# Patient Record
Sex: Female | Born: 1950
Health system: Southern US, Community
[De-identification: ages and names within clinical notes are randomized; demographics above are authoritative.]

## PROBLEM LIST (undated history)

## (undated) ENCOUNTER — Inpatient Hospital Stay: Admission: EM | Payer: Self-pay | Source: Home / Self Care

## (undated) DIAGNOSIS — E669 Obesity, unspecified: Secondary | ICD-10-CM

## (undated) DIAGNOSIS — E1140 Type 2 diabetes mellitus with diabetic neuropathy, unspecified: Secondary | ICD-10-CM

## (undated) DIAGNOSIS — F329 Major depressive disorder, single episode, unspecified: Secondary | ICD-10-CM

## (undated) DIAGNOSIS — E1142 Type 2 diabetes mellitus with diabetic polyneuropathy: Secondary | ICD-10-CM

## (undated) DIAGNOSIS — R188 Other ascites: Secondary | ICD-10-CM

## (undated) DIAGNOSIS — Z9289 Personal history of other medical treatment: Secondary | ICD-10-CM

## (undated) DIAGNOSIS — Z8719 Personal history of other diseases of the digestive system: Secondary | ICD-10-CM

## (undated) DIAGNOSIS — D649 Anemia, unspecified: Secondary | ICD-10-CM

## (undated) DIAGNOSIS — K219 Gastro-esophageal reflux disease without esophagitis: Secondary | ICD-10-CM

## (undated) DIAGNOSIS — N179 Acute kidney failure, unspecified: Secondary | ICD-10-CM

## (undated) DIAGNOSIS — E1165 Type 2 diabetes mellitus with hyperglycemia: Secondary | ICD-10-CM

## (undated) DIAGNOSIS — Z8669 Personal history of other diseases of the nervous system and sense organs: Secondary | ICD-10-CM

## (undated) DIAGNOSIS — I10 Essential (primary) hypertension: Secondary | ICD-10-CM

## (undated) DIAGNOSIS — R0600 Dyspnea, unspecified: Secondary | ICD-10-CM

## (undated) DIAGNOSIS — E785 Hyperlipidemia, unspecified: Secondary | ICD-10-CM

## (undated) DIAGNOSIS — G4700 Insomnia, unspecified: Secondary | ICD-10-CM

## (undated) HISTORY — DX: Major depressive disorder, single episode, unspecified: F32.9

## (undated) HISTORY — DX: Type 2 diabetes mellitus with hyperglycemia: E11.65

## (undated) HISTORY — PX: TUBAL LIGATION: SHX77

## (undated) HISTORY — DX: Obesity, unspecified: E66.9

## (undated) HISTORY — DX: Type 2 diabetes mellitus with diabetic neuropathy, unspecified: E11.40

## (undated) HISTORY — PX: DENTAL SURGERY: SHX609

## (undated) HISTORY — DX: Gastro-esophageal reflux disease without esophagitis: K21.9

## (undated) HISTORY — DX: Type 2 diabetes mellitus with diabetic polyneuropathy: E11.42

## (undated) HISTORY — DX: Essential (primary) hypertension: I10

## (undated) HISTORY — PX: OTHER SURGICAL HISTORY: SHX169

## (undated) HISTORY — DX: Hyperlipidemia, unspecified: E78.5

## (undated) HISTORY — DX: Insomnia, unspecified: G47.00

## (undated) HISTORY — DX: Anemia, unspecified: D64.9

---

## 1998-06-29 ENCOUNTER — Ambulatory Visit (HOSPITAL_COMMUNITY): Admission: RE | Admit: 1998-06-29 | Discharge: 1998-06-29 | Payer: Self-pay | Admitting: Gastroenterology

## 1998-08-20 ENCOUNTER — Emergency Department (HOSPITAL_COMMUNITY): Admission: EM | Admit: 1998-08-20 | Discharge: 1998-08-20 | Payer: Self-pay | Admitting: Emergency Medicine

## 2001-07-22 ENCOUNTER — Ambulatory Visit (HOSPITAL_COMMUNITY): Admission: RE | Admit: 2001-07-22 | Discharge: 2001-07-22 | Payer: Self-pay | Admitting: Family Medicine

## 2001-07-22 ENCOUNTER — Encounter: Payer: Self-pay | Admitting: Family Medicine

## 2002-10-12 ENCOUNTER — Other Ambulatory Visit: Admission: RE | Admit: 2002-10-12 | Discharge: 2002-10-12 | Payer: Self-pay | Admitting: Obstetrics and Gynecology

## 2004-06-06 ENCOUNTER — Encounter: Admission: RE | Admit: 2004-06-06 | Discharge: 2004-06-06 | Payer: Self-pay | Admitting: Internal Medicine

## 2004-06-28 HISTORY — PX: COLONOSCOPY W/ POLYPECTOMY: SHX1380

## 2004-10-05 ENCOUNTER — Ambulatory Visit: Payer: Self-pay | Admitting: Internal Medicine

## 2004-10-07 ENCOUNTER — Emergency Department (HOSPITAL_COMMUNITY): Admission: EM | Admit: 2004-10-07 | Discharge: 2004-10-07 | Payer: Self-pay | Admitting: Emergency Medicine

## 2004-11-21 ENCOUNTER — Ambulatory Visit: Payer: Self-pay | Admitting: Internal Medicine

## 2004-12-08 ENCOUNTER — Emergency Department (HOSPITAL_COMMUNITY): Admission: EM | Admit: 2004-12-08 | Discharge: 2004-12-08 | Payer: Self-pay | Admitting: *Deleted

## 2004-12-13 ENCOUNTER — Ambulatory Visit: Payer: Self-pay | Admitting: Internal Medicine

## 2004-12-19 ENCOUNTER — Ambulatory Visit: Payer: Self-pay | Admitting: Internal Medicine

## 2004-12-28 ENCOUNTER — Encounter: Admission: RE | Admit: 2004-12-28 | Discharge: 2004-12-28 | Payer: Self-pay | Admitting: Internal Medicine

## 2004-12-28 ENCOUNTER — Ambulatory Visit: Payer: Self-pay | Admitting: Internal Medicine

## 2005-02-07 ENCOUNTER — Ambulatory Visit: Payer: Self-pay | Admitting: Internal Medicine

## 2005-03-13 ENCOUNTER — Ambulatory Visit: Payer: Self-pay | Admitting: Family Medicine

## 2005-03-14 ENCOUNTER — Ambulatory Visit: Payer: Self-pay

## 2005-05-31 ENCOUNTER — Ambulatory Visit: Payer: Self-pay | Admitting: Internal Medicine

## 2005-08-30 ENCOUNTER — Ambulatory Visit: Payer: Self-pay | Admitting: Internal Medicine

## 2005-10-08 ENCOUNTER — Ambulatory Visit: Payer: Self-pay | Admitting: Internal Medicine

## 2005-10-15 ENCOUNTER — Ambulatory Visit: Payer: Self-pay | Admitting: Internal Medicine

## 2005-10-16 ENCOUNTER — Ambulatory Visit: Payer: Self-pay | Admitting: Internal Medicine

## 2005-10-30 ENCOUNTER — Other Ambulatory Visit: Admission: RE | Admit: 2005-10-30 | Discharge: 2005-10-30 | Payer: Self-pay | Admitting: Internal Medicine

## 2005-10-30 ENCOUNTER — Ambulatory Visit: Payer: Self-pay | Admitting: Internal Medicine

## 2005-10-30 ENCOUNTER — Encounter: Payer: Self-pay | Admitting: Internal Medicine

## 2006-01-21 ENCOUNTER — Ambulatory Visit: Payer: Self-pay | Admitting: Internal Medicine

## 2006-03-07 ENCOUNTER — Ambulatory Visit: Payer: Self-pay | Admitting: Internal Medicine

## 2006-04-10 ENCOUNTER — Ambulatory Visit: Payer: Self-pay | Admitting: Internal Medicine

## 2006-05-07 ENCOUNTER — Ambulatory Visit: Payer: Self-pay | Admitting: Internal Medicine

## 2006-06-18 ENCOUNTER — Ambulatory Visit: Payer: Self-pay | Admitting: Internal Medicine

## 2006-08-08 ENCOUNTER — Ambulatory Visit: Payer: Self-pay | Admitting: Internal Medicine

## 2006-09-08 ENCOUNTER — Encounter: Payer: Self-pay | Admitting: Internal Medicine

## 2006-09-09 ENCOUNTER — Ambulatory Visit: Payer: Self-pay | Admitting: Internal Medicine

## 2006-09-10 LAB — CONVERTED CEMR LAB
ALT: 20 units/L (ref 0–40)
AST: 23 units/L (ref 0–37)
BUN: 10 mg/dL (ref 6–23)
Basophils Absolute: 0.1 10*3/uL (ref 0.0–0.1)
Basophils Relative: 0.8 % (ref 0.0–1.0)
CO2: 32 meq/L (ref 19–32)
Calcium: 9.9 mg/dL (ref 8.4–10.5)
Chloride: 99 meq/L (ref 96–112)
Creatinine, Ser: 0.8 mg/dL (ref 0.4–1.2)
Creatinine,U: 182.2 mg/dL
Eosinophil percent: 1.8 % (ref 0.0–5.0)
GFR calc non Af Amer: 80 mL/min
Glomerular Filtration Rate, Af Am: 96 mL/min/{1.73_m2}
Glucose, Bld: 93 mg/dL (ref 70–99)
HCT: 33.3 % — ABNORMAL LOW (ref 36.0–46.0)
Hemoglobin: 10.8 g/dL — ABNORMAL LOW (ref 12.0–15.0)
Hgb A1c MFr Bld: 6.8 % — ABNORMAL HIGH (ref 4.6–6.0)
Lymphocytes Relative: 28.3 % (ref 12.0–46.0)
MCHC: 32.3 g/dL (ref 30.0–36.0)
MCV: 80.5 fL (ref 78.0–100.0)
Microalb Creat Ratio: 9.9 mg/g (ref 0.0–30.0)
Microalb, Ur: 1.8 mg/dL (ref 0.0–1.9)
Monocytes Absolute: 0.5 10*3/uL (ref 0.2–0.7)
Monocytes Relative: 4.5 % (ref 3.0–11.0)
Neutro Abs: 6.7 10*3/uL (ref 1.4–7.7)
Neutrophils Relative %: 64.6 % (ref 43.0–77.0)
Platelets: 415 10*3/uL — ABNORMAL HIGH (ref 150–400)
Potassium: 3.3 meq/L — ABNORMAL LOW (ref 3.5–5.1)
RBC: 4.14 M/uL (ref 3.87–5.11)
RDW: 14.8 % — ABNORMAL HIGH (ref 11.5–14.6)
Sodium: 139 meq/L (ref 135–145)
WBC: 10.5 10*3/uL (ref 4.5–10.5)

## 2007-06-01 ENCOUNTER — Telehealth: Payer: Self-pay | Admitting: Internal Medicine

## 2007-06-30 DIAGNOSIS — E1169 Type 2 diabetes mellitus with other specified complication: Secondary | ICD-10-CM | POA: Insufficient documentation

## 2007-06-30 DIAGNOSIS — F331 Major depressive disorder, recurrent, moderate: Secondary | ICD-10-CM

## 2007-06-30 DIAGNOSIS — I10 Essential (primary) hypertension: Secondary | ICD-10-CM

## 2007-06-30 DIAGNOSIS — E1165 Type 2 diabetes mellitus with hyperglycemia: Secondary | ICD-10-CM

## 2007-06-30 DIAGNOSIS — E1140 Type 2 diabetes mellitus with diabetic neuropathy, unspecified: Secondary | ICD-10-CM

## 2007-06-30 DIAGNOSIS — E785 Hyperlipidemia, unspecified: Secondary | ICD-10-CM

## 2007-06-30 DIAGNOSIS — K219 Gastro-esophageal reflux disease without esophagitis: Secondary | ICD-10-CM

## 2007-06-30 DIAGNOSIS — E1149 Type 2 diabetes mellitus with other diabetic neurological complication: Secondary | ICD-10-CM | POA: Insufficient documentation

## 2007-06-30 DIAGNOSIS — F325 Major depressive disorder, single episode, in full remission: Secondary | ICD-10-CM | POA: Insufficient documentation

## 2007-06-30 HISTORY — DX: Essential (primary) hypertension: I10

## 2007-06-30 HISTORY — DX: Type 2 diabetes mellitus with other diabetic neurological complication: E11.49

## 2007-06-30 HISTORY — DX: Major depressive disorder, recurrent, moderate: F33.1

## 2007-06-30 HISTORY — DX: Hyperlipidemia, unspecified: E78.5

## 2007-06-30 HISTORY — DX: Gastro-esophageal reflux disease without esophagitis: K21.9

## 2007-07-06 ENCOUNTER — Telehealth (INDEPENDENT_AMBULATORY_CARE_PROVIDER_SITE_OTHER): Payer: Self-pay | Admitting: *Deleted

## 2007-07-07 ENCOUNTER — Telehealth: Payer: Self-pay | Admitting: Internal Medicine

## 2007-07-08 ENCOUNTER — Ambulatory Visit: Payer: Self-pay | Admitting: Internal Medicine

## 2007-09-02 ENCOUNTER — Telehealth: Payer: Self-pay | Admitting: Internal Medicine

## 2007-10-12 ENCOUNTER — Telehealth: Payer: Self-pay | Admitting: Internal Medicine

## 2007-10-15 ENCOUNTER — Ambulatory Visit: Payer: Self-pay | Admitting: Internal Medicine

## 2007-12-02 ENCOUNTER — Telehealth: Payer: Self-pay | Admitting: Internal Medicine

## 2007-12-03 ENCOUNTER — Telehealth: Payer: Self-pay | Admitting: Internal Medicine

## 2007-12-04 ENCOUNTER — Ambulatory Visit: Payer: Self-pay | Admitting: Internal Medicine

## 2007-12-04 DIAGNOSIS — F411 Generalized anxiety disorder: Secondary | ICD-10-CM | POA: Insufficient documentation

## 2007-12-04 LAB — CONVERTED CEMR LAB
Glucose, Bld: 168 mg/dL
Hemoglobin: 12 g/dL

## 2007-12-17 ENCOUNTER — Telehealth: Payer: Self-pay | Admitting: Internal Medicine

## 2007-12-18 ENCOUNTER — Emergency Department (HOSPITAL_COMMUNITY): Admission: EM | Admit: 2007-12-18 | Discharge: 2007-12-18 | Payer: Self-pay | Admitting: Family Medicine

## 2008-02-17 ENCOUNTER — Telehealth: Payer: Self-pay | Admitting: Internal Medicine

## 2008-02-23 ENCOUNTER — Telehealth: Payer: Self-pay | Admitting: Internal Medicine

## 2008-04-27 ENCOUNTER — Telehealth: Payer: Self-pay | Admitting: Internal Medicine

## 2008-05-26 ENCOUNTER — Telehealth: Payer: Self-pay | Admitting: Internal Medicine

## 2008-05-30 ENCOUNTER — Telehealth: Payer: Self-pay | Admitting: Internal Medicine

## 2008-06-22 ENCOUNTER — Telehealth: Payer: Self-pay | Admitting: *Deleted

## 2008-06-28 ENCOUNTER — Ambulatory Visit: Payer: Self-pay | Admitting: Internal Medicine

## 2008-07-15 ENCOUNTER — Telehealth (INDEPENDENT_AMBULATORY_CARE_PROVIDER_SITE_OTHER): Payer: Self-pay | Admitting: *Deleted

## 2008-08-01 ENCOUNTER — Telehealth: Payer: Self-pay | Admitting: Internal Medicine

## 2008-08-08 ENCOUNTER — Ambulatory Visit: Payer: Self-pay | Admitting: Internal Medicine

## 2008-08-08 ENCOUNTER — Encounter (INDEPENDENT_AMBULATORY_CARE_PROVIDER_SITE_OTHER): Payer: Self-pay | Admitting: *Deleted

## 2008-08-12 ENCOUNTER — Telehealth (INDEPENDENT_AMBULATORY_CARE_PROVIDER_SITE_OTHER): Payer: Self-pay | Admitting: *Deleted

## 2008-09-15 ENCOUNTER — Telehealth: Payer: Self-pay | Admitting: *Deleted

## 2008-10-26 ENCOUNTER — Telehealth: Payer: Self-pay | Admitting: Internal Medicine

## 2008-11-18 ENCOUNTER — Encounter (INDEPENDENT_AMBULATORY_CARE_PROVIDER_SITE_OTHER): Payer: Self-pay | Admitting: Internal Medicine

## 2008-11-18 ENCOUNTER — Ambulatory Visit: Payer: Self-pay | Admitting: Internal Medicine

## 2008-11-18 LAB — CONVERTED CEMR LAB
ALT: 20 units/L (ref 0–35)
AST: 19 units/L (ref 0–37)
Albumin: 4.1 g/dL (ref 3.5–5.2)
Alkaline Phosphatase: 131 units/L — ABNORMAL HIGH (ref 39–117)
BUN: 16 mg/dL (ref 6–23)
Bilirubin Urine: NEGATIVE
Blood Glucose, Fingerstick: 136
CO2: 27 meq/L (ref 19–32)
Calcium: 9.6 mg/dL (ref 8.4–10.5)
Chlamydia, DNA Probe: NEGATIVE
Chloride: 102 meq/L (ref 96–112)
Cholesterol: 176 mg/dL (ref 0–200)
Creatinine, Ser: 0.87 mg/dL (ref 0.40–1.20)
Creatinine, Urine: 209.2 mg/dL
GC Probe Amp, Genital: NEGATIVE
Glucose, Bld: 108 mg/dL — ABNORMAL HIGH (ref 70–99)
HDL: 63 mg/dL (ref >39)
Hemoglobin, Urine: NEGATIVE
Hgb A1c MFr Bld: 7.1 %
Ketones, ur: NEGATIVE mg/dL
LDL Cholesterol: 91 mg/dL (ref 0–99)
Leukocytes, UA: NEGATIVE
Microalb Creat Ratio: 13 mg/g (ref 0.0–30.0)
Microalb, Ur: 2.73 mg/dL — ABNORMAL HIGH (ref 0.00–1.89)
Nitrite: NEGATIVE
Potassium: 4.2 meq/L (ref 3.5–5.3)
Protein, ur: NEGATIVE mg/dL
RBC / HPF: NONE SEEN (ref <3)
Sodium: 144 meq/L (ref 135–145)
Specific Gravity, Urine: 1.03 (ref 1.005–1.03)
Total Bilirubin: 0.3 mg/dL (ref 0.3–1.2)
Total CHOL/HDL Ratio: 2.8
Total Protein: 7.4 g/dL (ref 6.0–8.3)
Triglycerides: 111 mg/dL (ref <150)
Urine Glucose: NEGATIVE mg/dL
Urobilinogen, UA: 0.2 (ref 0.0–1.0)
VLDL: 22 mg/dL (ref 0–40)
WBC, UA: NONE SEEN cells/hpf (ref <3)
pH: 5.5 (ref 5.0–8.0)

## 2008-11-21 LAB — CONVERTED CEMR LAB
Candida species: POSITIVE — AB
Gardnerella vaginalis: NEGATIVE
Trichomonal Vaginitis: NEGATIVE

## 2008-12-14 ENCOUNTER — Ambulatory Visit: Payer: Self-pay | Admitting: Internal Medicine

## 2008-12-14 LAB — CONVERTED CEMR LAB

## 2008-12-16 ENCOUNTER — Ambulatory Visit (HOSPITAL_COMMUNITY): Admission: RE | Admit: 2008-12-16 | Discharge: 2008-12-16 | Payer: Self-pay | Admitting: Internal Medicine

## 2008-12-16 LAB — HM MAMMOGRAPHY: HM Mammogram: NEGATIVE

## 2009-01-13 ENCOUNTER — Telehealth: Payer: Self-pay | Admitting: Internal Medicine

## 2009-01-13 ENCOUNTER — Ambulatory Visit: Payer: Self-pay | Admitting: Internal Medicine

## 2009-02-16 ENCOUNTER — Telehealth (INDEPENDENT_AMBULATORY_CARE_PROVIDER_SITE_OTHER): Payer: Self-pay | Admitting: *Deleted

## 2009-05-03 ENCOUNTER — Telehealth: Payer: Self-pay | Admitting: *Deleted

## 2009-05-05 ENCOUNTER — Telehealth: Payer: Self-pay | Admitting: *Deleted

## 2009-06-01 ENCOUNTER — Telehealth: Payer: Self-pay | Admitting: *Deleted

## 2009-06-16 ENCOUNTER — Ambulatory Visit: Payer: Self-pay | Admitting: Internal Medicine

## 2009-06-16 LAB — CONVERTED CEMR LAB
BUN: 19 mg/dL (ref 6–23)
Blood Glucose, Fingerstick: 140
CO2: 27 meq/L (ref 19–32)
Calcium: 9.3 mg/dL (ref 8.4–10.5)
Chloride: 98 meq/L (ref 96–112)
Cholesterol: 207 mg/dL — ABNORMAL HIGH (ref 0–200)
Creatinine, Ser: 0.75 mg/dL (ref 0.40–1.20)
Glucose, Bld: 105 mg/dL — ABNORMAL HIGH (ref 70–99)
HDL: 60 mg/dL (ref >39)
Hgb A1c MFr Bld: 6.5 %
LDL Cholesterol: 124 mg/dL — ABNORMAL HIGH (ref 0–99)
Potassium: 4 meq/L (ref 3.5–5.3)
Sodium: 139 meq/L (ref 135–145)
Total CHOL/HDL Ratio: 3.5
Triglycerides: 113 mg/dL (ref <150)
VLDL: 23 mg/dL (ref 0–40)

## 2009-06-19 ENCOUNTER — Telehealth (INDEPENDENT_AMBULATORY_CARE_PROVIDER_SITE_OTHER): Payer: Self-pay | Admitting: Internal Medicine

## 2009-08-08 ENCOUNTER — Telehealth: Payer: Self-pay | Admitting: *Deleted

## 2009-09-05 ENCOUNTER — Telehealth: Payer: Self-pay | Admitting: *Deleted

## 2009-09-05 ENCOUNTER — Telehealth (INDEPENDENT_AMBULATORY_CARE_PROVIDER_SITE_OTHER): Payer: Self-pay | Admitting: *Deleted

## 2009-09-11 ENCOUNTER — Telehealth: Payer: Self-pay | Admitting: *Deleted

## 2009-10-17 ENCOUNTER — Ambulatory Visit: Payer: Self-pay | Admitting: Internal Medicine

## 2009-11-03 ENCOUNTER — Encounter (INDEPENDENT_AMBULATORY_CARE_PROVIDER_SITE_OTHER): Payer: Self-pay | Admitting: Internal Medicine

## 2009-11-22 ENCOUNTER — Encounter (INDEPENDENT_AMBULATORY_CARE_PROVIDER_SITE_OTHER): Payer: Self-pay | Admitting: Internal Medicine

## 2009-11-22 LAB — HM DIABETES EYE EXAM

## 2009-12-22 ENCOUNTER — Telehealth (INDEPENDENT_AMBULATORY_CARE_PROVIDER_SITE_OTHER): Payer: Self-pay | Admitting: Internal Medicine

## 2009-12-28 ENCOUNTER — Ambulatory Visit: Payer: Self-pay | Admitting: Internal Medicine

## 2009-12-28 DIAGNOSIS — E1142 Type 2 diabetes mellitus with diabetic polyneuropathy: Secondary | ICD-10-CM

## 2009-12-28 HISTORY — DX: Type 2 diabetes mellitus with diabetic polyneuropathy: E11.42

## 2009-12-28 LAB — CONVERTED CEMR LAB
Blood Glucose, Fingerstick: 86
Hgb A1c MFr Bld: 6.7 %
Pap Smear: NEGATIVE

## 2009-12-28 LAB — HM PAP SMEAR: HM Pap smear: NEGATIVE

## 2010-01-09 LAB — CONVERTED CEMR LAB
ALT: 23 units/L (ref 0–35)
AST: 22 units/L (ref 0–37)
Albumin: 4 g/dL (ref 3.5–5.2)
Alkaline Phosphatase: 147 units/L — ABNORMAL HIGH (ref 39–117)
BUN: 19 mg/dL (ref 6–23)
CO2: 29 meq/L (ref 19–32)
Calcium: 9.6 mg/dL (ref 8.4–10.5)
Chloride: 100 meq/L (ref 96–112)
Cholesterol: 163 mg/dL (ref 0–200)
Creatinine, Ser: 0.68 mg/dL (ref 0.40–1.20)
Glucose, Bld: 79 mg/dL (ref 70–99)
HDL: 53 mg/dL (ref >39)
LDL Cholesterol: 86 mg/dL (ref 0–99)
Potassium: 4.4 meq/L (ref 3.5–5.3)
Sodium: 140 meq/L (ref 135–145)
TSH: 3.253 microintl units/mL (ref 0.350–4.5)
Total Bilirubin: 0.3 mg/dL (ref 0.3–1.2)
Total CHOL/HDL Ratio: 3.1
Total Protein: 6.9 g/dL (ref 6.0–8.3)
Triglycerides: 121 mg/dL (ref <150)
VLDL: 24 mg/dL (ref 0–40)
Vitamin B-12: 518 pg/mL (ref 211–911)

## 2010-01-10 ENCOUNTER — Telehealth (INDEPENDENT_AMBULATORY_CARE_PROVIDER_SITE_OTHER): Payer: Self-pay | Admitting: Internal Medicine

## 2010-01-11 ENCOUNTER — Ambulatory Visit: Payer: Self-pay | Admitting: Internal Medicine

## 2010-01-11 ENCOUNTER — Encounter (INDEPENDENT_AMBULATORY_CARE_PROVIDER_SITE_OTHER): Payer: Self-pay | Admitting: Internal Medicine

## 2010-01-12 LAB — CONVERTED CEMR LAB: GGT: 22 units/L (ref 7–51)

## 2010-02-21 ENCOUNTER — Telehealth (INDEPENDENT_AMBULATORY_CARE_PROVIDER_SITE_OTHER): Payer: Self-pay | Admitting: Internal Medicine

## 2010-02-22 ENCOUNTER — Encounter (INDEPENDENT_AMBULATORY_CARE_PROVIDER_SITE_OTHER): Payer: Self-pay | Admitting: Internal Medicine

## 2010-02-22 ENCOUNTER — Ambulatory Visit: Payer: Self-pay | Admitting: Internal Medicine

## 2010-02-22 LAB — CONVERTED CEMR LAB
BUN: 24 mg/dL — ABNORMAL HIGH (ref 6–23)
Blood Glucose, Fingerstick: 111
CO2: 27 meq/L (ref 19–32)
Calcium: 9.7 mg/dL (ref 8.4–10.5)
Chloride: 100 meq/L (ref 96–112)
Creatinine, Ser: 0.86 mg/dL (ref 0.40–1.20)
Glucose, Bld: 90 mg/dL (ref 70–99)
Potassium: 3.5 meq/L (ref 3.5–5.3)
Sodium: 138 meq/L (ref 135–145)

## 2010-02-26 ENCOUNTER — Telehealth: Payer: Self-pay | Admitting: *Deleted

## 2010-03-21 ENCOUNTER — Telehealth: Payer: Self-pay | Admitting: *Deleted

## 2010-03-29 ENCOUNTER — Encounter: Payer: Self-pay | Admitting: Internal Medicine

## 2010-03-29 ENCOUNTER — Ambulatory Visit: Payer: Self-pay | Admitting: Internal Medicine

## 2010-03-29 LAB — CONVERTED CEMR LAB
Blood Glucose, Fingerstick: 114
Hgb A1c MFr Bld: 6.3 %

## 2010-04-02 LAB — CONVERTED CEMR LAB
BUN: 19 mg/dL (ref 6–23)
CO2: 34 meq/L — ABNORMAL HIGH (ref 19–32)
Calcium, Total (PTH): 9.7 mg/dL (ref 8.4–10.5)
Calcium: 9.8 mg/dL (ref 8.4–10.5)
Chloride: 99 meq/L (ref 96–112)
Creatinine, Ser: 0.63 mg/dL (ref 0.40–1.20)
Glucose, Bld: 97 mg/dL (ref 70–99)
PTH: 90 pg/mL — ABNORMAL HIGH (ref 14.0–72.0)
Potassium: 3.7 meq/L (ref 3.5–5.3)
Sodium: 138 meq/L (ref 135–145)

## 2010-09-14 ENCOUNTER — Telehealth: Payer: Self-pay | Admitting: Internal Medicine

## 2010-11-09 ENCOUNTER — Ambulatory Visit: Admission: RE | Admit: 2010-11-09 | Discharge: 2010-11-09 | Payer: Self-pay | Source: Home / Self Care

## 2010-11-09 LAB — CONVERTED CEMR LAB
Blood Glucose, Fingerstick: 120
Hgb A1c MFr Bld: 6.5 %

## 2010-11-09 LAB — HM DIABETES FOOT EXAM

## 2010-11-12 LAB — GLUCOSE, CAPILLARY: Glucose-Capillary: 120 mg/dL — ABNORMAL HIGH (ref 70–99)

## 2010-11-17 ENCOUNTER — Encounter: Payer: Self-pay | Admitting: Internal Medicine

## 2010-11-18 ENCOUNTER — Encounter: Payer: Self-pay | Admitting: Internal Medicine

## 2010-11-25 LAB — CONVERTED CEMR LAB
Calcium, Total (PTH): 9.5 mg/dL (ref 8.4–10.5)
PTH: 139.9 pg/mL — ABNORMAL HIGH (ref 14.0–72.0)
Phosphorus: 3.1 mg/dL (ref 2.3–4.6)
Vit D, 25-Hydroxy: 35 ng/mL (ref 30–89)

## 2010-11-29 NOTE — Consult Note (Signed)
Summary: [facility] 2010: Diabetic Eye Exam  [facility] [employer] 2010: Diabetic Eye Exam   Imported By: Florinda Marker 01/17/2010 16:51:50  _____________________________________________________________________  External Attachment:    Type:   Image     Comment:   External Document  Appended Document: [facility] [employer] 2010: Diabetic Eye Exam    Clinical Lists Changes  Observations: Added new observation of [id number]: 11/2010 (01/18/2010 8:15) Added new observation of DIAB EYE EX: No diabetic retinopathy.    (11/22/2009 8:15)       Diabetic Eye Exam  Procedure date:  11/22/2009  Findings:      No diabetic retinopathy.     Procedures Next Due Date:    Diabetic Eye Exam: 11/2010   Diabetic Eye Exam  Procedure date:  11/22/2009  Findings:      No diabetic retinopathy.     Procedures Next Due Date:    Diabetic Eye Exam: 11/2010

## 2010-11-29 NOTE — Progress Notes (Signed)
Summary: diarrhea/ hla  Phone Note Call from Patient   Summary of Call: pt calls c/o diarrhea, tiredness since 4/24, is taking gatorade and gingerale, has no appetite. used immodium, slightly better. nothing seems to aggravate it. desires appt, given for 4/28 Initial call taken by: Marin Roberts RN,  February 21, 2010 1:38 PM  Follow-up for Phone Call        thank you. Follow-up by: Joaquin Courts  MD,  February 21, 2010 4:47 PM

## 2010-11-29 NOTE — Progress Notes (Signed)
Summary: phone/gg  Phone Note Call from Patient   Summary of Call: Pt called and stated today she  took metformin 1000 mg at 0800 and again 1100. CBG at  0700 was 145 and rechecked at 1000 was 120.  SHe has had lunch but feels nauseated. Pt # [id number]  ex 327  ( cell [phone]) Initial call taken by: Merrie Roof RN,  January 10, 2010 3:53 PM  Follow-up for Phone Call        Tell her not to take her evening dose and to check her sugars if she feels like she is having a low.  Thank you. Follow-up by: Joaquin Courts  MD,  January 10, 2010 1:49 PM  Additional Follow-up for Phone Call Additional follow up Details #1::        Pt informed and voices understanding Additional Follow-up by: Merrie Roof RN,  January 10, 2010 3:54 PM

## 2010-11-29 NOTE — Progress Notes (Signed)
Summary: Refill/gh  Phone Note Refill Request Message from:  Patient on September 14, 2010 4:56 PM  Refills Requested: Medication #1:  PRAVACHOL 40 MG TABS Take 1 tablet by mouth once a day for cholesterol Last visit and labs were 03/29/2010.   Method Requested: Electronic Initial call taken by: Angelina Ok RN,  September 14, 2010 4:56 PM  Follow-up for Phone Call        Last seen 03/29/10 and requested 3 month F/U. No appt given. I sent a flag to Ms Lissa Hoard to schedule an appt.  Follow-up by: Blanch Media MD,  September 14, 2010 5:29 PM    Prescriptions: PRAVACHOL 40 MG TABS (PRAVASTATIN SODIUM) Take 1 tablet by mouth once a day for cholesterol  #30 Each x 2   Entered and Authorized by:   Blanch Media MD   Signed by:   Blanch Media MD on 09/14/2010   Method used:   Electronically to        [facility]  [phone]* (retail)       [address]       [place], [place]  [address]       Ph: [phone] or [phone]       Fax: [phone]   RxID:   [id number]

## 2010-11-29 NOTE — Assessment & Plan Note (Signed)
Summary: EST-CK/FU/MEDS/CFB   Vital Signs:  Patient profile:   60 year old female Height:      64.25 inches (163.19 cm) Weight:      222.9 pounds (101.32 kg) BMI:     38.10 Temp:     98.2 degrees F oral Pulse rate:   78 / minute BP sitting:   181 / 84  (right arm) Cuff size:   large  Vitals Entered By: Chinita Pester RN (November 09, 2010 11:58 AM)  Nutrition Counseling: Patient's BMI is greater than 25 and therefore counseled on weight management options. CC: Check-up; med refills. Is Patient Diabetic? Yes Did you bring your meter with you today? No Pain Assessment Patient in pain? no      Nutritional Status BMI of > 30 = obese CBG Result 120  Have you ever been in a relationship where you felt threatened, hurt or afraid?No   Does patient need assistance? Functional Status Self care Ambulation Normal   Diabetic Foot Exam Last Podiatry Exam Date: 11/09/2010  Foot Inspection Is there a history of a foot ulcer?              No Is there a foot ulcer now?              No Is there swelling or an abnormal foot shape?          No Are the toenails long?                No Are the toenails thick?                No Are the toenails ingrown?              No Is there heavy callous build-up?              No Is there pain in the calf muscle (Intermittent claudication) when walking?    NoIs there a claw toe deformity?              No Is there elevated skin temperature?            No Is there limited ankle dorsiflexion?            No Is there foot or ankle muscle weakness?            No  Diabetic Foot Care Education Patient educated on appropriate care of diabetic feet.  Pulse Check          Right Foot          Left Foot Posterior Tibial:        normal            normal Dorsalis Pedis:        normal            normal  High Risk Feet? No Set Next Diabetic Foot Exam here: 11/11/2011   10-g (5.07) Semmes-Weinstein Monofilament Test           Right Foot          Left  Foot Visual Inspection               Test Control      normal         normal Site 1         normal         normal Site 2         normal  normal Site 3         normal         normal Site 4         normal         normal Site 5         normal         normal Site 6         normal         normal Site 7         normal         normal Site 8         normal         normal Site 9         normal         normal Site 10         normal         normal  Impression      normal         normal  Legend:  Site 1 = Plantar aspect of first toe (center of pad) Site 2 = Plantar aspect of third toe (center of pad) Site 3 = Plantar aspect of fifth toe (center of pad) Site 4 = Plantar aspect of first metatarsal head Site 5 = Plantar aspect of third metatarsal head Site 6 = Plantar aspect of fifth metatarsal head Site 7 = Plantar aspect of medial midfoot Site 8 = Plantar aspect of lateral midfoot Site 9 = Plantar aspect of heel Site 10 = dorsal aspect of foot between the base of the first and second toes   Result is Abnormal if patient was unable to perceive the monofilament at site indicated.    Primary Care Provider:  Joaquin Courts  MD  CC:  Check-up; med refills..  History of Present Illness: Follow up appointment. Rf on her meds.  Depression History:      The patient denies a depressed mood most of the day and a diminished interest in her usual daily activities.        Comments:  "But I think it has started back;I've been crying more lately.".   Preventive Screening-Counseling & Management  Alcohol-Tobacco     Alcohol drinks/day: 0     Smoking Status: quit  Caffeine-Diet-Exercise     Does Patient Exercise: no  Current Problems (verified): 1)  Special Screening For Malignant Neoplasms Colon  (ICD-V76.51) 2)  Overweight  (ICD-278.02) 3)  ? of Primary Hyperparathyroidism  (ICD-252.01) 4)  Alkaline Phosphatase, Elevated  (ICD-790.5) 5)  Routine Gynecological Examination   (ICD-V72.31) 6)  Peripheral Neuropathy  (ICD-356.9) 7)  Preventive Health Care  (ICD-V70.0) 8)  Anxiety State, Unspecified  (ICD-300.00) 9)  Hypertension  (ICD-401.9) 10)  Hyperlipidemia  (ICD-272.4) 11)  Gerd  (ICD-530.81) 12)  Diabetes Mellitus, Type II  (ICD-250.00) 13)  Depression  (ICD-311)  Current Medications (verified): 1)  Aurora Lancet Super Thin 30g  Misc (Lancets) .... Use 1 Needle As Directed Once A Day 2)  Losartan Potassium 25 Mg Tabs (Losartan Potassium) .... Take 1 Tablet By Mouth Once A Day 3)  Glucophage 1000 Mg Tabs (Metformin Hcl) .... Take 1 Tablet By Mouth Two Times A Day 4)  Pravachol 40 Mg Tabs (Pravastatin Sodium) .... Take 1 Tablet By Mouth Once A Day For Cholesterol 5)  Anacin 81 Mg Tbec (Aspirin) 6)  Citalopram Hydrobromide 20 Mg Tabs (Citalopram Hydrobromide) .... Take 1 Tablet By Mouth Once A Day. 7)  Furosemide  20 Mg Tabs (Furosemide) .... Take 1 Tablet By Mouth Once A Day  Allergies (verified): 1)  ! Codeine Sulfate (Codeine Sulfate) 2)  ! Penicillin 3)  ! Lisinopril  Past History:  Past medical, surgical, family and social histories (including risk factors) reviewed, and no changes noted (except as noted below).  Past Medical History: Reviewed history from 06/28/2008 and no changes required. Depression  Diabetes mellitus, type II GERD Hyperlipidemia Hypertension    Past Surgical History: Reviewed history from 11/18/2008 and no changes required. Colon polypectomy 9/05 BTL  Family History: Reviewed history from 10/15/2007 and no changes required. dementia in mom  Social History: Reviewed history from 11/18/2008 and no changes required. She is still caretaking her elderly mother who has dementia and works part time at [employer].  Currently no health insurance. 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  Head:  normocephalic and atraumatic.   Ears:  pierced.  Nose:   no external deformity.   Mouth:  pharynx pink and moist, no erythema, no exudates, and no lesions.   Neck:  no LAD, no JVD.  Lungs:  normal respiratory effort, no intercostal retractions, no accessory muscle use.  CTAB Heart:  normal rate, regular rhythm, SEM I/VI at sternal border. Abdomen:  obese, soft, NT, and ND.  Msk:  No deformity or scoliosis noted of thoracic or lumbar spine.   Pulses:  R and L dorsalis pedis and posterior tibial pulses are full and equal bilaterally Extremities:  no peripheral edema. Neurologic:  No cranial nerve deficits noted. Station and gait are normal. Plantar reflexes are down-going bilaterally. DTRs are symmetrical throughout. Sensory, motor and coordinative functions appear intact.  Diabetes Management Exam:    Foot Exam (with socks and/or shoes not present):       Sensory-Monofilament:          Left foot: normal          Right foot: normal   Impression & Recommendations:  Problem # 1:  DIABETES MELLITUS, TYPE II (ICD-250.00) Assessment Unchanged Excellent cotnroll. will decrease Metform from 100 mg by mouth two times a day down to 500 mg Po two times a day; referred to Ms.  Victory Dakin for diet managment. Her updated medication list for this problem includes:    Losartan Potassium 25 Mg Tabs (Losartan potassium) .[name]... Take 1 tablet by mouth once a day    Glucophage 1000 Mg Tabs (Metformin hcl) .[name]... Take half of one tablet by mouth two times a day    Anacin 81 Mg Tbec (Aspirin)  Orders: T- Capillary Blood Glucose (16109) T-Hgb A1C (in-house) (60454UJ) DME Referral (DME) Ophthalmology Referral (Ophthalmology)  Labs Reviewed: Creat: 0.63 (03/29/2010)     Last Eye Exam: No diabetic retinopathy.    (11/22/2009) Reviewed HgBA1c results: 6.5 (11/09/2010)  6.3 (03/29/2010)  Her updated medication list for this problem includes:    Losartan Potassium 25 Mg Tabs (Losartan potassium) .[name]... Take 1 tablet by mouth once a day    Glucophage 1000 Mg Tabs  (Metformin hcl) .[name]... Take 1 tablet by mouth two times a day    Anacin 81 Mg Tbec (Aspirin)  Problem # 2:  HYPERTENSION (ICD-401.9) Assessment: Deteriorated  patient did not take her meds this am. HTN, etiology, treatment options, risks discussed with the patient. Strongly advised to adhere with a Tx regimen. will recheck BP in 1 week or sooner. Her updated medication list for this problem  includes:    Losartan Potassium 25 Mg Tabs (Losartan potassium) .[name]... Take 1 tablet by mouth once a day    Furosemide 20 Mg Tabs (Furosemide) .[name]... Take 1 tablet by mouth once a day  Orders: Ophthalmology Referral (Ophthalmology)  BP today: 181/84 Prior BP: 120/73 (03/29/2010)  Labs Reviewed: K+: 3.7 (03/29/2010) Creat: : 0.63 (03/29/2010)   Chol: 163 (12/28/2009)   HDL: 53 (12/28/2009)   LDL: 86 (12/28/2009)   TG: 121 (12/28/2009)  Problem # 3:  DEPRESSION (ICD-311) Assessment: Unchanged Denies SI/HI or mania. Tx options reviewed with the patient. Her updated medication list for this problem includes:    Citalopram Hydrobromide 20 Mg Tabs (Citalopram hydrobromide) .[name]... Take 1 tablet by mouth once a day.  Discussed treatment options, including trial of antidpressant medication. Will refer to behavioral health. Follow-up call in in 24-48 hours and recheck in 2 weeks, sooner as needed. Patient agrees to call if any worsening of symptoms or thoughts of doing harm arise. Verified that the patient has no suicidal ideation at this time.   Complete Medication List: 1)  Aurora Lancet Super Thin 30g Misc (Lancets) .... Use 1 needle as directed once a day 2)  Losartan Potassium 25 Mg Tabs (Losartan potassium) .... Take 1 tablet by mouth once a day 3)  Glucophage 1000 Mg Tabs (Metformin hcl) .... Take half of one tablet by mouth two times a day 4)  Pravachol 40 Mg Tabs (Pravastatin sodium) .... Take 1 tablet by mouth once a day for cholesterol 5)  Anacin 81 Mg Tbec (Aspirin) 6)  Citalopram Hydrobromide 20  Mg Tabs (Citalopram hydrobromide) .... Take 1 tablet by mouth once a day. 7)  Furosemide 20 Mg Tabs (Furosemide) .... Take 1 tablet by mouth once a day  Other Orders: T-Hemoccult Cards-Multiple (82270) Mammogram (Screening) (Mammo)  Patient Instructions: 1)  Please, take ALL your medications as prescribed. 2)  Please, call with any questions. 3)  Please, return to clinic in 1 week for a blood pressure recheck. 4)  If you start feeling dizzy, develop speech or visual deficits or weakness, call 911 or go to ED immediately. 5)  Pelase, make an appointment with Ms. Victory Dakin for a nutrition class. Prescriptions: GLUCOPHAGE 1000 MG TABS (METFORMIN HCL) Take half of one tablet by mouth two times a day  #30 x 11   Entered and Authorized by:   Deatra Robinson MD   Signed by:   Deatra Robinson MD on 11/12/2010   Method used:   Historical   RxID:   [id number] LOSARTAN POTASSIUM 25 MG TABS (LOSARTAN POTASSIUM) Take 1 tablet by mouth once a day  #30 x 6   Entered and Authorized by:   Deatra Robinson MD   Signed by:   Deatra Robinson MD on 11/09/2010   Method used:   Electronically to        [facility]  [phone]* (retail)       [address]       [place], [place]  [address]       Ph: [phone] or [phone]       Fax: [phone]   RxID:   [id number] CITALOPRAM HYDROBROMIDE 20 MG TABS (CITALOPRAM HYDROBROMIDE) Take 1 tablet by mouth once a day.  #30 x 6   Entered and Authorized by:   Deatra Robinson MD   Signed by:   Deatra Robinson MD on 11/09/2010   Method used:   Electronically to  [facility]  [address]  [phone]* (retail)       [address]       [place], [place]  [address]       Ph: [phone] or [phone]       Fax: [phone]   RxID:   [id number] FUROSEMIDE 20 MG TABS (FUROSEMIDE) Take 1 tablet by mouth once a day  #30 Each x 6   Entered and Authorized by:   Deatra Robinson MD   Signed by:    Deatra Robinson MD on 11/09/2010   Method used:   Electronically to        [facility]  [phone]* (retail)       [address]       [place], [place]  [address]       Ph: [phone] or [phone]       Fax: [phone]   RxID:   [id number]    Orders Added: 1)  T- Capillary Blood Glucose [82948] 2)  T-Hgb A1C (in-house) [83036QW] 3)  Est. Patient Level III [27035] 4)  DME Referral [DME] 5)  Ophthalmology Referral [Ophthalmology] 6)  T-Hemoccult Cards-Multiple [82270] 7)  Mammogram (Screening) [Mammo]    Prevention & Chronic Care Immunizations   Influenza vaccine: Fluvax Non-MCR  (10/17/2009)   Influenza vaccine due: 06/28/2012    Tetanus booster: Not documented   Td booster deferral: Deferred  (03/29/2010)   Tetanus booster due: 11/09/2020    Pneumococcal vaccine: Not documented   Pneumococcal vaccine due: 06/05/2018  Colorectal Screening   Hemoccult: Not documented   Hemoccult action/deferral: Refused  (11/09/2010)    Colonoscopy: Not documented   Colonoscopy action/deferral: GI referral  (11/09/2010)  Other Screening   Pap smear: NEGATIVE FOR INTRAEPITHELIAL LESIONS OR MALIGNANCY.  (12/28/2009)   Pap smear action/deferral: Ordered  (12/28/2009)   Pap smear due: 11/09/2012    Mammogram: ASSESSMENT: Negative - BI-RADS 1^MS DIGITAL SCREENING  (12/16/2008)   Mammogram action/deferral: Ordered  (03/29/2010)   Mammogram due: 12/16/2010  Reports requested:   Last mammogram report requested.  Smoking status: quit  (11/09/2010)  Diabetes Mellitus   HgbA1C: 6.5  (11/09/2010)   Hemoglobin A1C due: 11/10/2011    Eye exam: No diabetic retinopathy.     (11/22/2009)   Diabetic eye exam action/deferral: Ophthalmology referral  (11/09/2010)   Eye exam due: 11/2010    Foot exam: yes  (11/09/2010)   Foot exam action/deferral: Do today   High risk foot: No  (11/09/2010)   Foot care education: Done  (11/09/2010)   Foot exam  due: 11/11/2011    Urine microalbumin/creatinine ratio: 13.0  (11/18/2008)   Urine microalbumin action/deferral: Not indicated    Diabetes flowsheet reviewed?: Yes   Progress toward A1C goal: Unchanged    Stage of readiness to change (diabetes management): Maintenance  Lipids   Total Cholesterol: 163  (12/28/2009)   Lipid panel action/deferral: Lipid Panel ordered   LDL: 86  (12/28/2009)   LDL Direct: Not documented   HDL: 53  (12/28/2009)   Triglycerides: 121  (12/28/2009)   Lipid panel due: 12/29/2010    SGOT (AST): 22  (12/28/2009)   SGPT (ALT): 23  (12/28/2009)   Alkaline phosphatase: 147  (12/28/2009)   Total bilirubin: 0.3  (12/28/2009)   Liver panel due: 12/29/2010    Lipid flowsheet reviewed?: Yes   Progress toward LDL goal: Unchanged    Stage of readiness to change (lipid management):  Maintenance  Hypertension   Last Blood Pressure: 181 / 84  (11/09/2010)   Serum creatinine: 0.63  (03/29/2010)   BMP action: Ordered   Serum potassium 3.7  (03/29/2010)   Basic metabolic panel due: 03/30/2011    Hypertension flowsheet reviewed?: Yes   Progress toward BP goal: At goal    Stage of readiness to change (hypertension management): Maintenance  Self-Management Support :   Personal Goals (by the next clinic visit) :     Personal A1C goal: 7  (02/22/2010)     Personal blood pressure goal: 130/80  (02/22/2010)     Personal LDL goal: 100  (02/22/2010)    Diabetes self-management support: Written self-care plan, Education handout, Pre-printed educational material, Resources for patients handout  (03/29/2010)   Last diabetes self-management training by diabetes educator: 12/14/2008   Last medical nutrition therapy: 12/14/2008    Hypertension self-management support: Written self-care plan, Education handout, Pre-printed educational material, Resources for patients handout  (03/29/2010)    Lipid self-management support: Written self-care plan, Education handout,  Resources for patients handout  (03/29/2010)    Nursing Instructions: Give Flu vaccine today Give tetanus booster today Give Pneumovax today GI referral for screening colonoscopy (see order) Request report of last mammogram Refer for screening diabetic eye exam (see order) Diabetic foot exam today   Process Orders Check Orders Results:     [facility]: Order checked:      -- T-Hemoccult Cards-Multiple --  [NO CODE FOUND]  Tests Sent for requisitioning (November 12, 2010 8:02 PM):     11/09/2010: [facility] -- T-Hemoccult Cards-Multiple [82270] (signed)     Laboratory Results   Blood Tests   Date/Time Received: November 09, 2010 12:15 PM Date/Time Reported: Alric Quan  November 09, 2010 12:15 PM   HGBA1C: 6.5%   (Normal Range: Non-Diabetic - 3-6%   Control Diabetic - 6-8%) CBG Random:: 120mg /dL

## 2010-11-29 NOTE — Assessment & Plan Note (Signed)
Summary: okay to book/cfb   Vital Signs:  Patient profile:   60 year old female Height:      64.25 inches (163.19 cm) Weight:      207.04 pounds (94.11 kg) Temp:     97.6 degrees F (36.44 degrees C) oral Pulse rate:   83 / minute BP sitting:   120 / 73  (left arm)  Vitals Entered By: Angelina Ok RN (March 29, 2010 2:42 PM) Is Patient Diabetic? Yes Did you bring your meter with you today? No CBG Result 114  Have you ever been in a relationship where you felt threatened, hurt or afraid?No   Does patient need assistance? Functional Status Self care Ambulation Normal Comments checkup today. Needs refills   Primary Care Provider:  Joaquin Courts  MD   History of Present Illness: Pt is a 60 yo female w/ past med hx below here for routine f/u.  She notes going on a diet recently and has been focusing on raw foods.  She has lost about 10 lbs, is feeling more energetic and regular.  Her blood sugars have been doing well.  She has no complaints.  She is running out of benicar samples and would like it switched to generic medication.   Depression History:      The patient denies a depressed mood most of the day and a diminished interest in her usual daily activities.        Comments:  On medications for.    Diabetic Foot Exam Foot Inspection Is there a history of a foot ulcer?              No Is there a foot ulcer now?              No Can the patient see the bottom of their feet?          Yes Are the shoes appropriate in style and fit?          Yes Is there swelling or an abnormal foot shape?          No Are the toenails long?                No Are the toenails thick?                No Are the toenails ingrown?              No Is there heavy callous build-up?              No Is there pain in the calf muscle (Intermittent claudication) when walking?    NoIs there a claw toe deformity?              No Is there elevated skin temperature?            No Is there limited ankle  dorsiflexion?            No Is there foot or ankle muscle weakness?            No  Diabetic Foot Care Education Patient educated on appropriate care of diabetic feet.   High Risk Feet? No  Preventive Screening-Counseling & Management  Alcohol-Tobacco     Alcohol drinks/day: 0     Smoking Status: quit  Current Medications (verified): 1)  Aurora Lancet Super Thin 30g  Misc (Lancets) .... Use 1 Needle As Directed Once A Day 2)  Benicar 20 Mg Tabs (Olmesartan Medoxomil) .... Take 1 Tablet  By Mouth Once A Day 3)  Glucophage 1000 Mg Tabs (Metformin Hcl) .... Take 1 Tablet By Mouth Two Times A Day 4)  Pravachol 40 Mg Tabs (Pravastatin Sodium) .... Take 1 Tablet By Mouth Once A Day For Cholesterol 5)  Anacin 81 Mg Tbec (Aspirin) 6)  Citalopram Hydrobromide 20 Mg Tabs (Citalopram Hydrobromide) .... Take 1 Tablet By Mouth Once A Day. 7)  Furosemide 20 Mg Tabs (Furosemide) .... Take 1 Tablet By Mouth Once A Day  Allergies (verified): 1)  ! Codeine Sulfate (Codeine Sulfate) 2)  ! Penicillin 3)  ! Lisinopril  Past History:  Past Medical History: Last updated: 06/28/2008 Depression  Diabetes mellitus, type II GERD Hyperlipidemia Hypertension    Past Surgical History: Last updated: 11/18/2008 Colon polypectomy 9/05 BTL  Social History: Last updated: 11/18/2008 She is still caretaking her elderly mother who has dementia and works part time at [employer].  Currently no health insurance. 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 She is still caretaking her elderly mother who has dementia and works part time at [employer].  Currently no health insurance. 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  Physical Exam  General:  alert, oriented, well groomed, no distress.  Eyes:  anicteric.  Ears:  pierced.  Neck:  no LAD, no JVD.  Lungs:  normal respiratory effort, no  intercostal retractions, no accessory muscle use.  CTAB Heart:  normal rate, regular rhythm, SEM I/VI at sternal border. Abdomen:  obese, soft, NT, and ND.  Extremities:  no peripheral edema. Neurologic:  gait normal.  Psych:  mood euthymic.    Impression & Recommendations:  Problem # 1:  DIABETES MELLITUS, TYPE II (ICD-250.00) Doing well.  Cont ARB, statin, foot exam today.  Her updated medication list for this problem includes:    Losartan Potassium 25 Mg Tabs (Losartan potassium) .[name]... Take 1 tablet by mouth once a day    Glucophage 1000 Mg Tabs (Metformin hcl) .[name]... Take 1 tablet by mouth two times a day    Anacin 81 Mg Tbec (Aspirin)  Orders: T- Capillary Blood Glucose (09811) T-Hgb A1C (in-house) ([id number])  Labs Reviewed: Creat: 0.86 (02/22/2010)     Last Eye Exam: No diabetic retinopathy.    (11/22/2009) Reviewed HgBA1c results: 6.3 (03/29/2010)  6.7 (12/28/2009)  Problem # 2:  ? of PRIMARY HYPERPARATHYROIDISM (ICD-252.01) F/u repeat studies since off hctz.  Orders: T-Basic Metabolic Panel [phone]) T- * Misc. Laboratory test [phone])  Problem # 3:  HYPERTENSION (ICD-401.9) BP at goal.  She had benicar samples but these are running out and she would like it switched to generic.  Will try losartan.  She has had cough w/ ACE I.  Her updated medication list for this problem includes:    Losartan Potassium 25 Mg Tabs (Losartan potassium) .[name]... Take 1 tablet by mouth once a day    Furosemide 20 Mg Tabs (Furosemide) .[name]... Take 1 tablet by mouth once a day  Problem # 4:  HYPERLIPIDEMIA (ICD-272.4) Last lipids at goal.  Her updated medication list for this problem includes:    Pravachol 40 Mg Tabs (Pravastatin sodium) .[name]... Take 1 tablet by mouth once a day for cholesterol  Labs Reviewed: SGOT: 22 (12/28/2009)   SGPT: 23 (12/28/2009)   HDL:53 (12/28/2009), 60 (06/16/2009)  LDL:86 (12/28/2009), 124 (29/52/8413)  Chol:163 (12/28/2009), 207 (06/16/2009)  Trig:121  (12/28/2009), 113 (06/16/2009)  Problem # 5:  OVERWEIGHT (ICD-278.02) Doing well with diet.  Offered praise.  Complete Medication List: 1)  Aurora Lancet Super Thin 30g Misc (Lancets) .... Use 1 needle as directed once a day 2)  Losartan Potassium 25 Mg Tabs (Losartan potassium) .... Take 1 tablet by mouth once a day 3)  Glucophage 1000 Mg Tabs (Metformin hcl) .... Take 1 tablet by mouth two times a day 4)  Pravachol 40 Mg Tabs (Pravastatin sodium) .... Take 1 tablet by mouth once a day for cholesterol 5)  Anacin 81 Mg Tbec (Aspirin) 6)  Citalopram Hydrobromide 20 Mg Tabs (Citalopram hydrobromide) .... Take 1 tablet by mouth once a day. 7)  Furosemide 20 Mg Tabs (Furosemide) .... Take 1 tablet by mouth once a day  Other Orders: Gastroenterology Referral (GI) Mammogram (Screening) (Mammo) T-Hemoccult Card-Multiple (take home) ([address])  Patient Instructions: 1)  Please make a followup appointment in 3 months for a checkup. 2)  Call sooner if you need anything.  3)  You will be called with any abnormal labwork.  Please make sure your phone number is correct at the front desk. Prescriptions: LOSARTAN POTASSIUM 25 MG TABS (LOSARTAN POTASSIUM) Take 1 tablet by mouth once a day  #30 x 3   Entered and Authorized by:   Joaquin Courts  MD   Signed by:   Joaquin Courts  MD on 03/29/2010   Method used:   Electronically to        [facility]  [phone]* (retail)       [address]       [place], [place]  [address]       Ph: [phone] or [phone]       Fax: [phone]   RxID:   [id number]    Vital Signs:  Patient profile:   60 year old female Height:      64.25 inches (163.19 cm) Weight:      207.04 pounds (94.11 kg) Temp:     97.6 degrees F (36.44 degrees C) oral Pulse rate:   83 / minute BP sitting:   120 / 73  (left arm)  Vitals Entered By: Angelina Ok RN (March 29, 2010 2:42 PM)   Prevention & Chronic Care Immunizations    Influenza vaccine: Fluvax Non-MCR  (10/17/2009)   Influenza vaccine due: 06/28/2009    Tetanus booster: Not documented   Td booster deferral: Deferred  (03/29/2010)    Pneumococcal vaccine: Not documented  Colorectal Screening   Hemoccult: Not documented   Hemoccult action/deferral: Ordered  (03/29/2010)    Colonoscopy: Not documented   Colonoscopy action/deferral: GI referral  (03/29/2010)  Other Screening   Pap smear: NEGATIVE FOR INTRAEPITHELIAL LESIONS OR MALIGNANCY.  (12/28/2009)   Pap smear action/deferral: Ordered  (12/28/2009)    Mammogram: ASSESSMENT: Negative - BI-RADS 1^MS DIGITAL SCREENING  (12/16/2008)   Mammogram action/deferral: Ordered  (03/29/2010)   Smoking status: quit  (03/29/2010)  Diabetes Mellitus   HgbA1C: 6.3  (03/29/2010)    Eye exam: No diabetic retinopathy.     (11/22/2009)   Eye exam due: 11/2010    Foot exam: yes  (11/18/2008)   Foot exam action/deferral: Do today   High risk foot: No  (03/29/2010)   Foot care education: Done  (03/29/2010)    Urine microalbumin/creatinine ratio: 13.0  (11/18/2008)   Urine microalbumin action/deferral: Not indicated    Diabetes flowsheet reviewed?: Yes   Progress  toward A1C goal: Unchanged  Lipids   Total Cholesterol: 163  (12/28/2009)   Lipid panel action/deferral: Lipid Panel ordered   LDL: 86  (12/28/2009)   LDL Direct: Not documented   HDL: 53  (12/28/2009)   Triglycerides: 121  (12/28/2009)    SGOT (AST): 22  (12/28/2009)   SGPT (ALT): 23  (12/28/2009)   Alkaline phosphatase: 147  (12/28/2009)   Total bilirubin: 0.3  (12/28/2009)    Lipid flowsheet reviewed?: Yes   Progress toward LDL goal: At goal  Hypertension   Last Blood Pressure: 120 / 73  (03/29/2010)   Serum creatinine: 0.86  (02/22/2010)   BMP action: Ordered   Serum potassium 3.5  (02/22/2010)    Hypertension flowsheet reviewed?: Yes   Progress toward BP goal: At goal  Self-Management Support :   Personal Goals (by the  next clinic visit) :     Personal A1C goal: 7  (02/22/2010)     Personal blood pressure goal: 130/80  (02/22/2010)     Personal LDL goal: 100  (02/22/2010)    Patient will work on the following items until the next clinic visit to reach self-care goals:     Medications and monitoring: take my medicines every day, check my blood sugar, check my blood pressure, bring all of my medications to every visit, weigh myself weekly, examine my feet every day  (03/29/2010)     Eating: drink diet soda or water instead of juice or soda, eat more vegetables, use fresh or frozen vegetables, eat foods that are low in salt, eat baked foods instead of fried foods, eat fruit for snacks and desserts, limit or avoid alcohol  (03/29/2010)     Activity: take a 30 minute walk every day, take the stairs instead of the elevator, park at the far end of the parking lot  (03/29/2010)    Diabetes self-management support: Written self-care plan, Education handout, Pre-printed educational material, Resources for patients handout  (03/29/2010)   Diabetes care plan printed   Diabetes education handout printed   Last diabetes self-management training by diabetes educator: 12/14/2008   Last medical nutrition therapy: 12/14/2008    Hypertension self-management support: Written self-care plan, Education handout, Pre-printed educational material, Resources for patients handout  (03/29/2010)   Hypertension self-care plan printed.   Hypertension education handout printed    Lipid self-management support: Written self-care plan, Education handout, Resources for patients handout  (03/29/2010)   Lipid self-care plan printed.   Lipid education handout printed      Resource handout printed.   Nursing Instructions: Schedule screening mammogram (see order) GI referral for screening colonoscopy (see order) Diabetic foot exam today Provide Hemoccult cards with instructions (see order)   Laboratory Results   Blood Tests     Date/Time Received: March 29, 2010 3:03 PM Date/Time Reported: Burke Keels  March 29, 2010 3:03 PM   HGBA1C: 6.3%   (Normal Range: Non-Diabetic - 3-6%   Control Diabetic - 6-8%) CBG Random:: 114mg /dL      Process Orders Check Orders Results:     [facility]: ABN not required for this insurance Tests Sent for requisitioning (March 29, 2010 7:42 PM):     03/29/2010: [facility] -- T-Basic Metabolic Panel [phone] (signed)     03/29/2010: [facility] -- T- * Misc. Laboratory test [id number] (signed)

## 2010-11-29 NOTE — Letter (Signed)
Summary: Referral : Appt. Notice  Referral : Appt. Notice   Imported By: Florinda Marker 11/06/2009 14:56:07  _____________________________________________________________________  External Attachment:    Type:   Image     Comment:   External Document

## 2010-11-29 NOTE — Progress Notes (Signed)
Summary: refill/ hla  Phone Note Refill Request Message from:  Patient on Mar 21, 2010 5:28 PM  Refills Requested: Medication #1:  HCTZ 12.5mg  1 tablet daily Initial call taken by: Marin Roberts RN,  Mar 21, 2010 5:28 PM  Follow-up for Phone Call        I will call her in lasix instead b/c it doesn't have the affect of potentially increasing serum calcium.  Let her know she needs to come in for a BMET in 1-2 weeks.  Follow-up by: Joaquin Courts  MD,  Mar 22, 2010 8:07 AM  Additional Follow-up for Phone Call Additional follow up Details #1::        see prior note Additional Follow-up by: Marin Roberts RN,  Mar 22, 2010 3:15 PM    New/Updated Medications: FUROSEMIDE 20 MG TABS (FUROSEMIDE) Take 1 tablet by mouth once a day Prescriptions: FUROSEMIDE 20 MG TABS (FUROSEMIDE) Take 1 tablet by mouth once a day  #30 x 0   Entered and Authorized by:   Joaquin Courts  MD   Signed by:   Joaquin Courts  MD on 03/22/2010   Method used:   Electronically to        [facility]  [phone]* (retail)       [address]       [place], [place]  [address]       Ph: [phone] or [phone]       Fax: [phone]   RxID:   [id number]

## 2010-11-29 NOTE — Miscellaneous (Signed)
Summary: Flu Vaccine/[facility]  Flu Vaccine/[facility]   Imported By: Maryln Gottron 02/12/2010 15:52:47  _____________________________________________________________________  External Attachment:    Type:   Image     Comment:   External Document

## 2010-11-29 NOTE — Progress Notes (Signed)
Summary: refill, BP, swelling/ hla  Phone Note Call from Patient   Summary of Call: pt calls and says she is having swelling of hands and feet, h/a and BP 150/88, states you took her off hctz but feels she should start back due to these problems, would like you to send script to her pharm. Initial call taken by: Marin Roberts RN,  Mar 21, 2010 5:26 PM  Follow-up for Phone Call        I will call her in lasix instead b/c it doesn't have the affect of potentially increasing serum calcium.  Let her know she needs to come in for a BMET in 1-2 weeks.  Follow-up by: Joaquin Courts  MD,  Mar 22, 2010 8:07 AM  Additional Follow-up for Phone Call Additional follow up Details #1::        spoke w/ pt scheduled lab appt for wed 6/1, instructed pt on lasix and encouraged her to keep appts. pt verb understanding Additional Follow-up by: Marin Roberts RN,  Mar 22, 2010 3:15 PM

## 2010-11-29 NOTE — Progress Notes (Signed)
Summary: phone/gg  Phone Note Call from Patient   Summary of Call: Pt called for lab results  I talked with Dr Andrey Campanile and she will call pt with results   pt informed Initial call taken by: Merrie Roof RN,  Feb 26, 2010 5:03 PM

## 2010-11-29 NOTE — Progress Notes (Signed)
Summary: Refill/gh  Phone Note Refill Request Message from:  Patient on September 14, 2010 4:53 PM  Refills Requested: Medication #1:  GLUCOPHAGE 1000 MG TABS Take 1 tablet by mouth two times a day  Medication #2:  FUROSEMIDE 20 MG TABS Take 1 tablet by mouth once a day.  Medication #3:  LOSARTAN POTASSIUM 25 MG TABS Take 1 tablet by mouth once a day  Medication #4:  CITALOPRAM HYDROBROMIDE 20 MG TABS Take 1 tablet by mouth once a day. Last visit and labs were 03/29/2010.  Pt is leaving town and waiting at [facility]   Method Requested: Electronic Initial call taken by: Angelina Ok RN,  September 14, 2010 4:53 PM  Follow-up for Phone Call        1 refill only until app't Follow-up by: Ulyess Mort MD,  September 14, 2010 5:28 PM    Prescriptions: FUROSEMIDE 20 MG TABS (FUROSEMIDE) Take 1 tablet by mouth once a day  #30 Each x 0   Entered and Authorized by:   Ulyess Mort MD   Signed by:   Ulyess Mort MD on 09/14/2010   Method used:   Electronically to        [facility]  [phone]* (retail)       [address]       [place], [place]  [address]       Ph: [phone] or [phone]       Fax: [phone]   RxID:   [id number] CITALOPRAM HYDROBROMIDE 20 MG TABS (CITALOPRAM HYDROBROMIDE) Take 1 tablet by mouth once a day.  #30 x 0   Entered and Authorized by:   Ulyess Mort MD   Signed by:   Ulyess Mort MD on 09/14/2010   Method used:   Electronically to        [facility]  [phone]* (retail)       [address]       [place], [place]  [address]       Ph: [phone] or [phone]       Fax: [phone]   RxID:   [id number] POTASSIUM 25 MG TABS (LOSARTAN POTASSIUM) Take 1 tablet by mouth once a day  #30 x 0   Entered and Authorized by:   Ulyess Mort MD   Signed by:   Ulyess Mort MD on 09/14/2010   Method used:   Electronically to        [facility]  [phone]*  (retail)       [address]       [place], [place]  [address]       Ph: [phone] or [phone]       Fax: [phone]   RxID:   [id number] GLUCOPHAGE 1000 MG TABS (METFORMIN HCL) Take 1 tablet by mouth two times a day  #60 Each x 0   Entered and Authorized by:   Ulyess Mort MD   Signed by:   Ulyess Mort MD on 09/14/2010   Method used:   Electronically to        [facility]  [phone]* (retail)       [address]       [place], [place]  [address]       Ph: [phone] or [phone]  Fax: [phone]   RxID:   [id number]

## 2010-11-29 NOTE — Assessment & Plan Note (Signed)
Summary: check up [Sherri Gill]   Vital Signs:  Patient profile:   60 year old female Height:      64.25 inches (163.19 cm) Weight:      218.8 pounds (99.45 kg) BMI:     37.40 Temp:     97.1 degrees F (36.17 degrees C) oral Pulse rate:   79 / minute BP sitting:   124 / 70  (right arm)  Vitals Entered By: Stanton Kidney Ditzler RN (December 28, 2009 11:31 AM) Is Patient Diabetic? Yes Did you bring your meter with you today? No Pain Assessment Patient in pain? no      Nutritional Status BMI of > 30 = obese Nutritional Status Detail appetite good CBG Result 86  Have you ever been in a relationship where you felt threatened, hurt or afraid?denies   Does patient need assistance? Functional Status Self care Ambulation Normal Comments Ck-up and diarrhea x 2 weeks.   Primary Care Provider:  Joaquin Courts  MD   History of Present Illness: Pt is a pleasant 60 yo female w/ past med hx below here for routine f/u.  She notes having a stomach virus last week.  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  She is doing well and has no complaints except for occasional diarrhea in the mornings after taking metformin. She would like to get her pap smear done today.  She has occaisonally tingling pains in her hands but this occurs very rarely.  Occasional has sympotms in her feet.   Depression History:      The patient denies a depressed mood most of the day and a diminished interest in her usual daily activities.         Preventive Screening-Counseling & Management  Alcohol-Tobacco     Alcohol drinks/day: 0     Smoking Status: quit  Caffeine-Diet-Exercise     Does Patient Exercise: no  Current Medications (verified): 1)  Aurora Lancet Super Thin 30g  Misc (Lancets) .... Use 1 Needle As Directed Once A Day 2)  Benicar 20 Mg Tabs (Olmesartan Medoxomil) .... Take 1 Tablet By Mouth Once A Day 3)  Glucophage 1000 Mg Tabs (Metformin Hcl) .... Take 1 Tablet By  Mouth Two Times A Day 4)  Pravachol 40 Mg Tabs (Pravastatin Sodium) .... Take 1 Tablet By Mouth Once A Day For Cholesterol 5)  Nexium 40 Mg Cpdr (Esomeprazole Magnesium) .[name].. 1 By Mouth Once Daily 6)  Anacin 81 Mg Tbec (Aspirin) 7)  Citalopram Hydrobromide 20 Mg Tabs (Citalopram Hydrobromide) .... Take 1 Tablet By Mouth Once A Day. 8)  Hydrochlorothiazide 12.5 Mg Caps (Hydrochlorothiazide) .... Take 1 Tablet By Mouth Once A Day  Allergies: 1)  ! Codeine Sulfate (Codeine Sulfate) 2)  ! Penicillin  Past History:  Past Medical History: Last updated: 06/28/2008 Depression  Diabetes mellitus, type II GERD Hyperlipidemia Hypertension    Past Surgical History: Last updated: 11/18/2008 Colon polypectomy 9/05 BTL  Social History: Last updated: 11/18/2008 She is still caretaking her elderly mother who has dementia and works part time at [employer].  Currently no health insurance. 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 She is still caretaking her elderly mother who has dementia and works part time at [employer].  Currently no health insurance. 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  Physical Exam  General:  alert, appropriate dress, and normal appearance.   Eyes:  anicteric Mouth:  MMM, good dentition Neck:  no LAD Breasts:  No mass, nodules, thickening, nipple discharge or skin changes noted.  Minimal tenderness over pectoral muscles noted bilaterally Lungs:  Normal respiratory effort, chest expands symmetrically. Lungs are clear to auscultation, no crackles or wheezes. Heart:  Normal rate and regular rhythm. S1 and S2 normal without gallop, murmur, click, rub or other extra sounds. Abdomen:  +BS's, soft, NT and ND.  Rectal:  small external hemorrhoid noted Genitalia:  Normal introitus for age, no external lesions, no vaginal discharge, mucosa pink and moist, no  vaginal or cervical lesions, no vaginal atrophy, no friaility or hemorrhage, normal uterus size and position, no adnexal masses or tenderness Extremities:  no edema Neurologic:  sensation intact in her hands, Phalen/Tinnel neg, strength in hands/wrists intact. Cervical Nodes:  No lymphadenopathy noted Axillary Nodes:  No palpable lymphadenopathy Psych:  mood euthymic.   Impression & Recommendations:  Problem # 1:  HYPERTENSION (ICD-401.9) BP good.  Lytes/cr today.  Her updated medication list for this problem includes:    Benicar 20 Mg Tabs (Olmesartan medoxomil) .[name]... Take 1 tablet by mouth once a day    Hydrochlorothiazide 12.5 Mg Caps (Hydrochlorothiazide) .[name]... Take 1 tablet by mouth once a day  BP today: 124/70 Prior BP: 111/65 (06/16/2009)  Labs Reviewed: K+: 4.0 (06/16/2009) Creat: : 0.75 (06/16/2009)   Chol: 207 (06/16/2009)   HDL: 60 (06/16/2009)   LDL: 124 (06/16/2009)   TG: 113 (06/16/2009)  Problem # 2:  HYPERLIPIDEMIA (ICD-272.4) Lipids/LFT's today.  Goal LDL < 70.  Her updated medication list for this problem includes:    Pravachol 40 Mg Tabs (Pravastatin sodium) .[name]... Take 1 tablet by mouth once a day for cholesterol  Orders: T-Lipid Profile ([id number]) T-Comprehensive Metabolic Panel ([phone])  Labs Reviewed: SGOT: 19 (11/18/2008)   SGPT: 20 (11/18/2008)   HDL:60 (06/16/2009), 63 (11/18/2008)  LDL:124 (06/16/2009), 91 (59/56/3875)  Chol:207 (06/16/2009), 176 (11/18/2008)  Trig:113 (06/16/2009), 111 (11/18/2008)  Problem # 3:  DIABETES MELLITUS, TYPE II (ICD-250.00) A1c at goal, cont current meds.  Cont ARB, lipids today w/ goal LDL < 70.  Has had eye exam done.   Her updated medication list for this problem includes:    Benicar 20 Mg Tabs (Olmesartan medoxomil) .[name]... Take 1 tablet by mouth once a day    Glucophage 1000 Mg Tabs (Metformin hcl) .[name]... Take 1 tablet by mouth two times a day    Anacin 81 Mg Tbec (Aspirin)  Orders: T- Capillary Blood  Glucose ([id number]) T-Hgb A1C (in-house) ([id number])  Labs Reviewed: Creat: 0.75 (06/16/2009)    Reviewed HgBA1c results: 6.7 (12/28/2009)  6.5 (06/16/2009)  Problem # 4:  ROUTINE GYNECOLOGICAL EXAMINATION (ICD-V72.31) PE looks nl.  F/u pap smear.  No need for STD screening as she had it done last time it was nl and she is not sexually active.  Problem # 5:  PERIPHERAL NEUROPATHY (ICD-356.9) Reports occasional tingling in hands.  May have early DR.  Will check below studies to look for other causes.  Also may have early carpel tunnel but Tinel/Phalen's test neg.  It is not bothering her enough to warrant meds, but I have asked her to return if it gets worse.  Orders: T-Vitamin B12 ([id number]) T-TSH [phone])  Problem # 6:  DEPRESSION (ICD-311) Will refill celexa.  Doing well, esp now that hospice is involved helping her take care of  her mother.  Her updated medication list for this problem includes:    Citalopram Hydrobromide 20 Mg Tabs (Citalopram hydrobromide) .[name]... Take 1 tablet by mouth once a day.  Complete Medication List: 1)  Aurora Lancet Super Thin 30g Misc (Lancets) .... Use 1 needle as directed once a day 2)  Benicar 20 Mg Tabs (Olmesartan medoxomil) .... Take 1 tablet by mouth once a day 3)  Glucophage 1000 Mg Tabs (Metformin hcl) .... Take 1 tablet by mouth two times a day 4)  Pravachol 40 Mg Tabs (Pravastatin sodium) .... Take 1 tablet by mouth once a day for cholesterol 5)  Nexium 40 Mg Cpdr (Esomeprazole magnesium) .[name].. 1 by mouth once daily 6)  Anacin 81 Mg Tbec (Aspirin) 7)  Citalopram Hydrobromide 20 Mg Tabs (Citalopram hydrobromide) .... Take 1 tablet by mouth once a day. 8)  Hydrochlorothiazide 12.5 Mg Caps (Hydrochlorothiazide) .... Take 1 tablet by mouth once a day  Other Orders: Mammogram (Screening) (Mammo) T-PAP [facility]) [phone]) Gastroenterology Referral (GI)  Patient Instructions: 1)  Please make a followup appointment in 3 months for a  checkup. 2)  Call sooner if you need anything. 3)  You will be called with any abnormal labwork.  Please make sure your phone number is correct at the front desk. 4)  Please get your mammogram done and go see the gastroenterologist to get your colonoscopy done. Prescriptions: HYDROCHLOROTHIAZIDE 12.5 MG CAPS (HYDROCHLOROTHIAZIDE) Take 1 tablet by mouth once a day  #30 x 11   Entered and Authorized by:   Joaquin Courts  MD   Signed by:   Joaquin Courts  MD on 12/28/2009   Method used:   Electronically to        [facility]  [phone]* (retail)       [address]       [place], [place]  [address]       Ph: [phone] or [phone]       Fax: [phone]   RxID:   [id number] CITALOPRAM HYDROBROMIDE 20 MG TABS (CITALOPRAM HYDROBROMIDE) Take 1 tablet by mouth once a day.  #30 x 11   Entered and Authorized by:   Joaquin Courts  MD   Signed by:   Joaquin Courts  MD on 12/28/2009   Method used:   Electronically to        [facility]  [phone]* (retail)       [address]       [place], [place]  [address]       Ph: [phone] or [phone]       Fax: [phone]   RxID:   [id number]  Process Orders Check Orders Results:     [facility]: ABN not required for this insurance Tests Sent for requisitioning (December 29, 2009 8:32 AM):     12/28/2009: [facility] -- T-Lipid Profile [phone] (signed)     12/28/2009: [facility] -- T-Comprehensive Metabolic Panel [[id number]] (signed)     12/28/2009: [facility] -- T-Vitamin B12 [phone] (signed)     12/28/2009: [facility] -- T-TSH [phone] (signed)    Prevention & Chronic Care Immunizations   Influenza vaccine: Fluvax Non-MCR  (10/17/2009)   Influenza vaccine due: 06/28/2009    Tetanus booster: Not documented    Pneumococcal vaccine: Not  documented  Colorectal Screening   Hemoccult: Not documented    Colonoscopy:  Not documented   Colonoscopy action/deferral: GI referral  (12/28/2009)  Other Screening   Pap smear: NEGATIVE FOR INTRAEPITHELIAL LESIONS OR MALIGNANCY.  (11/18/2008)   Pap smear action/deferral: Ordered  (12/28/2009)    Mammogram: ASSESSMENT: Negative - BI-RADS 1^MS DIGITAL SCREENING  (12/16/2008)   Mammogram action/deferral: Ordered  (12/28/2009)   Smoking status: quit  (12/28/2009)  Diabetes Mellitus   HgbA1C: 6.7  (12/28/2009)    Eye exam: Not documented    Foot exam: yes  (11/18/2008)   High risk foot: Not documented   Foot care education: Not documented    Urine microalbumin/creatinine ratio: 13.0  (11/18/2008)   Urine microalbumin action/deferral: Not indicated    Diabetes flowsheet reviewed?: Yes   Progress toward A1C goal: At goal  Lipids   Total Cholesterol: 207  (06/16/2009)   Lipid panel action/deferral: Lipid Panel ordered   LDL: 124  (06/16/2009)   LDL Direct: Not documented   HDL: 60  (06/16/2009)   Triglycerides: 113  (06/16/2009)    SGOT (AST): 19  (11/18/2008)   SGPT (ALT): 20  (11/18/2008) CMP ordered    Alkaline phosphatase: 131  (11/18/2008)   Total bilirubin: 0.3  (11/18/2008)    Lipid flowsheet reviewed?: Yes   Progress toward LDL goal: Unchanged  Hypertension   Last Blood Pressure: 124 / 70  (12/28/2009)   Serum creatinine: 0.75  (06/16/2009)   BMP action: Ordered   Serum potassium 4.0  (06/16/2009) CMP ordered     Hypertension flowsheet reviewed?: Yes   Progress toward BP goal: At goal  Self-Management Support :    Patient will work on the following items until the next clinic visit to reach self-care goals:     Medications and monitoring: take my medicines every day, check my blood sugar, bring all of my medications to every visit, examine my feet every day  (12/28/2009)     Eating: drink diet soda or water instead of juice or soda, eat more  vegetables, use fresh or frozen vegetables, eat foods that are low in salt, eat fruit for snacks and desserts, limit or avoid alcohol  (12/28/2009)     Activity: take a 30 minute walk every day, park at the far end of the parking lot  (12/28/2009)    Diabetes self-management support: Education handout, Written self-care plan, Resources for patients handout  (12/28/2009)   Diabetes care plan printed   Diabetes education handout printed   Last diabetes self-management training by diabetes educator: 12/14/2008   Last medical nutrition therapy: 12/14/2008    Hypertension self-management support: Written self-care plan, Education handout, Resources for patients handout  (12/28/2009)   Hypertension self-care plan printed.   Hypertension education handout printed    Lipid self-management support: Written self-care plan, Education handout, Resources for patients handout  (12/28/2009)   Lipid self-care plan printed.   Lipid education handout printed      Resource handout printed.   Nursing Instructions: Schedule screening mammogram (see order) Pap smear today GI referral for screening colonoscopy (see order)   Laboratory Results   Blood Tests   Date/Time Received: December 28, 2009 11:54 AM Date/Time Reported: Alric Quan  December 28, 2009 11:54 AM   HGBA1C: 6.7%   (Normal Range: Non-Diabetic - 3-6%   Control Diabetic - 6-8%) CBG Random:: 86mg /dL

## 2010-11-29 NOTE — Assessment & Plan Note (Signed)
Summary: diarrhea, tired since 4/24/pcp-wilson/hla   Vital Signs:  Patient profile:   60 year old female Height:      64.25 inches (163.19 cm) Weight:      206.6 pounds (93.91 kg) BMI:     35.31 Temp:     98.1 degrees F (36.72 degrees C) oral Pulse rate:   82 / minute BP sitting:   120 / 69  (left arm)  Vitals Entered By: Stanton Kidney Ditzler RN (February 22, 2010 1:58 PM) Is Patient Diabetic? Yes Did you bring your meter with you today? No Pain Assessment Patient in pain? no      Nutritional Status BMI of > 30 = obese Nutritional Status Detail appetite down CBG Result 111  Have you ever been in a relationship where you felt threatened, hurt or afraid?denies   Does patient need assistance? Functional Status Self care Ambulation Normal Comments On 02/17/10 left side of face felt funny. Since then no energy, vomiting and diarrhea. Low back hurts with coughing.   Primary Care Provider:  Joaquin Courts  MD   History of Present Illness: Pt is a pleasant 60 yo female w/ past med hx outlined in EMR present for acute visit of diarrhea.  She reports exposure to a young boy with vomiting and diarrhea approx 1 week prior to her appt.  On Sunday, she developed profuse non-bloody, non-bilious emesis followed by considerable amounts of watery diarrhea, approx 10 episdodes/day. The vomiting has resolved but the diarrhea has continued.  She is having less diarrhea now, approx 4-5 episodes/day.   She denies abdominal pain and inability to tolerate food/fluid, but does admit to occasional nasuea and decreased appetite.  She admits to fever/chills at home on Sunday but states this has resolved.  She denies any travel or exposure to new foods, undercooked and unpasteurized foods.  She is feeling better but wanted to see a doctor 2/2 concern that diarrhea has continued for 5 days.  No recent abx use.  She is also very concerned about her elevated alk phos.  She returned for her GGT test, but has not come in  the additional lab work ordered by Dr. Andrey Campanile; she would like to have these labs drawn today.  She reports her BP has been good at home and she is taking her medicine regularly.  She reports a prior discussion with Dr. Andrey Campanile about changing her to a less expensive medicine.  She cannot recall the name or type of the medication.  She cannot take ACE inhibitors 2/2 chronic cough.  She has no other concerns/complaints today.       Depression History:      The patient denies a depressed mood most of the day and a diminished interest in her usual daily activities.         Preventive Screening-Counseling & Management  Alcohol-Tobacco     Alcohol drinks/day: 0     Smoking Status: quit  Caffeine-Diet-Exercise     Does Patient Exercise: no  Current Problems (verified): 1)  Diarrhea  (ICD-787.91) 2)  Alkaline Phosphatase, Elevated  (ICD-790.5) 3)  Routine Gynecological Examination  (ICD-V72.31) 4)  Peripheral Neuropathy  (ICD-356.9) 5)  Breast Pain  (ICD-611.71) 6)  Preventive Health Care  (ICD-V70.0) 7)  Dysuria  (ICD-788.1) 8)  Anxiety State, Unspecified  (ICD-300.00) 9)  Hypertension  (ICD-401.9) 10)  Hyperlipidemia  (ICD-272.4) 11)  Gerd  (ICD-530.81) 12)  Diabetes Mellitus, Type II  (ICD-250.00) 13)  Depression  (ICD-311)  Current Medications (verified): 1)  Aurora  Lancet Super Thin 30g  Misc (Lancets) .... Use 1 Needle As Directed Once A Day 2)  Benicar 20 Mg Tabs (Olmesartan Medoxomil) .... Take 1 Tablet By Mouth Once A Day 3)  Glucophage 1000 Mg Tabs (Metformin Hcl) .... Take 1 Tablet By Mouth Two Times A Day 4)  Pravachol 40 Mg Tabs (Pravastatin Sodium) .... Take 1 Tablet By Mouth Once A Day For Cholesterol 5)  Nexium 40 Mg Cpdr (Esomeprazole Magnesium) .[name].. 1 By Mouth Once Daily 6)  Anacin 81 Mg Tbec (Aspirin) 7)  Citalopram Hydrobromide 20 Mg Tabs (Citalopram Hydrobromide) .... Take 1 Tablet By Mouth Once A Day. 8)  Hydrochlorothiazide 12.5 Mg Caps  (Hydrochlorothiazide) .... Take 1 Tablet By Mouth Once A Day  Allergies: 1)  ! Codeine Sulfate (Codeine Sulfate) 2)  ! Penicillin  Past History:  Past medical, surgical, family and social histories (including risk factors) reviewed for relevance to current acute and chronic problems.  Past Medical History: Reviewed history from 06/28/2008 and no changes required. Depression  Diabetes mellitus, type II GERD Hyperlipidemia Hypertension    Past Surgical History: Reviewed history from 11/18/2008 and no changes required. Colon polypectomy 9/05 BTL  Family History: Reviewed history from 10/15/2007 and no changes required. dementia in mom  Social History: Reviewed history from 11/18/2008 and no changes required. She is still caretaking her elderly mother who has dementia and works part time at [employer].  Currently no health insurance. 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  Physical Exam  General:  alert, appropriate dress, and normal appearance.   Head:  normocephalic and atraumatic.   Eyes:  vision grossly intact.  Sclerae anicteric.  No conjunctival pallor or injection. Mouth:  pharynx pink and moist, no erythema, no exudates, and no lesions.   Neck:  supple and no masses.  no LAD. Lungs:  normal respiratory effort, no intercostal retractions, no accessory muscle use.  CTAB Heart:  normal rate, regular rhythm, no murmur, no gallop, and no rub.   Abdomen:  soft, non-tender, no distention, no masses, no guarding, no rigidity, no rebound tenderness, no hepatomegaly, and no splenomegaly.  Muphy's negative.  Bowel sounds mildly hyperactive. Extremities:  No edema Neurologic:  alert & oriented X3.  No focal deficit. Skin:  turgor normal, color normal, and no rashes.   Psych:  Oriented X3, memory intact for recent and remote, normally interactive, and good eye contact.      Impression & Recommendations:  Problem # 1:  DIARRHEA (ICD-787.91) Discussed symptom control and diet. Pt is instructed to dilute gatorade/gingerale with water to prevent worsening of her symptoms with osmotic diarrhea.  She is advised to call the clinic if her symptoms worsen or she develops signs of dehydration.   Will check a BMET today to evalutate her electrolyte status.  Orders: T-Basic Metabolic Panel [phone])  Problem # 2:  ALKALINE PHOSPHATASE, ELEVATED (ICD-790.5) Pt is very anxious about her elevated alk phos.  Reassured her that an elevated alk phos does not mean she has fulminant liver failure and that there are many possibly etiologies including GI and bone.  Informed her that while we do not know why her level is elevated at this moment, further lab testing will help [place] refine the dx.  Will send her to the lab to have labs drawn ordered by Dr. Andrey Campanile at pts last [facility] visit.  Please refer to Dr.  Wilson's previous note for additional relevant information.  Problem # 3:  HYPERTENSION (ICD-401.9) Her BP is stable on Benicar and HCTZ.  Will continue with this regimen for now.    Her updated medication list for this problem includes:    Benicar 20 Mg Tabs (Olmesartan medoxomil) .[name]... Take 1 tablet by mouth once a day    Hydrochlorothiazide 12.5 Mg Caps (Hydrochlorothiazide) .[name]... Take 1 tablet by mouth once a day  Complete Medication List: 1)  Aurora Lancet Super Thin 30g Misc (Lancets) .... Use 1 needle as directed once a day 2)  Benicar 20 Mg Tabs (Olmesartan medoxomil) .... Take 1 tablet by mouth once a day 3)  Glucophage 1000 Mg Tabs (Metformin hcl) .... Take 1 tablet by mouth two times a day 4)  Pravachol 40 Mg Tabs (Pravastatin sodium) .... Take 1 tablet by mouth once a day for cholesterol 5)  Nexium 40 Mg Cpdr (Esomeprazole magnesium) .[name].. 1 by mouth once daily 6)  Anacin 81 Mg Tbec (Aspirin) 7)  Citalopram Hydrobromide 20 Mg Tabs (Citalopram hydrobromide) .... Take  1 tablet by mouth once a day. 8)  Hydrochlorothiazide 12.5 Mg Caps (Hydrochlorothiazide) .... Take 1 tablet by mouth once a day  Other Orders: Capillary Blood Glucose/CBG (16109)  Patient Instructions: 1)  Please schedule a follow-up appointment in 2 months or sooner if needed. 2)  We will call you with the results of your lab work. 3)  Take your nexium every day. 4)  It is ok to take Immodium as directed to help with your diarrhea. 5)  Drink plenty of fluids and take solids as you feel better. If you are unable to keep anything down and/or you show signs of dehydration(dry/cracked lips, lack of tears, not urinating, very sleepy), call our office. 6)  Dilute gatrorade and gingerale with water: try 2/3 water to 1/3 gatorade.  Both of those drinks have lots of sugar that can make your diarrhea worse. Process Orders Check Orders Results:     [facility]: ABN not required for this insurance Tests Sent for requisitioning (Feb 25, 2010 1:52 PM):     02/22/2010: [facility] -- T-Basic Metabolic Panel [phone] (signed)    Prevention & Chronic Care Immunizations   Influenza vaccine: Fluvax Non-MCR  (10/17/2009)   Influenza vaccine due: 06/28/2009    Tetanus booster: Not documented    Pneumococcal vaccine: Not documented  Colorectal Screening   Hemoccult: Not documented    Colonoscopy: Not documented   Colonoscopy action/deferral: GI referral  (12/28/2009)  Other Screening   Pap smear: NEGATIVE FOR INTRAEPITHELIAL LESIONS OR MALIGNANCY.  (12/28/2009)   Pap smear action/deferral: Ordered  (12/28/2009)    Mammogram: ASSESSMENT: Negative - BI-RADS 1^MS DIGITAL SCREENING  (12/16/2008)   Mammogram action/deferral: Ordered  (12/28/2009)   Smoking status: quit  (02/22/2010)  Diabetes Mellitus   HgbA1C: 6.7  (12/28/2009)    Eye exam: No diabetic retinopathy.     (11/22/2009)   Eye exam due: 11/2010    Foot exam: yes  (11/18/2008)   High risk  foot: Not documented   Foot care education: Not documented    Urine microalbumin/creatinine ratio: 13.0  (11/18/2008)   Urine microalbumin action/deferral: Not indicated  Lipids   Total Cholesterol: 163  (12/28/2009)   Lipid panel action/deferral: Lipid Panel ordered   LDL: 86  (12/28/2009)   LDL Direct: Not documented   HDL: 53  (12/28/2009)   Triglycerides: 121  (12/28/2009)    SGOT (AST): 22  (12/28/2009)  SGPT (ALT): 23  (12/28/2009)   Alkaline phosphatase: 147  (12/28/2009)   Total bilirubin: 0.3  (12/28/2009)  Hypertension   Last Blood Pressure: 120 / 69  (02/22/2010)   Serum creatinine: 0.68  (12/28/2009)   BMP action: Ordered   Serum potassium 4.4  (12/28/2009)  Self-Management Support :   Personal Goals (by the next clinic visit) :     Personal A1C goal: 7  (02/22/2010)     Personal blood pressure goal: 130/80  (02/22/2010)     Personal LDL goal: 100  (02/22/2010)    Patient will work on the following items until the next clinic visit to reach self-care goals:     Medications and monitoring: take my medicines every day, check my blood sugar, check my blood pressure, bring all of my medications to every visit  (02/22/2010)     Eating: drink diet soda or water instead of juice or soda, eat more vegetables, use fresh or frozen vegetables, eat foods that are low in salt, eat baked foods instead of fried foods, eat fruit for snacks and desserts, limit or avoid alcohol  (02/22/2010)     Activity: take a 30 minute walk every day, take the stairs instead of the elevator, park at the far end of the parking lot  (02/22/2010)    Diabetes self-management support: Written self-care plan, Education handout, Resources for patients handout  (02/22/2010)   Diabetes care plan printed   Diabetes education handout printed   Last diabetes self-management training by diabetes educator: 12/14/2008   Last medical nutrition therapy: 12/14/2008    Hypertension self-management support:  Written self-care plan, Education handout, Resources for patients handout  (02/22/2010)   Hypertension self-care plan printed.   Hypertension education handout printed    Lipid self-management support: Written self-care plan, Education handout, Resources for patients handout  (02/22/2010)   Lipid self-care plan printed.   Lipid education handout printed      Resource handout printed.

## 2010-11-29 NOTE — Progress Notes (Signed)
Summary: refill/gg  Phone Note Refill Request  on December 22, 2009 4:25 PM  Refills Requested: Medication #1:  PRAVACHOL 40 MG TABS Take 1 tablet by mouth once a day for cholesterol   Last Refilled: 11/03/2009 last labs 06/16/09   Method Requested: Electronic Initial call taken by: Merrie Roof RN,  December 22, 2009 4:25 PM  Follow-up for Phone Call        Please let her know she needs an appt for labs.  She has not had LFT's done in one year here.  I will refill x 1 month but she needs to come in for an appt before it is refilled again.  Thanks. Follow-up by: Joaquin Courts  MD,  December 24, 2009 12:36 PM    Prescriptions: PRAVACHOL 40 MG TABS (PRAVASTATIN SODIUM) Take 1 tablet by mouth once a day for cholesterol  #30 x 0   Entered and Authorized by:   Joaquin Courts  MD   Signed by:   Joaquin Courts  MD on 12/24/2009   Method used:   Electronically to        [facility]  [phone]* (retail)       [address]       [place], [place]  [address]       Ph: [phone] or [phone]       Fax: [phone]   RxID:   [id number]

## 2010-12-04 ENCOUNTER — Encounter: Payer: Self-pay | Admitting: *Deleted

## 2010-12-04 NOTE — Progress Notes (Signed)
Addended by: Dorie Rank on: 12/04/2010 09:25 AM   Modules accepted: Orders

## 2010-12-05 ENCOUNTER — Ambulatory Visit: Payer: Self-pay | Admitting: Dietician

## 2011-01-20 LAB — GLUCOSE, CAPILLARY: Glucose-Capillary: 86 mg/dL (ref 70–99)

## 2011-02-11 LAB — GLUCOSE, CAPILLARY: Glucose-Capillary: 136 mg/dL — ABNORMAL HIGH (ref 70–99)

## 2011-02-15 ENCOUNTER — Telehealth: Payer: Self-pay | Admitting: *Deleted

## 2011-02-21 NOTE — Telephone Encounter (Signed)
Opened in error

## 2011-03-03 ENCOUNTER — Other Ambulatory Visit: Payer: Self-pay | Admitting: Internal Medicine

## 2011-03-12 NOTE — Assessment & Plan Note (Signed)
[facility]                                 ON-CALL NOTE   NAME:[name], Sherri Gill                       MRN:          [id number]  DATE:10/10/2007                            DOB:          11/14/1950    TELEPHONE MESSAGES   From Saturday, December 13th.   First message is Sherri Gill, [phone], a patient of Dr. Fabian Sharp,  date of birth 29-Sep-1951.   The patient called in because she has a bad cold.  Utilized over the  counter medication, Urgent Care or office if needed for other issues.     Jeffrey A. Tawanna Cooler, MD  Electronically Signed    JAT/MedQ  DD: 10/10/2007  DT: 10/11/2007  Job #: [id number]

## 2011-03-12 NOTE — Assessment & Plan Note (Signed)
[facility]                                 ON-CALL NOTE   NAME:[name], Sherri Gill                       MRN:          [id number]  DATE:10/10/2007                            DOB:          Jul 11, 1951    PRIMARY CARE PHYSICIAN:  Dr. Fabian Sharp.   The patient called x3. She says that she thinks she has the flu. Every  time I call back, there is an answering machine on. I left her a message  to see Dr. Fabian Sharp next week for evaluation or to come to the urgent care  over the weekend if she feels that she cannot wait.     Jeffrey A. Tawanna Cooler, MD  Electronically Signed    JAT/MedQ  DD: 10/10/2007  DT: 10/11/2007  Job #: [id number]

## 2011-03-12 NOTE — Assessment & Plan Note (Signed)
[facility]                                 ON-CALL NOTE   NAME:[name], [name]                       MRN:          [id number]  DATE:10/10/2007                            DOB:          12-07-1950    The patient has called 3 times. She is calling again and I call and all  I get is an answering machine. I advised the answering machine that if  she feels that she can see Dr. Fabian Sharp on Monday to do that, and if not,  go to the local urgent care.     Jeffrey A. Tawanna Cooler, MD  Electronically Signed    JAT/MedQ  DD: 10/11/2007  DT: 10/12/2007  Job #: [id number]

## 2011-03-15 NOTE — Assessment & Plan Note (Signed)
[facility]                                   ON-CALL NOTE   NAME:Sherri Gill, Sherri Gill                       MRN:          [id number]  DATE:08/16/2006                            DOB:          07-21-1951    PRIMARY CARE PHYSICIAN:  Neta Mends. Panosh, MD.   PHONE NUMBER:  [phone].   SUBJECTIVE:  The patient has tooth swelling and pain. The patient would like  pain medication called in. She has an appointment for a dentist on August 18, 2006.   ASSESSMENT/PLAN:  Patient asked to come to Saturday clinic. The patient is  unable to do so and she was told that we do not call in pain medication over  the phone. She was instructed to then either go to the hospital or Urgent  Care as other options if unable to come to the Saturday clinic.       Kerby Nora, MD      AB/MedQ  DD:  08/17/2006  DT:  08/18/2006  Job #:  [id number]   cc:   Neta Mends. Fabian Sharp, MD

## 2011-03-15 NOTE — Assessment & Plan Note (Signed)
[facility]                                 ON-CALL NOTE   NAME:[name], [name]                       MRN:          [id number]  DATE:01/10/2007                            DOB:          1951-05-13    PRIMARY CARE PHYSICIAN:  Neta Mends. Panosh, M.D.   Ms. Haye calls in today with sore throat, fever, and headache.   PLAN:  Given the possibility of Strep pharyngitis, she was advised to be  seen at [facility].  The patient agreed.     Leanne Chang, M.D.  Electronically Signed    LA/MedQ  DD: 01/10/2007  DT: 01/11/2007  Job #: [id number]

## 2011-04-02 ENCOUNTER — Other Ambulatory Visit (HOSPITAL_COMMUNITY): Payer: Self-pay | Admitting: Internal Medicine

## 2011-04-02 DIAGNOSIS — Z1231 Encounter for screening mammogram for malignant neoplasm of breast: Secondary | ICD-10-CM

## 2011-04-09 ENCOUNTER — Encounter: Payer: Self-pay | Admitting: Internal Medicine

## 2011-04-11 ENCOUNTER — Other Ambulatory Visit: Payer: Self-pay | Admitting: Internal Medicine

## 2011-04-11 ENCOUNTER — Ambulatory Visit (HOSPITAL_COMMUNITY)
Admission: RE | Admit: 2011-04-11 | Discharge: 2011-04-11 | Disposition: A | Discharge status: Home / Self Care | Payer: Self-pay | Source: Ambulatory Visit | Attending: Internal Medicine | Admitting: Internal Medicine

## 2011-04-11 ENCOUNTER — Other Ambulatory Visit (HOSPITAL_COMMUNITY): Payer: Self-pay | Admitting: Internal Medicine

## 2011-04-11 DIAGNOSIS — Z1231 Encounter for screening mammogram for malignant neoplasm of breast: Secondary | ICD-10-CM

## 2011-04-25 ENCOUNTER — Telehealth: Payer: Self-pay | Admitting: *Deleted

## 2011-04-25 NOTE — Telephone Encounter (Signed)
Pt called to report elevation in her BP.  She has the hospice nurse check her BP every week when she is in the home. They have been running 190/84, 188/88 She has an appointment on Monday with Dr Dierdre Searles. Hx of hypertension and pt on cozaar and lasix.  Can this wait until Monday? Please advise

## 2011-04-29 ENCOUNTER — Encounter: Payer: Self-pay | Admitting: Internal Medicine

## 2011-04-29 NOTE — Progress Notes (Unsigned)
  Subjective:    Patient ID: Sherri Gill, female    DOB: 03-01-1951, 60 y.o.   MRN: [id number]  HPI    Review of Systems     Objective:   Physical Exam        Assessment & Plan:

## 2011-04-29 NOTE — Telephone Encounter (Signed)
I am not very familiar with Sherri Gill but I believe there is not an urgent need for her to be seen as long as she is not having any neurological deficit.  THanks!

## 2011-05-08 ENCOUNTER — Ambulatory Visit: Payer: Self-pay | Admitting: Internal Medicine

## 2011-06-12 ENCOUNTER — Ambulatory Visit (INDEPENDENT_AMBULATORY_CARE_PROVIDER_SITE_OTHER): Payer: Self-pay | Admitting: Internal Medicine

## 2011-06-12 ENCOUNTER — Encounter: Payer: Self-pay | Admitting: Internal Medicine

## 2011-06-12 DIAGNOSIS — E785 Hyperlipidemia, unspecified: Secondary | ICD-10-CM

## 2011-06-12 DIAGNOSIS — E119 Type 2 diabetes mellitus without complications: Secondary | ICD-10-CM

## 2011-06-12 DIAGNOSIS — I10 Essential (primary) hypertension: Secondary | ICD-10-CM

## 2011-06-12 NOTE — Progress Notes (Signed)
  Subjective:    Patient ID: [name], female    DOB: October 29, 1950, 60 y.o.   MRN: [id number]  HPI  Patient is 60 year old female with past medical history outlined below who presented to clinic for regular followup on her blood pressure, diabetes, cholesterol. She reports compliance with current medication but mentions running out of her blood pressure medications. She reports compliance with recommended diet and exercise. She denies recent sicknesses or hospitalizations, as is the chest and shortness of breath, no abdominal urinary concerns, no weakness or dizziness, no headaches or visual changes.   Review of Systems Constitutional: Denies fever, chills, diaphoresis, appetite change and fatigue.  HEENT: Denies photophobia, eye pain, redness, hearing loss, ear pain, congestion, sore throat, rhinorrhea, sneezing, mouth sores, trouble swallowing, neck pain, neck stiffness and tinnitus.   Respiratory: Denies SOB, DOE, cough, chest tightness,  and wheezing.   Cardiovascular: Denies chest pain, palpitations and leg swelling.  Gastrointestinal: Denies nausea, vomiting, abdominal pain, diarrhea, constipation, blood in stool and abdominal distention.  Genitourinary: Denies dysuria, urgency, frequency, hematuria, flank pain and difficulty urinating.  Musculoskeletal: Denies myalgias, back pain, joint swelling, arthralgias and gait problem.  Skin: Denies pallor, rash and wound.  Neurological: Denies dizziness, seizures, syncope, weakness, light-headedness, numbness and headaches.  Hematological: Denies adenopathy. Easy bruising, personal or family bleeding history  Psychiatric/Behavioral: Denies suicidal ideation, mood changes, confusion, nervousness, sleep disturbance and agitation      Objective:   Physical Exam Constitutional: Vital signs reviewed.  Patient is a well-developed and well-nourished  in no acute distress and cooperative with exam. Alert and oriented x3.  Head: Normocephalic and  atraumatic Ear: TM normal bilaterally Mouth: no erythema or exudates, MMM Eyes: PERRL, EOMI, conjunctivae normal, No scleral icterus.  Neck: Supple, Trachea midline normal ROM, No JVD, mass, thyromegaly, or carotid bruit present.  Cardiovascular: RRR, S1 normal, S2 normal, no MRG, pulses symmetric and intact bilaterally Pulmonary/Chest: CTAB, no wheezes, rales, or rhonchi Abdominal: Soft. Non-tender, non-distended, bowel sounds are normal, no masses, organomegaly, or guarding present.  GU: no CVA tenderness Musculoskeletal: No joint deformities, erythema, or stiffness, ROM full and no nontender Hematology: no cervical, inginal, or axillary adenopathy.  Neurological: A&O x3, Strenght is normal and symmetric bilaterally, cranial nerve II-XII are grossly intact, no focal motor deficit, sensory intact to light touch bilaterally.  Skin: Warm, dry and intact. No rash, cyanosis, or clubbing.  Psychiatric: Normal mood and affect. speech and behavior is normal. Judgment and thought content normal. Cognition and memory are normal.          Assessment & Plan:

## 2011-06-12 NOTE — Assessment & Plan Note (Signed)
Well controlled on current medication regimen. We have discussed the importance of exercise and diet and pt verbalized understanding. I have examined her feet and the results of exam are within normal limits.

## 2011-06-12 NOTE — Assessment & Plan Note (Signed)
Will check FLP today and will readjust the regimen as indicated.

## 2011-06-12 NOTE — Assessment & Plan Note (Signed)
Well controlled on current medication regimen. Will continue the same. Will check electrolyte panel today.

## 2011-06-13 LAB — COMPREHENSIVE METABOLIC PANEL
BUN: 13 mg/dL (ref 6–23)
CO2: 28 mEq/L (ref 19–32)
Creat: 0.7 mg/dL (ref 0.50–1.10)
Glucose, Bld: 105 mg/dL — ABNORMAL HIGH (ref 70–99)
Sodium: 139 mEq/L (ref 135–145)
Total Bilirubin: 0.3 mg/dL (ref 0.3–1.2)
Total Protein: 7.2 g/dL (ref 6.0–8.3)

## 2011-06-13 LAB — LIPID PANEL
Cholesterol: 178 mg/dL (ref 0–200)
Triglycerides: 150 mg/dL — ABNORMAL HIGH (ref <150)
VLDL: 30 mg/dL (ref 0–40)

## 2011-06-26 ENCOUNTER — Other Ambulatory Visit: Payer: Self-pay | Admitting: Internal Medicine

## 2011-06-26 DIAGNOSIS — I10 Essential (primary) hypertension: Secondary | ICD-10-CM

## 2011-06-26 DIAGNOSIS — E785 Hyperlipidemia, unspecified: Secondary | ICD-10-CM

## 2011-06-26 MED ORDER — FUROSEMIDE 20 MG PO TABS: 20.0000 mg | ORAL_TABLET | Freq: Every day | ORAL | Status: DC

## 2011-07-03 ENCOUNTER — Other Ambulatory Visit: Payer: Self-pay | Admitting: Internal Medicine

## 2011-07-12 ENCOUNTER — Telehealth: Payer: Self-pay | Admitting: *Deleted

## 2011-07-12 NOTE — Telephone Encounter (Signed)
I have never seen this patient in clinic and definitely have never called her pharmacy regarding any prescription.  Per her last OV with Dr. Aldine Contes, metformin had not been discontinued.

## 2011-07-12 NOTE — Telephone Encounter (Signed)
Pt called stating she went to pharmacy to pick up metformin and they told her therapy was discontinued.  Pt was not aware of this and concerned.  She is out of meds.  I called [facility] and told them metformin was approved on 9/5 by Dr Arvilla Market. Pharmacist states on 9/6 Dr Arvilla Market stopped metformin.  This is not noted in chart.  Please advise. Pt # [phone]   I called in 1 month refill so pt will have over the weekend per Dr Aundria Rud.

## 2011-07-15 NOTE — Telephone Encounter (Signed)
Pt informed to continue metformin

## 2011-07-22 LAB — POCT URINALYSIS DIP (DEVICE)
Bilirubin Urine: NEGATIVE
Nitrite: NEGATIVE
Operator id: 200941
pH: 6.5

## 2011-09-05 ENCOUNTER — Other Ambulatory Visit: Payer: Self-pay | Admitting: Internal Medicine

## 2011-09-05 DIAGNOSIS — I10 Essential (primary) hypertension: Secondary | ICD-10-CM

## 2011-09-06 MED ORDER — CITALOPRAM HYDROBROMIDE 20 MG PO TABS: 20.0000 mg | ORAL_TABLET | Freq: Every day | ORAL | Status: DC

## 2011-09-17 ENCOUNTER — Telehealth: Payer: Self-pay | Admitting: *Deleted

## 2011-09-17 NOTE — Telephone Encounter (Signed)
Please advise pt her symptoms are likely from a viral infection, will get better with time, and that antibiotics don't work to treat viral infection.  Advise her to take otc tylenol as needed for relief of headache and fever and to drink plenty of water.  She may use OTC Mucinex and/or robitussin cough syrup for relief of cough/congestion.  If her symptoms do not improved after 7-10 days, she needs to be seen at the office for further evaluation.   Thank you!

## 2011-09-17 NOTE — Telephone Encounter (Signed)
Returned pt's call. States she feels like she has the flu - having chills, headaches, coughing up thick mucous,soreness over rib cage area; no fever.  Requesting something to be call in to the pharmacy - [facility] [address]. 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 Please advise  Thanks

## 2011-09-17 NOTE — Telephone Encounter (Signed)
Pt was called and instructed per Dr. Arvilla Market' orders; she agreed and will call for appt if symptoms do not improve.

## 2012-01-13 ENCOUNTER — Encounter: Payer: Self-pay | Admitting: Internal Medicine

## 2012-03-05 ENCOUNTER — Telehealth: Payer: Self-pay | Admitting: Dietician

## 2012-03-05 NOTE — Telephone Encounter (Signed)
Patient sent a letter asking her to give [place] a call to be seen by her pcp

## 2012-03-09 ENCOUNTER — Other Ambulatory Visit: Payer: Self-pay | Admitting: *Deleted

## 2012-03-10 MED ORDER — CITALOPRAM HYDROBROMIDE 20 MG PO TABS: 20.0000 mg | ORAL_TABLET | Freq: Every day | ORAL | Status: DC

## 2012-03-16 ENCOUNTER — Other Ambulatory Visit: Payer: Self-pay | Admitting: Internal Medicine

## 2012-03-19 ENCOUNTER — Other Ambulatory Visit: Payer: Self-pay | Admitting: *Deleted

## 2012-03-19 MED ORDER — FUROSEMIDE 20 MG PO TABS: 20.0000 mg | ORAL_TABLET | Freq: Every day | ORAL | Status: DC

## 2012-03-19 NOTE — Telephone Encounter (Signed)
Refill denied.  Patient needs to be seen before refills will be provided; she has not been seen since 05/2011.  Please call her to schedule an appt for routine visit.  Thank you!

## 2012-03-19 NOTE — Telephone Encounter (Signed)
Pt is out of town.  She will make appointment on her return in June.  Please refill for 1 month.

## 2012-03-19 NOTE — Telephone Encounter (Signed)
Rx called in to pharmacy. 

## 2012-03-19 NOTE — Telephone Encounter (Signed)
Refill for 1 month submitted to pts [facility].  Please inform her rx submitted.  Thank you!

## 2012-03-19 NOTE — Telephone Encounter (Signed)
Pharmacy aware med is denied per Dr Arvilla Market and front pool aware pt needs appt.

## 2012-03-24 ENCOUNTER — Encounter: Payer: Self-pay | Admitting: Internal Medicine

## 2012-04-13 ENCOUNTER — Other Ambulatory Visit: Payer: Self-pay | Admitting: *Deleted

## 2012-04-13 DIAGNOSIS — I10 Essential (primary) hypertension: Secondary | ICD-10-CM

## 2012-04-13 MED ORDER — LOSARTAN POTASSIUM 25 MG PO TABS: 25.0000 mg | ORAL_TABLET | Freq: Every day | ORAL | Status: DC

## 2012-04-13 MED ORDER — CITALOPRAM HYDROBROMIDE 20 MG PO TABS: 20.0000 mg | ORAL_TABLET | Freq: Every day | ORAL | Status: DC

## 2012-04-13 MED ORDER — METFORMIN HCL 1000 MG PO TABS: 500.0000 mg | ORAL_TABLET | Freq: Two times a day (BID) | ORAL | Status: DC

## 2012-04-13 MED ORDER — FUROSEMIDE 20 MG PO TABS: 20.0000 mg | ORAL_TABLET | Freq: Every day | ORAL | Status: DC

## 2012-04-13 NOTE — Telephone Encounter (Signed)
Pt made appt 04/23/12 with Dr Eben Burow.

## 2012-04-13 NOTE — Telephone Encounter (Signed)
Reviewed chart. Last appt 05/2011. A1C great. BP had been great but increased as weight increased. CMP and lipids OK 05/2011. Prior refills indicated pt needed appt before more refills given. Since < 1 yr and creatinine and A1C great, will refill 2 months so that pt has time to make and keep appt.

## 2012-04-23 ENCOUNTER — Ambulatory Visit: Payer: Self-pay | Admitting: Internal Medicine

## 2012-06-11 ENCOUNTER — Ambulatory Visit (INDEPENDENT_AMBULATORY_CARE_PROVIDER_SITE_OTHER): Payer: Self-pay | Admitting: Internal Medicine

## 2012-06-11 ENCOUNTER — Encounter: Payer: Self-pay | Admitting: Internal Medicine

## 2012-06-11 VITALS — BP 156/77 | HR 80 | Temp 97.3°F | Ht 63.0 in | Wt 221.1 lb

## 2012-06-11 DIAGNOSIS — E785 Hyperlipidemia, unspecified: Secondary | ICD-10-CM

## 2012-06-11 DIAGNOSIS — E669 Obesity, unspecified: Secondary | ICD-10-CM

## 2012-06-11 DIAGNOSIS — G4700 Insomnia, unspecified: Secondary | ICD-10-CM

## 2012-06-11 DIAGNOSIS — E119 Type 2 diabetes mellitus without complications: Secondary | ICD-10-CM

## 2012-06-11 DIAGNOSIS — Z79899 Other long term (current) drug therapy: Secondary | ICD-10-CM

## 2012-06-11 DIAGNOSIS — E66812 Obesity, class 2: Secondary | ICD-10-CM

## 2012-06-11 DIAGNOSIS — H44009 Unspecified purulent endophthalmitis, unspecified eye: Secondary | ICD-10-CM

## 2012-06-11 DIAGNOSIS — I10 Essential (primary) hypertension: Secondary | ICD-10-CM

## 2012-06-11 HISTORY — DX: Obesity, unspecified: E66.9

## 2012-06-11 HISTORY — DX: Obesity, class 2: E66.812

## 2012-06-11 HISTORY — DX: Insomnia, unspecified: G47.00

## 2012-06-11 LAB — LIPID PANEL
Cholesterol: 191 mg/dL (ref 0–200)
LDL Cholesterol: 114 mg/dL — ABNORMAL HIGH (ref 0–99)
Triglycerides: 100 mg/dL (ref <150)

## 2012-06-11 LAB — CBC
MCHC: 33.2 g/dL (ref 30.0–36.0)
Platelets: 373 10*3/uL (ref 150–400)
RDW: 15.6 % — ABNORMAL HIGH (ref 11.5–15.5)

## 2012-06-11 MED ORDER — OFLOXACIN 0.3 % OP SOLN: OPHTHALMIC | Status: DC

## 2012-06-11 NOTE — Assessment & Plan Note (Signed)
Well controlled DM type II with Metformin. A1C 7.2  -continue current therapy - follow up in 3 months - referral to ophthalmology  - foot exam done

## 2012-06-11 NOTE — Patient Instructions (Addendum)
- will follow up in 3 months - weight loss and diabetes.  Calorie Counting Diet A calorie counting diet requires you to eat the number of calories that are right for you in a day. Calories are the measurement of how much energy you get from the food you eat. Eating the right amount of calories is important for staying at a healthy weight. If you eat too many calories, your body will store them as fat and you may gain weight. If you eat too few calories, you may lose weight. Counting the number of calories you eat during a day will help you know if you are eating the right amount. A Registered Dietitian can determine how many calories you need in a day. The amount of calories needed varies from person to person. If your goal is to lose weight, you will need to eat fewer calories. Losing weight can benefit you if you are overweight or have health problems such as heart disease, high blood pressure, or diabetes. If your goal is to gain weight, you will need to eat more calories. Gaining weight may be necessary if you have a certain health problem that causes your body to need more energy. TIPS Whether you are increasing or decreasing the number of calories you eat during a day, it may be hard to get used to changes in what you eat and drink. The following are tips to help you keep track of the number of calories you eat.  Measure foods at home with measuring cups. This helps you know the amount of food and number of calories you are eating.   Restaurants often serve food in amounts that are larger than 1 serving. While eating out, estimate how many servings of a food you are given. For example, a serving of cooked rice is  cup or about the size of half of a fist. Knowing serving sizes will help you be aware of how much food you are eating at restaurants.   Ask for smaller portion sizes or child-size portions at restaurants.   Plan to eat half of a meal at a restaurant. Take the rest home or share the  other half with a friend.   Read the Nutrition Facts panel on food labels for calorie content and serving size. You can find out how many servings are in a package, the size of a serving, and the number of calories each serving has.   For example, a package might contain 3 cookies. The Nutrition Facts panel on that package says that 1 serving is 1 cookie. Below that, it will say there are 3 servings in the container. The calories section of the Nutrition Facts label says there are 90 calories. This means there are 90 calories in 1 cookie (1 serving). If you eat 1 cookie you have eaten 90 calories. If you eat all 3 cookies, you have eaten 270 calories (3 servings x 90 calories = 270 calories).  The list below tells you how big or small some common portion sizes are.  1 oz.........4 stacked dice.   3 oz........Marland KitchenDeck of cards.   1 tsp.......Marland KitchenTip of little finger.   1 tbs......Marland KitchenMarland KitchenThumb.   2 tbs.......Marland KitchenGolf ball.    cup......Marland KitchenHalf of a fist.   1 cup.......Marland KitchenA fist.  KEEP A FOOD LOG Write down every food item you eat, the amount you eat, and the number of calories in each food you eat during the day. At the end of the day, you can add up the  total number of calories you have eaten. It may help to keep a list like the one below. Find out the calorie information by reading the Nutrition Facts panel on food labels. Breakfast  Bran cereal (1 cup, 110 calories).   Fat-free milk ( cup, 45 calories).  Snack  Apple (1 medium, 80 calories).  Lunch  Spinach (1 cup, 20 calories).   Tomato ( medium, 20 calories).   Chicken breast strips (3 oz, 165 calories).   Shredded cheddar cheese ( cup, 110 calories).   Light [place] dressing (2 tbs, 60 calories).   Whole-wheat bread (1 slice, 80 calories).   Tub margarine (1 tsp, 35 calories).   Vegetable soup (1 cup, 160 calories).  Dinner  Pork chop (3 oz, 190 calories).   Brown rice (1 cup, 215 calories).   Steamed broccoli ( cup, 20  calories).   Strawberries (1  cup, 65 calories).   Whipped cream (1 tbs, 50 calories).  Daily Calorie Total: 1425 Document Released: 10/14/2005 Document Revised: 10/03/2011 Document Reviewed: 04/10/2007 [facility] Patient Information 2012 [place], [place].  Exercise to Lose Weight Exercise and a healthy diet may help you lose weight. Your doctor may suggest specific exercises. EXERCISE IDEAS AND TIPS  Choose low-cost things you enjoy doing, such as walking, bicycling, or exercising to workout videos.   Take stairs instead of the elevator.   Walk during your lunch break.   Park your car further away from work or school.   Go to a gym or an exercise class.   Start with 5 to 10 minutes of exercise each day. Build up to 30 minutes of exercise 4 to 6 days a week.   Wear shoes with good support and comfortable clothes.   Stretch before and after working out.   Work out until you breathe harder and your heart beats faster.   Drink extra water when you exercise.   Do not do so much that you hurt yourself, feel dizzy, or get very short of breath.  Exercises that burn about 150 calories:  Running 1  miles in 15 minutes.   Playing volleyball for 45 to 60 minutes.   Washing and waxing a car for 45 to 60 minutes.   Playing touch football for 45 minutes.   Walking 1  miles in 35 minutes.   Pushing a stroller 1  miles in 30 minutes.   Playing basketball for 30 minutes.   Raking leaves for 30 minutes.   Bicycling 5 miles in 30 minutes.   Walking 2 miles in 30 minutes.   Dancing for 30 minutes.   Shoveling snow for 15 minutes.   Swimming laps for 20 minutes.   Walking up stairs for 15 minutes.   Bicycling 4 miles in 15 minutes.   Gardening for 30 to 45 minutes.   Jumping rope for 15 minutes.   Washing windows or floors for 45 to 60 minutes.  Document Released: 11/16/2010 Document Revised: 10/03/2011 Document Reviewed: 11/16/2010 [facility] Patient Information  2012 [place], [place].

## 2012-06-11 NOTE — Assessment & Plan Note (Signed)
Right eye infection.  Patient is allergic to PCN  -Ofloxacin eye drops

## 2012-06-11 NOTE — Progress Notes (Signed)
Subjective:   Patient ID: Sherri Gill female   DOB: May 12, 1951 61 y.o.   MRN: [id number]  HPI: [name] is a 61 y.o. woman with PMH pf HTN, HLD, DM type II, and depression who presents to the clinic for multiple issues. She has not had any office follow up for one year. She presents to the clinic with multiple questions.   1. Right eye infection Patient states that she noticed right redness, yellowsish discharge especially in the morning for 5 years. Denies eye pain or headache. Denies visual changes or double vision. She states that she has had right eye infection in the past which was treated with ABX eye drops. She is here for evaluation.   2. HTN, patient is noted to have hig blood pressure at 156/77 today. She states that she did not take her Lasix "because I do not want to go to bathroom when I am out." she does not check her BP at home. She is compliant with her medical treatment otherwise.   3. DM, compliant with her DM treatment. A1C is 7.2 today. Will send referral for ophthalmology However, patient insisted for me to give her a weight loss pill for weight loss to control her DM. She heard from a relative about such treatment but does not remember the medication name. She would like for me to prescribe this medication for her. She also insisted on discussing with me about possible diet pills for her and so on. I instructed her to call back to tell the nurse about the medication name, and suggest that she make another office follow up with the provider for detailed discussion.    4. Obesity, BMI 39  Patient is interested in weight loss. She would like to prescribe her diet pill that helps her DM control and weight loss. I discussed with her about diet and exercise plan.  5. Insomnia She would like to discuss with me about the usage of Melatonin. She reports using Melatonin but unable to fall into sleep. She does not know the dosage of Melatonin.  She is instructed to  call back about the dosage.  Patient continues to try to discuss other lifetime concerns and it seems that she tries to discuss everything with me in ONE office visit. It seems that finance is a concern for her.      Past Medical History  Diagnosis Date  . Depression   . Diabetes mellitus   . GERD (gastroesophageal reflux disease)   . Hyperlipidemia   . Hypertension    Current Outpatient Prescriptions  Medication Sig Dispense Refill  . aspirin (ANACIN) 81 MG EC tablet Take 81 mg by mouth daily.        Jerrell Belfast Lancet Super Thin 30G MISC Use as directed to check blood sugar once a day       . citalopram (CELEXA) 20 MG tablet Take 1 tablet (20 mg total) by mouth daily.  30 tablet  1  . furosemide (LASIX) 20 MG tablet Take 1 tablet (20 mg total) by mouth daily.  30 tablet  1  . losartan (COZAAR) 25 MG tablet Take 1 tablet (25 mg total) by mouth daily.  30 tablet  1  . metFORMIN (GLUCOPHAGE) 1000 MG tablet Take 1,000 mg by mouth 2 (two) times daily with a meal.      . pravastatin (PRAVACHOL) 40 MG tablet Take 1 tablet (40 mg total) by mouth daily.  30 tablet  11  . DISCONTD: metFORMIN (  GLUCOPHAGE) 1000 MG tablet Take 0.5 tablets (500 mg total) by mouth 2 (two) times daily with a meal.  60 tablet  1   Family History  Problem Relation Age of Onset  . Dementia Mother    History   Social History  . Marital Status: Single    Spouse Name: N/A    Number of Children: N/A  . Years of Education: N/A   Social History Main Topics  . Smoking status: Never Smoker   . Smokeless tobacco: None  . Alcohol Use: No  . Drug Use: None  . Sexually Active: None   Other Topics Concern  . 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  Currently no health insurance.   Financial assistance application completed. Patient does not qualify for assistance - over income.Per Rudell Cobb 03/29/2010   Review of Systems: Review  of Systems:  Constitutional:  Denies fever, chills, diaphoresis, appetite change and fatigue.   HEENT:  Denies congestion, sore throat, rhinorrhea, sneezing, mouth sores, trouble swallowing, neck pain , right eye redness noted  Respiratory:  Denies SOB, DOE, cough, and wheezing.   Cardiovascular:  Denies palpitations and leg swelling.   Gastrointestinal:  Denies nausea, vomiting, abdominal pain, diarrhea, constipation, blood in stool and abdominal distention.   Genitourinary:  Denies dysuria, urgency, frequency, hematuria, flank pain and difficulty urinating.   Musculoskeletal:  Denies myalgias, back pain, joint swelling, arthralgias and gait problem.   Skin:  Denies pallor, rash and wound.   Neurological:  Denies dizziness, seizures, syncope, weakness, light-headedness, numbness and headaches.    .   Objective:  Physical Exam: Filed Vitals:   06/11/12 1522  BP: 156/77  Pulse: 80  Temp: 97.3 F (36.3 C)  TempSrc: Oral  Height: 5\' 3"  (1.6 m)  Weight: 221 lb 1.6 oz (100.29 kg)   General: Obesity. alert, well-developed, and cooperative to examination.  Head: normocephalic and atraumatic.  Eyes: vision grossly intact, pupils equal, pupils round, pupils reactive to light, no injection and anicteric.  Mouth: pharynx pink and moist, no erythema, and no exudates.  Neck: supple, full ROM, no thyromegaly, no JVD, and no carotid bruits.  Lungs: normal respiratory effort, no accessory muscle use, normal breath sounds, no crackles, and no wheezes. Heart: normal rate, regular rhythm, no murmur, no gallop, and no rub.  Abdomen: soft, non-tender, normal bowel sounds, no distention, no guarding, no rebound tenderness, no hepatomegaly, and no splenomegaly.  Msk: no joint swelling, no joint warmth, and no redness over joints.  Pulses: 2+ DP/PT pulses bilaterally Extremities: No cyanosis, clubbing. B/L Trace edema Neurologic: alert & oriented X3, cranial nerves II-XII intact, strength normal in all  extremities, sensation intact to light touch, and gait normal.  Skin: turgor normal and no rashes.  Psych: Oriented X3, memory intact for recent and remote, normally interactive, good eye contact, not anxious appearing, and not depressed appearing.   Assessment & Plan:

## 2012-06-11 NOTE — Assessment & Plan Note (Addendum)
High BP at 156/77 today, but she did not take her Lasix  - instruct her to take all her meds before office visits.  - monitor BP daily at home - CMP today

## 2012-06-11 NOTE — Assessment & Plan Note (Signed)
Agree with Melatonin. Patient is instructed to call back to the clinic to report the dosage to the nurse.

## 2012-06-11 NOTE — Assessment & Plan Note (Signed)
BMI 39  - weight loss discussed. Will give her materials for diet and weight control.

## 2012-06-12 LAB — COMPLETE METABOLIC PANEL WITH GFR
Albumin: 4.1 g/dL (ref 3.5–5.2)
CO2: 31 mEq/L (ref 19–32)
GFR, Est African American: >89 mL/min
GFR, Est Non African American: >89 mL/min
Glucose, Bld: 81 mg/dL (ref 70–99)
Potassium: 4.6 mEq/L (ref 3.5–5.3)
Sodium: 138 mEq/L (ref 135–145)
Total Protein: 7.1 g/dL (ref 6.0–8.3)

## 2012-06-17 MED ORDER — PRAVASTATIN SODIUM 40 MG PO TABS: 40.0000 mg | ORAL_TABLET | Freq: Every day | ORAL | Status: DC

## 2012-06-17 MED ORDER — METFORMIN HCL 1000 MG PO TABS: 1000.0000 mg | ORAL_TABLET | Freq: Two times a day (BID) | ORAL | Status: DC

## 2012-06-17 NOTE — Addendum Note (Signed)
Addended byDierdre Searles, Evey Mcmahan on: 06/17/2012 05:03 PM   Modules accepted: Orders

## 2012-07-03 ENCOUNTER — Telehealth: Payer: Self-pay | Admitting: *Deleted

## 2012-07-03 NOTE — Telephone Encounter (Signed)
CALLED AND LEFT VOICE MESSAGE FOR PATIENT TO CALL/ PATIENT NEEDS TO SEE [facility]/ UNABLE TO MAKE EYE APPT. Theodis Kinsel NT [phone] 4:46PM

## 2012-07-07 ENCOUNTER — Encounter: Payer: Self-pay | Admitting: *Deleted

## 2012-07-21 ENCOUNTER — Other Ambulatory Visit: Payer: Self-pay | Admitting: Internal Medicine

## 2012-07-21 MED ORDER — CITALOPRAM HYDROBROMIDE 20 MG PO TABS: 20.0000 mg | ORAL_TABLET | Freq: Every day | ORAL | Status: DC

## 2012-08-06 NOTE — Addendum Note (Signed)
Addended by: Neomia Dear on: 08/06/2012 06:43 PM   Modules accepted: Orders

## 2012-08-18 ENCOUNTER — Ambulatory Visit (INDEPENDENT_AMBULATORY_CARE_PROVIDER_SITE_OTHER): Payer: Self-pay | Admitting: Internal Medicine

## 2012-08-18 ENCOUNTER — Ambulatory Visit (HOSPITAL_COMMUNITY)
Admission: RE | Admit: 2012-08-18 | Discharge: 2012-08-18 | Disposition: A | Discharge status: Home / Self Care | Payer: Self-pay | Source: Ambulatory Visit | Attending: Internal Medicine | Admitting: Internal Medicine

## 2012-08-18 ENCOUNTER — Encounter: Payer: Self-pay | Admitting: Internal Medicine

## 2012-08-18 VITALS — BP 171/87 | HR 98 | Temp 98.5°F | Ht 63.0 in | Wt 223.1 lb

## 2012-08-18 DIAGNOSIS — R059 Cough, unspecified: Secondary | ICD-10-CM | POA: Insufficient documentation

## 2012-08-18 DIAGNOSIS — E119 Type 2 diabetes mellitus without complications: Secondary | ICD-10-CM

## 2012-08-18 DIAGNOSIS — J189 Pneumonia, unspecified organism: Secondary | ICD-10-CM | POA: Insufficient documentation

## 2012-08-18 DIAGNOSIS — R05 Cough: Secondary | ICD-10-CM

## 2012-08-18 DIAGNOSIS — R509 Fever, unspecified: Secondary | ICD-10-CM | POA: Insufficient documentation

## 2012-08-18 MED ORDER — DOXYCYCLINE HYCLATE 100 MG PO TABS: 100.0000 mg | ORAL_TABLET | Freq: Two times a day (BID) | ORAL | Status: AC

## 2012-08-18 MED ORDER — BENZONATATE 100 MG PO CAPS: 100.0000 mg | ORAL_CAPSULE | Freq: Four times a day (QID) | ORAL | Status: DC | PRN

## 2012-08-18 NOTE — Progress Notes (Signed)
  Subjective:    Patient ID: Sherri Gill, female    DOB: 14-Aug-1951, 61 y.o.   MRN: [id number]  HPI Presents for acute visit with c/o cough associated with intermittent fever. Hx significant for diabetes, hypertension and GERD. Has been taking Tylenol and Mucinex and doing nasal irrigation. Also reports that tongue is coated with a yellowish film in the morning. No h/o COPD, asthma, no cigarette smoking presently but had socially smoked many years ago. Reports that there have been sick contacts at her work with people coughing.  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  Has not received the flu shot.   Review of Systems  Constitutional: Positive for fever and fatigue.  HENT: Positive for congestion, rhinorrhea and sinus pressure. Negative for sneezing and neck stiffness.   Respiratory: Positive for cough and wheezing. Negative for shortness of breath.   Cardiovascular: Negative for chest pain and palpitations.  Gastrointestinal: Positive for nausea. Negative for vomiting, abdominal pain, diarrhea, constipation and blood in stool.       Nausea after coughing spell       Objective:   Physical Exam  Constitutional: She is oriented to person, place, and time. She appears well-developed and well-nourished. No distress.  HENT:  Head: Normocephalic and atraumatic.  Eyes: Conjunctivae normal and EOM are normal. Pupils are equal, round, and reactive to light.  Cardiovascular: Normal rate, regular rhythm and normal heart sounds.   Pulmonary/Chest: Effort normal. She has wheezes.       Crackles in bilateral lower lobes, scattered rhonchi  Abdominal: Soft. Bowel sounds are normal.  Neurological: She is alert and oriented to person, place, and time.  Skin: Skin is warm and dry.  Psychiatric: She has a normal mood and affect.          Assessment & Plan:  1. 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

## 2012-08-18 NOTE — Assessment & Plan Note (Addendum)
Associated with fever, chills and body aches x 1 week.  Sputum yellow and nasal secretions with some blood tinge.  Has been taking Mucinex, Tylenol and using nasal irrigation without relief. -CXR pending

## 2012-08-18 NOTE — Patient Instructions (Signed)
We have prescribed doxycycline antibiotic for your respiratory infection. If the chest XRay shows pneumonia, we will contact you.  Otherwise this is likely acute bronchitis. If your symptoms do not get better in a couple of days, call the clinic to be re-evaluated. Follow-up with your PCP as scheduled.

## 2012-08-28 ENCOUNTER — Ambulatory Visit (INDEPENDENT_AMBULATORY_CARE_PROVIDER_SITE_OTHER): Payer: Self-pay | Admitting: Internal Medicine

## 2012-08-28 ENCOUNTER — Encounter: Payer: Self-pay | Admitting: Internal Medicine

## 2012-08-28 VITALS — BP 142/77 | HR 84 | Temp 98.0°F | Resp 20 | Ht 63.0 in | Wt 224.4 lb

## 2012-08-28 DIAGNOSIS — J189 Pneumonia, unspecified organism: Secondary | ICD-10-CM

## 2012-08-28 DIAGNOSIS — Z23 Encounter for immunization: Secondary | ICD-10-CM

## 2012-08-28 DIAGNOSIS — D649 Anemia, unspecified: Secondary | ICD-10-CM

## 2012-08-28 LAB — GLUCOSE, CAPILLARY: Glucose-Capillary: 138 mg/dL — ABNORMAL HIGH (ref 70–99)

## 2012-08-28 MED ORDER — DEXTROMETHORPHAN-GUAIFENESIN 10-100 MG/5ML PO SYRP: 5.0000 mL | ORAL_SOLUTION | Freq: Two times a day (BID) | ORAL | Status: DC

## 2012-08-28 NOTE — Patient Instructions (Signed)
Please come back to the clinic to see Dr Earlene Plater for evaluation of your low blood level  Please continue taking your Robitussin as needed. Please take you temp at home when your fill hot flushes. Bring the records to clinic on your next visit You have been given a flu shot and a pneumonia vaccine today.

## 2012-08-29 ENCOUNTER — Encounter: Payer: Self-pay | Admitting: Internal Medicine

## 2012-08-29 DIAGNOSIS — D509 Iron deficiency anemia, unspecified: Secondary | ICD-10-CM | POA: Insufficient documentation

## 2012-08-29 DIAGNOSIS — D649 Anemia, unspecified: Secondary | ICD-10-CM

## 2012-08-29 HISTORY — DX: Anemia, unspecified: D64.9

## 2012-08-29 NOTE — Progress Notes (Signed)
Patient ID: Sherri Gill, female   DOB: 16-Sep-1951, 61 y.o.   MRN: [id number]  Subjective:   Patient ID: Sherri Gill female   DOB: June 12, 1951 61 y.o.   MRN: [id number]  HPI: [name] is a 61 y.o. with past medical history significant for depression, type 2 diabetes,GERD, hyperlipidemia, and, hypertension, presents for followup appointment for community-acquired pneumonia  She was evaluated for cough last week and diagnosed with community-acquired pneumonia with right middle lobar infiltrates on CXR. She was started on a course of doxycycline 100 mg bid for one week, which she has completed. She reports that symptoms have improved; she does not have fever or chills, but continues to have persistent cough which improves with Robutusin. However, she reports that she experienced 'hot flushes' 2 days ago but she did not take her temperature. She also reports feeling fatigued, especially after these recurrent coughs. She is still producing some phlegm, but it is now changed from being thick to light and yellow. She denies chest pain. Her appetite has improved compared to last week.   Past Medical History  Diagnosis Date  . Depression   . Diabetes mellitus   . GERD (gastroesophageal reflux disease)   . Hyperlipidemia   . Hypertension    Current Outpatient Prescriptions  Medication Sig Dispense Refill  . aspirin (ANACIN) 81 MG EC tablet Take 81 mg by mouth daily.        Jerrell Belfast Lancet Super Thin 30G MISC Use as directed to check blood sugar once a day       . citalopram (CELEXA) 20 MG tablet Take 1 tablet (20 mg total) by mouth daily.  90 tablet  3  . doxycycline (VIBRA-TABS) 100 MG tablet Take 1 tablet (100 mg total) by mouth 2 (two) times daily.  14 tablet  0  . furosemide (LASIX) 20 MG tablet Take 1 tablet (20 mg total) by mouth daily.  30 tablet  1  . losartan (COZAAR) 25 MG tablet TAKE ONE TABLET BY MOUTH EVERY DAY  90 tablet  3  . metFORMIN (GLUCOPHAGE) 1000 MG tablet Take 1  tablet (1,000 mg total) by mouth 2 (two) times daily with a meal.  30 tablet  11  . pravastatin (PRAVACHOL) 40 MG tablet Take 1 tablet (40 mg total) by mouth daily.  30 tablet  11  . benzonatate (TESSALON PERLES) 100 MG capsule Take 1 capsule (100 mg total) by mouth every 6 (six) hours as needed for cough.  30 capsule  1  . Dextromethorphan-Guaifenesin (ROBITUSSIN DM) 10-100 MG/5ML liquid Take 5 mLs by mouth every 12 (twelve) hours.  240 mL  0  . losartan (COZAAR) 25 MG tablet Take 1 tablet (25 mg total) by mouth daily.  30 tablet  1  . ofloxacin (OCUFLOX) 0.3 % ophthalmic solution 1-2 gtt every 2-4 hours x 2 days and then 1-2 gtts QID x 5 days.  5 mL  0   Family History  Problem Relation Age of Onset  . Dementia Mother    History   Social History  . Marital Status: Single    Spouse Name: N/A    Number of Children: N/A  . Years of Education: N/A   Social History Main Topics  . Smoking status: Never Smoker   . Smokeless tobacco: None  . Alcohol Use: No  . Drug Use: None  . Sexually Active: None   Other Topics Concern  . None   Social History Narrative   She is  still care taking her elderly mother who has dementia and works part time at [employer].  Currently no health insurance.   Financial assistance application completed. Patient does not qualify for assistance - over income.Per Rudell Cobb 03/29/2010   Review of Systems: Review of Systems  Constitutional: Negative for weight loss and diaphoresis.  HENT: Negative.   Eyes: Negative.   Cardiovascular: Negative for chest pain, palpitations, orthopnea, claudication, leg swelling and PND.  Gastrointestinal: Negative.   Genitourinary: Negative.   Musculoskeletal: Negative.   Skin: Negative.   Neurological: Negative.   Endo/Heme/Allergies: Negative.   Psychiatric/Behavioral: Negative.    Objective:  Physical Exam: Filed Vitals:   08/28/12 0944  BP: 142/77  Pulse: 84  Temp: 98 F (36.7 C)  TempSrc: Oral  Resp: 20  Height:  5\' 3"  (1.6 m)  Weight: 224 lb 6.4 oz (101.787 kg)  SpO2: 96%  Physical Exam  Nursing note and vitals reviewed. Constitutional: She is oriented to person, place, and time. She appears well-developed and well-nourished. No distress.  HENT:  Head: Normocephalic and atraumatic.  Right Ear: External ear normal.  Left Ear: External ear normal.  Nose: Nose normal.  Mouth/Throat: Oropharynx is clear and moist. No oropharyngeal exudate.  Eyes: Pupils are equal, round, and reactive to light. Right eye exhibits no discharge. Left eye exhibits no discharge.  Cardiovascular: Normal rate, regular rhythm and normal heart sounds.   Pulmonary/Chest: Effort normal and breath sounds normal. No respiratory distress. She has no wheezes. She has no rales. She exhibits no tenderness.  Abdominal: Soft. Bowel sounds are normal.  Musculoskeletal: She exhibits no edema and no tenderness.  Neurological: She is alert and oriented to person, place, and time.  Skin: Skin is warm and dry. She is not diaphoretic.  Psychiatric: She has a normal mood and affect. Her behavior is normal. Judgment and thought content normal.   Assessment & Plan:  The assessment and plan has been discussed with Dr. Rogelia Boga, as detailed under each problem.  In brief, the she has improved in terms of community acquired pneumonia, and after completing a course of 7 days treatment with doxycycline. There is no need to extend her antibiotic therapy. She will followup with her PCP in 2 weeks for undiagnosed anemia.[name]

## 2012-08-29 NOTE — Assessment & Plan Note (Signed)
Review of her CBC reveals a low hemoglobin of 10.9. The patient denies any history of melena, however, she has not had a colonoscopy performed in the recent past. She describes her and fatigue, which has been exacerbated by cough. Given her current illness with community-acquired pneumonia, she will need for have an iron panel performed after the resolution of her acute illness since this can interfere with the results. Plan -Followup with PCP after 2 weeks. -Consider referral for colonoscopy at that time.

## 2012-08-29 NOTE — Assessment & Plan Note (Signed)
Noted chest x-ray findings revealing a right middle lobar infiltrates. She has improved since completing her doxycycline of 7 days. However, she still complains of cough is the main complaint. She reports some improvement with cough suppressants. She requests for prescription of more of the Robitussin, which she has been using with success. Vital signs in the clinic in stable without a high temperature and with a normal pulse rate and oxygen saturation of 96% on room air. The patient does not appear to be in respiratory distress. Chest exam is unremarkable. The weakness that she is describing could be having some contribution from chronic anemia, with hemoglobin of 10.9. This chronic anemia, has never been evaluated.  Plan. -Robitussin prescribed. -The patient has been advised to take her body temperature when she experiences hot flashes. - will need to have an iron panel and and screening colonoscopy. The iron studies should be timed after the resolution of her current acute illness.

## 2012-09-03 NOTE — Progress Notes (Signed)
I saw, examined, and discussed the patient with Dr K and agree with the note contained here.  

## 2012-09-09 ENCOUNTER — Telehealth: Payer: Self-pay | Admitting: *Deleted

## 2012-09-09 NOTE — Telephone Encounter (Signed)
Pt called - only working 1 1/2 days. Unable to get [employer] - needs help. Caring for mother at home.  Suggest to call Lynnae January and talk next appt with doctor about freq appt. Stanton Kidney Mustafa Potts RN 09/09/12/4:30PM

## 2012-09-14 ENCOUNTER — Other Ambulatory Visit: Payer: Self-pay | Admitting: *Deleted

## 2012-09-14 MED ORDER — FUROSEMIDE 20 MG PO TABS: 20.0000 mg | ORAL_TABLET | Freq: Every day | ORAL | Status: DC

## 2012-11-09 ENCOUNTER — Other Ambulatory Visit: Payer: Self-pay | Admitting: Internal Medicine

## 2012-11-20 ENCOUNTER — Other Ambulatory Visit: Payer: Self-pay | Admitting: Internal Medicine

## 2013-01-05 ENCOUNTER — Encounter: Payer: Self-pay | Admitting: Internal Medicine

## 2013-01-08 ENCOUNTER — Encounter: Payer: Self-pay | Admitting: Internal Medicine

## 2013-01-17 ENCOUNTER — Encounter (HOSPITAL_COMMUNITY): Payer: Self-pay | Admitting: Emergency Medicine

## 2013-01-17 ENCOUNTER — Emergency Department (INDEPENDENT_AMBULATORY_CARE_PROVIDER_SITE_OTHER)
Admission: EM | Admit: 2013-01-17 | Discharge: 2013-01-17 | Disposition: A | Discharge status: Home / Self Care | Payer: Self-pay | Source: Home / Self Care

## 2013-01-17 DIAGNOSIS — I10 Essential (primary) hypertension: Secondary | ICD-10-CM

## 2013-01-17 LAB — POCT I-STAT, CHEM 8
BUN: 13 mg/dL (ref 6–23)
Calcium, Ion: 1.18 mmol/L (ref 1.12–1.23)
Glucose, Bld: 92 mg/dL (ref 70–99)
TCO2: 33 mmol/L (ref 0–100)

## 2013-01-17 MED ORDER — LOSARTAN POTASSIUM 50 MG PO TABS: 50.0000 mg | ORAL_TABLET | Freq: Every day | ORAL | Status: DC

## 2013-01-17 NOTE — ED Notes (Signed)
Pt is c/o increase in BP yesterday with a slight headache and today.  Pt has a hx of hypertension. Pt states that she has taken BP meds today.  Denies any other symptoms. Last follow up visit with primary was in November.

## 2013-01-17 NOTE — ED Provider Notes (Signed)
Sherri Gill is a 62 y.o. female who presents to Urgent Care today for elevated blood pressure. Patient has a past medical history significant for diabetes and hypertension.  She's currently taking losartan 25 mg and Lasix 20mg  daily. She is in her normal state of health however at work had a blood pressure screening which was found to be elevated as high as 200/80. She is followed up at a pharmacy where her blood pressure was 170s over 70s. She notes a slight headache yesterday but none today. She feels well without chest pain palpitations dizziness blurry vision nausea vomiting or diarrhea. She is taking her medications regularly. She does not have a followup appointment with her primary care provider yet.    PMH reviewed. Hypertension and diabetes History  Substance Use Topics  . Smoking status: Never Smoker   . Smokeless tobacco: Not on file  . Alcohol Use: No   ROS as above Medications reviewed. No current facility-administered medications for this encounter.   Current Outpatient Prescriptions  Medication Sig Dispense Refill  . aspirin (ANACIN) 81 MG EC tablet Take 81 mg by mouth daily.        . furosemide (LASIX) 20 MG tablet TAKE ONE TABLET BY MOUTH EVERY DAY  30 tablet  0  . losartan (COZAAR) 25 MG tablet Take 1 tablet (25 mg total) by mouth daily.  30 tablet  1  . metFORMIN (GLUCOPHAGE) 1000 MG tablet Take 1 tablet (1,000 mg total) by mouth 2 (two) times daily with a meal.  30 tablet  11  . pravastatin (PRAVACHOL) 40 MG tablet Take 1 tablet (40 mg total) by mouth daily.  30 tablet  11  . [employer] Lancet Super Thin 30G MISC Use as directed to check blood sugar once a day       . benzonatate (TESSALON PERLES) 100 MG capsule Take 1 capsule (100 mg total) by mouth every 6 (six) hours as needed for cough.  30 capsule  1  . citalopram (CELEXA) 20 MG tablet Take 1 tablet (20 mg total) by mouth daily.  90 tablet  3  . Dextromethorphan-Guaifenesin (ROBITUSSIN DM) 10-100 MG/5ML liquid Take 5  mLs by mouth every 12 (twelve) hours.  240 mL  0  . losartan (COZAAR) 25 MG tablet TAKE ONE TABLET BY MOUTH EVERY DAY  90 tablet  3  . ofloxacin (OCUFLOX) 0.3 % ophthalmic solution 1-2 gtt every 2-4 hours x 2 days and then 1-2 gtts QID x 5 days.  5 mL  0    Exam:  BP 170/74  Pulse 77  Temp(Src) 98.5 F (36.9 C) (Oral)  SpO2 95% Gen: Well NAD HEENT: EOMI,  MMM Lungs: CTABL Nl WOB Heart: RRR no MRG Abd: NABS, NT, ND Exts: Non edematous BL  LE, warm and well perfused.  Neuro: Alert and oriented balance and coordination are intact  Results for orders placed during the hospital encounter of 01/17/13 (from the past 24 hour(s))  POCT I-STAT, CHEM 8     Status: None   Collection Time    01/17/13  1:26 PM      Result Value Range   Sodium 138  135 - 145 mEq/L   Potassium 3.8  3.5 - 5.1 mEq/L   Chloride 98  96 - 112 mEq/L   BUN 13  6 - 23 mg/dL   Creatinine, Ser 4.09  0.50 - 1.10 mg/dL   Glucose, Bld 92  70 - 99 mg/dL   Calcium, Ion 8.11  9.14 -  1.23 mmol/L   TCO2 33  0 - 100 mmol/L   Hemoglobin 12.6  12.0 - 15.0 g/dL   HCT 16.1  09.6 - 04.5 %   No results found.  Assessment and Plan: 62 y.o. female with elevated blood pressure. Asymptomatic. Patient is in minimal ARB. Plan to increase losartan to 50 mg and followup with primary care provider in a few weeks.  Discussed warning signs or symptoms. Please see discharge instructions. Patient expresses understanding.      Rodolph Bong, MD 01/17/13 1355

## 2013-01-19 NOTE — ED Provider Notes (Signed)
Medical screening examination/treatment/procedure(s) were performed by resident physician or non-physician practitioner and as supervising physician I was immediately available for consultation/collaboration.   Barkley Bruns MD.   Linna Hoff, MD 01/19/13 1026

## 2013-01-21 ENCOUNTER — Other Ambulatory Visit: Payer: Self-pay | Admitting: Internal Medicine

## 2013-01-22 NOTE — Telephone Encounter (Signed)
Indication for furosemide is unclear from recent progress notes and review of the problem list.  She is scheduled to follow-up with Dr. Earlene Plater in 3 days.  I will defer refilling this medication without an obvious indication until she is assessed by Dr. Earlene Plater.  If the furosemide is indicated it can be refilled at that time.

## 2013-01-25 ENCOUNTER — Encounter: Payer: Self-pay | Admitting: Internal Medicine

## 2013-01-25 ENCOUNTER — Ambulatory Visit (INDEPENDENT_AMBULATORY_CARE_PROVIDER_SITE_OTHER): Payer: Self-pay | Admitting: Internal Medicine

## 2013-01-25 VITALS — BP 150/83 | HR 77 | Temp 98.0°F | Ht 63.0 in | Wt 222.5 lb

## 2013-01-25 DIAGNOSIS — E1165 Type 2 diabetes mellitus with hyperglycemia: Secondary | ICD-10-CM

## 2013-01-25 DIAGNOSIS — I10 Essential (primary) hypertension: Secondary | ICD-10-CM

## 2013-01-25 DIAGNOSIS — E1149 Type 2 diabetes mellitus with other diabetic neurological complication: Secondary | ICD-10-CM

## 2013-01-25 DIAGNOSIS — E1142 Type 2 diabetes mellitus with diabetic polyneuropathy: Secondary | ICD-10-CM

## 2013-01-25 DIAGNOSIS — E785 Hyperlipidemia, unspecified: Secondary | ICD-10-CM

## 2013-01-25 LAB — POCT GLYCOSYLATED HEMOGLOBIN (HGB A1C): Hemoglobin A1C: 7.2

## 2013-01-25 MED ORDER — FUROSEMIDE 20 MG PO TABS: 20.0000 mg | ORAL_TABLET | Freq: Every day | ORAL | Status: DC

## 2013-01-25 MED ORDER — LOSARTAN POTASSIUM 100 MG PO TABS: 100.0000 mg | ORAL_TABLET | Freq: Every day | ORAL | Status: DC

## 2013-01-25 NOTE — Assessment & Plan Note (Signed)
BP Readings from Last 3 Encounters:  01/25/13 150/83  01/17/13 170/74  08/28/12 142/77    Lab Results  Component Value Date   NA 138 01/17/2013   K 3.8 01/17/2013   CREATININE 0.80 01/17/2013    Assessment:  Blood pressure control: moderately elevated  Progress toward BP goal:  improved   Plan:  Medications: Continue furosemide 20 mg daily, increase losartan to 100 mg daily  Educational resources provided: brochure;handout  Other plans: Patient is very enthusiastic about exercise and diet and has started walking 45 minutes a day. Our next step pharmacotherapy wise would be to switch furosemide to hydrochlorothiazide or add a beta blocker.

## 2013-01-25 NOTE — Progress Notes (Signed)
  Subjective:    Patient ID: Sherri Gill, female    DOB: 11-14-1950, 62 y.o.   MRN: [id number]  HPI: 62 year old woman with obesity, hypertension, and type 2 diabetes here for followup.  Hypertension A couple of weeks ago she checked her blood pressure at home using a friend's home blood pressure cuff and it was elevated at greater than 200/100. She went to the urgent care 1 week ago regarding this high blood pressure. They increased her losartan from 25-50 mg daily and recommended she see me. She has been having off and on headaches for the past few weeks and has had blurred vision for at least 3 years but no acute changes in vision. No tinnitus, no chest pain, no dyspnea, and no dizziness. Diabetes Patient does not check blood sugars at home. Patient had a hemoglobin A1c drawn at a work site health fair and it was found to be 8.2 several weeks ago. No polyuria or polydipsia. Patient does have blurred vision.    Review of Systems  Constitutional: Negative for fatigue.  HENT: Negative for hearing loss, ear pain and tinnitus.   Eyes: Positive for visual disturbance.  Respiratory: Negative for shortness of breath.   Cardiovascular: Negative for chest pain.  Endocrine: Negative for polydipsia and polyuria.  Neurological: Negative for dizziness, weakness and numbness.    Current Medications: 1. Daily aspirin 81 mg 2. Citalopram 20 mg daily 3. Furosemide 20 mg daily 4. Losartan 50 mg daily 5. Metformin 1000 mg twice a day 6. Pravastatin 40 mg daily      Objective:   Physical Exam:  GENERAL: obese; no acute distress HEAD: atraumatic, normocephalic EYES: pupils equal, round and reactive; sclera anicteric; normal conjunctiva EARS: external ears normal, canals ceruminous, TMs not visualized NOSE: normal nasal mucosa, no erythema or drainage MOUTH/THROAT: oropharynx clear, moist mucous membranes, pink gingiva, normal dentition NECK: supple, thyroid normal in size and without palpable  nodules LYMPH: no cervical or supraclavicular lymphadenopathy LUNGS: clear to auscultation bilaterally, normal work of breathing HEART: normal rate and regular rhythm; normal S1 and S2 without S3 or S4; no murmurs, rubs, or clicks PULSES: radial 2+ and symmetric ABDOMEN: soft, non-tender, normal bowel sounds CRANIAL NERVES: pupils reactive to light bilaterally; extra occular muscles are intact; smile is symmetric; uvula is midline and palate elevates symmetrically; tongue protrudes midline SKIN: warm, dry, intact, normal turgor, no rashes EXTREMITIES: no peripheral edema, clubbing, or cyanosis   Filed Vitals:   01/25/13 0906  BP: 150/83  Pulse: 77  Temp: 98 F (36.7 C)    BP Readings from Last 3 Encounters:  01/25/13 150/83  01/17/13 170/74  08/28/12 142/77    Recent Labs  01/25/13 0938  GLUCAP 122*     Lab Results  Component Value Date   HGBA1C 7.2 01/25/2013   HGBA1C 7.2 06/11/2012   HGBA1C 6.5 06/12/2011   Lab Results  Component Value Date   MICROALBUR 2.73* 11/18/2008   LDLCALC 114* 06/11/2012   CREATININE 0.80 01/17/2013     BMET Component Value Date/Time   NA 138 01/17/2013 1326   K 3.8 01/17/2013 1326   CL 98 01/17/2013 1326   CO2 31 06/11/2012 1608   GLUCOSE 92 01/17/2013 1326   BUN 13 01/17/2013 1326   CREATININE 0.80 01/17/2013 1326   CALCIUM 9.7 06/11/2012 1608          Assessment & Plan:

## 2013-01-25 NOTE — Assessment & Plan Note (Signed)
Lab Results  Component Value Date   HGBA1C 7.2 01/25/2013   HGBA1C 7.2 06/11/2012   HGBA1C 6.5 06/12/2011     Assessment:  Diabetes control: fair control  Progress toward A1C goal:  unchanged   Plan:  Medications:  continue current medications  Instruction/counseling given: reminded to bring medications to each visit, discussed the need for weight loss and discussed diet  Educational resources provided: brochure  Self management tools provided: copy of home glucose meter download;home glucose logbook  Other plans: Because the patient has begun to institute exercise and dietary changes to her lifestyle, I don't think we need to intervene with medicines this visit. We will recheck a hemoglobin A1c in 3 months and add second oral agent if we're not below 7 at that point.

## 2013-01-25 NOTE — Assessment & Plan Note (Signed)
Patient's 10 year CVD risk is greater than 7.5%. Her LDL is above the goal of < 100. She is currently on moderate intensity statin therapy with pravastatin 40 mg daily. I will not make any changes to this therapy at this visit. I think she would benefit from a higher dose of pravastatin or switch to atorvastatin, but I'm concerned about the cost of these changes. She will schedule a meeting with Rudell Cobb. Once we have a better sense of her resources, we will consider either increasing pravastatin or switching to atorvastatin.

## 2013-01-25 NOTE — Patient Instructions (Addendum)
General Instructions:  Increase LOSARTAN to 100 mg every day.  Take two (2) 50 mg tablets a day.  When you run out, fill prescription for 100 mg tablets and take (1) a day.    Treatment Goals:  Goals (1 Years of Data) as of 01/25/13         As of Today 01/17/13 01/17/13 08/28/12 08/18/12     Blood Pressure    . Blood Pressure < 130/80  150/83 170/74 160/100 142/77 171/87     Result Component    . HEMOGLOBIN A1C < 7.0  7.2        . LDL CALC < 100            Progress Toward Treatment Goals:  Treatment Goal 01/25/2013  Hemoglobin A1C unchanged  Blood pressure improved    Self Care Goals & Plans:  Self Care Goal 01/25/2013  Manage my medications take my medicines as prescribed; bring my medications to every visit  Eat healthy foods drink diet soda or water instead of juice or soda; eat more vegetables; eat foods that are low in salt; eat baked foods instead of fried foods  Be physically active take a walk every day       Care Management & Community Referrals:  Referral 01/25/2013  Referrals made for care management support none needed

## 2013-01-25 NOTE — Addendum Note (Signed)
Addended by: Gwynn Burly B on: 01/25/2013 12:23 PM   Modules accepted: Orders

## 2013-01-25 NOTE — Addendum Note (Signed)
Addended by: Gwynn Burly B on: 01/25/2013 12:13 PM   Modules accepted: Orders, Medications

## 2013-01-27 ENCOUNTER — Telehealth: Payer: Self-pay | Admitting: *Deleted

## 2013-01-27 NOTE — Telephone Encounter (Signed)
Pt states she continues to have h/a's, bp ranging from 174/90 to 180/90, has been taking new dosage for 2 days, have made f/u appt 5/23 and offered an appt this week in mid conversation her cell ph went dead, have called back and left vmail

## 2013-02-01 NOTE — Telephone Encounter (Signed)
Pt called back today and is concerned about her elevated BP.  Last seen in clinic on 3/31 and meds changed. She reports BP as 174/83 and wants to know if she needs to increase the meds as talked about at last visit or just wait until next scheduled visit on 5/23. Please call the patient at [phone].  If she does not answer you may leave a message, per pt.

## 2013-02-02 ENCOUNTER — Telehealth: Payer: Self-pay | Admitting: Internal Medicine

## 2013-02-02 DIAGNOSIS — I10 Essential (primary) hypertension: Secondary | ICD-10-CM

## 2013-02-05 MED ORDER — CHLORTHALIDONE 25 MG PO TABS: 12.5000 mg | ORAL_TABLET | Freq: Every day | ORAL | Status: DC

## 2013-02-05 NOTE — Telephone Encounter (Signed)
Spoke with pt re bp meds.  Told her to stop Lasix and start chlorthalidone 12.5mg  daily.  Continue losartan.  Asked pt to come for lab work in 2 weeks.

## 2013-02-13 ENCOUNTER — Other Ambulatory Visit: Payer: Self-pay | Admitting: Internal Medicine

## 2013-02-16 ENCOUNTER — Ambulatory Visit (INDEPENDENT_AMBULATORY_CARE_PROVIDER_SITE_OTHER): Payer: Self-pay | Admitting: Internal Medicine

## 2013-02-16 ENCOUNTER — Encounter: Payer: Self-pay | Admitting: *Deleted

## 2013-02-16 ENCOUNTER — Encounter: Payer: Self-pay | Admitting: Internal Medicine

## 2013-02-16 VITALS — BP 130/70 | HR 89 | Temp 99.8°F | Ht 64.0 in | Wt 219.0 lb

## 2013-02-16 DIAGNOSIS — J189 Pneumonia, unspecified organism: Secondary | ICD-10-CM

## 2013-02-16 DIAGNOSIS — E1142 Type 2 diabetes mellitus with diabetic polyneuropathy: Secondary | ICD-10-CM

## 2013-02-16 DIAGNOSIS — I10 Essential (primary) hypertension: Secondary | ICD-10-CM

## 2013-02-16 DIAGNOSIS — E1165 Type 2 diabetes mellitus with hyperglycemia: Secondary | ICD-10-CM

## 2013-02-16 DIAGNOSIS — E1149 Type 2 diabetes mellitus with other diabetic neurological complication: Secondary | ICD-10-CM

## 2013-02-16 LAB — BASIC METABOLIC PANEL
BUN: 13 mg/dL (ref 6–23)
CO2: 32 mEq/L (ref 19–32)
Glucose, Bld: 108 mg/dL — ABNORMAL HIGH (ref 70–99)
Potassium: 3.9 mEq/L (ref 3.5–5.3)

## 2013-02-16 MED ORDER — DOXYCYCLINE HYCLATE 100 MG PO TABS: 100.0000 mg | ORAL_TABLET | Freq: Two times a day (BID) | ORAL | Status: DC

## 2013-02-16 NOTE — Patient Instructions (Addendum)
General Instructions:  Please follow-up at the clinic in 3 weeks, at which time we will reevaluate your breathing, cough - OR, please follow-up in the clinic sooner if needed.  There have been changes in your medications:  START doxycycline for your pneumonia - take it for 7 days. Take with food if needed.    If you have been started on new medication(s), and you develop symptoms concerning for allergic reaction, including, but not limited to, throat closing, tongue swelling, rash, please stop the medication immediately and call the clinic at [phone], and go to the ER.  If you are diabetic, please bring your meter to your next visit.  If symptoms worsen, or new symptoms arise, please call the clinic or go to the ER.  PLEASE BRING ALL OF YOUR MEDICATIONS  IN A BAG TO YOUR NEXT APPOINTMENT   Treatment Goals:  Goals (1 Years of Data) as of 02/16/13         As of Today 01/25/13 01/17/13 01/17/13 08/28/12     Blood Pressure    . Blood Pressure < 130/80  130/70 150/83 170/74 160/100 142/77     Result Component    . HEMOGLOBIN A1C < 7.0   7.2       . LDL CALC < 100            Progress Toward Treatment Goals:  Treatment Goal 01/25/2013  Hemoglobin A1C unchanged  Blood pressure improved    Self Care Goals & Plans:  Self Care Goal 02/16/2013  Manage my medications take my medicines as prescribed; bring my medications to every visit; refill my medications on time  Monitor my health keep track of my blood glucose; bring my glucose meter and log to each visit  Eat healthy foods drink diet soda or water instead of juice or soda; eat more vegetables; eat foods that are low in salt; eat baked foods instead of fried foods  Be physically active find an activity I enjoy       Care Management & Community Referrals:  Referral 01/25/2013  Referrals made for care management support none needed      Pneumonia, Adult Pneumonia is an infection of the lungs.  CAUSES Pneumonia may be  caused by bacteria or a virus. Usually, these infections are caused by breathing infectious particles into the lungs (respiratory tract). SYMPTOMS   Cough.  Fever.  Chest pain.  Increased rate of breathing.  Wheezing.  Mucus production. DIAGNOSIS  If you have the common symptoms of pneumonia, your caregiver will typically confirm the diagnosis with a chest X-ray. The X-ray will show an abnormality in the lung (pulmonary infiltrate) if you have pneumonia. Other tests of your blood, urine, or sputum may be done to find the specific cause of your pneumonia. Your caregiver may also do tests (blood gases or pulse oximetry) to see how well your lungs are working. TREATMENT  Some forms of pneumonia may be spread to other people when you cough or sneeze. You may be asked to wear a mask before and during your exam. Pneumonia that is caused by bacteria is treated with antibiotic medicine. Pneumonia that is caused by the influenza virus may be treated with an antiviral medicine. Most other viral infections must run their course. These infections will not respond to antibiotics.  PREVENTION A pneumococcal shot (vaccine) is available to prevent a common bacterial cause of pneumonia. This is usually suggested for:  People over 110 years old.  Patients on chemotherapy.  People  with chronic lung problems, such as bronchitis or emphysema.  People with immune system problems. If you are over 65 or have a high risk condition, you may receive the pneumococcal vaccine if you have not received it before. In some countries, a routine influenza vaccine is also recommended. This vaccine can help prevent some cases of pneumonia.You may be offered the influenza vaccine as part of your care. If you smoke, it is time to quit. You may receive instructions on how to stop smoking. Your caregiver can provide medicines and counseling to help you quit. HOME CARE INSTRUCTIONS   Cough suppressants may be used if you are  losing too much rest. However, coughing protects you by clearing your lungs. You should avoid using cough suppressants if you can.  Your caregiver may have prescribed medicine if he or she thinks your pneumonia is caused by a bacteria or influenza. Finish your medicine even if you start to feel better.  Your caregiver may also prescribe an expectorant. This loosens the mucus to be coughed up.  Only take over-the-counter or prescription medicines for pain, discomfort, or fever as directed by your caregiver.  Do not smoke. Smoking is a common cause of bronchitis and can contribute to pneumonia. If you are a smoker and continue to smoke, your cough may last several weeks after your pneumonia has cleared.  A cold steam vaporizer or humidifier in your room or home may help loosen mucus.  Coughing is often worse at night. Sleeping in a semi-upright position in a recliner or using a couple pillows under your head will help with this.  Get rest as you feel it is needed. Your body will usually let you know when you need to rest. SEEK IMMEDIATE MEDICAL CARE IF:   Your illness becomes worse. This is especially true if you are elderly or weakened from any other disease.  You cannot control your cough with suppressants and are losing sleep.  You begin coughing up blood.  You develop pain which is getting worse or is uncontrolled with medicines.  You have a fever.  Any of the symptoms which initially brought you in for treatment are getting worse rather than better.  You develop shortness of breath or chest pain. MAKE SURE YOU:   Understand these instructions.  Will watch your condition.  Will get help right away if you are not doing well or get worse. Document Released: 10/14/2005 Document Revised: 01/06/2012 Document Reviewed: 01/03/2011 [facility] Patient Information 2013 [place], [place].

## 2013-02-16 NOTE — Assessment & Plan Note (Addendum)
Assessment: Symptoms and physical exam are most consistent with CAP. She is oxygenating well and vital signs are stable, therefore, I do not think she needs to be admitted today. However, she would benefit from antibiotic therapy. She does not have insurance at this point and no orange card coverage. The patient was informed that per guidelines, best treatment would be azithromycin - however, out of pocket cost of ~$48. Alternatively, doxycycline is $38. She was recommended Azithromycin, however, refused and was agreeable to doxycycline. She also was informed I would recommend albuterol inhaler for her wheezing, but it again would be out of pocket cost. She refused.   Plan:      Doxycycline 100mg  BID x 7 days. The patient was informed of the red flag symptoms that should prompt her immediate return for reevaluation - such as, but not limited to the following: worsening cough, high fevers, generalized malaise, worsening difficulty breathing or wheezing. She verbally expresses understanding of the information provided.    ADDENDUM TO PLAN: 02/17/2013, 2:35 PM  Assessment: Patient called and informed she is still having cough, feeling poorly and is pain with excessive coughing. She has allergy to codeine and has no insurance, therefore, very much limiting our options for therapy. She was advised to use tylenol, can use 650mg  dosage at a time, not to exceed 4g/day. As well, she wants to try mucinex for cough/ congestion.   Plan:  OTC Mucinex and Tylenol for cough/ pain/ fever.

## 2013-02-16 NOTE — Progress Notes (Signed)
   Patient: Sherri Gill   MRN: [id number]  DOB: October 25, 1951  PCP: Lollie Sails, MD   Subjective:    CC: Headache, Fever and Chills   HPI: Sherri Gill is a 62 y.o. female with a PMHx as outlined below, who presented to clinic today for the following:  1) Cough/ fevers - the patient describes a 3 day hx of progressively worsening nasal congestion, sore throat, swollen glands, cough described as hoarse and productive of yellow sputum, chills, subjective fevers (without checking her temp at home), myalgias, and hoarseness of throat. Additionally, mild wheezing overnight and decreased appetite. Pain under her ribs from coughing. She denies a history of nausea, vomiting, diarrhea, ear pain or discharge, eye pain or discharge, and denies a history of asthma.  Patient denies smoke cigarettes. No known sick contacts. Did get her flu shot this year. Taking Robitussin without relief.     Review of Systems: Per HPI.   Current Outpatient Medications: Medication Sig  . aspirin (ANACIN) 81 MG EC tablet Take 81 mg by mouth daily.    Jerrell Belfast Lancet Super Thin 30G MISC Use as directed to check blood sugar once a day   . chlorthalidone (HYGROTON) 25 MG tablet Take 0.5 tablets (12.5 mg total) by mouth daily.  . citalopram (CELEXA) 20 MG tablet Take 1 tablet (20 mg total) by mouth daily.  [name] losartan (COZAAR) 100 MG tablet Take 1 tablet (100 mg total) by mouth daily.  . metFORMIN (GLUCOPHAGE) 1000 MG tablet Take 1 tablet (1,000 mg total) by mouth 2 (two) times daily with a meal.  . pravastatin (PRAVACHOL) 40 MG tablet Take 1 tablet (40 mg total) by mouth daily.    Allergies  Allergen Reactions  . Codeine Sulfate     REACTION: unspecified  . Lisinopril     REACTION: Cough  . Penicillins     Past Medical History  Diagnosis Date  . Depression   . Chronic anemia 08/29/2012    Need colonoscopy or report. Need iron panel.   . Dyslipidemia 06/30/2007  . Essential hypertension, benign  06/30/2007  . Type 2 diabetes mellitus, uncontrolled, with neuropathy 06/30/2007  . GERD 06/30/2007  . Insomnia 06/11/2012  . Peripheral neuropathy 12/28/2009  . Obesity, BMI 35-40 06/11/2012    Objective:    Physical Exam: Filed Vitals:   02/16/13 1059  BP: 130/70  Pulse: 89  Temp: 99.8 F (37.7 C)     General: Vital signs reviewed and noted. Well-developed, well-nourished, in no acute distress; alert, appropriate and cooperative throughout examination.  Head: Normocephalic, atraumatic.  Eyes: conjunctivae/corneas clear. PERRL, EOM's intact.   Ears: TM nonerythematous, not bulging, good light reflex bilaterally.  Nose: Mucous membranes moist, inflamed with light yellow rhinorrhea, erythematous.  Throat: Oropharynx mildly erythematous with tonsillar enlargement and without exudate or tonsillar erythema.    Neck: No deformities, masses, or tenderness noted.  Lungs:  Normal respiratory effort. Basilar crackles, worst of left that were not improved after cough. Occasional end inspiratory and end expiratory wheezing.  Heart: RRR. S1 and S2 normal without gallop, rubs. (+) murmur.  Abdomen:  BS normoactive. Soft, Nondistended, non-tender.  No masses or organomegaly.  Extremities: No pretibial edema.    Assessment/ Plan:   The patient's case and plan of care was discussed with attending physician, Dr. Lars Mage.

## 2013-02-17 MED ORDER — GUAIFENESIN ER 600 MG PO TB12: 1200.0000 mg | ORAL_TABLET | Freq: Two times a day (BID) | ORAL | Status: DC

## 2013-02-17 NOTE — Addendum Note (Signed)
Addended by: Priscella Mann on: 02/17/2013 02:37 PM   Modules accepted: Orders

## 2013-02-19 ENCOUNTER — Other Ambulatory Visit: Payer: Self-pay

## 2013-02-19 ENCOUNTER — Encounter: Payer: Self-pay | Admitting: Internal Medicine

## 2013-02-19 NOTE — Progress Notes (Signed)
Case discussed with Dr. Kalia-Reynolds at the time of the visit, immediately after the resident saw the patient.  I reviewed the resident's history and exam and pertinent patient test results.  I agree with the assessment, diagnosis and plan of care documented in the resident's note.   

## 2013-02-25 ENCOUNTER — Encounter: Payer: Self-pay | Admitting: Internal Medicine

## 2013-02-25 ENCOUNTER — Ambulatory Visit (INDEPENDENT_AMBULATORY_CARE_PROVIDER_SITE_OTHER): Payer: Self-pay | Admitting: Internal Medicine

## 2013-02-25 VITALS — BP 135/69 | HR 77 | Temp 98.3°F | Ht 63.5 in | Wt 216.6 lb

## 2013-02-25 DIAGNOSIS — J189 Pneumonia, unspecified organism: Secondary | ICD-10-CM

## 2013-02-25 LAB — GLUCOSE, CAPILLARY: Glucose-Capillary: 66 mg/dL — ABNORMAL LOW (ref 70–99)

## 2013-02-25 MED ORDER — HYDROCODONE-HOMATROPINE 5-1.5 MG/5ML PO SYRP: 5.0000 mL | ORAL_SOLUTION | Freq: Four times a day (QID) | ORAL | Status: DC | PRN

## 2013-02-25 NOTE — Assessment & Plan Note (Addendum)
Prolonged mild community acquired pneumonia. Appropriately treated with doxycycline a seven-day course during last clinic visit. Patient completed the course. She does have residual symptoms which are getting better. She does not want to have a chest x-ray evaluation which is not mandated at this point in time anyways.  Advised her to continue symptomatic management. Gave her prescription for Hycodan syrup- which are with 31 dollars at [facility]. If she was to take it she will otherwise she'll continue Mucinex. If symptoms get worse or does not get better she'll call for early appointment or go to the ED.   - this is her second episode of pneumonia in past 6 months which is somewhat concerning although does not fit typical post obstructive pneumonia picture it up. She smoked a few cigarettes when she was in 93s but does not have extensive history of smoking. Also does not have asthma or COPD.  She does have risk factors for cardiac disease and if heart symptoms continue she might need further workup for pneumonia and cardiac disease.

## 2013-02-25 NOTE — Patient Instructions (Signed)
Please continue taking cough suppressant medication for symptom relief. Take Hycodan syrup as needed or Mucinex twice daily.  You do not need any more antibiotics at this point in time. If your symptoms did not get better or starts getting worse, please call us or go to the ED. You might need to get a chest x-ray and further workup at that point.

## 2013-02-25 NOTE — Progress Notes (Signed)
Case discussed with Dr. Patel soon after the resident saw the patient. We reviewed the resident's history and exam and pertinent patient test results. I agree with the assessment, diagnosis, and plan of care documented in the resident's note. 

## 2013-02-25 NOTE — Progress Notes (Signed)
  Subjective:    Patient ID: Sherri Gill, female    DOB: [birth year], 62 y.o.   MRN: [id number]  HPIpatient is a pleasant 62 year woman with type 2 diabetes,peripheral neuropathy, hypertension, obesity who was seen in our clinic on 02/16/2013 for community acquired pneumonia, for followup visit.  She reports minimal improvement in symptoms but not back to baseline. She was treated with doxycycline 100 mg twice daily for total 7 days which she completed. She reports improvement in cough, but still has thick yellow sputum production. She denies any wheezing. She does have shortness of breath with coughing but not with ambulation or at rest. She has subjective fever- last episode on Monday evening. No more feverish spells last 2 days. She still has weakness and body which is although better than before.  She does report having nausea- although she was started on doxycycline before she started having nausea.  She denies any chest pain, headache, palpitations, abdominal pain.   Review of Systems    as per history of present illness. Objective:   Physical Exam  General: NAD HEENT: PERRL, EOMI, no scleral icterus Cardiac: RRR, no rubs, murmurs or gallops Pulm: mild inspiratory wheezes bilaterally. No crackles. Abd: soft, nontender, nondistended, BS present Ext: warm and well perfused, no pedal edema Neuro: alert and oriented X3, cranial nerves II-XII grossly intact  up      Assessment & Plan:

## 2013-03-19 ENCOUNTER — Encounter: Payer: Self-pay | Admitting: Internal Medicine

## 2013-03-25 ENCOUNTER — Other Ambulatory Visit: Payer: Self-pay | Admitting: Internal Medicine

## 2013-05-06 ENCOUNTER — Other Ambulatory Visit: Payer: Self-pay

## 2013-08-16 ENCOUNTER — Other Ambulatory Visit: Payer: Self-pay | Admitting: Internal Medicine

## 2013-08-16 DIAGNOSIS — E785 Hyperlipidemia, unspecified: Secondary | ICD-10-CM

## 2013-08-16 DIAGNOSIS — F329 Major depressive disorder, single episode, unspecified: Secondary | ICD-10-CM

## 2013-08-19 MED ORDER — CITALOPRAM HYDROBROMIDE 20 MG PO TABS: 20.0000 mg | ORAL_TABLET | Freq: Every day | ORAL | Status: DC

## 2013-10-12 ENCOUNTER — Encounter: Payer: Self-pay | Admitting: Internal Medicine

## 2013-10-12 ENCOUNTER — Ambulatory Visit (INDEPENDENT_AMBULATORY_CARE_PROVIDER_SITE_OTHER): Payer: Self-pay | Admitting: Internal Medicine

## 2013-10-12 VITALS — BP 143/74 | HR 79 | Temp 97.6°F | Ht 65.0 in | Wt 215.6 lb

## 2013-10-12 DIAGNOSIS — E119 Type 2 diabetes mellitus without complications: Secondary | ICD-10-CM

## 2013-10-12 DIAGNOSIS — Z23 Encounter for immunization: Secondary | ICD-10-CM

## 2013-10-12 DIAGNOSIS — IMO0002 Reserved for concepts with insufficient information to code with codable children: Secondary | ICD-10-CM

## 2013-10-12 DIAGNOSIS — Z0000 Encounter for general adult medical examination without abnormal findings: Secondary | ICD-10-CM

## 2013-10-12 DIAGNOSIS — E66812 Obesity, class 2: Secondary | ICD-10-CM

## 2013-10-12 DIAGNOSIS — E1140 Type 2 diabetes mellitus with diabetic neuropathy, unspecified: Secondary | ICD-10-CM

## 2013-10-12 DIAGNOSIS — E1142 Type 2 diabetes mellitus with diabetic polyneuropathy: Secondary | ICD-10-CM

## 2013-10-12 DIAGNOSIS — K219 Gastro-esophageal reflux disease without esophagitis: Secondary | ICD-10-CM

## 2013-10-12 DIAGNOSIS — E669 Obesity, unspecified: Secondary | ICD-10-CM

## 2013-10-12 DIAGNOSIS — G4700 Insomnia, unspecified: Secondary | ICD-10-CM

## 2013-10-12 DIAGNOSIS — I10 Essential (primary) hypertension: Secondary | ICD-10-CM

## 2013-10-12 DIAGNOSIS — E1149 Type 2 diabetes mellitus with other diabetic neurological complication: Secondary | ICD-10-CM

## 2013-10-12 MED ORDER — METFORMIN HCL 500 MG PO TABS: 500.0000 mg | ORAL_TABLET | Freq: Two times a day (BID) | ORAL | Status: DC

## 2013-10-12 MED ORDER — RANITIDINE HCL 75 MG PO TABS: 75.0000 mg | ORAL_TABLET | Freq: Two times a day (BID) | ORAL | Status: DC

## 2013-10-12 NOTE — Progress Notes (Signed)
   Subjective:    Patient ID: [name], female    DOB: August 28, 1951, 62 y.o.   MRN: [id number]  HPI Comments: Ms. Fomby is a 62 year old woman with a PMH of DM type 2, HTN, GERD and insomnia presenting for routine exam and 4 day history of abdominal pain that has resolved today.  The pain came on gradually 3 nights ago.  It was located in the epigastric area and RUQ and occurred intermittently.  Associated symptoms include belching and a sour taste in her mouth.  She denies CP/SOB, fatigue, decreased po, N/V, heartburn, diarrhea or constipation.  She has been able to go about her usual routine.  She took a teaspoon of apple cider vinegar with warm water yesterday and feels this relieved the pain.     Review of Systems  Constitutional: Negative for fever, chills, appetite change and fatigue.  Respiratory: Negative for shortness of breath.   Cardiovascular: Negative for chest pain.  Gastrointestinal: Negative for nausea, vomiting, diarrhea and constipation.  Genitourinary: Negative for dysuria.  Neurological: Negative for weakness.       Objective:   Physical Exam  Vitals reviewed. Constitutional: She is oriented to person, place, and time. She appears well-developed. No distress.  HENT:  Mouth/Throat: Oropharynx is clear and moist. No oropharyngeal exudate.  Eyes: EOM are normal. Pupils are equal, round, and reactive to light. Right eye exhibits no discharge. Left eye exhibits no discharge. No scleral icterus.  Neck: Normal range of motion. Neck supple.  Cardiovascular: Normal rate, regular rhythm and normal heart sounds.   Pulmonary/Chest: Effort normal and breath sounds normal. No respiratory distress. She has no wheezes. She has no rales.  Abdominal: Soft. Bowel sounds are normal. She exhibits no distension. There is no tenderness. There is no rebound and no guarding.  Musculoskeletal: Normal range of motion. She exhibits no edema and no tenderness.  Lymphadenopathy:    She has  no cervical adenopathy.  Neurological: She is alert and oriented to person, place, and time. No cranial nerve deficit. She exhibits normal muscle tone.  Skin: Skin is warm. She is not diaphoretic.  Psychiatric: She has a normal mood and affect. Her behavior is normal.          Assessment & Plan:  Please see problem based assessment and plan.

## 2013-10-12 NOTE — Assessment & Plan Note (Signed)
Lab Results  Component Value Date   HGBA1C 6.9 10/12/2013   HGBA1C 7.2 01/25/2013   HGBA1C 7.2 06/11/2012     Assessment: Diabetes control: good control (HgbA1C at goal) Progress toward A1C goal:  at goal Comments: non-fasting CBG is 75 in office.  She may not need as much metformin as she has been eating better and has lost ~ 10 pounds in the past year.  Plan: Medications:  Will decrease metformin from 1000mg  BID to 500mg  BID and reassess in four weeks Home glucose monitoring: Frequency: once a day Timing: before breakfast Instruction/counseling given: discussed foot care; patient declines foot exam today

## 2013-10-12 NOTE — Assessment & Plan Note (Addendum)
Assessment:  Patient with epigastric discomfort, belching and sour taste in mouth.  She says she has never been prescribed an H2RA or PPI.  No concern for cardiac etiology given gradual onset of epigastric pain 3 days ago that has resolved.  No CP/SOB, fatigue, N/V.    Plan:   1) Ranitidine 75mg  BID - it is on the [facility] $4 list, unlike the PPIs  2) Re-evaluate in 4 weeks and consider increased dose or switch to PPI if symptoms persist

## 2013-10-12 NOTE — Assessment & Plan Note (Addendum)
Patient is overdue for many health maintenance exams and is uninsured.  She agrees to return in 1 month for PAP smear.  Will refer for mammography today as she may qualify for scholarship.  Will address other screening needs at next visit.

## 2013-10-12 NOTE — Patient Instructions (Addendum)
1. Please return to clinic in 1 month for PAP smear and follow-up.   2. Please take all medications as prescribed.    3. If you have worsening of your symptoms or new symptoms arise, please call the clinic ([phone]), or go to the ER immediately if symptoms are severe.   Osteoarthritis Osteoarthritis is the most common form of arthritis. It is redness, soreness, and swelling (inflammation) affecting the cartilage. Cartilage acts as a cushion, covering the ends of bones where they meet to form a joint. CAUSES  Over time, the cartilage begins to wear away. This causes bone to rub on bone. This produces pain and stiffness in the affected joints. Factors that contribute to this problem are:  Excessive body weight.  Age.  Overuse of joints. SYMPTOMS   People with osteoarthritis usually experience joint pain, swelling, or stiffness.  Over time, the joint may lose its normal shape.  Small deposits of bone (osteophytes) may grow on the edges of the joint.  Bits of bone or cartilage can break off and float inside the joint space. This may cause more pain and damage.  Osteoarthritis can lead to depression, anxiety, feelings of helplessness, and limitations on daily activities. The most commonly affected joints are in the:  Ends of the fingers.  Thumbs.  Neck.  Lower back.  Knees.  Hips. DIAGNOSIS  Diagnosis is mostly based on your symptoms and exam. Tests may be helpful, including:  X-rays of the affected joint.  A computerized magnetic scan (MRI).  Blood tests to rule out other types of arthritis.  Joint fluid tests. This involves using a needle to draw fluid from the joint and examining the fluid under a microscope. TREATMENT  Goals of treatment are to control pain, improve joint function, maintain a normal body weight, and maintain a healthy lifestyle. Treatment approaches may include:  A prescribed exercise program with rest and joint relief.  Weight control with  nutritional education.  Pain relief techniques such as:  Properly applied heat and cold.  Electric pulses delivered to nerve endings under the skin (transcutaneous electrical nerve stimulation, TENS).  Massage.  Certain supplements. Ask your caregiver before using any supplements, especially in combination with prescribed drugs.  Medicines to control pain, such as:  Acetaminophen.  Nonsteroidal anti-inflammatory drugs (NSAIDs), such as naproxen.  Narcotic or central-acting agents, such as tramadol. This drug carries a risk of addiction and is generally prescribed for short-term use.  Corticosteroids. These can be given orally or as injection. This is a short-term treatment, not recommended for routine use.  Surgery to reposition the bones and relieve pain (osteotomy) or to remove loose pieces of bone and cartilage. Joint replacement may be needed in advanced states of osteoarthritis. HOME CARE INSTRUCTIONS  Your caregiver can recommend specific types of exercise. These may include:  Strengthening exercises. These are done to strengthen the muscles that support joints affected by arthritis. They can be performed with weights or with exercise bands to add resistance.  Aerobic activities. These are exercises, such as brisk walking or low-impact aerobics, that get your heart pumping. They can help keep your lungs and circulatory system in shape.  Range-of-motion activities. These keep your joints limber.  Balance and agility exercises. These help you maintain daily living skills. Learning about your condition and being actively involved in your care will help improve the course of your osteoarthritis. SEEK MEDICAL CARE IF:   You feel hot or your skin turns red.  You develop a rash in addition  to your joint pain.  You have an oral temperature above 102 F (38.9 C). FOR MORE INFORMATION  National Institute of Arthritis and Musculoskeletal and Skin Diseases:  www.niams.http://www.myers.net/ [employer] on Aging: https://walker.com/ American College of Rheumatology: www.rheumatology.org Document Released: 10/14/2005 Document Revised: 01/06/2012 Document Reviewed: 01/25/2010 [facility] Patient Information 2014 [place], [place]. Hip Exercises RANGE OF MOTION (ROM) AND STRETCHING EXERCISES  These exercises may help you when beginning to rehabilitate your injury. Doing them too aggressively can worsen your condition. Complete them slowly and gently. Your symptoms may resolve with or without further involvement from your physician, physical therapist or athletic trainer. While completing these exercises, remember:   Restoring tissue flexibility helps normal motion to return to the joints. This allows healthier, less painful movement and activity.  An effective stretch should be held for at least 30 seconds.  A stretch should never be painful. You should only feel a gentle lengthening or release in the stretched tissue. If these stretches worsen your symptoms even when done gently, consult your physician, physical therapist or athletic trainer. STRETCH Hamstrings, Supine   Lie on your back. Loop a belt or towel over the ball of your right / left foot.  Straighten your right / left knee and slowly pull on the belt to raise your leg. Do not allow the right / left knee to bend. Keep your opposite leg flat on the floor.  Raise the leg until you feel a gentle stretch behind your right / left knee or thigh. Hold this position for __________ seconds. Repeat __________ times. Complete this stretch __________ times per day.  STRETCH - Hip Rotators   Lie on your back on a firm surface. Grasp your right / left knee with your right / left hand and your ankle with your opposite hand.  Keeping your hips and shoulders firmly planted, gently pull your right / left knee and rotate your lower leg toward your opposite shoulder until you feel a stretch in your buttocks.  Hold this  stretch for __________ seconds. Repeat this stretch __________ times. Complete this stretch __________ times per day. STRETCH - Hamstrings/Adductors, V-Sit   Sit on the floor with your legs extended in a large "V," keeping your knees straight.  With your head and chest upright, bend at your waist reaching for your right foot to stretch your left adductors.  You should feel a stretch in your left inner thigh. Hold for __________ seconds.  Return to the upright position to relax your leg muscles.  Continuing to keep your chest upright, bend straight forward at your waist to stretch your hamstrings.  You should feel a stretch behind both of your thighs and/or knees. Hold for __________ seconds.  Return to the upright position to relax your leg muscles.  Repeat steps 2 through 4 for opposite leg. Repeat __________ times. Complete this exercise __________ times per day.  STRETCHING - Hip Flexors, Lunge  Half kneel with your right / left knee on the floor and your opposite knee bent and directly over your ankle.  Keep good posture with your head over your shoulders. Tighten your buttocks to point your tailbone downward; this will prevent your back from arching too much.  You should feel a gentle stretch in the front of your thigh and/or hip. If you do not feel any resistance, slightly slide your opposite foot forward and then slowly lunge forward so your knee once again lines up over your ankle. Be sure your tailbone remains pointed downward.  Hold  this stretch for __________ seconds. Repeat __________ times. Complete this stretch __________ times per day. STRENGTHENING EXERCISES These exercises may help you when beginning to rehabilitate your injury. They may resolve your symptoms with or without further involvement from your physician, physical therapist or athletic trainer. While completing these exercises, remember:   Muscles can gain both the endurance and the strength needed for  everyday activities through controlled exercises.  Complete these exercises as instructed by your physician, physical therapist or athletic trainer. Progress the resistance and repetitions only as guided.  You may experience muscle soreness or fatigue, but the pain or discomfort you are trying to eliminate should never worsen during these exercises. If this pain does worsen, stop and make certain you are following the directions exactly. If the pain is still present after adjustments, discontinue the exercise until you can discuss the trouble with your clinician. STRENGTH - Hip Extensors, Bridge   Lie on your back on a firm surface. Bend your knees and place your feet flat on the floor.  Tighten your buttocks muscles and lift your bottom off the floor until your trunk is level with your thighs. You should feel the muscles in your buttocks and back of your thighs working. If you do not feel these muscles, slide your feet 1-2 inches further away from your buttocks.  Hold this position for __________ seconds.  Slowly lower your hips to the starting position and allow your buttock muscles relax completely before beginning the next repetition.  If this exercise is too easy, you may cross your arms over your chest. Repeat __________ times. Complete this exercise __________ times per day.  STRENGTH - Hip Abductors, Straight Leg Raises  Be aware of your form throughout the entire exercise so that you exercise the correct muscles. Sloppy form means that you are not strengthening the correct muscles.  Lie on your side so that your head, shoulders, knee and hip line up. You may bend your lower knee to help maintain your balance. Your right / left leg should be on top.  Roll your hips slightly forward, so that your hips are stacked directly over each other and your right / left knee is facing forward.  Lift your top leg up 4-6 inches, leading with your heel. Be sure that your foot does not drift forward  or that your knee does not roll toward the ceiling.  Hold this position for __________ seconds. You should feel the muscles in your outer hip lifting (you may not notice this until your leg begins to tire).  Slowly lower your leg to the starting position. Allow the muscles to fully relax before beginning the next repetition. Repeat __________ times. Complete this exercise __________ times per day.  STRENGTH - Hip Adductors, Straight Leg Raises   Lie on your side so that your head, shoulders, knee and hip line up. You may place your upper foot in front to help maintain your balance. Your right / left leg should be on the bottom.  Roll your hips slightly forward, so that your hips are stacked directly over each other and your right / left knee is facing forward.  Tense the muscles in your inner thigh and lift your bottom leg 4-6 inches. Hold this position for __________ seconds.  Slowly lower your leg to the starting position. Allow the muscles to fully relax before beginning the next repetition. Repeat __________ times. Complete this exercise __________ times per day.  STRENGTH - Quadriceps, Straight Leg Raises  Quality counts!  Watch for signs that the quadriceps muscle is working to insure you are strengthening the correct muscles and not "cheating" by substituting with healthier muscles.  Lay on your back with your right / left leg extended and your opposite knee bent.  Tense the muscles in the front of your right / left thigh. You should see either your knee cap slide up or increased dimpling just above the knee. Your thigh may even quiver.  Tighten these muscles even more and raise your leg 4 to 6 inches off the floor. Hold for right / left seconds.  Keeping these muscles tense, lower your leg.  Relax the muscles slowly and completely in between each repetition. Repeat __________ times. Complete this exercise __________ times per day.  STRENGTH - Hip Abductors, Standing  Tie one end  of a rubber exercise band/tubing to a secure surface (table, pole) and tie a loop at the other end.  Place the loop around your right / left ankle. Keeping your ankle with the band directly opposite of the secured end, step away until there is tension in the tube/band.  Hold onto a chair as needed for balance.  Keeping your back upright, your shoulders over your hips, and your toes pointing forward, lift your right / left leg out to your side. Be sure to lift your leg with your hip muscles. Do not "throw" your leg or tip your body to lift your leg.  Slowly and with control, return to the starting position. Repeat exercise __________ times. Complete this exercise __________ times per day.  STRENGTH  Quadriceps, Squats  Stand in a door frame so that your feet and knees are in line with the frame.  Use your hands for balance, not support, on the frame.  Slowly lower your weight, bending at the hips and knees. Keep your lower legs upright so that they are parallel with the door frame. Squat only within the range that does not increase your knee pain. Never let your hips drop below your knees.  Slowly return upright, pushing with your legs, not pulling with your hands. Document Released: 11/01/2005 Document Revised: 01/06/2012 Document Reviewed: 01/26/2009 [facility] Patient Information 2014 [place], [place].

## 2013-10-13 NOTE — Assessment & Plan Note (Addendum)
Patient has implemented lifestyle changes and is down ~ 10 pounds in the past year.  She plans to continue to lose additional weight using healthy diet and exercise.  Her BMI is 36.

## 2013-10-13 NOTE — Assessment & Plan Note (Signed)
BP Readings from Last 3 Encounters:  10/12/13 143/74  02/25/13 135/69  02/16/13 130/70    Lab Results  Component Value Date   NA 136 02/16/2013   K 3.9 02/16/2013   CREATININE 0.75 02/16/2013    Assessment: Blood pressure control: controlled Progress toward BP goal:  at goal  Plan: Medications:  Continue losartan 100mg  daily and chlorthalidone 12.5mg  daily

## 2013-10-13 NOTE — Assessment & Plan Note (Addendum)
She stopped taking melatonin because she felt it was not working.  She has taken Ambien in the past but stopped due to fears about ADRs.  I discussed the importance of establishing a sleep routine.  She agrees to work on this.   Will plan to re-evaluate in 4 weeks and can consider addition of medication if no success with improved sleep hygiene.

## 2013-10-14 NOTE — Progress Notes (Signed)
I saw and evaluated the patient.  I personally confirmed the key portions of the history and exam documented by Dr. Wilson and I reviewed pertinent patient test results.  The assessment, diagnosis, and plan were formulated together and I agree with the documentation in the resident's note. 

## 2013-10-28 DIAGNOSIS — E230 Hypopituitarism: Secondary | ICD-10-CM

## 2013-10-28 DIAGNOSIS — B003 Herpesviral meningitis: Secondary | ICD-10-CM

## 2013-10-28 HISTORY — DX: Hypopituitarism: E23.0

## 2013-10-28 HISTORY — DX: Herpesviral meningitis: B00.3

## 2013-12-07 ENCOUNTER — Ambulatory Visit: Payer: Self-pay

## 2013-12-09 ENCOUNTER — Ambulatory Visit: Payer: Self-pay

## 2013-12-18 ENCOUNTER — Other Ambulatory Visit: Payer: Self-pay | Admitting: Internal Medicine

## 2013-12-31 ENCOUNTER — Telehealth: Payer: Self-pay | Admitting: Dietician

## 2013-12-31 MED ORDER — METFORMIN HCL 1000 MG PO TABS: ORAL_TABLET | ORAL | Status: DC

## 2013-12-31 NOTE — Telephone Encounter (Signed)
Patient called upset about recent medicine decrease- she wants to increase her metformin back to 1000 mg with her doctor's permission  Because she feel bad and her blood sugars are much higher despite no change in her diet.  Today her fasting blood sugar was  196. She feels sleepy, trying to not eat bread, low sugar oatmeal, does not drink soda, mostly water and coffee once a day and still has no energy Other fasting blood sugar since medicine change/decrease: 191 @ 402 Pm, 186 @ 122 PM,  221@ 611 PM, 227 @ 7 PM, 193,227 ,188, 226, 207,  317 @ 211 am.   Her blood sugar used to be 96, 100 when on 1000 mg metformin twice a day  Patient will be waiting for your input/approval.

## 2013-12-31 NOTE — Telephone Encounter (Signed)
I will change back to 1000mg  bid.  Her weight fluctuates and metformin does not cause hypoglycemia anyway.

## 2014-01-25 ENCOUNTER — Encounter (HOSPITAL_COMMUNITY): Payer: Self-pay | Admitting: Emergency Medicine

## 2014-01-25 ENCOUNTER — Emergency Department (HOSPITAL_COMMUNITY)
Admission: EM | Admit: 2014-01-25 | Discharge: 2014-01-25 | Disposition: A | Discharge status: Home / Self Care | Payer: Self-pay | Attending: Emergency Medicine | Admitting: Emergency Medicine

## 2014-01-25 ENCOUNTER — Emergency Department (HOSPITAL_COMMUNITY): Payer: Self-pay

## 2014-01-25 DIAGNOSIS — Z7982 Long term (current) use of aspirin: Secondary | ICD-10-CM | POA: Insufficient documentation

## 2014-01-25 DIAGNOSIS — K219 Gastro-esophageal reflux disease without esophagitis: Secondary | ICD-10-CM | POA: Insufficient documentation

## 2014-01-25 DIAGNOSIS — Y9301 Activity, walking, marching and hiking: Secondary | ICD-10-CM | POA: Insufficient documentation

## 2014-01-25 DIAGNOSIS — Z87891 Personal history of nicotine dependence: Secondary | ICD-10-CM | POA: Insufficient documentation

## 2014-01-25 DIAGNOSIS — E669 Obesity, unspecified: Secondary | ICD-10-CM | POA: Insufficient documentation

## 2014-01-25 DIAGNOSIS — Y9289 Other specified places as the place of occurrence of the external cause: Secondary | ICD-10-CM | POA: Insufficient documentation

## 2014-01-25 DIAGNOSIS — S79919A Unspecified injury of unspecified hip, initial encounter: Secondary | ICD-10-CM | POA: Insufficient documentation

## 2014-01-25 DIAGNOSIS — F329 Major depressive disorder, single episode, unspecified: Secondary | ICD-10-CM | POA: Insufficient documentation

## 2014-01-25 DIAGNOSIS — Z79899 Other long term (current) drug therapy: Secondary | ICD-10-CM | POA: Insufficient documentation

## 2014-01-25 DIAGNOSIS — E1142 Type 2 diabetes mellitus with diabetic polyneuropathy: Secondary | ICD-10-CM | POA: Insufficient documentation

## 2014-01-25 DIAGNOSIS — G4700 Insomnia, unspecified: Secondary | ICD-10-CM | POA: Insufficient documentation

## 2014-01-25 DIAGNOSIS — Z880 Allergy status to penicillin: Secondary | ICD-10-CM | POA: Insufficient documentation

## 2014-01-25 DIAGNOSIS — I10 Essential (primary) hypertension: Secondary | ICD-10-CM | POA: Insufficient documentation

## 2014-01-25 DIAGNOSIS — E1149 Type 2 diabetes mellitus with other diabetic neurological complication: Secondary | ICD-10-CM | POA: Insufficient documentation

## 2014-01-25 DIAGNOSIS — Z862 Personal history of diseases of the blood and blood-forming organs and certain disorders involving the immune mechanism: Secondary | ICD-10-CM | POA: Insufficient documentation

## 2014-01-25 DIAGNOSIS — S79929A Unspecified injury of unspecified thigh, initial encounter: Principal | ICD-10-CM

## 2014-01-25 DIAGNOSIS — E785 Hyperlipidemia, unspecified: Secondary | ICD-10-CM | POA: Insufficient documentation

## 2014-01-25 DIAGNOSIS — F3289 Other specified depressive episodes: Secondary | ICD-10-CM | POA: Insufficient documentation

## 2014-01-25 LAB — URINALYSIS, ROUTINE W REFLEX MICROSCOPIC
BILIRUBIN URINE: NEGATIVE
Glucose, UA: NEGATIVE mg/dL
Hgb urine dipstick: NEGATIVE
KETONES UR: NEGATIVE mg/dL
Leukocytes, UA: NEGATIVE
NITRITE: NEGATIVE
Protein, ur: NEGATIVE mg/dL
Specific Gravity, Urine: 1.021 (ref 1.005–1.030)
UROBILINOGEN UA: 1 mg/dL (ref 0.0–1.0)
pH: 7.5 (ref 5.0–8.0)

## 2014-01-25 MED ORDER — ACETAMINOPHEN 500 MG PO TABS
1000.0000 mg | ORAL_TABLET | Freq: Once | ORAL | Status: AC
  Administered 2014-01-25: 1000 mg via ORAL
  Filled 2014-01-25: qty 2

## 2014-01-25 MED ORDER — ACETAMINOPHEN 500 MG PO TABS
500.0000 mg | ORAL_TABLET | Freq: Once | ORAL | Status: DC
  Filled 2014-01-25: qty 1
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
## 2014-01-25 NOTE — Discharge Instructions (Signed)
Take tylenol as needed for pain.  Apply heating pad or ice pack.  Avoid activities that aggravate pain.   Follow up your doctor if your pain has not started to improve by the end of the week.  Return to the ER if your flank pain worsens, you develop blood in urine, you have weakness or numbness in your right leg.

## 2014-01-27 NOTE — ED Provider Notes (Signed)
Medical screening examination/treatment/procedure(s) were performed by non-physician practitioner and as supervising physician I was immediately available for consultation/collaboration.   EKG Interpretation None       Richarda Blade, MD 01/27/14 [phone]

## 2014-02-02 ENCOUNTER — Ambulatory Visit: Payer: Self-pay | Admitting: Internal Medicine

## 2014-02-02 ENCOUNTER — Encounter: Payer: Self-pay | Admitting: Internal Medicine

## 2014-02-02 ENCOUNTER — Encounter: Payer: Self-pay | Admitting: Dietician

## 2014-02-02 ENCOUNTER — Ambulatory Visit (INDEPENDENT_AMBULATORY_CARE_PROVIDER_SITE_OTHER): Payer: Self-pay | Admitting: Internal Medicine

## 2014-02-02 VITALS — BP 142/70 | HR 84 | Temp 97.8°F | Ht 64.0 in

## 2014-02-02 DIAGNOSIS — E1142 Type 2 diabetes mellitus with diabetic polyneuropathy: Secondary | ICD-10-CM

## 2014-02-02 DIAGNOSIS — S79919A Unspecified injury of unspecified hip, initial encounter: Secondary | ICD-10-CM

## 2014-02-02 DIAGNOSIS — S79929A Unspecified injury of unspecified thigh, initial encounter: Secondary | ICD-10-CM

## 2014-02-02 DIAGNOSIS — E1165 Type 2 diabetes mellitus with hyperglycemia: Principal | ICD-10-CM

## 2014-02-02 DIAGNOSIS — E1140 Type 2 diabetes mellitus with diabetic neuropathy, unspecified: Secondary | ICD-10-CM

## 2014-02-02 DIAGNOSIS — IMO0002 Reserved for concepts with insufficient information to code with codable children: Secondary | ICD-10-CM

## 2014-02-02 DIAGNOSIS — E1149 Type 2 diabetes mellitus with other diabetic neurological complication: Secondary | ICD-10-CM

## 2014-02-02 DIAGNOSIS — T1490XA Injury, unspecified, initial encounter: Secondary | ICD-10-CM

## 2014-02-02 LAB — HM DIABETES EYE EXAM

## 2014-02-02 LAB — GLUCOSE, CAPILLARY: Glucose-Capillary: 166 mg/dL — ABNORMAL HIGH (ref 70–99)

## 2014-02-02 LAB — POCT GLYCOSYLATED HEMOGLOBIN (HGB A1C): Hemoglobin A1C: 7.8

## 2014-02-02 MED ORDER — ACETAMINOPHEN 500 MG PO TABS: 1000.0000 mg | ORAL_TABLET | Freq: Three times a day (TID) | ORAL | Status: AC | PRN

## 2014-02-02 MED ORDER — IBUPROFEN 800 MG PO TABS: 800.0000 mg | ORAL_TABLET | Freq: Three times a day (TID) | ORAL | Status: DC | PRN

## 2014-02-02 MED ORDER — CAMPHOR-MENTHOL-METHYL SAL 4-10-30 % EX CREA: TOPICAL_CREAM | CUTANEOUS | Status: DC
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
## 2014-02-02 NOTE — Progress Notes (Signed)
Patient ID: Sherri Gill, female   DOB: 26-Mar-1951, 64 y.o.   MRN: [id number] Images of retina taken and transmitted today. Patient informed that if results indicate need for referral to eye doctor, she will be notified when report is  received by our office.

## 2014-02-02 NOTE — Assessment & Plan Note (Signed)
A1C is 7.8 today. Patient states that she was taking Metformin 500mg  BID before and she has been taking 1000mg  BID since the middle of last month. She also states she is not watching her diet.   Plans: Continue Metformin 1000 mg PO BID for now. Recommended to watch her diet and calorie intake. Recheck her A1C in 3 months time. If the A1C doesn't improve or stays the same, will consider adding Sulfonylureas to the regimen. Patient is aware of this plan.

## 2014-02-02 NOTE — Patient Instructions (Addendum)
Take Tylenol and Ibuprofen as instructed. Use Bengay Cream as advised topically twice a day. If your symptoms worsen or do not improve in 2 weeks, please give [place] a call. Take Ibuprofen with meals to reduce stomach upset.
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
## 2014-02-04 ENCOUNTER — Telehealth: Payer: Self-pay | Admitting: *Deleted

## 2014-02-04 NOTE — Telephone Encounter (Signed)
Patient call transferred from triage nurse: patient concerned about higher than usual blood sugars despite resuming the higher does of metformin. She has had weight loss and  Is trying to eat healthy and limit portions. She also complains of fingers tingling. Her fasting blood sugar has been around 200, this am it was 197. Her blood sugar after having eggs and steak and coffee this ma a a small orange this after noon was 221. Discussed options: medication vs trying trial of strict low carb diet and exercise. Counseled about 30 minutes/day 5 times a week as exercise goal and what foods to limit and what foods should not increase her blood sugar. She agrees to try this for a week and call us back next Friday to update [place]. Maile dher a education sheet on what can i eat- that contains information about what food do and do not contain carbs.
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
## 2014-02-05 NOTE — Progress Notes (Signed)
I saw and evaluated the patient.  I personally confirmed the key portions of Dr. Daneil Dolin history and exam and reviewed pertinent patient test results.  The assessment, diagnosis, and plan were formulated together and I agree with the documentation in the resident's note.  A letter was provided to her for her work.

## 2014-02-08 ENCOUNTER — Ambulatory Visit: Payer: Self-pay | Admitting: Internal Medicine

## 2014-02-09 ENCOUNTER — Encounter: Payer: Self-pay | Admitting: Internal Medicine

## 2014-02-09 ENCOUNTER — Ambulatory Visit (INDEPENDENT_AMBULATORY_CARE_PROVIDER_SITE_OTHER): Payer: Self-pay | Admitting: Internal Medicine

## 2014-02-09 DIAGNOSIS — G5600 Carpal tunnel syndrome, unspecified upper limb: Secondary | ICD-10-CM

## 2014-02-09 DIAGNOSIS — E119 Type 2 diabetes mellitus without complications: Secondary | ICD-10-CM

## 2014-02-09 DIAGNOSIS — G589 Mononeuropathy, unspecified: Secondary | ICD-10-CM

## 2014-02-09 DIAGNOSIS — T1490XA Injury, unspecified, initial encounter: Secondary | ICD-10-CM

## 2014-02-09 DIAGNOSIS — G629 Polyneuropathy, unspecified: Secondary | ICD-10-CM

## 2014-02-09 DIAGNOSIS — I10 Essential (primary) hypertension: Secondary | ICD-10-CM

## 2014-02-09 LAB — GLUCOSE, CAPILLARY: Glucose-Capillary: 80 mg/dL (ref 70–99)

## 2014-02-09 MED ORDER — GABAPENTIN (ONCE-DAILY) 300 MG PO TABS: 300.0000 mg | ORAL_TABLET | Freq: Three times a day (TID) | ORAL | Status: DC

## 2014-02-09 NOTE — Patient Instructions (Signed)
-  You have carpal tunnel syndrome in your left wrist. Start using the wrist splint for your left wrist.  -Call the Sports Medicine Office to make an appointment.  -Start taking gabapentin 300mg  three times per day for neuropathy pain.  -Follow up with [place] as needed for your pain. Follow up in 3 months for diabetes and blood pressure care.   Please bring your medicines with you each time you come.   Medicines may be  Eye drops  Herbal   Vitamins  Pills  Seeing these help [place] take care of you.   Carpal Tunnel Syndrome The carpal tunnel is an area under the skin of the palm of your hand. Nerves, blood vessels, and strong tissues (tendons) pass through the tunnel. The tunnel can become puffy (swollen). If this happens, a nerve can be pinched in the wrist. This causes carpal tunnel syndrome.  HOME CARE  Take all medicine as told by your doctor.  If you were given a splint, wear it as told. Wear it at night or at times when your doctor told you to.  Rest your wrist from the activity that causes your pain.  Put ice on your wrist after long periods of wrist activity.  Put ice in a plastic bag.  Place a towel between your skin and the bag.  Leave the ice on for 15-20 minutes, 03-04 times a day.  Keep all doctor visits as told. GET HELP RIGHT AWAY IF:  You have new problems you cannot explain.  Your problems get worse and medicine does not help. MAKE SURE YOU:   Understand these instructions.  Will watch your condition.  Will get help right away if you are not doing well or get worse. Document Released: 10/03/2011 Document Revised: 01/06/2012 Document Reviewed: 10/03/2011 [facility] Patient Information 2014 [place], [place].

## 2014-02-11 DIAGNOSIS — G5600 Carpal tunnel syndrome, unspecified upper limb: Secondary | ICD-10-CM | POA: Insufficient documentation

## 2014-02-11 HISTORY — DX: Carpal tunnel syndrome, unspecified upper limb: G56.00
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
## 2014-02-11 NOTE — Progress Notes (Signed)
I have reviewed active problems, physical findings, and medications with resident physician Dr. Adele Barthel and I concur with management plan. Murriel Hopper, MD, [place]  Hematology-Oncology/Internal Medicine

## 2014-02-11 NOTE — Assessment & Plan Note (Signed)
She has carpal tunnel syndrome of the left wrist with positive Tinel's test.  Rx left wrist splint, counseled pt to wear it at night and at work

## 2014-02-11 NOTE — Assessment & Plan Note (Signed)
BP Readings from Last 3 Encounters:  02/09/14 145/62  02/02/14 142/70  01/25/14 147/66    Lab Results  Component Value Date   NA 136 02/16/2013   K 3.9 02/16/2013   CREATININE 0.75 02/16/2013    Assessment: Blood pressure control:  At goal Progress toward BP goal:   Controlled Comments: She is on chlorthalidone 12.5 mg daily and losartan 100mg  daily. Her BP is well controlled despite Advil use.   Plan: Medications:  continue current medications Educational resources provided: brochure Self management tools provided:   Other plans: Pt to follow up in 3 months for BP recheck

## 2014-02-11 NOTE — Assessment & Plan Note (Signed)
She has had intermittent "pins and needles" sensation of her bilateral feet for months to yr now. This is likely neuropathy 2/2 to her diabetes.  Rx gabapentin 300mg  TID.

## 2014-02-21 ENCOUNTER — Other Ambulatory Visit: Payer: Self-pay | Admitting: Internal Medicine

## 2014-02-21 ENCOUNTER — Other Ambulatory Visit: Payer: Self-pay | Admitting: *Deleted

## 2014-02-21 DIAGNOSIS — I10 Essential (primary) hypertension: Secondary | ICD-10-CM

## 2014-02-21 DIAGNOSIS — G629 Polyneuropathy, unspecified: Secondary | ICD-10-CM

## 2014-02-21 MED ORDER — GABAPENTIN 300 MG PO CAPS: 300.0000 mg | ORAL_CAPSULE | Freq: Three times a day (TID) | ORAL | Status: DC

## 2014-02-22 MED ORDER — LOSARTAN POTASSIUM 100 MG PO TABS: 100.0000 mg | ORAL_TABLET | Freq: Every day | ORAL | Status: DC

## 2014-03-14 ENCOUNTER — Telehealth: Payer: Self-pay | Admitting: *Deleted

## 2014-03-14 NOTE — Telephone Encounter (Signed)
Pt called stating she is having a reaction to Neurontin - med is not helping her, her heart  is fluttering and she is agitated. She would like to try something else.  Last seen by Dr Hayes Ludwig when med ordered.

## 2014-03-14 NOTE — Telephone Encounter (Signed)
Please have patient come for full evaluation of her symptoms.    Thanks,     SK

## 2014-03-15 NOTE — Telephone Encounter (Signed)
Pt needs appt per dr Hayes Ludwig

## 2014-03-16 NOTE — Telephone Encounter (Signed)
I attempted to call the patient today and yesterday with no success.  Left messages both times asking her to give me a call back to schedule an appt.  I will try to call her again tomorrow.

## 2014-03-17 ENCOUNTER — Other Ambulatory Visit: Payer: Self-pay | Admitting: Internal Medicine

## 2014-03-17 NOTE — Telephone Encounter (Signed)
Another attempted was made today to contact this patient for an appt with no success.  A 3rd detailed message was left asking her to call back for an appt per the request of Dr. Hayes Ludwig.

## 2014-04-22 ENCOUNTER — Other Ambulatory Visit: Payer: Self-pay | Admitting: Internal Medicine

## 2014-04-26 ENCOUNTER — Other Ambulatory Visit: Payer: Self-pay | Admitting: *Deleted

## 2014-04-27 MED ORDER — CITALOPRAM HYDROBROMIDE 20 MG PO TABS: 20.0000 mg | ORAL_TABLET | Freq: Every day | ORAL | Status: DC

## 2014-04-28 ENCOUNTER — Other Ambulatory Visit: Payer: Self-pay | Admitting: *Deleted

## 2014-04-28 MED ORDER — CHLORTHALIDONE 25 MG PO TABS: 12.5000 mg | ORAL_TABLET | Freq: Every day | ORAL | Status: DC

## 2014-06-01 ENCOUNTER — Other Ambulatory Visit: Payer: Self-pay | Admitting: *Deleted

## 2014-06-01 MED ORDER — METFORMIN HCL 1000 MG PO TABS: ORAL_TABLET | ORAL | Status: DC

## 2014-06-24 ENCOUNTER — Encounter (HOSPITAL_COMMUNITY): Payer: Self-pay | Admitting: Emergency Medicine

## 2014-06-24 ENCOUNTER — Inpatient Hospital Stay (HOSPITAL_COMMUNITY)
Admission: EM | Admit: 2014-06-24 | Discharge: 2014-06-30 | DRG: 075 | Disposition: A | Discharge status: Home / Self Care | Payer: Self-pay | Attending: Internal Medicine | Admitting: Internal Medicine

## 2014-06-24 ENCOUNTER — Emergency Department (HOSPITAL_COMMUNITY): Payer: Self-pay

## 2014-06-24 DIAGNOSIS — E1140 Type 2 diabetes mellitus with diabetic neuropathy, unspecified: Secondary | ICD-10-CM

## 2014-06-24 DIAGNOSIS — B003 Herpesviral meningitis: Principal | ICD-10-CM | POA: Diagnosis present

## 2014-06-24 DIAGNOSIS — Z87891 Personal history of nicotine dependence: Secondary | ICD-10-CM

## 2014-06-24 DIAGNOSIS — E1149 Type 2 diabetes mellitus with other diabetic neurological complication: Secondary | ICD-10-CM | POA: Diagnosis present

## 2014-06-24 DIAGNOSIS — I10 Essential (primary) hypertension: Secondary | ICD-10-CM | POA: Diagnosis present

## 2014-06-24 DIAGNOSIS — F3289 Other specified depressive episodes: Secondary | ICD-10-CM | POA: Diagnosis present

## 2014-06-24 DIAGNOSIS — R519 Headache, unspecified: Secondary | ICD-10-CM

## 2014-06-24 DIAGNOSIS — Z880 Allergy status to penicillin: Secondary | ICD-10-CM

## 2014-06-24 DIAGNOSIS — F329 Major depressive disorder, single episode, unspecified: Secondary | ICD-10-CM | POA: Diagnosis present

## 2014-06-24 DIAGNOSIS — Z6841 Body Mass Index (BMI) 40.0 and over, adult: Secondary | ICD-10-CM

## 2014-06-24 DIAGNOSIS — Z882 Allergy status to sulfonamides status: Secondary | ICD-10-CM

## 2014-06-24 DIAGNOSIS — Z885 Allergy status to narcotic agent status: Secondary | ICD-10-CM

## 2014-06-24 DIAGNOSIS — Z888 Allergy status to other drugs, medicaments and biological substances status: Secondary | ICD-10-CM

## 2014-06-24 DIAGNOSIS — E66811 Obesity, class 1: Secondary | ICD-10-CM | POA: Diagnosis present

## 2014-06-24 DIAGNOSIS — E785 Hyperlipidemia, unspecified: Secondary | ICD-10-CM | POA: Diagnosis present

## 2014-06-24 DIAGNOSIS — R509 Fever, unspecified: Secondary | ICD-10-CM

## 2014-06-24 DIAGNOSIS — R51 Headache: Secondary | ICD-10-CM

## 2014-06-24 DIAGNOSIS — E1142 Type 2 diabetes mellitus with diabetic polyneuropathy: Secondary | ICD-10-CM | POA: Diagnosis present

## 2014-06-24 DIAGNOSIS — Z7982 Long term (current) use of aspirin: Secondary | ICD-10-CM

## 2014-06-24 DIAGNOSIS — D649 Anemia, unspecified: Secondary | ICD-10-CM | POA: Diagnosis present

## 2014-06-24 DIAGNOSIS — E669 Obesity, unspecified: Secondary | ICD-10-CM | POA: Diagnosis present

## 2014-06-24 DIAGNOSIS — IMO0002 Reserved for concepts with insufficient information to code with codable children: Secondary | ICD-10-CM | POA: Diagnosis present

## 2014-06-24 DIAGNOSIS — D72829 Elevated white blood cell count, unspecified: Secondary | ICD-10-CM

## 2014-06-24 DIAGNOSIS — K219 Gastro-esophageal reflux disease without esophagitis: Secondary | ICD-10-CM | POA: Diagnosis present

## 2014-06-24 DIAGNOSIS — D509 Iron deficiency anemia, unspecified: Secondary | ICD-10-CM | POA: Diagnosis present

## 2014-06-24 DIAGNOSIS — E876 Hypokalemia: Secondary | ICD-10-CM | POA: Diagnosis not present

## 2014-06-24 DIAGNOSIS — A879 Viral meningitis, unspecified: Secondary | ICD-10-CM

## 2014-06-24 DIAGNOSIS — F0390 Unspecified dementia without behavioral disturbance: Secondary | ICD-10-CM | POA: Diagnosis present

## 2014-06-24 DIAGNOSIS — E1165 Type 2 diabetes mellitus with hyperglycemia: Secondary | ICD-10-CM

## 2014-06-24 LAB — CBC WITH DIFFERENTIAL/PLATELET
Basophils Absolute: 0 10*3/uL (ref 0.0–0.1)
Basophils Relative: 0 % (ref 0–1)
EOS ABS: 0.2 10*3/uL (ref 0.0–0.7)
Eosinophils Relative: 1 % (ref 0–5)
HCT: 34.7 % — ABNORMAL LOW (ref 36.0–46.0)
Hemoglobin: 11.3 g/dL — ABNORMAL LOW (ref 12.0–15.0)
Lymphocytes Relative: 17 % (ref 12–46)
Lymphs Abs: 2 10*3/uL (ref 0.7–4.0)
MCH: 25.2 pg — AB (ref 26.0–34.0)
MCHC: 32.6 g/dL (ref 30.0–36.0)
MCV: 77.5 fL — ABNORMAL LOW (ref 78.0–100.0)
Monocytes Absolute: 0.7 10*3/uL (ref 0.1–1.0)
Monocytes Relative: 6 % (ref 3–12)
Neutro Abs: 8.5 10*3/uL — ABNORMAL HIGH (ref 1.7–7.7)
Neutrophils Relative %: 76 % (ref 43–77)
PLATELETS: 367 10*3/uL (ref 150–400)
RBC: 4.48 MIL/uL (ref 3.87–5.11)
RDW: 15.2 % (ref 11.5–15.5)
WBC: 11.3 10*3/uL — ABNORMAL HIGH (ref 4.0–10.5)

## 2014-06-24 LAB — BASIC METABOLIC PANEL
Anion gap: 13 (ref 5–15)
BUN: 12 mg/dL (ref 6–23)
CALCIUM: 9.7 mg/dL (ref 8.4–10.5)
CO2: 30 mEq/L (ref 19–32)
Chloride: 94 mEq/L — ABNORMAL LOW (ref 96–112)
Creatinine, Ser: 0.71 mg/dL (ref 0.50–1.10)
GFR calc Af Amer: >90 mL/min (ref >90)
Glucose, Bld: 174 mg/dL — ABNORMAL HIGH (ref 70–99)
Potassium: 3.6 mEq/L — ABNORMAL LOW (ref 3.7–5.3)
SODIUM: 137 meq/L (ref 137–147)

## 2014-06-24 LAB — URINALYSIS, ROUTINE W REFLEX MICROSCOPIC
BILIRUBIN URINE: NEGATIVE
GLUCOSE, UA: NEGATIVE mg/dL
Hgb urine dipstick: NEGATIVE
Ketones, ur: NEGATIVE mg/dL
Leukocytes, UA: NEGATIVE
Nitrite: NEGATIVE
PH: 6.5 (ref 5.0–8.0)
Protein, ur: 30 mg/dL — AB
Specific Gravity, Urine: 1.019 (ref 1.005–1.030)
Urobilinogen, UA: 1 mg/dL (ref 0.0–1.0)

## 2014-06-24 LAB — URINE MICROSCOPIC-ADD ON

## 2014-06-24 LAB — I-STAT CG4 LACTIC ACID, ED: Lactic Acid, Venous: 2.3 mmol/L — ABNORMAL HIGH (ref 0.5–2.2)

## 2014-06-24 LAB — I-STAT TROPONIN, ED: Troponin i, poc: 0 ng/mL (ref 0.00–0.08)

## 2014-06-24 MED ORDER — PROMETHAZINE HCL 25 MG/ML IJ SOLN
12.5000 mg | Freq: Once | INTRAMUSCULAR | Status: AC
  Administered 2014-06-24: 12.5 mg via INTRAVENOUS
  Filled 2014-06-24: qty 1

## 2014-06-24 MED ORDER — SODIUM CHLORIDE 0.9 % IV BOLUS (SEPSIS)
500.0000 mL | Freq: Once | INTRAVENOUS | Status: AC
  Administered 2014-06-24: 500 mL via INTRAVENOUS

## 2014-06-24 MED ORDER — OXYCODONE-ACETAMINOPHEN 5-325 MG PO TABS
1.0000 | ORAL_TABLET | Freq: Once | ORAL | Status: AC
  Administered 2014-06-24: 1 via ORAL
  Filled 2014-06-24: qty 1

## 2014-06-24 MED ORDER — ONDANSETRON 4 MG PO TBDP
8.0000 mg | ORAL_TABLET | Freq: Once | ORAL | Status: AC
  Administered 2014-06-24: 8 mg via ORAL
  Filled 2014-06-24: qty 2

## 2014-06-24 MED ORDER — LIDOCAINE HCL (PF) 1 % IJ SOLN
INTRAMUSCULAR | Status: AC
  Filled 2014-06-24: qty 5

## 2014-06-24 NOTE — ED Provider Notes (Signed)
CSN: [id number]     Arrival date & time 06/24/14  1649 History   First MD Initiated Contact with Patient 06/24/14 2125     Chief Complaint  Patient presents with  . Headache     (Consider location/radiation/quality/duration/timing/severity/associated sxs/prior Treatment) HPI Comments: Sherri Gill is a 63 y.o. female with a PMHx of depression, anemia, HTN, DM2 with neuropathy, GERD, and obesity and remote subjective hx of meningitis (not listed in medical diagnoses), who presents to the ED today with complaints of sudden onset HA at 9pm last night, 10/10, frontal, constant, worsening, radiating to her neck, unrelieved with tylenol rest and ice, and worse with movement/activity and bright light. Associated symptoms include dizziness, spots in her vision, neck pain and stiffness, nausea and vomiting x2 episodes, and subjective fever/chills without known Tmax. Pt states this feels similar to when she had meningitis, but cannot recall when that was. Denies URI symptoms, congestion, CP, SOB, cough, abd pain, diarrhea, constipation, melena, hematochezia, back pain, flank pain, urinary symptoms, recent travel or insect bites, blurred vision, ear or eye pain or discharge, focal weakness, confusion, paresthesias, syncope, myalgias, arthralgias, seizures, or rashes. No known sick contacts, states she felt like this with her prior meningitis but denies recent contact with known meningitis pt.   Patient is a 63 y.o. female presenting with headaches. The history is provided by the patient. No language interpreter was used.  Headache Pain location:  Frontal Quality: "splitting" Radiates to:  L neck and R neck Severity currently:  10/10 Severity at highest:  10/10 Onset quality:  Sudden Duration:  24 hours (last night at 9pm) Timing:  Constant Progression:  Worsening Chronicity:  New Similar to prior headaches: yes (similar to HA she experienced with prior meningitis)   Context: activity and bright  light   Relieved by:  Nothing Worsened by:  Activity, neck movement and light Ineffective treatments:  Acetaminophen, cold packs and resting in a darkened room Associated symptoms: dizziness, fever (subjective, did not take temp at home), nausea, neck pain, neck stiffness, photophobia, visual change ("spots" in vision) and vomiting   Associated symptoms: no abdominal pain, no back pain, no blurred vision, no congestion, no cough, no diarrhea, no drainage, no ear pain, no pain, no facial pain, no focal weakness, no hearing loss, no loss of balance, no myalgias, no near-syncope, no numbness, no paresthesias, no seizures, no sinus pressure, no sore throat, no tingling, no URI and no weakness     Past Medical History  Diagnosis Date  . Depression   . Chronic anemia 08/29/2012    Need colonoscopy or report. Need iron panel.   . Dyslipidemia 06/30/2007  . Essential hypertension, benign 06/30/2007  . Type 2 diabetes mellitus, uncontrolled, with neuropathy 06/30/2007  . GERD 06/30/2007  . Insomnia 06/11/2012  . Peripheral neuropathy 12/28/2009  . Obesity, BMI 35-40 06/11/2012   Past Surgical History  Procedure Laterality Date  . Tubal ligation    . Colonoscopy w/ polypectomy  9/05   Family History  Problem Relation Age of Onset  . Dementia Mother    History  Substance Use Topics  . Smoking status: Former Smoker    Quit date: 10/28/1978  . Smokeless tobacco: Not on file  . Alcohol Use: No   OB History   Grav Para Term Preterm Abortions TAB SAB Ect Mult Living                 Review of Systems  Constitutional: Positive for fever (subjective, did not  take temp at home) and chills.  HENT: Negative for congestion, ear discharge, ear pain, hearing loss, postnasal drip, sinus pressure and sore throat.   Eyes: Positive for photophobia and visual disturbance ("spots" in her vision). Negative for blurred vision and pain.  Respiratory: Negative for cough, chest tightness and shortness of breath.    Cardiovascular: Negative for chest pain and near-syncope.  Gastrointestinal: Positive for nausea and vomiting. Negative for abdominal pain, diarrhea, constipation and blood in stool.  Genitourinary: Negative for dysuria and hematuria.  Musculoskeletal: Positive for neck pain and neck stiffness. Negative for arthralgias, back pain, joint swelling and myalgias.  Skin: Negative for rash.  Neurological: Positive for dizziness and headaches. Negative for tremors, focal weakness, seizures, syncope, facial asymmetry, weakness, light-headedness, numbness, paresthesias and loss of balance.  Psychiatric/Behavioral: Negative for confusion.  10 Systems reviewed and are negative for acute change except as noted in the HPI.     Allergies  Codeine sulfate; Penicillins; Percocet; and Lisinopril  Home Medications   Prior to Admission medications   Medication Sig Start Date End Date Taking? Authorizing Provider  acetaminophen (TYLENOL) 500 MG tablet Take 2 tablets (1,000 mg total) by mouth every 8 (eight) hours as needed. 02/02/14 02/02/15 Yes Vijaya Mercer Pod, MD  aspirin (ANACIN) 81 MG EC tablet Take 81 mg by mouth daily.     Yes Historical Provider, MD  Biotin 5000 MCG CAPS Take 10,000 mcg by mouth every morning.   Yes Historical Provider, MD  Chlorphen-Phenyleph-ASA (ALKA-SELTZER PLUS COLD) 2-7.8-325 MG TBEF Take 1 tablet by mouth daily as needed (for cold).   Yes Historical Provider, MD  chlorthalidone (HYGROTON) 25 MG tablet Take 0.5 tablets (12.5 mg total) by mouth daily. 04/28/14  Yes Alex Ronnie Derby, DO  citalopram (CELEXA) 20 MG tablet Take 1 tablet (20 mg total) by mouth daily.   Yes Francesca Oman, DO  ibuprofen (ADVIL,MOTRIN) 800 MG tablet Take 1 tablet (800 mg total) by mouth every 8 (eight) hours as needed. 02/02/14  Yes Vijaya Mercer Pod, MD  losartan (COZAAR) 100 MG tablet Take 1 tablet (100 mg total) by mouth daily.   Yes Francesca Oman, DO  metFORMIN (GLUCOPHAGE) 1000 MG tablet Take  1,000 mg by mouth 2 (two) times daily with a meal.   Yes Historical Provider, MD  pravastatin (PRAVACHOL) 40 MG tablet Take 1 tablet (40 mg total) by mouth daily. 08/16/13  Yes Karren Cobble, MD  psyllium (METAMUCIL) 58.6 % packet Take 1 packet by mouth daily as needed (for fiber supplement).   Yes Historical Provider, MD   BP 153/64  Pulse 90  Temp(Src) 100.3 F (37.9 C) (Rectal)  Resp 20  Ht 5\' 3"  (1.6 m)  Wt 210 lb (95.255 kg)  BMI 37.21 kg/m2  SpO2 93% Physical Exam  Nursing note and vitals reviewed. Constitutional: She is oriented to person, place, and time. She appears well-developed and well-nourished. She appears ill. She appears distressed (in pain).  Rectal temp 100.5F, ill appearing, slightly distressed due to pain, alert and cooperative  HENT:  Head: Normocephalic and atraumatic.  Nose: Nose normal.  Mouth/Throat: Oropharynx is clear and moist and mucous membranes are normal.  Eyes: Conjunctivae and EOM are normal. Pupils are equal, round, and reactive to light. Right eye exhibits no discharge. Left eye exhibits no discharge.  PERRL, EOMI, conjunctiva noninjected, visual fields intact  Neck: Muscular tenderness present. No spinous process tenderness present. Rigidity present. Decreased range of motion present. Brudzinski's sign and Kernig's sign noted.  Nuchal rigidity with +brudzinski and +kernig, no spinous process TTP but +paracervical muscle TTP, decreased ROM secondary to rigidity and pain  Cardiovascular: Normal rate, regular rhythm, normal heart sounds and intact distal pulses.   No murmur heard. HR ranging from 90-100s, reg rhythm, nl s1/s2, no m/r/g, distal pulses intact  Pulmonary/Chest: Effort normal and breath sounds normal. No respiratory distress. She has no decreased breath sounds. She has no wheezes. She has no rhonchi. She has no rales.  Abdominal: Soft. Normal appearance and bowel sounds are normal. She exhibits no distension. There is no tenderness.  There is no rigidity, no rebound and no guarding.  Musculoskeletal:       Cervical back: She exhibits decreased range of motion and tenderness.  Moving all extremities equally but making small movements and appears to avoid movements of neck. C-spine exam as above. Strength 5/5 in all extremities, sensation grossly intact in all extremities to light and sharp touch  Neurological: She is alert and oriented to person, place, and time. She has normal strength. No cranial nerve deficit or sensory deficit. Coordination normal. GCS eye subscore is 4. GCS verbal subscore is 5. GCS motor subscore is 6.  GCS 15, A&Ox4, CN2-12 grossly intact, strength 5/5 in all extremities, sensation grossly intact in all extremities, finger-to-nose coordination within normal limits, unable to assess DTR's due to reluctance of pt to sit up, unable to assess gait due to pain  Skin: Skin is warm, dry and intact. No petechiae, no purpura and no rash noted.  No petechiae or purpura  Psychiatric: She has a normal mood and affect. Her behavior is normal. Cognition and memory are normal.    ED Course  Procedures (including critical care time) Labs Review Labs Reviewed  CBC WITH DIFFERENTIAL - Abnormal; Notable for the following:    WBC 11.3 (*)    Hemoglobin 11.3 (*)    HCT 34.7 (*)    MCV 77.5 (*)    MCH 25.2 (*)    Neutro Abs 8.5 (*)    All other components within normal limits  BASIC METABOLIC PANEL - Abnormal; Notable for the following:    Potassium 3.6 (*)    Chloride 94 (*)    Glucose, Bld 174 (*)    All other components within normal limits  URINALYSIS, ROUTINE W REFLEX MICROSCOPIC - Abnormal; Notable for the following:    APPearance CLOUDY (*)    Protein, ur 30 (*)    All other components within normal limits  URINE MICROSCOPIC-ADD ON - Abnormal; Notable for the following:    Squamous Epithelial / LPF FEW (*)    All other components within normal limits  I-STAT CG4 LACTIC ACID, ED - Abnormal; Notable for  the following:    Lactic Acid, Venous 2.30 (*)    All other components within normal limits  CULTURE, BLOOD (ROUTINE X 2)  CULTURE, BLOOD (ROUTINE X 2)  URINE CULTURE  CSF CULTURE  GRAM STAIN  CSF CELL COUNT WITH DIFFERENTIAL  CSF CELL COUNT WITH DIFFERENTIAL  GLUCOSE, CSF  PROTEIN, CSF  HERPES SIMPLEX VIRUS(HSV) DNA BY PCR  HIV ANTIBODY (ROUTINE TESTING)  I-STAT TROPOININ, ED    Imaging Review Ct Head Wo Contrast  06/24/2014   CLINICAL DATA:  Headache dizziness and neck pain with increasing frequency.  EXAM: CT HEAD WITHOUT CONTRAST  TECHNIQUE: Contiguous axial images were obtained from the base of the skull through the vertex without intravenous contrast.  COMPARISON:  None.  FINDINGS: No acute cortical infarct, hemorrhage,  or mass lesion is present. The ventricles are of normal size. No significant extra-axial fluid collection is evident. The paranasal sinuses and mastoid air cells are clear. The osseous skull is intact.  IMPRESSION: Negative CT of the head.   Electronically Signed   By: Lawrence Santiago M.D.   On: 06/24/2014 23:08     EKG Interpretation None      MDM   Final diagnoses:  None    63y/o female with HA concerning for meningitis or increased intracranial pressure. HTN noted, will obtain labs to r/o end organ damage. Will obtain head CT and plan for LP. Will give phenergan now, given that zofran caused nausea. Percocet given with some relief. Will hold off on more pain meds now in order to be able to continue assessing changing in neuro status, will start 590ml bolus now. CBC obtained in triage showing mildly high WBC at 11.3, and baseline chronic anemia. BMP showing mildly low K at 3.6, Cl 94, glucose at baseline 174, no anion gap. Will reassess shortly.  12:24 AM Lactic acid 2.3, trop neg, EKG relatively unremarkable, no STEMI. U/A showing no signs of infection, sent for culture given possible fever. CT head neg. Dr. Joseph Berkshire performed LP, please see his  dictation for documentation of this procedure. I observed during this procedure, opening pressure high, several attempts needed due to body habitus. Blood cultures pending. Will consult for admission to internal medicine for admission to f/up with LP results, high suspicion for meningitis. Will give morphine now, no change in neuro status throughout ED stay.  1:00 AM Dr. Algis Liming returning consult page. Will start IV abx now, service will see for admission. Please see her dictation for further documentation of care and results.    Patty Sermons [place], [place] 06/25/14 [phone]

## 2014-06-24 NOTE — ED Notes (Signed)
Patient reports she had recent visitor for meningitis. Added droplet precautions.

## 2014-06-24 NOTE — ED Notes (Signed)
Patient still off the unit.

## 2014-06-24 NOTE — ED Notes (Signed)
Phlebotomy came to draw blood cultures, but patient is off the unit for testing.

## 2014-06-24 NOTE — ED Notes (Signed)
Mercades, PA-C, at the bedside.

## 2014-06-24 NOTE — ED Notes (Signed)
EKG given to Mercades.

## 2014-06-24 NOTE — ED Notes (Signed)
Pt. Reports headache, dizziness and neck pain. States pain was intermittent at first but is now coming and going. Reports recently been around someone who was sick with similar symptoms.

## 2014-06-25 ENCOUNTER — Inpatient Hospital Stay (HOSPITAL_COMMUNITY): Payer: Self-pay

## 2014-06-25 ENCOUNTER — Encounter (HOSPITAL_COMMUNITY): Payer: Self-pay | Admitting: *Deleted

## 2014-06-25 DIAGNOSIS — F3289 Other specified depressive episodes: Secondary | ICD-10-CM

## 2014-06-25 DIAGNOSIS — R079 Chest pain, unspecified: Secondary | ICD-10-CM

## 2014-06-25 DIAGNOSIS — I10 Essential (primary) hypertension: Secondary | ICD-10-CM

## 2014-06-25 DIAGNOSIS — E1142 Type 2 diabetes mellitus with diabetic polyneuropathy: Secondary | ICD-10-CM

## 2014-06-25 DIAGNOSIS — E1149 Type 2 diabetes mellitus with other diabetic neurological complication: Secondary | ICD-10-CM

## 2014-06-25 DIAGNOSIS — F329 Major depressive disorder, single episode, unspecified: Secondary | ICD-10-CM

## 2014-06-25 DIAGNOSIS — R51 Headache: Secondary | ICD-10-CM

## 2014-06-25 LAB — CSF CELL COUNT WITH DIFFERENTIAL
EOS CSF: 0 % (ref 0–1)
Eosinophils, CSF: 0 % (ref 0–1)
Lymphs, CSF: 98 % — ABNORMAL HIGH (ref 40–80)
Lymphs, CSF: 99 % — ABNORMAL HIGH (ref 40–80)
Monocyte-Macrophage-Spinal Fluid: 1 % — ABNORMAL LOW (ref 15–45)
Monocyte-Macrophage-Spinal Fluid: 1 % — ABNORMAL LOW (ref 15–45)
RBC Count, CSF: 1020 /mm3 — ABNORMAL HIGH
RBC Count, CSF: 1440 /mm3 — ABNORMAL HIGH
Segmented Neutrophils-CSF: 0 % (ref 0–6)
Segmented Neutrophils-CSF: 1 % (ref 0–6)
Tube #: 1
Tube #: 4
WBC CSF: 620 /mm3 — AB (ref 0–5)
WBC, CSF: 560 /mm3 (ref 0–5)

## 2014-06-25 LAB — GLUCOSE, CAPILLARY
Glucose-Capillary: 186 mg/dL — ABNORMAL HIGH (ref 70–99)
Glucose-Capillary: 242 mg/dL — ABNORMAL HIGH (ref 70–99)
Glucose-Capillary: 289 mg/dL — ABNORMAL HIGH (ref 70–99)
Glucose-Capillary: 185 mg/dL — ABNORMAL HIGH (ref 70–99)

## 2014-06-25 LAB — COMPREHENSIVE METABOLIC PANEL WITH GFR
ALT: 20 U/L (ref 0–35)
AST: 16 U/L (ref 0–37)
Albumin: 3.7 g/dL (ref 3.5–5.2)
Alkaline Phosphatase: 103 U/L (ref 39–117)
Anion gap: 14 (ref 5–15)
BUN: 12 mg/dL (ref 6–23)
CO2: 28 meq/L (ref 19–32)
Calcium: 8.9 mg/dL (ref 8.4–10.5)
Chloride: 94 meq/L — ABNORMAL LOW (ref 96–112)
Creatinine, Ser: 0.64 mg/dL (ref 0.50–1.10)
GFR calc Af Amer: >90 mL/min (ref >90)
GFR calc non Af Amer: >90 mL/min (ref >90)
Glucose, Bld: 235 mg/dL — ABNORMAL HIGH (ref 70–99)
Potassium: 3.4 meq/L — ABNORMAL LOW (ref 3.7–5.3)
Sodium: 136 meq/L — ABNORMAL LOW (ref 137–147)
Total Bilirubin: 0.3 mg/dL (ref 0.3–1.2)
Total Protein: 7.3 g/dL (ref 6.0–8.3)

## 2014-06-25 LAB — GLUCOSE, CSF: Glucose, CSF: 90 mg/dL — ABNORMAL HIGH (ref 43–76)

## 2014-06-25 LAB — MRSA PCR SCREENING: MRSA by PCR: NEGATIVE

## 2014-06-25 LAB — CBC
HEMATOCRIT: 32 % — AB (ref 36.0–46.0)
Hemoglobin: 10.3 g/dL — ABNORMAL LOW (ref 12.0–15.0)
MCH: 24.9 pg — ABNORMAL LOW (ref 26.0–34.0)
MCHC: 32.2 g/dL (ref 30.0–36.0)
MCV: 77.5 fL — ABNORMAL LOW (ref 78.0–100.0)
Platelets: 348 10*3/uL (ref 150–400)
RBC: 4.13 MIL/uL (ref 3.87–5.11)
RDW: 15.2 % (ref 11.5–15.5)
WBC: 12.1 10*3/uL — AB (ref 4.0–10.5)

## 2014-06-25 LAB — GRAM STAIN

## 2014-06-25 LAB — HEMOGLOBIN A1C
Hgb A1c MFr Bld: 7.6 % — ABNORMAL HIGH (ref <5.7)
Mean Plasma Glucose: 171 mg/dL — ABNORMAL HIGH (ref <117)

## 2014-06-25 LAB — TSH: TSH: 0.85 u[IU]/mL (ref 0.350–4.500)

## 2014-06-25 LAB — PROTEIN, CSF: TOTAL PROTEIN, CSF: 105 mg/dL — AB (ref 15–45)

## 2014-06-25 LAB — HIV ANTIBODY (ROUTINE TESTING W REFLEX): HIV 1&2 Ab, 4th Generation: NONREACTIVE

## 2014-06-25 MED ORDER — LIDOCAINE HCL (PF) 1 % IJ SOLN
5.0000 mL | Freq: Once | INTRAMUSCULAR | Status: AC
  Administered 2014-06-24: 5 mL via INTRADERMAL
  Filled 2014-06-25: qty 5

## 2014-06-25 MED ORDER — SODIUM CHLORIDE 0.9 % IV SOLN
INTRAVENOUS | Status: DC
  Administered 2014-06-25: 04:00:00 via INTRAVENOUS
  Administered 2014-06-27: 75 mL/h via INTRAVENOUS

## 2014-06-25 MED ORDER — DEXTROSE 5 % IV SOLN
2.0000 g | Freq: Two times a day (BID) | INTRAVENOUS | Status: DC
  Administered 2014-06-25 (×2): 2 g via INTRAVENOUS
  Filled 2014-06-25 (×3): qty 2

## 2014-06-25 MED ORDER — SODIUM CHLORIDE 0.9 % IJ SOLN
3.0000 mL | Freq: Two times a day (BID) | INTRAMUSCULAR | Status: DC
  Administered 2014-06-25 – 2014-06-26 (×3): 3 mL via INTRAVENOUS

## 2014-06-25 MED ORDER — INSULIN ASPART 100 UNIT/ML ~~LOC~~ SOLN
0.0000 [IU] | Freq: Three times a day (TID) | SUBCUTANEOUS | Status: DC
  Administered 2014-06-25 (×2): 8 [IU] via SUBCUTANEOUS
  Administered 2014-06-25: 5 [IU] via SUBCUTANEOUS
  Administered 2014-06-26: 3 [IU] via SUBCUTANEOUS
  Administered 2014-06-26: 2 [IU] via SUBCUTANEOUS
  Administered 2014-06-27 (×3): 3 [IU] via SUBCUTANEOUS
  Administered 2014-06-28: 2 [IU] via SUBCUTANEOUS
  Administered 2014-06-28: 5 [IU] via SUBCUTANEOUS
  Administered 2014-06-28: 2 [IU] via SUBCUTANEOUS
  Administered 2014-06-29: 3 [IU] via SUBCUTANEOUS
  Administered 2014-06-29 (×2): 2 [IU] via SUBCUTANEOUS
  Administered 2014-06-30: 3 [IU] via SUBCUTANEOUS

## 2014-06-25 MED ORDER — SODIUM CHLORIDE 0.9 % IV SOLN
2.0000 g | Freq: Four times a day (QID) | INTRAVENOUS | Status: DC
  Administered 2014-06-25 (×2): 2 g via INTRAVENOUS
  Filled 2014-06-25 (×5): qty 2000

## 2014-06-25 MED ORDER — ACETAMINOPHEN 325 MG PO TABS
650.0000 mg | ORAL_TABLET | Freq: Four times a day (QID) | ORAL | Status: DC | PRN
  Administered 2014-06-25 – 2014-06-27 (×6): 650 mg via ORAL
  Filled 2014-06-25 (×7): qty 2

## 2014-06-25 MED ORDER — MORPHINE SULFATE 4 MG/ML IJ SOLN
4.0000 mg | Freq: Once | INTRAMUSCULAR | Status: AC
  Administered 2014-06-25: 4 mg via INTRAVENOUS
  Filled 2014-06-25: qty 1

## 2014-06-25 MED ORDER — ONDANSETRON HCL 4 MG PO TABS
4.0000 mg | ORAL_TABLET | Freq: Four times a day (QID) | ORAL | Status: DC | PRN
  Filled 2014-06-25: qty 1

## 2014-06-25 MED ORDER — LOSARTAN POTASSIUM 50 MG PO TABS
100.0000 mg | ORAL_TABLET | Freq: Every day | ORAL | Status: DC
  Administered 2014-06-25: 100 mg via ORAL
  Filled 2014-06-25: qty 2

## 2014-06-25 MED ORDER — MORPHINE SULFATE 2 MG/ML IJ SOLN
2.0000 mg | INTRAMUSCULAR | Status: DC | PRN
  Administered 2014-06-26 – 2014-06-28 (×4): 2 mg via INTRAVENOUS
  Filled 2014-06-25 (×4): qty 1

## 2014-06-25 MED ORDER — CITALOPRAM HYDROBROMIDE 20 MG PO TABS
20.0000 mg | ORAL_TABLET | Freq: Every day | ORAL | Status: DC
  Administered 2014-06-25 – 2014-06-30 (×6): 20 mg via ORAL
  Filled 2014-06-25 (×6): qty 1

## 2014-06-25 MED ORDER — HEPARIN SODIUM (PORCINE) 5000 UNIT/ML IJ SOLN
5000.0000 [IU] | Freq: Three times a day (TID) | INTRAMUSCULAR | Status: DC
  Administered 2014-06-25: 5000 [IU] via SUBCUTANEOUS
  Filled 2014-06-25 (×5): qty 1

## 2014-06-25 MED ORDER — ONDANSETRON HCL 4 MG/2ML IJ SOLN: 4.0000 mg | Freq: Four times a day (QID) | INTRAMUSCULAR | Status: DC | PRN

## 2014-06-25 MED ORDER — CHLORTHALIDONE 25 MG PO TABS
12.5000 mg | ORAL_TABLET | Freq: Every day | ORAL | Status: DC
  Administered 2014-06-25 – 2014-06-28 (×4): 12.5 mg via ORAL
  Filled 2014-06-25 (×4): qty 0.5

## 2014-06-25 MED ORDER — VANCOMYCIN HCL 10 G IV SOLR
2000.0000 mg | Freq: Once | INTRAVENOUS | Status: AC
  Administered 2014-06-25: 2000 mg via INTRAVENOUS
  Filled 2014-06-25: qty 2000

## 2014-06-25 MED ORDER — ONDANSETRON HCL 4 MG/2ML IJ SOLN
4.0000 mg | Freq: Once | INTRAMUSCULAR | Status: AC
  Administered 2014-06-25: 4 mg via INTRAVENOUS
  Filled 2014-06-25: qty 2

## 2014-06-25 MED ORDER — TRAMADOL HCL 50 MG PO TABS
50.0000 mg | ORAL_TABLET | Freq: Four times a day (QID) | ORAL | Status: DC | PRN
Start: 2014-06-25 — End: 2014-06-30
  Administered 2014-06-25 – 2014-06-29 (×10): 50 mg via ORAL
  Filled 2014-06-25 (×10): qty 1

## 2014-06-25 MED ORDER — DEXAMETHASONE SODIUM PHOSPHATE 10 MG/ML IJ SOLN
10.0000 mg | Freq: Once | INTRAMUSCULAR | Status: AC
  Administered 2014-06-25: 10 mg via INTRAVENOUS
  Filled 2014-06-25: qty 1

## 2014-06-25 MED ORDER — DEXTROSE 5 % IV SOLN
600.0000 mg | Freq: Three times a day (TID) | INTRAVENOUS | Status: DC
  Administered 2014-06-25 – 2014-06-27 (×6): 600 mg via INTRAVENOUS
  Filled 2014-06-25 (×9): qty 12

## 2014-06-25 MED ORDER — VANCOMYCIN HCL IN DEXTROSE 1-5 GM/200ML-% IV SOLN
1000.0000 mg | Freq: Three times a day (TID) | INTRAVENOUS | Status: DC
  Filled 2014-06-25 (×2): qty 200

## 2014-06-25 NOTE — ED Notes (Signed)
Called main lab and spoke to Mickel Baas to report patient's new lab order for HIV antibody and HSV.  HSV will be added on to CSF fluid samples.

## 2014-06-25 NOTE — ED Notes (Signed)
Called lab to inform that lumbar puncture is complete, and blood cultures are still ordered.  She acknolwedges. Explained to patient to lay flat for 1 hour after lumbar puncture.

## 2014-06-25 NOTE — Progress Notes (Addendum)
MD called back to acknowledge need of IV and is looking to consult Critical Care for Central Line placement.  Will continue to monitor.     Follow up-unable to do CTA with central line, notified MD.  No new orders at this time.

## 2014-06-25 NOTE — Progress Notes (Addendum)
ANTIBIOTIC CONSULT NOTE - INITIAL  Pharmacy Consult for vancomycin Indication: r/o meningitis  Allergies  Allergen Reactions  . Codeine Sulfate Nausea And Vomiting and Other (See Comments)    Dizziness  . Penicillins Nausea And Vomiting  . Percocet [Oxycodone-Acetaminophen] Nausea And Vomiting  . Lisinopril Cough    Patient Measurements: Height: 5\' 3"  (160 cm) Weight: 226 lb 3.1 oz (102.6 kg) IBW/kg (Calculated) : 52.4  Vital Signs: Temp: 99 F (37.2 C) (08/29 0254) Temp src: Oral (08/29 0254) BP: 147/60 mmHg (08/29 0320) Pulse Rate: 82 (08/29 0320) Intake/Output from previous day: 08/28 0701 - 08/29 0700 In: 550 [I.V.:550] Out: 200 [Urine:200] Intake/Output from this shift: Total I/O In: 550 [I.V.:550] Out: 200 [Urine:200]  Labs:  Recent Labs  06/24/14 1711  WBC 11.3*  HGB 11.3*  PLT 367  CREATININE 0.71   Estimated Creatinine Clearance: 84.5 ml/min (by C-G formula based on Cr of 0.71).   Microbiology: Recent Results (from the past 720 hour(s))  GRAM STAIN     Status: None   Collection Time    06/25/14 12:32 AM      Result Value Ref Range Status   Specimen Description CSF   Final   Special Requests NONE   Final   Gram Stain     Final   Value: [id number]     NO ORGANISMS SEEN     WBC PRESENT, PREDOMINANTLY MONONUCLEAR     Results Called to: Henrine Screws [id number]   Report Status 06/25/2014 FINAL   Final    Medical History: Past Medical History  Diagnosis Date  . Depression   . Chronic anemia 08/29/2012    Need colonoscopy or report. Need iron panel.   . Dyslipidemia 06/30/2007  . Essential hypertension, benign 06/30/2007  . Type 2 diabetes mellitus, uncontrolled, with neuropathy 06/30/2007  . GERD 06/30/2007  . Insomnia 06/11/2012  . Peripheral neuropathy 12/28/2009  . Obesity, BMI 35-40 06/11/2012    Medications:  Prescriptions prior to admission  Medication Sig Dispense Refill  . acetaminophen (TYLENOL) 500 MG tablet Take 2 tablets (1,000 mg  total) by mouth every 8 (eight) hours as needed.  60 tablet  0  . aspirin (ANACIN) 81 MG EC tablet Take 81 mg by mouth daily.        . Biotin 5000 MCG CAPS Take 10,000 mcg by mouth every morning.      . Chlorphen-Phenyleph-ASA (ALKA-SELTZER PLUS COLD) 2-7.8-325 MG TBEF Take 1 tablet by mouth daily as needed (for cold).      . chlorthalidone (HYGROTON) 25 MG tablet Take 0.5 tablets (12.5 mg total) by mouth daily.  30 tablet  1  . citalopram (CELEXA) 20 MG tablet Take 1 tablet (20 mg total) by mouth daily.  90 tablet  1  . ibuprofen (ADVIL,MOTRIN) 800 MG tablet Take 1 tablet (800 mg total) by mouth every 8 (eight) hours as needed.  30 tablet  0  . losartan (COZAAR) 100 MG tablet Take 1 tablet (100 mg total) by mouth daily.  30 tablet  5  . metFORMIN (GLUCOPHAGE) 1000 MG tablet Take 1,000 mg by mouth 2 (two) times daily with a meal.      . pravastatin (PRAVACHOL) 40 MG tablet Take 1 tablet (40 mg total) by mouth daily.  90 tablet  3  . psyllium (METAMUCIL) 58.6 % packet Take 1 packet by mouth daily as needed (for fiber supplement).       Scheduled:  . ampicillin (OMNIPEN) IV  2 g Intravenous  4 times per day  . cefTRIAXone (ROCEPHIN)  IV  2 g Intravenous Q12H  . chlorthalidone  12.5 mg Oral Daily  . citalopram  20 mg Oral Daily  . heparin  5,000 Units Subcutaneous 3 times per day  . insulin aspart  0-15 Units Subcutaneous TID WC  . lidocaine (PF)      . losartan  100 mg Oral Daily  . sodium chloride  3 mL Intravenous Q12H  . vancomycin  2,000 mg Intravenous Once  . vancomycin  1,000 mg Intravenous Q8H   Infusions:  . sodium chloride      Assessment: 63yo female c/o HA, dizziness, and neck pain, reports being in close proximity to person w/ similar sx, LP abnormal, high suspicion for meningitis, to begin IV ABX.  Goal of Therapy:  Vancomycin trough level 15-20 mcg/ml  Plan:  Rec'd vanc 2g IV in ED; will continue to vancomycin 1g IV Q8H and monitor CBC, Cx, levels prn.  Noted that pt  has PCN intolerance (N/V), pt agreeable to standard of care w/ ampicillin and Rocephin w/ Zofran.  Wynona Neat, PharmD, BCPS  06/25/2014,3:32 AM    ADDENDUM: To add antiviral coverage for meningitis.  Will start acyclovir 600mg  IV Q8H and monitor CBC and Cx.    VB 06/25/2014 7:19 AM

## 2014-06-25 NOTE — ED Notes (Signed)
Assisted with Lumbar puncture from 2335 to 0020 with Dr. Betsey Holiday.

## 2014-06-25 NOTE — ED Notes (Signed)
WBC's predominantly mono per lab.

## 2014-06-25 NOTE — ED Notes (Signed)
Dr. Sudie Grumbling returned phone call, patient has allergy to penicillins, and is ordered ampicillin. She acknowledges and orders 2mg  of zofran IV for nausea as the side effect.  Also reported WBC of 620 in CSF.  Patient is currently receiving rocephin, and will still receive vancomycin and ampicillin per discussion with Dr. Sudie Grumbling.

## 2014-06-25 NOTE — Progress Notes (Signed)
Subjective: Her headache has improved since last night but she still has bilateral leg weakness, neck stiffness, dizziness,  and photophobia. She is tolerating a diet well with no N/V. She states that her nausea and vomiting started after she took Percocet.   Objective: Vital signs in last 24 hours: Filed Vitals:   06/25/14 0320 06/25/14 0327 06/25/14 0635 06/25/14 0724  BP: 147/60  154/67 142/57  Pulse: 82  85   Temp:   99.1 F (37.3 C) 99.4 F (37.4 C)  TempSrc:   Oral Oral  Resp: 18  23   Height:  5\' 3"  (1.6 m)    Weight:  226 lb 3.1 oz (102.6 kg)    SpO2: 99%  98%    Weight change:   Intake/Output Summary (Last 24 hours) at 06/25/14 1044 Last data filed at 06/25/14 0600  Gross per 24 hour  Intake    695 ml  Output    200 ml  Net    495 ml   Vitals reviewed. General: resting in bed, in NAD HEENT: PERRL, EOMI, no scleral icterus Cardiac: RRR, no rubs, murmurs or gallops Pulm: clear to auscultation bilaterally, no wheezes, rales, or rhonchi Abd: soft, nontender, nondistended, BS present Ext: warm and well perfused, no pedal edema, no rashes noted Neuro: alert and oriented X3, strength 5/5 in bilateral LE and UE, sensation to light touch equal in bilateral upper and lower extremities. Kernigs and   Lab Results: Basic Metabolic Panel:  Recent Labs Lab 06/24/14 1711 06/25/14 0550  NA 137 136*  K 3.6* 3.4*  CL 94* 94*  CO2 30 28  GLUCOSE 174* 235*  BUN 12 12  CREATININE 0.71 0.64  CALCIUM 9.7 8.9   Liver Function Tests:  Recent Labs Lab 06/25/14 0550  AST 16  ALT 20  ALKPHOS 103  BILITOT 0.3  PROT 7.3  ALBUMIN 3.7   CBC:  Recent Labs Lab 06/24/14 1711 06/25/14 0550  WBC 11.3* 12.1*  NEUTROABS 8.5*  --   HGB 11.3* 10.3*  HCT 34.7* 32.0*  MCV 77.5* 77.5*  PLT 367 348   CBG:  Recent Labs Lab 06/25/14 0319 06/25/14 0722  GLUCAP 186* 242*   Thyroid Function Tests:  Recent Labs Lab 06/25/14 0550  TSH 0.850   Urinalysis:  Recent  Labs Lab 06/24/14 2220  COLORURINE YELLOW  LABSPEC 1.019  PHURINE 6.5  GLUCOSEU NEGATIVE  HGBUR NEGATIVE  BILIRUBINUR NEGATIVE  KETONESUR NEGATIVE  PROTEINUR 30*  UROBILINOGEN 1.0  NITRITE NEGATIVE  LEUKOCYTESUR NEGATIVE    Micro Results: Recent Results (from the past 240 hour(s))  GRAM STAIN     Status: None   Collection Time    06/25/14 12:32 AM      Result Value Ref Range Status   Specimen Description CSF   Final   Special Requests NONE   Final   Gram Stain     Final   Value: [id number]     NO ORGANISMS SEEN     WBC PRESENT, PREDOMINANTLY MONONUCLEAR     Results Called to: Henrine Screws [id number] [place]   Report Status 06/25/2014 FINAL   Final  MRSA PCR SCREENING     Status: None   Collection Time    06/25/14  3:27 AM      Result Value Ref Range Status   MRSA by PCR NEGATIVE  NEGATIVE Final   Comment:            The GeneXpert MRSA Assay (FDA  approved for NASAL specimens     only), is one component of a     comprehensive MRSA colonization     surveillance program. It is not     intended to diagnose MRSA     infection nor to guide or     monitor treatment for     MRSA infections.   Studies/Results: Ct Head Wo Contrast  06/24/2014   CLINICAL DATA:  Headache dizziness and neck pain with increasing frequency.  EXAM: CT HEAD WITHOUT CONTRAST  TECHNIQUE: Contiguous axial images were obtained from the base of the skull through the vertex without intravenous contrast.  COMPARISON:  None.  FINDINGS: No acute cortical infarct, hemorrhage, or mass lesion is present. The ventricles are of normal size. No significant extra-axial fluid collection is evident. The paranasal sinuses and mastoid air cells are clear. The osseous skull is intact.  IMPRESSION: Negative CT of the head.   Electronically Signed   By: Lawrence Santiago M.D.   On: 06/24/2014 23:08   Portable Chest 1 View  06/25/2014   CLINICAL DATA:  Meningitis  EXAM: PORTABLE CHEST - 1 VIEW  COMPARISON:  08/18/2012   FINDINGS: Cardiac shadow is prominent but accentuated by the portable technique. The overall inspiratory effort is poor with vascular crowding. A mild degree of vascular congestion is noted. No focal infiltrate is seen. No bony abnormality is noted.  IMPRESSION: Poor inspiratory effort.  Mild vascular congestion is noted.   Electronically Signed   By: Inez Catalina M.D.   On: 06/25/2014 07:20   Medications: I have reviewed the patient's current medications. Scheduled Meds: . acyclovir  600 mg Intravenous 3 times per day  . chlorthalidone  12.5 mg Oral Daily  . citalopram  20 mg Oral Daily  . heparin  5,000 Units Subcutaneous 3 times per day  . insulin aspart  0-15 Units Subcutaneous TID WC  . lidocaine (PF)      . losartan  100 mg Oral Daily  . sodium chloride  3 mL Intravenous Q12H   Continuous Infusions: . sodium chloride 75 mL/hr at 06/25/14 0700   PRN Meds:.acetaminophen, morphine injection, ondansetron (ZOFRAN) IV, ondansetron, traMADol Assessment/Plan: 63 yo woman with PMH of bacterial meningitis almost 20 yrs ago that was successfully treated with PO Abx, presenting with neck stiffness, severe headache with sudden onset 2 days prior to presentation  Sudden onset headache: She presented with neck stiffness, severe headache with sudden onset 2 days prior to presentation, photophobia and bilateral LE weakness. 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 She had LP in ED with CSF showing 1020 RBC, 420 WBC, Protein of 105, glucose 90, predominant lymphocytes, and no organisms, concerning for viral meningitis. SAH is also in the differential though her CT head is negative for acute bleed. Her headache has improved slightly with Tylenol but she has persistent photophobia and neck stiffness.  -Discontinued IV vanc, ampicillin, and ceftriaxone which she received in the ED for possible bacterial meningitis -Start  acyclovir IV for possible viral meningitis -f/u CSF HSV PCR, culture -Ordered CT angio for further eval of SAH and possible brain aneurysm -Frequent Neuro checks -Continue seizure precautions -Will formally consult Neurology pending results of CTA -Avoid anticoagulation for VTE prophylaxis -Avoid Percocet -Tramadol 50mg  q8 PRN for pain -Zofran PRN for nausea  HTN: Well controlled.  -continue home chlorthalidone 12.5mg  daily  -Hold losartan 100mg  daily in setting of IV  contrast for CTA angio per above  DM2: with peripheral neuropathy: last hgb A1c 7.8 02/02/2014  -SSI, Accuchecks Q4hr  -hold home metformin 1000mg  BID   Depression  -continue citalopram 20mg  daily   FEN  -Carb mod diet -NS@75ml /hr   DVT ppx: SCDs  Dispo: Disposition is deferred at this time, awaiting improvement of current medical problems.  Anticipated discharge in approximately 1-2 day(s).   The patient does have a current PCP Francesca Oman, DO) and does need an [facility] hospital follow-up appointment after discharge.  The patient does not have transportation limitations that hinder transportation to clinic appointments.  .Services Needed at time of discharge: Y = Yes, Blank = No PT:   OT:   RN:   Equipment:   Other:     LOS: 1 day   Blain Pais, MD 06/25/2014, 10:44 AM

## 2014-06-25 NOTE — H&P (Signed)
  Date: 06/25/2014  Patient name: Sherri Gill  Medical record number: [id number]  Date of birth: [birth year]   I have seen and evaluated Sherri Gill and discussed their care with the Residency Team. Sherri Gill was in her usual state of health until sudden onset of a severe HA assoc with photophobia, N/V, phonophobia, dizziness, and neck stiffness. She had a sick contact - her mother's hospice RN bc ill at their home. She had (by pt's report) bacterial meningitis many yrs ago, tx with PO ABX after an LP in the ED. She states this feels the same.   She had an LP in the ED this admission with 1440 RBC and xanthochromia in the first tube and 1020 and clear in tube 4. She had 420 WBC, pred lymphocytes. Protein 105 and glucose 90. Gram stain negative and HIV negative. Peripheral WBC elevated to 12.1.   On exam, she is stable in NAD. There is no skin rash. EOMI. PERRL. HRRR. LCTAB.   Assessment and Plan: I have seen and evaluated the patient as outlined above. I agree with the formulated Assessment and Plan as detailed in the residents' admission note, with the following changes:   1. Sudden onset HA - the diff now include aseptic meningitis vs SAH. ABX have been stopped and acyclovir has been started. Dr Hayes Ludwig is reaching out to neuro to get their recs since the CT was negative but CSF had xanthochromia and such a high RBC count. Neuro rec CTA to assess vasculature and prior bleeding. Will stop heparin but cont empiric acylovir since HSV meningitis is not yet R/O.   Bartholomew Crews, MD 8/29/20152:46 PM

## 2014-06-25 NOTE — Progress Notes (Signed)
Pt pulled out IV, currently still unable to get IV access after multiple attempts from RN and IV team. IV acyclovir and and CT angio are unable to be completed. Notified MD. Awaiting a response. Will continue to monitor.

## 2014-06-25 NOTE — ED Notes (Signed)
Blood cultures drawn by crystal, phlebotomy

## 2014-06-25 NOTE — H&P (Signed)
Date: 06/25/2014               Patient Name:  Sherri Gill MRN: [id number]  DOB: [birth year] Age / Sex: 63 y.o., female   PCP: Francesca Oman, DO         Medical Service: Internal Medicine Teaching Service         Attending Physician: Dr. Bartholomew Crews, MD    First Contact: Dr. Genene Churn Pager: [phone]  Second Contact: Dr. Hayes Ludwig Pager: [phone]       After Hours (After 5p/  First Contact Pager: [phone]  weekends / holidays): Second Contact Pager: [phone]   Chief Complaint: headache  History of Present Illness: Sherri Gill is a 63 year old woman with history of depression, chronic anemia, HTN, DM2 with peripheral neuropathy presenting with headache. The headache is 10/10 in severity, frontal, constant and worsening. The onset was sudden last night. It radiates to her neck. She tried tylenol, ibuprofen and alka seltzer plus with minimal relief. It is exacerbated by movement, bright light and sound. She reports subjective fevers, chills, diaphoresis, dizzines, neck pain, neck stiffness, nausea, nonbloody/nonbilious vomiting x2. She had a similar past episode that she attributed to meningitis. Denied seizures in the past. Lives at home with her mother who is a hospice patient with dementia and her son. The nurse aide that came to visit her mother was sick. No recent travel or work outdoors/insect bites. Reports a little chest pain and back pain. Denies cough, SOB, diarrhea, dysuria, hematuria, paresthesias, numbness, LOC, edema, or rash.  Meds: Current Facility-Administered Medications  Medication Dose Route Frequency Provider Last Rate Last Dose  . ampicillin (OMNIPEN) 2 g in sodium chloride 0.9 % 50 mL IVPB  2 g Intravenous 4 times per day Clinton Gallant, MD      . cefTRIAXone (ROCEPHIN) 2 g in dextrose 5 % 50 mL IVPB  2 g Intravenous Q12H Orpah Greek, MD      . dexamethasone (DECADRON) injection 10 mg  10 mg Intravenous Once Orpah Greek, MD      . lidocaine (PF)  (XYLOCAINE) 1 % injection 5 mL  5 mL Intradermal Once Mercedes Strupp Camprubi-Soms, PA-C      . lidocaine (PF) (XYLOCAINE) 1 % injection           . morphine 4 MG/ML injection 4 mg  4 mg Intravenous Once [employer], PA-C      . vancomycin (VANCOCIN) 2,000 mg in sodium chloride 0.9 % 500 mL IVPB  2,000 mg Intravenous Once Rogue Bussing, [facility]       Current Outpatient Prescriptions  Medication Sig Dispense Refill  . acetaminophen (TYLENOL) 500 MG tablet Take 2 tablets (1,000 mg total) by mouth every 8 (eight) hours as needed.  60 tablet  0  . aspirin (ANACIN) 81 MG EC tablet Take 81 mg by mouth daily.        . Biotin 5000 MCG CAPS Take 10,000 mcg by mouth every morning.      . Chlorphen-Phenyleph-ASA (ALKA-SELTZER PLUS COLD) 2-7.8-325 MG TBEF Take 1 tablet by mouth daily as needed (for cold).      . chlorthalidone (HYGROTON) 25 MG tablet Take 0.5 tablets (12.5 mg total) by mouth daily.  30 tablet  1  . citalopram (CELEXA) 20 MG tablet Take 1 tablet (20 mg total) by mouth daily.  90 tablet  1  . ibuprofen (ADVIL,MOTRIN) 800 MG tablet Take 1 tablet (800 mg total) by mouth every 8 (eight)  hours as needed.  30 tablet  0  . losartan (COZAAR) 100 MG tablet Take 1 tablet (100 mg total) by mouth daily.  30 tablet  5  . metFORMIN (GLUCOPHAGE) 1000 MG tablet Take 1,000 mg by mouth 2 (two) times daily with a meal.      . pravastatin (PRAVACHOL) 40 MG tablet Take 1 tablet (40 mg total) by mouth daily.  90 tablet  3  . psyllium (METAMUCIL) 58.6 % packet Take 1 packet by mouth daily as needed (for fiber supplement).        Allergies: Allergies as of 06/24/2014 - Review Complete 06/24/2014  Allergen Reaction Noted  . Codeine sulfate Nausea And Vomiting and Other (See Comments)   . Penicillins Nausea And Vomiting 07/08/2007  . Percocet [oxycodone-acetaminophen] Nausea And Vomiting 06/24/2014  . Lisinopril Cough 03/29/2010   Past Medical History  Diagnosis Date  . Depression   .  Chronic anemia 08/29/2012    Need colonoscopy or report. Need iron panel.   . Dyslipidemia 06/30/2007  . Essential hypertension, benign 06/30/2007  . Type 2 diabetes mellitus, uncontrolled, with neuropathy 06/30/2007  . GERD 06/30/2007  . Insomnia 06/11/2012  . Peripheral neuropathy 12/28/2009  . Obesity, BMI 35-40 06/11/2012   Past Surgical History  Procedure Laterality Date  . Tubal ligation    . Colonoscopy w/ polypectomy  9/05   Family History  Problem Relation Age of Onset  . Dementia Mother    History   Social History  . Marital Status: Single    Spouse Name: N/A    Number of Children: N/A  . Years of Education: 12   Occupational History  . SALES    Social History Main Topics  . Smoking status: Former Smoker    Quit date: 10/28/1978  . Smokeless tobacco: Not on file  . Alcohol Use: No  . Drug Use: No  . Sexual Activity: Yes    Birth Control/ Protection: Condom   Other Topics Concern  . Not on file   Social History Narrative   She is still care taking her elderly mother who has dementia and works part time at [employer].  Currently no health insurance.            Financial assistance application completed. Patient does not qualify for assistance - over income.   Per Bonna Gains 03/29/2010    Review of Systems: Constitutional: +subjective fevers/chills Eyes: +spots in vision, +photophobia, no blurred vision Ears, nose, mouth, throat, and face: no cough Respiratory: no shortness of breath Cardiovascular: +mild chest pain Gastrointestinal: +nausea/vomiting, no abdominal pain, no constipation, no diarrhea Genitourinary: no dysuria, no hematuria Integument: no rash Hematologic/lymphatic: no bleeding/bruising, no edema Musculoskeletal: no arthralgias, no myalgias Neurological: no paresthesias, no weakness   Physical Exam: Blood pressure 140/69, pulse 89, temperature 100.3 F (37.9 C), temperature source Rectal, resp. rate 28, height 5\' 3"  (1.6 m), weight 210 lb (95.255  kg), SpO2 92.00%. General Apperance: NAD Head: Normocephalic, atraumatic Eyes: PERRL, EOMI, anicteric sclera Nose: Nares normal, septum midline, mucosa normal Throat: Lips, mucosa and tongue normal  Neck: Trachea midline. TTP but no bony tenderness. Active flexion of neck limited by pain and stiffness. Back: No tenderness or bony abnormality  Lungs: Clear to auscultation bilaterally. No wheezes, rhonchi or rales. Breathing comfortably Chest Wall: Nontender, no deformity Heart: Regular rate and rhythm, no murmur/rub/gallop Abdomen: Soft, nontender, nondistended, no rebound/guarding Extremities: Normal, atraumatic, warm and well perfused, no edema Pulses: 2+ throughout Skin: No rashes or lesions Neurologic: Alert  and oriented x 3. CNII-XII intact. Normal strength and sensation   Lab results: Basic Metabolic Panel:  Recent Labs  06/24/14 1711  NA 137  K 3.6*  CL 94*  CO2 30  GLUCOSE 174*  BUN 12  CREATININE 0.71  CALCIUM 9.7   CBC:  Recent Labs  06/24/14 1711  WBC 11.3*  NEUTROABS 8.5*  HGB 11.3*  HCT 34.7*  MCV 77.5*  PLT 367  Relative neutrophils 76 Absolute neutrophils 8.5 No bands  Urinalysis:  Recent Labs  06/24/14 2220  COLORURINE YELLOW  LABSPEC 1.019  PHURINE 6.5  GLUCOSEU NEGATIVE  HGBUR NEGATIVE  BILIRUBINUR NEGATIVE  KETONESUR NEGATIVE  PROTEINUR 30*  UROBILINOGEN 1.0  NITRITE NEGATIVE  LEUKOCYTESUR NEGATIVE   Misc. Labs: Lactic acid 2.3 Troponin POC 0.00 Glucose, CSF 90 Total protein, CSF 105 Preliminary WBC, CSF 560  Imaging results:  Ct Head Wo Contrast  06/24/2014   CLINICAL DATA:  Headache dizziness and neck pain with increasing frequency.  EXAM: CT HEAD WITHOUT CONTRAST  TECHNIQUE: Contiguous axial images were obtained from the base of the skull through the vertex without intravenous contrast.  COMPARISON:  None.  FINDINGS: No acute cortical infarct, hemorrhage, or mass lesion is present. The ventricles are of normal size. No  significant extra-axial fluid collection is evident. The paranasal sinuses and mastoid air cells are clear. The osseous skull is intact.  IMPRESSION: Negative CT of the head.   Electronically Signed   By: Lawrence Santiago M.D.   On: 06/24/2014 23:08    Other results: EKG: Sinus rhythm, prolonged PR (225ms), otherwise unchanged from previous EKG  Assessment & Plan by Problem: Principal Problem:   Meningitis Active Problems:   Type 2 diabetes mellitus, uncontrolled, with neuropathy   Peripheral neuropathy   Obesity, BMI 35-40   Chronic anemia  Headache: Differential includes meningitis, other intracranial infections, subarachnoid hemorrhage, acute angle-closure glaucoma, and hypertensive emergencies, intracranial mass. Negative CT of the head make subarachnoid hemorrhage, intracranial mass, intracranial abscess less likely. Pt denied decreased vision and she has no conjunctival redness making acute angle closure glaucoma less likely. Normotensive making hypertensive emergency less likely. She meets SIRS criteria with tachypnea (RR 20) and tachycardia (HR 93). LP was performed by ED. Opening pressure was noted to be slightly elevated at 25. She was given 10mg  dexamethasone IV, 2g ceftriaxone, 2g vancomycin, 2g ampicillin. -Continue empiric antibiotics: 2g ceftriaxone BID, 2g vancomycin daily, 2g ampicillin QID -follow up blood cultures -follow up HIV antibody -follow up CSF culture, gram stain, cell count w/ diff, HSV -admit to step down unit with seizure precautions and neuro checks  Mild chest pain: Initial troponin negative and EKG without acute ischemic changes. -CXR -Repeat EKG in AM -Hgb A1c, TSH  HTN -continue chlorthalidone 12.5mg  daily -continue losartan 100mg  daily  DM2 with peripheral neuropathy: last hgb A1c 7.8 02/02/2014 -SSI, Accuchecks Q4hr -hold home metformin 1000mg  BID  Depression -continue citalopram 20mg  daily  FEN -NPO -NS@75ml /hr  DVT ppx: subq hep 5000 u  TID  Dispo: Disposition is deferred at this time, awaiting improvement of current medical problems. Anticipated discharge in approximately 1 day(s).   The patient does have a current PCP Francesca Oman, DO) and does need an [facility] hospital follow-up appointment after discharge.  The patient does not know have transportation limitations that hinder transportation to clinic appointments.  Signed: Jacques Earthly, MD 06/25/2014, 12:57 AM

## 2014-06-25 NOTE — ED Provider Notes (Signed)
Patient presented to the ER with headache, neck pain, neck stiffness, fever. Patient reports a previous history of meningitis with similar symptoms.  Face to face Exam: HEENT - PERRLA Lungs - CTAB Heart - RRR, no M/R/G Abd - S/NT/ND Neuro - alert, oriented x3 Musculoskeletal - diffuse stiffness and tenderness of the neck without overt Brudzinski or Kernig sign  Plan: She will fever, headache, neck pain, neck stiffness and a previous history of meningitis with similar symptoms. Lumbar puncture, empiric antibiotic coverage awaiting culture.   PROCEDURE: Lumbar Puncture The patient was placed in the left lateral decubitus with help from the nursing staff. The area was cleansed and draped in usual sterile fashion. Anesthesia was achieved with 1% lidocaine. A 22-gauge 3.5-inch spinal needle was placed in the L4-L5 interspace. Multiple attempts were necessary due to the patient's body habitus. This was a difficult procedure. Ultimately, CSF was obtained but it was a bloody tap. Opening pressure was slightly elevated at 25. Four tubes were filled with 4 mL of CSF. These were sent for the usual tests, including 1 tube to be held for further analysis if needed. The patient had no immediate complications and tolerated the procedure well.    Orpah Greek, MD 06/25/14 0100

## 2014-06-26 ENCOUNTER — Inpatient Hospital Stay (HOSPITAL_COMMUNITY): Payer: Self-pay

## 2014-06-26 LAB — GLUCOSE, CAPILLARY
GLUCOSE-CAPILLARY: 135 mg/dL — AB (ref 70–99)
Glucose-Capillary: 177 mg/dL — ABNORMAL HIGH (ref 70–99)
Glucose-Capillary: 130 mg/dL — ABNORMAL HIGH (ref 70–99)
Glucose-Capillary: 156 mg/dL — ABNORMAL HIGH (ref 70–99)

## 2014-06-26 LAB — URINE CULTURE: SPECIAL REQUESTS: NORMAL

## 2014-06-26 LAB — HERPES SIMPLEX VIRUS(HSV) DNA BY PCR
HSV 1 DNA: NOT DETECTED
HSV 2 DNA: DETECTED

## 2014-06-26 MED ORDER — ONDANSETRON HCL 4 MG/2ML IJ SOLN
4.0000 mg | Freq: Once | INTRAMUSCULAR | Status: AC
  Administered 2014-06-26: 4 mg via INTRAVENOUS
  Filled 2014-06-26: qty 2

## 2014-06-26 MED ORDER — DIPHENHYDRAMINE HCL 25 MG PO CAPS
25.0000 mg | ORAL_CAPSULE | Freq: Once | ORAL | Status: AC
  Administered 2014-06-26: 25 mg via ORAL
  Filled 2014-06-26: qty 1

## 2014-06-26 MED ORDER — IOHEXOL 350 MG/ML SOLN
50.0000 mL | Freq: Once | INTRAVENOUS | Status: AC | PRN
  Administered 2014-06-26: 50 mL via INTRAVENOUS

## 2014-06-26 MED ORDER — SODIUM CHLORIDE 0.9 % IJ SOLN: 10.0000 mL | INTRAMUSCULAR | Status: DC | PRN

## 2014-06-26 MED ORDER — PROCHLORPERAZINE EDISYLATE 5 MG/ML IJ SOLN
5.0000 mg | Freq: Once | INTRAMUSCULAR | Status: AC
  Administered 2014-06-26: 5 mg via INTRAVENOUS
  Filled 2014-06-26: qty 1

## 2014-06-26 MED ORDER — SODIUM CHLORIDE 0.9 % IJ SOLN
10.0000 mL | Freq: Two times a day (BID) | INTRAMUSCULAR | Status: DC
  Administered 2014-06-26 – 2014-06-30 (×5): 10 mL
  Filled 2014-06-26: qty 10

## 2014-06-26 NOTE — ED Provider Notes (Signed)
Medical screening examination/treatment/procedure(s) were conducted as a shared visit with non-physician practitioner(s) and myself.  I personally evaluated the patient during the encounter.  Please see separate associated note for evaluation and plan.    EKG Interpretation   Date/Time:  Friday June 24 2014 22:10:57 EDT Ventricular Rate:  90 PR Interval:  266 QRS Duration: 89 QT Interval:  365 QTC Calculation: 447 R Axis:   75 Text Interpretation:  Sinus rhythm Prolonged PR interval RAE, consider  biatrial enlargement LVH with secondary repolarization abnormality ED  PHYSICIAN INTERPRETATION AVAILABLE IN [place] Confirmed by TEST,  Record ([id number]) on 06/26/2014 8:45:18 AM       Orpah Greek, MD 06/26/14 1530

## 2014-06-26 NOTE — Progress Notes (Addendum)
Subjective:  Remains afebrile overnight. Having lot of frontal headache. No n/v/diarrhea.   Objective: Vital signs in last 24 hours: Filed Vitals:   06/25/14 1619 06/25/14 2030 06/26/14 0026 06/26/14 0523  BP: 124/44 133/51 130/54 141/63  Pulse:  71 73 65  Temp: 99.4 F (37.4 C) 97.3 F (36.3 C) 98.6 F (37 C) 98.2 F (36.8 C)  TempSrc: Oral Oral Oral Oral  Resp: 25 22 24 19   Height:      Weight:      SpO2: 100% 100% 100% 100%   Weight change:   Intake/Output Summary (Last 24 hours) at 06/26/14 0804 Last data filed at 06/26/14 0600  Gross per 24 hour  Intake   2032 ml  Output   1400 ml  Net    632 ml   Vitals reviewed. General: resting in bed, in NAD HEENT: PERRL, EOMI, no scleral icterus Cardiac: RRR, no rubs, murmurs or gallops Pulm: clear to auscultation bilaterally, no wheezes, rales, or rhonchi Abd: soft, nontender, nondistended, BS present Ext: warm and well perfused, no pedal edema, no rashes noted Neuro: alert and oriented X3, strength 5/5 in bilateral LE and UE, sensation to light touch equal in bilateral upper and lower extremities.   Lab Results: Basic Metabolic Panel:  Recent Labs Lab 06/24/14 1711 06/25/14 0550  NA 137 136*  K 3.6* 3.4*  CL 94* 94*  CO2 30 28  GLUCOSE 174* 235*  BUN 12 12  CREATININE 0.71 0.64  CALCIUM 9.7 8.9   Liver Function Tests:  Recent Labs Lab 06/25/14 0550  AST 16  ALT 20  ALKPHOS 103  BILITOT 0.3  PROT 7.3  ALBUMIN 3.7   CBC:  Recent Labs Lab 06/24/14 1711 06/25/14 0550  WBC 11.3* 12.1*  NEUTROABS 8.5*  --   HGB 11.3* 10.3*  HCT 34.7* 32.0*  MCV 77.5* 77.5*  PLT 367 348   CBG:  Recent Labs Lab 06/25/14 0319 06/25/14 0722 06/25/14 1114 06/25/14 2132  GLUCAP 186* 242* 289* 185*   Thyroid Function Tests:  Recent Labs Lab 06/25/14 0550  TSH 0.850   Urinalysis:  Recent Labs Lab 06/24/14 2220  COLORURINE YELLOW  LABSPEC 1.019  PHURINE 6.5  GLUCOSEU NEGATIVE  HGBUR NEGATIVE    BILIRUBINUR NEGATIVE  KETONESUR NEGATIVE  PROTEINUR 30*  UROBILINOGEN 1.0  NITRITE NEGATIVE  LEUKOCYTESUR NEGATIVE    Micro Results: Recent Results (from the past 240 hour(s))  URINE CULTURE     Status: None   Collection Time    06/24/14 10:20 PM      Result Value Ref Range Status   Specimen Description URINE, CLEAN CATCH   Final   Special Requests Normal   Final   Culture  Setup Time     Final   Value: 06/25/2014 04:44     Performed at [place]     Final   Value: 40,000 COLONIES/ML     Performed at [facility]   Culture     Final   Value: Multiple bacterial morphotypes present, none predominant. Suggest appropriate recollection if clinically indicated.     Performed at [facility]   Report Status 06/26/2014 FINAL   Final  CSF CULTURE     Status: None   Collection Time    06/25/14 12:32 AM      Result Value Ref Range Status   Specimen Description CSF   Final   Special Requests TUBE 2 0.5CC   Final  Gram Stain     Final   Value: CYTOSPIN WBC PRESENT, PREDOMINANTLY MONONUCLEAR     NO ORGANISMS SEEN     Gram Stain Report Called to,Read Back By and Verified With: Gram Stain Report Called to,Read Back By and Verified With: M WHITE RN [id number] [facility]     Performed at [facility]   Culture PENDING   Incomplete   Report Status PENDING   Incomplete  GRAM STAIN     Status: None   Collection Time    06/25/14 12:32 AM      Result Value Ref Range Status   Specimen Description CSF   Final   Special Requests NONE   Final   Gram Stain     Final   Value: CYTOSPUN     NO ORGANISMS SEEN     WBC PRESENT, PREDOMINANTLY MONONUCLEAR     Results Called to: Henrine Screws [id number] [place]   Report Status 06/25/2014 FINAL   Final  MRSA PCR SCREENING     Status: None   Collection Time    06/25/14  3:27 AM      Result Value Ref Range Status   MRSA by PCR NEGATIVE  NEGATIVE Final   Comment:            The GeneXpert MRSA Assay  (FDA     approved for NASAL specimens     only), is one component of a     comprehensive MRSA colonization     surveillance program. It is not     intended to diagnose MRSA     infection nor to guide or     monitor treatment for     MRSA infections.   Studies/Results: Ct Head Wo Contrast  06/24/2014   CLINICAL DATA:  Headache dizziness and neck pain with increasing frequency.  EXAM: CT HEAD WITHOUT CONTRAST  TECHNIQUE: Contiguous axial images were obtained from the base of the skull through the vertex without intravenous contrast.  COMPARISON:  None.  FINDINGS: No acute cortical infarct, hemorrhage, or mass lesion is present. The ventricles are of normal size. No significant extra-axial fluid collection is evident. The paranasal sinuses and mastoid air cells are clear. The osseous skull is intact.  IMPRESSION: Negative CT of the head.   Electronically Signed   By: Lawrence Santiago M.D.   On: 06/24/2014 23:08   Portable Chest 1 View  06/25/2014   CLINICAL DATA:  Meningitis  EXAM: PORTABLE CHEST - 1 VIEW  COMPARISON:  08/18/2012  FINDINGS: Cardiac shadow is prominent but accentuated by the portable technique. The overall inspiratory effort is poor with vascular crowding. A mild degree of vascular congestion is noted. No focal infiltrate is seen. No bony abnormality is noted.  IMPRESSION: Poor inspiratory effort.  Mild vascular congestion is noted.   Electronically Signed   By: Inez Catalina M.D.   On: 06/25/2014 07:20   Medications: I have reviewed the patient's current medications. Scheduled Meds: . acyclovir  600 mg Intravenous 3 times per day  . chlorthalidone  12.5 mg Oral Daily  . citalopram  20 mg Oral Daily  . insulin aspart  0-15 Units Subcutaneous TID WC  . sodium chloride  3 mL Intravenous Q12H   Continuous Infusions: . sodium chloride 75 mL/hr at 06/25/14 2030   PRN Meds:.acetaminophen, morphine injection, ondansetron (ZOFRAN) IV, ondansetron, traMADol Assessment/Plan: 63 yo woman  with PMH of bacterial meningitis almost 20 yrs ago that was successfully treated with PO Abx, presenting  with neck stiffness, severe headache with sudden onset 2 days prior to presentation  Sudden onset headache: She presented with neck stiffness, severe headache with sudden onset 2 days prior to presentation, photophobia and bilateral LE weakness. 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 She had LP in ED with CSF showing 1020 RBC, 420 WBC, Protein of 105, glucose 90, predominant lymphocytes, and no organisms, concerning for viral meningitis. SAH is also in the differential though her CT head is negative for acute bleed. Her headache has improved slightly with Tylenol but she has persistent photophobia and neck stiffness.  -Discontinued IV vanc, ampicillin, and ceftriaxone which she received in the ED for possible bacterial meningitis -continuing acyclovir IV for possible viral meningitis as it's not ruled out yet -f/u CSF HSV PCR, culture -Ordered CT angio for further eval of SAH and possible brain aneurysm. Was not done because of IV line issues, now has PIV but needs power PICC for CTA brain. -Frequent Neuro checks -Continue seizure precautions -Will formally consult Neurology pending results of CTA -Avoid anticoagulation for VTE prophylaxis -Avoid Percocet as she gets nauseas. Trying 1x zofran+compazine headache cocktail -Tramadol 50mg  q8 PRN for pain -Zofran PRN for nausea  HTN: Well controlled.  -continue home chlorthalidone 12.5mg  daily  -Hold losartan 100mg  daily in setting of IV contrast for CTA angio per above  DM2: with peripheral neuropathy: last hgb A1c 7.8 02/02/2014  -SSI, Accuchecks Q4hr  -hold home metformin 1000mg  BID   Depression  -continue citalopram 20mg  daily   FEN  -Carb mod diet -NS@75ml /hr   DVT ppx: SCDs  Dispo: Disposition is deferred at this time, awaiting improvement of  current medical problems.  Anticipated discharge in approximately 1-2 day(s).   The patient does have a current PCP Francesca Oman, DO) and does need an [facility] hospital follow-up appointment after discharge.  The patient does not have transportation limitations that hinder transportation to clinic appointments.  .Services Needed at time of discharge: Y = Yes, Blank = No PT:   OT:   RN:   Equipment:   Other:     LOS: 2 days   Dellia Nims, MD 06/26/2014, 8:04 AM

## 2014-06-26 NOTE — Progress Notes (Addendum)
06-26-14   1130  IV Team Note;   Pt has order to place a picc line for CT scan; she does have peripheral access in her left hand;  Explained risks/benefits of picc placement; pt declined to sign consent until further discussion with family;  RN aware;  Raynelle Fanning RN  IV Team

## 2014-06-26 NOTE — Progress Notes (Signed)
Peripherally Inserted Central Catheter/Midline Placement  The IV Nurse has discussed with the patient and/or persons authorized to consent for the patient, the purpose of this procedure and the potential benefits and risks involved with this procedure.  The benefits include less needle sticks, lab draws from the catheter and patient may be discharged home with the catheter.  Risks include, but not limited to, infection, bleeding, blood clot (thrombus formation), and puncture of an artery; nerve damage and irregular heat beat.  Alternatives to this procedure were also discussed.  PICC/Midline Placement Documentation  PICC / Midline Single Lumen 06/26/14 PICC Right Cephalic 38 cm 2 cm (Active)  Indication for Insertion or Continuance of Line Poor Vasculature-patient has had multiple peripheral attempts or PIVs lasting less than 24 hours 06/26/2014  5:37 PM  Exposed Catheter (cm) 2 cm 06/26/2014  5:37 PM  Site Assessment Clean;Dry;Intact 06/26/2014  5:37 PM  Line Status Flushed;Saline locked;Blood return noted 06/26/2014  5:37 PM  Dressing Type Transparent 06/26/2014  5:37 PM  Dressing Status Clean;Dry;Intact;Antimicrobial disc in place 06/26/2014  5:37 PM  Line Care Connections checked and tightened 06/26/2014  5:37 PM  Line Adjustment (NICU/IV Team Only) No 06/26/2014  5:37 PM  Dressing Intervention New dressing 06/26/2014  5:37 PM  Dressing Change Due 07/03/14 06/26/2014  5:37 PM       Rolena Infante 06/26/2014, 5:38 PM

## 2014-06-26 NOTE — Progress Notes (Signed)
ANTIBIOTIC CONSULT NOTE - F/u  Pharmacy Consult for acyclovir Indication: r/o viral meningitis  Allergies  Allergen Reactions  . Codeine Sulfate Nausea And Vomiting and Other (See Comments)    Dizziness  . Penicillins Nausea And Vomiting  . Percocet [Oxycodone-Acetaminophen] Nausea And Vomiting  . Lisinopril Cough    Patient Measurements: Height: 5\' 3"  (160 cm) Weight: 226 lb 3.1 oz (102.6 kg) IBW/kg (Calculated) : 52.4  Vital Signs: Temp: 98.7 F (37.1 C) (08/30 0700) Temp src: Oral (08/30 0700) BP: 141/63 mmHg (08/30 0523) Pulse Rate: 65 (08/30 0523) Intake/Output from previous day: 08/29 0701 - 08/30 0700 In: 2032 [P.O.:120; I.V.:1800; IV Piggyback:112] Out: 1400 [Urine:1400]  Labs:  Recent Labs  06/24/14 1711 06/25/14 0550  WBC 11.3* 12.1*  HGB 11.3* 10.3*  PLT 367 348  CREATININE 0.71 0.64   Estimated Creatinine Clearance: 84.5 ml/min (by C-G formula based on Cr of 0.64). No results found for this basename: VANCOTROUGH, Corlis Leak, VANCORANDOM, GENTTROUGH, GENTPEAK, GENTRANDOM, TOBRATROUGH, TOBRAPEAK, TOBRARND, AMIKACINPEAK, AMIKACINTROU, AMIKACIN,  in the last 72 hours   Microbiology: Recent Results (from the past 720 hour(s))  URINE CULTURE     Status: None   Collection Time    06/24/14 10:20 PM      Result Value Ref Range Status   Specimen Description URINE, CLEAN CATCH   Final   Special Requests Normal   Final   Culture  Setup Time     Final   Value: 06/25/2014 04:44     Performed at [place]     Final   Value: 40,000 COLONIES/ML     Performed at [facility]   Culture     Final   Value: Multiple bacterial morphotypes present, none predominant. Suggest appropriate recollection if clinically indicated.     Performed at [facility]   Report Status 06/26/2014 FINAL   Final  CSF CULTURE     Status: None   Collection Time    06/25/14 12:32 AM      Result Value Ref Range Status   Specimen Description CSF    Final   Special Requests TUBE 2 0.5CC   Final   Gram Stain     Final   Value: CYTOSPIN WBC PRESENT, PREDOMINANTLY MONONUCLEAR     NO ORGANISMS SEEN     Gram Stain Report Called to,Read Back By and Verified With: Gram Stain Report Called to,Read Back By and Verified With: M WHITE RN [id number] [facility]     Performed at [place]   Culture PENDING   Incomplete   Report Status PENDING   Incomplete  GRAM STAIN     Status: None   Collection Time    06/25/14 12:32 AM      Result Value Ref Range Status   Specimen Description CSF   Final   Special Requests NONE   Final   Gram Stain     Final   Value: CYTOSPUN     NO ORGANISMS SEEN     WBC PRESENT, PREDOMINANTLY MONONUCLEAR     Results Called to: Henrine Screws [id number] [place]   Report Status 06/25/2014 FINAL   Final  MRSA PCR SCREENING     Status: None   Collection Time    06/25/14  3:27 AM      Result Value Ref Range Status   MRSA by PCR NEGATIVE  NEGATIVE Final   Comment:  The GeneXpert MRSA Assay (FDA     approved for NASAL specimens     only), is one component of a     comprehensive MRSA colonization     surveillance program. It is not     intended to diagnose MRSA     infection nor to guide or     monitor treatment for     MRSA infections.   Assessment: 66 yof continuing on acyclovir for r/o HSV meningitis. CSF revealed xanthochromia, high RBC count. All other antibiotics stopped. HA/neck stiffness improved. Afeb, wbc slightly elevated 12.1. SCr wnl, UOP ok. Patient on NaCl IVF@75ml /hour.  Cx neg to date.  8/29 vanc x1 in ED 8/29 ampicillin>>8/29 8/29 rocephin>>8/29 8/29 acyclovir>>  8/28 UC>>ng 8/29 CSF>>G stain-no orgs seen 8/29 BCx2>>  Goal of Therapy:  Eradication of infection  Plan:  -Acyclovir 600mg  IV q8h -F/u c/sx, clinical progress, renal funct, abx dot  Elicia Lamp, PharmD Clinical Pharmacist - Resident Pager [phone] 06/26/2014 9:47 AM

## 2014-06-27 ENCOUNTER — Inpatient Hospital Stay (HOSPITAL_COMMUNITY): Payer: Self-pay

## 2014-06-27 LAB — BASIC METABOLIC PANEL
ANION GAP: 9 (ref 5–15)
ANION GAP: 11 (ref 5–15)
BUN: 10 mg/dL (ref 6–23)
BUN: 9 mg/dL (ref 6–23)
CO2: 35 meq/L — AB (ref 19–32)
CO2: 35 meq/L — AB (ref 19–32)
Calcium: 8.6 mg/dL (ref 8.4–10.5)
Calcium: 8.8 mg/dL (ref 8.4–10.5)
Chloride: 88 mEq/L — ABNORMAL LOW (ref 96–112)
Chloride: 86 mEq/L — ABNORMAL LOW (ref 96–112)
Creatinine, Ser: 0.64 mg/dL (ref 0.50–1.10)
Creatinine, Ser: 0.61 mg/dL (ref 0.50–1.10)
GFR calc Af Amer: >90 mL/min (ref >90)
GFR calc non Af Amer: >90 mL/min (ref >90)
Glucose, Bld: 149 mg/dL — ABNORMAL HIGH (ref 70–99)
Glucose, Bld: 141 mg/dL — ABNORMAL HIGH (ref 70–99)
POTASSIUM: 3.1 meq/L — AB (ref 3.7–5.3)
Potassium: 2.9 mEq/L — CL (ref 3.7–5.3)
SODIUM: 132 meq/L — AB (ref 137–147)
SODIUM: 132 meq/L — AB (ref 137–147)

## 2014-06-27 LAB — URINALYSIS, ROUTINE W REFLEX MICROSCOPIC
Bilirubin Urine: NEGATIVE
GLUCOSE, UA: NEGATIVE mg/dL
Hgb urine dipstick: NEGATIVE
Ketones, ur: NEGATIVE mg/dL
LEUKOCYTES UA: NEGATIVE
Nitrite: NEGATIVE
PH: 6 (ref 5.0–8.0)
Protein, ur: NEGATIVE mg/dL
Specific Gravity, Urine: 1.017 (ref 1.005–1.030)
Urobilinogen, UA: 0.2 mg/dL (ref 0.0–1.0)

## 2014-06-27 LAB — CBC WITH DIFFERENTIAL/PLATELET
BASOS ABS: 0 10*3/uL (ref 0.0–0.1)
Basophils Relative: 0 % (ref 0–1)
EOS PCT: 1 % (ref 0–5)
Eosinophils Absolute: 0.1 10*3/uL (ref 0.0–0.7)
HEMATOCRIT: 31 % — AB (ref 36.0–46.0)
Hemoglobin: 10 g/dL — ABNORMAL LOW (ref 12.0–15.0)
LYMPHS PCT: 14 % (ref 12–46)
Lymphs Abs: 1.9 10*3/uL (ref 0.7–4.0)
MCH: 25 pg — ABNORMAL LOW (ref 26.0–34.0)
MCHC: 32.3 g/dL (ref 30.0–36.0)
MCV: 77.5 fL — ABNORMAL LOW (ref 78.0–100.0)
Monocytes Absolute: 1.3 10*3/uL — ABNORMAL HIGH (ref 0.1–1.0)
Monocytes Relative: 10 % (ref 3–12)
NEUTROS ABS: 10.2 10*3/uL — AB (ref 1.7–7.7)
Neutrophils Relative %: 75 % (ref 43–77)
PLATELETS: 329 10*3/uL (ref 150–400)
RBC: 4 MIL/uL (ref 3.87–5.11)
RDW: 15.3 % (ref 11.5–15.5)
WBC: 13.6 10*3/uL — AB (ref 4.0–10.5)

## 2014-06-27 LAB — GLUCOSE, CAPILLARY
GLUCOSE-CAPILLARY: 174 mg/dL — AB (ref 70–99)
GLUCOSE-CAPILLARY: 171 mg/dL — AB (ref 70–99)
GLUCOSE-CAPILLARY: 163 mg/dL — AB (ref 70–99)
Glucose-Capillary: 169 mg/dL — ABNORMAL HIGH (ref 70–99)

## 2014-06-27 LAB — PATHOLOGIST SMEAR REVIEW

## 2014-06-27 MED ORDER — POTASSIUM CHLORIDE CRYS ER 20 MEQ PO TBCR
40.0000 meq | EXTENDED_RELEASE_TABLET | Freq: Once | ORAL | Status: AC
  Administered 2014-06-27: 40 meq via ORAL
  Filled 2014-06-27: qty 2

## 2014-06-27 MED ORDER — DEXTROSE 5 % IV SOLN
600.0000 mg | Freq: Three times a day (TID) | INTRAVENOUS | Status: DC
  Administered 2014-06-27 – 2014-06-30 (×9): 600 mg via INTRAVENOUS
  Filled 2014-06-27 (×10): qty 12

## 2014-06-27 NOTE — Progress Notes (Addendum)
Subjective:  Low grade fever overnight, tmax 100.6.  Having lot of frontal headache and light sensitivity. No n/v/diarrhea.   Objective: Vital signs in last 24 hours: Filed Vitals:   06/27/14 0515 06/27/14 0836 06/27/14 0845 06/27/14 1150  BP: 153/60 152/61    Pulse: 88     Temp: 100.1 F (37.8 C)  100.6 F (38.1 C) 101.6 F (38.7 C)  TempSrc: Oral   Oral  Resp: 28 29    Height:      Weight:      SpO2: 100%      Weight change:   Intake/Output Summary (Last 24 hours) at 06/27/14 1208 Last data filed at 06/27/14 0865  Gross per 24 hour  Intake   1695 ml  Output   1801 ml  Net   -106 ml   Vitals reviewed. General: resting in bed, distressed from pain. HEENT: PERRL, EOMI, no scleral icterus Cardiac: RRR, no rubs, murmurs or gallops Pulm: clear to auscultation bilaterally, no wheezes, rales, or rhonchi Abd: soft, nontender, nondistended, BS present Ext: warm and well perfused, no pedal edema, no rashes noted Neuro: alert and oriented X3, strength 5/5 in bilateral LE and UE, sensation to light touch equal in bilateral upper and lower extremities.   Lab Results: Basic Metabolic Panel:  Recent Labs Lab 06/24/14 1711 06/25/14 0550  NA 137 136*  K 3.6* 3.4*  CL 94* 94*  CO2 30 28  GLUCOSE 174* 235*  BUN 12 12  CREATININE 0.71 0.64  CALCIUM 9.7 8.9   Liver Function Tests:  Recent Labs Lab 06/25/14 0550  AST 16  ALT 20  ALKPHOS 103  BILITOT 0.3  PROT 7.3  ALBUMIN 3.7   CBC:  Recent Labs Lab 06/24/14 1711 06/25/14 0550  WBC 11.3* 12.1*  NEUTROABS 8.5*  --   HGB 11.3* 10.3*  HCT 34.7* 32.0*  MCV 77.5* 77.5*  PLT 367 348   CBG:  Recent Labs Lab 06/26/14 0826 06/26/14 1057 06/26/14 1630 06/26/14 2141 06/27/14 0830 06/27/14 1155  GLUCAP 135* 177* 130* 156* 174* 171*   Thyroid Function Tests:  Recent Labs Lab 06/25/14 0550  TSH 0.850   Urinalysis:  Recent Labs Lab 06/24/14 2220  COLORURINE YELLOW  LABSPEC 1.019  PHURINE 6.5    GLUCOSEU NEGATIVE  HGBUR NEGATIVE  BILIRUBINUR NEGATIVE  KETONESUR NEGATIVE  PROTEINUR 30*  UROBILINOGEN 1.0  NITRITE NEGATIVE  LEUKOCYTESUR NEGATIVE    Micro Results: Recent Results (from the past 240 hour(s))  URINE CULTURE     Status: None   Collection Time    06/24/14 10:20 PM      Result Value Ref Range Status   Specimen Description URINE, CLEAN CATCH   Final   Special Requests Normal   Final   Culture  Setup Time     Final   Value: 06/25/2014 04:44     Performed at [place]     Final   Value: 40,000 COLONIES/ML     Performed at [facility]   Culture     Final   Value: Multiple bacterial morphotypes present, none predominant. Suggest appropriate recollection if clinically indicated.     Performed at [facility]   Report Status 06/26/2014 FINAL   Final  CSF CULTURE     Status: None   Collection Time    06/25/14 12:32 AM      Result Value Ref Range Status   Specimen Description CSF   Final  Special Requests TUBE 2 0.5CC   Final   Gram Stain     Final   Value: CYTOSPIN WBC PRESENT, PREDOMINANTLY MONONUCLEAR     NO ORGANISMS SEEN     Gram Stain Report Called to,Read Back By and Verified With: Gram Stain Report Called to,Read Back By and Verified With: M WHITE RN [id number] [facility]     Performed at [facility]   Culture     Final   Value: NO GROWTH 2 DAYS     Performed at [facility]   Report Status PENDING   Incomplete  GRAM STAIN     Status: None   Collection Time    06/25/14 12:32 AM      Result Value Ref Range Status   Specimen Description CSF   Final   Special Requests NONE   Final   Gram Stain     Final   Value: CYTOSPUN     NO ORGANISMS SEEN     WBC PRESENT, PREDOMINANTLY MONONUCLEAR     Results Called to: Henrine Screws [id number] [place]   Report Status 06/25/2014 FINAL   Final  CULTURE, BLOOD (ROUTINE X 2)     Status: None   Collection Time    06/25/14  1:27 AM      Result Value Ref  Range Status   Specimen Description BLOOD RIGHT HAND   Final   Special Requests BOTTLES DRAWN AEROBIC AND ANAEROBIC 10CC EACH   Final   Culture  Setup Time     Final   Value: 06/25/2014 14:02     Performed at [facility]   Culture     Final   Value:        BLOOD CULTURE RECEIVED NO GROWTH TO DATE CULTURE WILL BE HELD FOR 5 DAYS BEFORE ISSUING A FINAL NEGATIVE REPORT     Performed at [facility]   Report Status PENDING   Incomplete  CULTURE, BLOOD (ROUTINE X 2)     Status: None   Collection Time    06/25/14  1:38 AM      Result Value Ref Range Status   Specimen Description BLOOD LEFT HAND   Final   Special Requests BOTTLES DRAWN AEROBIC AND ANAEROBIC 10CC EACH   Final   Culture  Setup Time     Final   Value: 06/25/2014 14:02     Performed at [facility]   Culture     Final   Value:        BLOOD CULTURE RECEIVED NO GROWTH TO DATE CULTURE WILL BE HELD FOR 5 DAYS BEFORE ISSUING A FINAL NEGATIVE REPORT     Performed at [facility]   Report Status PENDING   Incomplete  MRSA PCR SCREENING     Status: None   Collection Time    06/25/14  3:27 AM      Result Value Ref Range Status   MRSA by PCR NEGATIVE  NEGATIVE Final   Comment:            The GeneXpert MRSA Assay (FDA     approved for NASAL specimens     only), is one component of a     comprehensive MRSA colonization     surveillance program. It is not     intended to diagnose MRSA     infection nor to guide or     monitor treatment for     MRSA infections.   Studies/Results: Ct Angio  Head W/cm &/or Wo Cm  06/26/2014   CLINICAL DATA:  Sudden onset of severe headache associated with photophobia, nausea and vomiting, and neck stiffness. Lumbar puncture showed elevated red cells and white cells. Remote history of possible meningitis several years ago. History of diabetes. Morbid obesity.  EXAM: CT ANGIOGRAPHY HEAD  TECHNIQUE: Multidetector CT imaging of the head was performed using the standard  protocol during bolus administration of intravenous contrast. Multiplanar CT image reconstructions and MIPs were obtained to evaluate the vascular anatomy.  CONTRAST:  55mL OMNIPAQUE IOHEXOL 350 MG/ML SOLN  COMPARISON:  CT head 06/24/2014 was negative.  FINDINGS: Noncontrast examination was repeated, and continues to show no evidence for subarachnoid blood. No parenchymal hemorrhage, or temporal lobe edema to suggest developing HSV encephalitis. Ventricles remain unchanged in size and normal. No sinus or mastoid fluid to suggest an extracranial source of infection.  The internal carotid arteries are widely patent. Basilar artery is widely patent with vertebrals both contributing to its formation. There is no proximal intracranial stenosis or vascular occlusion. No significant irregularity to suggest diabetic atheromatous change.  Intracranial branch points were carefully examined, and no intracranial aneurysm is seen. Major dural venous sinuses appear patent.  Post infusion imaging demonstrates no abnormal enhancement of the brain or visible meninges.  The sella turcica is abnormal. There is expansion of the normal bony confines, with thinning of the dorsum sellae, and erosion of the floor, greater on the RIGHT. The sella is filled with increased amounts of cerebrospinal fluid, and the pituitary gland is flattened. On coronal images 75 and 76 series 6010, and sagittal image 75 of series 6011, there is apparent dehiscence of the bone separating the pituitary gland from the sphenoid sinus. While there is no air-fluid level in the sphenoid sinus, and no history of rhinorrhea, the findings are consistent with empty sella, which has rarely been associated with episodes of spontaneous meningitis.  Review of the MIP images confirms the above findings.  IMPRESSION: Unremarkable CT of the brain without and with contrast. No evidence for subarachnoid or parenchymal hemorrhage. No evidence for developing encephalitis.  Empty  sella with expansion as described above. Apparent dehiscence of the right-sided sellar floor could allow communication of the intracranial compartment with the sphenoid sinus.  CT angiography demonstrates no intracranial aneurysm or significant vascular irregularity.   Electronically Signed   By: Rolla Flatten M.D.   On: 06/26/2014 20:53   Medications: I have reviewed the patient's current medications. Scheduled Meds: . acyclovir  600 mg Intravenous 3 times per day  . chlorthalidone  12.5 mg Oral Daily  . citalopram  20 mg Oral Daily  . insulin aspart  0-15 Units Subcutaneous TID WC  . sodium chloride  10-40 mL Intracatheter Q12H  . sodium chloride  3 mL Intravenous Q12H   Continuous Infusions: . sodium chloride 75 mL/hr (06/27/14 0852)   PRN Meds:.acetaminophen, morphine injection, ondansetron (ZOFRAN) IV, ondansetron, sodium chloride, traMADol Assessment/Plan: 63 yo woman with PMH of bacterial meningitis almost 20 yrs ago that was successfully treated with PO Abx, presenting with neck stiffness, severe headache with sudden onset 2 days prior to presentation  Sudden onset headache: presented with neck stiffness, severe headache, photophobia and bilateral LE weakness. Had LP in ED with CSF showing 1020 RBC, 420 WBC, Protein of 105, glucose 90, predominant lymphocytes, and no organisms, concerning for viral meningitis. SAH is also in the differential though her CT head is negative for acute bleed. CT angio was also done which shows  no bleed. LP finding is likely from traumatic tap.  Discontinued IV vanc, ampicillin, and ceftriaxone because ruled out bacterial cause.  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  - consulted ID, acyclovir IV for HSV-2 meningitis until headache improves, then switch to PO valcyclovir for total of 14 days. -Frequent Neuro checks -Continue seizure precautions -Avoid anticoagulation for VTE prophylaxis -Tramadol 50mg  q8 PRN for pain, also tylenol PRN headache. - has morphine  2mg  IV PRN but that doesn't seem to help headache. She can use for back pain from LP. -Zofran PRN for nausea - spiked a fever 101.6 this afternoon. Repeated BCX, UCX, CXR.   HTN: Well controlled.  -continue home chlorthalidone 12.5mg  daily  -Hold losartan 100mg  daily in setting of IV contras from CTA 8/30  DM2: with peripheral neuropathy: last hgb A1c 7.8 02/02/2014  -SSI, Accuchecks Q4hr  -hold home metformin 1000mg  BID   Depression  -continue citalopram 20mg  daily   FEN  -Carb mod diet -NS@75ml /hr   DVT ppx: SCDs  Dispo: Disposition is deferred at this time, awaiting improvement of current medical problems.  Anticipated discharge in approximately 1-2 day(s).   The patient does have a current PCP Francesca Oman, DO) and does need an [facility] hospital follow-up appointment after discharge.  The patient does not have transportation limitations that hinder transportation to clinic appointments.  .Services Needed at time of discharge: Y = Yes, Blank = No PT:   OT:   RN:   Equipment:   Other:     LOS: 3 days   Dellia Nims, MD 06/27/2014, 12:08 PM

## 2014-06-27 NOTE — Progress Notes (Signed)
Utilization review completed.  

## 2014-06-27 NOTE — Progress Notes (Addendum)
101.6 temperature, orally, MD made aware. No orders given, will continual monitoring.

## 2014-06-27 NOTE — Progress Notes (Signed)
CRITICAL VALUE ALERT  Critical value received: potassium 2.9  Date of notification:  06/27/2014  Time of notification:  8592  Critical value read back:Yes.    Nurse who received alert: Whitman Hero RN  MD notified (1st page):  DR. Genene Churn  Time of first page:  1349  MD notified (2nd page):  Time of second page:  Responding MD:  DR.AHMED  Time MD responded:  1356

## 2014-06-28 DIAGNOSIS — E876 Hypokalemia: Secondary | ICD-10-CM

## 2014-06-28 LAB — GLUCOSE, CAPILLARY
GLUCOSE-CAPILLARY: 148 mg/dL — AB (ref 70–99)
Glucose-Capillary: 235 mg/dL — ABNORMAL HIGH (ref 70–99)
Glucose-Capillary: 141 mg/dL — ABNORMAL HIGH (ref 70–99)
Glucose-Capillary: 193 mg/dL — ABNORMAL HIGH (ref 70–99)

## 2014-06-28 LAB — BASIC METABOLIC PANEL
Anion gap: 12 (ref 5–15)
Anion gap: 8 (ref 5–15)
BUN: 8 mg/dL (ref 6–23)
BUN: 9 mg/dL (ref 6–23)
CHLORIDE: 85 meq/L — AB (ref 96–112)
CO2: 36 meq/L — AB (ref 19–32)
CO2: 39 meq/L — AB (ref 19–32)
CREATININE: 0.57 mg/dL (ref 0.50–1.10)
CREATININE: 0.59 mg/dL (ref 0.50–1.10)
Calcium: 9 mg/dL (ref 8.4–10.5)
Calcium: 9.3 mg/dL (ref 8.4–10.5)
Chloride: 86 mEq/L — ABNORMAL LOW (ref 96–112)
GFR calc Af Amer: >90 mL/min (ref >90)
GFR calc non Af Amer: >90 mL/min (ref >90)
GFR calc non Af Amer: >90 mL/min (ref >90)
Glucose, Bld: 184 mg/dL — ABNORMAL HIGH (ref 70–99)
Glucose, Bld: 134 mg/dL — ABNORMAL HIGH (ref 70–99)
Potassium: 3.1 mEq/L — ABNORMAL LOW (ref 3.7–5.3)
Potassium: 3.1 mEq/L — ABNORMAL LOW (ref 3.7–5.3)
Sodium: 134 mEq/L — ABNORMAL LOW (ref 137–147)
Sodium: 132 mEq/L — ABNORMAL LOW (ref 137–147)

## 2014-06-28 LAB — CBC
HCT: 31 % — ABNORMAL LOW (ref 36.0–46.0)
HCT: 31.9 % — ABNORMAL LOW (ref 36.0–46.0)
HEMATOCRIT: 31.1 % — AB (ref 36.0–46.0)
HEMOGLOBIN: 10.5 g/dL — AB (ref 12.0–15.0)
Hemoglobin: 10.1 g/dL — ABNORMAL LOW (ref 12.0–15.0)
Hemoglobin: 10.1 g/dL — ABNORMAL LOW (ref 12.0–15.0)
MCH: 25.3 pg — ABNORMAL LOW (ref 26.0–34.0)
MCH: 25.6 pg — ABNORMAL LOW (ref 26.0–34.0)
MCH: 26.1 pg (ref 26.0–34.0)
MCHC: 32.6 g/dL (ref 30.0–36.0)
MCHC: 32.5 g/dL (ref 30.0–36.0)
MCHC: 32.9 g/dL (ref 30.0–36.0)
MCV: 77.7 fL — AB (ref 78.0–100.0)
MCV: 78.9 fL (ref 78.0–100.0)
MCV: 79.2 fL (ref 78.0–100.0)
PLATELETS: 346 10*3/uL (ref 150–400)
PLATELETS: 322 10*3/uL (ref 150–400)
Platelets: 304 10*3/uL (ref 150–400)
RBC: 3.99 MIL/uL (ref 3.87–5.11)
RBC: 3.94 MIL/uL (ref 3.87–5.11)
RBC: 4.03 MIL/uL (ref 3.87–5.11)
RDW: 15.4 % (ref 11.5–15.5)
RDW: 15.2 % (ref 11.5–15.5)
RDW: 15.2 % (ref 11.5–15.5)
WBC: 15.9 10*3/uL — ABNORMAL HIGH (ref 4.0–10.5)
WBC: 15.2 10*3/uL — ABNORMAL HIGH (ref 4.0–10.5)
WBC: 16.7 10*3/uL — ABNORMAL HIGH (ref 4.0–10.5)

## 2014-06-28 LAB — CSF CULTURE: Culture: NO GROWTH

## 2014-06-28 LAB — URINE CULTURE
Colony Count: NO GROWTH
Culture: NO GROWTH
Special Requests: NORMAL

## 2014-06-28 MED ORDER — LOSARTAN POTASSIUM 50 MG PO TABS
100.0000 mg | ORAL_TABLET | Freq: Every day | ORAL | Status: DC
  Administered 2014-06-29 – 2014-06-30 (×2): 100 mg via ORAL
  Filled 2014-06-28 (×2): qty 2

## 2014-06-28 MED ORDER — DOCUSATE SODIUM 100 MG PO CAPS
100.0000 mg | ORAL_CAPSULE | Freq: Every day | ORAL | Status: DC
  Administered 2014-06-28 – 2014-06-30 (×3): 100 mg via ORAL
  Filled 2014-06-28 (×3): qty 1

## 2014-06-28 MED ORDER — IBUPROFEN 400 MG PO TABS
400.0000 mg | ORAL_TABLET | Freq: Four times a day (QID) | ORAL | Status: DC | PRN
  Administered 2014-06-28 – 2014-06-30 (×4): 400 mg via ORAL
  Filled 2014-06-28 (×5): qty 1

## 2014-06-28 MED ORDER — POTASSIUM CHLORIDE CRYS ER 20 MEQ PO TBCR
40.0000 meq | EXTENDED_RELEASE_TABLET | Freq: Once | ORAL | Status: AC
  Administered 2014-06-28: 40 meq via ORAL
  Filled 2014-06-28: qty 2

## 2014-06-28 MED ORDER — POTASSIUM CHLORIDE CRYS ER 20 MEQ PO TBCR
40.0000 meq | EXTENDED_RELEASE_TABLET | Freq: Two times a day (BID) | ORAL | Status: AC
  Administered 2014-06-28 – 2014-06-29 (×2): 40 meq via ORAL
  Filled 2014-06-28: qty 2

## 2014-06-28 NOTE — Progress Notes (Signed)
Subjective:  Sherri Gill has an 8/10 frontal headache, down from >10/10 yesterday. She also reports photosensitivity and chills this morning. She has been able to get up and walk to the bathroom and feels like her lower extremity weakness is subsiding. She has had no nausea, vomiting or diarrhea.  Events: fever to 101.6 yesterday afternoon; currently 98.6. Potassium to 2.9 yesterday, given kdur, repeated at 3.1.  Objective: Vital signs in last 24 hours: Filed Vitals:   06/27/14 1958 06/27/14 2310 06/28/14 0350 06/28/14 0700  BP: 125/47 146/59 153/66   Pulse: 84 80 91   Temp: 99.5 F (37.5 C) 99.6 F (37.6 C) 99.6 F (37.6 C) 98.6 F (37 C)  TempSrc: Oral Oral Oral Oral  Resp: 30 29 27    Height:      Weight:      SpO2: 98% 96% 96%    Weight change:   Intake/Output Summary (Last 24 hours) at 06/28/14 0805 Last data filed at 06/28/14 0400  Gross per 24 hour  Intake    120 ml  Output    850 ml  Net   -730 ml   Vitals reviewed. General: resting in bed, distressed from pain. HEENT: PERRL, EOMI, no scleral icterus, slight left ptosis, but patient able to open both eyes fully on command Cardiac: RRR, no rubs, murmurs or gallops Pulm: clear to auscultation bilaterally, no wheezes, rales, or rhonchi Abd: soft, nontender, nondistended, BS present Ext: warm and well perfused, no pedal edema, no rashes noted Neuro: alert and oriented X3, strength 5/5 in bilateral LE and UE, sensation to light touch equal in bilateral upper and lower extremities.   Lab Results: Basic Metabolic Panel:  Recent Labs Lab 06/27/14 1250 06/27/14 1850  NA 132* 132*  K 2.9* 3.1*  CL 88* 86*  CO2 35* 35*  GLUCOSE 149* 141*  BUN 10 9  CREATININE 0.64 0.61  CALCIUM 8.6 8.8   Liver Function Tests:  Recent Labs Lab 06/25/14 0550  AST 16  ALT 20  ALKPHOS 103  BILITOT 0.3  PROT 7.3  ALBUMIN 3.7   CBC:  Recent Labs Lab 06/24/14 1711  06/27/14 1250 06/28/14 0446  WBC 11.3*  < > 13.6*  15.9*  NEUTROABS 8.5*  --  10.2*  --   HGB 11.3*  < > 10.0* 10.1*  HCT 34.7*  < > 31.0* 31.0*  MCV 77.5*  < > 77.5* 77.7*  PLT 367  < > 329 346  < > = values in this interval not displayed. CBG:  Recent Labs Lab 06/26/14 1630 06/26/14 2141 06/27/14 0830 06/27/14 1155 06/27/14 1600 06/27/14 2145  GLUCAP 130* 156* 174* 171* 163* 169*   Thyroid Function Tests:  Recent Labs Lab 06/25/14 0550  TSH 0.850   Urinalysis:  Recent Labs Lab 06/24/14 2220 06/27/14 1523  COLORURINE YELLOW YELLOW  LABSPEC 1.019 1.017  PHURINE 6.5 6.0  GLUCOSEU NEGATIVE NEGATIVE  HGBUR NEGATIVE NEGATIVE  BILIRUBINUR NEGATIVE NEGATIVE  KETONESUR NEGATIVE NEGATIVE  PROTEINUR 30* NEGATIVE  UROBILINOGEN 1.0 0.2  NITRITE NEGATIVE NEGATIVE  LEUKOCYTESUR NEGATIVE NEGATIVE    Micro Results: Recent Results (from the past 240 hour(s))  URINE CULTURE     Status: None   Collection Time    06/24/14 10:20 PM      Result Value Ref Range Status   Specimen Description URINE, CLEAN CATCH   Final   Special Requests Normal   Final   Culture  Setup Time     Final   Value: 06/25/2014  04:44     Performed at [place]     Final   Value: 40,000 COLONIES/ML     Performed at [facility]   Culture     Final   Value: Multiple bacterial morphotypes present, none predominant. Suggest appropriate recollection if clinically indicated.     Performed at [facility]   Report Status 06/26/2014 FINAL   Final  CSF CULTURE     Status: None   Collection Time    06/25/14 12:32 AM      Result Value Ref Range Status   Specimen Description CSF   Final   Special Requests TUBE 2 0.5CC   Final   Gram Stain     Final   Value: CYTOSPIN WBC PRESENT, PREDOMINANTLY MONONUCLEAR     NO ORGANISMS SEEN     Gram Stain Report Called to,Read Back By and Verified With: Gram Stain Report Called to,Read Back By and Verified With: M WHITE RN [id number] [facility]     Performed at [facility]   Culture     Final   Value: NO GROWTH 2 DAYS     Performed at [facility]   Report Status PENDING   Incomplete  GRAM STAIN     Status: None   Collection Time    06/25/14 12:32 AM      Result Value Ref Range Status   Specimen Description CSF   Final   Special Requests NONE   Final   Gram Stain     Final   Value: CYTOSPUN     NO ORGANISMS SEEN     WBC PRESENT, PREDOMINANTLY MONONUCLEAR     Results Called to: Henrine Screws [id number] [place]   Report Status 06/25/2014 FINAL   Final  CULTURE, BLOOD (ROUTINE X 2)     Status: None   Collection Time    06/25/14  1:27 AM      Result Value Ref Range Status   Specimen Description BLOOD RIGHT HAND   Final   Special Requests BOTTLES DRAWN AEROBIC AND ANAEROBIC 10CC EACH   Final   Culture  Setup Time     Final   Value: 06/25/2014 14:02     Performed at [facility]   Culture     Final   Value:        BLOOD CULTURE RECEIVED NO GROWTH TO DATE CULTURE WILL BE HELD FOR 5 DAYS BEFORE ISSUING A FINAL NEGATIVE REPORT     Performed at [facility]   Report Status PENDING   Incomplete  CULTURE, BLOOD (ROUTINE X 2)     Status: None   Collection Time    06/25/14  1:38 AM      Result Value Ref Range Status   Specimen Description BLOOD LEFT HAND   Final   Special Requests BOTTLES DRAWN AEROBIC AND ANAEROBIC 10CC EACH   Final   Culture  Setup Time     Final   Value: 06/25/2014 14:02     Performed at [facility]   Culture     Final   Value:        BLOOD CULTURE RECEIVED NO GROWTH TO DATE CULTURE WILL BE HELD FOR 5 DAYS BEFORE ISSUING A FINAL NEGATIVE REPORT     Performed at [facility]   Report Status PENDING   Incomplete  MRSA PCR SCREENING     Status: None   Collection Time  06/25/14  3:27 AM      Result Value Ref Range Status   MRSA by PCR NEGATIVE  NEGATIVE Final   Comment:            The GeneXpert MRSA Assay (FDA     approved for NASAL specimens     only), is one component of a      comprehensive MRSA colonization     surveillance program. It is not     intended to diagnose MRSA     infection nor to guide or     monitor treatment for     MRSA infections.   Studies/Results: Ct Angio Head W/cm &/or Wo Cm  06/26/2014   CLINICAL DATA:  Sudden onset of severe headache associated with photophobia, nausea and vomiting, and neck stiffness. Lumbar puncture showed elevated red cells and white cells. Remote history of possible meningitis several years ago. History of diabetes. Morbid obesity.  EXAM: CT ANGIOGRAPHY HEAD  TECHNIQUE: Multidetector CT imaging of the head was performed using the standard protocol during bolus administration of intravenous contrast. Multiplanar CT image reconstructions and MIPs were obtained to evaluate the vascular anatomy.  CONTRAST:  55mL OMNIPAQUE IOHEXOL 350 MG/ML SOLN  COMPARISON:  CT head 06/24/2014 was negative.  FINDINGS: Noncontrast examination was repeated, and continues to show no evidence for subarachnoid blood. No parenchymal hemorrhage, or temporal lobe edema to suggest developing HSV encephalitis. Ventricles remain unchanged in size and normal. No sinus or mastoid fluid to suggest an extracranial source of infection.  The internal carotid arteries are widely patent. Basilar artery is widely patent with vertebrals both contributing to its formation. There is no proximal intracranial stenosis or vascular occlusion. No significant irregularity to suggest diabetic atheromatous change.  Intracranial branch points were carefully examined, and no intracranial aneurysm is seen. Major dural venous sinuses appear patent.  Post infusion imaging demonstrates no abnormal enhancement of the brain or visible meninges.  The sella turcica is abnormal. There is expansion of the normal bony confines, with thinning of the dorsum sellae, and erosion of the floor, greater on the RIGHT. The sella is filled with increased amounts of cerebrospinal fluid, and the pituitary gland  is flattened. On coronal images 75 and 76 series 6010, and sagittal image 75 of series 6011, there is apparent dehiscence of the bone separating the pituitary gland from the sphenoid sinus. While there is no air-fluid level in the sphenoid sinus, and no history of rhinorrhea, the findings are consistent with empty sella, which has rarely been associated with episodes of spontaneous meningitis.  Review of the MIP images confirms the above findings.  IMPRESSION: Unremarkable CT of the brain without and with contrast. No evidence for subarachnoid or parenchymal hemorrhage. No evidence for developing encephalitis.  Empty sella with expansion as described above. Apparent dehiscence of the right-sided sellar floor could allow communication of the intracranial compartment with the sphenoid sinus.  CT angiography demonstrates no intracranial aneurysm or significant vascular irregularity.   Electronically Signed   By: Rolla Flatten M.D.   On: 06/26/2014 20:53   Dg Chest Port 1 View  06/27/2014   CLINICAL DATA:  Fever.  EXAM: PORTABLE CHEST - 1 VIEW  COMPARISON:  06/25/2014  FINDINGS: Right-sided PICC line tip: SVC. Mildly enlarged cardiopericardial silhouette. Bilateral indistinct vessels and interstitial accentuation. Low lung volumes. Thoracic spondylosis.  IMPRESSION: 1. Cardiomegaly with suspected mild interstitial edema. 2. Low lung volumes are present, causing crowding of the pulmonary vasculature. 3. Thoracic spondylosis.   Electronically  Signed   By: Sherryl Barters M.D.   On: 06/27/2014 13:39   Medications: I have reviewed the patient's current medications. Scheduled Meds: . acyclovir  600 mg Intravenous 3 times per day  . chlorthalidone  12.5 mg Oral Daily  . citalopram  20 mg Oral Daily  . insulin aspart  0-15 Units Subcutaneous TID WC  . sodium chloride  10-40 mL Intracatheter Q12H   Continuous Infusions:   PRN Meds:.morphine injection, ondansetron (ZOFRAN) IV, ondansetron, sodium chloride,  traMADol Assessment/Plan: 63 yo woman with PMH of HTN, DMII, depression and bacterial meningitis almost 20 yrs ago that was successfully treated with PO antibiotics, who presented with sudden onset neck stiffness and severe headache of 2 days' duration.  Sudden onset headache: initial neck stiffness, severe headache, photophobia and bilateral LE weakness. Had LP in ED with CSF showing 1020 RBC, 420 WBC, Protein of 105, glucose 90, predominant lymphocytes, and no organisms, concerning for viral meningitis. SAH was also in the differential though her CT head was negative for acute bleed. CT angio was also done which showed no bleed. LP finding is likely from traumatic tap.  Discontinued IV vanc, ampicillin, and ceftriaxone because ruled out bacterial cause. 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  - WBC trend: 15.2<--15.9<--13.6<--12.1<--11.3 - Consulted ID, acyclovir IV for HSV-2 meningitis until headache improves; will then switch to PO valcyclovir for total of 14 days - Frequent Neuro checks - Continue seizure precautions - Avoid anticoagulation for VTE prophylaxis - Tramadol 50mg  q8 PRN for pain, also tylenol PRN headache. - Has been using morphine 2mg  IV PRN but that doesn't seem to help headache . She can use for back pain from LP. - Zofran PRN for nausea - Follow up repeat BCX, UCX from yesterday during febrile episode; chest xray showed no signs of infection  Hypokalmeia: 3.1<--2.9<--3.4<--3.6 - given potassium chloride yesterday, recheck trend in am  HTN: Well controlled.  -continue home chlorthalidone 12.5mg  daily  -Hold losartan 100mg  daily in setting of IV contrast from CTA 8/30  DM2: with peripheral neuropathy: last hgb A1c 7.8 02/02/2014  -SSI, Accuchecks Q4hr  -hold home metformin 1000mg  BID   Depression  -continue citalopram 20mg  daily   FEN  -Carb mod diet -NS@75ml /hr   DVT ppx: SCDs  Dispo: Disposition is deferred at this time, awaiting improvement of current medical problems.   Anticipated discharge in approximately 1-2 day(s).   The patient does have a current PCP Francesca Oman, DO) and does need an [facility] hospital follow-up appointment after discharge.  The patient does not have transportation limitations that hinder transportation to clinic appointments.  .Services Needed at time of discharge: Y = Yes, Blank = No PT:   OT:   RN:   Equipment:   Other:     LOS: 4 days   Drucilla Schmidt, MD 06/28/2014, 8:05 AM

## 2014-06-28 NOTE — Progress Notes (Signed)
ANTIBIOTIC CONSULT NOTE - F/u  Pharmacy Consult for acyclovir Indication: r/o viral meningitis  Allergies  Allergen Reactions  . Codeine Sulfate Nausea And Vomiting and Other (See Comments)    Dizziness  . Penicillins Nausea And Vomiting  . Percocet [Oxycodone-Acetaminophen] Nausea And Vomiting  . Lisinopril Cough    Patient Measurements: Height: 5\' 3"  (160 cm) Weight: 226 lb 3.1 oz (102.6 kg) IBW/kg (Calculated) : 52.4  Vital Signs: Temp: 98.5 F (36.9 C) (09/01 1100) Temp src: Oral (09/01 1100) BP: 131/51 mmHg (09/01 0700) Pulse Rate: 74 (09/01 0700) Intake/Output from previous day: 08/31 0701 - 09/01 0700 In: 360 [P.O.:360] Out: 850 [Urine:850]  Labs:  Recent Labs  06/27/14 1250 06/27/14 1850 06/28/14 0446 06/28/14 0736  WBC 13.6*  --  15.9* 15.2*  HGB 10.0*  --  10.1* 10.1*  PLT 329  --  346 304  CREATININE 0.64 0.61  --  0.57   Estimated Creatinine Clearance: 84.5 ml/min (by C-G formula based on Cr of 0.57). No results found for this basename: VANCOTROUGH, Corlis Leak, VANCORANDOM, GENTTROUGH, GENTPEAK, GENTRANDOM, TOBRATROUGH, TOBRAPEAK, TOBRARND, AMIKACINPEAK, AMIKACINTROU, AMIKACIN,  in the last 72 hours   Microbiology: Recent Results (from the past 720 hour(s))  URINE CULTURE     Status: None   Collection Time    06/24/14 10:20 PM      Result Value Ref Range Status   Specimen Description URINE, CLEAN CATCH   Final   Special Requests Normal   Final   Culture  Setup Time     Final   Value: 06/25/2014 04:44     Performed at [place]     Final   Value: 40,000 COLONIES/ML     Performed at [facility]   Culture     Final   Value: Multiple bacterial morphotypes present, none predominant. Suggest appropriate recollection if clinically indicated.     Performed at [facility]   Report Status 06/26/2014 FINAL   Final  CSF CULTURE     Status: None   Collection Time    06/25/14 12:32 AM      Result Value Ref  Range Status   Specimen Description CSF   Final   Special Requests TUBE 2 0.5CC   Final   Gram Stain     Final   Value: CYTOSPIN WBC PRESENT, PREDOMINANTLY MONONUCLEAR     NO ORGANISMS SEEN     Gram Stain Report Called to,Read Back By and Verified With: Gram Stain Report Called to,Read Back By and Verified With: M WHITE RN [id number] [facility]     Performed at [facility]   Culture     Final   Value: NO GROWTH 3 DAYS     Performed at [facility]   Report Status 06/28/2014 FINAL   Final  GRAM STAIN     Status: None   Collection Time    06/25/14 12:32 AM      Result Value Ref Range Status   Specimen Description CSF   Final   Special Requests NONE   Final   Gram Stain     Final   Value: CYTOSPUN     NO ORGANISMS SEEN     WBC PRESENT, PREDOMINANTLY MONONUCLEAR     Results Called to: Henrine Screws [id number] [place]   Report Status 06/25/2014 FINAL   Final  CULTURE, BLOOD (ROUTINE X 2)     Status: None   Collection Time  06/25/14  1:27 AM      Result Value Ref Range Status   Specimen Description BLOOD RIGHT HAND   Final   Special Requests BOTTLES DRAWN AEROBIC AND ANAEROBIC 10CC EACH   Final   Culture  Setup Time     Final   Value: 06/25/2014 14:02     Performed at [facility]   Culture     Final   Value:        BLOOD CULTURE RECEIVED NO GROWTH TO DATE CULTURE WILL BE HELD FOR 5 DAYS BEFORE ISSUING A FINAL NEGATIVE REPORT     Performed at [facility]   Report Status PENDING   Incomplete  CULTURE, BLOOD (ROUTINE X 2)     Status: None   Collection Time    06/25/14  1:38 AM      Result Value Ref Range Status   Specimen Description BLOOD LEFT HAND   Final   Special Requests BOTTLES DRAWN AEROBIC AND ANAEROBIC 10CC EACH   Final   Culture  Setup Time     Final   Value: 06/25/2014 14:02     Performed at [facility]   Culture     Final   Value:        BLOOD CULTURE RECEIVED NO GROWTH TO DATE CULTURE WILL BE HELD FOR 5 DAYS BEFORE ISSUING A  FINAL NEGATIVE REPORT     Performed at [facility]   Report Status PENDING   Incomplete  MRSA PCR SCREENING     Status: None   Collection Time    06/25/14  3:27 AM      Result Value Ref Range Status   MRSA by PCR NEGATIVE  NEGATIVE Final   Comment:            The GeneXpert MRSA Assay (FDA     approved for NASAL specimens     only), is one component of a     comprehensive MRSA colonization     surveillance program. It is not     intended to diagnose MRSA     infection nor to guide or     monitor treatment for     MRSA infections.   Assessment: 61 yof continuing on acyclovir for r/o HSV meningitis. CSF revealed xanthochromia, high RBC count. All other antibiotics stopped.   Patient noted to still be having frontal headache (8/10) slightly improved today. Continues to report sensitivity and chills this morning. No n/v/d. Fever of 101.6 overnight. Plan to continue IV until headaches resolve then change to po per ID recommendations.  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

## 2014-06-28 NOTE — Progress Notes (Signed)
Pt seen and examined with Dr. Sherrine Maples and Dr. Eulas Post. 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 Will continue to monitor off tylenol - c/w acyclovir for now. Will transition to PO valacyclovir once pt improved - Pt with worsening WBC. Will follow up blood cx and urine cx drawn on 8/31 - Pt with hypokalemia. C/w PO supplementation for now. Likely secondary to chlorthalidone and poor dietary intake. Will monitor. May need to d/c chlorthalidone

## 2014-06-29 DIAGNOSIS — B003 Herpesviral meningitis: Principal | ICD-10-CM

## 2014-06-29 DIAGNOSIS — E119 Type 2 diabetes mellitus without complications: Secondary | ICD-10-CM

## 2014-06-29 LAB — CBC
HCT: 32 % — ABNORMAL LOW (ref 36.0–46.0)
HEMOGLOBIN: 10.1 g/dL — AB (ref 12.0–15.0)
MCH: 25.4 pg — AB (ref 26.0–34.0)
MCHC: 31.6 g/dL (ref 30.0–36.0)
MCV: 80.4 fL (ref 78.0–100.0)
Platelets: 315 10*3/uL (ref 150–400)
RBC: 3.98 MIL/uL (ref 3.87–5.11)
RDW: 15.3 % (ref 11.5–15.5)
WBC: 14.2 10*3/uL — ABNORMAL HIGH (ref 4.0–10.5)

## 2014-06-29 LAB — BASIC METABOLIC PANEL
Anion gap: 9 (ref 5–15)
BUN: 10 mg/dL (ref 6–23)
CHLORIDE: 90 meq/L — AB (ref 96–112)
CO2: 39 mEq/L — ABNORMAL HIGH (ref 19–32)
CREATININE: 0.55 mg/dL (ref 0.50–1.10)
Calcium: 9.4 mg/dL (ref 8.4–10.5)
GFR calc Af Amer: >90 mL/min (ref >90)
GFR calc non Af Amer: >90 mL/min (ref >90)
Glucose, Bld: 153 mg/dL — ABNORMAL HIGH (ref 70–99)
Potassium: 3.6 mEq/L — ABNORMAL LOW (ref 3.7–5.3)
Sodium: 138 mEq/L (ref 137–147)

## 2014-06-29 LAB — GLUCOSE, CAPILLARY
GLUCOSE-CAPILLARY: 144 mg/dL — AB (ref 70–99)
GLUCOSE-CAPILLARY: 148 mg/dL — AB (ref 70–99)
Glucose-Capillary: 158 mg/dL — ABNORMAL HIGH (ref 70–99)
Glucose-Capillary: 146 mg/dL — ABNORMAL HIGH (ref 70–99)
Glucose-Capillary: 128 mg/dL — ABNORMAL HIGH (ref 70–99)

## 2014-06-29 MED ORDER — DIPHENHYDRAMINE HCL 25 MG PO CAPS
25.0000 mg | ORAL_CAPSULE | Freq: Once | ORAL | Status: AC
  Administered 2014-06-29: 25 mg via ORAL
  Filled 2014-06-29: qty 1

## 2014-06-29 MED ORDER — POTASSIUM CHLORIDE CRYS ER 20 MEQ PO TBCR
40.0000 meq | EXTENDED_RELEASE_TABLET | Freq: Two times a day (BID) | ORAL | Status: AC
  Administered 2014-06-29: 40 meq via ORAL
  Filled 2014-06-29: qty 2

## 2014-06-29 NOTE — Progress Notes (Signed)
Subjective:  Sherri Gill does not currently have a headache, but feels as though it might come on soon. She denies vomiting, nausea, lower extremity weakness. She is very talkative about her mother, who lives with her, and has advanced dementia. Patient thinks she may have had some confusion last night on the morphine (thought her brother was still in the room after he left).  Events: kdur given overnight when K came back at 3.1. This morning, K = 3.6  Objective: Vital signs in last 24 hours: Filed Vitals:   06/28/14 2300 06/29/14 0400 06/29/14 0805 06/29/14 0819  BP: 127/58 163/65 145/56   Pulse: 64 68 75   Temp: 98.2 F (36.8 C) 98.2 F (36.8 C)  97.9 F (36.6 C)  TempSrc: Oral Oral  Oral  Resp: 22  20   Height:      Weight:      SpO2:  96%     Weight change:   Intake/Output Summary (Last 24 hours) at 06/29/14 1103 Last data filed at 06/29/14 0600  Gross per 24 hour  Intake    804 ml  Output   1600 ml  Net   -796 ml   Vitals reviewed. General: resting in bed with lights out, comfortable appearing HEENT: PERRL, EOMI, no scleral icterus, patient squints left eye when she speaks at times, but there is no ptosis when she is relaxed or asked to open her eyes Cardiac: RRR, no rubs, murmurs or gallops Pulm: clear to auscultation bilaterally, no wheezes, rales, or rhonchi Abd: soft, nontender, nondistended, BS present Ext: warm and well perfused, no pedal edema, no rashes noted Neuro: A&Ox3, strength 5/5 in bilateral LE and UE, CN II-XII intact, sensation to light touch equal in bilateral upper and lower extremities, FNF coordination intact, normal gait  Lab Results: Basic Metabolic Panel:  Recent Labs Lab 06/28/14 1649 06/29/14 0400  NA 132* 138  K 3.1* 3.6*  CL 85* 90*  CO2 39* 39*  GLUCOSE 134* 153*  BUN 9 10  CREATININE 0.59 0.55  CALCIUM 9.3 9.4   Liver Function Tests:  Recent Labs Lab 06/25/14 0550  AST 16  ALT 20  ALKPHOS 103  BILITOT 0.3  PROT 7.3   ALBUMIN 3.7   CBC:  Recent Labs Lab 06/24/14 1711  06/27/14 1250  06/28/14 1500 06/29/14 0400  WBC 11.3*  < > 13.6*  < > 16.7* 14.2*  NEUTROABS 8.5*  --  10.2*  --   --   --   HGB 11.3*  < > 10.0*  < > 10.5* 10.1*  HCT 34.7*  < > 31.0*  < > 31.9* 32.0*  MCV 77.5*  < > 77.5*  < > 79.2 80.4  PLT 367  < > 329  < > 322 315  < > = values in this interval not displayed. CBG:  Recent Labs Lab 06/27/14 2145 06/28/14 0740 06/28/14 1212 06/28/14 1611 06/28/14 2134 06/29/14 0802  GLUCAP 169* 235* 148* 141* 193* 158*   Thyroid Function Tests:  Recent Labs Lab 06/25/14 0550  TSH 0.850   Urinalysis:  Recent Labs Lab 06/24/14 2220 06/27/14 1523  COLORURINE YELLOW YELLOW  LABSPEC 1.019 1.017  PHURINE 6.5 6.0  GLUCOSEU NEGATIVE NEGATIVE  HGBUR NEGATIVE NEGATIVE  BILIRUBINUR NEGATIVE NEGATIVE  KETONESUR NEGATIVE NEGATIVE  PROTEINUR 30* NEGATIVE  UROBILINOGEN 1.0 0.2  NITRITE NEGATIVE NEGATIVE  LEUKOCYTESUR NEGATIVE NEGATIVE    Micro Results: Recent Results (from the past 240 hour(s))  URINE CULTURE  Status: None   Collection Time    06/24/14 10:20 PM      Result Value Ref Range Status   Specimen Description URINE, CLEAN CATCH   Final   Special Requests Normal   Final   Culture  Setup Time     Final   Value: 06/25/2014 04:44     Performed at [place]     Final   Value: 40,000 COLONIES/ML     Performed at [facility]   Culture     Final   Value: Multiple bacterial morphotypes present, none predominant. Suggest appropriate recollection if clinically indicated.     Performed at [facility]   Report Status 06/26/2014 FINAL   Final  CSF CULTURE     Status: None   Collection Time    06/25/14 12:32 AM      Result Value Ref Range Status   Specimen Description CSF   Final   Special Requests TUBE 2 0.5CC   Final   Gram Stain     Final   Value: CYTOSPIN WBC PRESENT, PREDOMINANTLY MONONUCLEAR     NO ORGANISMS SEEN      Gram Stain Report Called to,Read Back By and Verified With: Gram Stain Report Called to,Read Back By and Verified With: M WHITE RN [id number] [facility]     Performed at [facility]   Culture     Final   Value: NO GROWTH 3 DAYS     Performed at [facility]   Report Status 06/28/2014 FINAL   Final  GRAM STAIN     Status: None   Collection Time    06/25/14 12:32 AM      Result Value Ref Range Status   Specimen Description CSF   Final   Special Requests NONE   Final   Gram Stain     Final   Value: CYTOSPUN     NO ORGANISMS SEEN     WBC PRESENT, PREDOMINANTLY MONONUCLEAR     Results Called to: Henrine Screws [id number] [place]   Report Status 06/25/2014 FINAL   Final  CULTURE, BLOOD (ROUTINE X 2)     Status: None   Collection Time    06/25/14  1:27 AM      Result Value Ref Range Status   Specimen Description BLOOD RIGHT HAND   Final   Special Requests BOTTLES DRAWN AEROBIC AND ANAEROBIC 10CC EACH   Final   Culture  Setup Time     Final   Value: 06/25/2014 14:02     Performed at [facility]   Culture     Final   Value:        BLOOD CULTURE RECEIVED NO GROWTH TO DATE CULTURE WILL BE HELD FOR 5 DAYS BEFORE ISSUING A FINAL NEGATIVE REPORT     Performed at [facility]   Report Status PENDING   Incomplete  CULTURE, BLOOD (ROUTINE X 2)     Status: None   Collection Time    06/25/14  1:38 AM      Result Value Ref Range Status   Specimen Description BLOOD LEFT HAND   Final   Special Requests BOTTLES DRAWN AEROBIC AND ANAEROBIC 10CC EACH   Final   Culture  Setup Time     Final   Value: 06/25/2014 14:02     Performed at [facility]   Culture     Final   Value:  BLOOD CULTURE RECEIVED NO GROWTH TO DATE CULTURE WILL BE HELD FOR 5 DAYS BEFORE ISSUING A FINAL NEGATIVE REPORT     Performed at [facility]   Report Status PENDING   Incomplete  MRSA PCR SCREENING     Status: None   Collection Time    06/25/14  3:27 AM      Result Value  Ref Range Status   MRSA by PCR NEGATIVE  NEGATIVE Final   Comment:            The GeneXpert MRSA Assay (FDA     approved for NASAL specimens     only), is one component of a     comprehensive MRSA colonization     surveillance program. It is not     intended to diagnose MRSA     infection nor to guide or     monitor treatment for     MRSA infections.  URINE CULTURE     Status: None   Collection Time    06/27/14  3:23 PM      Result Value Ref Range Status   Specimen Description URINE, RANDOM   Final   Special Requests Normal   Final   Culture  Setup Time     Final   Value: 06/27/2014 19:08     Performed at [place]     Final   Value: NO GROWTH     Performed at [facility]   Culture     Final   Value: NO GROWTH     Performed at [facility]   Report Status 06/28/2014 FINAL   Final  CULTURE, BLOOD (ROUTINE X 2)     Status: None   Collection Time    06/27/14  6:50 PM      Result Value Ref Range Status   Specimen Description BLOOD LEFT ARM   Final   Special Requests BOTTLES DRAWN AEROBIC AND ANAEROBIC 10CC   Final   Culture  Setup Time     Final   Value: 06/28/2014 00:25     Performed at [facility]   Culture     Final   Value:        BLOOD CULTURE RECEIVED NO GROWTH TO DATE CULTURE WILL BE HELD FOR 5 DAYS BEFORE ISSUING A FINAL NEGATIVE REPORT     Performed at [facility]   Report Status PENDING   Incomplete  CULTURE, BLOOD (ROUTINE X 2)     Status: None   Collection Time    06/27/14  7:00 PM      Result Value Ref Range Status   Specimen Description BLOOD LEFT HAND   Final   Special Requests BOTTLES DRAWN AEROBIC AND ANAEROBIC 10CC   Final   Culture  Setup Time     Final   Value: 06/28/2014 00:25     Performed at [facility]   Culture     Final   Value:        BLOOD CULTURE RECEIVED NO GROWTH TO DATE CULTURE WILL BE HELD FOR 5 DAYS BEFORE ISSUING A FINAL NEGATIVE REPORT     Performed at [facility]   Report Status PENDING   Incomplete   Studies/Results: Dg Chest Port 1 View  06/27/2014   CLINICAL DATA:  Fever.  EXAM: PORTABLE CHEST - 1 VIEW  COMPARISON:  06/25/2014  FINDINGS: Right-sided PICC line tip: SVC. Mildly enlarged cardiopericardial silhouette. Bilateral indistinct  vessels and interstitial accentuation. Low lung volumes. Thoracic spondylosis.  IMPRESSION: 1. Cardiomegaly with suspected mild interstitial edema. 2. Low lung volumes are present, causing crowding of the pulmonary vasculature. 3. Thoracic spondylosis.   Electronically Signed   By: Sherryl Barters M.D.   On: 06/27/2014 13:39   Medications: I have reviewed the patient's current medications. Scheduled Meds: . acyclovir  600 mg Intravenous 3 times per day  . citalopram  20 mg Oral Daily  . docusate sodium  100 mg Oral Daily  . insulin aspart  0-15 Units Subcutaneous TID WC  . losartan  100 mg Oral Daily  . potassium chloride  40 mEq Oral BID  . sodium chloride  10-40 mL Intracatheter Q12H   Continuous Infusions:   PRN Meds:.ibuprofen, morphine injection, ondansetron (ZOFRAN) IV, ondansetron, sodium chloride, traMADol Assessment/Plan: 63 yo woman with PMH of HTN, DMII, depression and bacterial meningitis almost 20 yrs ago that was successfully treated with PO antibiotics, who presented with sudden onset neck stiffness and severe headache of 2 days' duration.  HSV Meningitis: initial neck stiffness, severe headache, photophobia and bilateral LE weakness. Had LP in ED with CSF showing 1020 RBC, 420 WBC, Protein of 105, glucose 90, predominant lymphocytes, and no organisms, concerning for viral meningitis. SAH was also in the differential though her CT head and CT angio were negative for acute bleed. LP finding is likely from traumatic tap. 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  - WBC trending down: 14.2<--16.7<--15.2<--15.9<--13.6<--12.1<--11.3 - Consulted ID, acyclovir IV for HSV-2 meningitis until headache improves;  will then switch to PO valcyclovir for total of 14 days - Frequent Neuro checks - Continue seizure precautions - Avoid anticoagulation for VTE prophylaxis - Tramadol 50mg  q8 PRN for pain, also ibuprofen PRN; PRN morphine d/c today - Zofran PRN for nausea - BCX, UCX during febrile episode on 8/31 have NGTD; chest xray showed no signs of infection  Hypokalmeia: 3.6<--3.1<--2.9<--3.4<--3.6 - getting kdur 40mg  BID, given an extra dose overnight  HTN: Well controlled.  - given hypokalemia, was taken off home chlorthalidone 12.5mg  daily  - was restarted on losartan 100mg  daily (which had been d/c in setting of IV contrast from CTA 8/30)  DM2: with peripheral neuropathy: last hgb A1c 7.8 02/02/2014  - SSI, Accuchecks Q4hr  - hold home metformin 1000mg  BID   Depression  - continue citalopram 20mg  daily   FEN  - Carb mod diet - NS@75ml /hr   DVT ppx: SCDs  Dispo: Disposition is deferred at this time, awaiting improvement of current medical problems.  Anticipated discharge in approximately 1 day(s).   The patient does have a current PCP Francesca Oman, DO) and does need an [facility] hospital follow-up appointment after discharge.  The patient does not have transportation limitations that hinder transportation to clinic appointments.  .Services Needed at time of discharge: Y = Yes, Blank = No PT:   OT:   RN:   Equipment:   Other:     LOS: 5 days   Drucilla Schmidt, MD 06/29/2014, 11:03 AM

## 2014-06-29 NOTE — Progress Notes (Signed)
Pt seen and examined with Dr. Eulas Post. Please refer to resident note for details  Pt states headache has improved today- 5/10. No fevers. Photophobia resolved  Exam:  Neuro- no focal deficits, Power 5/5 b/l UE, LE  Cardio- RRR, normal heart sounds  Lungs- CTA b/l  Abd- soft, non tender, non distended, BS +  Ext- no pedal edema   Assessment and Plan:  Please refer to resident note for detailed plan   - Pt with HSV meningitis  - No fevers today. Will continue to monitor off tylenol  - c/w acyclovir for now. Will transition to PO valacyclovir to complete 14 day course on d/c - WBC improving. Blood cx, urine cx negative till date  - Pt with hypokalemia. Resolving now off chlorthalidone. Losartan resumed for HTN. May need to add norvasc for BP control if elevated

## 2014-06-29 NOTE — Discharge Summary (Signed)
Name: Sherri Gill MRN: [id number] DOB: 31-Jan-1951 63 y.o. PCP: Francesca Oman, DO  Date of Admission: 06/24/2014  9:04 PM Date of Discharge: 06/29/2014 Attending Physician: Bartholomew Crews, MD  Discharge Diagnosis: Principal Problem:   Meningitis Active Problems:   Type 2 diabetes mellitus, uncontrolled, with neuropathy   Peripheral neuropathy   Obesity, BMI 35-40   Chronic anemia  Discharge Medications:   Medication List         acetaminophen 500 MG tablet  Commonly known as:  TYLENOL  Take 2 tablets (1,000 mg total) by mouth every 8 (eight) hours as needed.     ALKA-SELTZER PLUS COLD 2-7.8-325 MG Tbef  Generic drug:  Chlorphen-Phenyleph-ASA  Take 1 tablet by mouth daily as needed (for cold).     ANACIN 81 MG EC tablet  Generic drug:  aspirin  Take 81 mg by mouth daily.     Biotin 5000 MCG Caps  Take 10,000 mcg by mouth every morning.     chlorthalidone 25 MG tablet  Commonly known as:  HYGROTON  Take 0.5 tablets (12.5 mg total) by mouth daily.     citalopram 20 MG tablet  Commonly known as:  CELEXA  Take 1 tablet (20 mg total) by mouth daily.     ibuprofen 800 MG tablet  Commonly known as:  ADVIL,MOTRIN  Take 1 tablet (800 mg total) by mouth every 8 (eight) hours as needed.     losartan 100 MG tablet  Commonly known as:  COZAAR  Take 1 tablet (100 mg total) by mouth daily.     metFORMIN 1000 MG tablet  Commonly known as:  GLUCOPHAGE  Take 1,000 mg by mouth 2 (two) times daily with a meal.     pravastatin 40 MG tablet  Commonly known as:  PRAVACHOL  Take 1 tablet (40 mg total) by mouth daily.     psyllium 58.6 % packet  Commonly known as:  METAMUCIL  Take 1 packet by mouth daily as needed (for fiber supplement).     traMADol 50 MG tablet  Commonly known as:  ULTRAM  Take 1 tablet (50 mg total) by mouth every 6 (six) hours as needed for moderate pain.     valACYclovir 1000 MG tablet  Commonly known as:  VALTREX  Take 1 tablet (1,000 mg  total) by mouth 3 (three) times daily.        Disposition and follow-up:   Sherri Gill was discharged from [facility] in Stable condition.  At the hospital follow up visit please address:  1.  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 She also was found to be hypokalemic (likely due to her home chlorthalidone or her poor dietary intake prior to admission). We stopped her chlorthalidone (so she is only taking losartan)... please reassess her BP at her appointment.  Patient was also complaining of musculoskeletal left-sided neck pain on day of discharge; please check to see that it has resolved.  2.  Labs / imaging needed at time of follow-up: BMET for potassium level  3.  Pending labs/ test needing follow-up: none  Follow-up Appointments: Duwaine Maxin, MD on September 26th at 2:15 PM at [address], [place], [place] [address] [phone]  Discharge Instructions: Stop taking your chlorthalidone (one of your blood pressure medications). Your primary care doctor will reassess your blood pressure at your next appointment.  Take your valacyclovir (Valtrex) three times a day for the next 10 days.  If your headache  gets suddenly worse, if light starts bothering you a lot, or if you start getting fevers, please come back to the hospital.  Viral Meningitis  Meningitis is an inflammation of the fluid and membranes surrounding the spinal cord and brain. Viral meningitis is caused by a viral infection. It is important to know whether a virus or bacterium is causing your meningitis. Viral meningitis is generally less severe than bacterial meningitis. Aseptic meningitis is any meningitis not caused by a bacterial infection, including viral meningitis. Meningitis can also be caused by fungi, parasites, certain medicines, cancer, and autoimmune disorders.  Uncomplicated meningitis usually resolves on its own in 7 to 10 days. However, there can be complicated cases that last  much longer. People with lowered immune systems are at greater risk for poor outcomes from meningitis. It is important to get treatment as soon as possible to minimize the impact of a meningitis infection. Long-term complications could include seizures, hearing loss, weakness, paralysis, blindness, or cognitive impairment.  CAUSES  Most cases of meningitis are due to viruses. Viruses that cause meningitis include enteroviruses (such as polio and coxsackie viruses), herpes simplex virus, varicella zoster virus, mumps, and HIV.  SYMPTOMS  Symptoms can develop over many hours. They may even take a few days to develop. Common symptoms of meningitis in people over the age of 2 years include:  High fever.  Headache.  Stiff neck.  Irritability.  Nausea and vomiting.  Fatigue.  Discomfort from exposure to light.  Discomfort from exposure to loud noise.  Trouble walking.  Altered mental status.  Seizures. DIAGNOSIS  Early diagnosis and treatment are very important. If you have symptoms, you should see your caregiver right away. Your evaluation may include lab tests of your blood and spinal fluid. A spinal fluid sample is taken through a procedure called a lumbar puncture or spinal tap. During the procedure, a needle is inserted into an area in the lower back. You may also have a CT scan of your brain as part of your evaluation. If there is suspicion of an infection or inflammation of the brain (encephalitis), an MRI scan may be done.  TREATMENT  Antibiotics are not effective against a virus. Antiviral medicines may be used depending on the cause of your meningitis.  Treatment for viral meningitis focuses on reducing your symptoms. This may include rest, hydration, and medicines to reduce fever and pain.  Steroids may also be used if there is significant swelling of the brain. PREVENTION  Vaccines can help prevent meningitis caused by polio, measles, and mumps.  Strict hand washing is effective in  controlling the spread of many causes of meningitis.  Using barrier protection during sexual intercourse can prevent the spread of meningitis caused by viruses such as the herpes virus or HIV.  Protection from mosquitoes, especially for the very young and very old, can prevent some causes of meningitis. HOME CARE INSTRUCTIONS  Rest.  Drink enough water and fluids to keep your urine clear or pale yellow.  Take all medicine as prescribed.  Practice good hygiene to prevent others from getting sick.  Follow up with your caregiver as directed. SEEK IMMEDIATE MEDICAL CARE IF:  You develop dizziness.  You have a very fast heartbeat.  You have difficulty breathing.  You develop confusion.  You have seizures.  You have unstoppable nausea and vomiting.  You develop any worsening symptoms. MAKE SURE YOU:  Understand these instructions.  Will watch your condition.  Will get help right away if you are  not doing well or get worse. Document Released: 10/04/2002 Document Revised: 01/06/2012 Document Reviewed: 01/29/2011  [facility] Patient Information 2015 [place], [place]. This information is not intended to replace advice given to you by your health care provider. Make sure you discuss any questions you have with your health care provider.   Consultations:  none  Procedures Performed:  Ct Angio Head W/cm &/or Wo Cm: 06/26/2014  COMPARISON:  CT head 06/24/2014 was negative.  FINDINGS: Noncontrast examination was repeated, and continues to show no evidence for subarachnoid blood. No parenchymal hemorrhage, or temporal lobe edema to suggest developing HSV encephalitis. Ventricles remain unchanged in size and normal. No sinus or mastoid fluid to suggest an extracranial source of infection.  The internal carotid arteries are widely patent. Basilar artery is widely patent with vertebrals both contributing to its formation. There is no proximal intracranial stenosis or vascular occlusion. No significant  irregularity to suggest diabetic atheromatous change.  Intracranial branch points were carefully examined, and no intracranial aneurysm is seen. Major dural venous sinuses appear patent.  Post infusion imaging demonstrates no abnormal enhancement of the brain or visible meninges.  The sella turcica is abnormal. There is expansion of the normal bony confines, with thinning of the dorsum sellae, and erosion of the floor, greater on the RIGHT. The sella is filled with increased amounts of cerebrospinal fluid, and the pituitary gland is flattened. On coronal images 75 and 76 series 6010, and sagittal image 75 of series 6011, there is apparent dehiscence of the bone separating the pituitary gland from the sphenoid sinus. While there is no air-fluid level in the sphenoid sinus, and no history of rhinorrhea, the findings are consistent with empty sella, which has rarely been associated with episodes of spontaneous meningitis.  Review of the MIP images confirms the above findings.  IMPRESSION: Unremarkable CT of the brain without and with contrast. No evidence for subarachnoid or parenchymal hemorrhage. No evidence for developing encephalitis.  Empty sella with expansion as described above. Apparent dehiscence of the right-sided sellar floor could allow communication of the intracranial compartment with the sphenoid sinus.  CT angiography demonstrates no intracranial aneurysm or significant vascular irregularity.    Ct Head Wo Contrast: 06/24/2014    FINDINGS: No acute cortical infarct, hemorrhage, or mass lesion is present. The ventricles are of normal size. No significant extra-axial fluid collection is evident. The paranasal sinuses and mastoid air cells are clear. The osseous skull is intact.  IMPRESSION: Negative CT of the head.   Dg Chest Port 1 View (taken during a febrile episode): 06/27/2014   COMPARISON:  06/25/2014  FINDINGS: Right-sided PICC line tip: SVC. Mildly enlarged cardiopericardial silhouette.  Bilateral indistinct vessels and interstitial accentuation. Low lung volumes. Thoracic spondylosis.  IMPRESSION: 1. Cardiomegaly with suspected mild interstitial edema. 2. Low lung volumes are present, causing crowding of the pulmonary vasculature. 3. Thoracic spondylosis.     Portable Chest 1 View 06/25/2014   COMPARISON:  08/18/2012  FINDINGS: Cardiac shadow is prominent but accentuated by the portable technique. The overall inspiratory effort is poor with vascular crowding. A mild degree of vascular congestion is noted. No focal infiltrate is seen. No bony abnormality is noted.  IMPRESSION: Poor inspiratory effort.  Mild vascular congestion is noted.     Admission HPI: Sherri Gill is a 63 year old woman with history of depression, chronic anemia, HTN, DM2 with peripheral neuropathy presenting with headache. The headache is 10/10 in severity, frontal, constant and worsening. The onset was sudden last night. It radiates to  her neck. She tried tylenol, ibuprofen and alka seltzer plus with minimal relief. It is exacerbated by movement, bright light and sound. She reports subjective fevers, chills, diaphoresis, dizzines, neck pain, neck stiffness, nausea, nonbloody/nonbilious vomiting x2. She had a similar past episode that she attributed to meningitis. Denied seizures in the past. Lives at home with her mother who is a hospice patient with dementia and her son. The nurse aide that came to visit her mother was sick. No recent travel or work outdoors/insect bites. Reports a little chest pain and back pain. Denies cough, SOB, diarrhea, dysuria, hematuria, paresthesias, numbness, LOC, edema, or rash.  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 She was treated for the meningitis with 3 days of IV acyclovir until her headache resolved; this is to be followed by a 10 day course of PO acyclovir. She was also treated for hypokalemia that was noted on admission and resolved during her stay.  HSV Meningitis: LP in ED with CSF showed 1020 RBC, 420 WBC, Protein of 105, glucose 90, predominant lymphocytes, and no organisms, concerning for viral meningitis. SAH was also in the differential though her CT head and CT angio were negative for acute bleed. LP finding was likely from a traumatic tap. 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 Her WBC count trended down: 14.2<--16.7<--15.2<--15.9<--13.6<--12.1<--11.3   Hypokalemia: The patient's hypokalemia was treated with 40mg  BID Kdur and was thought to be cause by her home chlorthalidone or her poor PO intake prior to admission. Her home chlorthalidone was stopped (and the patient was given only her home losartan) partway through admission. K trend: 3.6<--3.1<--2.9<--3.4<--3.6   HTN: Well controlled (SBP in the 130-140) first on home chlorthalidone (in the setting of IV contrast from CTA 8/30), then home losartan (in the setting of hypokalmia).    Discharge Vitals:   BP 145/56  Pulse 75  Temp(Src) 98.7 F (37.1 C) (Oral)  Resp 20  Ht 5\' 3"  (1.6 m)  Wt 102.6 kg (226 lb 3.1 oz)  BMI 40.08 kg/m2  SpO2 96%   Signed: Drucilla Schmidt, MD 06/29/2014, 2:38 PM    Services Ordered on Discharge: none Equipment Ordered on Discharge: none

## 2014-06-30 DIAGNOSIS — E1149 Type 2 diabetes mellitus with other diabetic neurological complication: Secondary | ICD-10-CM

## 2014-06-30 DIAGNOSIS — A879 Viral meningitis, unspecified: Secondary | ICD-10-CM

## 2014-06-30 DIAGNOSIS — I10 Essential (primary) hypertension: Secondary | ICD-10-CM

## 2014-06-30 DIAGNOSIS — D649 Anemia, unspecified: Secondary | ICD-10-CM

## 2014-06-30 LAB — GLUCOSE, CAPILLARY
GLUCOSE-CAPILLARY: 155 mg/dL — AB (ref 70–99)
GLUCOSE-CAPILLARY: 118 mg/dL — AB (ref 70–99)

## 2014-06-30 LAB — BASIC METABOLIC PANEL
Anion gap: 13 (ref 5–15)
BUN: 11 mg/dL (ref 6–23)
CO2: 34 meq/L — AB (ref 19–32)
CREATININE: 0.59 mg/dL (ref 0.50–1.10)
Calcium: 9.6 mg/dL (ref 8.4–10.5)
Chloride: 91 mEq/L — ABNORMAL LOW (ref 96–112)
GFR calc non Af Amer: >90 mL/min (ref >90)
GLUCOSE: 140 mg/dL — AB (ref 70–99)
Potassium: 3.6 mEq/L — ABNORMAL LOW (ref 3.7–5.3)
Sodium: 138 mEq/L (ref 137–147)

## 2014-06-30 MED ORDER — ACYCLOVIR 200 MG PO CAPS
400.0000 mg | ORAL_CAPSULE | Freq: Three times a day (TID) | ORAL | Status: DC
  Administered 2014-06-30: 400 mg via ORAL
  Filled 2014-06-30 (×3): qty 2

## 2014-06-30 MED ORDER — VALACYCLOVIR HCL 1 G PO TABS: 1000.0000 mg | ORAL_TABLET | Freq: Three times a day (TID) | ORAL | Status: AC

## 2014-06-30 MED ORDER — TRAMADOL HCL 50 MG PO TABS: 50.0000 mg | ORAL_TABLET | Freq: Four times a day (QID) | ORAL | Status: DC | PRN

## 2014-06-30 MED ORDER — ACYCLOVIR 200 MG PO CAPS: 400.0000 mg | ORAL_CAPSULE | Freq: Three times a day (TID) | ORAL | Status: DC

## 2014-06-30 NOTE — Progress Notes (Signed)
Subjective: Ms. Even feels well, with no headache or photophobia this morning. She is complaining of new neck pain, worst when she moves her head to look left or tilts her head to the left. She is nervous about leaving the hospital.  Of note, the patient is afebrile this morning; Tmax is 99.0, at 8:00PM last night.  Objective: Vital signs in last 24 hours: Filed Vitals:   06/29/14 2000 06/29/14 2315 06/30/14 0000 06/30/14 0330  BP: 117/42 133/57  132/63  Pulse:  76 89 86  Temp: 99 F (37.2 C) 98.5 F (36.9 C)  98.8 F (37.1 C)  TempSrc: Oral Oral  Oral  Resp:  24 34 27  Height:      Weight:      SpO2:  99% 100% 92%   Weight change:   Intake/Output Summary (Last 24 hours) at 06/30/14 0724 Last data filed at 06/30/14 0603  Gross per 24 hour  Intake   3224 ml  Output   1200 ml  Net   2024 ml   Vitals reviewed.  General: resting in bed with lights out, comfortable appearing HEENT: PERRL, EOMI, no scleral icterus, no tenderness to palpation in neck, no pain on movement of chin to chest or the reverse; some pain on head tilt L>R and some pain on pivot of head L>R. All of the pain is concentrated just left of the cervical spine in one spot that the patient can point to. Cardiac: RRR, no rubs, murmurs or gallops Pulm: clear to auscultation bilaterally, no wheezes, rales, or rhonchi Abd: soft, nontender, nondistended, BS present Ext: warm and well perfused, no lower extremity edema, no rashes noted Neuro: A&Ox3, strength 5/5 in bilateral LE and UE, CN II-XII intact, sensation to light touch equal in bilateral upper and lower extremities, FNF coordination intact, normal gait  Lab Results: Basic Metabolic Panel:  Recent Labs Lab 06/29/14 0400 06/30/14 0600  NA 138 138  K 3.6* 3.6*  CL 90* 91*  CO2 39* 34*  GLUCOSE 153* 140*  BUN 10 11  CREATININE 0.55 0.59  CALCIUM 9.4 9.6   Liver Function Tests:  Recent Labs Lab 06/25/14 0550  AST 16  ALT 20  ALKPHOS 103    BILITOT 0.3  PROT 7.3  ALBUMIN 3.7   CBC:  Recent Labs Lab 06/24/14 1711  06/27/14 1250  06/28/14 1500 06/29/14 0400  WBC 11.3*  < > 13.6*  < > 16.7* 14.2*  NEUTROABS 8.5*  --  10.2*  --   --   --   HGB 11.3*  < > 10.0*  < > 10.5* 10.1*  HCT 34.7*  < > 31.0*  < > 31.9* 32.0*  MCV 77.5*  < > 77.5*  < > 79.2 80.4  PLT 367  < > 329  < > 322 315  < > = values in this interval not displayed. CBG:  Recent Labs Lab 06/28/14 2134 06/29/14 0802 06/29/14 1209 06/29/14 1521 06/29/14 1706 06/29/14 2148  GLUCAP 193* 158* 144* 146* 128* 148*   Thyroid Function Tests:  Recent Labs Lab 06/25/14 0550  TSH 0.850   Urinalysis:  Recent Labs Lab 06/24/14 2220 06/27/14 1523  COLORURINE YELLOW YELLOW  LABSPEC 1.019 1.017  PHURINE 6.5 6.0  GLUCOSEU NEGATIVE NEGATIVE  HGBUR NEGATIVE NEGATIVE  BILIRUBINUR NEGATIVE NEGATIVE  KETONESUR NEGATIVE NEGATIVE  PROTEINUR 30* NEGATIVE  UROBILINOGEN 1.0 0.2  NITRITE NEGATIVE NEGATIVE  LEUKOCYTESUR NEGATIVE NEGATIVE    Micro Results: Urine culture: no predominant bacterial morphotypes CSF  culture: No organisms seen, cytospin WBC present, predominantly mononuclear Blood culture: NGTD  Medications: I have reviewed the patient's current medications. Scheduled Meds: . acyclovir  600 mg Intravenous 3 times per day  . citalopram  20 mg Oral Daily  . docusate sodium  100 mg Oral Daily  . insulin aspart  0-15 Units Subcutaneous TID WC  . losartan  100 mg Oral Daily  . 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  HSV Meningitis: LP in ED with  CSF showed 1020 RBC, 420 WBC, Protein of 105, glucose 90, predominant lymphocytes, and no organisms, concerning for viral meningitis. SAH was also in the differential though her CT head and CT angio were negative for acute bleed. LP finding is likely from traumatic tap. 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  - WBC trending down: 14.2<--16.7<--15.2<--15.9<--13.6<--12.1<--11.3 - As per ID consultation, acyclovir IV was given for HSV-2 meningitis until today, when patient's headache resolved; switching today to PO for total of 14 days of treatment (will need 10 days as outpatient) - Frequent Neuro checks and continue seizure precautions - Avoid anticoagulation - Tramadol 50mg  q8 PRN for pain, also ibuprofen PRN; morphine was d/c yesterday - Zofran PRN for nausea  Hypokalmeia: 3.6<--3.1<--2.9<--3.4<--3.6 - getting kdur 40mg  BID  HTN: Well controlled.  - given hypokalemia, was taken off home chlorthalidone 12.5mg  daily  - was restarted on losartan 100mg  daily (which had been d/c in setting of IV contrast from CTA 8/30)  DM2: with peripheral neuropathy: last hgb A1c 7.8 02/02/2014  - SSI, Accuchecks Q4hr  - hold home metformin 1000mg  BID   Depression  - continue citalopram 20mg  daily   FEN  - Carb mod diet - NS@75ml /hr   DVT ppx: SCDs  Dispo: Disposition is deferred at this time, awaiting improvement of current medical problems.  Anticipated discharge in approximately 0 day(s).   The patient does have a current PCP Francesca Oman, DO) and does need an [facility] hospital follow-up appointment after discharge.  The patient does not have transportation limitations that hinder transportation to clinic appointments.  .Services Needed at time of discharge: Y = Yes, Blank = No PT:   OT:   RN:   Equipment:   Other:     LOS: 6 days   Drucilla Schmidt, MD 06/30/2014, 7:24 AM

## 2014-06-30 NOTE — Discharge Summary (Signed)
INTERNAL MEDICINE ATTENDING DISCHARGE COSIGN   I evaluated the patient on the day of discharge and discussed the discharge plan with my resident team. I agree with the discharge documentation and disposition.   Marwah Disbro 06/30/2014, 1:25 PM

## 2014-06-30 NOTE — Discharge Instructions (Signed)
Stop taking your chlorthalidone (one of your blood pressure medications). Your primary care doctor will reassess your blood pressure at your next appointment.  Take your valacyclovir (Valtrex) three times a day for the next 10 days.  If your headache gets suddenly worse, if light starts bothering you a lot, or if you start getting fevers, please come back to the hospital.  Viral Meningitis Meningitis is an inflammation of the fluid and membranes surrounding the spinal cord and brain. Viral meningitis is caused by a viral infection. It is important to know whether a virus or bacterium is causing your meningitis. Viral meningitis is generally less severe than bacterial meningitis. Aseptic meningitis is any meningitis not caused by a bacterial infection, including viral meningitis. Meningitis can also be caused by fungi, parasites, certain medicines, cancer, and autoimmune disorders. Uncomplicated meningitis usually resolves on its own in 7 to 10 days.However, there can be complicated cases that last much longer. People with lowered immune systems are at greater risk for poor outcomes from meningitis.It is important to get treatment as soon as possible to minimize the impact of a meningitis infection. Long-term complications could include seizures, hearing loss, weakness, paralysis, blindness, or cognitive impairment. CAUSES Most cases of meningitis are due to viruses. Viruses that cause meningitis include enteroviruses (such as polio and coxsackie viruses), herpes simplex virus, varicella zoster virus, mumps, and HIV. SYMPTOMS  Symptoms can develop over many hours. They may even take a few days to develop. Common symptoms of meningitis in people over the age of 2 years include:  High fever.  Headache.  Stiff neck.  Irritability.  Nausea and vomiting.  Fatigue.  Discomfort from exposure to light.  Discomfort from exposure to loud noise.  Trouble walking.  Altered mental  status.  Seizures. DIAGNOSIS  Early diagnosis and treatment are very important. If you have symptoms, you should see your caregiver right away. Your evaluation may include lab tests of your blood and spinal fluid. A spinal fluid sample is taken through a procedure called a lumbar puncture or spinal tap. During the procedure, a needle is inserted into an area in the lower back. You may also have a CT scan of your brain as part of your evaluation.If there is suspicion of an infection or inflammation of the brain (encephalitis), an MRI scan may be done. TREATMENT   Antibiotics are not effective against a virus. Antiviral medicines may be used depending on the cause of your meningitis.  Treatment for viral meningitis focuses on reducing your symptoms. This may include rest, hydration, and medicines to reduce fever and pain.  Steroids may also be used if there is significant swelling of the brain. PREVENTION  Vaccines can help prevent meningitis caused by polio, measles, and mumps.  Strict hand washing is effective in controlling the spread of many causes of meningitis.  Using barrier protection during sexual intercourse can prevent the spread of meningitis caused by viruses such as the herpes virus or HIV.  Protection from mosquitoes, especially for the very young and very old, can prevent some causes of meningitis. HOME CARE INSTRUCTIONS  Rest.  Drink enough water and fluids to keep your urine clear or pale yellow.  Take all medicine as prescribed.  Practice good hygiene to prevent others from getting sick.  Follow up with your caregiver as directed. SEEK IMMEDIATE MEDICAL CARE IF:  You develop dizziness.  You have a very fast heartbeat.  You have difficulty breathing.  You develop confusion.  You have seizures.  You  have unstoppable nausea and vomiting.  You develop any worsening symptoms. MAKE SURE YOU:  Understand these instructions.  Will watch your  condition.  Will get help right away if you are not doing well or get worse. Document Released: 10/04/2002 Document Revised: 01/06/2012 Document Reviewed: 01/29/2011 [facility] Patient Information 2015 [place], [place]. This information is not intended to replace advice given to you by your health care provider. Make sure you discuss any questions you have with your health care provider.

## 2014-06-30 NOTE — Progress Notes (Signed)
Utilization review completed.  

## 2014-06-30 NOTE — Progress Notes (Signed)
Pt seen and examined with Dr. Sherrine Maples and Dr. Eulas Post. Please refer to resident note for details   Pt states headache has resolved today. No fevers. Pt complains of mild neck pain - left sided and occurs on moving her neck in any direction but worse towards the left   Exam:  Neuro- no focal deficits, able to touch chin to chest with minimal pain Cardio- RRR, normal heart sounds  Lungs- CTA b/l  Abd- soft, non tender, non distended, BS +  Ext- no pedal edema  Gen- AAO*3, NAD  Assessment and Plan:  Please refer to resident note for detailed plan   - Pt with HSV meningitis  - No fevers today. Pt improved - d/c acyclovir. Start PO valacyclovir to complete 14 day course - Blood cx, urine cx negative till date  - Pt stable for d/c home today  - Pt with hypokalemia. Resolving now off chlorthalidone. Would check BMP on outpatient follow up - c/w losartan for BP control. May need to add norvasc if BP elevated as outpatient but currently BP at goal  - Case d/w patient in detail. She is in agreement with plan

## 2014-07-01 LAB — CULTURE, BLOOD (ROUTINE X 2)
Culture: NO GROWTH
Culture: NO GROWTH

## 2014-07-04 LAB — CULTURE, BLOOD (ROUTINE X 2)
CULTURE: NO GROWTH
CULTURE: NO GROWTH

## 2014-07-06 ENCOUNTER — Other Ambulatory Visit: Payer: Self-pay | Admitting: *Deleted

## 2014-07-06 ENCOUNTER — Telehealth: Payer: Self-pay | Admitting: *Deleted

## 2014-07-06 NOTE — Telephone Encounter (Signed)
Can you increase # from 10 to 90 or 120?

## 2014-07-06 NOTE — Telephone Encounter (Signed)
Call to patient to ask about her pain -location and level.  Unable to reach patient per phone and unable to leave message as message line is full.  Sander Nephew, RN 07/06/2014 1:43 PM.

## 2014-07-07 NOTE — Telephone Encounter (Signed)
I HAVE ATTEMPTED TO CALL PT THIS MORNING, WAS UNABLE to leave message but i did leave a callback #

## 2014-07-08 ENCOUNTER — Other Ambulatory Visit: Payer: Self-pay | Admitting: *Deleted

## 2014-07-08 MED ORDER — TRAMADOL HCL 50 MG PO TABS: 50.0000 mg | ORAL_TABLET | Freq: Three times a day (TID) | ORAL | Status: DC | PRN

## 2014-07-08 NOTE — Telephone Encounter (Signed)
Pt # [phone] Pt is having back pain in the area's where she had several injections.

## 2014-07-08 NOTE — Telephone Encounter (Signed)
I will supply her with a small amount of Tramadol to last until her appointment with me on 09/16.

## 2014-07-08 NOTE — Telephone Encounter (Signed)
Rx called in 

## 2014-07-13 ENCOUNTER — Ambulatory Visit (INDEPENDENT_AMBULATORY_CARE_PROVIDER_SITE_OTHER): Payer: Self-pay | Admitting: Internal Medicine

## 2014-07-13 ENCOUNTER — Encounter: Payer: Self-pay | Admitting: Internal Medicine

## 2014-07-13 VITALS — BP 156/66 | HR 72 | Temp 97.9°F | Ht 63.0 in | Wt 216.7 lb

## 2014-07-13 DIAGNOSIS — E119 Type 2 diabetes mellitus without complications: Secondary | ICD-10-CM

## 2014-07-13 DIAGNOSIS — I10 Essential (primary) hypertension: Secondary | ICD-10-CM

## 2014-07-13 DIAGNOSIS — A879 Viral meningitis, unspecified: Secondary | ICD-10-CM

## 2014-07-13 LAB — BASIC METABOLIC PANEL WITH GFR
BUN: 14 mg/dL (ref 6–23)
CALCIUM: 9.5 mg/dL (ref 8.4–10.5)
CO2: 23 mEq/L (ref 19–32)
CREATININE: 0.64 mg/dL (ref 0.50–1.10)
Chloride: 100 mEq/L (ref 96–112)
Glucose, Bld: 76 mg/dL (ref 70–99)
Potassium: 3.9 mEq/L (ref 3.5–5.3)
Sodium: 138 mEq/L (ref 135–145)

## 2014-07-13 LAB — GLUCOSE, CAPILLARY: GLUCOSE-CAPILLARY: 86 mg/dL (ref 70–99)

## 2014-07-13 NOTE — Patient Instructions (Signed)
1. Please return to see me in 1 month for follow-up.   2. Please take all medications as prescribed.    3. If you have worsening of your symptoms or new symptoms arise, please call the clinic ([phone]), or go to the ER immediately if symptoms are severe.  Viral Meningitis Meningitis is an inflammation of the fluid and membranes surrounding the spinal cord and brain. Viral meningitis is caused by a viral infection. It is important to know whether a virus or bacterium is causing your meningitis. Viral meningitis is generally less severe than bacterial meningitis. Aseptic meningitis is any meningitis not caused by a bacterial infection, including viral meningitis. Meningitis can also be caused by fungi, parasites, certain medicines, cancer, and autoimmune disorders. Uncomplicated meningitis usually resolves on its own in 7 to 10 days.However, there can be complicated cases that last much longer. People with lowered immune systems are at greater risk for poor outcomes from meningitis.It is important to get treatment as soon as possible to minimize the impact of a meningitis infection. Long-term complications could include seizures, hearing loss, weakness, paralysis, blindness, or cognitive impairment. CAUSES Most cases of meningitis are due to viruses. Viruses that cause meningitis include enteroviruses (such as polio and coxsackie viruses), herpes simplex virus, varicella zoster virus, mumps, and HIV. SYMPTOMS  Symptoms can develop over many hours. They may even take a few days to develop. Common symptoms of meningitis in people over the age of 2 years include:  High fever.  Headache.  Stiff neck.  Irritability.  Nausea and vomiting.  Fatigue.  Discomfort from exposure to light.  Discomfort from exposure to loud noise.  Trouble walking.  Altered mental status.  Seizures. DIAGNOSIS  Early diagnosis and treatment are very important. If you have symptoms, you should see your  caregiver right away. Your evaluation may include lab tests of your blood and spinal fluid. A spinal fluid sample is taken through a procedure called a lumbar puncture or spinal tap. During the procedure, a needle is inserted into an area in the lower back. You may also have a CT scan of your brain as part of your evaluation.If there is suspicion of an infection or inflammation of the brain (encephalitis), an MRI scan may be done. TREATMENT   Antibiotics are not effective against a virus. Antiviral medicines may be used depending on the cause of your meningitis.  Treatment for viral meningitis focuses on reducing your symptoms. This may include rest, hydration, and medicines to reduce fever and pain.  Steroids may also be used if there is significant swelling of the brain. PREVENTION  Vaccines can help prevent meningitis caused by polio, measles, and mumps.  Strict hand washing is effective in controlling the spread of many causes of meningitis.  Using barrier protection during sexual intercourse can prevent the spread of meningitis caused by viruses such as the herpes virus or HIV.  Protection from mosquitoes, especially for the very young and very old, can prevent some causes of meningitis. HOME CARE INSTRUCTIONS  Rest.  Drink enough water and fluids to keep your urine clear or pale yellow.  Take all medicine as prescribed.  Practice good hygiene to prevent others from getting sick.  Follow up with your caregiver as directed. SEEK IMMEDIATE MEDICAL CARE IF:  You develop dizziness.  You have a very fast heartbeat.  You have difficulty breathing.  You develop confusion.  You have seizures.  You have unstoppable nausea and vomiting.  You develop any worsening symptoms. MAKE SURE  YOU:  Understand these instructions.  Will watch your condition.  Will get help right away if you are not doing well or get worse. Document Released: 10/04/2002 Document Revised: 01/06/2012  Document Reviewed: 01/29/2011 [facility] Patient Information 2015 [place], [place]. This information is not intended to replace advice given to you by your health care provider. Make sure you discuss any questions you have with your health care provider.

## 2014-07-13 NOTE — Progress Notes (Signed)
   Subjective:    Patient ID: Sherri Gill, female    DOB: 1951-02-05, 63 y.o.   MRN: [id number]  HPI Comments: [name] is a 63 year old woman with a PMH of DM type 2 (Hgb A1c 7.02 Jun 2014), HTN, GERD and insomnia presenting for hospital follow-up after admission for viral meningitis.  She feels much better than during admission.  Her headache and blurry vision are improving, she is not sensitive to light.  She is able to drive without problem.  She is taking acyclovir (cheaper than Valtrex) and has two pills left.       Review of Systems  Constitutional: Positive for fever and fatigue. Negative for appetite change.  Eyes: Negative for photophobia and visual disturbance.  Respiratory: Negative for shortness of breath.   Cardiovascular: Negative for chest pain.  Neurological: Positive for headaches.       Objective:   Physical Exam  Vitals reviewed. Constitutional: She is oriented to person, place, and time. She appears well-developed. No distress.  HENT:  Head: Normocephalic and atraumatic.  Mouth/Throat: Oropharynx is clear and moist. No oropharyngeal exudate.  Eyes: EOM are normal. Pupils are equal, round, and reactive to light.  Neck: Normal range of motion. Neck supple.  Cardiovascular: Normal rate and regular rhythm.  Exam reveals no gallop and no friction rub.   No murmur heard. Pulmonary/Chest: Effort normal and breath sounds normal. No respiratory distress. She has no wheezes. She has no rales.  Abdominal: Soft. Bowel sounds are normal. She exhibits no distension. There is no tenderness.  Musculoskeletal: Normal range of motion. She exhibits no edema and no tenderness.  Neurological: She is alert and oriented to person, place, and time. No cranial nerve deficit.  Skin: Skin is warm. She is not diaphoretic.  Psychiatric: She has a normal mood and affect. Her behavior is normal.          Assessment & Plan:  Please see problem based assessment and plan.

## 2014-07-14 LAB — LIPID PANEL
Cholesterol: 194 mg/dL (ref 0–200)
HDL: 61 mg/dL (ref >39)
LDL Cholesterol: 105 mg/dL — ABNORMAL HIGH (ref 0–99)
Total CHOL/HDL Ratio: 3.2 Ratio
Triglycerides: 138 mg/dL (ref <150)
VLDL: 28 mg/dL (ref 0–40)

## 2014-07-14 MED ORDER — CHLORTHALIDONE 25 MG PO TABS: 12.5000 mg | ORAL_TABLET | Freq: Every day | ORAL | Status: DC

## 2014-07-14 NOTE — Assessment & Plan Note (Signed)
BP Readings from Last 3 Encounters:  07/13/14 156/66  06/30/14 131/64  02/09/14 145/62    Lab Results  Component Value Date   NA 138 07/13/2014   K 3.9 07/13/2014   CREATININE 0.64 07/13/2014    Assessment: Blood pressure control:  slightly elevated Progress toward BP goal:   not at goal Comments: Chlorthalidone was discontinued during admission due to hypokalemia.  She has continued losartan.  Plan: Medications:  continue current medications:  Losartan 100mg  daily.  RESUME chlorthalidone 12.5mg  daily. Educational resources provided: brochure Other plans: BMP this visit with K of 3.9.  Will resume low dose chlorthalidone as BP slightly increased and acute illness resolving.  Will have her return for BMP/BP check in 2 weeks.

## 2014-07-14 NOTE — Progress Notes (Signed)
INTERNAL MEDICINE TEACHING ATTENDING ADDENDUM - Katalyn Matin, MD: I reviewed and discussed at the time of visit with the resident Dr. Wilson, the patient's medical history, physical examination, diagnosis and results of pertinent tests and treatment and I agree with the patient's care as documented.  

## 2014-07-14 NOTE — Assessment & Plan Note (Signed)
She feels better, symptoms are improving, she is afebrile on exam and able to move her neck.  No photophobia.  She agrees to complete course of anti-virals (two pills remaining).  She was advised to call clinic or seek emergency help if original symptoms return.

## 2014-07-15 ENCOUNTER — Telehealth: Payer: Self-pay | Admitting: *Deleted

## 2014-07-15 NOTE — Telephone Encounter (Signed)
Attempts to call patient x 2.  Unable to leave message on home number.   Pt will need to resume Chlorthalidone 12.5 mg tablets once daily as before hospitalization.  Sander Nephew, RN 07/15/2014 4:55 PM.

## 2014-07-18 ENCOUNTER — Telehealth: Payer: Self-pay | Admitting: *Deleted

## 2014-07-18 NOTE — Telephone Encounter (Signed)
Pt called still not feeling well - problems with neck pain and SOB with activity. Appt 07/19/14 3:15PM Dr Ronnald Ramp. Hilda Blades Angelice Piech RN 07/18/14 4:30PM

## 2014-07-19 ENCOUNTER — Ambulatory Visit (INDEPENDENT_AMBULATORY_CARE_PROVIDER_SITE_OTHER): Payer: Self-pay | Admitting: Internal Medicine

## 2014-07-19 ENCOUNTER — Encounter: Payer: Self-pay | Admitting: Internal Medicine

## 2014-07-19 VITALS — BP 159/63 | HR 79 | Temp 97.8°F | Ht 63.0 in

## 2014-07-19 DIAGNOSIS — A879 Viral meningitis, unspecified: Secondary | ICD-10-CM

## 2014-07-19 DIAGNOSIS — I10 Essential (primary) hypertension: Secondary | ICD-10-CM

## 2014-07-19 NOTE — Patient Instructions (Signed)
1. Please schedule a follow up appointment for 2 weeks.  2. Please take all medications as prescribed. Start taking Chlorthalidone 12.5 DAILY.   3. If you have worsening of your symptoms or new symptoms arise, please call the clinic ([phone]), or go to the ER immediately if symptoms are severe.  You have done a great job in taking all your medications. I appreciate it very much. Please continue doing that.

## 2014-07-19 NOTE — Progress Notes (Signed)
Subjective:   Patient ID: Sherri Gill female   DOB: [birth year] 63 y.o.   MRN: [id number]  HPI: Sherri Gill is a 63 y.o. female w/ PMHx of HTN, HLD, DM type II, GERD, and depression, presents to the clinic today w/ complaints of ongoing neck pain and malaise. Patient was recently seen in the clinic by Dr. Redmond Pulling on 07/14/14 for hospital follow up. She had been discharged from [facility] on 06/29/14 after a viral meningitis. She had previously completed a course of Acyclovir. Over the past week or so she has had a persistent mild, dull headache, mild neck stiffness, and malaise. She claims it has not gotten worse since her last clinic visit, she simply says it has not improved like she was expecting. She denies any recent fever, chills, nausea, vomiting, change in vision, confusion, or mental disturbances. She says she has been very worried about illness since her recent hospitalization and stated that having a viral meningitis was very scary for her. She claims she has been somewhat tired w/ normal activity and claims that things that were normal for her before her hospitalizations are slightly more difficult and she is tired more recently. No further complaints.   Past Medical History  Diagnosis Date  . Depression   . Chronic anemia 08/29/2012    Need colonoscopy or report. Need iron panel.   . Dyslipidemia 06/30/2007  . Essential hypertension, benign 06/30/2007  . Type 2 diabetes mellitus, uncontrolled, with neuropathy 06/30/2007  . GERD 06/30/2007  . Insomnia 06/11/2012  . Peripheral neuropathy 12/28/2009  . Obesity, BMI 35-40 06/11/2012   Current Outpatient Prescriptions  Medication Sig Dispense Refill  . acetaminophen (TYLENOL) 500 MG tablet Take 2 tablets (1,000 mg total) by mouth every 8 (eight) hours as needed.  60 tablet  0  . aspirin (ANACIN) 81 MG EC tablet Take 81 mg by mouth daily.        . Biotin 5000 MCG CAPS Take 10,000 mcg by mouth every morning.      . Chlorphen-Phenyleph-ASA  (ALKA-SELTZER PLUS COLD) 2-7.8-325 MG TBEF Take 1 tablet by mouth daily as needed (for cold).      . chlorthalidone (HYGROTON) 25 MG tablet Take 0.5 tablets (12.5 mg total) by mouth daily.  30 tablet  3  . citalopram (CELEXA) 20 MG tablet Take 1 tablet (20 mg total) by mouth daily.  90 tablet  1  . ibuprofen (ADVIL,MOTRIN) 800 MG tablet Take 1 tablet (800 mg total) by mouth every 8 (eight) hours as needed.  30 tablet  0  . losartan (COZAAR) 100 MG tablet Take 1 tablet (100 mg total) by mouth daily.  30 tablet  5  . metFORMIN (GLUCOPHAGE) 1000 MG tablet Take 1,000 mg by mouth 2 (two) times daily with a meal.      . pravastatin (PRAVACHOL) 40 MG tablet Take 1 tablet (40 mg total) by mouth daily.  90 tablet  3  . psyllium (METAMUCIL) 58.6 % packet Take 1 packet by mouth daily as needed (for fiber supplement).      . traMADol (ULTRAM) 50 MG tablet Take 1 tablet (50 mg total) by mouth every 8 (eight) hours as needed for moderate pain.  20 tablet  0   No current facility-administered medications for this visit.    Review of Systems: General: Positive for fatigue. Denies fever, chills, diaphoresis, appetite change.  Respiratory: Denies SOB, DOE, cough, and wheezing.   Cardiovascular: Denies chest pain and palpitations.  Gastrointestinal: Denies  nausea, vomiting, abdominal pain, and diarrhea.  Genitourinary: Denies dysuria, increased frequency, and flank pain. Endocrine: Denies hot or cold intolerance, polyuria, and polydipsia. Musculoskeletal: Positive for mild lumbar back pain and mild neck pain. Denies myalgias, joint swelling, arthralgias and gait problem.  Skin: Denies pallor, rash and wounds.  Neurological: Positive for mild dull headache. Denies dizziness, seizures, syncope, weakness, lightheadedness, numbness.  Psychiatric/Behavioral: Denies mood changes, and sleep disturbances.  Objective:   Physical Exam: Filed Vitals:   07/19/14 1539  BP: 159/63  Pulse: 79  Temp: 97.8 F (36.6 C)    TempSrc: Oral  Height: 5\' 3"  (1.6 m)  SpO2: 99%   Physical Exam: General: Obese AA female, alert, cooperative, NAD. Not ill appearing.  HEENT: PERRL, EOMI. Moist mucus membranes Neck: Full range of motion without pain, supple, no lymphadenopathy or carotid bruits. Negative Kernig's/Brudzinsky's sign. Chin to chest w/out discomfort.  Lungs: Clear to ascultation bilaterally, normal work of respiration, no wheezes, rales, rhonchi Heart: RRR, no murmurs, gallops, or rubs Abdomen: Soft, non-tender, non-distended, BS + Extremities: No cyanosis, clubbing, or edema. Neurologic: Alert & oriented X3, cranial nerves II-XII intact, strength grossly intact, sensation intact to light touch  Assessment & Plan:   Please see problem based assessment and plan.

## 2014-07-20 ENCOUNTER — Encounter: Payer: Self-pay | Admitting: Internal Medicine

## 2014-07-20 NOTE — Progress Notes (Signed)
INTERNAL MEDICINE TEACHING ATTENDING ADDENDUM - Tuyet Bader, MD: I reviewed and discussed at the time of visit with the resident Dr. Jones, the patient's medical history, physical examination, diagnosis and results of pertinent tests and treatment and I agree with the patient's care as documented.  

## 2014-07-20 NOTE — Assessment & Plan Note (Signed)
Patient w/ recent hospitalization for suspected HSV meningitis, treated w/ full course of Acyclovir. Still w/ mild headache, fatigue, and neck stiffness. No significant findings on exam, do not suspect she has a recurrent infection or continued/worsened meningeal inflammation. Discussed w/ her at length as she seemed to be very scared by the fact that she had a viral meningitis. Most of her symptoms seem to be 2/2 a resolving process and have only improved since her recent discharge (06/29/14). She does not describe a fever, chills, or nausea. Patient is able to touch chin to chest, and has negative Kernig's and Brudzinsky's sign. Patient also w/ elevated BP on exam, has not been taking Chlorthalidone as instructed at previous visit. Suspect HA may be also related to elevated BP.  -Told patient to return to ED or call clinic if symptoms worsen or she has a fever at home.

## 2014-07-20 NOTE — Assessment & Plan Note (Signed)
BP Readings from Last 3 Encounters:  07/19/14 159/63  07/13/14 156/66  06/30/14 131/64    Lab Results  Component Value Date   NA 138 07/13/2014   K 3.9 07/13/2014   CREATININE 0.64 07/13/2014    Assessment: Patient currently on Losartan 100 mg po qd. BMP was repeated at previous visit and K found to be 3.9. Patient was instructed to restart Chlorthalidone 12.5 mg qd for better BP control, however, she has not restarted this medication, explaining mildly elevated BP. May also be associated w/ mild dull headache the patient is describing.   Plan: Medications:  continue current medications; restart Chlorthalidone 12.5 mg qd.  Educational resources provided: brochure Self management tools provided:   Other plans: Recheck BMP at next visit. If hypokalemic, d/c Chlorthalidone, start HCTZ instead.

## 2014-07-26 ENCOUNTER — Encounter: Payer: Self-pay | Admitting: *Deleted

## 2014-07-28 ENCOUNTER — Encounter: Payer: Self-pay | Admitting: *Deleted

## 2014-07-28 ENCOUNTER — Encounter: Payer: Self-pay | Admitting: Internal Medicine

## 2014-08-03 ENCOUNTER — Encounter: Payer: Self-pay | Admitting: Internal Medicine

## 2014-08-03 ENCOUNTER — Ambulatory Visit (INDEPENDENT_AMBULATORY_CARE_PROVIDER_SITE_OTHER): Payer: Self-pay | Admitting: Internal Medicine

## 2014-08-03 VITALS — BP 137/50 | HR 89 | Temp 98.0°F | Ht 63.0 in

## 2014-08-03 DIAGNOSIS — E785 Hyperlipidemia, unspecified: Secondary | ICD-10-CM

## 2014-08-03 DIAGNOSIS — Z1239 Encounter for other screening for malignant neoplasm of breast: Secondary | ICD-10-CM

## 2014-08-03 DIAGNOSIS — I10 Essential (primary) hypertension: Secondary | ICD-10-CM

## 2014-08-03 DIAGNOSIS — E1165 Type 2 diabetes mellitus with hyperglycemia: Secondary | ICD-10-CM

## 2014-08-03 DIAGNOSIS — A879 Viral meningitis, unspecified: Secondary | ICD-10-CM

## 2014-08-03 DIAGNOSIS — E118 Type 2 diabetes mellitus with unspecified complications: Secondary | ICD-10-CM

## 2014-08-03 DIAGNOSIS — M545 Low back pain, unspecified: Secondary | ICD-10-CM

## 2014-08-03 DIAGNOSIS — E1140 Type 2 diabetes mellitus with diabetic neuropathy, unspecified: Secondary | ICD-10-CM

## 2014-08-03 DIAGNOSIS — Z0000 Encounter for general adult medical examination without abnormal findings: Secondary | ICD-10-CM

## 2014-08-03 LAB — GLUCOSE, CAPILLARY: Glucose-Capillary: 137 mg/dL — ABNORMAL HIGH (ref 70–99)

## 2014-08-03 NOTE — Progress Notes (Signed)
   Subjective:    Patient ID: Sherri Gill, female    DOB: Sep 06, 1951, 63 y.o.   MRN: [id number]  HPI Comments: Sherri Gill is a 63 year old woman with a PMH of DM type 2 (Hgb A1c 7.02 Jun 2014), HTN, GERD and insomnia here for follow-up.  Please see problem based assessment and plan for update of chronic medical conditions.   She was recently treated for viral meningitis and reports low back pain with standing at site of lumbar puncture last month.  She has no difficulty with walking, no saddle anesthesia or incontinence.  It is not worsening but she does not feel it is improving.  Headache and neck pain symptoms are improving, now 6/10.  No visual changes or fevers.  She is requesting I fill out paperwork so she can return to work with limited hours (she says she can't stand for 8 hour shift).  She would like to start with four hours.       Review of Systems  Constitutional: Negative for fever, appetite change and unexpected weight change.  Eyes: Negative for visual disturbance.  Respiratory: Negative for shortness of breath.   Cardiovascular: Negative for chest pain.  Gastrointestinal: Negative for nausea, vomiting, abdominal pain, diarrhea and constipation.  Genitourinary: Negative for dysuria.  Musculoskeletal: Positive for back pain. Negative for gait problem.  Neurological: Positive for headaches. Negative for weakness and light-headedness.       Objective:   Physical Exam  Vitals reviewed. Constitutional: She is oriented to person, place, and time. She appears well-developed. No distress.  HENT:  Head: Normocephalic and atraumatic.  Mouth/Throat: Oropharynx is clear and moist. No oropharyngeal exudate.  Eyes: EOM are normal. Pupils are equal, round, and reactive to light.  There is no photophobia.  Neck: Normal range of motion. Neck supple.  She is able to touch chin to chest.   Cardiovascular: Normal rate, regular rhythm and normal heart sounds.  Exam reveals no gallop and  no friction rub.   No murmur heard. Pulmonary/Chest: Breath sounds normal. No respiratory distress. She has no wheezes. She has no rales.  Abdominal: Soft. Bowel sounds are normal. She exhibits no distension. There is no tenderness. There is no rebound.  Musculoskeletal: Normal range of motion. She exhibits no edema and no tenderness.  Minimal TTP medially near L3-L4.  There is no overlying warmth, erythema or swelling.  She is not limited in ROM.  Lower extremity muscle strength is normal.  Negative straight leg raise.   Neurological: She is alert and oriented to person, place, and time. No cranial nerve deficit. Coordination normal.  Strength and sensation are equal and intact B/L upper and lower extremities.  Brudzinski and Kernig negative.  Skin: Skin is warm. She is not diaphoretic.  Psychiatric: She has a normal mood and affect. Her behavior is normal.          Assessment & Plan:  Please see problem based assessment and plan.

## 2014-08-04 DIAGNOSIS — M545 Low back pain, unspecified: Secondary | ICD-10-CM | POA: Insufficient documentation

## 2014-08-04 LAB — MICROALBUMIN / CREATININE URINE RATIO
CREATININE, URINE: 99.9 mg/dL
MICROALB UR: 1.4 mg/dL (ref <2.0)
Microalb Creat Ratio: 14 mg/g (ref 0.0–30.0)

## 2014-08-04 MED ORDER — ATORVASTATIN CALCIUM 80 MG PO TABS: 80.0000 mg | ORAL_TABLET | Freq: Every day | ORAL | Status: DC

## 2014-08-04 NOTE — Assessment & Plan Note (Addendum)
10-year ASCVD risk based upon recent lipid panel is 18%.  Will stop pravastatin and start atorvastatin 80mg .

## 2014-08-04 NOTE — Assessment & Plan Note (Signed)
Lab Results  Component Value Date   HGBA1C 7.6* 06/25/2014   HGBA1C 7.8 02/02/2014   HGBA1C 6.9 10/12/2013     Assessment: Diabetes control:  uncontrolled Progress toward A1C goal:   improving Comments: She will be due for check of A1c at the end of November.    Plan: Medications:  continue current medications:  Metformin 1000mg  BID.  Can consider additional oral medication if she is still not at goal. Other plans: Check A1c in 7-8 weeks.

## 2014-08-04 NOTE — Patient Instructions (Signed)
1. You can try NSAIDs (take as directed and with food) or Tylenol as need for pain.  Also try heat.  Please return to clinic in 1 month for follow-up of your back pain.    2. Please take all medications as prescribed.    3. If you have worsening of your symptoms or new symptoms arise, please call the clinic ([phone]), or go to the ER immediately if symptoms are severe.

## 2014-08-04 NOTE — Assessment & Plan Note (Signed)
The patient was referred for mammography at [facility] (she can utilize scholarship).  She is uninsured and is planning to apply for [employer].  Once she obtains [employer] can refer her to GI for colonoscopy.  She will need to return to clinic for PAP.

## 2014-08-04 NOTE — Assessment & Plan Note (Addendum)
She was discharged 1 month ago after admission for viral meningitis but continues to experience occasional headache and low back pain.  Her headaches are less intense and occuring less frequently.  She denies fevers/chills, change in vision or weakness to suggest subsequent infection.  She feels her low back pain has been present only since admission and is located at the site of LP.  There are no neurologic deficits on exam, she has no meningeal signs, she is afebrile and wants to return to work so she seems to be improving.  She was advised to call clinic or seek emergency help if she had re-emergence of original symptoms.

## 2014-08-04 NOTE — Assessment & Plan Note (Signed)
BP Readings from Last 3 Encounters:  08/03/14 137/50  07/19/14 159/63  07/13/14 156/66    Lab Results  Component Value Date   NA 138 07/13/2014   K 3.9 07/13/2014   CREATININE 0.64 07/13/2014    Assessment: Blood pressure control:  well controlled Progress toward BP goal:    at goal   Plan: Medications:  continue current medications:  Losartan 100mg  daily and chlorthalidone 12.5mg  daily Educational resources provided: brochure

## 2014-08-04 NOTE — Assessment & Plan Note (Signed)
She reports low back pain at site of lumbar puncture that has been present since admission 1 month ago.  She has no radicular pain, motor or sensory deficits or bowel/bladder incontinence making rare LP complication like spinal hematoma unlikely.  This may be due to local irritation of soft tissue that is persisting for extended time.  The pain responds to medications and rest.  It is not waking her from sleep.   - since it has only been a few weeks I will advise activity as tolerated (including returning to work with four hour duty restriction for the next 3 weeks), prn NSAID or Tylenol for pain, heat to the affected area - she was advised to seek emergency help if she develops numbness, weakness, loss of bowel/bladder function or sever headache pain - she agrees to the plan and will return in 4 weeks for follow-up; at that time, if symptoms persists I will consider imaging

## 2014-08-08 NOTE — Progress Notes (Signed)
Case discussed with Dr. Wilson at the time of the visit.  We reviewed the resident's history and exam and pertinent patient test results.  I agree with the assessment, diagnosis and plan of care documented in the resident's note. 

## 2014-09-16 ENCOUNTER — Other Ambulatory Visit: Payer: Self-pay | Admitting: Internal Medicine

## 2014-09-20 ENCOUNTER — Other Ambulatory Visit: Payer: Self-pay | Admitting: *Deleted

## 2014-09-21 MED ORDER — METFORMIN HCL 1000 MG PO TABS: 1000.0000 mg | ORAL_TABLET | Freq: Two times a day (BID) | ORAL | Status: DC

## 2015-01-05 ENCOUNTER — Encounter: Payer: Self-pay | Admitting: *Deleted

## 2015-01-16 ENCOUNTER — Other Ambulatory Visit: Payer: Self-pay | Admitting: Internal Medicine

## 2015-01-16 DIAGNOSIS — I10 Essential (primary) hypertension: Secondary | ICD-10-CM

## 2015-02-13 ENCOUNTER — Other Ambulatory Visit: Payer: Self-pay | Admitting: Internal Medicine

## 2015-03-16 ENCOUNTER — Other Ambulatory Visit: Payer: Self-pay | Admitting: Internal Medicine

## 2015-04-26 ENCOUNTER — Encounter: Payer: Self-pay | Admitting: Internal Medicine

## 2015-04-26 ENCOUNTER — Ambulatory Visit (INDEPENDENT_AMBULATORY_CARE_PROVIDER_SITE_OTHER): Payer: Self-pay | Admitting: Internal Medicine

## 2015-04-26 VITALS — BP 131/58 | HR 86 | Temp 97.9°F | Ht 63.0 in | Wt 215.3 lb

## 2015-04-26 DIAGNOSIS — Z1239 Encounter for other screening for malignant neoplasm of breast: Secondary | ICD-10-CM

## 2015-04-26 DIAGNOSIS — Z79899 Other long term (current) drug therapy: Secondary | ICD-10-CM

## 2015-04-26 DIAGNOSIS — I10 Essential (primary) hypertension: Secondary | ICD-10-CM

## 2015-04-26 DIAGNOSIS — IMO0002 Reserved for concepts with insufficient information to code with codable children: Secondary | ICD-10-CM

## 2015-04-26 DIAGNOSIS — E1165 Type 2 diabetes mellitus with hyperglycemia: Secondary | ICD-10-CM

## 2015-04-26 DIAGNOSIS — E1140 Type 2 diabetes mellitus with diabetic neuropathy, unspecified: Secondary | ICD-10-CM

## 2015-04-26 DIAGNOSIS — Z0000 Encounter for general adult medical examination without abnormal findings: Secondary | ICD-10-CM

## 2015-04-26 LAB — GLUCOSE, CAPILLARY: Glucose-Capillary: 160 mg/dL — ABNORMAL HIGH (ref 65–99)

## 2015-04-26 LAB — POCT GLYCOSYLATED HEMOGLOBIN (HGB A1C): Hemoglobin A1C: 7.4

## 2015-04-26 MED ORDER — CITALOPRAM HYDROBROMIDE 20 MG PO TABS: 20.0000 mg | ORAL_TABLET | Freq: Every day | ORAL | Status: DC

## 2015-04-26 MED ORDER — CHLORTHALIDONE 25 MG PO TABS: 12.5000 mg | ORAL_TABLET | Freq: Every day | ORAL | Status: DC

## 2015-04-26 MED ORDER — METFORMIN HCL 1000 MG PO TABS: 1000.0000 mg | ORAL_TABLET | Freq: Two times a day (BID) | ORAL | Status: DC

## 2015-04-26 NOTE — Patient Instructions (Signed)
1.  Please return to clinic next month for PAP smear.  We will call you to arrange mammogram.     2. Please take all medications as prescribed.    3. If you have worsening of your symptoms or new symptoms arise, please call the clinic ([phone]), or go to the ER immediately if symptoms are severe.

## 2015-04-26 NOTE — Assessment & Plan Note (Signed)
Lab Results  Component Value Date   HGBA1C 7.4 04/26/2015   HGBA1C 7.6* 06/25/2014   HGBA1C 7.8 02/02/2014     Assessment: Diabetes control:  improving Progress toward A1C goal:   improving Comments: Reports compliance with metformin.  Did not bring meter but report AM fasting values mostly in the 100s.  No hypolgycemia.    Plan: Medications:  continue current medications:  Metformin 1000mg  BID Home glucose monitoring:  daily Frequency:   Timing:   Instruction/counseling given: reminded to bring blood glucose meter & log to each visit Educational resources provided: brochure Self management tools provided: copy of home glucose meter download Other plans: Advised adding light exercise such as walking.

## 2015-04-26 NOTE — Assessment & Plan Note (Signed)
mammo - refer to [facility] - return to clinic next month Colonoscopy - plans to try for [facility]

## 2015-04-26 NOTE — Assessment & Plan Note (Signed)
BP Readings from Last 3 Encounters:  04/26/15 131/58  08/03/14 137/50  07/19/14 159/63    Lab Results  Component Value Date   NA 138 07/13/2014   K 3.9 07/13/2014   CREATININE 0.64 07/13/2014    Assessment: Blood pressure control:  well controlled Progress toward BP goal:   at goal Comments: Reports compliance with medications.   Plan: Medications:  continue current medications:  Chlorthalidone 12.5mg  daily, losartan 100mg  daily Educational resources provided: brochure Other plans: Advised adding light exercise such as walking.  BMP next visit.

## 2015-04-26 NOTE — Progress Notes (Signed)
Subjective:    Patient ID: Sherri Gill, female    DOB: 1951-05-14, 64 y.o.   MRN: [id number]  HPI Comments: [name] is a 64 year old woman with PMH as below here for follow-up and med refill.  Please see problem based charting for assessment and plan.     Past Medical History  Diagnosis Date  . Depression   . Chronic anemia 08/29/2012    Need colonoscopy or report. Need iron panel.   . Dyslipidemia 06/30/2007  . Essential hypertension, benign 06/30/2007  . Type 2 diabetes mellitus, uncontrolled, with neuropathy 06/30/2007  . GERD 06/30/2007  . Insomnia 06/11/2012  . Peripheral neuropathy 12/28/2009  . Obesity, BMI 35-40 06/11/2012   Current Outpatient Prescriptions on File Prior to Visit  Medication Sig Dispense Refill  . aspirin (ANACIN) 81 MG EC tablet Take 81 mg by mouth daily.      [name] atorvastatin (LIPITOR) 80 MG tablet Take 1 tablet (80 mg total) by mouth daily. 30 tablet 1  . Biotin 5000 MCG CAPS Take 10,000 mcg by mouth every morning.    . Chlorphen-Phenyleph-ASA (ALKA-SELTZER PLUS COLD) 2-7.8-325 MG TBEF Take 1 tablet by mouth daily as needed (for cold).    . chlorthalidone (HYGROTON) 25 MG tablet Take 0.5 tablets (12.5 mg total) by mouth daily. 30 tablet 3  . citalopram (CELEXA) 20 MG tablet TAKE ONE TABLET BY MOUTH ONCE DAILY 90 tablet 0  . ibuprofen (ADVIL,MOTRIN) 800 MG tablet Take 1 tablet (800 mg total) by mouth every 8 (eight) hours as needed. 30 tablet 0  . losartan (COZAAR) 100 MG tablet Take 1 tablet (100 mg total) by mouth daily. 30 tablet 11  . metFORMIN (GLUCOPHAGE) 1000 MG tablet TAKE ONE TABLET BY MOUTH TWICE DAILY WITH A MEAL 60 tablet 3  . pravastatin (PRAVACHOL) 40 MG tablet TAKE ONE TABLET BY MOUTH ONCE DAILY 90 tablet 3  . psyllium (METAMUCIL) 58.6 % packet Take 1 packet by mouth daily as needed (for fiber supplement).     No current facility-administered medications on file prior to visit.    Review of Systems  Constitutional: Negative for fever, chills,  appetite change and unexpected weight change.  Respiratory: Negative for shortness of breath.   Cardiovascular: Negative for chest pain, palpitations and leg swelling.  Gastrointestinal: Negative for nausea, vomiting, abdominal pain, diarrhea, constipation and blood in stool.  Genitourinary: Negative for dysuria and hematuria.       Filed Vitals:   04/26/15 1608 04/26/15 1700  BP: 147/64 131/58  Pulse: 76 86  Temp: 97.9 F (36.6 C)   TempSrc: Oral   Height: 5\' 3"  (1.6 m)   Weight: 215 lb 4.8 oz (97.659 kg)   SpO2: 98%      Objective:   Physical Exam  Constitutional: She is oriented to person, place, and time. She appears well-developed. No distress.  HENT:  Head: Normocephalic and atraumatic.  Mouth/Throat: Oropharynx is clear and moist. No oropharyngeal exudate.  Eyes: EOM are normal. Pupils are equal, round, and reactive to light.  Neck: Neck supple.  Cardiovascular: Normal rate and regular rhythm.  Exam reveals no gallop and no friction rub.   No murmur heard. Pulmonary/Chest: Effort normal. No respiratory distress. She has no wheezes. She has no rales.  Abdominal: Soft. Bowel sounds are normal. She exhibits no distension. There is no tenderness. There is no rebound.  Musculoskeletal: Normal range of motion. She exhibits no edema or tenderness.  Neurological: She is alert and oriented to  person, place, and time. No cranial nerve deficit.  Skin: Skin is warm. She is not diaphoretic.  Psychiatric: She has a normal mood and affect. Her behavior is normal. Judgment and thought content normal.  Vitals reviewed.         Assessment & Plan:  Please see problem based charting for assessment and plan.

## 2015-04-28 NOTE — Progress Notes (Signed)
INTERNAL MEDICINE TEACHING ATTENDING ADDENDUM - Vali Capano, MD: I reviewed and discussed at the time of visit with the resident Dr. Wilson, the patient's medical history, physical examination, diagnosis and results of pertinent tests and treatment and I agree with the patient's care as documented.  

## 2015-09-19 ENCOUNTER — Encounter: Payer: Self-pay | Admitting: Student

## 2015-10-08 IMAGING — CT CT HEAD W/O CM
1 series · 16 of 30 positions shown, 20 images · non-contrast
Comparison: None.

CLINICAL DATA: Headache dizziness and neck pain with increasing
frequency.

EXAM:
CT HEAD WITHOUT CONTRAST
TECHNIQUE: Contiguous axial images were obtained from the base of the skull
through the vertex without intravenous contrast.

[Series 2: head 5.0 h30s · axial · 0.46mm/px · z∈[+1092,+1237]mm · 16 of 33 slices shown, 20 images]
[im 2/33  brain]
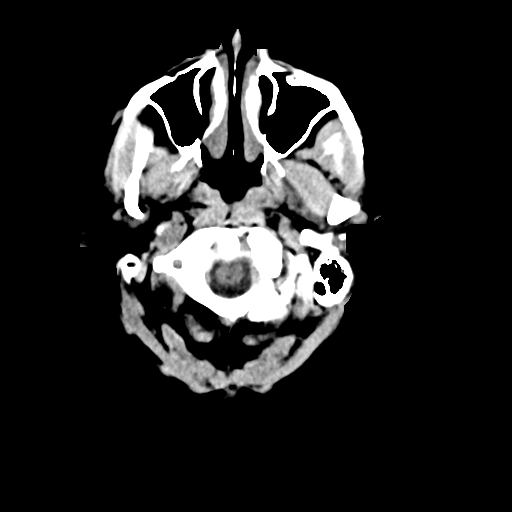
[im 2/33  bone]
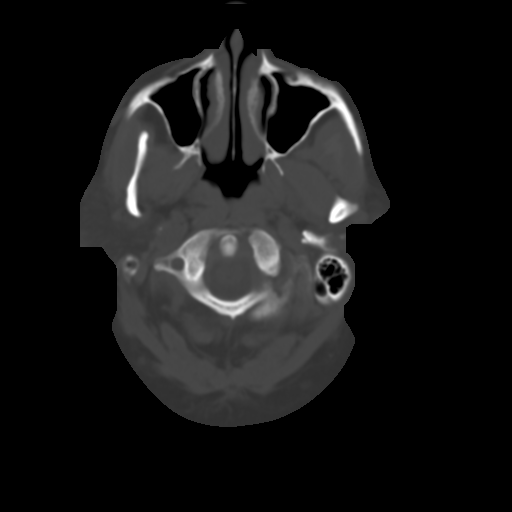
[im 4/33  brain]
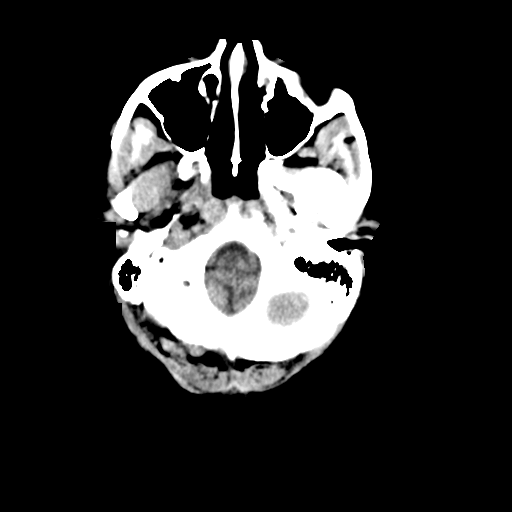
[im 6/33  brain]
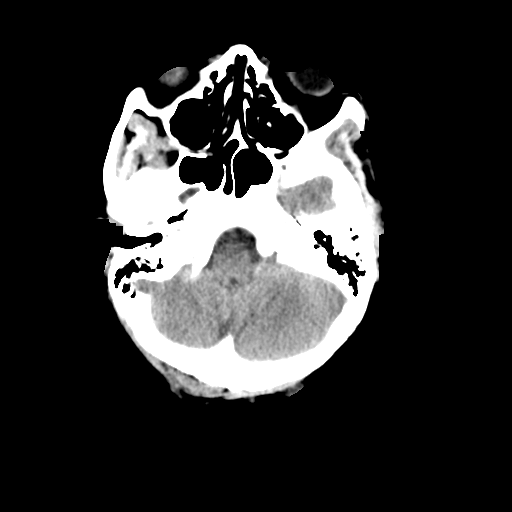
[im 8/33  brain]
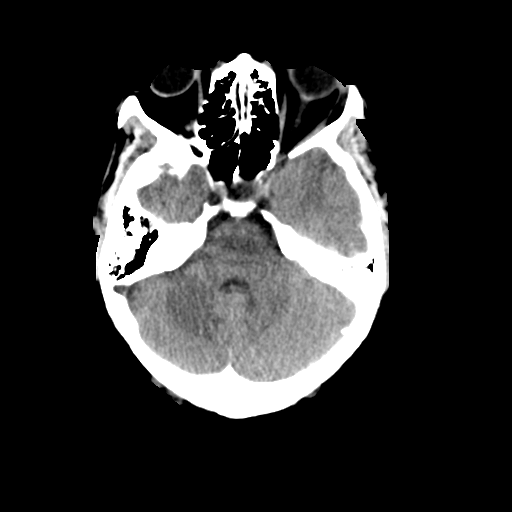
[im 9/33  brain]
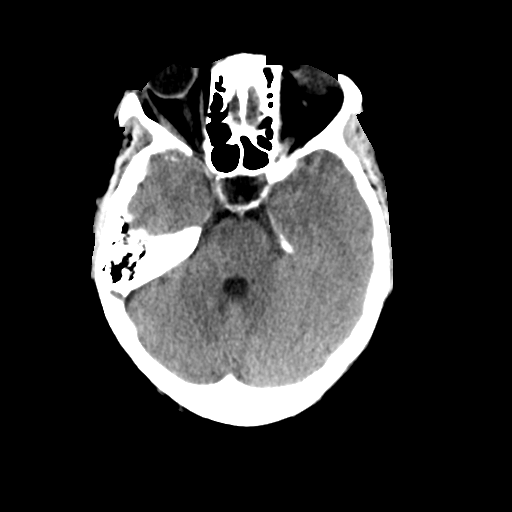
[im 9/33  bone]
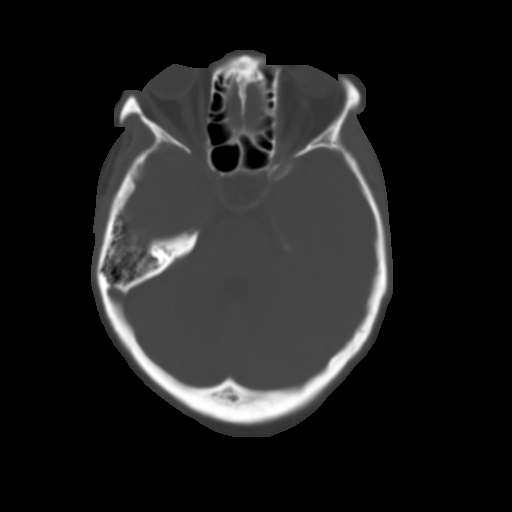
[im 12/33  brain]
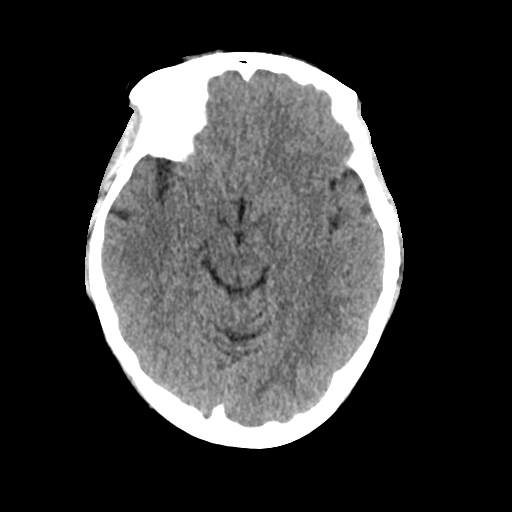
[im 14/33  brain]
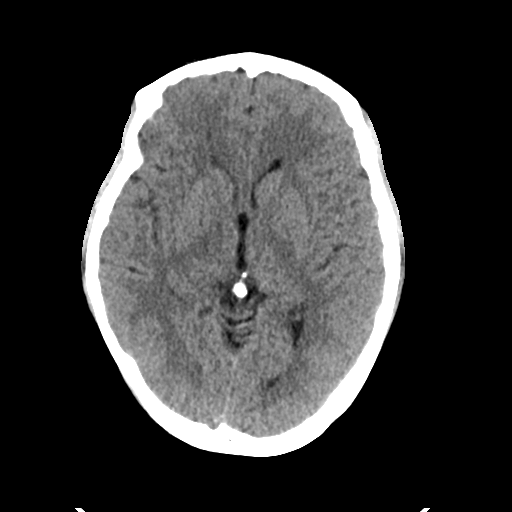
[im 16/33  brain]
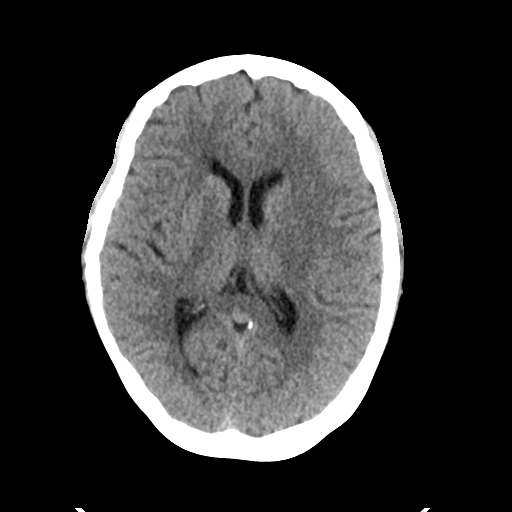
[im 17/33  brain]
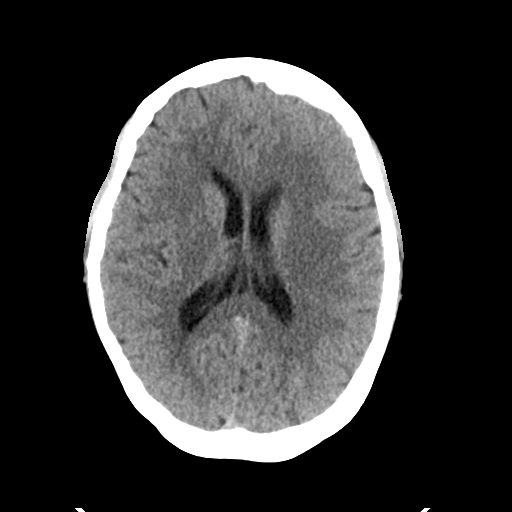
[im 17/33  bone]
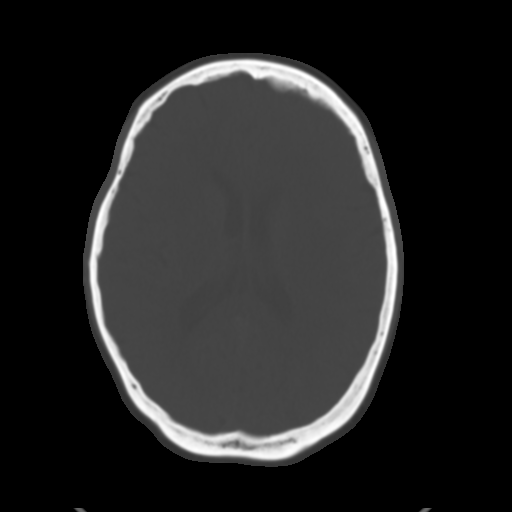
[im 19/33  brain]
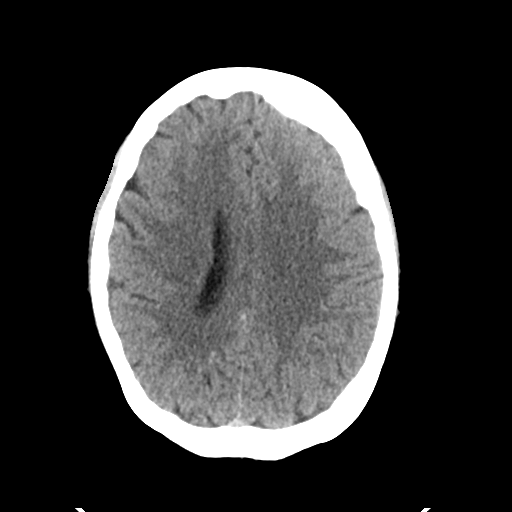
[im 21/33  brain]
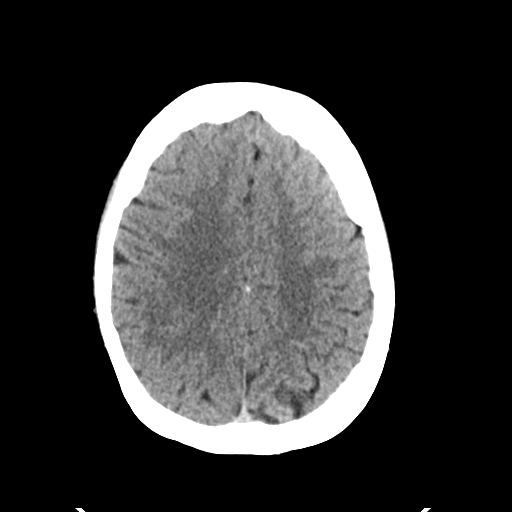
[im 24/33  brain]
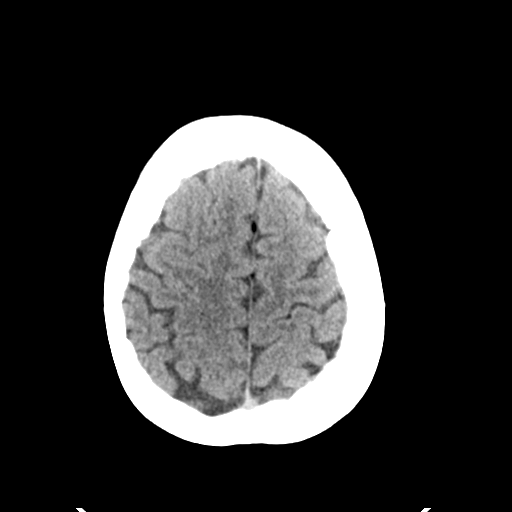
[im 25/33  brain]
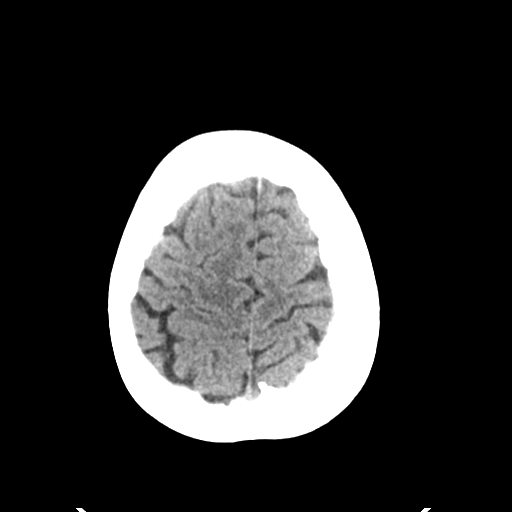
[im 25/33  bone]
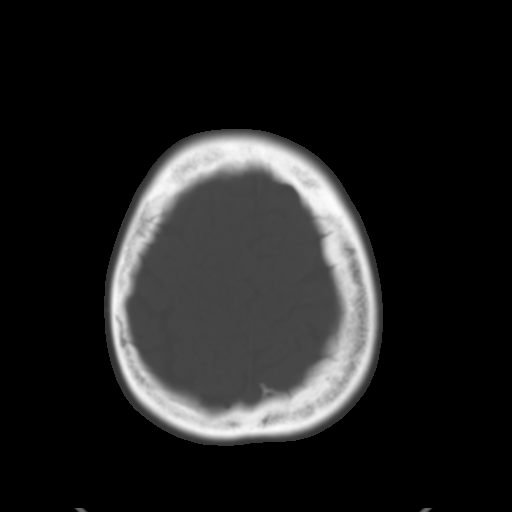
[im 27/33  brain]
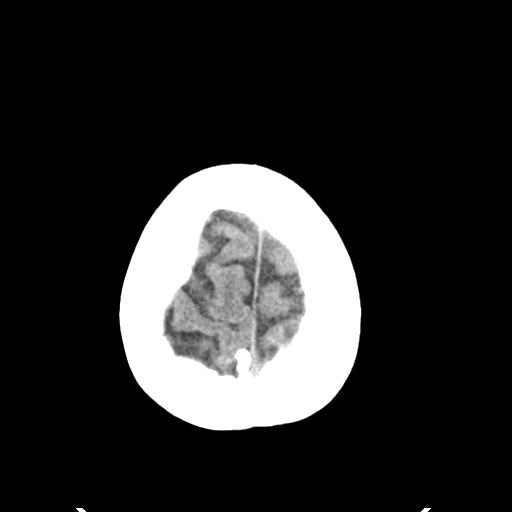
[im 29/33  brain]
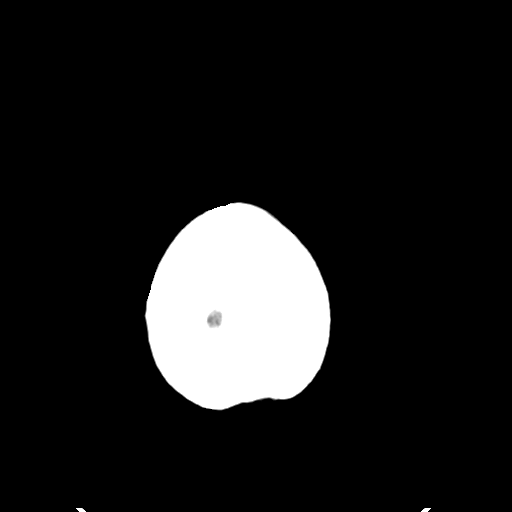
[im 31/33  brain]
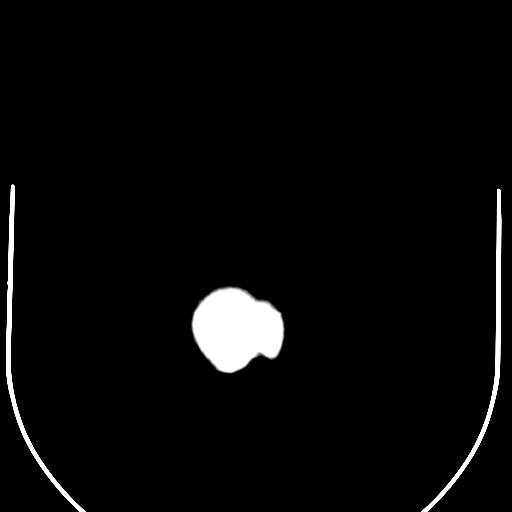

[16 of 30 positions shown; findings below may reference images not displayed]

FINDINGS: No acute cortical infarct, hemorrhage, or mass lesion is present.
The ventricles are of normal size. No significant extra-axial fluid
collection is evident. The paranasal sinuses and mastoid air cells
are clear. The osseous skull is intact.
IMPRESSION: Negative CT of the head.

## 2015-10-19 ENCOUNTER — Telehealth: Payer: Self-pay | Admitting: *Deleted

## 2015-10-19 NOTE — Telephone Encounter (Signed)
[facility]  needs a refill on gabapentin 300mg  #90 - last filled 02/23/14. Hilda Blades Liliana Dang RN 10/19/15 9:45AM

## 2015-10-19 NOTE — Telephone Encounter (Signed)
Pt aware of Dr Redmond Pulling message and has an appt sch 11/15/15 with Dr Redmond Pulling.

## 2015-10-19 NOTE — Telephone Encounter (Signed)
N/A at home phone number and recording states mailbox has not been set up.

## 2015-10-19 NOTE — Telephone Encounter (Signed)
This med was prescribed once by another provider in April 2015 but no recent fills.  I have not seen Sherri Gill in 6 months.  Please ask her to schedule follow-up with me and we can see if this med is appropriate at that time.

## 2015-10-19 NOTE — Telephone Encounter (Signed)
N/A on home/mobile number and mailbox is not set up.

## 2015-10-30 ENCOUNTER — Other Ambulatory Visit: Payer: Self-pay | Admitting: Internal Medicine

## 2015-11-01 ENCOUNTER — Other Ambulatory Visit: Payer: Self-pay | Admitting: *Deleted

## 2015-11-03 ENCOUNTER — Other Ambulatory Visit: Payer: Self-pay | Admitting: *Deleted

## 2015-11-03 MED ORDER — PRAVASTATIN SODIUM 40 MG PO TABS: 40.0000 mg | ORAL_TABLET | Freq: Every day | ORAL | Status: DC

## 2015-11-03 NOTE — Telephone Encounter (Signed)
Have called twice, vmail states that mailbox is not set up to call again, will call 1 more time

## 2015-11-03 NOTE — Telephone Encounter (Signed)
Talked with pt and will bring meds at appt time - had to resch due to work.

## 2015-11-15 ENCOUNTER — Encounter: Payer: Self-pay | Admitting: Internal Medicine

## 2015-12-13 ENCOUNTER — Other Ambulatory Visit: Payer: Self-pay | Admitting: Internal Medicine

## 2016-01-23 ENCOUNTER — Encounter: Payer: Self-pay | Admitting: Internal Medicine

## 2016-02-15 ENCOUNTER — Other Ambulatory Visit: Payer: Self-pay | Admitting: Internal Medicine

## 2016-02-19 ENCOUNTER — Telehealth: Payer: Self-pay

## 2016-02-19 ENCOUNTER — Other Ambulatory Visit: Payer: Self-pay

## 2016-02-19 NOTE — Telephone Encounter (Signed)
Please call pt back.

## 2016-02-19 NOTE — Telephone Encounter (Signed)
Pt needing citalopram and wanting to check status on losartan.  Advised losartan sent to pharmacy today- she will wait to hear from me on the citalopram.

## 2016-02-20 ENCOUNTER — Other Ambulatory Visit: Payer: Self-pay | Admitting: Internal Medicine

## 2016-02-20 MED ORDER — CITALOPRAM HYDROBROMIDE 20 MG PO TABS: 20.0000 mg | ORAL_TABLET | Freq: Every day | ORAL | Status: DC

## 2016-02-20 NOTE — Telephone Encounter (Signed)
Celexa filled for 1 month.  Patient needs to keep May appt with Dr. Shawnie Pons. Thank you.

## 2016-02-20 NOTE — Telephone Encounter (Signed)
Revision- patient not available, no VM

## 2016-02-20 NOTE — Telephone Encounter (Signed)
Pt aware.

## 2016-02-20 NOTE — Telephone Encounter (Signed)
Pt called and stated pharm does not have script for celexa, called pharm in [place], the med is ready for pick up, they will call her and let her know to pick it up

## 2016-03-13 ENCOUNTER — Telehealth: Payer: Self-pay | Admitting: Internal Medicine

## 2016-03-13 NOTE — Telephone Encounter (Signed)
APT. REMINDER CALL, ;MTCB

## 2016-03-14 ENCOUNTER — Other Ambulatory Visit: Payer: Self-pay | Admitting: Internal Medicine

## 2016-03-14 ENCOUNTER — Ambulatory Visit (INDEPENDENT_AMBULATORY_CARE_PROVIDER_SITE_OTHER): Payer: Self-pay | Admitting: Internal Medicine

## 2016-03-14 ENCOUNTER — Encounter: Payer: Self-pay | Admitting: Internal Medicine

## 2016-03-14 VITALS — BP 138/53 | HR 80 | Temp 98.5°F | Ht 63.0 in | Wt 214.9 lb

## 2016-03-14 DIAGNOSIS — E1165 Type 2 diabetes mellitus with hyperglycemia: Principal | ICD-10-CM

## 2016-03-14 DIAGNOSIS — R748 Abnormal levels of other serum enzymes: Secondary | ICD-10-CM

## 2016-03-14 DIAGNOSIS — Z7984 Long term (current) use of oral hypoglycemic drugs: Secondary | ICD-10-CM

## 2016-03-14 DIAGNOSIS — IMO0002 Reserved for concepts with insufficient information to code with codable children: Secondary | ICD-10-CM

## 2016-03-14 DIAGNOSIS — F329 Major depressive disorder, single episode, unspecified: Secondary | ICD-10-CM

## 2016-03-14 DIAGNOSIS — F32A Depression, unspecified: Secondary | ICD-10-CM

## 2016-03-14 DIAGNOSIS — I10 Essential (primary) hypertension: Secondary | ICD-10-CM

## 2016-03-14 DIAGNOSIS — D649 Anemia, unspecified: Secondary | ICD-10-CM

## 2016-03-14 DIAGNOSIS — E785 Hyperlipidemia, unspecified: Secondary | ICD-10-CM

## 2016-03-14 DIAGNOSIS — E1140 Type 2 diabetes mellitus with diabetic neuropathy, unspecified: Secondary | ICD-10-CM

## 2016-03-14 DIAGNOSIS — E784 Other hyperlipidemia: Secondary | ICD-10-CM

## 2016-03-14 DIAGNOSIS — F4321 Adjustment disorder with depressed mood: Secondary | ICD-10-CM

## 2016-03-14 DIAGNOSIS — Z79899 Other long term (current) drug therapy: Secondary | ICD-10-CM

## 2016-03-14 DIAGNOSIS — E1169 Type 2 diabetes mellitus with other specified complication: Secondary | ICD-10-CM

## 2016-03-14 LAB — POCT GLYCOSYLATED HEMOGLOBIN (HGB A1C): Hemoglobin A1C: 7.7

## 2016-03-14 LAB — GLUCOSE, CAPILLARY: Glucose-Capillary: 177 mg/dL — ABNORMAL HIGH (ref 65–99)

## 2016-03-14 MED ORDER — LOSARTAN POTASSIUM 100 MG PO TABS: 100.0000 mg | ORAL_TABLET | Freq: Every day | ORAL | Status: DC

## 2016-03-14 MED ORDER — CITALOPRAM HYDROBROMIDE 40 MG PO TABS: 40.0000 mg | ORAL_TABLET | Freq: Every day | ORAL | Status: DC

## 2016-03-14 MED ORDER — ATORVASTATIN CALCIUM 40 MG PO TABS: 40.0000 mg | ORAL_TABLET | Freq: Every day | ORAL | Status: DC

## 2016-03-14 MED ORDER — DIPHENHYDRAMINE HCL 25 MG PO TABS: 25.0000 mg | ORAL_TABLET | Freq: Every evening | ORAL | Status: DC | PRN

## 2016-03-14 MED ORDER — METFORMIN HCL 1000 MG PO TABS: 1000.0000 mg | ORAL_TABLET | Freq: Two times a day (BID) | ORAL | Status: DC

## 2016-03-14 MED ORDER — CHLORTHALIDONE 25 MG PO TABS: 12.5000 mg | ORAL_TABLET | Freq: Every day | ORAL | Status: DC

## 2016-03-14 NOTE — Progress Notes (Signed)
   Subjective:    Patient ID: Sherri Gill, female    DOB: Apr 03, 1951, 65 y.o.   MRN: [id number]  HPI Sherri Gill is a 65 year old female with a hypertension, non-insulin-dependent type 2 diabetes with fair control who presents today for medication refill. Please see assessment & plan for status of chronic medical problems.    Review of Systems  Constitutional: Positive for appetite change and fatigue. Negative for unexpected weight change.  Psychiatric/Behavioral: Positive for sleep disturbance and decreased concentration. Negative for suicidal ideas, hallucinations and self-injury. The patient is nervous/anxious and is hyperactive.        Objective:   Physical Exam  Constitutional: She is oriented to person, place, and time. She appears well-developed and well-nourished. No distress.  HENT:  Head: Normocephalic and atraumatic.  Eyes: Conjunctivae are normal. No scleral icterus.  Neck: No tracheal deviation present.  Cardiovascular: Normal rate and regular rhythm.  Exam reveals no gallop and no friction rub.   No murmur heard. Pulmonary/Chest: Effort normal and breath sounds normal. No stridor. No respiratory distress. She has no wheezes. She has no rales.  Neurological: She is alert and oriented to person, place, and time.  Skin: Skin is warm and dry. She is not diaphoretic.  Psychiatric:  Sad affect as evident by tearfulness          Assessment & Plan:

## 2016-03-14 NOTE — Assessment & Plan Note (Signed)
Overview Her blood pressure today is 138/53, stable from last visit. She acknowledges adherence to losartan 100 mg daily along with chlorthalidone 12.5 mg daily. She does not report any symptoms of hypertensive urgency, like headache, blurry vision, chest pain, difficulty breathing.  Assessment Blood pressure at goal less than 140/90 given comorbid diabetes  Plan Refill chlorthalidone 12.5 mg daily and losartan 100 mg daily

## 2016-03-14 NOTE — Progress Notes (Signed)
Medicine attending: Medical history, presenting problems, physical findings, and medications, reviewed with resident physician Dr Rushil Patel on the day of the patient visit and I concur with his evaluation and management plan. 
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
## 2016-03-14 NOTE — Patient Instructions (Signed)
For your mood, please take citalopram (Celexa) 40mg . Take 2 tablets of your old prescription before switching to 1 tablet of the new prescription.  For your sleep, try Benadryl 25mg  daily.  I have refilled your other medications. Please see Dr. Redmond Pulling in 2 weeks.

## 2016-03-14 NOTE — Addendum Note (Signed)
Addended by: Truddie Crumble on: 03/14/2016 10:57 AM   Modules accepted: Orders

## 2016-03-14 NOTE — Assessment & Plan Note (Addendum)
Overview Since February, she acknowledges she has not been on her routine level physical activity due to her mood symptoms. She is also been out of work and is to resume employment next week. She is asking for refills of her medications and has been out of pravastatin 40 mg for some time now.  Assessment Non-insulin-dependent type 2 diabetes on metformin monotherapy with fair glycemic control. A1c 7.4, not at goal of less than 7% given her age and comorbidities.  Plan -Recheck A1c today -Refill metformin 1000 mg twice daily -Check CMET and lipid panel before prescribing atorvastatin 40 mg daily for moderate intensity dosing  ADDENDUM 03/14/2016  2:03 PM:  A1c 7.7, mildly increased from 7.4 in June 2016.

## 2016-03-15 LAB — CBC
HEMATOCRIT: 33.1 % — AB (ref 34.0–46.6)
Hemoglobin: 10.7 g/dL — ABNORMAL LOW (ref 11.1–15.9)
MCH: 25.2 pg — ABNORMAL LOW (ref 26.6–33.0)
MCHC: 32.3 g/dL (ref 31.5–35.7)
MCV: 78 fL — AB (ref 79–97)
Platelets: 379 10*3/uL (ref 150–379)
RBC: 4.24 x10E6/uL (ref 3.77–5.28)
RDW: 16.3 % — ABNORMAL HIGH (ref 12.3–15.4)
WBC: 9.5 10*3/uL (ref 3.4–10.8)

## 2016-03-15 LAB — CMP14 + ANION GAP
ALBUMIN: 4.2 g/dL (ref 3.6–4.8)
ALT: 19 IU/L (ref 0–32)
AST: 19 IU/L (ref 0–40)
Albumin/Globulin Ratio: 1.4 (ref 1.2–2.2)
Alkaline Phosphatase: 163 IU/L — ABNORMAL HIGH (ref 39–117)
Anion Gap: 22 mmol/L — ABNORMAL HIGH (ref 10.0–18.0)
BILIRUBIN TOTAL: 0.2 mg/dL (ref 0.0–1.2)
BUN / CREAT RATIO: 15 (ref 12–28)
BUN: 12 mg/dL (ref 8–27)
CHLORIDE: 95 mmol/L — AB (ref 96–106)
CO2: 23 mmol/L (ref 18–29)
Calcium: 9.3 mg/dL (ref 8.7–10.3)
Creatinine, Ser: 0.78 mg/dL (ref 0.57–1.00)
GFR calc non Af Amer: 82 mL/min/{1.73_m2} (ref >59)
GFR, EST AFRICAN AMERICAN: 94 mL/min/{1.73_m2} (ref >59)
Globulin, Total: 3 g/dL (ref 1.5–4.5)
Glucose: 143 mg/dL — ABNORMAL HIGH (ref 65–99)
Potassium: 3.5 mmol/L (ref 3.5–5.2)
Sodium: 140 mmol/L (ref 134–144)
TOTAL PROTEIN: 7.2 g/dL (ref 6.0–8.5)

## 2016-03-15 LAB — LIPID PANEL
Chol/HDL Ratio: 3.5 ratio units (ref 0.0–4.4)
Cholesterol, Total: 212 mg/dL — ABNORMAL HIGH (ref 100–199)
HDL: 61 mg/dL (ref >39)
LDL CALC: 125 mg/dL — AB (ref 0–99)
Triglycerides: 128 mg/dL (ref 0–149)
VLDL Cholesterol Cal: 26 mg/dL (ref 5–40)

## 2016-03-17 DIAGNOSIS — R748 Abnormal levels of other serum enzymes: Secondary | ICD-10-CM | POA: Insufficient documentation

## 2016-03-17 NOTE — Assessment & Plan Note (Signed)
ADDENDUM 03/17/2016  12:28 AM:  CBC notable for microcytic anemia with hemoglobin 10.7, MCV 78. No iron studies on file though should be checked at follow-up.

## 2016-03-17 NOTE — Assessment & Plan Note (Signed)
ADDENDUM 03/17/2016  12:27 AM:  CMET notable for elevated alkaline phosphatase 163 without other LFT abnormalities. Alkaline phosphatase has been elevated since 2010.

## 2016-03-17 NOTE — Assessment & Plan Note (Signed)
ADDENDUM 03/17/2016  12:29 AM:  Per 2013 AHA/ACC ASCVD risk calculator, 10-year risk is 21 % and 3.7 % with optimal risk factors. High intensity statin therapy is thus indicated and should be revisited at follow-up.

## 2016-03-17 NOTE — Addendum Note (Signed)
Addended by: Charlott Rakes V on: 03/17/2016 12:30 AM   Modules accepted: SmartSet

## 2016-04-04 ENCOUNTER — Ambulatory Visit: Payer: Self-pay | Admitting: Internal Medicine

## 2016-04-10 ENCOUNTER — Encounter: Payer: Self-pay | Admitting: *Deleted

## 2016-04-16 ENCOUNTER — Other Ambulatory Visit: Payer: Self-pay | Admitting: Internal Medicine

## 2016-04-17 NOTE — Telephone Encounter (Signed)
I have refilled for 30-day supply. Please schedule patient for follow-up appointment; if I'm not available for an appointment within the next 30 days, then schedule with another provider. thanks!

## 2016-05-23 ENCOUNTER — Other Ambulatory Visit: Payer: Self-pay | Admitting: Internal Medicine

## 2016-05-23 NOTE — Telephone Encounter (Signed)
I will prescribe a 30-day refill. Please call the patient and explain to them it's important I see them as a part of providing their care.

## 2016-07-10 ENCOUNTER — Other Ambulatory Visit: Payer: Self-pay | Admitting: Internal Medicine

## 2016-07-10 DIAGNOSIS — F329 Major depressive disorder, single episode, unspecified: Secondary | ICD-10-CM

## 2016-07-17 ENCOUNTER — Telehealth: Payer: Self-pay | Admitting: Internal Medicine

## 2016-07-17 NOTE — Telephone Encounter (Signed)
Agree with appt. If worsens prior to appt, will need to go toED

## 2016-07-17 NOTE — Telephone Encounter (Signed)
Return pt's call - states severe abd pain started around 2 AM this morning with low back pain, chills which started today, frequent loose stools but not diarrhea. Denies vomiting,h/a's, stiff neck. Appt given for tomorrow @ 1445 PM ; inform if symptoms/condition worsen to go to [facility] or ED. Pt voiced understanding.

## 2016-07-17 NOTE — Telephone Encounter (Signed)
Wants to talk to nurse °

## 2016-07-18 ENCOUNTER — Ambulatory Visit (INDEPENDENT_AMBULATORY_CARE_PROVIDER_SITE_OTHER): Payer: BLUE CROSS/BLUE SHIELD

## 2016-07-18 ENCOUNTER — Ambulatory Visit (HOSPITAL_COMMUNITY)
Admission: EM | Admit: 2016-07-18 | Discharge: 2016-07-18 | Disposition: A | Discharge status: Home / Self Care | Payer: BLUE CROSS/BLUE SHIELD | Attending: Internal Medicine | Admitting: Internal Medicine

## 2016-07-18 ENCOUNTER — Ambulatory Visit: Payer: Self-pay

## 2016-07-18 ENCOUNTER — Encounter (HOSPITAL_COMMUNITY): Payer: Self-pay | Admitting: Family Medicine

## 2016-07-18 DIAGNOSIS — B349 Viral infection, unspecified: Secondary | ICD-10-CM

## 2016-07-18 DIAGNOSIS — R1013 Epigastric pain: Secondary | ICD-10-CM

## 2016-07-18 LAB — POCT URINALYSIS DIP (DEVICE)
Bilirubin Urine: NEGATIVE
GLUCOSE, UA: NEGATIVE mg/dL
Hgb urine dipstick: NEGATIVE
KETONES UR: NEGATIVE mg/dL
LEUKOCYTES UA: NEGATIVE
Nitrite: NEGATIVE
Protein, ur: NEGATIVE mg/dL
SPECIFIC GRAVITY, URINE: 1.015 (ref 1.005–1.030)
Urobilinogen, UA: 1 mg/dL (ref 0.0–1.0)
pH: 7.5 (ref 5.0–8.0)

## 2016-07-18 LAB — GLUCOSE, CAPILLARY: Glucose-Capillary: 80 mg/dL (ref 65–99)

## 2016-07-18 MED ORDER — OMEPRAZOLE 20 MG PO CPDR: 20.0000 mg | DELAYED_RELEASE_CAPSULE | Freq: Every day | ORAL | 0 refills | Status: DC

## 2016-07-18 NOTE — ED Provider Notes (Signed)
CSN: [id number]     Arrival date & time 07/18/16  1624 History   None    Chief Complaint  Patient presents with  . Abdominal Pain  . Back Pain   (Consider location/radiation/quality/duration/timing/severity/associated sxs/prior Treatment) HPI Patient is a 65 year old female who states that approximately 2 days ago she had the onset of abdominal epigastric pain. She states she felt like she needed to vomit but was unable to do so she left work 30 minutes earlier to go home because she was not feeling well. She states that the following day she has similar symptoms. Was unable to see her primary care provider and is here for evaluation. She states that she has had no diarrhea or vomiting just a bit of nausea. She has had no urinary symptoms. But she does have some back pain by her report. She also states that she has had a low-grade temperature at home Past Medical History:  Diagnosis Date  . Chronic anemia 08/29/2012   Need colonoscopy or report. Need iron panel.   . Depression   . Dyslipidemia 06/30/2007  . Essential hypertension, benign 06/30/2007  . GERD 06/30/2007  . Insomnia 06/11/2012  . Obesity, BMI 35-40 06/11/2012  . Peripheral neuropathy 12/28/2009  . Type 2 diabetes mellitus, uncontrolled, with neuropathy ([place]) 06/30/2007   Past Surgical History:  Procedure Laterality Date  . COLONOSCOPY W/ POLYPECTOMY  9/05  . TUBAL LIGATION     Family History  Problem Relation Age of Onset  . Dementia Mother    Social History  Substance Use Topics  . Smoking status: Former Smoker    Quit date: 10/28/1978  . Smokeless tobacco: Never Used  . Alcohol use No   OB History    No data available     Review of Systems  Denies: HEADACHE, NAUSEA,  CHEST PAIN, CONGESTION, DYSURIA, SHORTNESS OF BREATH     Allergies  Codeine sulfate; Penicillins; Percocet [oxycodone-acetaminophen]; and Lisinopril  Home Medications   Prior to Admission medications   Medication Sig Start Date End Date Taking?  Authorizing Provider  aspirin (ANACIN) 81 MG EC tablet Take 81 mg by mouth daily.      Historical Provider, MD  atorvastatin (LIPITOR) 40 MG tablet Take 1 tablet (40 mg total) by mouth daily. 03/14/16   [name], MD  chlorthalidone (HYGROTON) 25 MG tablet Take 0.5 tablets (12.5 mg total) by mouth daily. 03/14/16   [name], MD  citalopram (CELEXA) 40 MG tablet Take 1 tablet (40 mg total) by mouth daily. 07/11/16   Oval Linsey, MD  diphenhydrAMINE (BENADRYL) 25 MG tablet Take 1 tablet (25 mg total) by mouth at bedtime as needed for sleep. 03/14/16   [name], MD  losartan (COZAAR) 100 MG tablet Take 1 tablet (100 mg total) by mouth daily. 03/14/16   [name], MD  metFORMIN (GLUCOPHAGE) 1000 MG tablet Take 1 tablet (1,000 mg total) by mouth 2 (two) times daily with a meal. 03/14/16   [name], MD   Meds Ordered and Administered this Visit  Medications - No data to display  BP 147/70 (BP Location: Left Arm)   Pulse 76   Temp 98.4 F (36.9 C) (Oral)   Resp 16   SpO2 96%  No data found.   Physical Exam NURSES NOTES AND VITAL SIGNS REVIEWED. CONSTITUTIONAL: Well developed, well nourished, no acute distress HEENT: normocephalic, atraumatic EYES: Conjunctiva normal NECK:normal ROM, supple, no adenopathy PULMONARY:No respiratory distress, normal effort ABDOMINAL: Soft, ND, NT  BS+, No CVAT MUSCULOSKELETAL: Normal ROM of all extremities,  SKIN: warm and dry without rash PSYCHIATRIC: Mood and affect, behavior are normal  Urgent Care Course   Clinical Course    Procedures (including critical care time)  Labs Review Labs Reviewed  GLUCOSE, CAPILLARY  POCT URINALYSIS DIP (DEVICE)    Imaging Review Dg Abd 1 View  Result Date: 07/18/2016 CLINICAL DATA:  Stomach pain.  Fever and fatigue. EXAM: ABDOMEN - 1 VIEW COMPARISON:  None. FINDINGS: The bowel gas pattern is normal. No radio-opaque calculi or other significant radiographic abnormality are seen. Bilateral  tubal ligation clips are seen. IMPRESSION: No radiographic evidence of obstruction. Electronically Signed   By: Ulyses Jarred M.D.   On: 07/18/2016 18:12    Discussed with patient prior to discharge Visual Acuity Review  Right Eye Distance:   Left Eye Distance:   Bilateral Distance:    Right Eye Near:   Left Eye Near:    Bilateral Near:        Prescription for omeprazole as provided to the patient.  MDM   1. Epigastric pain   2. Nonspecific syndrome suggestive of viral illness     Patient is reassured that there are no issues that require transfer to higher level of care at this time or additional tests. Patient is advised to continue home symptomatic treatment. Patient is advised that if there are new or worsening symptoms to attend the emergency department, contact primary care provider, or return to UC. Instructions of care provided discharged home in stable condition.    THIS NOTE WAS GENERATED USING A VOICE RECOGNITION SOFTWARE PROGRAM. ALL REASONABLE EFFORTS  WERE MADE TO PROOFREAD THIS DOCUMENT FOR ACCURACY.  I have verbally reviewed the discharge instructions with the patient. A printed AVS was given to the patient.  All questions were answered prior to discharge.    Please note patient return to urgent care after being discharged stated that she was uncomfortable with the diagnosis. Patient is referred to the emergency department for further evaluation and treatment.    Konrad Felix, PA 07/18/16 1900

## 2016-07-18 NOTE — ED Triage Notes (Signed)
Pt here for abd pain, lower back pain, body aches and diarrhea x 2 days.

## 2016-07-19 ENCOUNTER — Encounter: Payer: Self-pay | Admitting: Internal Medicine

## 2016-07-19 ENCOUNTER — Ambulatory Visit: Payer: BLUE CROSS/BLUE SHIELD

## 2016-09-23 ENCOUNTER — Telehealth: Payer: Self-pay | Admitting: Internal Medicine

## 2016-09-23 NOTE — Telephone Encounter (Signed)
I have not seen this patient since May after which we increased her antidepressant dose. Can we check on her to see how she is doing? If she is not tolerating this dose, she needs to be seen in clinic.  Thank you.

## 2016-09-23 NOTE — Telephone Encounter (Signed)
She will be in wed acc at 1315

## 2016-09-24 ENCOUNTER — Telehealth: Payer: Self-pay | Admitting: Internal Medicine

## 2016-09-24 NOTE — Telephone Encounter (Signed)
APT. REMINDER CALL, NO ANSWER, NO VOICEMAIL °

## 2016-09-25 ENCOUNTER — Ambulatory Visit (HOSPITAL_COMMUNITY)
Admission: RE | Admit: 2016-09-25 | Discharge: 2016-09-25 | Disposition: A | Discharge status: Home / Self Care | Payer: BLUE CROSS/BLUE SHIELD | Source: Ambulatory Visit | Attending: Internal Medicine | Admitting: Internal Medicine

## 2016-09-25 ENCOUNTER — Ambulatory Visit (INDEPENDENT_AMBULATORY_CARE_PROVIDER_SITE_OTHER): Payer: BLUE CROSS/BLUE SHIELD | Admitting: Internal Medicine

## 2016-09-25 VITALS — BP 142/62 | HR 80 | Temp 98.1°F | Ht 63.0 in | Wt 213.1 lb

## 2016-09-25 DIAGNOSIS — Z79899 Other long term (current) drug therapy: Secondary | ICD-10-CM

## 2016-09-25 DIAGNOSIS — D509 Iron deficiency anemia, unspecified: Secondary | ICD-10-CM | POA: Diagnosis not present

## 2016-09-25 DIAGNOSIS — I440 Atrioventricular block, first degree: Secondary | ICD-10-CM | POA: Diagnosis not present

## 2016-09-25 DIAGNOSIS — R9431 Abnormal electrocardiogram [ECG] [EKG]: Secondary | ICD-10-CM | POA: Diagnosis not present

## 2016-09-25 DIAGNOSIS — Z87891 Personal history of nicotine dependence: Secondary | ICD-10-CM

## 2016-09-25 DIAGNOSIS — Z7984 Long term (current) use of oral hypoglycemic drugs: Secondary | ICD-10-CM

## 2016-09-25 DIAGNOSIS — F329 Major depressive disorder, single episode, unspecified: Secondary | ICD-10-CM | POA: Insufficient documentation

## 2016-09-25 DIAGNOSIS — E1140 Type 2 diabetes mellitus with diabetic neuropathy, unspecified: Secondary | ICD-10-CM

## 2016-09-25 DIAGNOSIS — E1169 Type 2 diabetes mellitus with other specified complication: Secondary | ICD-10-CM

## 2016-09-25 DIAGNOSIS — E785 Hyperlipidemia, unspecified: Principal | ICD-10-CM

## 2016-09-25 DIAGNOSIS — Z23 Encounter for immunization: Secondary | ICD-10-CM

## 2016-09-25 DIAGNOSIS — I10 Essential (primary) hypertension: Secondary | ICD-10-CM

## 2016-09-25 DIAGNOSIS — E1165 Type 2 diabetes mellitus with hyperglycemia: Secondary | ICD-10-CM

## 2016-09-25 DIAGNOSIS — R748 Abnormal levels of other serum enzymes: Secondary | ICD-10-CM | POA: Diagnosis not present

## 2016-09-25 DIAGNOSIS — IMO0002 Reserved for concepts with insufficient information to code with codable children: Secondary | ICD-10-CM

## 2016-09-25 LAB — POCT GLYCOSYLATED HEMOGLOBIN (HGB A1C): HEMOGLOBIN A1C: 7.5

## 2016-09-25 LAB — GLUCOSE, CAPILLARY: GLUCOSE-CAPILLARY: 103 mg/dL — AB (ref 65–99)

## 2016-09-25 MED ORDER — LOSARTAN POTASSIUM-HCTZ 100-25 MG PO TABS: 1.0000 | ORAL_TABLET | Freq: Every day | ORAL | 1 refills | Status: DC

## 2016-09-25 MED ORDER — ATORVASTATIN CALCIUM 40 MG PO TABS: 40.0000 mg | ORAL_TABLET | Freq: Every day | ORAL | 3 refills | Status: DC

## 2016-09-25 MED ORDER — METFORMIN HCL 1000 MG PO TABS: 1000.0000 mg | ORAL_TABLET | Freq: Two times a day (BID) | ORAL | 1 refills | Status: DC

## 2016-09-25 MED ORDER — TRAZODONE HCL 50 MG PO TABS: 50.0000 mg | ORAL_TABLET | Freq: Three times a day (TID) | ORAL | 0 refills | Status: DC

## 2016-09-25 MED ORDER — ASPIRIN 81 MG PO TBEC: 81.0000 mg | DELAYED_RELEASE_TABLET | Freq: Every day | ORAL | 3 refills | Status: DC

## 2016-09-25 NOTE — Patient Instructions (Addendum)
For the Celexa, take 1/2 tablet x 7 days.  Then, start trazodone 1 tablet every 8 hours.   Do NOT mix Celexa and trazodone.  Let's see each other back in 3 months.

## 2016-09-25 NOTE — Progress Notes (Signed)
   CC: feeling tired  HPI:  [name] is a 65 y.o. female who presents today for feeling tired. Please see assessment & plan for status of chronic medical problems.   Past Medical History:  Diagnosis Date  . Chronic anemia 08/29/2012   Need colonoscopy or report. Need iron panel.   . Depression   . Dyslipidemia 06/30/2007  . Essential hypertension, benign 06/30/2007  . GERD 06/30/2007  . Insomnia 06/11/2012  . Obesity, BMI 35-40 06/11/2012  . Peripheral neuropathy 12/28/2009  . Type 2 diabetes mellitus, uncontrolled, with neuropathy ([place]) 06/30/2007    Review of Systems:  Please see each problem below for a pertinent review of systems.   Physical Exam:  Vitals:   09/25/16 1544  BP: (!) 142/62  Pulse: 80  Temp: 98.1 F (36.7 C)  TempSrc: Oral  SpO2: 100%  Weight: 213 lb 1.6 oz (96.7 kg)  Height: 5\' 3"  (1.6 m)   Physical Exam  Constitutional: She is oriented to person, place, and time. No distress.  HENT:  Head: Normocephalic and atraumatic.  Eyes: Conjunctivae are normal. No scleral icterus.  Cardiovascular: Normal rate and regular rhythm.   Pulmonary/Chest: Effort normal. No respiratory distress.  Neurological: She is alert and oriented to person, place, and time.  Skin: Skin is warm and dry. She is not diaphoretic.     Assessment & Plan:   See Encounters Tab for problem based charting.  Patient discussed with Dr. Dareen Piano

## 2016-09-26 LAB — CBC
HEMATOCRIT: 31.6 % — AB (ref 34.0–46.6)
Hemoglobin: 10 g/dL — ABNORMAL LOW (ref 11.1–15.9)
MCH: 24.3 pg — AB (ref 26.6–33.0)
MCHC: 31.6 g/dL (ref 31.5–35.7)
MCV: 77 fL — AB (ref 79–97)
Platelets: 430 10*3/uL — ABNORMAL HIGH (ref 150–379)
RBC: 4.11 x10E6/uL (ref 3.77–5.28)
RDW: 15.5 % — AB (ref 12.3–15.4)
WBC: 10.5 10*3/uL (ref 3.4–10.8)

## 2016-09-26 LAB — FERRITIN: Ferritin: 39 ng/mL (ref 15–150)

## 2016-09-26 LAB — GAMMA GT: GGT: 24 IU/L (ref 0–60)

## 2016-09-26 MED ORDER — FERROUS SULFATE 325 (65 FE) MG PO TABS: 325.0000 mg | ORAL_TABLET | Freq: Every day | ORAL | 3 refills | Status: DC

## 2016-09-26 NOTE — Assessment & Plan Note (Signed)
Assessment As noted back in May, she had a mild but persistent elevation in her alkaline phosphatase. She is agreeable to checking GGT.  Plan -Check GGT to confirm hepatobiliary source  ADDENDUM 09/26/2016  10:05 AM:  GGT normal, so we will check PTH, Vitamin D as the next step in workup of bone disorders.   Interestingly enough, she had elevated PTH back in 2011. Her most recent CMET in May was without hypercalcemia when corrected for albumin.

## 2016-09-26 NOTE — Assessment & Plan Note (Addendum)
Assessment Her blood pressure is mildly elevated at 142/62. She is taking losartan 100 mg and chlorthalidone 12.5 mg daily. She is agreeable to simplifying her regimen today.  Plan -Prescribed losartan/HCTZ 100/25 mg daily with plan to recheck BP at f/u

## 2016-09-26 NOTE — Assessment & Plan Note (Addendum)
Assessment She has been tolerating metformin 1000 mg twice daily. She is due for A1c recheck today.   Plan -Recheck A1c -Administer flu shot today  ADDENDUM 09/26/2016  10:04 AM:  A1c 7.5, slightly improved from 7.7 back in May. She is agreeable to meeting with Ms. Butch Penny in the interval before her next visit with me

## 2016-09-26 NOTE — Assessment & Plan Note (Signed)
Assessment PHQ-9 is 14 today. She has been taking citalopram 40mg  daily and feels it has improved her tearfulness and depressed mood. She is having difficulty sleeping and has been following the recommendations of her hospice grief counselor for better sleep hygeine, like getting out of her bed and reading for 30 minutes. She denies any suicidal ideation.   Overall, she looks improved as compared to six month ago immediately following her mother's death. QTc prolongation and insomnia are side effects of citalopram, and we will check an EKG before deciding to change therapy.  Plan -Check EKG  ADDENDUM 09/26/2016  9:56 AM:  EKG with QTc 452, just short of the 460 cutoff for females. We will taper her citalopram 20mg  off for the next 7 days with plan to start trazodone 50mg  every 8 hours beginning at bedtime. She was advised to not take these medications together and to follow-up if she feels any worse in her mood or side effects. We will schedule follow-up in 1 month.

## 2016-09-26 NOTE — Assessment & Plan Note (Addendum)
Assessment Since her last office visit, she has been feeling more tired. She attributes some of this to her depression though I think she is doing better from a mood symptoms standpoint aside of the insomnia.    Of interest, she has an unexplained chronic microcytic anemia which I noted back in May. She is post-menopausal and has not noticed any changes in appetite or bowel movements. She has never had a colonoscopy though thinks she has some relatives with sickle cell trait.   Plan -Check CBC and ferritin today and recommended we follow-up with GI for colonoscopy if she is iron deficient  ADDENDUM 09/26/2016  10:03 AM:  CBC with microcytic anemia: Hb 10, MCV 77. I think this is consistent with iron deficiency anemia with a ferritin 39 and elevated platelets 430. We will refer her to GI for colonoscopy and start iron tablets. We will also discontinue aspirin temporarily as she does not have a prior history of cardiac disease.   ADDENDUM 09/27/2016  3:05 PM:  I added on a retic count. This is not an adequate response to her anemia. I will add on peripheral smear at f/u.

## 2016-09-27 ENCOUNTER — Telehealth: Payer: Self-pay | Admitting: Internal Medicine

## 2016-09-27 DIAGNOSIS — D509 Iron deficiency anemia, unspecified: Secondary | ICD-10-CM

## 2016-09-27 LAB — RETICULOCYTES: RETIC CT PCT: 1.6 % (ref 0.6–2.6)

## 2016-09-27 LAB — SPECIMEN STATUS REPORT

## 2016-09-27 NOTE — Telephone Encounter (Signed)
I called and reviewed the recent results of her lab work. I stressed that she needs to follow-up with GI once the appointment has been made to rule out any source of bleeding from her colon.  She is to follow-up in one month to reassess diabetes and depression. I also told her she will need to have lab work done which I have pended for her other lab abnormalities.  She thanked me for my time. She was very concerned about these abnormalities, and I reassured her that we are still within the window of opportunity but to not let these things fall through the cracks.

## 2016-09-29 NOTE — Progress Notes (Signed)
Internal Medicine Clinic Attending  Case discussed with Dr. Patel,Rushil at the time of the visit.  We reviewed the resident's history and exam and pertinent patient test results.  I agree with the assessment, diagnosis, and plan of care documented in the resident's note.  

## 2016-11-07 ENCOUNTER — Ambulatory Visit (INDEPENDENT_AMBULATORY_CARE_PROVIDER_SITE_OTHER): Payer: BLUE CROSS/BLUE SHIELD | Admitting: Internal Medicine

## 2016-11-07 ENCOUNTER — Ambulatory Visit: Payer: BLUE CROSS/BLUE SHIELD

## 2016-11-07 ENCOUNTER — Ambulatory Visit: Payer: BLUE CROSS/BLUE SHIELD | Admitting: Dietician

## 2016-11-07 VITALS — BP 135/50 | HR 82 | Temp 98.0°F

## 2016-11-07 DIAGNOSIS — R748 Abnormal levels of other serum enzymes: Secondary | ICD-10-CM

## 2016-11-07 DIAGNOSIS — F329 Major depressive disorder, single episode, unspecified: Secondary | ICD-10-CM

## 2016-11-07 DIAGNOSIS — D509 Iron deficiency anemia, unspecified: Secondary | ICD-10-CM | POA: Diagnosis not present

## 2016-11-07 DIAGNOSIS — J309 Allergic rhinitis, unspecified: Secondary | ICD-10-CM

## 2016-11-07 DIAGNOSIS — G4700 Insomnia, unspecified: Secondary | ICD-10-CM

## 2016-11-07 DIAGNOSIS — I10 Essential (primary) hypertension: Secondary | ICD-10-CM

## 2016-11-07 DIAGNOSIS — Z87891 Personal history of nicotine dependence: Secondary | ICD-10-CM

## 2016-11-07 DIAGNOSIS — Z79899 Other long term (current) drug therapy: Secondary | ICD-10-CM

## 2016-11-07 MED ORDER — TRAZODONE HCL ER 150 MG PO TB24: 150.0000 mg | ORAL_TABLET | Freq: Every day | ORAL | 2 refills | Status: DC

## 2016-11-07 MED ORDER — FLUTICASONE PROPIONATE 50 MCG/ACT NA SUSP: 2.0000 | Freq: Every day | NASAL | Status: DC

## 2016-11-07 NOTE — Patient Instructions (Signed)
Sherri Gill,  I am checking your blood counts today as well as your vitamin D level. I will call you in the next couple of days with the results.  I have change the trazodone prescription the long acting formulation. Please start taking that at night.  I would like to see you back in in March for follow up.

## 2016-11-07 NOTE — Progress Notes (Signed)
   CC: BP recheck  HPI:  Ms.Rolanda D Testerman is a 66 y.o. female with a past medical history listed below here today for follow up of her BP, anemia and depression.  HTN - BP mildly elevated at last visit. Her medication regimen was consolidated from losartan 100 mg and chlorthalidone 12.5 mg to losartan/HCTZ 100/25 mg daily. BP improved today at 135/50.  Anemia - CBC at 11/29 visit shwoed microcytic anemia with a low normal ferritin of 39. She was having complaints of fatigue and noted to have nail changes at that time as well. She was started on iron supplements. She reports feeling much better today. Has energy and has improved her depression as well. No clear source of her anemia at this time. She was referred to GI for colonoscopy (overdue for one) to investigate further. Reports the letter went to her neighbor and she just brought it to her.  Depression - She was transitioned off Celexa on to Trazodone at last visit. She reports it makes her very somnolent and has only been taking 50 mg qhs due to this. It has cured her insomnia. Reports depression symptoms are much improved today and is related to her energy level from the anemia. PHQ9 today is 3.   Elevated Alk Phos - Mildly elevated alk phos. Checking vitamin D today.   Past Medical History:  Diagnosis Date  . Chronic anemia 08/29/2012   Need colonoscopy or report. Need iron panel.   . Depression   . Dyslipidemia 06/30/2007  . Essential hypertension, benign 06/30/2007  . GERD 06/30/2007  . Insomnia 06/11/2012  . Obesity, BMI 35-40 06/11/2012  . Peripheral neuropathy 12/28/2009  . Type 2 diabetes mellitus, uncontrolled, with neuropathy ([place]) 06/30/2007    Review of Systems:   Negative except as noted in HPI  Physical Exam:  Vitals:   11/07/16 1544  BP: (!) 135/50  Pulse: 82  Temp: 98 F (36.7 C)  TempSrc: Oral  SpO2: 99%   Constitutional: She is oriented to person, place, and time. No distress.  HENT:  Head: Normocephalic and  atraumatic.  Eyes: Conjunctivae are normal. No scleral icterus.  Cardiovascular: Normal rate and regular rhythm.   Pulmonary/Chest: Effort normal. No respiratory distress.  Neurological: She is alert and oriented to person, place, and time.  Skin: Skin is warm and dry. She is not diaphoretic.    Assessment & Plan:   See Encounters Tab for problem based charting.  Patient discussed with Dr. Dareen Piano

## 2016-11-08 DIAGNOSIS — J309 Allergic rhinitis, unspecified: Secondary | ICD-10-CM | POA: Insufficient documentation

## 2016-11-08 LAB — CBC
HEMATOCRIT: 32 % — AB (ref 34.0–46.6)
Hemoglobin: 10.3 g/dL — ABNORMAL LOW (ref 11.1–15.9)
MCH: 25.3 pg — AB (ref 26.6–33.0)
MCHC: 32.2 g/dL (ref 31.5–35.7)
MCV: 79 fL (ref 79–97)
Platelets: 397 10*3/uL — ABNORMAL HIGH (ref 150–379)
RBC: 4.07 x10E6/uL (ref 3.77–5.28)
RDW: 15.2 % (ref 12.3–15.4)
WBC: 9.3 10*3/uL (ref 3.4–10.8)

## 2016-11-08 LAB — VITAMIN D 25 HYDROXY (VIT D DEFICIENCY, FRACTURES): Vit D, 25-Hydroxy: 25.1 ng/mL — ABNORMAL LOW (ref 30.0–100.0)

## 2016-11-08 MED ORDER — CHOLECALCIFEROL 25 MCG (1000 UT) PO CAPS: 1000.0000 [IU] | ORAL_CAPSULE | Freq: Every day | ORAL | 1 refills | Status: DC

## 2016-11-08 NOTE — Progress Notes (Signed)
Internal Medicine Clinic Attending  Case discussed with Dr. Boswell at the time of the visit.  We reviewed the resident's history and exam and pertinent patient test results.  I agree with the assessment, diagnosis, and plan of care documented in the resident's note.  

## 2016-11-08 NOTE — Assessment & Plan Note (Signed)
Mildly elevated alk phos. Checking vitamin D today.   Plan Mildly decrease Vit D level at 25 Will start Vit D 1000 units daily  Recheck in 6-8 weeks

## 2016-11-08 NOTE — Assessment & Plan Note (Signed)
BP Readings from Last 3 Encounters:  11/07/16 (!) 135/50  09/25/16 (!) 142/62  07/18/16 147/70    Lab Results  Component Value Date   NA 140 03/14/2016   K 3.5 03/14/2016   CREATININE 0.78 03/14/2016    Assessment: BP mildly elevated at last visit. Her medication regimen was consolidated from losartan 100 mg and chlorthalidone 12.5 mg to losartan/HCTZ 100/25 mg daily. BP improved today at 135/50.   Plan: Continue current medications

## 2016-11-08 NOTE — Assessment & Plan Note (Signed)
She was transitioned off Celexa on to Trazodone at last visit. She reports it makes her very somnolent and has only been taking 50 mg qhs due to this. It has cured her insomnia. Reports depression symptoms are much improved today and is related to her energy level from the anemia. PHQ9 today is 3.   Plan Change to trazodone ER 150 mg qhs formulation

## 2016-11-08 NOTE — Assessment & Plan Note (Signed)
Complaints of post nasal drip today. Symptoms for the past 2 weeks.   Plan Will try trial of flonase

## 2016-11-08 NOTE — Assessment & Plan Note (Signed)
CBC at 11/29 visit shwoed microcytic anemia with a low normal ferritin of 39. She was having complaints of fatigue and noted to have nail changes at that time as well. She was started on iron supplements. She reports feeling much better today. Has energy and has improved her depression as well. No clear source of her anemia at this time. She was referred to GI for colonoscopy (overdue for one) to investigate further. Reports the letter went to her neighbor and she just brought it to her.  Plan Checking CBC today. Unchanged from prior.  Peripheral smear pending Follow up with GI

## 2016-11-15 ENCOUNTER — Telehealth: Payer: Self-pay | Admitting: *Deleted

## 2016-11-15 MED ORDER — FLUTICASONE PROPIONATE 50 MCG/ACT NA SUSP: 2.0000 | Freq: Every day | NASAL | 2 refills | Status: DC

## 2016-11-15 NOTE — Telephone Encounter (Signed)
Pt states she never got the flonase could you please send to pharm, thanks!

## 2016-11-15 NOTE — Addendum Note (Signed)
Addended by:  Lions on: 11/15/2016 04:48 PM   Modules accepted: Orders

## 2016-11-18 ENCOUNTER — Ambulatory Visit (INDEPENDENT_AMBULATORY_CARE_PROVIDER_SITE_OTHER): Payer: BLUE CROSS/BLUE SHIELD | Admitting: Internal Medicine

## 2016-11-18 ENCOUNTER — Encounter: Payer: Self-pay | Admitting: Internal Medicine

## 2016-11-18 DIAGNOSIS — J069 Acute upper respiratory infection, unspecified: Secondary | ICD-10-CM

## 2016-11-18 DIAGNOSIS — Z820 Family history of epilepsy and other diseases of the nervous system: Secondary | ICD-10-CM

## 2016-11-18 DIAGNOSIS — Z87891 Personal history of nicotine dependence: Secondary | ICD-10-CM

## 2016-11-18 LAB — PATHOLOGIST SMEAR REVIEW
BASOS ABS: 0 10*3/uL (ref 0.0–0.2)
Basos: 0 %
EOS (ABSOLUTE): 0.3 10*3/uL (ref 0.0–0.4)
EOS: 3 %
HEMATOCRIT: 32.1 % — AB (ref 34.0–46.6)
HEMOGLOBIN: 10.4 g/dL — AB (ref 11.1–15.9)
IMMATURE GRANULOCYTES: 0 %
Immature Grans (Abs): 0 10*3/uL (ref 0.0–0.1)
LYMPHS ABS: 2.6 10*3/uL (ref 0.7–3.1)
LYMPHS: 28 %
MCH: 25.5 pg — AB (ref 26.6–33.0)
MCHC: 32.4 g/dL (ref 31.5–35.7)
MCV: 79 fL (ref 79–97)
MONOS ABS: 0.8 10*3/uL (ref 0.1–0.9)
Monocytes: 9 %
NEUTROS PCT: 60 %
Neutrophils Absolute: 5.5 10*3/uL (ref 1.4–7.0)
PLATELETS: 393 10*3/uL — AB (ref 150–379)
Path Rev WBC: NORMAL
RBC: 4.08 x10E6/uL (ref 3.77–5.28)
RDW: 15.4 % (ref 12.3–15.4)
WBC: 9.2 10*3/uL (ref 3.4–10.8)

## 2016-11-18 NOTE — Patient Instructions (Signed)
You have a viral infection of your sinuses and upper respiratory tract. Unfortunately, we do not have any good antibiotics to treat this infection. However, it should resolve on its own in 10-14 days.  In the meantime, there are several good remedies that will treat your symptoms and hopefully help to make you more comfortable. Please look for one of the over-the-counter medications below that will help with your congestion, cough, and post-nasal drip. Look for active ingredients: guaifenesin and dextromethorphan. Avoid: phenylephrine and pseudoephedrine. Additionally, it will be very important to use nasal irrigation and nasal steroid spray to clear you sinus congestion. I have provided a recipe and instructions below.     BUFFERED ISOTONIC SALINE NASAL IRRIGATION  The Benefits:  1. When you irrigate, the isotonic saline (salt water) acts as a solvent and washes the mucus crusts and other debris from your nose.  2. This decongests and improves the airflow into your nose. The sinus passages begin to open.  3. Studies have also shown that a salt water and an alkaline (baking soda) irrigation solution improves nasal membrane cell function (mucociliary flow of mucus debris).  The Recipe:  1. Choose a 1-quart glass jar that is thoroughly cleansed.  2. Fill with sterile or distilled water, or you can boil water from the tap.  3. Add 1 to 2 heaping teaspoons of "pickling/canning/sea" salt (NOT table salt as it contains a large number of additives). This salt is available at the grocery store in the food canning section.  4. Add 1 teaspoon of [employer] Baking Soda (pure bicarbonate).  5. Mix ingredients together and store at room temperature. Discard after one week. If you find this solution too strong, you may decrease the amount of salt added to 1 to 1  teaspoons. With children it is often best to start with a milder solution and advance slowly. Irrigate with 240 ml (8 oz) twice  daily.  The Instructions:  You should plan to irrigate your nose with buffered isotonic saline 2 times per day. Many people prefer to warm the solution slightly in the microwave - but be sure that the solution is NOT HOT. Stand over the sink (some do this in the shower) and squirt the solution into each side of your nose, keeping your mouth open. This allows you to spit the saltwater out of your mouth. It will not harm you if you swallow a little.  If you have been told to use a nasal steroid such as Flonase, Nasonex, or Nasacort, you should always use isortonic saline solution first, then use your nasal steroid product. The nasal steroid is much more effective when sprayed onto clean nasal membranes and the steroid medicine will reach deeper into the nose.  Most people experience a little burning sensation the first few times they use a isotonic saline solution, but this usually goes away within a few days.

## 2016-11-18 NOTE — Progress Notes (Signed)
CC: URI HPI: [name] is a 66 y.o. female with a h/o of HTN, anemia, t2DM, HLD who presents URI sx.  Please see Problem-based charting for HPI and the status of patient's chronic medical conditions.  Past Medical History:  Diagnosis Date  . Chronic anemia 08/29/2012   Need colonoscopy or report. Need iron panel.   . Depression   . Dyslipidemia 06/30/2007  . Essential hypertension, benign 06/30/2007  . GERD 06/30/2007  . Insomnia 06/11/2012  . Obesity, BMI 35-40 06/11/2012  . Peripheral neuropathy 12/28/2009  . Type 2 diabetes mellitus, uncontrolled, with neuropathy ([place]) 06/30/2007   Social History  Substance Use Topics  . Smoking status: Former Smoker    Quit date: 10/28/1978  . Smokeless tobacco: Never Used  . Alcohol use No   Family History  Problem Relation Age of Onset  . Dementia Mother    Review of Systems: ROS in HPI. Otherwise: Review of Systems  Constitutional: Negative for chills, fever and weight loss.  HENT: Positive for congestion, sinus pain and sore throat. Negative for ear discharge, ear pain and nosebleeds.   Respiratory: Positive for cough and sputum production. Negative for shortness of breath, wheezing and stridor.   Cardiovascular: Negative for chest pain and leg swelling.  Gastrointestinal: Positive for diarrhea and nausea. Negative for abdominal pain, blood in stool, constipation and vomiting.  Genitourinary: Negative for dysuria, frequency and urgency.  Neurological: Negative for dizziness and headaches.   Physical Exam: Vitals:   11/18/16 1552  BP: 99/64  Pulse: 97  Temp: 98.2 F (36.8 C)  TempSrc: Oral  SpO2: 96%  Height: 5\' 3"  (1.6 m)   Physical Exam  Constitutional: She appears well-developed. She is cooperative. No distress.  HENT:  Right Ear: Hearing, tympanic membrane, external ear and ear canal normal.  Left Ear: Hearing, tympanic membrane, external ear and ear canal normal.  Nose: Mucosal edema and rhinorrhea present. Right  sinus exhibits maxillary sinus tenderness. Right sinus exhibits no frontal sinus tenderness. Left sinus exhibits maxillary sinus tenderness. Left sinus exhibits no frontal sinus tenderness.  Mouth/Throat: Mucous membranes are not dry. Posterior oropharyngeal erythema present. No oropharyngeal exudate or posterior oropharyngeal edema.  Neck: Normal range of motion. Neck supple.  Cardiovascular: Normal rate, regular rhythm, normal heart sounds and normal pulses.  Exam reveals no gallop.   No murmur heard. Pulmonary/Chest: Effort normal and breath sounds normal. No respiratory distress. She has no wheezes. She has no rhonchi. She has no rales. Breasts are symmetrical.  Abdominal: Soft. Bowel sounds are normal. There is no tenderness.  Musculoskeletal: She exhibits no edema.  Lymphadenopathy:    She has no cervical adenopathy.    Assessment & Plan:  See encounters tab for problem based medical decision making. Patient discussed with Dr. Dareen Piano  Acute URI She presents with acute onset of sinus congestion, pain, sore throat, cough, diarrhea, nausea. The symptoms began 6 days ago. She reports that diarrhea has resolved since Saturday, she also notes that she has had myalgias and headache which have gotten slightly better over the weekend. She denies any significant shortness of breath or chest pain. She also reports some subjective fevers and chills.  Patient had no abnormalities on ear exam, she had mild tenderness to palpation over maxillary sinuses, she had mild erythema in the oropharynx. Her blood pressure was slightly low at 99/64 today. This is in the setting of several days of diarrhea, poor by mouth intake, and her concurrent diuretic with losartan-hydrochlorothiazide.  It  seems most likely the patient had flu virus or some other respiratory viral syndrome. She does report taking a flu shot this year. However she is outside the window for Tamiflu.  Plan: Provide symptomatically  management. Recommend intranasal steroids, nasal saline irrigation, guaifenesin, dextromethorphan. Also recommend holding blood pressure medication until the patient by mouth intake has returned to normal. Counseled about signs and symptoms of dehydration and recommended that she present to clinic again if she should experience any dizziness, lightheadedness or worsening of her cold and flu symptoms.   Signed: Holley Raring, MD 11/18/2016, 4:45 PM  Pager: [phone]

## 2016-11-18 NOTE — Assessment & Plan Note (Signed)
She presents with acute onset of sinus congestion, pain, sore throat, cough, diarrhea, nausea. The symptoms began 6 days ago. She reports that diarrhea has resolved since Saturday, she also notes that she has had myalgias and headache which have gotten slightly better over the weekend. She denies any significant shortness of breath or chest pain. She also reports some subjective fevers and chills.  Patient had no abnormalities on ear exam, she had mild tenderness to palpation over maxillary sinuses, she had mild erythema in the oropharynx. Her blood pressure was slightly low at 99/64 today. This is in the setting of several days of diarrhea, poor by mouth intake, and her concurrent diuretic with losartan-hydrochlorothiazide.  It seems most likely the patient had flu virus or some other respiratory viral syndrome. She does report taking a flu shot this year. However she is outside the window for Tamiflu.  Plan: Provide symptomatically management. Recommend intranasal steroids, nasal saline irrigation, guaifenesin, dextromethorphan. Also recommend holding blood pressure medication until the patient by mouth intake has returned to normal. Counseled about signs and symptoms of dehydration and recommended that she present to clinic again if she should experience any dizziness, lightheadedness or worsening of her cold and flu symptoms.

## 2016-11-21 NOTE — Progress Notes (Signed)
Internal Medicine Clinic Attending  Case discussed with Dr. Strelow at the time of the visit.  We reviewed the resident's history and exam and pertinent patient test results.  I agree with the assessment, diagnosis, and plan of care documented in the resident's note.  

## 2016-11-25 ENCOUNTER — Telehealth: Payer: Self-pay | Admitting: *Deleted

## 2016-11-25 NOTE — Telephone Encounter (Signed)
Prior Authorization request for Sherri Gill was submitted to CoverMyMeds on 11/21/2016.  Approved 11-21-2016 thru 10/27/2038.  Sander Nephew, RN 11/25/2016 10:52 AM.

## 2016-12-09 ENCOUNTER — Telehealth: Payer: Self-pay | Admitting: *Deleted

## 2016-12-09 NOTE — Telephone Encounter (Signed)
Patient called crying saying " I'm feeling so bad, I feel like I have nothing to live for". I asked what would make her feel feel better right now. She said " I think if I can get somebody to take me to my mother's grave to visit I will feel better."  Patient had lost her mom & sister & recently was sick with the flu. She is worried about going back to work tomorrow. She is in [place] right now & wants to drive back here in [place] but stated she doesn't feel safe driving.  Advised to stay where she's at @ this time, call work & when she feels better tomorrow & feel able to drive then she can call & come in to be seen. She stated she was on Citalopram but d/cd by MD due to heart condition. She has two children & has friends that she can talk to. After our conversation patient had stopped crying & appreciative. She agreed with plan.

## 2016-12-10 NOTE — Telephone Encounter (Signed)
Excellent. I think that is completely reasonable.

## 2016-12-10 NOTE — Telephone Encounter (Signed)
Patient called saying she's feeling better than yesterday. She would like to come in to see a doctor. Appt w/ acc 2/14.

## 2016-12-10 NOTE — Addendum Note (Signed)
Addended by: Hulan Fray on: 12/10/2016 07:48 PM   Modules accepted: Orders

## 2016-12-11 ENCOUNTER — Other Ambulatory Visit: Payer: Self-pay | Admitting: Internal Medicine

## 2016-12-11 ENCOUNTER — Ambulatory Visit (INDEPENDENT_AMBULATORY_CARE_PROVIDER_SITE_OTHER): Payer: BLUE CROSS/BLUE SHIELD | Admitting: Internal Medicine

## 2016-12-11 ENCOUNTER — Encounter (INDEPENDENT_AMBULATORY_CARE_PROVIDER_SITE_OTHER): Payer: Self-pay

## 2016-12-11 ENCOUNTER — Encounter: Payer: Self-pay | Admitting: Internal Medicine

## 2016-12-11 VITALS — BP 149/64 | HR 87 | Temp 97.9°F | Ht 63.0 in

## 2016-12-11 DIAGNOSIS — F332 Major depressive disorder, recurrent severe without psychotic features: Secondary | ICD-10-CM | POA: Diagnosis not present

## 2016-12-11 DIAGNOSIS — IMO0002 Reserved for concepts with insufficient information to code with codable children: Secondary | ICD-10-CM

## 2016-12-11 DIAGNOSIS — E1165 Type 2 diabetes mellitus with hyperglycemia: Principal | ICD-10-CM

## 2016-12-11 DIAGNOSIS — E1140 Type 2 diabetes mellitus with diabetic neuropathy, unspecified: Secondary | ICD-10-CM

## 2016-12-11 MED ORDER — VENLAFAXINE HCL ER 37.5 MG PO CP24: 37.5000 mg | ORAL_CAPSULE | Freq: Every day | ORAL | 2 refills | Status: DC

## 2016-12-11 MED ORDER — TRAZODONE HCL 100 MG PO TABS: 100.0000 mg | ORAL_TABLET | Freq: Every day | ORAL | 2 refills | Status: DC

## 2016-12-11 NOTE — Progress Notes (Signed)
   CC: follow-up of depression  HPI:  Sherri Gill is a 66 y.o. F who presents to the acute care clinic with complaints of depression. She has been on Celexa for many years (chart shows 2012-2017) however was transitioned off Celexa 08/2016 due to concerns for prolonged QT (452) and was started on Trazodone 50 mg QHS at that time. She presents today for follow up. The patient reports her depression has worsened since her last visit. She is having a very hard time dealing with the death of her mother 1 year ago and also the death of her sister recently. She reports feeling alone and without any support. She reports its nearly impossible to get out of bed and she isn't taking care of herself or her house like she used to. She is concerned that her depression will never get better. Denies any thoughts or plans of wanting to hurt herself, however would not mind if she were to pass away. She denies any thoughts of wanting to harm others.   Past Medical History:  Diagnosis Date  . Chronic anemia 08/29/2012   Need colonoscopy or report. Need iron panel.   . Depression   . Dyslipidemia 06/30/2007  . Essential hypertension, benign 06/30/2007  . GERD 06/30/2007  . Insomnia 06/11/2012  . Obesity, BMI 35-40 06/11/2012  . Peripheral neuropathy 12/28/2009  . Type 2 diabetes mellitus, uncontrolled, with neuropathy ([place]) 06/30/2007    Review of Systems:  Review of Systems  Constitutional: Positive for malaise/fatigue. Negative for chills, fever and weight loss.  Eyes: Positive for pain. Negative for blurred vision and double vision.  Respiratory: Negative for cough and shortness of breath.   Cardiovascular: Positive for chest pain (tightness, during anxiety attacks) and palpitations. Negative for leg swelling.  Gastrointestinal: Negative for abdominal pain, nausea and vomiting.  Neurological: Negative for dizziness, sensory change, focal weakness and headaches.  Psychiatric/Behavioral: Positive for depression.  Negative for hallucinations, substance abuse and suicidal ideas. The patient is nervous/anxious and has insomnia (improved).    Physical Exam: Physical Exam  Constitutional: She is oriented to person, place, and time. She appears well-developed and well-nourished.  Tearful and crying throughout first half of exam. Improved with conversation  Cardiovascular: Normal rate, regular rhythm and normal heart sounds.   Pulmonary/Chest: Effort normal and breath sounds normal. No respiratory distress. She has no wheezes.  Abdominal: Soft. Bowel sounds are normal. She exhibits no distension. There is no tenderness.  Neurological: She is alert and oriented to person, place, and time.  Skin: Skin is warm and dry. She is not diaphoretic.  Psychiatric: She is slowed and withdrawn. She exhibits a depressed mood. She expresses no suicidal plans and no homicidal plans.  Mood improved significantly with breif talk therapy   Vitals:   12/11/16 1410  BP: (!) 149/64  Pulse: 87  Temp: 97.9 F (36.6 C)  TempSrc: Oral  SpO2: 100%  Height: 5\' 3"  (1.6 m)    Assessment & Plan:   See Encounters Tab for problem based charting.  Patient discussed with Dr. Daryll Drown

## 2016-12-11 NOTE — Patient Instructions (Addendum)
It was an absolute pleasure meeting you today! Thank you for coming to your follow-up appointment in the clinic.   1. Today we talked about depression. We recently changed your antidepressant to a medication that helps with insomnia as well. I would like to add another antidepressant to your regimen. I am starting you on Effexor which does not affect your heart like the last antidepressant did. Please take one of these a day. It might take several weeks to notice significant improvement.  2. Today we also talked about speaking with a  Counselor. I am referring you to [facility] as they have a combined program with psychiatry, psychology and group therapy. They will call you with an appointment.  3. If your symptoms worsen or if you develop thoughts of wanting to hurt yourself or others, call 911 immediately.

## 2016-12-12 ENCOUNTER — Encounter: Payer: Self-pay | Admitting: Internal Medicine

## 2016-12-12 NOTE — Assessment & Plan Note (Addendum)
Patient here with significant grief today and depressive symptoms. Have worsened since last visit. PHQ9 score today 21, was 3 at last visit per chart review. Patient denies any thoughts of wanting to harm herself or others however would not mind if she passed away.  She was recently discontinued from her long-term antidepressant (Celexa) due to concerns of prolonged QT. Unfortunately this was in the setting of 2 major traumatic events for her. She was started on Trazodone at that time. I suspect there is some element of her being without her SSRI at play here too. Will need to start patient on another agent. Reports good results with Effexor 75 mg many years ago.  -Start Effexor 37.5 mg daily. Consider increasing at follow-up visit if any improvement is noticed -Increased Trazodone to 100mg  daily at bedtime. Reports this medication has helped her insomnia significantly -Have referred patient to behavioral health. She would benefit greatly with continued talk therapy with a Medical illustrator. Her mood improved significantly with basic talk therapy today. In addition, believe she would benefit greatly from group therapy a well.  -Strict return precautions.

## 2016-12-12 NOTE — Telephone Encounter (Signed)
Attempted to call patient regarding psych referral no answer no voicemail set up will call back

## 2016-12-13 ENCOUNTER — Telehealth: Payer: Self-pay

## 2016-12-14 ENCOUNTER — Other Ambulatory Visit: Payer: Self-pay | Admitting: Internal Medicine

## 2016-12-14 DIAGNOSIS — I10 Essential (primary) hypertension: Secondary | ICD-10-CM

## 2016-12-16 NOTE — Telephone Encounter (Signed)
Attempted to contact regarding psych referral no answer will call back

## 2016-12-16 NOTE — Telephone Encounter (Signed)
As this patient was seen by me and deemed necessary for their treatment, I will refill this prescription for losartan/HCTZ.  

## 2016-12-17 ENCOUNTER — Telehealth: Payer: Self-pay

## 2016-12-17 NOTE — Telephone Encounter (Signed)
Call to patient regarding psych referral patient does not want referral sent to behavioral health patient  States she is going to try to find insurance card and will call me back with information to see where she can go with her insurance.

## 2016-12-19 ENCOUNTER — Telehealth: Payer: Self-pay

## 2016-12-19 DIAGNOSIS — IMO0002 Reserved for concepts with insufficient information to code with codable children: Secondary | ICD-10-CM

## 2016-12-19 DIAGNOSIS — E1165 Type 2 diabetes mellitus with hyperglycemia: Principal | ICD-10-CM

## 2016-12-19 DIAGNOSIS — E1140 Type 2 diabetes mellitus with diabetic neuropathy, unspecified: Secondary | ICD-10-CM

## 2016-12-19 NOTE — Telephone Encounter (Signed)
Patient reports that she is fine with [facility] referral she thought I was talking about Deshea Sessions will send to [facility]. Patient reports she needs a new meter because hers keep saying error and she is out of test strips.

## 2016-12-20 MED ORDER — ONETOUCH DELICA LANCETS 33G MISC: 0 refills | Status: DC

## 2016-12-20 MED ORDER — ONETOUCH ULTRA MINI W/DEVICE KIT: PACK | 0 refills | Status: DC

## 2016-12-20 MED ORDER — GLUCOSE BLOOD VI STRP: ORAL_STRIP | 1 refills | Status: DC

## 2016-12-20 NOTE — Telephone Encounter (Signed)
I have sent over testing supplies which should be covered by her insurance plan. She should see Ms. Butch Penny to review testing technique. I had referred her back in November and will refer her again.

## 2016-12-26 NOTE — Progress Notes (Signed)
Internal Medicine Clinic Attending  Case discussed with Dr. Molt soon after the resident saw the patient.  We reviewed the resident's history and exam and pertinent patient test results.  I agree with the assessment, diagnosis, and plan of care documented in the resident's note.  

## 2016-12-31 ENCOUNTER — Encounter: Payer: Self-pay | Admitting: *Deleted

## 2017-01-02 ENCOUNTER — Encounter: Payer: Self-pay | Admitting: Internal Medicine

## 2017-01-02 ENCOUNTER — Ambulatory Visit (INDEPENDENT_AMBULATORY_CARE_PROVIDER_SITE_OTHER): Payer: BLUE CROSS/BLUE SHIELD | Admitting: Internal Medicine

## 2017-01-02 VITALS — BP 151/63 | HR 77 | Temp 97.4°F | Ht 63.5 in | Wt 216.3 lb

## 2017-01-02 DIAGNOSIS — M545 Low back pain, unspecified: Secondary | ICD-10-CM

## 2017-01-02 LAB — POCT URINALYSIS DIPSTICK
Blood, UA: NEGATIVE
Glucose, UA: NEGATIVE
LEUKOCYTES UA: NEGATIVE
Nitrite, UA: NEGATIVE
Protein, UA: 100
Urobilinogen, UA: 1
pH, UA: 5.5

## 2017-01-02 NOTE — Progress Notes (Signed)
   CC: back pain  HPI:  [name] is a 66 y.o. with PMh as listed below is here for lower back pain and also some changes in the urine looking bubbly. Back pain started 2 weeks ago, dull aching pain, 5/10 at worst, has not tried anything for it. No change in the pain with urination or any other activity. No radiation of the pain. No dysuria, vaginal irritation or pain, no hematuria, is able to empty bladder properly. No back injuries recently but did have back pain from lifting her mother in the past who had dementia. No numbness, tingling, weakness.  Past Medical History:  Diagnosis Date  . Chronic anemia 08/29/2012   Need colonoscopy or report. Need iron panel.   . Depression   . Dyslipidemia 06/30/2007  . Essential hypertension, benign 06/30/2007  . GERD 06/30/2007  . Insomnia 06/11/2012  . Obesity, BMI 35-40 06/11/2012  . Peripheral neuropathy 12/28/2009  . Type 2 diabetes mellitus, uncontrolled, with neuropathy ([place]) 06/30/2007     Review of Systems:   Review of Systems  Constitutional: Negative for chills and fever.  Cardiovascular: Negative for chest pain and palpitations.  Gastrointestinal: Negative for constipation, diarrhea, heartburn, nausea and vomiting.  Genitourinary: Negative for dysuria, flank pain, frequency and hematuria.  Musculoskeletal: Positive for back pain.     Physical Exam:  Vitals:   01/02/17 1557 01/02/17 1614  BP: (!) 174/73 (!) 151/63  Pulse: 82 77  Temp: 97.4 F (36.3 C)   TempSrc: Oral   SpO2: 98%   Weight: 216 lb 4.8 oz (98.1 kg)   Height: 5' 3.5" (1.613 m)    Physical Exam  Constitutional: She is oriented to person, place, and time. She appears well-developed and well-nourished. No distress.  HENT:  Head: Normocephalic and atraumatic.  Eyes: Conjunctivae are normal.  Cardiovascular: Normal rate and regular rhythm.  Exam reveals no friction rub.   No murmur heard. Respiratory: Effort normal and breath sounds normal. No respiratory  distress. She has no wheezes.  GI: Soft. Bowel sounds are normal. She exhibits no distension. There is no tenderness.  Musculoskeletal: Normal range of motion. She exhibits no edema or deformity.  Has pain over her lower back muscles. No CVA tenderness. Good ROM of hip and knee joints. Good strength and sensation.  Neurological: She is alert and oriented to person, place, and time.  Skin: She is not diaphoretic.     Assessment & Plan:   See Encounters Tab for problem based charting.  Patient discussed with Dr. Eppie Gibson

## 2017-01-02 NOTE — Assessment & Plan Note (Signed)
Lower back pain b/l is likely from back muscle spasms. No signs of UTI on urine dipstick. Other ddx include kidney stone (has hx of kidney stone remotely 1x but less likely without any radiation of the pain or any blood in the urine). Patient is very anxious about what could be the cause so we spent lot of time re-assuring her.  - will do tylenol and heating pad for her back pain for now. If does not respond, may consider doing back imaging or kidney u/s.

## 2017-01-02 NOTE — Progress Notes (Signed)
Case discussed with Dr. Ahmed at the time of the visit.  We reviewed the resident's history and exam and pertinent patient test results.  I agree with the assessment, diagnosis and plan of care documented in the resident's note. 

## 2017-01-02 NOTE — Patient Instructions (Signed)
Your back pain is likely from back spams.  Take tylenol 500mg  every 4 hours as needed. Use heating pad on your back.  Do the exercises.  If you are not better we will need to do further testing. Let us know if you are not getting better in next 1-2 weeks.

## 2017-01-03 ENCOUNTER — Telehealth: Payer: Self-pay

## 2017-01-03 NOTE — Telephone Encounter (Signed)
I tried calling her directly to discuss but was unable to reach her. Allergy season is starting to blossom, so I recommend she pick up an antihistamine. Should she call back. I'd recommend either Allegra, Clairitin, or Zyrtec. I am happy to call in whichever she prefers. She should also continue using her nasal steroid spray [Flonase or Nasonex].

## 2017-01-03 NOTE — Telephone Encounter (Signed)
Pt needs to speak with a nurse about sore throat. Please call pt back.

## 2017-01-03 NOTE — Telephone Encounter (Signed)
Returned call to patient regarding sore throat she feels drainage running down back of her throat, throat is sore and red, she has hacking cough, and mucus comes up when she gargles states she is afebrile, no body aches. Requesting MD to call her in something

## 2017-01-06 NOTE — Telephone Encounter (Signed)
Tried to call, no vmail set up

## 2017-01-08 NOTE — Telephone Encounter (Signed)
Call to patient no voicemail set up

## 2017-01-15 NOTE — Telephone Encounter (Signed)
I tried to reach her again without success. Should she call back with similar symptoms, feel free to prescribe per the instructions specified earlier in this phone encounter.

## 2017-02-17 NOTE — Addendum Note (Signed)
Addended by: Hulan Fray on: 02/17/2017 07:38 PM   Modules accepted: Orders

## 2017-03-05 NOTE — Addendum Note (Signed)
Addended by: Hulan Fray on: 03/05/2017 06:47 PM   Modules accepted: Orders

## 2017-03-13 ENCOUNTER — Other Ambulatory Visit: Payer: Self-pay | Admitting: Internal Medicine

## 2017-03-13 DIAGNOSIS — D509 Iron deficiency anemia, unspecified: Secondary | ICD-10-CM

## 2017-03-14 ENCOUNTER — Telehealth: Payer: Self-pay | Admitting: Internal Medicine

## 2017-03-14 NOTE — Telephone Encounter (Signed)
Given this patient's severity of depression, I will refill for 30-day supply of medication though efforts from our office to contact her for follow-up have not been successful. I will route a note to the pharmacy to request she contact us to schedule follow-up to reassess her mood disorder.

## 2017-03-14 NOTE — Telephone Encounter (Signed)
As this patient was seen by me and deemed necessary for their treatment, I will refill this prescription for iron. She needs to follow-up for further refills.

## 2017-03-14 NOTE — Telephone Encounter (Signed)
Thank you for reaching out to her on my behalf. She does not have a voicemail set up which complicates our ability to reach her. I also routed a note to her pharmacy for refills I approved today.

## 2017-03-14 NOTE — Telephone Encounter (Signed)
Attempted to contact patient for an appointment.  No answer and voicemail is not set up.  Appt made for 03/25/2017 at 3:45 pm in [facility] and appt card has been put in mail.

## 2017-03-19 ENCOUNTER — Telehealth: Payer: Self-pay

## 2017-03-25 ENCOUNTER — Ambulatory Visit: Payer: BLUE CROSS/BLUE SHIELD

## 2017-03-31 ENCOUNTER — Encounter: Payer: Self-pay | Admitting: *Deleted

## 2017-04-03 ENCOUNTER — Telehealth: Payer: Self-pay | Admitting: Dietician

## 2017-04-03 NOTE — Telephone Encounter (Signed)
rtc to pt, no answer, no vmail 

## 2017-04-03 NOTE — Telephone Encounter (Signed)
Patient called and left voicemail saying that her blood sugar is 172 and it is scaring her. Unable to reach her by phone- voicemail box not set up. Will ask nurse to follow up tomorrow

## 2017-04-17 ENCOUNTER — Encounter: Payer: BLUE CROSS/BLUE SHIELD | Admitting: Internal Medicine

## 2017-04-17 ENCOUNTER — Encounter: Payer: Self-pay | Admitting: Internal Medicine

## 2017-04-24 ENCOUNTER — Other Ambulatory Visit: Payer: Self-pay | Admitting: Internal Medicine

## 2017-05-26 ENCOUNTER — Other Ambulatory Visit: Payer: Self-pay | Admitting: Internal Medicine

## 2017-05-26 NOTE — Telephone Encounter (Signed)
Dr. Maricela Bo will not have any openings until October.  Referring back to sender to see if patient needs to be seen sooner in [facility].

## 2017-05-27 NOTE — Telephone Encounter (Signed)
October will be fine.  Pt can f/u in [facility] sooner if needed.Regenia Skeeter, Darlene Cassady7/31/201810:01 AM

## 2017-06-25 ENCOUNTER — Other Ambulatory Visit: Payer: Self-pay | Admitting: *Deleted

## 2017-06-25 DIAGNOSIS — D509 Iron deficiency anemia, unspecified: Secondary | ICD-10-CM

## 2017-06-25 MED ORDER — IRON 325 (65 FE) MG PO TABS: 1.0000 | ORAL_TABLET | Freq: Every day | ORAL | 0 refills | Status: DC

## 2017-06-25 NOTE — Telephone Encounter (Signed)
Last DM check was Nov. Has [facility] PCP appt. Will refill

## 2017-07-16 ENCOUNTER — Other Ambulatory Visit: Payer: Self-pay | Admitting: *Deleted

## 2017-07-16 ENCOUNTER — Other Ambulatory Visit: Payer: Self-pay

## 2017-07-16 DIAGNOSIS — IMO0002 Reserved for concepts with insufficient information to code with codable children: Secondary | ICD-10-CM

## 2017-07-16 DIAGNOSIS — E1165 Type 2 diabetes mellitus with hyperglycemia: Principal | ICD-10-CM

## 2017-07-16 DIAGNOSIS — E1140 Type 2 diabetes mellitus with diabetic neuropathy, unspecified: Secondary | ICD-10-CM

## 2017-07-16 MED ORDER — METFORMIN HCL 1000 MG PO TABS: 1000.0000 mg | ORAL_TABLET | Freq: Two times a day (BID) | ORAL | 1 refills | Status: DC

## 2017-07-16 NOTE — Telephone Encounter (Signed)
metFORMIN (GLUCOPHAGE) 1000 MG tablet, refill request @ [facility] on [address]. Pt states she is out for a week now, the pharmacy is still waiting for the office to reply back.

## 2017-07-17 NOTE — Telephone Encounter (Signed)
Dr. Maricela Bo not in clinic on 10/19. Patient needs appt in [facility] for Oct when Dr. Maricela Bo will be in [facility]. Thanks!

## 2017-07-18 NOTE — Telephone Encounter (Signed)
This has been done.

## 2017-08-07 ENCOUNTER — Ambulatory Visit (INDEPENDENT_AMBULATORY_CARE_PROVIDER_SITE_OTHER): Payer: BLUE CROSS/BLUE SHIELD | Admitting: Internal Medicine

## 2017-08-07 ENCOUNTER — Ambulatory Visit (INDEPENDENT_AMBULATORY_CARE_PROVIDER_SITE_OTHER): Payer: BLUE CROSS/BLUE SHIELD | Admitting: Dietician

## 2017-08-07 VITALS — BP 140/70 | HR 84 | Temp 97.8°F | Ht 63.5 in | Wt 209.2 lb

## 2017-08-07 DIAGNOSIS — I10 Essential (primary) hypertension: Secondary | ICD-10-CM

## 2017-08-07 DIAGNOSIS — Z713 Dietary counseling and surveillance: Secondary | ICD-10-CM | POA: Diagnosis not present

## 2017-08-07 DIAGNOSIS — E1165 Type 2 diabetes mellitus with hyperglycemia: Secondary | ICD-10-CM | POA: Diagnosis not present

## 2017-08-07 DIAGNOSIS — E119 Type 2 diabetes mellitus without complications: Secondary | ICD-10-CM

## 2017-08-07 DIAGNOSIS — E669 Obesity, unspecified: Secondary | ICD-10-CM

## 2017-08-07 DIAGNOSIS — E785 Hyperlipidemia, unspecified: Secondary | ICD-10-CM

## 2017-08-07 DIAGNOSIS — Z6833 Body mass index (BMI) 33.0-33.9, adult: Secondary | ICD-10-CM

## 2017-08-07 DIAGNOSIS — Z23 Encounter for immunization: Secondary | ICD-10-CM | POA: Diagnosis not present

## 2017-08-07 DIAGNOSIS — Z7984 Long term (current) use of oral hypoglycemic drugs: Secondary | ICD-10-CM

## 2017-08-07 DIAGNOSIS — E1169 Type 2 diabetes mellitus with other specified complication: Secondary | ICD-10-CM | POA: Diagnosis not present

## 2017-08-07 DIAGNOSIS — E7849 Other hyperlipidemia: Secondary | ICD-10-CM | POA: Diagnosis not present

## 2017-08-07 DIAGNOSIS — E1140 Type 2 diabetes mellitus with diabetic neuropathy, unspecified: Secondary | ICD-10-CM | POA: Diagnosis not present

## 2017-08-07 DIAGNOSIS — IMO0002 Reserved for concepts with insufficient information to code with codable children: Secondary | ICD-10-CM

## 2017-08-07 LAB — GLUCOSE, CAPILLARY: Glucose-Capillary: 250 mg/dL — ABNORMAL HIGH (ref 65–99)

## 2017-08-07 LAB — POCT GLYCOSYLATED HEMOGLOBIN (HGB A1C): Hemoglobin A1C: 8.8

## 2017-08-07 MED ORDER — SITAGLIPTIN PHOS-METFORMIN HCL 50-1000 MG PO TABS: 1.0000 | ORAL_TABLET | Freq: Two times a day (BID) | ORAL | 0 refills | Status: DC

## 2017-08-07 MED ORDER — AMLODIPINE BESYLATE 5 MG PO TABS: 5.0000 mg | ORAL_TABLET | Freq: Every day | ORAL | 1 refills | Status: DC

## 2017-08-07 NOTE — Assessment & Plan Note (Addendum)
The patient is currently being prescribed metformin 1000mg  bid. She states that she is compliant with the medication. For the past two weeks the patient reports that she gets queasy in her stomach when she takes metformin without anything to eat. She also has nausea and vomiting. The patient's random blood glucose measurements have ranged 70-250. The patient notes one hypoglycemia like episode last week. She reports that she felt lightheaded and her face felt cold, but felt better after having something to eat.   The patient's HbA1C during this visit was 8.8  and her random glucose was 250. As the patient's blood glucose is not controlled started the patient on Janumet so that she can continue metformin and have the beneficial effects of weight loss and does not have to use an injectable.   -Janumet 50-1000mg  bid  -Educated regarding diet and exercise -Follow up in 1 month

## 2017-08-07 NOTE — Patient Instructions (Signed)
It was a pleasure to meet you today Sherri Gill. During your visit we made the following changes.   -Please stop using metformin -Please start taking Janumet (it is a combination of metformin and januvia) twice a day -Please start taking amlodipine 5mg  daily IN ADDITION TO your other blood pressure medications -Please maintain a healthy diet and exercise at least 30 min daily -Goal weight reduction by next visit=2-3lbs -Please follow up in 1 month   I look forward to continue working with you on your health.   Best,  Lars Mage, MD Internal Medicine PGY1

## 2017-08-07 NOTE — Assessment & Plan Note (Signed)
The patient's last lipid panel was done May 2017 during which time the total cholesterol=212 and ldl=125. The patient is currently taking atorvastatin 40mg  qd.   -Lipid panel ordered

## 2017-08-07 NOTE — Progress Notes (Deleted)
2 weeks of gi intolerance in am after taking metformin  Diarrhea occasionally At home blood glucose has been 70-200.   a1c is 8.8 Repeat bp is 140/70  Cold in face, nausea, disoriented one time last week  pdenies plyurea, polydipsea,    Refill of atorvastatin, metformin, hyzaar, effexor, trazodone she has only been takint 50mg  due to diff waking up

## 2017-08-07 NOTE — Progress Notes (Signed)
  Medical Nutrition Therapy:  Appt start time: 1100 end time:  1200. Visit # 1  Assessment:  Primary concerns today: glycemic control Ms. Whetsel is her with her friend to discuss meal planning to help her with her weight and blood sugars. She tries to eat healthy and realizes she can do better. Her friend is very supportive. Both of them ask very appropriate questions and are interested in label reading  Preferred Learning Style: No preference indicated  Learning Readiness: Ready and some Change in progress  ANTHROPOMETRICS: weight-209#,  BMI-33- obese class 1 WEIGHT HISTORY:need to assess at future visit SLEEP:need to assess at next visit MEDICATIONS: [place] being started, takes metformin  BLOOD SUGAR:A1C 8.8% increased from DIETARY INTAKE: Usual eating pattern includes 2-3 meals and 0-2 snacks per day.Everyday foods include coffee, raison bran, fairlife milk, vegetables  She denies Food Intolerances: Nausea, Vomiting, Diarrhea, Constipation or hair loss.  Dining Out (times/week):need to assess at future visit   24-hr recall:  B ( AM): oatmeal, hard boiled egg, coffee with sweet creamer L ( PM): peanut butter crackers, soda D ( PM): spaghetti,  Beverages: coffee with sweet creamer, regular soda  Usual physical activity: need to assess at future visit   Progress Towards Goal(s):  In progress.   Nutritional Diagnosis:  NI-5.8.2 Excessive carbohydrate intake As related to added sugars in food choices.  As evidenced by her food recall and higher A1C.    Intervention:  Nutrition education about diabetes meal planning, label reading. Coordination of care: none at this time  Teaching Method Utilized: Visual,Auditory,Hands on Handouts given during visit include: meal planning book, label  Barriers to learning/adherence to lifestyle change: none noted today Demonstrated degree of understanding via:  Teach Back   Monitoring/Evaluation:  Dietary intake, exercise, meter, and body  weight in 6 week(s)  Elic Vencill, Butch Penny, RD 08/07/2017 3:27 PM. .

## 2017-08-07 NOTE — Assessment & Plan Note (Addendum)
The patient's blood pressure during this visit was 143/62 Repeat manual reading was 140/70. The patient is currently being prescribed losartan-hydrochlorothiazine 100-25mg  qd. She states that she is compliant with the medication. She reports that her home systolic blood pressure readings have ranged between 150-170.   -Added Amlodipine 5mg  qd  -Continue Hyzaar 100-25mg  qd  -Educated about the importance of diet and exercise -Follow up in one month

## 2017-08-07 NOTE — Progress Notes (Signed)
   CC: GI discomfort  HPI:  [name] is a 66 y.o. with pmh of type 2 diabetes mellitus, essential hypertension, depression, iron deficiency anemia and obesity who presents for GI discomfort. Please see assessment and plan for additional details.   Past Medical History:  Diagnosis Date  . Chronic anemia 08/29/2012   Need colonoscopy or report. Need iron panel.   . Depression   . Dyslipidemia 06/30/2007  . Essential hypertension, benign 06/30/2007  . GERD 06/30/2007  . Insomnia 06/11/2012  . Obesity, BMI 35-40 06/11/2012  . Peripheral neuropathy 12/28/2009  . Type 2 diabetes mellitus, uncontrolled, with neuropathy ([place]) 06/30/2007   Review of Systems:   ROS Per HPI Denies polyurea, polydipsea Diarrhea occasionally  Physical Exam:  Vitals:   08/07/17 0925  BP: (!) 143/62  Pulse: 84  Temp: 97.8 F (36.6 C)  TempSrc: Oral  SpO2: 98%  Weight: 209 lb 3.2 oz (94.9 kg)  Height: 5' 3.5" (1.613 m)   Physical Exam  Constitutional: She appears well-developed. No distress.  HENT:  Head: Normocephalic and atraumatic.  Cardiovascular: Normal rate, regular rhythm and normal heart sounds.   Pulmonary/Chest: Effort normal and breath sounds normal. No respiratory distress. She has no wheezes.  Abdominal: Soft. Bowel sounds are normal. She exhibits no distension. There is no tenderness.  Lymphadenopathy:    She has no cervical adenopathy.  Skin: No rash noted. No erythema.  Psychiatric: She has a normal mood and affect. Her behavior is normal. Judgment and thought content normal.    Assessment & Plan:   See Encounters Tab for problem based charting.  Patient seen with Dr. Dareen Piano

## 2017-08-08 LAB — LIPID PANEL
CHOLESTEROL TOTAL: 140 mg/dL (ref 100–199)
Chol/HDL Ratio: 2.3 ratio (ref 0.0–4.4)
HDL: 62 mg/dL (ref >39)
LDL Calculated: 42 mg/dL (ref 0–99)
Triglycerides: 179 mg/dL — ABNORMAL HIGH (ref 0–149)
VLDL Cholesterol Cal: 36 mg/dL (ref 5–40)

## 2017-08-08 NOTE — Progress Notes (Signed)
Internal Medicine Clinic Attending  I saw and evaluated the patient.  I personally confirmed the key portions of the history and exam documented by Dr. [name] and I reviewed pertinent patient test results.  The assessment, diagnosis, and plan were formulated together and I agree with the documentation in the resident's note. 

## 2017-08-14 ENCOUNTER — Ambulatory Visit: Payer: BLUE CROSS/BLUE SHIELD

## 2017-08-15 ENCOUNTER — Encounter: Payer: BLUE CROSS/BLUE SHIELD | Admitting: Internal Medicine

## 2017-08-28 ENCOUNTER — Encounter: Payer: Self-pay | Admitting: Internal Medicine

## 2017-08-28 ENCOUNTER — Ambulatory Visit (INDEPENDENT_AMBULATORY_CARE_PROVIDER_SITE_OTHER): Payer: BLUE CROSS/BLUE SHIELD | Admitting: Internal Medicine

## 2017-08-28 DIAGNOSIS — D509 Iron deficiency anemia, unspecified: Secondary | ICD-10-CM

## 2017-08-28 DIAGNOSIS — B349 Viral infection, unspecified: Secondary | ICD-10-CM

## 2017-08-28 DIAGNOSIS — R51 Headache: Secondary | ICD-10-CM

## 2017-08-28 DIAGNOSIS — E1140 Type 2 diabetes mellitus with diabetic neuropathy, unspecified: Secondary | ICD-10-CM

## 2017-08-28 DIAGNOSIS — E1165 Type 2 diabetes mellitus with hyperglycemia: Secondary | ICD-10-CM

## 2017-08-28 DIAGNOSIS — Z8661 Personal history of infections of the central nervous system: Secondary | ICD-10-CM

## 2017-08-28 DIAGNOSIS — IMO0002 Reserved for concepts with insufficient information to code with codable children: Secondary | ICD-10-CM

## 2017-08-28 LAB — INFLUENZA PANEL BY PCR (TYPE A & B)
Influenza A By PCR: NEGATIVE
Influenza B By PCR: NEGATIVE

## 2017-08-28 MED ORDER — ATORVASTATIN CALCIUM 40 MG PO TABS: 40.0000 mg | ORAL_TABLET | Freq: Every day | ORAL | 3 refills | Status: DC

## 2017-08-28 MED ORDER — IRON 325 (65 FE) MG PO TABS: 1.0000 | ORAL_TABLET | Freq: Every day | ORAL | 0 refills | Status: DC

## 2017-08-28 NOTE — Assessment & Plan Note (Signed)
Assessment: Patient presents with a 3-day history concerning for flulike symptoms and general viral illness.  Please see HPI  Plan: Flu swab appropriated for influenza AMB test sent. Recommend symptomatic treatment to the patient patient -Maintain fluid intake urine remains clear to lightly yellow -Minimize utilization of Gatorade and Pedialyte given the high glucose content no history of diabetes -Check blood glucose levels at least 2 times daily preferentially as previously recommended given her recent illness -If symptoms worsen please return to clinic in our ED -Flu test was performed today we will notify you of the results

## 2017-08-28 NOTE — Patient Instructions (Signed)
Please continue to monitor your symptoms. If your headache becomes unbearable, please return to the clinic, call the number on call or visit the ED.   Thank you for your visit to the [facility].  Please call the clinic tomorrow afternoon if you have not heard back concerning your influenza testing results.   Follow these instructions at home:  Use a cool mist humidifier to add humidity to the air in your home. This can make breathing easier.  Rest as needed.  Drink enough fluid (minimize Gatorade use given the high level of sugar) to keep your urine clear or pale yellow.  Cover your mouth and nose when you cough or sneeze.  Wash your hands with soap and water often, especially after you cough or sneeze. If soap and water are not available, use hand sanitizer.  Stay home from work or school as told by your health care provider. Unless you are visiting your health care provider, try to avoid leaving home until your fever has been gone for 24 hours without the use of medicine.  Keep all follow-up visits as told by your health care provider. This is important.   You may alternate tylenol with ibuprofen every 4 hours as discussed.   Please monitor your blood glucose levels at least twice daily while you are ill. Contact a health care provider if:  You develop new symptoms.  You have: ? Worsening Chest pain. ? Diarrhea that does not resolve. ? A fever of greater than 101.  Your cough gets worse.  You produce more mucus.  You feel nauseous or your vomiting does not resolve. Get help right away if:  You develop shortness of breath or difficulty breathing.  Your skin or nails turn a bluish color.  You develop a worsening headache or sudden pain in your face  You cannot stop vomiting.

## 2017-08-28 NOTE — Progress Notes (Signed)
   CC: Overall feeling unwell  HPI:  [name] is a 66 y.o. female who presents with a three day history of diarrhea with headaches, nausea, malaise, chills, cough, subjective fever, neck stiffness, yellowish sputum production and pleuritic chest pain for 36 hours. Symptoms are minimally relieved with tylenol temporarily. Patient states that this presentation is similar to her previous episode of meningitis with the last event in 2015 with the following exceptions: Neck pain much less severe, and her current headache is not worsened by bright lights, sound movement or driving.Sherri Gill nose bleeds at random without trauma for many years.  Patient denies hematuria, dysuria, hemoptysis, bloody stool, visual changes, sweating, or abdominal pain.   Past Medical History:  Diagnosis Date  . Chronic anemia 08/29/2012   Need colonoscopy or report. Need iron panel.   . Depression   . Dyslipidemia 06/30/2007  . Essential hypertension, benign 06/30/2007  . GERD 06/30/2007  . Insomnia 06/11/2012  . Obesity, BMI 35-40 06/11/2012  . Peripheral neuropathy 12/28/2009  . Type 2 diabetes mellitus, uncontrolled, with neuropathy ([place]) 06/30/2007   Review of Systems:  ROS negative except as per HPI.  Physical Exam:  Vitals:   08/28/17 1547  BP: (!) 141/55  Pulse: 94  Temp: 99.1 F (37.3 C)  TempSrc: Oral  SpO2: 96%  Weight: 203 lb (92.1 kg)   Physical Exam  Constitutional: She is oriented to person, place, and time. She appears well-developed and well-nourished. No distress.  Cardiovascular: Normal rate and regular rhythm.   No murmur heard. Pulmonary/Chest: Effort normal and breath sounds normal. No respiratory distress.  Abdominal: Soft. Bowel sounds are normal. She exhibits no distension. There is no tenderness.  Musculoskeletal: She exhibits no edema or tenderness.  Neurological: She is alert and oriented to person, place, and time.  Skin: Skin is warm. Capillary refill takes less than 2  seconds.  Psychiatric: She has a normal mood and affect.  Vitals reviewed.   Assessment & Plan:   See Encounters Tab for problem based charting.  Patient seen with Dr. Evette Doffing

## 2017-08-28 NOTE — Assessment & Plan Note (Addendum)
Assessment: Patient no recent labs however given her history of iron deficiency anemia recommend that she continue taking her ferrous tablet.  Plan: Refill scription for ferrous sulfate tablets

## 2017-08-29 NOTE — Progress Notes (Signed)
Internal Medicine Clinic Attending  I saw and evaluated the patient.  I personally confirmed the key portions of the history and exam documented by [name] and I reviewed pertinent patient test results.  The assessment, diagnosis, and plan were formulated together and I agree with the documentation in the resident's note.  Clinical course and exam most consistent with viral URI. I called her this morning to report negative flu swab. She is feeling about the same as yesterday. Low suspicion for recurrent HSV meningitis because no photophobia, no meningismus, and she reports this feels different. I advised continuing supportive measures, if no better by Monday come back for re-evaluation. I also advised coming to ED if symptoms worsen.

## 2017-09-01 ENCOUNTER — Ambulatory Visit (INDEPENDENT_AMBULATORY_CARE_PROVIDER_SITE_OTHER): Payer: BLUE CROSS/BLUE SHIELD | Admitting: Internal Medicine

## 2017-09-01 ENCOUNTER — Telehealth: Payer: Self-pay

## 2017-09-01 VITALS — BP 149/67 | HR 98 | Temp 97.7°F | Ht 63.5 in | Wt 206.3 lb

## 2017-09-01 DIAGNOSIS — B349 Viral infection, unspecified: Secondary | ICD-10-CM

## 2017-09-01 MED ORDER — BENZONATATE 100 MG PO CAPS: 100.0000 mg | ORAL_CAPSULE | Freq: Three times a day (TID) | ORAL | 0 refills | Status: DC | PRN

## 2017-09-01 MED ORDER — ALBUTEROL SULFATE HFA 108 (90 BASE) MCG/ACT IN AERS: 2.0000 | INHALATION_SPRAY | Freq: Four times a day (QID) | RESPIRATORY_TRACT | 0 refills | Status: DC | PRN

## 2017-09-01 NOTE — Progress Notes (Signed)
CC: Continued systemic viral illness symptoms  HPI:  [name] is a 66 y.o. female with PMHx detailed below presenting with ongoing productive coughing, wheezing, body aches, and fatigue since last Thursday.  See problem based assessment and plan below for additional details.  Systemic viral illness HPI: She is now about 7 days into symptoms of flu-like illness with cough, wheezing, nightly chills, and diffuse body aches. Symptoms are partially improved compared to last Thursday. She has noticed increased yellow colored sputum production with her cough. The coughing is severe enough that it causes chest pain. She has wheezes intermittently but does not feel short of breath with activity or coughing. She has been able to drink lots of fluids without nausea or vomiting, but feels mildly constipated now. A: Viral illness with systemic symptoms. She is still in an expected duration of symptoms as severe viral illness would commonly persist anywhere 7-14 days before resolution. Her increased productive cough with associated wheezing may be the onset of an acute bronchitis. This does not wound like a focal consolidation and she is not high risk for complication with no underlying lung disease. She states she used an inhaler as needed in the past but a long time ago. She is afebrile any likely not very contagious already. P: Continue supportive treatment with tylenol, NSAIDs, hydration Prescribed tessalon TID PRN (History of codeine intolerance) Prescribed albuterol q6hr PRN for wheezing with exertion or coughing Recommended she call back to clinic or present if new work note is needed when feeling able to return    Past Medical History:  Diagnosis Date  . Chronic anemia 08/29/2012   Need colonoscopy or report. Need iron panel.   . Depression   . Dyslipidemia 06/30/2007  . Essential hypertension, benign 06/30/2007  . GERD 06/30/2007  . Insomnia 06/11/2012  . Obesity, BMI 35-40 06/11/2012    . Peripheral neuropathy 12/28/2009  . Type 2 diabetes mellitus, uncontrolled, with neuropathy ([place]) 06/30/2007    Review of Systems: Review of Systems  Constitutional: Positive for chills, fever and malaise/fatigue. Negative for diaphoresis.  HENT: Positive for congestion and sore throat. Negative for hearing loss, sinus pain and tinnitus.   Eyes: Negative for blurred vision.  Respiratory: Positive for cough, sputum production and wheezing. Negative for shortness of breath.   Cardiovascular: Positive for chest pain. Negative for leg swelling.  Gastrointestinal: Positive for constipation. Negative for abdominal pain and diarrhea.  Genitourinary: Negative for dysuria.  Musculoskeletal: Positive for joint pain and myalgias. Negative for falls.  Skin: Negative for rash.  Neurological: Positive for dizziness and headaches. Negative for sensory change and focal weakness.  Endo/Heme/Allergies: Negative for environmental allergies.     Physical Exam: Vitals:   09/01/17 1458  BP: (!) 149/67  Pulse: 98  Temp: 97.7 F (36.5 C)  TempSrc: Oral  SpO2: 98%  Weight: 206 lb 4.8 oz (93.6 kg)  Height: 5' 3.5" (1.613 m)   GENERAL- alert, co-operative, uncomfortable but in no acute distress HEENT- No tenderness to palpation over sinuses, no cervical lymphadenopathy, mild posterior oropharyngeal erythema, no conjunctival injection, normal neck ROM with muscle tenderness to palpation CARDIAC- RRR, no murmurs, rubs or gallops. RESP- Diffuse expiratory wheezing with fair air movement, bibasilar inspiratory crackles, no accessory muscle recruitment ABDOMEN- Soft, nontender, no guarding or rebound EXTREMITIES- Symmetric, no pedal edema, no joint effusion. SKIN- Warm, dry, No rash or lesion. PSYCH- Normal mood and affect, appropriate thought content and speech.   Assessment & Plan:   See encounters tab  for problem based medical decision making.  Patient discussed with Dr. Daryll Drown

## 2017-09-01 NOTE — Patient Instructions (Signed)
It was a pleasure to see you today Sherri Gill.  I think your symptoms are an acute bronchitis due to a bad viral infection. This virus is similar to influenza so fatigue, chills, body aches, and cough may continue for several more days before improving well. I sent a prescription for another cough medicine you can take up to 3 times daily. You may also get a short acting inhaler if you find that you are wheezing more or getting short of breath when coughing or during activity. I expect your symptoms will start to improve noticeably by mid week.  You would be safe to return to work whenever you have no fever, but can use your discretion regarding physical readiness.

## 2017-09-01 NOTE — Telephone Encounter (Signed)
ERROR

## 2017-09-02 NOTE — Assessment & Plan Note (Addendum)
HPI: She is now about 7 days into symptoms of flu-like illness with cough, wheezing, nightly chills, and diffuse body aches. Symptoms are partially improved compared to last Thursday. She has noticed increased yellow colored sputum production with her cough. The coughing is severe enough that it causes chest pain. She has wheezes intermittently but does not feel short of breath with activity or coughing. She has been able to drink lots of fluids without nausea or vomiting, but feels mildly constipated now. A: Viral illness with systemic symptoms. She is still in an expected duration of symptoms as severe viral illness would commonly persist anywhere 7-14 days before resolution. Her increased productive cough with associated wheezing may be the onset of an acute bronchitis. This does not wound like a focal consolidation and she is not high risk for complication with no underlying lung disease. She states she used an inhaler as needed in the past but a long time ago. She is afebrile any likely not very contagious already. P: Continue supportive treatment with tylenol, NSAIDs, hydration Prescribed tessalon TID PRN (History of codeine intolerance) Prescribed albuterol q6hr PRN for wheezing with exertion or coughing Recommended she call back to clinic or present if new work note is needed when feeling able to return

## 2017-09-03 NOTE — Progress Notes (Signed)
Internal Medicine Clinic Attending  Case discussed with Dr. Rice at the time of the visit.  We reviewed the resident's history and exam and pertinent patient test results.  I agree with the assessment, diagnosis, and plan of care documented in the resident's note.  

## 2017-09-08 ENCOUNTER — Other Ambulatory Visit: Payer: Self-pay

## 2017-09-08 ENCOUNTER — Ambulatory Visit (INDEPENDENT_AMBULATORY_CARE_PROVIDER_SITE_OTHER): Payer: BLUE CROSS/BLUE SHIELD | Admitting: Internal Medicine

## 2017-09-08 ENCOUNTER — Encounter: Payer: Self-pay | Admitting: Internal Medicine

## 2017-09-08 DIAGNOSIS — B349 Viral infection, unspecified: Secondary | ICD-10-CM

## 2017-09-08 NOTE — Progress Notes (Signed)
   CC: Cough, Nose bleed  HPI:  [name] is a 66 y.o. female with a past medical history listed below here today with continued viral URI symptoms and new nose bleed.  She has been seen on 11/1 and 11/5 with similar complaints. Reports that she continues to have cough with yellow sputum production, wheezing, and chills. Reports her symptoms are slowly improving but still does not feel well. She reports taking Robitussin, Tylenol, and Ibuprofen prn with some improvement in symptoms. Denies any fevers, shortness of breath, chest pain, sore throat, ear pain or loss of hearing, itching eyes, nausea or vomiting. Does note rhinorrhea but denies any sinus congestion or pressure. She is concerned today because she developed a nose bleed yesterday that recurred twice. She denies using any nasal sprays or picking her nose. She appears to have a significant amount of anxiety over her current illness. Recounted a story of a co-worker who died last year after having similar symptoms though she is unaware of what the co-worker had at that time.   Past Medical History:  Diagnosis Date  . Chronic anemia 08/29/2012   Need colonoscopy or report. Need iron panel.   . Depression   . Dyslipidemia 06/30/2007  . Essential hypertension, benign 06/30/2007  . GERD 06/30/2007  . Insomnia 06/11/2012  . Obesity, BMI 35-40 06/11/2012  . Peripheral neuropathy 12/28/2009  . Type 2 diabetes mellitus, uncontrolled, with neuropathy ([place]) 06/30/2007   Review of Systems:   Negative except as noted in HPI  Physical Exam:  Vitals:   09/08/17 1612  BP: (!) 144/68  Pulse: 80  Temp: (!) 97.5 F (36.4 C)  TempSrc: Oral  SpO2: 95%  Height: 5' 3.5" (1.613 m)   Physical Exam  Constitutional: She is well-developed, well-nourished, and in no distress. No distress.  HENT:  Head: Normocephalic and atraumatic.  Right Ear: External ear normal.  Left Ear: External ear normal.  Nose: Nose normal.  Mouth/Throat: Oropharynx is  clear and moist. No oropharyngeal exudate.  Eyes: Conjunctivae and EOM are normal. Pupils are equal, round, and reactive to light. Right eye exhibits no discharge. Left eye exhibits no discharge. No scleral icterus.  Neck: Normal range of motion.  Cardiovascular: Normal rate, regular rhythm and normal heart sounds.  Pulmonary/Chest: Effort normal and breath sounds normal. No stridor. No respiratory distress. She has no rales.  Upper airway expiratory wheezing, lung fields clear without crackles or rales with good air movement. No accessory muscle usage.   Abdominal: Soft. Bowel sounds are normal.  Lymphadenopathy:    She has no cervical adenopathy.  Skin: Skin is warm and dry. No rash noted.     Assessment & Plan:   See Encounters Tab for problem based charting.  Patient discussed with Dr. Angelia Mould

## 2017-09-08 NOTE — Patient Instructions (Signed)
Sherri Gill,  I am sorry you are still feeling poorly. I agree with the previous doctors that this is from a bad virus that is causing bronchitis. It can take several weeks for your symptoms to completely resolve. I would like for you to fill the prescription for albuterol. This can help with the coughing, wheezing and help break up the mucus you are having so you can get it out. Continue the Robitussin, Tylenol, Ibuprofen as needed as well. The nose bleed is from drying out the mucus lining in your nose and is nothing to be concerned about.  If you start having fevers, shortness of breath, chest pain please call us and let us know.

## 2017-09-08 NOTE — Assessment & Plan Note (Signed)
Will continue symptomatic treatment outlined in Dr. Marveen Reeks note from 11/5. She has not tried the albuterol inhaler and recommended she filled this Rx. No evidence of PNA on exam today.

## 2017-09-09 NOTE — Progress Notes (Signed)
Internal Medicine Clinic Attending  Case discussed with Dr. Boswell at the time of the visit.  We reviewed the resident's history and exam and pertinent patient test results.  I agree with the assessment, diagnosis, and plan of care documented in the resident's note.  

## 2017-09-12 ENCOUNTER — Encounter: Payer: Self-pay | Admitting: Internal Medicine

## 2017-09-15 ENCOUNTER — Ambulatory Visit (HOSPITAL_COMMUNITY)
Admission: RE | Admit: 2017-09-15 | Discharge: 2017-09-15 | Disposition: A | Discharge status: Home / Self Care | Payer: BLUE CROSS/BLUE SHIELD | Source: Ambulatory Visit | Attending: Internal Medicine | Admitting: Internal Medicine

## 2017-09-15 ENCOUNTER — Ambulatory Visit (INDEPENDENT_AMBULATORY_CARE_PROVIDER_SITE_OTHER): Payer: BLUE CROSS/BLUE SHIELD | Admitting: Internal Medicine

## 2017-09-15 ENCOUNTER — Encounter: Payer: Self-pay | Admitting: Internal Medicine

## 2017-09-15 ENCOUNTER — Other Ambulatory Visit: Payer: Self-pay

## 2017-09-15 VITALS — BP 154/64 | HR 98 | Temp 97.8°F | Ht 63.0 in

## 2017-09-15 DIAGNOSIS — R918 Other nonspecific abnormal finding of lung field: Secondary | ICD-10-CM | POA: Diagnosis not present

## 2017-09-15 DIAGNOSIS — I10 Essential (primary) hypertension: Secondary | ICD-10-CM

## 2017-09-15 DIAGNOSIS — J209 Acute bronchitis, unspecified: Secondary | ICD-10-CM

## 2017-09-15 DIAGNOSIS — E785 Hyperlipidemia, unspecified: Secondary | ICD-10-CM

## 2017-09-15 MED ORDER — DOXYCYCLINE HYCLATE 100 MG PO CAPS: 100.0000 mg | ORAL_CAPSULE | Freq: Two times a day (BID) | ORAL | 0 refills | Status: DC

## 2017-09-15 NOTE — Progress Notes (Signed)
   CC: productive cough and feeling unwell  HPI:  Sherri Gill is a 66 y.o. female who presents today for evaluation of her productive cough. She initially presented multiple times to the clinic for mild cough which initially improved but has recently worsened. Yellow, purulent sputum is being produced. Denied fever, chills, night sweats, weight loss, muscle aches, diarrhea, constipation, chest pain, urinary frequency, dysuria, visual changes, or headache. Attested to productive cough and rhinorrhea.  Patient requested refill for Losartan. She was advised that she should yet have a refill remaining but to call back if this is not so.  Past Medical History:  Diagnosis Date  . Chronic anemia 08/29/2012   Need colonoscopy or report. Need iron panel.   . Depression   . Dyslipidemia 06/30/2007  . Essential hypertension, benign 06/30/2007  . GERD 06/30/2007  . Insomnia 06/11/2012  . Obesity, BMI 35-40 06/11/2012  . Peripheral neuropathy 12/28/2009  . Type 2 diabetes mellitus, uncontrolled, with neuropathy ([place]) 06/30/2007   Review of Systems:  ROS negative except as per HPI.  Physical Exam:  Vitals:   09/15/17 1014  BP: (!) 154/64  Pulse: 98  Temp: 97.8 F (36.6 C)  TempSrc: Oral  SpO2: 100%  Height: 5\' 3"  (1.6 m)   Physical Exam  Constitutional: She appears well-nourished. No distress.  Cardiovascular: Normal rate and regular rhythm.  No murmur heard. Pulmonary/Chest: Effort normal. No stridor. No respiratory distress. She has rhonchi in the right upper field, the right lower field, the left upper field and the left lower field.  Abdominal: Soft. Bowel sounds are normal. She exhibits no distension.  Musculoskeletal: She exhibits no edema or tenderness.    Assessment & Plan:   See Encounters Tab for problem based charting.  Patient seen with Dr. Eppie Gibson

## 2017-09-15 NOTE — Patient Instructions (Signed)
We have ordered chest xray's to better determine the location of the inflammation. After completion of the imaging and review we will notify you of the antibiotic that is recommended at that time.  You should continue all of your previous treatment methods that were explained during the three previous visits this month. Continue to stay hydrated, and to use your robitussin as needed.  If at any time you worsen please contact our clinic or visit the ED.   Thank you for your visit to the [facility].

## 2017-09-15 NOTE — Assessment & Plan Note (Addendum)
Assessment: See HPI for details. Rhonchi appreciated on exam.  Plan: Chest x-ray ordered, Antibiotics pending results of xray, will go with doxy vs fluoro.  Xray failed to demonstrate acute intrapulmonary disease. Placed order for doxy 100mg  BID for five days. Patient was notified as well via phone.

## 2017-09-15 NOTE — Progress Notes (Signed)
I saw and evaluated the patient. I personally confirmed the key portions of Dr. Vicente Serene history and exam and reviewed pertinent patient test results. The assessment, diagnosis, and plan were formulated together and I agree with the documentation in the resident's note.

## 2017-09-26 ENCOUNTER — Ambulatory Visit (INDEPENDENT_AMBULATORY_CARE_PROVIDER_SITE_OTHER): Payer: BLUE CROSS/BLUE SHIELD | Admitting: Internal Medicine

## 2017-09-26 ENCOUNTER — Other Ambulatory Visit: Payer: Self-pay

## 2017-09-26 ENCOUNTER — Encounter: Payer: Self-pay | Admitting: Internal Medicine

## 2017-09-26 VITALS — BP 130/70 | HR 94 | Temp 97.9°F | Ht 63.0 in | Wt 201.4 lb

## 2017-09-26 DIAGNOSIS — D509 Iron deficiency anemia, unspecified: Secondary | ICD-10-CM

## 2017-09-26 DIAGNOSIS — F329 Major depressive disorder, single episode, unspecified: Secondary | ICD-10-CM | POA: Diagnosis not present

## 2017-09-26 DIAGNOSIS — Z87891 Personal history of nicotine dependence: Secondary | ICD-10-CM

## 2017-09-26 DIAGNOSIS — Z79899 Other long term (current) drug therapy: Secondary | ICD-10-CM

## 2017-09-26 DIAGNOSIS — I10 Essential (primary) hypertension: Secondary | ICD-10-CM

## 2017-09-26 DIAGNOSIS — R040 Epistaxis: Secondary | ICD-10-CM

## 2017-09-26 DIAGNOSIS — R140 Abdominal distension (gaseous): Secondary | ICD-10-CM

## 2017-09-26 DIAGNOSIS — J209 Acute bronchitis, unspecified: Secondary | ICD-10-CM | POA: Diagnosis not present

## 2017-09-26 DIAGNOSIS — Z0000 Encounter for general adult medical examination without abnormal findings: Secondary | ICD-10-CM

## 2017-09-26 DIAGNOSIS — E1165 Type 2 diabetes mellitus with hyperglycemia: Secondary | ICD-10-CM

## 2017-09-26 DIAGNOSIS — K219 Gastro-esophageal reflux disease without esophagitis: Secondary | ICD-10-CM | POA: Diagnosis not present

## 2017-09-26 DIAGNOSIS — E1140 Type 2 diabetes mellitus with diabetic neuropathy, unspecified: Secondary | ICD-10-CM | POA: Diagnosis not present

## 2017-09-26 DIAGNOSIS — Z7984 Long term (current) use of oral hypoglycemic drugs: Secondary | ICD-10-CM | POA: Diagnosis not present

## 2017-09-26 DIAGNOSIS — IMO0002 Reserved for concepts with insufficient information to code with codable children: Secondary | ICD-10-CM

## 2017-09-26 MED ORDER — HYDROCODONE-HOMATROPINE 5-1.5 MG/5ML PO SYRP: 5.0000 mL | ORAL_SOLUTION | Freq: Four times a day (QID) | ORAL | 0 refills | Status: DC | PRN

## 2017-09-26 MED ORDER — FLUTICASONE PROPIONATE 50 MCG/ACT NA SUSP: 2.0000 | Freq: Every day | NASAL | 2 refills | Status: DC

## 2017-09-26 NOTE — Patient Instructions (Addendum)
It was a pleasure to see you today Ms. Rakestraw. Please make the following changes:  -Please continue using albuterol inhaler for your shortness of breath -please start taking flonase -please take hycodan syrup for your cough  If you have any questions or concerns, please call our clinic at [phone] between 9am-5pm and after hours call [phone] and ask for the internal medicine resident on call. If you feel you are having a medical emergency please call 911.   Thank you, we look forward to help you remain healthy!  Lars Mage, MD Internal Medicine PGY1   Acute Bronchitis, Adult Acute bronchitis is when air tubes (bronchi) in the lungs suddenly get swollen. The condition can make it hard to breathe. It can also cause these symptoms:  A cough.  Coughing up clear, yellow, or green mucus.  Wheezing.  Chest congestion.  Shortness of breath.  A fever.  Body aches.  Chills.  A sore throat.  Follow these instructions at home: Medicines  Take over-the-counter and prescription medicines only as told by your doctor.  If you were prescribed an antibiotic medicine, take it as told by your doctor. Do not stop taking the antibiotic even if you start to feel better. General instructions  Rest.  Drink enough fluids to keep your pee (urine) clear or pale yellow.  Avoid smoking and secondhand smoke. If you smoke and you need help quitting, ask your doctor. Quitting will help your lungs heal faster.  Use an inhaler, cool mist vaporizer, or humidifier as told by your doctor.  Keep all follow-up visits as told by your doctor. This is important. How is this prevented? To lower your risk of getting this condition again:  Wash your hands often with soap and water. If you cannot use soap and water, use hand sanitizer.  Avoid contact with people who have cold symptoms.  Try not to touch your hands to your mouth, nose, or eyes.  Make sure to get the flu shot every  year.  Contact a doctor if:  Your symptoms do not get better in 2 weeks. Get help right away if:  You cough up blood.  You have chest pain.  You have very bad shortness of breath.  You become dehydrated.  You faint (pass out) or keep feeling like you are going to pass out.  You keep throwing up (vomiting).  You have a very bad headache.  Your fever or chills gets worse. This information is not intended to replace advice given to you by your health care provider. Make sure you discuss any questions you have with your health care provider. Document Released: 04/01/2008 Document Revised: 05/22/2016 Document Reviewed: 04/03/2016 Elsevier Interactive Patient Education  2017 [employer].

## 2017-09-26 NOTE — Progress Notes (Signed)
   CC: Acute bronchitis follow up   HPI:  Sherri Gill is a 66 y.o. with pmh of depression, gerd, diabetes mellitus type 2, and iron deficiency anemia who presents for follow up of her acute bronchitis.    Past Medical History:  Diagnosis Date  . Chronic anemia 08/29/2012   Need colonoscopy or report. Need iron panel.   . Depression   . Dyslipidemia 06/30/2007  . Essential hypertension, benign 06/30/2007  . GERD 06/30/2007  . Insomnia 06/11/2012  . Obesity, BMI 35-40 06/11/2012  . Peripheral neuropathy 12/28/2009  . Type 2 diabetes mellitus, uncontrolled, with neuropathy ([place]) 06/30/2007   Review of Systems:  Per hpi  Physical Exam:  Vitals:   09/26/17 1417  BP: (!) 147/67  Pulse: 94  Temp: 97.9 F (36.6 C)  TempSrc: Oral  SpO2: 100%  Weight: 201 lb 6.4 oz (91.4 kg)  Height: 5\' 3"  (1.6 m)   Physical Exam  Constitutional: She appears well-developed and well-nourished. No distress.  HENT:  Head: Normocephalic and atraumatic.  Mouth/Throat: Oropharynx is clear and moist. No oropharyngeal exudate.  Eyes: Conjunctivae are normal.  Cardiovascular: Normal rate and normal heart sounds.  Respiratory: Effort normal. No respiratory distress. She has wheezes (throughout lung fields).  GI: Soft. Bowel sounds are normal. She exhibits distension. There is no tenderness.  Musculoskeletal: Normal range of motion.  Neurological: She is alert.  Skin: She is not diaphoretic.  Psychiatric: She has a normal mood and affect. Her behavior is normal. Judgment and thought content normal.    Assessment & Plan:   See Encounters Tab for problem based charting.  Patient seen with Dr. Daryll Drown

## 2017-09-27 NOTE — Assessment & Plan Note (Signed)
The patient's hga1c was checked on 08/07/17 during which time   -Continue janumet 50-1000mg  bid -Check a1c in 2 months

## 2017-09-27 NOTE — Assessment & Plan Note (Signed)
The patient presented to the clinic with a 1 month history of nausea, sweating, chills, fever, wheezing, and productive cough with yellow sputum production. The patient has taken robitussin, tylenol, and ibuprofen which have not alleviated the pain. She states that her albuterol inhaler and tessalon pearls have helped alleviate her cough and bronchospasms. She denies any sneezing, sore throat, ear pain, or itching eyes. She denied any sob or chest pain. The patient has had 3 prior visits with similar symptoms on 11/1,11/5, 11/12, and 11/19. She had a chest x-ray done on 09/15/17 which did not show any consolidation indicative of pneumonia, but there was some mild interstitial prominence which suggested low grade pulmonary edema/acute bronchitis/pneumonitis. The patient was given doxycycline 100mg  bid for 5 days which she states provided some relief.   -continue albuterol inhaler -received a breathing treatment in clinic today 09/26/17 -prescribed hycodan syrup for the cough and instructed the patient to take it at nighttime. I asked the patient about her codeine allergy and she mentioned that it was several years ago and she wanted to try the syrup.

## 2017-09-27 NOTE — Assessment & Plan Note (Addendum)
The patient's blood pressure during this visit was 147/67 and subsequently 130/70. The patient's blood pressure is well controlled on current antihypertensive regimen.   -continue amlodipine 5mg  qd, losartan-hctz 100-25mg  qd

## 2017-09-27 NOTE — Assessment & Plan Note (Signed)
The patient states that she has been having some nasal bleeding for the past few months. The patient denied any nasal picking. I explained to the patient that the nasal bleeding maybe due to the air being dry, but she stated that she does not feel that is true. She mentioned that flonase has helped in the past.   -Prescribed flonase -Will monitor if the patient's symptoms improve. If not will consider workup for bleeding disorders and also consider ENT consult to look for nasal polyps.

## 2017-09-28 NOTE — Progress Notes (Signed)
Internal Medicine Clinic Attending  I saw and evaluated the patient.  I personally confirmed the key portions of the history and exam documented by Dr. [name] and I reviewed pertinent patient test results.  The assessment, diagnosis, and plan were formulated together and I agree with the documentation in the resident's note. 

## 2017-10-01 NOTE — Progress Notes (Signed)
Thank you for letting me know Dr. Dareen Piano. I will follow up about this.  Sherri Gill

## 2017-10-10 ENCOUNTER — Encounter: Payer: Self-pay | Admitting: Gastroenterology

## 2017-10-30 NOTE — Progress Notes (Deleted)
   CC: ***  HPI:  [name] is a 67 y.o.   Essential Hypertension  The patient's blood pressure during this visit was ***. The patient is currently being prescribed amlodipine 5mg  and hyzaar 100-25   Diabetes  The patient's last HgA1c=8.8 in Octoberr 2018.  The patient is currently taking janumet 50-1000mg  daily    Elevated ALP  Vitamin D level  The patient has been on 1000u vitamin d daily    Health Maintenance -Tetanus shot  -Colonoscopy  Past Medical History:  Diagnosis Date  . Chronic anemia 08/29/2012   Need colonoscopy or report. Need iron panel.   . Depression   . Dyslipidemia 06/30/2007  . Essential hypertension, benign 06/30/2007  . GERD 06/30/2007  . Insomnia 06/11/2012  . Obesity, BMI 35-40 06/11/2012  . Peripheral neuropathy 12/28/2009  . Type 2 diabetes mellitus, uncontrolled, with neuropathy ([place]) 06/30/2007   Review of Systems:  ***  Physical Exam:  There were no vitals filed for this visit. ***  Assessment & Plan:   See Encounters Tab for problem based charting.  Patient {GC/GE:[id number]::"discussed with","seen with"} Dr. {NAMES:[id number]::"Butcher","Granfortuna","E. Hoffman","Klima","Mullen","Narendra","Raines","Vincent"}

## 2017-10-31 ENCOUNTER — Encounter: Payer: BLUE CROSS/BLUE SHIELD | Admitting: Internal Medicine

## 2017-11-07 ENCOUNTER — Other Ambulatory Visit: Payer: Self-pay | Admitting: *Deleted

## 2017-11-07 DIAGNOSIS — I10 Essential (primary) hypertension: Secondary | ICD-10-CM

## 2017-11-07 MED ORDER — AMLODIPINE BESYLATE 5 MG PO TABS: 5.0000 mg | ORAL_TABLET | Freq: Every day | ORAL | 1 refills | Status: DC

## 2017-11-12 ENCOUNTER — Telehealth: Payer: Self-pay | Admitting: *Deleted

## 2017-11-12 NOTE — Telephone Encounter (Signed)
While prepping chart seen that the patient had a GI referral 09-26-2016 for IDA. Patient is for direct colonoscopy? Does she need office visit or okay for direct colon? Last colon was 2005. Should dx code be screening or IDA? Thank you,Marland Reine pv

## 2017-11-13 NOTE — Telephone Encounter (Signed)
That referral was canceled. Direct colon fine.

## 2017-11-13 NOTE — Telephone Encounter (Signed)
Per Dr. Loletha Carrow ok for a direct screening colonoscopy.    Riki Sheer, LPN  ( [facility] )

## 2017-12-04 ENCOUNTER — Encounter: Payer: BLUE CROSS/BLUE SHIELD | Admitting: Gastroenterology

## 2018-01-21 ENCOUNTER — Encounter: Payer: Self-pay | Admitting: Internal Medicine

## 2018-01-21 ENCOUNTER — Other Ambulatory Visit: Payer: Self-pay | Admitting: Internal Medicine

## 2018-01-21 DIAGNOSIS — E1165 Type 2 diabetes mellitus with hyperglycemia: Principal | ICD-10-CM

## 2018-01-21 DIAGNOSIS — E1140 Type 2 diabetes mellitus with diabetic neuropathy, unspecified: Secondary | ICD-10-CM

## 2018-01-21 DIAGNOSIS — IMO0002 Reserved for concepts with insufficient information to code with codable children: Secondary | ICD-10-CM

## 2018-01-21 NOTE — Progress Notes (Signed)
Lets go ahead and schedule a follow appointment for Sherri Gill in acc this week. She is supposed to be on janumet and I am not sure why there is a refill request for metformin. Thank you!

## 2018-01-21 NOTE — Telephone Encounter (Signed)
Tried to call pt to check on meds being taken, no answer, vmail has not been setup

## 2018-01-21 NOTE — Telephone Encounter (Signed)
Would you still like for me to refill the medication till the next appoint ment, or should I wait for her to be seen?   Sherri Gill

## 2018-01-22 ENCOUNTER — Encounter: Payer: Self-pay | Admitting: Internal Medicine

## 2018-01-22 NOTE — Progress Notes (Signed)
Attempted to call pt, no answer, no vmail 

## 2018-01-22 NOTE — Progress Notes (Signed)
In regards to: Gaylyn Rong, RN at 01/21/2018 5:06 PM   Status: Signed    Pt returned call, she seems confused about what she is taking and what she should be taking. She states she cannot come this week to [facility], appt made for 4/4 at [place], RN at 01/21/2018 5:06 PM   Status: Signed    Attempted to call pt, no answer, no vmail    Lars Mage, MD at 01/21/2018 5:06 PM   Status: Signed    Lets go ahead and schedule a follow appointment for Ms. Ohlin in acc this week. She is supposed to be on janumet and I am not sure why there is a refill request for metformin. Thank you!     Reply: Per my last encounter with patient in November 2018, I have documented that she should be taking janumet 50-1000mg  bid. I received a refill for just metformin and therefore wanted to confirm why that is the case. Please schedule patient for follow up in [facility] as she is due to be re-evaluated regarding her diabetes.

## 2018-01-22 NOTE — Progress Notes (Signed)
Pt returned call, she seems confused about what she is taking and what she should be taking. She states she cannot come this week to [facility], appt made for 4/4 at [place]

## 2018-01-23 ENCOUNTER — Other Ambulatory Visit: Payer: Self-pay

## 2018-01-23 ENCOUNTER — Telehealth: Payer: Self-pay | Admitting: *Deleted

## 2018-01-23 ENCOUNTER — Ambulatory Visit (INDEPENDENT_AMBULATORY_CARE_PROVIDER_SITE_OTHER): Payer: BLUE CROSS/BLUE SHIELD | Admitting: Internal Medicine

## 2018-01-23 DIAGNOSIS — J029 Acute pharyngitis, unspecified: Secondary | ICD-10-CM

## 2018-01-23 DIAGNOSIS — R0982 Postnasal drip: Secondary | ICD-10-CM

## 2018-01-23 DIAGNOSIS — Z79899 Other long term (current) drug therapy: Secondary | ICD-10-CM | POA: Diagnosis not present

## 2018-01-23 DIAGNOSIS — I10 Essential (primary) hypertension: Secondary | ICD-10-CM

## 2018-01-23 DIAGNOSIS — R51 Headache: Secondary | ICD-10-CM | POA: Diagnosis not present

## 2018-01-23 DIAGNOSIS — R0981 Nasal congestion: Secondary | ICD-10-CM

## 2018-01-23 DIAGNOSIS — J069 Acute upper respiratory infection, unspecified: Secondary | ICD-10-CM | POA: Insufficient documentation

## 2018-01-23 MED ORDER — AMLODIPINE BESYLATE 5 MG PO TABS: 5.0000 mg | ORAL_TABLET | Freq: Every day | ORAL | 1 refills | Status: DC

## 2018-01-23 MED ORDER — LOSARTAN POTASSIUM-HCTZ 100-25 MG PO TABS: 1.0000 | ORAL_TABLET | Freq: Every day | ORAL | 1 refills | Status: DC

## 2018-01-23 NOTE — Progress Notes (Signed)
Internal Medicine Clinic Attending  Case discussed with Dr. Wallace at the time of the visit.  We reviewed the resident's history and exam and pertinent patient test results.  I agree with the assessment, diagnosis, and plan of care documented in the resident's note.  

## 2018-01-23 NOTE — Assessment & Plan Note (Signed)
BP: (!) 144/64    BP today is 144/64.  She is on Losartan-HCTZ 100-25mg  daily and amlodipine 5mg  daily.  She said she was waiting on a refill request to be filled by her PCP but wanted me to do so today so she did not run out of her medications.  Plan: - Refill provided for medications as listed above - Follow up with PCP.

## 2018-01-23 NOTE — Progress Notes (Signed)
   CC: congested and sore throat  HPI:  Sherri Gill is a 67 y.o. woman with a past medical history listed below here today for sore throat and congestion.  For details of today's visit and the status of her chronic medical issues please refer to the assessment and plan.   Past Medical History:  Diagnosis Date  . Chronic anemia 08/29/2012   Need colonoscopy or report. Need iron panel.   . Depression   . Dyslipidemia 06/30/2007  . Essential hypertension, benign 06/30/2007  . GERD 06/30/2007  . Insomnia 06/11/2012  . Obesity, BMI 35-40 06/11/2012  . Peripheral neuropathy 12/28/2009  . Type 2 diabetes mellitus, uncontrolled, with neuropathy ([place]) 06/30/2007   Review of Systems:  Please see pertinent ROS reviewed in HPI and problem based charting.   Physical Exam:  Vitals:   01/23/18 1526  BP: (!) 144/64  Pulse: 83  Temp: 98.3 F (36.8 C)  TempSrc: Oral  SpO2: 97%  Height: 5\' 3"  (1.6 m)   Physical Exam  Constitutional: She is oriented to person, place, and time and well-developed, well-nourished, and in no distress.  HENT:  Head: Normocephalic and atraumatic.  Mouth/Throat: No oropharyngeal exudate.  Mild erythema of throat  Cardiovascular: Normal rate and regular rhythm.  Pulmonary/Chest: Effort normal and breath sounds normal. She has no wheezes. She has no rales.  Neurological: She is alert and oriented to person, place, and time.  Skin: Skin is warm and dry.  Psychiatric: Mood and affect normal.     Assessment & Plan:   See Encounters Tab for problem based charting.  Patient discussed with Dr. Lynnae January.  Viral URI Patient presents with symptoms consistent with viral URI vs allergies today.  She has been feeling unwell since Tuesday when she was exposed to a friend with similar symptoms.  She has been feeling congested, having post nasal drip, scratchy throat, and headache.  She has not had myalgias, nausea, vomiting, or diarrhea.  Her energy level has been low. She  has been using Flonase and Claritin for symptoms as well as Alka Seltzer Plus.  She asked for an antibiotic today for her symptoms.    Plan: - Advised supportive care for her viral illness or allergy symptoms - COntinue flonase and claritin - Tylenol and Ibuprofen as needed - Frequent saline nasal irrigation - Benadryl as needed for congestion if symptoms persist - Work note given for today.  Suspect she should be well enough over the weekend to return to work  - Advised antibiotics were not indicated for her symptoms at present - FOllow up with PCP  Essential hypertension BP: (!) 144/64    BP today is 144/64.  She is on Losartan-HCTZ 100-25mg  daily and amlodipine 5mg  daily.  She said she was waiting on a refill request to be filled by her PCP but wanted me to do so today so she did not run out of her medications.  Plan: - Refill provided for medications as listed above - Follow up with PCP.

## 2018-01-23 NOTE — Assessment & Plan Note (Signed)
Patient presents with symptoms consistent with viral URI vs allergies today.  She has been feeling unwell since Tuesday when she was exposed to a friend with similar symptoms.  She has been feeling congested, having post nasal drip, scratchy throat, and headache.  She has not had myalgias, nausea, vomiting, or diarrhea.  Her energy level has been low. She has been using Flonase and Claritin for symptoms as well as Alka Seltzer Plus.  She asked for an antibiotic today for her symptoms.    Plan: - Advised supportive care for her viral illness or allergy symptoms - COntinue flonase and claritin - Tylenol and Ibuprofen as needed - Frequent saline nasal irrigation - Benadryl as needed for congestion if symptoms persist - Work note given for today.  Suspect she should be well enough over the weekend to return to work  - Advised antibiotics were not indicated for her symptoms at present - FOllow up with PCP

## 2018-01-23 NOTE — Patient Instructions (Addendum)
FOLLOW-UP INSTRUCTIONS When: with Dr Maricela Bo when you are able to see her next For: Blood pressure and DM What to bring: your medications  You have a cold right now that should improve.  I'd like for you to do the following over the weekend to treat your symptoms: 1. Continue using Flonase and Claritin 2. Use saline nasal irrigation as much as you want to help your symptoms 3.  Take tylenol and ibuprofen as needed for pain 4. You can use benadryl as needed to help with sleep and congestion.

## 2018-01-23 NOTE — Telephone Encounter (Signed)
Pt came to triage after appt, c/o many things, stating dr Juleen China is racist and dr Juleen China is racist. Stating doctors are rich and dont care about poor people especially poor black people. Triage let her vent and then ask what she wanted to happen today. She c/o blood pressure med not being at pharm, [facility] called [facility] and on speaker ph- her ph- they stated she can pick it up now. Then she started about metformin and janumet, dicussed reason for 4/4 visit. She is calmed and reassured  And finally states she is going to start janumet tomorrow and stop metformin. She states she will call back mon and decide if she will come in 4/4 or wait til June to see dr Maricela Bo.

## 2018-01-24 NOTE — Telephone Encounter (Signed)
I am sorry to hear about [name]' experience. Please let me know if there is anything else I can do to help. Thanks!  Tonee Silverstein

## 2018-01-29 ENCOUNTER — Ambulatory Visit: Payer: BLUE CROSS/BLUE SHIELD

## 2018-01-29 ENCOUNTER — Encounter: Payer: Self-pay | Admitting: Internal Medicine

## 2018-03-06 NOTE — Telephone Encounter (Signed)
closed

## 2018-03-12 ENCOUNTER — Other Ambulatory Visit: Payer: Self-pay | Admitting: Internal Medicine

## 2018-03-12 DIAGNOSIS — E1165 Type 2 diabetes mellitus with hyperglycemia: Principal | ICD-10-CM

## 2018-03-12 DIAGNOSIS — E1140 Type 2 diabetes mellitus with diabetic neuropathy, unspecified: Secondary | ICD-10-CM

## 2018-03-12 DIAGNOSIS — IMO0002 Reserved for concepts with insufficient information to code with codable children: Secondary | ICD-10-CM

## 2018-03-16 ENCOUNTER — Other Ambulatory Visit: Payer: Self-pay

## 2018-03-16 ENCOUNTER — Ambulatory Visit (INDEPENDENT_AMBULATORY_CARE_PROVIDER_SITE_OTHER): Payer: BLUE CROSS/BLUE SHIELD | Admitting: Internal Medicine

## 2018-03-16 ENCOUNTER — Encounter: Payer: Self-pay | Admitting: Internal Medicine

## 2018-03-16 VITALS — BP 143/58 | HR 77 | Temp 98.1°F | Ht 63.0 in | Wt 206.5 lb

## 2018-03-16 DIAGNOSIS — IMO0002 Reserved for concepts with insufficient information to code with codable children: Secondary | ICD-10-CM

## 2018-03-16 DIAGNOSIS — I10 Essential (primary) hypertension: Secondary | ICD-10-CM

## 2018-03-16 DIAGNOSIS — Z87891 Personal history of nicotine dependence: Secondary | ICD-10-CM | POA: Diagnosis not present

## 2018-03-16 DIAGNOSIS — Z7984 Long term (current) use of oral hypoglycemic drugs: Secondary | ICD-10-CM

## 2018-03-16 DIAGNOSIS — D508 Other iron deficiency anemias: Secondary | ICD-10-CM | POA: Diagnosis not present

## 2018-03-16 DIAGNOSIS — E1140 Type 2 diabetes mellitus with diabetic neuropathy, unspecified: Secondary | ICD-10-CM

## 2018-03-16 DIAGNOSIS — Z86010 Personal history of colonic polyps: Secondary | ICD-10-CM | POA: Diagnosis not present

## 2018-03-16 DIAGNOSIS — Z79899 Other long term (current) drug therapy: Secondary | ICD-10-CM

## 2018-03-16 DIAGNOSIS — E1165 Type 2 diabetes mellitus with hyperglycemia: Secondary | ICD-10-CM

## 2018-03-16 DIAGNOSIS — D509 Iron deficiency anemia, unspecified: Secondary | ICD-10-CM

## 2018-03-16 DIAGNOSIS — R252 Cramp and spasm: Secondary | ICD-10-CM

## 2018-03-16 LAB — POCT GLYCOSYLATED HEMOGLOBIN (HGB A1C): Hemoglobin A1C: 7.6

## 2018-03-16 LAB — GLUCOSE, CAPILLARY: Glucose-Capillary: 145 mg/dL — ABNORMAL HIGH (ref 65–99)

## 2018-03-16 MED ORDER — AMLODIPINE BESYLATE 5 MG PO TABS: 5.0000 mg | ORAL_TABLET | Freq: Every day | ORAL | 1 refills | Status: DC

## 2018-03-16 NOTE — Assessment & Plan Note (Addendum)
Pt not taking iron supplemenation. Uncertain from chart review as to cause had a single benign polyp removed last colonoscopy in 1994.  Reports no known source of bleeding. She states she thinks she was told to stop then backpedaled and said she was told to take supplements 3x per week.  Not currently symptomatic other than some occasional hand cramping she is worried about low potassium.    -Will check iron panel and CBC/bmp today -referral placed for colonoscopy.   -will likely have pt resume iron

## 2018-03-16 NOTE — Patient Instructions (Signed)
Ms. Italiano, we will check your labs today as discussed.  Please continue your medications as prescribed.  We've referred you for a colonoscopy.  We will call you with the results of your iron and potassium levels.  You will likely need to resume iron supplementation.  No changes to your bp meds or diabetes meds today we will follow up at your next visit.

## 2018-03-16 NOTE — Progress Notes (Signed)
CC: T2DM follow up  HPI:  [name] is a 67 y.o. female with PMH below, she is here to follow up on T2DM.    Please see A&P for status of the patient's chronic medical conditions  Past Medical History:  Diagnosis Date  . Chronic anemia 08/29/2012   Need colonoscopy or report. Need iron panel.   . Depression   . Dyslipidemia 06/30/2007  . Essential hypertension, benign 06/30/2007  . GERD 06/30/2007  . Insomnia 06/11/2012  . Obesity, BMI 35-40 06/11/2012  . Peripheral neuropathy 12/28/2009  . Type 2 diabetes mellitus, uncontrolled, with neuropathy ([place]) 06/30/2007   Review of Systems:   ROS: Pulmonary: pt denies increased work of breathing, shortness of breath,  Cardiac: pt denies palpitations, chest pain,  Abdominal: pt denies abdominal pain, nausea, vomiting, or diarrhea Physical Exam:  Vitals:   03/16/18 0928  BP: (!) 143/58  Pulse: 77  Temp: 98.1 F (36.7 C)  TempSrc: Oral  SpO2: 97%  Weight: 206 lb 8 oz (93.7 kg)  Height: 5\' 3"  (1.6 m)   Physical Exam  Constitutional: No distress.  Eyes: Right eye exhibits no discharge. Left eye exhibits no discharge. No scleral icterus.  Cardiovascular: Normal rate, regular rhythm and normal heart sounds. Exam reveals no gallop and no friction rub.  No murmur heard. Pulmonary/Chest: Effort normal and breath sounds normal. No respiratory distress. She has no wheezes. She has no rales. She exhibits no tenderness.  Abdominal: Soft. Bowel sounds are normal. She exhibits no distension and no mass. There is no tenderness. There is no rebound and no guarding.  Neurological: She is alert.  Skin: She is not diaphoretic.    Social History   Socioeconomic History  . Marital status: Single    Spouse name: Not on file  . Number of children: Not on file  . Years of education: 101  . Highest education level: Not on file  Occupational History  . Occupation: Scientist, clinical (histocompatibility and immunogenetics): [employer]  . Financial resource strain: Not on  file  . Food insecurity:    Worry: Not on file    Inability: Not on file  . Transportation needs:    Medical: Not on file    Non-medical: Not on file  Tobacco Use  . Smoking status: Former Smoker    Last attempt to quit: 10/28/1978    Years since quitting: 39.4  . Smokeless tobacco: Never Used  Substance and Sexual Activity  . Alcohol use: No    Alcohol/week: 0.0 oz  . Drug use: No  . Sexual activity: Yes    Birth control/protection: Condom  Lifestyle  . Physical activity:    Days per week: Not on file    Minutes per session: Not on file  . Stress: Not on file  Relationships  . Social connections:    Talks on phone: Not on file    Gets together: Not on file    Attends religious service: Not on file    Active member of club or organization: Not on file    Attends meetings of clubs or organizations: Not on file    Relationship status: Not on file  . Intimate partner violence:    Fear of current or ex partner: Not on file    Emotionally abused: Not on file    Physically abused: Not on file    Forced sexual activity: Not on file  Other Topics Concern  . Not on file  Social History  Narrative   She is still care taking her elderly mother who has dementia and works part time at [employer].  Currently no health insurance.            Financial assistance application completed. Patient does not qualify for assistance - over income.   Per Bonna Gains 03/29/2010    Family History  Problem Relation Age of Onset  . Dementia Mother     Assessment & Plan:   See Encounters Tab for problem based charting.  Patient discussed with Dr. Dareen Piano

## 2018-03-16 NOTE — Assessment & Plan Note (Signed)
Lab Results  Component Value Date   HGBA1C 7.6 03/16/2018    A1C improved on Janumet.  Pt making dietary changes and losing weight.  Pt did not bring a log of her sugars today but will bring at next visit.  If continuing to improve may not require further meds if not can consider invokana 100mg  daily.

## 2018-03-16 NOTE — Assessment & Plan Note (Signed)
BP Readings from Last 3 Encounters:  03/16/18 (!) 143/58  01/23/18 (!) 144/64  09/26/17 130/70   BP close to goal.  Pt losing weight and making dietary changes.  Will reevaluate at follow up visit.  At that time if still elevated can increase amlodipine.

## 2018-03-17 LAB — BMP8+ANION GAP
Anion Gap: 17 mmol/L (ref 10.0–18.0)
BUN / CREAT RATIO: 15 (ref 12–28)
BUN: 13 mg/dL (ref 8–27)
CO2: 27 mmol/L (ref 20–29)
CREATININE: 0.87 mg/dL (ref 0.57–1.00)
Calcium: 9.8 mg/dL (ref 8.7–10.3)
Chloride: 93 mmol/L — ABNORMAL LOW (ref 96–106)
GFR, EST AFRICAN AMERICAN: 81 mL/min/{1.73_m2} (ref >59)
GFR, EST NON AFRICAN AMERICAN: 71 mL/min/{1.73_m2} (ref >59)
Glucose: 143 mg/dL — ABNORMAL HIGH (ref 65–99)
POTASSIUM: 4.2 mmol/L (ref 3.5–5.2)
SODIUM: 137 mmol/L (ref 134–144)

## 2018-03-17 LAB — CBC
HEMATOCRIT: 32.3 % — AB (ref 34.0–46.6)
Hemoglobin: 10.4 g/dL — ABNORMAL LOW (ref 11.1–15.9)
MCH: 25.1 pg — ABNORMAL LOW (ref 26.6–33.0)
MCHC: 32.2 g/dL (ref 31.5–35.7)
MCV: 78 fL — AB (ref 79–97)
PLATELETS: 404 10*3/uL (ref 150–450)
RBC: 4.14 x10E6/uL (ref 3.77–5.28)
RDW: 15.8 % — AB (ref 12.3–15.4)
WBC: 9.4 10*3/uL (ref 3.4–10.8)

## 2018-03-17 LAB — IRON AND TIBC
Iron Saturation: 9 % — CL (ref 15–55)
Iron: 34 ug/dL (ref 27–139)
Total Iron Binding Capacity: 382 ug/dL (ref 250–450)
UIBC: 348 ug/dL (ref 118–369)

## 2018-03-17 LAB — FERRITIN: FERRITIN: 43 ng/mL (ref 15–150)

## 2018-03-17 NOTE — Progress Notes (Signed)
Internal Medicine Clinic Attending  Case discussed with Dr. Winfrey  at the time of the visit.  We reviewed the resident's history and exam and pertinent patient test results.  I agree with the assessment, diagnosis, and plan of care documented in the resident's note.  

## 2018-03-25 ENCOUNTER — Encounter: Payer: Self-pay | Admitting: *Deleted

## 2018-03-26 ENCOUNTER — Telehealth: Payer: Self-pay | Admitting: Internal Medicine

## 2018-03-26 DIAGNOSIS — D508 Other iron deficiency anemias: Secondary | ICD-10-CM

## 2018-03-26 MED ORDER — FERROUS GLUCONATE 324 (38 FE) MG PO TABS: 324.0000 mg | ORAL_TABLET | Freq: Every day | ORAL | 0 refills | Status: DC

## 2018-03-26 NOTE — Addendum Note (Signed)
Addended by: Hulan Fray on: 03/26/2018 06:25 PM   Modules accepted: Orders

## 2018-03-26 NOTE — Telephone Encounter (Signed)
spoke with patient about iron deficiency, sending prescription for iron to pharmacy.  Discussed pts normal renal function and potassium. Told pt to make a follow up appointment in about 2 months for a recheck.  Pt has not been contacted about colonoscopy, will place referral again.

## 2018-04-23 NOTE — Addendum Note (Signed)
Addended by: Hulan Fray on: 04/23/2018 03:20 PM   Modules accepted: Orders

## 2018-06-11 ENCOUNTER — Telehealth: Payer: Self-pay | Admitting: Internal Medicine

## 2018-06-11 NOTE — Telephone Encounter (Signed)
Pt can not afford $284.92for her losartan-hydrochlorothiazide (HYZAAR) 100-25 MG tablet medication and is not on the $4.00 list.  Patient is asking for a different RX. Pt is also needing her venlafaxine XR (EFFEXOR-XR) 37.5 MG 24 hr capsule refilled as well   Pt is using the [facility] in [place] .  Patient is there because her brother has Cancer and she is taking care of him for a couple of months.

## 2018-06-16 NOTE — Telephone Encounter (Signed)
venlafaxine XR (EFFEXOR-XR) 37.5 MG 24 hr capsule  losartan-hydrochlorothiazide (HYZAAR) 100-25 MG tablet, refill request. Requesting to speak with a nurse. Please call back.

## 2018-06-17 ENCOUNTER — Other Ambulatory Visit: Payer: Self-pay | Admitting: Internal Medicine

## 2018-06-17 MED ORDER — LOSARTAN POTASSIUM-HCTZ 50-12.5 MG PO TABS: 2.0000 | ORAL_TABLET | Freq: Every day | ORAL | 0 refills | Status: DC

## 2018-06-17 MED ORDER — VENLAFAXINE HCL ER 37.5 MG PO CP24: 37.5000 mg | ORAL_CAPSULE | Freq: Every day | ORAL | 3 refills | Status: DC

## 2018-06-17 NOTE — Telephone Encounter (Signed)
I have refilled the patient's prescription and sent it to [facility] in [place] [facility] [place].  Please tell her to pick up the prescriptions and let me know if they are still not affordable thank you.

## 2018-06-18 ENCOUNTER — Other Ambulatory Visit: Payer: Self-pay | Admitting: Internal Medicine

## 2018-06-18 DIAGNOSIS — E1165 Type 2 diabetes mellitus with hyperglycemia: Principal | ICD-10-CM

## 2018-06-18 DIAGNOSIS — E1140 Type 2 diabetes mellitus with diabetic neuropathy, unspecified: Secondary | ICD-10-CM

## 2018-06-18 DIAGNOSIS — IMO0002 Reserved for concepts with insufficient information to code with codable children: Secondary | ICD-10-CM

## 2018-06-18 MED ORDER — HYDROCHLOROTHIAZIDE 25 MG PO TABS: 25.0000 mg | ORAL_TABLET | Freq: Every day | ORAL | 1 refills | Status: DC

## 2018-06-18 MED ORDER — SITAGLIPTIN PHOS-METFORMIN HCL 50-1000 MG PO TABS: 1.0000 | ORAL_TABLET | Freq: Two times a day (BID) | ORAL | 1 refills | Status: DC

## 2018-06-18 MED ORDER — LOSARTAN POTASSIUM 100 MG PO TABS: 100.0000 mg | ORAL_TABLET | Freq: Every day | ORAL | 1 refills | Status: DC

## 2018-06-18 MED ORDER — VENLAFAXINE HCL ER 37.5 MG PO CP24: 37.5000 mg | ORAL_CAPSULE | Freq: Every day | ORAL | 1 refills | Status: DC

## 2018-06-18 NOTE — Telephone Encounter (Signed)
I have already sent in the losartan and hctz scripts separately. Thanks!

## 2018-06-18 NOTE — Telephone Encounter (Signed)
Need refills on venlafaxine XR (EFFEXOR-XR) 37.5 MG 24 hr capsule losartan-hydrochlorothiazide (HYZAAR) 50-12.5 MG tablet sitaGLIPtin-metformin (JANUMET) 50-1000 MG tablet(Expired) @ [facility] [address]  Pt is out of town taking care of brother, pt didn't grab all her medicine. Pt said she have been calling everyday for 4 days no one has called her back. Pt contact#  [phone]

## 2018-06-18 NOTE — Telephone Encounter (Signed)
Dr Maricela Bo she does not want combo pill Losartan-HCTZ; I do no see separate medications on med list.

## 2018-06-18 NOTE — Telephone Encounter (Signed)
Pt states she's in [place] taking care of her brother who has cancer and she grabbed the wrong  Medication bag. She needs rxs for Effexor, Janumet , Losartan 100 mg and HCTZ sent to [facility]. She had called the pharmacy who told her to have her doctor divide losartan/hctz into 2 separate meds which each one will be on the $4 list otherwise it will cost $140 which she does not have. thanks

## 2018-06-18 NOTE — Telephone Encounter (Signed)
The combination losartan-hydrochlorothiazide pill that I had prescribed to [facility] is actually on the $4 list.  However, due to the patient's preference I had split the pill and resent it now.  Thank you!

## 2018-06-18 NOTE — Addendum Note (Signed)
Addended by: Lars Mage on: 06/18/2018 05:10 PM   Modules accepted: Orders

## 2018-06-19 ENCOUNTER — Telehealth: Payer: Self-pay | Admitting: *Deleted

## 2018-06-19 ENCOUNTER — Other Ambulatory Visit: Payer: Self-pay | Admitting: Internal Medicine

## 2018-06-19 MED ORDER — METFORMIN HCL 1000 MG PO TABS: 1000.0000 mg | ORAL_TABLET | Freq: Two times a day (BID) | ORAL | 11 refills | Status: DC

## 2018-06-19 MED ORDER — METFORMIN HCL 1000 MG PO TABS: 1000.0000 mg | ORAL_TABLET | Freq: Two times a day (BID) | ORAL | 0 refills | Status: DC

## 2018-06-19 NOTE — Progress Notes (Signed)
Spoke to patient and she said that she will need a one-week supply of affordable diabetes medications until she reaches [place].  Therefore prescribed her metformin 1000 mg twice daily until she gets back to [place] on September 2.  Lars Mage, MD Internal Medicine PGY2 [phone] 06/19/2018, 11:50 AM

## 2018-06-19 NOTE — Telephone Encounter (Signed)
Dr Maricela Bo stated she had called the Sherri Gill.

## 2018-06-19 NOTE — Telephone Encounter (Signed)
Call from pt stating she cannot afford Janumet w/o insurance; cost >$400 ;even for 10 tabs will cost >$100.00. She wants to be switch back to Metformin until she returns to [facility] on Sept 2. Please send rx to [facility]. Thanks

## 2018-06-19 NOTE — Addendum Note (Signed)
Addended by: Lars Mage on: 06/19/2018 11:46 AM   Modules accepted: Orders

## 2018-11-19 NOTE — Progress Notes (Signed)
   CC: Diabetes Follow up  HPI:  Sherri Gill is a 68 y.o. with essential htn, diabetes mellitus 2, depression with history of difficulty affording medication who presents for diabetes follow up. Please see problem based charting for evaluation, assessment, and plan.  Past Medical History:  Diagnosis Date  . Chronic anemia 08/29/2012   Need colonoscopy or report. Need iron panel.   . Depression   . Dyslipidemia 06/30/2007  . Essential hypertension, benign 06/30/2007  . GERD 06/30/2007  . Insomnia 06/11/2012  . Obesity, BMI 35-40 06/11/2012  . Peripheral neuropathy 12/28/2009  . Type 2 diabetes mellitus, uncontrolled, with neuropathy ([place]) 06/30/2007   Review of Systems:    Review of Systems  Constitutional: Positive for malaise/fatigue. Negative for chills.  Cardiovascular: Negative for chest pain.  Gastrointestinal: Negative for nausea and vomiting.  Neurological: Negative for dizziness.   Physical Exam:  Vitals:   11/20/18 1544  BP: 134/63  Pulse: 90  Temp: 98.3 F (36.8 C)  TempSrc: Oral  SpO2: 98%  Weight: 202 lb 9.6 oz (91.9 kg)  Height: 5\' 3"  (1.6 m)   Physical Exam  Constitutional: Appears well-developed and well-nourished. No distress.  HENT:  Head: Normocephalic and atraumatic.  Eyes: Conjunctivae are injected Ear: bilateral ears with cerumen impaction, no tenderness to palpation at the tragus or posterior ear. Cardiovascular: Normal rate, regular rhythm and normal heart sounds.  Respiratory: Effort normal and breath sounds normal. No respiratory distress. No wheezes.  GI: Soft. Bowel sounds are normal. No distension. There is no tenderness.  Musculoskeletal: No edema.  Neurological: Is alert.  Skin: Not diaphoretic. No erythema.  Psychiatric: Depressed mood and affect. Behavior is normal. Judgment and thought content normal.    Assessment & Plan:   See Encounters Tab for problem based charting.  Patient discussed with Dr. Evette Doffing

## 2018-11-20 ENCOUNTER — Ambulatory Visit (INDEPENDENT_AMBULATORY_CARE_PROVIDER_SITE_OTHER): Payer: Self-pay | Admitting: Internal Medicine

## 2018-11-20 ENCOUNTER — Encounter: Payer: Self-pay | Admitting: Internal Medicine

## 2018-11-20 ENCOUNTER — Other Ambulatory Visit: Payer: Self-pay

## 2018-11-20 VITALS — BP 134/63 | HR 90 | Temp 98.3°F | Ht 63.0 in | Wt 202.6 lb

## 2018-11-20 DIAGNOSIS — E1165 Type 2 diabetes mellitus with hyperglycemia: Secondary | ICD-10-CM

## 2018-11-20 DIAGNOSIS — E118 Type 2 diabetes mellitus with unspecified complications: Secondary | ICD-10-CM

## 2018-11-20 DIAGNOSIS — Z0000 Encounter for general adult medical examination without abnormal findings: Secondary | ICD-10-CM

## 2018-11-20 DIAGNOSIS — IMO0002 Reserved for concepts with insufficient information to code with codable children: Secondary | ICD-10-CM

## 2018-11-20 DIAGNOSIS — Z79899 Other long term (current) drug therapy: Secondary | ICD-10-CM

## 2018-11-20 DIAGNOSIS — F339 Major depressive disorder, recurrent, unspecified: Secondary | ICD-10-CM

## 2018-11-20 DIAGNOSIS — E1140 Type 2 diabetes mellitus with diabetic neuropathy, unspecified: Secondary | ICD-10-CM

## 2018-11-20 DIAGNOSIS — D509 Iron deficiency anemia, unspecified: Secondary | ICD-10-CM

## 2018-11-20 DIAGNOSIS — H6123 Impacted cerumen, bilateral: Secondary | ICD-10-CM

## 2018-11-20 DIAGNOSIS — F332 Major depressive disorder, recurrent severe without psychotic features: Secondary | ICD-10-CM

## 2018-11-20 DIAGNOSIS — H9202 Otalgia, left ear: Secondary | ICD-10-CM | POA: Insufficient documentation

## 2018-11-20 DIAGNOSIS — I10 Essential (primary) hypertension: Secondary | ICD-10-CM

## 2018-11-20 DIAGNOSIS — Z7984 Long term (current) use of oral hypoglycemic drugs: Secondary | ICD-10-CM

## 2018-11-20 LAB — GLUCOSE, CAPILLARY: GLUCOSE-CAPILLARY: 180 mg/dL — AB (ref 70–99)

## 2018-11-20 LAB — POCT GLYCOSYLATED HEMOGLOBIN (HGB A1C): HEMOGLOBIN A1C: 10.6 % — AB (ref 4.0–5.6)

## 2018-11-20 MED ORDER — GLIPIZIDE ER 10 MG PO TB24: 10.0000 mg | ORAL_TABLET | Freq: Every day | ORAL | 1 refills | Status: DC

## 2018-11-20 MED ORDER — METFORMIN HCL ER (MOD) 1000 MG PO TB24: 1000.0000 mg | ORAL_TABLET | Freq: Two times a day (BID) | ORAL | 1 refills | Status: DC

## 2018-11-20 NOTE — Assessment & Plan Note (Signed)
The patient's last a1c=7.6 on May 2019. The patient is currently taking sitagliptin-metformin 50-1000mg . She has lost 5lbs over the past few months. The patient reports  travelling out of town and forgetting to take her medications a lot over the past few months. The patient called me a few months ago when she was out of town stating that she forgot her janumet at home and she could not afford to buy another supply of janumet while she was out of town. Thus, I prescribed the patient metformin 1000mg  bid to use temporarily.    Assessment and plan  The patient's diabetes is not controlled. Her a1c during this visit is 10.6. He increase in her blood glucose levels are likely due to her inconsistent use of antihyperglycemic agents. I recommeded that the patient start back a new regimen of metformin 1000mg  bid and glipizide 10mg  qd.

## 2018-11-20 NOTE — Assessment & Plan Note (Signed)
The patient's blood pressure during this visit was 134/63. The patient is currently taking amlodipine 5mg  qd, hctz 25mg  qd, losartan 100mg  mg qd. His last blood pressure visits are   BP Readings from Last 3 Encounters:  11/20/18 134/63  03/16/18 (!) 143/58  01/23/18 (!) 144/64  Assessment and Plan Patient's blood pressure is well controlled she should continue current regimen.

## 2018-11-20 NOTE — Patient Instructions (Signed)
It was a pleasure to see you today Ms. Marquis. Please make the following changes:  -Please stop taking janumet and start taking glipizide 10mg  daily and metformin 1000mg  twice daily  -I have made you a referral to a behavioral health specialist who will help with your mood and sleep -Follow up in 3 months  If you have any questions or concerns, please call our clinic at [phone] between 9am-5pm and after hours call [phone] and ask for the internal medicine resident on call. If you feel you are having a medical emergency please call 911.   Thank you, we look forward to help you remain healthy!  Lars Mage, MD Internal Medicine PGY2

## 2018-11-20 NOTE — Assessment & Plan Note (Signed)
The patient states that she has been having depressive symptoms, decreased appetite, lack of sleep, and been very tearful lately. Her phq9 is 9. She has bene using venlafaxine 37.5mg  qd.   Assessment and plan  Recommended the patient continue venlafaxine and also referred the patient to behavioral health services for counseling.

## 2018-11-20 NOTE — Assessment & Plan Note (Signed)
The patient states that she has been having left sided ear pain over the past week. She states that one of her friends has been sick with similar complaints over the past week. She does not have any hearing loss or discharge coming out of the ear.   Assessment and plan  The patient does not have any tenderness to palpation of external ear or posterior ear. No lymphadenopathy noted. Patient had significant cerumen impaction that could not be evacuated with manipulation. Therefore, recommended the patient use debrox drops over the weekend and return to clinic 1/27 for her left ear to be examined.

## 2018-11-23 NOTE — Progress Notes (Signed)
Internal Medicine Clinic Attending  Case discussed with Dr. [name] at the time of the visit.  We reviewed the resident's history and exam and pertinent patient test results.  I agree with the assessment, diagnosis, and plan of care documented in the resident's note. 

## 2018-11-25 ENCOUNTER — Other Ambulatory Visit: Payer: Self-pay | Admitting: *Deleted

## 2018-11-25 DIAGNOSIS — E1140 Type 2 diabetes mellitus with diabetic neuropathy, unspecified: Secondary | ICD-10-CM

## 2018-11-25 DIAGNOSIS — IMO0002 Reserved for concepts with insufficient information to code with codable children: Secondary | ICD-10-CM

## 2018-11-25 DIAGNOSIS — E1165 Type 2 diabetes mellitus with hyperglycemia: Principal | ICD-10-CM

## 2018-11-25 MED ORDER — METFORMIN HCL 1000 MG PO TABS: 1000.0000 mg | ORAL_TABLET | Freq: Two times a day (BID) | ORAL | 3 refills | Status: DC

## 2018-11-25 NOTE — Telephone Encounter (Signed)
Received fax from pt's pharmacy-metformin (glumetza)1000 mg (MOD) 24hr tab prescription will cost $8000.  Please change to to 500 mg 24hr tabs if appropriate.Despina Hidden Cassady1/29/20204:33 PM

## 2018-12-04 ENCOUNTER — Other Ambulatory Visit: Payer: Self-pay | Admitting: Internal Medicine

## 2018-12-04 DIAGNOSIS — E1140 Type 2 diabetes mellitus with diabetic neuropathy, unspecified: Secondary | ICD-10-CM

## 2018-12-04 DIAGNOSIS — E1165 Type 2 diabetes mellitus with hyperglycemia: Principal | ICD-10-CM

## 2018-12-04 DIAGNOSIS — IMO0002 Reserved for concepts with insufficient information to code with codable children: Secondary | ICD-10-CM

## 2019-01-09 ENCOUNTER — Other Ambulatory Visit: Payer: Self-pay | Admitting: Internal Medicine

## 2019-01-12 ENCOUNTER — Other Ambulatory Visit: Payer: Self-pay | Admitting: Internal Medicine

## 2019-01-12 NOTE — Telephone Encounter (Signed)
hydrochlorothiazide (HYDRODIURIL) 25 MG tablet, REFILL REQUEST @  [place], [place] [address] [phone] (Phone) [phone] (Fax)

## 2019-01-12 NOTE — Telephone Encounter (Signed)
Rx approved by pcp on 01/12/19 but failed to transmit -rx phoned into pharmacy .Despina Hidden Cassady3/17/20203:55 PM

## 2019-01-27 ENCOUNTER — Other Ambulatory Visit: Payer: Self-pay | Admitting: Internal Medicine

## 2019-01-27 DIAGNOSIS — D508 Other iron deficiency anemias: Secondary | ICD-10-CM

## 2019-03-08 ENCOUNTER — Telehealth: Payer: Self-pay | Admitting: Internal Medicine

## 2019-03-08 NOTE — Telephone Encounter (Signed)
Patient is having abdominal pain and diarrhea x1 wk.  Please call patient back wanting advise on what medicines she can take.

## 2019-03-08 NOTE — Telephone Encounter (Signed)
Please advise 

## 2019-03-09 NOTE — Telephone Encounter (Signed)
Please schedule for acc appointment

## 2019-03-09 NOTE — Telephone Encounter (Signed)
Called pt - no answer; vm has not been set up, unable to leave a message.

## 2019-03-10 ENCOUNTER — Other Ambulatory Visit: Payer: Self-pay | Admitting: Internal Medicine

## 2019-03-10 NOTE — Telephone Encounter (Signed)
Please schedule a future apppt

## 2019-03-16 NOTE — Telephone Encounter (Signed)
Called, vmail not available

## 2019-03-23 ENCOUNTER — Encounter: Payer: Self-pay | Admitting: *Deleted

## 2019-03-25 ENCOUNTER — Telehealth: Payer: Self-pay | Admitting: Dietician

## 2019-03-25 NOTE — Telephone Encounter (Signed)
Thank you :)

## 2019-03-25 NOTE — Telephone Encounter (Signed)
Called Sherri Gill to see if she wanted to have retina images on the same day as her upcoming appointment withy Dr. Maricela Bo.   She also reported that when glipizide was added to her diabetes regemin, she got diarrhea a lot (every hour for a day or two) and had a decreased appetite. Her stools are still loose but better and she does not feel she needs to be seen sooner than 04/13/19. She forgot to take glipizide yesterday and when she checked at 4 Pm, her blood sugar was 396, called pharmacy- they told her she could take her glipizide. (she'd eaten  corn x2, ~ noon). We discussed possible meal options to help with control of blood sugars and that a decreased appetite may also help.   She agreed to an appointment for retinal images on 04/13/19. Debera Lat, RD 03/25/2019 1:54 PM.

## 2019-04-01 ENCOUNTER — Other Ambulatory Visit: Payer: Self-pay | Admitting: Internal Medicine

## 2019-04-01 DIAGNOSIS — I10 Essential (primary) hypertension: Secondary | ICD-10-CM

## 2019-04-12 ENCOUNTER — Telehealth: Payer: Self-pay | Admitting: Dietician

## 2019-04-12 NOTE — Telephone Encounter (Signed)
Called patient to reschedule her retinal exam tomorrow. She agreed to reschedule when she comes in. She is excited about her diet changes that she has made and how they have lowered her blood sugars. She wants to walk and is still in the planning stages of making that happen.

## 2019-04-12 NOTE — Telephone Encounter (Signed)
Thank you so much. I appreciate it!

## 2019-04-13 ENCOUNTER — Other Ambulatory Visit: Payer: Self-pay

## 2019-04-13 ENCOUNTER — Ambulatory Visit: Payer: Self-pay | Admitting: Dietician

## 2019-04-13 ENCOUNTER — Encounter: Payer: Self-pay | Admitting: Internal Medicine

## 2019-04-13 ENCOUNTER — Ambulatory Visit: Payer: Self-pay | Admitting: Internal Medicine

## 2019-04-13 VITALS — BP 152/70 | HR 80 | Temp 97.7°F | Ht 63.0 in | Wt 207.3 lb

## 2019-04-13 DIAGNOSIS — Z79899 Other long term (current) drug therapy: Secondary | ICD-10-CM

## 2019-04-13 DIAGNOSIS — E559 Vitamin D deficiency, unspecified: Secondary | ICD-10-CM

## 2019-04-13 DIAGNOSIS — G4700 Insomnia, unspecified: Secondary | ICD-10-CM

## 2019-04-13 DIAGNOSIS — Z7984 Long term (current) use of oral hypoglycemic drugs: Secondary | ICD-10-CM

## 2019-04-13 DIAGNOSIS — F331 Major depressive disorder, recurrent, moderate: Secondary | ICD-10-CM

## 2019-04-13 DIAGNOSIS — I10 Essential (primary) hypertension: Secondary | ICD-10-CM

## 2019-04-13 DIAGNOSIS — R748 Abnormal levels of other serum enzymes: Secondary | ICD-10-CM

## 2019-04-13 DIAGNOSIS — D508 Other iron deficiency anemias: Secondary | ICD-10-CM

## 2019-04-13 DIAGNOSIS — E1140 Type 2 diabetes mellitus with diabetic neuropathy, unspecified: Secondary | ICD-10-CM

## 2019-04-13 DIAGNOSIS — IMO0002 Reserved for concepts with insufficient information to code with codable children: Secondary | ICD-10-CM

## 2019-04-13 DIAGNOSIS — D509 Iron deficiency anemia, unspecified: Secondary | ICD-10-CM

## 2019-04-13 DIAGNOSIS — E1165 Type 2 diabetes mellitus with hyperglycemia: Secondary | ICD-10-CM

## 2019-04-13 LAB — POCT GLYCOSYLATED HEMOGLOBIN (HGB A1C): Hemoglobin A1C: 11 % — AB (ref 4.0–5.6)

## 2019-04-13 LAB — GLUCOSE, CAPILLARY: Glucose-Capillary: 141 mg/dL — ABNORMAL HIGH (ref 70–99)

## 2019-04-13 MED ORDER — JANUMET XR 50-1000 MG PO TB24: 50.0000 mg | ORAL_TABLET | Freq: Two times a day (BID) | ORAL | 0 refills | Status: DC

## 2019-04-13 MED ORDER — FERROUS GLUCONATE 324 (38 FE) MG PO TABS: 324.0000 mg | ORAL_TABLET | Freq: Every day | ORAL | 1 refills | Status: DC

## 2019-04-13 MED FILL — JANUMET XR 50-1,000 MG TAB: 50-1000 | 30 days supply | Qty: 60 | Fill #0

## 2019-04-13 NOTE — Assessment & Plan Note (Signed)
The patient mentioned that she continues to not been able to sleep well throughout the night even though she has gone to sleep at the correct time. She has thoughts that she will die and no one will know about it and that someone was going to attack her. The patient stated that she stopped venlafaxine on her own after running out. She states that since stopping venlafaxine she has been sleeping 6-7 hrs.   Assessment and plan  Venlafaxine causes insomnia in 18% of individuals and therefore the patient should continue to remain off of it.   -stop venlafaxine  -Referral to Dessie Coma, behavioral health

## 2019-04-13 NOTE — Assessment & Plan Note (Addendum)
Patient stopped venlafaxine 37.5mg  when she ran out. She stated that she feels more energized after stopping. PHQ9 of 3.  Assessment and plan  Will monitor patient's symptoms and told her to follow with Miquel Dunn for behavioral therapy.   -Referral to [facility]

## 2019-04-13 NOTE — Progress Notes (Signed)
   CC: Hypertension follow up   HPI:  Sherri Gill is a 68 y.o. with essential htn, dm2, iron deficiency anemia who presented for htn follow up. Please see problem based charting for evaluation, assessment, and plan.  Past Medical History:  Diagnosis Date  . Chronic anemia 08/29/2012   Need colonoscopy or report. Need iron panel.   . Depression   . Dyslipidemia 06/30/2007  . Essential hypertension, benign 06/30/2007  . GERD 06/30/2007  . Insomnia 06/11/2012  . Obesity, BMI 35-40 06/11/2012  . Peripheral neuropathy 12/28/2009  . Type 2 diabetes mellitus, uncontrolled, with neuropathy ([place]) 06/30/2007  . Viral URI 01/23/2018   Review of Systems:    Review of Systems  Constitutional: Negative for chills and fever.  Respiratory: Negative for cough and shortness of breath.   Cardiovascular: Negative for chest pain.  Gastrointestinal: Negative for abdominal pain, nausea and vomiting.  Neurological: Negative for dizziness.  Psychiatric/Behavioral: Negative for depression. The patient is not nervous/anxious.    Physical Exam:  Vitals:   04/13/19 1434  BP: (!) 152/70  Pulse: 80  Temp: 97.7 F (36.5 C)  TempSrc: Oral  SpO2: 99%  Weight: 207 lb 4.8 oz (94 kg)  Height: 5\' 3"  (1.6 m)   Physical Exam  Constitutional: Appears well-developed and well-nourished. No distress.  HENT:  Head: Normocephalic and atraumatic.  Eyes: Conjunctivae are normal.  Cardiovascular: Normal rate, regular rhythm and normal heart sounds.  Respiratory: Effort normal and breath sounds normal. No respiratory distress. No wheezes.  GI: Soft. Bowel sounds are normal. No distension. There is no tenderness.  Musculoskeletal: No edema.  Neurological: Is alert.  Skin: Not diaphoretic. No erythema.  Psychiatric: Normal mood and affect. Behavior is normal. Judgment and thought content normal.   Assessment & Plan:   See Encounters Tab for problem based charting.  Patient discussed with Dr. Gilles Chiquito

## 2019-04-13 NOTE — Assessment & Plan Note (Signed)
CMP to recheck alp elevation of 163 in May 2017

## 2019-04-13 NOTE — Assessment & Plan Note (Signed)
The patient's blood pressure during this visit was 152/70. The patient is currently taking amlodipine 5 mg daily, hydrochlorothiazide 25 mg daily, losartan 100 mg daily,. His last blood pressure visits are   BP Readings from Last 3 Encounters:  04/13/19 (!) 152/70  11/20/18 134/63  03/16/18 (!) 143/58   The patient states that she occasionally has dizziness, but denies chest pain, sob.  Assessment and Plan Patient has been actively working on lifestyle modifications (diet and exercising) which should help decrease her blood pressure. Her prior blood pressure readings while on these medications have been at goal. Will continue on current therapy with addition of lifestyle modifications.

## 2019-04-13 NOTE — Assessment & Plan Note (Signed)
Patient's last vit d was 25 in 2018. She was told to take vit d 1000u daily, but she has not.   Assessment and plan   -I reminded the patient to take vitamin 1000u daily -will recheck vitamin d after she has used her supplementation for few months

## 2019-04-13 NOTE — Assessment & Plan Note (Signed)
Ferritin 43, iron 34, tibc 382, iron saturation 9 per labs May 2019. Patient mentioned that she stopped taking the iron 6 months ago and restarted it 2 weeks ago.  Assessment and plan  Patient is to continue ferrous gluconate 324mg . She stated that she   -recheck iron at next visit

## 2019-04-13 NOTE — Assessment & Plan Note (Signed)
The patient's last a1c= 10.6 on January 2020 and today her a1c is 11.0. The patient's home  blood glucose measurements over the past month have ranged 130s-390s. The patient is currently taking metformin 1000mg  bid, glipizide 10mg  qd. She mentioned that she has been more adherent with the medication for the past month.     Patient has gained 5lbs in the past 5 months. She states that over the past 1 month she has been cooking with more vegetables and fruits.   The patient was helping her brother due to his health problems and had forgotten to take her medication with her. For that reason she was started on glipizide as it was more affordable instead of Janumet.  Assessment and plan  The patient's a1c was previously controlled with Janumet which I have restarted for the patient and [facility] for [facility].

## 2019-04-13 NOTE — Patient Instructions (Addendum)
It was a pleasure to see you today Ms. [place]. Please make the following changes:  -stop taking metformin 1000mg  twice daily  -stop taking glipizide  -start taking sitagliptan-metformin 50-1000mg  twice daily  -continue taking vit d 1000u daily  -follow up in 1 month -continue to exercise and eat healthy  -you have been referred to behavioral health to help with your mood   If you have any questions or concerns, please call our clinic at [phone] between 9am-5pm and after hours call [phone] and ask for the internal medicine resident on call. If you feel you are having a medical emergency please call 911.   Thank you, we look forward to help you remain healthy!  Lars Mage, MD Internal Medicine PGY2

## 2019-04-14 ENCOUNTER — Encounter: Payer: Self-pay | Admitting: Licensed Clinical Social Worker

## 2019-04-14 ENCOUNTER — Telehealth: Payer: Self-pay | Admitting: Licensed Clinical Social Worker

## 2019-04-14 LAB — CMP14 + ANION GAP
ALT: 17 IU/L (ref 0–32)
AST: 15 IU/L (ref 0–40)
Albumin/Globulin Ratio: 1.6 (ref 1.2–2.2)
Albumin: 4.2 g/dL (ref 3.8–4.8)
Alkaline Phosphatase: 172 IU/L — ABNORMAL HIGH (ref 39–117)
Anion Gap: 18 mmol/L (ref 10.0–18.0)
BUN/Creatinine Ratio: 20 (ref 12–28)
BUN: 19 mg/dL (ref 8–27)
Bilirubin Total: 0.3 mg/dL (ref 0.0–1.2)
CO2: 26 mmol/L (ref 20–29)
Calcium: 10.2 mg/dL (ref 8.7–10.3)
Chloride: 95 mmol/L — ABNORMAL LOW (ref 96–106)
Creatinine, Ser: 0.96 mg/dL (ref 0.57–1.00)
GFR calc Af Amer: 72 mL/min/{1.73_m2} (ref >59)
GFR calc non Af Amer: 62 mL/min/{1.73_m2} (ref >59)
Globulin, Total: 2.6 g/dL (ref 1.5–4.5)
Glucose: 131 mg/dL — ABNORMAL HIGH (ref 65–99)
Potassium: 4.2 mmol/L (ref 3.5–5.2)
Sodium: 139 mmol/L (ref 134–144)
Total Protein: 6.8 g/dL (ref 6.0–8.5)

## 2019-04-14 NOTE — Telephone Encounter (Signed)
Patient was contacted due to a referral we received from her PCP for services. Patient did not answer, and no voicemail was available. Patient will be contacted again tomorrow, and a letter will be mailed.

## 2019-04-15 NOTE — Progress Notes (Signed)
Internal Medicine Clinic Attending  Case discussed with Dr. [name] at the time of the visit.  We reviewed the resident's history and exam and pertinent patient test results.  I agree with the assessment, diagnosis, and plan of care documented in the resident's note. 

## 2019-04-20 ENCOUNTER — Telehealth: Payer: Self-pay | Admitting: Licensed Clinical Social Worker

## 2019-04-20 NOTE — Telephone Encounter (Signed)
Patient was contacted due to a referral. Patient will be added to my schedule on 6/30 @ 1:30 for an in person visit.

## 2019-04-22 ENCOUNTER — Telehealth: Payer: Self-pay | Admitting: Licensed Clinical Social Worker

## 2019-04-22 ENCOUNTER — Other Ambulatory Visit: Payer: Self-pay | Admitting: Internal Medicine

## 2019-04-22 DIAGNOSIS — E1140 Type 2 diabetes mellitus with diabetic neuropathy, unspecified: Secondary | ICD-10-CM

## 2019-04-22 DIAGNOSIS — IMO0002 Reserved for concepts with insufficient information to code with codable children: Secondary | ICD-10-CM

## 2019-04-22 MED ORDER — JANUMET XR 50-1000 MG PO TB24: 50.0000 mg | ORAL_TABLET | Freq: Two times a day (BID) | ORAL | 0 refills | Status: DC

## 2019-04-22 MED ORDER — ATORVASTATIN CALCIUM 40 MG PO TABS: 40.0000 mg | ORAL_TABLET | Freq: Every day | ORAL | 0 refills | Status: DC

## 2019-04-22 MED FILL — JANUMET XR 50-1,000 MG TAB: 50-1000 | 30 days supply | Qty: 60 | Fill #0

## 2019-04-22 MED FILL — ATORVASTATIN 40 MG TABLET: 40 | 30 days supply | Qty: 30 | Fill #0

## 2019-04-22 NOTE — Telephone Encounter (Signed)
Patient was contacted to move her appointment to 2:00 on 6/30 due to a meeting conflict. Patient did not answer, and has no voicemail set up.

## 2019-04-22 NOTE — Telephone Encounter (Signed)
Refill Request- Pt leaving out of town for a month   atorvastatin (LIPITOR) 40 MG tablet  hydrochlorothiazide (HYDRODIURIL) 25 MG tablet  losartan (COZAAR) 100 MG tablet  amLODipine (NORVASC) 5 MG tablet   Pt asking for another prescription for   SitaGLIPtin-MetFORMIN HCl (JANUMET XR) 50-1000 MG TB24   Pt requesting to use the [facility]

## 2019-04-23 ENCOUNTER — Encounter: Payer: Self-pay | Admitting: Internal Medicine

## 2019-05-11 NOTE — Addendum Note (Signed)
Addended by: Hulan Fray on: 05/11/2019 07:09 PM   Modules accepted: Orders

## 2019-06-02 ENCOUNTER — Telehealth: Payer: Self-pay | Admitting: Licensed Clinical Social Worker

## 2019-06-02 ENCOUNTER — Ambulatory Visit: Payer: Self-pay | Admitting: Licensed Clinical Social Worker

## 2019-06-02 NOTE — Telephone Encounter (Signed)
Patient was contacted for her scheduled appointment. Patient did not answer, and there was no voicemail available.

## 2019-06-25 MED FILL — JANUMET XR 50-1,000 MG TAB: 50-1000 | 30 days supply | Qty: 60 | Fill #1

## 2019-06-25 MED FILL — ATORVASTATIN 40 MG TABLET: 40 | 30 days supply | Qty: 30 | Fill #1

## 2019-06-28 NOTE — Progress Notes (Signed)
   CC: Depression  HPI:  Sherri Gill is a 68 y.o. with essential htn, dm2, ida, mdd who presented for chronic disease management. Please see problem based charting for evaluation, assessment, and plan.  Past Medical History:  Diagnosis Date  . Chronic anemia 08/29/2012   Need colonoscopy or report. Need iron panel.   . Depression   . Dyslipidemia 06/30/2007  . Essential hypertension, benign 06/30/2007  . GERD 06/30/2007  . Insomnia 06/11/2012  . Obesity, BMI 35-40 06/11/2012  . Peripheral neuropathy 12/28/2009  . Type 2 diabetes mellitus, uncontrolled, with neuropathy ([place]) 06/30/2007  . Viral URI 01/23/2018   Review of Systems:    Review of Systems  Constitutional: Negative for chills and fever.  Cardiovascular: Negative for chest pain.  Gastrointestinal: Negative for nausea and vomiting.  Neurological: Negative for dizziness and headaches.  Psychiatric/Behavioral: Positive for depression. Negative for suicidal ideas. The patient has insomnia.    Physical Exam:  Vitals:   06/29/19 1049  BP: 139/64  Pulse: 98  Temp: 98.3 F (36.8 C)  TempSrc: Oral  SpO2: 100%  Weight: 202 lb 8 oz (91.9 kg)  Height: 5\' 3"  (1.6 m)   Physical Exam  Constitutional: Appears well-developed and well-nourished. No distress.  HENT:  Head: Normocephalic and atraumatic.  Eyes: Conjunctivae are normal.  Cardiovascular: Normal rate, regular rhythm and normal heart sounds.  Respiratory: Effort normal and breath sounds normal. No respiratory distress. No wheezes.  GI: Soft. Bowel sounds are normal. No distension. There is no tenderness.  Musculoskeletal: No edema.  Neurological: Is alert.  Skin: Not diaphoretic. No erythema.  Psychiatric: Patient tearful and anxious . Behavior is normal. Judgment and thought content normal.   Assessment & Plan:   See Encounters Tab for problem based charting.  Patient discussed with Dr. Angelia Mould

## 2019-06-29 ENCOUNTER — Ambulatory Visit (INDEPENDENT_AMBULATORY_CARE_PROVIDER_SITE_OTHER): Payer: Medicare Other | Admitting: Internal Medicine

## 2019-06-29 ENCOUNTER — Other Ambulatory Visit: Payer: Self-pay

## 2019-06-29 VITALS — BP 139/64 | HR 98 | Temp 98.3°F | Ht 63.0 in | Wt 202.5 lb

## 2019-06-29 DIAGNOSIS — E1140 Type 2 diabetes mellitus with diabetic neuropathy, unspecified: Secondary | ICD-10-CM | POA: Diagnosis not present

## 2019-06-29 DIAGNOSIS — R748 Abnormal levels of other serum enzymes: Secondary | ICD-10-CM | POA: Diagnosis not present

## 2019-06-29 DIAGNOSIS — D509 Iron deficiency anemia, unspecified: Secondary | ICD-10-CM | POA: Diagnosis not present

## 2019-06-29 DIAGNOSIS — IMO0002 Reserved for concepts with insufficient information to code with codable children: Secondary | ICD-10-CM

## 2019-06-29 DIAGNOSIS — E559 Vitamin D deficiency, unspecified: Secondary | ICD-10-CM

## 2019-06-29 DIAGNOSIS — E1165 Type 2 diabetes mellitus with hyperglycemia: Secondary | ICD-10-CM

## 2019-06-29 DIAGNOSIS — F331 Major depressive disorder, recurrent, moderate: Secondary | ICD-10-CM

## 2019-06-29 DIAGNOSIS — I10 Essential (primary) hypertension: Secondary | ICD-10-CM | POA: Diagnosis not present

## 2019-06-29 LAB — POCT GLYCOSYLATED HEMOGLOBIN (HGB A1C): Hemoglobin A1C: 9 % — AB (ref 4.0–5.6)

## 2019-06-29 LAB — GLUCOSE, CAPILLARY: Glucose-Capillary: 175 mg/dL — ABNORMAL HIGH (ref 70–99)

## 2019-06-29 MED ORDER — METFORMIN HCL 1000 MG PO TABS: 1000.0000 mg | ORAL_TABLET | Freq: Two times a day (BID) | ORAL | 1 refills | Status: DC

## 2019-06-29 MED ORDER — HYDROCHLOROTHIAZIDE 25 MG PO TABS: ORAL_TABLET | ORAL | 1 refills | Status: DC

## 2019-06-29 MED ORDER — BUPROPION HCL ER (SR) 100 MG PO TB12: 100.0000 mg | ORAL_TABLET | Freq: Two times a day (BID) | ORAL | 1 refills | Status: DC

## 2019-06-29 MED ORDER — LIRAGLUTIDE 18 MG/3ML ~~LOC~~ SOPN: PEN_INJECTOR | SUBCUTANEOUS | 1 refills | Status: DC

## 2019-06-29 MED ORDER — ATORVASTATIN CALCIUM 40 MG PO TABS: 40.0000 mg | ORAL_TABLET | Freq: Every day | ORAL | 2 refills | Status: DC

## 2019-06-29 MED ORDER — AMLODIPINE BESYLATE 5 MG PO TABS: 5.0000 mg | ORAL_TABLET | Freq: Every day | ORAL | 1 refills | Status: DC

## 2019-06-29 NOTE — Assessment & Plan Note (Signed)
The patient's blood pressure during this visit was 139/64. The patient is currently taking hctz 25mg  qd, losartan 100mg  qd, and amlodipine 5mg  qd. His last blood pressure visits are   BP Readings from Last 3 Encounters:  06/29/19 139/64  04/13/19 (!) 152/70  11/20/18 134/63   Assessment and Plan Patient's blood pressure is well controlled. Will continue current regimen.

## 2019-06-29 NOTE — Assessment & Plan Note (Signed)
The patient's last a1c=11 in June 2020 and during this visit a1c was 9.0. The patient's home blood glucose measurements over the past month have ranged 150s-300s. The patient does not note episodes of hypoglycemia.   The patient is currently taking sitagliptin-metformin 50-1000 mg bid. The patient is compliant with medication.    Patient has lost 5lbs in past 3 months. Patient has stopped drinking sodas.   Assessment and plan Although patient's blood glucose measurements has improved, they are still not at goal.   -Stop janumet 50-1000mg  bid -start metformin 1000mg  bid along with liraglutide -start liraglutide 0.6mg  daily and titrate up to 1.8mg  with a 0.6mg  increase weekly -follow up in 4 weeks

## 2019-06-29 NOTE — Assessment & Plan Note (Signed)
The patient has been adherent to ferrous gluconate 324mg  qd. Last iron studies were from May 2019: Iron 34, iron sat 9, tibc 382, ferritin 43.  -ordered colonoscopy to determine if she is bleeding from gi tract

## 2019-06-29 NOTE — Patient Instructions (Addendum)
It was a pleasure to see you today Sherri Gill. Please make the following changes:  -Please stop janumet  -Start metformin 1000mg  twice daily along with liraglutide (instructions are provided on the bottle: week 1 0.6mg  daily, week 2 1.2mg  dialy, week 3 onward 1.8mg  daily) -please start bupropion 100mg  twice daily  -I have checked vitamin d level today, will call you with results -please follow with ashton hicks weekly -please get colonoscopy done -please follow up in 4 weeks  If you have any questions or concerns, please call our clinic at [phone] between 9am-5pm and after hours call [phone] and ask for the internal medicine resident on call. If you feel you are having a medical emergency please call 911.   Thank you, we look forward to help you remain healthy!  Lars Mage, MD Internal Medicine PGY3  Liraglutide injection What is this medicine? LIRAGLUTIDE (LIR a GLOO tide) is used to improve blood sugar control in adults with type 2 diabetes. This medicine may be used with other diabetes medicines. This drug may also reduce the risk of heart attack or stroke if you have type 2 diabetes and risk factors for heart disease. This medicine may be used for other purposes; ask your health care provider or pharmacist if you have questions. COMMON BRAND NAME(S): Victoza What should I tell my health care provider before I take this medicine? They need to know if you have any of these conditions:  endocrine tumors (MEN 2) or if someone in your family had these tumors  gallbladder disease  high cholesterol  history of alcohol abuse problem  history of pancreatitis  kidney disease or if you are on dialysis  liver disease  previous swelling of the tongue, face, or lips with difficulty breathing, difficulty swallowing, hoarseness, or tightening of the throat  stomach problems  thyroid cancer or if someone in your family had thyroid cancer  an unusual or allergic reaction  to liraglutide, other medicines, foods, dyes, or preservatives  pregnant or trying to get pregnant  breast-feeding How should I use this medicine? This medicine is for injection under the skin of your upper leg, stomach area, or upper arm. You will be taught how to prepare and give this medicine. Use exactly as directed. Take your medicine at regular intervals. Do not take it more often than directed. It is important that you put your used needles and syringes in a special sharps container. Do not put them in a trash can. If you do not have a sharps container, call your pharmacist or healthcare provider to get one. A special MedGuide will be given to you by the pharmacist with each prescription and refill. Be sure to read this information carefully each time. Talk to your pediatrician regarding the use of this medicine in children. While this drug may be prescribed for children as young as 44 years of age, precautions do apply. Overdosage: If you think you have taken too much of this medicine contact a poison control center or emergency room at once. NOTE: This medicine is only for you. Do not share this medicine with others. What if I miss a dose? If you miss a dose, take it as soon as you can. If it is almost time for your next dose, take only that dose. Do not take double or extra doses. What may interact with this medicine?  other medicines for diabetes Many medications may cause changes in blood sugar, these include:  alcohol containing beverages  antiviral medicines for  HIV or AIDS  aspirin and aspirin-like drugs  certain medicines for blood pressure, heart disease, irregular heart beat  chromium  diuretics  female hormones, such as estrogens or progestins, birth control pills  fenofibrate  gemfibrozil  isoniazid  lanreotide  female hormones or anabolic steroids  MAOIs like Carbex, Eldepryl, Marplan, Nardil, and Parnate  medicines for weight loss  medicines for  allergies, asthma, cold, or cough  medicines for depression, anxiety, or psychotic disturbances  niacin  nicotine  NSAIDs, medicines for pain and inflammation, like ibuprofen or naproxen  octreotide  pasireotide  pentamidine  phenytoin  probenecid  quinolone antibiotics such as ciprofloxacin, levofloxacin, ofloxacin  some herbal dietary supplements  steroid medicines such as prednisone or cortisone  sulfamethoxazole; trimethoprim  thyroid hormones Some medications can hide the warning symptoms of low blood sugar (hypoglycemia). You may need to monitor your blood sugar more closely if you are taking one of these medications. These include:  beta-blockers, often used for high blood pressure or heart problems (examples include atenolol, metoprolol, propranolol)  clonidine  guanethidine  reserpine This list may not describe all possible interactions. Give your health care provider a list of all the medicines, herbs, non-prescription drugs, or dietary supplements you use. Also tell them if you smoke, drink alcohol, or use illegal drugs. Some items may interact with your medicine. What should I watch for while using this medicine? Visit your doctor or health care professional for regular checks on your progress. Drink plenty of fluids while taking this medicine. Check with your doctor or health care professional if you get an attack of severe diarrhea, nausea, and vomiting. The loss of too much body fluid can make it dangerous for you to take this medicine. A test called the HbA1C (A1C) will be monitored. This is a simple blood test. It measures your blood sugar control over the last 2 to 3 months. You will receive this test every 3 to 6 months. Learn how to check your blood sugar. Learn the symptoms of low and high blood sugar and how to manage them. Always carry a quick-source of sugar with you in case you have symptoms of low blood sugar. Examples include hard sugar candy or  glucose tablets. Make sure others know that you can choke if you eat or drink when you develop serious symptoms of low blood sugar, such as seizures or unconsciousness. They must get medical help at once. Tell your doctor or health care professional if you have high blood sugar. You might need to change the dose of your medicine. If you are sick or exercising more than usual, you might need to change the dose of your medicine. Do not skip meals. Ask your doctor or health care professional if you should avoid alcohol. Many nonprescription cough and cold products contain sugar or alcohol. These can affect blood sugar. Pens should never be shared. Even if the needle is changed, sharing may result in passing of viruses like hepatitis or HIV. Wear a medical ID bracelet or chain, and carry a card that describes your disease and details of your medicine and dosage times. What side effects may I notice from receiving this medicine? Side effects that you should report to your doctor or health care professional as soon as possible:  allergic reactions like skin rash, itching or hives, swelling of the face, lips, or tongue  breathing problems  diarrhea that continues or is severe  lump or swelling on the neck  severe nausea  signs and  symptoms of infection like fever or chills; cough; sore throat; pain or trouble passing urine  signs and symptoms of low blood sugar such as feeling anxious, confusion, dizziness, increased hunger, unusually weak or tired, sweating, shakiness, cold, irritable, headache, blurred vision, fast heartbeat, loss of consciousness  signs and symptoms of kidney injury like trouble passing urine or change in the amount of urine  trouble swallowing  unusual stomach upset or pain  vomiting Side effects that usually do not require medical attention (report to your doctor or health care professional if they continue or are bothersome):  constipation  decreased  appetite  diarrhea  fatigue  headache  nausea  pain, redness, or irritation at site where injected  stomach upset  stuffy or runny nose This list may not describe all possible side effects. Call your doctor for medical advice about side effects. You may report side effects to FDA at 1-800-FDA-1088. Where should I keep my medicine? Keep out of the reach of children. Store unopened pen in a refrigerator between 2 and 8 degrees C (36 and 46 degrees F). Do not freeze or use if the medicine has been frozen. Protect from light and excessive heat. After you first use the pen, it can be stored at room temperature between 15 and 30 degrees C (59 and 86 degrees F) or in a refrigerator. Throw away your used pen after 30 days or after the expiration date, whichever comes first. Do not store your pen with the needle attached. If the needle is left on, medicine may leak from the pen. NOTE: This sheet is a summary. It may not cover all possible information. If you have questions about this medicine, talk to your doctor, pharmacist, or health care provider.  2020 Elsevier/Gold Standard (2018-04-15 15:46:47)

## 2019-06-29 NOTE — Assessment & Plan Note (Signed)
The patient's phq9 score today is 9. Patient was previously taking venlafaxine 37.5mg  qd, but stopped it due to not being able to fall asleep. She states that she has been having interrupted sleep, some difficulty concentrating, and was tearful during exam. She is upset that she has not been able to visit people due to covid pandemic. Denies suicidal or homicidal ideation. Patient stated that there is stigma in the community about depression and that she feels that she does not get support.   Assessment and plan  Patient with uncontrolled depression.  -start bupropion 100mg  qd as it does not have a/e of weight gain  -continue working with Dessie Coma (behavioral health counselor). Sent message to [facility] requesting for her to speak with patient weekly. -follow up in 4 weeks

## 2019-06-30 ENCOUNTER — Telehealth: Payer: Self-pay | Admitting: Licensed Clinical Social Worker

## 2019-06-30 LAB — VITAMIN D 25 HYDROXY (VIT D DEFICIENCY, FRACTURES): Vit D, 25-Hydroxy: 27.3 ng/mL — ABNORMAL LOW (ref 30.0–100.0)

## 2019-06-30 NOTE — Telephone Encounter (Signed)
Patient was contacted to offer an appointment. Patient reported she wanted to call our office back to schedule.

## 2019-07-01 NOTE — Progress Notes (Signed)
Internal Medicine Clinic Attending  Case discussed with Dr. [name] at the time of the visit.  We reviewed the resident's history and exam and pertinent patient test results.  I agree with the assessment, diagnosis, and plan of care documented in the resident's note. 

## 2019-07-02 ENCOUNTER — Other Ambulatory Visit: Payer: Self-pay | Admitting: Internal Medicine

## 2019-07-02 DIAGNOSIS — I10 Essential (primary) hypertension: Secondary | ICD-10-CM

## 2019-07-02 DIAGNOSIS — F331 Major depressive disorder, recurrent, moderate: Secondary | ICD-10-CM

## 2019-07-02 DIAGNOSIS — IMO0002 Reserved for concepts with insufficient information to code with codable children: Secondary | ICD-10-CM

## 2019-07-02 DIAGNOSIS — E1140 Type 2 diabetes mellitus with diabetic neuropathy, unspecified: Secondary | ICD-10-CM

## 2019-07-02 MED ORDER — AMLODIPINE BESYLATE 5 MG PO TABS: 5.0000 mg | ORAL_TABLET | Freq: Every day | ORAL | 1 refills | Status: DC

## 2019-07-02 MED ORDER — METFORMIN HCL 1000 MG PO TABS: 1000.0000 mg | ORAL_TABLET | Freq: Two times a day (BID) | ORAL | 1 refills | Status: DC

## 2019-07-02 MED ORDER — HYDROCHLOROTHIAZIDE 25 MG PO TABS: ORAL_TABLET | ORAL | 1 refills | Status: DC

## 2019-07-02 MED ORDER — LIRAGLUTIDE 18 MG/3ML ~~LOC~~ SOPN: PEN_INJECTOR | SUBCUTANEOUS | 1 refills | Status: DC

## 2019-07-02 MED ORDER — BUPROPION HCL ER (SR) 100 MG PO TB12: 100.0000 mg | ORAL_TABLET | Freq: Two times a day (BID) | ORAL | 1 refills | Status: DC

## 2019-07-02 MED FILL — HYDROCHLOROTHIAZIDE 25 MG T: 25 | 30 days supply | Qty: 30 | Fill #0

## 2019-07-02 MED FILL — metFORMIN HCL 1000 MG TABS: 1000 | 30 days supply | Qty: 60 | Fill #0

## 2019-07-02 MED FILL — VICTOZA 2-PAK 18 MG/3 ML PE: 18 | 30 days supply | Qty: 6 | Fill #0

## 2019-07-02 MED FILL — buPROPion HCL ER (SR) 100 M: 100 | 30 days supply | Qty: 60 | Fill #0

## 2019-07-02 MED FILL — AMLODIPINE BESYLATE 5 MG TA: 5 | 30 days supply | Qty: 30 | Fill #0

## 2019-07-02 NOTE — Telephone Encounter (Signed)
Called pt  - stated medications are too expensive. Please re-send rxs with "IM Program" on them. Thanks

## 2019-07-02 NOTE — Telephone Encounter (Signed)
Patient would like a call back.  Needing IM Program attached to her prescriptions sent to [place].  Without that notation medication is too expensive.

## 2019-07-06 ENCOUNTER — Telehealth: Payer: Self-pay | Admitting: Dietician

## 2019-07-06 NOTE — Telephone Encounter (Signed)
Walking now 30-40 minutes a day and trying to make, better food choices with goal of having lower a1C on her next doctor visit. She reports her Blood sugars are 164 mg/dl upon waking, mostly less than  140 since started walking Request information on low to lower blood sugar, if she needs to eat a snack at bedtime and ideas for a healthy breakfast. information mailed per her request.

## 2019-07-14 ENCOUNTER — Telehealth: Payer: Self-pay | Admitting: *Deleted

## 2019-07-14 NOTE — Telephone Encounter (Signed)
Attempted to relay info below to patient. No answer and no VM set up. Hubbard Hartshorn, RN, BSN

## 2019-07-14 NOTE — Telephone Encounter (Signed)
Thanks Lauren!

## 2019-07-14 NOTE — Telephone Encounter (Signed)
Notes recorded by Lars Mage, MD on 06/30/2019 at 9:22 AM EDT  Patient's vitamin D level continues to be low at 27. Recommended the patient take 1000 to 2000 IUs daily. Attempted to call patient could not reach, will try again.

## 2019-07-22 ENCOUNTER — Encounter: Payer: Self-pay | Admitting: Internal Medicine

## 2019-07-22 MED FILL — AMLODIPINE BESYLATE 5 MG TA: 5 | 30 days supply | Qty: 30 | Fill #0

## 2019-07-22 MED FILL — JANUMET XR 50-1,000 MG TAB: 50-1000 | 30 days supply | Qty: 60 | Fill #2

## 2019-07-22 MED FILL — HYDROCHLOROTHIAZIDE 25 MG T: 25 | 30 days supply | Qty: 30 | Fill #0

## 2019-07-23 MED FILL — ATORVASTATIN 40 MG TABLET: 40 | 30 days supply | Qty: 30 | Fill #0

## 2019-08-03 ENCOUNTER — Encounter: Payer: Medicare Other | Admitting: Internal Medicine

## 2019-08-18 ENCOUNTER — Telehealth: Payer: Self-pay | Admitting: Internal Medicine

## 2019-08-18 NOTE — Telephone Encounter (Signed)
Pt requesting a call back about her Diabetes and her A1c. Offered patient to speak with Christene Slates.  Patient declined and wanted to speak with a nurse instead.  Pt is sch with her PCP Dr. Maricela Bo on 09/28/2019.

## 2019-08-19 NOTE — Telephone Encounter (Signed)
Called pt - no answer; and vm has not been set up,unable to leave a message. 

## 2019-08-24 MED FILL — buPROPion HCL ER (SR) 100 M: 100 | 30 days supply | Qty: 60 | Fill #0

## 2019-08-31 ENCOUNTER — Other Ambulatory Visit: Payer: Self-pay

## 2019-08-31 ENCOUNTER — Telehealth: Payer: Self-pay | Admitting: Licensed Clinical Social Worker

## 2019-08-31 ENCOUNTER — Ambulatory Visit (INDEPENDENT_AMBULATORY_CARE_PROVIDER_SITE_OTHER): Payer: Medicare Other | Admitting: Dietician

## 2019-08-31 DIAGNOSIS — Z23 Encounter for immunization: Secondary | ICD-10-CM

## 2019-08-31 DIAGNOSIS — Z713 Dietary counseling and surveillance: Secondary | ICD-10-CM | POA: Diagnosis not present

## 2019-08-31 DIAGNOSIS — E1140 Type 2 diabetes mellitus with diabetic neuropathy, unspecified: Secondary | ICD-10-CM | POA: Diagnosis not present

## 2019-08-31 DIAGNOSIS — IMO0002 Reserved for concepts with insufficient information to code with codable children: Secondary | ICD-10-CM

## 2019-08-31 DIAGNOSIS — Z6836 Body mass index (BMI) 36.0-36.9, adult: Secondary | ICD-10-CM

## 2019-08-31 DIAGNOSIS — I10 Essential (primary) hypertension: Secondary | ICD-10-CM

## 2019-08-31 LAB — GLUCOSE, CAPILLARY: Glucose-Capillary: 124 mg/dL — ABNORMAL HIGH (ref 70–99)

## 2019-08-31 MED FILL — VICTOZA 18 MG/3 ML INJECT P: 18 | 30 days supply | Qty: 9 | Fill #0

## 2019-08-31 NOTE — Patient Instructions (Addendum)
For Victoza- wash hands Gather pen, alcohol pad and pen needle Wipe pen top with alcohol Wipe cirle on abdomen Put pen needle on pen Take both caps off pen needle Prime the pen Turn to 0.6 then inject each day for 7 days THEN increase to 1.2 for 7 days if no nausea, if nausea call us Then increase to 1.8 each day after if no nausea, if nausea- call us  Side effect are weight loss and a little nausea that IF it happen usually goes away within a week. Carry tums or saltines to take if nausea occurs  Butch Penny [phone]

## 2019-08-31 NOTE — Telephone Encounter (Signed)
Requesting to speak with a nurse about meds. Please call pt back.  

## 2019-08-31 NOTE — Telephone Encounter (Signed)
Patient was called back due to requesting a phone call. Patient will be added to my schedule for 11/4 @ 12:00.

## 2019-08-31 NOTE — Telephone Encounter (Signed)
Made appt w/ donnap. For diab ed, she has questions about diab, exercise and medications. appt 11/3 pm Also she would like an appt with ashtonh.

## 2019-08-31 NOTE — Progress Notes (Signed)
  Medical Nutrition Therapy:  Appt start time: [id number] end time:  1615. Total time: 60 Visit # 1  08/31/2019 Sherri Gill [id number]  Assessment:  Primary concerns today: glucose control, weight loss and medications. Sherri Gill has not started the Victoza yet. She was educated today about how it works and it's side effects. She was also educated on how to give herself an injection and how to use the pen. She verbalized motivation to start the victoza soon when she left today. Dietary and physical activity changes reinforced.   Preferred Learning Style: No preference indicated  Learning Readiness: Change in progress  ANTHROPOMETRICS:Estimated body mass index is 36.03 kg/m as calculated from the following:   Height as of 06/29/19: 5\' 3"  (1.6 m).   Weight as of this encounter: 203 lb 6.4 oz (92.3 kg).  WEIGHT HISTORY:  Wt Readings from Last 5 Encounters:  08/31/19 203 lb 6.4 oz (92.3 kg)  06/29/19 202 lb 8 oz (91.9 kg)  04/13/19 207 lb 4.8 oz (94 kg)  11/20/18 202 lb 9.6 oz (91.9 kg)  03/16/18 206 lb 8 oz (93.7 kg)   SLEEP:need to assess at future visit  MEDICATIONS: see above.  BLOOD SUGAR:124 after eating a grilled cheese sandwich a few hours ago DIETARY INTAKE: Eats a half a plate of vegetable when she eats meals. Not done in detail today per patient preference.  Beverages: water, coffee  Usual physical activity: walks 6 days a week for up to 5500 steps per day this past saturday  Progress Towards Goal(s):  In progress.   Nutritional Diagnosis:  NB-1.1 Food and nutrition-related knowledge deficit As related to lack of educationa bout how victoza works and can help with her desired weight loss.  As evidenced by  her reports and questions.    Intervention:  Nutrition educational about victoza, self injection, Continuous glucose monitoring Action goal: pick up and start victoza as appropriate  Outcome goal: lower blood sugars and weight Coordination of care: Called pharmacy to  assure patient that the Victoza will cost her 4$. Requested refills for patient and prescription for pen needles for her to use with victoza  Teaching Method Utilized: Visual, Auditory,Hands on Handouts given during visit include: After visit summary Barriers to learning/adherence to lifestyle change: lack of support/educatio and and training Demonstrated degree of understanding via:  Teach Back   Monitoring/Evaluation:  Dietary intake, exercise, meter, and body weight prn  Sherri Gill, RD 08/31/2019 5:11 PM. .

## 2019-08-31 NOTE — Telephone Encounter (Signed)
Sherri Gill also requests her blood pressure prescriptions with IM program in the notes to pharmacy and pen needles as she may stay out of town through thanksgiving.

## 2019-08-31 NOTE — Telephone Encounter (Signed)
Patient requests 1 refill on janument as she is traveling this week for possibly a few weeks and does not want to start victoza until she returns. Blood sugar 124 this afternoon on current regimen with walking 6 days a week.

## 2019-09-01 ENCOUNTER — Telehealth: Payer: Self-pay | Admitting: *Deleted

## 2019-09-01 ENCOUNTER — Ambulatory Visit (INDEPENDENT_AMBULATORY_CARE_PROVIDER_SITE_OTHER): Payer: Medicare Other | Admitting: Licensed Clinical Social Worker

## 2019-09-01 ENCOUNTER — Encounter: Payer: Self-pay | Admitting: Licensed Clinical Social Worker

## 2019-09-01 DIAGNOSIS — F331 Major depressive disorder, recurrent, moderate: Secondary | ICD-10-CM

## 2019-09-01 MED ORDER — LOSARTAN POTASSIUM 100 MG PO TABS: 100.0000 mg | ORAL_TABLET | Freq: Every day | ORAL | 1 refills | Status: DC

## 2019-09-01 MED ORDER — AMLODIPINE BESYLATE 5 MG PO TABS: 5.0000 mg | ORAL_TABLET | Freq: Every day | ORAL | 1 refills | Status: DC

## 2019-09-01 MED ORDER — INSULIN PEN NEEDLE 32G X 4 MM MISC: 1.0000 | Freq: Every day | 3 refills | Status: DC

## 2019-09-01 MED ORDER — HYDROCHLOROTHIAZIDE 25 MG PO TABS: ORAL_TABLET | ORAL | 1 refills | Status: DC

## 2019-09-01 MED FILL — LOSARTAN POTASSIUM 100 MG T: 100 | 30 days supply | Qty: 30 | Fill #0

## 2019-09-01 MED FILL — AMLODIPINE BESYLATE 5 MG TA: 5 | 30 days supply | Qty: 30 | Fill #0

## 2019-09-01 MED FILL — HYDROCHLOROTHIAZIDE 25 MG T: 25 | 30 days supply | Qty: 30 | Fill #0

## 2019-09-01 MED FILL — UNIFINE PENTIPS 32GX5/32: 32G X 4 MM | 30 days supply | Qty: 100 | Fill #0

## 2019-09-01 NOTE — Telephone Encounter (Signed)
filled

## 2019-09-01 NOTE — Telephone Encounter (Signed)
Sherri Gill wanted the prescriptions to go to [facility] because she cannot afford them at [facility]. Will ask triage nurse to call then in.

## 2019-09-01 NOTE — Telephone Encounter (Signed)
[facility] losartan, amlodipine, hctz and pen needles, cancelled at [facility]

## 2019-09-01 NOTE — BH Specialist Note (Signed)
Integrated Behavioral Health Visit via Telemedicine (Telephone)  09/01/2019 KASHLYN LANGILL [id number]   Session Start time: 12:00  Session End time: 12:30 Total time: 30 minutes  Referring Provider: Dr. Peri Jefferson Type of Visit: Telephonic Patient location: Home [facility] Provider location: Office All persons participating in visit: Patient and [facility]  Confirmed patient's address: Yes  Confirmed patient's phone number: Yes  Any changes to demographics: No   Discussed confidentiality: Yes    The following statements were read to the patient and/or legal guardian that are established with the [facility] Provider.  "The purpose of this phone visit is to provide behavioral health care while limiting exposure to the coronavirus (COVID19).  There is a possibility of technology failure and discussed alternative modes of communication if that failure occurs."  "By engaging in this telephone visit, you consent to the provision of healthcare.  Additionally, you authorize for your insurance to be billed for the services provided during this telephone visit."   Patient and/or legal guardian consented to telephone visit: Yes   PRESENTING CONCERNS: Patient and/or family reports the following symptoms/concerns: sadness, grief, loneliness Duration of problem: increased since March; Severity of problem: moderate  STRENGTHS (Protective Factors/Coping Skills): Recently started walking  GOALS ADDRESSED: Patient will: 1.  Reduce symptoms of: anxiety, depression and stress  2.  Increase knowledge and/or ability of: coping skills, healthy habits and stress reduction  3.  Demonstrate ability to: Increase healthy adjustment to current life circumstances, Increase adequate support systems for patient/family, Increase motivation to adhere to plan of care and Begin healthy grieving over loss  INTERVENTIONS: Interventions utilized:  Behavioral Activation, Brief CBT, Supportive Counseling and Medication  Monitoring Standardized Assessments completed: assessed for SI, HI, and self-harm.  ASSESSMENT: Patient currently experiencing depression and loneliness. Patient reported that she has crying spells, moments of confusion, fears of failing in life, and is sad. Patient used to take Effexor, but stopped taking this medication in May due to thinking it was causing insomnia.  Patient recently started Wellbutrin on 10/27, and is reporting no changes yet at this time. Patient was informed it could take a few weeks to see benefits. Patient lost her job in March, and has not worked since.  Patient reported that she started walking in September as a healthy way to deal with her sadness. Patient is contemplating getting a part time job just to fill her loneliness.   Patient would like to move her mother's home in [place] due to not having to pay rent. Patient thinks this would help her financial situation. Patient has a frequent negative thought that she will fail at life, and could benefit from learning how to challenge this thought.   Patient reported that she would never take her own life.   Patient may benefit from counseling.  PLAN: 1. Follow up with behavioral health clinician on : two weeks.   Dessie Coma, [facility], [place]

## 2019-09-09 ENCOUNTER — Ambulatory Visit: Payer: Medicare Other

## 2019-09-10 MED FILL — ATORVASTATIN 40 MG TABLET: 40 | 30 days supply | Qty: 30 | Fill #1

## 2019-09-13 ENCOUNTER — Telehealth: Payer: Self-pay

## 2019-09-13 NOTE — Telephone Encounter (Signed)
Returned call to patient. States today is the first time she has taken victoza as she wanted to take all the remaining Janumet she had first. She wanted to know if she should continue metformin. Instructed per OV on 06/29/2019 patient was to start on Victoza and continue metformin. Reviewed weekly dose increase of Victoza till she gets to 1.8 mg daily. She is aware she can keep current pen at room temp and to protect from heat and light. All other pens should be stored in refrigerator. She knows to take Victoza at same time each day with or without food. Hubbard Hartshorn, BSN, RN-BC

## 2019-09-13 NOTE — Telephone Encounter (Signed)
Requesting to speak with a nurse about meds. Please call pt back.  

## 2019-09-14 NOTE — Telephone Encounter (Signed)
Spoke with patient on 09/13/2019 for different concern but relayed this info to her. She stated that PCP had given her a Rx for Vit D and she has been taking it daily. Hubbard Hartshorn, BSN, RN-BC

## 2019-09-14 NOTE — Telephone Encounter (Signed)
Thank you :)

## 2019-09-15 ENCOUNTER — Other Ambulatory Visit: Payer: Self-pay | Admitting: Internal Medicine

## 2019-09-15 ENCOUNTER — Telehealth: Payer: Self-pay

## 2019-09-15 DIAGNOSIS — IMO0002 Reserved for concepts with insufficient information to code with codable children: Secondary | ICD-10-CM

## 2019-09-15 DIAGNOSIS — E1140 Type 2 diabetes mellitus with diabetic neuropathy, unspecified: Secondary | ICD-10-CM

## 2019-09-15 NOTE — Telephone Encounter (Signed)
Requesting to speak with a nurse about   liraglutide (VICTOZA) 18 MG/3ML SOPN   Please call pt back.

## 2019-09-15 NOTE — Telephone Encounter (Signed)
Sounds good, thank you 

## 2019-09-15 NOTE — Telephone Encounter (Signed)
Asked to call  Ms drilling about her concerns about Victoza. She states that she started victoza and it gave her indigestion, no appetite.at first said she wants to go back n her other medicine. We talked about how it scared her to suddenly have a change in appetite and fear of too much weight loss. I encouraged her to eat small frequent healthy meals and snacks.  At the end of the call. she agreed to stay on the [place] until she sees Dr. Maricela Bo in December. In the mean time, she intends to come in to be weighed for assurance that she is okay and not losing too much weight.

## 2019-09-16 ENCOUNTER — Telehealth: Payer: Self-pay | Admitting: Licensed Clinical Social Worker

## 2019-09-16 ENCOUNTER — Ambulatory Visit: Payer: Medicare Other | Admitting: Licensed Clinical Social Worker

## 2019-09-16 NOTE — Telephone Encounter (Signed)
Pt calling back as she has missed your call.  Please call patient back for her VIA Phone Appt today.

## 2019-09-16 NOTE — Telephone Encounter (Signed)
Called patient twice for her scheduled appointment. Vm was not set up, and the patient did not answer.

## 2019-09-21 NOTE — Telephone Encounter (Signed)
Patient called back, no answer, and no vm set up.

## 2019-09-28 ENCOUNTER — Other Ambulatory Visit: Payer: Self-pay

## 2019-09-28 ENCOUNTER — Encounter: Payer: Self-pay | Admitting: Internal Medicine

## 2019-09-28 ENCOUNTER — Ambulatory Visit (INDEPENDENT_AMBULATORY_CARE_PROVIDER_SITE_OTHER): Payer: Medicare Other | Admitting: Internal Medicine

## 2019-09-28 VITALS — BP 143/75 | HR 92 | Temp 98.1°F | Ht 63.0 in | Wt 195.2 lb

## 2019-09-28 DIAGNOSIS — F331 Major depressive disorder, recurrent, moderate: Secondary | ICD-10-CM

## 2019-09-28 DIAGNOSIS — R748 Abnormal levels of other serum enzymes: Secondary | ICD-10-CM

## 2019-09-28 DIAGNOSIS — E1140 Type 2 diabetes mellitus with diabetic neuropathy, unspecified: Secondary | ICD-10-CM | POA: Diagnosis not present

## 2019-09-28 DIAGNOSIS — E1165 Type 2 diabetes mellitus with hyperglycemia: Secondary | ICD-10-CM

## 2019-09-28 DIAGNOSIS — IMO0002 Reserved for concepts with insufficient information to code with codable children: Secondary | ICD-10-CM

## 2019-09-28 DIAGNOSIS — I10 Essential (primary) hypertension: Secondary | ICD-10-CM | POA: Diagnosis not present

## 2019-09-28 DIAGNOSIS — D509 Iron deficiency anemia, unspecified: Secondary | ICD-10-CM | POA: Diagnosis not present

## 2019-09-28 LAB — POCT GLYCOSYLATED HEMOGLOBIN (HGB A1C): Hemoglobin A1C: 7.4 % — AB (ref 4.0–5.6)

## 2019-09-28 LAB — GLUCOSE, CAPILLARY: Glucose-Capillary: 115 mg/dL — ABNORMAL HIGH (ref 70–99)

## 2019-09-28 MED ORDER — SITAGLIPTIN PHOS-METFORMIN HCL 50-1000 MG PO TABS: 1.0000 | ORAL_TABLET | Freq: Two times a day (BID) | ORAL | 0 refills | Status: DC

## 2019-09-28 MED ORDER — BUPROPION HCL ER (XL) 150 MG PO TB24: 150.0000 mg | ORAL_TABLET | Freq: Every day | ORAL | 0 refills | Status: DC

## 2019-09-28 MED ORDER — LIRAGLUTIDE 18 MG/3ML ~~LOC~~ SOPN: 1.2000 mg | PEN_INJECTOR | Freq: Every day | SUBCUTANEOUS | 1 refills | Status: DC

## 2019-09-28 MED FILL — JANUMET 50-1,000 MG TABLET: 50-1000 | 30 days supply | Qty: 60 | Fill #0

## 2019-09-28 MED FILL — buPROPion HCL ER (XL) 150 M: 150 | 30 days supply | Qty: 30 | Fill #0

## 2019-09-28 NOTE — Assessment & Plan Note (Signed)
Patient's last iron studies were done 03/16/18 which showed iron 34, iron saturation 9%, tibc 382.   The patient is currently being prescribed ferrous gluconate 324mg  qd. Patient needs a colonoscopy to evaluate reason for iron deficiency.   -Referral to GI has already been placed  -Repeat iron studies at folllow up visit in 1 month per patient request of not being stuck at this visit

## 2019-09-28 NOTE — Assessment & Plan Note (Signed)
Patient states that she has continued to have episodes of tearfulness and feeling down. However, she states that she feels better since prior visit.   -Switched bupropion from twice daily to extended release 150mg  qd  -continue integrated behavioral health visits

## 2019-09-28 NOTE — Progress Notes (Signed)
Internal Medicine Clinic Attending  Case discussed with Dr. [name] at the time of the visit.  We reviewed the resident's history and exam and pertinent patient test results.  I agree with the assessment, diagnosis, and plan of care documented in the resident's note. 

## 2019-09-28 NOTE — Assessment & Plan Note (Addendum)
The patient's last a1c=9.0 on 06/29/19. The patient's home blood glucose measurements over the past month have ranged 90-. The patient does/does not note episodes of hypoglycemia.   The patient is currently taking metformin 1000mg  bid, liraglutide 1.8mg  qd . The patient is compliant with medication.    Patient's has lost 11lbs in the past 6 months. She has been walking at least 1 mile (30-81min) at least 5-6 days a week. She has also has eliminate sodas from her diet.   Assessment and plan  Patient's a1c at this visit decreased down to 7.4 from 9. I congratulated patient on this reduction and her efforts to bring it down.   Will add on januvia to further decrease blood glucose levels.   -started janumet 50-1000mg  qd  -continue liraglutide 1.2mg  qd  -continue exercise and lifestyle modifications  -follow up in 1 month

## 2019-09-28 NOTE — Progress Notes (Signed)
   CC: Diabetes mellitus follow up  HPI:  [name] is a 68 y.o. female with hypertension, dm2, iron deficiency anemia who presents for diabetes follow up. Please see problem based charting for evaluation, assessment, and plan.   Past Medical History:  Diagnosis Date  . Chronic anemia 08/29/2012   Need colonoscopy or report. Need iron panel.   . Depression   . Dyslipidemia 06/30/2007  . Essential hypertension, benign 06/30/2007  . GERD 06/30/2007  . Insomnia 06/11/2012  . Obesity, BMI 35-40 06/11/2012  . Peripheral neuropathy 12/28/2009  . Type 2 diabetes mellitus, uncontrolled, with neuropathy ([place]) 06/30/2007  . Viral URI 01/23/2018   Review of Systems:    Review of Systems  Constitutional: Positive for weight loss. Negative for chills, fever and malaise/fatigue.  Respiratory: Negative for cough.   Gastrointestinal: Negative for abdominal pain, diarrhea, nausea and vomiting.  Neurological: Negative for dizziness and headaches.  Psychiatric/Behavioral: Positive for depression.   Physical Exam:  Vitals:   09/28/19 1110  Weight: 195 lb 3.2 oz (88.5 kg)  Height: 5\' 3"  (1.6 m)   Physical Exam  Constitutional: Appears well-developed and well-nourished. No distress.  HENT:  Head: Normocephalic and atraumatic.  Eyes: Conjunctivae are normal.  Cardiovascular: Normal rate, regular rhythm and normal heart sounds.  Respiratory: Effort normal and breath sounds normal. No respiratory distress. No wheezes.  GI: Soft. Bowel sounds are normal. No distension. There is no tenderness.  Musculoskeletal: No edema.  Neurological: Is alert.  Skin: Not diaphoretic. No erythema.  Diabetic foot exam: Patient's foot is calloused on exam, no superficial ulcerations. DP pulse 2+, vibratory sensation intact. Psychiatric: Normal mood and affect. Behavior is normal. Judgment and thought content normal.   Assessment & Plan:   See Encounters Tab for problem based charting.  Patient discussed with  Dr. Philipp Ovens

## 2019-09-28 NOTE — Patient Instructions (Addendum)
It was a pleasure to see you today Ms. Touchton. Please make the following changes:  CONGRATULATIONS ON YOUR WEIGHT LOSS. I AM SO GLAD YOU ARE EXERCISING AND STOPPED DRINKING SODAS! KEEP IT UP!  For your diabetes  -stop taking metformin  -start taking janumet 50-1000mg  twice daily   For your hypertension  Continue your current medications    Get colonoscopy done   For your other problems please make sure you take all the medications as listed on this form  If you have any questions or concerns, please call our clinic at [phone] between 9am-5pm and after hours call [phone] and ask for the internal medicine resident on call. If you feel you are having a medical emergency please call 911.   Thank you, we look forward to help you remain healthy!  Lars Mage, MD Internal Medicine PGY3

## 2019-09-28 NOTE — Assessment & Plan Note (Signed)
The patient's blood pressure during this visit was 146/68 and repeat 143/75. The patient is currently taking hctz 25mg  qd, amlodipine 5mg  qd, and losartan 100mg  qd. Her last blood pressure visits are   BP Readings from Last 3 Encounters:  09/28/19 (!) 143/75  06/29/19 139/64  04/13/19 (!) 152/70   Assessment and Plan Continue current therapy.

## 2019-09-28 NOTE — Assessment & Plan Note (Signed)
Patient had an elevated alp level per cmp in 2017. GGT was normal at that time. She has a low vitamin d level of 27 in September 2020.   -continue vitamin d 1000u daily  -Will repeat cmp at next visit

## 2019-09-30 ENCOUNTER — Telehealth: Payer: Self-pay | Admitting: Dietician

## 2019-09-30 ENCOUNTER — Ambulatory Visit: Payer: Medicare Other

## 2019-09-30 NOTE — Telephone Encounter (Signed)
Called to discuss her A1C results and diabetes support. Is taking both Victoza and janumet

## 2019-10-05 ENCOUNTER — Telehealth: Payer: Self-pay | Admitting: Licensed Clinical Social Worker

## 2019-10-05 NOTE — Telephone Encounter (Signed)
Patient was called to try to have her appointment rescheduled. Patient agreed, and will be added to my schedule.

## 2019-10-06 ENCOUNTER — Other Ambulatory Visit: Payer: Self-pay

## 2019-10-06 ENCOUNTER — Encounter: Payer: Self-pay | Admitting: Licensed Clinical Social Worker

## 2019-10-06 ENCOUNTER — Ambulatory Visit (INDEPENDENT_AMBULATORY_CARE_PROVIDER_SITE_OTHER): Payer: Medicare Other | Admitting: Licensed Clinical Social Worker

## 2019-10-06 DIAGNOSIS — F331 Major depressive disorder, recurrent, moderate: Secondary | ICD-10-CM

## 2019-10-06 NOTE — BH Specialist Note (Signed)
Integrated Behavioral Health Visit via Telemedicine (Telephone)  10/06/2019 ELOAH COWDREY [id number]   Session Start time: 12:05  Session End time: 12:30 Total time: 25 minutes  Referring Provider: Dr. Maricela Bo Type of Visit: Telephonic Patient location: Home [facility] Provider location: Office All persons participating in visit: [facility] and patient  Confirmed patient's address: Yes  Confirmed patient's phone number: Yes  Any changes to demographics: No   Discussed confidentiality: Yes    The following statements were read to the patient and/or legal guardian that are established with the [facility] Provider.  "The purpose of this phone visit is to provide behavioral health care while limiting exposure to the coronavirus (COVID19).  There is a possibility of technology failure and discussed alternative modes of communication if that failure occurs."  "By engaging in this telephone visit, you consent to the provision of healthcare.  Additionally, you authorize for your insurance to be billed for the services provided during this telephone visit."   Patient and/or legal guardian consented to telephone visit: Yes   PRESENTING CONCERNS: Patient and/or family reports the following symptoms/concerns: issues with sleep, depression, grief, and loneliness.  Duration of problem: increased since March; Severity of problem: moderate  STRENGTHS (Protective Factors/Coping Skills): Walking, reading, and listening to music.   GOALS ADDRESSED: Patient will: 1.  Reduce symptoms of: depression, insomnia and grief.  2.  Increase knowledge and/or ability of: coping skills, healthy habits and stress reduction  3.  Demonstrate ability to: Increase healthy adjustment to current life circumstances, Increase adequate support systems for patient/family and Begin healthy grieving over loss  INTERVENTIONS: Interventions utilized:  Brief CBT, Supportive Counseling, Medication Monitoring and Sleep  Hygiene Standardized Assessments completed: assessed for SI, HI, and self-harm.  ASSESSMENT: Patient currently experiencing moderate levels of depression, but is reporting a significant reduction in the severity of her depression. Patient processed issues with sleep and racing heart. Patient recently started Wellbutrin on October 27th. Patient believes the Wellbutrin is disrupting her sleep pattern. Patient has started taking Benadryl to assist with falling and staying asleep. Besides issues with sleep, the patient feels this medication has been helpful in reducing her depression symptoms. Patient identified reading, listening to music, walking, and implementing healthy coping skills as a way to cope with her sadness. Patient is making improvements with her ability to challenge her negative thought patterns.   Patient may benefit from counseling.  PLAN: 1. Follow up with behavioral health clinician on : three weeks. Dessie Coma, [place], [facility]

## 2019-10-07 MED FILL — ATORVASTATIN 40 MG TABLET: 40 | 30 days supply | Qty: 30 | Fill #2

## 2019-10-07 MED FILL — HYDROCHLOROTHIAZIDE 25 MG T: 25 | 30 days supply | Qty: 30 | Fill #1

## 2019-10-07 MED FILL — LOSARTAN POTASSIUM 100 MG T: 100 | 30 days supply | Qty: 30 | Fill #1

## 2019-10-07 MED FILL — AMLODIPINE BESYLATE 5 MG TA: 5 | 30 days supply | Qty: 30 | Fill #1

## 2019-10-08 MED FILL — VICTOZA 18 MG/3 ML INJECT P: 18 | 30 days supply | Qty: 9 | Fill #1

## 2019-10-27 ENCOUNTER — Encounter: Payer: Self-pay | Admitting: Licensed Clinical Social Worker

## 2019-10-27 ENCOUNTER — Other Ambulatory Visit: Payer: Self-pay

## 2019-10-27 ENCOUNTER — Ambulatory Visit (INDEPENDENT_AMBULATORY_CARE_PROVIDER_SITE_OTHER): Payer: Medicare Other | Admitting: Licensed Clinical Social Worker

## 2019-10-27 DIAGNOSIS — F331 Major depressive disorder, recurrent, moderate: Secondary | ICD-10-CM

## 2019-10-27 NOTE — BH Specialist Note (Signed)
Integrated Behavioral Health Visit via Telemedicine (Telephone)  10/27/2019 ADELIZ ABERG [id number]   Session Start time: 12:00  Session End time: 12:30 Total time: 30 minutes  Referring Provider: Dr.[name] Type of Visit: Telephonic Patient location: Home [facility] Provider location: Office All persons participating in visit: Patient and [facility]  Confirmed patient's address: No  Confirmed patient's phone number: Yes  Any changes to demographics: No   Discussed confidentiality: Yes    The following statements were read to the patient and/or legal guardian that are established with the [facility] Provider.  "The purpose of this phone visit is to provide behavioral health care while limiting exposure to the coronavirus (COVID19).  There is a possibility of technology failure and discussed alternative modes of communication if that failure occurs."  "By engaging in this telephone visit, you consent to the provision of healthcare.  Additionally, you authorize for your insurance to be billed for the services provided during this telephone visit."   Patient and/or legal guardian consented to telephone visit: Yes   PRESENTING CONCERNS: Patient and/or family reports the following symptoms/concerns: issues with sleep, depression, grief, and loneliness.  Duration of problem: since March; Severity of problem: mild  STRENGTHS (Protective Factors/Coping Skills): Walking, reading, and listening to music.   GOALS ADDRESSED: Patient will: 1.  Reduce symptoms of: depression and recent grief.  2.  Increase knowledge and/or ability of: coping skills and healthy habits  3.  Demonstrate ability to: Increase healthy adjustment to current life circumstances, Increase adequate support systems for patient/family and Begin healthy grieving over loss  INTERVENTIONS: Interventions utilized:  Motivational Interviewing, Mindfulness or Relaxation Training, Brief CBT and Supportive  Counseling Standardized Assessments completed: assessed for SI, HI, and self-harm.  ASSESSMENT: Patient currently experiencing mild levels of depression. Patient reported improvements with her mood. Patient identified exercise, medication, and therapy as beneficial. Improvements with challenging negative thoughts that lead to sadness. Patient has recently lost multiple close friends/family members. Patient is grieving.   Patient may benefit from counseling.  PLAN: 1. Follow up with behavioral health clinician on : two weeks.  Dessie Coma, [facility], [place]

## 2019-11-10 ENCOUNTER — Telehealth: Payer: Self-pay | Admitting: Licensed Clinical Social Worker

## 2019-11-10 ENCOUNTER — Ambulatory Visit: Payer: Medicare Other | Admitting: Licensed Clinical Social Worker

## 2019-11-10 NOTE — Telephone Encounter (Signed)
Patient was called three times for her scheduled appointment. Patient did not answer any of the calls. Patient has no vm to leave a message about missed appointment.

## 2019-11-12 ENCOUNTER — Other Ambulatory Visit: Payer: Self-pay | Admitting: Internal Medicine

## 2019-11-12 DIAGNOSIS — F331 Major depressive disorder, recurrent, moderate: Secondary | ICD-10-CM

## 2019-11-12 MED FILL — JANUMET 50-1,000 MG TABLET: 50-1000 | 15 days supply | Qty: 30 | Fill #1

## 2019-11-12 MED FILL — HYDROCHLOROTHIAZIDE 25 MG T: 25 | 30 days supply | Qty: 30 | Fill #2

## 2019-11-12 MED FILL — LOSARTAN POTASSIUM 100 MG T: 100 | 30 days supply | Qty: 30 | Fill #2

## 2019-11-12 MED FILL — AMLODIPINE BESYLATE 5 MG TA: 5 | 30 days supply | Qty: 30 | Fill #2

## 2019-11-12 MED FILL — ATORVASTATIN 40 MG TABLET: 40 | 30 days supply | Qty: 30 | Fill #3

## 2019-11-15 ENCOUNTER — Other Ambulatory Visit: Payer: Self-pay | Admitting: Internal Medicine

## 2019-11-15 DIAGNOSIS — IMO0002 Reserved for concepts with insufficient information to code with codable children: Secondary | ICD-10-CM

## 2019-11-15 DIAGNOSIS — E1140 Type 2 diabetes mellitus with diabetic neuropathy, unspecified: Secondary | ICD-10-CM

## 2019-11-15 MED FILL — buPROPion HCL ER (XL) 150 M: 150 | 30 days supply | Qty: 30 | Fill #0

## 2019-11-18 ENCOUNTER — Telehealth: Payer: Self-pay | Admitting: Dietician

## 2019-11-18 NOTE — Telephone Encounter (Signed)
Tried calling this patient. Their voicemail box is not set up. I was unable to leave a message  

## 2019-11-19 ENCOUNTER — Other Ambulatory Visit: Payer: Self-pay | Admitting: Internal Medicine

## 2019-11-19 DIAGNOSIS — E1140 Type 2 diabetes mellitus with diabetic neuropathy, unspecified: Secondary | ICD-10-CM

## 2019-11-19 DIAGNOSIS — IMO0002 Reserved for concepts with insufficient information to code with codable children: Secondary | ICD-10-CM

## 2019-11-19 MED FILL — UNIFINE PENTIPS 32GX5/32: 32G X 4 MM | 30 days supply | Qty: 100 | Fill #1

## 2019-11-22 NOTE — Telephone Encounter (Signed)
Shared with Ms Osby the local covid-19 vaccination schedule/plan.

## 2019-11-27 NOTE — Progress Notes (Deleted)
   CC: ***  HPI:  [name] is a 69 y.o. female with essential htn, dm2, mdd, ida who presented for diabetes mellitus. Please see problem based charting for evaluation, assessment, and plan.  dm2 The patient's last a1c=*** on ***. The patient's home  blood glucose measurements over the past month have ranged ***. The patient does/does not note episodes of hypoglycemia.   The patient is currently taking janumet 50-1000mg  bid and victoza 1.8mg  qd. The patient is compliant with medication.    Patient's weight changes ***  htn  The patient's blood pressure during this visit was ***. The patient is currently taking hctz 25mg  qd, amlodipine 5mg  qd, losartan 100mg  qd. His/her *** last blood pressure visits are  BP Readings from Last 3 Encounters:  09/28/19 (!) 143/75  06/29/19 139/64  04/13/19 (!) 152/70    The patient does/does not *** report palpitations, dizziness, chest pain, sob ***.  Assessment and Plan ***   Past Medical History:  Diagnosis Date  . Chronic anemia 08/29/2012   Need colonoscopy or report. Need iron panel.   . Depression   . Dyslipidemia 06/30/2007  . Essential hypertension, benign 06/30/2007  . GERD 06/30/2007  . Insomnia 06/11/2012  . Obesity, BMI 35-40 06/11/2012  . Peripheral neuropathy 12/28/2009  . Type 2 diabetes mellitus, uncontrolled, with neuropathy ([place]) 06/30/2007  . Viral URI 01/23/2018   Review of Systems:  ***  Physical Exam:  There were no vitals filed for this visit. ***  Assessment & Plan:   See Encounters Tab for problem based charting.  Patient {GC/GE:[id number]::"discussed with","seen with"} Dr. {NAMES:[id number]::"Butcher","Guilloud","Hoffman","Mullen","Narendra","Raines","Vincent"}

## 2019-12-02 ENCOUNTER — Encounter: Payer: Medicare Other | Admitting: Internal Medicine

## 2019-12-13 ENCOUNTER — Telehealth: Payer: Self-pay | Admitting: Dietician

## 2019-12-13 NOTE — Telephone Encounter (Signed)
Sherri Gill called for information on how to get the coronavirus vaccine. Information provided over the phone and mailed to her.

## 2020-01-08 ENCOUNTER — Ambulatory Visit: Payer: Medicare HMO | Attending: Internal Medicine

## 2020-01-08 DIAGNOSIS — Z23 Encounter for immunization: Secondary | ICD-10-CM

## 2020-01-08 NOTE — Progress Notes (Signed)
   Covid-19 Vaccination Clinic  Name:  Sherri Gill    MRN: [id number] DOB: [birth year]  01/08/2020  [name] was observed post Covid-19 immunization for 15 minutes without incident. She was provided with Vaccine Information Sheet and instruction to access the V-Safe system.   [name] was instructed to call 911 with any severe reactions post vaccine: [name] Difficulty breathing  . Swelling of face and throat  . A fast heartbeat  . A bad rash all over body  . Dizziness and weakness   Immunizations Administered    Name Date Dose VIS Date Route   Pfizer COVID-19 Vaccine 01/08/2020  9:53 AM 0.3 mL 10/08/2019 Intramuscular   Manufacturer: [place]   Lot: [id number]   [place]: [id number]

## 2020-02-02 ENCOUNTER — Ambulatory Visit: Payer: Medicare HMO | Attending: Internal Medicine

## 2020-02-02 DIAGNOSIS — Z23 Encounter for immunization: Secondary | ICD-10-CM

## 2020-02-02 NOTE — Progress Notes (Signed)
   Covid-19 Vaccination Clinic  Name:  EXODUS MCCREA    MRN: [id number] DOB: 12-13-1950  02/02/2020  Ms. Prichett was observed post Covid-19 immunization for 15 minutes without incident. She was provided with Vaccine Information Sheet and instruction to access the V-Safe system.   Ms. Hawkin was instructed to call 911 with any severe reactions post vaccine: [name] Difficulty breathing  . Swelling of face and throat  . A fast heartbeat  . A bad rash all over body  . Dizziness and weakness   Immunizations Administered    Name Date Dose VIS Date Route   Pfizer COVID-19 Vaccine 02/02/2020 10:39 AM 0.3 mL 10/08/2019 Intramuscular   Manufacturer: [employer]   Lot: [id number]   [place]: [id number]

## 2020-02-09 ENCOUNTER — Ambulatory Visit: Payer: Medicare HMO

## 2020-02-14 ENCOUNTER — Telehealth: Payer: Self-pay

## 2020-02-14 NOTE — Telephone Encounter (Signed)
Received TC from patient, she states she is currently fasting for Ramadan and wants advice on when to take her DM medications.  She took her BS at 3:45 today which was 174 She usually takes her Janumet at 9:00am and again around 8 pm. She wakes up to eat at 4:00 am, will fast from sun-up to sundown, and eats again around 8 pm. Wants to know if she should take the Janumet earlier.  She stopped taking the Victoza a week ago (states she was on 1.2 mg).  She was afraid she would "bottom out" during the day since she was fasting and exercising.  Will forward to PCP to advise. SChaplin, RN,BSN

## 2020-02-17 NOTE — Telephone Encounter (Signed)
Please schedule an acc visit so patient's diabetes regimen can be adjusted based on her diet. Please make sure she brings glucometer to visit.

## 2020-02-18 ENCOUNTER — Telehealth: Payer: Self-pay | Admitting: Internal Medicine

## 2020-02-18 NOTE — Telephone Encounter (Signed)
Pt requesting a call back from the nurse.  °

## 2020-02-18 NOTE — Telephone Encounter (Signed)
Butch Penny, could you please call pt today ASAP she is fasting now for ramadan and needs some advice, she does not want to come in for an appt and we have no appts until mon 4/26  Ph [phone]  Have made pt appt w/ dr Maricela Bo 6/1 at 1515 per HME

## 2020-02-18 NOTE — Telephone Encounter (Signed)
Tried calling this patient. Their voicemail box is not set up. I was unable to leave a message.   Patient answered on second call. She explained she is  fasting sun up to sun down. CBG was 170 last week because she stopped her medicine for the fast. She restarted it , taking Janumet in 8 PM evening and  5:15 morning when she can eat. blood a sugar was 123 today yesterday it was 119 today. I commended her on her diabetes self management she did not think she needed to b seen.

## 2020-02-18 NOTE — Telephone Encounter (Signed)
Patient calling about diabetes meds while fasting for Ramadan. Diabetic Educator is calling patientL. Yuma Gill, BSN, RN-BC

## 2020-03-07 ENCOUNTER — Other Ambulatory Visit: Payer: Self-pay | Admitting: Internal Medicine

## 2020-03-07 DIAGNOSIS — E1140 Type 2 diabetes mellitus with diabetic neuropathy, unspecified: Secondary | ICD-10-CM

## 2020-03-07 DIAGNOSIS — IMO0002 Reserved for concepts with insufficient information to code with codable children: Secondary | ICD-10-CM

## 2020-03-08 ENCOUNTER — Telehealth: Payer: Self-pay | Admitting: Internal Medicine

## 2020-03-08 NOTE — Telephone Encounter (Signed)
Pls contact pt [phone]

## 2020-03-28 ENCOUNTER — Other Ambulatory Visit: Payer: Self-pay

## 2020-03-28 ENCOUNTER — Ambulatory Visit (INDEPENDENT_AMBULATORY_CARE_PROVIDER_SITE_OTHER): Payer: Medicare HMO | Admitting: Internal Medicine

## 2020-03-28 ENCOUNTER — Encounter: Payer: Self-pay | Admitting: Internal Medicine

## 2020-03-28 VITALS — BP 139/62 | HR 86 | Temp 98.3°F | Ht 63.0 in | Wt 201.5 lb

## 2020-03-28 DIAGNOSIS — E1140 Type 2 diabetes mellitus with diabetic neuropathy, unspecified: Secondary | ICD-10-CM | POA: Diagnosis not present

## 2020-03-28 DIAGNOSIS — E1165 Type 2 diabetes mellitus with hyperglycemia: Secondary | ICD-10-CM | POA: Diagnosis not present

## 2020-03-28 DIAGNOSIS — IMO0002 Reserved for concepts with insufficient information to code with codable children: Secondary | ICD-10-CM

## 2020-03-28 DIAGNOSIS — F329 Major depressive disorder, single episode, unspecified: Secondary | ICD-10-CM | POA: Diagnosis not present

## 2020-03-28 DIAGNOSIS — Z7984 Long term (current) use of oral hypoglycemic drugs: Secondary | ICD-10-CM

## 2020-03-28 DIAGNOSIS — R69 Illness, unspecified: Secondary | ICD-10-CM | POA: Diagnosis not present

## 2020-03-28 DIAGNOSIS — F419 Anxiety disorder, unspecified: Secondary | ICD-10-CM | POA: Diagnosis not present

## 2020-03-28 DIAGNOSIS — F411 Generalized anxiety disorder: Secondary | ICD-10-CM

## 2020-03-28 DIAGNOSIS — F331 Major depressive disorder, recurrent, moderate: Secondary | ICD-10-CM

## 2020-03-28 DIAGNOSIS — I10 Essential (primary) hypertension: Secondary | ICD-10-CM | POA: Diagnosis not present

## 2020-03-28 LAB — POCT GLYCOSYLATED HEMOGLOBIN (HGB A1C): Hemoglobin A1C: 7.1 % — AB (ref 4.0–5.6)

## 2020-03-28 MED ORDER — METFORMIN HCL 1000 MG PO TABS: 1000.0000 mg | ORAL_TABLET | Freq: Two times a day (BID) | ORAL | 0 refills | Status: DC

## 2020-03-28 MED ORDER — AMLODIPINE-ATORVASTATIN 5-40 MG PO TABS: 1.0000 | ORAL_TABLET | Freq: Every day | ORAL | 11 refills | Status: DC

## 2020-03-28 MED ORDER — BUPROPION HCL ER (XL) 300 MG PO TB24: 300.0000 mg | ORAL_TABLET | Freq: Every day | ORAL | 0 refills | Status: DC

## 2020-03-28 NOTE — Patient Instructions (Addendum)
It was a pleasure to see you today Ms. Lebleu. Please make the following changes:   Please increase bupropion from 150mg  to 300mg  daily  Continue to taking all of your medication  You have been referred to chronic care management and integrated behavioral health  Please stop taking amlodipine, atorvastatin individually and start taking combination caduet daily Please stop janumet and take metformin 1000mg  twice daily  If you have any questions or concerns, please call our clinic at [phone] between 9am-5pm and after hours call [phone] and ask for the internal medicine resident on call. If you feel you are having a medical emergency please call 911.   Thank you, we look forward to help you remain healthy!  Lars Mage, MD Internal Medicine PGY3   To schedule an appointment for a COVID vaccine or be added to the vaccine wait list: Go to [email or web address]   OR Go to [email or web address]                  OR Call [phone]                                     OR Call [phone] and select Option 2  [facility] ([place]) [address] [place], [place] [address] Hours: Mon,Thu 8-5, Sat 8-12 Type: [place] (12+)  [place]  ([place]) [place] [employer] [place],  [id number] Hours: Thu: 1-5 Type: Pfizer (12+)

## 2020-03-28 NOTE — Progress Notes (Signed)
   CC: Hypertension and diabetes follow up   HPI:  [name] is a 69 y.o. essential htn, diabetes mellitus type 2, iron deficiency who presents for diabetes follow up. Please see problem based charting for evaluation, assessment, and plan.   Past Medical History:  Diagnosis Date  . Chronic anemia 08/29/2012   Need colonoscopy or report. Need iron panel.   . Depression   . Dyslipidemia 06/30/2007  . Essential hypertension, benign 06/30/2007  . GERD 06/30/2007  . Insomnia 06/11/2012  . Obesity, BMI 35-40 06/11/2012  . Peripheral neuropathy 12/28/2009  . Type 2 diabetes mellitus, uncontrolled, with neuropathy ([place]) 06/30/2007  . Viral URI 01/23/2018   Review of Systems:    Denies dyspnea, chest pain, fever, chills  Physical Exam:  Vitals:   03/28/20 1528  BP: 139/62  Pulse: 86  Temp: 98.3 F (36.8 C)  TempSrc: Oral  SpO2: 100%  Weight: 201 lb 8 oz (91.4 kg)  Height: 5\' 3"  (1.6 m)   Physical Exam  Constitutional: She appears well-developed and well-nourished. No distress.  Cardiovascular: Normal rate, regular rhythm and normal heart sounds.  Respiratory: Effort normal and breath sounds normal. No respiratory distress. She has no wheezes.  GI: Soft. Bowel sounds are normal. She exhibits no distension. There is no abdominal tenderness.  Neurological: She is alert.  Skin: She is not diaphoretic.  Psychiatric: Her speech is normal and behavior is normal. Thought content normal. Her mood appears anxious. She exhibits a depressed mood.  Tearful on exam     Assessment & Plan:   See Encounters Tab for problem based charting.  Patient discussed with Dr. Heber [place]

## 2020-03-29 DIAGNOSIS — F419 Anxiety disorder, unspecified: Secondary | ICD-10-CM | POA: Insufficient documentation

## 2020-03-29 LAB — GLUCOSE, CAPILLARY: Glucose-Capillary: 125 mg/dL — ABNORMAL HIGH (ref 70–99)

## 2020-03-29 NOTE — Assessment & Plan Note (Deleted)
The patient's blood pressure during this visit is 139/62. She is currently taking losartan 100mg  qd, hctz 25mg  qd, amlodipine 5mg  qd.   The patient gained 6lbs since previous visit 6 months ago.   Assessment and plan  Blood pressure is relatively well controlled. Should improve with current medication regimen along with lifestyle modifications.

## 2020-03-29 NOTE — Assessment & Plan Note (Signed)
°  Patient states that she has been feeling very depressed. She stats that she has been trying to find a job, but was scared to go out to public places due to the covid pandemic.  The patient states that she feels isolated, has been "worrying about things before they happen, worried about people breaking in". The patient stated that her symptoms worsened after her mother died and she separated from her husband.   She denies SI or HI and states she "wants to live like anybody". However, the symptoms have been interfering with her ability to gain employment.   Assessment and plan  The patient has severe anxiety and depressive symptoms. She is currently on bupropion 150mg  qd ER which she has tolerated well and states that it has helped her sleep.. Although adding an ssri would be ideal, she is worried about the side effect of weight gain. Will therefore increase dose of bupropion to 300mg  qd and will also refer to chronic care management for assistance with employment and integrated behavioral health.

## 2020-03-29 NOTE — Assessment & Plan Note (Signed)
The patient's a1c during this visit is 7.1. The patient is currently taking janumet 50-1000mg  bid and victoza 1.8mg  qd.   Assessment and plan  Will discontinue januvia and continue metformin and victoza. Patient is to continue walking daily. If her blood glucose increases upon this change, then she should be started on an sglt2 inhibitor.

## 2020-03-29 NOTE — Assessment & Plan Note (Addendum)
The patient's blood pressure during this visit is 139/62. She is currently taking losartan 100mg  qd, hctz 25mg  qd, amlodipine 5mg  qd.   The patient gained 6lbs since previous visit 6 months ago.   Assessment and plan  Blood pressure is relatively well controlled. Should improve with current medication regimen along with lifestyle modifications. Combined amlodipine with atorvastatin to decrease pill burden.

## 2020-03-30 ENCOUNTER — Telehealth: Payer: Self-pay | Admitting: Internal Medicine

## 2020-03-30 DIAGNOSIS — F331 Major depressive disorder, recurrent, moderate: Secondary | ICD-10-CM

## 2020-03-30 NOTE — Telephone Encounter (Signed)
Sherri Gill, patient states that she saw Dr. Maricela Bo on 03/28/20 and her b/p med was combined for "convenience sake".  Pt states the new combo b/p pill will cost over $500.  She is requesting the combo b/p medication, amlodipin and atorvastatin, be called in again as separate RX's as she cannot afford $500 for a RX.  Also states her Bupropion was increased from 150 XR to 300mg  and this too will cost "several hundred dollars".  She is requesting to go back down to the 150mg  XR dose.  She states she has started walking in the morning and this has helped.  She also has an appt with A.Hicks, [facility] on 04/05/20.  Will forward request to Dr. Maricela Bo. Thank you, SChaplin, RN,BSN

## 2020-03-30 NOTE — Telephone Encounter (Signed)
$  100+ price of caduet. Can you change to 2 meds or another cheaper combo?

## 2020-03-30 NOTE — Telephone Encounter (Signed)
Pr requesting a phone call back in reference to  Her BP medication.  Pt unable to afford the Combination BP medication as it is too much.   amLODipine-atorvastatin (CADUET) 5-40 MG tablet   [facility] [place], [place] - [id number]

## 2020-03-30 NOTE — Telephone Encounter (Signed)
She can go back to taking the medications separately. If the bupropion at an increased dose is more pricey, ccm can assist with affording the medication. She should definitely follow with A Hicks. I will see her in 2 weeks too. Thank you!

## 2020-03-31 ENCOUNTER — Ambulatory Visit: Payer: Medicare HMO

## 2020-03-31 ENCOUNTER — Ambulatory Visit: Payer: Medicare HMO | Admitting: *Deleted

## 2020-03-31 DIAGNOSIS — IMO0002 Reserved for concepts with insufficient information to code with codable children: Secondary | ICD-10-CM

## 2020-03-31 DIAGNOSIS — E1169 Type 2 diabetes mellitus with other specified complication: Secondary | ICD-10-CM

## 2020-03-31 DIAGNOSIS — I10 Essential (primary) hypertension: Secondary | ICD-10-CM

## 2020-03-31 DIAGNOSIS — E1140 Type 2 diabetes mellitus with diabetic neuropathy, unspecified: Secondary | ICD-10-CM

## 2020-03-31 NOTE — Telephone Encounter (Signed)
Pt aware and has verified that taking the amlodipine and atorvastatin seperately is preferred.  She stated that the Wellbutrin/generic is too expensive--may be able to get it at [facility] for $13 with [facility], will try that.  May have to speak with MD about therapy change due to cost.  I am unaware of any assistance available at this time for Wellbutrin.

## 2020-03-31 NOTE — Chronic Care Management (AMB) (Signed)
Chronic Care Management   Initial Visit Note  03/31/2020 Name: Sherri Gill MRN: [id number] DOB: [birth year]  Referred by: Sherri Mage, MD Reason for referral : Chronic Care Management (DM, HLD, HTN)   Sherri Gill is a 69 y.o. year old female who is a primary care patient of Sherri Gill, [name], MD. The CCM team was consulted for assistance with chronic disease management and care coordination needs related to HTN, HLD, DMII, Anxiety and Depression  Review of patient status, including review of consultants reports, relevant laboratory and other test results, and collaboration with appropriate care team members and the patient's provider was performed as part of comprehensive patient evaluation and provision of chronic care management services.    SDOH (Social Determinants of Health) assessments performed: No See Care Plan activities for detailed interventions related to SDOH     Medications: Outpatient Encounter Medications as of 03/31/2020  Medication Sig  . albuterol (PROVENTIL HFA;VENTOLIN HFA) 108 (90 Base) MCG/ACT inhaler Inhale 2 puffs every 6 (six) hours as needed into the lungs for wheezing or shortness of breath.  [name] amLODipine-atorvastatin (CADUET) 5-40 MG tablet Take 1 tablet by mouth daily.  . Blood Glucose Monitoring Suppl (ONE TOUCH ULTRA MINI) w/Device KIT Please use as directed.  [name] buPROPion (WELLBUTRIN XL) 300 MG 24 hr tablet Take 1 tablet (300 mg total) by mouth daily.  . Cholecalciferol 1000 units capsule Take 1 capsule (1,000 Units total) by mouth daily.  . ferrous gluconate (FERGON) 324 MG tablet Take 1 tablet (324 mg total) by mouth daily.  . fluticasone (FLONASE) 50 MCG/ACT nasal spray Place 2 sprays into both nostrils daily.  [name] glucose blood (ONE TOUCH ULTRA TEST) test strip Use as instructed  . hydrochlorothiazide (HYDRODIURIL) 25 MG tablet TAKE 1 TABLET DAILY WITH LOSARTAN  . Insulin Pen Needle 32G X 4 MM MISC 1 each by Does not apply route daily.  [name] losartan  (COZAAR) 100 MG tablet Take 1 tablet (100 mg total) by mouth daily.  . metFORMIN (GLUCOPHAGE) 1000 MG tablet Take 1 tablet (1,000 mg total) by mouth 2 (two) times daily with a meal.  . ONETOUCH DELICA LANCETS 49I MISC Please use as directed.  [name] VICTOZA 18 MG/3ML SOPN PLEASE START TAKING VICTOZA 0.6MG DAILY WEEK 1, THEN TAKE 1.2MG DAILY WEEK 2, AND THEN TAKE 1.8MG DAILY THEREAFTER   No facility-administered encounter medications on file as of 03/31/2020.     Objective:  Lab Results  Component Value Date   HGBA1C 7.1 (A) 03/28/2020   HGBA1C 7.4 (A) 09/28/2019   HGBA1C 9.0 (A) 06/29/2019   Lab Results  Component Value Date   MICROALBUR 1.4 08/03/2014   LDLCALC 42 08/07/2017   CREATININE 0.96 04/13/2019  needs yearly urine for protein Lab Results  Component Value Date   CHOL 140 08/07/2017   HDL 62 08/07/2017   LDLCALC 42 08/07/2017   TRIG 179 (H) 08/07/2017   CHOLHDL 2.3 08/07/2017  needs yearly lipid panel- on statin therapy  Wt Readings from Last 3 Encounters:  03/28/20 201 lb 8 oz (91.4 kg)  09/28/19 195 lb 3.2 oz (88.5 kg)  08/31/19 203 lb 6.4 oz (92.3 kg)    Goals Addressed            This Visit's Progress     Patient Stated   . "I told the social worker I cannot afford my medications because I don't have a job." (pt-stated)       CARE PLAN ENTRY (see longitudinal plan of care  for additional care plan information)   Current Barriers:  . Chronic Disease Management support, education, and care coordination needs related to HTN, HLD, DMII, Anxiety, and Depression  Case Manager Clinical Goal(s):  [name] Over the next 30 days, patient will work with BSW to address needs related to Financial constraints related to no income because without a job . Medication procurement in patient with HTN, HLD, DMII, Anxiety, and Depression  Interventions:  . Collaborated with BSW to initiate plan of care to address needs related to Financial constraints related to no income because  without a job . Medication procurement in patient with HTN, HLD, DMII, Anxiety, and Depression  Patient Self Care Activities:  . Patient verbalizes understanding of plan to work with chronic care management team and pharmacy technician to address barriers in managing chronic health issues . Self administers medications as prescribed . Attends all scheduled provider appointments . Calls pharmacy for medication refills . Performs ADL's independently . Performs IADL's independently . Calls provider office for new concerns or questions  Initial goal documentation         [name] was given information about Chronic Care Management services today including:  1. CCM service includes personalized support from designated clinical staff supervised by her physician, including individualized plan of care and coordination with other care providers 2. 24/7 contact phone numbers for assistance for urgent and routine care needs. 3. Service will only be billed when office clinical staff spend 20 minutes or more in a month to coordinate care. 4. Only one practitioner may furnish and bill the service in a calendar month. 5. The patient may stop CCM services at any time (effective at the end of the month) by phone call to the office staff. 6. The patient will be responsible for cost sharing (co-pay) of up to 20% of the service fee (after annual deductible is met).  Patient agreed to services and verbal consent obtained.   Plan:   The care management team will reach out to the patient again over the next 30 days.   Kelli Churn RN, CCM, [facility] RN Care Manager [phone]

## 2020-03-31 NOTE — Patient Instructions (Addendum)
Visit Information  Goals Addressed            This Visit's Progress   . "I cannot afford my blood pressure medication" (pt-stated)       Current Barriers:  [name] Knowledge Barriers related to resources and support available to address needs related to Medication procurement  Case Manager Clinical Goal(s):  [name] Over the next 30 days, patient will work with BSW to address needs related to Medication procurement . Over the next 30 days, BSW will collaborate with RN Care Manager to address care management and care coordination needs  Interventions:  . Patient interviewed and appropriate assessments performed . Referral submitted to Pharmacy Technician, Cheryle Horsfall, for medication assistance.  Nash Dimmer with RN Care Manager and patient to establish an individualized plan of care   Patient Self Care Activities:  . Self administers medications as prescribed . Calls provider office for new concerns or questions  Initial goal documentation     . "I need to find employment" (pt-stated)       Current Barriers:  [name] Knowledge Barriers related to resources available to assist with seeking employment.  Patient reports that she worked at [employer] for years but was laid off due to [place] 19 pandemic.  She has been on unemployment since being laid off.  Patient has been trying to find employment but keeps "getting the run around."  Patient states that she has difficulty using computers which makes it harder to find employment/submit application.  Patient did say that she utilizes computers at [employer] and intends to go there today to submit applications.    Case Manager Clinical Goal(s):  [name] Over the next 90 days, patient will work with BSW to address needs related to finding employment.   . Over the next 90 days, BSW will collaborate with RN Care Manager to address care management and care coordination needs  Interventions:  . Patient interviewed and appropriate assessments performed . Educated  patient about indeed.[employer] and provided instruction on how to search jobs and complete applications. . Submitted referral to care guide for community resources that may be available for assisting with employment as well as computer courses.  . Provided patient with contact information for [place] [employer].  [phone] . Collaborated with RN Care Manager and patient to establish an individualized plan of care   Patient Self Care Activities:  . Self administers medications as prescribed . Attends all scheduled provider appointments . Calls pharmacy for medication refills . Calls provider office for new concerns or questions  Initial goal documentation        Patient verbalizes understanding of instructions provided today.   The care management team will reach out to the patient again over the next 14 days.      Ronn Melena, [place] Coordination Social Worker [place] [phone]

## 2020-03-31 NOTE — Chronic Care Management (AMB) (Signed)
Chronic Care Management    Clinical Social Work General Note  03/31/2020 Name: Sherri Gill MRN: [id number] DOB: [birth year]  Sherri Gill is a 69 y.o. year old female who is a primary care patient of [name], [name], MD. The CCM was consulted to assist the patient with medication assistance and seeking employment.   Sherri Gill was given information about Chronic Care Management services today including:  1. CCM service includes personalized support from designated clinical staff supervised by her physician, including individualized plan of care and coordination with other care providers 2. 24/7 contact phone numbers for assistance for urgent and routine care needs. 3. Service will only be billed when office clinical staff spend 20 minutes or more in a month to coordinate care. 4. Only one practitioner may furnish and bill the service in a calendar month. 5. The patient may stop CCM services at any time (effective at the end of the month) by phone call to the office staff. 6. The patient will be responsible for cost sharing (co-pay) of up to 20% of the service fee (after annual deductible is met).  Patient agreed to services and verbal consent obtained.   Review of patient status, including review of consultants reports, relevant laboratory and other test results, and collaboration with appropriate care team members and the patient's provider was performed as part of comprehensive patient evaluation and provision of chronic care management services.    SDOH (Social Determinants of Health) assessments and interventions performed:  Yes    Outpatient Encounter Medications as of 03/31/2020  Medication Sig  . albuterol (PROVENTIL HFA;VENTOLIN HFA) 108 (90 Base) MCG/ACT inhaler Inhale 2 puffs every 6 (six) hours as needed into the lungs for wheezing or shortness of breath.  [name] amLODipine-atorvastatin (CADUET) 5-40 MG tablet Take 1 tablet by mouth daily.  . Blood Glucose Monitoring Suppl (ONE  TOUCH ULTRA MINI) w/Device KIT Please use as directed.  [name] buPROPion (WELLBUTRIN XL) 300 MG 24 hr tablet Take 1 tablet (300 mg total) by mouth daily.  . Cholecalciferol 1000 units capsule Take 1 capsule (1,000 Units total) by mouth daily.  . ferrous gluconate (FERGON) 324 MG tablet Take 1 tablet (324 mg total) by mouth daily.  . fluticasone (FLONASE) 50 MCG/ACT nasal spray Place 2 sprays into both nostrils daily.  [name] glucose blood (ONE TOUCH ULTRA TEST) test strip Use as instructed  . hydrochlorothiazide (HYDRODIURIL) 25 MG tablet TAKE 1 TABLET DAILY WITH LOSARTAN  . Insulin Pen Needle 32G X 4 MM MISC 1 each by Does not apply route daily.  [name] losartan (COZAAR) 100 MG tablet Take 1 tablet (100 mg total) by mouth daily.  . metFORMIN (GLUCOPHAGE) 1000 MG tablet Take 1 tablet (1,000 mg total) by mouth 2 (two) times daily with a meal.  . ONETOUCH DELICA LANCETS 10O MISC Please use as directed.  [name] VICTOZA 18 MG/3ML SOPN PLEASE START TAKING VICTOZA 0.6MG DAILY WEEK 1, THEN TAKE 1.2MG DAILY WEEK 2, AND THEN TAKE 1.8MG DAILY THEREAFTER   No facility-administered encounter medications on file as of 03/31/2020.    Goals Addressed            This Visit's Progress   . "I cannot afford my blood pressure medication" (pt-stated)       Current Barriers:  [name] Knowledge Barriers related to resources and support available to address needs related to Medication procurement  Case Manager Clinical Goal(s):  [name] Over the next 30 days, patient will work with BSW to address needs related  to Medication procurement . Over the next 30 days, BSW will collaborate with RN Care Manager to address care management and care coordination needs  Interventions:  . Patient interviewed and appropriate assessments performed . Referral submitted to Pharmacy Technician, Cheryle Horsfall, for medication assistance.  Nash Dimmer with RN Care Manager and patient to establish an individualized plan of care   Patient Self Care Activities:    . Self administers medications as prescribed . Calls provider office for new concerns or questions  Initial goal documentation     . "I need to find employment" (pt-stated)       Current Barriers:  [name] Knowledge Barriers related to resources available to assist with seeking employment.  Patient reports that she worked at [employer] for years but was laid off due to [place] 19 pandemic.  She has been on unemployment since being laid off.  Patient has been trying to find employment but keeps "getting the run around."  Patient states that she has difficulty using computers which makes it harder to find employment/submit application.  Patient did say that she utilizes computers at [employer] and intends to go there today to submit applications.    Case Manager Clinical Goal(s):  [name] Over the next 90 days, patient will work with BSW to address needs related to finding employment.   . Over the next 90 days, BSW will collaborate with RN Care Manager to address care management and care coordination needs  Interventions:  . Patient interviewed and appropriate assessments performed . Educated patient about indeed[employer] and provided instruction on how to search jobs and complete applications. . Submitted referral to care guide for community resources that may be available for assisting with employment as well as computer courses.  . Provided patient with contact information for [place] [employer].  [phone] . Collaborated with RN Care Manager and patient to establish an individualized plan of care   Patient Self Care Activities:  . Self administers medications as prescribed . Attends all scheduled provider appointments . Calls pharmacy for medication refills . Calls provider office for new concerns or questions  Initial goal documentation         Follow Up Plan: SW will follow up with patient by phone over the next two weeks          Cheetara Hoge [place],  [place] [phone]

## 2020-03-31 NOTE — Addendum Note (Signed)
Addended by: Higinio Roger on: 03/31/2020 09:17 AM   Modules accepted: Orders

## 2020-03-31 NOTE — Telephone Encounter (Signed)
Thank you Dr. Maricela Bo.  I have placed a referral for you to Janet/CCM to assist with medication assistance.  Please send RX's in for the 2 separate meds, amlodipine and atorvastatin, and update the med list. Thanks, SChaplin, RN,BSN

## 2020-03-31 NOTE — Patient Instructions (Signed)
Visit Information  Goals Addressed            This Visit's Progress     Patient Stated   . "I told the social worker I cannot afford my medications because I don't have a job." (pt-stated)       CARE PLAN ENTRY (see longitudinal plan of care for additional care plan information)   Current Barriers:  . Chronic Disease Management support, education, and care coordination needs related to HTN, HLD, DMII, Anxiety, and Depression  Case Manager Clinical Goal(s):  [name] Over the next 30 days, patient will work with BSW to address needs related to Financial constraints related to no income because without a job . Medication procurement in patient with HTN, HLD, DMII, Anxiety, and Depression  Interventions:  . Collaborated with BSW to initiate plan of care to address needs related to Financial constraints related to no income because without a job . Medication procurement in patient with HTN, HLD, DMII, Anxiety, and Depression  Patient Self Care Activities:  . Patient verbalizes understanding of plan to work with chronic care management team and pharmacy technician to address barriers in managing chronic health issues . Self administers medications as prescribed . Attends all scheduled provider appointments . Calls pharmacy for medication refills . Performs ADL's independently . Performs IADL's independently . Calls provider office for new concerns or questions  Initial goal documentation        Ms. Scheerer was given information about Chronic Care Management services today including:  1. CCM service includes personalized support from designated clinical staff supervised by her physician, including individualized plan of care and coordination with other care providers 2. 24/7 contact phone numbers for assistance for urgent and routine care needs. 3. Service will only be billed when office clinical staff spend 20 minutes or more in a month to coordinate care. 4. Only one practitioner may  furnish and bill the service in a calendar month. 5. The patient may stop CCM services at any time (effective at the end of the month) by phone call to the office staff. 6. The patient will be responsible for cost sharing (co-pay) of up to 20% of the service fee (after annual deductible is met).  Patient agreed to services and verbal consent obtained.   The patient verbalized understanding of instructions provided today and declined a print copy of patient instruction materials.   The care management team will reach out to the patient again over the next 30 days.   Kelli Churn RN, CCM, [facility] RN Care Manager [phone]

## 2020-04-03 ENCOUNTER — Ambulatory Visit: Payer: Self-pay

## 2020-04-03 ENCOUNTER — Telehealth: Payer: Medicare HMO

## 2020-04-03 DIAGNOSIS — I10 Essential (primary) hypertension: Secondary | ICD-10-CM

## 2020-04-03 DIAGNOSIS — IMO0002 Reserved for concepts with insufficient information to code with codable children: Secondary | ICD-10-CM

## 2020-04-03 DIAGNOSIS — E1140 Type 2 diabetes mellitus with diabetic neuropathy, unspecified: Secondary | ICD-10-CM

## 2020-04-03 NOTE — Patient Instructions (Signed)
Visit Information  Goals Addressed            This Visit's Progress   . "I need to find employment" (pt-stated)       Current Barriers:  [name] Knowledge Barriers related to resources available to assist with seeking employment.  Patient reports that she worked at [employer] for years but was laid off due to [place] 19 pandemic.  She has been on unemployment since being laid off.  Patient has been trying to find employment but keeps "getting the run around."  Patient states that she has difficulty using computers which makes it harder to find employment/submit application.  Patient did say that she utilizes computers at [employer] and intends to go there today to submit applications.    Case Manager Clinical Goal(s):  [name] Over the next 90 days, patient will work with BSW to address needs related to finding employment.   . Over the next 90 days, BSW will collaborate with RN Care Manager to address care management and care coordination needs  Interventions:  . Received voicemail message from patient asking for return call . Provided patient with contact information for [place] [employer] as she misplaced it after last call.  [phone]  . Inquired if patient attempted to utilize [employer].  Patient has utilized this site and stated that she feels comfortable with how to use it.  . Provided patient with contact information for [employer] and encouraged her to call about possible assistance with seeking eimployment  Patient Self Care Activities:  . Self administers medications as prescribed . Attends all scheduled provider appointments . Calls pharmacy for medication refills . Calls provider office for new concerns or questions  Please see past updates related to this goal by clicking on the "Past Updates" button in the selected goal         Patient verbalizes understanding of instructions provided today.   The patient has been provided with  contact information for the care management team and has been advised to call with any health related questions or concerns.     Ronn Melena, [place] Coordination Social Worker [place] [phone]

## 2020-04-03 NOTE — Chronic Care Management (AMB) (Signed)
  Care Management   Follow Up Note   04/03/2020 Name: Sherri Gill MRN: [id number] DOB: [birth year]  Referred by: Lars Mage, MD Reason for referral : Care Coordination (Finding employment)   Sherri Gill is a 69 y.o. year old female who is a primary care patient of [name], [name], MD. The care management team was consulted for assistance with care management and care coordination needs.    Review of patient status, including review of consultants reports, relevant laboratory and other test results, and collaboration with appropriate care team members and the patient's provider was performed as part of comprehensive patient evaluation and provision of chronic care management services.    SDOH (Social Determinants of Health) assessments performed: No See Care Plan activities for detailed interventions related to [facility])     Advanced Directives: See Care Plan and Vynca application for related entries.   Goals Addressed            This Visit's Progress   . "I need to find employment" (pt-stated)       Current Barriers:  [name] Knowledge Barriers related to resources available to assist with seeking employment.  Patient reports that she worked at [employer] for years but was laid off due to [place] 19 pandemic.  She has been on unemployment since being laid off.  Patient has been trying to find employment but keeps "getting the run around."  Patient states that she has difficulty using computers which makes it harder to find employment/submit application.  Patient did say that she utilizes computers at [employer] and intends to go there today to submit applications.    Case Manager Clinical Goal(s):  [name] Over the next 90 days, patient will work with BSW to address needs related to finding employment.   . Over the next 90 days, BSW will collaborate with RN Care Manager to address care management and care coordination needs  Interventions:  . Received voicemail message from patient asking for  return call . Provided patient with contact information for [place] [employer] as she misplaced it after last call.  [phone]  . Inquired if patient attempted to utilize [employer].  Patient has utilized this site and stated that she feels comfortable with how to use it.  . Provided patient with contact information for [employer] and encouraged her to call about possible assistance with seeking eimployment  Patient Self Care Activities:  . Self administers medications as prescribed . Attends all scheduled provider appointments . Calls pharmacy for medication refills . Calls provider office for new concerns or questions  Please see past updates related to this goal by clicking on the "Past Updates" button in the selected goal          The patient has been provided with contact information for the care management team and has been advised to call with any health related questions or concerns.   Ronn Melena, [place] Coordination Social Worker [place] [phone]

## 2020-04-03 NOTE — Progress Notes (Signed)
Internal Medicine Clinic Attending  Case discussed with Dr. [name] at the time of the visit.  We reviewed the resident's history and exam and pertinent patient test results.  I agree with the assessment, diagnosis, and plan of care documented in the resident's note. 

## 2020-04-04 ENCOUNTER — Telehealth: Payer: Medicare HMO

## 2020-04-05 ENCOUNTER — Encounter: Payer: Self-pay | Admitting: Licensed Clinical Social Worker

## 2020-04-05 ENCOUNTER — Ambulatory Visit: Payer: Medicare HMO | Admitting: Licensed Clinical Social Worker

## 2020-04-05 ENCOUNTER — Ambulatory Visit (INDEPENDENT_AMBULATORY_CARE_PROVIDER_SITE_OTHER): Payer: Medicare HMO | Admitting: Licensed Clinical Social Worker

## 2020-04-05 ENCOUNTER — Other Ambulatory Visit: Payer: Self-pay

## 2020-04-05 DIAGNOSIS — F411 Generalized anxiety disorder: Secondary | ICD-10-CM

## 2020-04-05 DIAGNOSIS — F331 Major depressive disorder, recurrent, moderate: Secondary | ICD-10-CM

## 2020-04-05 NOTE — BH Specialist Note (Signed)
Integrated Behavioral Health Visit via Telemedicine (Telephone)  04/05/2020 Sherri Gill [id number]   Session Start time: 9:35  Session End time: 10:00 Total time: 25 minutes  Referring Provider: Dr. Maricela Bo Type of Visit: Telephonic Patient location: [facility] Provider location: Office All persons participating in visit: Patient and [facility]  Confirmed patient's address: Yes  Confirmed patient's phone number: Yes  Any changes to demographics: No   Discussed confidentiality: Yes    The following statements were read to the patient and/or legal guardian that are established with the [facility] Provider.  "The purpose of this phone visit is to provide behavioral health care while limiting exposure to the coronavirus (COVID19).  There is a possibility of technology failure and discussed alternative modes of communication if that failure occurs."  "By engaging in this telephone visit, you consent to the provision of healthcare.  Additionally, you authorize for your insurance to be billed for the services provided during this telephone visit."   Patient and/or legal guardian consented to telephone visit: Yes   PRESENTING CONCERNS: Patient and/or family reports the following symptoms/concerns: depression, anxiety, grief, and loneliness. Duration of problem: over one year, recurrent; Severity of problem: moderate  STRENGTHS (Protective Factors/Coping Skills): Exercising, self-care.  GOALS ADDRESSED: Patient will: 1.  Reduce symptoms of: anxiety, depression and stress  2.  Increase knowledge and/or ability of: coping skills, healthy habits and stress reduction  3.  Demonstrate ability to: Increase healthy adjustment to current life circumstances, Increase adequate support systems for patient/family and Begin healthy grieving over loss  INTERVENTIONS: Interventions utilized:  Mindfulness or Relaxation Training, Brief CBT and Supportive Counseling Standardized Assessments  completed: assessed for SI, HI, and self-harm.  ASSESSMENT: Patient currently experiencing moderate levels of depression and anxiety. Patient reported that she has started walking more, and she has noticed this is helping her with her sleep. Patient is exercising more, and feels this is helpful. Patient reported she has a decreased appetite. Patient is trying to gain a part time job due to identifying she needs interaction with others. Patient is hopeful that by gaining employment, her loneliness with decrease.   Patient reported negative thoughts, worrying that negative things will happen, and tearfulness.   Patient may benefit from counseling and continued self-care.  PLAN: 1. Follow up with behavioral health clinician on : two weeks.  Dessie Coma, [facility], [place]

## 2020-04-06 ENCOUNTER — Ambulatory Visit: Payer: Self-pay

## 2020-04-06 DIAGNOSIS — E1169 Type 2 diabetes mellitus with other specified complication: Secondary | ICD-10-CM

## 2020-04-06 DIAGNOSIS — IMO0002 Reserved for concepts with insufficient information to code with codable children: Secondary | ICD-10-CM

## 2020-04-06 DIAGNOSIS — E1140 Type 2 diabetes mellitus with diabetic neuropathy, unspecified: Secondary | ICD-10-CM

## 2020-04-06 DIAGNOSIS — I10 Essential (primary) hypertension: Secondary | ICD-10-CM

## 2020-04-06 NOTE — Patient Instructions (Signed)
Visit Information  Goals Addressed              This Visit's Progress   .  "I need to find employment" (pt-stated)        Current Barriers:  [name] Knowledge Barriers related to resources available to assist with seeking employment.  Patient reports that she worked at [employer] for years but was laid off due to [place] 19 pandemic.  She has been on unemployment since being laid off.  Patient has been trying to find employment but keeps "getting the run around."  Patient states that she has difficulty using computers which makes it harder to find employment/submit application.  Patient did say that she utilizes computers at [employer] and intends to go there today to submit applications.    Case Manager Clinical Goal(s):  [name] Over the next 90 days, patient will work with BSW to address needs related to finding employment.   . Over the next 90 days, BSW will collaborate with RN Care Manager to address care management and care coordination needs  Interventions:  . Received call from patient stating that she was contacted by supervisor at [employer] regarding job opportunity.  Patient states that employer needs copy of her social security card and birth certificate.  Patient states that she is not able to locate either documents. . Informed patient that replacement Social Security card will have to be requested through [employer] . Informed patient that replacement copy of birth certificate has to be requested through [place] in county of birth . Encouraged patient to contact HR Department of previous employer, [employer] to determine if they still have these documents on file.    Patient Self Care Activities:  . Self administers medications as prescribed . Attends all scheduled provider appointments . Calls pharmacy for medication refills . Calls provider office for new concerns or questions  Please see past updates related to this goal by clicking on the "Past Updates"  button in the selected goal         Patient verbalizes understanding of instructions provided today.   The patient has been provided with contact information for the care management team and has been advised to call with any health related questions or concerns.    Ronn Melena, [place] Coordination Social Worker [place] [phone]

## 2020-04-06 NOTE — Chronic Care Management (AMB) (Signed)
  Care Management   Follow Up Note   04/06/2020 Name: Sherri Gill MRN: [id number] DOB: 08/01/1951  Referred by: Lars Mage, MD Reason for referral : Care Coordination (Seeking employment)   Sherri Gill is a 69 y.o. year old female who is a primary care patient of [name], [name], MD. The care management team was consulted for assistance with care management and care coordination needs.    Review of patient status, including review of consultants reports, relevant laboratory and other test results, and collaboration with appropriate care team members and the patient's provider was performed as part of comprehensive patient evaluation and provision of chronic care management services.    SDOH (Social Determinants of Health) assessments performed: No See Care Plan activities for detailed interventions related to [facility])     Advanced Directives: See Care Plan and Vynca application for related entries.   Goals Addressed              This Visit's Progress   .  "I need to find employment" (pt-stated)        Current Barriers:  [name] Knowledge Barriers related to resources available to assist with seeking employment.  Patient reports that she worked at [employer] for years but was laid off due to [place] 19 pandemic.  She has been on unemployment since being laid off.  Patient has been trying to find employment but keeps "getting the run around."  Patient states that she has difficulty using computers which makes it harder to find employment/submit application.  Patient did say that she utilizes computers at [employer] and intends to go there today to submit applications.    Case Manager Clinical Goal(s):  [name] Over the next 90 days, patient will work with BSW to address needs related to finding employment.   . Over the next 90 days, BSW will collaborate with RN Care Manager to address care management and care coordination needs  Interventions:  . Received call from patient stating that she  was contacted by supervisor at [employer] regarding job opportunity.  Patient states that employer needs copy of her social security card and birth certificate.  Patient states that she is not able to locate either documents. . Informed patient that replacement Social Security card will have to be requested through [employer] . Informed patient that replacement copy of birth certificate has to be requested through [place] in county of birth . Encouraged patient to contact HR Department of previous employer, [employer] to determine if they still have these documents on file.    Patient Self Care Activities:  . Self administers medications as prescribed . Attends all scheduled provider appointments . Calls pharmacy for medication refills . Calls provider office for new concerns or questions  Please see past updates related to this goal by clicking on the "Past Updates" button in the selected goal          The patient has been provided with contact information for the care management team and has been advised to call with any health related questions or concerns.      Ronn Melena, [place] Coordination Social Worker [place] [phone]

## 2020-04-07 NOTE — Progress Notes (Signed)
Internal Medicine Clinic Resident  I have personally reviewed this encounter including the documentation in this note and/or discussed this patient with the care management provider. I will address any urgent items identified by the care management provider and will communicate my actions to the patient's PCP. I have reviewed the patient's CCM visit with my supervising attending, Dr Hoffman.  Srikar Chiang M Kimberlee Shoun, MD 04/07/2020    

## 2020-04-10 ENCOUNTER — Telehealth: Payer: Medicare HMO

## 2020-04-11 NOTE — Progress Notes (Signed)
Internal Medicine Clinic Resident  I have personally reviewed this encounter including the documentation in this note and/or discussed this patient with the care management provider. I will address any urgent items identified by the care management provider and will communicate my actions to the patient's PCP. I have reviewed the patient's CCM visit with my supervising attending, Dr Heber LaSalle.  Earlene Plater, MD 04/11/2020

## 2020-04-13 ENCOUNTER — Ambulatory Visit: Payer: Self-pay

## 2020-04-13 ENCOUNTER — Encounter: Payer: Medicare HMO | Admitting: Internal Medicine

## 2020-04-13 ENCOUNTER — Telehealth: Payer: Self-pay | Admitting: *Deleted

## 2020-04-13 ENCOUNTER — Telehealth: Payer: Medicare HMO

## 2020-04-13 NOTE — Progress Notes (Deleted)
   CC: ***  HPI:  [name] is a 69 y.o. with essential htn, diabetes mellitus type 2, mdd, iron deficiency anemia who presents for follow up. Please see problem based charting for evaluation, assessment, and plan.  The patient's blood pressure during this visit was ***. At last visit the pateint's amlodipine was combined with atorvastatin to decrease pill burden, but it was too expensive for her. She is currently taking amlodipine 10mg  qd, losartan 100mg  qd . His/her *** last blood pressure visits are  BP Readings from Last 3 Encounters:  03/28/20 139/62  09/28/19 (!) 143/75  06/29/19 139/64    The patient does/does not *** report palpitations, dizziness, chest pain, sob ***.  Assessment and Plan ***  The paitent is currently taking metformin 1000mg  bid, victoza 1.8mg  qd,   Patient with elevated alp thought to be secondary to vitamin d deficiency. She has been taking 1000u daily.   Assessment and plan    Patient is currently taking bupropion 150mg  qd er which was increased to 300mg  qd. She could not afford the pill at an increased dose. Will tell her to take two pills daily. She is to follow up with integrated behavioral health.   Chronic care management helping with securing employment.  Past Medical History:  Diagnosis Date  . Chronic anemia 08/29/2012   Need colonoscopy or report. Need iron panel.   . Depression   . Dyslipidemia 06/30/2007  . Essential hypertension, benign 06/30/2007  . GERD 06/30/2007  . Insomnia 06/11/2012  . Obesity, BMI 35-40 06/11/2012  . Peripheral neuropathy 12/28/2009  . Type 2 diabetes mellitus, uncontrolled, with neuropathy ([place]) 06/30/2007  . Viral URI 01/23/2018   Review of Systems:  ***  Physical Exam:  There were no vitals filed for this visit. ***  Assessment & Plan:   See Encounters Tab for problem based charting.  Patient {GC/GE:3044014::"discussed with","seen with"} Dr.  {NAMES:3044014::"Butcher","Guilloud","Hoffman","Mullen","Narendra","Raines","Vincent"}

## 2020-04-13 NOTE — Telephone Encounter (Signed)
Pt did not come to her appt this morning. Called pt- no answer and vm has not been set up yet, unable to leave a message.

## 2020-04-13 NOTE — Chronic Care Management (AMB) (Signed)
°  Chronic Care Management   Outreach Note  04/13/2020 Name: Sherri Gill MRN: [id number] DOB: 07-Jul-1951  Referred by: Lars Mage, MD Reason for referral : Care Coordination (Medication assistance, Seeking employment)   Patient did not show for scheduled clinic appointment today.  Attempted to contact via phone for follow up but no answer.  Could not leave message due to mailbox being full.    Follow Up Plan: The care management team will reach out to the patient again over the next 10 days.      Ronn Melena, [place] Coordination Social Worker [place] [phone]

## 2020-04-14 ENCOUNTER — Telehealth: Payer: Medicare HMO

## 2020-04-14 NOTE — Progress Notes (Signed)
Internal Medicine Clinic Attending  CCM services provided by the care management provider and their documentation were discussed with Dr. Krienke. We reviewed the pertinent findings, urgent action items addressed by the resident and non-urgent items to be addressed by the PCP.  I agree with the assessment, diagnosis, and plan of care documented in the CCM and resident's note.  Laquitha Heslin C Astrid Vides, DO 04/14/2020  

## 2020-04-14 NOTE — Progress Notes (Signed)
Internal Medicine Clinic Attending  CCM services provided by the care management provider and their documentation were discussed with Dr. Sheppard Coil. We reviewed the pertinent findings, urgent action items addressed by the resident and non-urgent items to be addressed by the PCP.  I agree with the assessment, diagnosis, and plan of care documented in the CCM and resident's note.  Lucious Groves, DO 04/14/2020

## 2020-04-17 ENCOUNTER — Encounter: Payer: Self-pay | Admitting: *Deleted

## 2020-04-19 ENCOUNTER — Ambulatory Visit: Payer: Medicare HMO | Admitting: *Deleted

## 2020-04-19 ENCOUNTER — Telehealth: Payer: Self-pay | Admitting: Internal Medicine

## 2020-04-19 ENCOUNTER — Ambulatory Visit (INDEPENDENT_AMBULATORY_CARE_PROVIDER_SITE_OTHER): Payer: Medicare HMO | Admitting: Licensed Clinical Social Worker

## 2020-04-19 ENCOUNTER — Other Ambulatory Visit: Payer: Self-pay

## 2020-04-19 DIAGNOSIS — I10 Essential (primary) hypertension: Secondary | ICD-10-CM

## 2020-04-19 DIAGNOSIS — F411 Generalized anxiety disorder: Secondary | ICD-10-CM

## 2020-04-19 DIAGNOSIS — E785 Hyperlipidemia, unspecified: Secondary | ICD-10-CM

## 2020-04-19 DIAGNOSIS — E1169 Type 2 diabetes mellitus with other specified complication: Secondary | ICD-10-CM

## 2020-04-19 DIAGNOSIS — IMO0002 Reserved for concepts with insufficient information to code with codable children: Secondary | ICD-10-CM

## 2020-04-19 NOTE — Telephone Encounter (Signed)
Called pt to sch 3 week f/u appt per Dr. Maricela Bo.  The pt is unable to sch an appt at this time due to starting a new job.  Per the pt she will call back an sch an appt.

## 2020-04-19 NOTE — Chronic Care Management (AMB) (Signed)
Chronic Care Management   Initial Visit Note  04/19/2020 Name: Sherri Gill MRN: [id number] DOB: [birth year]  Referred by: [name], MD Reason for referral : Chronic Care Management (DM, HLD, HTN, )   Sherri Gill is a 69 y.o. year old female who is a primary care patient of [name], MD. The CCM team was consulted for assistance with chronic disease management and care coordination needs related to HTN, HLD and DMII  Review of patient status, including review of consultants reports, relevant laboratory and other test results, and collaboration with appropriate care team members and the patient's provider was performed as part of comprehensive patient evaluation and provision of chronic care management services.    SDOH (Social Determinants of Health) assessments performed: No See Care Plan activities for detailed interventions related to SDOH     Medications: Outpatient Encounter Medications as of 04/19/2020  Medication Sig   albuterol (PROVENTIL HFA;VENTOLIN HFA) 108 (90 Base) MCG/ACT inhaler Inhale 2 puffs every 6 (six) hours as needed into the lungs for wheezing or shortness of breath.   amLODipine (NORVASC) 10 MG tablet Take by mouth.   atorvastatin (LIPITOR) 20 MG tablet Take by mouth.   Blood Glucose Monitoring Suppl (ONE TOUCH ULTRA MINI) w/Device KIT Please use as directed.   buPROPion (WELLBUTRIN XL) 300 MG 24 hr tablet Take 1 tablet (300 mg total) by mouth daily.   Cholecalciferol 1000 units capsule Take 1 capsule (1,000 Units total) by mouth daily.   ferrous gluconate (FERGON) 324 MG tablet Take 1 tablet (324 mg total) by mouth daily.   ferrous sulfate 325 (65 FE) MG tablet Take by mouth.   fluticasone (FLONASE) 50 MCG/ACT nasal spray Place 2 sprays into both nostrils daily.   glucose blood (ONE TOUCH ULTRA TEST) test strip Use as instructed   glyBURIDE (DIABETA) 5 MG tablet Take by mouth.   hydrochlorothiazide (HYDRODIURIL) 25 MG  tablet TAKE 1 TABLET DAILY WITH LOSARTAN   hydrochlorothiazide (HYDRODIURIL) 25 MG tablet Take by mouth.   Insulin Pen Needle 32G X 4 MM MISC 1 each by Does not apply route daily.   losartan (COZAAR) 100 MG tablet Take 1 tablet (100 mg total) by mouth daily.   metFORMIN (GLUCOPHAGE) 1000 MG tablet Take 1 tablet (1,000 mg total) by mouth 2 (two) times daily with a meal.   ONETOUCH DELICA LANCETS 13K MISC Please use as directed.   VICTOZA 18 MG/3ML SOPN PLEASE START TAKING VICTOZA 0.6MG DAILY WEEK 1, THEN TAKE 1.2MG DAILY WEEK 2, AND THEN TAKE 1.8MG DAILY THEREAFTER   No facility-administered encounter medications on file as of 04/19/2020.     Objective:  Lab Results  Component Value Date   CHOL 140 08/07/2017   HDL 62 08/07/2017   LDLCALC 42 08/07/2017   TRIG 179 (H) 08/07/2017   CHOLHDL 2.3 08/07/2017   Lab Results  Component Value Date   MICROALBUR 1.4 08/03/2014   MICROALBUR 2.73 (H) 11/18/2008   Please update annual lipid panel and urine for protein  Goals Addressed              This Visit's Progress     Patient Stated     " I would like to get my A1C under 7.0%" (pt-stated)        CARE PLAN ENTRY (see longitudinal plan of care for additional care plan information)  Objective:  Lab Results  Component Value Date   HGBA1C 7.1 (A) 03/28/2020    Lab Results  Component Value Date   CREATININE 0.96 04/13/2019   CREATININE 0.87 03/16/2018   CREATININE 0.78 03/14/2016     No results found for: EGFR  Current Barriers:   Knowledge Deficits related to medications used for management of diabetes- patient states she feels confident with meal planning and exercise and reports good medication taking behavior; says her dose of Victoza remains at 1.2 mg because she is concerned that  she takes so many medications for her diabetes, she does report hat the Victoza helps to curb her appetite  Case Manager Clinical Goal(s):  Over the next 30-60 days, patient will  demonstrate improved adherence to prescribed treatment plan for diabetes self care/management as evidenced by:   daily monitoring and recording of CBG   adherence to ADA/ carb modified diet  exercise at least 5 days/week  adherence to prescribed medication regimen  Discuss increasing dose of Victoza and stopping Glyburide with her provider at the next clinic appointment  Interventions:   Provided education to patient about basic DM disease process  Reviewed medications with patient and discussed importance of medication adherence- discussed cardiovascular benefits of Victoza and usual titration schedule  Discussed plans with patient for ongoing care management follow up and provided patient with direct contact information for care management team  Encouraged patient to schedule provider appointment when she knows her work schedule  Patient Self Care Activities:   UNABLE to independently get Hgb A1C under 7.0%  Initial goal documentation       COMPLETED: "I told the social worker I cannot afford my medications because I don't have a job." (pt-stated)        CARE PLAN ENTRY (see longitudinal plan of care for additional care plan information)   Current Barriers:   Chronic Disease Management support, education, and care coordination needs related to HTN, HLD, DMII, Anxiety, and Depression- patient states she has secured a part time job with [employer]  Case Manager Clinical Goal(s):   Over the next 30 days, patient will work with BSW to address needs related to Financial constraints related to no income because without a job  Medication procurement in patient with HTN, HLD, DMII, Anxiety, and Depression  Interventions:   Collaborated with CCM BSW to advise her patient has secured job  Patient Self Care Activities:   Patient verbalizes understanding of plan to work with chronic care management team and pharmacy technician to address barriers in managing chronic health  issues  Self administers medications as prescribed  Attends all scheduled provider appointments  Calls pharmacy for medication refills  Performs ADL's independently  Performs IADL's independently  Calls provider office for new concerns or questions  Please see past updates related to this goal by clicking on the "Past Updates" button in the selected goal          Ms. Kingsberry was given information about Chronic Care Management services today including:  1. CCM service includes personalized support from designated clinical staff supervised by her physician, including individualized plan of care and coordination with other care providers 2. 24/7 contact phone numbers for assistance for urgent and routine care needs. 3. Service will only be billed when office clinical staff spend 20 minutes or more in a month to coordinate care. 4. Only one practitioner may furnish and bill the service in a calendar month. 5. The patient may stop CCM services at any time (effective at the end of the month) by phone call to the office staff. 6. The patient will be responsible  for cost sharing (co-pay) of up to 20% of the service fee (after annual deductible is met).  Patient agreed to services and verbal consent obtained.   Plan:   The care management team will reach out to the patient again over the next 30-60 days.   Kelli Churn RN, CCM, [facility] RN Care Manager [phone]

## 2020-04-19 NOTE — BH Specialist Note (Signed)
Integrated Behavioral Health Visit via Telemedicine (Telephone)  04/19/2020 KISTA ROBB [id number]   Session Start time: 12:50  Session End time: 1:13 Total time: 23 minutes  Referring Provider: Dr. Maricela Bo Type of Visit: Telephonic Patient location: [facility] [place] Provider location: Office All persons participating in visit: [facility] and patient.  Confirmed patient's address: Yes  Confirmed patient's phone number: Yes  Any changes to demographics: No   Discussed confidentiality: Yes    The following statements were read to the patient and/or legal guardian that are established with the [facility] Provider.  "The purpose of this phone visit is to provide behavioral health care while limiting exposure to the coronavirus (COVID19).  There is a possibility of technology failure and discussed alternative modes of communication if that failure occurs."  "By engaging in this telephone visit, you consent to the provision of healthcare.  Additionally, you authorize for your insurance to be billed for the services provided during this telephone visit."   Patient and/or legal guardian consented to telephone visit: Yes   PRESENTING CONCERNS: Patient and/or family reports the following symptoms/concerns:  issues with sleep, depression, grief, and loneliness.  Duration of problem: over one year, recurrent; Severity of problem: mild  GOALS ADDRESSED: Patient will: 1.  Reduce symptoms of: anxiety, depression and stress  2.  Increase knowledge and/or ability of: coping skills, healthy habits and stress reduction  3.  Demonstrate ability to: Increase healthy adjustment to current life circumstances, Increase adequate support systems for patient/family and Begin healthy grieving over loss  INTERVENTIONS: Interventions utilized:  Motivational Interviewing, Mindfulness or Psychologist, educational, Brief CBT and Supportive Counseling Standardized Assessments completed: Not  Needed  ASSESSMENT: Patient currently experiencing mild levels of depression and anxiety. Patient reported having a difficult time remembering daily tasks sometimes (could be due to anxiety). Patient identified that her mind travels to the worst case scenario.  Patient is continuing to walk, and she is finding this helpful. Patient is about to start a part-time job, and she is hopeful will get her out of the home.  Patient may benefit from counseling.  PLAN: 1. Follow up with behavioral health clinician on : two weeks.   Dessie Coma, [facility], [place]

## 2020-04-19 NOTE — Patient Instructions (Signed)
Visit Information It was nice speaking with you today. Please let me know your preference for follow up calls.  Goals Addressed              This Visit's Progress     Patient Stated   .  " I would like to get my A1C under 7.0%" (pt-stated)        CARE PLAN ENTRY (see longitudinal plan of care for additional care plan information)  Objective:  Lab Results  Component Value Date   HGBA1C 7.1 (A) 03/28/2020 .   Lab Results  Component Value Date   CREATININE 0.96 04/13/2019   CREATININE 0.87 03/16/2018   CREATININE 0.78 03/14/2016 .   [name] No results found for: EGFR  Current Barriers:  [name] Knowledge Deficits related to medications used for management of diabetes- patient states she feels confident with meal planning and exercise and reports good medication taking behavior; says her dose of Victoza remains at 1.2 mg because she is concerned that  she takes so many medications for her diabetes, she does report hat the Victoza helps to curb her appetite  Case Manager Clinical Goal(s):  Over the next 30-60 days, patient will demonstrate improved adherence to prescribed treatment plan for diabetes self care/management as evidenced by:  . daily monitoring and recording of CBG  . adherence to ADA/ carb modified diet . exercise at least 5 days/week . adherence to prescribed medication regimen . Discuss increasing dose of Victoza and stopping Glyburide with her provider at the next clinic appointment  Interventions:  . Provided education to patient about basic DM disease process . Reviewed medications with patient and discussed importance of medication adherence- discussed cardiovascular benefits of Victoza and usual titration schedule . Discussed plans with patient for ongoing care management follow up and provided patient with direct contact information for care management team . Encouraged patient to schedule provider appointment when she knows her work schedule  Patient Self Care  Activities:  . UNABLE to independently get Hgb A1C under 7.0%  Initial goal documentation     .  COMPLETED: "I told the social worker I cannot afford my medications because I don't have a job." (pt-stated)        CARE PLAN ENTRY (see longitudinal plan of care for additional care plan information)   Current Barriers:  . Chronic Disease Management support, education, and care coordination needs related to HTN, HLD, DMII, Anxiety, and Depression- patient states she has secured a part time job with [employer]  Case Manager Clinical Goal(s):  [name] Over the next 30 days, patient will work with BSW to address needs related to Financial constraints related to no income because without a job . Medication procurement in patient with HTN, HLD, DMII, Anxiety, and Depression  Interventions:  . Collaborated with CCM BSW to advise her patient has secured job  Patient Self Care Activities:  . Patient verbalizes understanding of plan to work with chronic care management team and pharmacy technician to address barriers in managing chronic health issues . Self administers medications as prescribed . Attends all scheduled provider appointments . Calls pharmacy for medication refills . Performs ADL's independently . Performs IADL's independently . Calls provider office for new concerns or questions  Please see past updates related to this goal by clicking on the "Past Updates" button in the selected goal         The patient verbalized understanding of instructions provided today and declined a print copy of patient instruction materials.  The care management team will reach out to the patient again over the next 30-60 days.   Kelli Churn RN, CCM, [facility] RN Care Manager [phone]

## 2020-04-21 ENCOUNTER — Ambulatory Visit: Payer: Medicare HMO

## 2020-04-21 DIAGNOSIS — I10 Essential (primary) hypertension: Secondary | ICD-10-CM

## 2020-04-21 DIAGNOSIS — E1169 Type 2 diabetes mellitus with other specified complication: Secondary | ICD-10-CM

## 2020-04-21 DIAGNOSIS — IMO0002 Reserved for concepts with insufficient information to code with codable children: Secondary | ICD-10-CM

## 2020-04-21 DIAGNOSIS — E1140 Type 2 diabetes mellitus with diabetic neuropathy, unspecified: Secondary | ICD-10-CM

## 2020-04-21 NOTE — Patient Instructions (Signed)
Visit Information  Goals Addressed              This Visit's Progress   .  "I need to find employment" (pt-stated)        Current Barriers:  [name] Knowledge Barriers related to resources available to assist with seeking employment.  Patient reports that she worked at [employer] for years but was laid off due to [place] 19 pandemic.  She has been on unemployment since being laid off.  Patient has been trying to find employment but keeps "getting the run around."  Patient states that she has difficulty using computers which makes it harder to find employment/submit application.  Patient did say that she utilizes computers at [employer] and intends to go there today to submit applications.   Update 04/21/20:  Patient has obtained part time employment at [employer] Goal(s):  [name] Over the next 90 days, patient will work with BSW to address needs related to finding employment.   . Over the next 90 days, BSW will collaborate with RN Care Manager to address care management and care coordination needs  Interventions:   . Researched Work Opportunity Tax Credit while on call with patient as she stated she is being asked to complete documentation for this by her employer . Informed patient that, as best I can tell, completion of this document is not required for employment . Recommended several times during call that patient contact Human Resources Department of new employer to inquire about several questions that she has.  Informed patient that I am not equipped to answer these questions that are specific to her employment with [employer]   Patient Self Care Activities:  . Self administers medications as prescribed . Attends all scheduled provider appointments . Calls pharmacy for medication refills . Calls provider office for new concerns or questions  Please see past updates related to this goal by clicking on the "Past Updates" button in the selected goal         Patient  verbalizes understanding of instructions provided today.   The patient has been provided with contact information for the care management team and has been advised to call with any health related questions or concerns.      Ronn Melena, [place] Coordination Social Worker [place] [phone]

## 2020-04-21 NOTE — Progress Notes (Signed)
Internal Medicine Clinic Resident  I have personally reviewed this encounter including the documentation in this note and/or discussed this patient with the care management provider. I will address any urgent items identified by the care management provider and will communicate my actions to the patient's PCP. I have reviewed the patient's CCM visit with my supervising attending, Dr Raines.  Aquil Duhe, MD 04/21/2020    

## 2020-04-21 NOTE — Progress Notes (Signed)
Internal Medicine Clinic Attending  CCM services provided by the care management provider and their documentation were reviewed with Dr. Basaraba.  We reviewed the pertinent findings, urgent action items addressed by the resident and non-urgent items to be addressed by the PCP.  I agree with the assessment, diagnosis, and plan of care documented in the CCM and resident's note.  Arpi Diebold N Tiajuana Leppanen, MD 04/21/2020  

## 2020-04-21 NOTE — Chronic Care Management (AMB) (Signed)
  Care Management   Follow Up Note   04/21/2020 Name: Sherri Gill MRN: [id number] DOB: [birth year]  Referred by: [name], MD Reason for referral : Care Coordination (Employment, Medicaion Assistance)   Sherri Gill is a 69 y.o. year old female who is a primary care patient of [name], MD. The care management team was consulted for assistance with care management and care coordination needs.    Review of patient status, including review of consultants reports, relevant laboratory and other test results, and collaboration with appropriate care team members and the patient's provider was performed as part of comprehensive patient evaluation and provision of chronic care management services.    SDOH (Social Determinants of Health) assessments performed: No See Care Plan activities for detailed interventions related to [facility])     Advanced Directives: See Care Plan and Vyn[employer] application for related entries.   Goals Addressed              This Visit's Progress   .  "I need to find employment" (pt-stated)        Current Barriers:  [name] Knowledge Barriers related to resources available to assist with seeking employment.  Patient reports that she worked at [employer] for years but was laid off due to [place] 19 pandemic.  She has been on unemployment since being laid off.  Patient has been trying to find employment but keeps "getting the run around."  Patient states that she has difficulty using computers which makes it harder to find employment/submit application.  Patient did say that she utilizes computers at [employer] and intends to go there today to submit applications.   Update 04/21/20:  Patient has obtained part time employment at [employer] [place] Goal(s):  [name] Over the next 90 days, patient will work with BSW to address needs related to finding employment.   . Over the next 90 days, BSW will collaborate with RN Care Manager to address care  management and care coordination needs  Interventions:   . Researched Work Opportunity Tax Credit while on call with patient as she stated she is being asked to complete documentation for this by her employer . Informed patient that, as best I can tell, completion of this document is not required for employment . Recommended several times during call that patient contact Human Resources Department of new employer to inquire about several questions that she has.  Informed patient that I am not equipped to answer these questions that are specific to her employment with [employer]   Patient Self Care Activities:  . Self administers medications as prescribed . Attends all scheduled provider appointments . Calls pharmacy for medication refills . Calls provider office for new concerns or questions  Please see past updates related to this goal by clicking on the "Past Updates" button in the selected goal          The patient has been provided with contact information for the care management team and has been advised to call with any health related questions or concerns.    Ronn Melena, [place] Coordination Social Worker [place] [phone]

## 2020-04-24 ENCOUNTER — Telehealth: Payer: Self-pay | Admitting: Internal Medicine

## 2020-04-24 NOTE — Telephone Encounter (Signed)
° °  [facility] 04/24/2020  1st Attempt    Name: Sherri Gill    MRN: [id number]    DOB: 07-Aug-1951    AGE: 69 y.o.    GENDER: female    PCP [name], MD.   Called pt regarding Community Resource Referral for employment assistance. Could not leave voicemail for patient because voicemail box has not been set up. Within the [place] area patient may be able to find employment resources at [employer]. [employer] offers programs and job training to help individuals find employment. [place] phone number [phone]. Will try to contact patient again in a few days.    [place], Care Management Phone: [phone] Email: [email or web address]

## 2020-04-26 NOTE — Telephone Encounter (Signed)
   [facility] 04/26/2020 2nd Attempt   Name: Sherri Gill   MRN: [id number]   DOB: [birth year]   AGE: 69 y.o.   GENDER: female   PCP [name], MD.   Called pt regarding Community Resource Referral for employment assistance. Patient does not have voicemail set up, could not leave message. Will try to contact patient again in a few days.    [place], Care Management Phone: [phone] Email: [email or web address]

## 2020-04-28 NOTE — Telephone Encounter (Signed)
   [facility] 04/28/2020   Name: Sherri Gill   MRN: [id number]   DOB: 09/10/1951   AGE: 69 y.o.   GENDER: female   PCP [name], MD.   Called pt regarding Community Resource Referral for employment assistance. Patient stated that she was at an appointment and that she will give Care Guide a call back.    [place], Care Management Phone: [phone] Email: [email or web address]

## 2020-05-03 NOTE — Telephone Encounter (Signed)
   [facility] 05/03/2020 4th Attempt  Name: Sherri Gill   MRN: [id number]   DOB: [birth year]   AGE: 69 y.o.   GENDER: female   PCP [name], MD.   Fourth attempt to contact patient regarding resources for employment assistance. Patient did not answer. Could not leave voicemail box due to not being set up. [employer] would be a Microbiologist for the patient if she is still in need of assistance. Referral will be closed for unable to contact. Patient will need to contact office if she needs further assistance.   Closing referral pending any other needs of patient.     [place], Care Management Phone: [phone] Email: [email or web address]

## 2020-05-05 ENCOUNTER — Telehealth: Payer: Self-pay | Admitting: *Deleted

## 2020-05-05 ENCOUNTER — Telehealth: Payer: Medicare HMO

## 2020-05-05 ENCOUNTER — Encounter: Payer: Medicare HMO | Admitting: Internal Medicine

## 2020-05-05 ENCOUNTER — Ambulatory Visit (INDEPENDENT_AMBULATORY_CARE_PROVIDER_SITE_OTHER): Payer: Medicare HMO | Admitting: Internal Medicine

## 2020-05-05 ENCOUNTER — Encounter: Payer: Self-pay | Admitting: Internal Medicine

## 2020-05-05 VITALS — BP 146/65 | HR 82 | Temp 98.1°F | Ht 63.0 in

## 2020-05-05 DIAGNOSIS — R69 Illness, unspecified: Secondary | ICD-10-CM | POA: Diagnosis not present

## 2020-05-05 DIAGNOSIS — F418 Other specified anxiety disorders: Secondary | ICD-10-CM

## 2020-05-05 DIAGNOSIS — F331 Major depressive disorder, recurrent, moderate: Secondary | ICD-10-CM | POA: Diagnosis not present

## 2020-05-05 DIAGNOSIS — E1140 Type 2 diabetes mellitus with diabetic neuropathy, unspecified: Secondary | ICD-10-CM

## 2020-05-05 DIAGNOSIS — E1165 Type 2 diabetes mellitus with hyperglycemia: Secondary | ICD-10-CM | POA: Diagnosis not present

## 2020-05-05 DIAGNOSIS — IMO0002 Reserved for concepts with insufficient information to code with codable children: Secondary | ICD-10-CM

## 2020-05-05 MED ORDER — VENLAFAXINE HCL ER 37.5 MG PO CP24: 37.5000 mg | ORAL_CAPSULE | Freq: Every day | ORAL | 3 refills | Status: DC

## 2020-05-05 MED ORDER — BUPROPION HCL ER (XL) 150 MG PO TB24: 150.0000 mg | ORAL_TABLET | Freq: Every day | ORAL | 0 refills | Status: DC

## 2020-05-05 NOTE — Progress Notes (Signed)
CC: 'Feeling overwhelmed'  HPI: [name] is a 70 y.o. with PMH listed below presenting with complaint of 'feeling overwhelmed. Please see problem based assessment and plan for further details.  Past Medical History:  Diagnosis Date  . Chronic anemia 08/29/2012   Need colonoscopy or report. Need iron panel.   . Depression   . Dyslipidemia 06/30/2007  . Essential hypertension, benign 06/30/2007  . GERD 06/30/2007  . Insomnia 06/11/2012  . Obesity, BMI 35-40 06/11/2012  . Peripheral neuropathy 12/28/2009  . Type 2 diabetes mellitus, uncontrolled, with neuropathy ([place]) 06/30/2007  . Viral URI 01/23/2018   Review of Systems: Review of Systems  Constitutional: Negative for chills, fever and malaise/fatigue.  Eyes: Negative for blurred vision.  Respiratory: Negative for shortness of breath.   Cardiovascular: Positive for palpitations. Negative for chest pain and leg swelling.  Gastrointestinal: Negative for constipation, diarrhea, nausea and vomiting.  Neurological: Negative for dizziness, tingling and headaches.  Psychiatric/Behavioral: Positive for depression. Negative for hallucinations, memory loss, substance abuse and suicidal ideas. The patient is nervous/anxious and has insomnia.   All other systems reviewed and are negative.    Physical Exam: Vitals:   05/05/20 1536  BP: (!) 146/65  Pulse: 82  Temp: 98.1 F (36.7 C)  TempSrc: Oral  SpO2: 99%  Height: 5\' 3"  (1.6 m)   Physical Exam Constitutional:      General: She is in acute distress (tearful, agitated).     Appearance: She is obese.  HENT:     Head: Normocephalic and atraumatic.     Mouth/Throat:     Mouth: Mucous membranes are moist.     Pharynx: Oropharynx is clear.  Eyes:     Conjunctiva/sclera: Conjunctivae normal.  Cardiovascular:     Rate and Rhythm: Normal rate and regular rhythm.     Pulses: Normal pulses.     Heart sounds: No murmur heard.   Pulmonary:     Effort: Pulmonary effort is normal.      Breath sounds: Normal breath sounds. No wheezing or rales.  Abdominal:     General: Abdomen is flat. Bowel sounds are normal. There is no distension.     Palpations: Abdomen is soft.     Tenderness: There is no abdominal tenderness.  Musculoskeletal:        General: No swelling. Normal range of motion.  Skin:    General: Skin is warm.  Neurological:     General: No focal deficit present.     Mental Status: She is alert and oriented to person, place, and time.  Psychiatric:        Behavior: Behavior normal.        Thought Content: Thought content normal.     Assessment & Plan:   Type 2 diabetes mellitus, uncontrolled, with neuropathy (HCC) Lab Results  Component Value Date   HGBA1C 7.1 (A) 03/28/2020   A/P Mentions prior hx of uncontrolled diabetes and fear of worsening diabetic control with initiation of venlafaxine. Reassured that as long as she takes her metformin and victoza as prescribed and endorse good adherence to low carb diet, she will maintain her glycemic control.  - C/w metformin, victoza - F/u with PCP  Major depressive disorder, recurrent episode, moderate with anxious distress [facility]) Sherri Gill is a 69 yo F w/ PMH of IDA, T2DM, HTN, GERD and Major Depressive Disorder presenting to [facility] with complaint of feeling hopeless and overwhelmed. She was noted to be tearful. She mentions that she had been living on  disability until recently and started a new part-time job to improve her financial situation. She mentions showing up to work when she was not on schedule and feeling embarrassed and frustrated and feeling like a failure. She also mentions that she is currently in the process of trying to get her son to move in with her and out of his current group home as she feels his schizophrenia meds are being mismanaged and she would feel more comfortable with him at home. She states that all these social stressors are making her feel overwhelmed, anxious, and hopeless. She also  mentions experiencing palpitations in her chest with all these emotional distress. She mentions taking her bupropion at 150mg  dose rather than the 300mg  dose that was prescribed at her last visit with Dr.[name] but feeling as if she has not seen any benefit in her mood. She denies any chest pain, suicidal/homicidal ideations or visual/auditory/tactile hallucinations.  A/P Presenting with anxiety and distress due to recurrent episode of MDD from social stressors. Chart review shows prior PHQ-9 score of 3 while on venlafaxine ER 37.5mg  daily. On this visit, noted to have PHQ-9 of 9 and GAD-7 of 14. When asked why venlafaxine was discontinued, she states she stopped taking it because she felt better. Discussed importance of medication adherence to prevent recurrent episode of MDD. Sherri Gill expressed understanding and is agreeable to restarting venlafaxine  - Start venlafaxine ER 37.5mg  daily - C/w bupropion 150mg  daily - F/u with integrated behavioral health team    Patient discussed with [place], [facility] Pager: [phone]

## 2020-05-05 NOTE — Telephone Encounter (Signed)
Agree with plan for her to be assessed in clinic this afternoon.  THank you for the update.

## 2020-05-05 NOTE — Assessment & Plan Note (Addendum)
Sherri Gill is a 70 yo F w/ PMH of IDA, T2DM, HTN, GERD and Major Depressive Disorder presenting to [facility] with complaint of feeling hopeless and overwhelmed. She was noted to be tearful. She mentions that she had been living on disability until recently and started a new part-time job to improve her financial situation. She mentions showing up to work when she was not on schedule and feeling embarrassed and frustrated and feeling like a failure. She also mentions that she is currently in the process of trying to get her son to move in with her and out of his current group home as she feels his schizophrenia meds are being mismanaged and she would feel more comfortable with him at home. She states that all these social stressors are making her feel overwhelmed, anxious, and hopeless. She also mentions experiencing palpitations in her chest with all these emotional distress. She mentions taking her bupropion at 150mg  dose rather than the 300mg  dose that was prescribed at her last visit with Dr.[name] but feeling as if she has not seen any benefit in her mood. She denies any chest pain, suicidal/homicidal ideations or visual/auditory/tactile hallucinations.  A/P Presenting with anxiety and distress due to recurrent episode of MDD from social stressors. Chart review shows prior PHQ-9 score of 3 while on venlafaxine ER 37.5mg  daily. On this visit, noted to have PHQ-9 of 9 and GAD-7 of 14. When asked why venlafaxine was discontinued, she states she stopped taking it because she felt better. Discussed importance of medication adherence to prevent recurrent episode of MDD. Sherri Gill expressed understanding and is agreeable to restarting venlafaxine  - Start venlafaxine ER 37.5mg  daily - C/w bupropion 150mg  daily - F/u with integrated behavioral health team

## 2020-05-05 NOTE — Assessment & Plan Note (Signed)
Lab Results  Component Value Date   HGBA1C 7.1 (A) 03/28/2020   A/P Mentions prior hx of uncontrolled diabetes and fear of worsening diabetic control with initiation of venlafaxine. Reassured that as long as she takes her metformin and victoza as prescribed and endorse good adherence to low carb diet, she will maintain her glycemic control.  - C/w metformin, victoza - F/u with PCP

## 2020-05-05 NOTE — Telephone Encounter (Signed)
Pt calls and states she has been trying to reach [facility] since after 1300 and couldn't get through, she states this many times during the conversation. Triage nurse apologizes several times for her wait and ask her concerns today, she states she is not doing well on wellbutrin and she is very anxious and worried. She is offered an appt this pm at 1515 and is not sure she will get here because she is shopping with a friend. She was ask if she has thoughts of self harm or harming others and denies both. The appt is still scheduled and she just now calls back to say she will be here. She was cautioned if she cannot make the appt she may go to [facility] all weekend for emergency mental health assist. Sending to yellow team, dr [name], attending

## 2020-05-05 NOTE — Patient Instructions (Addendum)
Thank you for allowing [place] to provide your care today. Today we discussed your anxiety    I have ordered no labs for you. I will call if any are abnormal.    Today we made the following changes to your medications.    Please start venlafaxine XR 37.5mg  daily  Please follow-up in 6 weeks.  Should you have any questions or concerns please call the internal medicine clinic at [phone].     Generalized Anxiety Disorder, Adult Generalized anxiety disorder (GAD) is a mental health disorder. People with this condition constantly worry about everyday events. Unlike normal anxiety, worry related to GAD is not triggered by a specific event. These worries also do not fade or get better with time. GAD interferes with life functions, including relationships, work, and school. GAD can vary from mild to severe. People with severe GAD can have intense waves of anxiety with physical symptoms (panic attacks). What are the causes? The exact cause of GAD is not known. What increases the risk? This condition is more likely to develop in:  Women.  People who have a family history of anxiety disorders.  People who are very shy.  People who experience very stressful life events, such as the death of a loved one.  People who have a very stressful family environment. What are the signs or symptoms? People with GAD often worry excessively about many things in their lives, such as their health and family. They may also be overly concerned about:  Doing well at work.  Being on time.  Natural disasters.  Friendships. Physical symptoms of GAD include:  Fatigue.  Muscle tension or having muscle twitches.  Trembling or feeling shaky.  Being easily startled.  Feeling like your heart is pounding or racing.  Feeling out of breath or like you cannot take a deep breath.  Having trouble falling asleep or staying asleep.  Sweating.  Nausea, diarrhea, or irritable bowel syndrome  (IBS).  Headaches.  Trouble concentrating or remembering facts.  Restlessness.  Irritability. How is this diagnosed? Your health care provider can diagnose GAD based on your symptoms and medical history. You will also have a physical exam. The health care provider will ask specific questions about your symptoms, including how severe they are, when they started, and if they come and go. Your health care provider may ask you about your use of alcohol or drugs, including prescription medicines. Your health care provider may refer you to a mental health specialist for further evaluation. Your health care provider will do a thorough examination and may perform additional tests to rule out other possible causes of your symptoms. To be diagnosed with GAD, a person must have anxiety that:  Is out of his or her control.  Affects several different aspects of his or her life, such as work and relationships.  Causes distress that makes him or her unable to take part in normal activities.  Includes at least three physical symptoms of GAD, such as restlessness, fatigue, trouble concentrating, irritability, muscle tension, or sleep problems. Before your health care provider can confirm a diagnosis of GAD, these symptoms must be present more days than they are not, and they must last for six months or longer. How is this treated? The following therapies are usually used to treat GAD:  Medicine. Antidepressant medicine is usually prescribed for long-term daily control. Antianxiety medicines may be added in severe cases, especially when panic attacks occur.  Talk therapy (psychotherapy). Certain types of talk therapy can be  helpful in treating GAD by providing support, education, and guidance. Options include: ? Cognitive behavioral therapy (CBT). People learn coping skills and techniques to ease their anxiety. They learn to identify unrealistic or negative thoughts and behaviors and to replace them with  positive ones. ? Acceptance and commitment therapy (ACT). This treatment teaches people how to be mindful as a way to cope with unwanted thoughts and feelings. ? Biofeedback. This process trains you to manage your body's response (physiological response) through breathing techniques and relaxation methods. You will work with a therapist while machines are used to monitor your physical symptoms.  Stress management techniques. These include yoga, meditation, and exercise. A mental health specialist can help determine which treatment is best for you. Some people see improvement with one type of therapy. However, other people require a combination of therapies. Follow these instructions at home:  Take over-the-counter and prescription medicines only as told by your health care provider.  Try to maintain a normal routine.  Try to anticipate stressful situations and allow extra time to manage them.  Practice any stress management or self-calming techniques as taught by your health care provider.  Do not punish yourself for setbacks or for not making progress.  Try to recognize your accomplishments, even if they are small.  Keep all follow-up visits as told by your health care provider. This is important. Contact a health care provider if:  Your symptoms do not get better.  Your symptoms get worse.  You have signs of depression, such as: ? A persistently sad, cranky, or irritable mood. ? Loss of enjoyment in activities that used to bring you joy. ? Change in weight or eating. ? Changes in sleeping habits. ? Avoiding friends or family members. ? Loss of energy for normal tasks. ? Feelings of guilt or worthlessness. Get help right away if:  You have serious thoughts about hurting yourself or others. If you ever feel like you may hurt yourself or others, or have thoughts about taking your own life, get help right away. You can go to your nearest emergency department or call:  Your local  emergency services (911 in the [place]  A suicide crisis helpline, such as the [facility] at (937)289-3258. This is open 24 hours a day. Summary  Generalized anxiety disorder (GAD) is a mental health disorder that involves worry that is not triggered by a specific event.  People with GAD often worry excessively about many things in their lives, such as their health and family.  GAD may cause physical symptoms such as restlessness, trouble concentrating, sleep problems, frequent sweating, nausea, diarrhea, headaches, and trembling or muscle twitching.  A mental health specialist can help determine which treatment is best for you. Some people see improvement with one type of therapy. However, other people require a combination of therapies. This information is not intended to replace advice given to you by your health care provider. Make sure you discuss any questions you have with your health care provider. Document Revised: 09/26/2017 Document Reviewed: 09/03/2016 Elsevier Patient Education  2020 [employer].

## 2020-05-06 ENCOUNTER — Other Ambulatory Visit: Payer: Self-pay | Admitting: Internal Medicine

## 2020-05-06 DIAGNOSIS — IMO0002 Reserved for concepts with insufficient information to code with codable children: Secondary | ICD-10-CM

## 2020-05-06 DIAGNOSIS — I10 Essential (primary) hypertension: Secondary | ICD-10-CM

## 2020-05-08 ENCOUNTER — Telehealth: Payer: Self-pay | Admitting: Internal Medicine

## 2020-05-08 NOTE — Progress Notes (Signed)
Internal Medicine Clinic Attending  Case discussed with Dr. Truman Hayward  At the time of the visit.  We reviewed the resident's history and exam and pertinent patient test results.  I agree with the assessment, diagnosis, and plan of care documented in the resident's note.  I look forward to meeting her next week in my continuity clinic.

## 2020-05-08 NOTE — Telephone Encounter (Signed)
[name], can these wait until I see her THursday?

## 2020-05-08 NOTE — Telephone Encounter (Signed)
Sherri Gill, may we hold off until our visit in clinic this week?

## 2020-05-08 NOTE — Chronic Care Management (AMB) (Signed)
  Care Management   Note  05/08/2020 Name: Sherri Gill MRN: [id number] DOB: 06-Apr-1951  Sherri Gill is a 69 y.o. year old female who is a primary care patient of [name], MD and is actively engaged with the care management team. I reached out to [name] by phone today to assist with re-scheduling a follow up visit with the BSW  Follow up plan: Unsuccessful telephone outreach attempt made.  The care management team will reach out to the patient again over the next 7 days.  If patient returns call to provider office, please advise to call [facility]  at [place], [place], [place], [place] [address] Direct Dial: [phone] [email or web address]: [email or web address]

## 2020-05-09 ENCOUNTER — Ambulatory Visit: Payer: Medicare HMO | Admitting: Licensed Clinical Social Worker

## 2020-05-09 ENCOUNTER — Telehealth: Payer: Self-pay | Admitting: Licensed Clinical Social Worker

## 2020-05-09 NOTE — Chronic Care Management (AMB) (Signed)
  Care Management   Note  05/09/2020 Name: Sherri Gill MRN: [id number] DOB: 03/12/1951  Sherri Gill is a 69 y.o. year old female who is a primary care patient of [name], MD and is actively engaged with the care management team. I reached out to [name] by phone today to assist with re-scheduling a follow up visit with the BSW  Follow up plan: Unsuccessful telephone outreach attempt made. A HIPPA compliant phone message was left for the patient providing contact information and requesting a return call.  The care management team will reach out to the patient again over the next 2 days.  If patient returns call to provider office, please advise to call [facility]  at [place], [place], [place], [place] [address] Direct Dial: [phone] [email or web address] Website: [email or web address]

## 2020-05-09 NOTE — Telephone Encounter (Signed)
Patient was called for her scheduled visit. Patient reported she was at work, and she could not complete the appointment. Patient asked to be r/s to a Thursday. I told the patient the days of the week that I am currently in the office. The patient was also informed that if our schedules do not aline, I could always refer her to another provider.

## 2020-05-10 ENCOUNTER — Encounter: Payer: Self-pay | Admitting: Internal Medicine

## 2020-05-11 ENCOUNTER — Encounter: Payer: Self-pay | Admitting: Internal Medicine

## 2020-05-11 ENCOUNTER — Ambulatory Visit: Payer: Medicare HMO | Admitting: Internal Medicine

## 2020-05-11 VITALS — BP 129/63 | HR 73 | Temp 98.3°F | Ht 63.0 in | Wt 196.2 lb

## 2020-05-11 DIAGNOSIS — Z1231 Encounter for screening mammogram for malignant neoplasm of breast: Secondary | ICD-10-CM

## 2020-05-11 DIAGNOSIS — E1169 Type 2 diabetes mellitus with other specified complication: Secondary | ICD-10-CM

## 2020-05-11 DIAGNOSIS — F331 Major depressive disorder, recurrent, moderate: Secondary | ICD-10-CM

## 2020-05-11 DIAGNOSIS — E1149 Type 2 diabetes mellitus with other diabetic neurological complication: Secondary | ICD-10-CM

## 2020-05-12 ENCOUNTER — Other Ambulatory Visit: Payer: Self-pay | Admitting: Internal Medicine

## 2020-05-12 ENCOUNTER — Telehealth: Payer: Medicare HMO

## 2020-05-13 ENCOUNTER — Encounter: Payer: Self-pay | Admitting: Internal Medicine

## 2020-05-13 NOTE — Assessment & Plan Note (Signed)
Has been 3 years since last lipid labs, will schedule for 07/17/2020.

## 2020-05-13 NOTE — Progress Notes (Signed)
Established Patient Office Visit  Subjective:  Patient ID: Sherri Gill, female    DOB: [birth year]  Age: 69 y.o. MRN: [id number]  CC:  Chief Complaint  Patient presents with  . Follow-up    HPI Sherri Gill presents for f/u of her recent severe anxiety and depressed mood for which she was seen as an acute visit on 05/02/20 by Dr. Truman Hayward, at which time she was agreeable to restarting her Effexor (previously stopped months ago due to concern that it may have been contributing to severe insomnia).  She has remained on the bupropion, which was added during the stress of the pandemic.  She states that today she is doing much better, though desires help with persistent symptoms.  Notably, she has been trying to walk for exercise and has lost a few pounds, about which she is feeling very positive.     Past Medical History:  Diagnosis Date  . Chronic anemia 08/29/2012   Need colonoscopy or report. Need iron panel.   . Depression   . Dyslipidemia 06/30/2007  . Essential hypertension, benign 06/30/2007  . GERD 06/30/2007  . Hx of Herpes simplex meningitis 2015   Also noted to have primary empty sella on imaging at this admission  . Insomnia 06/11/2012  . Obesity, BMI 35-40 06/11/2012  . Peripheral neuropathy 2/2 T2DM 12/28/2009  . Primary empty sella syndrome ([place]) 2015   Noted on imaging during hospitalization for herpes meningitis; no pituitary mass, no hormone w/u at that time, hormonally asymptomatic  . Type 2 diabetes mellitus with neurological complications ([place]) 33/35/4562    Past Surgical History:  Procedure Laterality Date  . COLONOSCOPY W/ POLYPECTOMY  9/05  . TUBAL LIGATION      Family History  Problem Relation Age of Onset  . Dementia Mother     Social History   Socioeconomic History  . Marital status: Single    Spouse name: Not on file  . Number of children: Not on file  . Years of education: 37  . Highest education level: Not on file  Occupational History  .  Occupation: Scientist, clinical (histocompatibility and immunogenetics): [employer]  Tobacco Use  . Smoking status: Former Smoker    Quit date: 10/28/1978    Years since quitting: 41.5  . Smokeless tobacco: Never Used  Substance and Sexual Activity  . Alcohol use: No    Alcohol/week: 0.0 standard drinks  . Drug use: No  . Sexual activity: Yes    Birth control/protection: Condom  Other Topics Concern  . Not on file  Social History Narrative   Lost employment at [employer] due to pandemic; received government pandemic disability check, has recently begun a new parttime job.  Her schizophrenic son has recently moved back in with her so that she can supervise his medications (formerly living in a group home).               Social Determinants of Health   Financial Resource Strain: Medium Risk  . Difficulty of Paying Living Expenses: Somewhat hard  Food Insecurity: No Food Insecurity  . Worried About Charity fundraiser in the Last Year: Never true  . Ran Out of Food in the Last Year: Never true  Transportation Needs: No Transportation Needs  . Lack of Transportation (Medical): No  . Lack of Transportation (Non-Medical): No  Physical Activity:   . Days of Exercise per Week:   . Minutes of Exercise per Session:   Stress:   . Feeling of  Stress : significant                                      Outpatient Medications Prior to Visit  Medication Sig Dispense Refill  . albuterol (PROVENTIL HFA;VENTOLIN HFA) 108 (90 Base) MCG/ACT inhaler Inhale 2 puffs every 6 (six) hours as needed into the lungs for wheezing or shortness of breath. 1 Inhaler 0  . amLODipine (NORVASC) 10 MG tablet Take by mouth.    [name] atorvastatin (LIPITOR) 20 MG tablet Take by mouth.    . Blood Glucose Monitoring Suppl (ONE TOUCH ULTRA MINI) w/Device KIT Please use as directed. 1 each 0  . buPROPion (WELLBUTRIN XL) 150 MG 24 hr tablet Take 1 tablet (150 mg total) by mouth daily. 90 tablet 0  . Cholecalciferol 1000 units capsule Take 1 capsule (1,000 Units  total) by mouth daily. 90 capsule 1  . ferrous gluconate (FERGON) 324 MG tablet Take 1 tablet (324 mg total) by mouth daily. 90 tablet 1  . ferrous sulfate 325 (65 FE) MG tablet Take by mouth.    . fluticasone (FLONASE) 50 MCG/ACT nasal spray Place 2 sprays into both nostrils daily. 1 g 2  . glucose blood (ONE TOUCH ULTRA TEST) test strip Use as instructed 100 each 1  . glyBURIDE (DIABETA) 5 MG tablet Take by mouth.    . hydrochlorothiazide (HYDRODIURIL) 25 MG tablet TAKE 1 TABLET DAILY WITH LOSARTAN 90 tablet 1  . hydrochlorothiazide (HYDRODIURIL) 25 MG tablet Take by mouth.    . Insulin Pen Needle 32G X 4 MM MISC 1 each by Does not apply route daily. 100 each 3  . metFORMIN (GLUCOPHAGE) 1000 MG tablet Take 1 tablet (1,000 mg total) by mouth 2 (two) times daily with a meal. 90 tablet 0  . ONETOUCH DELICA LANCETS 65K MISC Please use as directed. 100 each 0  . venlafaxine XR (EFFEXOR-XR) 37.5 MG 24 hr capsule Take 1 capsule (37.5 mg total) by mouth daily with breakfast. 30 capsule 3  . VICTOZA 18 MG/3ML SOPN PLEASE START TAKING VICTOZA 0.6MG DAILY WEEK 1, THEN TAKE 1.2MG DAILY WEEK 2, AND THEN TAKE 1.8MG DAILY THEREAFTER 9 mL 1   No facility-administered medications prior to visit.    Allergies  Allergen Reactions  . Codeine Sulfate Nausea And Vomiting and Other (See Comments)    Dizziness  . Penicillins Nausea And Vomiting  . Percocet [Oxycodone-Acetaminophen] Nausea And Vomiting  . Codeine Diarrhea  . Lisinopril Cough    ROS Review of Systems  Eyes: Positive for visual disturbance.  Respiratory: Positive for chest tightness and shortness of breath.   Psychiatric/Behavioral: Positive for agitation, dysphoric mood and sleep disturbance. Negative for behavioral problems, confusion, hallucinations and suicidal ideas. The patient is nervous/anxious. The patient is not hyperactive.    Note that the chest tightness and shortness of breath above only occur during episodes of anxiety and  panic, not during physical exertion.   Objective:    Physical Exam  BP 129/63 (BP Location: Right Arm, Patient Position: Sitting, Cuff Size: Small)   Pulse 73   Temp 98.3 F (36.8 C) (Oral)   Ht _0  (1.6 m)   Wt 196 lb 3.2 oz (89 kg)   SpO2 100%   BMI 34.76 kg/m  Wt Readings from Last 3 Encounters:  05/11/20 196 lb 3.2 oz (89 kg)  03/28/20 201 lb 8 oz (91.4 kg)  09/28/19 195  lb 3.2 oz (88.5 kg)   Ms. Chokshi was nicely dressed and groomed today, calm and pleasant in interaction, focusing easily, describing her symptoms in a logical coherent fashion, and exhibiting no anxiety or dysphoria .  She frequently motioned to her sternal area when describing her episodes of intense anxiety, indicating the area where she often feels discomfort.  Additional physical exam will be conducted at next visit, as today was predominantly a follow-up visit of mood.  Health Maintenance Due  Topic Date Due  . Hepatitis C Screening  Never done (planned for 06/2020)  . TETANUS/TDAP  Never done  . COLONOSCOPY  Never done  . MAMMOGRAM  04/10/2013 (order placed today)  . OPHTHALMOLOGY EXAM  02/03/2015 (will order)  . DEXA SCAN  Never done  . PNA vac Low Risk Adult (1 of 2 - PCV13) 06/05/2018  . FOOT EXAM  04/12/2020 plan for 07/17/2020)    There are no preventive care reminders to display for this patient.   Lab Results  Component Value Date   WBC 9.4 03/16/2018   HGB 10.4 (L) 03/16/2018   HCT 32.3 (L) 03/16/2018   MCV 78 (L) 03/16/2018   PLT 404 03/16/2018   Lab Results  Component Value Date   NA 139 04/13/2019   K 4.2 04/13/2019   CO2 26 04/13/2019   GLUCOSE 131 (H) 04/13/2019   BUN 19 04/13/2019   CREATININE 0.96 04/13/2019   BILITOT 0.3 04/13/2019   ALKPHOS 172 (H) 04/13/2019   AST 15 04/13/2019   ALT 17 04/13/2019   PROT 6.8 04/13/2019   ALBUMIN 4.2 04/13/2019   CALCIUM 10.2 04/13/2019   ANIONGAP 13 06/30/2014   Lab Results  Component Value Date   CHOL 140 08/07/2017   Lab  Results  Component Value Date   HDL 62 08/07/2017   Lab Results  Component Value Date   LDLCALC 42 08/07/2017   Lab Results  Component Value Date   TRIG 179 (H) 08/07/2017   Lab Results  Component Value Date   CHOLHDL 2.3 08/07/2017   Lab Results  Component Value Date   HGBA1C 7.1 (A) 03/28/2020      Assessment & Plan:    See problem based documentation.  At next visit, we will review chronic disease management.  Type 2 diabetes mellitus is currently under good control,  as is her blood pressure.  She is due for a number of labs as well as some health maintenance interventions, and today mammogram order will be placed as well as referral to eye specialist.  Labs to be done 06/2020 include A1c, CMP, vit D, TSH, Hep C screening, lipids.      Problem List Items Addressed This Visit    None    Visit Diagnoses    Breast cancer screening by mammogram    -  Primary   Relevant Orders   MM DIGITAL SCREENING BILATERAL      No orders of the defined types were placed in this encounter.   Follow-up: No follow-ups on file.    [name], MD

## 2020-05-13 NOTE — Assessment & Plan Note (Signed)
Sherri Gill today has a stable mood and is grateful to have been able to meet with Dr. Truman Hayward last week and again today to describe her stressful life situation.  She has been negatively impacted by the social isolation and lost her job due to the pandemic, followed recently by bringing her schizophrenic adult son home to live with her again.  She has recently begun a part-time job, and feels significant pressure to successfully manage.  She indeed has been experiencing panic attacks.  Current therapy is appropriate, though she realizes that an increased dose in Effexor may be necessary to control her symptoms (though it may also exacerbate insomnia, at which time we could change to a different medication such as citalopram.).

## 2020-05-13 NOTE — Assessment & Plan Note (Signed)
Under excellent control as of 03/2020, due for A1c 06/2020. She mentions that prior authorization for Victoza may not have been completed yet, and requests follow-up.

## 2020-05-15 NOTE — Chronic Care Management (AMB) (Signed)
  Care Management   Note  05/15/2020 Name: Sherri Gill MRN: [id number] DOB: 1951/01/15  Sherri Gill is a 69 y.o. year old female who is a primary care patient of [name], MD and is actively engaged with the care management team. I reached out to [name] by phone today to assist with re-scheduling a follow up visit with the BSW  Follow up plan: Telephone appointment with care management team member scheduled for:05/19/2020  Noreene Larsson, [place], [place], [place] [address] Direct Dial: [phone] [email or web address]

## 2020-05-18 ENCOUNTER — Ambulatory Visit: Payer: Self-pay

## 2020-05-18 NOTE — Chronic Care Management (AMB) (Signed)
Encounter opened in error

## 2020-05-19 ENCOUNTER — Telehealth: Payer: Self-pay | Admitting: *Deleted

## 2020-05-19 ENCOUNTER — Telehealth: Payer: Medicare HMO

## 2020-05-19 ENCOUNTER — Ambulatory Visit: Payer: Medicare HMO | Admitting: *Deleted

## 2020-05-19 DIAGNOSIS — E1149 Type 2 diabetes mellitus with other diabetic neurological complication: Secondary | ICD-10-CM

## 2020-05-19 DIAGNOSIS — F331 Major depressive disorder, recurrent, moderate: Secondary | ICD-10-CM

## 2020-05-19 DIAGNOSIS — E1169 Type 2 diabetes mellitus with other specified complication: Secondary | ICD-10-CM

## 2020-05-19 NOTE — Telephone Encounter (Signed)
Patient has been unable to receive her Victoza as it requires a PA. Will forward to PA staff. Hubbard Hartshorn, BSN, RN-BC

## 2020-05-19 NOTE — Patient Instructions (Signed)
Visit Information I have notified Dr [name] of your request for refills on your VICTOZA and Metformin. Goals Addressed              This Visit's Progress     Patient Stated   .  " I would like to get my A1C under 7.0%" (pt-stated)        CARE PLAN ENTRY (see longitudinal plan of care for additional care plan information)  Objective:  Lab Results  Component Value Date   HGBA1C 7.1 (A) 03/28/2020   HGBA1C 7.4 (A) 09/28/2019   HGBA1C 9.0 (A) 06/29/2019   Lab Results  Component Value Date   MICROALBUR 1.4 08/03/2014   LDLCALC 42 08/07/2017   CREATININE 0.96 04/13/2019     Current Barriers:  [name] Knowledge Deficits related to medications used for management of diabetes- patient called this CCM RN stating she is almost out of her diabetes medications and that she saw a clinic provider on 05/11/20 , the Glyburide was stopped  and she was told her medications would be refilled, she also says she would like help with the Victoza copay if possible  Case Manager Clinical Goal(s):  Over the next 30-60 days, patient will demonstrate improved adherence to prescribed treatment plan for diabetes self care/management as evidenced by:  . daily monitoring and recording of CBG  . adherence to ADA/ carb modified diet . exercise at least 5 days/week . adherence to prescribed medication regimen  Interventions:  . Paged Dr [name] and told her patient needs refills on Metformin and Victoza . Collaborated with pharmacy tech Harolyn Rutherford requesting patient assistance with Victoza . Discussed plans with patient for ongoing care management follow up and provided patient with direct contact information for care management team . Encouraged patient to schedule provider appointment when she knows her work schedule  Patient Self Care Activities:  . UNABLE to independently get Hgb A1C under 7.0%  Please see past updates related to this goal by clicking on the "Past Updates" button in the selected goal          The patient verbalized understanding of instructions provided today and declined a print copy of patient instruction materials.   The care management team will reach out to the patient again over the next 30-60 days.   Kelli Churn RN, CCM, [facility] RN Care Manager [phone]

## 2020-05-19 NOTE — Telephone Encounter (Signed)
The patient tells me she will run out of Victoza over the weekend. How quickly does it take to get a PA? Marcie Bal

## 2020-05-19 NOTE — Telephone Encounter (Signed)
Called Sherri Gill who replied that she didn't need prior auth for the Victoza, but rather for the metformin.  I called pharmacy again - they don't need prior auth for metformin, but needed a new prescription which was given by phone.  I called Sherri Gill to let her know that both the metformin and Victoza were ready to pick up at the pharmacy.

## 2020-05-19 NOTE — Telephone Encounter (Signed)
I called Sherri Gill where it was originally prescribed.  The prescription had been transferred to [facility] on [address], which I called.      Sherri Gill is able to pick up the prescription refill anytime which had already been authorized. I will call and tell her now.

## 2020-05-19 NOTE — Chronic Care Management (AMB) (Signed)
Chronic Care Management   Follow Up Note   05/19/2020 Name: Sherri Gill MRN: [id number] DOB: 06-07-1951  Referred by: [name], MD Reason for referral : Chronic Care Management (DM, HTN. HLD)   Sherri Gill is a 69 y.o. year old female who is a primary care patient of [name], MD. The CCM team was consulted for assistance with chronic disease management and care coordination needs.    Review of patient status, including review of consultants reports, relevant laboratory and other test results, and collaboration with appropriate care team members and the patient's provider was performed as part of comprehensive patient evaluation and provision of chronic care management services.    SDOH (Social Determinants of Health) assessments performed: No See Care Plan activities for detailed interventions related to [facility])     Outpatient Encounter Medications as of 05/19/2020  Medication Sig Note  . albuterol (PROVENTIL HFA;VENTOLIN HFA) 108 (90 Base) MCG/ACT inhaler Inhale 2 puffs every 6 (six) hours as needed into the lungs for wheezing or shortness of breath.   [name] amLODipine (NORVASC) 10 MG tablet Take by mouth.   [name] atorvastatin (LIPITOR) 20 MG tablet Take by mouth.   . Blood Glucose Monitoring Suppl (ONE TOUCH ULTRA MINI) w/Device KIT Please use as directed.   [name] buPROPion (WELLBUTRIN XL) 150 MG 24 hr tablet Take 1 tablet (150 mg total) by mouth daily.   . Cholecalciferol 1000 units capsule Take 1 capsule (1,000 Units total) by mouth daily.   . ferrous gluconate (FERGON) 324 MG tablet Take 1 tablet (324 mg total) by mouth daily.   . ferrous sulfate 325 (65 FE) MG tablet Take by mouth.   . fluticasone (FLONASE) 50 MCG/ACT nasal spray Place 2 sprays into both nostrils daily.   [name] glucose blood (ONE TOUCH ULTRA TEST) test strip Use as instructed   . glyBURIDE (DIABETA) 5 MG tablet Take by mouth. (Patient not taking: Reported on 05/19/2020) 05/19/2020: Patient states she  is no longer taking Glyburide  . hydrochlorothiazide (HYDRODIURIL) 25 MG tablet TAKE 1 TABLET DAILY WITH LOSARTAN   . hydrochlorothiazide (HYDRODIURIL) 25 MG tablet Take by mouth.   . Insulin Pen Needle 32G X 4 MM MISC 1 each by Does not apply route daily.   [name] losartan (COZAAR) 100 MG tablet Take 1 tablet by mouth once daily   . metFORMIN (GLUCOPHAGE) 1000 MG tablet Take 1 tablet (1,000 mg total) by mouth 2 (two) times daily with a meal.   . ONETOUCH DELICA LANCETS 97X MISC Please use as directed.   . venlafaxine XR (EFFEXOR-XR) 37.5 MG 24 hr capsule Take 1 capsule (37.5 mg total) by mouth daily with breakfast.   . VICTOZA 18 MG/3ML SOPN PLEASE START TAKING VICTOZA 0.6MG DAILY WEEK 1, THEN TAKE 1.2MG DAILY WEEK 2, AND THEN TAKE 1.8MG DAILY THEREAFTER    No facility-administered encounter medications on file as of 05/19/2020.     Objective:  Wt Readings from Last 3 Encounters:  05/11/20 196 lb 3.2 oz (89 kg)  03/28/20 201 lb 8 oz (91.4 kg)  09/28/19 195 lb 3.2 oz (88.5 kg)   BP Readings from Last 3 Encounters:  05/11/20 129/63  05/05/20 (!) 146/65  03/28/20 139/62   Lab Results  Component Value Date   CHOL 140 08/07/2017   HDL 62 08/07/2017   LDLCALC 42 08/07/2017   TRIG 179 (H) 08/07/2017   CHOLHDL 2.3 08/07/2017   Goals Addressed  This Visit's Progress     Patient Stated   .  " I would like to get my A1C under 7.0%" (pt-stated)        CARE PLAN ENTRY (see longitudinal plan of care for additional care plan information)  Objective:  Lab Results  Component Value Date   HGBA1C 7.1 (A) 03/28/2020   HGBA1C 7.4 (A) 09/28/2019   HGBA1C 9.0 (A) 06/29/2019   Lab Results  Component Value Date   MICROALBUR 1.4 08/03/2014   LDLCALC 42 08/07/2017   CREATININE 0.96 04/13/2019     Current Barriers:  [name] Knowledge Deficits related to medications used for management of diabetes- patient called this CCM RN stating she is almost out of her diabetes medications and  that she saw a clinic provider on 05/11/20 , the Glyburide was stopped  and she was told her medications would be refilled, she also says she would like help with the Victoza copay if possible  Case Manager Clinical Goal(s):  Over the next 30-60 days, patient will demonstrate improved adherence to prescribed treatment plan for diabetes self care/management as evidenced by:  . daily monitoring and recording of CBG  . adherence to ADA/ carb modified diet . exercise at least 5 days/week . adherence to prescribed medication regimen  Interventions:  . Paged Dr [name] and told her patient needs refills on Metformin and Victoza, she called patient and resolved issue . Collaborated with pharmacy tech Harolyn Rutherford requesting patient assistance with Victoza . Discussed plans with patient for ongoing care management follow up and provided patient with direct contact information for care management team . Encouraged patient to schedule provider appointment when she knows her work schedule  Patient Self Care Activities:  . UNABLE to independently get Hgb A1C under 7.0%  Please see past updates related to this goal by clicking on the "Past Updates" button in the selected goal          Plan:   The care management team will reach out to the patient again over the next 30-60 days.    Kelli Churn RN, CCM, [facility] RN Care Manager [phone]

## 2020-05-21 NOTE — Progress Notes (Signed)
Internal Medicine Clinic Resident  I have personally reviewed this encounter including the documentation in this note and/or discussed this patient with the care management provider. I will address any urgent items identified by the care management provider and will communicate my actions to the patient's PCP. I have reviewed the patient's CCM visit with my supervising attending, Dr Narendra.  Huntley Knoop D Tameron Lama, DO 05/21/2020    

## 2020-05-22 ENCOUNTER — Ambulatory Visit: Payer: Medicare HMO | Admitting: *Deleted

## 2020-05-22 ENCOUNTER — Telehealth: Payer: Self-pay | Admitting: *Deleted

## 2020-05-22 DIAGNOSIS — E1169 Type 2 diabetes mellitus with other specified complication: Secondary | ICD-10-CM

## 2020-05-22 DIAGNOSIS — E1149 Type 2 diabetes mellitus with other diabetic neurological complication: Secondary | ICD-10-CM

## 2020-05-22 DIAGNOSIS — F331 Major depressive disorder, recurrent, moderate: Secondary | ICD-10-CM

## 2020-05-22 DIAGNOSIS — I10 Essential (primary) hypertension: Secondary | ICD-10-CM

## 2020-05-22 NOTE — Chronic Care Management (AMB) (Signed)
Chronic Care Management   Follow Up Note   05/22/2020 Name: Sherri Gill MRN: [id number] DOB: 06/23/1951  Referred by: [name], MD Reason for referral : Chronic Care Management (DM,HLD,HTN)   Sherri Gill is a 69 y.o. year old female who is a primary care patient of [name], MD. The CCM team was consulted for assistance with chronic disease management and care coordination needs.    Review of patient status, including review of consultants reports, relevant laboratory and other test results, and collaboration with appropriate care team members and the patient's provider was performed as part of comprehensive patient evaluation and provision of chronic care management services.    SDOH (Social Determinants of Health) assessments performed: Yes See Care Plan activities for detailed interventions related to [facility])     Outpatient Encounter Medications as of 05/22/2020  Medication Sig Note  . albuterol (PROVENTIL HFA;VENTOLIN HFA) 108 (90 Base) MCG/ACT inhaler Inhale 2 puffs every 6 (six) hours as needed into the lungs for wheezing or shortness of breath.   [name] amLODipine (NORVASC) 10 MG tablet Take by mouth.   [name] atorvastatin (LIPITOR) 20 MG tablet Take by mouth.   . Blood Glucose Monitoring Suppl (ONE TOUCH ULTRA MINI) w/Device KIT Please use as directed.   [name] buPROPion (WELLBUTRIN XL) 150 MG 24 hr tablet Take 1 tablet (150 mg total) by mouth daily.   . Cholecalciferol 1000 units capsule Take 1 capsule (1,000 Units total) by mouth daily.   . ferrous gluconate (FERGON) 324 MG tablet Take 1 tablet (324 mg total) by mouth daily.   . ferrous sulfate 325 (65 FE) MG tablet Take by mouth.   . fluticasone (FLONASE) 50 MCG/ACT nasal spray Place 2 sprays into both nostrils daily.   [name] glucose blood (ONE TOUCH ULTRA TEST) test strip Use as instructed   . glyBURIDE (DIABETA) 5 MG tablet Take by mouth. (Patient not taking: Reported on 05/19/2020) 05/19/2020: Patient states she  is no longer taking Glyburide  . hydrochlorothiazide (HYDRODIURIL) 25 MG tablet TAKE 1 TABLET DAILY WITH LOSARTAN   . hydrochlorothiazide (HYDRODIURIL) 25 MG tablet Take by mouth.   . Insulin Pen Needle 32G X 4 MM MISC 1 each by Does not apply route daily.   [name] losartan (COZAAR) 100 MG tablet Take 1 tablet by mouth once daily   . metFORMIN (GLUCOPHAGE) 1000 MG tablet Take 1 tablet (1,000 mg total) by mouth 2 (two) times daily with a meal.   . ONETOUCH DELICA LANCETS 96P MISC Please use as directed.   . venlafaxine XR (EFFEXOR-XR) 37.5 MG 24 hr capsule Take 1 capsule (37.5 mg total) by mouth daily with breakfast.   . VICTOZA 18 MG/3ML SOPN PLEASE START TAKING VICTOZA 0.6MG DAILY WEEK 1, THEN TAKE 1.2MG DAILY WEEK 2, AND THEN TAKE 1.8MG DAILY THEREAFTER    No facility-administered encounter medications on file as of 05/22/2020.     Objective:  Wt Readings from Last 3 Encounters:  05/11/20 196 lb 3.2 oz (89 kg)  03/28/20 201 lb 8 oz (91.4 kg)  09/28/19 195 lb 3.2 oz (88.5 kg)   BP Readings from Last 3 Encounters:  05/11/20 129/63  05/05/20 (!) 146/65  03/28/20 139/62    Goals Addressed              This Visit's Progress     Patient Stated   .  " I would like to get my A1C under 7.0%" (pt-stated)  CARE PLAN ENTRY (see longitudinal plan of care for additional care plan information)  Objective:  Lab Results  Component Value Date   HGBA1C 7.1 (A) 03/28/2020   HGBA1C 7.4 (A) 09/28/2019   HGBA1C 9.0 (A) 06/29/2019   Lab Results  Component Value Date   MICROALBUR 1.4 08/03/2014   LDLCALC 42 08/07/2017   CREATININE 0.96 04/13/2019       Current Barriers:  [name] Knowledge Deficits related to medications used for management of diabetes- call transferred from the clinic to this CCM RN,  patient states she is in the donut hole and cannot afford her Victoza and is asking for financial assistance   Case Manager Clinical Goal(s):  Over the next 30-60 days, patient will  demonstrate improved adherence to prescribed treatment plan for diabetes self care/management as evidenced by:  . daily monitoring and recording of CBG  . adherence to ADA/ carb modified diet . exercise at least 5 days/week . adherence to prescribed medication regimen  Interventions:  . [employer] patient assistance form and will leave form at clinic front  desk for patient to pick up on 7/29 . Collaborated with pharmacy technician Harolyn Rutherford requesting patient assistance with Victoza . Discussed plans with patient for ongoing care management follow up and provided patient with direct contact information for care management team   Patient Self Care Activities:  . UNABLE to independently get Hgb A1C under 7.0%  Please see past updates related to this goal by clicking on the "Past Updates" button in the selected goal          Plan:   The care management team will reach out to the patient again over the next 30-60 days.    Kelli Churn RN, CCM, [facility] RN Care Manager [phone]

## 2020-05-22 NOTE — Patient Instructions (Signed)
Visit Information I will see if our pharmacy technician can help you with the Victoza copay. Goals Addressed              This Visit's Progress     Patient Stated   .  " I would like to get my A1C under 7.0%" (pt-stated)        CARE PLAN ENTRY (see longitudinal plan of care for additional care plan information)  Objective:  Lab Results  Component Value Date   HGBA1C 7.1 (A) 03/28/2020   HGBA1C 7.4 (A) 09/28/2019   HGBA1C 9.0 (A) 06/29/2019   Lab Results  Component Value Date   MICROALBUR 1.4 08/03/2014   LDLCALC 42 08/07/2017   CREATININE 0.96 04/13/2019       Current Barriers:  [name] Knowledge Deficits related to medications used for management of diabetes- call transferred from the clinic to this CCM RN,  patient states she is in the donut hole and cannot afford her Victoza and is asking for financial assistance   Case Manager Clinical Goal(s):  Over the next 30-60 days, patient will demonstrate improved adherence to prescribed treatment plan for diabetes self care/management as evidenced by:  . daily monitoring and recording of CBG  . adherence to ADA/ carb modified diet . exercise at least 5 days/week . adherence to prescribed medication regimen  Interventions:  . [employer] patient assistance form and will leave form at clinic front  desk for patient to pick up on 7/29 . Collaborated with pharmacy tech Harolyn Rutherford requesting patient assistance with Victoza . Discussed plans with patient for ongoing care management follow up and provided patient with direct contact information for care management team   Patient Self Care Activities:  . UNABLE to independently get Hgb A1C under 7.0%  Please see past updates related to this goal by clicking on the "Past Updates" button in the selected goal         The patient verbalized understanding of instructions provided today and declined a print copy of patient instruction materials.   The care management team  will reach out to the patient again over the next 30-60 days.   Kelli Churn RN, CCM, [facility] RN Care Manager [phone]

## 2020-05-22 NOTE — Telephone Encounter (Signed)
pt has called about application that she states she returned to a doctor, kelly do you know anything about this, spoke to [facility] and they have assumed that [facility] has not responded well to pt's needs, please call me in am at [phone]

## 2020-05-22 NOTE — Progress Notes (Signed)
Internal Medicine Clinic Attending  CCM services provided by the care management provider and their documentation were discussed with Dr. Bloomfield. We reviewed the pertinent findings, urgent action items addressed by the resident and non-urgent items to be addressed by the PCP.  I agree with the assessment, diagnosis, and plan of care documented in the CCM and resident's note.  Terrence Pizana, MD 05/22/2020  

## 2020-05-23 ENCOUNTER — Ambulatory Visit: Payer: Medicare HMO | Admitting: *Deleted

## 2020-05-23 ENCOUNTER — Other Ambulatory Visit: Payer: Self-pay | Admitting: *Deleted

## 2020-05-23 DIAGNOSIS — E1169 Type 2 diabetes mellitus with other specified complication: Secondary | ICD-10-CM

## 2020-05-23 DIAGNOSIS — I10 Essential (primary) hypertension: Secondary | ICD-10-CM

## 2020-05-23 DIAGNOSIS — E1149 Type 2 diabetes mellitus with other diabetic neurological complication: Secondary | ICD-10-CM

## 2020-05-23 DIAGNOSIS — F331 Major depressive disorder, recurrent, moderate: Secondary | ICD-10-CM

## 2020-05-23 DIAGNOSIS — IMO0002 Reserved for concepts with insufficient information to code with codable children: Secondary | ICD-10-CM

## 2020-05-23 MED ORDER — METFORMIN HCL 1000 MG PO TABS: 1000.0000 mg | ORAL_TABLET | Freq: Two times a day (BID) | ORAL | 0 refills | Status: DC

## 2020-05-23 NOTE — Telephone Encounter (Signed)
Spoke w/ kelly this am, we will work together to resolve this issue. Will look for application that has been completed

## 2020-05-23 NOTE — Progress Notes (Signed)
Internal Medicine Clinic Resident ° °I have personally reviewed this encounter including the documentation in this note and/or discussed this patient with the care management provider. I will address any urgent items identified by the care management provider and will communicate my actions to the patient's PCP. I have reviewed the patient's CCM visit with my supervising attending, Dr Hoffman. ° °Veldon Wager, MD  °[facility] PGY-2 °05/23/2020 ° ° ° °

## 2020-05-23 NOTE — Progress Notes (Signed)
Internal Medicine Clinic Resident ° °I have personally reviewed this encounter including the documentation in this note and/or discussed this patient with the care management provider. I will address any urgent items identified by the care management provider and will communicate my actions to the patient's PCP. I have reviewed the patient's CCM visit with my supervising attending, Dr Hoffman. ° °Sherri Bellotti, MD  °[facility] PGY-2 °05/23/2020 ° ° ° °

## 2020-05-23 NOTE — Telephone Encounter (Signed)
I spoke to patient and mailed an application to her on 25/95/6387.  I do not have a record of receiving a completed application from patient.  Sherri Gill advised patient to come to office on Thursday while I am there to help her complete the application.  At this time, I do not have a signed application from patient.

## 2020-05-23 NOTE — Chronic Care Management (AMB) (Signed)
Chronic Care Management   Follow Up Note   05/23/2020 Name: Sherri Gill MRN: [id number] DOB: 09-Nov-1950  Referred by: [name], MD Reason for referral : Chronic Care Management (DM, HTN, HLD)   Sherri Gill is a 69 y.o. year old female who is a primary care patient of [name], MD. The CCM team was consulted for assistance with chronic disease management and care coordination needs.    Review of patient status, including review of consultants reports, relevant laboratory and other test results, and collaboration with appropriate care team members and the patient's provider was performed as part of comprehensive patient evaluation and provision of chronic care management services.    SDOH (Social Determinants of Health) assessments performed: No See Care Plan activities for detailed interventions related to [facility])     Outpatient Encounter Medications as of 05/23/2020  Medication Sig Note  . albuterol (PROVENTIL HFA;VENTOLIN HFA) 108 (90 Base) MCG/ACT inhaler Inhale 2 puffs every 6 (six) hours as needed into the lungs for wheezing or shortness of breath.   [name] amLODipine (NORVASC) 10 MG tablet Take by mouth.   [name] atorvastatin (LIPITOR) 20 MG tablet Take by mouth.   . Blood Glucose Monitoring Suppl (ONE TOUCH ULTRA MINI) w/Device KIT Please use as directed.   [name] buPROPion (WELLBUTRIN XL) 150 MG 24 hr tablet Take 1 tablet (150 mg total) by mouth daily.   . Cholecalciferol 1000 units capsule Take 1 capsule (1,000 Units total) by mouth daily.   . ferrous gluconate (FERGON) 324 MG tablet Take 1 tablet (324 mg total) by mouth daily.   . ferrous sulfate 325 (65 FE) MG tablet Take by mouth.   . fluticasone (FLONASE) 50 MCG/ACT nasal spray Place 2 sprays into both nostrils daily.   [name] glucose blood (ONE TOUCH ULTRA TEST) test strip Use as instructed   . glyBURIDE (DIABETA) 5 MG tablet Take by mouth. (Patient not taking: Reported on 05/19/2020) 05/19/2020: Patient states she  is no longer taking Glyburide  . hydrochlorothiazide (HYDRODIURIL) 25 MG tablet TAKE 1 TABLET DAILY WITH LOSARTAN   . hydrochlorothiazide (HYDRODIURIL) 25 MG tablet Take by mouth.   . Insulin Pen Needle 32G X 4 MM MISC 1 each by Does not apply route daily.   [name] losartan (COZAAR) 100 MG tablet Take 1 tablet by mouth once daily   . metFORMIN (GLUCOPHAGE) 1000 MG tablet Take 1 tablet (1,000 mg total) by mouth 2 (two) times daily with a meal.   . ONETOUCH DELICA LANCETS 93J MISC Please use as directed.   . venlafaxine XR (EFFEXOR-XR) 37.5 MG 24 hr capsule Take 1 capsule (37.5 mg total) by mouth daily with breakfast.   . VICTOZA 18 MG/3ML SOPN PLEASE START TAKING VICTOZA 0.6MG DAILY WEEK 1, THEN TAKE 1.2MG DAILY WEEK 2, AND THEN TAKE 1.8MG DAILY THEREAFTER    No facility-administered encounter medications on file as of 05/23/2020.     Objective:  Wt Readings from Last 3 Encounters:  05/11/20 196 lb 3.2 oz (89 kg)  03/28/20 201 lb 8 oz (91.4 kg)  09/28/19 195 lb 3.2 oz (88.5 kg)   BP Readings from Last 3 Encounters:  05/11/20 129/63  05/05/20 (!) 146/65  03/28/20 139/62   Lab Results  Component Value Date   CHOL 140 08/07/2017   HDL 62 08/07/2017   LDLCALC 42 08/07/2017   TRIG 179 (H) 08/07/2017   CHOLHDL 2.3 08/07/2017  please consider updating  lipid profile  Goals Addressed  This Visit's Progress     Patient Stated   .  " I would like to get my A1C under 7.0%" (pt-stated)        CARE PLAN ENTRY (see longitudinal plan of care for additional care plan information)  Objective:  Lab Results  Component Value Date   HGBA1C 7.1 (A) 03/28/2020   HGBA1C 7.4 (A) 09/28/2019   HGBA1C 9.0 (A) 06/29/2019   Lab Results  Component Value Date   MICROALBUR 1.4 08/03/2014   LDLCALC 42 08/07/2017   CREATININE 0.96 04/13/2019       Current Barriers:  [name] Knowledge Deficits related to medications used for management of diabetes- patient states she can meet with [place] technician on 05/25/20 at the clinic to complete paperwork for Victoza patient assistance program   Case Manager Clinical Goal(s):  Over the next 30-60 days, patient will demonstrate improved adherence to prescribed treatment plan for diabetes self care/management as evidenced by:  . daily monitoring and recording of CBG  . adherence to ADA/ carb modified diet . exercise at least 5 days/week . adherence to prescribed medication regimen  Interventions:  . Will place [employer] patient assistance form on [employer] at the clinic . Messaged  pharmacy tech Harolyn Rutherford that patient will be at the clinic on 05/25/20 at 10:30 am to meet with her to complete Victoza patient assistance program paperwork  . Discussed plans with patient for ongoing care management follow up and provided patient with direct contact information for care management team   Patient Self Care Activities:  . UNABLE to independently get Hgb A1C under 7.0%  Please see past updates related to this goal by clicking on the "Past Updates" button in the selected goal          Plan:   The care management team will reach out to the patient again over the next 30-60 days.    Kelli Churn RN, CCM, [facility] RN Care Manager [phone]

## 2020-05-25 ENCOUNTER — Telehealth: Payer: Self-pay

## 2020-05-25 NOTE — Telephone Encounter (Signed)
°  Chronic Care Management   Outreach Note  05/25/2020 Name: Sherri Gill MRN: [id number] DOB: September 15, 1951  Referred by: [name], MD Reason for referral : No chief complaint on file.   An unsuccessful telephone outreach was attempted today. The patient was referred to the case management team for assistance with care management and care coordination. Unable to leave message as voicemail box is not set up yet.   Follow Up Plan: Will attempt to reach patient again within the next two weeks.     Ronn Melena, [place] Coordination Social Worker [place] [phone]

## 2020-05-26 ENCOUNTER — Telehealth: Payer: Medicare HMO

## 2020-05-26 NOTE — Progress Notes (Signed)
Internal Medicine Clinic Attending  CCM services provided by the care management provider and their documentation were discussed with Dr. Aslam. We reviewed the pertinent findings, urgent action items addressed by the resident and non-urgent items to be addressed by the PCP.  I agree with the assessment, diagnosis, and plan of care documented in the CCM and resident's note.  Tess Potts C Menaal Russum, DO 05/26/2020  

## 2020-05-26 NOTE — Progress Notes (Signed)
Internal Medicine Clinic Attending  CCM services provided by the care management provider and their documentation were discussed with Dr. Aslam. We reviewed the pertinent findings, urgent action items addressed by the resident and non-urgent items to be addressed by the PCP.  I agree with the assessment, diagnosis, and plan of care documented in the CCM and resident's note.  Marleny Faller C Victora Irby, DO 05/26/2020  

## 2020-05-30 ENCOUNTER — Ambulatory Visit: Payer: Medicare HMO | Admitting: *Deleted

## 2020-05-30 DIAGNOSIS — E1169 Type 2 diabetes mellitus with other specified complication: Secondary | ICD-10-CM

## 2020-05-30 DIAGNOSIS — F331 Major depressive disorder, recurrent, moderate: Secondary | ICD-10-CM

## 2020-05-30 DIAGNOSIS — I10 Essential (primary) hypertension: Secondary | ICD-10-CM

## 2020-05-30 DIAGNOSIS — E785 Hyperlipidemia, unspecified: Secondary | ICD-10-CM

## 2020-05-30 DIAGNOSIS — E1149 Type 2 diabetes mellitus with other diabetic neurological complication: Secondary | ICD-10-CM

## 2020-05-30 NOTE — Patient Instructions (Signed)
Visit Information I will call you on Thursday 06/01/20 at 3:30 per your request.  Goals Addressed              This Visit's Progress     Patient Stated     " I would like to get my A1C under 7.0%" (pt-stated)        CARE PLAN ENTRY (see longitudinal plan of care for additional care plan information)  Objective:  Lab Results  Component Value Date   HGBA1C 7.1 (A) 03/28/2020   HGBA1C 7.4 (A) 09/28/2019   HGBA1C 9.0 (A) 06/29/2019   Lab Results  Component Value Date   MICROALBUR 1.4 08/03/2014   LDLCALC 42 08/07/2017   CREATININE 0.96 04/13/2019       Current Barriers:   Knowledge Deficits related to medications used for management of diabetes- spoke with patient, she was very complimentary of Cheryle Horsfall, the pharmacy technician that help her complete Victoza Patient Assistance Program paperwork on 7/29 at the clinic. She is requesting assistance with finding a counselor as her schedule  does not correlate with [facility] clinic counselor now that she is working part time at [employer]. She is also requesting  assistance with securing a primary care provider for her adult son.  Case Manager Clinical Goal(s):  Over the next 30-60 days, patient will demonstrate improved adherence to prescribed treatment plan for diabetes self care/management as evidenced by:   daily monitoring and recording of CBG   adherence to ADA/ carb modified diet  exercise at least 5 days/week  adherence to prescribed medication regimen  Interventions:   Discussed plans with patient for ongoing care management follow up and provided patient with direct contact information for care management team  Re-educated patient to role of CCM RN  At patient's request, scheduled follow up telephone appointment for 06/01/20 at 3:30 pm.   Patient Self Care Activities:   UNABLE to independently get Hgb A1C under 7.0%  Please see past updates related to this goal by clicking on the "Past Updates" button  in the selected goal         The patient verbalized understanding of instructions provided today and declined a print copy of patient instruction materials.   The care management team will reach out to the patient again over the next 7 days.   Kelli Churn RN, CCM, [facility] RN Care Manager [phone]

## 2020-05-30 NOTE — Progress Notes (Signed)
Internal Medicine Clinic Resident  I have personally reviewed this encounter including the documentation in this note and/or discussed this patient with the care management provider. Ordered lipid panel for next clinic visit. I will address any urgent items identified by the care management provider and will communicate my actions to the patient's PCP. I have reviewed the patient's CCM visit with my supervising attending, Dr Daryll Drown.  Virl Axe, MD 05/30/2020

## 2020-05-30 NOTE — Chronic Care Management (AMB) (Signed)
Chronic Care Management   Follow Up Note   05/30/2020 Name: Sherri Gill MRN: [id number] DOB: 1951/05/19  Referred by: [name], MD Reason for referral : Chronic Care Management (DM, HTN, HLD, MDD)   Sherri Gill is a 69 y.o. year old female who is a primary care patient of [name], MD. The CCM team was consulted for assistance with chronic disease management and care coordination needs.    Review of patient status, including review of consultants reports, relevant laboratory and other test results, and collaboration with appropriate care team members and the patient's provider was performed as part of comprehensive patient evaluation and provision of chronic care management services.    SDOH (Social Determinants of Health) assessments performed: No See Care Plan activities for detailed interventions related to [facility])     Outpatient Encounter Medications as of 05/30/2020  Medication Sig Note  . albuterol (PROVENTIL HFA;VENTOLIN HFA) 108 (90 Base) MCG/ACT inhaler Inhale 2 puffs every 6 (six) hours as needed into the lungs for wheezing or shortness of breath.   [name] amLODipine (NORVASC) 10 MG tablet Take by mouth.   [name] atorvastatin (LIPITOR) 20 MG tablet Take by mouth.   . Blood Glucose Monitoring Suppl (ONE TOUCH ULTRA MINI) w/Device KIT Please use as directed.   [name] buPROPion (WELLBUTRIN XL) 150 MG 24 hr tablet Take 1 tablet (150 mg total) by mouth daily.   . Cholecalciferol 1000 units capsule Take 1 capsule (1,000 Units total) by mouth daily.   . ferrous gluconate (FERGON) 324 MG tablet Take 1 tablet (324 mg total) by mouth daily.   . ferrous sulfate 325 (65 FE) MG tablet Take by mouth.   . fluticasone (FLONASE) 50 MCG/ACT nasal spray Place 2 sprays into both nostrils daily.   [name] glucose blood (ONE TOUCH ULTRA TEST) test strip Use as instructed   . glyBURIDE (DIABETA) 5 MG tablet Take by mouth. (Patient not taking: Reported on 05/19/2020) 05/19/2020: Patient states  she is no longer taking Glyburide  . hydrochlorothiazide (HYDRODIURIL) 25 MG tablet TAKE 1 TABLET DAILY WITH LOSARTAN   . hydrochlorothiazide (HYDRODIURIL) 25 MG tablet Take by mouth.   . Insulin Pen Needle 32G X 4 MM MISC 1 each by Does not apply route daily.   [name] losartan (COZAAR) 100 MG tablet Take 1 tablet by mouth once daily   . metFORMIN (GLUCOPHAGE) 1000 MG tablet Take 1 tablet (1,000 mg total) by mouth 2 (two) times daily with a meal.   . ONETOUCH DELICA LANCETS 45O MISC Please use as directed.   . venlafaxine XR (EFFEXOR-XR) 37.5 MG 24 hr capsule Take 1 capsule (37.5 mg total) by mouth daily with breakfast.   . VICTOZA 18 MG/3ML SOPN PLEASE START TAKING VICTOZA 0.6MG DAILY WEEK 1, THEN TAKE 1.2MG DAILY WEEK 2, AND THEN TAKE 1.8MG DAILY THEREAFTER    No facility-administered encounter medications on file as of 05/30/2020.     Objective:  Lab Results  Component Value Date   CHOL 140 08/07/2017   HDL 62 08/07/2017   LDLCALC 42 08/07/2017   TRIG 179 (H) 08/07/2017   CHOLHDL 2.3 08/07/2017  consider updating lipid panel-  patient with DM and on statin therapy  Wt Readings from Last 3 Encounters:  05/11/20 196 lb 3.2 oz (89 kg)  03/28/20 201 lb 8 oz (91.4 kg)  09/28/19 195 lb 3.2 oz (88.5 kg)   BP Readings from Last 3 Encounters:  05/11/20 129/63  05/05/20 (!) 146/65  03/28/20 139/62  Goals Addressed              This Visit's Progress     Patient Stated   .  " I would like to get my A1C under 7.0%" (pt-stated)        CARE PLAN ENTRY (see longitudinal plan of care for additional care plan information)  Objective:  Lab Results  Component Value Date   HGBA1C 7.1 (A) 03/28/2020   HGBA1C 7.4 (A) 09/28/2019   HGBA1C 9.0 (A) 06/29/2019   Lab Results  Component Value Date   MICROALBUR 1.4 08/03/2014   LDLCALC 42 08/07/2017   CREATININE 0.96 04/13/2019       Current Barriers:  [name] Knowledge Deficits related to medications used for management of diabetes- spoke  with patient, she was very complimentary of Cheryle Horsfall, the pharmacy technician that help her complete [employer] Patient Assistance Program paperwork on 7/29 at the clinic. She is requesting assistance with finding a counselor as her schedule  does not correlate with [facility] counselor now that she is working part time at [employer]. She is also requesting  assistance with securing a primary care provider for her adult son.  Case Manager Clinical Goal(s):  Over the next 30-60 days, patient will demonstrate improved adherence to prescribed treatment plan for diabetes self care/management as evidenced by:  . daily monitoring and recording of CBG  . adherence to ADA/ carb modified diet . exercise at least 5 days/week . adherence to prescribed medication regimen  Interventions:  . Discussed plans with patient for ongoing care management follow up and provided patient with direct contact information for care management team . Re-educated patient to role of CCM RN . At patient's request, scheduled follow up telephone appointment for 06/01/20 at 3:30 pm.   Patient Self Care Activities:  . UNABLE to independently get Hgb A1C under 7.0%  Please see past updates related to this goal by clicking on the "Past Updates" button in the selected goal          Plan:   The care management team will reach out to the patient again over the next 7 days.    Kelli Churn RN, CCM, [facility] RN Care Manager [phone]

## 2020-05-30 NOTE — Addendum Note (Signed)
Addended by: Virl Axe on: 05/30/2020 05:29 PM   Modules accepted: Orders

## 2020-06-01 ENCOUNTER — Telehealth: Payer: Self-pay | Admitting: *Deleted

## 2020-06-01 ENCOUNTER — Telehealth: Payer: Self-pay

## 2020-06-01 ENCOUNTER — Telehealth: Payer: Medicare HMO

## 2020-06-01 NOTE — Telephone Encounter (Signed)
[place] medication assistance application for [employer] today to company.

## 2020-06-01 NOTE — Telephone Encounter (Signed)
°  Chronic Care Management   Outreach Note  06/01/2020 Name: Sherri Gill MRN: [id number] DOB: 1951/02/21  Referred by: [name], MD Reason for referral : Chronic Care Management (DM, HLD, HTN)   An unsuccessful telephone outreach was attempted today. Attempted to reach patient at 4:00 pm for 3:30 pm scheuled call per patient's request. Unable to leave a message as recording states voice mailbox has not been set up yet. The patient was referred to the case management team for assistance with care management and care coordination.   Follow Up Plan: The care management team will reach out to the patient again over the next 7-10 days.   Kelli Churn RN, CCM, [facility] RN Care Manager [phone]

## 2020-06-07 NOTE — Progress Notes (Signed)
Internal Medicine Clinic Attending  CCM services provided by the care management provider and their documentation were discussed with Dr. Allyson Sabal. We reviewed the pertinent findings, urgent action items addressed by the resident and non-urgent items to be addressed by the PCP.  I agree with the assessment, diagnosis, and plan of care documented in the CCM and resident's note.  Gilles Chiquito, MD 06/07/2020

## 2020-06-08 ENCOUNTER — Telehealth: Payer: Medicare HMO

## 2020-06-08 ENCOUNTER — Telehealth: Payer: Self-pay

## 2020-06-08 NOTE — Telephone Encounter (Signed)
°  Chronic Care Management   Outreach Note  06/08/2020 Name: Sherri Gill MRN: [id number] DOB: 1951/10/16  Referred by: [name], MD Reason for referral : No chief complaint on file.   A second unsuccessful telephone outreach was attempted today. The patient was referred to the case management team for assistance with care management and care coordination. Unable to leave message due to mailbox being full  Follow Up Plan: The care management team will reach out to the patient again over the next 7 days.      Ronn Melena, [place] Coordination Social Worker [place] [phone]

## 2020-06-12 ENCOUNTER — Ambulatory Visit: Payer: Medicare HMO | Admitting: *Deleted

## 2020-06-12 DIAGNOSIS — I10 Essential (primary) hypertension: Secondary | ICD-10-CM

## 2020-06-12 DIAGNOSIS — F331 Major depressive disorder, recurrent, moderate: Secondary | ICD-10-CM

## 2020-06-12 DIAGNOSIS — E1149 Type 2 diabetes mellitus with other diabetic neurological complication: Secondary | ICD-10-CM

## 2020-06-12 DIAGNOSIS — E1169 Type 2 diabetes mellitus with other specified complication: Secondary | ICD-10-CM

## 2020-06-12 DIAGNOSIS — E785 Hyperlipidemia, unspecified: Secondary | ICD-10-CM

## 2020-06-12 NOTE — Progress Notes (Signed)
Internal Medicine Clinic Resident  I have personally reviewed this encounter including the documentation in this note and/or discussed this patient with the care management provider. I will address any urgent items identified by the care management provider and will communicate my actions to the patient's PCP. I have reviewed the patient's CCM visit with my supervising attending, Dr Philipp Ovens.  Andrew Au, MD 06/12/2020

## 2020-06-12 NOTE — Chronic Care Management (AMB) (Signed)
Chronic Care Management   Follow Up Note   06/12/2020 Name: Sherri Gill MRN: [id number] DOB: [birth year]  Referred by: [name], MD Reason for referral : Chronic Care Management (NIDDM, HTN, HLD, depreesion/anxiety)   Sherri Gill is a 69 y.o. year old female who is a primary care patient of [name], MD. The CCM team was consulted for assistance with chronic disease management and care coordination needs.    Review of patient status, including review of consultants reports, relevant laboratory and other test results, and collaboration with appropriate care team members and the patient's provider was performed as part of comprehensive patient evaluation and provision of chronic care management services.    SDOH (Social Determinants of Health) assessments performed: No See Care Plan activities for detailed interventions related to [facility])     Outpatient Encounter Medications as of 06/12/2020  Medication Sig Note  . albuterol (PROVENTIL HFA;VENTOLIN HFA) 108 (90 Base) MCG/ACT inhaler Inhale 2 puffs every 6 (six) hours as needed into the lungs for wheezing or shortness of breath.   [name] amLODipine (NORVASC) 10 MG tablet Take by mouth.   [name] atorvastatin (LIPITOR) 20 MG tablet Take by mouth.   . Blood Glucose Monitoring Suppl (ONE TOUCH ULTRA MINI) w/Device KIT Please use as directed.   [name] buPROPion (WELLBUTRIN XL) 150 MG 24 hr tablet Take 1 tablet (150 mg total) by mouth daily.   . Cholecalciferol 1000 units capsule Take 1 capsule (1,000 Units total) by mouth daily.   . ferrous gluconate (FERGON) 324 MG tablet Take 1 tablet (324 mg total) by mouth daily.   . ferrous sulfate 325 (65 FE) MG tablet Take by mouth.   . fluticasone (FLONASE) 50 MCG/ACT nasal spray Place 2 sprays into both nostrils daily.   [name] glucose blood (ONE TOUCH ULTRA TEST) test strip Use as instructed   . glyBURIDE (DIABETA) 5 MG tablet Take by mouth. (Patient not taking: Reported on 05/19/2020)  05/19/2020: Patient states she is no longer taking Glyburide  . hydrochlorothiazide (HYDRODIURIL) 25 MG tablet TAKE 1 TABLET DAILY WITH LOSARTAN   . hydrochlorothiazide (HYDRODIURIL) 25 MG tablet Take by mouth.   . Insulin Pen Needle 32G X 4 MM MISC 1 each by Does not apply route daily.   [name] losartan (COZAAR) 100 MG tablet Take 1 tablet by mouth once daily   . metFORMIN (GLUCOPHAGE) 1000 MG tablet Take 1 tablet (1,000 mg total) by mouth 2 (two) times daily with a meal.   . ONETOUCH DELICA LANCETS 10G MISC Please use as directed.   . venlafaxine XR (EFFEXOR-XR) 37.5 MG 24 hr capsule Take 1 capsule (37.5 mg total) by mouth daily with breakfast.   . VICTOZA 18 MG/3ML SOPN PLEASE START TAKING VICTOZA 0.6MG DAILY WEEK 1, THEN TAKE 1.2MG DAILY WEEK 2, AND THEN TAKE 1.8MG DAILY THEREAFTER    No facility-administered encounter medications on file as of 06/12/2020.     Objective:  Wt Readings from Last 3 Encounters:  05/11/20 196 lb 3.2 oz (89 kg)  03/28/20 201 lb 8 oz (91.4 kg)  09/28/19 195 lb 3.2 oz (88.5 kg)   BP Readings from Last 3 Encounters:  05/11/20 129/63  05/05/20 (!) 146/65  03/28/20 139/62    Goals Addressed              This Visit's Progress     Patient Stated   .  " I would like to get my A1C under 7.0%" (pt-stated)  CARE PLAN ENTRY (see longitudinal plan of care for additional care plan information)  Objective:  Lab Results  Component Value Date   HGBA1C 7.1 (A) 03/28/2020   HGBA1C 7.4 (A) 09/28/2019   HGBA1C 9.0 (A) 06/29/2019   Lab Results  Component Value Date   MICROALBUR 1.4 08/03/2014   LDLCALC 42 08/07/2017   CREATININE 0.96 04/13/2019       Current Barriers:  [name] Knowledge Deficits related to medications used for management of diabetes- returned call to patient, she apologized for not keeping the scheduled 06/01/20 telephone appointment with this CCM RN  says she had to work, she is asking if Cheryle Horsfall, the pharmacy technician has any follow up  information regarding her application to the [place] Patient Assistance Program and she is also requesting  assistance with securing a primary care provider for her adult son.  Case Manager Clinical Goal(s):  Over the next 30-60 days, patient will demonstrate improved adherence to prescribed treatment plan for diabetes self care/management as evidenced by:  . daily monitoring and recording of CBG  . adherence to ADA/ carb modified diet . exercise at least 5 days/week . adherence to prescribed medication regimen  Interventions:  . Discussed plans with patient for ongoing care management follow up and provided patient with direct contact information for care management team . Collaborated with Yvonna Alanis, director of Internal Medicine Clinic, then advised patient that clinic is accepting new patients and her son should call the clinic at [phone] to schedule an initial appointment . Advised patient this CCM RN will contact Cheryle Horsfall, pharmacy technician, and ask her to follow up with patient about the [employer] Patient Assistance Program    Patient Self Care Activities:  . UNABLE to independently get Hgb A1C under 7.0%  Please see past updates related to this goal by clicking on the "Past Updates" button in the selected goal          Plan:   The care management team will reach out to the patient again over the next 30-60 days.    Kelli Churn RN, CCM, [facility] RN Care Manager [phone]

## 2020-06-12 NOTE — Patient Instructions (Signed)
Visit Information It was nice speaking with you today.  Goals Addressed              This Visit's Progress     Patient Stated   .  " I would like to get my A1C under 7.0%" (pt-stated)        CARE PLAN ENTRY (see longitudinal plan of care for additional care plan information)  Objective:  Lab Results  Component Value Date   HGBA1C 7.1 (A) 03/28/2020   HGBA1C 7.4 (A) 09/28/2019   HGBA1C 9.0 (A) 06/29/2019   Lab Results  Component Value Date   MICROALBUR 1.4 08/03/2014   LDLCALC 42 08/07/2017   CREATININE 0.96 04/13/2019       Current Barriers:  [name] Knowledge Deficits related to medications used for management of diabetes- returned call to patient, she apologized for not keeping the scheduled 06/01/20 telephone appointment with this CCM RN  says she had to work, she is asking if Cheryle Horsfall, the pharmacy technician has any follow up information regarding her application to the [place] Patient Assistance Program and she is also requesting  assistance with securing a primary care provider for her adult son.  Case Manager Clinical Goal(s):  Over the next 30-60 days, patient will demonstrate improved adherence to prescribed treatment plan for diabetes self care/management as evidenced by:  . daily monitoring and recording of CBG  . adherence to ADA/ carb modified diet . exercise at least 5 days/week . adherence to prescribed medication regimen  Interventions:  . Discussed plans with patient for ongoing care management follow up and provided patient with direct contact information for care management team . Collaborated with Yvonna Alanis, director of Internal Medicine Clinic, then advised patient that clinic is accepting new patients and her son should call the clinic at [phone] to schedule an initial appointment . Advised patient this CCM RN will contact Cheryle Horsfall, pharmacy technician, and ask her to follow up with patient about the [employer] Patient Assistance Program     Patient Self Care Activities:  . UNABLE to independently get Hgb A1C under 7.0%  Please see past updates related to this goal by clicking on the "Past Updates" button in the selected goal         The patient verbalized understanding of instructions provided today and declined a print copy of patient instruction materials.   The care management team will reach out to the patient again over the next 30-60 days.   Kelli Churn RN, CCM, [facility] RN Care Manager [phone]

## 2020-06-13 ENCOUNTER — Telehealth: Payer: Medicare HMO

## 2020-06-13 NOTE — Progress Notes (Signed)
Internal Medicine Clinic Attending  CCM services provided by the care management provider and their documentation were discussed with Dr. Bridgett Larsson. We reviewed the pertinent findings, urgent action items addressed by the resident and non-urgent items to be addressed by the PCP.  I agree with the assessment, diagnosis, and plan of care documented in the CCM and resident's note.  Velna Ochs, MD 06/13/2020

## 2020-06-14 ENCOUNTER — Telehealth: Payer: Self-pay

## 2020-06-14 NOTE — Telephone Encounter (Signed)
Spoke to Sherri Gill via phone, pt is aware of application status.  Per [employer], they needed the pt. Portion refaxed to them with the insurance plan name written in-which I did today.  Still waiting on approval/status and meds.  [employer] rep stated today that everything submitted was acceptable and that what I refaxed today was all they needed to get pt approved for patient assistance.

## 2020-06-14 NOTE — Telephone Encounter (Signed)
-----   Message from Barrington Ellison, RN sent at 06/13/2020  9:52 AM EDT ----- Madaline Brilliant thanks Claiborne Billings. Marcie Bal ----- Message ----- From: Rickey Barbara, CPhT Sent: 06/13/2020   9:39 AM EDT To: Barrington Ellison, RN  When I get a moment I can call [employer], I haven't heard anything yet-but I just got back yesterday.  It has been a week since it was submitted, so hopefully they have had time to process the application. ----- Message ----- From: Barrington Ellison, RN Sent: 06/12/2020   5:22 PM EDT To: Rickey Barbara, CPhT  Reggie Pile, Ms Yera is asking if you have heard back from the [employer] PAP. Please call her with an update. She works until 3:30 pm on M,T,W so she requests you call her after 3:30 on those days. Thanks, Marcie Bal

## 2020-06-15 ENCOUNTER — Telehealth: Payer: Self-pay

## 2020-06-15 ENCOUNTER — Ambulatory Visit: Payer: Medicare HMO

## 2020-06-15 DIAGNOSIS — E1169 Type 2 diabetes mellitus with other specified complication: Secondary | ICD-10-CM

## 2020-06-15 DIAGNOSIS — E1149 Type 2 diabetes mellitus with other diabetic neurological complication: Secondary | ICD-10-CM

## 2020-06-15 DIAGNOSIS — I10 Essential (primary) hypertension: Secondary | ICD-10-CM

## 2020-06-15 NOTE — Chronic Care Management (AMB) (Signed)
Care Management   Follow Up Note   06/15/2020 Name: Sherri Gill MRN: [id number] DOB: 1951-05-28  Referred by: [name], MD Reason for referral : Care Coordination (Care Coordination)   Sherri Gill is a 69 y.o. year old female who is a primary care patient of [name], MD. The care management team was consulted for assistance with care management and care coordination needs.    Review of patient status, including review of consultants reports, relevant laboratory and other test results, and collaboration with appropriate care team members and the patient's provider was performed as part of comprehensive patient evaluation and provision of chronic care management services.    SDOH (Social Determinants of Health) assessments performed: No See Care Plan activities for detailed interventions related to [facility])     Advanced Directives: See Care Plan and [employer] application for related entries.   Goals Addressed              This Visit's Progress   .  " I would like to get my A1C under 7.0%" (pt-stated)        CARE PLAN ENTRY (see longitudinal plan of care for additional care plan information)  Objective:  Lab Results  Component Value Date   HGBA1C 7.1 (A) 03/28/2020   HGBA1C 7.4 (A) 09/28/2019   HGBA1C 9.0 (A) 06/29/2019   Lab Results  Component Value Date   MICROALBUR 1.4 08/03/2014   LDLCALC 42 08/07/2017   CREATININE 0.96 04/13/2019       Current Barriers:  [name] Knowledge Deficits related to medications used for management of diabetes- returned call to patient, she apologized for not keeping the scheduled 06/01/20 telephone appointment with this CCM RN  says she had to work, she is asking if Cheryle Horsfall, the pharmacy technician has any follow up information regarding her application to the [place] Patient Assistance Program and she is also requesting  assistance with securing a primary care provider for her adult son.  Case Manager Clinical  Goal(s):  Over the next 30-60 days, patient will demonstrate improved adherence to prescribed treatment plan for diabetes self care/management as evidenced by:  . daily monitoring and recording of CBG  . adherence to ADA/ carb modified diet . exercise at least 5 days/week . adherence to prescribed medication regimen  Interventions:  . Discussed plans with patient for ongoing care management follow up and provided patient with direct contact information for care management team . Collaborated with Yvonna Alanis, director of Internal Medicine Clinic, then advised patient that clinic is accepting new patients and her son should call the clinic at [phone] to schedule an initial appointment . Advised patient this CCM RN will contact Cheryle Horsfall, pharmacy technician, and ask her to follow up with patient about the [employer] Patient Assistance Program    Patient Self Care Activities:  . UNABLE to independently get Hgb A1C under 7.0%  Please see past updates related to this goal by clicking on the "Past Updates" button in the selected goal      .  COMPLETED: "I need to find employment" (pt-stated)        Current Barriers:  [name] Knowledge Barriers related to resources available to assist with seeking employment.  Patient reports that she worked at [employer] for years but was laid off due to [place] 19 pandemic.  She has been on unemployment since being laid off.  Patient has been trying to find employment but keeps "getting the run around."  Patient states that  she has difficulty using computers which makes it harder to find employment/submit application.  Patient did say that she utilizes computers at [employer] and intends to go there today to submit applications.   Update 04/21/20:  Patient has obtained part time employment at [employer] [place] Goal(s):  [name] Over the next 90 days, patient will work with BSW to address needs related to finding employment.   . Over the next 90 days, BSW  will collaborate with RN Care Manager to address care management and care coordination needs  Interventions:   . Closing goal of care plan as patient has secured employment  Patient Self Care Activities:  . Self administers medications as prescribed . Attends all scheduled provider appointments . Calls pharmacy for medication refills . Calls provider office for new concerns or questions  Please see past updates related to this goal by clicking on the "Past Updates" button in the selected goal          Patient's primary concern today was getting her son scheduled with a new Primary Care Provider at [facility].  Son scheduled for 06/22/20 @ 1:15PM. No SW follow being scheduled at this time but can assist patient in the future if needed.        Ronn Melena, [place] Coordination Social Worker [place] [phone]

## 2020-06-15 NOTE — Telephone Encounter (Signed)
  Chronic Care Management   Outreach Note  06/15/2020 Name: Sherri Gill MRN: [id number] DOB: 1950-12-15  Referred by: [name], MD Reason for referral : Care Coordination  Successful outreach to patient this morning, however, she was at an appointment with her son. Stated that she would call back this afternoon. Will attempt to reach patient again next week if no return call.      Ronn Melena, [place] Coordination Social Worker [place] [phone]

## 2020-06-15 NOTE — Patient Instructions (Signed)
Visit Information  Goals Addressed              This Visit's Progress   .  " I would like to get my A1C under 7.0%" (pt-stated)        CARE PLAN ENTRY (see longitudinal plan of care for additional care plan information)  Objective:  Lab Results  Component Value Date   HGBA1C 7.1 (A) 03/28/2020   HGBA1C 7.4 (A) 09/28/2019   HGBA1C 9.0 (A) 06/29/2019   Lab Results  Component Value Date   MICROALBUR 1.4 08/03/2014   LDLCALC 42 08/07/2017   CREATININE 0.96 04/13/2019       Current Barriers:  [name] Knowledge Deficits related to medications used for management of diabetes- returned call to patient, she apologized for not keeping the scheduled 06/01/20 telephone appointment with this CCM RN  says she had to work, she is asking if Cheryle Horsfall, the pharmacy technician has any follow up information regarding her application to the [place] Patient Assistance Program and she is also requesting  assistance with securing a primary care provider for her adult son.  Case Manager Clinical Goal(s):  Over the next 30-60 days, patient will demonstrate improved adherence to prescribed treatment plan for diabetes self care/management as evidenced by:  . daily monitoring and recording of CBG  . adherence to ADA/ carb modified diet . exercise at least 5 days/week . adherence to prescribed medication regimen  Interventions:  . Discussed plans with patient for ongoing care management follow up and provided patient with direct contact information for care management team . Collaborated with Yvonna Alanis, director of Internal Medicine Clinic, then advised patient that clinic is accepting new patients and her son should call the clinic at [phone] to schedule an initial appointment . Advised patient this CCM RN will contact Cheryle Horsfall, pharmacy technician, and ask her to follow up with patient about the Victoza Patient Assistance Program    Patient Self Care Activities:  . UNABLE to independently  get Hgb A1C under 7.0%  Please see past updates related to this goal by clicking on the "Past Updates" button in the selected goal      .  COMPLETED: "I need to find employment" (pt-stated)        Current Barriers:  [name] Knowledge Barriers related to resources available to assist with seeking employment.  Patient reports that she worked at [employer] for years but was laid off due to [place] 19 pandemic.  She has been on unemployment since being laid off.  Patient has been trying to find employment but keeps "getting the run around."  Patient states that she has difficulty using computers which makes it harder to find employment/submit application.  Patient did say that she utilizes computers at [employer] and intends to go there today to submit applications.   Update 04/21/20:  Patient has obtained part time employment at [employer] Goal(s):  [name] Over the next 90 days, patient will work with BSW to address needs related to finding employment.   . Over the next 90 days, BSW will collaborate with RN Care Manager to address care management and care coordination needs  Interventions:   . Closing goal of care plan as patient has secured employment  Patient Self Care Activities:  . Self administers medications as prescribed . Attends all scheduled provider appointments . Calls pharmacy for medication refills . Calls provider office for new concerns or questions  Please see past updates related to this  goal by clicking on the "Past Updates" button in the selected goal         Patient verbalizes understanding of instructions provided today.   The patient has been provided with contact information for the care management team and has been advised to call with any health/community resource related questions or concerns.    Ronn Melena, [facility] Coordination Social Worker [place] [phone]

## 2020-06-15 NOTE — Progress Notes (Signed)
Internal Medicine Clinic Resident  I have personally reviewed this encounter including the documentation in this note and/or discussed this patient with the care management provider. I will address any urgent items identified by the care management provider and will communicate my actions to the patient's PCP. I have reviewed the patient's CCM visit with my supervising attending, Dr Guilloud.  Emon Lance Y Merritt Mccravy, MD 06/15/2020    

## 2020-06-16 NOTE — Progress Notes (Signed)
Internal Medicine Clinic Attending  CCM services provided by the care management provider and their documentation were discussed with Dr. Chen. We reviewed the pertinent findings, urgent action items addressed by the resident and non-urgent items to be addressed by the PCP.  I agree with the assessment, diagnosis, and plan of care documented in the CCM and resident's note.  Katira Dumais, MD 06/16/2020 

## 2020-06-23 ENCOUNTER — Telehealth: Payer: Self-pay

## 2020-06-23 NOTE — Telephone Encounter (Signed)
SPOKE TO PT VIA PHONE.  [facility] PATIENT ASSISTANCE FOR [place] 10/27/20.  MEDS WILL BE DELIVERED TO [facility] IN 10-14 BUSINESS DAYS(APPROXIMATELY 07/13/20)

## 2020-07-04 ENCOUNTER — Other Ambulatory Visit: Payer: Self-pay | Admitting: *Deleted

## 2020-07-04 DIAGNOSIS — I10 Essential (primary) hypertension: Secondary | ICD-10-CM

## 2020-07-05 MED ORDER — HYDROCHLOROTHIAZIDE 25 MG PO TABS: ORAL_TABLET | ORAL | 1 refills | Status: DC

## 2020-07-06 ENCOUNTER — Telehealth: Payer: Self-pay

## 2020-07-06 NOTE — Telephone Encounter (Signed)
Called [facility] to check on shipping status of Victoza PAP today-per [facility] rep, fill was processed on 06/26/20 and shipment takes 10-14 business days.  Should receive meds by 09/17 or 09/20

## 2020-07-12 ENCOUNTER — Ambulatory Visit: Payer: Medicare HMO | Admitting: *Deleted

## 2020-07-12 DIAGNOSIS — E785 Hyperlipidemia, unspecified: Secondary | ICD-10-CM

## 2020-07-12 DIAGNOSIS — F331 Major depressive disorder, recurrent, moderate: Secondary | ICD-10-CM

## 2020-07-12 DIAGNOSIS — E1169 Type 2 diabetes mellitus with other specified complication: Secondary | ICD-10-CM

## 2020-07-12 DIAGNOSIS — I10 Essential (primary) hypertension: Secondary | ICD-10-CM

## 2020-07-12 DIAGNOSIS — E1149 Type 2 diabetes mellitus with other diabetic neurological complication: Secondary | ICD-10-CM

## 2020-07-12 NOTE — Chronic Care Management (AMB) (Signed)
Chronic Care Management   Follow Up Note   07/12/2020 Name: Sherri Gill MRN: [id number] DOB: 1950-12-03  Referred by: [name], MD Reason for referral : Chronic Care Management (NIDDM, HLD, HTN, MDD)   Sherri Gill is a 69 y.o. year old female who is a primary care patient of [name], MD. The CCM team was consulted for assistance with chronic disease management and care coordination needs.    Review of patient status, including review of consultants reports, relevant laboratory and other test results, and collaboration with appropriate care team members and the patient's provider was performed as part of comprehensive patient evaluation and provision of chronic care management services.    SDOH (Social Determinants of Health) assessments performed: No See Care Plan activities for detailed interventions related to [facility])     Outpatient Encounter Medications as of 07/12/2020  Medication Sig Note  . albuterol (PROVENTIL HFA;VENTOLIN HFA) 108 (90 Base) MCG/ACT inhaler Inhale 2 puffs every 6 (six) hours as needed into the lungs for wheezing or shortness of breath.   [name] amLODipine (NORVASC) 10 MG tablet Take by mouth.   [name] atorvastatin (LIPITOR) 20 MG tablet Take by mouth.   . Blood Glucose Monitoring Suppl (ONE TOUCH ULTRA MINI) w/Device KIT Please use as directed.   [name] buPROPion (WELLBUTRIN XL) 150 MG 24 hr tablet Take 1 tablet (150 mg total) by mouth daily.   . Cholecalciferol 1000 units capsule Take 1 capsule (1,000 Units total) by mouth daily.   . ferrous gluconate (FERGON) 324 MG tablet Take 1 tablet (324 mg total) by mouth daily.   . ferrous sulfate 325 (65 FE) MG tablet Take by mouth.   . fluticasone (FLONASE) 50 MCG/ACT nasal spray Place 2 sprays into both nostrils daily.   [name] glucose blood (ONE TOUCH ULTRA TEST) test strip Use as instructed   . glyBURIDE (DIABETA) 5 MG tablet Take by mouth. (Patient not taking: Reported on 05/19/2020) 05/19/2020: Patient  states she is no longer taking Glyburide  . hydrochlorothiazide (HYDRODIURIL) 25 MG tablet TAKE 1 TABLET DAILY WITH LOSARTAN   . Insulin Pen Needle 32G X 4 MM MISC 1 each by Does not apply route daily.   [name] losartan (COZAAR) 100 MG tablet Take 1 tablet by mouth once daily   . metFORMIN (GLUCOPHAGE) 1000 MG tablet Take 1 tablet (1,000 mg total) by mouth 2 (two) times daily with a meal.   . ONETOUCH DELICA LANCETS 32R MISC Please use as directed.   . venlafaxine XR (EFFEXOR-XR) 37.5 MG 24 hr capsule Take 1 capsule (37.5 mg total) by mouth daily with breakfast.   . VICTOZA 18 MG/3ML SOPN PLEASE START TAKING VICTOZA 0.6MG DAILY WEEK 1, THEN TAKE 1.2MG DAILY WEEK 2, AND THEN TAKE 1.8MG DAILY THEREAFTER    No facility-administered encounter medications on file as of 07/12/2020.     Objective:  Lab Results  Component Value Date   HGBA1C 7.1 (A) 03/28/2020   HGBA1C 7.4 (A) 09/28/2019   HGBA1C 9.0 (A) 06/29/2019   Lab Results  Component Value Date   MICROALBUR 1.4 08/03/2014   LDLCALC 42 08/07/2017   CREATININE 0.96 04/13/2019   Wt Readings from Last 3 Encounters:  05/11/20 196 lb 3.2 oz (89 kg)  03/28/20 201 lb 8 oz (91.4 kg)  09/28/19 195 lb 3.2 oz (88.5 kg)   BP Readings from Last 3 Encounters:  05/11/20 129/63  05/05/20 (!) 146/65  03/28/20 139/62    Goals Addressed  This Visit's Progress     Patient Stated   .  " I would like to get my A1C under 7.0%" (pt-stated)        CARE PLAN ENTRY (see longitudinal plan of care for additional care plan information)  Objective:  Lab Results  Component Value Date   HGBA1C 7.1 (A) 03/28/2020   HGBA1C 7.4 (A) 09/28/2019   HGBA1C 9.0 (A) 06/29/2019   Lab Results  Component Value Date   MICROALBUR 1.4 08/03/2014   LDLCALC 42 08/07/2017   CREATININE 0.96 04/13/2019       Current Barriers:  [name] Knowledge Deficits related to medications used for management of diabetes- patient left voice message for this CCM RN on  07/11/20 at 5:25 pm requesting return call from the pharmacy technician helping her with Victoza, she requests a return call after 2:30 pm today or anytime Thursday 9/16   Case Manager Clinical Goal(s):  Over the next 30-60 days, patient will demonstrate improved adherence to prescribed treatment plan for diabetes self care/management as evidenced by:  . daily monitoring and recording of CBG  . adherence to ADA/ carb modified diet . exercise at least 5 days/week . adherence to prescribed medication regimen  Interventions:  . Discussed plans with patient for ongoing care management follow up and provided patient with direct contact information for care management team . Messaged Cheryle Horsfall, pharmacy technician, and ask her to follow up with patient about the Victoza Patient Assistance Program after 2:30 pm today or anytime tomorrow   Patient Self Care Activities:  . UNABLE to independently get Hgb A1C under 7.0%  Please see past updates related to this goal by clicking on the "Past Updates" button in the selected goal          Plan:   The care management team will reach out to the patient again over the next 30-60 days.    Kelli Churn RN, CCM, [facility] RN Care Manager [phone]

## 2020-07-12 NOTE — Progress Notes (Signed)
Internal Medicine Clinic Resident  I have personally reviewed this encounter including the documentation in this note and/or discussed this patient with the care management provider. I will address any urgent items identified by the care management provider and will communicate my actions to the patient's PCP. I have reviewed the patient's CCM visit with my supervising attending, Dr Rebeca Alert.  Foy Guadalajara, MD 07/12/2020

## 2020-07-12 NOTE — Progress Notes (Signed)
Internal Medicine Clinic Attending  CCM services provided by the care management provider and their documentation were reviewed with Dr. Wynetta Emery.  We reviewed the pertinent findings, urgent action items addressed by the resident and non-urgent items to be addressed by the PCP.  I agree with the assessment, diagnosis, and plan of care documented in the CCM and resident's note.  Oda Kilts, MD 07/12/2020

## 2020-07-13 ENCOUNTER — Telehealth: Payer: Self-pay | Admitting: *Deleted

## 2020-07-13 ENCOUNTER — Telehealth: Payer: Medicare HMO

## 2020-07-13 NOTE — Telephone Encounter (Addendum)
  Chronic Care Management   Outreach Note  07/13/2020 Name: Sherri Gill MRN: [id number] DOB: September 30, 1951  Referred by: [name], MD Reason for referral : Chronic Care Management (NIDDM, HLD, HTN, MDD)   An unsuccessful telephone outreach was attempted today at this specific day and time in response to request left on this CCM RN's voice mail on 9/15. The patient was referred to the case management team for assistance with care management and care coordination. Unable to leave voice message as recording states voice mailbox has not been set up yet.  Follow Up Plan:  The care management team will reach out to the patient again over the next 7-10 days.   Kelli Churn RN, CCM, [facility] RN Care Manager [phone]

## 2020-07-14 ENCOUNTER — Telehealth: Payer: Self-pay | Admitting: *Deleted

## 2020-07-14 ENCOUNTER — Telehealth: Payer: Medicare HMO

## 2020-07-14 NOTE — Telephone Encounter (Signed)
  Chronic Care Management   Outreach Note  07/14/2020 Name: Sherri Gill MRN: [id number] DOB: 1951-07-17  Referred by: [name], MD Reason for referral : Chronic Care Management (NIDDM, HLD, HTN, Major Depressive Disorder)   A second unsuccessful telephone outreach was attempted today in attempt to return call to patient per her voice mail request of 07/12/20. The patient was referred to the case management team for assistance with care management and care coordination. Unable to leave message as recording states voice mailbox has not been set up yet.   Follow Up Plan: The care management team will reach out to the patient again over the next 7-10 days.   Kelli Churn RN, CCM, [facility] RN Care Manager [phone]

## 2020-07-17 ENCOUNTER — Ambulatory Visit: Payer: Medicare HMO | Admitting: *Deleted

## 2020-07-17 DIAGNOSIS — I10 Essential (primary) hypertension: Secondary | ICD-10-CM

## 2020-07-17 DIAGNOSIS — E1169 Type 2 diabetes mellitus with other specified complication: Secondary | ICD-10-CM

## 2020-07-17 DIAGNOSIS — F331 Major depressive disorder, recurrent, moderate: Secondary | ICD-10-CM

## 2020-07-17 DIAGNOSIS — E1149 Type 2 diabetes mellitus with other diabetic neurological complication: Secondary | ICD-10-CM

## 2020-07-17 NOTE — Chronic Care Management (AMB) (Signed)
Chronic Care Management   Follow Up Note   07/17/2020 Name: HONESTIE KULIK MRN: [id number] DOB: 1951/08/20  Referred by: [name], MD Reason for referral : Chronic Care Management (NIDDM, HLD, HTN, Major Depressive Disorder)   JOURNIEE FELDKAMP is a 69 y.o. year old female who is a primary care patient of [name], MD. The CCM team was consulted for assistance with chronic disease management and care coordination needs.    Review of patient status, including review of consultants reports, relevant laboratory and other test results, and collaboration with appropriate care team members and the patient's provider was performed as part of comprehensive patient evaluation and provision of chronic care management services.    SDOH (Social Determinants of Health) assessments performed: No See Care Plan activities for detailed interventions related to [facility])     Outpatient Encounter Medications as of 07/17/2020  Medication Sig Note  . albuterol (PROVENTIL HFA;VENTOLIN HFA) 108 (90 Base) MCG/ACT inhaler Inhale 2 puffs every 6 (six) hours as needed into the lungs for wheezing or shortness of breath.   [name] amLODipine (NORVASC) 10 MG tablet Take by mouth.   [name] atorvastatin (LIPITOR) 20 MG tablet Take by mouth.   . Blood Glucose Monitoring Suppl (ONE TOUCH ULTRA MINI) w/Device KIT Please use as directed.   [name] buPROPion (WELLBUTRIN XL) 150 MG 24 hr tablet Take 1 tablet (150 mg total) by mouth daily.   . Cholecalciferol 1000 units capsule Take 1 capsule (1,000 Units total) by mouth daily.   . ferrous gluconate (FERGON) 324 MG tablet Take 1 tablet (324 mg total) by mouth daily.   . ferrous sulfate 325 (65 FE) MG tablet Take by mouth.   . fluticasone (FLONASE) 50 MCG/ACT nasal spray Place 2 sprays into both nostrils daily.   [name] glucose blood (ONE TOUCH ULTRA TEST) test strip Use as instructed   . glyBURIDE (DIABETA) 5 MG tablet Take by mouth. (Patient not taking: Reported on 05/19/2020)  05/19/2020: Patient states she is no longer taking Glyburide  . hydrochlorothiazide (HYDRODIURIL) 25 MG tablet TAKE 1 TABLET DAILY WITH LOSARTAN   . Insulin Pen Needle 32G X 4 MM MISC 1 each by Does not apply route daily.   [name] losartan (COZAAR) 100 MG tablet Take 1 tablet by mouth once daily   . metFORMIN (GLUCOPHAGE) 1000 MG tablet Take 1 tablet (1,000 mg total) by mouth 2 (two) times daily with a meal.   . ONETOUCH DELICA LANCETS 62M MISC Please use as directed.   . venlafaxine XR (EFFEXOR-XR) 37.5 MG 24 hr capsule Take 1 capsule (37.5 mg total) by mouth daily with breakfast.   . VICTOZA 18 MG/3ML SOPN PLEASE START TAKING VICTOZA 0.6MG DAILY WEEK 1, THEN TAKE 1.2MG DAILY WEEK 2, AND THEN TAKE 1.8MG DAILY THEREAFTER    No facility-administered encounter medications on file as of 07/17/2020.     Objective:  Wt Readings from Last 3 Encounters:  05/11/20 196 lb 3.2 oz (89 kg)  03/28/20 201 lb 8 oz (91.4 kg)  09/28/19 195 lb 3.2 oz (88.5 kg)    Goals Addressed              This Visit's Progress     Patient Stated   .  " I would like to get my A1C under 7.0%" (pt-stated)        CARE PLAN ENTRY (see longitudinal plan of care for additional care plan information)  Objective:  Lab Results  Component Value Date  HGBA1C 7.1 (A) 03/28/2020   HGBA1C 7.4 (A) 09/28/2019   HGBA1C 9.0 (A) 06/29/2019   Lab Results  Component Value Date   MICROALBUR 1.4 08/03/2014   LDLCALC 42 08/07/2017   CREATININE 0.96 04/13/2019       Current Barriers:  [name] Knowledge Deficits related to medications used for management of diabetes- returned call to patient, she says her depression is much better and she is very happy to hear she was approved for assistance with Victoza, says she is still trying to arrange follow up medical care for her adult son with schizophrenia  Case Manager Clinical Goal(s):  Over the next 30-60 days, patient will demonstrate improved adherence to prescribed treatment plan for  diabetes self care/management as evidenced by:  . daily monitoring and recording of CBG  . adherence to ADA/ carb modified diet . exercise at least 5 days/week . adherence to prescribed medication regimen  Interventions:  . Discussed plans with patient for ongoing care management follow up and provided patient with direct contact information for care management team . Messaged Cheryle Horsfall, pharmacy technician, and ask her to follow up with patient about the Victoza Patient Assistance Program  . Received return response from Cheryle Horsfall, pharmacy technician, that patient will pick up Victoza at the clinic on 07/19/20; Claiborne Billings states she also provided patient with the phone number for the [facility] and Wellness clinic so she can arrange for her son to seen there   Patient Self Care Activities:  . UNABLE to independently get Hgb A1C under 7.0%  Please see past updates related to this goal by clicking on the "Past Updates" button in the selected goal      .  COMPLETED: "I cannot afford my blood pressure medication" (pt-stated)        Current Barriers:  [name] Knowledge Barriers related to resources and support available to address needs related to Medication procurement- patient states she is not struggling with affording her blood pressure medicine now that she is working part time at [employer]):  [name] Over the next 30 days, patient will work with BSW to address needs related to Medication procurement . Over the next 30 days, BSW will collaborate with RN Care Manager to address care management and care coordination needs  Interventions:  . Patient interviewed and appropriate assessments performed . Referral submitted to Pharmacy Technician, Cheryle Horsfall, for medication assistance.  Nash Dimmer with RN Care Manager and patient to establish an individualized plan of care   Patient Self Care Activities:  . Self administers medications as prescribed . Calls  provider office for new concerns or questions  Please see past updates related to this goal by clicking on the "Past Updates" button in the selected goal          Plan:   The care management team will reach out to the patient again over the next 30-60 days.    Kelli Churn RN, CCM, [facility] RN Care Manager [phone]

## 2020-07-17 NOTE — Patient Instructions (Signed)
Visit Information It was nice speaking with you today. Goals Addressed              This Visit's Progress     Patient Stated   .  " I would like to get my A1C under 7.0%" (pt-stated)        CARE PLAN ENTRY (see longitudinal plan of care for additional care plan information)  Objective:  Lab Results  Component Value Date   HGBA1C 7.1 (A) 03/28/2020   HGBA1C 7.4 (A) 09/28/2019   HGBA1C 9.0 (A) 06/29/2019   Lab Results  Component Value Date   MICROALBUR 1.4 08/03/2014   LDLCALC 42 08/07/2017   CREATININE 0.96 04/13/2019       Current Barriers:  [name] Knowledge Deficits related to medications used for management of diabetes- returned call to patient, she says her depression is much better and she is very happy to hear she was approved for assistance with Victoza, says she is still trying to arrange follow up medical care for her adult son with schizophrenia  Case Manager Clinical Goal(s):  Over the next 30-60 days, patient will demonstrate improved adherence to prescribed treatment plan for diabetes self care/management as evidenced by:  . daily monitoring and recording of CBG  . adherence to ADA/ carb modified diet . exercise at least 5 days/week . adherence to prescribed medication regimen  Interventions:  . Discussed plans with patient for ongoing care management follow up and provided patient with direct contact information for care management team . Messaged Cheryle Horsfall, pharmacy technician, and ask her to follow up with patient about the Victoza Patient Assistance Program  . Received return response from Cheryle Horsfall, pharmacy technician, that patient will pick up Victoza at the clinic on 07/19/20; Claiborne Billings states she also provided patient with the phone number for the [facility] and Wellness clinic so she can arrange for her son to seen there   Patient Self Care Activities:  . UNABLE to independently get Hgb A1C under 7.0%  Please see past updates related to this  goal by clicking on the "Past Updates" button in the selected goal      .  COMPLETED: "I cannot afford my blood pressure medication" (pt-stated)        Current Barriers:  [name] Knowledge Barriers related to resources and support available to address needs related to Medication procurement- patient states she is not struggling with affording her blood pressure medicine now that she is working part time at [employer]):  [name] Over the next 30 days, patient will work with BSW to address needs related to Medication procurement . Over the next 30 days, BSW will collaborate with RN Care Manager to address care management and care coordination needs  Interventions:  . Patient interviewed and appropriate assessments performed . Referral submitted to Pharmacy Technician, Cheryle Horsfall, for medication assistance.  Nash Dimmer with RN Care Manager and patient to establish an individualized plan of care   Patient Self Care Activities:  . Self administers medications as prescribed . Calls provider office for new concerns or questions  Please see past updates related to this goal by clicking on the "Past Updates" button in the selected goal         The patient verbalized understanding of instructions provided today and declined a print copy of patient instruction materials.   The care management team will reach out to the patient again over the next 30-60 days.   Kelli Churn RN,  CCM, [facility] RN Care Manager [phone]

## 2020-07-19 ENCOUNTER — Telehealth: Payer: Medicare HMO

## 2020-07-20 ENCOUNTER — Ambulatory Visit: Payer: Medicare HMO | Admitting: *Deleted

## 2020-07-20 DIAGNOSIS — E1149 Type 2 diabetes mellitus with other diabetic neurological complication: Secondary | ICD-10-CM

## 2020-07-20 DIAGNOSIS — E1169 Type 2 diabetes mellitus with other specified complication: Secondary | ICD-10-CM

## 2020-07-20 DIAGNOSIS — F331 Major depressive disorder, recurrent, moderate: Secondary | ICD-10-CM

## 2020-07-20 DIAGNOSIS — E785 Hyperlipidemia, unspecified: Secondary | ICD-10-CM

## 2020-07-20 DIAGNOSIS — I10 Essential (primary) hypertension: Secondary | ICD-10-CM

## 2020-07-20 NOTE — Chronic Care Management (AMB) (Addendum)
Chronic Care Management   Follow Up Note   07/20/2020 Name: Sherri Gill MRN: [id number] DOB: [birth year]  Referred by: [name], MD Reason for referral : Chronic Care Management (NIDDM, HLD, HTN, Major Depressive Disorder)   Sherri Gill is a 69 y.o. year old female who is a primary care patient of [name], MD. The CCM team was consulted for assistance with chronic disease management and care coordination needs.    Review of patient status, including review of consultants reports, relevant laboratory and other test results, and collaboration with appropriate care team members and the patient's provider was performed as part of comprehensive patient evaluation and provision of chronic care management services.    SDOH (Social Determinants of Health) assessments performed: No See Care Plan activities for detailed interventions related to [facility])     Outpatient Encounter Medications as of 07/20/2020  Medication Sig Note  . atorvastatin (LIPITOR) 20 MG tablet Take by mouth. 07/20/2020: Patient is requesting refill  . albuterol (PROVENTIL HFA;VENTOLIN HFA) 108 (90 Base) MCG/ACT inhaler Inhale 2 puffs every 6 (six) hours as needed into the lungs for wheezing or shortness of breath.   [name] amLODipine (NORVASC) 10 MG tablet Take by mouth.   . Blood Glucose Monitoring Suppl (ONE TOUCH ULTRA MINI) w/Device KIT Please use as directed.   [name] buPROPion (WELLBUTRIN XL) 150 MG 24 hr tablet Take 1 tablet (150 mg total) by mouth daily.   . Cholecalciferol 1000 units capsule Take 1 capsule (1,000 Units total) by mouth daily.   . ferrous gluconate (FERGON) 324 MG tablet Take 1 tablet (324 mg total) by mouth daily.   . ferrous sulfate 325 (65 FE) MG tablet Take by mouth.   . fluticasone (FLONASE) 50 MCG/ACT nasal spray Place 2 sprays into both nostrils daily.   [name] glucose blood (ONE TOUCH ULTRA TEST) test strip Use as instructed   . glyBURIDE (DIABETA) 5 MG tablet Take by mouth.  (Patient not taking: Reported on 05/19/2020) 05/19/2020: Patient states she is no longer taking Glyburide  . hydrochlorothiazide (HYDRODIURIL) 25 MG tablet TAKE 1 TABLET DAILY WITH LOSARTAN   . Insulin Pen Needle 32G X 4 MM MISC 1 each by Does not apply route daily.   [name] losartan (COZAAR) 100 MG tablet Take 1 tablet by mouth once daily   . metFORMIN (GLUCOPHAGE) 1000 MG tablet Take 1 tablet (1,000 mg total) by mouth 2 (two) times daily with a meal.   . ONETOUCH DELICA LANCETS 34V MISC Please use as directed.   . venlafaxine XR (EFFEXOR-XR) 37.5 MG 24 hr capsule Take 1 capsule (37.5 mg total) by mouth daily with breakfast.   . VICTOZA 18 MG/3ML SOPN PLEASE START TAKING VICTOZA 0.6MG DAILY WEEK 1, THEN TAKE 1.2MG DAILY WEEK 2, AND THEN TAKE 1.8MG DAILY THEREAFTER    No facility-administered encounter medications on file as of 07/20/2020.     Objective:  Wt Readings from Last 3 Encounters:  05/11/20 196 lb 3.2 oz (89 kg)  03/28/20 201 lb 8 oz (91.4 kg)  09/28/19 195 lb 3.2 oz (88.5 kg)   BP Readings from Last 3 Encounters:  05/11/20 129/63  05/05/20 (!) 146/65  03/28/20 139/62    Goals Addressed              This Visit's Progress     Patient Stated   .  " I would like to get my A1C under 7.0%" (pt-stated)        CARE PLAN  ENTRY (see longitudinal plan of care for additional care plan information)  Objective:  Lab Results  Component Value Date   HGBA1C 7.1 (A) 03/28/2020   HGBA1C 7.4 (A) 09/28/2019   HGBA1C 9.0 (A) 06/29/2019   Lab Results  Component Value Date   MICROALBUR 1.4 08/03/2014   LDLCALC 42 08/07/2017   CREATININE 0.96 04/13/2019       Current Barriers:  [name] Knowledge Deficits related to medications used for management of diabetes- returned call to patient, states she will stop by clinic today to pick up Victoza  . Case Manager Clinical Goal(s):  Over the next 30-60 days, patient will demonstrate improved adherence to prescribed treatment plan for diabetes self  care/management as evidenced by:  . daily monitoring and recording of CBG  . adherence to ADA/ carb modified diet . exercise at least 5 days/week . adherence to prescribed medication regimen  Interventions:  . Discussed plans with patient for ongoing care management follow up and provided patient with direct contact information for care management team . [facility] triage nurse Higinio Roger and clinic pharmacy technician Cheryle Horsfall that patient will pick up Victoza at the clinic today   Patient Self Care Activities:  . UNABLE to independently get Hgb A1C under 7.0%  Please see past updates related to this goal by clicking on the "Past Updates" button in the selected goal        Other   .  LDL CALC < 100        Lab Results  Component Value Date   CHOL 140 08/07/2017   HDL 62 08/07/2017   LDLCALC 42 08/07/2017   TRIG 179 (H) 08/07/2017   CHOLHDL 2.3 08/07/2017   Patient requesting refill on Atorvastatin        Plan:   The care management team will reach out to the patient again over the next 30-60 days.    Kelli Churn RN, CCM, [facility] RN Care Manager [phone]

## 2020-07-21 ENCOUNTER — Other Ambulatory Visit: Payer: Self-pay

## 2020-07-22 MED ORDER — ATORVASTATIN CALCIUM 20 MG PO TABS: 20.0000 mg | ORAL_TABLET | Freq: Every day | ORAL | 2 refills | Status: DC

## 2020-07-24 ENCOUNTER — Other Ambulatory Visit: Payer: Self-pay | Admitting: Internal Medicine

## 2020-07-24 NOTE — Progress Notes (Signed)
Reviewed CM note.  I will discontinue the glipizide as she is no longer taking it.  Refill requests noted, will complete.

## 2020-08-17 ENCOUNTER — Telehealth: Payer: Self-pay | Admitting: *Deleted

## 2020-08-17 ENCOUNTER — Telehealth: Payer: Medicare HMO

## 2020-08-17 NOTE — Telephone Encounter (Addendum)
  Chronic Care Management   Outreach Note  08/17/2020 Name: Sherri Gill MRN: [id number] DOB: May 03, 1951  Referred by: [name], MD Reason for referral : Chronic Care Management (NIDDM, HLD, HTN, Major Depressive Disorder)   An unsuccessful telephone outreach was attempted today. The patient was referred to the case management team for assistance with care management and care coordination.   Follow Up Plan: Unable to leave phone message as recording states "voice mail box has not been set up yet."  The care management team will reach out to the patient again over the next 7-10 days.   Kelli Churn RN, CCM, [facility] RN Care Manager [phone]

## 2020-08-24 ENCOUNTER — Telehealth: Payer: Self-pay | Admitting: Student

## 2020-08-24 ENCOUNTER — Ambulatory Visit: Payer: Medicare HMO | Admitting: *Deleted

## 2020-08-24 ENCOUNTER — Other Ambulatory Visit: Payer: Self-pay | Admitting: Student

## 2020-08-24 DIAGNOSIS — E1169 Type 2 diabetes mellitus with other specified complication: Secondary | ICD-10-CM

## 2020-08-24 DIAGNOSIS — E1149 Type 2 diabetes mellitus with other diabetic neurological complication: Secondary | ICD-10-CM

## 2020-08-24 DIAGNOSIS — I10 Essential (primary) hypertension: Secondary | ICD-10-CM

## 2020-08-24 DIAGNOSIS — F331 Major depressive disorder, recurrent, moderate: Secondary | ICD-10-CM

## 2020-08-24 MED ORDER — AMLODIPINE BESYLATE 10 MG PO TABS: 10.0000 mg | ORAL_TABLET | Freq: Every day | ORAL | 2 refills | Status: DC

## 2020-08-24 NOTE — Chronic Care Management (AMB) (Signed)
Chronic Care Management   Follow Up Note   08/24/2020 Name: Sherri Gill MRN: [id number] DOB: Jul 29, 1951  Referred by: [name], MD Reason for referral : Chronic Care Management (NIDDM, HLD, HTN, Major Depressive Disorder)   Sherri Gill is a 69 y.o. year old female who is a primary care patient of [name], MD. The CCM team was consulted for assistance with chronic disease management and care coordination needs.    Review of patient status, including review of consultants reports, relevant laboratory and other test results, and collaboration with appropriate care team members and the patient's provider was performed as part of comprehensive patient evaluation and provision of chronic care management services.    SDOH (Social Determinants of Health) assessments performed: No See Care Plan activities for detailed interventions related to [facility])    Outpatient Encounter Medications as of 08/24/2020  Medication Sig Note  . amLODipine (NORVASC) 10 MG tablet Take by mouth. 08/24/2020: Needs refill called to [facility] in [place], [place]  . albuterol (PROVENTIL HFA;VENTOLIN HFA) 108 (90 Base) MCG/ACT inhaler Inhale 2 puffs every 6 (six) hours as needed into the lungs for wheezing or shortness of breath.   [name] atorvastatin (LIPITOR) 20 MG tablet Take 1 tablet (20 mg total) by mouth daily.   . Blood Glucose Monitoring Suppl (ONE TOUCH ULTRA MINI) w/Device KIT Please use as directed.   [name] buPROPion (WELLBUTRIN XL) 150 MG 24 hr tablet Take 1 tablet (150 mg total) by mouth daily.   . Cholecalciferol 1000 units capsule Take 1 capsule (1,000 Units total) by mouth daily.   . ferrous gluconate (FERGON) 324 MG tablet Take 1 tablet (324 mg total) by mouth daily.   . ferrous sulfate 325 (65 FE) MG tablet Take by mouth.   . fluticasone (FLONASE) 50 MCG/ACT nasal spray Place 2 sprays into both nostrils daily.   [name] glucose blood (ONE TOUCH ULTRA TEST) test strip Use as instructed   .  hydrochlorothiazide (HYDRODIURIL) 25 MG tablet TAKE 1 TABLET DAILY WITH LOSARTAN   . Insulin Pen Needle 32G X 4 MM MISC 1 each by Does not apply route daily.   [name] losartan (COZAAR) 100 MG tablet Take 1 tablet by mouth once daily   . metFORMIN (GLUCOPHAGE) 1000 MG tablet Take 1 tablet (1,000 mg total) by mouth 2 (two) times daily with a meal.   . ONETOUCH DELICA LANCETS 40J MISC Please use as directed.   . venlafaxine XR (EFFEXOR-XR) 37.5 MG 24 hr capsule Take 1 capsule (37.5 mg total) by mouth daily with breakfast.   . VICTOZA 18 MG/3ML SOPN PLEASE START TAKING VICTOZA 0.6MG DAILY WEEK 1, THEN TAKE 1.2MG DAILY WEEK 2, AND THEN TAKE 1.8MG DAILY THEREAFTER    No facility-administered encounter medications on file as of 08/24/2020.    Objective:  BP Readings from Last 3 Encounters:  05/11/20 129/63  05/05/20 (!) 146/65  03/28/20 139/62   Wt Readings from Last 3 Encounters:  05/11/20 196 lb 3.2 oz (89 kg)  03/28/20 201 lb 8 oz (91.4 kg)  09/28/19 195 lb 3.2 oz (88.5 kg)    Goals Addressed              This Visit's Progress     Patient Stated   .  " I've been in [place] [employer] for 3 weeks taking  care of my 15 year old brother as he recovers from Covid and I don't know when I will be back home. Can you ask my doctor  to call a refill of my amlodipine to the [facility] in [place], [place]?" (pt-stated)        [place] (see longitudinal plan of care for additional care plan information)  Current Barriers:  . Difficulty obtaining medications while patient is out of town caring for her brother  Nurse Case Manager Clinical Goal(s):  [name] Over the next 1-2 days, patient will verbalize understanding of plan for CCM RN to contact clinic provider regarding need for amlodipine refill  to out of town pharmacy.  Interventions:  . Inter-disciplinary care team collaboration (see longitudinal plan of care) . Collaborated with clinic provider regarding amlodipine refills to [facility] in [place],  [place]  Patient Self Care Activities:  . Patient verbalizes understanding of plan for CCM RN to assist with amlodipine refill while she is out of town caring for her brother.  . Unable to independently secure medication refill while out of town.  Initial goal documentation         Plan:   The care management team will reach out to the patient again over the next 30-60 days.   Kelli Churn RN, CCM, [facility] RN Care Manager [phone]

## 2020-08-24 NOTE — Patient Instructions (Signed)
Visit Information It was nice speaking with you today. I will ask your doctor to call in a refill for you amlodipine[ine to the [facility] in [place], [place].  Goals Addressed              This Visit's Progress     Patient Stated   .  " I've been in [place] for 3 weeks taking  care of my 67 year old brother as he recovers from Covid and I don't know when I will be back home. Can you ask my doctor to call a refill of my amlodipine to the [facility] in [place], [place]?" (pt-stated)        [place] (see longitudinal plan of care for additional care plan information)  Current Barriers:  . Difficulty obtaining medications while patient is out of town caring for her brother  Nurse Case Manager Clinical Goal(s):  [name] Over the next 1-2 days, patient will verbalize understanding of plan for CCM RN to contact clinic provider regarding need for amlodipine refill  to out of town pharmacy.  Interventions:  . Inter-disciplinary care team collaboration (see longitudinal plan of care) . Collaborated with clinic provider regarding amlodipine refills to [facility] in [place], [place]  Patient Self Care Activities:  . Patient verbalizes understanding of plan for CCM RN to assist with amlodipine refill while she is out of town caring for her brother.  . Unable to independently secure medication refill while out of town.  Initial goal documentation        The patient verbalized understanding of instructions provided today and declined a print copy of patient instruction materials.   The care management team will reach out to the patient again over the next 30-60 days.   Kelli Churn RN, CCM, [facility] RN Care Manager [phone]

## 2020-08-24 NOTE — Progress Notes (Signed)
Attending attestation: Encounter has been reviewed and approved. Dorian Pod, MD

## 2020-08-24 NOTE — Progress Notes (Signed)
Internal Medicine Clinic Resident  I have personally reviewed this encounter including the documentation in this note and/or discussed this patient with the care management provider. I will address any urgent items identified by the care management provider and will communicate my actions to the patient's PCP. I have reviewed the patient's CCM visit with my supervising attending, Dr. [name].  Jeralyn Bennett, MD 08/24/2020 [phone]

## 2020-08-24 NOTE — Telephone Encounter (Signed)
Spoke with patient and she was able to pick up her amlodipine 10mg  prescription from her [facility].   Jeralyn Bennett, PGY1 Internal mediine [phone]

## 2020-08-24 NOTE — Telephone Encounter (Signed)
Opened in error

## 2020-08-25 ENCOUNTER — Ambulatory Visit: Payer: Medicare HMO | Admitting: *Deleted

## 2020-08-25 DIAGNOSIS — E1169 Type 2 diabetes mellitus with other specified complication: Secondary | ICD-10-CM

## 2020-08-25 DIAGNOSIS — E1149 Type 2 diabetes mellitus with other diabetic neurological complication: Secondary | ICD-10-CM

## 2020-08-25 DIAGNOSIS — I10 Essential (primary) hypertension: Secondary | ICD-10-CM

## 2020-08-25 NOTE — Progress Notes (Signed)
Internal Medicine Clinic Resident  I have personally reviewed this encounter including the documentation in this note and/or discussed this patient with the care management provider. I will address any urgent items identified by the care management provider and will communicate my actions to the patient's PCP. I have reviewed the patient's CCM visit with my supervising attending, Dr [name].  Mosetta Anis, MD 08/25/2020

## 2020-08-25 NOTE — Chronic Care Management (AMB) (Signed)
Chronic Care Management   Follow Up Note   08/25/2020 Name: Sherri Gill MRN: [id number] DOB: 1951-06-03  Referred by: Sherri Pou, MD Reason for referral : Chronic Care Management ( NIDDM, HLD, HTN, Major Depressive Disorder)   Sherri Gill is a 69 y.o. year old female who is a primary care patient of Sherri Pou, MD. The CCM team was consulted for assistance with chronic disease management and care coordination needs.    Review of patient status, including review of consultants reports, relevant laboratory and other test results, and collaboration with appropriate care team members and the patient's provider was performed as part of comprehensive patient evaluation and provision of chronic care management services.    SDOH (Social Determinants of Health) assessments performed: No See Care Plan activities for detailed interventions related to [facility])     Outpatient Encounter Medications as of 08/25/2020  Medication Sig  . albuterol (PROVENTIL HFA;VENTOLIN HFA) 108 (90 Base) MCG/ACT inhaler Inhale 2 puffs every 6 (six) hours as needed into the lungs for wheezing or shortness of breath.  [name] amLODipine (NORVASC) 10 MG tablet Take 1 tablet (10 mg total) by mouth daily.  [name] atorvastatin (LIPITOR) 20 MG tablet Take 1 tablet (20 mg total) by mouth daily.  . Blood Glucose Monitoring Suppl (ONE TOUCH ULTRA MINI) w/Device KIT Please use as directed.  [name] buPROPion (WELLBUTRIN XL) 150 MG 24 hr tablet Take 1 tablet (150 mg total) by mouth daily.  . Cholecalciferol 1000 units capsule Take 1 capsule (1,000 Units total) by mouth daily.  . ferrous gluconate (FERGON) 324 MG tablet Take 1 tablet (324 mg total) by mouth daily.  . ferrous sulfate 325 (65 FE) MG tablet Take by mouth.  . fluticasone (FLONASE) 50 MCG/ACT nasal spray Place 2 sprays into both nostrils daily.  [name] glucose blood (ONE TOUCH ULTRA TEST) test strip Use as instructed  . hydrochlorothiazide (HYDRODIURIL) 25 MG  tablet TAKE 1 TABLET DAILY WITH LOSARTAN  . Insulin Pen Needle 32G X 4 MM MISC 1 each by Does not apply route daily.  [name] losartan (COZAAR) 100 MG tablet Take 1 tablet by mouth once daily  . metFORMIN (GLUCOPHAGE) 1000 MG tablet Take 1 tablet (1,000 mg total) by mouth 2 (two) times daily with a meal.  . ONETOUCH DELICA LANCETS 15V MISC Please use as directed.  . venlafaxine XR (EFFEXOR-XR) 37.5 MG 24 hr capsule Take 1 capsule (37.5 mg total) by mouth daily with breakfast.  . VICTOZA 18 MG/3ML SOPN PLEASE START TAKING VICTOZA 0.6MG DAILY WEEK 1, THEN TAKE 1.2MG DAILY WEEK 2, AND THEN TAKE 1.8MG DAILY THEREAFTER   No facility-administered encounter medications on file as of 08/25/2020.     Objective:  BP Readings from Last 3 Encounters:  05/11/20 129/63  05/05/20 (!) 146/65  03/28/20 139/62   Wt Readings from Last 3 Encounters:  05/11/20 196 lb 3.2 oz (89 kg)  03/28/20 201 lb 8 oz (91.4 kg)  09/28/19 195 lb 3.2 oz (88.5 kg)   Lab Results  Component Value Date   HGBA1C 7.1 (A) 03/28/2020   HGBA1C 7.4 (A) 09/28/2019   HGBA1C 9.0 (A) 06/29/2019   Lab Results  Component Value Date   MICROALBUR 1.4 08/03/2014   LDLCALC 42 08/07/2017   CREATININE 0.96 04/13/2019   Lab Results  Component Value Date   CHOL 140 08/07/2017   HDL 62 08/07/2017   LDLCALC 42 08/07/2017   TRIG 179 (H) 08/07/2017   CHOLHDL 2.3 08/07/2017  Please  consider updating lipid profile, patient on statin therapy and has DM.   Goals Addressed              This Visit's Progress     Patient Stated   .  " I've been in [place] for 3 weeks taking  care of my 68 year old brother as he recovers from Covid and I don't know when I will be back home. Can you ask my doctor to call a refill of my amlodipine to the [facility] in [place], [place]?" (pt-stated)        [place] (see longitudinal plan of care for additional care plan information)  Current Barriers:  . Difficulty obtaining medications while patient is out  of town caring for her brother- patient says she never took  10 mg of amlodipine even thought the Rx was changed from 5 mg to 10 mg on 03/31/20 . Patient says she was never told of the dosage change by Dr Maricela Bo. She says she didn't need a refill after the original 5 mg Rx was filled in mid May 2021 because she had amlodipine left over from her previous Rx and her self monitored blood pressures have been meeting target on the 5 mg. She says he does not want to take the 10 mg without directions to do so from her primary care provider, Dr Sherri Gill. She says she is cutting the 10 mg amlodipine pills in 1/2 but they are not scored and "They crumble and I lose most of the pill. It's a mess."  Nurse Case Manager Clinical Goal(s):  [name] Over the next 1-2 days, patient will verbalize understanding of plan for CCM RN to contact clinic provider regarding need for amlodipine refill  to out of town pharmacy.  Interventions:  . Inter-disciplinary care team collaboration (see longitudinal plan of care) . 10/28 Collaborated with clinic provider regarding amlodipine refills to [facility] in [place], [place] . 10/28 Messaged Dr Sherri Gill , patient's primary care provider, about 10 mg Amlodipine refill when she has been taking 5 mg  . 10/29 Advised patient that Dr Sherri Gill was not in clinic today, 10/29 , so no response to message sent to her yesterday  . Informed patient this CCM RN will contact patient once response received from Dr. Jimmye Gill  Patient Self Care Activities:  . Patient verbalizes understanding of plan for CCM RN to assist with amlodipine refill while she is out of town caring for her brother.  . Unable to independently secure medication refill while out of town.  Please see past updates related to this goal by clicking on the "Past Updates" button in the selected goal          Plan:   The care management team will reach out to the patient again over the next 7 days.    Kelli Churn RN, CCM,  [facility] RN Care Manager [phone]

## 2020-08-28 NOTE — Progress Notes (Signed)
Sherri Gill, I apologize that I am just now seeing your message.  Given her concern and without any objective readings, it would be safest for her to cut the 10 mg tabs in half and just take 5 mg daily until we can recheck her.   Thank you

## 2020-08-29 ENCOUNTER — Telehealth: Payer: Self-pay | Admitting: *Deleted

## 2020-08-29 NOTE — Telephone Encounter (Signed)
  Chronic Care Management   Outreach Note  08/29/2020 Name: Sherri Gill MRN: [id number] DOB: 1951/02/19  Referred by: [name], MD Reason for referral : Chronic Care Management (NIDDM, HLD, HTN, Major Depressive Disorder)   An unsuccessful telephone outreach was attempted today. The purpose for the call was to advise patient that Dr [name] agrees that patient should continue taking 5 mg of amlodipine and continue to monitor her blood pressure to ensure she is meeting treatment targets. Unable to leave message as recording on patient's contact number states voice mail box has not been set up.  The patient was referred to the case management team for assistance with care management and care coordination.   Follow Up Plan: The care management team will reach out to the patient again over the next 7-10 days.   Kelli Churn RN, CCM, [facility] RN Care Manager [phone]

## 2020-08-31 ENCOUNTER — Telehealth: Payer: Medicare HMO

## 2020-08-31 ENCOUNTER — Telehealth: Payer: Self-pay | Admitting: *Deleted

## 2020-08-31 NOTE — Telephone Encounter (Signed)
°  Chronic Care Management   Outreach Note  08/31/2020 Name: Sherri Gill MRN: [id number] DOB: 03/08/1951  Referred by: [name], MD Reason for referral : Chronic Care Management (NIDDM, HLD, HTN, Major Depressive Disorder)   A second unsuccessful telephone outreach was attempted today in order to advise patient that Dr [name] agrees patient should stay on 5 mg of amlodipine and continue to self monitor her blood pressure and record readings. Unable to leave message as recording states her voice mail box has not been set up  The patient was referred to the case management team for assistance with care management and care coordination.   Follow Up Plan: The care management team will reach out to the patient again over the next 7-14 days.   Kelli Churn RN, CCM, [facility] RN Care Manager [phone]

## 2020-09-01 ENCOUNTER — Telehealth: Payer: Medicare HMO

## 2020-09-14 ENCOUNTER — Telehealth: Payer: Medicare HMO

## 2020-09-28 ENCOUNTER — Telehealth: Payer: Self-pay | Admitting: *Deleted

## 2020-09-28 ENCOUNTER — Telehealth: Payer: Medicare HMO

## 2020-09-28 NOTE — Telephone Encounter (Signed)
°  Chronic Care Management   Outreach Note  09/28/2020 Name: Sherri Gill MRN: [id number] DOB: 05-30-1951  Referred by: [name], MD Reason for referral : Chronic Care Management (NIDDM, HLD, HTN, Major Depressive Disorder)   Third unsuccessful telephone outreach was attempted today. Unable to leave message as recording states her voice mail box has not been set up. The patient was referred to the case management team for assistance with care management and care coordination.   Follow Up Plan: The care management team will reach out to the patient again over the next 7-14 days.   Kelli Churn RN, CCM, [facility] RN Care Manager [phone]

## 2020-10-12 ENCOUNTER — Ambulatory Visit: Payer: Medicare HMO | Admitting: *Deleted

## 2020-10-12 DIAGNOSIS — E1169 Type 2 diabetes mellitus with other specified complication: Secondary | ICD-10-CM

## 2020-10-12 DIAGNOSIS — E1149 Type 2 diabetes mellitus with other diabetic neurological complication: Secondary | ICD-10-CM

## 2020-10-12 DIAGNOSIS — F331 Major depressive disorder, recurrent, moderate: Secondary | ICD-10-CM

## 2020-10-12 DIAGNOSIS — I10 Essential (primary) hypertension: Secondary | ICD-10-CM

## 2020-10-12 DIAGNOSIS — E785 Hyperlipidemia, unspecified: Secondary | ICD-10-CM

## 2020-10-12 NOTE — Patient Instructions (Signed)
Visit Information It was nice speaking with you today. Goals Addressed              This Visit's Progress     Patient Stated   .  " I would like to get my A1C under 7.0%" (pt-stated)        CARE PLAN ENTRY (see longitudinal plan of care for additional care plan information)  Objective:  Lab Results  Component Value Date   HGBA1C 7.1 (A) 03/28/2020   HGBA1C 7.4 (A) 09/28/2019   HGBA1C 9.0 (A) 06/29/2019   Lab Results  Component Value Date   MICROALBUR 1.4 08/03/2014   LDLCALC 42 08/07/2017   CREATININE 0.96 04/13/2019       Current Barriers:  [name] Knowledge Deficits related to medications used for management of diabetes- successful outreach to patient, she is now back home after spending more than a month near [place] for her brother who died in a nursing facility on 09/27/20, she says other than taking her medications she was not able to focus on her health when she was caring for her brother  . Case Manager Clinical Goal(s):  Over the next 30-60 days, patient will demonstrate improved adherence to prescribed treatment plan for diabetes self care/management as evidenced by:  . daily monitoring and recording of CBG  . adherence to ADA/ carb modified diet . exercise at least 5 days/week . adherence to prescribed medication regimen  Interventions:  . Discussed plans with patient for ongoing care management follow up and provided patient with direct contact information for care management team . Assessed patient's self management skills in regards to HTN, NIDDM, HLD, and depression . Evaluation of current treatment plan related to HTN, NIDDM, HLD, and depression and patient's adherence to plan as established by provider. . Reviewed medications with patient and assessed medication taking behavior;  if indicated, discussed importance of medication adherence  . Provided opportunity for patient to share her experience of caring for her brother until his death and emotional  support provided  . Encouraged patient to focus on the blessings in her life and to do something that brings her joy during the upcoming holiday season . Advised her the clinic now has a behavioral health counselor should she wish to resume counseling at the clinic.  [name] Reminded her of  clinic appointment for 11/09/20 and suggested she ask Dr [name] for referral to behavioral health counselor     Patient Self Care Activities:  . UNABLE to independently get Hgb A1C under 7.0%  Please see past updates related to this goal by clicking on the "Past Updates" button in the selected goal      .  COMPLETED: " I've been in [place] for 3 weeks taking  care of my 32 year old brother as he recovers from Covid and I don't know when I will be back home. Can you ask my doctor to call a refill of my amlodipine to the [facility] in [place], [place]?" (pt-stated)        [place] (see longitudinal plan of care for additional care plan information)  Current Barriers:  . Difficulty obtaining medications while patient is out of town caring for her brother- successful outreach to patient via phone to complete follow up assessment , she states she is taking 10 mg of amlodipine because when she tried to split the pill it disintegrated. She says she has not been self monitoring her blood pressures.   Nurse Case Manager Clinical Goal(s):  .  Over the next 1-2 days, patient will verbalize understanding of plan for CCM RN to contact clinic provider regarding need for amlodipine refill  to out of town pharmacy.  Interventions:  . Inter-disciplinary care team collaboration (see longitudinal plan of care) . 10/28 Collaborated with clinic provider regarding amlodipine refills to [facility] in [place], [place] . 10/28 Messaged Dr [name] , patient's primary care provider, about 10 mg Amlodipine refill when she has been taking 5 mg  . 10/29 Advised patient that Dr. [name] was not in clinic today, 10/29 , so no response to  message sent to her yesterday  . Informed patient this CCM RN will contact patient once response received from Dr. [name] . 11/2, 11/4, 11/18- Attempted to reach patient via phone to continue to take 1/2 of 10 mg Amlodipine per Dr. [name]' directions . 12/16-  Advised patient to check her blood pressure at least daily and record and bring reading and or her BP monitor to her clinic appointment on 11/09/20  Patient Self Care Activities:  . Patient verbalizes understanding of plan for CCM RN to assist with amlodipine refill while she is out of town caring for her brother.  . Unable to independently secure medication refill while out of town.  Please see past updates related to this goal by clicking on the "Past Updates" button in the selected goal        Other   .  Blood Pressure < 140/90        BP Readings from Last 3 Encounters:  05/11/20 129/63  05/05/20 (!) 146/65  03/28/20 139/62       .  HEMOGLOBIN A1C < 7        Lab Results  Component Value Date   HGBA1C 7.1 (A) 03/28/2020   HGBA1C 7.4 (A) 09/28/2019   HGBA1C 9.0 (A) 06/29/2019   Lab Results  Component Value Date   MICROALBUR 1.4 08/03/2014   LDLCALC 42 08/07/2017   CREATININE 0.96 04/13/2019       .  LDL CALC < 100        Lab Results  Component Value Date   CHOL 140 08/07/2017   HDL 62 08/07/2017   LDLCALC 42 08/07/2017   TRIG 179 (H) 08/07/2017   CHOLHDL 2.3 08/07/2017          The patient verbalized understanding of instructions, educational materials, and care plan provided today and declined offer to receive copy of patient instructions, educational materials, and care plan.   The care management team will reach out to the patient again over the next 30-60 days.   Kelli Churn RN, CCM, [facility] RN Care Manager [phone]

## 2020-10-12 NOTE — Chronic Care Management (AMB) (Signed)
Chronic Care Management   Follow Up Note   10/12/2020 Name: Sherri Gill MRN: [id number] DOB: [birth year]  Referred by: [name], MD Reason for referral : Chronic Care Management (NIDDM, HLD, HTN, Major Depressive Disorder)   Sherri Gill is a 69 y.o. year old female who is a primary care patient of [name], MD. The CCM team was consulted for assistance with chronic disease management and care coordination needs.    Review of patient status, including review of consultants reports, relevant laboratory and other test results, and collaboration with appropriate care team members and the patient's provider was performed as part of comprehensive patient evaluation and provision of chronic care management services.    SDOH (Social Determinants of Health) assessments performed: No See Care Plan activities for detailed interventions related to [name])     Outpatient Encounter Medications as of 10/12/2020  Medication Sig Note  . albuterol (PROVENTIL HFA;VENTOLIN HFA) 108 (90 Base) MCG/ACT inhaler Inhale 2 puffs every 6 (six) hours as needed into the lungs for wheezing or shortness of breath.   [name] amLODipine (NORVASC) 10 MG tablet Take 1 tablet (10 mg total) by mouth daily. 08/31/2020: Patient is taking 5 mg of amlodipine as she never took 10 mg but the most recent Rx was called in for 10 mg so she is cutting the pills in 1/2  . atorvastatin (LIPITOR) 20 MG tablet Take 1 tablet (20 mg total) by mouth daily.   . Blood Glucose Monitoring Suppl (ONE TOUCH ULTRA MINI) w/Device KIT Please use as directed.   [name] buPROPion (WELLBUTRIN XL) 150 MG 24 hr tablet Take 1 tablet (150 mg total) by mouth daily.   . Cholecalciferol 1000 units capsule Take 1 capsule (1,000 Units total) by mouth daily.   . ferrous gluconate (FERGON) 324 MG tablet Take 1 tablet (324 mg total) by mouth daily.   . ferrous sulfate 325 (65 FE) MG tablet Take by mouth.   . fluticasone (FLONASE) 50 MCG/ACT nasal  spray Place 2 sprays into both nostrils daily.   [name] glucose blood (ONE TOUCH ULTRA TEST) test strip Use as instructed   . hydrochlorothiazide (HYDRODIURIL) 25 MG tablet TAKE 1 TABLET DAILY WITH LOSARTAN   . Insulin Pen Needle 32G X 4 MM MISC 1 each by Does not apply route daily.   [name] losartan (COZAAR) 100 MG tablet Take 1 tablet by mouth once daily   . metFORMIN (GLUCOPHAGE) 1000 MG tablet Take 1 tablet (1,000 mg total) by mouth 2 (two) times daily with a meal.   . ONETOUCH DELICA LANCETS 96E MISC Please use as directed.   . venlafaxine XR (EFFEXOR-XR) 37.5 MG 24 hr capsule Take 1 capsule (37.5 mg total) by mouth daily with breakfast.   . VICTOZA 18 MG/3ML SOPN PLEASE START TAKING VICTOZA 0.6MG DAILY WEEK 1, THEN TAKE 1.2MG DAILY WEEK 2, AND THEN TAKE 1.8MG DAILY THEREAFTER    No facility-administered encounter medications on file as of 10/12/2020.     Objective:  Wt Readings from Last 3 Encounters:  05/11/20 196 lb 3.2 oz (89 kg)  03/28/20 201 lb 8 oz (91.4 kg)  09/28/19 195 lb 3.2 oz (88.5 kg)    Goals Addressed              This Visit's Progress     Patient Stated   .  " I would like to get my A1C under 7.0%" (pt-stated)        CARE PLAN ENTRY (see longitudinal  plan of care for additional care plan information)  Objective:  Lab Results  Component Value Date   HGBA1C 7.1 (A) 03/28/2020   HGBA1C 7.4 (A) 09/28/2019   HGBA1C 9.0 (A) 06/29/2019   Lab Results  Component Value Date   MICROALBUR 1.4 08/03/2014   LDLCALC 42 08/07/2017   CREATININE 0.96 04/13/2019       Current Barriers:  [name] Knowledge Deficits related to medications used for management of diabetes- successful outreach to patient, she is now back home after spending more than a month near [place] for her brother who died in a nursing facility on 28-Sep-2020, she says other than taking her medications she was not able to focus on her health when she was caring for her brother  . Case Manager Clinical  Goal(s):  Over the next 30-60 days, patient will demonstrate improved adherence to prescribed treatment plan for diabetes self care/management as evidenced by:  . daily monitoring and recording of CBG  . adherence to ADA/ carb modified diet . exercise at least 5 days/week . adherence to prescribed medication regimen  Interventions:  . Assessed patient's self management skills in regards to HTN, NIDDM, HLD, and depression . Evaluation of current treatment plan related to HTN, NIDDM, HLD, and depression and patient's adherence to plan as established by provider. . Reviewed medications with patient and assessed medication taking behavior;  if indicated, discussed importance of medication adherence  . Provided opportunity for patient to share her experience of caring for her brother until his death and emotional support provided . Encouraged patient to focus on the blessings in her life and to do something that brings her joy during the upcoming holiday season . Advised her the clinic now has a behavioral health counselor should she wish to resume counseling at the clinic.  [name] Reminded her of  clinic appointment for 11/09/20 and suggested she ask Dr [name] for referral to behavioral health counselor  . Discussed plans with patient for ongoing care management follow up and provided patient with direct contact information for care management team    Patient Self Care Activities:  . UNABLE to independently get Hgb A1C under 7.0%  Please see past updates related to this goal by clicking on the "Past Updates" button in the selected goal      .  COMPLETED: " I've been in [place] for 3 weeks taking  care of my 27 year old brother as he recovers from Covid and I don't know when I will be back home. Can you ask my doctor to call a refill of my amlodipine to the [facility] in [place], [place]?" (pt-stated)        [place] (see longitudinal plan of care for additional care plan information)  Current  Barriers:  . Difficulty obtaining medications while patient is out of town caring for her brother- successful outreach to patient via phone to complete follow up assessment , she states she is taking 10 mg of amlodipine because when she tried to split the pill it disintegrated. She says she has not been self monitoring her blood pressures.   Nurse Case Manager Clinical Goal(s):  [name] Over the next 1-2 days, patient will verbalize understanding of plan for CCM RN to contact clinic provider regarding need for amlodipine refill  to out of town pharmacy.  Interventions:  . Inter-disciplinary care team collaboration (see longitudinal plan of care) . 10/28 Collaborated with clinic provider regarding amlodipine refills to [facility] in [place], [place] . 10/28  Messaged Dr [name] , patient's primary care provider, about 10 mg Amlodipine refill when she has been taking 5 mg  . 10/29 Advised patient that Dr. [name] was not in clinic today, 10/29 , so no response to message sent to her yesterday  . Informed patient this CCM RN will contact patient once response received from Dr. [name] . 11/2, 11/4, 11/18- Attempted to reach patient via phone to continue to take 1/2 of 10 mg Amlodipine per Dr. [name]' directions . 12/16-  Advised patient to check her blood pressure at least daily and record and bring reading and or her BP monitor to her clinic appointment on 11/09/20  Patient Self Care Activities:  . Patient verbalizes understanding of plan for CCM RN to assist with amlodipine refill while she is out of town caring for her brother.  . Unable to independently secure medication refill while out of town.  Please see past updates related to this goal by clicking on the "Past Updates" button in the selected goal        Other   .  Blood Pressure < 140/90        BP Readings from Last 3 Encounters:  05/11/20 129/63  05/05/20 (!) 146/65  03/28/20 139/62       .  HEMOGLOBIN A1C < 7        Lab Results   Component Value Date   HGBA1C 7.1 (A) 03/28/2020   HGBA1C 7.4 (A) 09/28/2019   HGBA1C 9.0 (A) 06/29/2019   Lab Results  Component Value Date   MICROALBUR 1.4 08/03/2014   LDLCALC 42 08/07/2017   CREATININE 0.96 04/13/2019       .  LDL CALC < 100        Lab Results  Component Value Date   CHOL 140 08/07/2017   HDL 62 08/07/2017   LDLCALC 42 08/07/2017   TRIG 179 (H) 08/07/2017   CHOLHDL 2.3 08/07/2017      Please consider updating lipid profile.    Plan:   The care management team will reach out to the patient again over the next 30-60 days.    Kelli Churn RN, CCM, [facility] RN Care Manager [phone]

## 2020-10-13 NOTE — Progress Notes (Signed)
Internal Medicine Clinic Resident  I have personally reviewed this encounter including the documentation in this note and/or discussed this patient with the care management provider. I will address any urgent items identified by the care management provider and will communicate my actions to the patient's PCP. I have reviewed the patient's CCM visit with my supervising attending, Dr Raines.  Kecia Swoboda, MD 10/13/2020   

## 2020-10-16 NOTE — Progress Notes (Signed)
Internal Medicine Clinic Attending  CCM services provided by the care management provider and their documentation were reviewed with Dr. Shon Baton.  We reviewed the pertinent findings, urgent action items addressed by the resident and non-urgent items to be addressed by the PCP.  I agree with the assessment, diagnosis, and plan of care documented in the CCM and resident's note.  Oda Kilts, MD 10/16/2020

## 2020-10-17 ENCOUNTER — Other Ambulatory Visit (HOSPITAL_COMMUNITY)
Admission: RE | Admit: 2020-10-17 | Discharge: 2020-10-17 | Disposition: A | Discharge status: Home / Self Care | Payer: Medicare HMO | Source: Ambulatory Visit | Attending: Internal Medicine | Admitting: Internal Medicine

## 2020-10-17 ENCOUNTER — Encounter: Payer: Self-pay | Admitting: Internal Medicine

## 2020-10-17 ENCOUNTER — Ambulatory Visit (INDEPENDENT_AMBULATORY_CARE_PROVIDER_SITE_OTHER): Payer: Medicare HMO | Admitting: Internal Medicine

## 2020-10-17 VITALS — BP 170/62 | HR 88 | Temp 98.6°F | Ht 63.0 in | Wt 203.0 lb

## 2020-10-17 DIAGNOSIS — R3915 Urgency of urination: Secondary | ICD-10-CM | POA: Diagnosis not present

## 2020-10-17 DIAGNOSIS — R69 Illness, unspecified: Secondary | ICD-10-CM | POA: Diagnosis not present

## 2020-10-17 DIAGNOSIS — N3941 Urge incontinence: Secondary | ICD-10-CM | POA: Diagnosis not present

## 2020-10-17 DIAGNOSIS — Z113 Encounter for screening for infections with a predominantly sexual mode of transmission: Secondary | ICD-10-CM | POA: Insufficient documentation

## 2020-10-17 LAB — POCT URINALYSIS DIPSTICK
Bilirubin, UA: NEGATIVE
Blood, UA: NEGATIVE
Glucose, UA: NEGATIVE
Ketones, UA: NEGATIVE
Leukocytes, UA: NEGATIVE
Nitrite, UA: NEGATIVE
Protein, UA: NEGATIVE
Spec Grav, UA: >=1.03 — AB (ref 1.010–1.025)
Urobilinogen, UA: 0.2 E.U./dL
pH, UA: 5.5 (ref 5.0–8.0)

## 2020-10-17 NOTE — Assessment & Plan Note (Addendum)
Urinary urgency: Sherri Gill states that for the past week she has been experiencing increased urinary urgency and frequency.  She states that sometime last week she had chills and on checking of her temperature she was not febrile.  She denies dysuria or vaginal discharge.  She states that she thought she had a urinary tract infection as she reports that in 2019 she was diagnosed with a UTI and a yeast infection which improved with Diflucan.  On pelvic exam, patient does not have any evidence of vaginal discharge or cervical motion tenderness.  Dipstick urine test was unremarkable.  Assessment and plan: Urinary urgency without any clear etiology.  We will follow up on urinalysis and cervical ancillary test for vaginal candidiasis, trichomonas and bacterial vaginosis.  ADDENDUM: Urinalysis unremarkable, Gardnerella, Candida, trichomonas all unremarkable.  It appears that in 2015, she had CT angiography of the head which showed an empty sella.  TSH performed in 2015 was unremarkable, BMP has not shown any evidence of electrolyte abnormalities with her sodium or potassium.  Hypopituitarism very less likely with her complaint of chills.  If symptoms persist, can consider obtaining levels including FSH, LH, ACTH, TSH.

## 2020-10-17 NOTE — Progress Notes (Signed)
   CC: Urinary urgency  HPI:  [name] is a 69 y.o. with medical history listed below presenting with complaints of urinary urgency.  Please see problem based charting for further details.  Past Medical History:  Diagnosis Date  . Chronic anemia 08/29/2012   Need colonoscopy or report. Need iron panel.   . Dyslipidemia 06/30/2007  . Essential hypertension, benign 06/30/2007  . GERD 06/30/2007  . Hx of Herpes simplex meningitis 2015   Also noted to have primary empty sella on imaging at this admission  . Insomnia 06/11/2012  . Major depressive disorder, recurrent episode, moderate with anxious distress ([place]) 06/30/2007  . Obesity, BMI 35-40 06/11/2012  . Peripheral neuropathy 2/2 T2DM 12/28/2009  . Primary empty sella syndrome ([place]) 2015   Noted on imaging during hospitalization for herpes meningitis; no pituitary mass, no hormone w/u at that time, hormonally asymptomatic  . Type 2 diabetes mellitus with neurological complications ([place]) 48/54/6270   Review of Systems:  As per HPI  Physical Exam:  Vitals:   10/17/20 1520  BP: (!) 170/62  Pulse: 88  Temp: 98.6 F (37 C)  TempSrc: Oral  SpO2: 100%  Weight: 203 lb (92.1 kg)  Height: 5\' 3"  (1.6 m)   Physical Exam Vitals and nursing note reviewed. Exam conducted with a chaperone present.  Cardiovascular:     Rate and Rhythm: Normal rate.     Heart sounds: Normal heart sounds.  Pulmonary:     Breath sounds: Normal breath sounds.  Genitourinary:    General: Normal vulva.     Pubic Area: No rash or pubic lice.      Vagina: Normal.     Cervix: No cervical motion tenderness, discharge, friability, lesion, erythema, cervical bleeding or eversion.  Musculoskeletal:     Cervical back: Normal range of motion.     Assessment & Plan:   See Encounters Tab for problem based charting.  Patient discussed with Dr. Dareen Piano

## 2020-10-17 NOTE — Patient Instructions (Signed)
Ms. Urton,  Thanks for seeing me today.  I will call you with the results of the vaginal test and the urine test.  Take care! Dr. Eileen Stanford  Please call the internal medicine center clinic if you have any questions or concerns, we may be able to help and keep you from a long and expensive emergency room wait. Our clinic and after hours phone number is [phone], the best time to call is Monday through Friday 9 am to 4 pm but there is always someone available 24/7 if you have an emergency. If you need medication refills please notify your pharmacy one week in advance and they will send [place] a request.   If you have not gotten the COVID vaccine, I recommend doing so:  You may get it at your local [facility] or [facility] OR To schedule an appointment for a COVID vaccine or be added to the vaccine wait list: Go to [email or web address]   OR Go to [email or web address]                  OR Call [phone]                                     OR Call [phone] and select Option 2

## 2020-10-18 LAB — URINALYSIS, ROUTINE W REFLEX MICROSCOPIC
Bilirubin, UA: NEGATIVE
Glucose, UA: NEGATIVE
Ketones, UA: NEGATIVE
Leukocytes,UA: NEGATIVE
Nitrite, UA: NEGATIVE
Protein,UA: NEGATIVE
RBC, UA: NEGATIVE
Specific Gravity, UA: 1.02 (ref 1.005–1.030)
Urobilinogen, Ur: 0.2 mg/dL (ref 0.2–1.0)
pH, UA: 6 (ref 5.0–7.5)

## 2020-10-18 LAB — CERVICOVAGINAL ANCILLARY ONLY
Bacterial Vaginitis (gardnerella): NEGATIVE
Candida Glabrata: NEGATIVE
Candida Vaginitis: NEGATIVE
Comment: NEGATIVE
Comment: NEGATIVE
Comment: NEGATIVE
Comment: NEGATIVE
Trichomonas: NEGATIVE

## 2020-10-25 NOTE — Progress Notes (Signed)
Internal Medicine Clinic Attending  Case discussed with Dr. Agyei  At the time of the visit.  We reviewed the resident's history and exam and pertinent patient test results.  I agree with the assessment, diagnosis, and plan of care documented in the resident's note.  

## 2020-11-08 NOTE — Progress Notes (Signed)
Established Patient Office Visit  Subjective:  Patient ID: Sherri Gill, female    DOB: 07-03-1951  Age: 70 y.o. MRN: [id number]  CC:  Chief Complaint  Patient presents with  . Diabetes  . Arm Pain    HPI Sherri Gill presents for routine f/u of chronic conditions, and to update lab monitoring and health maintenance measures. Type 2 diabetes mellitus and blood pressure had been under good control at last visit.  She is due for a number of labs as well as some health maintenance interventions, and today mammogram order will be placed as well as referral to eye specialist. Labs to be completed include A1c, CMP, vit D, TSH, Hep C screening, lipids.  Needs Covid booster. Needs flu shot.     Since our last visit, she has been dealing with stress and grief having been our of town to care for her ill brother, who ultimately passed away due to complications from Covid.  Her son with mental illness continues to live with her, and this has actually been a positive development for each of them - his condition is better managed, and she no longer has the worry associated with trying to be his advocate from a distance.  She feels emotionally better than she did last summer, and is working at a part-time retail position. She self-discontinued Effexor, though needs RF for bupropion. Today she indicates that she doesn't feel a need for referral for counseling.    Her symptom of concern is of development over the past few months of foot cramps (tightening of muscles plantar surface with toe curling), painful and in her mind associated with stress, though she has not been drinking much fluid either.  SHe is able to stand and walk to immediately arrest the cramps. No toe numbness or pain.  SHe also reports L arm tingling and numbness, "like neuropathy", which is typically noticed at night and never during the day.     She  sleeps with neck laterally flexed "on a pillow mess".   She has been seen in the  clinic by my colleagues in clinic on 10/17/20 for dysuria and concern about UTI; UA and wet prep were unremarkable.  Symptoms resolved.  CCM encounters have noted discrepancy in Sherri Gill's amlodipine dose - while the dose was increased from 5 to 10 mg last summer, Sherri Gill  wasn't aware of this change.  She has been taking the 10 mg more  recently and we will check pressures today.    Patient Active Problem List   Diagnosis Date Noted  . Radicular pain in left arm 11/10/2020  . Elevated alkaline phosphatase level 03/17/2016  . Carpal tunnel syndrome 02/11/2014  . Iron deficiency anemia 08/29/2012  . Obesity, BMI 35-40 06/11/2012  . Dyslipidemia associated with type 2 diabetes mellitus (Sherri Gill) 06/30/2007  . Major depressive disorder with single episode, in remission (Sherri Gill) 06/30/2007  . Essential hypertension 06/30/2007  . GERD 06/30/2007  . Type 2 diabetes mellitus with neurological complications (Sherri Gill) 23/53/6144   Current Outpatient Medications on File Prior to Visit  Medication Sig Dispense Refill  . albuterol (PROVENTIL HFA;VENTOLIN HFA) 108 (90 Base) MCG/ACT inhaler Inhale 2 puffs every 6 (six) hours as needed into the lungs for wheezing or shortness of breath. 1 Inhaler 0  . amLODipine (NORVASC) 10 MG tablet Take 1 tablet (10 mg total) by mouth daily. 90 tablet 2  . atorvastatin (LIPITOR) 20 MG tablet Take 1 tablet (20 mg total) by mouth daily.  90 tablet 2  . Blood Glucose Monitoring Suppl (ONE TOUCH ULTRA MINI) w/Device KIT Please use as directed. 1 each 0  . Cholecalciferol 1000 units capsule Take 1 capsule (1,000 Units total) by mouth daily. 90 capsule 1  . ferrous gluconate (FERGON) 324 MG tablet Take 1 tablet (324 mg total) by mouth daily. 90 tablet 1  . ferrous sulfate 325 (65 FE) MG tablet Take by mouth.    . fluticasone (FLONASE) 50 MCG/ACT nasal spray Place 2 sprays into both nostrils daily. 1 g 2  . glucose blood (ONE TOUCH ULTRA TEST) test strip Use as instructed 100 each  1  . hydrochlorothiazide (HYDRODIURIL) 25 MG tablet TAKE 1 TABLET DAILY WITH LOSARTAN 90 tablet 1  . Insulin Pen Needle 32G X 4 MM MISC 1 each by Does not apply route daily. 100 each 3  . losartan (COZAAR) 100 MG tablet Take 1 tablet by mouth once daily 90 tablet 0  . metFORMIN (GLUCOPHAGE) 1000 MG tablet Take 1 tablet (1,000 mg total) by mouth 2 (two) times daily with a meal. 180 tablet 0  . ONETOUCH DELICA LANCETS 15X MISC Please use as directed. 100 each 0  . VICTOZA 18 MG/50ML SOPN PLEASE START TAKING VICTOZA 0.6MG DAILY WEEK 1, THEN TAKE 1.2MG DAILY WEEK 2, AND THEN TAKE 1.8MG DAILY THEREAFTER 9 mL 1   No current facility-administered medications on file prior to visit.   Family and Social History reviewed; made changes to update record.  Objective  144/68, BMI 35.43 Positive affect, interactive and engaging, mood stable, language normal and goal directed.  Targeted physical exam radial pulses 2+ bilateral, fingers warm, hands elevated overhead remain warm and pink with no elicitation of dysesthesia of the left upper extremity.  Spurling's test negative.  Neck range of motion normal but with some mild stiffness in left and right lateral flexion.  No lower extremity edema.  Feet in excellent repair, nails trimmed, DPs 2+.  Assessment and Plan: See problem based documentation.  F/u:  52M for DM, HTN recheck, as well as with iron studies and lipid panel which were omitted in error today.  Hopefully by then she will have undergone a diabetic eye exam and completed her mammogram.  She also reports that she will be seeking a COVID booster.

## 2020-11-09 ENCOUNTER — Encounter: Payer: Self-pay | Admitting: Internal Medicine

## 2020-11-09 ENCOUNTER — Ambulatory Visit (INDEPENDENT_AMBULATORY_CARE_PROVIDER_SITE_OTHER): Payer: Medicare HMO | Admitting: Internal Medicine

## 2020-11-09 VITALS — BP 144/68 | HR 83 | Temp 98.2°F | Ht 63.0 in | Wt 200.0 lb

## 2020-11-09 DIAGNOSIS — E669 Obesity, unspecified: Secondary | ICD-10-CM

## 2020-11-09 DIAGNOSIS — F325 Major depressive disorder, single episode, in full remission: Secondary | ICD-10-CM

## 2020-11-09 DIAGNOSIS — R69 Illness, unspecified: Secondary | ICD-10-CM | POA: Diagnosis not present

## 2020-11-09 DIAGNOSIS — E785 Hyperlipidemia, unspecified: Secondary | ICD-10-CM

## 2020-11-09 DIAGNOSIS — E1169 Type 2 diabetes mellitus with other specified complication: Secondary | ICD-10-CM

## 2020-11-09 DIAGNOSIS — D509 Iron deficiency anemia, unspecified: Secondary | ICD-10-CM

## 2020-11-09 DIAGNOSIS — Z23 Encounter for immunization: Secondary | ICD-10-CM

## 2020-11-09 DIAGNOSIS — E66812 Obesity, class 2: Secondary | ICD-10-CM

## 2020-11-09 DIAGNOSIS — R748 Abnormal levels of other serum enzymes: Secondary | ICD-10-CM | POA: Diagnosis not present

## 2020-11-09 DIAGNOSIS — I10 Essential (primary) hypertension: Secondary | ICD-10-CM | POA: Diagnosis not present

## 2020-11-09 DIAGNOSIS — M792 Neuralgia and neuritis, unspecified: Secondary | ICD-10-CM | POA: Diagnosis not present

## 2020-11-09 DIAGNOSIS — Z139 Encounter for screening, unspecified: Secondary | ICD-10-CM

## 2020-11-09 DIAGNOSIS — Z860100 Personal history of colon polyps, unspecified: Secondary | ICD-10-CM

## 2020-11-09 DIAGNOSIS — F331 Major depressive disorder, recurrent, moderate: Secondary | ICD-10-CM

## 2020-11-09 DIAGNOSIS — E1149 Type 2 diabetes mellitus with other diabetic neurological complication: Secondary | ICD-10-CM | POA: Diagnosis not present

## 2020-11-09 DIAGNOSIS — Z0000 Encounter for general adult medical examination without abnormal findings: Secondary | ICD-10-CM

## 2020-11-09 LAB — POCT GLYCOSYLATED HEMOGLOBIN (HGB A1C): Hemoglobin A1C: 8 % — AB (ref 4.0–5.6)

## 2020-11-09 LAB — GLUCOSE, CAPILLARY: Glucose-Capillary: 92 mg/dL (ref 70–99)

## 2020-11-09 MED ORDER — BUPROPION HCL ER (XL) 150 MG PO TB24: 150.0000 mg | ORAL_TABLET | Freq: Every day | ORAL | 0 refills | Status: DC

## 2020-11-09 NOTE — Patient Instructions (Signed)
Ms. Bal,  It was wonderful to see you today!  We discussed the symptoms you are experiencing in your left arm at night, and how this is likely due to a "kink" in your neck due to excessive elevation from your pillows.  You are going to work on this, and will also consider a hug pillow when you are lying on your side, in order to support your shoulders.  Your A1c is 8 today.  This is likely to improve over the next 3 months as long as you are able to take your medications as prescribed and watch her diet.  You were due for a number of blood test today.  I will contact you with the results.  We will also be setting up referrals for diabetic eye exam, and for colon cancer screening with a colonoscopy.  I hope to see you again in 3 months!  Please get your COVID booster in the meantime.  Take care and stay well,  Dr. [name]

## 2020-11-10 ENCOUNTER — Encounter: Payer: Self-pay | Admitting: Internal Medicine

## 2020-11-10 ENCOUNTER — Telehealth: Payer: Medicare HMO

## 2020-11-10 DIAGNOSIS — M792 Neuralgia and neuritis, unspecified: Secondary | ICD-10-CM | POA: Insufficient documentation

## 2020-11-10 DIAGNOSIS — Z0000 Encounter for general adult medical examination without abnormal findings: Secondary | ICD-10-CM | POA: Insufficient documentation

## 2020-11-10 DIAGNOSIS — Z860100 Personal history of colon polyps, unspecified: Secondary | ICD-10-CM | POA: Insufficient documentation

## 2020-11-10 LAB — CMP14 + ANION GAP
ALT: 12 IU/L (ref 0–32)
AST: 16 IU/L (ref 0–40)
Albumin/Globulin Ratio: 1.4 (ref 1.2–2.2)
Albumin: 4.4 g/dL (ref 3.8–4.8)
Alkaline Phosphatase: 171 IU/L — ABNORMAL HIGH (ref 44–121)
Anion Gap: 16 mmol/L (ref 10.0–18.0)
BUN/Creatinine Ratio: 18 (ref 12–28)
BUN: 15 mg/dL (ref 8–27)
Bilirubin Total: <0.2 mg/dL (ref 0.0–1.2)
CO2: 26 mmol/L (ref 20–29)
Calcium: 10.3 mg/dL (ref 8.7–10.3)
Chloride: 99 mmol/L (ref 96–106)
Creatinine, Ser: 0.82 mg/dL (ref 0.57–1.00)
GFR calc Af Amer: 86 mL/min/{1.73_m2} (ref >59)
GFR calc non Af Amer: 74 mL/min/{1.73_m2} (ref >59)
Globulin, Total: 3.2 g/dL (ref 1.5–4.5)
Glucose: 92 mg/dL (ref 65–99)
Potassium: 4.3 mmol/L (ref 3.5–5.2)
Sodium: 141 mmol/L (ref 134–144)
Total Protein: 7.6 g/dL (ref 6.0–8.5)

## 2020-11-10 LAB — TSH: TSH: 5.9 u[IU]/mL — ABNORMAL HIGH (ref 0.450–4.500)

## 2020-11-10 LAB — HEPATITIS C ANTIBODY: Hep C Virus Ab: <0.1 s/co ratio (ref 0.0–0.9)

## 2020-11-10 LAB — VITAMIN D 25 HYDROXY (VIT D DEFICIENCY, FRACTURES): Vit D, 25-Hydroxy: 30.6 ng/mL (ref 30.0–100.0)

## 2020-11-10 NOTE — Assessment & Plan Note (Signed)
Weight remains stable and an area of concern.  Over the past 6 months she has been dealing with severe depression and anxiety which are under much better control at this point, and she is recommitting to self-care including medicine and diet adherence.

## 2020-11-10 NOTE — Assessment & Plan Note (Addendum)
Considerable improvement compared to last summer, at which time she was experiencing intense anxiety bordering on panic associated with depressed mood, largely situational due to some social changes.  She feels she is in complete remission and sometime ago discontinued Effexor.  She continues on bupropion.[name]  PHQ-9 was not performed today.  Appetite and sleep are normal, no suicidal ideation, social contacts are intact, she feels purpose and direction in her life.

## 2020-11-10 NOTE — Assessment & Plan Note (Signed)
Intended to check CBC and iron studies today, omitted in error.  She continues to take OTC iron supplement.  Referral placed for previously referred colonoscopy, not completed due to pandemic.

## 2020-11-10 NOTE — Assessment & Plan Note (Signed)
On recheck today this remains elevated at around 170.  In setting of morbid obesity and diabetes, this most likely reflects metabolic associated fatty liver disease.  On liraglutide which may help with some weight loss, the diet and exercise will need to be a focus.  I will have a future conversation with her about this.

## 2020-11-10 NOTE — Assessment & Plan Note (Signed)
A1c 8 today, she acknowledges difficulty with diet and self-care over the past few months and has recommitted to medicine adherence and dietary discretion.  Recheck 3 months.

## 2020-11-10 NOTE — Assessment & Plan Note (Signed)
Was my intention to order lipid panel today, but was admitted in error.  I will update this at her 96-month follow-up.  She continues on statin therapy.

## 2020-11-10 NOTE — Assessment & Plan Note (Signed)
SBP uncontrolled today and at last visit in December 2021.  She endorses adherence.  Amlodipine 10, losartan 100, HCTZ 25 mg- I suspect poor control (previously at goal) as a result of a difficult last few months which is also affected her diabetes.  No changes until follow-up at 3 months.  Next option would be change from HCTZ to chlorthalidone for higher potency.

## 2020-11-14 ENCOUNTER — Ambulatory Visit: Payer: Medicare HMO | Admitting: *Deleted

## 2020-11-14 DIAGNOSIS — I10 Essential (primary) hypertension: Secondary | ICD-10-CM

## 2020-11-14 DIAGNOSIS — E1149 Type 2 diabetes mellitus with other diabetic neurological complication: Secondary | ICD-10-CM

## 2020-11-14 DIAGNOSIS — F331 Major depressive disorder, recurrent, moderate: Secondary | ICD-10-CM

## 2020-11-14 NOTE — Chronic Care Management (AMB) (Signed)
   11/14/2020  Sherri Gill [birth year] [id number]  Received voice mail from patient stating she thinks she is having adverse reaction to the flu shot that she received in the clinic on 11/09/20. She says she is having chills and stuffiness; did say her symptoms were better today. She is also inquiring about the results of the blood work and is requesting a call.  Plan:  Will message clinic's Red Team and request they call patient.    Kelli Churn RN, CCM, [facility] RN Care Manager [phone]

## 2020-11-16 ENCOUNTER — Ambulatory Visit: Payer: Medicare HMO | Admitting: *Deleted

## 2020-11-16 DIAGNOSIS — F331 Major depressive disorder, recurrent, moderate: Secondary | ICD-10-CM

## 2020-11-16 DIAGNOSIS — I10 Essential (primary) hypertension: Secondary | ICD-10-CM

## 2020-11-16 DIAGNOSIS — E1149 Type 2 diabetes mellitus with other diabetic neurological complication: Secondary | ICD-10-CM

## 2020-11-16 NOTE — Chronic Care Management (AMB) (Signed)
Chronic Care Management   CCM RN Visit Note  11/16/2020 Name: Sherri Gill MRN: [id number] DOB: 1951-01-23  Subjective: Sherri Gill is a 70 y.o. year old female who is a primary care patient of [name], MD. The care management team was consulted for assistance with disease management and care coordination needs.    Engaged with patient by telephone for follow up visit in response to provider referral for case management and/or care coordination services.   Consent to Services:  The patient was given information about Chronic Care Management services, agreed to services, and gave verbal consent prior to initiation of services.  Please see initial visit note for detailed documentation.   Patient agreed to services and verbal consent obtained.   Assessment: Review of patient past medical history, allergies, medications, health status, including review of consultants reports, laboratory and other test data, was performed as part of comprehensive evaluation and provision of chronic care management services.   SDOH (Social Determinants of Health) assessments and interventions performed:    CCM Care Plan  Allergies  Allergen Reactions  . Codeine Sulfate Nausea And Vomiting and Other (See Comments)    Dizziness  . Penicillins Nausea And Vomiting  . Percocet [Oxycodone-Acetaminophen] Nausea And Vomiting  . Codeine Diarrhea  . Lisinopril Cough    Outpatient Encounter Medications as of 11/16/2020  Medication Sig Note  . albuterol (PROVENTIL HFA;VENTOLIN HFA) 108 (90 Base) MCG/ACT inhaler Inhale 2 puffs every 6 (six) hours as needed into the lungs for wheezing or shortness of breath.   [name] amLODipine (NORVASC) 10 MG tablet Take 1 tablet (10 mg total) by mouth daily. 08/31/2020: Patient is taking 5 mg of amlodipine as she never took 10 mg but the most recent Rx was called in for 10 mg so she is cutting the pills in 1/2  . atorvastatin (LIPITOR) 20 MG tablet Take 1 tablet (20  mg total) by mouth daily.   . Blood Glucose Monitoring Suppl (ONE TOUCH ULTRA MINI) w/Device KIT Please use as directed.   [name] buPROPion (WELLBUTRIN XL) 150 MG 24 hr tablet Take 1 tablet (150 mg total) by mouth daily.   . Cholecalciferol 1000 units capsule Take 1 capsule (1,000 Units total) by mouth daily.   . ferrous gluconate (FERGON) 324 MG tablet Take 1 tablet (324 mg total) by mouth daily.   . ferrous sulfate 325 (65 FE) MG tablet Take by mouth.   . fluticasone (FLONASE) 50 MCG/ACT nasal spray Place 2 sprays into both nostrils daily.   [name] glucose blood (ONE TOUCH ULTRA TEST) test strip Use as instructed   . hydrochlorothiazide (HYDRODIURIL) 25 MG tablet TAKE 1 TABLET DAILY WITH LOSARTAN   . Insulin Pen Needle 32G X 4 MM MISC 1 each by Does not apply route daily.   [name] losartan (COZAAR) 100 MG tablet Take 1 tablet by mouth once daily   . metFORMIN (GLUCOPHAGE) 1000 MG tablet Take 1 tablet (1,000 mg total) by mouth 2 (two) times daily with a meal.   . ONETOUCH DELICA LANCETS 77L MISC Please use as directed.   [name] VICTOZA 18 MG/3ML SOPN PLEASE START TAKING VICTOZA 0.6MG DAILY WEEK 1, THEN TAKE 1.2MG DAILY WEEK 2, AND THEN TAKE 1.8MG DAILY THEREAFTER    No facility-administered encounter medications on file as of 11/16/2020.    Patient Active Problem List   Diagnosis Date Noted  . Radicular pain in left arm 11/10/2020  . Routine health maintenance 11/10/2020  . Personal history of colonic  polyps 11/10/2020  . Elevated alkaline phosphatase level 03/17/2016  . Carpal tunnel syndrome 02/11/2014  . Iron deficiency anemia 08/29/2012  . Obesity, BMI 35-40 06/11/2012  . Dyslipidemia associated with type 2 diabetes mellitus ([place]) 06/30/2007  . Major depressive disorder with single episode, in remission ([place]) 06/30/2007  . Essential hypertension 06/30/2007  . GERD 06/30/2007  . Type 2 diabetes mellitus with neurological complications ([place]) 50/27/7412    Conditions to be addressed/monitored: HTN,  GERD, NIDDM, HLD. MDD, Obesity  Patient Care Plan: CCM RN- Diabetes Type 2 (Adult), HTN, Depression/Anxiety    Problem Identified: Glycemic Management (Diabetes, Type 2), HTN, Depression/Anxiety   Priority: High    Goal: Glycemic, HTN, and anxiety/depression Management Optimized   Start Date: 04/19/2020  This Visit's Progress: On track  Priority: High  Note:   CARE PLAN ENTRY (see longitudinal plan of care for additional care plan information)  Objective:    Lab Results  Component Value Date   HGBA1C 8.0 (A) 11/09/2020   HGBA1C 7.1 (A) 03/28/2020   HGBA1C 7.4 (A) 09/28/2019   Lab Results  Component Value Date   MICROALBUR 1.4 08/03/2014   LDLCALC 42 08/07/2017   CREATININE 0.82 11/09/2020       Current Barriers:  [name] Knowledge Deficits related to self management of chronic disease states- successful outreach to patient, she says her stress level is very high because of her son's mental health issues and that because he lives with her, she says she may have to place him back in a group home because of the negative effects his issues are having on her health including  worsening glycemic and BP control, she is also asking for her lab results of 11/09/20  . Case Manager Clinical Goal(s):  Over the next 30-60 days, patient will demonstrate improved adherence to prescribed treatment plan for diabetes self care/management as evidenced by:  . daily monitoring and recording of CBG  . adherence to ADA/ carb modified diet . exercise at least 5 days/week . adherence to prescribed medication regimen  Interventions:  . Discussed plans with patient for ongoing care management follow up and provided patient with direct contact information for care management team . Assessed patient's self management skills in regards to HTN, NIDDM, HLD, and anxiety/depression . Evaluation of current treatment plan related to HTN, NIDDM, HLD, and depression/anxiety and patient's adherence to plan as  established by provider. . Reviewed medications with patient and assessed medication taking behavior;  if indicated, discussed importance of medication adherence  . Provided opportunity for patient to share the stress she is feeling related to her son's mental health issues . Reviewed results of labs drawn on 11/09/20 and sent a second request that patient wishes to discuss lab results with Dr [name], especially the TSH as she does not wish to start thyroid medicine unless absolutely necessary . Suggested patient seek counseling from the clinic's behavioral health counselor . With patient's permission, will ask for referral to behavioral health counselor  Patient Self Care Activities:  . UNABLE to independently get Hgb A1C under 7.0% . Unable to independently secure appointment with clinic behavioral health counselor  . - keep all provider appointments  . call to cancel if needed . - keep a calendar with appointment dates . - check blood sugar at prescribed times . - check blood sugar if I feel it is too high or too low . - take the blood sugar meter to all doctor visits . - check blood pressure weekly . -  check out counseling . - keep 90 percent of counseling appointments . - schedule counseling appointment      Plan:The care management team will reach out to the patient again over the next 30-60 days.   Kelli Churn RN, CCM, [facility] RN Care Manager [phone]

## 2020-11-16 NOTE — Patient Instructions (Signed)
Visit Information  It was nice speaking with you today.  Patient Care Plan: CCM RN- Diabetes Type 2 (Adult), HTN, Depression/Anxiety    Problem Identified: Glycemic Management (Diabetes, Type 2), HTN, Depression/Anxiety   Priority: High    Goal: Glycemic, HTN, and anxiety/depression Management Optimized   Start Date: 04/19/2020  This Visit's Progress: On track  Priority: High  Note:   CARE PLAN ENTRY (see longitudinal plan of care for additional care plan information)  Objective:    Lab Results  Component Value Date   HGBA1C 8.0 (A) 11/09/2020   HGBA1C 7.1 (A) 03/28/2020   HGBA1C 7.4 (A) 09/28/2019   Lab Results  Component Value Date   MICROALBUR 1.4 08/03/2014   LDLCALC 42 08/07/2017   CREATININE 0.82 11/09/2020       Current Barriers:  [name] Knowledge Deficits related to self management of chronic disease states- successful outreach to patient, she says her stress level is very high because of her son's mental health issues and that because he lives with her, she says she may have to place him back in a group home because of the negative effects his issues are having on her health including  worsening glycemic and BP control, she is also asking for her lab results of 11/09/20  . Case Manager Clinical Goal(s):  Over the next 30-60 days, patient will demonstrate improved adherence to prescribed treatment plan for diabetes self care/management as evidenced by:  . daily monitoring and recording of CBG  . adherence to ADA/ carb modified diet . exercise at least 5 days/week . adherence to prescribed medication regimen  Interventions:  . Discussed plans with patient for ongoing care management follow up and provided patient with direct contact information for care management team . Assessed patient's self management skills in regards to HTN, NIDDM, HLD, and anxiety/depression . Evaluation of current treatment plan related to HTN, NIDDM, HLD, and depression/anxiety and patient's  adherence to plan as established by provider. . Reviewed medications with patient and assessed medication taking behavior;  if indicated, discussed importance of medication adherence  . Provided opportunity for patient to share the stress she is feeling related to her son's mental health issues . Reviewed results of labs drawn on 11/09/20 and sent a second request that patient wishes to discuss lab results with Dr [name], especially the TSH as she does not wish to start thyroid medication unless absolutely necessary . Suggested patient seek counseling from the clinic's behavioral health counselor . With patient's permission, will ask for referral to behavioral health counselor  Patient Self Care Activities:  . UNABLE to independently get Hgb A1C under 7.0% . Unable to independently secure appointment with clinic behavioral health counselor  . - keep all provider appointments  . call to cancel if needed . - keep a calendar with appointment dates . - check blood sugar at prescribed times . - check blood sugar if I feel it is too high or too low . - take the blood sugar meter to all doctor visits . - check blood pressure weekly . - check out counseling . - keep 90 percent of counseling appointments . - schedule counseling appointment      The patient verbalized understanding of instructions, educational materials, and care plan provided today and declined offer to receive copy of patient instructions, educational materials, and care plan.   Kelli Churn RN, CCM, [facility] RN Care Manager [phone]

## 2020-11-17 ENCOUNTER — Telehealth: Payer: Medicare HMO

## 2020-11-17 NOTE — Addendum Note (Signed)
Addended by: Asencion Noble on: 11/17/2020 11:26 AM   Modules accepted: Orders

## 2020-11-17 NOTE — Progress Notes (Signed)
Internal Medicine Clinic Resident  I have personally reviewed this encounter including the documentation in this note and/or discussed this patient with the care management provider. I will address any urgent items identified by the care management provider and will communicate my actions to the patient's PCP. Patient has history of depression and requesting referral to counseling. Will place referral to integrative behavioral health. I have reviewed the patient's CCM visit with my supervising attending, Dr Dareen Piano.  Sherri Noble, MD 11/17/2020

## 2020-11-21 NOTE — Progress Notes (Signed)
Internal Medicine Clinic Attending  CCM services provided by the care management provider and their documentation were discussed with Dr. Krienke. We reviewed the pertinent findings, urgent action items addressed by the resident and non-urgent items to be addressed by the PCP.  I agree with the assessment, diagnosis, and plan of care documented in the CCM and resident's note.  Danikah Budzik, MD 11/21/2020  

## 2020-11-22 ENCOUNTER — Encounter: Payer: Self-pay | Admitting: Internal Medicine

## 2020-11-22 ENCOUNTER — Telehealth: Payer: Self-pay

## 2020-11-22 NOTE — Telephone Encounter (Signed)
Received TC from patient who is upset that she had labwork done on 11/09/20 and hasn't heard back from MD.  She states she did speak to "a nurse" and was told her thyroid lab was high.  She states she likes Dr. [name] a lot, but wants to know why she hasn't called back. RN apologized that no one called her with lab results and perhaps this was an oversight, patient still upset and started crying. RN informed patient that Dr. [name] is scheduled to be attending this afternoon and RN will forward message to her and ask she call her. SChaplin, RN,BSN

## 2020-11-23 ENCOUNTER — Telehealth: Payer: Self-pay | Admitting: Internal Medicine

## 2020-11-23 ENCOUNTER — Other Ambulatory Visit: Payer: Self-pay | Admitting: Internal Medicine

## 2020-11-23 NOTE — Telephone Encounter (Signed)
Returned call to Sherri Gill who had called to inquire about her lab results, and was particularly concerned about mildly elevated TSH.  I reassured her that this mild elevation did not require treatment, and that we would continue to periodically monitor.  She appreciated the update.

## 2020-11-23 NOTE — Telephone Encounter (Signed)
Called and discussed results and had a good conversation.

## 2020-11-24 NOTE — Addendum Note (Signed)
Addended by: Dorian Pod A on: 11/24/2020 10:43 AM   Modules accepted: Orders

## 2020-11-24 NOTE — Progress Notes (Signed)
Phone call placed to Ms. Hilmer to discuss her lab results, reassurance given.  Order will be placed for counseling.

## 2020-11-27 ENCOUNTER — Other Ambulatory Visit: Payer: Self-pay

## 2020-11-27 ENCOUNTER — Other Ambulatory Visit: Payer: Self-pay | Admitting: Internal Medicine

## 2020-11-27 DIAGNOSIS — E1140 Type 2 diabetes mellitus with diabetic neuropathy, unspecified: Secondary | ICD-10-CM

## 2020-11-27 DIAGNOSIS — F331 Major depressive disorder, recurrent, moderate: Secondary | ICD-10-CM

## 2020-11-27 DIAGNOSIS — I10 Essential (primary) hypertension: Secondary | ICD-10-CM

## 2020-11-27 DIAGNOSIS — IMO0002 Reserved for concepts with insufficient information to code with codable children: Secondary | ICD-10-CM

## 2020-11-27 MED ORDER — LOSARTAN POTASSIUM 100 MG PO TABS
100.0000 mg | ORAL_TABLET | Freq: Every day | ORAL | 3 refills | Status: DC
Start: 2020-11-27 — End: 2021-05-31

## 2020-11-27 MED ORDER — BUPROPION HCL ER (XL) 150 MG PO TB24: 150.0000 mg | ORAL_TABLET | Freq: Every day | ORAL | 3 refills | Status: DC

## 2020-11-27 MED ORDER — METFORMIN HCL 1000 MG PO TABS: 1000.0000 mg | ORAL_TABLET | Freq: Two times a day (BID) | ORAL | 3 refills | Status: DC

## 2020-11-27 MED ORDER — HYDROCHLOROTHIAZIDE 25 MG PO TABS: ORAL_TABLET | ORAL | 3 refills | Status: DC

## 2020-11-27 NOTE — Telephone Encounter (Signed)
Pls need a refill on losartan (COZAAR) 100 MG tablet   [place], [place] [address]  Pls stated she here on this month, all medicine was suppose to be refill, pt needs blood pressure

## 2020-11-28 ENCOUNTER — Encounter: Payer: Self-pay | Admitting: *Deleted

## 2020-12-01 NOTE — Addendum Note (Signed)
Addended by: Dorian Pod A on: 12/01/2020 09:33 AM   Modules accepted: Orders

## 2020-12-04 ENCOUNTER — Encounter: Payer: Self-pay | Admitting: Dietician

## 2020-12-04 ENCOUNTER — Telehealth: Payer: Self-pay | Admitting: Dietician

## 2020-12-04 NOTE — Telephone Encounter (Signed)
Sherri Gill has not heard from Dr. Zenia Resides office about her referral for her annual diabetes eye exam. She asked for help in scheduling the appointment.appointment scheduled for Thursday, 01/04/21 at 3 pm. Sherri Gill was notified of the appointment day and time.

## 2020-12-05 ENCOUNTER — Other Ambulatory Visit: Payer: Self-pay

## 2020-12-05 ENCOUNTER — Ambulatory Visit: Payer: Medicare HMO | Admitting: Behavioral Health

## 2020-12-05 DIAGNOSIS — F4323 Adjustment disorder with mixed anxiety and depressed mood: Secondary | ICD-10-CM

## 2020-12-05 NOTE — BH Specialist Note (Signed)
Integrated Behavioral Health via Telemedicine Visit  12/05/2020 Sherri Gill [id number]  Number of [place] visits: 1/6 Session Start time: 11:00am  Session End time: 11:50am Total time: 50   Referring Provider: Dr. Dorian Pod, MD Patient/Family location: Pt at home in private (off work today); normally after 3:30pm is better [facility] Provider location: [facility] Office All persons participating in visit: Pt & Clinician Types of Service: Individual psychotherapy  I connected with Sherri Gill and/or Sherri Gill self by Telephone  (Video is Caregility application) and verified that I am speaking with the correct person using two identifiers.Discussed confidentiality: Yes   I discussed the limitations of telemedicine and the availability of in person appointments.  Discussed there is a possibility of technology failure and discussed alternative modes of communication if that failure occurs.  I discussed that engaging in this telemedicine visit, they consent to the provision of behavioral healthcare and the services will be billed under their insurance.  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 Pt needing support. Duration of problem: Since July 2021; Severity of problem: moderate  Patient and/or Family's Strengths/Protective Factors: Social connections, Social and Emotional competence, Concrete supports in place (healthy food, safe environments, etc.), Sense of purpose and a new job to assist Pt in caring for self.  Goals Addressed: Patient will: 1.  Reduce symptoms of: anxiety, depression and stress  2.  Increase knowledge and/or ability  of: self-management skills, stress reduction and self-care practices that inc tolerance for frustration, addt'l psychoedu re: Schizophrenia across the lifespan of indiv & family devlpmnt  3.  Demonstrate ability to: Increase healthy adjustment to current life circumstances, Increase adequate support systems for patient/family and Begin healthy grieving over loss  Progress towards Goals: Estb'd today; Pt will consult w/Clinician weekly to reduce anxiety & inc coping  Interventions: Interventions utilized:  Intake/Assessment process Standardized Assessments completed: Not Needed  Patient and/or Family Response: Pt very receptive to call from Clinician this day. She also admitted needing a consult for her mental health needs last July 2021. Pt enthusiastic about need for weekly calls .  Assessment: Patient currently experiencing high levels of stress, although more managable now than last Summer. Pt recently moved her Son out of his [place] due to ineffective & irresponsible practices of care that were not helping him to thrive any longer. Pt naive to the adjustment process of helping a grown adult child w/a spmi in her home FT. Pt describes the common/typical description of this adjustment in the home. She is frustrated, often impatient, & does not have a full understanding to the impact of this Dx on her Son & the family at large.  Discussed resources for Pt to access & plan of care to temporarily meet weekly for Pt adjustment process.   Patient may benefit from several resources; nami.org for online Family Support Grp assistance. She can access this from her iphone. Also, calm.com-Pt has used this in the past & can now access again. Pt has close friend who is good support-cont to reach out.   Sent LOS to Pt & included list of Tips: 1-Give your Son & yourself grace & forgiveness. 2-Find the humor in the smallest things & laugh! 3-Remember, we  cannot solve every problem. 4-Find strength in  sharing your exp w/other Parents. 5-Try to improve your coping skills daily. 6-Remember, mental health issues are no one's fault & can be traumatic. 7-Take care of yourself first; your Son will benefit. 8-Use 'Review of the Day' at bedtime to identify the successes that have happened. 9-Praise your Son often! Try not to be punitive as he may not fully understand this.  Cont'd psychoedu about Schizophrenia Cont to utilized [employer] for Son to have adequate socialization w/others  Plan: 1. Follow up with behavioral health clinician on : 12/14/20 @ 3:00pm 2. Behavioral recommendations: Keep a Journal of your thoughts & feelings as you are able & we can discuss in session. Take things one step & one day at a time. Smaller chunks are less overwhelming. 3. Referral(s): nami.org Family Support Grps online, calm.com  I discussed the assessment and treatment plan with the patient and/or parent/guardian. They were provided an opportunity to ask questions and all were answered. They agreed with the plan and demonstrated an understanding of the instructions.   They were advised to call back or seek an in-person evaluation if the symptoms worsen or if the condition fails to improve as anticipated.  [name], LMFT

## 2020-12-14 ENCOUNTER — Ambulatory Visit: Payer: Medicare HMO | Admitting: Behavioral Health

## 2020-12-14 DIAGNOSIS — F4323 Adjustment disorder with mixed anxiety and depressed mood: Secondary | ICD-10-CM

## 2020-12-14 NOTE — BH Specialist Note (Signed)
Integrated Behavioral Health via Telemedicine Visit  12/14/2020 Sherri Gill [id number]  Number of [place] visits: 2/6 Session Start time: 3:00pm  Session End time: 3:30pm Total time: 30  Referring Provider: Dr. Dorian Pod, MD Patient/Family location: Pt at home in private [facility] Provider location: [facility] All persons participating in visit: Pt & Clinician Types of Service: Individual psychotherapy  I connected with Sherri Gill and/or Sherri Gill self by Telephone  (Video is Caregility application) and verified that I am speaking with the correct person using two identifiers.Discussed confidentiality: Yes   I discussed the limitations of telemedicine and the availability of in person appointments.  Discussed there is a possibility of technology failure and discussed alternative modes of communication if that failure occurs.  I discussed that engaging in this telemedicine visit, they consent to the provision of behavioral healthcare and the services will be billed under their insurance.  Patient and/or legal guardian expressed understanding and consented to Telemedicine visit: Yes   Presenting Concerns: Patient and/or family reports the following symptoms/concerns: elevated anxiety & home stressors Duration of problem: years; Severity of problem: mild  Patient and/or Family's Strengths/Protective Factors: Social connections, Social and Emotional competence and Concrete supports in place (healthy food, safe environments, etc.)  Goals Addressed: Patient will: 1.  Reduce symptoms of: anxiety, depression and stress  2.  Increase knowledge and/or ability of: coping skills and stress reduction  3.  Demonstrate ability to: Increase healthy adjustment to current life circumstances  Progress towards Goals: Ongoing  Interventions: Interventions utilized:  Solution-Focused Strategies and Supportive Counseling Standardized Assessments completed:  Not Needed  Patient and/or Family Response: Pt receptive to call from Clinician today  Assessment: Patient currently experiencing reduced sense of stress. Pt has found some keys to having a better day, reducing her stressors by taking small steps to create a positive exp, esp'ly for work.  Patient may benefit from cont'd supportive cslg. Addt'l coping skills & tools for relaxation.  Plan: 1. Follow up with behavioral health clinician on : 3 wks on telehealth for one hour 2. Behavioral recommendations: Cont the use of suggestions that are helping 3. Referral(s): [place] (In Clinic)  I discussed the assessment and treatment plan with the patient and/or parent/guardian. They were provided an opportunity to ask questions and all were answered. They agreed with the plan and demonstrated an understanding of the instructions.   They were advised to call back or seek an in-person evaluation if the symptoms worsen or if the condition fails to improve as anticipated.  Sherri Hutching, LMFT

## 2020-12-18 ENCOUNTER — Telehealth: Payer: Medicare HMO

## 2020-12-19 ENCOUNTER — Telehealth: Payer: Medicare HMO

## 2021-01-01 ENCOUNTER — Telehealth: Payer: Self-pay | Admitting: *Deleted

## 2021-01-01 ENCOUNTER — Telehealth: Payer: Medicare HMO

## 2021-01-01 NOTE — Telephone Encounter (Signed)
  Chronic Care Management   Outreach Note  01/01/2021 Name: Sherri Gill MRN: [id number] DOB: 02-26-1951  Referred by: [name], MD Reason for referral : Chronic Care Management (HTN, GERD, NIDDM, HLD. MDD, Obesity)   An unsuccessful telephone outreach was attempted today. Unable to leave message as recording states voice mailbox has not been set up. The patient was referred to the case management team for assistance with care management and care coordination.   Follow Up Plan: The care management team will reach out to the patient again over the next 7-14 days.   Kelli Churn RN, CCM, [facility] RN Care Manager [phone]

## 2021-01-03 ENCOUNTER — Other Ambulatory Visit: Payer: Self-pay

## 2021-01-03 ENCOUNTER — Ambulatory Visit: Payer: Medicare HMO | Admitting: Behavioral Health

## 2021-01-03 DIAGNOSIS — F4323 Adjustment disorder with mixed anxiety and depressed mood: Secondary | ICD-10-CM

## 2021-01-03 NOTE — BH Specialist Note (Signed)
Integrated Behavioral Health via Telemedicine Visit  01/03/2021 Sherri Gill [id number]  Number of [place] visits: 3/6 Session Start time: 2:20pm  Session End time: 2:45pm Total time: 30  Referring Provider: Dr. Dorian Pod, MD Patient/Family location: Pt is headed toward car from work [facility] Provider location: [facility] Office All persons participating in visit: Pt & Clinician Types of Service: Individual psychotherapy  I connected with Sherri Gill and/or Sherri Gill self via  Telephone or Video Enabled Telemedicine Application  (Video is Caregility application) and verified that I am speaking with the correct person using two identifiers. Discussed confidentiality: Yes   I discussed the limitations of telemedicine and the availability of in person appointments.  Discussed there is a possibility of technology failure and discussed alternative modes of communication if that failure occurs.  I discussed that engaging in this telemedicine visit, they consent to the provision of behavioral healthcare and the services will be billed under their insurance.  Patient and/or legal guardian expressed understanding and consented to Telemedicine visit: Yes   Presenting Concerns: Patient and/or family reports the following symptoms/concerns: elevated anxiety due to Son's health status changes & mental health Dx Duration of problem: wks; Severity of problem: moderate  Patient and/or Family's Strengths/Protective Factors: Social connections, Concrete supports in place (healthy food, safe environments, etc.) and Sense of purpose  Goals Addressed: Patient will: 1.  Reduce symptoms of: anxiety and stress  2.  Increase knowledge and/or ability of: coping skills and stress reduction  3.  Demonstrate ability to: Increase healthy adjustment to current life circumstances  Progress towards Goals: Ongoing  Interventions: Interventions utilized:  Solution-Focused  Strategies and Supportive Counseling Standardized Assessments completed: Not Needed  Patient and/or Family Response: Pt receptive to call today & wants future sessions  Assessment: Patient currently experiencing heightened anxiety & frustration due to Son's beh choices.   Patient may benefit from Cont'd support for Caregiving role.  Plan: 1. Follow up with behavioral health clinician on : 3 wks out for 30 min check-in on telehealth 2. Behavioral recommendations: Cont to monitor stress levels & keep perspective on Son's care 3. Referral(s): [place] (In Clinic)  I discussed the assessment and treatment plan with the patient and/or parent/guardian. They were provided an opportunity to ask questions and all were answered. They agreed with the plan and demonstrated an understanding of the instructions.   They were advised to call back or seek an in-person evaluation if the symptoms worsen or if the condition fails to improve as anticipated.  [name], LMFT

## 2021-01-04 DIAGNOSIS — H40023 Open angle with borderline findings, high risk, bilateral: Secondary | ICD-10-CM | POA: Diagnosis not present

## 2021-01-04 DIAGNOSIS — H40033 Anatomical narrow angle, bilateral: Secondary | ICD-10-CM | POA: Diagnosis not present

## 2021-01-04 DIAGNOSIS — E119 Type 2 diabetes mellitus without complications: Secondary | ICD-10-CM | POA: Diagnosis not present

## 2021-01-04 DIAGNOSIS — H25813 Combined forms of age-related cataract, bilateral: Secondary | ICD-10-CM | POA: Diagnosis not present

## 2021-01-04 DIAGNOSIS — H5212 Myopia, left eye: Secondary | ICD-10-CM | POA: Diagnosis not present

## 2021-01-04 DIAGNOSIS — H52223 Regular astigmatism, bilateral: Secondary | ICD-10-CM | POA: Diagnosis not present

## 2021-01-04 DIAGNOSIS — H40053 Ocular hypertension, bilateral: Secondary | ICD-10-CM | POA: Diagnosis not present

## 2021-01-04 DIAGNOSIS — H524 Presbyopia: Secondary | ICD-10-CM | POA: Diagnosis not present

## 2021-01-04 LAB — HM DIABETES EYE EXAM

## 2021-01-05 ENCOUNTER — Encounter: Payer: Self-pay | Admitting: *Deleted

## 2021-01-08 ENCOUNTER — Other Ambulatory Visit: Payer: Self-pay | Admitting: *Deleted

## 2021-01-08 MED ORDER — ATORVASTATIN CALCIUM 20 MG PO TABS: 20.0000 mg | ORAL_TABLET | Freq: Every day | ORAL | 3 refills | Status: DC

## 2021-01-16 ENCOUNTER — Telehealth: Payer: Self-pay | Admitting: *Deleted

## 2021-01-16 ENCOUNTER — Telehealth: Payer: Medicare HMO

## 2021-01-16 NOTE — Telephone Encounter (Signed)
  Chronic Care Management   Outreach Note  01/16/2021 Name: Sherri Gill MRN: [id number] DOB: 11-03-1950  Referred by: [name], MD Reason for referral : Chronic Care Management (HTN, GERD, NIDDM, HLD. MDD, Obesity)   A second unsuccessful telephone outreach was attempted today. Unable to leave message as recording received on patient's contact number states voice mailbox has not been set up yet. The patient was referred to the case management team for assistance with care management and care coordination.   Follow Up Plan: The care management team will reach out to the patient again over the next 7-14 days.   Kelli Churn RN, CCM, [facility] RN Care Manager [phone]

## 2021-01-23 ENCOUNTER — Ambulatory Visit: Payer: Medicare HMO | Admitting: *Deleted

## 2021-01-23 DIAGNOSIS — E1149 Type 2 diabetes mellitus with other diabetic neurological complication: Secondary | ICD-10-CM

## 2021-01-23 DIAGNOSIS — I10 Essential (primary) hypertension: Secondary | ICD-10-CM

## 2021-01-23 DIAGNOSIS — F4323 Adjustment disorder with mixed anxiety and depressed mood: Secondary | ICD-10-CM

## 2021-01-23 DIAGNOSIS — F331 Major depressive disorder, recurrent, moderate: Secondary | ICD-10-CM

## 2021-01-23 NOTE — Chronic Care Management (AMB) (Signed)
   01/23/2021  KEARI MIU 01-02-1951 [id number]  Returned call to patient after she left voice mail earlier today. She says she is very stressed,not sleeping and needs to talk with someone ,. She is concerned because she had an appointment with her eye doctor on 3/7 and was told her eye pressures were very high. She also says her son , who is schizophrenic, left his new prescription eye glasses in the a public restroom and when he went back someone had taken them. She says she has a telehealth appointment with the clinic counselor on 01/25/21. She thinks the Bupropion is also interfering with her sleep and she says she talked to her pharmacist and he told her to ask her primary care provider to change her antidepressant.   Plan: Advised patient to ask Dr Theodis Shove about treatment for insomnia during her telehealth visit on 01/25/21. Scheduled follow up with this CCM RN on 01/29/21 at 3:00 pm.  Kelli Churn RN, CCM, [facility] RN Care Manager [phone]

## 2021-01-25 ENCOUNTER — Telehealth: Payer: Self-pay

## 2021-01-25 ENCOUNTER — Ambulatory Visit: Payer: Medicare HMO | Admitting: Behavioral Health

## 2021-01-25 DIAGNOSIS — H40023 Open angle with borderline findings, high risk, bilateral: Secondary | ICD-10-CM | POA: Diagnosis not present

## 2021-01-25 DIAGNOSIS — H25813 Combined forms of age-related cataract, bilateral: Secondary | ICD-10-CM | POA: Diagnosis not present

## 2021-01-25 DIAGNOSIS — H40033 Anatomical narrow angle, bilateral: Secondary | ICD-10-CM | POA: Diagnosis not present

## 2021-01-25 DIAGNOSIS — H40053 Ocular hypertension, bilateral: Secondary | ICD-10-CM | POA: Diagnosis not present

## 2021-01-25 DIAGNOSIS — E119 Type 2 diabetes mellitus without complications: Secondary | ICD-10-CM | POA: Diagnosis not present

## 2021-01-25 NOTE — Telephone Encounter (Signed)
Stated that she is in need of a closer appt to see you ... she stated that she has crisis going on at the moment and she was ready for her appt today  . But was told she had to reschedule due to you being out of the office today

## 2021-01-29 ENCOUNTER — Telehealth: Payer: Self-pay | Admitting: *Deleted

## 2021-01-29 ENCOUNTER — Telehealth: Payer: Medicare HMO

## 2021-01-29 NOTE — Telephone Encounter (Signed)
  Care Management   Outreach Note  01/29/2021 Name: Sherri Gill MRN: [id number] DOB: 1951-09-23  Referred by: [name], MD Reason for referral : Care Coordination (HTN, GERD, NIDDM, HLD. MDD, Obesity)   An unsuccessful telephone outreach was attempted today to complete scheduled (3:00 pm)  follow up CCM  telephone assessment. Unable to leave message as recording received on contact number states voice mailbox has not been set up yet.  The patient was referred to the case management team for assistance with care management and care coordination.   Follow Up Plan: The care management team will reach out to the patient again over the next 7-14 days.   Kelli Churn RN, CCM, [facility] RN Care Manager [phone]

## 2021-01-30 ENCOUNTER — Encounter: Payer: Self-pay | Admitting: *Deleted

## 2021-01-30 ENCOUNTER — Telehealth: Payer: Self-pay | Admitting: Behavioral Health

## 2021-01-30 NOTE — Progress Notes (Signed)

## 2021-01-30 NOTE — Telephone Encounter (Signed)
Contacted Pt twice in the morning today to address urgency of situation. 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  Pt contact number not set-up for messages & could not leave a VM.  Dr. Theodis Shove

## 2021-01-31 ENCOUNTER — Telehealth: Payer: Self-pay | Admitting: Behavioral Health

## 2021-01-31 NOTE — Telephone Encounter (Signed)
Contacted Pt to f/u on call from last week. Pt stated she was in a crisis. Pt @ work this morning & could not speak when Clinician called. Pt will return call on her work break in about 15 min.  Dr. Theodis Shove

## 2021-02-02 ENCOUNTER — Telehealth: Payer: Self-pay | Admitting: *Deleted

## 2021-02-02 NOTE — Chronic Care Management (AMB) (Signed)
  Care Management   Note  02/02/2021 Name: Sherri Gill MRN: [id number] DOB: [birth year]  Sherri Gill is a 70 y.o. year old female who is a primary care patient of [name], MD and is actively engaged with the care management team. I reached out to [name] by phone today to assist with re-scheduling a follow up visit with the RN Case Manager  Follow up plan: A telephone outreach attempt made. The care management team will reach out to the patient again over the next 7-14 days. If patient returns call to provider office, please advise to call [place] at [phone].  [place] Management

## 2021-02-05 NOTE — Addendum Note (Signed)
Addended by: Hulan Fray on: 02/05/2021 05:19 PM   Modules accepted: Orders

## 2021-02-13 NOTE — Telephone Encounter (Signed)
Rescheduled

## 2021-02-13 NOTE — Chronic Care Management (AMB) (Signed)
  Care Management   Note  02/13/2021 Name: Sherri Gill MRN: [id number] DOB: [birth year]  Sherri Gill is a 70 y.o. year old female who is a primary care patient of [name], MD and is actively engaged with the care management team. I reached out to [name] by phone today to assist with re-scheduling a follow up visit with the RN Case Manager  Follow up plan: Telephone appointment with care management team member scheduled for:03/02/2021  [place] Management

## 2021-02-13 NOTE — Chronic Care Management (AMB) (Signed)
  Care Management   Note  02/13/2021 Name: DOLOREZ JEFFREY MRN: [id number] DOB: [birth year]  Sherri Gill is a 70 y.o. year old female who is a primary care patient of [name], MD and is actively engaged with the care management team. I reached out to [name] by phone today to assist with re-scheduling a follow up visit with the RN Case Manager  Follow up plan: A telephone outreach attempt made spoke to patient she is not available to take gave call back number.The care management team will reach out to the patient again over the next 7 days. If patient returns call to provider office, please advise to call [place] at [phone].  [place] Management

## 2021-02-15 ENCOUNTER — Other Ambulatory Visit: Payer: Self-pay

## 2021-02-15 ENCOUNTER — Telehealth: Payer: Self-pay | Admitting: Behavioral Health

## 2021-02-15 ENCOUNTER — Ambulatory Visit: Payer: Medicare HMO | Admitting: Behavioral Health

## 2021-02-15 NOTE — Telephone Encounter (Signed)
Contacted Pt three times today until 1:20pm re: 1:00pm telehealth session. Lft msg w/Pt to r/s at her convenience.  Dr. Theodis Shove

## 2021-02-16 ENCOUNTER — Telehealth: Payer: Self-pay | Admitting: Behavioral Health

## 2021-02-16 ENCOUNTER — Telehealth: Payer: Self-pay

## 2021-02-16 NOTE — Telephone Encounter (Signed)
RTC, patient states she doesn't need to talk with the nurse and she doesn't know why I'm calling her.  RN informed patient that RN received a message to call patient back.  Pt expresses frustration. RN informed patient that RN will send message to Dr. Theodis Shove and have her reach out to her about appt. SChaplin, RN,BSN

## 2021-02-16 NOTE — Progress Notes (Signed)
Things That May Be Affecting Your Health:  Alcohol  Hearing loss  Pain   x Depression  Home Safety  Sexual Health  x Diabetes  Lack of physical activity xx Stress   Difficulty with daily activities  Loneliness  Tiredness   Drug use  Medicines  Tobacco use   Falls  Motor Vehicle Safety  Weight   Food choices  Oral Health  Other    YOUR PERSONALIZED HEALTH PLAN : 1. Schedule your next subsequent Medicare Wellness visit in one year 2. Attend all of your regular appointments to address your medical issues 3. Complete the preventative screenings and services   Annual Wellness Visit   Medicare Covered Preventative Screenings and [place] Men and Women Who How Often Need? Date of Last Service Action  Abdominal Aortic Aneurysm Adults with AAA risk factors Once -     Alcohol Misuse and Counseling All Adults Screening once a year if no alcohol misuse. Counseling up to 4 face to face sessions. -    Bone Density Measurement  Adults at risk for osteoporosis Once every 2 yrs      Lipid Panel Z13.6 All adults without CV disease Once every 5 yrs -  2018    Colorectal Cancer   Stool sample or  Colonoscopy All adults 83 and older   Once every year  Every 10 years ?       Depression All Adults Once a year Known depression Today   Diabetes Screening Blood glucose, post glucose load, or GTT Z13.1  All adults at risk  Pre-diabetics  Once per year  Twice per year      Diabetes  Self-Management Training All adults Diabetics 10 hrs first year; 2 hours subsequent years. Requires Copay     Glaucoma  Diabetics  Family history of glaucoma  African Americans 70 yrs +  Hispanic Americans 49 yrs + Annually - requires coppay Referred to eye doctor ? f/u     Hepatitis C Z72.89 or F19.20  High Risk for HCV  Born between 1945 and 1965  Annually  Once done  10/3020   HIV Z11.4 All adults based on risk  Annually btw ages 23 & 22 regardless of risk  Annually  > 65 yrs if at increased risk Yes if she agrees     Lung Cancer Screening Asymptomatic adults aged 60-77 with 2 pack yr history and current smoker OR quit within the last 15 yrs Annually Must have counseling and shared decision making documentation before first screen -     Medical Nutrition Therapy Adults with   Diabetes  Renal disease  Kidney transplant within past 3 yrs 3 hours first year; 2 hours subsequent years     Obesity and Counseling All adults Screening once a year Counseling if BMI 30 or higher  Today   Tobacco Use Counseling Adults who use tobacco  Up to 8 visits in one year -    Vaccines Z23  Hepatitis B  Influenza   Pneumonia  Adults   Once  Once every flu season  Two different vaccines separated by one year Needs some updating    Next Annual Wellness Visit People with Medicare Every year  Today     Services & Screenings Women Who How Often Need  Date of Last Service Action  Mammogram  Z12.31 Women over 3 One baseline ages 55-39. Annually ager 67 yrs+ Verlon Au been ordered     Pap tests All women Annually if high  risk. Every 2 yrs for normal risk women ?     Screening for cervical cancer with   Pap (Z01.419 nl or Z01.411abnl) &  HPV Z11.51 Women aged 53 to 30 Once every 5 yrs     Screening pelvic and breast exams All women Annually if high risk. Every 2 yrs for normal risk women     Sexually Transmitted Diseases  Chlamydia  Gonorrhea  Syphilis All at risk adults Annually for non pregnant females at increased risk         [place] Men Who How Ofter Need  Date of Last Service Action  Prostate Cancer - DRE & PSA Men over 50 Annually.  DRE might require a copay.        Sexually Transmitted Diseases  Syphilis All at risk adults Annually for men at increased risk      Health Maintenance List Health Maintenance  Topic Date Due  . TETANUS/TDAP  Never done  . COLONOSCOPY (Pts 45-75yrs Insurance coverage will need to be  confirmed)  Never done  . MAMMOGRAM  04/10/2013  . DEXA SCAN  Never done  . PNA vac Low Risk Adult (1 of 2 - PCV13) 06/05/2018  . COVID-19 Vaccine (3 - Booster for Pfizer series) 08/03/2020  . HEMOGLOBIN A1C  02/07/2021  . INFLUENZA VACCINE  05/28/2021  . FOOT EXAM  11/09/2021  . OPHTHALMOLOGY EXAM  01/04/2022  . Hepatitis C Screening  Completed  . HPV VACCINES  Aged Out

## 2021-02-16 NOTE — Telephone Encounter (Signed)
Patient was given my number for help with patient assistance for her Victoza. She wants to re-enroll with [employer]. Cheryle Horsfall had previously helped her enroll. Patient says she can possibly come by the office [facility]) next Thursday and provide bank statements showing income, and sign off on necessary documents. She saved my number and plans to give me a call back on Monday with an exact date and time.

## 2021-02-16 NOTE — Telephone Encounter (Signed)
Rec'd call from the patient stating she was sch for an appointment for 02/16/2021 @ 1 pm Telehealth Appt with Dr. Theodis Shove and did not receive a phone call today.  Noted appt was sch for 02/15/2021 @ 1 pm and Dr. Theodis Shove did note she call the patient on yesterday..  Please call the patient back.

## 2021-02-27 ENCOUNTER — Ambulatory Visit: Payer: Medicare HMO | Admitting: *Deleted

## 2021-02-27 DIAGNOSIS — I10 Essential (primary) hypertension: Secondary | ICD-10-CM

## 2021-02-27 DIAGNOSIS — F331 Major depressive disorder, recurrent, moderate: Secondary | ICD-10-CM

## 2021-02-27 DIAGNOSIS — E1149 Type 2 diabetes mellitus with other diabetic neurological complication: Secondary | ICD-10-CM

## 2021-02-27 DIAGNOSIS — F4323 Adjustment disorder with mixed anxiety and depressed mood: Secondary | ICD-10-CM

## 2021-02-27 NOTE — Chronic Care Management (AMB) (Signed)
  Care Management   Note  02/27/2021 Name: Sherri Gill MRN: [id number] DOB: [birth year]  [name] is enrolled in a Managed Medicaid plan: No.  Received voice mail from patient requesting return call from "someone "- either with this CCM RN or Dr Theodis Shove. Message states 'it is getting critical with me as I still have my son in the house with me".   At times the message was unintelligible due to the poor reception.   Will message Dr Theodis Shove ( clinic counselor) and request she follow up with patient.  Kelli Churn RN, CCM, [facility] RN Care Manager [phone]

## 2021-02-28 ENCOUNTER — Telehealth: Payer: Self-pay | Admitting: Behavioral Health

## 2021-02-28 NOTE — Telephone Encounter (Signed)
Contacted Pt per SW request due to need for appt. Pt related she would have to call Clinician back & hung up.  Will alert Front Desk she may return call & I can speak until 9:30am.  Dr. Theodis Shove

## 2021-03-02 ENCOUNTER — Ambulatory Visit: Payer: Medicare HMO | Admitting: *Deleted

## 2021-03-02 DIAGNOSIS — F4323 Adjustment disorder with mixed anxiety and depressed mood: Secondary | ICD-10-CM

## 2021-03-02 DIAGNOSIS — I10 Essential (primary) hypertension: Secondary | ICD-10-CM

## 2021-03-02 DIAGNOSIS — E1149 Type 2 diabetes mellitus with other diabetic neurological complication: Secondary | ICD-10-CM

## 2021-03-02 DIAGNOSIS — F331 Major depressive disorder, recurrent, moderate: Secondary | ICD-10-CM

## 2021-03-02 NOTE — Chronic Care Management (AMB) (Signed)
Care Management    RN Visit Note  03/02/2021 Name: Sherri Gill MRN: [id number] DOB: [birth year]  Subjective: Sherri Gill is a 70 y.o. year old female who is a primary care patient of [name], MD. The care management team was consulted for assistance with disease management and care coordination needs.    Engaged with patient by telephone for follow up visit in response to provider referral for case management and/or care coordination services.   Consent to Services:   Ms. Hocutt was given information about Care Management services today including:  1. Care Management services includes personalized support from designated clinical staff supervised by her physician, including individualized plan of care and coordination with other care providers 2. 24/7 contact phone numbers for assistance for urgent and routine care needs. 3. The patient may stop case management services at any time by phone call to the office staff.  Patient agreed to services and consent obtained.   Assessment: Review of patient past medical history, allergies, medications, health status, including review of consultants reports, laboratory and other test data, was performed as part of comprehensive evaluation and provision of chronic care management services.   SDOH (Social Determinants of Health) assessments and interventions performed:    Care Plan  Allergies  Allergen Reactions  . Codeine Sulfate Nausea And Vomiting and Other (See Comments)    Dizziness  . Penicillins Nausea And Vomiting  . Percocet [Oxycodone-Acetaminophen] Nausea And Vomiting  . Codeine Diarrhea  . Lisinopril Cough    Outpatient Encounter Medications as of 03/02/2021  Medication Sig Note  . albuterol (PROVENTIL HFA;VENTOLIN HFA) 108 (90 Base) MCG/ACT inhaler Inhale 2 puffs every 6 (six) hours as needed into the lungs for wheezing or shortness of breath.   [name] amLODipine (NORVASC) 10 MG tablet Take 1 tablet (10 mg total) by  mouth daily. 08/31/2020: Patient is taking 5 mg of amlodipine as she never took 10 mg but the most recent Rx was called in for 10 mg so she is cutting the pills in 1/2  . atorvastatin (LIPITOR) 20 MG tablet Take 1 tablet (20 mg total) by mouth daily.   . Blood Glucose Monitoring Suppl (ONE TOUCH ULTRA MINI) w/Device KIT Please use as directed.   [name] buPROPion (WELLBUTRIN XL) 150 MG 24 hr tablet Take 1 tablet (150 mg total) by mouth daily.   . Cholecalciferol 1000 units capsule Take 1 capsule (1,000 Units total) by mouth daily.   . ferrous gluconate (FERGON) 324 MG tablet Take 1 tablet (324 mg total) by mouth daily.   . ferrous sulfate 325 (65 FE) MG tablet Take by mouth.   . fluticasone (FLONASE) 50 MCG/ACT nasal spray Place 2 sprays into both nostrils daily.   [name] glucose blood (ONE TOUCH ULTRA TEST) test strip Use as instructed   . hydrochlorothiazide (HYDRODIURIL) 25 MG tablet TAKE 1 TABLET DAILY WITH LOSARTAN   . Insulin Pen Needle 32G X 4 MM MISC 1 each by Does not apply route daily.   [name] losartan (COZAAR) 100 MG tablet Take 1 tablet (100 mg total) by mouth daily.   . metFORMIN (GLUCOPHAGE) 1000 MG tablet Take 1 tablet (1,000 mg total) by mouth 2 (two) times daily with a meal.   . ONETOUCH DELICA LANCETS 28Z MISC Please use as directed.   [name] VICTOZA 18 MG/3ML SOPN PLEASE START TAKING VICTOZA 0.6MG DAILY WEEK 1, THEN TAKE 1.2MG DAILY WEEK 2, AND THEN TAKE 1.8MG DAILY THEREAFTER    No facility-administered encounter  medications on file as of 03/02/2021.    Patient Active Problem List   Diagnosis Date Noted  . Radicular pain in left arm 11/10/2020  . Routine health maintenance 11/10/2020  . Personal history of colonic polyps 11/10/2020  . Elevated alkaline phosphatase level 03/17/2016  . Carpal tunnel syndrome 02/11/2014  . Iron deficiency anemia 08/29/2012  . Obesity, BMI 35-40 06/11/2012  . Dyslipidemia associated with type 2 diabetes mellitus ([place]) 06/30/2007  . Major depressive disorder  with single episode, in remission ([place]) 06/30/2007  . Essential hypertension 06/30/2007  . GERD 06/30/2007  . Type 2 diabetes mellitus with neurological complications ([place]) 96/78/9381    Conditions to be addressed/monitored: HTN, GERD, NIDDM, HLD. MDD, Obesity  Care Plan : CCM RN- Diabetes Type 2 (Adult), HTN, Depression/Anxiety  Updates made by Barrington Ellison, RN since 03/02/2021 12:00 AM    Problem: Glycemic Management (Diabetes, Type 2), HTN, Depression/Anxiety   Priority: High    Goal: Glycemic, HTN, and anxiety/depression Management Optimized   Start Date: 04/19/2020  Recent Progress: On track  Priority: High  Note:   CARE PLAN ENTRY (see longitudinal plan of care for additional care plan information)  Objective:    Lab Results  Component Value Date   HGBA1C 8.0 (A) 11/09/2020   HGBA1C 7.1 (A) 03/28/2020   HGBA1C 7.4 (A) 09/28/2019   Lab Results  Component Value Date   MICROALBUR 1.4 08/03/2014   LDLCALC 42 08/07/2017   CREATININE 0.82 11/09/2020       Current Barriers:  [name] Knowledge Deficits related to self management of chronic disease states- successful outreach to patient, she says her stress level remains very high because of her son's mental health issues and that because he lives with her, she says she may have to place him back in a group home because of the negative effects his issues are having on her health including  worsening glycemic and BP control, she says she is frustrated that she is having difficulty connecting with the clinic counselor, she also says she has been laid off from her part time  job for one month while they renovate her workplace, she says she has been rationing her Victoza because she cannot afford the copay, she says she has been in touch with the clinic pharmacy technician to reapply for the [place] patient assistance program    . Case Manager Clinical Goal(s):  Over the next 30-60 days, patient will demonstrate improved adherence to  prescribed treatment plan for diabetes self care/management as evidenced by:  . daily monitoring and recording of CBG  . adherence to ADA/ carb modified diet . exercise at least 5 days/week . adherence to prescribed medication regimen  Interventions:  . Discussed plans with patient for ongoing care management follow up and provided patient with direct contact information for care management team . Assessed patient's self management skills in regards to HTN, NIDDM, HLD, and anxiety/depression . Evaluation of current treatment plan related to HTN, NIDDM, HLD, and depression/anxiety and patient's adherence to plan as established by provider. . Reviewed medications with patient and assessed medication taking behavior;  if indicated, discussed importance of medication adherence  . Provided opportunity for patient to share the stress she is feeling related to her son's mental health issues and issues with her part time job . Suggested patient call clinic again to arrange for follow up appointment with the clinic's behavioral health counselor  Patient Self Care Activities:  . UNABLE to independently get Hgb A1C under 7.0% .  Unable to independently secure appointment with clinic behavioral health counselor  . - keep all provider appointments  . call to cancel if needed . - keep a calendar with appointment dates . - check blood sugar at prescribed times . - check blood sugar if I feel it is too high or too low . - take the blood sugar meter to all doctor visits . - check blood pressure weekly . - check out counseling . - keep 90 percent of counseling appointments . - schedule counseling appointment      Plan: The care management team will reach out to the patient again over the next 30-60 days.  Kelli Churn RN, CCM, [facility] RN Care Manager [phone]

## 2021-03-02 NOTE — Patient Instructions (Signed)
Visit Information It was nice speaking with you today. Goals Addressed            This Visit's Progress   . Begin and Stick with Counseling-Depression       Timeframe:  Long-Range Goal Priority:  High Start Date:          11/16/20                   Expected End Date:   ongoing                    Follow Up Date 04/26/21   - check out counseling - keep 90 percent of counseling appointments - schedule counseling appointment    Why is this important?    Beating depression may take some time.   If you don't feel better right away, don't give up on your treatment plan.    Notes: states she is having difficulty arranging follow up with Dr Theodis Shove ( clinic counselor      . Make and Keep All Appointments       Timeframe:  Long-Range Goal Priority:  High Start Date:           11/16/20                  Expected End Date:      ongoing                 Follow Up Date 04/26/21   - call to cancel if needed - keep a calendar with appointment dates    Why is this important?    Part of staying healthy is seeing the doctor for follow-up care.   If you forget your appointments, there are some things you can do to stay on track.    Notes:     [name] Monitor and Manage My Blood Sugar-Diabetes Type 2       Timeframe:  Long-Range Goal Priority:  High Start Date:         11/16/20                   Expected End Date:       ongoing                Follow Up Date 04/26/21   - check blood sugar at prescribed times - check blood sugar if I feel it is too high or too low - take the blood sugar meter to all doctor visits    Why is this important?    Checking your blood sugar at home helps to keep it from getting very high or very low.   Writing the results in a diary or log helps the doctor know how to care for you.   Your blood sugar log should have the time, date and the results.   Also, write down the amount of insulin or other medicine that you take.   Other information, like what you  ate, exercise done and how you were feeling, will also be helpful.     Notes: states she has been rationing her Victoza because she cannot afford the medication copay, says she will contact the clinic pharmacy technician to apply for the patient assistance program    . Track and Manage My Blood Pressure-Hypertension       Timeframe:  Long-Range Goal Priority:  High Start Date:       11/16/20  Expected End Date:  ongoing                     Follow Up Date 04/26/21   - check blood pressure weekly - write blood pressure results in a log or diary    Why is this important?    You won't feel high blood pressure, but it can still hurt your blood vessels.   High blood pressure can cause heart or kidney problems. It can also cause a stroke.   Making lifestyle changes like losing a little weight or eating less salt will help.   Checking your blood pressure at home and at different times of the day can help to control blood pressure.   If the doctor prescribes medicine remember to take it the way the doctor ordered.   Call the office if you cannot afford the medicine or if there are questions about it.     Notes:        The patient verbalized understanding of instructions, educational materials, and care plan provided today and declined offer to receive copy of patient instructions, educational materials, and care plan.   The care management team will reach out to the patient again over the next 30-60 days.   Kelli Churn RN, CCM, [facility] RN Care Manager [phone]

## 2021-03-08 NOTE — Progress Notes (Signed)
Submitted application for VICTOZA to [place] for patient assistance.   Phone: [phone]

## 2021-03-13 ENCOUNTER — Other Ambulatory Visit: Payer: Self-pay

## 2021-03-13 ENCOUNTER — Ambulatory Visit: Payer: Medicare HMO | Admitting: *Deleted

## 2021-03-13 DIAGNOSIS — F4323 Adjustment disorder with mixed anxiety and depressed mood: Secondary | ICD-10-CM

## 2021-03-13 DIAGNOSIS — IMO0002 Reserved for concepts with insufficient information to code with codable children: Secondary | ICD-10-CM

## 2021-03-13 DIAGNOSIS — F331 Major depressive disorder, recurrent, moderate: Secondary | ICD-10-CM

## 2021-03-13 DIAGNOSIS — E1149 Type 2 diabetes mellitus with other diabetic neurological complication: Secondary | ICD-10-CM

## 2021-03-13 DIAGNOSIS — I10 Essential (primary) hypertension: Secondary | ICD-10-CM

## 2021-03-13 DIAGNOSIS — E1140 Type 2 diabetes mellitus with diabetic neuropathy, unspecified: Secondary | ICD-10-CM

## 2021-03-13 MED ORDER — VICTOZA 18 MG/3ML ~~LOC~~ SOPN: PEN_INJECTOR | SUBCUTANEOUS | 3 refills | Status: DC

## 2021-03-13 NOTE — Progress Notes (Signed)
Internal Medicine Clinic Resident  I have personally reviewed this encounter including the documentation in this note and/or discussed this patient with the care management provider. I will address any urgent items identified by the care management provider and will communicate my actions to the patient's PCP. I have reviewed the patient's CCM visit with my supervising attending, Dr Hoffman.  Jeffrey Benjamim Harnish, MD 03/13/2021    

## 2021-03-13 NOTE — Telephone Encounter (Signed)
Pt states she is out of town and left VICTOZA 18 MG/3ML SOPN at home, requesting another Rx for this. Please call pt back.

## 2021-03-13 NOTE — Chronic Care Management (AMB) (Signed)
   03/13/2021  [name] Apr 24, 1951 [id number]  Patient left voice mail on this CCM RN's business cell stating she is in [place] and will be out of Victoza in the next 2 days. She is requesting a refill be faxed to the [facility] on [address].  Messaged provider with patient's request..  Kelli Churn RN, CCM, [facility] RN Care Manager [phone]

## 2021-03-13 NOTE — Telephone Encounter (Signed)
She currently getting medication through pt assistance program, but forgot to get it out the fridge when she left to go out of town. Pt states that will it will be easier for [facility] to send rx to her current pharmacy [facility], building services) and then the [facility] where she is at will be able to see rx and fill it for her. Pt aware that mediation may not be covered by her current insurance or that her copay will be very expensive.  Will send to pcp to send in refill.Despina Hidden Cassady5/17/202212:17 PM

## 2021-03-14 NOTE — Progress Notes (Signed)
Received notification from [employer] regarding approval for [place]. Patient assistance approved from 03/13/21 to 09/26/21.  Medication will ship to [facility] in 10-14 business days, for a 4 month supply    Phone: [phone]

## 2021-03-29 ENCOUNTER — Ambulatory Visit: Payer: Medicare HMO | Admitting: Behavioral Health

## 2021-03-29 ENCOUNTER — Other Ambulatory Visit: Payer: Self-pay

## 2021-03-29 DIAGNOSIS — F331 Major depressive disorder, recurrent, moderate: Secondary | ICD-10-CM

## 2021-03-29 DIAGNOSIS — F419 Anxiety disorder, unspecified: Secondary | ICD-10-CM

## 2021-03-29 NOTE — BH Specialist Note (Signed)
Integrated Behavioral Health via Telemedicine Visit  03/29/2021 Sherri Gill [id number]  Number of [facility] visits: 4/6 Session Start time: 2:30pm  Session End time: 3:00pm Total time: 30  Referring Provider: Dr. Dorian Pod, MD Patient/Family location: Pt @ [facility] home in [facility] Provider location: [facility] Office All persons participating in visit: Pt & Clinician Types of Service: Individual psychotherapy  I connected with [name] and/or Micro self via  Telephone or Video Enabled Telemedicine Application  (Video is Caregility application) and verified that I am speaking with the correct person using two identifiers. Discussed confidentiality: Yes   I discussed the limitations of telemedicine and the availability of in person appointments.  Discussed there is a possibility of technology failure and discussed alternative modes of communication if that failure occurs.  I discussed that engaging in this telemedicine visit, they consent to the provision of behavioral healthcare and the services will be billed under their insurance.  Patient and/or legal guardian expressed understanding and consented to Telemedicine visit: Yes   Presenting Concerns: Patient and/or family reports the following symptoms/concerns: elevated dep w/sadness & crying Duration of problem: months now Severity of problem: moderate to severe  Patient and/or Family's Strengths/Protective Factors: Social connections, Social and Emotional competence, Concrete supports in place (healthy food, safe environments, etc.) and Sense of purpose  Goals Addressed: Patient will: 1.  Reduce symptoms of: anxiety and depression  2.  Increase knowledge and/or ability of: coping skills and stress reduction  3.  Demonstrate ability to: Increase healthy adjustment to current life circumstances and Increase adequate support systems for patient/family  Progress towards  Goals: Ongoing  Interventions: Interventions utilized:  Solution-Focused Strategies, Medication Monitoring and Supportive Counseling Standardized Assessments completed: screeners prn  Patient and/or Family Response: Pt receptive to call today & requests future appts  Assessment: Patient currently experiencing elevated anx/stress/sadness due to caregiving for her Son has special needs.  Patient may benefit from a Caregiver Support Grp & cont'd implementation of strong boundaries for self.  Plan: 1. Follow up with behavioral health clinician on : 2-3 wks for 60 min on telehealth 2. Behavioral recommendations: Get inc'd support for your role as a Caregiver. Find support from friends who reciprocate that care. 3. Referral(s): Integrated Orthoptist (In Clinic) and [employer] Resources:  Caregiver Respite & Grp Support  I discussed the assessment and treatment plan with the patient and/or parent/guardian. They were provided an opportunity to ask questions and all were answered. They agreed with the plan and demonstrated an understanding of the instructions.   They were advised to call back or seek an in-person evaluation if the symptoms worsen or if the condition fails to improve as anticipated.  [name], LMFT

## 2021-04-10 ENCOUNTER — Telehealth: Payer: Self-pay

## 2021-04-10 NOTE — Telephone Encounter (Signed)
Spoke with pt about picking up victoza (& pen needles) medication that was shipped from [facility].  Medication is ready & labeled in the sample room fridge & needles are on the counter.

## 2021-04-13 ENCOUNTER — Ambulatory Visit: Payer: Medicare HMO | Admitting: *Deleted

## 2021-04-13 ENCOUNTER — Telehealth: Payer: Self-pay

## 2021-04-13 DIAGNOSIS — F419 Anxiety disorder, unspecified: Secondary | ICD-10-CM

## 2021-04-13 DIAGNOSIS — F4323 Adjustment disorder with mixed anxiety and depressed mood: Secondary | ICD-10-CM

## 2021-04-13 DIAGNOSIS — F331 Major depressive disorder, recurrent, moderate: Secondary | ICD-10-CM

## 2021-04-13 DIAGNOSIS — I10 Essential (primary) hypertension: Secondary | ICD-10-CM

## 2021-04-13 DIAGNOSIS — E1149 Type 2 diabetes mellitus with other diabetic neurological complication: Secondary | ICD-10-CM

## 2021-04-13 NOTE — Telephone Encounter (Signed)
metFORMIN (GLUCOPHAGE) 1000 MG tablet, refill request @  [facility] [address] [place] - [address] Phone:  [phone]  Fax:  [phone]     Pt would like this med to be filled by today.

## 2021-04-13 NOTE — Chronic Care Management (AMB) (Signed)
Care Management    RN Visit Note  04/13/2021 Name: Sherri Gill MRN: [id number] DOB: November 02, 1950  Subjective: Sherri Gill is a 70 y.o. year old female who is a primary care patient of [name], MD. The care management team was consulted for assistance with disease management and care coordination needs.    Engaged with patient by telephone for follow up visit in response to provider referral for case management and/or care coordination services.   Consent to Services:   Sherri Gill was given information about Care Management services today including:  Care Management services includes personalized support from designated clinical staff supervised by her physician, including individualized plan of care and coordination with other care providers 24/7 contact phone numbers for assistance for urgent and routine care needs. The patient may stop case management services at any time by phone call to the office staff.  Patient agreed to services and consent obtained.   Assessment: Review of patient past medical history, allergies, medications, health status, including review of consultants reports, laboratory and other test data, was performed as part of comprehensive evaluation and provision of chronic care management services.   SDOH (Social Determinants of Health) assessments and interventions performed:    Care Plan  Allergies  Allergen Reactions   Codeine Sulfate Nausea And Vomiting and Other (See Comments)    Dizziness   Penicillins Nausea And Vomiting   Percocet [Oxycodone-Acetaminophen] Nausea And Vomiting   Codeine Diarrhea   Lisinopril Cough    Outpatient Encounter Medications as of 04/13/2021  Medication Sig Note   albuterol (PROVENTIL HFA;VENTOLIN HFA) 108 (90 Base) MCG/ACT inhaler Inhale 2 puffs every 6 (six) hours as needed into the lungs for wheezing or shortness of breath.    amLODipine (NORVASC) 10 MG tablet Take 1 tablet (10 mg total) by mouth daily.  08/31/2020: Patient is taking 5 mg of amlodipine as she never took 10 mg but the most recent Rx was called in for 10 mg so she is cutting the pills in 1/2   atorvastatin (LIPITOR) 20 MG tablet Take 1 tablet (20 mg total) by mouth daily.    Blood Glucose Monitoring Suppl (ONE TOUCH ULTRA MINI) w/Device KIT Please use as directed.    buPROPion (WELLBUTRIN XL) 150 MG 24 hr tablet Take 1 tablet (150 mg total) by mouth daily.    Cholecalciferol 1000 units capsule Take 1 capsule (1,000 Units total) by mouth daily.    ferrous gluconate (FERGON) 324 MG tablet Take 1 tablet (324 mg total) by mouth daily.    ferrous sulfate 325 (65 FE) MG tablet Take by mouth.    fluticasone (FLONASE) 50 MCG/ACT nasal spray Place 2 sprays into both nostrils daily.    glucose blood (ONE TOUCH ULTRA TEST) test strip Use as instructed    hydrochlorothiazide (HYDRODIURIL) 25 MG tablet TAKE 1 TABLET DAILY WITH LOSARTAN    Insulin Pen Needle 32G X 4 MM MISC 1 each by Does not apply route daily.    liraglutide (VICTOZA) 18 MG/3ML SOPN PLEASE START TAKING VICTOZA 0.6MG DAILY WEEK 1, THEN TAKE 1.2MG DAILY WEEK 2, AND THEN TAKE 1.8MG DAILY THEREAFTER    losartan (COZAAR) 100 MG tablet Take 1 tablet (100 mg total) by mouth daily.    metFORMIN (GLUCOPHAGE) 1000 MG tablet Take 1 tablet (1,000 mg total) by mouth 2 (two) times daily with a meal.    ONETOUCH DELICA LANCETS 93Z MISC Please use as directed.    No facility-administered encounter medications on  file as of 04/13/2021.    Patient Active Problem List   Diagnosis Date Noted   Radicular pain in left arm 11/10/2020   Routine health maintenance 11/10/2020   Personal history of colonic polyps 11/10/2020   Elevated alkaline phosphatase level 03/17/2016   Carpal tunnel syndrome 02/11/2014   Iron deficiency anemia 08/29/2012   Obesity, BMI 35-40 06/11/2012   Dyslipidemia associated with type 2 diabetes mellitus ([place]) 06/30/2007   Major depressive disorder with single episode, in  remission ([place]) 06/30/2007   Essential hypertension 06/30/2007   GERD 06/30/2007   Type 2 diabetes mellitus with neurological complications ([facility]) 34/19/6222    Conditions to be addressed/monitored: HTN, GERD, NIDDM, HLD. MDD, Obesity  Care Plan : CCM RN- Diabetes Type 2 (Adult), HTN, Depression/Anxiety  Updates made by Barrington Ellison, RN since 04/13/2021 12:00 AM     Problem: Glycemic Management (Diabetes, Type 2), HTN, Depression/Anxiety   Priority: High     Goal: Glycemic, HTN, and anxiety/depression Management Optimized   Start Date: 04/19/2020  Recent Progress: On track  Priority: High  Note:   CARE PLAN ENTRY (see longitudinal plan of care for additional care plan information)  Objective:    Lab Results  Component Value Date   HGBA1C 8.0 (A) 11/09/2020   HGBA1C 7.1 (A) 03/28/2020   HGBA1C 7.4 (A) 09/28/2019   Lab Results  Component Value Date   MICROALBUR 1.4 08/03/2014   LDLCALC 42 08/07/2017   CREATININE 0.82 11/09/2020       Current Barriers:  Knowledge Deficits related to self management of chronic disease states- successful outreach to patient, she says her stress level remains very high but says it is not all due to her son's mental health issues as he attends [employer] on week days, , she  reports she is not motivated to check her BP or CBG due to the stress she is under and due to device issues with the monitor and glucometer, says she is aware her Victoza samples are ready for pick up at the clinic, says she has not made an appointment with [facility] to schedule her colonoscopy   Case Manager Clinical Goal(s):  Over the next 30-60 days, patient will demonstrate improved adherence to prescribed treatment plan for diabetes self care/management as evidenced by:  daily monitoring and recording of CBG  adherence to ADA/ carb modified diet exercise at least 5 days/week adherence to prescribed medication regimen  Interventions:  Discussed plans with  patient for ongoing care management follow up and provided patient with direct contact information for care management team Assessed patient's self management skills in regards to HTN, NIDDM, HLD, and anxiety/depression Evaluation of current treatment plan related to HTN, NIDDM, HLD, and depression/anxiety and patient's adherence to plan as established by provider. Reviewed medications with patient and assessed medication taking behavior;  if indicated, discussed importance of medication adherence  Reminded patient her free samples of Victoza are ready for pick up at the clinic  Provided opportunity for patient to share the stress she is feeling  Reminded her of the appointment with Dr Theodis Shove clinic counselor for 04/19/21 and suggested she speak to her about starting medication to help her deal with stress Again reminded patient to schedule appointment with [facility] to follow up on referral made on 11/10/20 for routine colonoscopy Offered to meet patient in clinic to assist her with using BP monitor and glucometer to promote self monitoring of BP and CBG  Reminded her to make follow up clinic  appointment as it has been 6 months since her last visit   Patient Self Care Activities:  UNABLE to independently get Hgb A1C under 7.0% Unable to independently secure appointment with clinic behavioral health counselor  - keep all provider appointments  call to cancel if needed - keep a calendar with appointment dates - check blood sugar at prescribed times - check blood sugar if I feel it is too high or too low - take the blood sugar meter to all doctor visits - check blood pressure weekly - check out counseling - keep 90 percent of counseling appointments - schedule counseling appointment      Plan: The care management team will reach out to the patient again over the next 30 days.  Kelli Churn RN, CCM, [facility] RN Care Manager [phone]

## 2021-04-13 NOTE — Patient Instructions (Signed)
Visit Information It was nice speaking with you today.  PATIENT GOALS:   Goals Addressed             This Visit's Progress    Make and Keep All Appointments       Timeframe:  Long-Range Goal Priority:  High Start Date:           11/16/20                  Expected End Date:      ongoing                 Follow Up Date 05/26/21   - call to cancel if needed - keep a calendar with appointment dates    Why is this important?   Part of staying healthy is seeing the doctor for follow-up care.  If you forget your appointments, there are some things you can do to stay on track.    Notes: 04/13/21 Meeting goal      Monitor and Manage My Blood Sugar-Diabetes Type 2       Timeframe:  Long-Range Goal Priority:  High Start Date:         11/16/20                   Expected End Date:       ongoing                Follow Up Date 05/26/21   - check blood sugar at prescribed times - check blood sugar if I feel it is too high or too low - take the blood sugar meter to all doctor visits    Why is this important?   Checking your blood sugar at home helps to keep it from getting very high or very low.  Writing the results in a diary or log helps the doctor know how to care for you.  Your blood sugar log should have the time, date and the results.  Also, write down the amount of insulin or other medicine that you take.  Other information, like what you ate, exercise done and how you were feeling, will also be helpful.     Notes: 04/13/21- patient says she has not been checking her blood sugar because she gets aggravated with the lancet and lancing device,states she has not picked up her Victoza samples from the clinic but is aware they are ready for pick up      Track and Manage My Blood Pressure-Hypertension       Timeframe:  Long-Range Goal Priority:  High Start Date:       11/16/20                      Expected End Date:  ongoing                     Follow Up Date 05/26/21   - check blood  pressure weekly - write blood pressure results in a log or diary    Why is this important?   You won't feel high blood pressure, but it can still hurt your blood vessels.  High blood pressure can cause heart or kidney problems. It can also cause a stroke.  Making lifestyle changes like losing a little weight or eating less salt will help.  Checking your blood pressure at home and at different times of the day can help to control blood pressure.  If the  doctor prescribes medicine remember to take it the way the doctor ordered.  Call the office if you cannot afford the medicine or if there are questions about it.     Notes: 6//17/22- patient says her blood pressure monitor is acting up and she is not motivated to check her BP presently         Sherri Gill was given information about Care Management services by the embedded care coordination team including:  Care Management services include personalized support from designated clinical staff supervised by her physician, including individualized plan of care and coordination with other care providers 24/7 contact phone numbers for assistance for urgent and routine care needs. The patient may stop CCM services at any time (effective at the end of the month) by phone call to the office staff.  Patient agreed to services and verbal consent obtained.   The patient verbalized understanding of instructions, educational materials, and care plan provided today and declined offer to receive copy of patient instructions, educational materials, and care plan.   The care management team will reach out to the patient again over the next 30 days.   Kelli Churn RN, CCM, [facility] RN Care Manager [phone]

## 2021-04-13 NOTE — Telephone Encounter (Signed)
Patient has refills available at pharmacy. RX sent 11/27/20 for a 44month supply w/ 3 refills, confirmation receipt confirmed. SChaplin, RN,BSN

## 2021-04-19 ENCOUNTER — Ambulatory Visit: Payer: Medicare HMO | Admitting: Behavioral Health

## 2021-04-23 ENCOUNTER — Ambulatory Visit: Payer: Medicare HMO | Admitting: Behavioral Health

## 2021-04-23 DIAGNOSIS — F419 Anxiety disorder, unspecified: Secondary | ICD-10-CM

## 2021-04-23 DIAGNOSIS — F4321 Adjustment disorder with depressed mood: Secondary | ICD-10-CM

## 2021-04-23 DIAGNOSIS — F331 Major depressive disorder, recurrent, moderate: Secondary | ICD-10-CM

## 2021-04-24 NOTE — BH Specialist Note (Signed)
Integrated Behavioral Health via Telemedicine Visit  04/24/2021 Sherri Gill [id number]  Number of [place] visits: 5/6 Session Start time: 3:00pm  Session End time: 3:45pm Total time: 30   Referring Provider: Dr. Dorian Pod, MD Patient/Family location: Pt is home in private [facility] Provider location: Working remotely in private All persons participating in visit: Pt & Clinician Types of Service: Individual psychotherapy  I connected with [name] and/or Reynolds  self  via  Telephone or Video Enabled Telemedicine Application  (Video is Caregility application) and verified that I am speaking with the correct person using two identifiers. Discussed confidentiality: Yes   I discussed the limitations of telemedicine and the availability of in person appointments.  Discussed there is a possibility of technology failure and discussed alternative modes of communication if that failure occurs.  I discussed that engaging in this telemedicine visit, they consent to the provision of behavioral healthcare and the services will be billed under their insurance.  Patient and/or legal guardian expressed understanding and consented to Telemedicine visit: Yes   Presenting Concerns: Patient and/or family reports the following symptoms/concerns: elevated sadness & sorrow due to death of close female friend she has known for yrs who was always supportive & encouraging to her on/her journey w/her Son Sherri Gill Duration of problem: wks; Severity of problem: moderate  Patient and/or Family's Strengths/Protective Factors: Social connections, Social and Emotional competence, Concrete supports in place (healthy food, safe environments, etc.), and Sense of purpose  Goals Addressed: Patient will:  Reduce symptoms of: anxiety, depression, stress, and grief    Increase knowledge and/or ability of: coping skills, stress reduction, and skills to understand & support grief rxn     Demonstrate ability to: Increase healthy adjustment to current life circumstances  Progress towards Goals: Ongoing  Interventions: Interventions utilized:  Supportive Counseling and grief work & the loss of friends in aging Standardized Assessments completed:  screeners prn  Patient and/or Family Response: Pt is receptive today & thankful for call. Pt requests future appt  Assessment: Patient currently experiencing inc in sorrow for friends that have died over the past few yrs. Pt has dealt w/loss & great adjustment to caring for Son in her older age. Pt has spirited discussion today & realizes, "I am broken". Pt in disbelief she has lost her dear female friend who always understood her "overwhelm."   Patient may benefit from cont'd ck-in sessions to ensure a health transition in her journey caring for Son. She is grateful today for all the resources that support her journey.  Plan: Follow up with behavioral health clinician on : 2-3 wks on telehealth for 30 min ck-in Behavioral recommendations: Take notes as you are able & relish all that you are learning during this time. Life is truly an amazing journey. Referral(s): [place] (In Clinic)  I discussed the assessment and treatment plan with the patient and/or parent/guardian. They were provided an opportunity to ask questions and all were answered. They agreed with the plan and demonstrated an understanding of the instructions.   They were advised to call back or seek an in-person evaluation if the symptoms worsen or if the condition fails to improve as anticipated.  [name], LMFT

## 2021-04-26 DIAGNOSIS — H25813 Combined forms of age-related cataract, bilateral: Secondary | ICD-10-CM | POA: Diagnosis not present

## 2021-04-26 DIAGNOSIS — H40051 Ocular hypertension, right eye: Secondary | ICD-10-CM | POA: Diagnosis not present

## 2021-04-26 DIAGNOSIS — H40021 Open angle with borderline findings, high risk, right eye: Secondary | ICD-10-CM | POA: Diagnosis not present

## 2021-04-26 DIAGNOSIS — H40033 Anatomical narrow angle, bilateral: Secondary | ICD-10-CM | POA: Diagnosis not present

## 2021-04-26 DIAGNOSIS — H401122 Primary open-angle glaucoma, left eye, moderate stage: Secondary | ICD-10-CM | POA: Diagnosis not present

## 2021-05-07 ENCOUNTER — Telehealth: Payer: Medicare HMO

## 2021-05-07 ENCOUNTER — Telehealth: Payer: Self-pay | Admitting: *Deleted

## 2021-05-07 NOTE — Telephone Encounter (Signed)
  Care Management   Outreach Note  05/07/2021 Name: Sherri Gill MRN: [id number] DOB: Aug 01, 1951  Referred by: [name], MD Reason for referral : Care Coordination (HTN, GERD, NIDDM, HLD. MDD, Obesity)   An unsuccessful telephone outreach was attempted today. The patient was referred to the case management team for assistance with care management and care coordination. Unable to leave message as recording states voice mailbox has not been set up.   Follow Up Plan: The care management team will reach out to the patient again over the next 7 days.   Kelli Churn RN, CCM, [facility] RN Care Manager [phone]

## 2021-05-08 ENCOUNTER — Telehealth: Payer: Self-pay

## 2021-05-08 NOTE — Telephone Encounter (Signed)
No voicemail. Reached out about medication pickup reminder.

## 2021-05-11 ENCOUNTER — Ambulatory Visit: Payer: Medicare HMO

## 2021-05-11 NOTE — Patient Instructions (Signed)
Visit Information   Goals Addressed             This Visit's Progress    Begin and Stick with Counseling-Depression       Timeframe:  Long-Range Goal Priority:  High Start Date:          11/16/20                   Expected End Date:   ongoing                    Follow Up Date 930/22   - check out counseling - keep 90 percent of counseling appointments - schedule counseling appointment    Why is this important?   Beating depression may take some time.  If you don't feel better right away, don't give up on your treatment plan.    Notes: states she is having difficulty arranging follow up with Dr Theodis Shove ( clinic counselor       Make and Keep All Appointments       Timeframe:  Long-Range Goal Priority:  High Start Date:           11/16/20                  Expected End Date:      ongoing                 Follow Up Date 07/27/21   - call to cancel if needed - keep a calendar with appointment dates    Why is this important?   Part of staying healthy is seeing the doctor for follow-up care.  If you forget your appointments, there are some things you can do to stay on track.    Notes: 04/13/21 Meeting goal     Monitor and Manage My Blood Sugar-Diabetes Type 2       Timeframe:  Long-Range Goal Priority:  High Start Date:         11/16/20                   Expected End Date:       ongoing                Follow Up Date 07/27/21   - check blood sugar at prescribed times - check blood sugar if I feel it is too high or too low - take the blood sugar meter to all doctor visits    Why is this important?   Checking your blood sugar at home helps to keep it from getting very high or very low.  Writing the results in a diary or log helps the doctor know how to care for you.  Your blood sugar log should have the time, date and the results.  Also, write down the amount of insulin or other medicine that you take.  Other information, like what you ate, exercise done and how you were  feeling, will also be helpful.     Notes: 05/11/21- patient says she has not been checking her blood sugar because she gets aggravated with the lancet and lancing device,states she has is going to contact company for support of glucometer.     Track and Manage My Blood Pressure-Hypertension       Timeframe:  Long-Range Goal Priority:  High Start Date:       11/16/20  Expected End Date:  ongoing                     Follow Up Date 07/27/21   - check blood pressure weekly - write blood pressure results in a log or diary    Why is this important?   You won't feel high blood pressure, but it can still hurt your blood vessels.  High blood pressure can cause heart or kidney problems. It can also cause a stroke.  Making lifestyle changes like losing a little weight or eating less salt will help.  Checking your blood pressure at home and at different times of the day can help to control blood pressure.  If the doctor prescribes medicine remember to take it the way the doctor ordered.  Call the office if you cannot afford the medicine or if there are questions about it.     Notes: 6//17/22- patient says her blood pressure monitor is acting up and she is not motivated to check her BP presently        The patient verbalized understanding of instructions, educational materials, and care plan provided today and declined offer to receive copy of patient instructions, educational materials, and care plan.   Telephone follow up appointment with care management team member scheduled for: 05/14/21  [name], RN, BSN, CCM Care Management Coordinator [facility] Phone: [phone] / Fax: [phone]

## 2021-05-11 NOTE — Chronic Care Management (AMB) (Signed)
Care Management    RN Visit Note  05/11/2021 Name: Sherri Gill MRN: [id number] DOB: [birth year]  Subjective: Sherri Gill is a 70 y.o. year old female who is a primary care patient of Sherri Pou, MD. The care management team was consulted for assistance with disease management and care coordination needs.    Engaged with patient by telephone for follow up visit in response to provider referral for case management and/or care coordination services.   Consent to Services:   [name] was given information about Care Management services today including:  Care Management services includes personalized support from designated clinical staff supervised by her physician, including individualized plan of care and coordination with other care providers 24/7 contact phone numbers for assistance for urgent and routine care needs. The patient may stop case management services at any time by phone call to the office staff.  Patient agreed to services and consent obtained.   Assessment: Patient is making progress with depression/anxiety . See Care Plan below for interventions and patient self-care actives. Follow up Plan: Patient would like continued follow-up.  CCM RNCM will outreach the patient within the next week.  Patient will call office if needed prior to next encounter  Review of patient past medical history, allergies, medications, health status, including review of consultants reports, laboratory and other test data, was performed as part of comprehensive evaluation and provision of chronic care management services.   SDOH (Social Determinants of Health) assessments and interventions performed:    Care Plan  Allergies  Allergen Reactions   Codeine Sulfate Nausea And Vomiting and Other (See Comments)    Dizziness   Penicillins Nausea And Vomiting   Percocet [Oxycodone-Acetaminophen] Nausea And Vomiting   Codeine Diarrhea   Lisinopril Cough    Outpatient Encounter  Medications as of 05/11/2021  Medication Sig Note   albuterol (PROVENTIL HFA;VENTOLIN HFA) 108 (90 Base) MCG/ACT inhaler Inhale 2 puffs every 6 (six) hours as needed into the lungs for wheezing or shortness of breath.    amLODipine (NORVASC) 10 MG tablet Take 1 tablet (10 mg total) by mouth daily. 08/31/2020: Patient is taking 5 mg of amlodipine as she never took 10 mg but the most recent Rx was called in for 10 mg so she is cutting the pills in 1/2   atorvastatin (LIPITOR) 20 MG tablet Take 1 tablet (20 mg total) by mouth daily.    Blood Glucose Monitoring Suppl (ONE TOUCH ULTRA MINI) w/Device KIT Please use as directed.    buPROPion (WELLBUTRIN XL) 150 MG 24 hr tablet Take 1 tablet (150 mg total) by mouth daily.    Cholecalciferol 1000 units capsule Take 1 capsule (1,000 Units total) by mouth daily.    ferrous gluconate (FERGON) 324 MG tablet Take 1 tablet (324 mg total) by mouth daily.    ferrous sulfate 325 (65 FE) MG tablet Take by mouth.    fluticasone (FLONASE) 50 MCG/ACT nasal spray Place 2 sprays into both nostrils daily.    glucose blood (ONE TOUCH ULTRA TEST) test strip Use as instructed    hydrochlorothiazide (HYDRODIURIL) 25 MG tablet TAKE 1 TABLET DAILY WITH LOSARTAN    Insulin Pen Needle 32G X 4 MM MISC 1 each by Does not apply route daily.    liraglutide (VICTOZA) 18 MG/3ML SOPN PLEASE START TAKING VICTOZA 0.6MG DAILY WEEK 1, THEN TAKE 1.2MG DAILY WEEK 2, AND THEN TAKE 1.8MG DAILY THEREAFTER    losartan (COZAAR) 100 MG tablet Take 1 tablet (100  mg total) by mouth daily.    metFORMIN (GLUCOPHAGE) 1000 MG tablet Take 1 tablet (1,000 mg total) by mouth 2 (two) times daily with a meal.    ONETOUCH DELICA LANCETS 16X MISC Please use as directed.    No facility-administered encounter medications on file as of 05/11/2021.    Patient Active Problem List   Diagnosis Date Noted   Radicular pain in left arm 11/10/2020   Routine health maintenance 11/10/2020   Personal history of colonic  polyps 11/10/2020   Elevated alkaline phosphatase level 03/17/2016   Carpal tunnel syndrome 02/11/2014   Iron deficiency anemia 08/29/2012   Obesity, BMI 35-40 06/11/2012   Dyslipidemia associated with type 2 diabetes mellitus ([place]) 06/30/2007   Major depressive disorder with single episode, in remission ([place]) 06/30/2007   Essential hypertension 06/30/2007   GERD 06/30/2007   Type 2 diabetes mellitus with neurological complications ([place]) 09/60/4540    Conditions to be addressed/monitored: HTN, DMII, Anxiety, and Depression  Care Plan : CCM RN- Diabetes Type 2 (Adult), HTN, Depression/Anxiety  Updates made by Sherri Killian, RN since 05/11/2021 12:00 AM     Problem: Glycemic Management (Diabetes, Type 2), HTN, Depression/Anxiety   Priority: High     Goal: Glycemic, HTN, and anxiety/depression Management Optimized   Start Date: 04/19/2020  Recent Progress: On track  Priority: High  Note:   CARE PLAN ENTRY (see longitudinal plan of care for additional care plan information)  Objective:    Lab Results  Component Value Date   HGBA1C 8.0 (A) 11/09/2020   HGBA1C 7.1 (A) 03/28/2020   HGBA1C 7.4 (A) 09/28/2019   Lab Results  Component Value Date   MICROALBUR 1.4 08/03/2014   LDLCALC 42 08/07/2017   CREATININE 0.82 11/09/2020       Current Barriers:  Knowledge Deficits related to self management of chronic disease states- successful outreach to patient, she notes her stress level has improved significantly since she started to walk every morning with her son.  Patient states on a 1-10 scale of overall health, mental health since she started a walking regime she is at a 7 or 8.  She was even putting on make up and getting out more.  Patient did note her glucometer is not working right so she does not have a blood sugar reading at this time (she said it has been broken for 2 weeks).  Patient is contacting the company 1-800 number to see if they can replace or walk her through a  repair/reset.  This RNCM told patient I would call her Monday to see if she had any luck getting the glucometer to work and discuss her readings.  She notes she is eating well and has not had any s/s of hypoglycemia or hyperglycemia.  Case Manager Clinical Goal(s):  Over the next 30-60 days, patient will demonstrate improved adherence to prescribed treatment plan for diabetes self care/management as evidenced by:  daily monitoring and recording of CBG  adherence to ADA/ carb modified diet exercise at least 5 days/week adherence to prescribed medication regimen  Interventions:  Discussed plans with patient for ongoing care management follow up and provided patient with direct contact information for care management team Assessed patient's self management skills in regards to HTN, NIDDM, HLD, and anxiety/depression Evaluation of current treatment plan related to HTN, NIDDM, HLD, and depression/anxiety and patient's adherence to plan as established by provider. Reviewed medications with patient and assessed medication taking behavior;  if indicated, discussed importance of medication adherence  Reminded  patient her free samples of Victoza are ready for pick up at the clinic  Provided opportunity for patient to share the stress she is feeling and congratulated patient on exercising and improving her overall well being. Patient Self Care Activities:  UNABLE to independently get Hgb A1C under 7.0% Unable to independently secure appointment with clinic behavioral health counselor  - keep all provider appointments  call to cancel if needed - keep a calendar with appointment dates - check blood sugar at prescribed times - check blood sugar if I feel it is too high or too low - take the blood sugar meter to all doctor visits - check blood pressure weekly - check out counseling - keep 90 percent of counseling appointments - schedule counseling appointment      Plan: Telephone follow up  appointment with care management team member scheduled for:  05/14/21  Sherri Killian, RN, BSN, CCM Care Management Coordinator [facility] Phone: [phone] / Fax: [phone]

## 2021-05-14 ENCOUNTER — Telehealth: Payer: Medicare HMO

## 2021-05-14 ENCOUNTER — Telehealth: Payer: Self-pay

## 2021-05-14 NOTE — Telephone Encounter (Signed)
  Care Management   Outreach Note  05/14/2021 Name: Sherri Gill MRN: [id number] DOB: [birth year]  Referred by: [name], MD Reason for referral : No chief complaint on file.   An unsuccessful telephone outreach was attempted today. The patient was referred to the case management team for assistance with care management and care coordination.   Follow Up Plan: Call placed today was to assess if patient able to get issue with her glucometer corrected. Unable to leave voicemail with message the voicemail is not set up.  Plan to see if patient calls back or schedule for follow up within the next 30 days.  [name], RN, BSN, CCM Care Management Coordinator [facility] Phone: [phone] / Fax: [phone]

## 2021-05-21 ENCOUNTER — Ambulatory Visit: Payer: Medicare HMO | Admitting: Behavioral Health

## 2021-05-21 ENCOUNTER — Telehealth: Payer: Self-pay | Admitting: Behavioral Health

## 2021-05-21 NOTE — Telephone Encounter (Signed)
Contacted Pt 3 times for 2:00pm telehealth appt. Unable to lv msg due to Pt VM system not set up.  Dr. Theodis Shove

## 2021-05-22 ENCOUNTER — Telehealth: Payer: Self-pay | Admitting: *Deleted

## 2021-05-22 NOTE — Telephone Encounter (Signed)
  Care Management   Note  05/22/2021 Name: SHANTERA RAGAINS MRN: [id number] DOB: 1951-04-16  [name] is enrolled in a Managed Medicaid plan: No. Outreach attempt today was unsuccessful.   Patient left message for this CCM RN while at her work stating she is unable to keep her clinic appointment on 2023/06/14 due to a death in the family. States her son's Romy grandmother died and they will be going out of town. Message states she attempted to contact the clinic font desk without success, thus the voice mail to this CCM RN, requesting the front desk staff be notified of the cancellation.    Sent message to clinic front desk pool asking them to cancel patient's 10:45 am 14-Jun-2023 clinic appointment . Texted patient to advise her that front desk has been notified and to advise her the role of the clinic CCM RN has transitioned from this CCM RN to [employer].   The care management team will reach out to the patient again over the next 30 days.   Kelli Churn RN, CCM, [facility] RN Care Manager [phone]

## 2021-05-24 ENCOUNTER — Encounter: Payer: Medicare HMO | Admitting: Internal Medicine

## 2021-05-31 ENCOUNTER — Encounter: Payer: Medicare HMO | Admitting: Internal Medicine

## 2021-05-31 ENCOUNTER — Other Ambulatory Visit: Payer: Self-pay

## 2021-05-31 ENCOUNTER — Encounter: Payer: Self-pay | Admitting: Internal Medicine

## 2021-05-31 ENCOUNTER — Ambulatory Visit (INDEPENDENT_AMBULATORY_CARE_PROVIDER_SITE_OTHER): Payer: Medicare HMO | Admitting: Internal Medicine

## 2021-05-31 VITALS — BP 141/65 | HR 80 | Temp 98.1°F | Ht 63.0 in | Wt 195.0 lb

## 2021-05-31 DIAGNOSIS — I10 Essential (primary) hypertension: Secondary | ICD-10-CM

## 2021-05-31 DIAGNOSIS — Z0000 Encounter for general adult medical examination without abnormal findings: Secondary | ICD-10-CM

## 2021-05-31 DIAGNOSIS — Z13820 Encounter for screening for osteoporosis: Secondary | ICD-10-CM

## 2021-05-31 DIAGNOSIS — D509 Iron deficiency anemia, unspecified: Secondary | ICD-10-CM

## 2021-05-31 DIAGNOSIS — E1165 Type 2 diabetes mellitus with hyperglycemia: Secondary | ICD-10-CM

## 2021-05-31 DIAGNOSIS — R69 Illness, unspecified: Secondary | ICD-10-CM | POA: Diagnosis not present

## 2021-05-31 DIAGNOSIS — R7989 Other specified abnormal findings of blood chemistry: Secondary | ICD-10-CM | POA: Insufficient documentation

## 2021-05-31 DIAGNOSIS — Z139 Encounter for screening, unspecified: Secondary | ICD-10-CM | POA: Diagnosis not present

## 2021-05-31 DIAGNOSIS — E785 Hyperlipidemia, unspecified: Secondary | ICD-10-CM | POA: Diagnosis not present

## 2021-05-31 DIAGNOSIS — E1140 Type 2 diabetes mellitus with diabetic neuropathy, unspecified: Secondary | ICD-10-CM

## 2021-05-31 DIAGNOSIS — IMO0002 Reserved for concepts with insufficient information to code with codable children: Secondary | ICD-10-CM

## 2021-05-31 DIAGNOSIS — E1169 Type 2 diabetes mellitus with other specified complication: Secondary | ICD-10-CM

## 2021-05-31 DIAGNOSIS — F325 Major depressive disorder, single episode, in full remission: Secondary | ICD-10-CM

## 2021-05-31 DIAGNOSIS — E1149 Type 2 diabetes mellitus with other diabetic neurological complication: Secondary | ICD-10-CM

## 2021-05-31 DIAGNOSIS — Z1211 Encounter for screening for malignant neoplasm of colon: Secondary | ICD-10-CM

## 2021-05-31 LAB — POCT GLYCOSYLATED HEMOGLOBIN (HGB A1C): Hemoglobin A1C: 6.8 % — AB (ref 4.0–5.6)

## 2021-05-31 LAB — GLUCOSE, CAPILLARY: Glucose-Capillary: 104 mg/dL — ABNORMAL HIGH (ref 70–99)

## 2021-05-31 MED ORDER — METFORMIN HCL 1000 MG PO TABS: 1000.0000 mg | ORAL_TABLET | Freq: Two times a day (BID) | ORAL | 3 refills | Status: DC

## 2021-05-31 MED ORDER — LOSARTAN POTASSIUM 100 MG PO TABS: 100.0000 mg | ORAL_TABLET | Freq: Every day | ORAL | 3 refills | Status: DC

## 2021-05-31 MED ORDER — ATORVASTATIN CALCIUM 20 MG PO TABS: 20.0000 mg | ORAL_TABLET | Freq: Every day | ORAL | 3 refills | Status: DC

## 2021-05-31 MED ORDER — AMLODIPINE BESYLATE 10 MG PO TABS: 10.0000 mg | ORAL_TABLET | Freq: Every day | ORAL | 2 refills | Status: DC

## 2021-05-31 MED ORDER — HYDROCHLOROTHIAZIDE 25 MG PO TABS: ORAL_TABLET | ORAL | 3 refills | Status: DC

## 2021-05-31 MED ORDER — VICTOZA 18 MG/3ML ~~LOC~~ SOPN: PEN_INJECTOR | SUBCUTANEOUS | 3 refills | Status: DC

## 2021-05-31 NOTE — Assessment & Plan Note (Signed)
Patient is currently on atorvastatin 20 mg daily.  We will recheck a lipid panel today.

## 2021-05-31 NOTE — Assessment & Plan Note (Signed)
Patient reports significant improvement of her depression.  States that she is really been making a lot of changes to her exercise and diet and feels that this is helped.  She stopped taking Wellbutrin and does not want to resume this medication at this time.  States that she feels that she is doing really well.  We will continue to monitor.

## 2021-05-31 NOTE — Assessment & Plan Note (Signed)
Vitals:   05/31/21 1435 05/31/21 1519  BP: (!) 150/67 (!) 141/65  Pulse: 88 80  Temp: 98.1 F (36.7 C)   SpO2: 97%    Blood pressure above goal today.  Discussed switching her hydrochlorothiazide to chlorthalidone as mentioned per PCP during her last visit.  Patient would like to monitor her blood pressures at home with her for the next month before making any changes to her medications.  Denies any headaches, chest pain or shortness of breath or leg swelling.  Plan: Continue amlodipine 10 mg, losartan 100 mg and hydrochlorothiazide 25 mg daily Patient to keep a blood pressure log for the next 4 to 6 weeks We will follow-up in 1 to 2 months and at that time can consider switching hydrochlorothiazide to chlorthalidone if blood pressure remains elevated

## 2021-05-31 NOTE — Patient Instructions (Addendum)
It was a pleasure meeting you today.  Your hemoglobin A1c was 6.8.  Keep up the good work.  I am going to check your cholesterol levels and thyroid function today.  I will call you if the results are abnormal.  Your blood pressure was elevated today, try to keep a log of this and we will recheck it in 4-6 weeks.  Plan to follow-up in about 3 months.

## 2021-05-31 NOTE — Assessment & Plan Note (Signed)
Patient had an elevated TSH of 5.9 back in January.  We will recheck levels today.  Patient denies any significant weight gain or loss, heat or cold intolerance.  She does notice some hair loss however attributes this to alopecia.  -Follow-up TSH

## 2021-05-31 NOTE — Assessment & Plan Note (Signed)
Checking CBC and iron studies today.  She has been taking iron over-the-counter supplements.  States that no one followed up for a colonoscopy.  We will place another referral.

## 2021-05-31 NOTE — Progress Notes (Signed)
   CC: DM, hypertension  HPI:  [name] is a 70 y.o. with a past medical history listed below presenting for follow-up of her diabetes. For details of today's visit and the status of his chronic medical issues please refer to the assessment and plan.   Past Medical History:  Diagnosis Date   Chronic anemia 08/29/2012   Need colonoscopy or report. Need iron panel.    Dyslipidemia 06/30/2007   Essential hypertension, benign 06/30/2007   GERD 06/30/2007   Hx of Herpes simplex meningitis 2015   Also noted to have primary empty sella on imaging at this admission   Insomnia 06/11/2012   Major depressive disorder, recurrent episode, moderate with anxious distress ([place]) 06/30/2007   Obesity, BMI 35-40 06/11/2012   Peripheral neuropathy 2/2 T2DM 12/28/2009   Primary empty sella syndrome ([place]) 2015   Noted on imaging during hospitalization for herpes meningitis; no pituitary mass, no hormone w/u at that time, hormonally asymptomatic   Type 2 diabetes mellitus with neurological complications ([place]) [id number]   Review of Systems:   Review of Systems  Constitutional:  Negative for chills, fever and malaise/fatigue.  Respiratory:  Negative for cough and shortness of breath.   Cardiovascular:  Negative for chest pain and leg swelling.  Gastrointestinal:  Negative for abdominal pain, nausea and vomiting.  Musculoskeletal:  Positive for joint pain. Negative for falls.  Neurological:  Negative for dizziness, weakness and headaches.    Physical Exam:  Vitals:   05/31/21 1435  BP: (!) 150/67  Pulse: 88  Temp: 98.1 F (36.7 C)  TempSrc: Oral  SpO2: 97%  Weight: 195 lb (88.5 kg)  Height: '5\' 3"'$  (1.6 m)   Physical Exam General: alert, appears stated age, in no acute distress HEENT: Normocephalic, atraumatic, EOM intact, conjunctiva normal CV: Regular rate and rhythm, no murmurs rubs or gallops Pulm: Clear to auscultation bilaterally, normal work of breathing Abdomen: Soft, nondistended,  bowel sounds present, no tenderness to palpation MSK: No lower extremity edema Skin: Warm and dry Neuro: Alert and oriented x3   Assessment & Plan:   See Encounters Tab for problem based charting.  Patient discussed with Dr. Daryll Drown

## 2021-05-31 NOTE — Telephone Encounter (Signed)
Victoza and pen needles picked up by pt today.

## 2021-05-31 NOTE — Assessment & Plan Note (Signed)
Placed referral for colonoscopy and DEXA scan.

## 2021-05-31 NOTE — Assessment & Plan Note (Signed)
Hemoglobin A1c 6.8 today improved from 8 back in January.  States that she has been working on her diet and increasing the amount of walking she is doing.  She has been consistent on her metformin and Victoza.  Recommend continuing current medication regimen and rechecking hemoglobin A1c in 3 months.

## 2021-06-01 LAB — LIPID PANEL
Chol/HDL Ratio: 2.6 ratio (ref 0.0–4.4)
Cholesterol, Total: 171 mg/dL (ref 100–199)
HDL: 65 mg/dL (ref >39)
LDL Chol Calc (NIH): 79 mg/dL (ref 0–99)
Triglycerides: 161 mg/dL — ABNORMAL HIGH (ref 0–149)
VLDL Cholesterol Cal: 27 mg/dL (ref 5–40)

## 2021-06-01 LAB — CBC WITH DIFFERENTIAL/PLATELET
Basophils Absolute: 0 10*3/uL (ref 0.0–0.2)
Basos: 0 %
EOS (ABSOLUTE): 0.3 10*3/uL (ref 0.0–0.4)
Eos: 2 %
Hematocrit: 34.5 % (ref 34.0–46.6)
Hemoglobin: 10.7 g/dL — ABNORMAL LOW (ref 11.1–15.9)
Immature Grans (Abs): 0 10*3/uL (ref 0.0–0.1)
Immature Granulocytes: 0 %
Lymphocytes Absolute: 2.8 10*3/uL (ref 0.7–3.1)
Lymphs: 24 %
MCH: 24.8 pg — ABNORMAL LOW (ref 26.6–33.0)
MCHC: 31 g/dL — ABNORMAL LOW (ref 31.5–35.7)
MCV: 80 fL (ref 79–97)
Monocytes Absolute: 0.8 10*3/uL (ref 0.1–0.9)
Monocytes: 7 %
Neutrophils Absolute: 7.6 10*3/uL — ABNORMAL HIGH (ref 1.4–7.0)
Neutrophils: 67 %
Platelets: 410 10*3/uL (ref 150–450)
RBC: 4.32 x10E6/uL (ref 3.77–5.28)
RDW: 15.4 % (ref 11.7–15.4)
WBC: 11.6 10*3/uL — ABNORMAL HIGH (ref 3.4–10.8)

## 2021-06-01 LAB — IRON,TIBC AND FERRITIN PANEL
Ferritin: 69 ng/mL (ref 15–150)
Iron Saturation: 10 % — ABNORMAL LOW (ref 15–55)
Iron: 38 ug/dL (ref 27–139)
Total Iron Binding Capacity: 362 ug/dL (ref 250–450)
UIBC: 324 ug/dL (ref 118–369)

## 2021-06-01 LAB — TSH: TSH: 3.95 u[IU]/mL (ref 0.450–4.500)

## 2021-06-07 ENCOUNTER — Ambulatory Visit: Payer: Medicare HMO

## 2021-06-08 NOTE — Patient Instructions (Signed)
Visit Information   Goals Addressed             This Visit's Progress    Begin and Stick with Counseling-Depression       Timeframe:  Long-Range Goal Priority:  High Start Date:          11/16/20                   Expected End Date:   ongoing                    Follow Up Date 930/22   - check out counseling - keep 90 percent of counseling appointments - schedule counseling appointment    Why is this important?   Beating depression may take some time.  If you don't feel better right away, don't give up on your treatment plan.    Notes: Patient misses last counseling appointment, she wants to reschedule     Blood Pressure < 140/90       BP Readings from Last 3 Encounters:  05/31/21 (!) 141/65  11/09/20 (!) 144/68  10/17/20 (!) 170/62        LDL CALC < 100       Lab Results  Component Value Date   CHOL 171 05/31/2021   HDL 65 05/31/2021   LDLCALC 79 05/31/2021   TRIG 161 (H) 05/31/2021   CHOLHDL 2.6 05/31/2021        Make and Keep All Appointments       Timeframe:  Long-Range Goal Priority:  High Start Date:           11/16/20                  Expected End Date:      ongoing                 Follow Up Date 07/27/21   - call to cancel if needed - keep a calendar with appointment dates    Why is this important?   Part of staying healthy is seeing the doctor for follow-up care.  If you forget your appointments, there are some things you can do to stay on track.    Notes:      Track and Manage My Blood Pressure-Hypertension       Timeframe:  Long-Range Goal Priority:  High Start Date:       11/16/20                      Expected End Date:  ongoing                     Follow Up Date 07/27/21   - check blood pressure weekly - write blood pressure results in a log or diary    Why is this important?   You won't feel high blood pressure, but it can still hurt your blood vessels.  High blood pressure can cause heart or kidney problems. It can also cause a  stroke.  Making lifestyle changes like losing a little weight or eating less salt will help.  Checking your blood pressure at home and at different times of the day can help to control blood pressure.  If the doctor prescribes medicine remember to take it the way the doctor ordered.  Call the office if you cannot afford the medicine or if there are questions about it.     Notes:  The patient verbalized understanding of instructions, educational materials, and care plan provided today and declined offer to receive copy of patient instructions, educational materials, and care plan.   Telephone follow up appointment with care management team member scheduled for: 07/05/21'@3'$ :[place], RN, BSN, CCM Care Management Coordinator [facility] Phone: [phone] / Fax: [phone]

## 2021-06-08 NOTE — Chronic Care Management (AMB) (Signed)
Care Management    RN Visit Note  06/08/2021 Name: Sherri Gill MRN: [id number] DOB: 03/27/1951  Subjective: Sherri Gill is a 70 y.o. year old female who is a primary care patient of Sherri Pou, MD. The care management team was consulted for assistance with disease management and care coordination needs.    Engaged with patient by telephone for follow up visit in response to provider referral for case management and/or care coordination services.   Consent to Services:   Sherri Gill was given information about Care Management services today including:  Care Management services includes personalized support from designated clinical staff supervised by her physician, including individualized plan of care and coordination with other care providers 24/7 contact phone numbers for assistance for urgent and routine care needs. The patient may stop case management services at any time by phone call to the office staff.  Patient agreed to services and consent obtained.   Assessment: Review of patient past medical history, allergies, medications, health status, including review of consultants reports, laboratory and other test data, was performed as part of comprehensive evaluation and provision of chronic care management services.   SDOH (Social Determinants of Health) assessments and interventions performed:    Care Plan  Allergies  Allergen Reactions   Codeine Sulfate Nausea And Vomiting and Other (See Comments)    Dizziness   Penicillins Nausea And Vomiting   Percocet [Oxycodone-Acetaminophen] Nausea And Vomiting   Codeine Diarrhea   Lisinopril Cough    Outpatient Encounter Medications as of 06/07/2021  Medication Sig   albuterol (PROVENTIL HFA;VENTOLIN HFA) 108 (90 Base) MCG/ACT inhaler Inhale 2 puffs every 6 (six) hours as needed into the lungs for wheezing or shortness of breath.   amLODipine (NORVASC) 10 MG tablet Take 1 tablet (10 mg total) by mouth daily.    atorvastatin (LIPITOR) 20 MG tablet Take 1 tablet (20 mg total) by mouth daily.   Blood Glucose Monitoring Suppl (ONE TOUCH ULTRA MINI) w/Device KIT Please use as directed.   buPROPion (WELLBUTRIN XL) 150 MG 24 hr tablet Take 1 tablet (150 mg total) by mouth daily.   Cholecalciferol 1000 units capsule Take 1 capsule (1,000 Units total) by mouth daily.   ferrous gluconate (FERGON) 324 MG tablet Take 1 tablet (324 mg total) by mouth daily.   ferrous sulfate 325 (65 FE) MG tablet Take by mouth.   fluticasone (FLONASE) 50 MCG/ACT nasal spray Place 2 sprays into both nostrils daily.   glucose blood (ONE TOUCH ULTRA TEST) test strip Use as instructed   hydrochlorothiazide (HYDRODIURIL) 25 MG tablet TAKE 1 TABLET DAILY WITH LOSARTAN   Insulin Pen Needle 32G X 4 MM MISC 1 each by Does not apply route daily.   liraglutide (VICTOZA) 18 MG/3ML SOPN PLEASE START TAKING VICTOZA 0.6MG DAILY WEEK 1, THEN TAKE 1.2MG DAILY WEEK 2, AND THEN TAKE 1.8MG DAILY THEREAFTER   losartan (COZAAR) 100 MG tablet Take 1 tablet (100 mg total) by mouth daily.   metFORMIN (GLUCOPHAGE) 1000 MG tablet Take 1 tablet (1,000 mg total) by mouth 2 (two) times daily with a meal.   ONETOUCH DELICA LANCETS 38V MISC Please use as directed.   No facility-administered encounter medications on file as of 06/07/2021.    Patient Active Problem List   Diagnosis Date Noted   Elevated TSH 05/31/2021   Radicular pain in left arm 11/10/2020   Routine health maintenance 11/10/2020   Personal history of colonic polyps 11/10/2020   Elevated alkaline phosphatase level  03/17/2016   Carpal tunnel syndrome 02/11/2014   Iron deficiency anemia 08/29/2012   Obesity, BMI 35-40 06/11/2012   Dyslipidemia associated with type 2 diabetes mellitus ([place]) 06/30/2007   Major depressive disorder with single episode, in remission ([place]) 06/30/2007   Essential hypertension 06/30/2007   GERD 06/30/2007   Type 2 diabetes mellitus with neurological complications  ([place]) 24/26/8341    Conditions to be addressed/monitored: HTN and DMII  Care Plan : CCM RN- Diabetes Type 2 (Adult), HTN, Depression/Anxiety  Updates made by [name], RN since 06/08/2021 12:00 AM     Problem: Glycemic Management (Diabetes, Type 2), HTN, Depression/Anxiety   Priority: High     Goal: Glycemic, HTN, and anxiety/depression Management Optimized   Start Date: 04/19/2020  Recent Progress: On track  Priority: High  Note:   CARE PLAN ENTRY (see longitudinal plan of care for additional care plan information)  Objective:    Lab Results  Component Value Date   HGBA1C 6.8 (A) 05/31/2021   HGBA1C 8.0 (A) 11/09/2020   HGBA1C 7.1 (A) 03/28/2020   Lab Results  Component Value Date   MICROALBUR 1.4 08/03/2014   LDLCALC 79 05/31/2021   CREATININE 0.82 11/09/2020       Current Barriers:  Knowledge Deficits related to self management of chronic disease states- Successful outreach to patient, she notes her stress level has improved significantly since she started to walk every morning with her son.  Patient had a recent office visit with improvement noted with her A1C down to 6.8 from 8.0 in 11/09/20. Wished patient a happy belated birthday ([birth year])and she said she had a good day.  Patient expressed the issues she is having with her son and his mental health issues.  This RNCM offered support and listened to her express her feelings.  Patient noted she missed her last appointment with Dr. Theodis Shove for counseling and she would like to get another appointment with her on a Tuesday or Thursday, her day off. Case Manager Clinical Goal(s):  Over the next 30-60 days, patient will demonstrate improved adherence to prescribed treatment plan for diabetes self care/management as evidenced by:  daily monitoring and recording of CBG  adherence to ADA/ carb modified diet exercise at least 5 days/week adherence to prescribed medication regimen  Interventions:  Discussed plans with  patient for ongoing care management follow up and provided patient with direct contact information for care management team Assessed patient's self management skills in regards to HTN, NIDDM, HLD, and anxiety/depression Evaluation of current treatment plan related to HTN, NIDDM, HLD, and depression/anxiety and patient's adherence to plan as established by provider. Reviewed medications with patient and assessed medication taking behavior;  if indicated, discussed importance of medication adherence  Provided opportunity for patient to share the stress she is feeling and congratulated patient on exercising and improving her overall well being. Patient Self Care Activities:  UNABLE to independently get Hgb A1C under 7.0%-Patient meeting goal Unable to independently secure appointment with clinic behavioral health counselor  - keep all provider appointments  call to cancel if needed - keep a calendar with appointment dates - check blood sugar at prescribed times - check blood sugar if I feel it is too high or too low - take the blood sugar meter to all doctor visits - check blood pressure weekly - check out counseling - keep 90 percent of counseling appointments - schedule counseling appointment      Plan: Telephone follow up appointment with care management team member scheduled for:  89 days  [name], RN, BSN, CCM Care Management Coordinator [facility] Internal Medicine Phone: [phone] / Fax: [phone]

## 2021-06-16 NOTE — Progress Notes (Signed)
Internal Medicine Clinic Attending ° °Case discussed with Dr. [name]  At the time of the visit.  We reviewed the resident’s history and exam and pertinent patient test results.  I agree with the assessment, diagnosis, and plan of care documented in the resident’s note.  ° °

## 2021-06-21 ENCOUNTER — Other Ambulatory Visit: Payer: Self-pay | Admitting: Internal Medicine

## 2021-06-26 ENCOUNTER — Telehealth: Payer: Self-pay

## 2021-06-26 NOTE — Telephone Encounter (Signed)
Spoke the patient regarding victoza. PAP medication is ready for pickup.  Med is labeled and ready in fridge (pen needles on counter).

## 2021-07-05 ENCOUNTER — Telehealth: Payer: Self-pay

## 2021-07-05 ENCOUNTER — Telehealth: Payer: Medicare HMO

## 2021-07-05 NOTE — Telephone Encounter (Signed)
  Chronic Care Management   Note  07/05/2021 Name: Sherri Gill MRN: [id number] DOB: 08-07-1951   Follow up plan: Sucessful outreach to patient this afternoon.  Patient was in grocery store and requested call back for tomorrow.  Rescheduled appointment for 07/06/21'@3PM'$   [name], RN, BSN, CCM Care Management Coordinator [facility] Internal Medicine Phone: [phone] / Fax: [phone]

## 2021-07-06 ENCOUNTER — Telehealth: Payer: Self-pay

## 2021-07-06 ENCOUNTER — Telehealth: Payer: Medicare HMO

## 2021-07-06 NOTE — Telephone Encounter (Signed)
  Care Management   Outreach Note  07/06/2021 Name: SHALYSE KEARLEY MRN: [id number] DOB: Mar 20, 1951  Referred by: [name], MD Reason for referral : No chief complaint on file.   An unsuccessful telephone outreach was attempted today. The patient was referred to the case management team for assistance with care management and care coordination.   Follow Up Plan: If patient returns call to provider office, please advise to call [place] at [phone].  [name], RN, BSN, CCM Care Management Coordinator [facility] Internal Medicine Phone: [phone] / Fax: [phone]

## 2021-07-12 ENCOUNTER — Telehealth: Payer: Self-pay | Admitting: Behavioral Health

## 2021-07-12 ENCOUNTER — Ambulatory Visit: Payer: Medicare HMO | Admitting: Behavioral Health

## 2021-07-12 NOTE — Telephone Encounter (Signed)
Contacted Pt for today's appt on telehealth @ 3:00pm. Unable to lv msg as VM is not set-up. Pt has already been r/s'd several times & will need to contact [facility] for appt.  Dr. Theodis Shove

## 2021-08-20 ENCOUNTER — Telehealth: Payer: Self-pay | Admitting: Behavioral Health

## 2021-08-20 NOTE — Telephone Encounter (Signed)
Contacted Pt per her call request to schedule. Scheduled for Mon, Oct 31st @ 3:00pm. Reminded Pt she has not picked up our last few calls. Pt acknowledged.  Dr. Theodis Shove

## 2021-08-21 ENCOUNTER — Ambulatory Visit: Payer: Medicare HMO

## 2021-08-22 NOTE — Patient Instructions (Signed)
Visit Information   Goals Addressed             This Visit's Progress    Begin and Stick with Counseling-Depression       Timeframe:  Long-Range Goal Priority:  High Start Date:          11/16/20                   Expected End Date:   ongoing                    Follow Up Date 09/26/21   - check out counseling - keep 90 percent of counseling appointments - schedule counseling appointment    Why is this important?   Beating depression may take some time.  If you don't feel better right away, don't give up on your treatment plan.    Notes:      Blood Pressure < 140/90       BP Readings from Last 3 Encounters:  05/31/21 (!) 141/65  11/09/20 (!) 144/68  10/17/20 (!) 170/62        HEMOGLOBIN A1C < 7       Lab Results  Component Value Date   HGBA1C 6.8 (A) 05/31/2021   HGBA1C 8.0 (A) 11/09/2020   HGBA1C 7.1 (A) 03/28/2020   Lab Results  Component Value Date   MICROALBUR 1.4 08/03/2014   LDLCALC 79 05/31/2021   CREATININE 0.82 11/09/2020        LDL CALC < 100       Lab Results  Component Value Date   CHOL 171 05/31/2021   HDL 65 05/31/2021   LDLCALC 79 05/31/2021   TRIG 161 (H) 05/31/2021   CHOLHDL 2.6 05/31/2021        Make and Keep All Appointments       Timeframe:  Long-Range Goal Priority:  High Start Date:           11/16/20                  Expected End Date:      ongoing                 Follow Up Date 09/26/21   - call to cancel if needed - keep a calendar with appointment dates    Why is this important?   Part of staying healthy is seeing the doctor for follow-up care.  If you forget your appointments, there are some things you can do to stay on track.    Notes:      Monitor and Manage My Blood Sugar-Diabetes Type 2       Timeframe:  Long-Range Goal Priority:  High Start Date:         11/16/20                   Expected End Date:       ongoing                Follow Up Date 09/26/21   - check blood sugar at prescribed times - check blood  sugar if I feel it is too high or too low - take the blood sugar meter to all doctor visits    Why is this important?   Checking your blood sugar at home helps to keep it from getting very high or very low.  Writing the results in a diary or log helps the doctor know how to care for you.  Your blood sugar log should have the time, date and the results.  Also, write down the amount of insulin or other medicine that you take.  Other information, like what you ate, exercise done and how you were feeling, will also be helpful.     Notes:      Track and Manage My Blood Pressure-Hypertension       Timeframe:  Long-Range Goal Priority:  High Start Date:       11/16/20                      Expected End Date:  ongoing                     Follow Up Date 09/26/21   - check blood pressure weekly - write blood pressure results in a log or diary    Why is this important?   You won't feel high blood pressure, but it can still hurt your blood vessels.  High blood pressure can cause heart or kidney problems. It can also cause a stroke.  Making lifestyle changes like losing a little weight or eating less salt will help.  Checking your blood pressure at home and at different times of the day can help to control blood pressure.  If the doctor prescribes medicine remember to take it the way the doctor ordered.  Call the office if you cannot afford the medicine or if there are questions about it.     Notes:         The patient verbalized understanding of instructions, educational materials, and care plan provided today and declined offer to receive copy of patient instructions, educational materials, and care plan.   Telephone follow up appointment with care management team member scheduled for: 09/06/21@3PM   [name], RN, BSN, CCM Care Management Coordinator [facility] Internal Medicine Phone: [phone] / Fax: [phone]

## 2021-08-22 NOTE — Chronic Care Management (AMB) (Signed)
Care Management    RN Visit Note  08/22/2021 Name: Sherri Gill MRN: [id number] DOB: [birth year]  Subjective: Sherri Gill is a 70 y.o. year old female who is a primary care patient of Sherri Pou, MD. The care management team was consulted for assistance with disease management and care coordination needs.    Engaged with patient by telephone for follow up visit in response to provider referral for case management and/or care coordination services.   Consent to Services:   [name] was given information about Care Management services today including:  Care Management services includes personalized support from designated clinical staff supervised by her physician, including individualized plan of care and coordination with other care providers 24/7 contact phone numbers for assistance for urgent and routine care needs. The patient may stop case management services at any time by phone call to the office staff.  Patient agreed to services and consent obtained.   Assessment: Review of patient past medical history, allergies, medications, health status, including review of consultants reports, laboratory and other test data, was performed as part of comprehensive evaluation and provision of chronic care management services.   SDOH (Social Determinants of Health) assessments and interventions performed:    Care Plan  Allergies  Allergen Reactions   Codeine Sulfate Nausea And Vomiting and Other (See Comments)    Dizziness   Penicillins Nausea And Vomiting   Percocet [Oxycodone-Acetaminophen] Nausea And Vomiting   Codeine Diarrhea   Lisinopril Cough    Outpatient Encounter Medications as of 08/21/2021  Medication Sig   albuterol (PROVENTIL HFA;VENTOLIN HFA) 108 (90 Base) MCG/ACT inhaler Inhale 2 puffs every 6 (six) hours as needed into the lungs for wheezing or shortness of breath.   amLODipine (NORVASC) 10 MG tablet Take 1 tablet (10 mg total) by mouth daily.    atorvastatin (LIPITOR) 20 MG tablet Take 1 tablet (20 mg total) by mouth daily.   Blood Glucose Monitoring Suppl (ONE TOUCH ULTRA MINI) w/Device KIT Please use as directed.   buPROPion (WELLBUTRIN XL) 150 MG 24 hr tablet Take 1 tablet (150 mg total) by mouth daily.   Cholecalciferol 1000 units capsule Take 1 capsule (1,000 Units total) by mouth daily.   ferrous gluconate (FERGON) 324 MG tablet Take 1 tablet (324 mg total) by mouth daily.   ferrous sulfate 325 (65 FE) MG tablet Take by mouth.   fluticasone (FLONASE) 50 MCG/ACT nasal spray Place 2 sprays into both nostrils daily.   glucose blood (ONE TOUCH ULTRA TEST) test strip Use as instructed   hydrochlorothiazide (HYDRODIURIL) 25 MG tablet TAKE 1 TABLET DAILY WITH LOSARTAN   Insulin Pen Needle 32G X 4 MM MISC 1 each by Does not apply route daily.   liraglutide (VICTOZA) 18 MG/3ML SOPN PLEASE START TAKING VICTOZA 0.6MG DAILY WEEK 1, THEN TAKE 1.2MG DAILY WEEK 2, AND THEN TAKE 1.8MG DAILY THEREAFTER   losartan (COZAAR) 100 MG tablet Take 1 tablet (100 mg total) by mouth daily.   metFORMIN (GLUCOPHAGE) 1000 MG tablet Take 1 tablet (1,000 mg total) by mouth 2 (two) times daily with a meal.   ONETOUCH DELICA LANCETS 45G MISC Please use as directed.   No facility-administered encounter medications on file as of 08/21/2021.    Patient Active Problem List   Diagnosis Date Noted   Elevated TSH 05/31/2021   Radicular pain in left arm 11/10/2020   Routine health maintenance 11/10/2020   Personal history of colonic polyps 11/10/2020   Elevated alkaline phosphatase level  03/17/2016   Carpal tunnel syndrome 02/11/2014   Iron deficiency anemia 08/29/2012   Obesity, BMI 35-40 06/11/2012   Dyslipidemia associated with type 2 diabetes mellitus ([place]) 06/30/2007   Major depressive disorder with single episode, in remission ([place]) 06/30/2007   Essential hypertension 06/30/2007   GERD 06/30/2007   Type 2 diabetes mellitus with neurological complications  ([place]) 16/07/9603    Conditions to be addressed/monitored: HTN and DMII  Care Plan : CCM RN- Diabetes Type 2 (Adult), HTN, Depression/Anxiety  Updates made by [name], RN since 08/22/2021 12:00 AM     Problem: Glycemic Management (Diabetes, Type 2), HTN, Depression/Anxiety   Priority: High     Goal: Glycemic, HTN, and anxiety/depression Management Optimized   Start Date: 04/19/2020  Recent Progress: On track  Priority: High  Note:   CARE PLAN ENTRY (see longitudinal plan of care for additional care plan information)  Objective:    Lab Results  Component Value Date   HGBA1C 6.8 (A) 05/31/2021   HGBA1C 8.0 (A) 11/09/2020   HGBA1C 7.1 (A) 03/28/2020   Lab Results  Component Value Date   MICROALBUR 1.4 08/03/2014   LDLCALC 79 05/31/2021   CREATININE 0.82 11/09/2020       Current Barriers:  Knowledge Deficits related to self management of chronic disease states- Successful outreach to patient,  she notes that her son seems to be doing better and she has taken some steps to get him to be more proactive in his personal care and assist her with general apartment chores.  Allowed patient to talk about issues with her son and dealing with his medications and refills. Patient continues to ambulate every day and she feels very good.  Patient noted that she needs a new battery for her blood pressure monitor so she has not taken it in the past couple weeks. Case Manager Clinical Goal(s):  Over the next 30-60 days, patient will demonstrate improved adherence to prescribed treatment plan for diabetes self care/management as evidenced by:  daily monitoring and recording of CBG  adherence to ADA/ carb modified diet exercise at least 5 days/week adherence to prescribed medication regimen  Interventions:  Discussed plans with patient for ongoing care management follow up and provided patient with direct contact information for care management team Assessed patient's self management  skills in regards to HTN, NIDDM, HLD, and anxiety/depression Evaluation of current treatment plan related to HTN, NIDDM, HLD, and depression/anxiety and patient's adherence to plan as established by provider. Reviewed medications with patient and assessed medication taking behavior;  if indicated, discussed importance of medication adherence  Provided opportunity for patient to share the stress she is feeling and congratulated patient on exercising and improving her overall well being. Patient Self Care Activities:  UNABLE to independently get Hgb A1C under 7.0%-Patient meeting goal Unable to independently secure appointment with clinic behavioral health counselor  - keep all provider appointments  call to cancel if needed - keep a calendar with appointment dates - check blood sugar at prescribed times - check blood sugar if I feel it is too high or too low - take the blood sugar meter to all doctor visits - check blood pressure weekly - check out counseling - keep 90 percent of counseling appointments - schedule counseling appointment      Plan: Telephone follow up appointment with care management team member scheduled for:  30 days  [name], RN, BSN, CCM Care Management Coordinator [facility] Internal Medicine Phone: [phone] / Fax: [phone]

## 2021-08-23 ENCOUNTER — Ambulatory Visit: Payer: Medicare HMO

## 2021-08-23 ENCOUNTER — Ambulatory Visit (INDEPENDENT_AMBULATORY_CARE_PROVIDER_SITE_OTHER): Payer: Medicare HMO | Admitting: Internal Medicine

## 2021-08-23 VITALS — BP 160/72 | HR 75 | Temp 98.2°F | Ht 63.0 in | Wt 202.9 lb

## 2021-08-23 DIAGNOSIS — E1149 Type 2 diabetes mellitus with other diabetic neurological complication: Secondary | ICD-10-CM | POA: Diagnosis not present

## 2021-08-23 DIAGNOSIS — I10 Essential (primary) hypertension: Secondary | ICD-10-CM

## 2021-08-23 DIAGNOSIS — Z0000 Encounter for general adult medical examination without abnormal findings: Secondary | ICD-10-CM | POA: Diagnosis not present

## 2021-08-23 DIAGNOSIS — E669 Obesity, unspecified: Secondary | ICD-10-CM | POA: Diagnosis not present

## 2021-08-23 DIAGNOSIS — M792 Neuralgia and neuritis, unspecified: Secondary | ICD-10-CM | POA: Diagnosis not present

## 2021-08-23 NOTE — Telephone Encounter (Signed)
Pt arrived to pickup medication.  Aware she is due for re enrollment for 2023 (with novo nordisk)

## 2021-08-23 NOTE — Patient Instructions (Addendum)
Great to catch up with you, Sherri Gill!   Dr. Theodis Shove Monday 08/27/21 3 pm  Come to clinic within next 3-4 months for blood work and for a pneumonia shot.  I have referred you to sports medicine clinic for evaluation of your L shoulder/arm/neck.  When I call you with your test results later in the month, we'll schedule your next appt with me.  Let me check on the status of your colonoscopy and I'll get back to you.    Take care and stay well!  Dr. [name]

## 2021-08-23 NOTE — Progress Notes (Addendum)
Sherri Gill is here for routine follow-up-it has been quite sometime since I have seen her.  She continues to experience stress with the care of her son.  Was distraught today to see that her weight increased and she endorses emotional eating, but has been walking for exercise several times a week.  Left shoulder continues to bother her with dysesthesias down the left upper extremity.  She had reported this in the past, at which time it was exacerbated by sleeping position.  It continues to bother her.  She reports some left lower quadrant discomfort when voiding, though no dysuria or uncomfortable BMs.  This has been more of a nagging sensation and she thought she would bring it up.  She also wishes evaluation of a nontender bump in the perineum.    Patient Active Problem List   Diagnosis Date Noted   Elevated TSH 05/31/2021   Radicular pain in left arm 11/10/2020   Routine health maintenance 11/10/2020   Personal history of colonic polyps 11/10/2020   Elevated alkaline phosphatase level 03/17/2016   Carpal tunnel syndrome 02/11/2014   Iron deficiency anemia 08/29/2012   Obesity, BMI 35-40 06/11/2012   Dyslipidemia associated with type 2 diabetes mellitus ([place]) 06/30/2007   Major depressive disorder with single episode, in remission ([place]) 06/30/2007   Essential hypertension 06/30/2007   GERD 06/30/2007   Type 2 diabetes mellitus with neurological complications ([place]) 76/16/0737    Current Outpatient Medications:    amLODipine (NORVASC) 10 MG tablet, Take 1 tablet (10 mg total) by mouth daily., Disp: 90 tablet, Rfl: 2   atorvastatin (LIPITOR) 20 MG tablet, Take 1 tablet (20 mg total) by mouth daily., Disp: 90 tablet, Rfl: 3   buPROPion (WELLBUTRIN XL) 150 MG 24 hr tablet, Take 1 tablet (150 mg total) by mouth daily., Disp: 90 tablet, Rfl: 3   hydrochlorothiazide (HYDRODIURIL) 25 MG tablet, TAKE 1 TABLET DAILY WITH LOSARTAN, Disp: 90 tablet, Rfl: 3   losartan (COZAAR) 100 MG tablet, Take 1  tablet (100 mg total) by mouth daily., Disp: 90 tablet, Rfl: 3   metFORMIN (GLUCOPHAGE) 1000 MG tablet, Take 1 tablet (1,000 mg total) by mouth 2 (two) times daily with a meal., Disp: 180 tablet, Rfl: 3   albuterol (PROVENTIL HFA;VENTOLIN HFA) 108 (90 Base) MCG/ACT inhaler, Inhale 2 puffs every 6 (six) hours as needed into the lungs for wheezing or shortness of breath., Disp: 1 Inhaler, Rfl: 0   Blood Glucose Monitoring Suppl (ONE TOUCH ULTRA MINI) w/Device KIT, Please use as directed., Disp: 1 each, Rfl: 0   Cholecalciferol 1000 units capsule, Take 1 capsule (1,000 Units total) by mouth daily., Disp: 90 capsule, Rfl: 1   ferrous sulfate 325 (65 FE) MG tablet, Take by mouth., Disp: , Rfl:    fluticasone (FLONASE) 50 MCG/ACT nasal spray, Place 2 sprays into both nostrils daily., Disp: 1 g, Rfl: 2   glucose blood (ONE TOUCH ULTRA TEST) test strip, Use as instructed, Disp: 100 each, Rfl: 1   Insulin Pen Needle 32G X 4 MM MISC, 1 each by Does not apply route daily., Disp: 100 each, Rfl: 3   liraglutide (VICTOZA) 18 MG/3ML SOPN, PLEASE START TAKING VICTOZA 0.6MG DAILY WEEK 1, THEN TAKE 1.2MG DAILY WEEK 2, AND THEN TAKE 1.8MG DAILY THEREAFTER, Disp: 9 mL, Rfl: 3   ONETOUCH DELICA LANCETS 10G MISC, Please use as directed., Disp: 100 each, Rfl: 0   BP (!) 160/72   Pulse 75   Temp 98.2 F (36.8 C)  Ht 5' 3" (1.6 m)   Wt 202 lb 14.4 oz (92 kg)   SpO2 100%   BMI 35.94 kg/m  Sherri Gill was in good spirits with no anxiety apparent.  Left shoulder with full range of motion, neck with full range of motion.  Negative Spurling's test.  Negative empty can sign.  No tenderness about the joint or over the bicipital tendon.  Abdomen soft nontender.  No reproducible tenderness in the left lower quadrant or in the pelvis.  No abnormality palpated along the left inguinal ligament and no bulge palpated during Valsalva.  Perineum with a small 0.5 mm nontender superficial papule just beneath the left labia.  Assessment  and plan (see also problem based documentation):  Sherri Gill is doing reasonably well.  She will be turning her attention to her dietary patterns with certain change due to stress.  She was reassured that the perineal bump probably represents a resolving follicular abscess which should not cause any problems.  She was advised to return to care if this should enlarge or become more tender at which time drainage may be indicated.  We reviewed her prevention and health maintenance measures.  Undergone a mammogram which is ordered.  Overdue for colonoscopy.  She received the flu shot today and wishes to delay blood work and pneumonia vaccination until a later date-she will return in about 3 to 4 weeks (orders have been placed for her to return when convenient) I will review her colonoscopy schedule.  Will refer to sports medicine for further left shoulder and neck evaluation.  Reminded Sherri Gill that she has an appointment with Dr. Theodis Shove on Monday 10/31 at 3 PM.  When I receive her blood work results in about a month, I will call her with results, and at that time advise her on the interval for her next appointment.  Referred to ophth Dr. Katy Fitch in 10/2020 - did this happen? Referred to [facility] most recently 05/2021 - appears that the referral may have been closed as they were unable to contact her. *Important that she has f/u - iron deficiency, hx of colon polyp.

## 2021-08-27 ENCOUNTER — Ambulatory Visit: Payer: Medicare HMO | Admitting: Behavioral Health

## 2021-09-03 NOTE — Assessment & Plan Note (Signed)
Symptoms persist for which we will refer to sports medicine for further evaluation.  Negative Spurling's test today.

## 2021-09-03 NOTE — Assessment & Plan Note (Signed)
SBP uncontrolled on max 3 drug therapy.  She was very distraught today about her weight gain, although she has been trending above goal regardless.  I will obtain results from her upcoming BMP prior to advising about next treatment plan.

## 2021-09-03 NOTE — Assessment & Plan Note (Signed)
Weights continue around the 200 pounds/BMI 35 range, though she has gained a few pounds since August which upsets her very much.  She attributes this to emotional eating and will be conscious of a more healthy diet.

## 2021-09-03 NOTE — Assessment & Plan Note (Signed)
Will return for PCV 13

## 2021-09-06 ENCOUNTER — Telehealth: Payer: Self-pay

## 2021-09-06 ENCOUNTER — Ambulatory Visit: Payer: Medicare HMO

## 2021-09-06 ENCOUNTER — Ambulatory Visit (INDEPENDENT_AMBULATORY_CARE_PROVIDER_SITE_OTHER): Payer: Medicare HMO | Admitting: Student

## 2021-09-06 VITALS — BP 149/61 | HR 89 | Temp 98.8°F

## 2021-09-06 DIAGNOSIS — I10 Essential (primary) hypertension: Secondary | ICD-10-CM

## 2021-09-06 DIAGNOSIS — E1149 Type 2 diabetes mellitus with other diabetic neurological complication: Secondary | ICD-10-CM

## 2021-09-06 DIAGNOSIS — Z23 Encounter for immunization: Secondary | ICD-10-CM

## 2021-09-06 DIAGNOSIS — E1169 Type 2 diabetes mellitus with other specified complication: Secondary | ICD-10-CM

## 2021-09-06 DIAGNOSIS — R350 Frequency of micturition: Secondary | ICD-10-CM | POA: Diagnosis not present

## 2021-09-06 DIAGNOSIS — E785 Hyperlipidemia, unspecified: Secondary | ICD-10-CM

## 2021-09-06 LAB — POCT URINALYSIS DIPSTICK
Bilirubin, UA: NEGATIVE
Blood, UA: NEGATIVE
Glucose, UA: NEGATIVE
Ketones, UA: NEGATIVE
Leukocytes, UA: NEGATIVE
Nitrite, UA: NEGATIVE
Protein, UA: NEGATIVE
Spec Grav, UA: 1.02 (ref 1.010–1.025)
Urobilinogen, UA: 0.2 E.U./dL
pH, UA: 5.5 (ref 5.0–8.0)

## 2021-09-06 LAB — POCT GLYCOSYLATED HEMOGLOBIN (HGB A1C): Hemoglobin A1C: 7.2 % — AB (ref 4.0–5.6)

## 2021-09-06 LAB — GLUCOSE, CAPILLARY: Glucose-Capillary: 118 mg/dL — ABNORMAL HIGH (ref 70–99)

## 2021-09-06 MED ORDER — CHLORTHALIDONE 25 MG PO TABS: 25.0000 mg | ORAL_TABLET | Freq: Every day | ORAL | 2 refills | Status: DC

## 2021-09-06 NOTE — Patient Instructions (Signed)
Visit Information  The patient verbalized understanding of instructions, educational materials, and care plan provided today and declined offer to receive copy of patient instructions, educational materials, and care plan.   Telephone follow up appointment with care management team member scheduled for:09/10/21@12 :30  [name], RN, BSN, [facility] Care Management Coordinator [facility] Phone: [phone]: [phone]

## 2021-09-06 NOTE — Chronic Care Management (AMB) (Signed)
Care Management    RN Visit Note  09/06/2021 Name: Sherri Gill MRN: [id number] DOB: 05/03/1951  Subjective: Sherri Gill is a 70 y.o. year old female who is a primary care patient of Sherri Pou, MD. The care management team was consulted for assistance with disease management and care coordination needs.    Engaged with patient by telephone for follow up visit in response to provider referral for case management and/or care coordination services.   Consent to Services:   Sherri Gill was given information about Care Management services today including:  Care Management services includes personalized support from designated clinical staff supervised by her physician, including individualized plan of care and coordination with other care providers 24/7 contact phone numbers for assistance for urgent and routine care needs. The patient may stop case management services at any time by phone call to the office staff.  Patient agreed to services and consent obtained.   Assessment: Review of patient past medical history, allergies, medications, health status, including review of consultants reports, laboratory and other test data, was performed as part of comprehensive evaluation and provision of chronic care management services.   SDOH (Social Determinants of Health) assessments and interventions performed:    Care Plan  Allergies  Allergen Reactions   Codeine Sulfate Nausea And Vomiting and Other (See Comments)    Dizziness   Penicillins Nausea And Vomiting   Percocet [Oxycodone-Acetaminophen] Nausea And Vomiting   Codeine Diarrhea   Lisinopril Cough    Outpatient Encounter Medications as of 09/06/2021  Medication Sig   albuterol (PROVENTIL HFA;VENTOLIN HFA) 108 (90 Base) MCG/ACT inhaler Inhale 2 puffs every 6 (six) hours as needed into the lungs for wheezing or shortness of breath.   amLODipine (NORVASC) 10 MG tablet Take 1 tablet (10 mg total) by mouth daily.    atorvastatin (LIPITOR) 20 MG tablet Take 1 tablet (20 mg total) by mouth daily.   Blood Glucose Monitoring Suppl (ONE TOUCH ULTRA MINI) w/Device KIT Please use as directed.   buPROPion (WELLBUTRIN XL) 150 MG 24 hr tablet Take 1 tablet (150 mg total) by mouth daily.   Cholecalciferol 1000 units capsule Take 1 capsule (1,000 Units total) by mouth daily.   ferrous sulfate 325 (65 FE) MG tablet Take by mouth.   fluticasone (FLONASE) 50 MCG/ACT nasal spray Place 2 sprays into both nostrils daily.   glucose blood (ONE TOUCH ULTRA TEST) test strip Use as instructed   hydrochlorothiazide (HYDRODIURIL) 25 MG tablet TAKE 1 TABLET DAILY WITH LOSARTAN   Insulin Pen Needle 32G X 4 MM MISC 1 each by Does not apply route daily.   liraglutide (VICTOZA) 18 MG/3ML SOPN PLEASE START TAKING VICTOZA 0.6MG DAILY WEEK 1, THEN TAKE 1.2MG DAILY WEEK 2, AND THEN TAKE 1.8MG DAILY THEREAFTER   losartan (COZAAR) 100 MG tablet Take 1 tablet (100 mg total) by mouth daily.   metFORMIN (GLUCOPHAGE) 1000 MG tablet Take 1 tablet (1,000 mg total) by mouth 2 (two) times daily with a meal.   ONETOUCH DELICA LANCETS 68T MISC Please use as directed.   No facility-administered encounter medications on file as of 09/06/2021.    Patient Active Problem List   Diagnosis Date Noted   Elevated TSH 05/31/2021   Radicular pain in left arm 11/10/2020   Routine health maintenance 11/10/2020   Personal history of colonic polyps 11/10/2020   Elevated alkaline phosphatase level 03/17/2016   Carpal tunnel syndrome 02/11/2014   Iron deficiency anemia 08/29/2012   Obesity, BMI  35-40 06/11/2012   Dyslipidemia associated with type 2 diabetes mellitus ([place]) 06/30/2007   Major depressive disorder with single episode, in remission ([place]) 06/30/2007   Essential hypertension 06/30/2007   GERD 06/30/2007   Type 2 diabetes mellitus with neurological complications ([place]) 98/08/9146    Conditions to be addressed/monitored: HTN, DMII, Anxiety, and  Depression  Care Plan : CCM RN- Diabetes Type 2 (Adult), HTN, Depression/Anxiety  Updates made by Sherri Killian, RN since 09/06/2021 12:00 AM     Problem: Glycemic Management (Diabetes, Type 2), HTN, Depression/Anxiety   Priority: High     Goal: Glycemic, HTN, and anxiety/depression Management Optimized   Start Date: 04/19/2020  Recent Progress: On track  Priority: High  Note:   CARE PLAN ENTRY (see longitudinal plan of care for additional care plan information)  Objective:    Lab Results  Component Value Date   HGBA1C 6.8 (A) 05/31/2021   HGBA1C 8.0 (A) 11/09/2020   HGBA1C 7.1 (A) 03/28/2020   Lab Results  Component Value Date   MICROALBUR 1.4 08/03/2014   LDLCALC 79 05/31/2021   CREATININE 0.82 11/09/2020       Current Barriers:  Knowledge Deficits related to self management of chronic disease states- Successful outreach to patient,  she is complaining of having a urinary tract infection with the symptoms of chills, back hurting and frequency.  Patient has appointment at the clinic at 2:15 this afternoon. Patient requested we reschedule our conversation for Monday afternoon, 09/10/21. Case Manager Clinical Goal(s):  Over the next 30-60 days, patient will demonstrate improved adherence to prescribed treatment plan for diabetes self care/management as evidenced by:  daily monitoring and recording of CBG  adherence to ADA/ carb modified diet exercise at least 5 days/week adherence to prescribed medication regimen  Interventions:  Discussed plans with patient for ongoing care management follow up and provided patient with direct contact information for care management team Assessed patient's self management skills in regards to HTN, NIDDM, HLD, and anxiety/depression Evaluation of current treatment plan related to HTN, NIDDM, HLD, and depression/anxiety and patient's adherence to plan as established by provider. Reviewed medications with patient and assessed medication  taking behavior;  if indicated, discussed importance of medication adherence  Provided opportunity for patient to share the stress she is feeling and congratulated patient on exercising and improving her overall well being. Patient Self Care Activities:  UNABLE to independently get Hgb A1C under 7.0%-Patient meeting goal Unable to independently secure appointment with clinic behavioral health counselor  - keep all provider appointments  call to cancel if needed - keep a calendar with appointment dates - check blood sugar at prescribed times - check blood sugar if I feel it is too high or too low - take the blood sugar meter to all doctor visits - check blood pressure weekly - check out counseling - keep 90 percent of counseling appointments - schedule counseling appointment      Plan: Telephone follow up appointment with care management team member scheduled for:  09/10/21_0 :30  Sherri Killian, RN, BSN, [facility] Phone: [phone]: [phone]

## 2021-09-06 NOTE — Telephone Encounter (Signed)
Requesting to speak with a nurse about getting medication for UTI, pt sch to come into the office @ 2:15 today. Please call pt back.

## 2021-09-06 NOTE — Patient Instructions (Addendum)
Thank you, [name] for allowing [place] to provide your care today. Today we discussed .  Increased Urinary Frequency I believe you are having episodes of urinary incontinence which can happen as we get older. I do not suspect an infection at this time. If you begin to develop other symptoms, please call our clinic. Please try the bladder training instructions I have provided you.   History of diabetes We will be checking your A1c today.   High blood pressure Please purchase a blood pressure cuff and check your blood pressure at home. We will be changing one of your medications. Please stop taking your hydrochlorothiazide. We will be starting you on chlorthalidone.  Please follow-up in 2 weeks for a lab check and for [place] to discuss how you are doing.  I have ordered the following labs for you:  Lab Orders         BMP8+Anion Gap         POC Hbg A1C       Referrals ordered today:   Referral Orders  No referral(s) requested today     I have ordered the following medication/changed the following medications:   Stop the following medications: Medications Discontinued During This Encounter  Medication Reason   hydrochlorothiazide (HYDRODIURIL) 25 MG tablet      Start the following medications: Meds ordered this encounter  Medications   chlorthalidone (HYGROTON) 25 MG tablet    Sig: Take 1 tablet (25 mg total) by mouth daily.    Dispense:  30 tablet    Refill:  2      Follow up:  2 week blood pressure follow up    Remember: If you begin developing symptoms of urinary tract infection, please call our clinicKegel Exercises Kegel exercises can help strengthen your pelvic floor muscles. The pelvic floor is a group of muscles that support your rectum, small intestine, and bladder. In females, pelvic floor muscles also help support the uterus. These muscles help you control the flow of urine and stool (feces). Kegel exercises are painless and simple. They do not require any  equipment. Your provider may suggest Kegel exercises to: Improve bladder and bowel control. Improve sexual response. Improve weak pelvic floor muscles after surgery to remove the uterus (hysterectomy) or after pregnancy, in females. Improve weak pelvic floor muscles after prostate gland removal or surgery, in males. Kegel exercises involve squeezing your pelvic floor muscles. These are the same muscles you squeeze when you try to stop the flow of urine or keep from passing gas. The exercises can be done while sitting, standing, or lying down, but it is best to vary your position. Ask your health care provider which exercises are safe for you. Do exercises exactly as told by your health care provider and adjust them as directed. Do not begin these exercises until told by your health care provider. Exercises How to do Kegel exercises: Squeeze your pelvic floor muscles tight. You should feel a tight lift in your rectal area. If you are a female, you should also feel a tightness in your vaginal area. Keep your stomach, buttocks, and legs relaxed. Hold the muscles tight for up to 10 seconds. Breathe normally. Relax your muscles for up to 10 seconds. Repeat as told by your health care provider. Repeat this exercise daily as told by your health care provider. Continue to do this exercise for at least 4-6 weeks, or for as long as told by your health care provider. You may be referred to  a physical therapist who can help you learn more about how to do Kegel exercises. Depending on your condition, your health care provider may recommend: Varying how long you squeeze your muscles. Doing several sets of exercises every day. Doing exercises for several weeks. Making Kegel exercises a part of your regular exercise routine. This information is not intended to replace advice given to you by your health care provider. Make sure you discuss any questions you have with your health care provider. Document Revised:  02/22/2021 Document Reviewed: 02/22/2021 Elsevier Patient Education  2022 [employer].   Should you have any questions or concerns please call the internal medicine clinic at [phone].    Sanjuana Letters, D.O. [place]

## 2021-09-07 DIAGNOSIS — R350 Frequency of micturition: Secondary | ICD-10-CM | POA: Insufficient documentation

## 2021-09-07 NOTE — Assessment & Plan Note (Signed)
Assessment: SBP continues to be uncontrolled on max 3 drug therapy.  Patient has been trying to continue to exercise and diet regularly however blood pressure persistently elevated.  We will transition from HCTZ to chlorthalidone.  Can also consider cycling agents if no improvement such as spironolactone.  If still elevated can also consider switching from losartan to olmesartan.  Plan: -Discontinue HCTZ, start chlorthalidone -Continue amlodipine 10 mg and losartan 100 mg daily -Have patient return in 2 weeks for blood pressure recheck and BMP to assess electrolytes

## 2021-09-07 NOTE — Assessment & Plan Note (Signed)
Assessment: A1c has increased from 6.8-7.2.  Current regimen of metformin 1000 mg twice daily and Victoza.  Plan: -Continue lifestyle modifications and if no improvement on next visit in 3 months, consider addition of SGLT2 -Continue Victoza and metformin at 1000 mg twice daily

## 2021-09-07 NOTE — Progress Notes (Signed)
CC: Increased pelvic pressure, follow-up diabetes high blood pressure  HPI:  Sherri Gill is a 70 y.o. female with a past medical history stated below and presents today for follow up of her diabetes, elevated blood pressure and feelings of increased pelvic pressure. Please see problem based assessment and plan for additional details.  Past Medical History:  Diagnosis Date   Chronic anemia 08/29/2012   Need colonoscopy or report. Need iron panel.    Dyslipidemia 06/30/2007   Essential hypertension, benign 06/30/2007   GERD 06/30/2007   Hx of Herpes simplex meningitis 2015   Also noted to have primary empty sella on imaging at this admission   Insomnia 06/11/2012   Major depressive disorder, recurrent episode, moderate with anxious distress ([place]) 06/30/2007   Obesity, BMI 35-40 06/11/2012   Peripheral neuropathy 2/2 T2DM 12/28/2009   Primary empty sella syndrome ([place]) 2015   Noted on imaging during hospitalization for herpes meningitis; no pituitary mass, no hormone w/u at that time, hormonally asymptomatic   Type 2 diabetes mellitus with neurological complications ([place]) 08/24/2535    Current Outpatient Medications on File Prior to Visit  Medication Sig Dispense Refill   albuterol (PROVENTIL HFA;VENTOLIN HFA) 108 (90 Base) MCG/ACT inhaler Inhale 2 puffs every 6 (six) hours as needed into the lungs for wheezing or shortness of breath. 1 Inhaler 0   amLODipine (NORVASC) 10 MG tablet Take 1 tablet (10 mg total) by mouth daily. 90 tablet 2   atorvastatin (LIPITOR) 20 MG tablet Take 1 tablet (20 mg total) by mouth daily. 90 tablet 3   Blood Glucose Monitoring Suppl (ONE TOUCH ULTRA MINI) w/Device KIT Please use as directed. 1 each 0   buPROPion (WELLBUTRIN XL) 150 MG 24 hr tablet Take 1 tablet (150 mg total) by mouth daily. 90 tablet 3   Cholecalciferol 1000 units capsule Take 1 capsule (1,000 Units total) by mouth daily. 90 capsule 1   ferrous sulfate 325 (65 FE) MG tablet Take by mouth.      fluticasone (FLONASE) 50 MCG/ACT nasal spray Place 2 sprays into both nostrils daily. 1 g 2   glucose blood (ONE TOUCH ULTRA TEST) test strip Use as instructed 100 each 1   Insulin Pen Needle 32G X 4 MM MISC 1 each by Does not apply route daily. 100 each 3   liraglutide (VICTOZA) 18 MG/3ML SOPN PLEASE START TAKING VICTOZA 0.$RemoveBeforeDE'6MG'qDUqLxkWIUkMdpG$  DAILY WEEK 1, THEN TAKE 1.$RemoveBef'2MG'JjsLWfOybb$  DAILY WEEK 2, AND THEN TAKE 1.$RemoveBef'8MG'MgJISimGlJ$  DAILY THEREAFTER 9 mL 3   losartan (COZAAR) 100 MG tablet Take 1 tablet (100 mg total) by mouth daily. 90 tablet 3   metFORMIN (GLUCOPHAGE) 1000 MG tablet Take 1 tablet (1,000 mg total) by mouth 2 (two) times daily with a meal. 180 tablet 3   ONETOUCH DELICA LANCETS 64Q MISC Please use as directed. 100 each 0   No current facility-administered medications on file prior to visit.    Family History  Problem Relation Age of Onset   Dementia Mother     Social History   Socioeconomic History   Marital status: Single    Spouse name: Not on file   Number of children: Not on file   Years of education: 12   Highest education level: Not on file  Occupational History   Occupation: Press photographer    Employer: [employer]  Tobacco Use   Smoking status: Former    Types: Cigarettes    Quit date: 10/28/1978    Years since quitting: 42.8   Smokeless tobacco:  Never  Substance and Sexual Activity   Alcohol use: No    Alcohol/week: 0.0 standard drinks   Drug use: No   Sexual activity: Not Currently    Partners: Male    Birth control/protection: Post-menopausal  Other Topics Concern   Not on file  Social History Narrative   Not on file   Social Determinants of Health   Financial Resource Strain: Not on file  Food Insecurity: Not on file  Transportation Needs: Not on file  Physical Activity: Not on file  Stress: Not on file  Social Connections: Not on file  Intimate Partner Violence: Not on file    Review of Systems: ROS negative except for what is noted on the assessment and plan.  Vitals:   09/06/21 1445   BP: (!) 149/61  Pulse: 89  Temp: 98.8 F (37.1 C)  TempSrc: Oral  SpO2: 95%    Physical Exam: Constitutional: well-appearing, no acute distress HENT: normocephalic atraumatic Eyes: conjunctiva non-erythematous Neck: supple Cardiovascular: regular rate Pulmonary/Chest: normal work of breathing on room air Abdominal: soft, non-tender, non-distended MSK: normal bulk and tone. No CVA tenderness appreciated Neurological: alert & oriented x 3 Skin: warm and dry Psych: normal mood and thought process   Assessment & Plan:   See Encounters Tab for problem based charting.  Patient discussed with Dr. Delano Metz, D.O. [facility], PGY-2 Pager: [phone], Phone: [phone] Date 09/07/2021 Time 3:57 PM

## 2021-09-07 NOTE — Assessment & Plan Note (Signed)
Assessment: Patient states for the past few weeks she has had increased urinary frequency.  She states that throughout her morning she has noticed that she urinates multiple times in the morning and has to rush to the bathroom.  She also feels as though she has a pressure bilaterally in her pelvis.  Denies burning with urination, discharge, or hematuria.  She does endorse bilateral lower back pain however suspect MSK in etiology.  Dipstick UA completely unremarkable.  BMP ordered to assess for any electrolyte abnormalities to assess kidney function.  Do not suspect this is infectious in etiology.  Also do not suspect common logical etiology.  Suspect patient is having increased urinary frequency secondary to urinary incontinence.  She denies leakage of urine with coughing sneezing or laughing.  She denies feeling as though she is unable to completely empty her bladder.  Denies loss of urine with no warnings or triggers.  She just denies ever urinating on herself but states that she feels as though she does not make it to the bathroom in time she well.  We will start with giving patient Kegel exercises as well as bladder training.  Patient to follow-up in 2 weeks.  Plan: -Reassess in 2 weeks

## 2021-09-10 ENCOUNTER — Ambulatory Visit: Payer: Medicare HMO

## 2021-09-10 NOTE — Chronic Care Management (AMB) (Signed)
Care Management    RN Visit Note  09/10/2021 Name: Sherri Gill MRN: [id number] DOB: 06-16-1951  Subjective: Sherri Gill is a 70 y.o. year old female who is a primary care patient of [name], MD. The care management team was consulted for assistance with disease management and care coordination needs.    Engaged with patient by telephone for follow up visit in response to provider referral for case management and/or care coordination services.   Consent to Services:   Ms. Koogler was given information about Care Management services today including:  Care Management services includes personalized support from designated clinical staff supervised by her physician, including individualized plan of care and coordination with other care providers 24/7 contact phone numbers for assistance for urgent and routine care needs. The patient may stop case management services at any time by phone call to the office staff.  Patient agreed to services and consent obtained.   Assessment: Review of patient past medical history, allergies, medications, health status, including review of consultants reports, laboratory and other test data, was performed as part of comprehensive evaluation and provision of chronic care management services.   SDOH (Social Determinants of Health) assessments and interventions performed:    Care Plan  Allergies  Allergen Reactions   Codeine Sulfate Nausea And Vomiting and Other (See Comments)    Dizziness   Penicillins Nausea And Vomiting   Percocet [Oxycodone-Acetaminophen] Nausea And Vomiting   Codeine Diarrhea   Lisinopril Cough    Outpatient Encounter Medications as of 09/10/2021  Medication Sig   albuterol (PROVENTIL HFA;VENTOLIN HFA) 108 (90 Base) MCG/ACT inhaler Inhale 2 puffs every 6 (six) hours as needed into the lungs for wheezing or shortness of breath.   amLODipine (NORVASC) 10 MG tablet Take 1 tablet (10 mg total) by mouth daily.    atorvastatin (LIPITOR) 20 MG tablet Take 1 tablet (20 mg total) by mouth daily.   Blood Glucose Monitoring Suppl (ONE TOUCH ULTRA MINI) w/Device KIT Please use as directed.   buPROPion (WELLBUTRIN XL) 150 MG 24 hr tablet Take 1 tablet (150 mg total) by mouth daily.   chlorthalidone (HYGROTON) 25 MG tablet Take 1 tablet (25 mg total) by mouth daily.   Cholecalciferol 1000 units capsule Take 1 capsule (1,000 Units total) by mouth daily.   ferrous sulfate 325 (65 FE) MG tablet Take by mouth.   fluticasone (FLONASE) 50 MCG/ACT nasal spray Place 2 sprays into both nostrils daily.   glucose blood (ONE TOUCH ULTRA TEST) test strip Use as instructed   Insulin Pen Needle 32G X 4 MM MISC 1 each by Does not apply route daily.   liraglutide (VICTOZA) 18 MG/3ML SOPN PLEASE START TAKING VICTOZA 0.6MG DAILY WEEK 1, THEN TAKE 1.2MG DAILY WEEK 2, AND THEN TAKE 1.8MG DAILY THEREAFTER   losartan (COZAAR) 100 MG tablet Take 1 tablet (100 mg total) by mouth daily.   metFORMIN (GLUCOPHAGE) 1000 MG tablet Take 1 tablet (1,000 mg total) by mouth 2 (two) times daily with a meal.   ONETOUCH DELICA LANCETS 60A MISC Please use as directed.   No facility-administered encounter medications on file as of 09/10/2021.    Patient Active Problem List   Diagnosis Date Noted   Increased urinary frequency 09/07/2021   Elevated TSH 05/31/2021   Radicular pain in left arm 11/10/2020   Routine health maintenance 11/10/2020   Personal history of colonic polyps 11/10/2020   Elevated alkaline phosphatase level 03/17/2016   Carpal tunnel syndrome 02/11/2014  Iron deficiency anemia 08/29/2012   Obesity, BMI 35-40 06/11/2012   Dyslipidemia associated with type 2 diabetes mellitus ([place]) 06/30/2007   Major depressive disorder with single episode, in remission ([place]) 06/30/2007   Essential hypertension 06/30/2007   GERD 06/30/2007   Type 2 diabetes mellitus with neurological complications ([place]) 91/69/4503    Conditions to be  addressed/monitored: COPD, Anxiety, and Depression  Care Plan : CCM RN- Diabetes Type 2 (Adult), HTN, Depression/Anxiety  Updates made by [name], RN since 09/10/2021 12:00 AM     Problem: Glycemic Management (Diabetes, Type 2), HTN, Depression/Anxiety   Priority: High     Goal: Glycemic, HTN, and anxiety/depression Management Optimized   Start Date: 04/19/2020  Recent Progress: On track  Priority: High  Note:   CARE PLAN ENTRY (see longitudinal plan of care for additional care plan information)  Objective:    Lab Results  Component Value Date   HGBA1C 7.2 (A) 09/06/2021   HGBA1C 6.8 (A) 05/31/2021   HGBA1C 8.0 (A) 11/09/2020   Lab Results  Component Value Date   MICROALBUR 1.4 08/03/2014   LDLCALC 79 05/31/2021   CREATININE 0.82 11/09/2020       Current Barriers:  Knowledge Deficits related to self management of chronic disease states- Successful outreach to patient this afternoon.  She feels her bladder symptoms have improved, since she started drinking more water.  She feels some of her issues were related to being dehydrated.  Patient shared that she is still walking 3-4 times per week and she is hoping to be able to  get her next A1C down as it increased slightly with last weeks office visit. Case Manager Clinical Goal(s):  Over the next 30-60 days, patient will demonstrate improved adherence to prescribed treatment plan for diabetes self care/management as evidenced by:  daily monitoring and recording of CBG  adherence to ADA/ carb modified diet exercise at least 5 days/week adherence to prescribed medication regimen  Interventions:  Discussed plans with patient for ongoing care management follow up and provided patient with direct contact information for care management team Assessed patient's self management skills in regards to HTN, NIDDM, HLD, and anxiety/depression- Patient notes her son is doing better and helping her at home.  This seems to be  relieving a lot of stress for the patient.  Her son is an adult schizophrenic and she manages his care.   Evaluation of current treatment plan related to HTN, NIDDM, HLD, and depression/anxiety and patient's adherence to plan as established by provider.-Patient continuing counseling with Dr. Theodis Shove. Reviewed medications with patient and assessed medication taking behavior;  if indicated, discussed importance of medication adherence  Provided opportunity for patient to share the stress she is feeling and congratulated patient on exercising and improving her overall well being. Patient Self Care Activities:  UNABLE to independently get Hgb A1C under 7.0%-  Patient A1C last week 7.2, she stated she has not been very good at checking her CBG's lately and she needs to try and be better at checking daily.  Follow up with patient in 30 days. Unable to independently secure appointment with clinic behavioral health counselor  - keep all provider appointments    call to cancel if needed - keep a calendar with appointment dates - check blood sugar at prescribed times - check blood sugar if I feel it is too high or too low - take the blood sugar meter to all doctor visits - check blood pressure weekly - check out counseling - keep  90 percent of counseling appointments - schedule counseling appointment      Plan: Telephone follow up appointment with care management team member scheduled for:  19 days  [name], RN, BSN, CCM Care Management Coordinator [facility] Internal Medicine Phone: [phone]: [phone]

## 2021-09-10 NOTE — Patient Instructions (Signed)
Visit Information  - keep all provider appointments  - call to cancel if needed - keep a calendar with appointment dates - check blood sugar at prescribed times - check blood sugar if I feel it is too high or too low - take the blood sugar meter to all doctor visits - check blood pressure weekly - continue counseling - keep 90 percent of counseling appointments  The patient verbalized understanding of instructions, educational materials, and care plan provided today and declined offer to receive copy of patient instructions, educational materials, and care plan.   Telephone follow up appointment with care management team member scheduled for: October 08, 2021@1PM   10-16-1999, RN, BSN, CCM Care Management Coordinator [facility] Internal Medicine Phone: [phone]: [phone]

## 2021-09-13 NOTE — Progress Notes (Signed)
Internal Medicine Clinic Attending  Case discussed with Dr. Katsadouros  At the time of the visit.  We reviewed the resident's history and exam and pertinent patient test results.  I agree with the assessment, diagnosis, and plan of care documented in the resident's note.  

## 2021-09-19 ENCOUNTER — Telehealth: Payer: Self-pay | Admitting: Behavioral Health

## 2021-09-19 ENCOUNTER — Ambulatory Visit: Payer: Medicare HMO | Admitting: Behavioral Health

## 2021-09-19 NOTE — Telephone Encounter (Signed)
Unable to lv msg for Pt due to Vmbox not set up. Will r/s Pt for another visit.  Dr. Theodis Shove

## 2021-09-26 ENCOUNTER — Encounter: Payer: Medicare HMO | Admitting: Internal Medicine

## 2021-09-26 NOTE — Progress Notes (Deleted)
Last office visit 11/10  Urinary frequency  HTN BP Readings from Last 3 Encounters:  09/06/21 (!) 149/61  08/23/21 (!) 160/72  05/31/21 (!) 141/65   Meds: Chlorthalidone 25mg , Amlodipine 10 mg, Losartan 100 mg  -recheck bmp

## 2021-09-28 ENCOUNTER — Encounter: Payer: Self-pay | Admitting: Internal Medicine

## 2021-09-28 ENCOUNTER — Ambulatory Visit (INDEPENDENT_AMBULATORY_CARE_PROVIDER_SITE_OTHER): Payer: Medicare HMO | Admitting: Internal Medicine

## 2021-09-28 VITALS — BP 135/66 | HR 81 | Temp 98.0°F | Wt 203.0 lb

## 2021-09-28 DIAGNOSIS — R1905 Periumbilic swelling, mass or lump: Secondary | ICD-10-CM | POA: Diagnosis not present

## 2021-09-28 DIAGNOSIS — R350 Frequency of micturition: Secondary | ICD-10-CM | POA: Diagnosis not present

## 2021-09-28 DIAGNOSIS — R9389 Abnormal findings on diagnostic imaging of other specified body structures: Secondary | ICD-10-CM

## 2021-09-28 DIAGNOSIS — I10 Essential (primary) hypertension: Secondary | ICD-10-CM

## 2021-09-28 DIAGNOSIS — K429 Umbilical hernia without obstruction or gangrene: Secondary | ICD-10-CM | POA: Insufficient documentation

## 2021-09-28 DIAGNOSIS — Z23 Encounter for immunization: Secondary | ICD-10-CM | POA: Diagnosis not present

## 2021-09-28 NOTE — Assessment & Plan Note (Signed)
Received vaccine in clinic 09/28/2021.

## 2021-09-28 NOTE — Assessment & Plan Note (Signed)
Assessment: Patient endorses some improvement in urinary urgency after changing hypertension regimen. She states that she notices the most significant urgency after waking from sleep or when she works long shifts at work and doesn't take restroom breaks. Plan: Advised patient to make timed trips to the restroom regardless of if she feels the urge to urinate or not.

## 2021-09-28 NOTE — Progress Notes (Signed)
   CC: abdominal discomfort  HPI:  [name] is a 70 y.o. F with PMH detailed below who presents with chief complaint of abdominal discomfort. Please see problem based charting for detailed assessment and plan.  Past Medical History:  Diagnosis Date   Chronic anemia 08/29/2012   Need colonoscopy or report. Need iron panel.    Dyslipidemia 06/30/2007   Essential hypertension, benign 06/30/2007   GERD 06/30/2007   Hx of Herpes simplex meningitis 2015   Also noted to have primary empty sella on imaging at this admission   Insomnia 06/11/2012   Major depressive disorder, recurrent episode, moderate with anxious distress ([place]) 06/30/2007   Obesity, BMI 35-40 06/11/2012   Peripheral neuropathy 2/2 T2DM 12/28/2009   Primary empty sella syndrome ([place]) 2015   Noted on imaging during hospitalization for herpes meningitis; no pituitary mass, no hormone w/u at that time, hormonally asymptomatic   Type 2 diabetes mellitus with neurological complications ([place]) 25/00/3704   Review of Systems:  Review of Systems  Constitutional:  Negative for fever and weight loss.  Cardiovascular:  Negative for chest pain.  Gastrointestinal:  Positive for abdominal pain. Negative for blood in stool, constipation, diarrhea, melena, nausea and vomiting.  Genitourinary:  Positive for urgency. Negative for flank pain.  Psychiatric/Behavioral:  The patient is nervous/anxious.     Physical Exam:  Vitals:   09/28/21 1045 09/28/21 1119  BP: (!) 144/61 135/66  Pulse: 80 81  Temp: 98 F (36.7 C)   TempSrc: Oral   SpO2: 99%   Weight: 203 lb (92.1 kg)    Constitutional: Pleasant, no acute distress. Cardio: Regular rate and rhythm. No murmurs, rubs, gallops. Pulm: Clear to auscultation bilaterally. Abdomen: Soft. Presence of a firm nodule in umbilicus that is tender to palpation. Presence of diastasis recti. Otherwise non-tender, non distention noted. MSK: No extremity edema noted. Skin: Warm and dry. Neuro:  Alert and oriented x3. No focal deficit noted. Psych: Anxious mood, tearful affect.  Assessment & Plan:   See Encounters Tab for problem based charting.  Patient seen with Dr.  Saverio Danker

## 2021-09-28 NOTE — Assessment & Plan Note (Signed)
Assessment: Patient's blood pressure improved from previous visit at this time, 149/61>135/66. She is currently on a regimen of chlorthalidone 25 mg daily, amlodipine 10 mg daily, losartan 100 mg daily.  Plan: Reassess BMP today. Will continue to monitor for BP control on new regimen. If this is unable to provide adequate control, can consider switching losartan to olmesartan or cycling agents such as spironolactone.

## 2021-09-28 NOTE — Patient Instructions (Signed)
Thank you for visiting the Internal Medicine Clinic today. It was a pleasure to meet you! Today we discussed your abdominal pain and knot around your belly button as well as your continued urinary concerns.  We will order an ultrasound of your abdomen to evaluate this new knot so that we can better assess if it is a hernia or not. If you continue to have pelvic pressure, we will also order a pelvic ultrasound to make sure there is no involvement of your ovaries in this discomfort.  For your urinary incontinence, I would like for you to try going to the restroom every hour, even if you do not feel the urge to go, to help train your body and hopefully alleviate some of the urinary symptoms that you are having.  I have ordered the following for you:  Lab orders: BMP: To evaluate your kidneys and electrolytes  Vaccines given today: Tetanus  Tests ordered: Colonoscopy Ultrasound of your abdomen  Follow-up: In around 1 month.  Remember: If you have any questions or concerns, please call our clinic at [phone] between 9am-5pm and after hours call [phone] and ask for the internal medicine resident on call. If you feel you are having a medical emergency please call 911.  Farrel Gordon, DO

## 2021-09-28 NOTE — Assessment & Plan Note (Addendum)
Assessment: Patient endorses abdominal discomfort over at least the last month however states that since around November 17 when she lifted heavy crates of water, she has had exquistite tenderness over her umbilicus and has noted a new protrusion. She has historically had an 'innie' belly button. She denies changes in bowel habits and consistency and states that her discomfort is alleviated by bowel movements at times. There is no blood in her stool. She has not noticed a bulge that cannot be pushed back into place. The pain is more of an ache and is at times minor, at other times severe. She is overdue for colonoscopy. No family history of colon cancer. Still has appendix, gallbladder. Physical exam was remarkable for presence of a firm nodule in umbilicus that is tender to palpation, presence of diastasis recti, otherwise non-tender, non distention noted. The physical exam is not quite consistent with umbilical hernia but this may be present. Plan: Will refer for screening colonoscopy to get her up to date on this. Will also order abdominal ultrasound to assess this lump. If it is indeed a hernia, we will refer the patient to general surgery for further discussion on next steps. If she has persistent lower abdominal pressure as previously mentioned, she has agreed to let us know so that we can place an order for pelvic ultrasound.  Addendum: Abdominal ultrasound concerning for umbilical hernia containing bowel.  Dr. [name] has attempted to contact the patient regarding these results however was able to speak with her or leave a voicemail.  She is already placed a referral for general surgery.

## 2021-09-29 LAB — BMP8+ANION GAP
Anion Gap: 18 mmol/L (ref 10.0–18.0)
BUN/Creatinine Ratio: 19 (ref 12–28)
BUN: 16 mg/dL (ref 8–27)
CO2: 27 mmol/L (ref 20–29)
Calcium: 10 mg/dL (ref 8.7–10.3)
Chloride: 97 mmol/L (ref 96–106)
Creatinine, Ser: 0.83 mg/dL (ref 0.57–1.00)
Glucose: 90 mg/dL (ref 70–99)
Potassium: 3.6 mmol/L (ref 3.5–5.2)
Sodium: 142 mmol/L (ref 134–144)
eGFR: 77 mL/min/{1.73_m2} (ref >59)

## 2021-10-01 NOTE — Addendum Note (Signed)
Addended by: Marcelino Duster on: 10/01/2021 01:02 PM   Modules accepted: Orders

## 2021-10-01 NOTE — Progress Notes (Signed)
Internal Medicine Clinic Attending  I saw and evaluated the patient.  I personally confirmed the key portions of the history and exam documented by Dr. Marlou Sa and I reviewed pertinent patient test results.  The assessment, diagnosis, and plan were formulated together and I agree with the documentation in the resident's note. Abdominal exam not consistent with umbilical hernia and area of tenderness appears most consistent with normal naval tissue. No underlying fluctuance, erythema, warmth to suggest fluid collection. However, given patient concern and tenderness to palpation will obtain ultrasound to further evaluate. Discussed ovarian pathology given recently described pelvic pressure symptoms and urinary urgency, however patient declined pelvic US. Encouraged return if symptoms do not improve or worsen.

## 2021-10-08 ENCOUNTER — Ambulatory Visit: Payer: Medicare HMO

## 2021-10-08 NOTE — Addendum Note (Signed)
Addended by: Renato Battles on: 10/08/2021 10:11 AM   Modules accepted: Orders

## 2021-10-08 NOTE — Patient Instructions (Signed)
Visit Information  Thank you for taking time to visit with me today. Please don't hesitate to contact me if I can be of assistance to you before our next scheduled telephone appointment.  Our next appointment is by telephone on 10/16/21 at 1 PM  Please call the care guide team at [phone] if you need to cancel or reschedule your appointment.   If you are experiencing a Mental Health or [place] or need someone to talk to, please call the [place] National Suicide Prevention Lifeline: (708)235-3337 or TTY: 5200451535 TTY 820-121-9353) to talk to a trained counselor   The patient verbalized understanding of instructions, educational materials, and care plan provided today and declined offer to receive copy of patient instructions, educational materials, and care plan.   The patient has been provided with contact information for the care management team and has been advised to call with any health related questions or concerns.   [name], RN, BSN, CCM Care Management Coordinator [facility] Internal Medicine Phone: [phone]: [phone]

## 2021-10-08 NOTE — Chronic Care Management (AMB) (Signed)
Care Management    RN Visit Note  10/08/2021 Name: Sherri Gill MRN: [id number] DOB: [birth year]  Subjective: Sherri Gill is a 70 y.o. year old female who is a primary care patient of Sherri Pou, MD. The care management team was consulted for assistance with disease management and care coordination needs.    Engaged with patient by telephone for follow up visit in response to provider referral for case management and/or care coordination services.   Consent to Services:   Sherri Gill was given information about Care Management services today including:  Care Management services includes personalized support from designated clinical staff supervised by her physician, including individualized plan of care and coordination with other care providers 24/7 contact phone numbers for assistance for urgent and routine care needs. The patient may stop case management services at any time by phone call to the office staff.  Patient agreed to services and consent obtained.   Assessment: Review of patient past medical history, allergies, medications, health status, including review of consultants reports, laboratory and other test data, was performed as part of comprehensive evaluation and provision of chronic care management services.   SDOH (Social Determinants of Health) assessments and interventions performed:    Care Plan  Allergies  Allergen Reactions   Codeine Sulfate Nausea And Vomiting and Other (See Comments)    Dizziness   Penicillins Nausea And Vomiting   Percocet [Oxycodone-Acetaminophen] Nausea And Vomiting   Codeine Diarrhea   Lisinopril Cough    Outpatient Encounter Medications as of 10/08/2021  Medication Sig   albuterol (PROVENTIL HFA;VENTOLIN HFA) 108 (90 Base) MCG/ACT inhaler Inhale 2 puffs every 6 (six) hours as needed into the lungs for wheezing or shortness of breath.   amLODipine (NORVASC) 10 MG tablet Take 1 tablet (10 mg total) by mouth daily.    atorvastatin (LIPITOR) 20 MG tablet Take 1 tablet (20 mg total) by mouth daily.   Blood Glucose Monitoring Suppl (ONE TOUCH ULTRA MINI) w/Device KIT Please use as directed.   buPROPion (WELLBUTRIN XL) 150 MG 24 hr tablet Take 1 tablet (150 mg total) by mouth daily.   chlorthalidone (HYGROTON) 25 MG tablet Take 1 tablet (25 mg total) by mouth daily.   Cholecalciferol 1000 units capsule Take 1 capsule (1,000 Units total) by mouth daily.   ferrous sulfate 325 (65 FE) MG tablet Take by mouth.   fluticasone (FLONASE) 50 MCG/ACT nasal spray Place 2 sprays into both nostrils daily.   glucose blood (ONE TOUCH ULTRA TEST) test strip Use as instructed   Insulin Pen Needle 32G X 4 MM MISC 1 each by Does not apply route daily.   liraglutide (VICTOZA) 18 MG/3ML SOPN PLEASE START TAKING VICTOZA 0.6MG DAILY WEEK 1, THEN TAKE 1.2MG DAILY WEEK 2, AND THEN TAKE 1.8MG DAILY THEREAFTER   losartan (COZAAR) 100 MG tablet Take 1 tablet (100 mg total) by mouth daily.   metFORMIN (GLUCOPHAGE) 1000 MG tablet Take 1 tablet (1,000 mg total) by mouth 2 (two) times daily with a meal.   ONETOUCH DELICA LANCETS 85Y MISC Please use as directed.   No facility-administered encounter medications on file as of 10/08/2021.    Patient Active Problem List   Diagnosis Date Noted   Need for tetanus booster 85/11/7739   Periumbilical mass 28/78/6767   Increased urinary frequency 09/07/2021   Elevated TSH 05/31/2021   Radicular pain in left arm 11/10/2020   Routine health maintenance 11/10/2020   Personal history of colonic polyps 11/10/2020  Elevated alkaline phosphatase level 03/17/2016   Carpal tunnel syndrome 02/11/2014   Iron deficiency anemia 08/29/2012   Obesity, BMI 35-40 06/11/2012   Dyslipidemia associated with type 2 diabetes mellitus ([place]) 06/30/2007   Major depressive disorder with single episode, in remission ([place]) 06/30/2007   Essential hypertension 06/30/2007   GERD 06/30/2007   Type 2 diabetes mellitus with  neurological complications ([place]) 00/86/7619    Conditions to be addressed/monitored: HTN and DMII  Care Plan : CCM RN- Diabetes Type 2 (Adult), HTN, Depression/Anxiety  Updates made by Sherri Killian, RN since 10/08/2021 12:00 AM     Problem: Glycemic Management (Diabetes, Type 2), HTN, Depression/Anxiety   Priority: High     Goal: Glycemic, HTN, and anxiety/depression Management Optimized   Start Date: 04/19/2020  Recent Progress: On track  Priority: High  Note:   Current Barriers: Successful outreach to patient this afternoon.  Patient stated she is still having cramp type pain in her uterus area.  She is taking Ibuprofen for pain, stated that she does not get relief from acetaminophen.  Patient was in the clinic last week and had a CBG reading of 90 and BP 144/61 and 135/66.  Patient is anxious to have her ultrasound as she has been researching her pain on the Internet.  This RNCM advised patient that she should stop looking up medical advise from the Internet.  Patient seems stable with her BP and DM2, she is having more issues with her abdomen.  Patient requested this RNCM send her a couple of my business cards.  I had sent some a few months ago but patient did not receive. Knowledge Deficits related to plan of care for management of HTN and DMII  Chronic Disease Management support and education needs related to HTN and DMII   RNCM Clinical Goal(s):  Patient will verbalize understanding of plan for management of HTN and DMII as evidenced by discussions with provider and RNCM take all medications exactly as prescribed and will call provider for medication related questions as evidenced by A1C and blood pressure improvements. continue to work with RN Care Manager to address care management and care coordination needs related to  HTN and DMII as evidenced by adherence to CM Team Scheduled appointments through collaboration with RN Care manager, provider, and care team.    Interventions: 1:1 collaboration with primary care provider regarding development and update of comprehensive plan of care as evidenced by provider attestation and co-signature Inter-disciplinary care team collaboration (see longitudinal plan of care) Evaluation of current treatment plan related to  self management and patient's adherence to plan as established by provider   Diabetes Interventions:  (Status:  Goal on track:  NO.) Long Term Goal Assessed patient's understanding of A1c goal: <7% Reviewed medications with patient and discussed importance of medication adherence Discussed plans with patient for ongoing care management follow up and provided patient with direct contact information for care management team Review of patient status, including review of consultants reports, relevant laboratory and other test results, and medications completed Lab Results  Component Value Date   HGBA1C 7.2 (A) 09/06/2021   Hypertension Interventions:  (Status:  Goal on track:  Yes.) Long Term Goal Last practice recorded BP readings:  BP Readings from Last 3 Encounters:  09/28/21 135/66  09/06/21 (!) 149/61  08/23/21 (!) 160/72  Most recent eGFR/CrCl:  Lab Results  Component Value Date   EGFR 77 09/28/2021    No components found for: CRCL  Reviewed medications with patient and discussed  importance of compliance Discussed plans with patient for ongoing care management follow up and provided patient with direct contact information for care management team  Patient Goals/Self-Care Activities: Take all medications as prescribed Attend all scheduled provider appointments Call pharmacy for medication refills 3-7 days in advance of running out of medications Call provider office for new concerns or questions   Follow Up Plan:  The patient has been provided with contact information for the care management team and has been advised to call with any health related questions or concerns.                Plan: The patient has been provided with contact information for the care management team and has been advised to call with any health related questions or concerns.  Sherri Killian, RN, BSN, CCM Care Management Coordinator [facility] Phone: [phone]: [phone]

## 2021-10-11 ENCOUNTER — Ambulatory Visit: Payer: Medicare HMO | Admitting: Behavioral Health

## 2021-10-11 DIAGNOSIS — F331 Major depressive disorder, recurrent, moderate: Secondary | ICD-10-CM

## 2021-10-11 DIAGNOSIS — F419 Anxiety disorder, unspecified: Secondary | ICD-10-CM

## 2021-10-11 NOTE — BH Specialist Note (Signed)
Integrated Behavioral Health via Telemedicine Visit  10/11/2021 Sherri Gill [id number]  Number of [place] visits: 6/6 Session Start time: 11:00am  Session End time: 11:30am Total time: 30  Referring Provider: Dr. Dorian Pod, MD Patient/Family location: Pt is home in private [facility] Provider location: [facility] Office All persons participating in visit: Pt & Clinician Types of Service: Individual psychotherapy  I connected with [name] and/or [name]  self  via  Telephone or Video Enabled Telemedicine Application  (Video is Caregility application) and verified that I am speaking with the correct person using two identifiers. Discussed confidentiality:  6th visit  I discussed the limitations of telemedicine and the availability of in person appointments.  Discussed there is a possibility of technology failure and discussed alternative modes of communication if that failure occurs.  I discussed that engaging in this telemedicine visit, they consent to the provision of behavioral healthcare and the services will be billed under their insurance.  Patient and/or legal guardian expressed understanding and consented to Telemedicine visit:  6th visit  Presenting Concerns: Patient and/or family reports the following symptoms/concerns: elevated anx due to health concerns for her recent pain in the abdomena  Duration of problem: a few weeks; Severity of problem: moderate  Patient and/or Family's Strengths/Protective Factors: Social connections, Concrete supports in place (healthy food, safe environments, etc.), Sense of purpose, and Physical Health (exercise, healthy diet, medication compliance, etc.)  Goals Addressed: Patient will:  Reduce symptoms of: anxiety, depression, and stress   Increase knowledge and/or ability of: coping skills and stress reduction   Demonstrate ability to: Increase healthy adjustment to current life circumstances  Progress  towards Goals: Ongoing  Interventions: Interventions utilized:  Solution-Focused Strategies and Supportive Counseling Standardized Assessments completed:  screeners prn  Patient and/or Family Response: Pt is receptive to call today. Asked Pt about 4 missed calls to connect for psychotherapy. Pt confused about reason, but is not worried.  Pt requests future appts  Assessment: Patient currently experiencing elevated anx due to recent health status changes.   Patient may benefit from cont'd support for her health status changes & adjustment to these issues.  Plan: Follow up with behavioral health clinician on : 2-3 wks on telehealth for 60 min Behavioral recommendations: [place] as able Referral(s): [place] (In Clinic)  I discussed the assessment and treatment plan with the patient and/or parent/guardian. They were provided an opportunity to ask questions and all were answered. They agreed with the plan and demonstrated an understanding of the instructions.   They were advised to call back or seek an in-person evaluation if the symptoms worsen or if the condition fails to improve as anticipated.  [name], LMFT

## 2021-10-12 ENCOUNTER — Other Ambulatory Visit: Payer: Self-pay

## 2021-10-12 ENCOUNTER — Ambulatory Visit (HOSPITAL_COMMUNITY)
Admission: RE | Admit: 2021-10-12 | Discharge: 2021-10-12 | Disposition: A | Discharge status: Home / Self Care | Payer: Medicare HMO | Source: Ambulatory Visit | Attending: Internal Medicine | Admitting: Internal Medicine

## 2021-10-12 ENCOUNTER — Ambulatory Visit (HOSPITAL_COMMUNITY): Payer: Medicare HMO

## 2021-10-12 DIAGNOSIS — N329 Bladder disorder, unspecified: Secondary | ICD-10-CM | POA: Insufficient documentation

## 2021-10-12 DIAGNOSIS — R9389 Abnormal findings on diagnostic imaging of other specified body structures: Secondary | ICD-10-CM | POA: Diagnosis not present

## 2021-10-12 DIAGNOSIS — R350 Frequency of micturition: Secondary | ICD-10-CM | POA: Diagnosis not present

## 2021-10-12 DIAGNOSIS — R1030 Lower abdominal pain, unspecified: Secondary | ICD-10-CM | POA: Diagnosis not present

## 2021-10-15 ENCOUNTER — Other Ambulatory Visit: Payer: Self-pay | Admitting: Internal Medicine

## 2021-10-15 ENCOUNTER — Telehealth: Payer: Self-pay | Admitting: Internal Medicine

## 2021-10-15 DIAGNOSIS — R9389 Abnormal findings on diagnostic imaging of other specified body structures: Secondary | ICD-10-CM | POA: Insufficient documentation

## 2021-10-15 NOTE — Addendum Note (Signed)
Addended by: Renato Battles on: 10/15/2021 02:11 PM   Modules accepted: Orders

## 2021-10-15 NOTE — Telephone Encounter (Signed)
Spoke with the patient to inform her of her transvaginal ultrasound results which was:  Heterogeneous endometrial thickening with appearance of moderate complex fluid in the endometrial canal, possible hemorrhagic material. Endometrial thickness is considered abnormal for an asymptomatic post-menopausal female. Endometrial sampling should be considered to exclude carcinoma.  I explained to her that I was placing an urgent referral to OB/Gyn for further evaluation of these findings. She was concerned that she may have cancer and I explained that I could not answer that question for her as I don't have the specialized knowledge or more information that would be needed for that diagnosis. She was not happy with this answer and felt that I was giving her bad news and refusing to answer any questions about the news. She asked if she could talk to someone else about these results to which I reiterated that it would need to be a specialist who can do further work up who answers these very specific questions. I explained to her that the imaging and results would need to be evaluated by a specialist so that they could decide whether they wanted to monitor or sample these findings, and that this is not something we do in our clinic. She was upset that I asked if she had been treated for UTI because she is frustrated seeing multiple doctors and having the same complaint. I reassured her that in November her urine did not show infection and confirmed that she was feeling well now, to which she stated her symptoms had much improved. She denied any urinary frequency, urgency, dysuria. I counseled her on returning for a visit if these symptoms recur so that we can evaluate her for UTI. She expressed concern about moving her upcoming abdominal ultrasound because of the holiday however she thought it was Friday, and when I confirmed that it is Thursday at 9:00 she was reassured.  She is requesting a OB/Gyn in [place] and  was not happy with the original practice that I placed her referral with. I explained that we could change the practice if she wanted to and she decided to keep it with this practice at this time and contact our clinic if she finds a different practice that she would prefer instead.  She asked when she can speak with Dr. [name] again because she has some questions for her to which I informed her that I do not have that information and directed her to the front desk schedulers to have scheduling questions answered.

## 2021-10-15 NOTE — Assessment & Plan Note (Signed)
Patient elected to undergo transvaginal ultrasound as discussed at this visit due to increasing pelvic pressure. Assessment: Findings included Heterogeneous endometrial thickening with appearance of moderate complex fluid in the endometrial canal, possible hemorrhagic material. Endometrial thickness is considered abnormal for an asymptomatic post-menopausal female. Endometrial sampling should be considered to exclude carcinoma. Plan: Urgent referral for OB/GYN placed.

## 2021-10-15 NOTE — Addendum Note (Signed)
Addended by: Renato Battles on: 10/15/2021 02:02 PM   Modules accepted: Orders

## 2021-10-16 ENCOUNTER — Ambulatory Visit: Payer: Medicare HMO

## 2021-10-16 ENCOUNTER — Telehealth: Payer: Self-pay

## 2021-10-16 NOTE — Addendum Note (Signed)
Addended by: Marcelino Duster on: 10/16/2021 11:39 AM   Modules accepted: Orders

## 2021-10-16 NOTE — Chronic Care Management (AMB) (Signed)
Care Management    RN Visit Note  10/16/2021 Name: Sherri Gill MRN: [id number] DOB: 1950/12/19  Subjective: Sherri Gill is a 70 y.o. year old female who is a primary care patient of Sherri Pou, MD. The care management team was consulted for assistance with disease management and care coordination needs.    Engaged with patient by telephone for follow up visit in response to provider referral for case management and/or care coordination services.   Consent to Services:   [name] was given information about Care Management services today including:  Care Management services includes personalized support from designated clinical staff supervised by her physician, including individualized plan of care and coordination with other care providers 24/7 contact phone numbers for assistance for urgent and routine care needs. The patient may stop case management services at any time by phone call to the office staff.  Patient agreed to services and consent obtained.   Assessment: Review of patient past medical history, allergies, medications, health status, including review of consultants reports, laboratory and other test data, was performed as part of comprehensive evaluation and provision of chronic care management services.   SDOH (Social Determinants of Health) assessments and interventions performed:    Care Plan  Allergies  Allergen Reactions   Codeine Sulfate Nausea And Vomiting and Other (See Comments)    Dizziness   Penicillins Nausea And Vomiting   Percocet [Oxycodone-Acetaminophen] Nausea And Vomiting   Codeine Diarrhea   Lisinopril Cough    Outpatient Encounter Medications as of 10/16/2021  Medication Sig   albuterol (PROVENTIL HFA;VENTOLIN HFA) 108 (90 Base) MCG/ACT inhaler Inhale 2 puffs every 6 (six) hours as needed into the lungs for wheezing or shortness of breath.   amLODipine (NORVASC) 10 MG tablet Take 1 tablet (10 mg total) by mouth daily.    atorvastatin (LIPITOR) 20 MG tablet Take 1 tablet (20 mg total) by mouth daily.   Blood Glucose Monitoring Suppl (ONE TOUCH ULTRA MINI) w/Device KIT Please use as directed.   buPROPion (WELLBUTRIN XL) 150 MG 24 hr tablet Take 1 tablet (150 mg total) by mouth daily.   chlorthalidone (HYGROTON) 25 MG tablet Take 1 tablet (25 mg total) by mouth daily.   Cholecalciferol 1000 units capsule Take 1 capsule (1,000 Units total) by mouth daily.   ferrous sulfate 325 (65 FE) MG tablet Take by mouth.   fluticasone (FLONASE) 50 MCG/ACT nasal spray Place 2 sprays into both nostrils daily.   glucose blood (ONE TOUCH ULTRA TEST) test strip Use as instructed   Insulin Pen Needle 32G X 4 MM MISC 1 each by Does not apply route daily.   liraglutide (VICTOZA) 18 MG/3ML SOPN PLEASE START TAKING VICTOZA 0.6MG DAILY WEEK 1, THEN TAKE 1.2MG DAILY WEEK 2, AND THEN TAKE 1.8MG DAILY THEREAFTER   losartan (COZAAR) 100 MG tablet Take 1 tablet (100 mg total) by mouth daily.   metFORMIN (GLUCOPHAGE) 1000 MG tablet Take 1 tablet (1,000 mg total) by mouth 2 (two) times daily with a meal.   ONETOUCH DELICA LANCETS 47W MISC Please use as directed.   No facility-administered encounter medications on file as of 10/16/2021.    Patient Active Problem List   Diagnosis Date Noted   Abnormal transvaginal ultrasound 10/15/2021   Need for tetanus booster 92/95/7473   Periumbilical mass 40/37/0964   Increased urinary frequency 09/07/2021   Elevated TSH 05/31/2021   Radicular pain in left arm 11/10/2020   Routine health maintenance 11/10/2020   Personal history  of colonic polyps 11/10/2020   Elevated alkaline phosphatase level 03/17/2016   Carpal tunnel syndrome 02/11/2014   Iron deficiency anemia 08/29/2012   Obesity, BMI 35-40 06/11/2012   Dyslipidemia associated with type 2 diabetes mellitus ([place]) 06/30/2007   Major depressive disorder with single episode, in remission ([place]) 06/30/2007   Essential hypertension 06/30/2007    GERD 06/30/2007   Type 2 diabetes mellitus with neurological complications ([place]) 29/56/2130    Conditions to be addressed/monitored: HTN, Anxiety, and Depression  Care Plan : CCM RN- Diabetes Type 2 (Adult), HTN, Depression/Anxiety  Updates made by [name], RN since 10/16/2021 12:00 AM     Problem: Glycemic Management (Diabetes, Type 2), HTN, Depression/Anxiety   Priority: High     Goal: Glycemic, HTN, and anxiety/depression Management Optimized   Start Date: 04/19/2020  Recent Progress: On track  Priority: High  Note:   Current Barriers: Successful outreach to patient this morning.  She had been told yesterday that her Transvaginal Ultra Sound was abnormal and she would need to follow up with a gynecologist.  Patient was upset as she was referred to a [facility] on [address] and she found out they do not have any doctors at the clinic, only midwives.  Patient would like a different GYN doctor and would like to speak with someone in charge to discuss her experience.  This RNCM notified practice administrator, Susette Racer who will be calling the patient to discuss. Knowledge Deficits related to plan of care for management of HTN and DMII  Chronic Disease Management support and education needs related to HTN and DMII   RNCM Clinical Goal(s):  Patient will verbalize understanding of plan for management of HTN and DMII as evidenced by discussions with provider and RNCM take all medications exactly as prescribed and will call provider for medication related questions as evidenced by A1C and blood pressure improvements. continue to work with RN Care Manager to address care management and care coordination needs related to  HTN and DMII as evidenced by adherence to CM Team Scheduled appointments through collaboration with RN Care manager, provider, and care team.   Interventions: 1:1 collaboration with primary care provider regarding development and update of comprehensive  plan of care as evidenced by provider attestation and co-signature Inter-disciplinary care team collaboration (see longitudinal plan of care) Evaluation of current treatment plan related to  self management and patient's adherence to plan as established by provider   Diabetes Interventions:  (Status:  Goal on track:  NO.) Long Term Goal Assessed patient's understanding of A1c goal: <7% Reviewed medications with patient and discussed importance of medication adherence Discussed plans with patient for ongoing care management follow up and provided patient with direct contact information for care management team Review of patient status, including review of consultants reports, relevant laboratory and other test results, and medications completed Lab Results  Component Value Date   HGBA1C 7.2 (A) 09/06/2021   Hypertension Interventions:  (Status:  Goal on track:  Yes.) Long Term Goal Last practice recorded BP readings:  BP Readings from Last 3 Encounters:  09/28/21 135/66  09/06/21 (!) 149/61  08/23/21 (!) 160/72  Most recent eGFR/CrCl:  Lab Results  Component Value Date   EGFR 77 09/28/2021    No components found for: CRCL  Reviewed medications with patient and discussed importance of compliance Discussed plans with patient for ongoing care management follow up and provided patient with direct contact information for care management team  Patient Goals/Self-Care Activities: Take all medications  as prescribed Attend all scheduled provider appointments Call pharmacy for medication refills 3-7 days in advance of running out of medications Call provider office for new concerns or questions   Follow Up Plan:  The patient has been provided with contact information for the care management team and has been advised to call with any health related questions or concerns.         Plan: The patient has been provided with contact information for the care management team and has been advised  to call with any health related questions or concerns.  [name], RN, BSN, CCM Care Management Coordinator [facility] Phone: [phone]: [phone]

## 2021-10-16 NOTE — Patient Instructions (Signed)
Visit Information  Thank you for taking time to visit with me today. Please don't hesitate to contact me if I can be of assistance to you before our next scheduled telephone appointment.  Please call the care guide team at [phone] if you need to cancel or reschedule your appointment.   If you are experiencing a Mental Health or [place] or need someone to talk to, please call the [place] National Suicide Prevention Lifeline: 725 028 6695 or TTY: 906-833-2670 TTY (620) [phone]) to talk to a trained counselor   The patient verbalized understanding of instructions, educational materials, and care plan provided today and declined offer to receive copy of patient instructions, educational materials, and care plan.   Telephone follow up appointment with care management team member scheduled for:To be scheduled after testing this week.  diabetic

## 2021-10-16 NOTE — Telephone Encounter (Signed)
Informed pt her novo nordisk medication (victoxa) is ready for pickup.   - Medication is labeled & ready in med room fridge. 1 box of pen needles on counter as well.  Pt said she could come by on Thursday to pickup meds and to drop re-enrollment app (including pay stubs & SS income statement).

## 2021-10-17 NOTE — Addendum Note (Signed)
Addended by: Renato Battles on: 10/17/2021 11:21 AM   Modules accepted: Orders

## 2021-10-18 ENCOUNTER — Ambulatory Visit (HOSPITAL_COMMUNITY)
Admission: RE | Admit: 2021-10-18 | Discharge: 2021-10-18 | Disposition: A | Discharge status: Home / Self Care | Payer: Medicare HMO | Source: Ambulatory Visit | Attending: Internal Medicine | Admitting: Internal Medicine

## 2021-10-18 ENCOUNTER — Other Ambulatory Visit: Payer: Self-pay

## 2021-10-18 ENCOUNTER — Telehealth: Payer: Self-pay | Admitting: Internal Medicine

## 2021-10-18 DIAGNOSIS — R1905 Periumbilic swelling, mass or lump: Secondary | ICD-10-CM | POA: Diagnosis not present

## 2021-10-18 DIAGNOSIS — K429 Umbilical hernia without obstruction or gangrene: Secondary | ICD-10-CM | POA: Diagnosis not present

## 2021-10-18 NOTE — Telephone Encounter (Signed)
Sherri Gill underwent abdominal ultrasound today which identified a small mass in the area of the umbilicus consistent with an umbilical hernia, not strangulated.  She had presented to our clinic for complaint of abdominal pain on 09-28-21 at which time the study was ordered.  I attempted to call her to report the results this evening at 5:40 PM.  No answer, no voicemail set up.  I will proceed with referral to general surgery and attempt to call later.

## 2021-10-23 ENCOUNTER — Telehealth: Payer: Self-pay

## 2021-10-23 NOTE — Telephone Encounter (Signed)
Pt is requesting a call back .. she stated that she needs to get her O2 levels check .. I explained that we are completely booked .. when asked if she is having SOB she stated that she cant explain it but she feels as if something is going on and she has been check several time for the same problem

## 2021-10-23 NOTE — Telephone Encounter (Signed)
Return pt's call. Stated the last time she was here they (the doctor) listened to her back but did not tell me anything. Stated something is going on b/c she used to walk 2 miles a day 4 x a week now just walking to her car she's sob. And talking on the phone, people tells her she's wheezing. Requesting an appt - informed no available appts this week; becoming upset, pt stated she's a pt here and should be able to see a doctor I advised pt to go to UC. Stated she might but requesting to schedule an appt for next week. Pt is requesting an appt for next Thursday. Appt schedule w/Dr Darrick Meigs on 11/01/21 @ 1015AM. Stated she will go somewhere to have her O2 checked.

## 2021-10-24 ENCOUNTER — Ambulatory Visit: Payer: Self-pay

## 2021-10-24 NOTE — Telephone Encounter (Signed)
Dr [name] Do you want Ms Langhorst added on at the 1115 AM slot on 11/02/21?

## 2021-10-24 NOTE — Chronic Care Management (AMB) (Signed)
. ° °  10/24/2021  AVIENDHA AZBELL Mar 23, 1951 [id number]     10/24/2021  LUNA AUDIA 1951/09/07 [id number]  Received message to contact patient from [facility] front desk team.  When this RNCM contacted patient, she questioned why she had never heard from anyone after her abdominal ultrasound.  Informed patient that Dr. [name] attempted to call her on 10/18/21 to share with her the results and she did not answer phone and there was no voicemail.  Shared with patient what Dr. [name] said in her note that patient was found to have an umbilical hernia and she was being referred to general surgery.  Patient also wanted to discuss the fact she thinks she has bronchitis and was not able to get an appointment in the clinic until next week.  We discussed patient going to an urgent care center if she felt she needed to be seen sooner than next week. [name], RN, BSN, CCM Care Management Coordinator [facility] Internal Medicine Phone: [phone]: [phone]

## 2021-10-25 NOTE — Telephone Encounter (Signed)
Pt was called yesterday; she will come 1/6 @ 1115 Am.

## 2021-11-01 ENCOUNTER — Encounter: Payer: Medicare HMO | Admitting: Internal Medicine

## 2021-11-02 ENCOUNTER — Ambulatory Visit (INDEPENDENT_AMBULATORY_CARE_PROVIDER_SITE_OTHER): Payer: Medicare HMO | Admitting: Internal Medicine

## 2021-11-02 ENCOUNTER — Ambulatory Visit (HOSPITAL_COMMUNITY)
Admission: RE | Admit: 2021-11-02 | Discharge: 2021-11-02 | Disposition: A | Discharge status: Home / Self Care | Payer: Medicare HMO | Source: Ambulatory Visit | Attending: Internal Medicine | Admitting: Internal Medicine

## 2021-11-02 ENCOUNTER — Other Ambulatory Visit: Payer: Self-pay

## 2021-11-02 VITALS — BP 142/75 | HR 79 | Temp 98.2°F | Ht 63.0 in | Wt 202.9 lb

## 2021-11-02 DIAGNOSIS — R059 Cough, unspecified: Secondary | ICD-10-CM | POA: Diagnosis not present

## 2021-11-02 DIAGNOSIS — R9389 Abnormal findings on diagnostic imaging of other specified body structures: Secondary | ICD-10-CM | POA: Diagnosis not present

## 2021-11-02 DIAGNOSIS — R0989 Other specified symptoms and signs involving the circulatory and respiratory systems: Secondary | ICD-10-CM | POA: Diagnosis not present

## 2021-11-02 DIAGNOSIS — K429 Umbilical hernia without obstruction or gangrene: Secondary | ICD-10-CM | POA: Diagnosis not present

## 2021-11-02 DIAGNOSIS — R918 Other nonspecific abnormal finding of lung field: Secondary | ICD-10-CM | POA: Diagnosis not present

## 2021-11-02 DIAGNOSIS — I517 Cardiomegaly: Secondary | ICD-10-CM | POA: Diagnosis not present

## 2021-11-02 NOTE — Progress Notes (Signed)
Sherri Gill is here to f/u on recent abdominal pain, which has been evaluated with an abdominal US (showed umbilical hernia) and a transvaginal US (showed widended endometrial stripe, though there has been no post-menopausal bleeding).    The anteriorly-located abdominal pain began after lifting heavy weight in mid November. The lower pelvic discomfort she had been experiencing has resolved.  SHe has been feeling poorly in general.  Not only does the abdominal pain cause difficulty with mobility and ADLs, she is also experiencing wheezing respirations and cough prod of  sputum with a rubbery (thick viscous?) consistency.  She has been feeling feverish and chilled; no objective fever.  She is worried.  Patient Active Problem List   Diagnosis Date Noted   COVID 11/13/2021   Abnormal transvaginal ultrasound 10/15/2021   Need for tetanus booster 93/57/0177   Periumbilical mass 93/90/3009   Increased urinary frequency 09/07/2021   Elevated TSH 05/31/2021   Radicular pain in left arm 11/10/2020   Routine health maintenance 11/10/2020   Personal history of colonic polyps 11/10/2020   Elevated alkaline phosphatase level 03/17/2016   Carpal tunnel syndrome 02/11/2014   Iron deficiency anemia 08/29/2012   Obesity, BMI 35-40 06/11/2012   Dyslipidemia associated with type 2 diabetes mellitus ([place]) 06/30/2007   Major depressive disorder with single episode, in remission ([place]) 06/30/2007   Essential hypertension 06/30/2007   GERD 06/30/2007   Type 2 diabetes mellitus with neurological complications ([place]) 23/30/0762      Current Outpatient Medications:    albuterol (PROVENTIL HFA;VENTOLIN HFA) 108 (90 Base) MCG/ACT inhaler, Inhale 2 puffs every 6 (six) hours as needed into the lungs for wheezing or shortness of breath., Disp: 1 Inhaler, Rfl: 0   amLODipine (NORVASC) 10 MG tablet, Take 1 tablet (10 mg total) by mouth daily., Disp: 90 tablet, Rfl: 2   atorvastatin (LIPITOR) 20 MG tablet, Take 1 tablet  (20 mg total) by mouth daily., Disp: 90 tablet, Rfl: 3   Blood Glucose Monitoring Suppl (ONE TOUCH ULTRA MINI) w/Device KIT, Please use as directed., Disp: 1 each, Rfl: 0   buPROPion (WELLBUTRIN XL) 150 MG 24 hr tablet, Take 1 tablet (150 mg total) by mouth daily., Disp: 90 tablet, Rfl: 3   chlorthalidone (HYGROTON) 25 MG tablet, Take 1 tablet by mouth once daily, Disp: 90 tablet, Rfl: 3   Cholecalciferol 1000 units capsule, Take 1 capsule (1,000 Units total) by mouth daily., Disp: 90 capsule, Rfl: 1   ferrous sulfate 325 (65 FE) MG tablet, Take by mouth., Disp: , Rfl:    fluticasone (FLONASE) 50 MCG/ACT nasal spray, Place 2 sprays into both nostrils daily., Disp: 1 g, Rfl: 2   glucose blood (ONE TOUCH ULTRA TEST) test strip, Use as instructed, Disp: 100 each, Rfl: 1   Insulin Pen Needle 32G X 4 MM MISC, 1 each by Does not apply route daily., Disp: 100 each, Rfl: 3   liraglutide (VICTOZA) 18 MG/3ML SOPN, PLEASE START TAKING VICTOZA 0.6MG DAILY WEEK 1, THEN TAKE 1.2MG DAILY WEEK 2, AND THEN TAKE 1.8MG DAILY THEREAFTER, Disp: 9 mL, Rfl: 3   losartan (COZAAR) 100 MG tablet, Take 1 tablet (100 mg total) by mouth daily., Disp: 90 tablet, Rfl: 3   metFORMIN (GLUCOPHAGE) 1000 MG tablet, Take 1 tablet (1,000 mg total) by mouth 2 (two) times daily with a meal., Disp: 180 tablet, Rfl: 3   ONETOUCH DELICA LANCETS 26J MISC, Please use as directed., Disp: 100 each, Rfl: 0  BP (!) 142/75 (BP Location: Right Arm, Cuff  Size: Small)    Pulse 79    Temp 98.2 F (36.8 C) (Oral)    Ht _0  (1.6 m)    Wt 202 lb 14.4 oz (92 kg)    SpO2 98%    BMI 35.94 kg/m  Physical exam: Calm today, though has a worried affect.  Mood is euthymic.  Forde Dandy is appropriate.  Occasionally she experiences a slightly congested sounding though non productive cough.  No audible wheeze or increased work of breathing.  Lungs with bibasilar rales, and diffuse high pitched expiratory wheeze during forced exhalation.  No JVD in supine  position, very mild LE edema which she states is new.  Heart RRR.  Abd soft, distended per her perception, with more protuberance in the R mid abd which is the area of maximal tenderness on exam.  Nml bowel sounds.  No bowel sounds heard over the area of known umbilical hernia, and this area is not tympanic to percussion.  Skin W and D, no rash.     Assessment and plan (see also problem based documentation)  Two active issues; Umbilical hernia which is symptomatic though not strangulated (refer to general surgeon), and respiratory symptoms (wheeze, cough) with rales and wheeze on exam, w/o fever or hypoxia.  CXR today.  She has rales w/o evidence of significant volume overload. Close f/u needed.  HCTZ changed to chlorthalidone recently t for better HTN control. She is very anxious (142/75 today; 135 24mago) no change in therapy at this time.

## 2021-11-02 NOTE — Patient Instructions (Signed)
[name],  Much going on.  Let's get a chest xray today, I'll call you with the results.  I'm following up on your referrals to gynecology and general surgery.  We'll get to the bottom of this!  Let's keep in touch, no longer than 3 months, though I'll be following along with your tests and referrals and we can get together earlier if needed.  Hang in there!  Dr. [name]

## 2021-11-06 DIAGNOSIS — U071 COVID-19: Secondary | ICD-10-CM | POA: Diagnosis not present

## 2021-11-06 DIAGNOSIS — Z20822 Contact with and (suspected) exposure to covid-19: Secondary | ICD-10-CM | POA: Diagnosis not present

## 2021-11-07 DIAGNOSIS — R0989 Other specified symptoms and signs involving the circulatory and respiratory systems: Secondary | ICD-10-CM | POA: Insufficient documentation

## 2021-11-08 ENCOUNTER — Other Ambulatory Visit: Payer: Self-pay | Admitting: Internal Medicine

## 2021-11-08 ENCOUNTER — Ambulatory Visit: Payer: Self-pay

## 2021-11-08 ENCOUNTER — Ambulatory Visit: Payer: Medicare HMO | Admitting: Behavioral Health

## 2021-11-08 DIAGNOSIS — K429 Umbilical hernia without obstruction or gangrene: Secondary | ICD-10-CM

## 2021-11-08 NOTE — Chronic Care Management (AMB) (Signed)
° °  11/08/2021  Sherri Gill 1951/03/07 [id number]  Returned a call from patient who shared she went to [facility] on Tuesday, 11/06/21, with her son and they both tested positive for Covid. She said she started with some symptoms on 11/04/21 and was completely sick in bed on 11/05/21.  Patient said at the [facility] clinic her oxygen saturation was in the high 90's.  Patient wanted to know when she could get a note to return to work and what she should be doing as far as isolation.  Patient says her symptoms are getting better and [facility] some at home Covid tests sent to her home.  Explained to patient the current CDC Guidelines are to isolate for 5 days and then wear a mask an additional 5 days.  Collaborated with Howell Rucks, RN and Dr. [name] and they feel patient should have a tele visit in order for her to have a note to return to work.  Message sent to Lacy Duverney at front desk to schedule phone visit. [name], RN, BSN, CCM Care Management Coordinator [facility] Internal Medicine Phone: [phone]: [phone]

## 2021-11-12 ENCOUNTER — Other Ambulatory Visit: Payer: Self-pay | Admitting: Student

## 2021-11-12 DIAGNOSIS — I10 Essential (primary) hypertension: Secondary | ICD-10-CM

## 2021-11-13 ENCOUNTER — Ambulatory Visit (INDEPENDENT_AMBULATORY_CARE_PROVIDER_SITE_OTHER): Payer: Medicare HMO | Admitting: Student

## 2021-11-13 DIAGNOSIS — U071 COVID-19: Secondary | ICD-10-CM | POA: Insufficient documentation

## 2021-11-13 NOTE — Assessment & Plan Note (Signed)
Patient tested positive for COVID on 11/06/2021.  States she and her son started having COVID symptoms and tested 5 days prior to her symptoms.  States she noticed cough, congestion, fever (does not have a home thermometer), and fatigue/shortly after her son started having symptoms.  Does note a plumber came to her home last week and was saying he was sick.  She is COVID vaccinated.  Has been having decreased p.o. intake just drinking soup from states she has been feeling sick due to lack of energy.  Also noted some episodes of diarrhea associated with this no nausea or vomiting or difficulty keeping down food.  Neighbor has been helping her out and bringing her food.  States yesterday after having more consistent meals is feeling better.  Currently making food for herself.  Does not endorse much shortness of breath, wheezing, or respiratory symptoms aside from congestion.  She has been taking Mucinex and Tylenol for her symptoms.  Given her improvement in symptoms anticipate she is slowly recovering from acute COVID illness.  Plan Continue Tylenol and Mucinex as needed for congestion, pain, fever Encouraged hydration and p.o. intake, patient feels well enough to cook for herself and friend is able to help if needed Return precautions given Follow-up in clinic as needed if symptoms or not improving Work note provided

## 2021-11-13 NOTE — Progress Notes (Signed)
Internal Medicine Clinic Attending ? ?Case discussed with Dr. Liang  At the time of the visit.  We reviewed the resident?s history and exam and pertinent patient test results.  I agree with the assessment, diagnosis, and plan of care documented in the resident?s note. ? ?

## 2021-11-13 NOTE — Progress Notes (Signed)
° °  CC: COVID/fatigue  This is a telephone encounter between Sherri Gill and Sherri Gill on 11/13/2021 for fatigue and congestion associated with recent COVID infection. The visit was conducted with the patient located at home and Sherri Gill at [facility]. The patient's identity was confirmed using their DOB and current address. The patient has consented to being evaluated through a telephone encounter and understands the associated risks (an examination cannot be done and the patient may need to come in for an appointment) / benefits (allows the patient to remain at home, decreasing exposure to coronavirus). I personally spent 21 minutes on medical discussion.   HPI:  Sherri Gill is a 71 y.o. with PMH as below.   Please see A&P for assessment of the patient's acute and chronic medical conditions.   Past Medical History:  Diagnosis Date   Chronic anemia 08/29/2012   Need colonoscopy or report. Need iron panel.    Dyslipidemia 06/30/2007   Essential hypertension, benign 06/30/2007   GERD 06/30/2007   Hx of Herpes simplex meningitis 2015   Also noted to have primary empty sella on imaging at this admission   Insomnia 06/11/2012   Major depressive disorder, recurrent episode, moderate with anxious distress ([place]) 06/30/2007   Obesity, BMI 35-40 06/11/2012   Peripheral neuropathy 2/2 T2DM 12/28/2009   Primary empty sella syndrome ([place]) 2015   Noted on imaging during hospitalization for herpes meningitis; no pituitary mass, no hormone w/u at that time, hormonally asymptomatic   Type 2 diabetes mellitus with neurological complications ([place]) 42/70/6237   Review of Systems:  Review of Systems  Constitutional:  Positive for chills, fever and malaise/fatigue.  HENT:  Positive for congestion. Negative for sore throat.   Eyes:  Negative for blurred vision.  Respiratory:  Positive for cough and sputum production. Negative for shortness of breath and wheezing.   Cardiovascular:  Negative for chest  pain.  Gastrointestinal:  Positive for diarrhea. Negative for nausea and vomiting.  Musculoskeletal:  Negative for back pain and myalgias.  Skin:  Negative for rash.  Neurological:  Positive for headaches. Negative for dizziness, sensory change and focal weakness.  Psychiatric/Behavioral:  Negative for depression and suicidal ideas.      Assessment & Plan:   See Encounters Tab for problem based charting.  Patient discussed with Dr.  Cain Sieve

## 2021-11-14 NOTE — Assessment & Plan Note (Signed)
Symptomatic, not strangulated.  Refer to general surgery.

## 2021-11-14 NOTE — Assessment & Plan Note (Signed)
Referral to GYN in progress.  No post-menopausal bleeding has occurred.

## 2021-11-19 ENCOUNTER — Ambulatory Visit: Payer: Medicare HMO

## 2021-11-19 NOTE — Chronic Care Management (AMB) (Signed)
Care Management    RN Visit Note  11/19/2021 Name: Sherri Gill MRN: [id number] DOB: [birth year]  Subjective: Sherri Gill is a 71 y.o. year old female who is a primary care patient of Sherri Pou, MD. The care management team was consulted for assistance with disease management and care coordination needs.    Engaged with patient by telephone for follow up visit in response to provider referral for case management and/or care coordination services.   Consent to Services:   Sherri Gill was given information about Care Management services today including:  Care Management services includes personalized support from designated clinical staff supervised by her physician, including individualized plan of care and coordination with other care providers 24/7 contact phone numbers for assistance for urgent and routine care needs. The patient may stop case management services at any time by phone call to the office staff.  Patient agreed to services and consent obtained.   Assessment: Review of patient past medical history, allergies, medications, health status, including review of consultants reports, laboratory and other test data, was performed as part of comprehensive evaluation and provision of chronic care management services.   SDOH (Social Determinants of Health) assessments and interventions performed:    Care Plan  Allergies  Allergen Reactions   Codeine Sulfate Nausea And Vomiting and Other (See Comments)    Dizziness   Penicillins Nausea And Vomiting   Percocet [Oxycodone-Acetaminophen] Nausea And Vomiting   Codeine Diarrhea   Lisinopril Cough    Outpatient Encounter Medications as of 11/19/2021  Medication Sig   albuterol (PROVENTIL HFA;VENTOLIN HFA) 108 (90 Base) MCG/ACT inhaler Inhale 2 puffs every 6 (six) hours as needed into the lungs for wheezing or shortness of breath.   amLODipine (NORVASC) 10 MG tablet Take 1 tablet (10 mg total) by mouth daily.    atorvastatin (LIPITOR) 20 MG tablet Take 1 tablet (20 mg total) by mouth daily.   Blood Glucose Monitoring Suppl (ONE TOUCH ULTRA MINI) w/Device KIT Please use as directed.   buPROPion (WELLBUTRIN XL) 150 MG 24 hr tablet Take 1 tablet (150 mg total) by mouth daily.   chlorthalidone (HYGROTON) 25 MG tablet Take 1 tablet by mouth once daily   Cholecalciferol 1000 units capsule Take 1 capsule (1,000 Units total) by mouth daily.   ferrous sulfate 325 (65 FE) MG tablet Take by mouth.   fluticasone (FLONASE) 50 MCG/ACT nasal spray Place 2 sprays into both nostrils daily.   glucose blood (ONE TOUCH ULTRA TEST) test strip Use as instructed   Insulin Pen Needle 32G X 4 MM MISC 1 each by Does not apply route daily.   liraglutide (VICTOZA) 18 MG/3ML SOPN PLEASE START TAKING VICTOZA 0.6MG DAILY WEEK 1, THEN TAKE 1.2MG DAILY WEEK 2, AND THEN TAKE 1.8MG DAILY THEREAFTER   losartan (COZAAR) 100 MG tablet Take 1 tablet (100 mg total) by mouth daily.   metFORMIN (GLUCOPHAGE) 1000 MG tablet Take 1 tablet (1,000 mg total) by mouth 2 (two) times daily with a meal.   ONETOUCH DELICA LANCETS 63K MISC Please use as directed.   No facility-administered encounter medications on file as of 11/19/2021.    Patient Active Problem List   Diagnosis Date Noted   Respiratory symptoms 11/07/2021   Abnormal transvaginal ultrasound 10/15/2021   Need for tetanus booster 16/10/930   Umbilical hernia 35/57/3220   Elevated TSH 05/31/2021   Radicular pain in left arm 11/10/2020   Routine health maintenance 11/10/2020   Personal history of colonic  polyps 11/10/2020   Elevated alkaline phosphatase level 03/17/2016   Carpal tunnel syndrome 02/11/2014   Iron deficiency anemia 08/29/2012   Obesity, BMI 35-40 06/11/2012   Dyslipidemia associated with type 2 diabetes mellitus ([place]) 06/30/2007   Major depressive disorder with single episode, in remission ([place]) 06/30/2007   Essential hypertension 06/30/2007   GERD 06/30/2007    Type 2 diabetes mellitus with neurological complications ([place]) 02/63/7858    Conditions to be addressed/monitored: HTN and DMII  Care Plan : CCM RN- Diabetes Type 2 (Adult), HTN, Depression/Anxiety  Updates made by Sherri Killian, RN since 11/19/2021 12:00 AM     Problem: Glycemic Management (Diabetes, Type 2), HTN, Depression/Anxiety   Priority: High     Goal: Glycemic, HTN, and anxiety/depression Management Optimized   Start Date: 04/19/2020  Recent Progress: On track  Priority: High  Note:   Current Barriers: Successful outreach to patient this afternoon.  She stated she was unable to talk and would call this RNCM back. Knowledge Deficits related to plan of care for management of HTN and DMII  Chronic Disease Management support and education needs related to HTN and DMII  RNCM Clinical Goal(s):  Patient will verbalize understanding of plan for management of HTN and DMII as evidenced by discussions with provider and RNCM take all medications exactly as prescribed and will call provider for medication related questions as evidenced by A1C and blood pressure improvements. continue to work with RN Care Manager to address care management and care coordination needs related to  HTN and DMII as evidenced by adherence to CM Team Scheduled appointments through collaboration with RN Care manager, provider, and care team.  Interventions: 1:1 collaboration with primary care provider regarding development and update of comprehensive plan of care as evidenced by provider attestation and co-signature Inter-disciplinary care team collaboration (see longitudinal plan of care) Evaluation of current treatment plan related to  self management and patient's adherence to plan as established by provider Diabetes Interventions:  (Status:  Goal on track:  NO.) Long Term Goal Assessed patient's understanding of A1c goal: <7% Reviewed medications with patient and discussed importance of medication  adherence Discussed plans with patient for ongoing care management follow up and provided patient with direct contact information for care management team Review of patient status, including review of consultants reports, relevant laboratory and other test results, and medications completed Lab Results  Component Value Date   HGBA1C 7.2 (A) 09/06/2021   Hypertension Interventions:  (Status:  Goal on track:  Yes.) Long Term Goal Last practice recorded BP readings:  BP Readings from Last 3 Encounters:  11/02/21 (!) 142/75  09/28/21 135/66  09/06/21 (!) 149/61  Most recent eGFR/CrCl:  Lab Results  Component Value Date   EGFR 77 09/28/2021    No components found for: CRCL  Reviewed medications with patient and discussed importance of compliance Discussed plans with patient for ongoing care management follow up and provided patient with direct contact information for care management team Patient Goals/Self-Care Activities: Take all medications as prescribed Attend all scheduled provider appointments Call pharmacy for medication refills 3-7 days in advance of running out of medications Call provider office for new concerns or questions  Follow Up Plan:  The patient has been provided with contact information for the care management team and has been advised to call with any health related questions or concerns.               Plan: The patient has been provided with contact information for  the care management team and has been advised to call with any health related questions or concerns.   Sherri Killian, RN, BSN, CCM Care Management Coordinator [facility] Internal Medicine Phone: [phone]: [phone]

## 2021-12-06 ENCOUNTER — Telehealth: Payer: Self-pay | Admitting: *Deleted

## 2021-12-06 ENCOUNTER — Telehealth: Payer: Medicare HMO

## 2021-12-06 ENCOUNTER — Telehealth: Payer: Self-pay

## 2021-12-06 NOTE — Telephone Encounter (Signed)
°  Care Management   Outreach Note  12/06/2021 Name: Sherri Gill MRN: [id number] DOB: [birth year]  Referred by: [name], MD Reason for referral : No chief complaint on file.   An unsuccessful telephone outreach was attempted today. The patient was referred to the case management team for assistance with care management and care coordination.   Follow Up Plan: If patient returns call to provider office, please advise to call [place]* at [phone]*.  [name], RN, BSN, CCM Care Management Coordinator [facility] Internal Medicine Phone: [phone]: [phone]

## 2021-12-06 NOTE — Chronic Care Management (AMB) (Signed)
°  Care Management   Note  12/06/2021 Name: Sherri Gill MRN: [id number] DOB: 21-Aug-1951  Sherri Gill is a 71 y.o. year old female who is a primary care patient of [name], MD and is actively engaged with the care management team. I reached out to [name] by phone today to assist with re-scheduling a follow up visit with the RN Case Manager  Follow up plan: Unsuccessful telephone outreach attempt made. The care management team will reach out to the patient again over the next 7 days. If patient returns call to provider office, please advise to call [place] at [place] Management  Direct Dial: [phone]

## 2021-12-13 ENCOUNTER — Other Ambulatory Visit: Payer: Medicare HMO

## 2021-12-14 NOTE — Chronic Care Management (AMB) (Signed)
°  Care Management   Note  12/14/2021 Name: [name] MRN: [id number] DOB: [birth year]  Sherri Gill is a 71 y.o. year old female who is a primary care patient of [name], MD and is actively engaged with the care management team. I reached out to [name] by phone today to assist with re-scheduling a follow up visit with the RN Case Manager  Follow up plan: A telephone outreach attempt made. Patient states she does not know a Briscoe Daniello and hung up. The care management team will reach out to the patient again over the next 7 days. If patient returns call to provider office, please advise to call [place] at [place] [facility]  Direct Dial: [phone]

## 2021-12-17 NOTE — Chronic Care Management (AMB) (Signed)
°  Care Management   Note  12/17/2021 Name: GERALYNN CAPRI MRN: [id number] DOB: 1951-10-25  Sherri Gill is a 71 y.o. year old female who is a primary care patient of [name], MD and is actively engaged with the care management team. I reached out to [name] by phone today to assist with re-scheduling a follow up visit with the RN Case Manager  Follow up plan: A third unsuccessful telephone outreach attempt made. Unable to make contact on outreach attempts x 3. PCP Dr. [name] notified via routed documentation in medical record. We have been unable to make contact with the patient for follow up. The care management team is available to follow up with the patient after provider conversation with the patient regarding recommendation for care management engagement and subsequent re-referral to the care management team.   [facility]  Direct Dial: [phone]

## 2021-12-28 ENCOUNTER — Encounter (HOSPITAL_COMMUNITY): Payer: Self-pay

## 2021-12-28 ENCOUNTER — Other Ambulatory Visit: Payer: Self-pay | Admitting: Surgery

## 2021-12-28 ENCOUNTER — Emergency Department (HOSPITAL_COMMUNITY): Payer: Medicare HMO

## 2021-12-28 ENCOUNTER — Observation Stay (HOSPITAL_COMMUNITY)
Admission: EM | Admit: 2021-12-28 | Discharge: 2021-12-31 | Disposition: A | Discharge status: Home Health | Payer: Medicare HMO | Attending: Internal Medicine | Admitting: Internal Medicine

## 2021-12-28 ENCOUNTER — Observation Stay (HOSPITAL_COMMUNITY): Payer: Medicare HMO

## 2021-12-28 DIAGNOSIS — R188 Other ascites: Secondary | ICD-10-CM | POA: Diagnosis not present

## 2021-12-28 DIAGNOSIS — N179 Acute kidney failure, unspecified: Secondary | ICD-10-CM | POA: Diagnosis not present

## 2021-12-28 DIAGNOSIS — R9389 Abnormal findings on diagnostic imaging of other specified body structures: Secondary | ICD-10-CM | POA: Diagnosis present

## 2021-12-28 DIAGNOSIS — Z7984 Long term (current) use of oral hypoglycemic drugs: Secondary | ICD-10-CM | POA: Insufficient documentation

## 2021-12-28 DIAGNOSIS — R1010 Upper abdominal pain, unspecified: Secondary | ICD-10-CM | POA: Diagnosis not present

## 2021-12-28 DIAGNOSIS — E871 Hypo-osmolality and hyponatremia: Secondary | ICD-10-CM | POA: Diagnosis not present

## 2021-12-28 DIAGNOSIS — R109 Unspecified abdominal pain: Secondary | ICD-10-CM | POA: Diagnosis not present

## 2021-12-28 DIAGNOSIS — Z79899 Other long term (current) drug therapy: Secondary | ICD-10-CM | POA: Insufficient documentation

## 2021-12-28 DIAGNOSIS — N8500 Endometrial hyperplasia, unspecified: Secondary | ICD-10-CM | POA: Diagnosis not present

## 2021-12-28 DIAGNOSIS — D509 Iron deficiency anemia, unspecified: Secondary | ICD-10-CM | POA: Diagnosis present

## 2021-12-28 DIAGNOSIS — Z87891 Personal history of nicotine dependence: Secondary | ICD-10-CM | POA: Diagnosis not present

## 2021-12-28 DIAGNOSIS — I10 Essential (primary) hypertension: Secondary | ICD-10-CM | POA: Diagnosis not present

## 2021-12-28 DIAGNOSIS — E1165 Type 2 diabetes mellitus with hyperglycemia: Secondary | ICD-10-CM | POA: Diagnosis not present

## 2021-12-28 DIAGNOSIS — E669 Obesity, unspecified: Secondary | ICD-10-CM | POA: Insufficient documentation

## 2021-12-28 DIAGNOSIS — D72829 Elevated white blood cell count, unspecified: Secondary | ICD-10-CM

## 2021-12-28 DIAGNOSIS — R000 Tachycardia, unspecified: Secondary | ICD-10-CM | POA: Diagnosis not present

## 2021-12-28 DIAGNOSIS — Z794 Long term (current) use of insulin: Secondary | ICD-10-CM | POA: Insufficient documentation

## 2021-12-28 DIAGNOSIS — R2689 Other abnormalities of gait and mobility: Secondary | ICD-10-CM | POA: Diagnosis not present

## 2021-12-28 DIAGNOSIS — R0600 Dyspnea, unspecified: Secondary | ICD-10-CM | POA: Diagnosis present

## 2021-12-28 DIAGNOSIS — R180 Malignant ascites: Secondary | ICD-10-CM | POA: Diagnosis present

## 2021-12-28 DIAGNOSIS — R1084 Generalized abdominal pain: Secondary | ICD-10-CM | POA: Diagnosis present

## 2021-12-28 DIAGNOSIS — E1149 Type 2 diabetes mellitus with other diabetic neurological complication: Secondary | ICD-10-CM | POA: Diagnosis present

## 2021-12-28 DIAGNOSIS — R079 Chest pain, unspecified: Secondary | ICD-10-CM | POA: Diagnosis not present

## 2021-12-28 LAB — COMPREHENSIVE METABOLIC PANEL
ALT: 10 U/L (ref 0–44)
AST: 10 U/L — ABNORMAL LOW (ref 15–41)
Albumin: 3.3 g/dL — ABNORMAL LOW (ref 3.5–5.0)
Alkaline Phosphatase: 100 U/L (ref 38–126)
Anion gap: 14 (ref 5–15)
BUN: 48 mg/dL — ABNORMAL HIGH (ref 8–23)
CO2: 26 mmol/L (ref 22–32)
Calcium: 9.2 mg/dL (ref 8.9–10.3)
Chloride: 93 mmol/L — ABNORMAL LOW (ref 98–111)
Creatinine, Ser: 1.86 mg/dL — ABNORMAL HIGH (ref 0.44–1.00)
GFR, Estimated: 29 mL/min — ABNORMAL LOW (ref >60)
Glucose, Bld: 122 mg/dL — ABNORMAL HIGH (ref 70–99)
Potassium: 3.4 mmol/L — ABNORMAL LOW (ref 3.5–5.1)
Sodium: 133 mmol/L — ABNORMAL LOW (ref 135–145)
Total Bilirubin: 0.2 mg/dL — ABNORMAL LOW (ref 0.3–1.2)
Total Protein: 7.8 g/dL (ref 6.5–8.1)

## 2021-12-28 LAB — PROTIME-INR
INR: 1.2 (ref 0.8–1.2)
Prothrombin Time: 14.8 seconds (ref 11.4–15.2)

## 2021-12-28 LAB — URINALYSIS, ROUTINE W REFLEX MICROSCOPIC
Bilirubin Urine: NEGATIVE
Glucose, UA: NEGATIVE mg/dL
Ketones, ur: 5 mg/dL — AB
Leukocytes,Ua: NEGATIVE
Nitrite: NEGATIVE
Protein, ur: NEGATIVE mg/dL
Specific Gravity, Urine: 1.014 (ref 1.005–1.030)
pH: 5 (ref 5.0–8.0)

## 2021-12-28 LAB — CBC
HCT: 33.3 % — ABNORMAL LOW (ref 36.0–46.0)
Hemoglobin: 10.4 g/dL — ABNORMAL LOW (ref 12.0–15.0)
MCH: 23.7 pg — ABNORMAL LOW (ref 26.0–34.0)
MCHC: 31.2 g/dL (ref 30.0–36.0)
MCV: 76 fL — ABNORMAL LOW (ref 80.0–100.0)
Platelets: 756 10*3/uL — ABNORMAL HIGH (ref 150–400)
RBC: 4.38 MIL/uL (ref 3.87–5.11)
RDW: 15.5 % (ref 11.5–15.5)
WBC: 15.7 10*3/uL — ABNORMAL HIGH (ref 4.0–10.5)
nRBC: 0 % (ref 0.0–0.2)

## 2021-12-28 LAB — LIPASE, BLOOD: Lipase: 30 U/L (ref 11–51)

## 2021-12-28 LAB — SODIUM, URINE, RANDOM: Sodium, Ur: 22 mmol/L

## 2021-12-28 LAB — CREATININE, URINE, RANDOM: Creatinine, Urine: 142.68 mg/dL

## 2021-12-28 MED ORDER — ONDANSETRON HCL 4 MG PO TABS: 4.0000 mg | ORAL_TABLET | Freq: Four times a day (QID) | ORAL | Status: DC | PRN

## 2021-12-28 MED ORDER — ONDANSETRON HCL 4 MG/2ML IJ SOLN: 4.0000 mg | Freq: Four times a day (QID) | INTRAMUSCULAR | Status: DC | PRN

## 2021-12-28 MED ORDER — INSULIN ASPART 100 UNIT/ML IJ SOLN
0.0000 [IU] | Freq: Three times a day (TID) | INTRAMUSCULAR | Status: DC
  Administered 2021-12-29 – 2021-12-30 (×4): 1 [IU] via SUBCUTANEOUS
  Administered 2021-12-30: 2 [IU] via SUBCUTANEOUS
  Administered 2021-12-31: 3 [IU] via SUBCUTANEOUS
  Administered 2021-12-31: 2 [IU] via SUBCUTANEOUS
  Filled 2021-12-28: qty 0.09

## 2021-12-28 MED ORDER — ACETAMINOPHEN 500 MG PO TABS
1000.0000 mg | ORAL_TABLET | Freq: Once | ORAL | Status: AC
  Administered 2021-12-28: 1000 mg via ORAL
  Filled 2021-12-28: qty 2

## 2021-12-28 MED ORDER — ACETAMINOPHEN 325 MG PO TABS
650.0000 mg | ORAL_TABLET | Freq: Four times a day (QID) | ORAL | Status: DC | PRN
  Administered 2021-12-28 – 2021-12-29 (×3): 650 mg via ORAL
  Filled 2021-12-28 (×3): qty 2

## 2021-12-28 MED ORDER — METOPROLOL TARTRATE 5 MG/5ML IV SOLN
5.0000 mg | Freq: Four times a day (QID) | INTRAVENOUS | Status: DC | PRN
  Administered 2021-12-29: 5 mg via INTRAVENOUS
  Filled 2021-12-28: qty 5

## 2021-12-28 MED ORDER — AMLODIPINE BESYLATE 10 MG PO TABS
10.0000 mg | ORAL_TABLET | Freq: Every day | ORAL | Status: DC
  Administered 2021-12-29 – 2021-12-31 (×3): 10 mg via ORAL
  Filled 2021-12-28 (×3): qty 1

## 2021-12-28 MED ORDER — ONDANSETRON 4 MG PO TBDP
4.0000 mg | ORAL_TABLET | Freq: Once | ORAL | Status: DC
  Filled 2021-12-28: qty 1

## 2021-12-28 MED ORDER — HYDROCODONE-ACETAMINOPHEN 5-325 MG PO TABS
1.0000 | ORAL_TABLET | Freq: Once | ORAL | Status: DC
  Filled 2021-12-28: qty 1

## 2021-12-28 MED ORDER — SODIUM CHLORIDE 0.9 % IV BOLUS
1000.0000 mL | Freq: Once | INTRAVENOUS | Status: AC
  Administered 2021-12-28: 1000 mL via INTRAVENOUS

## 2021-12-28 NOTE — Assessment & Plan Note (Signed)
No signs/symptoms of active infection.  Obtain chest x-ray, follow ascitic fluid labs after paracentesis, obtain urinalysis. ?

## 2021-12-28 NOTE — ED Provider Notes (Signed)
[facility] DEPT Provider Note   CSN: [id number] Arrival date & time: 12/28/21  1126     History  Chief Complaint  Patient presents with   Abdominal Pain    Sherri Gill is a 71 y.o. female.  Patient complains of swelling to her abdomen and minimal tenderness with vomiting..  Patient has a history of hypertension  The history is provided by the patient and medical records.  Abdominal Pain Pain location:  Generalized Pain quality: aching   Pain radiates to:  Does not radiate Pain severity:  Moderate Onset quality:  Sudden Timing:  Constant Progression:  Waxing and waning Chronicity:  New Context: not alcohol use   Relieved by:  Nothing Associated symptoms: no chest pain, no cough, no diarrhea, no fatigue and no hematuria       Home Medications Prior to Admission medications   Medication Sig Start Date End Date Taking? Authorizing Provider  albuterol (PROVENTIL HFA;VENTOLIN HFA) 108 (90 Base) MCG/ACT inhaler Inhale 2 puffs every 6 (six) hours as needed into the lungs for wheezing or shortness of breath. 09/01/17   Collier Salina, MD  amLODipine (NORVASC) 10 MG tablet Take 1 tablet (10 mg total) by mouth daily. 05/31/21   [name], [name], DO  atorvastatin (LIPITOR) 20 MG tablet Take 1 tablet (20 mg total) by mouth daily. 05/31/21 09/08/21  [name], [name], DO  Blood Glucose Monitoring Suppl (ONE TOUCH ULTRA MINI) w/Device KIT Please use as directed. 12/20/16   [name], MD  buPROPion (WELLBUTRIN XL) 150 MG 24 hr tablet Take 1 tablet (150 mg total) by mouth daily. 11/27/20 08/23/21  [name], MD  chlorthalidone (HYGROTON) 25 MG tablet Take 1 tablet by mouth once daily 11/13/21   [name], MD  Cholecalciferol 1000 units capsule Take 1 capsule (1,000 Units total) by mouth daily. 11/08/16   [name], MD  ferrous sulfate 325 (65 FE) MG tablet Take by mouth.    [provider]  fluticasone (FLONASE) 50  MCG/ACT nasal spray Place 2 sprays into both nostrils daily. 09/26/17   [name], [name], MD  glucose blood (ONE TOUCH ULTRA TEST) test strip Use as instructed 12/20/16   [name], MD  Insulin Pen Needle 32G X 4 MM MISC 1 each by Does not apply route daily. 09/01/19   [name], Vahini, MD  liraglutide (VICTOZA) 18 MG/3ML SOPN PLEASE START TAKING VICTOZA 0.6MG DAILY WEEK 1, THEN TAKE 1.2MG DAILY WEEK 2, AND THEN TAKE 1.8MG DAILY THEREAFTER 05/31/21   [name], [name], DO  losartan (COZAAR) 100 MG tablet Take 1 tablet (100 mg total) by mouth daily. 05/31/21   [name], [name], DO  metFORMIN (GLUCOPHAGE) 1000 MG tablet Take 1 tablet (1,000 mg total) by mouth 2 (two) times daily with a meal. 05/31/21   [name], [name], DO  ONETOUCH DELICA LANCETS 67M MISC Please use as directed. 12/20/16   [name], MD      Allergies    Codeine sulfate, Penicillins, Percocet [oxycodone-acetaminophen], Codeine, and Lisinopril    Review of Systems   Review of Systems  Constitutional:  Negative for appetite change and fatigue.  HENT:  Negative for congestion, ear discharge and sinus pressure.   Eyes:  Negative for discharge.  Respiratory:  Negative for cough.   Cardiovascular:  Negative for chest pain.  Gastrointestinal:  Positive for abdominal pain. Negative for diarrhea.  Genitourinary:  Negative for frequency and hematuria.  Musculoskeletal:  Negative for back  pain.  Skin:  Negative for rash.  Neurological:  Negative for seizures and headaches.  Psychiatric/Behavioral:  Negative for hallucinations.    Physical Exam Updated Vital Signs BP (!) 151/73    Pulse (!) 109    Temp 98 F (36.7 C) (Oral)    Resp (!) 29    SpO2 (!) 84%  Physical Exam Vitals and nursing note reviewed.  Constitutional:      Appearance: She is well-developed.  HENT:     Head: Normocephalic.     Mouth/Throat:     Mouth: Mucous membranes are moist.  Eyes:     General: No scleral icterus.    Conjunctiva/sclera: Conjunctivae normal.   Neck:     Thyroid: No thyromegaly.  Cardiovascular:     Rate and Rhythm: Normal rate and regular rhythm.     Heart sounds: No murmur heard.   No friction rub. No gallop.  Pulmonary:     Breath sounds: No stridor. No wheezing or rales.  Chest:     Chest wall: No tenderness.  Abdominal:     General: There is distension.     Tenderness: There is no abdominal tenderness. There is no rebound.  Musculoskeletal:        General: Normal range of motion.     Cervical back: Neck supple.  Lymphadenopathy:     Cervical: No cervical adenopathy.  Skin:    Findings: No erythema or rash.  Neurological:     Mental Status: She is alert and oriented to person, place, and time.     Motor: No abnormal muscle tone.     Coordination: Coordination normal.  Psychiatric:        Behavior: Behavior normal.    ED Results / Procedures / Treatments   Labs (all labs ordered are listed, but only abnormal results are displayed) Labs Reviewed  COMPREHENSIVE METABOLIC PANEL - Abnormal; Notable for the following components:      Result Value   Sodium 133 (*)    Potassium 3.4 (*)    Chloride 93 (*)    Glucose, Bld 122 (*)    BUN 48 (*)    Creatinine, Ser 1.86 (*)    Albumin 3.3 (*)    AST 10 (*)    Total Bilirubin 0.2 (*)    GFR, Estimated 29 (*)    All other components within normal limits  CBC - Abnormal; Notable for the following components:   WBC 15.7 (*)    Hemoglobin 10.4 (*)    HCT 33.3 (*)    MCV 76.0 (*)    MCH 23.7 (*)    Platelets 756 (*)    All other components within normal limits  LIPASE, BLOOD  URINALYSIS, ROUTINE W REFLEX MICROSCOPIC  PROTIME-INR    EKG None  Radiology CT ABDOMEN PELVIS WO CONTRAST  Result Date: 12/28/2021 CLINICAL DATA:  Acute abdominal pain.  Known hernia. EXAM: CT ABDOMEN AND PELVIS WITHOUT CONTRAST TECHNIQUE: Multidetector CT imaging of the abdomen and pelvis was performed following the standard protocol without IV contrast. RADIATION DOSE REDUCTION:  This exam was performed according to the departmental dose-optimization program which includes automated exposure control, adjustment of the mA and/or kV according to patient size and/or use of iterative reconstruction technique. COMPARISON:  Ultrasound abdomen 10/18/2021. FINDINGS: Lower chest: There is minimal atelectasis in the left lung base. Hepatobiliary: No focal liver abnormality is seen. No gallstones, gallbladder wall thickening, or biliary dilatation. Pancreas: Unremarkable. No pancreatic ductal dilatation or surrounding inflammatory changes. Spleen: Normal  in size without focal abnormality. Adrenals/Urinary Tract: Adrenal glands are unremarkable. Kidneys are normal, without renal calculi, focal lesion, or hydronephrosis. Bladder is unremarkable. Stomach/Bowel: Stomach is within normal limits. No evidence of bowel wall thickening, distention, or inflammatory changes. There is diffuse colonic diverticulosis without evidence for diverticulitis. The appendix is not visualized. Vascular/Lymphatic: No significant vascular findings are present. No enlarged abdominal or pelvic lymph nodes. Reproductive: Uterus and bilateral adnexa are unremarkable. There are surgical clips in the bilateral adnexa. Other: There is large volume ascites in all 4 abdominal quadrants. There is no free intraperitoneal air. There is a small umbilical hernia containing ascites. There is also some haziness of omental fat in the anterior abdomen. Musculoskeletal: No fracture is seen. No focal osseous lesion identified. IMPRESSION: 1. Large volume ascites. 2. Small umbilical hernia containing fat and ascites. 3. Haziness of anterior omentum most likely related to ascites, other etiologies such as metastatic disease can not be excluded. 4. Diffuse colonic diverticulosis without evidence for diverticulitis. Electronically Signed   By: Ronney Asters M.D.   On: 12/28/2021 15:12    Procedures Procedures    Medications Ordered in  ED Medications  ondansetron (ZOFRAN-ODT) disintegrating tablet 4 mg (has no administration in time range)  acetaminophen (TYLENOL) tablet 1,000 mg (1,000 mg Oral Given 12/28/21 1325)  sodium chloride 0.9 % bolus 1,000 mL (1,000 mLs Intravenous New Bag/Given 12/28/21 1709)    ED Course/ Medical Decision Making/ A&P                           Medical Decision Making Amount and/or Complexity of Data Reviewed Labs: ordered.  Risk Decision regarding hospitalization.  This patient presents to the ED for concern of abdominal distention, this involves an extensive number of treatment options, and is a complaint that carries with it a high risk of complications and morbidity.  The differential diagnosis includes appendicitis, diverticulitis   Co morbidities that complicate the patient evaluation  Hypertension   Additional history obtained:  Additional history obtained from patient External records from outside source obtained and reviewed including full rec   Lab Tests:  I Ordered, and personally interpreted labs.  The pertinent results include: CBC and chemistry shows elevated white count 15,000 mild AKI with creatinine 1.75 BUN 50   Imaging Studies ordered:  I ordered imaging studies including CT abdomen I independently visualized and interpreted imaging which showed significant ascites I agree with the radiologist interpretation   Cardiac Monitoring:  The patient was maintained on a cardiac monitor.  I personally viewed and interpreted the cardiac monitored which showed an underlying rhythm of: Normal sinus rhythm   Medicines ordered and prescription drug management:  I ordered medication including normal saline for Reevaluation of the patient after these medicines showed that the patient improved I have reviewed the patients home medicines and have made adjustments as needed   Test Considered:  Paracentesis   Critical Interventions:  None   Consultations  Obtained:  I requested consultation with the Dr. Collene Mares GI and hospitalist,  and discussed lab and imaging findings as well as pertinent plan - they recommend: Paracentesis tomorrow and admission to hospital   Problem List / ED Course:  Significant ascites    Social Determinants of Health:  None       CT scan shows significant ascites.  Patient also have AKI.  I spoke with Dr. Collene Mares GI and she recommends the patient be admitted to medicine and get.  Abdominal paracentesis tomorrow morning.  GI will see the patient tomorrow        Final Clinical Impression(s) / ED Diagnoses Final diagnoses:  Malignant ascites    Rx / DC Orders ED Discharge Orders     None         Milton Ferguson, MD 12/29/21 1010

## 2021-12-28 NOTE — ED Triage Notes (Addendum)
Pt arrived via POV, c/o diffuse abd pain for several days. States she was seen at [facility] for preparation for hernia surgery, states that she was told to come to ED for evaluation first.  ?

## 2021-12-28 NOTE — Assessment & Plan Note (Signed)
Patient with 1 month progressive abdominal distention.  Large volume ascites with haziness of anterior omentum seen on CT imaging.  Liver and pancreas appear unremarkable.  High suspicion for malignant ascites given recent abnormal pelvic ultrasound. ?-Obtain therapeutic and diagnostic ultrasound paracentesis ?

## 2021-12-28 NOTE — Assessment & Plan Note (Signed)
BP currently stable. ?-Continue amlodipine ?-Holding losartan and chlorthalidone as above ?

## 2021-12-28 NOTE — ED Notes (Signed)
Patient refused to put on the patient gown at this time. ?

## 2021-12-28 NOTE — H&P (Signed)
History and Physical    Sherri Gill HFW:[id number] DOB: 08/26/1951 DOA: 12/28/2021  PCP: [name], MD  Patient coming from: Home  I have personally briefly reviewed patient's old medical records in [place]  Chief Complaint: Abdominal distention  HPI: Sherri Gill is a 71 y.o. female with medical history significant for T2DM, HTN, HLD, iron deficiency anemia, umbilical hernia who presented to the ED for evaluation of abdominal distention and pain.  Patient reports 1 month of progressive abdominal distention and discomfort.  She has developed dyspnea with exertion due to her abdominal distention.  She denies any cough.  She has had chills but denies any fevers or diaphoresis.  She states that anytime she eats or drinks anything she developed some abdominal pain.  She states that food passes through her quickly but she has not really had a lot of diarrhea.  She denies any chest pain, nausea, vomiting.  She is belching frequently.  Patient went to general surgery clinic for evaluation of management of her umbilical hernia however was sent to the ED for evaluation of her abdominal distention.  Of note, patient did have an abnormal pelvic ultrasound 10/12/2021 which showed abnormal endometrial thickness with appearance of moderate complex fluid in the endometrial canal.  Ovaries were not visualized.  Patient was referred to GYN but has not yet been seen in clinic.  ED Course   Labs/Imaging on admission: I have personally reviewed following labs and imaging studies.  Initial vitals showed BP 142/87, pulse 70, RR 20, temp 98 F, SPO2 100% on room air.  Labs show sodium 133, potassium 3.4, bicarb 26, BUN 48, creatinine 1.86 (baseline around 0.8), serum glucose 122, AST 10, ALT 10, alk phos 100, total bilirubin 0.2, WBC 15.7, hemoglobin 10.4, platelets 756,000.  INR 1.2.  CT abdomen/pelvis without contrast shows large volume ascites, small umbilical hernia containing fat  and ascites, haziness of anterior omentum, diffuse colonic diverticulosis without evidence for diverticulitis.  Patient was given 1 L normal saline.  The hospitalist service was consulted to admit for further evaluation and management.  Review of Systems: All systems reviewed and are negative except as documented in history of present illness above.   Past Medical History:  Diagnosis Date   Chronic anemia 08/29/2012   Need colonoscopy or report. Need iron panel.    Dyslipidemia 06/30/2007   Essential hypertension, benign 06/30/2007   GERD 06/30/2007   Hx of Herpes simplex meningitis 2015   Also noted to have primary empty sella on imaging at this admission   Insomnia 06/11/2012   Major depressive disorder, recurrent episode, moderate with anxious distress ([facility]) 06/30/2007   Obesity, BMI 35-40 06/11/2012   Peripheral neuropathy 2/2 T2DM 12/28/2009   Primary empty sella syndrome ([place]) 2015   Noted on imaging during hospitalization for herpes meningitis; no pituitary mass, no hormone w/u at that time, hormonally asymptomatic   Type 2 diabetes mellitus with neurological complications ([facility]) 02/77/4128    Past Surgical History:  Procedure Laterality Date   COLONOSCOPY W/ POLYPECTOMY  9/05   TUBAL LIGATION      Social History:  reports that she quit smoking about 43 years ago. Her smoking use included cigarettes. She has never used smokeless tobacco. She reports that she does not drink alcohol and does not use drugs.  Allergies  Allergen Reactions   Codeine Sulfate Nausea And Vomiting and Other (See Comments)    Dizziness   Penicillins Nausea And Vomiting   Percocet [Oxycodone-Acetaminophen]  Nausea And Vomiting   Codeine Diarrhea   Lisinopril Cough    Family History  Adopted: Yes  Problem Relation Age of Onset   Dementia Mother      Prior to Admission medications   Medication Sig Start Date End Date Taking? Authorizing Provider  albuterol (PROVENTIL HFA;VENTOLIN HFA) 108 (90 Base)  MCG/ACT inhaler Inhale 2 puffs every 6 (six) hours as needed into the lungs for wheezing or shortness of breath. 09/01/17   Collier Salina, MD  amLODipine (NORVASC) 10 MG tablet Take 1 tablet (10 mg total) by mouth daily. 05/31/21   [name], [name], DO  atorvastatin (LIPITOR) 20 MG tablet Take 1 tablet (20 mg total) by mouth daily. 05/31/21 09/08/21  [name], [name], DO  Blood Glucose Monitoring Suppl (ONE TOUCH ULTRA MINI) w/Device KIT Please use as directed. 12/20/16   [name], MD  buPROPion (WELLBUTRIN XL) 150 MG 24 hr tablet Take 1 tablet (150 mg total) by mouth daily. 11/27/20 08/23/21  [name], MD  chlorthalidone (HYGROTON) 25 MG tablet Take 1 tablet by mouth once daily 11/13/21   [name], MD  Cholecalciferol 1000 units capsule Take 1 capsule (1,000 Units total) by mouth daily. 11/08/16   [name], MD  ferrous sulfate 325 (65 FE) MG tablet Take by mouth.    [provider]  fluticasone (FLONASE) 50 MCG/ACT nasal spray Place 2 sprays into both nostrils daily. 09/26/17   [name], [name], MD  glucose blood (ONE TOUCH ULTRA TEST) test strip Use as instructed 12/20/16   [name], MD  Insulin Pen Needle 32G X 4 MM MISC 1 each by Does not apply route daily. 09/01/19   [name], Vahini, MD  liraglutide (VICTOZA) 18 MG/3ML SOPN PLEASE START TAKING VICTOZA 0.6MG DAILY WEEK 1, THEN TAKE 1.2MG DAILY WEEK 2, AND THEN TAKE 1.8MG DAILY THEREAFTER 05/31/21   [name], [name], DO  losartan (COZAAR) 100 MG tablet Take 1 tablet (100 mg total) by mouth daily. 05/31/21   [name], [name], DO  metFORMIN (GLUCOPHAGE) 1000 MG tablet Take 1 tablet (1,000 mg total) by mouth 2 (two) times daily with a meal. 05/31/21   [name], [name], DO  ONETOUCH DELICA LANCETS 64P MISC Please use as directed. 12/20/16   [name], MD    Physical Exam: Vitals:   12/28/21 1715 12/28/21 1815 12/28/21 1845 12/28/21 1915  BP: 132/73 (!) 151/73 (!) 111/96 (!) 146/80  Pulse: (!) 104 (!) 109  (!) 110 (!) 106  Resp: (!) 29  (!) 25 (!) 25  Temp:      TempSrc:      SpO2: 92% (!) 84% 90% 91%   Constitutional: Resting in bed with head elevated, NAD, calm, comfortable Eyes: PERRL, lids and conjunctivae normal ENMT: Mucous membranes are moist. Posterior pharynx clear of any exudate or lesions.Normal dentition.  Neck: normal, supple, no masses. Respiratory: clear to auscultation bilaterally, no wheezing, no crackles. Normal respiratory effort. No accessory muscle use.  Cardiovascular: Regular rate and rhythm, no murmurs / rubs / gallops. No extremity edema. 2+ pedal pulses. Abdomen: Distended abdomen with epigastric tenderness.  Bowel sounds positive.  Musculoskeletal: no clubbing / cyanosis. No joint deformity upper and lower extremities. Good ROM, no contractures. Normal muscle tone.  Skin: no rashes, lesions, ulcers. No induration Neurologic: CN 2-12 grossly intact. Sensation intact. Strength 5/5 in all 4.  Psychiatric: Normal judgment and insight. Alert and oriented x 3. Normal mood.   EKG: Personally reviewed. Sinus  tachycardia, rate 106, LAE.  Rate is faster when compared to prior.  Assessment/Plan Principal Problem:   Abdominal ascites Active Problems:   AKI (acute kidney injury) ([place])   Leukocytosis   Abnormal transvaginal ultrasound   Essential hypertension   Type 2 diabetes mellitus with neurological complications (HCC)   Iron deficiency anemia   Sherri Gill is a 71 y.o. female with medical history significant for T2DM, HTN, HLD, iron deficiency anemia, umbilical hernia who is admitted for evaluation of abdominal ascites and AKI.  Assessment and Plan: * Abdominal ascites Patient with 1 month progressive abdominal distention.  Large volume ascites with haziness of anterior omentum seen on CT imaging.  Liver and pancreas appear unremarkable.  High suspicion for malignant ascites given recent abnormal pelvic ultrasound. -Obtain therapeutic and diagnostic ultrasound  paracentesis  AKI (acute kidney injury) ([place]) Creatinine 1.86 on admission compared to baseline around 0.8.  Likely related to volume ascites.  CT shows normal-appearing kidneys without renal calculi, focal lesion, hydronephrosis.  No changes of hepatic cirrhosis on CT. -Hold home losartan, metformin, chlorthalidone -Follow urinalysis -Monitor urine output -Paracentesis as above -Repeat labs in a.m.  Leukocytosis No signs/symptoms of active infection.  Obtain chest x-ray, follow ascitic fluid labs after paracentesis, obtain urinalysis.  Abnormal transvaginal ultrasound Pelvic ultrasound 10/12/2021 showed abnormal heterogeneous endometrial thickening with appearance of moderate complex fluid in the endometrial canal.  Ovaries were not visualized.  Concerning for ovarian or endometrial malignancy as cause of her abdominal ascites. -Paracentesis as above -Repeat pelvic ultrasound  Essential hypertension BP currently stable. -Continue amlodipine -Holding losartan and chlorthalidone as above  Type 2 diabetes mellitus with neurological complications (HCC) Hold metformin and Victoza.  Placed on SSI.  Iron deficiency anemia Hemoglobin stable.  Overdue for colonoscopy, last one was in 1994 per prior PCP documentation.  DVT prophylaxis: SCDs Start: 12/28/21 1936 Code Status: Full code, confirmed with patient on admission Family Communication: Patient's friend at bedside. Disposition Plan: From home, dispo pending clinical progress. Consults called: GI Severity of Illness: The appropriate patient status for this patient is OBSERVATION. Observation status is judged to be reasonable and necessary in order to provide the required intensity of service to ensure the patient's safety. The patient's presenting symptoms, physical exam findings, and initial radiographic and laboratory data in the context of their medical condition is felt to place them at decreased risk for further clinical  deterioration. Furthermore, it is anticipated that the patient will be medically stable for discharge from the hospital within 2 midnights of admission.   Zada Finders MD [facility]  If 7PM-7AM, please contact night-coverage [email or web address]  12/28/2021, 8:12 PM

## 2021-12-28 NOTE — Hospital Course (Signed)
Sherri Gill is a 71 y.o. female with medical history significant for T2DM, HTN, HLD, iron deficiency anemia, umbilical hernia who is admitted for evaluation of abdominal ascites and AKI. ?

## 2021-12-28 NOTE — ED Notes (Signed)
Pt had a run of sinus tach at 155 lasted 2 min ?

## 2021-12-28 NOTE — Assessment & Plan Note (Signed)
Pelvic ultrasound 10/12/2021 showed abnormal heterogeneous endometrial thickening with appearance of moderate complex fluid in the endometrial canal.  Ovaries were not visualized.  Concerning for ovarian or endometrial malignancy as cause of her abdominal ascites. ?-Paracentesis as above ?-Repeat pelvic ultrasound ?

## 2021-12-28 NOTE — ED Provider Triage Note (Signed)
Emergency Medicine Provider Triage Evaluation Note ? ?Sherri Gill , a 71 y.o. female  was evaluated in triage.  Pt complains of abdominal pain, bloating, known hernia, decreased appetite, nausea.  Patient reports that she is being seen and evaluated by surgery center for hernia repair, but she is having diffuse abdominal pain, decreased appetite they are concerned about possible incarceration, strangulation, or other intra-abdominal abnormality and sent her to the emergency department to be evaluated first.  Sensation of tightness at rest, but significant pain when pressing on the abdomen.  Last bowel movement this morning, patient having a lot of belching, she endorses she will ligation, no longer has menstrual period.  She does not have any other intra-abdominal surgeries.  She endorses a history of diabetes, high cholesterol, hypertension.. ? ?Review of Systems  ?Positive: As above ?Negative: As above ? ?Physical Exam  ?BP (!) 142/87 (BP Location: Left Arm)   Pulse 70   Temp 98 ?F (36.7 ?C) (Oral)   Resp 20   SpO2 100%  ?Gen:   Awake, no distress   ?Resp:  Normal effort  ?MSK:   Moves extremities without difficulty  ?Other:  Distended taut abdomen with TTP in periumbilical region. Palpable hernia, no skin changes to suggest acute incarceration or strangulation at this time ? ?Medical Decision Making  ?Medically screening exam initiated at 12:22 PM.  Appropriate orders placed.  ADAJAH COCKING was informed that the remainder of the evaluation will be completed by another provider, this initial triage assessment does not replace that evaluation, and the importance of remaining in the ED until their evaluation is complete. ? ?Workup initiated ?  ?Anselmo Pickler, PA-C ?12/28/21 1224 ? ?

## 2021-12-28 NOTE — Assessment & Plan Note (Addendum)
Creatinine 1.86 on admission compared to baseline around 0.8.  Likely related to volume ascites.  CT shows normal-appearing kidneys without renal calculi, focal lesion, hydronephrosis.  No changes of hepatic cirrhosis on CT. ?-Hold home losartan, metformin, chlorthalidone ?-Follow urinalysis ?-Monitor urine output ?-Paracentesis as above ?-Repeat labs in a.m. ?

## 2021-12-28 NOTE — Assessment & Plan Note (Signed)
Hold metformin and Victoza.  Placed on SSI. ?

## 2021-12-28 NOTE — Assessment & Plan Note (Signed)
Hemoglobin stable.  Overdue for colonoscopy, last one was in 1994 per prior PCP documentation. ?

## 2021-12-29 ENCOUNTER — Observation Stay (HOSPITAL_COMMUNITY): Payer: Medicare HMO

## 2021-12-29 ENCOUNTER — Other Ambulatory Visit: Payer: Self-pay

## 2021-12-29 DIAGNOSIS — N179 Acute kidney failure, unspecified: Secondary | ICD-10-CM | POA: Diagnosis not present

## 2021-12-29 DIAGNOSIS — R188 Other ascites: Secondary | ICD-10-CM | POA: Diagnosis not present

## 2021-12-29 DIAGNOSIS — R180 Malignant ascites: Secondary | ICD-10-CM | POA: Diagnosis not present

## 2021-12-29 DIAGNOSIS — K219 Gastro-esophageal reflux disease without esophagitis: Secondary | ICD-10-CM | POA: Diagnosis not present

## 2021-12-29 DIAGNOSIS — D509 Iron deficiency anemia, unspecified: Secondary | ICD-10-CM | POA: Diagnosis not present

## 2021-12-29 DIAGNOSIS — C801 Malignant (primary) neoplasm, unspecified: Secondary | ICD-10-CM | POA: Diagnosis not present

## 2021-12-29 DIAGNOSIS — I10 Essential (primary) hypertension: Secondary | ICD-10-CM | POA: Diagnosis not present

## 2021-12-29 DIAGNOSIS — E1149 Type 2 diabetes mellitus with other diabetic neurological complication: Secondary | ICD-10-CM | POA: Diagnosis not present

## 2021-12-29 DIAGNOSIS — K5730 Diverticulosis of large intestine without perforation or abscess without bleeding: Secondary | ICD-10-CM | POA: Diagnosis not present

## 2021-12-29 LAB — COMPREHENSIVE METABOLIC PANEL
ALT: 11 U/L (ref 0–44)
AST: 12 U/L — ABNORMAL LOW (ref 15–41)
Albumin: 3.1 g/dL — ABNORMAL LOW (ref 3.5–5.0)
Alkaline Phosphatase: 114 U/L (ref 38–126)
Anion gap: 13 (ref 5–15)
BUN: 50 mg/dL — ABNORMAL HIGH (ref 8–23)
CO2: 25 mmol/L (ref 22–32)
Calcium: 9.3 mg/dL (ref 8.9–10.3)
Chloride: 95 mmol/L — ABNORMAL LOW (ref 98–111)
Creatinine, Ser: 1.75 mg/dL — ABNORMAL HIGH (ref 0.44–1.00)
GFR, Estimated: 31 mL/min — ABNORMAL LOW (ref >60)
Glucose, Bld: 136 mg/dL — ABNORMAL HIGH (ref 70–99)
Potassium: 3.5 mmol/L (ref 3.5–5.1)
Sodium: 133 mmol/L — ABNORMAL LOW (ref 135–145)
Total Bilirubin: 0.5 mg/dL (ref 0.3–1.2)
Total Protein: 7.7 g/dL (ref 6.5–8.1)

## 2021-12-29 LAB — CBC
HCT: 32.6 % — ABNORMAL LOW (ref 36.0–46.0)
Hemoglobin: 10.4 g/dL — ABNORMAL LOW (ref 12.0–15.0)
MCH: 24 pg — ABNORMAL LOW (ref 26.0–34.0)
MCHC: 31.9 g/dL (ref 30.0–36.0)
MCV: 75.1 fL — ABNORMAL LOW (ref 80.0–100.0)
Platelets: 787 10*3/uL — ABNORMAL HIGH (ref 150–400)
RBC: 4.34 MIL/uL (ref 3.87–5.11)
RDW: 15.6 % — ABNORMAL HIGH (ref 11.5–15.5)
WBC: 15.3 10*3/uL — ABNORMAL HIGH (ref 4.0–10.5)
nRBC: 0 % (ref 0.0–0.2)

## 2021-12-29 LAB — GRAM STAIN

## 2021-12-29 LAB — BODY FLUID CELL COUNT WITH DIFFERENTIAL
Lymphs, Fluid: 30 %
Monocyte-Macrophage-Serous Fluid: 37 % — ABNORMAL LOW (ref 50–90)
Neutrophil Count, Fluid: 33 % — ABNORMAL HIGH (ref 0–25)
Total Nucleated Cell Count, Fluid: 1473 cu mm — ABNORMAL HIGH (ref 0–1000)

## 2021-12-29 LAB — GLUCOSE, CAPILLARY
Glucose-Capillary: 125 mg/dL — ABNORMAL HIGH (ref 70–99)
Glucose-Capillary: 110 mg/dL — ABNORMAL HIGH (ref 70–99)

## 2021-12-29 LAB — LACTATE DEHYDROGENASE, PLEURAL OR PERITONEAL FLUID: LD, Fluid: 537 U/L — ABNORMAL HIGH (ref 3–23)

## 2021-12-29 LAB — PROTIME-INR
INR: 1.2 (ref 0.8–1.2)
Prothrombin Time: 15.4 seconds — ABNORMAL HIGH (ref 11.4–15.2)

## 2021-12-29 LAB — ALBUMIN, PLEURAL OR PERITONEAL FLUID: Albumin, Fluid: 2.9 g/dL

## 2021-12-29 LAB — GLUCOSE, PLEURAL OR PERITONEAL FLUID: Glucose, Fluid: 104 mg/dL

## 2021-12-29 LAB — HIV ANTIBODY (ROUTINE TESTING W REFLEX): HIV Screen 4th Generation wRfx: NONREACTIVE

## 2021-12-29 LAB — PROTEIN, PLEURAL OR PERITONEAL FLUID: Total protein, fluid: 5.9 g/dL

## 2021-12-29 LAB — LACTATE DEHYDROGENASE: LDH: 122 U/L (ref 98–192)

## 2021-12-29 LAB — AMYLASE: Amylase: 34 U/L (ref 28–100)

## 2021-12-29 MED ORDER — TRAMADOL HCL 50 MG PO TABS
50.0000 mg | ORAL_TABLET | Freq: Four times a day (QID) | ORAL | Status: DC | PRN
  Administered 2021-12-29 – 2021-12-30 (×2): 50 mg via ORAL
  Filled 2021-12-29 (×2): qty 1

## 2021-12-29 MED ORDER — HYOSCYAMINE SULFATE 0.125 MG SL SUBL
0.2500 mg | SUBLINGUAL_TABLET | Freq: Once | SUBLINGUAL | Status: AC
  Administered 2021-12-29: 0.25 mg via SUBLINGUAL
  Filled 2021-12-29: qty 2

## 2021-12-29 MED ORDER — ATORVASTATIN CALCIUM 20 MG PO TABS
20.0000 mg | ORAL_TABLET | Freq: Every day | ORAL | Status: DC
  Administered 2021-12-29 – 2021-12-31 (×3): 20 mg via ORAL
  Filled 2021-12-29 (×3): qty 1

## 2021-12-29 MED ORDER — LACTATED RINGERS IV SOLN: INTRAVENOUS | Status: AC

## 2021-12-29 MED ORDER — LIDOCAINE HCL 1 % IJ SOLN
INTRAMUSCULAR | Status: AC
Start: 2021-12-29 — End: 2021-12-29
  Administered 2021-12-29: 10 mL
  Filled 2021-12-29: qty 20

## 2021-12-29 MED ORDER — LOSARTAN POTASSIUM 50 MG PO TABS: 100.0000 mg | ORAL_TABLET | Freq: Every day | ORAL | Status: DC

## 2021-12-29 NOTE — Plan of Care (Signed)
?  Problem: Elimination: ?Goal: Will not experience complications related to urinary retention ?Outcome: Progressing ?  ?

## 2021-12-29 NOTE — ED Notes (Signed)
Pt care taken, would like something to eat, no other complaints at this time ?

## 2021-12-29 NOTE — Consult Note (Signed)
[facility] Reason for Consult:Ascies on CT scan. Referring Physician: ER MD  Sherri Gill is an 71 y.o. female.  HPI: Sherri Gill is a 71 year old black female, with multiple medical problems listed below, who gives a history of diffuse abdominal pain that seems to have started after Christmas last year. She went to see a Psychologist, sport and exercise for a hernia repair; the surgeon sent her to the ER for further evaluation of ascites. She denies any previous history of liver disease. She has not had a colonoscopy in over 10 years; she reportedly had a colonoscopy done by Dr. Earlean Gill ver 10 years ago. She had a pelvic ultrasound done in December 2022 that revealed heterogenous endometrial thickening and endometrial sampling was advised to rule out endometrial cancer.CT scan done on admission revealed large volume ascites with a small umbilicl hernia and haziness of the anterior omentum ?metastatic disease; diffuse colonic diverticulosis with diverticulitis was also noted. She has been worsening abdominal pain requiring her to use a heating pad for symptomatic relief. She feels much better after having a paracentesis today. 2.8 litres of ascitic fluid were removed. A repeat pelvic ultrasound done yesterday revealed a thickened endometrium rule out endometrial cancer. She denies having any problems with constipation or diarrhea, melena or hematochezia. Her LFT's are normal and there is no evidence of cirrhosis or varices on the CT scan.    Past Medical History:  Diagnosis Date   Chronic anemia 08/29/2012   Need colonoscopy or report. Need iron panel.    Dyslipidemia 06/30/2007   Essential hypertension, benign 06/30/2007   GERD 06/30/2007   Hx of Herpes simplex meningitis 2015   Also noted to have primary empty sella on imaging at this admission   Insomnia 06/11/2012   Major depressive disorder, recurrent episode, moderate with anxious distress ([place]) 06/30/2007   Obesity, BMI 35-40 06/11/2012   Peripheral neuropathy  2/2 T2DM 12/28/2009   Primary empty sella syndrome ([place]) 2015   Noted on imaging during hospitalization for herpes meningitis; no pituitary mass, no hormone w/u at that time, hormonally asymptomatic   Type 2 diabetes mellitus with neurological complications ([place]) 61/95/0932   Past Surgical History:  Procedure Laterality Date   COLONOSCOPY W/ POLYPECTOMY  9/05   TUBAL LIGATION     Family History  Adopted: Yes  Problem Relation Age of Onset   Dementia Mother    Social History:  reports that she quit smoking about 43 years ago. Her smoking use included cigarettes. She has never used smokeless tobacco. She reports that she does not drink alcohol and does not use drugs.  Allergies:  Allergies  Allergen Reactions   Codeine Sulfate Nausea And Vomiting and Other (See Comments)    Dizziness   Penicillins Nausea And Vomiting   Percocet [Oxycodone-Acetaminophen] Nausea And Vomiting   Codeine Diarrhea   Lisinopril Cough   Medications: I have reviewed the patient's current medications. Prior to Admission:  Medications Prior to Admission  Medication Sig Dispense Refill Last Dose   amLODipine (NORVASC) 10 MG tablet Take 1 tablet (10 mg total) by mouth daily. (Patient taking differently: Take 10 mg by mouth at bedtime.) 90 tablet 2 12/26/2021   atorvastatin (LIPITOR) 20 MG tablet Take 1 tablet (20 mg total) by mouth daily. 90 tablet 3 12/26/2021   chlorthalidone (HYGROTON) 25 MG tablet Take 1 tablet by mouth once daily (Patient taking differently: Take 25 mg by mouth at bedtime.) 90 tablet 3 12/26/2021   Cholecalciferol 1000 units capsule Take 1  capsule (1,000 Units total) by mouth daily. 90 capsule 1 12/24/2021   dorzolamide-timolol (COSOPT) 22.3-6.8 MG/ML ophthalmic solution Place 1 drop into both eyes 2 (two) times daily.   12/27/2021   ferrous sulfate 325 (65 FE) MG tablet Take 325 mg by mouth daily with breakfast.   12/24/2021   latanoprost (XALATAN) 0.005 % ophthalmic solution Place 1 drop into both  eyes at bedtime.   12/26/2021   liraglutide (VICTOZA) 18 MG/3ML SOPN PLEASE START TAKING VICTOZA 0.6MG DAILY WEEK 1, THEN TAKE 1.2MG DAILY WEEK 2, AND THEN TAKE 1.8MG DAILY THEREAFTER (Patient taking differently: Inject 1.2 mg into the skin daily.) 9 mL 3 12/26/2021   losartan (COZAAR) 100 MG tablet Take 1 tablet (100 mg total) by mouth daily. (Patient taking differently: Take 100 mg by mouth at bedtime.) 90 tablet 3 12/26/2021   metFORMIN (GLUCOPHAGE) 1000 MG tablet Take 1 tablet (1,000 mg total) by mouth 2 (two) times daily with a meal. 180 tablet 3 12/26/2021   Blood Glucose Monitoring Suppl (ONE TOUCH ULTRA MINI) w/Device KIT Please use as directed. 1 each 0    glucose blood (ONE TOUCH ULTRA TEST) test strip Use as instructed 100 each 1    Insulin Pen Needle 32G X 4 MM MISC 1 each by Does not apply route daily. 100 each 3    ONETOUCH DELICA LANCETS 99J MISC Please use as directed. 100 each 0    Scheduled:  amLODipine  10 mg Oral Daily   atorvastatin  20 mg Oral Daily   insulin aspart  0-9 Units Subcutaneous TID WC   Continuous:  lactated ringers     TTS:[id number], metoprolol tartrate, ondansetron **OR** ondansetron (ZOFRAN) IV, traMADol  Results for orders placed or performed during the hospital encounter of 12/28/21 (from the past 48 hour(s))  Lipase, blood     Status: None   Collection Time: 12/28/21 12:22 PM  Result Value Ref [place]   Lipase 30 11 - 51 U/L    Comment: Performed at [facility], [place] [address] [place], [place] [address]  Comprehensive metabolic panel     Status: Abnormal   Collection Time: 12/28/21 12:22 PM  Result Value Ref [place]   Sodium 133 (L) 135 - 145 mmol/L   Potassium 3.4 (L) 3.5 - 5.1 mmol/L   Chloride 93 (L) 98 - 111 mmol/L   CO2 26 22 - 32 mmol/L   Glucose, Bld 122 (H) 70 - 99 mg/dL    Comment: Glucose reference range applies only to samples taken after fasting for at least 8 hours.   BUN 48 (H) 8 - 23 mg/dL   Creatinine, Ser 1.86  (H) 0.44 - 1.00 mg/dL   Calcium 9.2 8.9 - 10.3 mg/dL   Total Protein 7.8 6.5 - 8.1 g/dL   Albumin 3.3 (L) 3.5 - 5.0 g/dL   AST 10 (L) 15 - 41 U/L   ALT 10 0 - 44 U/L   Alkaline Phosphatase 100 38 - 126 U/L   Total Bilirubin 0.2 (L) 0.3 - 1.2 mg/dL   GFR, Estimated 29 (L) >60 mL/min    Comment: (NOTE) Calculated using the CKD-EPI Creatinine Equation (2021)    Anion gap 14 5 - 15    Comment: Performed at [facility], [place] [address], [place], [place] [address]  CBC     Status: Abnormal   Collection Time: 12/28/21 12:22 PM  Result Value Ref [place]   WBC 15.7 (H) 4.0 - 10.5 K/uL   RBC  4.38 3.87 - 5.11 MIL/uL   Hemoglobin 10.4 (L) 12.0 - 15.0 g/dL   HCT 33.3 (L) 36.0 - 46.0 %   MCV 76.0 (L) 80.0 - 100.0 fL   MCH 23.7 (L) 26.0 - 34.0 pg   MCHC 31.2 30.0 - 36.0 g/dL   RDW 15.5 11.5 - 15.5 %   Platelets 756 (H) 150 - 400 K/uL   nRBC 0.0 0.0 - 0.2 %    Comment: Performed at [facility], [place] [address], [place], [place] [address]  Protime-INR     Status: None   Collection Time: 12/28/21  6:06 PM  Result Value Ref [place]   Prothrombin Time 14.8 11.4 - 15.2 seconds   INR 1.2 0.8 - 1.2    Comment: (NOTE) INR goal varies based on device and disease states. Performed at [facility], [place] [address], [place], [place] [address]   Sodium, urine, random     Status: None   Collection Time: 12/28/21  7:59 PM  Result Value Ref [place]   Sodium, Ur 22 mmol/L    Comment: Performed at [facility], [place] [address] [place], [place] [address]  Creatinine, urine, random     Status: None   Collection Time: 12/28/21  7:59 PM  Result Value Ref [place]   Creatinine, Urine 142.68 mg/dL    Comment: Performed at [facility], [place] [address] [place], [place] [address]  Urinalysis, Routine w reflex microscopic Urine, Clean Catch     Status: Abnormal   Collection Time: 12/28/21  8:25 PM  Result Value Ref [place]    Color, Urine YELLOW YELLOW   APPearance HAZY (A) CLEAR   Specific Gravity, Urine 1.014 1.005 - 1.030   pH 5.0 5.0 - 8.0   Glucose, UA NEGATIVE NEGATIVE mg/dL   Hgb urine dipstick SMALL (A) NEGATIVE   Bilirubin Urine NEGATIVE NEGATIVE   Ketones, ur 5 (A) NEGATIVE mg/dL   Protein, ur NEGATIVE NEGATIVE mg/dL   Nitrite NEGATIVE NEGATIVE   Leukocytes,Ua NEGATIVE NEGATIVE   RBC / HPF 0-5 0 - 5 RBC/hpf   WBC, UA 11-20 0 - 5 WBC/hpf   Bacteria, UA RARE (A) NONE SEEN   Squamous Epithelial / LPF 0-5 0 - 5   Mucus PRESENT    Hyaline Casts, UA PRESENT     Comment: Performed at [facility], [place] [address] [place], [place] [address]  CBC     Status: Abnormal   Collection Time: 12/29/21  5:40 AM  Result Value Ref [place]   WBC 15.3 (H) 4.0 - 10.5 K/uL   RBC 4.34 3.87 - 5.11 MIL/uL   Hemoglobin 10.4 (L) 12.0 - 15.0 g/dL   HCT 32.6 (L) 36.0 - 46.0 %   MCV 75.1 (L) 80.0 - 100.0 fL   MCH 24.0 (L) 26.0 - 34.0 pg   MCHC 31.9 30.0 - 36.0 g/dL   RDW 15.6 (H) 11.5 - 15.5 %   Platelets 787 (H) 150 - 400 K/uL   nRBC 0.0 0.0 - 0.2 %    Comment: Performed at [facility], [place] [address], [place],  [address]  Comprehensive metabolic panel     Status: Abnormal   Collection Time: 12/29/21  5:40 AM  Result Value Ref [place]   Sodium 133 (L) 135 - 145 mmol/L   Potassium 3.5 3.5 - 5.1 mmol/L   Chloride 95 (L) 98 - 111 mmol/L   CO2 25 22 - 32 mmol/L  Glucose, Bld 136 (H) 70 - 99 mg/dL    Comment: Glucose reference range applies only to samples taken after fasting for at least 8 hours.   BUN 50 (H) 8 - 23 mg/dL   Creatinine, Ser 1.75 (H) 0.44 - 1.00 mg/dL   Calcium 9.3 8.9 - 10.3 mg/dL   Total Protein 7.7 6.5 - 8.1 g/dL   Albumin 3.1 (L) 3.5 - 5.0 g/dL   AST 12 (L) 15 - 41 U/L   ALT 11 0 - 44 U/L   Alkaline Phosphatase 114 38 - 126 U/L   Total Bilirubin 0.5 0.3 - 1.2 mg/dL   GFR, Estimated 31 (L) >60 mL/min    Comment: (NOTE) Calculated using the CKD-EPI  Creatinine Equation (2021)    Anion gap 13 5 - 15    Comment: Performed at [facility], [place] [address] [place], [place] [address]  Protime-INR     Status: Abnormal   Collection Time: 12/29/21  5:40 AM  Result Value Ref [place]   Prothrombin Time 15.4 (H) 11.4 - 15.2 seconds   INR 1.2 0.8 - 1.2    Comment: (NOTE) INR goal varies based on device and disease states. Performed at [facility], [place] [address] [place], [place] [address]     CT ABDOMEN PELVIS WO CONTRAST  Result Date: 12/28/2021 CLINICAL DATA:  Acute abdominal pain.  Known hernia. EXAM: CT ABDOMEN AND PELVIS WITHOUT CONTRAST TECHNIQUE: Multidetector CT imaging of the abdomen and pelvis was performed following the standard protocol without IV contrast. RADIATION DOSE REDUCTION: This exam was performed according to the departmental dose-optimization program which includes automated exposure control, adjustment of the mA and/or kV according to patient size and/or use of iterative reconstruction technique. COMPARISON:  Ultrasound abdomen 10/18/2021. FINDINGS: Lower chest: There is minimal atelectasis in the left lung base. Hepatobiliary: No focal liver abnormality is seen. No gallstones, gallbladder wall thickening, or biliary dilatation. Pancreas: Unremarkable. No pancreatic ductal dilatation or surrounding inflammatory changes. Spleen: Normal in size without focal abnormality. Adrenals/Urinary Tract: Adrenal glands are unremarkable. Kidneys are normal, without renal calculi, focal lesion, or hydronephrosis. Bladder is unremarkable. Stomach/Bowel: Stomach is within normal limits. No evidence of bowel wall thickening, distention, or inflammatory changes. There is diffuse colonic diverticulosis without evidence for diverticulitis. The appendix is not visualized. Vascular/Lymphatic: No significant vascular findings are present. No enlarged abdominal or pelvic lymph nodes. Reproductive: Uterus and bilateral  adnexa are unremarkable. There are surgical clips in the bilateral adnexa. Other: There is large volume ascites in all 4 abdominal quadrants. There is no free intraperitoneal air. There is a small umbilical hernia containing ascites. There is also some haziness of omental fat in the anterior abdomen. Musculoskeletal: No fracture is seen. No focal osseous lesion identified. IMPRESSION: 1. Large volume ascites. 2. Small umbilical hernia containing fat and ascites. 3. Haziness of anterior omentum most likely related to ascites, other etiologies such as metastatic disease can not be excluded. 4. Diffuse colonic diverticulosis without evidence for diverticulitis. Electronically Signed   By: Ronney Asters M.D.   On: 12/28/2021 15:12   DG Chest 2 View  Result Date: 12/28/2021 CLINICAL DATA:  Chest pain EXAM: CHEST - 2 VIEW COMPARISON:  11/02/2021 FINDINGS: Cardiac shadow is within normal limits. Mild vascular prominence is noted with stable vessel on end in the left mid lung. Lungs are well aerated bilaterally. No focal infiltrate or sizable effusion is seen. Degenerative changes of the thoracic spine are noted. IMPRESSION: No acute abnormality  noted. Electronically Signed   By: Inez Catalina M.D.   On: 12/28/2021 20:21   US PELVIC COMPLETE WITH TRANSVAGINAL  Result Date: 12/28/2021 CLINICAL DATA:  71 year old with endometrial thickening on ultrasound. Abdominal ascites. EXAM: TRANSABDOMINAL AND TRANSVAGINAL ULTRASOUND OF PELVIS TECHNIQUE: Both transabdominal and transvaginal ultrasound examinations of the pelvis were performed. Transabdominal technique was performed for global imaging of the pelvis including uterus, ovaries, adnexal regions, and pelvic cul-de-sac. It was necessary to proceed with endovaginal exam following the transabdominal exam to visualize the uterus and endometrium. COMPARISON:  Pelvic ultrasound 10/12/2021. Abdominopelvic CT earlier today. FINDINGS: Uterus Measurements: 7.9 x 5 x 5.2 cm =  volume: 107 mL. The uterus is anteverted. No fibroids or other mass visualized. Endometrium Thickness: 22-36 mm, heterogeneous and complex. Probable endometrial fluid. There is some endometrial vascularity. Right ovary Not visualized. Left ovary Not visualized. Other findings Pelvic ascites, also seen on recent CT. IMPRESSION: 1. Persistent abnormal appearance of the endometrium, heterogeneously thickened and complex with possible intraluminal fluid. This is similar to that seen on December 2022 ultrasound. Endometrial thickness is considered abnormal for an asymptomatic post-menopausal female. Endometrial sampling should be considered to exclude carcinoma. 2. Nonvisualization of the ovaries. Electronically Signed   By: Keith Rake M.D.   On: 12/28/2021 21:02    Review of Systems  Constitutional:  Positive for appetite change and fatigue. Negative for activity change, chills, diaphoresis and fever.  HENT: Negative.    Respiratory: Negative.    Endocrine: Negative.   Genitourinary:  Negative for enuresis.  Skin: Negative.   Psychiatric/Behavioral:  Positive for agitation. The patient is nervous/anxious.   Blood pressure 138/73, pulse (!) 108, temperature 97.9 F (36.6 C), resp. rate 19, SpO2 98 %. Physical Exam Constitutional:      General: She is not in acute distress.    Appearance: She is well-developed. She is not ill-appearing.  HENT:     Head: Normocephalic and atraumatic.     Mouth/Throat:     Mouth: Mucous membranes are moist.     Pharynx: Oropharynx is clear.  Eyes:     Extraocular Movements: Extraocular movements intact.     Pupils: Pupils are equal, round, and reactive to light.  Cardiovascular:     Rate and Rhythm: Normal rate and regular rhythm.  Pulmonary:     Effort: Pulmonary effort is normal.     Breath sounds: Normal breath sounds.  Abdominal:     General: Abdomen is protuberant. Bowel sounds are normal. There is distension.     Hernia: A hernia is present. Hernia  is present in the umbilical area.  Skin:    General: Skin is warm and dry.  Neurological:     General: No focal deficit present.     Mental Status: She is alert and oriented to person, place, and time.  Psychiatric:        Mood and Affect: Mood is anxious and depressed.   Assessment/Plan: 1) Ascites with heterogenous thickening of the endometrium and haziness of the omentum-my suspicion is that the ascites is due to endometrial cancer but we will wait to see the results of the ascitic fluid and have a gynecology consult arranged for her so that an endometrial biopsy can be done to establish a diagnosis. 2) GERD. 3) Colonic diverticulosis. 4) Major depressive diorder. 5) HTN/Hyperlipidemia. 6) AODM. 7) Cronic anemia on Ferrous sulfate. 8) [place] obesity.  Juanita Craver 12/29/2021, 9:21 AM

## 2021-12-29 NOTE — Plan of Care (Signed)

## 2021-12-29 NOTE — Progress Notes (Signed)
PROGRESS NOTE    Sherri Gill  DJS:[id number] DOB: 02/03/1951 DOA: 12/28/2021 PCP: [name], MD     Brief Narrative:  71 year old female with a history for diabetes, hypertension, hyperlipidemia presenting for abdominal distention and pain.  This is been going on for the past month and getting worse.  She had an abnormal pelvic ultrasound in December 2022 showing endometrial thickness.  Her PCP referred her to a gynecologist but she had canceled appointment due to her current issue.   New events last 24 hours / Subjective: Patient just had a paracentesis.  Fluid is exudative.  Repeat ultrasound showed continued endometrial thickening.  She is still very tender over her abdomen but does feel better and less distended.  Appetite is slowly returning.  Assessment & Plan:   Principal Problem:   Abdominal ascites  S/p paracentesis, exudative fluid  Needs to sched appt w GYN, had appt scheduled but cancelled due to ascites  Repeat US shows same endometrial thickening  Tylenol and tramadol prn pain   Active Problems:   Essential hypertension  Cont Norvasc 10 mg/d  Hold losartan while kidneys recover    Iron deficiency anemia  Monitor BMP    Type 2 diabetes mellitus with neurological complications ([place])  Hold home metformin, sliding scale insulin    Abnormal transvaginal ultrasound  Strongly encouraged her to reschedule her appointment with a gynecologist she likely needs a biopsy    AKI (acute kidney injury) ([place])  Hold nephrotoxins  Will give some gentle hydration for a few hours while she advances her diet  Monitor BMP    Leukocytosis  DVT prophylaxis: SCDs Code Status: Full Family Communication: Self Coming From: Home Disposition Plan: Home Barriers to Discharge: Clinical improvement  Consultants:  GI  Procedures:  Paracentesis 12/29/21  Objective: Vitals:   12/29/21 1035 12/29/21 1050 12/29/21 1055 12/29/21 1100  BP: 138/78 (!) 150/73 136/76 (!)  146/72  Pulse:      Resp:      Temp:      TempSrc:      SpO2:        Examination:  General exam: Appears calm and comfortable  Respiratory system: Clear to auscultation. Respiratory effort normal. No respiratory distress. No conversational dyspnea.  Cardiovascular system: S1 & S2 heard, reg rhythm, tachycardic. No murmurs. No pedal edema. Gastrointestinal system: Abdomen is nondistended, soft and diffusely tender. Normal bowel sounds heard. Central nervous system: Alert and oriented. No focal neurological deficits. Speech clear.  Extremities: Symmetric in appearance  Skin: No rashes, lesions or ulcers on exposed skin  Psychiatry: Judgement and insight appear normal. Mood & affect appropriate.   Data Reviewed: I have personally reviewed following labs and imaging studies  CBC: Recent Labs  Lab 12/28/21 1222 12/29/21 0540  WBC 15.7* 15.3*  HGB 10.4* 10.4*  HCT 33.3* 32.6*  MCV 76.0* 75.1*  PLT 756* 885*   Basic Metabolic Panel: Recent Labs  Lab 12/28/21 1222 12/29/21 0540  NA 133* 133*  K 3.4* 3.5  CL 93* 95*  CO2 26 25  GLUCOSE 122* 136*  BUN 48* 50*  CREATININE 1.86* 1.75*  CALCIUM 9.2 9.3   Liver Function Tests: Recent Labs  Lab 12/28/21 1222 12/29/21 0540  AST 10* 12*  ALT 10 11  ALKPHOS 100 114  BILITOT 0.2* 0.5  PROT 7.8 7.7  ALBUMIN 3.3* 3.1*   Recent Labs  Lab 12/28/21 1222 12/29/21 1206  LIPASE 30  --   AMYLASE  --  34  Coagulation Profile: Recent Labs  Lab 12/28/21 1806 12/29/21 0540  INR 1.2 1.2   CBG: Recent Labs  Lab 12/29/21 1152  GLUCAP 125*   Radiology Studies: CT ABDOMEN PELVIS WO CONTRAST  Result Date: 12/28/2021 CLINICAL DATA:  Acute abdominal pain.  Known hernia. EXAM: CT ABDOMEN AND PELVIS WITHOUT CONTRAST TECHNIQUE: Multidetector CT imaging of the abdomen and pelvis was performed following the standard protocol without IV contrast. RADIATION DOSE REDUCTION: This exam was performed according to the departmental  dose-optimization program which includes automated exposure control, adjustment of the mA and/or kV according to patient size and/or use of iterative reconstruction technique. COMPARISON:  Ultrasound abdomen 10/18/2021. FINDINGS: Lower chest: There is minimal atelectasis in the left lung base. Hepatobiliary: No focal liver abnormality is seen. No gallstones, gallbladder wall thickening, or biliary dilatation. Pancreas: Unremarkable. No pancreatic ductal dilatation or surrounding inflammatory changes. Spleen: Normal in size without focal abnormality. Adrenals/Urinary Tract: Adrenal glands are unremarkable. Kidneys are normal, without renal calculi, focal lesion, or hydronephrosis. Bladder is unremarkable. Stomach/Bowel: Stomach is within normal limits. No evidence of bowel wall thickening, distention, or inflammatory changes. There is diffuse colonic diverticulosis without evidence for diverticulitis. The appendix is not visualized. Vascular/Lymphatic: No significant vascular findings are present. No enlarged abdominal or pelvic lymph nodes. Reproductive: Uterus and bilateral adnexa are unremarkable. There are surgical clips in the bilateral adnexa. Other: There is large volume ascites in all 4 abdominal quadrants. There is no free intraperitoneal air. There is a small umbilical hernia containing ascites. There is also some haziness of omental fat in the anterior abdomen. Musculoskeletal: No fracture is seen. No focal osseous lesion identified. IMPRESSION: 1. Large volume ascites. 2. Small umbilical hernia containing fat and ascites. 3. Haziness of anterior omentum most likely related to ascites, other etiologies such as metastatic disease can not be excluded. 4. Diffuse colonic diverticulosis without evidence for diverticulitis. Electronically Signed   By: Ronney Asters M.D.   On: 12/28/2021 15:12   DG Chest 2 View  Result Date: 12/28/2021 CLINICAL DATA:  Chest pain EXAM: CHEST - 2 VIEW COMPARISON:  11/02/2021  FINDINGS: Cardiac shadow is within normal limits. Mild vascular prominence is noted with stable vessel on end in the left mid lung. Lungs are well aerated bilaterally. No focal infiltrate or sizable effusion is seen. Degenerative changes of the thoracic spine are noted. IMPRESSION: No acute abnormality noted. Electronically Signed   By: Inez Catalina M.D.   On: 12/28/2021 20:21   US PELVIC COMPLETE WITH TRANSVAGINAL  Result Date: 12/28/2021 CLINICAL DATA:  71 year old with endometrial thickening on ultrasound. Abdominal ascites. EXAM: TRANSABDOMINAL AND TRANSVAGINAL ULTRASOUND OF PELVIS TECHNIQUE: Both transabdominal and transvaginal ultrasound examinations of the pelvis were performed. Transabdominal technique was performed for global imaging of the pelvis including uterus, ovaries, adnexal regions, and pelvic cul-de-sac. It was necessary to proceed with endovaginal exam following the transabdominal exam to visualize the uterus and endometrium. COMPARISON:  Pelvic ultrasound 10/12/2021. Abdominopelvic CT earlier today. FINDINGS: Uterus Measurements: 7.9 x 5 x 5.2 cm = volume: 107 mL. The uterus is anteverted. No fibroids or other mass visualized. Endometrium Thickness: 22-36 mm, heterogeneous and complex. Probable endometrial fluid. There is some endometrial vascularity. Right ovary Not visualized. Left ovary Not visualized. Other findings Pelvic ascites, also seen on recent CT. IMPRESSION: 1. Persistent abnormal appearance of the endometrium, heterogeneously thickened and complex with possible intraluminal fluid. This is similar to that seen on December 2022 ultrasound. Endometrial thickness is considered abnormal for an asymptomatic post-menopausal female.  Endometrial sampling should be considered to exclude carcinoma. 2. Nonvisualization of the ovaries. Electronically Signed   By: Keith Rake M.D.   On: 12/28/2021 21:02     Scheduled Meds:  amLODipine  10 mg Oral Daily   insulin aspart  0-9 Units  Subcutaneous TID WC   Continuous Infusions:   LOS: 0 days    Time spent: 30 minutes   Shelda Pal, DO [facility] 12/29/2021, 2:13 PM   Available via Epic secure chat 7am-7pm After these hours, please refer to coverage provider listed on [email or web address]

## 2021-12-29 NOTE — Procedures (Signed)
PROCEDURE SUMMARY: ? ?Successful US guided paracentesis from right lateral abdomen.  ?Yielded 2.8L of yellow, cloudy fluid.  ?No immediate complications.  ?Pt tolerated well.  ? ?Specimen was sent for labs. ? ?EBL < 63m ? ?KDocia BarrierPA-C ?12/29/2021 ?10:50 AM ? ? ? ?

## 2021-12-30 ENCOUNTER — Observation Stay (HOSPITAL_COMMUNITY): Payer: Medicare HMO

## 2021-12-30 DIAGNOSIS — N179 Acute kidney failure, unspecified: Secondary | ICD-10-CM | POA: Diagnosis not present

## 2021-12-30 DIAGNOSIS — I10 Essential (primary) hypertension: Secondary | ICD-10-CM | POA: Diagnosis not present

## 2021-12-30 DIAGNOSIS — R180 Malignant ascites: Secondary | ICD-10-CM | POA: Diagnosis not present

## 2021-12-30 DIAGNOSIS — R0600 Dyspnea, unspecified: Secondary | ICD-10-CM | POA: Diagnosis present

## 2021-12-30 DIAGNOSIS — D509 Iron deficiency anemia, unspecified: Secondary | ICD-10-CM | POA: Diagnosis not present

## 2021-12-30 DIAGNOSIS — E1149 Type 2 diabetes mellitus with other diabetic neurological complication: Secondary | ICD-10-CM | POA: Diagnosis not present

## 2021-12-30 LAB — CBC
HCT: 32.5 % — ABNORMAL LOW (ref 36.0–46.0)
Hemoglobin: 10.3 g/dL — ABNORMAL LOW (ref 12.0–15.0)
MCH: 24.1 pg — ABNORMAL LOW (ref 26.0–34.0)
MCHC: 31.7 g/dL (ref 30.0–36.0)
MCV: 76.1 fL — ABNORMAL LOW (ref 80.0–100.0)
Platelets: 636 10*3/uL — ABNORMAL HIGH (ref 150–400)
RBC: 4.27 MIL/uL (ref 3.87–5.11)
RDW: 15.9 % — ABNORMAL HIGH (ref 11.5–15.5)
WBC: 15.1 10*3/uL — ABNORMAL HIGH (ref 4.0–10.5)
nRBC: 0 % (ref 0.0–0.2)

## 2021-12-30 LAB — BASIC METABOLIC PANEL
Anion gap: 8 (ref 5–15)
BUN: 42 mg/dL — ABNORMAL HIGH (ref 8–23)
CO2: 28 mmol/L (ref 22–32)
Calcium: 8.7 mg/dL — ABNORMAL LOW (ref 8.9–10.3)
Chloride: 99 mmol/L (ref 98–111)
Creatinine, Ser: 1.21 mg/dL — ABNORMAL HIGH (ref 0.44–1.00)
GFR, Estimated: 49 mL/min — ABNORMAL LOW (ref >60)
Glucose, Bld: 146 mg/dL — ABNORMAL HIGH (ref 70–99)
Potassium: 3.6 mmol/L (ref 3.5–5.1)
Sodium: 135 mmol/L (ref 135–145)

## 2021-12-30 LAB — HEMOGLOBIN A1C
Hgb A1c MFr Bld: 6.7 % — ABNORMAL HIGH (ref 4.8–5.6)
Mean Plasma Glucose: 145.59 mg/dL

## 2021-12-30 LAB — GLUCOSE, CAPILLARY
Glucose-Capillary: 162 mg/dL — ABNORMAL HIGH (ref 70–99)
Glucose-Capillary: 145 mg/dL — ABNORMAL HIGH (ref 70–99)
Glucose-Capillary: 150 mg/dL — ABNORMAL HIGH (ref 70–99)
Glucose-Capillary: 176 mg/dL — ABNORMAL HIGH (ref 70–99)
Glucose-Capillary: 191 mg/dL — ABNORMAL HIGH (ref 70–99)

## 2021-12-30 MED ORDER — IPRATROPIUM-ALBUTEROL 0.5-2.5 (3) MG/3ML IN SOLN
3.0000 mL | Freq: Once | RESPIRATORY_TRACT | Status: AC
  Administered 2021-12-30: 3 mL via RESPIRATORY_TRACT
  Filled 2021-12-30: qty 3

## 2021-12-30 MED ORDER — PREDNISONE 5 MG PO TABS
50.0000 mg | ORAL_TABLET | Freq: Every day | ORAL | Status: DC
  Administered 2021-12-30 – 2021-12-31 (×2): 50 mg via ORAL
  Filled 2021-12-30 (×2): qty 2

## 2021-12-30 MED ORDER — TRAMADOL HCL 50 MG PO TABS
50.0000 mg | ORAL_TABLET | ORAL | Status: DC | PRN
  Administered 2021-12-31: 50 mg via ORAL
  Filled 2021-12-30: qty 1

## 2021-12-30 MED ORDER — TRAMADOL HCL 50 MG PO TABS
50.0000 mg | ORAL_TABLET | Freq: Four times a day (QID) | ORAL | Status: DC | PRN
  Administered 2021-12-30: 100 mg via ORAL
  Filled 2021-12-30: qty 2

## 2021-12-30 NOTE — Plan of Care (Signed)
  Problem: Education: Goal: Knowledge of General Education information will improve Description Including pain rating scale, medication(s)/side effects and non-pharmacologic comfort measures Outcome: Progressing   

## 2021-12-30 NOTE — Progress Notes (Signed)
PROGRESS NOTE    Sherri Gill  HAL:[id number] DOB: 11/24/1950 DOA: 12/28/2021 PCP: [name], MD     Brief Narrative:  71 year old female with a history for diabetes, hypertension, hyperlipidemia presenting for abdominal distention and pain.  This is been going on for the past month and getting worse.  She had an abnormal pelvic ultrasound in December 2022 showing endometrial thickness.  Her PCP referred her to a gynecologist but she had canceled appointment due to her current issue.  New events last 24 hours / Subjective: Paracentesis revealed fluid that is exudative in nature, concerning for malignancy.  Her oxygen saturations have been dropping.  She also reports wheezing.  No known history of COPD or asthma.  Chest x-ray did not reveal any pleural effusion or edema.  Pain is somewhat controlled with tramadol but seems to wear off quickly.  She does have adverse effects with codeine and oxycodone as it makes her very nauseous and throw up.  Assessment & Plan:   Principal Problem:   Abdominal ascites  Status post paracentesis showing exudative fluid on 3/4  She will need to schedule follow-up appointment with a gynecologist on outpatient basis  Tylenol as needed  I sedated her giving her 100 mg of tramadol, will change to 50 mg every 4 hours as needed  Active Problems:   Dyspnea  CXR unremarkable  Will start 50 mg of prednisone daily for 5 days  Will have PT and OT work with her  Maintain oxygen saturations 88% or higher     Essential hypertension  Continue Norvasc 10 mg daily  Continue to hold losartan while kidney function normalizes    Iron deficiency anemia  Monitor CBC    Type 2 diabetes mellitus with neurological complications ([facility])  Holding home metformin  Sliding scale insulin    Abnormal transvaginal ultrasound  Needs outpatient follow-up    AKI (acute kidney injury) ([place])  Improving  Monitor BMP    SIRS  Monitor CBC  Ck urine culture and blood  cultures  DVT prophylaxis: SCDs Code Status: Full Family Communication: Self Coming From: Home Disposition Plan: TBD at this time Barriers to Discharge: Clinical improvement with breathing  Consultants:  Gastroenterology Interventional radiology  Procedures:  Paracentesis on 12/29/2021  Objective: Vitals:   12/29/21 1955 12/30/21 0508 12/30/21 0910 12/30/21 1329  BP: (!) 160/61 (!) 154/74  (!) 141/77  Pulse: (!) 108 (!) 103  (!) 107  Resp: '18 18  15  '$ Temp: 98.2 F (36.8 C) 98 F (36.7 C)  97.9 F (36.6 C)  TempSrc:      SpO2: 100% 93% 94% 93%    Intake/Output Summary (Last 24 hours) at 12/30/2021 1410 Last data filed at 12/30/2021 1332 Gross per 24 hour  Intake 1334.12 ml  Output --  Net 1334.12 ml   Examination:  General exam: Appears calm and comfortable  Respiratory system: Clear to auscultation. Respiratory effort normal. No respiratory distress. No conversational dyspnea.  Cardiovascular system: S1 & S2 heard, RRR. No murmurs. No pedal edema. Gastrointestinal system: Abdomen is mildly distended, centrally hard and diffusely tender. Normal bowel sounds heard. Central nervous system: Alert and oriented. No focal neurological deficits. Speech clear.  Extremities: Symmetric in appearance  Skin: No rashes, lesions or ulcers on exposed skin  Psychiatry: Judgement and insight appear normal. Mood & affect appropriate.   Data Reviewed: I have personally reviewed following labs and imaging studies  CBC: Recent Labs  Lab 12/28/21 1222 12/29/21 0540 12/30/21 0303  WBC 15.7* 15.3* 15.1*  HGB 10.4* 10.4* 10.3*  HCT 33.3* 32.6* 32.5*  MCV 76.0* 75.1* 76.1*  PLT 756* 787* 245*   Basic Metabolic Panel: Recent Labs  Lab 12/28/21 1222 12/29/21 0540 12/30/21 0303  NA 133* 133* 135  K 3.4* 3.5 3.6  CL 93* 95* 99  CO2 '26 25 28  '$ GLUCOSE 122* 136* 146*  BUN 48* 50* 42*  CREATININE 1.86* 1.75* 1.21*  CALCIUM 9.2 9.3 8.7*   Liver Function Tests: Recent Labs  Lab  12/28/21 1222 12/29/21 0540  AST 10* 12*  ALT 10 11  ALKPHOS 100 114  BILITOT 0.2* 0.5  PROT 7.8 7.7  ALBUMIN 3.3* 3.1*   Recent Labs  Lab 12/28/21 1222 12/29/21 1206  LIPASE 30  --   AMYLASE  --  34   Coagulation Profile: Recent Labs  Lab 12/28/21 1806 12/29/21 0540  INR 1.2 1.2   HbA1C: Recent Labs    12/29/21 0540  HGBA1C 6.7*   CBG: Recent Labs  Lab 12/29/21 1152 12/29/21 1648 12/29/21 2117 12/30/21 0719 12/30/21 1114  GLUCAP 125* 110* 162* 145* 150*   Recent Results (from the past 240 hour(s))  Culture, body fluid w Gram Stain-bottle     Status: None (Preliminary result)   Collection Time: 12/29/21 11:18 AM   Specimen: Peritoneal Washings  Result Value Ref Range Status   Specimen Description PERITONEAL FLUID  Final   Special Requests NONE  Final   Culture   Final    NO GROWTH < 24 HOURS Performed at [facility], [place] [address], [place], [place] [address]    Report Status PENDING  Incomplete  Gram stain     Status: None   Collection Time: 12/29/21 11:18 AM   Specimen: Peritoneal Washings  Result Value Ref Range Status   Specimen Description PERITONEAL FLUID  Final   Special Requests NONE  Final   Gram Stain   Final    WBC PRESENT,BOTH PMN AND MONONUCLEAR NO ORGANISMS SEEN CYTOSPIN SMEAR Performed at [facility], [address], [place], [place] [address]    Report Status 12/29/2021 FINAL  Final      Radiology Studies: CT ABDOMEN PELVIS WO CONTRAST  Result Date: 12/28/2021 CLINICAL DATA:  Acute abdominal pain.  Known hernia. EXAM: CT ABDOMEN AND PELVIS WITHOUT CONTRAST TECHNIQUE: Multidetector CT imaging of the abdomen and pelvis was performed following the standard protocol without IV contrast. RADIATION DOSE REDUCTION: This exam was performed according to the departmental dose-optimization program which includes automated exposure control, adjustment of the mA and/or kV according to patient size and/or use of iterative  reconstruction technique. COMPARISON:  Ultrasound abdomen 10/18/2021. FINDINGS: Lower chest: There is minimal atelectasis in the left lung base. Hepatobiliary: No focal liver abnormality is seen. No gallstones, gallbladder wall thickening, or biliary dilatation. Pancreas: Unremarkable. No pancreatic ductal dilatation or surrounding inflammatory changes. Spleen: Normal in size without focal abnormality. Adrenals/Urinary Tract: Adrenal glands are unremarkable. Kidneys are normal, without renal calculi, focal lesion, or hydronephrosis. Bladder is unremarkable. Stomach/Bowel: Stomach is within normal limits. No evidence of bowel wall thickening, distention, or inflammatory changes. There is diffuse colonic diverticulosis without evidence for diverticulitis. The appendix is not visualized. Vascular/Lymphatic: No significant vascular findings are present. No enlarged abdominal or pelvic lymph nodes. Reproductive: Uterus and bilateral adnexa are unremarkable. There are surgical clips in the bilateral adnexa. Other: There is large volume ascites in all 4 abdominal quadrants. There is no free intraperitoneal air. There is a  small umbilical hernia containing ascites. There is also some haziness of omental fat in the anterior abdomen. Musculoskeletal: No fracture is seen. No focal osseous lesion identified. IMPRESSION: 1. Large volume ascites. 2. Small umbilical hernia containing fat and ascites. 3. Haziness of anterior omentum most likely related to ascites, other etiologies such as metastatic disease can not be excluded. 4. Diffuse colonic diverticulosis without evidence for diverticulitis. Electronically Signed   By: Ronney Asters M.D.   On: 12/28/2021 15:12   DG Chest 2 View  Result Date: 12/28/2021 CLINICAL DATA:  Chest pain EXAM: CHEST - 2 VIEW COMPARISON:  11/02/2021 FINDINGS: Cardiac shadow is within normal limits. Mild vascular prominence is noted with stable vessel on end in the left mid lung. Lungs are well  aerated bilaterally. No focal infiltrate or sizable effusion is seen. Degenerative changes of the thoracic spine are noted. IMPRESSION: No acute abnormality noted. Electronically Signed   By: Inez Catalina M.D.   On: 12/28/2021 20:21   US Paracentesis  Result Date: 12/29/2021 INDICATION: Patient with abdominal pain, distension, endometrial thickening on imaging, new ascites. Request is made for diagnostic and therapeutic paracentesis of 5 L maximum. EXAM: ULTRASOUND GUIDED DIAGNOSTIC AND THERAPEUTIC PARACENTESIS MEDICATIONS: 10 mL 1% lidocaine COMPLICATIONS: None immediate. PROCEDURE: Informed written consent was obtained from the patient after a discussion of the risks, benefits and alternatives to treatment. A timeout was performed prior to the initiation of the procedure. Initial ultrasound scanning demonstrates a moderate amount of ascites within the right lower abdominal quadrant. The right lower abdomen was prepped and draped in the usual sterile fashion. 1% lidocaine was used for local anesthesia. Following this, a 19 gauge, 10-cm, Yueh catheter was introduced. An ultrasound image was saved for documentation purposes. The paracentesis was performed. The catheter was removed and a dressing was applied. The patient tolerated the procedure well without immediate post procedural complication. FINDINGS: A total of approximately 2.8 liters of yellow, cloudy fluid was removed. Samples were sent to the laboratory as requested by the clinical team. IMPRESSION: Successful ultrasound-guided paracentesis yielding 2.8 liters of peritoneal fluid. Read by: Brynda Greathouse PA-C Electronically Signed   By: Albin Felling M.D.   On: 12/29/2021 13:56   DG CHEST PORT 1 VIEW  Result Date: 12/30/2021 CLINICAL DATA:  Dyspnea. EXAM: PORTABLE CHEST 1 VIEW COMPARISON:  12/28/2021 FINDINGS: Stable cardiomediastinal contours. Lung volumes are low. No pleural effusion or edema. No airspace opacities identified. Visualized osseous  structures are unremarkable. IMPRESSION: No acute cardiopulmonary abnormalities. Electronically Signed   By: Kerby Moors M.D.   On: 12/30/2021 10:12   US PELVIC COMPLETE WITH TRANSVAGINAL  Result Date: 12/28/2021 CLINICAL DATA:  71 year old with endometrial thickening on ultrasound. Abdominal ascites. EXAM: TRANSABDOMINAL AND TRANSVAGINAL ULTRASOUND OF PELVIS TECHNIQUE: Both transabdominal and transvaginal ultrasound examinations of the pelvis were performed. Transabdominal technique was performed for global imaging of the pelvis including uterus, ovaries, adnexal regions, and pelvic cul-de-sac. It was necessary to proceed with endovaginal exam following the transabdominal exam to visualize the uterus and endometrium. COMPARISON:  Pelvic ultrasound 10/12/2021. Abdominopelvic CT earlier today. FINDINGS: Uterus Measurements: 7.9 x 5 x 5.2 cm = volume: 107 mL. The uterus is anteverted. No fibroids or other mass visualized. Endometrium Thickness: 22-36 mm, heterogeneous and complex. Probable endometrial fluid. There is some endometrial vascularity. Right ovary Not visualized. Left ovary Not visualized. Other findings Pelvic ascites, also seen on recent CT. IMPRESSION: 1. Persistent abnormal appearance of the endometrium, heterogeneously thickened and complex with possible intraluminal fluid. This  is similar to that seen on December 2022 ultrasound. Endometrial thickness is considered abnormal for an asymptomatic post-menopausal female. Endometrial sampling should be considered to exclude carcinoma. 2. Nonvisualization of the ovaries. Electronically Signed   By: Keith Rake M.D.   On: 12/28/2021 21:02     Scheduled Meds:  amLODipine  10 mg Oral Daily   atorvastatin  20 mg Oral Daily   insulin aspart  0-9 Units Subcutaneous TID WC   predniSONE  50 mg Oral Q breakfast   Continuous Infusions:   LOS: 0 days    Time spent: 30 minutes   Shelda Pal, DO [facility] 12/30/2021, 2:10  PM   Available via Epic secure chat 7am-7pm After these hours, please refer to coverage provider listed on [email or web address]

## 2021-12-30 NOTE — Evaluation (Signed)
Occupational Therapy Evaluation ?Patient Details ?Name: Sherri Gill ?MRN: [id number] ?DOB: 09/06/1951 ?Today's Date: 12/30/2021 ? ? ?History of Present Illness Patient is a 71 year old female who presented to the hosptial with abdominal distension and pain. patient was fount to have abdominal ascites, dyspnea, iron deficiency anemia. patient had a paracentesis removing 2.8 L of fluid on 3/4. PMH: DM, HTN, hyperlipidemia.  ? ?Clinical Impression ?  ?Patient evaluated by Occupational Therapy with no further acute OT needs identified. All education has been completed and the patient has no further questions. Patient was able to stand at sink to complete ADL tasks with MI and no LOB with no AD. Patient endorses not needed OT services at this time.  See below for any follow-up Occupational Therapy or equipment needs. OT is signing off. Thank you for this referral. ?  ?   ? ?Recommendations for follow up therapy are one component of a multi-disciplinary discharge planning process, led by the attending physician.  Recommendations may be updated based on patient status, additional functional criteria and insurance authorization.  ? ?Follow Up Recommendations ? No OT follow up  ?  ?Assistance Recommended at Discharge PRN  ?Patient can return home with the following Assistance with cooking/housework ? ?  ?Functional Status Assessment ? Patient has not had a recent decline in their functional status  ?Equipment Recommendations ? None recommended by OT  ?  ?Recommendations for Other Services   ? ? ?  ?Precautions / Restrictions Restrictions ?Weight Bearing Restrictions: No  ? ?  ? ?Mobility Bed Mobility ?Overal bed mobility: Modified Independent ?  ?  ?  ?  ?  ?  ?  ?  ? ?Transfers ?  ?  ?  ?  ?  ?  ?  ?  ?  ?  ?  ? ?  ?Balance Overall balance assessment: No apparent balance deficits (not formally assessed) ?  ?  ?  ?  ?  ?  ?  ?  ?  ?  ?  ?  ?  ?  ?  ?  ?  ?  ?   ? ?ADL either performed or assessed with clinical judgement   ? ?ADL Overall ADL's : Modified independent ?  ?  ?  ?  ?  ?  ?  ?  ?  ?  ?  ?  ?  ?  ?  ?  ?  ?  ?  ?General ADL Comments: patient was able to complete bed mobility, functional mobility in room with no AD, standing at sink to complete UB and LB bathing tasks and grooming tasks with no assistance needed from therapist. patient had no LOB. patient endoresed being at baseline for ADLs. patient was educated on energy conservation techniques. patient verbalized understanding.  ? ? ? ?Vision Patient Visual Report: No change from baseline ?Vision Assessment?: No apparent visual deficits  ?   ?Perception   ?  ?Praxis   ?  ? ?Pertinent Vitals/Pain Pain Assessment ?Pain Assessment: Faces ?Faces Pain Scale: Hurts whole lot ?Pain Location: abdomen ?Pain Descriptors / Indicators: Discomfort ?Pain Intervention(s): Monitored during session  ? ? ? ?Hand Dominance Right ?  ?Extremity/Trunk Assessment Upper Extremity Assessment ?Upper Extremity Assessment: Overall WFL for tasks assessed ?  ?Lower Extremity Assessment ?Lower Extremity Assessment: Defer to PT evaluation ?  ?Cervical / Trunk Assessment ?Cervical / Trunk Assessment: Normal ?  ?Communication Communication ?Communication: No difficulties ?  ?Cognition Arousal/Alertness: Awake/alert ?Behavior During Therapy: [facility] for tasks  assessed/performed ?Overall Cognitive Status: Within Functional Limits for tasks assessed ?  ?  ?  ?  ?  ?  ?  ?  ?  ?  ?  ?  ?  ?  ?  ?  ?  ?  ?  ?General Comments    ? ?  ?Exercises   ?  ?Shoulder Instructions    ? ? ?Home Living Family/patient expects to be discharged to:: Private residence ?Living Arrangements: Children (son) ?  ?  ?Home Access: Stairs to enter ?Entrance Stairs-Number of Steps: 3 ?Entrance Stairs-Rails: Right ?Home Layout: One level ?  ?  ?Bathroom Shower/Tub: Tub/shower unit ?  ?  ?  ?  ?Home Equipment: Tub bench ?  ?  ?  ? ?  ?Prior Functioning/Environment Prior Level of Function : Independent/Modified Independent ?  ?  ?  ?  ?  ?   ?  ?ADLs Comments: working at [employer] ?  ? ?  ?  ?OT Problem List: Pain ?  ?   ?OT Treatment/Interventions:    ?  ?OT Goals(Current goals can be found in the care plan section) Acute Rehab OT Goals ?OT Goal Formulation: All assessment and education complete, DC therapy  ?OT Frequency:   ?  ? ?Co-evaluation   ?  ?  ?  ?  ? ?  ?AM-PAC OT "6 Clicks" Daily Activity     ?Outcome Measure Help from another person eating meals?: None ?Help from another person taking care of personal grooming?: None ?Help from another person toileting, which includes using toliet, bedpan, or urinal?: None ?Help from another person bathing (including washing, rinsing, drying)?: None ?Help from another person to put on and taking off regular upper body clothing?: None ?Help from another person to put on and taking off regular lower body clothing?: None ?6 Click Score: 24 ?  ?End of Session   ? ?Activity Tolerance: Patient tolerated treatment well ?Patient left: in bed;with call bell/phone within reach ? ?OT Visit Diagnosis: Unsteadiness on feet (R26.81)  ?              ?Time: 7416-3845 ?OT Time Calculation (min): 28 min ?Charges:  OT General Charges ?$OT Visit: 1 Visit ?OT Evaluation ?$OT Eval Low Complexity: 1 Low ?OT Treatments ?$Self Care/Home Management : 8-22 mins ? ?Elnore Cosens OTR/L, MS ?Acute Rehabilitation Department ?Office# [phone] ?Pager# [phone] ? ? ?Feliz Beam Kaylie Ritter ?12/30/2021, 3:45 PM ?

## 2021-12-30 NOTE — Plan of Care (Signed)
  Problem: Education: Goal: Knowledge of General Education information will improve Description Including pain rating scale, medication(s)/side effects and non-pharmacologic comfort measures Outcome: Progressing   Problem: Activity: Goal: Risk for activity intolerance will decrease Outcome: Progressing   Problem: Safety: Goal: Ability to remain free from injury will improve Outcome: Progressing   

## 2021-12-31 DIAGNOSIS — I10 Essential (primary) hypertension: Secondary | ICD-10-CM | POA: Diagnosis not present

## 2021-12-31 DIAGNOSIS — R188 Other ascites: Secondary | ICD-10-CM | POA: Diagnosis not present

## 2021-12-31 DIAGNOSIS — R0600 Dyspnea, unspecified: Secondary | ICD-10-CM

## 2021-12-31 DIAGNOSIS — N179 Acute kidney failure, unspecified: Secondary | ICD-10-CM

## 2021-12-31 DIAGNOSIS — D72829 Elevated white blood cell count, unspecified: Secondary | ICD-10-CM | POA: Diagnosis not present

## 2021-12-31 DIAGNOSIS — R9389 Abnormal findings on diagnostic imaging of other specified body structures: Secondary | ICD-10-CM

## 2021-12-31 DIAGNOSIS — D509 Iron deficiency anemia, unspecified: Secondary | ICD-10-CM | POA: Diagnosis not present

## 2021-12-31 LAB — CBC
HCT: 32.3 % — ABNORMAL LOW (ref 36.0–46.0)
Hemoglobin: 10 g/dL — ABNORMAL LOW (ref 12.0–15.0)
MCH: 23.6 pg — ABNORMAL LOW (ref 26.0–34.0)
MCHC: 31 g/dL (ref 30.0–36.0)
MCV: 76.2 fL — ABNORMAL LOW (ref 80.0–100.0)
Platelets: 741 10*3/uL — ABNORMAL HIGH (ref 150–400)
RBC: 4.24 MIL/uL (ref 3.87–5.11)
RDW: 15.8 % — ABNORMAL HIGH (ref 11.5–15.5)
WBC: 18.4 10*3/uL — ABNORMAL HIGH (ref 4.0–10.5)
nRBC: 0 % (ref 0.0–0.2)

## 2021-12-31 LAB — BASIC METABOLIC PANEL
Anion gap: 11 (ref 5–15)
BUN: 44 mg/dL — ABNORMAL HIGH (ref 8–23)
CO2: 28 mmol/L (ref 22–32)
Calcium: 9 mg/dL (ref 8.9–10.3)
Chloride: 94 mmol/L — ABNORMAL LOW (ref 98–111)
Creatinine, Ser: 1.4 mg/dL — ABNORMAL HIGH (ref 0.44–1.00)
GFR, Estimated: 41 mL/min — ABNORMAL LOW (ref >60)
Glucose, Bld: 203 mg/dL — ABNORMAL HIGH (ref 70–99)
Potassium: 3.8 mmol/L (ref 3.5–5.1)
Sodium: 133 mmol/L — ABNORMAL LOW (ref 135–145)

## 2021-12-31 LAB — GLUCOSE, CAPILLARY
Glucose-Capillary: 173 mg/dL — ABNORMAL HIGH (ref 70–99)
Glucose-Capillary: 233 mg/dL — ABNORMAL HIGH (ref 70–99)

## 2021-12-31 MED ORDER — ONDANSETRON HCL 4 MG PO TABS: 4.0000 mg | ORAL_TABLET | Freq: Four times a day (QID) | ORAL | 0 refills | Status: DC | PRN

## 2021-12-31 MED ORDER — ALBUTEROL SULFATE HFA 108 (90 BASE) MCG/ACT IN AERS: 2.0000 | INHALATION_SPRAY | RESPIRATORY_TRACT | 0 refills | Status: DC | PRN

## 2021-12-31 MED ORDER — PREDNISONE 20 MG PO TABS: 40.0000 mg | ORAL_TABLET | Freq: Every day | ORAL | 0 refills | Status: DC

## 2021-12-31 MED ORDER — TRAMADOL HCL 50 MG PO TABS: 50.0000 mg | ORAL_TABLET | Freq: Four times a day (QID) | ORAL | 0 refills | Status: DC | PRN

## 2021-12-31 NOTE — Progress Notes (Signed)
SATURATION QUALIFICATIONS: (This note is used to comply with regulatory documentation for home oxygen) ? ?Patient Saturations on Room Air at Rest = 90% ? ?Patient Saturations on Room Air while Ambulating = 87% ? ?Patient Saturations on 2 Liters of oxygen while Ambulating = 98% ? ?Please briefly explain why patient needs home oxygen:to maintain adequate O2 sats during activity ? ?Baxter Flattery, PT ? ?Acute Rehab Dept [facility]) [phone] ?Pager [phone] ? ?12/31/2021 ? ?

## 2021-12-31 NOTE — Plan of Care (Signed)
Patient discharged via private vehicle. ?Ivan Anchors, RN ?12/31/21 ?5:41 PM ? ?

## 2021-12-31 NOTE — Progress Notes (Signed)
PT [place]  NOTE ? 12/31/21 1300  ?PT Visit Information  ?Last PT Received On 12/31/21 ?  ?Assistance Needed +1  ?History of Present Illness Patient is a 71 year old female who presented to the hosptial with abdominal distension and pain. patient was fount to have abdominal ascites, dyspnea, iron deficiency anemia. patient had a paracentesis removing 2.8 L of fluid on 3/4. PMH: DM, HTN, hyperlipidemia.  ?Subjective Data  ?Patient Stated Goal home  ?Precautions  ?Precautions Fall  ?Precaution Comments monitor Sats  ?Restrictions  ?Weight Bearing Restrictions No  ?Pain Assessment  ?Pain Assessment Faces  ?Faces Pain Scale 6  ?Pain Location abdomen/pelvic area  ?Pain Descriptors / Indicators Discomfort  ?Pain Intervention(s) Limited activity within patient's tolerance;Monitored during session;Repositioned  ?Cognition  ?Arousal/Alertness Awake/alert  ?Behavior During Therapy [facility] for tasks assessed/performed  ?Overall Cognitive Status Within Functional Limits for tasks assessed  ?Bed Mobility  ?Overal bed mobility Modified Independent  ?Transfers  ?Overall transfer level Needs assistance  ?Equipment used Rolling walker (2 wheels)  ?Transfers Sit to/from Stand  ?Sit to Stand Min guard  ?General transfer comment for safety  ?Ambulation/Gait  ?Ambulation/Gait assistance Min guard;Min assist  ?Gait Distance (Feet) 140 Feet  ?Assistive device None ?(pushing dinamap)  ?Gait Pattern/deviations Step-through pattern;Decreased stride length;Drifts right/left  ?General Gait Details LOB to R x1 with min assist to recover, pt reports feeling as if she is "leaning". min to min/guard to steady, pt without overt LOB. 3/4DOE, SpO2=87% on RA, incr to 98% on 2L  ?PT - End of Session  ?Equipment Utilized During Treatment Gait belt  ?Activity Tolerance Patient tolerated treatment well  ?Patient left with call bell/phone within reach;in bed;with bed alarm set  ? PT - Assessment/Plan  ?PT Plan Current plan remains appropriate  ?PT Visit Diagnosis  Other abnormalities of gait and mobility (R26.89);Difficulty in walking, not elsewhere classified (R26.2)  ?PT Frequency (ACUTE ONLY) Min 3X/week  ?Follow Up Recommendations Home health PT  ?Assistance recommended at discharge Intermittent Supervision/Assistance  ?Patient can return home with the following Assistance with cooking/housework;Assist for transportation;Help with stairs or ramp for entrance  ?PT equipment None recommended by PT  ?AM-PAC PT "6 Clicks" Mobility Outcome Measure (Version 2)  ?Help needed turning from your back to your side while in a flat bed without using bedrails? 4  ?Help needed moving from lying on your back to sitting on the side of a flat bed without using bedrails? 3  ?Help needed moving to and from a bed to a chair (including a wheelchair)? 3  ?Help needed standing up from a chair using your arms (e.g., wheelchair or bedside chair)? 3  ?Help needed to walk in hospital room? 3  ?Help needed climbing 3-5 steps with a railing?  3  ?6 Click Score 19  ?Consider Recommendation of Discharge To: Home with HH  ?Progressive Mobility  ?What is the highest level of mobility based on the progressive mobility assessment? Level 5 (Walks with assist in room/hall) - Balance while stepping forward/back and can walk in room with assist - Complete  ?Activity Ambulated with assistance in hallway  ?PT Goal Progression  ?Progress towards PT goals Progressing toward goals  ?Acute Rehab PT Goals  ?PT Goal Formulation With patient  ?Time For Goal Achievement 01/07/22  ?Potential to Achieve Goals Good  ?PT Time Calculation  ?PT Start Time (ACUTE ONLY) 1338  ?PT Stop Time (ACUTE ONLY) 1404  ?PT Time Calculation (min) (ACUTE ONLY) 26 min  ?PT General Charges  ?$$ ACUTE  PT VISIT 1 Visit  ?PT Treatments  ?$Gait Training 23-37 mins  ? ? ?

## 2021-12-31 NOTE — Evaluation (Signed)
Physical Therapy Evaluation ?Patient Details ?Name: Sherri Gill ?MRN: [id number] ?DOB: November 19, 1950 ?Today's Date: 12/31/2021 ? ?History of Present Illness ? Patient is a 71 year old female who presented to the hosptial with abdominal distension and pain. patient was fount to have abdominal ascites, dyspnea, iron deficiency anemia. patient had a paracentesis removing 2.8 L of fluid on 3/4. PMH: DM, HTN, hyperlipidemia.  ?Clinical Impression ? Pt admitted with above diagnosis.  ?Pt with unsteady gait and fatiguing quickly today. Decr O2 sats to 86% on RA, dyspneic. Will benefit from HHPT ? Pt currently with functional limitations due to the deficits listed below (see PT Problem List). Pt will benefit from skilled PT to increase their independence and safety with mobility to allow discharge to the venue listed below.      ?   ? ?Recommendations for follow up therapy are one component of a multi-disciplinary discharge planning process, led by the attending physician.  Recommendations may be updated based on patient status, additional functional criteria and insurance authorization. ? ?Follow Up Recommendations Home health PT ? ?  ?Assistance Recommended at Discharge Intermittent Supervision/Assistance  ?Patient can return home with the following ? Assistance with cooking/housework;Assist for transportation;Help with stairs or ramp for entrance ? ?  ?Equipment Recommendations None recommended by PT  ?Recommendations for Other Services ?    ?  ?Functional Status Assessment Patient has had a recent decline in their functional status and demonstrates the ability to make significant improvements in function in a reasonable and predictable amount of time.  ? ?  ?Precautions / Restrictions Precautions ?Precautions: Fall ?Restrictions ?Weight Bearing Restrictions: No  ? ?  ? ?Mobility ? Bed Mobility ?Overal bed mobility: Modified Independent ?  ?  ?  ?  ?  ?  ?General bed mobility comments: HOB elevated ?  ? ?Transfers ?Overall  transfer level: Needs assistance ?Equipment used: Rolling walker (2 wheels) ?Transfers: Sit to/from Stand ?Sit to Stand: Min guard, Min assist ?  ?  ?  ?  ?  ?General transfer comment: light assist to rise and steady ?  ? ?Ambulation/Gait ?Ambulation/Gait assistance: Min guard, Min assist ?Gait Distance (Feet): 120 Feet ?Assistive device: None ?Gait Pattern/deviations: Step-through pattern, Decreased stride length, Drifts right/left ?  ?  ?  ?General Gait Details: slight drifting to R, pt reports feeling as if she is "leaning". min to min/guard to steady, pt without overt LOB. 3/4DOE, SpO2=86% on RA at return to room ? ?Stairs ?  ?  ?  ?  ?  ? ?Wheelchair Mobility ?  ? ?Modified Rankin (Stroke Patients Only) ?  ? ?  ? ?Balance Overall balance assessment: Mild deficits observed, not formally tested ?  ?  ?  ?  ?  ?  ?  ?  ?  ?  ?  ?  ?  ?  ?  ?  ?  ?  ?   ? ? ? ?Pertinent Vitals/Pain Pain Assessment ?Pain Assessment: Faces ?Faces Pain Scale: Hurts a little bit ?Pain Location: abdomen ?Pain Descriptors / Indicators: Discomfort ?Pain Intervention(s): Limited activity within patient's tolerance, Monitored during session  ? ? ?Home Living Family/patient expects to be discharged to:: Private residence ?Living Arrangements: Children (son) ?  ?  ?Home Access: Stairs to enter ?Entrance Stairs-Rails: Right ?Entrance Stairs-Number of Steps: 3 ?  ?Home Layout: One level ?  ?Additional Comments: pt states she may go home with a friend initially  ?  ?Prior Function Prior Level of Function : Independent/Modified Independent ?  ?  ?  ?  ?  ?  ?  Mobility Comments: pt reports independence, does al household tasks ?ADLs Comments: working at [employer] ?  ? ? ?Hand Dominance  ?   ? ?  ?Extremity/Trunk Assessment  ? Upper Extremity Assessment ?Upper Extremity Assessment: Overall WFL for tasks assessed;Defer to OT evaluation ?  ? ?Lower Extremity Assessment ?Lower Extremity Assessment: Overall WFL for tasks assessed ?  ? ?   ?Communication   ?    ?Cognition Arousal/Alertness: Awake/alert ?Behavior During Therapy: [facility] for tasks assessed/performed ?Overall Cognitive Status: Within Functional Limits for tasks assessed ?  ?  ?  ?  ?  ?  ?  ?  ?  ?  ?  ?  ?  ?  ?  ?  ?  ?  ?  ? ?  ?General Comments   ? ?  ?Exercises    ? ?Assessment/Plan  ?  ?PT Assessment Patient needs continued PT services  ?PT Problem List Decreased mobility;Decreased balance;Decreased activity tolerance;Cardiopulmonary status limiting activity ? ?   ?  ?PT Treatment Interventions Therapeutic exercise;Gait training;Functional mobility training;Therapeutic activities;Patient/family education;Balance training   ? ?PT Goals (Current goals can be found in the Care Plan section)  ?Acute Rehab PT Goals ?Patient Stated Goal: home ?PT Goal Formulation: With patient ?Time For Goal Achievement: 01/07/22 ?Potential to Achieve Goals: Good ? ?  ?Frequency Min 3X/week ?  ? ? ?Co-evaluation   ?  ?  ?  ?  ? ? ?  ?AM-PAC PT "6 Clicks" Mobility  ?Outcome Measure Help needed turning from your back to your side while in a flat bed without using bedrails?: None ?Help needed moving from lying on your back to sitting on the side of a flat bed without using bedrails?: A Little ?Help needed moving to and from a bed to a chair (including a wheelchair)?: A Little ?Help needed standing up from a chair using your arms (e.g., wheelchair or bedside chair)?: A Little ?Help needed to walk in hospital room?: A Little ?Help needed climbing 3-5 steps with a railing? : A Little ?6 Click Score: 19 ? ?  ?End of Session   ?Activity Tolerance: Patient tolerated treatment well ?Patient left: with call bell/phone within reach;in bed;with bed alarm set ?  ?PT Visit Diagnosis: Other abnormalities of gait and mobility (R26.89);Difficulty in walking, not elsewhere classified (R26.2) ?  ? ?Time: 0867-6195 ?PT Time Calculation (min) (ACUTE ONLY): 17 min ? ? ?Charges:   PT Evaluation ?$PT Eval Low Complexity: 1 Low ?  ?  ?   ?Baxter Flattery,  PT ? ?Acute Rehab Dept [facility]) [phone] ?Pager [phone] ? ?12/31/2021 ? ? ?Monserat Prestigiacomo ?12/31/2021, 10:55 AM ? ?

## 2021-12-31 NOTE — Discharge Summary (Signed)
Physician Discharge Summary   Patient: Sherri Gill MRN: [id number] DOB: 12/26/1950  Admit date:     12/28/2021  Discharge date: 12/31/21  Discharge Physician: [name]   PCP: [name], MD   Recommendations at discharge:   Follow-up with PCP within a week with repeat CBC/BMP Outpatient follow-up with gynecology at earliest convenience Follow-up in the ED if symptoms worsen or new appear  Hospital Course: 71 y.o. female with medical history significant for T2DM, HTN, HLD, iron deficiency anemia, umbilical hernia was admitted for evaluation of abdominal ascites and AKI.  She underwent paracentesis and removal of 2.8 L fluid: Pathology pending.  GI recommended follow-up with gynecology.  Subsequently, she has remained stable.  She will be discharged home today with outpatient follow-up with PCP and gynecology.  Assessment and Plan: Abdominal ascites -Possibly related to her gynecological pathology.  Status post removal of 2.8 L fluid on 12/29/2021.  GI recommended follow-up with gynecology. -Currently stable.  Outpatient follow-up with PCP and gynecology.  Abnormal transvaginal ultrasound -Concern for gynecological/endometrial pathology.  Needs gynecology evaluation at earliest convenience  Acute kidney injury -Improving.  Continue to hold losartan upon discharge.  Resume chlorthalidone since this might help with the ascites as well.  Outpatient follow-up with PCP.  Keep metformin on hold as well.  Dyspnea -Questionable cause.  Patient has been started on prednisone.  Continue prednisone 40 mg daily for 7 days.  Use albuterol as needed.  Chest x-ray unremarkable. Outpatient follow-up with PCP  Essential hypertension -Continue Norvasc and chlorthalidone.  Losartan to remain on hold.  Outpatient follow-up.  Iron deficiency anemia -Continue supplementation.  Outpatient follow-up  Diabetes mellitus type 2 with hyperglycemia -Resume Victoza.  Metformin on hold.  Carb  modified diet.  A1c 6.7  Hyponatremia -Mild.  Outpatient follow-up  Leukocytosis -Possibly reactive from steroid use.  Outpatient follow-up.  Obesity -Outpatient follow-up     Consultants: GI Procedures performed: Ultrasound-guided paracentesis on 12/29/2021 Disposition: Home Diet recommendation:  Discharge Diet Orders (From admission, onward)     Start     Ordered   12/31/21 0000  Diet - low sodium heart healthy        12/31/21 0950   12/31/21 0000  Diet Carb Modified        12/31/21 0950           Carb modified diet DISCHARGE MEDICATION: Allergies as of 12/31/2021       Reactions   Codeine Sulfate Nausea And Vomiting, Other (See Comments)   Dizziness   Penicillins Nausea And Vomiting   Percocet [oxycodone-acetaminophen] Nausea And Vomiting   Codeine Diarrhea   Lisinopril Cough        Medication List     STOP taking these medications    losartan 100 MG tablet Commonly known as: COZAAR   metFORMIN 1000 MG tablet Commonly known as: GLUCOPHAGE       TAKE these medications    albuterol 108 (90 Base) MCG/ACT inhaler Commonly known as: VENTOLIN HFA Inhale 2 puffs into the lungs every 4 (four) hours as needed for wheezing or shortness of breath.   amLODipine 10 MG tablet Commonly known as: NORVASC Take 1 tablet (10 mg total) by mouth daily. What changed: when to take this   atorvastatin 20 MG tablet Commonly known as: LIPITOR Take 1 tablet (20 mg total) by mouth daily.   chlorthalidone 25 MG tablet Commonly known as: HYGROTON Take 1 tablet by mouth once daily What changed: when to take this  Cholecalciferol 25 MCG (1000 UT) capsule Take 1 capsule (1,000 Units total) by mouth daily.   dorzolamide-timolol 22.3-6.8 MG/ML ophthalmic solution Commonly known as: COSOPT Place 1 drop into both eyes 2 (two) times daily.   ferrous sulfate 325 (65 FE) MG tablet Take 325 mg by mouth daily with breakfast.   glucose blood test strip Commonly known  as: ONE TOUCH ULTRA TEST Use as instructed   Insulin Pen Needle 32G X 4 MM Misc 1 each by Does not apply route daily.   latanoprost 0.005 % ophthalmic solution Commonly known as: XALATAN Place 1 drop into both eyes at bedtime.   ondansetron 4 MG tablet Commonly known as: ZOFRAN Take 1 tablet (4 mg total) by mouth every 6 (six) hours as needed for nausea.   ONE TOUCH ULTRA MINI w/Device Kit Please use as directed.   OneTouch Delica Lancets 03U Misc Please use as directed.   predniSONE 20 MG tablet Commonly known as: DELTASONE Take 2 tablets (40 mg total) by mouth daily with breakfast.   traMADol 50 MG tablet Commonly known as: ULTRAM Take 1 tablet (50 mg total) by mouth every 6 (six) hours as needed for moderate pain.   Victoza 18 MG/3ML Sopn Generic drug: liraglutide PLEASE START TAKING VICTOZA 0.6MG DAILY WEEK 1, THEN TAKE 1.2MG DAILY WEEK 2, AND THEN TAKE 1.8MG DAILY THEREAFTER What changed:  how much to take how to take this when to take this additional instructions        Follow-up Information     [employer]. Call.   Why: Call [phone] on Monday to find out what OBGYN are in network with your insurance and scheduled an after hospital visit. Contact information: [phone]        [name], MD. Schedule an appointment as soon as possible for a visit in 1 week(s).   Specialty: Internal Medicine Contact information: [address] [place] [address] [phone]         Gynecologist Follow up.   Why: At earliest convenience               Subjective: Patient seen and examined at bedside.  Feels okay going home today.  Complains of intermittent abdominal pain.  No fever, vomiting or chest pain reported.  Still short of breath with mild exertion.    Discharge Exam: Filed Weights   12/30/21 2130  Weight: 92 kg   Vitals:   12/31/21 0652 12/31/21 0830  BP: (!) 147/71   Pulse: 89   Resp: 18   Temp:    SpO2:  (!) 89% 94%    General: On room air.  No distress.  Currently on room air. respiratory: Decreased breath sounds at bases bilaterally CVS: Currently rate controlled; S1-S2 heard  abdominal: Soft, obese, nontender, slightly distended, no organomegaly, normal bowel sounds are heard  extremities: No cyanosis, clubbing; trace lower extremity edema  Condition at discharge: fair  The results of significant diagnostics from this hospitalization (including imaging, microbiology, ancillary and laboratory) are listed below for reference.   Imaging Studies: CT ABDOMEN PELVIS WO CONTRAST  Result Date: 12/28/2021 CLINICAL DATA:  Acute abdominal pain.  Known hernia. EXAM: CT ABDOMEN AND PELVIS WITHOUT CONTRAST TECHNIQUE: Multidetector CT imaging of the abdomen and pelvis was performed following the standard protocol without IV contrast. RADIATION DOSE REDUCTION: This exam was performed according to the departmental dose-optimization program which includes automated exposure control, adjustment of the mA and/or kV according to patient size and/or use of iterative  reconstruction technique. COMPARISON:  Ultrasound abdomen 10/18/2021. FINDINGS: Lower chest: There is minimal atelectasis in the left lung base. Hepatobiliary: No focal liver abnormality is seen. No gallstones, gallbladder wall thickening, or biliary dilatation. Pancreas: Unremarkable. No pancreatic ductal dilatation or surrounding inflammatory changes. Spleen: Normal in size without focal abnormality. Adrenals/Urinary Tract: Adrenal glands are unremarkable. Kidneys are normal, without renal calculi, focal lesion, or hydronephrosis. Bladder is unremarkable. Stomach/Bowel: Stomach is within normal limits. No evidence of bowel wall thickening, distention, or inflammatory changes. There is diffuse colonic diverticulosis without evidence for diverticulitis. The appendix is not visualized. Vascular/Lymphatic: No significant vascular findings are present. No  enlarged abdominal or pelvic lymph nodes. Reproductive: Uterus and bilateral adnexa are unremarkable. There are surgical clips in the bilateral adnexa. Other: There is large volume ascites in all 4 abdominal quadrants. There is no free intraperitoneal air. There is a small umbilical hernia containing ascites. There is also some haziness of omental fat in the anterior abdomen. Musculoskeletal: No fracture is seen. No focal osseous lesion identified. IMPRESSION: 1. Large volume ascites. 2. Small umbilical hernia containing fat and ascites. 3. Haziness of anterior omentum most likely related to ascites, other etiologies such as metastatic disease can not be excluded. 4. Diffuse colonic diverticulosis without evidence for diverticulitis. Electronically Signed   By: Ronney Asters M.D.   On: 12/28/2021 15:12   DG Chest 2 View  Result Date: 12/28/2021 CLINICAL DATA:  Chest pain EXAM: CHEST - 2 VIEW COMPARISON:  11/02/2021 FINDINGS: Cardiac shadow is within normal limits. Mild vascular prominence is noted with stable vessel on end in the left mid lung. Lungs are well aerated bilaterally. No focal infiltrate or sizable effusion is seen. Degenerative changes of the thoracic spine are noted. IMPRESSION: No acute abnormality noted. Electronically Signed   By: Inez Catalina M.D.   On: 12/28/2021 20:21   US Paracentesis  Result Date: 12/29/2021 INDICATION: Patient with abdominal pain, distension, endometrial thickening on imaging, new ascites. Request is made for diagnostic and therapeutic paracentesis of 5 L maximum. EXAM: ULTRASOUND GUIDED DIAGNOSTIC AND THERAPEUTIC PARACENTESIS MEDICATIONS: 10 mL 1% lidocaine COMPLICATIONS: None immediate. PROCEDURE: Informed written consent was obtained from the patient after a discussion of the risks, benefits and alternatives to treatment. A timeout was performed prior to the initiation of the procedure. Initial ultrasound scanning demonstrates a moderate amount of ascites within the  right lower abdominal quadrant. The right lower abdomen was prepped and draped in the usual sterile fashion. 1% lidocaine was used for local anesthesia. Following this, a 19 gauge, 10-cm, Yueh catheter was introduced. An ultrasound image was saved for documentation purposes. The paracentesis was performed. The catheter was removed and a dressing was applied. The patient tolerated the procedure well without immediate post procedural complication. FINDINGS: A total of approximately 2.8 liters of yellow, cloudy fluid was removed. Samples were sent to the laboratory as requested by the clinical team. IMPRESSION: Successful ultrasound-guided paracentesis yielding 2.8 liters of peritoneal fluid. Read by: Brynda Greathouse PA-C Electronically Signed   By: Albin Felling M.D.   On: 12/29/2021 13:56   DG CHEST PORT 1 VIEW  Result Date: 12/30/2021 CLINICAL DATA:  Dyspnea. EXAM: PORTABLE CHEST 1 VIEW COMPARISON:  12/28/2021 FINDINGS: Stable cardiomediastinal contours. Lung volumes are low. No pleural effusion or edema. No airspace opacities identified. Visualized osseous structures are unremarkable. IMPRESSION: No acute cardiopulmonary abnormalities. Electronically Signed   By: Kerby Moors M.D.   On: 12/30/2021 10:12   US PELVIC COMPLETE WITH TRANSVAGINAL  Result  Date: 12/28/2021 CLINICAL DATA:  71 year old with endometrial thickening on ultrasound. Abdominal ascites. EXAM: TRANSABDOMINAL AND TRANSVAGINAL ULTRASOUND OF PELVIS TECHNIQUE: Both transabdominal and transvaginal ultrasound examinations of the pelvis were performed. Transabdominal technique was performed for global imaging of the pelvis including uterus, ovaries, adnexal regions, and pelvic cul-de-sac. It was necessary to proceed with endovaginal exam following the transabdominal exam to visualize the uterus and endometrium. COMPARISON:  Pelvic ultrasound 10/12/2021. Abdominopelvic CT earlier today. FINDINGS: Uterus Measurements: 7.9 x 5 x 5.2 cm = volume: 107  mL. The uterus is anteverted. No fibroids or other mass visualized. Endometrium Thickness: 22-36 mm, heterogeneous and complex. Probable endometrial fluid. There is some endometrial vascularity. Right ovary Not visualized. Left ovary Not visualized. Other findings Pelvic ascites, also seen on recent CT. IMPRESSION: 1. Persistent abnormal appearance of the endometrium, heterogeneously thickened and complex with possible intraluminal fluid. This is similar to that seen on December 2022 ultrasound. Endometrial thickness is considered abnormal for an asymptomatic post-menopausal female. Endometrial sampling should be considered to exclude carcinoma. 2. Nonvisualization of the ovaries. Electronically Signed   By: Narda Rutherford M.D.   On: 12/28/2021 21:02    Microbiology: Results for orders placed or performed during the hospital encounter of 12/28/21  Culture, body fluid w Gram Stain-bottle     Status: None (Preliminary result)   Collection Time: 12/29/21 11:18 AM   Specimen: Peritoneal Washings  Result Value Ref Range Status   Specimen Description PERITONEAL FLUID  Final   Special Requests NONE  Final   Culture   Final    NO GROWTH 2 DAYS Performed at [facility], [address], [place], [place] [address]    Report Status PENDING  Incomplete  Gram stain     Status: None   Collection Time: 12/29/21 11:18 AM   Specimen: Peritoneal Washings  Result Value Ref Range Status   Specimen Description PERITONEAL FLUID  Final   Special Requests NONE  Final   Gram Stain   Final    WBC PRESENT,BOTH PMN AND MONONUCLEAR NO ORGANISMS SEEN CYTOSPIN SMEAR Performed at [facility], [address], [place], [place] [address]    Report Status 12/29/2021 FINAL  Final  Culture, blood (routine x 2)     Status: None (Preliminary result)   Collection Time: 12/30/21  3:35 PM   Specimen: BLOOD  Result Value Ref Range Status   Specimen Description   Final    BLOOD RIGHT ANTECUBITAL Performed at  [facility], [address], [place], [place] [address]    Special Requests   Final    BOTTLES DRAWN AEROBIC AND ANAEROBIC Blood Culture adequate volume Performed at [facility], [address] [place], [place] [address]    Culture   Final    NO GROWTH < 24 HOURS Performed at [facility], [address], [place], [place] [address]    Report Status PENDING  Incomplete  Culture, blood (routine x 2)     Status: None (Preliminary result)   Collection Time: 12/30/21  3:35 PM   Specimen: BLOOD  Result Value Ref Range Status   Specimen Description   Final    BLOOD LEFT ANTECUBITAL Performed at [facility], [address], [place], [place] [address]    Special Requests   Final    BOTTLES DRAWN AEROBIC AND ANAEROBIC Blood Culture adequate volume Performed at [facility], [address], [place], [place] [address]  Culture   Final    NO GROWTH < 24 HOURS Performed at [facility], [address], [place], [place] [address]    Report Status PENDING  Incomplete    Labs: CBC: Recent Labs  Lab 12/28/21 1222 12/29/21 0540 12/30/21 0303 12/31/21 0311  WBC 15.7* 15.3* 15.1* 18.4*  HGB 10.4* 10.4* 10.3* 10.0*  HCT 33.3* 32.6* 32.5* 32.3*  MCV 76.0* 75.1* 76.1* 76.2*  PLT 756* 787* 636* 254*   Basic Metabolic Panel: Recent Labs  Lab 12/28/21 1222 12/29/21 0540 12/30/21 0303 12/31/21 0311  NA 133* 133* 135 133*  K 3.4* 3.5 3.6 3.8  CL 93* 95* 99 94*  CO2 _0 GLUCOSE 122* 136* 146* 203*  BUN 48* 50* 42* 44*  CREATININE 1.86* 1.75* 1.21* 1.40*  CALCIUM 9.2 9.3 8.7* 9.0   Liver Function Tests: Recent Labs  Lab 12/28/21 1222 12/29/21 0540  AST 10* 12*  ALT 10 11  ALKPHOS 100 114  BILITOT 0.2* 0.5  PROT 7.8 7.7  ALBUMIN 3.3* 3.1*   CBG: Recent Labs  Lab 12/30/21 0719 12/30/21 1114 12/30/21 1623 12/30/21 2009 12/31/21 0757  GLUCAP 145* 150* 176* 191* 173*     Discharge time spent: greater than 30 minutes.  Signed: [name], MD [facility] 12/31/2021

## 2021-12-31 NOTE — TOC Transition Note (Signed)
Transition of Care (TOC) - CM/SW Discharge Note ? ? ?Patient Details  ?Name: Sherri Gill ?MRN: [id number] ?Date of Birth: 1951/08/27 ? ?Transition of Care (TOC) CM/SW Contact:  ?Casidee Jann, LCSW ?Phone Number: ?12/31/2021, 3:06 PM ? ? ?Clinical Narrative:    ? ?Orders placed for Home O2 and HHPT follow up. Discussed with pt who is agreeable and has NO agency preferences.  O2 ordered with [employer] and portable tank to be delivered to room for travel home.  HHPT arranged with [facility].  Pt ready for dc today.   ? ?Final next level of care: [place] ?Barriers to Discharge: Barriers Resolved ? ? ?Patient Goals and CMS Choice ?Patient states their goals for this hospitalization and ongoing recovery are:: return home ?  ?  ? ?Discharge Placement ?  ?           ?  ?  ?  ?  ? ?Discharge Plan and Services ?  ?  ?           ?DME Arranged: Oxygen ?DME Agency: [employer] ?Date DME Agency Contacted: 12/31/21 ?Time DME Agency Contacted: 1829 ?Representative spoke with at DME Agency: Brenton Grills ?HH Arranged: PT ?[place] Agency: [place] ?Date HH Agency Contacted: 12/31/21 ?Time [place]: 1506 ?Representative spoke with at [place]: [place] ? ?Social Determinants of Health (SDOH) Interventions ?  ? ? ?Readmission Risk Interventions ?No flowsheet data found. ? ? ? ? ?

## 2021-12-31 NOTE — Plan of Care (Signed)
Problem: Education: ?Goal: Knowledge of General Education information will improve ?Description: Including pain rating scale, medication(s)/side effects and non-pharmacologic comfort measures ?Outcome: Progressing ?  ?Problem: Clinical Measurements: ?Goal: Ability to maintain clinical measurements within normal limits will improve ?Outcome: Progressing ?  ?Problem: Clinical Measurements: ?Goal: Respiratory complications will improve ?Outcome: Progressing ?  ?Ivan Anchors, RN ?12/31/21 ?8:45 AM ? ?

## 2022-01-01 ENCOUNTER — Telehealth: Payer: Self-pay | Admitting: *Deleted

## 2022-01-01 NOTE — Telephone Encounter (Signed)
Call transferred from front office after stating she's weak, feeling dizzy and is on oxygen. Pt stated she was discharged from [facility] hospital yesterday. Stated she had a paracentesis done. Still feeling weak and dizzy when she stands up. Stated she did not see her doctor nor any [facility] doctors while she was in the hospital- informed  her doctor may not had been aware since she was at [facility]. Stated she was instructed to see her doctor and GYN. I asked if she wanted to schedule an appt - stated not now since she just got out of the hospital and still feeling weak. Stated she called to inform her doctor of what was going on. ?

## 2022-01-01 NOTE — Telephone Encounter (Signed)
Pt called / informed of Dr [name]' response. Stated she wants to wait until tomorrow to schedule an appt here at the office if she's not feeling better. Also informed to go to the ED if she starts feeling worse- stated she understands. ?Chilon stated she has been trying since Jan to get pt scheduled with GYN; earliest is May/June. Chilon was able to get pt in sooner by calling [facility] office directly - appt schedule 02/05/22. ?

## 2022-01-02 LAB — URINE CULTURE: Culture: 10000 — AB

## 2022-01-03 LAB — CULTURE, BODY FLUID W GRAM STAIN -BOTTLE: Culture: NO GROWTH

## 2022-01-03 LAB — CYTOLOGY - NON PAP

## 2022-01-04 LAB — CULTURE, BLOOD (ROUTINE X 2)
Culture: NO GROWTH
Culture: NO GROWTH
Special Requests: ADEQUATE
Special Requests: ADEQUATE

## 2022-01-07 DIAGNOSIS — K219 Gastro-esophageal reflux disease without esophagitis: Secondary | ICD-10-CM | POA: Diagnosis not present

## 2022-01-07 DIAGNOSIS — Z9981 Dependence on supplemental oxygen: Secondary | ICD-10-CM | POA: Diagnosis not present

## 2022-01-07 DIAGNOSIS — N179 Acute kidney failure, unspecified: Secondary | ICD-10-CM | POA: Diagnosis not present

## 2022-01-07 DIAGNOSIS — I10 Essential (primary) hypertension: Secondary | ICD-10-CM | POA: Diagnosis not present

## 2022-01-07 DIAGNOSIS — E871 Hypo-osmolality and hyponatremia: Secondary | ICD-10-CM | POA: Diagnosis not present

## 2022-01-07 DIAGNOSIS — G4700 Insomnia, unspecified: Secondary | ICD-10-CM | POA: Diagnosis not present

## 2022-01-07 DIAGNOSIS — E669 Obesity, unspecified: Secondary | ICD-10-CM | POA: Diagnosis not present

## 2022-01-07 DIAGNOSIS — Z87891 Personal history of nicotine dependence: Secondary | ICD-10-CM | POA: Diagnosis not present

## 2022-01-07 DIAGNOSIS — D72829 Elevated white blood cell count, unspecified: Secondary | ICD-10-CM | POA: Diagnosis not present

## 2022-01-07 DIAGNOSIS — R69 Illness, unspecified: Secondary | ICD-10-CM | POA: Diagnosis not present

## 2022-01-07 DIAGNOSIS — R188 Other ascites: Secondary | ICD-10-CM | POA: Diagnosis not present

## 2022-01-07 DIAGNOSIS — Z86010 Personal history of colonic polyps: Secondary | ICD-10-CM | POA: Diagnosis not present

## 2022-01-07 DIAGNOSIS — K5730 Diverticulosis of large intestine without perforation or abscess without bleeding: Secondary | ICD-10-CM | POA: Diagnosis not present

## 2022-01-07 DIAGNOSIS — Z9851 Tubal ligation status: Secondary | ICD-10-CM | POA: Diagnosis not present

## 2022-01-07 DIAGNOSIS — E785 Hyperlipidemia, unspecified: Secondary | ICD-10-CM | POA: Diagnosis not present

## 2022-01-07 DIAGNOSIS — D509 Iron deficiency anemia, unspecified: Secondary | ICD-10-CM | POA: Diagnosis not present

## 2022-01-07 DIAGNOSIS — Z79899 Other long term (current) drug therapy: Secondary | ICD-10-CM | POA: Diagnosis not present

## 2022-01-07 DIAGNOSIS — K429 Umbilical hernia without obstruction or gangrene: Secondary | ICD-10-CM | POA: Diagnosis not present

## 2022-01-07 DIAGNOSIS — E236 Other disorders of pituitary gland: Secondary | ICD-10-CM | POA: Diagnosis not present

## 2022-01-07 DIAGNOSIS — Z7952 Long term (current) use of systemic steroids: Secondary | ICD-10-CM | POA: Diagnosis not present

## 2022-01-07 DIAGNOSIS — E1165 Type 2 diabetes mellitus with hyperglycemia: Secondary | ICD-10-CM | POA: Diagnosis not present

## 2022-01-07 DIAGNOSIS — E1142 Type 2 diabetes mellitus with diabetic polyneuropathy: Secondary | ICD-10-CM | POA: Diagnosis not present

## 2022-01-07 DIAGNOSIS — Z6833 Body mass index (BMI) 33.0-33.9, adult: Secondary | ICD-10-CM | POA: Diagnosis not present

## 2022-01-08 ENCOUNTER — Telehealth: Payer: Self-pay

## 2022-01-08 NOTE — Telephone Encounter (Signed)
?  Chronic Care Management  ? ?Note ? ?01/08/2022 ?Name: Sherri Gill MRN: [id number] DOB: November 08, 1950 ? ?Received call from patient to discuss her recent hospitalization. She is confused about what medications she should be taking.  Patient in need of follow up in clinic; appointment made for Thursday, 01/10/22 at 3:15 with Dr. Johnney Ou.  Called patient back  and notified of date/time for appointment and which physician she would be seeing. ? ?[name], RN, BSN, CCM ?Care Management Coordinator ?[facility] ?Phone: [phone]/DHD: [phone]   ?

## 2022-01-08 NOTE — Telephone Encounter (Signed)
Pt called stated that she spoke to glenda and chilon about her appt that was to set for her .. I explained chilon does referrals but she is out today and I can send her a chat to her desk so when she comes in an logs on she will see it and call her back ... pt then stated  she has a question about her medication  and that we dont never answer questions directly  or she never receives a call back or can never get a appt with who she is to see .[name] but that lady that use to answer the phone but we let her go and gave some one else that job  who is she .. I replied mrs Weng what is it I can help you with I am not into the he said she said pt then got upset of why I cant tell her who was that and who replaced her ...   ?

## 2022-01-10 ENCOUNTER — Ambulatory Visit (INDEPENDENT_AMBULATORY_CARE_PROVIDER_SITE_OTHER): Payer: Medicare HMO | Admitting: Internal Medicine

## 2022-01-10 VITALS — BP 146/76 | HR 100 | Temp 98.5°F | Ht 63.0 in | Wt 184.6 lb

## 2022-01-10 DIAGNOSIS — Z86010 Personal history of colonic polyps: Secondary | ICD-10-CM

## 2022-01-10 DIAGNOSIS — R180 Malignant ascites: Secondary | ICD-10-CM

## 2022-01-10 DIAGNOSIS — R0989 Other specified symptoms and signs involving the circulatory and respiratory systems: Secondary | ICD-10-CM | POA: Diagnosis not present

## 2022-01-10 DIAGNOSIS — K429 Umbilical hernia without obstruction or gangrene: Secondary | ICD-10-CM | POA: Diagnosis not present

## 2022-01-10 DIAGNOSIS — C55 Malignant neoplasm of uterus, part unspecified: Secondary | ICD-10-CM

## 2022-01-10 DIAGNOSIS — N179 Acute kidney failure, unspecified: Secondary | ICD-10-CM | POA: Diagnosis not present

## 2022-01-10 DIAGNOSIS — D509 Iron deficiency anemia, unspecified: Secondary | ICD-10-CM

## 2022-01-10 NOTE — Progress Notes (Signed)
Sherri Gill is here for Gill follow-up, having been admitted to [facility] from 12/28/21 to 12/31/21 under the care of the hospitalist team.  She presented to the ED there on the recommendation of her general surgeon with whom she had a consultation appointment to evaluate her periumbilical hernia.  He recognized that the hernia was not the cause of her more significant symptoms of increasing abdominal distention and severe pain together with inability eat, and excessive burping and belching, and urged her to seek emergent attention.  At the time, she was not aware that her [facility] physicians do not provide care at [facility].  She was found to have marked ascites in the absence of liver disease and diagnostic and therapeutic paracentesis (2.8L) was performed.  Repeat pelvic and vaginal ultrasound showed confirmation of endometrial fluid and thickening of the lining similar to findings in 09/2021.  It appears that the ascites cytology  resulted while she was admitted, but she was unaware of any results, and it was her impression that she was suffering merely from a fluid collection, dehydration, and iron deficient anemia. ? ?She has had no postmenopausal vaginal bleeding, and is experiencing no pelvic pain or pressure.  No dysuria and no problems with bowels.  She felt significant improvement following paracentesis and upon initiation of prednisone prescribed for an inflammatory component; she has been able to increase her oral intake and no longer is feeling shortness of breath or wheezing.  The burping and belching has largely resolved as well.  HHPT frequency has reduced from twice a week to weekly now that she has improved her mobility and strength.  She is no longer on oxygen. ? ?BP (!) 146/76 (BP Location: Right Arm, Patient Position: Sitting, Cuff Size: Small)   Pulse 100   Temp 98.5 ?F (36.9 ?C) (Oral)   Ht '5\' 3"'$  (1.6 m)   Wt 184 lb 9.6 oz (83.7 kg)   SpO2 100%   BMI 32.70 kg/m?  ? ?Endorses  feeling very anxious and worried but is commendably maintaining calm during this stressful time.  She is breathing comfortably on RA, no wheezing.  Abdomen remains distended from her baseline; her hernia is not protruding.  Skin is warm and dry, she has good color in her face and normal peripheral perfusion.   ? ?A/P: ?Gilbert and I reviewed her hospitalization and testing results in detail.  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  There are no lesions of adnexae and the uterus itself appears normal on CT imaging.  She has no liver disease. ? ?She had a preexisting first gyn consultation appointment with Sherri Gill at [facility] the first week of [facility] contacted them by phone today to see if her appointment could be moved up, but upon consultation with their specialist, it was recommended that we change our referral to [facility] oncology gynecologist [facility].  Fortunately we were able to get an appointment scheduled with Sherri Gill for [facility] Gill next Monday. ? ?Sherri Gill is appropriately tearful and worried.  I encouraged her to call her good friend from the room, and I shared the information that I know thus far with this friend who will be with Sherri Gill later this evening and will continue to support her each day. ? ?I wish that we had  been able to expedite Sherri Gill's evaluation which began in December.  There has been difficulty in establishing GYN referral which was initiated in 09/2021.  In retrospect, her umbilical hernia was probably worsening due to increased intra-abdominal pressure from her building ascites, which also would have explained her shortness of breath and wheezing (though chest x-rays have been normal). ? ?F/u AKI on bmp today, and f/u Fe-deficiency ? ? ? ? ? ? ?

## 2022-01-10 NOTE — Patient Instructions (Addendum)
Sherri Gill, ? ?We talked about the results found on the fluid removed from your abdomen, that show a cancer, though we don't know the source at this time.  This is such difficult news to hear, though we will put our trust in our experts and lean on our good friends! ? ?Your appointment: ? ?Dr. Jeral Pinch ?Monday, March 20 at 11:15 am (please be there 10:45). ?[facility]/oncology. ? ?Their office will reach out to you by phone to review your upcoming appointment. ? ?I am hear for you to talk on the phone, help explain complicated things, or answer questions.  [name] and I can each see your medical record so that we can coordinate care. ? ?Let's do this! ? ?My best, ?Dr. [name] ?Dr. [name] ?

## 2022-01-11 ENCOUNTER — Telehealth: Payer: Self-pay | Admitting: Oncology

## 2022-01-11 LAB — BMP8+ANION GAP
Anion Gap: 18 mmol/L (ref 10.0–18.0)
BUN/Creatinine Ratio: 13 (ref 12–28)
BUN: 13 mg/dL (ref 8–27)
CO2: 26 mmol/L (ref 20–29)
Calcium: 9.7 mg/dL (ref 8.7–10.3)
Chloride: 95 mmol/L — ABNORMAL LOW (ref 96–106)
Creatinine, Ser: 0.99 mg/dL (ref 0.57–1.00)
Glucose: 126 mg/dL — ABNORMAL HIGH (ref 70–99)
Potassium: 4.4 mmol/L (ref 3.5–5.2)
Sodium: 139 mmol/L (ref 134–144)
eGFR: 62 mL/min/{1.73_m2} (ref >59)

## 2022-01-11 LAB — IRON,TIBC AND FERRITIN PANEL
Ferritin: 260 ng/mL — ABNORMAL HIGH (ref 15–150)
Iron Saturation: 13 % — ABNORMAL LOW (ref 15–55)
Iron: 36 ug/dL (ref 27–139)
Total Iron Binding Capacity: 269 ug/dL (ref 250–450)
UIBC: 233 ug/dL (ref 118–369)

## 2022-01-11 MED ORDER — TRAMADOL HCL 50 MG PO TABS: 50.0000 mg | ORAL_TABLET | Freq: Four times a day (QID) | ORAL | 0 refills | Status: DC | PRN

## 2022-01-11 NOTE — Assessment & Plan Note (Signed)
CXR was ultimately negative; symptoms were secondary to malignant ascites, and resolved upon therapeutic paracentesis early 12/2021. ?

## 2022-01-11 NOTE — Assessment & Plan Note (Signed)
Repeat bmp shows resolution of AKI.   ?

## 2022-01-11 NOTE — Assessment & Plan Note (Signed)
Referred for surveillance colonoscopy in 2022 ?

## 2022-01-11 NOTE — Telephone Encounter (Signed)
[facility] and advised her of new patient appointment with Dr. Alvy Bimler on 01/15/22 at 8:40.  She verbalized understanding and agreement. ?

## 2022-01-11 NOTE — Assessment & Plan Note (Signed)
2.9 L therapeutic and diagnostic paracentesis.  Cytology adenocarcinoma, high grade serious carcinoma.  Appt with gyn onc 01/14/22. ?

## 2022-01-11 NOTE — Assessment & Plan Note (Signed)
Evaluated by general surgeon; no surgical intervention needed.  The developing ascites from unrelated problem was exaggerating symptoms.   ?

## 2022-01-13 DIAGNOSIS — R112 Nausea with vomiting, unspecified: Secondary | ICD-10-CM

## 2022-01-13 HISTORY — DX: Nausea with vomiting, unspecified: R11.2

## 2022-01-13 NOTE — H&P (View-Only) (Signed)
GYNECOLOGIC ONCOLOGY NEW PATIENT CONSULTATION  ? ?Patient Name: Sherri Gill  ?Patient Age: 71 y.o. ?Date of Service: 01/13/2022 ?Referring Provider: [name], MD ?[address] ?[place],  [place] [id number]  ? ?Primary Care Provider: [name], MD ?Consulting Provider: Jeral Pinch, MD  ? 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 ? ?I reviewed with the patient findings from her CT as well as cytology from paracentesis. While there is no comment that IHC supports gyn primary, given markedly thickened endometrium, I think this is the most likely source. I have reached out to pathology to ask if an addendum can be made regarding IHC findings.  ? ?I am concerned that she has some recollection of ascites as well as possible omental disease based on my exam today. Her CT during recent admission was performed without IV contrast, limited its utility.   ? ?I recommended EMB today to aid in establishing her diagnosis. Unfortunately, there was not visible os. Thus, we will proceed with scheduling endometrial sampling under anesthesia.  ? ?I suspect that the patient's episode of emesis was caused by medication use this morning. It seemed limited and she had immediate relief of symptoms after emesis. She is endorses regular bowel function and does not appear clinically to have a partial bowel obstruction. We dicussed signs and symptoms that should prompt her to present to the ER for evaluation.  ? ?We will plan to get a CA-125 and CEA with her pre-operative labs tomorrow. ? ?We will plan for EUA, dilation and curretage, possible hysteroscopy, possible ultrasound guidance, and any other indicated procedures on 01/15/22.  Discussed risks of surgery which include but are not limited to bleeding, need for transfusion, infection, uterine perforation, damage to surrounding structures requiring repair, anesthesia complications, VTE, and rarely  death. ? ?If uterine adenocarcinoma is confirmed on biopsy, we discussed recommendation to proceed with neoadjuvant chemotherapy given what is suspected to be advanced disease. She is scheduled to see Dr. Alvy Gill tomorrow, 3/21, for further discussion of systemic therapy. Plan would be to reimage after 3 cycles of chemotherapy to assess disease response and readiness for interval surgery.  ? ?If pathology confirms serous histology, will add HER2 testing.  ? ?A copy of this note was sent to the patient's referring provider.  ? ?75 minutes of total time was spent for this patient encounter, including preparation, face-to-face counseling with the patient and coordination of care, and documentation of the encounter. ? ? ?Sherri Pinch, MD  ?Division of Gynecologic Oncology  ?Department of Obstetrics and Gynecology  ?[facility]  ?___________________________________________  ?Chief Complaint: ?Chief Complaint  ?Patient presents with  ? Endometrial thickening on ultrasound  ? Endometrial/uterine adenocarcinoma ([place])  ? ? ?History of Present Illness:  ?Sherri Gill is a 71 y.o. y.o. female who is seen in consultation at the request of Sherri Pou, MD for an evaluation of metastatic adenocarcinoma in the setting of thickened endometrium. ? ?Patient was initially seen in early December with abdominal pain.  Her exam at that time was not consistent with pain related to her umbilical hernia plan was made to get an abdominal ultrasound.  She also endorsed pelvic pressure symptoms and urinary urgency and pelvic ultrasound was recommended (patient declined at the time).  Ultimately did undergo pelvic ultrasound in mid December and given findings of endometrial thickness, referral was placed for evaluation with OB/GYN.  She was also referred to general surgery given her symptomatic umbilical  hernia.  Saw Sherri Gill on 3/3 and given significant abdominal symptoms, he recommended that she  go to the emergency department for further work-up.  She was found to have abdominal ascites and AKI on presentation.  She underwent paracentesis with almost 3 L of fluid removed.  GI was consulted and recommended follow-up with gynecology.  AKI improved during her hospitalization.  Cytology from her paracentesis ultimately returned showing adenocarcinoma. ? ?Today, the patient endorses that beginning in January, she noticed increasing abdominal girth, decreased appetite (would feel full after 2 crackers), and fatigue.  She is retired but has a part-time job.  While she was at work, people would often comment to her that it looked like she was grimacing or in pain.  She describes feeling as though something were "pumping air into her abdomen".  For some time, she thought symptoms were related to her hernia.  She found that it was difficult to stand up from a seated position or to turn over in bed.  She began sleeping on pillows to move more easily in bed.  She noticed increasingly frequent belching.  Prior to her hospitalization, she was spending a lot of time in bed when she was not at work.  She would use a heating pad alternating with ice packs.  Heating pad would provide some relief.  She will try to avoid taking ibuprofen or Tylenol and less necessary. ? ?She has emesis on presentation today.  She thinks this is related to taking her big toe so this morning without food in her stomach followed by eating a yogurt.  She denies any nausea or emesis with the exception of the Wednesday night before going into the hospital (had clear emesis at that time).  Her appetite is slowly improving since her paracentesis.  She denies any pain today.  Had some pelvic pain on Saturday after eating some bagel and eggs.  She started to feel dizzy at this time. ? ?Patient started Sherri Gill over a year ago.  She had some gradual weight loss on the medication but more recently has noted about a 10 pound decrease in her weight.  She  was constipated in the hospital secondary to medications but notes normal bowel function before her hospitalization and since.  She denies any urinary symptoms.  She denies any vaginal bleeding or discharge. ? ?Work-up thus far: ?10/12/2021: Pelvic ultrasound exam shows uterus measuring 9.6 x 5.8 x 5 cm with an endometrial thickness of 25 mm, heterogenous, suspected moderate complex fluid or hemorrhagic material within the canal.  Endometrial sampling recommended. ?10/18/2021: Abdominal ultrasound shows 2.5 x 2.2 x 2.3 cm mass at the level of the umbilicus worrisome for an umbilical hernia containing bowel.  Flow is identified within the region. ?12/28/2021: CT abdomen and pelvis without contrast in the setting of acute abdominal pain and known umbilical hernia reveals large volume ascites, small local hernia containing fat and ascites.  Haziness of anterior omentum most likely related to ascites although other etiologies such as metastatic disease cannot be excluded.  Diffuse colonic diverticulosis without evidence of diverticulitis noted. ?12/28/2021: Pelvic ultrasound exam reveals thickened endometrium measuring 22-33 mm, heterogenous and complex.  There is some endometrial vascularity. ?12/29/2021: Paracentesis performed with 2.8 L of yellow cloudy fluid removed. ?12/29/2021: Cytology from ascites shows malignant cells consistent with adenocarcinoma.  Tumor cells positive for CK7 and PAX8, negative for CK20, CDX2, and ER.  Diffusely positive for p53.  The suggest high-grade serous carcinoma. ? ?Patient lives with her son  who she describes as having some special needs.  He has schizophrenia.  She presents today with her cousin and a good friend. ? ?PAST MEDICAL HISTORY:  ?Past Medical History:  ?Diagnosis Date  ? AKI (acute kidney injury) ([place])   ? History of  ? Ascites   ? 09/2021 paracentesis with almost 3 L of fluid 12/2021 Paracentesis performed with 2.8 L  ? Chronic anemia 08/29/2012  ? Need colonoscopy or report.  Need iron panel.   ? Dyslipidemia 06/30/2007  ? Dyspnea   ? Essential hypertension, benign 06/30/2007  ? GERD 06/30/2007  ? History of blood transfusion   ? History of fatty infiltration of liver   ? History of

## 2022-01-13 NOTE — Progress Notes (Signed)
GYNECOLOGIC ONCOLOGY NEW PATIENT CONSULTATION   Patient Name: Sherri Gill  Patient Age: 71 y.o. Date of Service: 01/13/2022 Referring Provider: [name], MD [address] [place],  [place] [address]   Primary Care Provider: [name], MD Consulting Provider: Eugene Garnet, MD   Assessment/Plan:  Postmenopausal patient with advanced adenocarcinoma, suspected to be uterine.  I reviewed with the patient findings from her CT as well as cytology from paracentesis. While there is no comment that IHC supports gyn primary, given markedly thickened endometrium, I think this is the most likely source. I have reached out to pathology to ask if an addendum can be made regarding IHC findings.   I am concerned that she has some recollection of ascites as well as possible omental disease based on my exam today. Her CT during recent admission was performed without IV contrast, limited its utility.    I recommended EMB today to aid in establishing her diagnosis. Unfortunately, there was not visible os. Thus, we will proceed with scheduling endometrial sampling under anesthesia.   I suspect that the patient's episode of emesis was caused by medication use this morning. It seemed limited and she had immediate relief of symptoms after emesis. She is endorses regular bowel function and does not appear clinically to have a partial bowel obstruction. We dicussed signs and symptoms that should prompt her to present to the ER for evaluation.   We will plan to get a CA-125 and CEA with her pre-operative labs tomorrow.  We will plan for EUA, dilation and curretage, possible hysteroscopy, possible ultrasound guidance, and any other indicated procedures on 01/15/22.  Discussed risks of surgery which include but are not limited to bleeding, need for transfusion, infection, uterine perforation, damage to surrounding structures requiring repair, anesthesia complications, VTE, and rarely  death.  If uterine adenocarcinoma is confirmed on biopsy, we discussed recommendation to proceed with neoadjuvant chemotherapy given what is suspected to be advanced disease. She is scheduled to see Dr. Bertis Gill tomorrow, 3/21, for further discussion of systemic therapy. Plan would be to reimage after 3 cycles of chemotherapy to assess disease response and readiness for interval surgery.   If pathology confirms serous histology, will add HER2 testing.   A copy of this note was sent to the patient's referring provider.   75 minutes of total time was spent for this patient encounter, including preparation, face-to-face counseling with the patient and coordination of care, and documentation of the encounter.   Sherri Garnet, MD  Division of Gynecologic Oncology  Department of Obstetrics and Gynecology  [facility]  ___________________________________________  Chief Complaint: Chief Complaint  Patient presents with   Endometrial thickening on ultrasound   Endometrial/uterine adenocarcinoma [facility])    History of Present Illness:  Sherri Gill is a 71 y.o. y.o. female who is seen in consultation at the request of Sherri Aschoff, MD for an evaluation of metastatic adenocarcinoma in the setting of thickened endometrium.  Patient was initially seen in early December with abdominal pain.  Her exam at that time was not consistent with pain related to her umbilical hernia plan was made to get an abdominal ultrasound.  She also endorsed pelvic pressure symptoms and urinary urgency and pelvic ultrasound was recommended (patient declined at the time).  Ultimately did undergo pelvic ultrasound in mid December and given findings of endometrial thickness, referral was placed for evaluation with OB/GYN.  She was also referred to general surgery given her symptomatic umbilical  hernia.  Saw Sherri Gill on 3/3 and given significant abdominal symptoms, he recommended that she  go to the emergency department for further work-up.  She was found to have abdominal ascites and AKI on presentation.  She underwent paracentesis with almost 3 L of fluid removed.  GI was consulted and recommended follow-up with gynecology.  AKI improved during her hospitalization.  Cytology from her paracentesis ultimately returned showing adenocarcinoma.  Today, the patient endorses that beginning in January, she noticed increasing abdominal girth, decreased appetite (would feel full after 2 crackers), and fatigue.  She is retired but has a part-time job.  While she was at work, people would often comment to her that it looked like she was grimacing or in pain.  She describes feeling as though something were "pumping air into her abdomen".  For some time, she thought symptoms were related to her hernia.  She found that it was difficult to stand up from a seated position or to turn over in bed.  She began sleeping on pillows to move more easily in bed.  She noticed increasingly frequent belching.  Prior to her hospitalization, she was spending a lot of time in bed when she was not at work.  She would use a heating pad alternating with ice packs.  Heating pad would provide some relief.  She will try to avoid taking ibuprofen or Sherri Gill and less necessary.  She has emesis on presentation today.  She thinks this is related to taking her big toe so this morning without food in her stomach followed by eating a yogurt.  She denies any nausea or emesis with the exception of the Wednesday night before going into the hospital (had clear emesis at that time).  Her appetite is slowly improving since her paracentesis.  She denies any pain today.  Had some pelvic pain on Saturday after eating some bagel and eggs.  She started to feel dizzy at this time.  Patient started Sherri Gill over a year ago.  She had some gradual weight loss on the medication but more recently has noted about a 10 pound decrease in her weight.  She  was constipated in the hospital secondary to medications but notes normal bowel function before her hospitalization and since.  She denies any urinary symptoms.  She denies any vaginal bleeding or discharge.  Work-up thus far: 10/12/2021: Pelvic ultrasound exam shows uterus measuring 9.6 x 5.8 x 5 cm with an endometrial thickness of 25 mm, heterogenous, suspected moderate complex fluid or hemorrhagic material within the canal.  Endometrial sampling recommended. 10/18/2021: Abdominal ultrasound shows 2.5 x 2.2 x 2.3 cm mass at the level of the umbilicus worrisome for an umbilical hernia containing bowel.  Flow is identified within the region. 12/28/2021: CT abdomen and pelvis without contrast in the setting of acute abdominal pain and known umbilical hernia reveals large volume ascites, small local hernia containing fat and ascites.  Haziness of anterior omentum most likely related to ascites although other etiologies such as metastatic disease cannot be excluded.  Diffuse colonic diverticulosis without evidence of diverticulitis noted. 12/28/2021: Pelvic ultrasound exam reveals thickened endometrium measuring 22-33 mm, heterogenous and complex.  There is some endometrial vascularity. 12/29/2021: Paracentesis performed with 2.8 L of yellow cloudy fluid removed. 12/29/2021: Cytology from ascites shows malignant cells consistent with adenocarcinoma.  Tumor cells positive for CK7 and PAX8, negative for CK20, CDX2, and ER.  Diffusely positive for p53.  The suggest high-grade serous carcinoma.  Patient lives with her son  who she describes as having some special needs.  He has schizophrenia.  She presents today with her cousin and a good friend.  PAST MEDICAL HISTORY:  Past Medical History:  Diagnosis Date   AKI (acute kidney injury) (HCC)    History of   Ascites    09/2021 paracentesis with almost 3 L of fluid 12/2021 Paracentesis performed with 2.8 L   Chronic anemia 08/29/2012   Need colonoscopy or report.  Need iron panel.    Dyslipidemia 06/30/2007   Dyspnea    Essential hypertension, benign 06/30/2007   GERD 06/30/2007   History of blood transfusion    History of fatty infiltration of liver    History of migraine    Hx of Herpes simplex meningitis 2015   Also noted to have primary empty sella on imaging at this admission   Insomnia 06/11/2012   Major depressive disorder, recurrent episode, moderate with anxious distress (HCC) 06/30/2007   Nausea and vomiting 01/13/2022   Obesity, BMI 35-40 06/11/2012   Peripheral neuropathy 2/2 T2DM 12/28/2009   Primary empty sella syndrome (HCC) 2015   Noted on imaging during hospitalization for herpes meningitis; no pituitary mass, no hormone w/u at that time, hormonally asymptomatic   Type 2 diabetes mellitus with neurological complications (HCC) 06/30/2007     PAST SURGICAL HISTORY:  Past Surgical History:  Procedure Laterality Date   COLONOSCOPY W/ POLYPECTOMY  06/28/2004   DENTAL SURGERY     TUBAL LIGATION      OB/GYN HISTORY:  OB History  Gravida Para Term Preterm AB Living  2 2          SAB IAB Ectopic Multiple Live Births               # Outcome Date GA Lbr Len/2nd Weight Sex Delivery Anes PTL Lv  2 Para           1 Para             No LMP recorded. Patient is postmenopausal.  Age at menarche: 52 Age at menopause: In her late 78s Hx of HRT: Denies Hx of STDs: Denies Last pap: Unsure but thinks at least 5 years ago History of abnormal pap smears: Denies  SCREENING STUDIES:  Last mammogram: 2012  Last colonoscopy: 2015  MEDICATIONS: Outpatient Encounter Medications as of 01/14/2022  Medication Sig   albuterol (VENTOLIN HFA) 108 (90 Base) MCG/ACT inhaler Inhale 2 puffs into the lungs every 4 (four) hours as needed for wheezing or shortness of breath.   amLODipine (NORVASC) 10 MG tablet Take 1 tablet (10 mg total) by mouth daily. (Patient taking differently: Take 10 mg by mouth at bedtime.)   atorvastatin (LIPITOR) 20 MG  tablet Take 1 tablet (20 mg total) by mouth daily. (Patient taking differently: Take 20 mg by mouth at bedtime.)   Blood Glucose Monitoring Suppl (ONE TOUCH ULTRA MINI) w/Device KIT Please use as directed.   chlorthalidone (HYGROTON) 25 MG tablet Take 1 tablet by mouth once daily (Patient taking differently: Take 25 mg by mouth at bedtime.)   Cholecalciferol 1000 units capsule Take 1 capsule (1,000 Units total) by mouth daily.   dorzolamide-timolol (COSOPT) 22.3-6.8 MG/ML ophthalmic solution Place 1 drop into both eyes 2 (two) times daily.   ferrous sulfate 325 (65 FE) MG tablet Take 325 mg by mouth daily with breakfast.   glucose blood (ONE TOUCH ULTRA TEST) test strip Use as instructed   Insulin Pen Needle 32G X 4 MM MISC 1 each  by Does not apply route daily.   latanoprost (XALATAN) 0.005 % ophthalmic solution Place 1 drop into both eyes at bedtime.   liraglutide (VICTOZA) 18 MG/3ML SOPN PLEASE START TAKING VICTOZA 0.6MG  DAILY WEEK 1, THEN TAKE 1.2MG  DAILY WEEK 2, AND THEN TAKE 1.8MG  DAILY THEREAFTER (Patient taking differently: Inject 1.2 mg into the skin daily.)   ondansetron (ZOFRAN) 4 MG tablet Take 1 tablet (4 mg total) by mouth every 6 (six) hours as needed for nausea.   ONETOUCH DELICA LANCETS 33G MISC Please use as directed.   traMADol (ULTRAM) 50 MG tablet Take 1 tablet (50 mg total) by mouth every 6 (six) hours as needed for moderate pain.   No facility-administered encounter medications on file as of 01/14/2022.    ALLERGIES:  Allergies  Allergen Reactions   Codeine Sulfate Nausea And Vomiting and Other (See Comments)    Dizziness   Penicillins Nausea And Vomiting   Percocet [Oxycodone-Acetaminophen] Nausea And Vomiting   Codeine Diarrhea   Lisinopril Cough     FAMILY HISTORY:  Family History  Adopted: Yes  Problem Relation Age of Onset   Dementia Mother    Thyroid cancer Maternal Aunt    Prostate cancer Cousin      SOCIAL HISTORY:  Social Connections: Not on file     REVIEW OF SYSTEMS:  + Ringing in ears, abdominal pain, dizziness, problems with walking. Denies appetite changes, fevers, chills, fatigue, unexplained weight changes. Denies hearing loss, neck lumps or masses, mouth sores or voice changes. Denies cough or wheezing.  Denies shortness of breath. Denies chest pain or palpitations. Denies leg swelling. Denies abdominal distention, blood in stools, constipation, diarrhea, nausea, vomiting, or early satiety. Denies pain with intercourse, dysuria, frequency, hematuria or incontinence. Denies hot flashes, pelvic pain, vaginal bleeding or vaginal discharge.   Denies joint pain, back pain or muscle pain/cramps. Denies itching, rash, or wounds. Denies headaches, numbness or seizures. Denies swollen lymph nodes or glands, denies easy bruising or bleeding. Denies anxiety, depression, confusion, or decreased concentration.  Physical Exam:  Vital Signs for this encounter:  Blood pressure (!) 145/51, pulse 83, temperature 98.5 F (36.9 C), temperature source Oral, resp. rate 18, height 5' 4.57" (1.64 m), weight 184 lb (83.5 kg), SpO2 94 %. Body mass index is 31.03 kg/m. General: Alert, oriented, no acute distress.  HEENT: Normocephalic, atraumatic. Sclera anicteric.  Chest: Clear to auscultation bilaterally. Mild expiratory wheezes, no rhonchi, or rales. Cardiovascular: Regular rate and rhythm, no murmurs, rubs, or gallops.  Abdomen: Normoactive bowel sounds. Moderately distended and somewhat firm to palpation in upper abdomen. No tenderness to palpation. No hepatosplenomegaly appreciated. + palpable fluid wave.  Extremities: Grossly normal range of motion. Warm, well perfused. No edema bilaterally.  Skin: No rashes or lesions.  Lymphatics: No cervical, supraclavicular, or inguinal adenopathy.  GU:  Normal external female genitalia. No lesions. No discharge or bleeding.             Bladder/urethra:  No lesions or masses, well supported bladder              Vagina: mildly atrophic, no lesions.              Cervix: Normal appearing, no lesions. Cervical os not able to be visualized, atrophic. Attempted to use cytobrush and endometrial pipelle to identify cervix without success.              Uterus: Small, mobile, no definitive parametrial involvement.  Adnexa: No masses appreciated.  Rectal: Some nodularity along posterior cul de sac, better appreciated on vaginal exam.  LABORATORY AND RADIOLOGIC DATA:  Outside medical records were reviewed to synthesize the above history, along with the history and physical obtained during the visit.   Lab Results  Component Value Date   WBC 18.4 (H) 12/31/2021   HGB 10.0 (L) 12/31/2021   HCT 32.3 (L) 12/31/2021   PLT 741 (H) 12/31/2021   GLUCOSE 126 (H) 01/10/2022   CHOL 171 05/31/2021   TRIG 161 (H) 05/31/2021   HDL 65 05/31/2021   LDLCALC 79 05/31/2021   ALT 11 12/29/2021   AST 12 (L) 12/29/2021   NA 139 01/10/2022   K 4.4 01/10/2022   CL 95 (L) 01/10/2022   CREATININE 0.99 01/10/2022   BUN 13 01/10/2022   CO2 26 01/10/2022   TSH 3.950 05/31/2021   INR 1.2 12/29/2021   HGBA1C 6.7 (H) 12/29/2021   MICROALBUR 1.4 08/03/2014

## 2022-01-14 ENCOUNTER — Encounter: Payer: Self-pay | Admitting: Hematology and Oncology

## 2022-01-14 ENCOUNTER — Other Ambulatory Visit (HOSPITAL_COMMUNITY): Payer: Self-pay | Admitting: Gynecologic Oncology

## 2022-01-14 ENCOUNTER — Other Ambulatory Visit: Payer: Self-pay | Admitting: Gynecologic Oncology

## 2022-01-14 ENCOUNTER — Other Ambulatory Visit: Payer: Self-pay

## 2022-01-14 ENCOUNTER — Encounter: Payer: Self-pay | Admitting: Gynecologic Oncology

## 2022-01-14 ENCOUNTER — Encounter (HOSPITAL_COMMUNITY): Payer: Self-pay | Admitting: Gynecologic Oncology

## 2022-01-14 ENCOUNTER — Inpatient Hospital Stay (HOSPITAL_BASED_OUTPATIENT_CLINIC_OR_DEPARTMENT_OTHER): Payer: Medicare HMO | Admitting: Gynecologic Oncology

## 2022-01-14 ENCOUNTER — Inpatient Hospital Stay: Payer: Medicare HMO | Attending: Gynecologic Oncology | Admitting: Gynecologic Oncology

## 2022-01-14 VITALS — BP 145/51 | HR 83 | Temp 98.5°F | Resp 18 | Ht 64.57 in | Wt 184.0 lb

## 2022-01-14 DIAGNOSIS — E669 Obesity, unspecified: Secondary | ICD-10-CM | POA: Insufficient documentation

## 2022-01-14 DIAGNOSIS — C55 Malignant neoplasm of uterus, part unspecified: Secondary | ICD-10-CM | POA: Insufficient documentation

## 2022-01-14 DIAGNOSIS — E785 Hyperlipidemia, unspecified: Secondary | ICD-10-CM | POA: Insufficient documentation

## 2022-01-14 DIAGNOSIS — I10 Essential (primary) hypertension: Secondary | ICD-10-CM | POA: Insufficient documentation

## 2022-01-14 DIAGNOSIS — K219 Gastro-esophageal reflux disease without esophagitis: Secondary | ICD-10-CM | POA: Insufficient documentation

## 2022-01-14 DIAGNOSIS — Z6831 Body mass index (BMI) 31.0-31.9, adult: Secondary | ICD-10-CM | POA: Insufficient documentation

## 2022-01-14 DIAGNOSIS — R9389 Abnormal findings on diagnostic imaging of other specified body structures: Secondary | ICD-10-CM | POA: Insufficient documentation

## 2022-01-14 DIAGNOSIS — R180 Malignant ascites: Secondary | ICD-10-CM | POA: Diagnosis not present

## 2022-01-14 DIAGNOSIS — C801 Malignant (primary) neoplasm, unspecified: Secondary | ICD-10-CM | POA: Diagnosis not present

## 2022-01-14 DIAGNOSIS — C541 Malignant neoplasm of endometrium: Secondary | ICD-10-CM

## 2022-01-14 DIAGNOSIS — R102 Pelvic and perineal pain: Secondary | ICD-10-CM | POA: Insufficient documentation

## 2022-01-14 DIAGNOSIS — Z79899 Other long term (current) drug therapy: Secondary | ICD-10-CM | POA: Insufficient documentation

## 2022-01-14 DIAGNOSIS — Z7985 Long-term (current) use of injectable non-insulin antidiabetic drugs: Secondary | ICD-10-CM | POA: Insufficient documentation

## 2022-01-14 DIAGNOSIS — D509 Iron deficiency anemia, unspecified: Secondary | ICD-10-CM

## 2022-01-14 DIAGNOSIS — Z124 Encounter for screening for malignant neoplasm of cervix: Secondary | ICD-10-CM

## 2022-01-14 DIAGNOSIS — D75839 Thrombocytosis, unspecified: Secondary | ICD-10-CM

## 2022-01-14 DIAGNOSIS — Z794 Long term (current) use of insulin: Secondary | ICD-10-CM | POA: Insufficient documentation

## 2022-01-14 DIAGNOSIS — E1142 Type 2 diabetes mellitus with diabetic polyneuropathy: Secondary | ICD-10-CM | POA: Insufficient documentation

## 2022-01-14 DIAGNOSIS — F339 Major depressive disorder, recurrent, unspecified: Secondary | ICD-10-CM | POA: Insufficient documentation

## 2022-01-14 NOTE — Patient Instructions (Addendum)
Plan to proceed with your consultation with Dr. Alvy Bimler as scheduled for tomorrow am. Your procedure will be in the afternoon starting around 3pm. ? ?Preparing for your Surgery ? ?Plan for surgery on January 15, 2022 with Dr. Jeral Pinch at [facility] will be scheduled for examination under anesthesia, dilation and curettage of the uterus (dilating the cervix and taking a sample from the lining), possible hysteroscopy (using a camera), possible ultrasound guidance.  ? ?Pre-operative Testing ?-You will receive a phone call from presurgical testing at [facility] to discuss surgery instructions and arrange for lab work if needed. ? ?-Bring your insurance card, copy of an advanced directive if applicable, medication list. ? ?-You should not be taking blood thinners or aspirin at least ten days prior to surgery unless instructed by your surgeon. ? ?-Do not take supplements such as fish oil (omega 3), red yeast rice, turmeric before your surgery. You want to avoid medications with aspirin in them including headache powders such as BC or Goody's), Excedrin migraine. ? ?Day Before Surgery at Home ?-You will be advised you can have clear liquids up until 3 hours before your surgery.   ? ?Your role in recovery ?Your role is to become active as soon as directed by your doctor, while still giving yourself time to heal.  Rest when you feel tired. You will be asked to do the following in order to speed your recovery: ? ?- Cough and breathe deeply. This helps to clear and expand your lungs and can prevent pneumonia after surgery.  ?- STAY ACTIVE WHEN YOU GET HOME. Do mild physical activity. Walking or moving your legs help your circulation and body functions return to normal. Do not try to get up or walk alone the first time after surgery.   ?-If you develop swelling on one leg or the other, pain in the back of your leg, redness/warmth in one of your legs, please call the office or go to the Emergency  Room to have a doppler to rule out a blood clot. For shortness of breath, chest pain-seek care in the Emergency Room as soon as possible. ?- Actively manage your pain. Managing your pain lets you move in comfort. We will ask you to rate your pain on a scale of zero to 10. It is your responsibility to tell your doctor or nurse where and how much you hurt so your pain can be treated. ? ?Special Considerations ?-Your final pathology results from surgery should be available around one week after surgery and the results will be relayed to you when available. ? ?-FMLA forms can be faxed to [phone] and please allow 5-7 business days for completion. ? ?Pain Management After Surgery ?-Make sure that you have Tylenol and Ibuprofen at home to use on a regular basis after surgery for pain control. We recommend alternating the medications every hour to six hours since they work differently and are processed in the body differently for pain relief. ? ?Bowel Regimen ?-It is important to prevent constipation and drink adequate amounts of liquids.  ? ?Risks of Surgery ?Risks of surgery are low but include bleeding, infection, damage to surrounding structures, re-operation, blood clots, and very rarely death. ? ?AFTER SURGERY INSTRUCTIONS ? ?Return to work:  1-2 days if applicable ? ?Activity: ?1. Be up and out of the bed during the day.  Take a nap if needed.  You may walk up steps but be careful and use the hand rail.  Stair climbing  will tire you more than you think, you may need to stop part way and rest.  ? ?2. No lifting or straining for 2 weeks over 10 pounds. No pushing, pulling, straining for 2 weeks. ? ?3. No driving for minimum 24 hours after surgery.  Do not drive if you are taking narcotic pain medicine and make sure that your reaction time has returned.  ? ?4. You can shower as soon as the next day after surgery. Shower daily. No tub baths or submerging your body in water until cleared by your surgeon. If you have  the soap that was given to you by pre-surgical testing that was used before surgery, you do not need to use it afterwards because this can irritate your incisions.  ? ?5. No sexual activity and nothing in the vagina for 2-4 weeks. ? ?6. You may experience vaginal spotting and discharge after surgery.  The spotting is normal but if you experience heavy bleeding, call our office. ? ?7. Take Tylenol or ibuprofen for pain.  Monitor your Tylenol intake to a max of 4,000 mg in a 24 hour period.  ? ?Diet: ?1. Low sodium Heart Healthy Diet is recommended but you are cleared to resume your normal (before surgery) diet after your procedure. ? ?2. It is safe to use a laxative, such as Miralax or Colace, if you have difficulty moving your bowels. You can obtain senna-S over the counter and take two tablets at nighttime after surgery for the first few days to prevent straining to have a bowel movement. ? ?Wound Care: ?1. Keep clean and dry.  Shower daily. ? ?Reasons to call the Doctor: ?Fever - Oral temperature greater than 100.4 degrees Fahrenheit ?Foul-smelling vaginal discharge ?Difficulty urinating ?Nausea and vomiting ?Increased pain at the site of the incision that is unrelieved with pain medicine. ?Difficulty breathing with or without chest pain ?New calf pain especially if only on one side ?Sudden, continuing increased vaginal bleeding with or without clots. ?  ?Contacts: ?For questions or concerns you should contact: ? ?Dr. Jeral Pinch at [phone] ? ?[name], NP at [phone] ? ?After Hours: call [phone] and have the GYN Oncologist paged/contacted (after 5 pm or on the weekends). ? ?Messages sent via mychart are for non-urgent matters and are not responded to after hours so for urgent needs, please call the after hours number. ? ? ? ?  ?

## 2022-01-14 NOTE — Progress Notes (Signed)
For Short Stay: ?[place] appointment date: N/A ?Date of COVID positive in last 90 days: N/A ? ?Bowel Prep reminder: N/A ? ? ?For Anesthesia: ?PCP - [name], MD last office visit note 01/10/22 in epic ?Cardiologist - N/A ? ?Chest x-ray - 12/30/21 in epic ?EKG - 12/31/21 in epic ?Stress Test - greater than 2 year ?ECHO - greater than 2 year ?Cardiac Cath - N/A ?Pacemaker/ICD device last checked: N/A ?Pacemaker orders received: N/A ?Device Rep notified: N/A ? ?Spinal Cord Stimulator: N/A ? ?Sleep Study - N/A ?CPAP - N/A ? ?Fasting Blood Sugar - N/A ?Checks Blood Sugar __not currently checking because machine is broken__ times a day ?Date and result of last Hgb A1c- 6.7 01/01/22 in epic ? ?Blood Thinner Instructions: N/A ?Aspirin Instructions: N/A ?Last Dose: N/A ? ?Activity level: activities of daily living without stopping and without chest pain and/or shortness of breath ?   ?Anesthesia review: HTN, DM ? ?Patient denies shortness of breath, fever, cough and chest pain at PAT appointment ? ? ?Patient verbalized understanding of instructions that were given to them at the PAT appointment. Patient was also instructed that they will need to review over the PAT instructions again at home before surgery.  ?

## 2022-01-15 ENCOUNTER — Encounter (HOSPITAL_COMMUNITY)
Admission: RE | Disposition: A | Discharge status: Home / Self Care | Payer: Self-pay | Source: Ambulatory Visit | Attending: Gynecologic Oncology

## 2022-01-15 ENCOUNTER — Encounter: Payer: Self-pay | Admitting: Hematology and Oncology

## 2022-01-15 ENCOUNTER — Other Ambulatory Visit: Payer: Self-pay

## 2022-01-15 ENCOUNTER — Encounter: Payer: Self-pay | Admitting: Oncology

## 2022-01-15 ENCOUNTER — Other Ambulatory Visit (HOSPITAL_COMMUNITY): Payer: Self-pay

## 2022-01-15 ENCOUNTER — Ambulatory Visit (HOSPITAL_BASED_OUTPATIENT_CLINIC_OR_DEPARTMENT_OTHER): Payer: Medicare HMO | Admitting: Physician Assistant

## 2022-01-15 ENCOUNTER — Encounter (HOSPITAL_COMMUNITY): Payer: Self-pay | Admitting: Gynecologic Oncology

## 2022-01-15 ENCOUNTER — Ambulatory Visit (HOSPITAL_COMMUNITY)
Admission: RE | Admit: 2022-01-15 | Discharge: 2022-01-15 | Disposition: A | Discharge status: Home / Self Care | Payer: Medicare HMO | Source: Ambulatory Visit | Attending: Gynecologic Oncology | Admitting: Gynecologic Oncology

## 2022-01-15 ENCOUNTER — Inpatient Hospital Stay: Payer: Medicare HMO | Admitting: Hematology and Oncology

## 2022-01-15 ENCOUNTER — Ambulatory Visit (HOSPITAL_COMMUNITY): Payer: Medicare HMO | Admitting: Physician Assistant

## 2022-01-15 VITALS — BP 136/51 | HR 94 | Temp 97.9°F | Resp 18 | Ht 63.0 in | Wt 178.4 lb

## 2022-01-15 DIAGNOSIS — Z6832 Body mass index (BMI) 32.0-32.9, adult: Secondary | ICD-10-CM | POA: Insufficient documentation

## 2022-01-15 DIAGNOSIS — Z87891 Personal history of nicotine dependence: Secondary | ICD-10-CM | POA: Insufficient documentation

## 2022-01-15 DIAGNOSIS — D630 Anemia in neoplastic disease: Secondary | ICD-10-CM | POA: Insufficient documentation

## 2022-01-15 DIAGNOSIS — C541 Malignant neoplasm of endometrium: Secondary | ICD-10-CM | POA: Diagnosis not present

## 2022-01-15 DIAGNOSIS — J449 Chronic obstructive pulmonary disease, unspecified: Secondary | ICD-10-CM | POA: Diagnosis not present

## 2022-01-15 DIAGNOSIS — R180 Malignant ascites: Secondary | ICD-10-CM | POA: Insufficient documentation

## 2022-01-15 DIAGNOSIS — E119 Type 2 diabetes mellitus without complications: Secondary | ICD-10-CM | POA: Diagnosis not present

## 2022-01-15 DIAGNOSIS — C801 Malignant (primary) neoplasm, unspecified: Secondary | ICD-10-CM | POA: Diagnosis not present

## 2022-01-15 DIAGNOSIS — E669 Obesity, unspecified: Secondary | ICD-10-CM | POA: Diagnosis not present

## 2022-01-15 DIAGNOSIS — K219 Gastro-esophageal reflux disease without esophagitis: Secondary | ICD-10-CM | POA: Diagnosis not present

## 2022-01-15 DIAGNOSIS — R9389 Abnormal findings on diagnostic imaging of other specified body structures: Secondary | ICD-10-CM

## 2022-01-15 DIAGNOSIS — Z794 Long term (current) use of insulin: Secondary | ICD-10-CM | POA: Diagnosis not present

## 2022-01-15 DIAGNOSIS — E1169 Type 2 diabetes mellitus with other specified complication: Secondary | ICD-10-CM | POA: Diagnosis not present

## 2022-01-15 DIAGNOSIS — E785 Hyperlipidemia, unspecified: Secondary | ICD-10-CM | POA: Diagnosis not present

## 2022-01-15 DIAGNOSIS — R1110 Vomiting, unspecified: Secondary | ICD-10-CM | POA: Diagnosis not present

## 2022-01-15 DIAGNOSIS — I10 Essential (primary) hypertension: Secondary | ICD-10-CM | POA: Diagnosis not present

## 2022-01-15 DIAGNOSIS — D509 Iron deficiency anemia, unspecified: Secondary | ICD-10-CM | POA: Diagnosis not present

## 2022-01-15 DIAGNOSIS — R110 Nausea: Secondary | ICD-10-CM | POA: Diagnosis not present

## 2022-01-15 DIAGNOSIS — C55 Malignant neoplasm of uterus, part unspecified: Secondary | ICD-10-CM

## 2022-01-15 HISTORY — DX: Personal history of other diseases of the nervous system and sense organs: Z86.69

## 2022-01-15 HISTORY — DX: Other ascites: R18.8

## 2022-01-15 HISTORY — PX: DILATATION & CURETTAGE/HYSTEROSCOPY WITH MYOSURE: SHX6511

## 2022-01-15 HISTORY — DX: Personal history of other diseases of the digestive system: Z87.19

## 2022-01-15 HISTORY — DX: Acute kidney failure, unspecified: N17.9

## 2022-01-15 HISTORY — DX: Personal history of other medical treatment: Z92.89

## 2022-01-15 HISTORY — DX: Dyspnea, unspecified: R06.00

## 2022-01-15 HISTORY — PX: OPERATIVE ULTRASOUND: SHX5996

## 2022-01-15 LAB — GLUCOSE, CAPILLARY
Glucose-Capillary: 100 mg/dL — ABNORMAL HIGH (ref 70–99)
Glucose-Capillary: 132 mg/dL — ABNORMAL HIGH (ref 70–99)

## 2022-01-15 SURGERY — EXAM UNDER ANESTHESIA
Anesthesia: General | Site: Vagina

## 2022-01-15 MED ORDER — PROPOFOL 10 MG/ML IV BOLUS
INTRAVENOUS | Status: DC | PRN
  Administered 2022-01-15: 40 mg via INTRAVENOUS
  Administered 2022-01-15: 100 mg via INTRAVENOUS

## 2022-01-15 MED ORDER — PROCHLORPERAZINE MALEATE 10 MG PO TABS
10.0000 mg | ORAL_TABLET | Freq: Four times a day (QID) | ORAL | 1 refills | Status: DC | PRN
  Filled 2022-01-15: qty 30, 8d supply, fill #0
  Filled 2022-02-15: qty 30, 8d supply, fill #1

## 2022-01-15 MED ORDER — MIDAZOLAM HCL 5 MG/5ML IJ SOLN
INTRAMUSCULAR | Status: DC | PRN
  Administered 2022-01-15: 2 mg via INTRAVENOUS

## 2022-01-15 MED ORDER — FENTANYL CITRATE (PF) 100 MCG/2ML IJ SOLN
INTRAMUSCULAR | Status: AC
  Filled 2022-01-15: qty 2

## 2022-01-15 MED ORDER — PROPOFOL 10 MG/ML IV BOLUS
INTRAVENOUS | Status: AC
  Filled 2022-01-15: qty 20

## 2022-01-15 MED ORDER — CHLORHEXIDINE GLUCONATE 0.12 % MT SOLN
15.0000 mL | Freq: Once | OROMUCOSAL | Status: AC
  Administered 2022-01-15: 15 mL via OROMUCOSAL

## 2022-01-15 MED ORDER — DEXAMETHASONE 4 MG PO TABS
ORAL_TABLET | ORAL | 6 refills | Status: DC
  Filled 2022-01-15: qty 12, 84d supply, fill #0
  Filled 2022-03-07: qty 12, 84d supply, fill #1

## 2022-01-15 MED ORDER — EPHEDRINE SULFATE-NACL 50-0.9 MG/10ML-% IV SOSY
PREFILLED_SYRINGE | INTRAVENOUS | Status: DC | PRN
  Administered 2022-01-15: 5 mg via INTRAVENOUS

## 2022-01-15 MED ORDER — ORAL CARE MOUTH RINSE: 15.0000 mL | Freq: Once | OROMUCOSAL | Status: AC

## 2022-01-15 MED ORDER — ALBUTEROL SULFATE (2.5 MG/3ML) 0.083% IN NEBU
INHALATION_SOLUTION | RESPIRATORY_TRACT | Status: AC
  Filled 2022-01-15: qty 3

## 2022-01-15 MED ORDER — LIDOCAINE HCL (PF) 1 % IJ SOLN
INTRAMUSCULAR | Status: AC
  Filled 2022-01-15: qty 30

## 2022-01-15 MED ORDER — MIDAZOLAM HCL 2 MG/2ML IJ SOLN
INTRAMUSCULAR | Status: AC
  Filled 2022-01-15: qty 2

## 2022-01-15 MED ORDER — FENTANYL CITRATE PF 50 MCG/ML IJ SOSY: 25.0000 ug | PREFILLED_SYRINGE | INTRAMUSCULAR | Status: DC | PRN

## 2022-01-15 MED ORDER — SODIUM CHLORIDE 0.9 % IR SOLN
Status: DC | PRN
  Administered 2022-01-15: 3000 mL

## 2022-01-15 MED ORDER — MEPERIDINE HCL 50 MG/ML IJ SOLN: 6.2500 mg | INTRAMUSCULAR | Status: DC | PRN

## 2022-01-15 MED ORDER — SODIUM CHLORIDE 0.9 % IR SOLN
Status: DC | PRN
  Administered 2022-01-15: 1000 mL

## 2022-01-15 MED ORDER — ALBUTEROL SULFATE (2.5 MG/3ML) 0.083% IN NEBU
2.5000 mg | INHALATION_SOLUTION | Freq: Once | RESPIRATORY_TRACT | Status: AC
  Administered 2022-01-15: 2.5 mg via RESPIRATORY_TRACT

## 2022-01-15 MED ORDER — LIDOCAINE 2% (20 MG/ML) 5 ML SYRINGE
INTRAMUSCULAR | Status: DC | PRN
  Administered 2022-01-15: 40 mg via INTRAVENOUS

## 2022-01-15 MED ORDER — DEXAMETHASONE SODIUM PHOSPHATE 10 MG/ML IJ SOLN
INTRAMUSCULAR | Status: DC | PRN
  Administered 2022-01-15: 4 mg via INTRAVENOUS

## 2022-01-15 MED ORDER — ACETAMINOPHEN 500 MG PO TABS
1000.0000 mg | ORAL_TABLET | ORAL | Status: AC
  Administered 2022-01-15: 1000 mg via ORAL
  Filled 2022-01-15: qty 2

## 2022-01-15 MED ORDER — FENTANYL CITRATE (PF) 100 MCG/2ML IJ SOLN
INTRAMUSCULAR | Status: DC | PRN
  Administered 2022-01-15: 50 ug via INTRAVENOUS

## 2022-01-15 MED ORDER — ONDANSETRON HCL 4 MG/2ML IJ SOLN
INTRAMUSCULAR | Status: DC | PRN
Start: 2022-01-15 — End: 2022-01-15
  Administered 2022-01-15: 4 mg via INTRAVENOUS

## 2022-01-15 MED ORDER — MIDAZOLAM HCL 2 MG/2ML IJ SOLN: 0.5000 mg | Freq: Once | INTRAMUSCULAR | Status: DC | PRN

## 2022-01-15 MED ORDER — ONDANSETRON HCL 4 MG/2ML IJ SOLN
INTRAMUSCULAR | Status: AC
  Filled 2022-01-15: qty 2

## 2022-01-15 MED ORDER — PHENYLEPHRINE HCL-NACL 20-0.9 MG/250ML-% IV SOLN
INTRAVENOUS | Status: DC | PRN
  Administered 2022-01-15: 25 ug/min via INTRAVENOUS

## 2022-01-15 MED ORDER — SUCCINYLCHOLINE CHLORIDE 200 MG/10ML IV SOSY
PREFILLED_SYRINGE | INTRAVENOUS | Status: AC
  Filled 2022-01-15: qty 10

## 2022-01-15 MED ORDER — DEXAMETHASONE SODIUM PHOSPHATE 10 MG/ML IJ SOLN
INTRAMUSCULAR | Status: AC
  Filled 2022-01-15: qty 1

## 2022-01-15 MED ORDER — PHENYLEPHRINE 40 MCG/ML (10ML) SYRINGE FOR IV PUSH (FOR BLOOD PRESSURE SUPPORT)
PREFILLED_SYRINGE | INTRAVENOUS | Status: DC | PRN
  Administered 2022-01-15: 80 ug via INTRAVENOUS

## 2022-01-15 MED ORDER — DEXAMETHASONE SODIUM PHOSPHATE 4 MG/ML IJ SOLN: 4.0000 mg | INTRAMUSCULAR | Status: DC

## 2022-01-15 MED ORDER — SUCCINYLCHOLINE CHLORIDE 200 MG/10ML IV SOSY
PREFILLED_SYRINGE | INTRAVENOUS | Status: DC | PRN
  Administered 2022-01-15: 100 mg via INTRAVENOUS

## 2022-01-15 MED ORDER — ACETAMINOPHEN 500 MG PO TABS: 1000.0000 mg | ORAL_TABLET | Freq: Once | ORAL | Status: DC

## 2022-01-15 MED ORDER — ONDANSETRON HCL 8 MG PO TABS
8.0000 mg | ORAL_TABLET | Freq: Two times a day (BID) | ORAL | 1 refills | Status: DC | PRN
  Filled 2022-01-15: qty 30, 15d supply, fill #0
  Filled 2022-02-15: qty 30, 15d supply, fill #1

## 2022-01-15 MED ORDER — LACTATED RINGERS IV SOLN: INTRAVENOUS | Status: DC

## 2022-01-15 MED ORDER — LIDOCAINE-PRILOCAINE 2.5-2.5 % EX CREA
TOPICAL_CREAM | CUTANEOUS | 3 refills | Status: DC
  Filled 2022-01-15: qty 30, 30d supply, fill #0

## 2022-01-15 MED ORDER — POLYETHYLENE GLYCOL 3350 17 GM/SCOOP PO POWD
17.0000 g | Freq: Every day | ORAL | 0 refills | Status: DC
  Filled 2022-01-15: qty 238, 14d supply, fill #0

## 2022-01-15 MED ORDER — PHENYLEPHRINE 40 MCG/ML (10ML) SYRINGE FOR IV PUSH (FOR BLOOD PRESSURE SUPPORT)
PREFILLED_SYRINGE | INTRAVENOUS | Status: AC
  Filled 2022-01-15: qty 10

## 2022-01-15 SURGICAL SUPPLY — 25 items
BAG COUNTER SPONGE SURGICOUNT (BAG) ×4 IMPLANT
BAG SPNG CNTER NS LX DISP (BAG) ×3
BIPOLAR CUTTING LOOP 21FR (ELECTRODE)
DEVICE MYOSURE LITE (MISCELLANEOUS) IMPLANT
DEVICE MYOSURE REACH (MISCELLANEOUS) ×1 IMPLANT
DILATOR CANAL MILEX (MISCELLANEOUS) IMPLANT
DRAPE HYSTEROSCOPY (MISCELLANEOUS) ×4 IMPLANT
DRSG TELFA 3X8 NADH (GAUZE/BANDAGES/DRESSINGS) ×4 IMPLANT
GAUZE 4X4 16PLY ~~LOC~~+RFID DBL (SPONGE) ×4 IMPLANT
GLOVE SURG ENC MOIS LTX SZ6 (GLOVE) ×4 IMPLANT
GLOVE SURG ENC MOIS LTX SZ6.5 (GLOVE) IMPLANT
GOWN STRL REUS W/TWL LRG LVL3 (GOWN DISPOSABLE) ×4 IMPLANT
IV NS IRRIG 3000ML ARTHROMATIC (IV SOLUTION) ×4 IMPLANT
KIT PROCEDURE FLUENT (KITS) ×1 IMPLANT
LOOP CUTTING BIPOLAR 21FR (ELECTRODE) IMPLANT
MYOSURE XL FIBROID (MISCELLANEOUS)
PACK VAGINAL MINOR WOMEN LF (CUSTOM PROCEDURE TRAY) ×4 IMPLANT
PAD DRESSING TELFA 3X8 NADH (GAUZE/BANDAGES/DRESSINGS) ×3 IMPLANT
PAD OB MATERNITY 4.3X12.25 (PERSONAL CARE ITEMS) ×4 IMPLANT
PAD PREP 24X48 CUFFED NSTRL (MISCELLANEOUS) ×4 IMPLANT
PIPET BIOPSY ENDOMETRIAL 3MM (SUCTIONS) ×1 IMPLANT
SEAL ROD LENS SCOPE MYOSURE (ABLATOR) IMPLANT
SYSTEM TISS REMOVAL MYOSURE XL (MISCELLANEOUS) IMPLANT
TOWEL OR 17X26 10 PK STRL BLUE (TOWEL DISPOSABLE) ×4 IMPLANT
WATER STERILE IRR 500ML POUR (IV SOLUTION) ×4 IMPLANT

## 2022-01-15 NOTE — Assessment & Plan Note (Signed)
She is at risk of uncontrolled diabetes while on treatment ?We discussed importance of dietary modification while on chemotherapy ?

## 2022-01-15 NOTE — Progress Notes (Signed)
[facility] CONSULT NOTE  Patient Care Team: [name], MD as PCP - General (Internal Medicine) Jodelle Gross, RN as [facility] Care Management  ASSESSMENT & PLAN:  Uterine cancer [facility]) I have reviewed her imaging studies Clinically, she appears to have locally advanced uterine cancer, high-grade serous She will be undergoing additional examination, evaluation and biopsy if possible We discussed the role of neoadjuvant chemotherapy approach We reviewed the NCCN guidelines We discussed the role of chemotherapy. The intent is of curative intent.  We discussed some of the risks, benefits, side-effects of carboplatin & Taxol. Treatment is intravenous, every 3 weeks x 6 cycles  Some of the short term side-effects included, though not limited to, including weight loss, life threatening infections, risk of allergic reactions, need for transfusions of blood products, nausea, vomiting, change in bowel habits, loss of hair, admission to hospital for various reasons, and risks of death.   Long term side-effects are also discussed including risks of infertility, permanent damage to nerve function, hearing loss, chronic fatigue, kidney damage with possibility needing hemodialysis, and rare secondary malignancy including bone marrow disorders.  The patient is aware that the response rates discussed earlier is not guaranteed.  After a long discussion, patient made an informed decision to proceed with the prescribed plan of care.   Patient education material was dispensed. We discussed premedication with dexamethasone before chemotherapy. Due to poor venous access, I recommend port placement We will schedule chemo education class I will see her early next week with repeat blood work and IV iron She will start chemotherapy end of next week Recommend minimum 3 cycles of treatment before repeating imaging study  Dyslipidemia associated with type 2 diabetes  mellitus (HCC) She is at risk of uncontrolled diabetes while on treatment We discussed importance of dietary modification while on chemotherapy  Iron deficiency anemia The most likely cause of her anemia is due to chronic blood loss/malabsorption syndrome. We discussed some of the risks, benefits, and alternatives of intravenous iron infusions. The patient is symptomatic from anemia and the iron level is critically low. She tolerated oral iron supplement poorly and desires to achieved higher levels of iron faster for adequate hematopoesis. Some of the side-effects to be expected including risks of infusion reactions, phlebitis, headaches, nausea and fatigue.  The patient is willing to proceed. Patient education material was dispensed.  Goal is to keep ferritin level greater than 50 and resolution of anemia Due to poor tolerance to oral iron, I have discontinue oral iron supplement   Malignant ascites She has ascites on exam but not symptomatic I will reexamine her next week There is no role for paracentesis right now  Chronic nausea The cause of nausea is multifactorial, could be related to carcinomatosis or side effects of oral iron supplement I recommend discontinuation of oral iron supplement I gave her a prescription of Zofran and Compazine to take as needed We discussed the importance of regular laxatives to avoid constipation as a contributing factor to chronic nausea Due to risk of dehydration, I recommend discontinuation of chlorthalidone  Orders Placed This Encounter  Procedures   IR IMAGING GUIDED PORT INSERTION    Standing Status:   Future    Standing Expiration Date:   01/16/2023    Order Specific Question:   Reason for Exam (SYMPTOM  OR DIAGNOSIS REQUIRED)    Answer:   need port for chemo    Order Specific Question:   Preferred Imaging Location?    Answer:  [facility]   CBC with Differential (Cancer Center Only)    Standing Status:   Standing    Number of  Occurrences:   20    Standing Expiration Date:   01/16/2023   CMP (Cancer Center only)    Standing Status:   Standing    Number of Occurrences:   20    Standing Expiration Date:   01/16/2023    The total time spent in the appointment was 60 minutes encounter with patients including review of chart and various tests results, discussions about plan of care and coordination of care plan   All questions were answered. The patient knows to call the clinic with any problems, questions or concerns. No barriers to learning was detected.  Artis Delay, MD 3/21/202310:31 AM  CHIEF COMPLAINTS/PURPOSE OF CONSULTATION:  Uterine cancer, for further management  HISTORY OF PRESENTING ILLNESS:  Sherri Gill 71 y.o. 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 Summary of oncologic history is as follows: Oncology History  Uterine cancer (HCC)  10/12/2021 Imaging   US pelvis  1. Heterogeneous endometrial thickening with appearance of moderate complex fluid in the endometrial canal, possible hemorrhagic material. Endometrial thickness is considered abnormal for an asymptomatic post-menopausal  female. Endometrial sampling should be considered to exclude carcinoma. 2. Nonvisualized ovary 3. Heterogeneous thick-walled urinary bladder, correlate for cystitis.   12/28/2021 Pathology Results   CYTOLOGY - NON PAP  CASE: [id number]  PATIENT: Sherri Gill  Non-Gynecological Cytology Report   Clinical History: Abnormal pelvic US, highly suspicious of malignant ascites  Specimen Submitted:  A. ASCITES, PARACENTESIS:    FINAL MICROSCOPIC DIAGNOSIS:  - Malignant cells consistent with adenocarcinoma  - See comment   SPECIMEN ADEQUACY:  Satisfactory for evaluation   DIAGNOSTIC COMMENTS:  Immunohistochemical stains show that the tumor cells are positive for CK7 and PAX8 while they are negative for CK20, CDX2 and ER, consistent with above interpretation.  Additionally, the tumor cells are diffusely positive for p53 (clonal overexpression), suggestive of a high-grade serous carcinoma.    12/28/2021 Imaging   US pelvis 1. Persistent abnormal appearance of the endometrium, heterogeneously thickened and complex with possible intraluminal fluid. This is similar to that seen on December 2022 ultrasound. Endometrial thickness is considered abnormal for an asymptomatic post-menopausal female. Endometrial sampling should be considered to exclude carcinoma. 2. Nonvisualization of the ovaries.   12/28/2021 Imaging   1. Large volume ascites. 2. Small umbilical hernia containing fat and ascites. 3. Haziness of anterior omentum most likely related to ascites, other etiologies such as metastatic disease can not be excluded. 4. Diffuse colonic diverticulosis without evidence for diverticulitis.   12/29/2021 Procedure   Successful ultrasound-guided paracentesis yielding 2.8 liters of peritoneal fluid.  01/14/2022 Initial Diagnosis   Uterine cancer (HCC)   01/14/2022 Cancer Staging   Staging form: Corpus Uteri - Carcinoma and Carcinosarcoma, AJCC 8th Edition - Clinical: Stage III (cT3, cN0, cM0) -  Signed by Artis Delay, MD on 01/14/2022 Stage prefix: Initial diagnosis    01/21/2022 -  Chemotherapy   Patient is on Treatment Plan : UTERINE Carboplatin AUC 6 / Paclitaxel q21d       MEDICAL HISTORY:  Past Medical History:  Diagnosis Date   AKI (acute kidney injury) (HCC)    History of   Ascites    09/2021 paracentesis with almost 3 L of fluid 12/2021 Paracentesis performed with 2.8 L   Chronic anemia 08/29/2012   Need colonoscopy or report. Need iron panel.    Dyslipidemia 06/30/2007   Dyspnea    Essential hypertension, benign 06/30/2007   GERD 06/30/2007   History of blood transfusion    History of fatty infiltration of liver    History of migraine    Hx of Herpes simplex meningitis 2015   Also noted to have primary empty sella on imaging at this admission   Insomnia 06/11/2012   Major depressive disorder, recurrent episode, moderate with anxious distress (HCC) 06/30/2007   Nausea and vomiting 01/13/2022   Obesity, BMI 35-40 06/11/2012   Peripheral neuropathy 2/2 T2DM 12/28/2009   Primary empty sella syndrome (HCC) 2015   Noted on imaging during hospitalization for herpes meningitis; no pituitary mass, no hormone w/u at that time, hormonally asymptomatic   Type 2 diabetes mellitus with neurological complications (HCC) 06/30/2007    SURGICAL HISTORY: Past Surgical History:  Procedure Laterality Date   COLONOSCOPY W/ POLYPECTOMY  06/28/2004   DENTAL SURGERY     TUBAL LIGATION      SOCIAL HISTORY: Social History   Socioeconomic History   Marital status: Single    Spouse name: Not on file   Number of children: Not on file   Years of education: 12   Highest education level: Not on file  Occupational History   Occupation: Airline pilot    Employer: [employer]  Tobacco Use   Smoking status: Former    Types: Cigarettes    Quit date: 10/28/1978    Years since quitting: 43.2   Smokeless tobacco: Never  Vaping Use   Vaping Use: Never used  Substance and Sexual Activity    Alcohol use: No    Alcohol/week: 0.0 standard drinks   Drug use: No   Sexual activity: Not Currently    Partners: Male    Birth control/protection: Post-menopausal  Other Topics Concern   Not on file  Social History Narrative   Not on file   Social Determinants of Health   Financial Resource Strain: Not on file  Food Insecurity: Not on file  Transportation Needs: Not on file  Physical Activity: Not on file  Stress: Not on file  Social Connections: Not on file  Intimate Partner Violence: Not on file    FAMILY HISTORY: Family History  Adopted: Yes  Problem Relation Age of Onset   Dementia Mother    Thyroid cancer Maternal Aunt    Prostate cancer Cousin     ALLERGIES:  is allergic to codeine sulfate, penicillins, percocet [oxycodone-acetaminophen], codeine, and lisinopril.  MEDICATIONS:  Current Outpatient Medications  Medication Sig Dispense Refill   dexamethasone (DECADRON) 4 MG tablet Take 2 tablets by mouth the night before and 2 tablets the morning of chemotherapy, every 3 weeks for 6 cycles 36 tablet 6   polyethylene  glycol powder (GLYCOLAX/MIRALAX) 17 GM/SCOOP powder Take 17 g by mouth daily. 238 g 0   acetaminophen (TYLENOL) 500 MG tablet Take 1,000 mg by mouth every 6 (six) hours as needed for mild pain.     albuterol (VENTOLIN HFA) 108 (90 Base) MCG/ACT inhaler Inhale 2 puffs into the lungs every 4 (four) hours as needed for wheezing or shortness of breath. 8 g 0   amLODipine (NORVASC) 10 MG tablet Take 1 tablet (10 mg total) by mouth daily. (Patient taking differently: Take 10 mg by mouth at bedtime.) 90 tablet 2   atorvastatin (LIPITOR) 20 MG tablet Take 1 tablet (20 mg total) by mouth daily. (Patient taking differently: Take 20 mg by mouth at bedtime.) 90 tablet 3   Blood Glucose Monitoring Suppl (ONE TOUCH ULTRA MINI) w/Device KIT Please use as directed. 1 each 0   Cholecalciferol 1000 units capsule Take 1 capsule (1,000 Units total) by mouth daily. 90 capsule 1    dorzolamide-timolol (COSOPT) 22.3-6.8 MG/ML ophthalmic solution Place 1 drop into both eyes 2 (two) times daily.     glucose blood (ONE TOUCH ULTRA TEST) test strip Use as instructed 100 each 1   Insulin Pen Needle 32G X 4 MM MISC 1 each by Does not apply route daily. 100 each 3   latanoprost (XALATAN) 0.005 % ophthalmic solution Place 1 drop into both eyes at bedtime.     lidocaine-prilocaine (EMLA) cream Apply to affected area once 30 g 3   liraglutide (VICTOZA) 18 MG/3ML SOPN PLEASE START TAKING VICTOZA 0.6MG  DAILY WEEK 1, THEN TAKE 1.2MG  DAILY WEEK 2, AND THEN TAKE 1.8MG  DAILY THEREAFTER (Patient taking differently: Inject 1.2 mg into the skin daily.) 9 mL 3   losartan (COZAAR) 100 MG tablet Take 100 mg by mouth daily.     ondansetron (ZOFRAN) 8 MG tablet Take 1 tablet (8 mg total) by mouth 2 (two) times daily as needed. Start on the third day after chemotherapy. 30 tablet 1   ONETOUCH DELICA LANCETS 33G MISC Please use as directed. 100 each 0   prochlorperazine (COMPAZINE) 10 MG tablet Take 1 tablet (10 mg total) by mouth every 6 (six) hours as needed (Nausea or vomiting). 30 tablet 1   traMADol (ULTRAM) 50 MG tablet Take 1 tablet (50 mg total) by mouth every 6 (six) hours as needed for moderate pain. 14 tablet 0   No current facility-administered medications for this visit.    REVIEW OF SYSTEMS:   Constitutional: Denies fevers, chills or abnormal night sweats Eyes: Denies blurriness of vision, double vision or watery eyes Ears, nose, mouth, throat, and face: Denies mucositis or sore throat Respiratory: Denies cough, dyspnea or wheezes Skin: Denies abnormal skin rashes Lymphatics: Denies new lymphadenopathy or easy bruising Neurological:Denies numbness, tingling or new weaknesses Behavioral/Psych: Mood is stable, no new changes  All other systems were reviewed with the patient and are negative.  PHYSICAL EXAMINATION: ECOG PERFORMANCE STATUS: 1 - Symptomatic but completely  ambulatory  Vitals:   01/15/22 0857  BP: (!) 136/51  Pulse: 94  Resp: 18  Temp: 97.9 F (36.6 C)  SpO2: 100%   Filed Weights   01/15/22 0857  Weight: 178 lb 6.4 oz (80.9 kg)    GENERAL:alert, no distress and comfortable SKIN: skin color, texture, turgor are normal, no rashes or significant lesions EYES: normal, conjunctiva are pink and non-injected, sclera clear OROPHARYNX:no exudate, no erythema and lips, buccal mucosa, and tongue normal  NECK: supple, thyroid normal size, non-tender, without nodularity LYMPH:  no palpable lymphadenopathy in the cervical, axillary or inguinal LUNGS: clear to auscultation and percussion with normal breathing effort HEART: regular rate & rhythm and no murmurs and no lower extremity edema ABDOMEN:abdomen soft, distended with ascites.  Nontender to palpation Musculoskeletal:no cyanosis of digits and no clubbing  PSYCH: alert & oriented x 3 with fluent speech NEURO: no focal motor/sensory deficits  LABORATORY DATA:  I have reviewed the data as listed Lab Results  Component Value Date   WBC 18.4 (H) 12/31/2021   HGB 10.0 (L) 12/31/2021   HCT 32.3 (L) 12/31/2021   MCV 76.2 (L) 12/31/2021   PLT 741 (H) 12/31/2021   Recent Labs    12/28/21 1222 12/29/21 0540 12/30/21 0303 12/31/21 0311 01/10/22 1644  NA 133* 133* 135 133* 139  K 3.4* 3.5 3.6 3.8 4.4  CL 93* 95* 99 94* 95*  CO2 26 25 28 28 26   GLUCOSE 122* 136* 146* 203* 126*  BUN 48* 50* 42* 44* 13  CREATININE 1.86* 1.75* 1.21* 1.40* 0.99  CALCIUM 9.2 9.3 8.7* 9.0 9.7  GFRNONAA 29* 31* 49* 41*  --   PROT 7.8 7.7  --   --   --   ALBUMIN 3.3* 3.1*  --   --   --   AST 10* 12*  --   --   --   ALT 10 11  --   --   --   ALKPHOS 100 114  --   --   --   BILITOT 0.2* 0.5  --   --   --     RADIOGRAPHIC STUDIES: I have personally reviewed the radiological images as listed and agreed with the findings in the report. CT ABDOMEN PELVIS WO CONTRAST  Result Date: 12/28/2021 CLINICAL DATA:   Acute abdominal pain.  Known hernia. EXAM: CT ABDOMEN AND PELVIS WITHOUT CONTRAST TECHNIQUE: Multidetector CT imaging of the abdomen and pelvis was performed following the standard protocol without IV contrast. RADIATION DOSE REDUCTION: This exam was performed according to the departmental dose-optimization program which includes automated exposure control, adjustment of the mA and/or kV according to patient size and/or use of iterative reconstruction technique. COMPARISON:  Ultrasound abdomen 10/18/2021. FINDINGS: Lower chest: There is minimal atelectasis in the left lung base. Hepatobiliary: No focal liver abnormality is seen. No gallstones, gallbladder wall thickening, or biliary dilatation. Pancreas: Unremarkable. No pancreatic ductal dilatation or surrounding inflammatory changes. Spleen: Normal in size without focal abnormality. Adrenals/Urinary Tract: Adrenal glands are unremarkable. Kidneys are normal, without renal calculi, focal lesion, or hydronephrosis. Bladder is unremarkable. Stomach/Bowel: Stomach is within normal limits. No evidence of bowel wall thickening, distention, or inflammatory changes. There is diffuse colonic diverticulosis without evidence for diverticulitis. The appendix is not visualized. Vascular/Lymphatic: No significant vascular findings are present. No enlarged abdominal or pelvic lymph nodes. Reproductive: Uterus and bilateral adnexa are unremarkable. There are surgical clips in the bilateral adnexa. Other: There is large volume ascites in all 4 abdominal quadrants. There is no free intraperitoneal air. There is a small umbilical hernia containing ascites. There is also some haziness of omental fat in the anterior abdomen. Musculoskeletal: No fracture is seen. No focal osseous lesion identified. IMPRESSION: 1. Large volume ascites. 2. Small umbilical hernia containing fat and ascites. 3. Haziness of anterior omentum most likely related to ascites, other etiologies such as metastatic  disease can not be excluded. 4. Diffuse colonic diverticulosis without evidence for diverticulitis. Electronically Signed   By: Darliss Cheney M.D.   On: 12/28/2021 15:12  DG Chest 2 View  Result Date: 12/28/2021 CLINICAL DATA:  Chest pain EXAM: CHEST - 2 VIEW COMPARISON:  11/02/2021 FINDINGS: Cardiac shadow is within normal limits. Mild vascular prominence is noted with stable vessel on end in the left mid lung. Lungs are well aerated bilaterally. No focal infiltrate or sizable effusion is seen. Degenerative changes of the thoracic spine are noted. IMPRESSION: No acute abnormality noted. Electronically Signed   By: Alcide Clever M.D.   On: 12/28/2021 20:21   US Paracentesis  Result Date: 12/29/2021 INDICATION: Patient with abdominal pain, distension, endometrial thickening on imaging, new ascites. Request is made for diagnostic and therapeutic paracentesis of 5 L maximum. EXAM: ULTRASOUND GUIDED DIAGNOSTIC AND THERAPEUTIC PARACENTESIS MEDICATIONS: 10 mL 1% lidocaine COMPLICATIONS: None immediate. PROCEDURE: Informed written consent was obtained from the patient after a discussion of the risks, benefits and alternatives to treatment. A timeout was performed prior to the initiation of the procedure. Initial ultrasound scanning demonstrates a moderate amount of ascites within the right lower abdominal quadrant. The right lower abdomen was prepped and draped in the usual sterile fashion. 1% lidocaine was used for local anesthesia. Following this, a 19 gauge, 10-cm, Yueh catheter was introduced. An ultrasound image was saved for documentation purposes. The paracentesis was performed. The catheter was removed and a dressing was applied. The patient tolerated the procedure well without immediate post procedural complication. FINDINGS: A total of approximately 2.8 liters of yellow, cloudy fluid was removed. Samples were sent to the laboratory as requested by the clinical team. IMPRESSION: Successful ultrasound-guided  paracentesis yielding 2.8 liters of peritoneal fluid. Read by: Loyce Dys PA-C Electronically Signed   By: Olive Bass M.D.   On: 12/29/2021 13:56   DG CHEST PORT 1 VIEW  Result Date: 12/30/2021 CLINICAL DATA:  Dyspnea. EXAM: PORTABLE CHEST 1 VIEW COMPARISON:  12/28/2021 FINDINGS: Stable cardiomediastinal contours. Lung volumes are low. No pleural effusion or edema. No airspace opacities identified. Visualized osseous structures are unremarkable. IMPRESSION: No acute cardiopulmonary abnormalities. Electronically Signed   By: Signa Kell M.D.   On: 12/30/2021 10:12   US PELVIC COMPLETE WITH TRANSVAGINAL  Result Date: 12/28/2021 CLINICAL DATA:  71 year old with endometrial thickening on ultrasound. Abdominal ascites. EXAM: TRANSABDOMINAL AND TRANSVAGINAL ULTRASOUND OF PELVIS TECHNIQUE: Both transabdominal and transvaginal ultrasound examinations of the pelvis were performed. Transabdominal technique was performed for global imaging of the pelvis including uterus, ovaries, adnexal regions, and pelvic cul-de-sac. It was necessary to proceed with endovaginal exam following the transabdominal exam to visualize the uterus and endometrium. COMPARISON:  Pelvic ultrasound 10/12/2021. Abdominopelvic CT earlier today. FINDINGS: Uterus Measurements: 7.9 x 5 x 5.2 cm = volume: 107 mL. The uterus is anteverted. No fibroids or other mass visualized. Endometrium Thickness: 22-36 mm, heterogeneous and complex. Probable endometrial fluid. There is some endometrial vascularity. Right ovary Not visualized. Left ovary Not visualized. Other findings Pelvic ascites, also seen on recent CT. IMPRESSION: 1. Persistent abnormal appearance of the endometrium, heterogeneously thickened and complex with possible intraluminal fluid. This is similar to that seen on December 2022 ultrasound. Endometrial thickness is considered abnormal for an asymptomatic post-menopausal female. Endometrial sampling should be considered to exclude  carcinoma. 2. Nonvisualization of the ovaries. Electronically Signed   By: Narda Rutherford M.D.   On: 12/28/2021 21:02

## 2022-01-15 NOTE — Research (Signed)
[employer] 2021-05 - Specimen Collection Study to Evaluate Biomarkers in Subjects with Cancer  ? ?Rec'd referral from Dr Alvy Bimler. Patient is having biopsy today, so earliest blood draw date would be 01/22/22. I will reach out to pt tomorrow to introduce study. ? ?Marjie Skiff Tab Rylee, RN, BSN, CHPN ?She  [facility]  [facility] ?Clinical Research Nurse ?[place] ?Direct Dial [phone]  Pager [phone] ?01/15/2022 12:36 PM ?

## 2022-01-15 NOTE — Transfer of Care (Signed)
Immediate Anesthesia Transfer of Care Note ? ?Patient: Sherri Gill ? ?Procedure(s) Performed: EXAM UNDER ANESTHESIA (Vagina ) ?DILATATION & CURETTAGE/HYSTEROSCOPY WITH MYOSURE (Uterus) ?OPERATIVE ULTRASOUND ? ?Patient Location: PACU ? ?Anesthesia Type:General ? ?Level of Consciousness: awake and alert ? ?Airway & Oxygen Therapy: Patient Spontanous Breathing and Patient connected to face mask oxygen ? ?Post-op Assessment: Report given to RN and Post -op Vital signs reviewed and stable ? ?Post vital signs: Reviewed and stable ? ?Last Vitals:  ?Vitals Value Taken Time  ?BP 149/75 01/15/22 1503  ?Temp    ?Pulse 108 01/15/22 1504  ?Resp 26 01/15/22 1504  ?SpO2 96 % 01/15/22 1504  ?Vitals shown include unvalidated device data. ? ?Last Pain:  ?Vitals:  ? 01/15/22 1118  ?TempSrc: Oral  ?PainSc: 0-No pain  ?   ? ?Patients Stated Pain Goal: 3 (01/14/22 1403) ? ?Complications: No notable events documented. ?

## 2022-01-15 NOTE — Progress Notes (Signed)
Met with Sherri Gill and her sister.  Provided her with the [place] folder and encouraged her to call with any questions or needs. ?

## 2022-01-15 NOTE — Assessment & Plan Note (Signed)
She has ascites on exam but not symptomatic ?I will reexamine her next week ?There is no role for paracentesis right now ?

## 2022-01-15 NOTE — Assessment & Plan Note (Signed)
I have reviewed her imaging studies ?Clinically, she appears to have locally advanced uterine cancer, high-grade serous ?She will be undergoing additional examination, evaluation and biopsy if possible ?We discussed the role of neoadjuvant chemotherapy approach ?We reviewed the NCCN guidelines ?We discussed the role of chemotherapy. The intent is of curative intent. ? ?We discussed some of the risks, benefits, side-effects of carboplatin & Taxol. Treatment is intravenous, every 3 weeks x 6 cycles ? ?Some of the short term side-effects included, though not limited to, including weight loss, life threatening infections, risk of allergic reactions, need for transfusions of blood products, nausea, vomiting, change in bowel habits, loss of hair, admission to hospital for various reasons, and risks of death.  ? ?Long term side-effects are also discussed including risks of infertility, permanent damage to nerve function, hearing loss, chronic fatigue, kidney damage with possibility needing hemodialysis, and rare secondary malignancy including bone marrow disorders. ? ?The patient is aware that the response rates discussed earlier is not guaranteed.  After a long discussion, patient made an informed decision to proceed with the prescribed plan of care.  ? ?Patient education material was dispensed. ?We discussed premedication with dexamethasone before chemotherapy. ?Due to poor venous access, I recommend port placement ?We will schedule chemo education class ?I will see her early next week with repeat blood work and IV iron ?She will start chemotherapy end of next week ?Recommend minimum 3 cycles of treatment before repeating imaging study ?

## 2022-01-15 NOTE — Anesthesia Postprocedure Evaluation (Signed)
Anesthesia Post Note ? ?Patient: Sherri Gill ? ?Procedure(s) Performed: EXAM UNDER ANESTHESIA (Vagina ) ?DILATATION & CURETTAGE/HYSTEROSCOPY WITH MYOSURE (Uterus) ?OPERATIVE ULTRASOUND ? ?  ? ?Patient location during evaluation: PACU ?Anesthesia Type: General ?Level of consciousness: awake and alert, oriented and patient cooperative ?Pain management: pain level controlled ?Vital Signs Assessment: post-procedure vital signs reviewed and stable ?Respiratory status: spontaneous breathing, nonlabored ventilation, respiratory function stable and patient connected to nasal cannula oxygen ?Cardiovascular status: blood pressure returned to baseline and stable ?Postop Assessment: no apparent nausea or vomiting, able to ambulate and adequate PO intake ?Anesthetic complications: no ?Comments: Pt has NCO2 at home, which she will wear tonight ? ? ?No notable events documented. ? ?Last Vitals:  ?Vitals:  ? 01/15/22 1745 01/15/22 1805  ?BP: (!) 146/59 (!) 166/59  ?Pulse: 98 (!) 105  ?Resp: 19 18  ?Temp:  36.8 ?C  ?SpO2: 91% 93%  ?  ?Last Pain:  ?Vitals:  ? 01/15/22 1805  ?TempSrc:   ?PainSc: 0-No pain  ? ? ?  ?  ?  ?  ?  ?  ? ?Jaquane Boughner,E. Alois Colgan ? ? ? ? ?

## 2022-01-15 NOTE — Op Note (Signed)
OPERATIVE NOTE ? ?PATIENT: Sherri Gill ? ?ENCOUNTER DATE: 01/15/22 ? ?Preop Diagnosis: Adenocarcinoma, thickened endometrium, suspected endometrial cancer ? ?Postoperative Diagnosis: same as above ? ?Surgery: Cervical dilation under ultrasound guidance, hysteroscopy, endometrial sampling using the Myosure ? ?Surgeons:  Valarie Cones MD ? ?Assistant: none ? ?Anesthesia: General  ? ?Estimated blood loss: 25 ml ? ?IVF:  see I&O flowsheet  ? ?Urine output: n/a  ? ?Complications: None apparent ? ?Pathology: endometrial curetteings ? ?Operative findings: On EUA, 8 cm mobile uterus, some nodularity along cul de sac. Cervix normal in appearance without discernible external os. Difficulty noted in dilating the cervix after using scalpel to incise in area of what what thought to be the external os. Under ultrasound guidance, cervix was dilated with confirmation on imaging of intra-uterine placement of dilators. On hysteroscopy, abnormal appearing endocervix and very calcified tissue obscuring good visualization of endometrium concerning for replacement by tumor.  ? ?Procedure: The patient was identified in the preoperative holding area. Informed consent was signed on the chart. Patient was seen history was reviewed and exam was performed.  ? ?The patient was then taken to the operating room and placed in the supine position with SCD hose on. General anesthesia was then induced without difficulty. She was then placed in the dorsolithotomy position. The perineum was prepped with Betadine. The vagina was prepped with Betadine. The patient was then draped after the prep was dried.  ? ?Timeout was performed the patient, procedure, antibiotic, allergy, and length of procedure.  ? ?The speculum was placed in the posterior vagina. The single tooth tenaculum was placed on the anterior lip of the cervix. A scalpel was used to incise the cervix where external os was presumed to be. The cervix was then attempted to be dilated but given  significant resistance, ultrasound guidance was requested. The cervix was dilated under ultrasound guidance. Once dilated, hysteroscopy was performed. Findings within the endocervix were concerning for replacement by tumor. Myosure reach was then used to sample the endometrium. The hysteroscope was removed.  ? ?The tenaculum was removed and hemostasis was observed.  ? ?The vagina was irrigated. ? ?All instrument, suture, laparotomy, Ray-Tec, and needle counts were correct x2. The patient tolerated the procedure well and was taken recovery room in stable condition.  ? ?Lafonda Mosses, MD ? ?

## 2022-01-15 NOTE — Progress Notes (Signed)
Patient here for new patient consultation with Dr. Jeral Pinch and for a pre-operative discussion prior to her scheduled surgery on January 15, 2022. She is scheduled for examination under anesthesia, dilation and curettage of the uterus, possible hysteroscopy, possible ultrasound guidance. The surgery was discussed in detail.  See after visit summary for additional details.   ?  ?Discussed post-op pain management in detail including the aspects of the enhanced recovery pathway. We discussed the use of tylenol post-op and to monitor for a maximum of 4,000 mg in a 24 hour period. Discussed bowel regimen in detail.   ?  ?Discussed the use of SCDs and measures to take at home to prevent DVT including frequent mobility.  Reportable signs and symptoms of DVT discussed. Post-operative instructions discussed and expectations for after surgery.  ?   ?5 minutes spent with the patient.  Verbalizing understanding of material discussed. No needs or concerns voiced at the end of the visit. Advised patient to call for any needs. Advised to proceed as planned with new patient consultation with Dr. [name] in the am with the procedure planned for the afternoon.   ? ?This appointment is included in the global surgical bundle as pre-operative teaching and has no charge.     ?

## 2022-01-15 NOTE — Interval H&P Note (Signed)
History and Physical Interval Note: ? ?01/15/2022 ?11:38 AM ? ?Sherri Gill  has presented today for surgery, with the diagnosis of THICKENED ENDOMETRIUM ?METASTATIC ADNOCARCINOMA.  The various methods of treatment have been discussed with the patient and family. After consideration of risks, benefits and other options for treatment, the patient has consented to  Procedure(s) with comments: ?EXAM UNDER ANESTHESIA (N/A) ?DILATATION & CURETTAGE/POSSIBLE HYSTEROSCOPY WITH MYOSURE (N/A) - DO NOT OPEN HYSTEROSCOPY KIT ?OPERATIVE ULTRASOUND (N/A) as a surgical intervention.  The patient's history has been reviewed, patient examined, no change in status, stable for surgery.  I have reviewed the patient's chart and labs.  Questions were answered to the patient's satisfaction.   ? ? ?Lafonda Mosses ? ? ?

## 2022-01-15 NOTE — Assessment & Plan Note (Signed)
The cause of nausea is multifactorial, could be related to carcinomatosis or side effects of oral iron supplement ?I recommend discontinuation of oral iron supplement ?I gave her a prescription of Zofran and Compazine to take as needed ?We discussed the importance of regular laxatives to avoid constipation as a contributing factor to chronic nausea ?Due to risk of dehydration, I recommend discontinuation of chlorthalidone ?

## 2022-01-15 NOTE — Anesthesia Preprocedure Evaluation (Addendum)
Anesthesia Evaluation  ?Patient identified by MRN, date of birth, ID band ?Patient awake ? ? ? ?Reviewed: ?Allergy & Precautions, NPO status , Patient's Chart, lab work & pertinent test results ? ?History of Anesthesia Complications ?Negative for: history of anesthetic complications ? ?Airway ?Mallampati: I ? ?TM Distance: >3 FB ?Neck ROM: Full ? ? ? Dental ? ?(+) Dental Advisory Given ?  ?Pulmonary ?shortness of breath, COPD (has home O2, is not using),  COPD inhaler, former smoker,  ?  ?breath sounds clear to auscultation ? ? ? ? ? ? Cardiovascular ?hypertension, (-) angina ?Rhythm:Regular Rate:Normal ? ? ?  ?Neuro/Psych ?Anxiety Depression   ? GI/Hepatic ?GERD  Medicated and Controlled,Required paracentesis for metastatic adenoCa ?  ?Endo/Other  ?diabetes (glu 100), Insulin Dependentobesity ? Renal/GU ?Renal InsufficiencyRenal disease  ? ?  ?Musculoskeletal ? ? Abdominal ?(+) + obese,   ?Peds ? Hematology ? ?(+) Blood dyscrasia, anemia ,   ?Anesthesia Other Findings ? ? Reproductive/Obstetrics ?Metastatic adenoca ? ?  ? ? ? ? ? ? ? ? ? ? ? ? ? ?  ?  ? ? ? ? ? ? ? ?Anesthesia Physical ?Anesthesia Plan ? ?ASA: 4 ? ?Anesthesia Plan: General  ? ?Post-op Pain Management: Tylenol PO (pre-op)*  ? ?Induction: Intravenous ? ?PONV Risk Score and Plan: 3 and Ondansetron, Dexamethasone and Treatment may vary due to age or medical condition ? ?Airway Management Planned: Oral ETT ? ?Additional Equipment: None ? ?Intra-op Plan:  ? ?Post-operative Plan: Extubation in OR ? ?Informed Consent: I have reviewed the patients History and Physical, chart, labs and discussed the procedure including the risks, benefits and alternatives for the proposed anesthesia with the patient or authorized representative who has indicated his/her understanding and acceptance.  ? ? ? ?Dental advisory given ? ?Plan Discussed with: CRNA and Surgeon ? ?Anesthesia Plan Comments:   ? ? ? ? ? ?Anesthesia Quick Evaluation ? ?

## 2022-01-15 NOTE — Anesthesia Procedure Notes (Signed)
Procedure Name: Intubation ?Date/Time: 01/15/2022 1:31 PM ?Performed by: Victoriano Lain, CRNA ?Pre-anesthesia Checklist: Patient identified, Emergency Drugs available, Suction available, Patient being monitored and Timeout performed ?Patient Re-evaluated:Patient Re-evaluated prior to induction ?Oxygen Delivery Method: Circle system utilized ?Preoxygenation: Pre-oxygenation with 100% oxygen ?Induction Type: IV induction, Rapid sequence and Cricoid Pressure applied ?Laryngoscope Size: Mac and 4 ?Grade View: Grade I ?Tube size: 7.0 mm ?Number of attempts: 1 ?Airway Equipment and Method: Stylet ?Placement Confirmation: ETT inserted through vocal cords under direct vision, positive ETCO2 and breath sounds checked- equal and bilateral ?Secured at: 21 cm ?Tube secured with: Tape ?Dental Injury: Teeth and Oropharynx as per pre-operative assessment  ?Comments: RSI with cricoid pressure by Dr Glennon Mac ? ? ? ? ?

## 2022-01-15 NOTE — Patient Instructions (Signed)
Plan to proceed with your consultation with Dr. Alvy Bimler as scheduled for tomorrow am. Your procedure will be in the afternoon starting around 3pm. ?  ?Preparing for your Surgery ?  ?Plan for surgery on January 15, 2022 with Dr. Jeral Pinch at [place] will be scheduled for examination under anesthesia, dilation and curettage of the uterus (dilating the cervix and taking a sample from the lining), possible hysteroscopy (using a camera), possible ultrasound guidance.  ?  ?Pre-operative Testing ?-You will receive a phone call from presurgical testing at [facility] to discuss surgery instructions and arrange for lab work if needed. ?  ?-Bring your insurance card, copy of an advanced directive if applicable, medication list. ?  ?-You should not be taking blood thinners or aspirin at least ten days prior to surgery unless instructed by your surgeon. ?  ?-Do not take supplements such as fish oil (omega 3), red yeast rice, turmeric before your surgery. You want to avoid medications with aspirin in them including headache powders such as BC or Goody's), Excedrin migraine. ?  ?Day Before Surgery at Home ?-You will be advised you can have clear liquids up until 3 hours before your surgery.   ?  ?Your role in recovery ?Your role is to become active as soon as directed by your doctor, while still giving yourself time to heal.  Rest when you feel tired. You will be asked to do the following in order to speed your recovery: ?  ?- Cough and breathe deeply. This helps to clear and expand your lungs and can prevent pneumonia after surgery.  ?- STAY ACTIVE WHEN YOU GET HOME. Do mild physical activity. Walking or moving your legs help your circulation and body functions return to normal. Do not try to get up or walk alone the first time after surgery.   ?-If you develop swelling on one leg or the other, pain in the back of your leg, redness/warmth in one of your legs, please call the office or go to the  Emergency Room to have a doppler to rule out a blood clot. For shortness of breath, chest pain-seek care in the Emergency Room as soon as possible. ?- Actively manage your pain. Managing your pain lets you move in comfort. We will ask you to rate your pain on a scale of zero to 10. It is your responsibility to tell your doctor or nurse where and how much you hurt so your pain can be treated. ?  ?Special Considerations ?-Your final pathology results from surgery should be available around one week after surgery and the results will be relayed to you when available. ?  ?-FMLA forms can be faxed to [phone] and please allow 5-7 business days for completion. ?  ?Pain Management After Surgery ?-Make sure that you have Tylenol and Ibuprofen at home to use on a regular basis after surgery for pain control. We recommend alternating the medications every hour to six hours since they work differently and are processed in the body differently for pain relief. ?  ?Bowel Regimen ?-It is important to prevent constipation and drink adequate amounts of liquids.  ?  ?Risks of Surgery ?Risks of surgery are low but include bleeding, infection, damage to surrounding structures, re-operation, blood clots, and very rarely death. ?  ?AFTER SURGERY INSTRUCTIONS ?  ?Return to work:  1-2 days if applicable ?  ?Activity: ?1. Be up and out of the bed during the day.  Take a nap if needed.  You may walk up steps but be careful and use the hand rail.  Stair climbing will tire you more than you think, you may need to stop part way and rest.  ?  ?2. No lifting or straining for 2 weeks over 10 pounds. No pushing, pulling, straining for 2 weeks. ?  ?3. No driving for minimum 24 hours after surgery.  Do not drive if you are taking narcotic pain medicine and make sure that your reaction time has returned.  ?  ?4. You can shower as soon as the next day after surgery. Shower daily. No tub baths or submerging your body in water until cleared by your  surgeon. If you have the soap that was given to you by pre-surgical testing that was used before surgery, you do not need to use it afterwards because this can irritate your incisions.  ?  ?5. No sexual activity and nothing in the vagina for 2-4 weeks. ?  ?6. You may experience vaginal spotting and discharge after surgery.  The spotting is normal but if you experience heavy bleeding, call our office. ?  ?7. Take Tylenol or ibuprofen for pain.  Monitor your Tylenol intake to a max of 4,000 mg in a 24 hour period.  ?  ?Diet: ?1. Low sodium Heart Healthy Diet is recommended but you are cleared to resume your normal (before surgery) diet after your procedure. ?  ?2. It is safe to use a laxative, such as Miralax or Colace, if you have difficulty moving your bowels. You can obtain senna-S over the counter and take two tablets at nighttime after surgery for the first few days to prevent straining to have a bowel movement. ?  ?Wound Care: ?1. Keep clean and dry.  Shower daily. ?  ?Reasons to call the Doctor: ?Fever - Oral temperature greater than 100.4 degrees Fahrenheit ?Foul-smelling vaginal discharge ?Difficulty urinating ?Nausea and vomiting ?Increased pain at the site of the incision that is unrelieved with pain medicine. ?Difficulty breathing with or without chest pain ?New calf pain especially if only on one side ?Sudden, continuing increased vaginal bleeding with or without clots. ?  ?Contacts: ?For questions or concerns you should contact: ?  ?Dr. Jeral Pinch at [phone] ?  ?[name], NP at [phone] ?  ?After Hours: call [phone] and have the GYN Oncologist paged/contacted (after 5 pm or on the weekends). ?  ?Messages sent via mychart are for non-urgent matters and are not responded to after hours so for urgent needs, please call the after hours number. ?

## 2022-01-15 NOTE — Assessment & Plan Note (Signed)
The most likely cause of her anemia is due to chronic blood loss/malabsorption syndrome. ?We discussed some of the risks, benefits, and alternatives of intravenous iron infusions. The patient is symptomatic from anemia and the iron level is critically low. She tolerated oral iron supplement poorly and desires to achieved higher levels of iron faster for adequate hematopoesis. Some of the side-effects to be expected including risks of infusion reactions, phlebitis, headaches, nausea and fatigue.  The patient is willing to proceed. Patient education material was dispensed.  ?Goal is to keep ferritin level greater than 50 and resolution of anemia ?Due to poor tolerance to oral iron, I have discontinue oral iron supplement ? ?

## 2022-01-15 NOTE — Progress Notes (Signed)
START ON PATHWAY REGIMEN - Uterine ? ? ?  A cycle is every 21 days: ?    Paclitaxel  ?    Carboplatin  ? ?**Always confirm dose/schedule in your pharmacy ordering system** ? ?Patient Characteristics: ?Serous Carcinoma, Newly Diagnosed (Clinical Staging), Nonsurgical Candidate, Stage III or IV, HER2 Negative/Unknown ?Histology: Serous Carcinoma ?Therapeutic Status: Newly Diagnosed (Clinical Staging) ?AJCC M Category: cM0 ?Surgical Candidacy: Nonsurgical Candidate ?AJCC 8 Stage Grouping: III ?AJCC T Category: cT3 ?AJCC N Category: cN0 ?HER2 Status: Awaiting Test Results ? ?Intent of Therapy: ?Curative Intent, Discussed with Patient ?

## 2022-01-15 NOTE — Discharge Instructions (Addendum)
AFTER SURGERY INSTRUCTIONS ?  ?Return to work:  1-2 days if applicable ?  ?Activity: ?1. Be up and out of the bed during the day.  Take a nap if needed.  You may walk up steps but be careful and use the hand rail.  Stair climbing will tire you more than you think, you may need to stop part way and rest.  ?  ?2. No lifting or straining for 2 weeks over 10 pounds. No pushing, pulling, straining for 2 weeks. ?  ?3. No driving for minimum 24 hours after surgery.  Do not drive if you are taking narcotic pain medicine and make sure that your reaction time has returned.  ?  ?4. You can shower as soon as the next day after surgery. Shower daily. No tub baths or submerging your body in water until cleared by your surgeon. If you have the soap that was given to you by pre-surgical testing that was used before surgery, you do not need to use it afterwards because this can irritate your incisions.  ?  ?5. No sexual activity and nothing in the vagina for 2-4 weeks. ?  ?6. You may experience vaginal spotting and discharge after surgery.  The spotting is normal but if you experience heavy bleeding, call our office. ?  ?7. Take Tylenol or ibuprofen for pain.  Monitor your Tylenol intake to a max of 4,000 mg in a 24 hour period.  ?  ?Diet: ?1. Low sodium Heart Healthy Diet is recommended but you are cleared to resume your normal (before surgery) diet after your procedure. ?  ?2. It is safe to use a laxative, such as Miralax or Colace, if you have difficulty moving your bowels. You can obtain senna-S over the counter and take two tablets at nighttime after surgery for the first few days to prevent straining to have a bowel movement. ?  ?Wound Care: ?1. Keep clean and dry.  Shower daily. ?  ?Reasons to call the Doctor: ?Fever - Oral temperature greater than 100.4 degrees Fahrenheit ?Foul-smelling vaginal discharge ?Difficulty urinating ?Nausea and vomiting ?Difficulty breathing with or without chest pain ?New calf pain especially if  only on one side ?Sudden, continuing increased vaginal bleeding with or without clots. ?  ?Contacts: ?For questions or concerns you should contact: ?  ?Dr. Jeral Pinch at [phone] ?  ?[name], NP at [phone] ?  ?After Hours: call [phone] and have the GYN Oncologist paged/contacted (after 5 pm or on the weekends). ?  ?Messages sent via mychart are for non-urgent matters and are not responded to after hours so for urgent needs, please call the after hours number. ?  ?

## 2022-01-16 ENCOUNTER — Encounter (HOSPITAL_COMMUNITY): Payer: Self-pay | Admitting: Gynecologic Oncology

## 2022-01-16 ENCOUNTER — Telehealth: Payer: Self-pay | Admitting: Hematology and Oncology

## 2022-01-16 ENCOUNTER — Telehealth: Payer: Self-pay

## 2022-01-16 LAB — CEA: CEA: 0.9 ng/mL (ref 0.0–4.7)

## 2022-01-16 LAB — CA 125: Cancer Antigen (CA) 125: 892 U/mL — ABNORMAL HIGH (ref 0.0–38.1)

## 2022-01-16 NOTE — Telephone Encounter (Signed)
Attempted to reach patient to check in with her post-operatively. Unable to reach patient and unable to leave voicemail. Will attempt to contact patient again.  ?

## 2022-01-16 NOTE — Telephone Encounter (Addendum)
[employer] 2021-05 - Specimen Collection Study to Evaluate Biomarkers in Subjects with Cancer  ? ?Attempted to reach Sherri Gill by phone to discuss study participation. No answer, and voicemail has not been set up. I note that she has chemo ed late tomorrow afternoon, and she has a procedure with contrast on Friday 01/18/22. ? ?If she is interested in enrolling in this study, our team would be happy to meet with her Monday 01/21/22 for consent. However, labs have to be at least 7 days after her last biopsy, so we couldn't draw study labs until 01/22/22. We could also meet later in the week, but we would have to have consent and study labs drawn before initiation of chemo (planned 3/31). ? ?I will reach out to the chemo ed team to let them know we are trying to reach the patient. While our team will be gone by the time she finishes her appt with them tomorrow, hopefully they can pass on our contact information to her. ? ?Sherri Gill Sherri Graves, RN, BSN, CHPN ?She  Her  [facility] ?Clinical Research Nurse ?[place] ?Direct Dial [phone]  Pager [phone] ?01/16/2022 2:49 PM ? ?

## 2022-01-16 NOTE — Telephone Encounter (Signed)
.  Scheduled appointment per 03/20 los. Patient aware. ? ?

## 2022-01-17 ENCOUNTER — Other Ambulatory Visit: Payer: Self-pay

## 2022-01-17 ENCOUNTER — Telehealth: Payer: Self-pay | Admitting: Gynecologic Oncology

## 2022-01-17 ENCOUNTER — Other Ambulatory Visit: Payer: Self-pay | Admitting: Radiology

## 2022-01-17 ENCOUNTER — Inpatient Hospital Stay: Payer: Medicare HMO

## 2022-01-17 NOTE — Telephone Encounter (Signed)
Called patient to discuss pathology results from biopsy.  This confirms uterine serous carcinoma.  I have already asked pathology to add HER2 testing.  I reviewed that these testing results may make her a candidate for an additional type of treatment besides what she and Dr. Alvy Bimler have already reviewed. ? ?Jeral Pinch MD ?Gynecologic Oncology ? ?

## 2022-01-18 ENCOUNTER — Other Ambulatory Visit: Payer: Self-pay

## 2022-01-18 ENCOUNTER — Encounter (HOSPITAL_COMMUNITY): Payer: Self-pay

## 2022-01-18 ENCOUNTER — Ambulatory Visit (HOSPITAL_COMMUNITY)
Admit: 2022-01-18 | Discharge: 2022-01-18 | Disposition: A | Discharge status: Home / Self Care | Payer: Medicare HMO | Attending: Hematology and Oncology | Admitting: Hematology and Oncology

## 2022-01-18 ENCOUNTER — Ambulatory Visit (HOSPITAL_COMMUNITY)
Admission: RE | Admit: 2022-01-18 | Discharge: 2022-01-18 | Disposition: A | Discharge status: Home / Self Care | Payer: Medicare HMO | Source: Ambulatory Visit | Attending: Hematology and Oncology | Admitting: Hematology and Oncology

## 2022-01-18 DIAGNOSIS — Z7985 Long-term (current) use of injectable non-insulin antidiabetic drugs: Secondary | ICD-10-CM | POA: Insufficient documentation

## 2022-01-18 DIAGNOSIS — Z79899 Other long term (current) drug therapy: Secondary | ICD-10-CM | POA: Diagnosis not present

## 2022-01-18 DIAGNOSIS — E119 Type 2 diabetes mellitus without complications: Secondary | ICD-10-CM | POA: Insufficient documentation

## 2022-01-18 DIAGNOSIS — C55 Malignant neoplasm of uterus, part unspecified: Secondary | ICD-10-CM | POA: Diagnosis not present

## 2022-01-18 DIAGNOSIS — Z87891 Personal history of nicotine dependence: Secondary | ICD-10-CM | POA: Insufficient documentation

## 2022-01-18 DIAGNOSIS — I10 Essential (primary) hypertension: Secondary | ICD-10-CM | POA: Insufficient documentation

## 2022-01-18 DIAGNOSIS — Z452 Encounter for adjustment and management of vascular access device: Secondary | ICD-10-CM | POA: Diagnosis not present

## 2022-01-18 HISTORY — PX: IR IMAGING GUIDED PORT INSERTION: IMG5740

## 2022-01-18 LAB — CYTOLOGY - PAP
Adequacy: ABSENT
Comment: NEGATIVE
Diagnosis: NEGATIVE
High risk HPV: NEGATIVE

## 2022-01-18 LAB — GLUCOSE, CAPILLARY: Glucose-Capillary: 97 mg/dL (ref 70–99)

## 2022-01-18 MED ORDER — LIDOCAINE HCL (PF) 1 % IJ SOLN
INTRAMUSCULAR | Status: AC | PRN
  Administered 2022-01-18: 10 mL via INTRADERMAL

## 2022-01-18 MED ORDER — FENTANYL CITRATE (PF) 100 MCG/2ML IJ SOLN
INTRAMUSCULAR | Status: AC
  Filled 2022-01-18: qty 2

## 2022-01-18 MED ORDER — MIDAZOLAM HCL 2 MG/2ML IJ SOLN
INTRAMUSCULAR | Status: AC
  Filled 2022-01-18: qty 2

## 2022-01-18 MED ORDER — MIDAZOLAM HCL 2 MG/2ML IJ SOLN
INTRAMUSCULAR | Status: AC | PRN
  Administered 2022-01-18: 1 mg via INTRAVENOUS

## 2022-01-18 MED ORDER — FENTANYL CITRATE (PF) 100 MCG/2ML IJ SOLN
INTRAMUSCULAR | Status: AC | PRN
  Administered 2022-01-18: 50 ug via INTRAVENOUS

## 2022-01-18 MED ORDER — LIDOCAINE-EPINEPHRINE 1 %-1:100000 IJ SOLN
INTRAMUSCULAR | Status: AC
  Filled 2022-01-18: qty 1

## 2022-01-18 MED ORDER — MIDAZOLAM HCL 2 MG/2ML IJ SOLN: INTRAMUSCULAR | Status: AC | PRN

## 2022-01-18 MED ORDER — SODIUM CHLORIDE 0.9 % IV SOLN: INTRAVENOUS | Status: DC

## 2022-01-18 MED ORDER — HEPARIN SOD (PORK) LOCK FLUSH 100 UNIT/ML IV SOLN
INTRAVENOUS | Status: AC
  Filled 2022-01-18: qty 5

## 2022-01-18 MED ORDER — HEPARIN SOD (PORK) LOCK FLUSH 100 UNIT/ML IV SOLN
INTRAVENOUS | Status: AC | PRN
  Administered 2022-01-18: 500 [IU] via INTRAVENOUS

## 2022-01-18 NOTE — Discharge Instructions (Addendum)
Please call Interventional Radiology clinic [phone] with any questions or concerns. ? ?You may remove your dressing and shower tomorrow. May replace old dressing with bandaids as needed. ? ?DO NOT use EMLA cream (or other creams/ ointments) on your port site for 2 weeks as this cream will remove surgical glue on your incision. ? ?Do not submerge site in water until healed. ? ?Implanted Port Insertion, Care After ?This sheet gives you information about how to care for yourself after your procedure. Your health care provider may also give you more specific instructions. If you have problems or questions, contact your health care provider. ?What can I expect after the procedure? ?After the procedure, it is common to have: ?Discomfort at the port insertion site. ?Bruising on the skin over the port. This should improve over 3-4 days. ?Follow these instructions at home: ?Port care ?After your port is placed, you will get a manufacturer's information card. The card has information about your port. Keep this card with you at all times. ?Take care of the port as told by your health care provider. Ask your health care provider if you or a family member can get training for taking care of the port at home. A home health care nurse may also take care of the port. ?Make sure to remember what type of port you have. ?Incision care ?Follow instructions from your health care provider about how to take care of your port insertion site. Make sure you: ?Wash your hands with soap and water before and after you change your bandage (dressing). If soap and water are not available, use hand sanitizer. ?Change your dressing as told by your health care provider. ?Leave stitches (sutures), skin glue, or adhesive strips in place. These skin closures may need to stay in place for 2 weeks or longer. If adhesive strip edges start to loosen and curl up, you may trim the loose edges. Do not remove adhesive strips completely unless your health  care provider tells you to do that. ?Check your port insertion site every day for signs of infection. Check for: ?Redness, swelling, or pain. ?Fluid or blood. ?Warmth. ?Pus or a bad smell.  ? ?  ? ?  ?Activity ?Return to your normal activities as told by your health care provider. Ask your health care provider what activities are safe for you. ?Do not lift anything that is heavier than 10 lb (4.5 kg), or the limit that you are told, until your health care provider says that it is safe. ?General instructions ?Take over-the-counter and prescription medicines only as told by your health care provider. ?Do not take baths, swim, or use a hot tub until your health care provider approves. Ask your health care provider if you may take showers. You may only be allowed to take sponge baths. ?Do not drive for 24 hours if you were given a sedative during your procedure. ?Wear a medical alert bracelet in case of an emergency. This will tell any health care providers that you have a port. ?Keep all follow-up visits as told by your health care provider. This is important. ?Contact a health care provider if: ?You cannot flush your port with saline as directed, or you cannot draw blood from the port. ?You have a fever or chills. ?You have redness, swelling, or pain around your port insertion site. ?You have fluid or blood coming from your port insertion site. ?Your port insertion site feels warm to the touch. ?You have pus or a bad smell coming from  the port insertion site. ?Get help right away if: ?You have chest pain or shortness of breath. ?You have bleeding from your port that you cannot control. ?Summary ?Take care of the port as told by your health care provider. Keep the manufacturer's information card with you at all times. ?Change your dressing as told by your health care provider. ?Contact a health care provider if you have a fever or chills or if you have redness, swelling, or pain around your port insertion site. ?Keep  all follow-up visits as told by your health care provider. ?This information is not intended to replace advice given to you by your health care provider. Make sure you discuss any questions you have with your health care provider. ?Document Revised: 05/12/2018 Document Reviewed: 05/12/2018 ?Elsevier Patient Education ? [place]. ? ? ?Moderate Conscious Sedation, Adult, Care After ?This sheet gives you information about how to care for yourself after your procedure. Your health care provider may also give you more specific instructions. If you have problems or questions, contact your health care provider. ?What can I expect after the procedure? ?After the procedure, it is common to have: ?Sleepiness for several hours. ?Impaired judgment for several hours. ?Difficulty with balance. ?Vomiting if you eat too soon. ?Follow these instructions at home: ?For the time period you were told by your health care provider: ?Rest. ?Do not participate in activities where you could fall or become injured. ?Do not drive or use machinery. ?Do not drink alcohol. ?Do not take sleeping pills or medicines that cause drowsiness. ?Do not make important decisions or sign legal documents. ?Do not take care of children on your own.  ? ?  ? ?  ?Eating and drinking ?Follow the diet recommended by your health care provider. ?Drink enough fluid to keep your urine pale yellow. ?If you vomit: ?Drink water, juice, or soup when you can drink without vomiting. ?Make sure you have little or no nausea before eating solid foods.  ?  ?General instructions ?Take over-the-counter and prescription medicines only as told by your health care provider. ?Have a responsible adult stay with you for the time you are told. It is important to have someone help care for you until you are awake and alert. ?Do not smoke. ?Keep all follow-up visits as told by your health care provider. This is important. ?Contact a health care provider if: ?You are still sleepy  or having trouble with balance after 24 hours. ?You feel light-headed. ?You keep feeling nauseous or you keep vomiting. ?You develop a rash. ?You have a fever. ?You have redness or swelling around the IV site. ?Get help right away if: ?You have trouble breathing. ?You have new-onset confusion at home. ?Summary ?After the procedure, it is common to feel sleepy, have impaired judgment, or feel nauseous if you eat too soon. ?Rest after you get home. Know the things you should not do after the procedure. ?Follow the diet recommended by your health care provider and drink enough fluid to keep your urine pale yellow. ?Get help right away if you have trouble breathing or new-onset confusion at home. ?This information is not intended to replace advice given to you by your health care provider. Make sure you discuss any questions you have with your health care provider. ?Document Revised: 02/11/2020 Document Reviewed: 09/09/2019 ?Elsevier Patient Education ? [place].  ?

## 2022-01-18 NOTE — Procedures (Signed)
Interventional Radiology Procedure Note  Procedure: Single Lumen Power Port Placement    Access:  Right IJ vein.  Findings: Catheter tip positioned at SVC/RA junction. Port is ready for immediate use.   Complications: None  EBL: < 10 mL  Recommendations:  - Ok to shower in 24 hours - Do not submerge for 7 days - Routine line care   Ercelle Winkles T. Vermon Grays, M.D Pager:  [phone]   

## 2022-01-18 NOTE — H&P (Signed)
? ?Chief Complaint: ?Patient was seen in consultation today for image guided PAC placement at the request of [place] ? ?Referring Physician(s): Gorsuch,Ni ? ?Supervising Physician: Aletta Edouard ? ?Patient Status: [place] ? ?History of Present Illness: ?Sherri Gill is a 71 y.o. female with PMHs of HTN, DM type 2, chronic anemia, ascites s/p paracentesis on 12/29/21 and recent diagnosis of uterine CA, biopsy proven on 01/15/22.  ? ?Patient was recently hospitalized from 3/3 to 3/6 due to abd pain and AKI, found to have a large volume ascites on CT on 12/28/21, underwent paracentesis and peritoneal fluid showed malignant cells consistent with adenocarcinoma.  ? ?Patient was referred to GYN onc, underwent D&C and hysteroscopy, pathology revealed serous endometrial carcinoma. Patient was referred to medical oncology and established care with Dr. Alvy Bimler, who recommended systemic chemotherapy for treatment of the uterine CA.  ? ?IR was requested for image guided PAC placement.  ? ?Patient laying in bed, not in acute distress.  ?[name] headache, fever, chills, shortness of breath, cough, chest pain, abdominal pain, nausea ,vomiting, and bleeding. ? ? ? ?Past Medical History:  ?Diagnosis Date  ? AKI (acute kidney injury) ([place])   ? History of  ? Ascites   ? 09/2021 paracentesis with almost 3 L of fluid 12/2021 Paracentesis performed with 2.8 L  ? Chronic anemia 08/29/2012  ? Need colonoscopy or report. Need iron panel.   ? Dyslipidemia 06/30/2007  ? Dyspnea   ? Essential hypertension, benign 06/30/2007  ? GERD 06/30/2007  ? History of blood transfusion   ? History of fatty infiltration of liver   ? History of migraine   ? Hx of Herpes simplex meningitis 2015  ? Also noted to have primary empty sella on imaging at this admission  ? Insomnia 06/11/2012  ? Major depressive disorder, recurrent episode, moderate with anxious distress ([place]) 06/30/2007  ? Nausea and vomiting 01/13/2022  ? Obesity, BMI 35-40 06/11/2012   ? Peripheral neuropathy 2/2 T2DM 12/28/2009  ? Primary empty sella syndrome ([place]) 2015  ? Noted on imaging during hospitalization for herpes meningitis; no pituitary mass, no hormone w/u at that time, hormonally asymptomatic  ? Type 2 diabetes mellitus with neurological complications ([place]) 93/23/5573  ? ? ?Past Surgical History:  ?Procedure Laterality Date  ? COLONOSCOPY W/ POLYPECTOMY  06/28/2004  ? DENTAL SURGERY    ? DILATATION & CURETTAGE/HYSTEROSCOPY WITH MYOSURE N/A 01/15/2022  ? Procedure: [place];  Surgeon: Lafonda Mosses, MD;  Location: [facility];  Service: Gynecology;  Laterality: N/A;  DO NOT OPEN HYSTEROSCOPY KIT  ? OPERATIVE ULTRASOUND N/A 01/15/2022  ? Procedure: OPERATIVE ULTRASOUND;  Surgeon: Lafonda Mosses, MD;  Location: [facility];  Service: Gynecology;  Laterality: N/A;  ? TUBAL LIGATION    ? ? ?Allergies: ?Codeine sulfate, Penicillins, Percocet [oxycodone-acetaminophen], Codeine, and Lisinopril ? ?Medications: ?Prior to Admission medications   ?Medication Sig Start Date End Date Taking? Authorizing Provider  ?acetaminophen (TYLENOL) 500 MG tablet Take 1,000 mg by mouth every 6 (six) hours as needed for mild pain.    [provider]  ?albuterol (VENTOLIN HFA) 108 (90 Base) MCG/ACT inhaler Inhale 2 puffs into the lungs every 4 (four) hours as needed for wheezing or shortness of breath. 12/31/21   [name], MD  ?amLODipine (NORVASC) 10 MG tablet Take 1 tablet (10 mg total) by mouth daily. ?Patient taking differently: Take 10 mg by mouth at bedtime. 05/31/21   [name], [name], DO  ?atorvastatin (LIPITOR) 20 MG  tablet Take 1 tablet (20 mg total) by mouth daily. ?Patient taking differently: Take 20 mg by mouth at bedtime. 05/31/21 12/29/22  [name], [name], DO  ?Blood Glucose Monitoring Suppl (ONE TOUCH ULTRA MINI) w/Device KIT Please use as directed. 12/20/16   [name], MD  ?Cholecalciferol 1000 units capsule Take 1 capsule (1,000 Units total)  by mouth daily. 11/08/16   [name], MD  ?dexamethasone (DECADRON) 4 MG tablet Take 2 tablets by mouth the night before and 2 tablets the morning of chemotherapy, every 3 weeks for 6 cycles 01/15/22   [name], MD  ?dorzolamide-timolol (COSOPT) 22.3-6.8 MG/ML ophthalmic solution Place 1 drop into both eyes 2 (two) times daily. 12/15/21   [provider]  ?glucose blood (ONE TOUCH ULTRA TEST) test strip Use as instructed 12/20/16   [name], MD  ?Insulin Pen Needle 32G X 4 MM MISC 1 each by Does not apply route daily. 09/01/19   [name], [name], MD  ?latanoprost (XALATAN) 0.005 % ophthalmic solution Place 1 drop into both eyes at bedtime. 12/15/21   [provider]  ?lidocaine-prilocaine (EMLA) cream Apply to affected area once 01/15/22   [name], MD  ?liraglutide (VICTOZA) 18 MG/3ML SOPN PLEASE START TAKING VICTOZA 0.6MG DAILY WEEK 1, THEN TAKE 1.2MG DAILY WEEK 2, AND THEN TAKE 1.8MG DAILY THEREAFTER ?Patient taking differently: Inject 1.2 mg into the skin daily. 05/31/21   [name], [name], DO  ?losartan (COZAAR) 100 MG tablet Take 100 mg by mouth daily. 12/31/21   [provider]  ?ondansetron (ZOFRAN) 8 MG tablet Take 1 tablet (8 mg total) by mouth 2 (two) times daily as needed. Start on the third day after chemotherapy. 01/15/22   [name], MD  ?[name] LANCETS 82N MISC Please use as directed. 12/20/16   [name], MD  ?polyethylene glycol powder (GLYCOLAX/MIRALAX) 17 GM/SCOOP powder Take 17 g by mouth daily. 01/15/22   [name], MD  ?prochlorperazine (COMPAZINE) 10 MG tablet Take 1 tablet (10 mg total) by mouth every 6 (six) hours as needed (Nausea or vomiting). 01/15/22   [name], MD  ?traMADol (ULTRAM) 50 MG tablet Take 1 tablet (50 mg total) by mouth every 6 (six) hours as needed for moderate pain. 01/11/22   [name], MD  ?  ? ?Family History  ?Adopted: Yes  ?Problem Relation Age of Onset  ? Dementia Mother   ? Thyroid cancer Maternal Aunt    ? Prostate cancer Cousin   ? ? ?Social History  ? ?Socioeconomic History  ? Marital status: Single  ?  Spouse name: Not on file  ? Number of children: Not on file  ? Years of education: 27  ? Highest education level: Not on file  ?Occupational History  ? Occupation: Press photographer  ?  Employer: [employer]  ?Tobacco Use  ? Smoking status: Former  ?  Types: Cigarettes  ?  Quit date: 10/28/1978  ?  Years since quitting: 43.2  ? Smokeless tobacco: Never  ?Vaping Use  ? Vaping Use: Never used  ?Substance and Sexual Activity  ? Alcohol use: No  ?  Alcohol/week: 0.0 standard drinks  ? Drug use: No  ? Sexual activity: Not Currently  ?  Partners: Male  ?  Birth control/protection: Post-menopausal  ?Other Topics Concern  ? Not on file  ?Social History Narrative  ? Not on file  ? ?Social Determinants of Health  ? ?Financial Resource Strain: Not on file  ?Food Insecurity: Not on  file  ?Transportation Needs: Not on file  ?Physical Activity: Not on file  ?Stress: Not on file  ?Social Connections: Not on file  ? ? ? ?Review of Systems: A 12 point ROS discussed and pertinent positives are indicated in the HPI above.  All other systems are negative. ? ?Vital Signs: ?BP 134/69   Pulse 75   Temp 98 ?F (36.7 ?C) (Oral)   Resp 19   Ht 5' 3"  (1.6 m)   Wt 178 lb (80.7 kg)   SpO2 96%   BMI 31.53 kg/m?  ? ? ?Physical Exam ?Vitals reviewed.  ?Constitutional:   ?   General: She is not in acute distress. ?   Appearance: Normal appearance. She is not ill-appearing.  ?HENT:  ?   Head: Normocephalic and atraumatic.  ?   Mouth/Throat:  ?   Mouth: Mucous membranes are moist.  ?Cardiovascular:  ?   Rate and Rhythm: Normal rate and regular rhythm.  ?   Heart sounds: Normal heart sounds.  ?Pulmonary:  ?   Effort: Pulmonary effort is normal.  ?   Breath sounds: Normal breath sounds.  ?Abdominal:  ?   General: There is distension.  ?Skin: ?   General: Skin is warm and dry.  ?   Coloration: Skin is not jaundiced or pale.  ?Neurological:  ?   Mental Status: She  is alert and oriented to person, place, and time.  ?Psychiatric:     ?   Mood and Affect: Mood normal.     ?   Behavior: Behavior normal.     ?   Judgment: Judgment normal.  ? ? ?MD Evaluation ?Airway: WNL ?H

## 2022-01-18 NOTE — Progress Notes (Signed)
Pharmacist Chemotherapy Monitoring - Initial Assessment   ? ?Anticipated start date: 01/25/22  ? ?The following has been reviewed per standard work regarding the patient's treatment regimen: ?The patient's diagnosis, treatment plan and drug doses, and organ/hematologic function ?Lab orders and baseline tests specific to treatment regimen  ?The treatment plan start date, drug sequencing, and pre-medications ?Prior authorization status  ?Patient's documented medication list, including drug-drug interaction screen and prescriptions for anti-emetics and supportive care specific to the treatment regimen ?The drug concentrations, fluid compatibility, administration routes, and timing of the medications to be used ?The patient's access for treatment and lifetime cumulative dose history, if applicable  ?The patient's medication allergies and previous infusion related reactions, if applicable  ? ?Changes made to treatment plan:  ?N/A ? ?Follow up needed:  ?Pending authorization for treatment  ? ? ?Raul Del [place], [place], BCPS, BCOP ?01/18/2022  4:46 PM  ?

## 2022-01-18 NOTE — Research (Signed)
Trial:  [employer] 2021-05 - Specimen Collection Study to Evaluate Biomarkers in Subjects with Cancer  ?Patient Sherri Gill was identified by Dr Alvy Bimler as a potential candidate for the above listed study.  This Clinical Research Nurse met with Sherri Gill, [id number], on 01/17/22 in a manner and location that ensures patient privacy to discuss participation in the above listed research study.  Patient is Accompanied by her support friends .  A copy of the informed consent document with embedded HIPAA language was provided to the patient.  Patient reads, speaks, and understands [place].   ?Patient was provided with the business card of this Nurse and encouraged to contact the research team with any questions.  Approximately 15 minutes were spent with the patient reviewing the informed consent documents.  Patient was provided the option of taking informed consent documents home to review and was encouraged to review at their convenience with their support network, including other care providers. Patient took the consent documents home to review. ?Plan made for Research team to follow up on Monday 01/21/22. Due to protocol requirements, blood would have to be drawn on or after 01/22/22 but before pt starts chemo on 01/25/22. ? ?Marjie Skiff Keita Demarco, RN, BSN, CHPN ?She  Her  Hers ?Clinical Research Nurse ?[place] ?Direct Dial [phone]  Pager [phone] ?01/18/2022 2:49 PM ? ? ?

## 2022-01-19 DIAGNOSIS — N179 Acute kidney failure, unspecified: Secondary | ICD-10-CM | POA: Diagnosis not present

## 2022-01-19 DIAGNOSIS — K219 Gastro-esophageal reflux disease without esophagitis: Secondary | ICD-10-CM | POA: Diagnosis not present

## 2022-01-19 DIAGNOSIS — Z7952 Long term (current) use of systemic steroids: Secondary | ICD-10-CM | POA: Diagnosis not present

## 2022-01-19 DIAGNOSIS — Z79899 Other long term (current) drug therapy: Secondary | ICD-10-CM | POA: Diagnosis not present

## 2022-01-19 DIAGNOSIS — I10 Essential (primary) hypertension: Secondary | ICD-10-CM | POA: Diagnosis not present

## 2022-01-19 DIAGNOSIS — E669 Obesity, unspecified: Secondary | ICD-10-CM | POA: Diagnosis not present

## 2022-01-19 DIAGNOSIS — Z86010 Personal history of colonic polyps: Secondary | ICD-10-CM | POA: Diagnosis not present

## 2022-01-19 DIAGNOSIS — E785 Hyperlipidemia, unspecified: Secondary | ICD-10-CM | POA: Diagnosis not present

## 2022-01-19 DIAGNOSIS — R69 Illness, unspecified: Secondary | ICD-10-CM | POA: Diagnosis not present

## 2022-01-19 DIAGNOSIS — R188 Other ascites: Secondary | ICD-10-CM | POA: Diagnosis not present

## 2022-01-19 DIAGNOSIS — E871 Hypo-osmolality and hyponatremia: Secondary | ICD-10-CM | POA: Diagnosis not present

## 2022-01-19 DIAGNOSIS — D509 Iron deficiency anemia, unspecified: Secondary | ICD-10-CM | POA: Diagnosis not present

## 2022-01-19 DIAGNOSIS — K5730 Diverticulosis of large intestine without perforation or abscess without bleeding: Secondary | ICD-10-CM | POA: Diagnosis not present

## 2022-01-19 DIAGNOSIS — D72829 Elevated white blood cell count, unspecified: Secondary | ICD-10-CM | POA: Diagnosis not present

## 2022-01-19 DIAGNOSIS — E1165 Type 2 diabetes mellitus with hyperglycemia: Secondary | ICD-10-CM | POA: Diagnosis not present

## 2022-01-19 DIAGNOSIS — Z9851 Tubal ligation status: Secondary | ICD-10-CM | POA: Diagnosis not present

## 2022-01-19 DIAGNOSIS — E236 Other disorders of pituitary gland: Secondary | ICD-10-CM | POA: Diagnosis not present

## 2022-01-19 DIAGNOSIS — E1142 Type 2 diabetes mellitus with diabetic polyneuropathy: Secondary | ICD-10-CM | POA: Diagnosis not present

## 2022-01-19 DIAGNOSIS — Z87891 Personal history of nicotine dependence: Secondary | ICD-10-CM | POA: Diagnosis not present

## 2022-01-19 DIAGNOSIS — G4700 Insomnia, unspecified: Secondary | ICD-10-CM | POA: Diagnosis not present

## 2022-01-19 DIAGNOSIS — Z9981 Dependence on supplemental oxygen: Secondary | ICD-10-CM | POA: Diagnosis not present

## 2022-01-19 DIAGNOSIS — K429 Umbilical hernia without obstruction or gangrene: Secondary | ICD-10-CM | POA: Diagnosis not present

## 2022-01-19 DIAGNOSIS — Z6833 Body mass index (BMI) 33.0-33.9, adult: Secondary | ICD-10-CM | POA: Diagnosis not present

## 2022-01-21 ENCOUNTER — Telehealth: Payer: Self-pay | Admitting: Behavioral Health

## 2022-01-21 ENCOUNTER — Telehealth: Payer: Self-pay

## 2022-01-21 ENCOUNTER — Other Ambulatory Visit: Payer: Self-pay

## 2022-01-21 ENCOUNTER — Encounter: Payer: Self-pay | Admitting: Hematology and Oncology

## 2022-01-21 ENCOUNTER — Inpatient Hospital Stay: Payer: Medicare HMO | Admitting: Hematology and Oncology

## 2022-01-21 ENCOUNTER — Inpatient Hospital Stay: Payer: Medicare HMO

## 2022-01-21 ENCOUNTER — Other Ambulatory Visit: Payer: Self-pay | Admitting: Oncology

## 2022-01-21 ENCOUNTER — Inpatient Hospital Stay (HOSPITAL_BASED_OUTPATIENT_CLINIC_OR_DEPARTMENT_OTHER): Payer: Medicare HMO

## 2022-01-21 VITALS — BP 135/66 | HR 83 | Temp 98.7°F | Resp 18

## 2022-01-21 DIAGNOSIS — D509 Iron deficiency anemia, unspecified: Secondary | ICD-10-CM

## 2022-01-21 DIAGNOSIS — E785 Hyperlipidemia, unspecified: Secondary | ICD-10-CM | POA: Diagnosis not present

## 2022-01-21 DIAGNOSIS — Z6831 Body mass index (BMI) 31.0-31.9, adult: Secondary | ICD-10-CM | POA: Diagnosis not present

## 2022-01-21 DIAGNOSIS — C801 Malignant (primary) neoplasm, unspecified: Secondary | ICD-10-CM | POA: Diagnosis not present

## 2022-01-21 DIAGNOSIS — C55 Malignant neoplasm of uterus, part unspecified: Secondary | ICD-10-CM | POA: Diagnosis not present

## 2022-01-21 DIAGNOSIS — R9389 Abnormal findings on diagnostic imaging of other specified body structures: Secondary | ICD-10-CM | POA: Diagnosis not present

## 2022-01-21 DIAGNOSIS — R180 Malignant ascites: Secondary | ICD-10-CM

## 2022-01-21 DIAGNOSIS — E1142 Type 2 diabetes mellitus with diabetic polyneuropathy: Secondary | ICD-10-CM | POA: Diagnosis not present

## 2022-01-21 DIAGNOSIS — R69 Illness, unspecified: Secondary | ICD-10-CM | POA: Diagnosis not present

## 2022-01-21 DIAGNOSIS — K219 Gastro-esophageal reflux disease without esophagitis: Secondary | ICD-10-CM | POA: Diagnosis not present

## 2022-01-21 DIAGNOSIS — Z7985 Long-term (current) use of injectable non-insulin antidiabetic drugs: Secondary | ICD-10-CM | POA: Diagnosis not present

## 2022-01-21 DIAGNOSIS — E1169 Type 2 diabetes mellitus with other specified complication: Secondary | ICD-10-CM | POA: Diagnosis not present

## 2022-01-21 DIAGNOSIS — F339 Major depressive disorder, recurrent, unspecified: Secondary | ICD-10-CM | POA: Diagnosis not present

## 2022-01-21 DIAGNOSIS — I10 Essential (primary) hypertension: Secondary | ICD-10-CM | POA: Diagnosis not present

## 2022-01-21 DIAGNOSIS — Z794 Long term (current) use of insulin: Secondary | ICD-10-CM | POA: Diagnosis not present

## 2022-01-21 DIAGNOSIS — R102 Pelvic and perineal pain: Secondary | ICD-10-CM | POA: Diagnosis not present

## 2022-01-21 DIAGNOSIS — E669 Obesity, unspecified: Secondary | ICD-10-CM | POA: Diagnosis not present

## 2022-01-21 DIAGNOSIS — Z79899 Other long term (current) drug therapy: Secondary | ICD-10-CM | POA: Diagnosis not present

## 2022-01-21 LAB — CBC WITH DIFFERENTIAL (CANCER CENTER ONLY)
Abs Immature Granulocytes: 0.03 10*3/uL (ref 0.00–0.07)
Basophils Absolute: 0 10*3/uL (ref 0.0–0.1)
Basophils Relative: 0 %
Eosinophils Absolute: 0.3 10*3/uL (ref 0.0–0.5)
Eosinophils Relative: 3 %
HCT: 30.6 % — ABNORMAL LOW (ref 36.0–46.0)
Hemoglobin: 9.6 g/dL — ABNORMAL LOW (ref 12.0–15.0)
Immature Granulocytes: 0 %
Lymphocytes Relative: 15 %
Lymphs Abs: 1.3 10*3/uL (ref 0.7–4.0)
MCH: 23.6 pg — ABNORMAL LOW (ref 26.0–34.0)
MCHC: 31.4 g/dL (ref 30.0–36.0)
MCV: 75.2 fL — ABNORMAL LOW (ref 80.0–100.0)
Monocytes Absolute: 0.7 10*3/uL (ref 0.1–1.0)
Monocytes Relative: 8 %
Neutro Abs: 6.6 10*3/uL (ref 1.7–7.7)
Neutrophils Relative %: 74 %
Platelet Count: 376 10*3/uL (ref 150–400)
RBC: 4.07 MIL/uL (ref 3.87–5.11)
RDW: 17.5 % — ABNORMAL HIGH (ref 11.5–15.5)
WBC Count: 9 10*3/uL (ref 4.0–10.5)
nRBC: 0 % (ref 0.0–0.2)

## 2022-01-21 LAB — CMP (CANCER CENTER ONLY)
ALT: 9 U/L (ref 0–44)
AST: 13 U/L — ABNORMAL LOW (ref 15–41)
Albumin: 3.7 g/dL (ref 3.5–5.0)
Alkaline Phosphatase: 105 U/L (ref 38–126)
Anion gap: 7 (ref 5–15)
BUN: 14 mg/dL (ref 8–23)
CO2: 32 mmol/L (ref 22–32)
Calcium: 9.2 mg/dL (ref 8.9–10.3)
Chloride: 96 mmol/L — ABNORMAL LOW (ref 98–111)
Creatinine: 1.13 mg/dL — ABNORMAL HIGH (ref 0.44–1.00)
GFR, Estimated: 53 mL/min — ABNORMAL LOW (ref >60)
Glucose, Bld: 117 mg/dL — ABNORMAL HIGH (ref 70–99)
Potassium: 3.5 mmol/L (ref 3.5–5.1)
Sodium: 135 mmol/L (ref 135–145)
Total Bilirubin: 0.7 mg/dL (ref 0.3–1.2)
Total Protein: 7 g/dL (ref 6.5–8.1)

## 2022-01-21 MED ORDER — SODIUM CHLORIDE 0.9% FLUSH: 10.0000 mL | Freq: Once | INTRAVENOUS | Status: DC

## 2022-01-21 MED ORDER — ANTICOAGULANT SODIUM CITRATE LOCK FLUSH 4% (120 MG/3ML) IV SOLN: 5.0000 mL | PREFILLED_SYRINGE | Freq: Once | INTRAVENOUS | Status: DC

## 2022-01-21 MED ORDER — HEPARIN SOD (PORK) LOCK FLUSH 100 UNIT/ML IV SOLN: 250.0000 [IU] | Freq: Once | INTRAVENOUS | Status: DC

## 2022-01-21 MED ORDER — ALTEPLASE 2 MG IJ SOLR: 2.0000 mg | Freq: Once | INTRAMUSCULAR | Status: DC

## 2022-01-21 MED ORDER — HEPARIN SOD (PORK) LOCK FLUSH 100 UNIT/ML IV SOLN: 500.0000 [IU] | Freq: Once | INTRAVENOUS | Status: DC

## 2022-01-21 MED ORDER — SODIUM CHLORIDE 0.9% FLUSH
10.0000 mL | Freq: Once | INTRAVENOUS | Status: AC | PRN
  Administered 2022-01-21: 10 mL

## 2022-01-21 MED ORDER — SODIUM CHLORIDE 0.9 % IV SOLN: Freq: Once | INTRAVENOUS | Status: AC

## 2022-01-21 MED ORDER — HEPARIN SOD (PORK) LOCK FLUSH 100 UNIT/ML IV SOLN
500.0000 [IU] | Freq: Once | INTRAVENOUS | Status: AC | PRN
  Administered 2022-01-21: 500 [IU]

## 2022-01-21 MED ORDER — SODIUM CHLORIDE 0.9 % IV SOLN
200.0000 mg | Freq: Once | INTRAVENOUS | Status: AC
  Administered 2022-01-21: 200 mg via INTRAVENOUS
  Filled 2022-01-21: qty 200

## 2022-01-21 NOTE — Patient Instructions (Signed)

## 2022-01-21 NOTE — Telephone Encounter (Signed)
Following up with patient this morning. Patient called the after hours triage number for vaginal bleeding over the weekend.  ?"I started having bleeding Saturday and I got nervous so I called the after hours number. I did not have any bleeding after the procedure so I was surprised by it on Saturday. Bright red blood soaked my panties and got on my dress. I'm still having some bleeding but it is less than yesterday. I am now wearing a moderate pad. I would say my bleeding is light to moderate. I'm not changing my pad very often and it is not soaking it. I have some mild cramping. I had to take tramadol twice Saturday. I am now using tylenol and a heating pad for the discomfort.  If I had to rate my pain I'd say it's a 4/10. The nurse I spoke with this weekend told me it could be from the procedure on 3/21 and to monitor and call with heavy bleeding or fever." ? ?Patient denies fever or chills. She denies passing any clots.  Advised to continue to monitor bleeding and notify our office for any increased bleeding, pain, fever, chills, questions or concerns. Patient verbalized understanding and appreciative of call.  ?

## 2022-01-21 NOTE — Telephone Encounter (Signed)
[employer] 2021-05 - Specimen Collection Study to Evaluate Biomarkers in Subjects with Cancer  ? ?Called Ms Hirsch back as requested to follow up on study interest. She was unable to talk at this time and asked me to call back. ? ?Marjie Skiff Audryanna Zurita, RN, BSN, CHPN ?She  Her  Hers ?Clinical Research Nurse ?[place] ?Direct Dial [phone]  Pager [phone] ?01/21/2022 2:14 PM ?

## 2022-01-21 NOTE — Telephone Encounter (Signed)
Unsuccessful attempt to f/u with Pt today. Unable to lv msg for Pt to RC to Clinician as Mbox is not set-up. ? ?Dr. Theodis Shove ?

## 2022-01-21 NOTE — Progress Notes (Signed)
Gynecologic Oncology Multi-Disciplinary Disposition Conference Note ? ?Date of the Conference: 01/21/2022 ? ?Patient Name: Sherri Gill  ?Referring Provider: Dr. [name] ?Primary GYN Oncologist: [name] ? ?Stage/Disposition:  High grade serous carcinoma. Disposition is to neoadjuvant chemotherapy followed by repeat imaging and consideration for interval surgery.  Also referral to genetic counseling.  ? ?This Multidisciplinary conference took place involving physicians from [place], Medical Oncology, Radiation Oncology, Pathology, Radiology along with the Gynecologic Oncology Nurse Practitioner and Gynecologic Oncology Nurse Navigator.  Comprehensive assessment of the patient's malignancy, staging, need for surgery, chemotherapy, radiation therapy, and need for further testing were reviewed. Supportive measures, both inpatient and following discharge were also discussed. The recommended plan of care is documented. Greater than 35 minutes were spent correlating and coordinating this patient's care.  ?

## 2022-01-21 NOTE — Progress Notes (Signed)
Met w/ pt to introduce myself as her Arboriculturist.   Unfortunately there aren't any foundations offering copay assistance for her Dx and the type of ins she has.  I informed her of the [employer], went over what it covers and gave her an expense sheet.  She would like to apply so she will bring her proof of income on 01/25/22.  She has my card for any questions or concerns she may have in the future.  ?

## 2022-01-22 ENCOUNTER — Encounter: Payer: Self-pay | Admitting: Hematology and Oncology

## 2022-01-22 ENCOUNTER — Telehealth: Payer: Self-pay

## 2022-01-22 ENCOUNTER — Other Ambulatory Visit: Payer: Self-pay | Admitting: Oncology

## 2022-01-22 DIAGNOSIS — C55 Malignant neoplasm of uterus, part unspecified: Secondary | ICD-10-CM

## 2022-01-22 LAB — SURGICAL PATHOLOGY

## 2022-01-22 NOTE — Telephone Encounter (Signed)
[employer] 2021-05 - Specimen Collection Study to Evaluate Biomarkers in Subjects with Cancer  ? ?Called pt to follow up on interest in specimen collection study. No answer, no option to leave a VM. Please note that per protocol, pt would need to be consented prior to 01/25/22 (the start of chemo). Patient has my contact information from our face to face meeting last week. ? ?Marjie Skiff Dominga Mcduffie, RN, BSN, CHPN ?She  Her  Hers ?Clinical Research Nurse ?[place] ?Direct Dial [phone]  Pager [phone] ?01/22/2022 1:50 PM ?

## 2022-01-22 NOTE — Addendum Note (Signed)
Addended by: Elmo Putt R on: 01/22/2022 10:44 AM ? ? Modules accepted: Orders ? ?

## 2022-01-22 NOTE — Assessment & Plan Note (Addendum)
Her case was discussed at the GYN oncology tumor board ?Overall, the plan will be to proceed with chemotherapy as scheduled ?She will also receive intravenous iron infusion support for anemia ?We will reassess with CT imaging after 3 cycles of therapy ?I have reviewed side effects with the patient again and she is in agreement to proceed ?

## 2022-01-22 NOTE — Assessment & Plan Note (Signed)
The most likely cause of her anemia is due to chronic blood loss/malabsorption syndrome. ?We discussed some of the risks, benefits, and alternatives of intravenous iron infusions. The patient is symptomatic from anemia and the iron level is critically low. She tolerated oral iron supplement poorly and desires to achieved higher levels of iron faster for adequate hematopoesis. Some of the side-effects to be expected including risks of infusion reactions, phlebitis, headaches, nausea and fatigue.  The patient is willing to proceed. Patient education material was dispensed.  ?Goal is to keep ferritin level greater than 50 and resolution of anemia ? ?

## 2022-01-22 NOTE — Progress Notes (Signed)
[facility] ?OFFICE PROGRESS NOTE ? ?Patient Care Team: ?[name], MD as PCP - General (Internal Medicine) ?[name], RN as [facility] Management ? ?ASSESSMENT & PLAN:  ?Uterine cancer ([place]) ?Her case was discussed at the GYN oncology tumor board ?Overall, the plan will be to proceed with chemotherapy as scheduled ?She will also receive intravenous iron infusion support for anemia ?We will reassess with CT imaging after 3 cycles of therapy ?I have reviewed side effects with the patient again and she is in agreement to proceed ? ?Iron deficiency anemia ?The most likely cause of her anemia is due to chronic blood loss/malabsorption syndrome. ?We discussed some of the risks, benefits, and alternatives of intravenous iron infusions. The patient is symptomatic from anemia and the iron level is critically low. She tolerated oral iron supplement poorly and desires to achieved higher levels of iron faster for adequate hematopoesis. Some of the side-effects to be expected including risks of infusion reactions, phlebitis, headaches, nausea and fatigue.  The patient is willing to proceed. Patient education material was dispensed.  ?Goal is to keep ferritin level greater than 50 and resolution of anemia ? ? ?Dyslipidemia associated with type 2 diabetes mellitus ([place]) ?She is at risk of uncontrolled diabetes while on treatment ?We discussed importance of dietary modification while on chemotherapy ? ?Malignant ascites ?She has ascites on exam but not symptomatic ?I will reexamine her next week ?There is no role for paracentesis right now ? ?No orders of the defined types were placed in this encounter. ? ? ?All questions were answered. The patient knows to call the clinic with any problems, questions or concerns. ?The total time spent in the appointment was 30 minutes encounter with patients including review of chart and various tests results, discussions about plan of care and  coordination of care plan ?  ?[name], MD ?01/22/2022 7:34 AM ? ?INTERVAL HISTORY: ?Please see below for problem oriented charting. ?she returns for review of plan of care prior to chemotherapy and IV iron infusion ?Since last time I saw her, she had another vaginal bleeding episode over the weekend ?She tolerated port placement well ?She denies significant nausea or changes in bowel habits ?She is not uncomfortable with abdominal distention ?Her case was reviewed at the GYN oncology tumor board this morning ? ?REVIEW OF SYSTEMS:   ?Constitutional: Denies fevers, chills or abnormal weight loss ?Eyes: Denies blurriness of vision ?Ears, nose, mouth, throat, and face: Denies mucositis or sore throat ?Respiratory: Denies cough, dyspnea or wheezes ?Cardiovascular: Denies palpitation, chest discomfort or lower extremity swelling ?Gastrointestinal:  Denies nausea, heartburn or change in bowel habits ?Skin: Denies abnormal skin rashes ?Lymphatics: Denies new lymphadenopathy or easy bruising ?Neurological:Denies numbness, tingling or new weaknesses ?Behavioral/Psych: Mood is stable, no new changes  ?All other systems were reviewed with the patient and are negative. ? ?I have reviewed the past medical history, past surgical history, social history and family history with the patient and they are unchanged from previous note. ? ?ALLERGIES:  is allergic to codeine sulfate, penicillins, percocet [oxycodone-acetaminophen], codeine, and lisinopril. ? ?MEDICATIONS:  ?Current Outpatient Medications  ?Medication Sig Dispense Refill  ? acetaminophen (TYLENOL) 500 MG tablet Take 1,000 mg by mouth every 6 (six) hours as needed for mild pain.    ? albuterol (VENTOLIN HFA) 108 (90 Base) MCG/ACT inhaler Inhale 2 puffs into the lungs every 4 (four) hours as needed for wheezing or shortness of breath. 8 g 0  ? amLODipine (NORVASC)  10 MG tablet Take 1 tablet (10 mg total) by mouth daily. (Patient taking differently: Take 10 mg by mouth at  bedtime.) 90 tablet 2  ? atorvastatin (LIPITOR) 20 MG tablet Take 1 tablet (20 mg total) by mouth daily. (Patient taking differently: Take 20 mg by mouth at bedtime.) 90 tablet 3  ? Blood Glucose Monitoring Suppl (ONE TOUCH ULTRA MINI) w/Device KIT Please use as directed. 1 each 0  ? Cholecalciferol 1000 units capsule Take 1 capsule (1,000 Units total) by mouth daily. 90 capsule 1  ? dexamethasone (DECADRON) 4 MG tablet Take 2 tablets by mouth the night before and 2 tablets the morning of chemotherapy, every 3 weeks for 6 cycles 36 tablet 6  ? dorzolamide-timolol (COSOPT) 22.3-6.8 MG/ML ophthalmic solution Place 1 drop into both eyes 2 (two) times daily.    ? glucose blood (ONE TOUCH ULTRA TEST) test strip Use as instructed 100 each 1  ? ibuprofen (ADVIL) 200 MG tablet Take 200 mg by mouth every 6 (six) hours as needed.    ? Insulin Pen Needle 32G X 4 MM MISC 1 each by Does not apply route daily. 100 each 3  ? latanoprost (XALATAN) 0.005 % ophthalmic solution Place 1 drop into both eyes at bedtime.    ? lidocaine-prilocaine (EMLA) cream Apply to affected area once 30 g 3  ? liraglutide (VICTOZA) 18 MG/3ML SOPN PLEASE START TAKING VICTOZA 0.6MG DAILY WEEK 1, THEN TAKE 1.2MG DAILY WEEK 2, AND THEN TAKE 1.8MG DAILY THEREAFTER (Patient taking differently: Inject 1.2 mg into the skin daily.) 9 mL 3  ? losartan (COZAAR) 100 MG tablet Take 100 mg by mouth daily.    ? ondansetron (ZOFRAN) 8 MG tablet Take 1 tablet (8 mg total) by mouth 2 (two) times daily as needed. Start on the third day after chemotherapy. 30 tablet 1  ? ONETOUCH DELICA LANCETS 21H MISC Please use as directed. 100 each 0  ? polyethylene glycol powder (GLYCOLAX/MIRALAX) 17 GM/SCOOP powder Take 17 g by mouth daily. 238 g 0  ? prochlorperazine (COMPAZINE) 10 MG tablet Take 1 tablet (10 mg total) by mouth every 6 (six) hours as needed (Nausea or vomiting). 30 tablet 1  ? traMADol (ULTRAM) 50 MG tablet Take 1 tablet (50 mg total) by mouth every 6 (six) hours  as needed for moderate pain. 14 tablet 0  ? ?No current facility-administered medications for this visit.  ? ? 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  ?Uterine cancer ([place])  ?10/12/2021 Imaging  ? US pelvis ? ?1. Heterogeneous endometrial thickening with appearance of moderate complex fluid in the endometrial canal, possible hemorrhagic material. Endometrial thickness is considered abnormal for an asymptomatic post-menopausal female. Endometrial sampling should be considered to exclude carcinoma. ?2. Nonvisualized ovary ?3. Heterogeneous thick-walled urinary bladder, correlate for cystitis. ?  ?12/28/2021 Pathology Results  ? CYTOLOGY - NON PAP  ?CASE: [id number]  ?PATIENT: Sherri Gill  ?Non-Gynecological Cytology Report  ? ?Clinical History: Abnormal pelvic US, highly suspicious of malignant ascites  ?Specimen Submitted:  A. ASCITES, PARACENTESIS:  ? ? ?FINAL MICROSCOPIC DIAGNOSIS:  ?- Malignant cells consistent with adenocarcinoma  ?- See comment  ? ?SPECIMEN ADEQUACY:  ?Satisfactory for evaluation  ? ?DIAGNOSTIC COMMENTS:  ?Immunohistochemical stains show that the tumor cells are positive for CK7 and PAX8 while they are negative for CK20, CDX2 and ER, consistent with above interpretation.  Additionally, the tumor cells are diffusely positive for p53 (clonal overexpression), suggestive of  a high-grade serous carcinoma.  ?  ?12/28/2021 Imaging  ? US pelvis ?1. Persistent abnormal appearance of the endometrium, heterogeneously thickened and complex with possible intraluminal fluid. This is similar to that seen on December 2022 ultrasound. Endometrial thickness is considered abnormal for an asymptomatic post-menopausal female. Endometrial sampling should be considered to exclude carcinoma. ?2. Nonvisualization of the ovaries. ?  ?12/28/2021 Imaging  ? 1. Large volume ascites. ?2. Small umbilical hernia containing fat and ascites. ?3. Haziness of anterior  omentum most likely related to ascites, other etiologies such as metastatic disease can not be excluded. ?4. Diffuse colonic diverticulosis without evidence for diverticulitis. ?  ?12/29/2021 Procedure  ? Su

## 2022-01-22 NOTE — Progress Notes (Signed)
Order for extra purple top to be drawn with 01/31/22 labs for MSI testing. ?

## 2022-01-22 NOTE — Assessment & Plan Note (Signed)
She is at risk of uncontrolled diabetes while on treatment ?We discussed importance of dietary modification while on chemotherapy ?

## 2022-01-22 NOTE — Assessment & Plan Note (Signed)
She has ascites on exam but not symptomatic ?I will reexamine her next week ?There is no role for paracentesis right now ?

## 2022-01-24 DIAGNOSIS — R69 Illness, unspecified: Secondary | ICD-10-CM | POA: Diagnosis not present

## 2022-01-24 DIAGNOSIS — Z9981 Dependence on supplemental oxygen: Secondary | ICD-10-CM | POA: Diagnosis not present

## 2022-01-24 DIAGNOSIS — E871 Hypo-osmolality and hyponatremia: Secondary | ICD-10-CM | POA: Diagnosis not present

## 2022-01-24 DIAGNOSIS — I10 Essential (primary) hypertension: Secondary | ICD-10-CM | POA: Diagnosis not present

## 2022-01-24 DIAGNOSIS — Z87891 Personal history of nicotine dependence: Secondary | ICD-10-CM | POA: Diagnosis not present

## 2022-01-24 DIAGNOSIS — K5730 Diverticulosis of large intestine without perforation or abscess without bleeding: Secondary | ICD-10-CM | POA: Diagnosis not present

## 2022-01-24 DIAGNOSIS — E1142 Type 2 diabetes mellitus with diabetic polyneuropathy: Secondary | ICD-10-CM | POA: Diagnosis not present

## 2022-01-24 DIAGNOSIS — K429 Umbilical hernia without obstruction or gangrene: Secondary | ICD-10-CM | POA: Diagnosis not present

## 2022-01-24 DIAGNOSIS — G4700 Insomnia, unspecified: Secondary | ICD-10-CM | POA: Diagnosis not present

## 2022-01-24 DIAGNOSIS — R188 Other ascites: Secondary | ICD-10-CM | POA: Diagnosis not present

## 2022-01-24 DIAGNOSIS — E1165 Type 2 diabetes mellitus with hyperglycemia: Secondary | ICD-10-CM | POA: Diagnosis not present

## 2022-01-24 DIAGNOSIS — E669 Obesity, unspecified: Secondary | ICD-10-CM | POA: Diagnosis not present

## 2022-01-24 DIAGNOSIS — K219 Gastro-esophageal reflux disease without esophagitis: Secondary | ICD-10-CM | POA: Diagnosis not present

## 2022-01-24 DIAGNOSIS — Z7952 Long term (current) use of systemic steroids: Secondary | ICD-10-CM | POA: Diagnosis not present

## 2022-01-24 DIAGNOSIS — E785 Hyperlipidemia, unspecified: Secondary | ICD-10-CM | POA: Diagnosis not present

## 2022-01-24 DIAGNOSIS — Z86010 Personal history of colonic polyps: Secondary | ICD-10-CM | POA: Diagnosis not present

## 2022-01-24 DIAGNOSIS — D72829 Elevated white blood cell count, unspecified: Secondary | ICD-10-CM | POA: Diagnosis not present

## 2022-01-24 DIAGNOSIS — Z6833 Body mass index (BMI) 33.0-33.9, adult: Secondary | ICD-10-CM | POA: Diagnosis not present

## 2022-01-24 DIAGNOSIS — E236 Other disorders of pituitary gland: Secondary | ICD-10-CM | POA: Diagnosis not present

## 2022-01-24 DIAGNOSIS — Z9851 Tubal ligation status: Secondary | ICD-10-CM | POA: Diagnosis not present

## 2022-01-24 DIAGNOSIS — N179 Acute kidney failure, unspecified: Secondary | ICD-10-CM | POA: Diagnosis not present

## 2022-01-24 DIAGNOSIS — Z79899 Other long term (current) drug therapy: Secondary | ICD-10-CM | POA: Diagnosis not present

## 2022-01-24 DIAGNOSIS — D509 Iron deficiency anemia, unspecified: Secondary | ICD-10-CM | POA: Diagnosis not present

## 2022-01-25 ENCOUNTER — Other Ambulatory Visit: Payer: Self-pay

## 2022-01-25 ENCOUNTER — Inpatient Hospital Stay: Payer: Medicare HMO | Admitting: Gynecologic Oncology

## 2022-01-25 ENCOUNTER — Inpatient Hospital Stay (HOSPITAL_BASED_OUTPATIENT_CLINIC_OR_DEPARTMENT_OTHER): Payer: Medicare HMO

## 2022-01-25 VITALS — BP 146/63 | HR 108 | Temp 98.0°F | Resp 18 | Wt 181.8 lb

## 2022-01-25 DIAGNOSIS — R69 Illness, unspecified: Secondary | ICD-10-CM | POA: Diagnosis not present

## 2022-01-25 DIAGNOSIS — E669 Obesity, unspecified: Secondary | ICD-10-CM | POA: Diagnosis not present

## 2022-01-25 DIAGNOSIS — Z6831 Body mass index (BMI) 31.0-31.9, adult: Secondary | ICD-10-CM | POA: Diagnosis not present

## 2022-01-25 DIAGNOSIS — R9389 Abnormal findings on diagnostic imaging of other specified body structures: Secondary | ICD-10-CM | POA: Diagnosis not present

## 2022-01-25 DIAGNOSIS — R102 Pelvic and perineal pain: Secondary | ICD-10-CM | POA: Diagnosis not present

## 2022-01-25 DIAGNOSIS — C55 Malignant neoplasm of uterus, part unspecified: Secondary | ICD-10-CM

## 2022-01-25 DIAGNOSIS — E785 Hyperlipidemia, unspecified: Secondary | ICD-10-CM | POA: Diagnosis not present

## 2022-01-25 DIAGNOSIS — R180 Malignant ascites: Secondary | ICD-10-CM | POA: Diagnosis not present

## 2022-01-25 DIAGNOSIS — R630 Anorexia: Secondary | ICD-10-CM

## 2022-01-25 DIAGNOSIS — C801 Malignant (primary) neoplasm, unspecified: Secondary | ICD-10-CM | POA: Diagnosis not present

## 2022-01-25 DIAGNOSIS — K219 Gastro-esophageal reflux disease without esophagitis: Secondary | ICD-10-CM | POA: Diagnosis not present

## 2022-01-25 DIAGNOSIS — I10 Essential (primary) hypertension: Secondary | ICD-10-CM | POA: Diagnosis not present

## 2022-01-25 MED ORDER — SODIUM CHLORIDE 0.9 % IV SOLN
526.8000 mg | Freq: Once | INTRAVENOUS | Status: AC
  Administered 2022-01-25: 530 mg via INTRAVENOUS
  Filled 2022-01-25: qty 53

## 2022-01-25 MED ORDER — SODIUM CHLORIDE 0.9 % IV SOLN
150.0000 mg | Freq: Once | INTRAVENOUS | Status: AC
  Administered 2022-01-25: 150 mg via INTRAVENOUS
  Filled 2022-01-25: qty 150

## 2022-01-25 MED ORDER — PALONOSETRON HCL INJECTION 0.25 MG/5ML
0.2500 mg | Freq: Once | INTRAVENOUS | Status: AC
  Administered 2022-01-25: 0.25 mg via INTRAVENOUS
  Filled 2022-01-25: qty 5

## 2022-01-25 MED ORDER — SODIUM CHLORIDE 0.9 % IV SOLN
175.0000 mg/m2 | Freq: Once | INTRAVENOUS | Status: AC
  Administered 2022-01-25: 336 mg via INTRAVENOUS
  Filled 2022-01-25: qty 56

## 2022-01-25 MED ORDER — FAMOTIDINE IN NACL 20-0.9 MG/50ML-% IV SOLN
20.0000 mg | Freq: Once | INTRAVENOUS | Status: AC
  Administered 2022-01-25: 20 mg via INTRAVENOUS
  Filled 2022-01-25: qty 50

## 2022-01-25 MED ORDER — HEPARIN SOD (PORK) LOCK FLUSH 100 UNIT/ML IV SOLN
500.0000 [IU] | Freq: Once | INTRAVENOUS | Status: AC | PRN
  Administered 2022-01-25: 500 [IU]

## 2022-01-25 MED ORDER — SODIUM CHLORIDE 0.9% FLUSH
10.0000 mL | INTRAVENOUS | Status: DC | PRN
  Administered 2022-01-25: 10 mL

## 2022-01-25 MED ORDER — SODIUM CHLORIDE 0.9 % IV SOLN
10.0000 mg | Freq: Once | INTRAVENOUS | Status: AC
  Administered 2022-01-25: 10 mg via INTRAVENOUS
  Filled 2022-01-25: qty 10

## 2022-01-25 MED ORDER — DIPHENHYDRAMINE HCL 50 MG/ML IJ SOLN
25.0000 mg | Freq: Once | INTRAMUSCULAR | Status: AC
  Administered 2022-01-25: 25 mg via INTRAVENOUS
  Filled 2022-01-25: qty 1

## 2022-01-25 MED ORDER — SODIUM CHLORIDE 0.9 % IV SOLN: Freq: Once | INTRAVENOUS | Status: AC

## 2022-01-25 NOTE — Patient Instructions (Signed)
[facility]  Discharge Instructions: ?Thank you for choosing [facility] to provide your oncology and hematology care.  ? ?If you have a lab appointment with the [facility], please go directly to the [facility] and check in at the registration area. ?  ?Wear comfortable clothing and clothing appropriate for easy access to any Portacath or PICC line.  ? ?We strive to give you quality time with your provider. You may need to reschedule your appointment if you arrive late (15 or more minutes).  Arriving late affects you and other patients whose appointments are after yours.  Also, if you miss three or more appointments without notifying the office, you may be dismissed from the clinic at the provider?s discretion.    ?  ?For prescription refill requests, have your pharmacy contact our office and allow 72 hours for refills to be completed.   ? ?Today you received the following chemotherapy and/or immunotherapy agents: Paclitaxel & Carboplatin    ?  ?To help prevent nausea and vomiting after your treatment, we encourage you to take your nausea medication as directed. ? ?BELOW ARE SYMPTOMS THAT SHOULD BE REPORTED IMMEDIATELY: ?*FEVER GREATER THAN 100.4 F (38 ?C) OR HIGHER ?*CHILLS OR SWEATING ?*NAUSEA AND VOMITING THAT IS NOT CONTROLLED WITH YOUR NAUSEA MEDICATION ?*UNUSUAL SHORTNESS OF BREATH ?*UNUSUAL BRUISING OR BLEEDING ?*URINARY PROBLEMS (pain or burning when urinating, or frequent urination) ?*BOWEL PROBLEMS (unusual diarrhea, constipation, pain near the anus) ?TENDERNESS IN MOUTH AND THROAT WITH OR WITHOUT PRESENCE OF ULCERS (sore throat, sores in mouth, or a toothache) ?UNUSUAL RASH, SWELLING OR PAIN  ?UNUSUAL VAGINAL DISCHARGE OR ITCHING  ? ?Items with * indicate a potential emergency and should be followed up as soon as possible or go to the Emergency Department if any problems should occur. ? ?Please show the CHEMOTHERAPY ALERT CARD or IMMUNOTHERAPY ALERT CARD  at check-in to the Emergency Department and triage nurse. ? ?Should you have questions after your visit or need to cancel or reschedule your appointment, please contact [facility]  Dept: [phone]  and follow the prompts.  Office hours are 8:00 a.m. to 4:30 p.m. Monday - Friday. Please note that voicemails left after 4:00 p.m. may not be returned until the following business day.  We are closed weekends and major holidays. You have access to a nurse at all times for urgent questions. Please call the main number to the clinic Dept: [phone] and follow the prompts. ? ? ?For any non-urgent questions, you may also contact your provider using MyChart. We now offer e-Visits for anyone 34 and older to request care online for non-urgent symptoms. For details visit [email or web address]. ?  ?Also download the MyChart app! Go to the app store, search "MyChart", open the app, select [facility], and log in with your MyChart username and password. ? ?Due to Covid, a mask is required upon entering the hospital/clinic. If you do not have a mask, one will be given to you upon arrival. For doctor visits, patients may have 1 support person aged 33 or older with them. For treatment visits, patients cannot have anyone with them due to current Covid guidelines and our immunocompromised population.  ? ?Paclitaxel injection ?What is this medication? ?PACLITAXEL (PAK li TAX el) is a chemotherapy drug. It targets fast dividing cells, like cancer cells, and causes these cells to die. This medicine is used to treat ovarian cancer, breast cancer, lung cancer, Kaposi's sarcoma, and other cancers. ?This  medicine may be used for other purposes; ask your health care provider or pharmacist if you have questions. ?COMMON BRAND NAME(S): Onxol, Taxol ?What should I tell my care team before I take this medication? ?They need to know if you have any of these conditions: ?history of irregular heartbeat ?liver  disease ?low blood counts, like low white cell, platelet, or red cell counts ?lung or breathing disease, like asthma ?tingling of the fingers or toes, or other nerve disorder ?an unusual or allergic reaction to paclitaxel, alcohol, polyoxyethylated castor oil, other chemotherapy, other medicines, foods, dyes, or preservatives ?pregnant or trying to get pregnant ?breast-feeding ?How should I use this medication? ?This drug is given as an infusion into a vein. It is administered in a hospital or clinic by a specially trained health care professional. ?Talk to your pediatrician regarding the use of this medicine in children. Special care may be needed. ?Overdosage: If you think you have taken too much of this medicine contact a poison control center or emergency room at once. ?NOTE: This medicine is only for you. Do not share this medicine with others. ?What if I miss a dose? ?It is important not to miss your dose. Call your doctor or health care professional if you are unable to keep an appointment. ?What may interact with this medication? ?Do not take this medicine with any of the following medications: ?live virus vaccines ?This medicine may also interact with the following medications: ?antiviral medicines for hepatitis, HIV or AIDS ?certain antibiotics like erythromycin and clarithromycin ?certain medicines for fungal infections like ketoconazole and itraconazole ?certain medicines for seizures like carbamazepine, phenobarbital, phenytoin ?gemfibrozil ?nefazodone ?rifampin ?St. John's wort ?This list may not describe all possible interactions. Give your health care provider a list of all the medicines, herbs, non-prescription drugs, or dietary supplements you use. Also tell them if you smoke, drink alcohol, or use illegal drugs. Some items may interact with your medicine. ?What should I watch for while using this medication? ?Your condition will be monitored carefully while you are receiving this medicine. You  will need important blood work done while you are taking this medicine. ?This medicine can cause serious allergic reactions. To reduce your risk you will need to take other medicine(s) before treatment with this medicine. If you experience allergic reactions like skin rash, itching or hives, swelling of the face, lips, or tongue, tell your doctor or health care professional right away. ?In some cases, you may be given additional medicines to help with side effects. Follow all directions for their use. ?This drug may make you feel generally unwell. This is not uncommon, as chemotherapy can affect healthy cells as well as cancer cells. Report any side effects. Continue your course of treatment even though you feel ill unless your doctor tells you to stop. ?Call your doctor or health care professional for advice if you get a fever, chills or sore throat, or other symptoms of a cold or flu. Do not treat yourself. This drug decreases your body's ability to fight infections. Try to avoid being around people who are sick. ?This medicine may increase your risk to bruise or bleed. Call your doctor or health care professional if you notice any unusual bleeding. ?Be careful brushing and flossing your teeth or using a toothpick because you may get an infection or bleed more easily. If you have any dental work done, tell your dentist you are receiving this medicine. ?Avoid taking products that contain aspirin, acetaminophen, ibuprofen, naproxen, or ketoprofen unless instructed  by your doctor. These medicines may hide a fever. ?Do not become pregnant while taking this medicine. Women should inform their doctor if they wish to become pregnant or think they might be pregnant. There is a potential for serious side effects to an unborn child. Talk to your health care professional or pharmacist for more information. Do not breast-feed an infant while taking this medicine. ?Men are advised not to father a child while receiving this  medicine. ?This product may contain alcohol. Ask your pharmacist or healthcare provider if this medicine contains alcohol. Be sure to tell all healthcare providers you are taking this medicine. Certain medicine

## 2022-01-28 ENCOUNTER — Other Ambulatory Visit: Payer: Self-pay | Admitting: Hematology and Oncology

## 2022-01-28 ENCOUNTER — Telehealth: Payer: Self-pay

## 2022-01-28 ENCOUNTER — Other Ambulatory Visit (HOSPITAL_COMMUNITY): Payer: Self-pay

## 2022-01-28 ENCOUNTER — Other Ambulatory Visit: Payer: Self-pay

## 2022-01-28 ENCOUNTER — Ambulatory Visit: Payer: Medicare HMO | Admitting: Obstetrics and Gynecology

## 2022-01-28 MED ORDER — TRAMADOL HCL 50 MG PO TABS
100.0000 mg | ORAL_TABLET | Freq: Four times a day (QID) | ORAL | 0 refills | Status: DC | PRN
  Filled 2022-01-28: qty 60, 8d supply, fill #0

## 2022-01-28 NOTE — Telephone Encounter (Signed)
Called and told Dr. Alvy Bimler sent 60 tabs of Tramadol Rx to [facility] to outpatient pharmacy. She verbalized understanding. ?

## 2022-01-28 NOTE — Telephone Encounter (Signed)
-----   Message from Mullet Modena, RN sent at 01/25/2022  3:48 PM EDT ----- ?Regarding: Dr. Alvy Bimler 1st time Taxol/Carbo f/u [facility] well ?First time taxol/carbo tolerated well w/out incident on 01/25/22 ? ?

## 2022-01-28 NOTE — Telephone Encounter (Signed)
?  Chronic Care Management  ? ?Note ? ?01/28/2022 ?Name: Sherri Gill MRN: [id number] DOB: 1951/04/16 ? ?Incoming call from patient requesting a refill on her Tramadol as she is receiving chemo for Uterine cancer and needs pain medication.  Patient only has a few pills left and would like the prescription sent to [facility] as she is getting her chemo at [facility].  Message sent to [facility] team and Dr. [name] for review.  Plan to call patient when response received. ? ?[name], RN, BSN, CCM ?Care Management Coordinator ?[facility] ?Phone: [phone]/WGN: [phone]   ?

## 2022-01-28 NOTE — Telephone Encounter (Signed)
-----   Message from [name], RN sent at 01/28/2022  4:07 PM EDT ----- ?Sorry, I meant to add you to this message. Deb ?----- Message ----- ?From: [name], RN ?Sent: 01/28/2022  10:49 AM EDT ?To: [name], MD, [facility] ? ?Good morning, ?I received a call from Sherri Gill who started chemo for uterine cancer. She said Dr. [name] gave her a prescription for Tramadol and it was filled at [facility].  She only has 2-3 pills left and needs a refill.  She wants to have that prescription sent to [facility] as she is [facility] her [facility].  Can you please let me know as she wants me to call her. ?Thanks, ?Deb ? ?[name], RN, BSN, CCM ?Care Management Coordinator ?[facility] ?Phone: [phone]/TMH: [phone]  ? ? ?

## 2022-01-28 NOTE — Telephone Encounter (Signed)
Sherri Gill states that she is drinking fluids and urinating well. ?She felt nauseous this am.  She ate some collard greens last night and felt gassy from them this am.  She took a Zofran tablet with good effect. Suggested eating soft foods like scrambled eggs, pasta,and soups. ?She had a BM Saturday after taking MOM.  Suggested she take the miralax. She may start this medication but she is content with taking MOM. ?She is experiencing some achy ness in her knees and legs today.  Suggested she use a heating pad and use tylenol prn. ?She has experienced increased lower abdominal pain since Friday's treatment.   ?She is using the tramadol 50 mg 1-2 times a day. She only has 2 tabs left from PCP. ?She has a call into Dr. [name] for refill but concerned that maybe at this time Dr. Alvy Bimler should be prescribing it.  Told her I would let Dr. Alvy Bimler know that she needs a refill.  She will have her nurse call her with her response. Pt verbalized understanding. ? ?

## 2022-01-28 NOTE — Telephone Encounter (Signed)
60 tabs of Tramadol sent today to [facility] by oncologist. ?

## 2022-01-29 ENCOUNTER — Other Ambulatory Visit: Payer: Self-pay | Admitting: Internal Medicine

## 2022-01-29 ENCOUNTER — Encounter: Payer: Self-pay | Admitting: *Deleted

## 2022-01-29 ENCOUNTER — Telehealth: Payer: Self-pay | Admitting: *Deleted

## 2022-01-29 NOTE — Telephone Encounter (Signed)
Spoke with pt who stated she was going to drop off FMLA papers for staff. Informed pt to drop it off at the front and and fill out the paperwork given to her and we will work on it. It takes 7-10 business days to process. Also created a work excuse for her daughter per pt's request.   ?

## 2022-01-31 ENCOUNTER — Inpatient Hospital Stay: Payer: Medicare HMO | Admitting: Hematology and Oncology

## 2022-01-31 ENCOUNTER — Inpatient Hospital Stay: Payer: Medicare HMO

## 2022-01-31 ENCOUNTER — Inpatient Hospital Stay: Payer: Medicare HMO | Attending: Gynecologic Oncology

## 2022-01-31 ENCOUNTER — Encounter: Payer: Self-pay | Admitting: Hematology and Oncology

## 2022-01-31 ENCOUNTER — Other Ambulatory Visit: Payer: Self-pay

## 2022-01-31 VITALS — BP 139/65 | HR 76 | Temp 98.0°F | Resp 18

## 2022-01-31 DIAGNOSIS — R180 Malignant ascites: Secondary | ICD-10-CM

## 2022-01-31 DIAGNOSIS — C55 Malignant neoplasm of uterus, part unspecified: Secondary | ICD-10-CM | POA: Insufficient documentation

## 2022-01-31 DIAGNOSIS — D508 Other iron deficiency anemias: Secondary | ICD-10-CM | POA: Diagnosis not present

## 2022-01-31 DIAGNOSIS — E876 Hypokalemia: Secondary | ICD-10-CM | POA: Diagnosis not present

## 2022-01-31 DIAGNOSIS — Z5111 Encounter for antineoplastic chemotherapy: Secondary | ICD-10-CM | POA: Diagnosis not present

## 2022-01-31 DIAGNOSIS — K909 Intestinal malabsorption, unspecified: Secondary | ICD-10-CM | POA: Insufficient documentation

## 2022-01-31 DIAGNOSIS — R0600 Dyspnea, unspecified: Secondary | ICD-10-CM | POA: Diagnosis not present

## 2022-01-31 DIAGNOSIS — D509 Iron deficiency anemia, unspecified: Secondary | ICD-10-CM

## 2022-01-31 DIAGNOSIS — Z79899 Other long term (current) drug therapy: Secondary | ICD-10-CM | POA: Diagnosis not present

## 2022-01-31 LAB — CMP (CANCER CENTER ONLY)
ALT: 10 U/L (ref 0–44)
AST: 13 U/L — ABNORMAL LOW (ref 15–41)
Albumin: 3.9 g/dL (ref 3.5–5.0)
Alkaline Phosphatase: 96 U/L (ref 38–126)
Anion gap: 8 (ref 5–15)
BUN: 16 mg/dL (ref 8–23)
CO2: 32 mmol/L (ref 22–32)
Calcium: 9 mg/dL (ref 8.9–10.3)
Chloride: 95 mmol/L — ABNORMAL LOW (ref 98–111)
Creatinine: 1.03 mg/dL — ABNORMAL HIGH (ref 0.44–1.00)
GFR, Estimated: 59 mL/min — ABNORMAL LOW (ref >60)
Glucose, Bld: 122 mg/dL — ABNORMAL HIGH (ref 70–99)
Potassium: 3 mmol/L — ABNORMAL LOW (ref 3.5–5.1)
Sodium: 135 mmol/L (ref 135–145)
Total Bilirubin: 0.6 mg/dL (ref 0.3–1.2)
Total Protein: 7.2 g/dL (ref 6.5–8.1)

## 2022-01-31 LAB — CBC WITH DIFFERENTIAL (CANCER CENTER ONLY)
Abs Immature Granulocytes: 0.03 10*3/uL (ref 0.00–0.07)
Basophils Absolute: 0 10*3/uL (ref 0.0–0.1)
Basophils Relative: 0 %
Eosinophils Absolute: 0.1 10*3/uL (ref 0.0–0.5)
Eosinophils Relative: 3 %
HCT: 28.3 % — ABNORMAL LOW (ref 36.0–46.0)
Hemoglobin: 9 g/dL — ABNORMAL LOW (ref 12.0–15.0)
Immature Granulocytes: 1 %
Lymphocytes Relative: 23 %
Lymphs Abs: 1 10*3/uL (ref 0.7–4.0)
MCH: 23.4 pg — ABNORMAL LOW (ref 26.0–34.0)
MCHC: 31.8 g/dL (ref 30.0–36.0)
MCV: 73.7 fL — ABNORMAL LOW (ref 80.0–100.0)
Monocytes Absolute: 0.1 10*3/uL (ref 0.1–1.0)
Monocytes Relative: 3 %
Neutro Abs: 3.1 10*3/uL (ref 1.7–7.7)
Neutrophils Relative %: 70 %
Platelet Count: 432 10*3/uL — ABNORMAL HIGH (ref 150–400)
RBC: 3.84 MIL/uL — ABNORMAL LOW (ref 3.87–5.11)
RDW: 17.4 % — ABNORMAL HIGH (ref 11.5–15.5)
WBC Count: 4.4 10*3/uL (ref 4.0–10.5)
nRBC: 0 % (ref 0.0–0.2)

## 2022-01-31 MED ORDER — SODIUM CHLORIDE 0.9 % IV SOLN: Freq: Once | INTRAVENOUS | Status: AC

## 2022-01-31 MED ORDER — SODIUM CHLORIDE 0.9 % IV SOLN
200.0000 mg | Freq: Once | INTRAVENOUS | Status: AC
  Administered 2022-01-31: 200 mg via INTRAVENOUS
  Filled 2022-01-31: qty 200

## 2022-01-31 MED ORDER — SODIUM CHLORIDE 0.9% FLUSH
10.0000 mL | Freq: Once | INTRAVENOUS | Status: AC
  Administered 2022-01-31: 10 mL

## 2022-01-31 NOTE — Progress Notes (Signed)
Patient tolerated IV iron infusion well. Monitored for 30 minute post observation period without incident. VSS, ambulatory to lobby.  

## 2022-01-31 NOTE — Assessment & Plan Note (Signed)
So far, she tolerated treatment very well ?Since she has been aggressive with diet dietary modification, she has lost some weight but she felt stronger ?Her nausea and constipation has resolved ?She has persistent microcytic anemia and she will be receiving intravenous iron infusion today and next week ?

## 2022-01-31 NOTE — Assessment & Plan Note (Signed)
Her abdomen looks flat ?I suspect her weight loss is due to resolution of ascites ?Observe closely for now ?

## 2022-01-31 NOTE — Progress Notes (Signed)
[place] ?OFFICE PROGRESS NOTE ? ?Patient Care Team: ?Sherri Pou, MD as PCP - General (Internal Medicine) ?Sherri Killian, RN as [facility] Management ? ?ASSESSMENT & PLAN:  ?Uterine cancer ([place]) ?So far, she tolerated treatment very well ?Since she has been aggressive with diet dietary modification, she has lost some weight but she felt stronger ?Her nausea and constipation has resolved ?She has persistent microcytic anemia and she will be receiving intravenous iron infusion today and next week ? ?Iron deficiency anemia ?The most likely cause of her anemia is due to chronic blood loss/malabsorption syndrome. ?We discussed some of the risks, benefits, and alternatives of intravenous iron infusions. The patient is symptomatic from anemia and the iron level is critically low. She tolerated oral iron supplement poorly and desires to achieved higher levels of iron faster for adequate hematopoesis. Some of the side-effects to be expected including risks of infusion reactions, phlebitis, headaches, nausea and fatigue.  The patient is willing to proceed. Patient education material was dispensed.  ?Goal is to keep ferritin level greater than 50 and resolution of anemia ? ? ?Malignant ascites ?Her abdomen looks flat ?I suspect her weight loss is due to resolution of ascites ?Observe closely for now ? ?No orders of the defined types were placed in this encounter. ? ? ?All questions were answered. The patient knows to call the clinic with any problems, questions or concerns. ?The total time spent in the appointment was 20 minutes encounter with patients including review of chart and various tests results, discussions about plan of care and coordination of care plan ?  ?Sherri Lark, MD ?01/31/2022 5:40 PM ? ?INTERVAL HISTORY: ?Please see below for problem oriented charting. ?she returns for treatment follow-up ?She tolerated last cycle of treatment well ?She have lost a lot of  weight ?Initially, she had nausea and constipation but all the symptoms has resolved ?She denies abdominal distention ?No recent bleeding ? ?REVIEW OF SYSTEMS:   ?Constitutional: Denies fevers, chills or abnormal weight loss ?Eyes: Denies blurriness of vision ?Ears, nose, mouth, throat, and face: Denies mucositis or sore throat ?Respiratory: Denies cough, dyspnea or wheezes ?Cardiovascular: Denies palpitation, chest discomfort or lower extremity swelling ?Gastrointestinal:  Denies nausea, heartburn or change in bowel habits ?Skin: Denies abnormal skin rashes ?Lymphatics: Denies new lymphadenopathy or easy bruising ?Neurological:Denies numbness, tingling or new weaknesses ?Behavioral/Psych: Mood is stable, no new changes  ?All other systems were reviewed with the patient and are negative. ? ?I have reviewed the past medical history, past surgical history, social history and family history with the patient and they are unchanged from previous note. ? ?ALLERGIES:  is allergic to codeine sulfate, penicillins, percocet [oxycodone-acetaminophen], codeine, and lisinopril. ? ?MEDICATIONS:  ?Current Outpatient Medications  ?Medication Sig Dispense Refill  ? acetaminophen (TYLENOL) 500 MG tablet Take 1,000 mg by mouth every 6 (six) hours as needed for mild pain.    ? albuterol (VENTOLIN HFA) 108 (90 Base) MCG/ACT inhaler Inhale 2 puffs into the lungs every 4 (four) hours as needed for wheezing or shortness of breath. 8 g 0  ? amLODipine (NORVASC) 10 MG tablet Take 1 tablet (10 mg total) by mouth daily. (Patient taking differently: Take 10 mg by mouth at bedtime.) 90 tablet 2  ? atorvastatin (LIPITOR) 20 MG tablet Take 1 tablet (20 mg total) by mouth daily. (Patient taking differently: Take 20 mg by mouth at bedtime.) 90 tablet 3  ? Blood Glucose Monitoring Suppl (ONE TOUCH ULTRA MINI) w/Device KIT  Please use as directed. 1 each 0  ? Cholecalciferol 1000 units capsule Take 1 capsule (1,000 Units total) by mouth daily. 90  capsule 1  ? dexamethasone (DECADRON) 4 MG tablet Take 2 tablets by mouth the night before and 2 tablets the morning of chemotherapy, every 3 weeks for 6 cycles 36 tablet 6  ? dorzolamide-timolol (COSOPT) 22.3-6.8 MG/ML ophthalmic solution Place 1 drop into both eyes 2 (two) times daily.    ? glucose blood (ONE TOUCH ULTRA TEST) test strip Use as instructed 100 each 1  ? ibuprofen (ADVIL) 200 MG tablet Take 200 mg by mouth every 6 (six) hours as needed.    ? Insulin Pen Needle 32G X 4 MM MISC 1 each by Does not apply route daily. 100 each 3  ? latanoprost (XALATAN) 0.005 % ophthalmic solution Place 1 drop into both eyes at bedtime.    ? lidocaine-prilocaine (EMLA) cream Apply to affected area once 30 g 3  ? liraglutide (VICTOZA) 18 MG/3ML SOPN PLEASE START TAKING VICTOZA 0.6MG DAILY WEEK 1, THEN TAKE 1.2MG DAILY WEEK 2, AND THEN TAKE 1.8MG DAILY THEREAFTER (Patient taking differently: Inject 1.2 mg into the skin daily.) 9 mL 3  ? losartan (COZAAR) 100 MG tablet Take 100 mg by mouth daily.    ? ondansetron (ZOFRAN) 8 MG tablet Take 1 tablet (8 mg total) by mouth 2 (two) times daily as needed. Start on the third day after chemotherapy. 30 tablet 1  ? ONETOUCH DELICA LANCETS 46N MISC Please use as directed. 100 each 0  ? polyethylene glycol powder (GLYCOLAX/MIRALAX) 17 GM/SCOOP powder Take 17 g by mouth daily. 238 g 0  ? prochlorperazine (COMPAZINE) 10 MG tablet Take 1 tablet (10 mg total) by mouth every 6 (six) hours as needed (Nausea or vomiting). 30 tablet 1  ? traMADol (ULTRAM) 50 MG tablet Take 2 tablets (100 mg total) by mouth every 6 (six) hours as needed for moderate pain. 60 tablet 0  ? ?No current facility-administered medications for this visit.  ? ? ?SUMMARY OF ONCOLOGIC HISTORY: ?Oncology History Overview Note  ?High grade serous, Her2/neu neg, MMR normal ?  ?Uterine cancer ([place])  ?10/12/2021 Imaging  ? US pelvis ? ?1. Heterogeneous endometrial thickening with appearance of moderate complex fluid in the  endometrial canal, possible hemorrhagic material. Endometrial thickness is considered abnormal for an asymptomatic post-menopausal female. Endometrial sampling should be considered to exclude carcinoma. ?2. Nonvisualized ovary ?3. Heterogeneous thick-walled urinary bladder, correlate for cystitis. ?  ?12/28/2021 Pathology Results  ? CYTOLOGY - NON PAP  ?CASE: [id number]  ?PATIENT: Sherri Gill  ?Non-Gynecological Cytology Report  ? ?Clinical History: Abnormal pelvic US, highly suspicious of malignant ascites  ?Specimen Submitted:  A. ASCITES, PARACENTESIS:  ? ? ?FINAL MICROSCOPIC DIAGNOSIS:  ?- Malignant cells consistent with adenocarcinoma  ?- See comment  ? ?SPECIMEN ADEQUACY:  ?Satisfactory for evaluation  ? ?DIAGNOSTIC COMMENTS:  ?Immunohistochemical stains show that the tumor cells are positive for CK7 and PAX8 while they are negative for CK20, CDX2 and ER, consistent with above interpretation.  Additionally, the tumor cells are diffusely positive for p53 (clonal overexpression), suggestive of a high-grade serous carcinoma.  ?  ?12/28/2021 Imaging  ? US pelvis ?1. Persistent abnormal appearance of the endometrium, heterogeneously thickened and complex with possible intraluminal fluid. This is similar to that seen on December 2022 ultrasound. Endometrial thickness is considered abnormal for an asymptomatic post-menopausal female. Endometrial sampling should be considered to exclude carcinoma. ?2. Nonvisualization of the ovaries. ?  ?12/28/2021  Imaging  ? 1. Large volume ascites. ?2. Small umbilical hernia containing fat and ascites. ?3. Haziness of anterior omentum most likely related to ascites, other etiologies such as metastatic disease can not be excluded. ?4. Diffuse colonic diverticulosis without evidence for diverticulitis. ?  ?12/29/2021 Procedure  ? Successful ultrasound-guided paracentesis yielding 2.8 liters of peritoneal fluid. ?  ?01/14/2022 Initial Diagnosis  ? Uterine cancer [facility]) ?  ?01/14/2022 Cancer  Staging  ? Staging form: Corpus Uteri - Carcinoma and Carcinosarcoma, AJCC 8th Edition ?- Clinical: Stage III (cT3, cN0, cM0) - Signed by Sherri Lark, MD on 01/14/2022 ?Stage prefix: Initial diagnosis ? ?  ?3/27/2

## 2022-01-31 NOTE — Assessment & Plan Note (Signed)
The most likely cause of her anemia is due to chronic blood loss/malabsorption syndrome. ?We discussed some of the risks, benefits, and alternatives of intravenous iron infusions. The patient is symptomatic from anemia and the iron level is critically low. She tolerated oral iron supplement poorly and desires to achieved higher levels of iron faster for adequate hematopoesis. Some of the side-effects to be expected including risks of infusion reactions, phlebitis, headaches, nausea and fatigue.  The patient is willing to proceed. Patient education material was dispensed.  ?Goal is to keep ferritin level greater than 50 and resolution of anemia ? ?

## 2022-01-31 NOTE — Patient Instructions (Signed)

## 2022-02-01 DIAGNOSIS — G4700 Insomnia, unspecified: Secondary | ICD-10-CM | POA: Diagnosis not present

## 2022-02-01 DIAGNOSIS — K429 Umbilical hernia without obstruction or gangrene: Secondary | ICD-10-CM | POA: Diagnosis not present

## 2022-02-01 DIAGNOSIS — D509 Iron deficiency anemia, unspecified: Secondary | ICD-10-CM | POA: Diagnosis not present

## 2022-02-01 DIAGNOSIS — Z7952 Long term (current) use of systemic steroids: Secondary | ICD-10-CM | POA: Diagnosis not present

## 2022-02-01 DIAGNOSIS — K5730 Diverticulosis of large intestine without perforation or abscess without bleeding: Secondary | ICD-10-CM | POA: Diagnosis not present

## 2022-02-01 DIAGNOSIS — E236 Other disorders of pituitary gland: Secondary | ICD-10-CM | POA: Diagnosis not present

## 2022-02-01 DIAGNOSIS — Z86010 Personal history of colonic polyps: Secondary | ICD-10-CM | POA: Diagnosis not present

## 2022-02-01 DIAGNOSIS — Z9981 Dependence on supplemental oxygen: Secondary | ICD-10-CM | POA: Diagnosis not present

## 2022-02-01 DIAGNOSIS — D72829 Elevated white blood cell count, unspecified: Secondary | ICD-10-CM | POA: Diagnosis not present

## 2022-02-01 DIAGNOSIS — R69 Illness, unspecified: Secondary | ICD-10-CM | POA: Diagnosis not present

## 2022-02-01 DIAGNOSIS — Z87891 Personal history of nicotine dependence: Secondary | ICD-10-CM | POA: Diagnosis not present

## 2022-02-01 DIAGNOSIS — Z9851 Tubal ligation status: Secondary | ICD-10-CM | POA: Diagnosis not present

## 2022-02-01 DIAGNOSIS — N179 Acute kidney failure, unspecified: Secondary | ICD-10-CM | POA: Diagnosis not present

## 2022-02-01 DIAGNOSIS — E1165 Type 2 diabetes mellitus with hyperglycemia: Secondary | ICD-10-CM | POA: Diagnosis not present

## 2022-02-01 DIAGNOSIS — K219 Gastro-esophageal reflux disease without esophagitis: Secondary | ICD-10-CM | POA: Diagnosis not present

## 2022-02-01 DIAGNOSIS — E1142 Type 2 diabetes mellitus with diabetic polyneuropathy: Secondary | ICD-10-CM | POA: Diagnosis not present

## 2022-02-01 DIAGNOSIS — Z79899 Other long term (current) drug therapy: Secondary | ICD-10-CM | POA: Diagnosis not present

## 2022-02-01 DIAGNOSIS — E785 Hyperlipidemia, unspecified: Secondary | ICD-10-CM | POA: Diagnosis not present

## 2022-02-01 DIAGNOSIS — E871 Hypo-osmolality and hyponatremia: Secondary | ICD-10-CM | POA: Diagnosis not present

## 2022-02-01 DIAGNOSIS — R188 Other ascites: Secondary | ICD-10-CM | POA: Diagnosis not present

## 2022-02-01 DIAGNOSIS — E669 Obesity, unspecified: Secondary | ICD-10-CM | POA: Diagnosis not present

## 2022-02-01 DIAGNOSIS — I10 Essential (primary) hypertension: Secondary | ICD-10-CM | POA: Diagnosis not present

## 2022-02-01 DIAGNOSIS — Z6833 Body mass index (BMI) 33.0-33.9, adult: Secondary | ICD-10-CM | POA: Diagnosis not present

## 2022-02-04 ENCOUNTER — Telehealth: Payer: Self-pay | Admitting: Hematology and Oncology

## 2022-02-04 ENCOUNTER — Telehealth: Payer: Self-pay | Admitting: *Deleted

## 2022-02-04 ENCOUNTER — Institutional Professional Consult (permissible substitution): Payer: Medicare HMO | Admitting: Behavioral Health

## 2022-02-04 NOTE — Telephone Encounter (Signed)
Ms. Reesor called - LVM: she had to go out of town for emergency. ?She has appts tomorrow Tuesday 02/04/22 for labs, see Dr. Alvy Bimler and receive iron infusion. ?She would like to talk with scheduling to see what her options are for rescheduling these appts. ?Message sent to scheduling with this information. ?

## 2022-02-04 NOTE — Telephone Encounter (Signed)
Scheduled appointment per patient and provider. Patient aware.  ?

## 2022-02-04 NOTE — Telephone Encounter (Signed)
Connected with [name] [phone] (home) regarding FMLA paperwork.  Cover sheet reads for leave January 01, 2022 through July of 2023 yet did not select type of leave.       ? ?"I have not worked since March 7th and cannot do anything with this cancer and chemotherapy treatment.  Needs to be returned for a continuous leave.  Will not return to [place] until Saturday April 15th so just return the form." ? ?Advised 12-work week FMLA leave will expire June 7th.  Communicate with employer for next steps before expiration.  Employer release will need to be signed for further employer needs of medical information. ? ?Currently no further questions or needs.  Paperwork to [facility] bin for Counselling psychologist for provider review and signature.    ?

## 2022-02-05 ENCOUNTER — Inpatient Hospital Stay: Payer: Medicare HMO | Admitting: Dietician

## 2022-02-05 ENCOUNTER — Inpatient Hospital Stay: Payer: Medicare HMO

## 2022-02-05 ENCOUNTER — Inpatient Hospital Stay: Payer: Medicare HMO | Admitting: Hematology and Oncology

## 2022-02-05 NOTE — Telephone Encounter (Signed)
FMLA paperwork successfully faxed to [place].  Original copy [place] to [place] file. [place] [id number] ? ?No further completed form delivery actions performed or further instructions received. ? ?

## 2022-02-06 ENCOUNTER — Telehealth: Payer: Self-pay | Admitting: Behavioral Health

## 2022-02-06 ENCOUNTER — Institutional Professional Consult (permissible substitution): Payer: Medicare HMO | Admitting: Behavioral Health

## 2022-02-06 NOTE — Telephone Encounter (Signed)
Finally secured Pt contact today for 3:00pm session. Pt is invld w/Tx & cannot speak @ length. Pt agreed to session on 02/18/22 @ 3pm for 60 min telehealth. ? ?Dr. Theodis Shove ?

## 2022-02-09 ENCOUNTER — Inpatient Hospital Stay: Payer: Medicare HMO

## 2022-02-11 ENCOUNTER — Other Ambulatory Visit: Payer: Self-pay

## 2022-02-11 ENCOUNTER — Inpatient Hospital Stay: Payer: Medicare HMO

## 2022-02-11 VITALS — BP 146/68 | HR 82 | Temp 98.5°F | Resp 16

## 2022-02-11 DIAGNOSIS — R180 Malignant ascites: Secondary | ICD-10-CM | POA: Diagnosis not present

## 2022-02-11 DIAGNOSIS — E876 Hypokalemia: Secondary | ICD-10-CM | POA: Diagnosis not present

## 2022-02-11 DIAGNOSIS — C55 Malignant neoplasm of uterus, part unspecified: Secondary | ICD-10-CM | POA: Diagnosis not present

## 2022-02-11 DIAGNOSIS — Z79899 Other long term (current) drug therapy: Secondary | ICD-10-CM | POA: Diagnosis not present

## 2022-02-11 DIAGNOSIS — D508 Other iron deficiency anemias: Secondary | ICD-10-CM | POA: Diagnosis not present

## 2022-02-11 DIAGNOSIS — Z5111 Encounter for antineoplastic chemotherapy: Secondary | ICD-10-CM | POA: Diagnosis not present

## 2022-02-11 DIAGNOSIS — K909 Intestinal malabsorption, unspecified: Secondary | ICD-10-CM | POA: Diagnosis not present

## 2022-02-11 MED ORDER — SODIUM CHLORIDE 0.9% FLUSH
10.0000 mL | Freq: Once | INTRAVENOUS | Status: AC | PRN
  Administered 2022-02-11: 10 mL

## 2022-02-11 MED ORDER — HEPARIN SOD (PORK) LOCK FLUSH 100 UNIT/ML IV SOLN
500.0000 [IU] | Freq: Once | INTRAVENOUS | Status: AC | PRN
  Administered 2022-02-11: 500 [IU]

## 2022-02-11 MED ORDER — SODIUM CHLORIDE 0.9 % IV SOLN: Freq: Once | INTRAVENOUS | Status: AC

## 2022-02-11 MED ORDER — SODIUM CHLORIDE 0.9 % IV SOLN
200.0000 mg | Freq: Once | INTRAVENOUS | Status: AC
  Administered 2022-02-11: 200 mg via INTRAVENOUS
  Filled 2022-02-11: qty 200

## 2022-02-11 NOTE — Patient Instructions (Signed)

## 2022-02-11 NOTE — Progress Notes (Signed)
Pt refused to stay for full 30 minute observation period post Venofer infusion. Pt observed for 15 minutes. VSS. No complaints at time of discharge.  ?

## 2022-02-12 DIAGNOSIS — E871 Hypo-osmolality and hyponatremia: Secondary | ICD-10-CM | POA: Diagnosis not present

## 2022-02-12 DIAGNOSIS — R69 Illness, unspecified: Secondary | ICD-10-CM | POA: Diagnosis not present

## 2022-02-12 DIAGNOSIS — E785 Hyperlipidemia, unspecified: Secondary | ICD-10-CM | POA: Diagnosis not present

## 2022-02-12 DIAGNOSIS — E1142 Type 2 diabetes mellitus with diabetic polyneuropathy: Secondary | ICD-10-CM | POA: Diagnosis not present

## 2022-02-12 DIAGNOSIS — K429 Umbilical hernia without obstruction or gangrene: Secondary | ICD-10-CM | POA: Diagnosis not present

## 2022-02-12 DIAGNOSIS — I10 Essential (primary) hypertension: Secondary | ICD-10-CM | POA: Diagnosis not present

## 2022-02-12 DIAGNOSIS — Z86010 Personal history of colonic polyps: Secondary | ICD-10-CM | POA: Diagnosis not present

## 2022-02-12 DIAGNOSIS — N179 Acute kidney failure, unspecified: Secondary | ICD-10-CM | POA: Diagnosis not present

## 2022-02-12 DIAGNOSIS — G4700 Insomnia, unspecified: Secondary | ICD-10-CM | POA: Diagnosis not present

## 2022-02-12 DIAGNOSIS — Z79899 Other long term (current) drug therapy: Secondary | ICD-10-CM | POA: Diagnosis not present

## 2022-02-12 DIAGNOSIS — Z6833 Body mass index (BMI) 33.0-33.9, adult: Secondary | ICD-10-CM | POA: Diagnosis not present

## 2022-02-12 DIAGNOSIS — Z9851 Tubal ligation status: Secondary | ICD-10-CM | POA: Diagnosis not present

## 2022-02-12 DIAGNOSIS — D72829 Elevated white blood cell count, unspecified: Secondary | ICD-10-CM | POA: Diagnosis not present

## 2022-02-12 DIAGNOSIS — K5730 Diverticulosis of large intestine without perforation or abscess without bleeding: Secondary | ICD-10-CM | POA: Diagnosis not present

## 2022-02-12 DIAGNOSIS — Z87891 Personal history of nicotine dependence: Secondary | ICD-10-CM | POA: Diagnosis not present

## 2022-02-12 DIAGNOSIS — E1165 Type 2 diabetes mellitus with hyperglycemia: Secondary | ICD-10-CM | POA: Diagnosis not present

## 2022-02-12 DIAGNOSIS — K219 Gastro-esophageal reflux disease without esophagitis: Secondary | ICD-10-CM | POA: Diagnosis not present

## 2022-02-12 DIAGNOSIS — R188 Other ascites: Secondary | ICD-10-CM | POA: Diagnosis not present

## 2022-02-12 DIAGNOSIS — Z9981 Dependence on supplemental oxygen: Secondary | ICD-10-CM | POA: Diagnosis not present

## 2022-02-12 DIAGNOSIS — E236 Other disorders of pituitary gland: Secondary | ICD-10-CM | POA: Diagnosis not present

## 2022-02-12 DIAGNOSIS — E669 Obesity, unspecified: Secondary | ICD-10-CM | POA: Diagnosis not present

## 2022-02-12 DIAGNOSIS — Z7952 Long term (current) use of systemic steroids: Secondary | ICD-10-CM | POA: Diagnosis not present

## 2022-02-12 DIAGNOSIS — D509 Iron deficiency anemia, unspecified: Secondary | ICD-10-CM | POA: Diagnosis not present

## 2022-02-14 ENCOUNTER — Encounter: Payer: Self-pay | Admitting: Hematology and Oncology

## 2022-02-14 ENCOUNTER — Other Ambulatory Visit: Payer: Medicare HMO

## 2022-02-14 ENCOUNTER — Inpatient Hospital Stay: Payer: Medicare HMO | Admitting: Hematology and Oncology

## 2022-02-14 ENCOUNTER — Encounter: Payer: Self-pay | Admitting: Genetic Counselor

## 2022-02-14 ENCOUNTER — Inpatient Hospital Stay (HOSPITAL_BASED_OUTPATIENT_CLINIC_OR_DEPARTMENT_OTHER): Payer: Medicare HMO | Admitting: Genetic Counselor

## 2022-02-14 ENCOUNTER — Other Ambulatory Visit: Payer: Self-pay

## 2022-02-14 ENCOUNTER — Inpatient Hospital Stay: Payer: Medicare HMO

## 2022-02-14 VITALS — BP 160/70 | HR 72 | Temp 97.9°F | Wt 179.0 lb

## 2022-02-14 DIAGNOSIS — Z8042 Family history of malignant neoplasm of prostate: Secondary | ICD-10-CM | POA: Diagnosis not present

## 2022-02-14 DIAGNOSIS — D509 Iron deficiency anemia, unspecified: Secondary | ICD-10-CM | POA: Diagnosis not present

## 2022-02-14 DIAGNOSIS — Z803 Family history of malignant neoplasm of breast: Secondary | ICD-10-CM | POA: Diagnosis not present

## 2022-02-14 DIAGNOSIS — C55 Malignant neoplasm of uterus, part unspecified: Secondary | ICD-10-CM | POA: Diagnosis not present

## 2022-02-14 DIAGNOSIS — K909 Intestinal malabsorption, unspecified: Secondary | ICD-10-CM | POA: Diagnosis not present

## 2022-02-14 DIAGNOSIS — Z808 Family history of malignant neoplasm of other organs or systems: Secondary | ICD-10-CM

## 2022-02-14 DIAGNOSIS — R180 Malignant ascites: Secondary | ICD-10-CM | POA: Diagnosis not present

## 2022-02-14 DIAGNOSIS — E876 Hypokalemia: Secondary | ICD-10-CM | POA: Diagnosis not present

## 2022-02-14 DIAGNOSIS — Z5111 Encounter for antineoplastic chemotherapy: Secondary | ICD-10-CM | POA: Diagnosis not present

## 2022-02-14 DIAGNOSIS — Z79899 Other long term (current) drug therapy: Secondary | ICD-10-CM | POA: Diagnosis not present

## 2022-02-14 DIAGNOSIS — D508 Other iron deficiency anemias: Secondary | ICD-10-CM | POA: Diagnosis not present

## 2022-02-14 LAB — CBC WITH DIFFERENTIAL (CANCER CENTER ONLY)
Abs Immature Granulocytes: 0.04 10*3/uL (ref 0.00–0.07)
Basophils Absolute: 0 10*3/uL (ref 0.0–0.1)
Basophils Relative: 0 %
Eosinophils Absolute: 0.1 10*3/uL (ref 0.0–0.5)
Eosinophils Relative: 1 %
HCT: 27 % — ABNORMAL LOW (ref 36.0–46.0)
Hemoglobin: 8.3 g/dL — ABNORMAL LOW (ref 12.0–15.0)
Immature Granulocytes: 1 %
Lymphocytes Relative: 32 %
Lymphs Abs: 2.3 10*3/uL (ref 0.7–4.0)
MCH: 23.1 pg — ABNORMAL LOW (ref 26.0–34.0)
MCHC: 30.7 g/dL (ref 30.0–36.0)
MCV: 75 fL — ABNORMAL LOW (ref 80.0–100.0)
Monocytes Absolute: 0.9 10*3/uL (ref 0.1–1.0)
Monocytes Relative: 13 %
Neutro Abs: 3.7 10*3/uL (ref 1.7–7.7)
Neutrophils Relative %: 53 %
Platelet Count: 294 10*3/uL (ref 150–400)
RBC: 3.6 MIL/uL — ABNORMAL LOW (ref 3.87–5.11)
RDW: 19.4 % — ABNORMAL HIGH (ref 11.5–15.5)
WBC Count: 7 10*3/uL (ref 4.0–10.5)
nRBC: 0 % (ref 0.0–0.2)

## 2022-02-14 LAB — CMP (CANCER CENTER ONLY)
ALT: 8 U/L (ref 0–44)
AST: 12 U/L — ABNORMAL LOW (ref 15–41)
Albumin: 3.8 g/dL (ref 3.5–5.0)
Alkaline Phosphatase: 110 U/L (ref 38–126)
Anion gap: 7 (ref 5–15)
BUN: 6 mg/dL — ABNORMAL LOW (ref 8–23)
CO2: 29 mmol/L (ref 22–32)
Calcium: 9.1 mg/dL (ref 8.9–10.3)
Chloride: 103 mmol/L (ref 98–111)
Creatinine: 0.73 mg/dL (ref 0.44–1.00)
GFR, Estimated: >60 mL/min (ref >60)
Glucose, Bld: 85 mg/dL (ref 70–99)
Potassium: 3.2 mmol/L — ABNORMAL LOW (ref 3.5–5.1)
Sodium: 139 mmol/L (ref 135–145)
Total Bilirubin: 0.3 mg/dL (ref 0.3–1.2)
Total Protein: 6.9 g/dL (ref 6.5–8.1)

## 2022-02-14 MED ORDER — SODIUM CHLORIDE 0.9% FLUSH
10.0000 mL | Freq: Once | INTRAVENOUS | Status: AC
  Administered 2022-02-14: 10 mL

## 2022-02-14 MED ORDER — HEPARIN SOD (PORK) LOCK FLUSH 100 UNIT/ML IV SOLN
500.0000 [IU] | Freq: Once | INTRAVENOUS | Status: AC
  Administered 2022-02-14: 500 [IU]

## 2022-02-14 NOTE — Assessment & Plan Note (Signed)
Overall, she tolerated treatment very well except for recent weight change and significant anemia ?The cause of her anemia is multifactorial ?We will proceed with treatment tomorrow as scheduled ?I plan to reduce the dose of carboplatin to AUC of 5 due to severe anemia ?She will also return here next week to get her CBC rechecked for possible transfusion support or further intravenous iron infusion ?I plan to see her again prior to cycle 3 of treatment and repeat imaging study for objective assessment of response to therapy after cycle 3 ?

## 2022-02-14 NOTE — Progress Notes (Signed)
[place] ?OFFICE PROGRESS NOTE ? ?Patient Care Team: ?[name], MD as PCP - General (Internal Medicine) ?[name], RN as [facility] Management ? ?ASSESSMENT & PLAN:  ?Uterine cancer ([place]) ?Overall, she tolerated treatment very well except for recent weight change and significant anemia ?The cause of her anemia is multifactorial ?We will proceed with treatment tomorrow as scheduled ?I plan to reduce the dose of carboplatin to AUC of 5 due to severe anemia ?She will also return here next week to get her CBC rechecked for possible transfusion support or further intravenous iron infusion ?I plan to see her again prior to cycle 3 of treatment and repeat imaging study for objective assessment of response to therapy after cycle 3 ? ?Iron deficiency anemia ?She has multifactorial anemia, combination of iron deficiency and anemia chronic illness from chemotherapy ?We will proceed with treatment tomorrow with reduced dose ?She will return next week for blood count monitoring ?If her hemoglobin is less than 8, she will receive a unit of blood ?We discussed some of the risks, benefits, and alternatives of blood transfusions. The patient is symptomatic from anemia and the hemoglobin level is critically low.  Some of the side-effects to be expected including risks of transfusion reactions, chills, infection, syndrome of volume overload and risk of hospitalization from various reasons and the patient is willing to proceed. ?However, if her hemoglobin is greater than 8, she will receive intravenous iron sucrose instead ?I have increased the dose of IV iron from 200 mg to 400 mg. ? ? ?Malignant ascites ?She has minimum ascites on exam ?Observe ?She does not need therapeutic paracentesis ? ?Hypokalemia ?She has intermittent hypokalemia of unknown etiology ?She is not symptomatic ?I recommend observation only ?Potassium supplement can exacerbate nausea ?I will check magnesium  level in her next visit ? ?Orders Placed This Encounter  ?Procedures  ? Magnesium  ?  Standing Status:   Standing  ?  Number of Occurrences:   2  ?  Standing Expiration Date:   02/15/2023  ? Sample to Blood Bank  ?  Standing Status:   Standing  ?  Number of Occurrences:   69  ?  Standing Expiration Date:   02/15/2023  ? ABO/Rh  ?  Standing Status:   Future  ?  Standing Expiration Date:   02/15/2023  ? ? ?All questions were answered. The patient knows to call the clinic with any problems, questions or concerns. ?The total time spent in the appointment was 40 minutes encounter with patients including review of chart and various tests results, discussions about plan of care and coordination of care plan ?  ?[name], MD ?02/14/2022 1:16 PM ? ?INTERVAL HISTORY: ?Please see below for problem oriented charting. ?she returns for treatment follow-up seen prior to cycle 2 of treatment ?She is doing well ?No recent vaginal bleeding ?Her appetite is fair ?She has occasional nausea but no vomiting ?Denies progressive abdominal distention ?No peripheral neuropathy from treatment ? ?REVIEW OF SYSTEMS:   ?Constitutional: Denies fevers, chills or abnormal weight loss ?Eyes: Denies blurriness of vision ?Ears, nose, mouth, throat, and face: Denies mucositis or sore throat ?Respiratory: Denies cough, dyspnea or wheezes ?Cardiovascular: Denies palpitation, chest discomfort or lower extremity swelling ?Skin: Denies abnormal skin rashes ?Lymphatics: Denies new lymphadenopathy or easy bruising ?Neurological:Denies numbness, tingling or new weaknesses ?Behavioral/Psych: Mood is stable, no new changes  ?All other systems were reviewed with the patient and are negative. ? ?I have reviewed the  past medical history, past surgical history, social history and family history with the patient and they are unchanged from previous note. ? ?ALLERGIES:  is allergic to codeine sulfate, penicillins, percocet [oxycodone-acetaminophen], codeine, and  lisinopril. ? ?MEDICATIONS:  ?Current Outpatient Medications  ?Medication Sig Dispense Refill  ? acetaminophen (TYLENOL) 500 MG tablet Take 1,000 mg by mouth every 6 (six) hours as needed for mild pain.    ? albuterol (VENTOLIN HFA) 108 (90 Base) MCG/ACT inhaler Inhale 2 puffs into the lungs every 4 (four) hours as needed for wheezing or shortness of breath. 8 g 0  ? amLODipine (NORVASC) 10 MG tablet Take 1 tablet (10 mg total) by mouth daily. (Patient taking differently: Take 10 mg by mouth at bedtime.) 90 tablet 2  ? atorvastatin (LIPITOR) 20 MG tablet Take 1 tablet (20 mg total) by mouth daily. (Patient taking differently: Take 20 mg by mouth at bedtime.) 90 tablet 3  ? Blood Glucose Monitoring Suppl (ONE TOUCH ULTRA MINI) w/Device KIT Please use as directed. 1 each 0  ? Cholecalciferol 1000 units capsule Take 1 capsule (1,000 Units total) by mouth daily. 90 capsule 1  ? dexamethasone (DECADRON) 4 MG tablet Take 2 tablets by mouth the night before and 2 tablets the morning of chemotherapy, every 3 weeks for 6 cycles 36 tablet 6  ? dorzolamide-timolol (COSOPT) 22.3-6.8 MG/ML ophthalmic solution Place 1 drop into both eyes 2 (two) times daily.    ? glucose blood (ONE TOUCH ULTRA TEST) test strip Use as instructed 100 each 1  ? Insulin Pen Needle 32G X 4 MM MISC 1 each by Does not apply route daily. 100 each 3  ? latanoprost (XALATAN) 0.005 % ophthalmic solution Place 1 drop into both eyes at bedtime.    ? lidocaine-prilocaine (EMLA) cream Apply to affected area once 30 g 3  ? liraglutide (VICTOZA) 18 MG/3ML SOPN PLEASE START TAKING VICTOZA 0.6MG DAILY WEEK 1, THEN TAKE 1.2MG DAILY WEEK 2, AND THEN TAKE 1.8MG DAILY THEREAFTER (Patient taking differently: Inject 1.2 mg into the skin daily.) 9 mL 3  ? losartan (COZAAR) 100 MG tablet Take 100 mg by mouth daily.    ? ondansetron (ZOFRAN) 8 MG tablet Take 1 tablet (8 mg total) by mouth 2 (two) times daily as needed. Start on the third day after chemotherapy. 30 tablet 1   ? ONETOUCH DELICA LANCETS 77O MISC Please use as directed. 100 each 0  ? polyethylene glycol powder (GLYCOLAX/MIRALAX) 17 GM/SCOOP powder Take 17 g by mouth daily. 238 g 0  ? prochlorperazine (COMPAZINE) 10 MG tablet Take 1 tablet (10 mg total) by mouth every 6 (six) hours as needed (Nausea or vomiting). 30 tablet 1  ? traMADol (ULTRAM) 50 MG tablet Take 2 tablets (100 mg total) by mouth every 6 (six) hours as needed for moderate pain. 60 tablet 0  ? ?No current facility-administered medications for this visit.  ? ? ?SUMMARY OF ONCOLOGIC HISTORY: ?Oncology History Overview Note  ?High grade serous, Her2/neu neg, MMR normal, MSI stable ?  ?Uterine cancer ([place])  ?10/12/2021 Imaging  ? US pelvis ? ?1. Heterogeneous endometrial thickening with appearance of moderate complex fluid in the endometrial canal, possible hemorrhagic material. Endometrial thickness is considered abnormal for an asymptomatic post-menopausal female. Endometrial sampling should be considered to exclude carcinoma. ?2. Nonvisualized ovary ?3. Heterogeneous thick-walled urinary bladder, correlate for cystitis. ?  ?12/28/2021 Pathology Results  ? CYTOLOGY - NON PAP  ?CASE: [id number]  ?PATIENT: Sherri Gill  ?Non-Gynecological Cytology Report  ? ?  Clinical History: Abnormal pelvic US, highly suspicious of malignant ascites  ?Specimen Submitted:  A. ASCITES, PARACENTESIS:  ? ? ?FINAL MICROSCOPIC DIAGNOSIS:  ?- Malignant cells consistent with adenocarcinoma  ?- See comment  ? ?SPECIMEN ADEQUACY:  ?Satisfactory for evaluation  ? ?DIAGNOSTIC COMMENTS:  ?Immunohistochemical stains show that the tumor cells are positive for CK7 and PAX8 while they are negative for CK20, CDX2 and ER, consistent with above interpretation.  Additionally, the tumor cells are diffusely positive for p53 (clonal overexpression), suggestive of a high-grade serous carcinoma.  ?  ?12/28/2021 Imaging  ? US pelvis ?1. Persistent abnormal appearance of the endometrium, heterogeneously  thickened and complex with possible intraluminal fluid. This is similar to that seen on December 2022 ultrasound. Endometrial thickness is considered abnormal for an asymptomatic post-menopausal female. Endomet

## 2022-02-14 NOTE — Assessment & Plan Note (Signed)
She has multifactorial anemia, combination of iron deficiency and anemia chronic illness from chemotherapy ?We will proceed with treatment tomorrow with reduced dose ?She will return next week for blood count monitoring ?If her hemoglobin is less than 8, she will receive a unit of blood ?We discussed some of the risks, benefits, and alternatives of blood transfusions. The patient is symptomatic from anemia and the hemoglobin level is critically low.  Some of the side-effects to be expected including risks of transfusion reactions, chills, infection, syndrome of volume overload and risk of hospitalization from various reasons and the patient is willing to proceed. ?However, if her hemoglobin is greater than 8, she will receive intravenous iron sucrose instead ?I have increased the dose of IV iron from 200 mg to 400 mg. ? ?

## 2022-02-14 NOTE — Assessment & Plan Note (Signed)
She has minimum ascites on exam ?Observe ?She does not need therapeutic paracentesis ?

## 2022-02-14 NOTE — Assessment & Plan Note (Signed)
She has intermittent hypokalemia of unknown etiology ?She is not symptomatic ?I recommend observation only ?Potassium supplement can exacerbate nausea ?I will check magnesium level in her next visit ?

## 2022-02-14 NOTE — Progress Notes (Signed)
REFERRING PROVIDER: ?[name], MD ?[address] ?[place], [place] [address] ? ?PRIMARY PROVIDER:  ?[name], MD ? ?PRIMARY REASON FOR VISIT:  ?1. Malignant neoplasm of uterus, unspecified site [facility])   ?2. Family history of thyroid cancer   ?3. Family history of prostate cancer   ?4. Family history of breast cancer   ? ?HISTORY OF PRESENT ILLNESS:   ?Sherri Gill, a 71 y.o. female, was seen for a [facility] cancer genetics consultation at the request of Dr. Alvy Bimler due to a personal and family history of cancer.  Sherri Gill presents to clinic today to discuss the possibility of a hereditary predisposition to cancer, to discuss genetic testing, and to further clarify her future cancer risks, as well as potential cancer risks for family members.  ? ?In March 2023, at the age of 62, Sherri Gill was diagnosed with uterine cancer. The cancer is MSI stable and normal IHC. ? 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  ?Uterine cancer ([place])  ?10/12/2021 Imaging  ? US pelvis ? ?1. Heterogeneous endometrial thickening with appearance of moderate complex fluid in the endometrial canal, possible hemorrhagic material. Endometrial thickness is considered abnormal for an asymptomatic post-menopausal female. Endometrial sampling should be considered to exclude carcinoma. ?2. Nonvisualized ovary ?3. Heterogeneous thick-walled urinary bladder, correlate for cystitis. ?  ?12/28/2021 Pathology Results  ? CYTOLOGY - NON PAP  ?CASE: [id number]  ?PATIENT: Sherri Gill  ?Non-Gynecological Cytology Report  ? ?Clinical History: Abnormal pelvic US, highly suspicious of malignant ascites  ?Specimen Submitted:  A. ASCITES, PARACENTESIS:  ? ? ?FINAL MICROSCOPIC DIAGNOSIS:  ?- Malignant cells consistent with adenocarcinoma  ?- See comment  ? ?SPECIMEN ADEQUACY:  ?Satisfactory for evaluation  ? ?DIAGNOSTIC COMMENTS:  ?Immunohistochemical stains show that the  tumor cells are positive for CK7 and PAX8 while they are negative for CK20, CDX2 and ER, consistent with above interpretation.  Additionally, the tumor cells are diffusely positive for p53 (clonal overexpression), suggestive of a high-grade serous carcinoma.  ?  ?12/28/2021 Imaging  ? US pelvis ?1. Persistent abnormal appearance of the endometrium, heterogeneously thickened and complex with possible intraluminal fluid. This is similar to that seen on December 2022 ultrasound. Endometrial thickness is considered abnormal for an asymptomatic post-menopausal female. Endometrial sampling should be considered to exclude carcinoma. ?2. Nonvisualization of the ovaries. ?  ?12/28/2021 Imaging  ? 1. Large volume ascites. ?2. Small umbilical hernia containing fat and ascites. ?3. Haziness of anterior omentum most likely related to ascites, other etiologies such as metastatic disease can not be excluded. ?4. Diffuse colonic diverticulosis without evidence for diverticulitis. ?  ?12/29/2021 Procedure  ? Successful ultrasound-guided paracentesis yielding 2.8 liters of peritoneal fluid. ?  ?01/14/2022 Initial Diagnosis  ? Uterine cancer [facility]) ?  ?01/14/2022 Cancer Staging  ? Staging form: Corpus Uteri - Carcinoma and Carcinosarcoma, AJCC 8th Edition ?- Clinical: Stage III (cT3, cN0, cM0) - Signed by [name], MD on 01/14/2022 ?Stage prefix: Initial diagnosis ? ?  ?01/21/2022 Procedure  ? Placement of single lumen port a cath via right internal jugular vein. The catheter tip lies at the cavo-atrial junction. A power injectable port a cath was placed and is ready for immediate use. ?  ?  ?01/25/2022 -  Chemotherapy  ? Patient is on Treatment Plan : UTERINE Carboplatin AUC 6 / Paclitaxel q21d  ? ?  ?  ? ? ? ?RISK FACTORS:  ?Menarche was at age 61.  ?  First live birth at age 52.  ?OCP use for approximately 10 years.  ?Ovaries intact: yes.  ?Uterus intact: yes.  ?Menopausal status: postmenopausal.  ?HRT use: 0 years. ?Colonoscopy: yes;   history of colon polyps . ?Mammogram within the last year: no. ?Any excessive radiation exposure in the past: no ? ?Past Medical History:  ?Diagnosis Date  ? AKI (acute kidney injury) ([place])   ? History of  ? Ascites   ? 09/2021 paracentesis with almost 3 L of fluid 12/2021 Paracentesis performed with 2.8 L  ? Chronic anemia 08/29/2012  ? Need colonoscopy or report. Need iron panel.   ? Dyslipidemia 06/30/2007  ? Dyspnea   ? Essential hypertension, benign 06/30/2007  ? GERD 06/30/2007  ? History of blood transfusion   ? History of fatty infiltration of liver   ? History of migraine   ? Hx of Herpes simplex meningitis 2015  ? Also noted to have primary empty sella on imaging at this admission  ? Insomnia 06/11/2012  ? Major depressive disorder, recurrent episode, moderate with anxious distress ([place]) 06/30/2007  ? Nausea and vomiting 01/13/2022  ? Obesity, BMI 35-40 06/11/2012  ? Peripheral neuropathy 2/2 T2DM 12/28/2009  ? Primary empty sella syndrome ([place]) 2015  ? Noted on imaging during hospitalization for herpes meningitis; no pituitary mass, no hormone w/u at that time, hormonally asymptomatic  ? Type 2 diabetes mellitus with neurological complications ([place]) 54/62/7035  ? ? ?Past Surgical History:  ?Procedure Laterality Date  ? COLONOSCOPY W/ POLYPECTOMY  06/28/2004  ? DENTAL SURGERY    ? DILATATION & CURETTAGE/HYSTEROSCOPY WITH MYOSURE N/A 01/15/2022  ? Procedure: [place];  Surgeon: Lafonda Mosses, MD;  Location: [facility];  Service: Gynecology;  Laterality: N/A;  DO NOT OPEN HYSTEROSCOPY KIT  ? IR IMAGING GUIDED PORT INSERTION  01/18/2022  ? OPERATIVE ULTRASOUND N/A 01/15/2022  ? Procedure: OPERATIVE ULTRASOUND;  Surgeon: Lafonda Mosses, MD;  Location: [facility];  Service: Gynecology;  Laterality: N/A;  ? TUBAL LIGATION    ? ? ?Social History  ? ?Socioeconomic History  ? Marital status: Single  ?  Spouse name: Not on file  ? Number of children: Not on file  ? Years of  education: 92  ? Highest education level: Not on file  ?Occupational History  ? Occupation: Press photographer  ?  Employer: [employer]  ?Tobacco Use  ? Smoking status: Former  ?  Types: Cigarettes  ?  Quit date: 10/28/1978  ?  Years since quitting: 43.3  ? Smokeless tobacco: Never  ?Vaping Use  ? Vaping Use: Never used  ?Substance and Sexual Activity  ? Alcohol use: No  ?  Alcohol/week: 0.0 standard drinks  ? Drug use: No  ? Sexual activity: Not Currently  ?  Partners: Male  ?  Birth control/protection: Post-menopausal  ?Other Topics Concern  ? Not on file  ?Social History Narrative  ? Not on file  ? ?Social Determinants of Health  ? ?Financial Resource Strain: Not on file  ?Food Insecurity: Not on file  ?Transportation Needs: Not on file  ?Physical Activity: Not on file  ?Stress: Not on file  ?Social Connections: Not on file  ?  ? ?FAMILY HISTORY:  ?We obtained a detailed, 4-generation family history.  Significant diagnoses are listed below: ?Family History  ?Adopted: Yes  ?Problem Relation Age of Onset  ? Dementia Mother   ? Breast cancer Sister   ?     paternal half-sister  ? Liver cancer  Brother   ?     maternal half-brother  ? Thyroid cancer Maternal Aunt   ? Prostate cancer Maternal Uncle   ? Prostate cancer Cousin   ? Breast cancer Other   ? ? ? ?Sherri Gill maternal half-brother was diagnosed with liver cancer. Her paternal half-sister was diagnosed with breast cancer at age 59. Of note, she has limited information about her paternal half-siblings. Sherri Gill maternal aunt was diagnosed with thyroid cancer at an unknown age, she is deceased. Her maternal uncle was diagnosed with prostate cancer at an unknown age, he died at age 20. Her paternal great aunt (grandmother's sister) was diagnosed with breast cancer at an unknown age, she is deceased. Sherri Gill is unaware of previous family history of genetic testing for hereditary cancer risks. There is no reported Ashkenazi Jewish ancestry.  ? ?GENETIC COUNSELING ASSESSMENT:  Sherri Gill is a 71 y.o. female with a personal and family history of cancer which is somewhat suggestive of a hereditary predisposition to cancer. We, therefore, discussed and recommended the following at today'

## 2022-02-15 ENCOUNTER — Other Ambulatory Visit (HOSPITAL_COMMUNITY): Payer: Self-pay

## 2022-02-15 ENCOUNTER — Inpatient Hospital Stay: Payer: Medicare HMO

## 2022-02-15 VITALS — BP 148/69 | HR 97 | Temp 98.3°F | Resp 18

## 2022-02-15 DIAGNOSIS — Z79899 Other long term (current) drug therapy: Secondary | ICD-10-CM | POA: Diagnosis not present

## 2022-02-15 DIAGNOSIS — C55 Malignant neoplasm of uterus, part unspecified: Secondary | ICD-10-CM | POA: Diagnosis not present

## 2022-02-15 DIAGNOSIS — E876 Hypokalemia: Secondary | ICD-10-CM | POA: Diagnosis not present

## 2022-02-15 DIAGNOSIS — Z5111 Encounter for antineoplastic chemotherapy: Secondary | ICD-10-CM | POA: Diagnosis not present

## 2022-02-15 DIAGNOSIS — K909 Intestinal malabsorption, unspecified: Secondary | ICD-10-CM | POA: Diagnosis not present

## 2022-02-15 DIAGNOSIS — R180 Malignant ascites: Secondary | ICD-10-CM | POA: Diagnosis not present

## 2022-02-15 DIAGNOSIS — D508 Other iron deficiency anemias: Secondary | ICD-10-CM | POA: Diagnosis not present

## 2022-02-15 MED ORDER — PALONOSETRON HCL INJECTION 0.25 MG/5ML
0.2500 mg | Freq: Once | INTRAVENOUS | Status: AC
  Administered 2022-02-15: 0.25 mg via INTRAVENOUS
  Filled 2022-02-15: qty 5

## 2022-02-15 MED ORDER — FAMOTIDINE IN NACL 20-0.9 MG/50ML-% IV SOLN
20.0000 mg | Freq: Once | INTRAVENOUS | Status: AC
  Administered 2022-02-15: 20 mg via INTRAVENOUS
  Filled 2022-02-15: qty 50

## 2022-02-15 MED ORDER — SODIUM CHLORIDE 0.9 % IV SOLN
175.0000 mg/m2 | Freq: Once | INTRAVENOUS | Status: AC
  Administered 2022-02-15: 330 mg via INTRAVENOUS
  Filled 2022-02-15: qty 55

## 2022-02-15 MED ORDER — SODIUM CHLORIDE 0.9 % IV SOLN
470.0000 mg | Freq: Once | INTRAVENOUS | Status: AC
  Administered 2022-02-15: 470 mg via INTRAVENOUS
  Filled 2022-02-15: qty 47

## 2022-02-15 MED ORDER — HEPARIN SOD (PORK) LOCK FLUSH 100 UNIT/ML IV SOLN
500.0000 [IU] | Freq: Once | INTRAVENOUS | Status: AC | PRN
  Administered 2022-02-15: 500 [IU]

## 2022-02-15 MED ORDER — DIPHENHYDRAMINE HCL 50 MG/ML IJ SOLN
25.0000 mg | Freq: Once | INTRAMUSCULAR | Status: AC
  Administered 2022-02-15: 25 mg via INTRAVENOUS
  Filled 2022-02-15: qty 1

## 2022-02-15 MED ORDER — SODIUM CHLORIDE 0.9 % IV SOLN: Freq: Once | INTRAVENOUS | Status: AC

## 2022-02-15 MED ORDER — SODIUM CHLORIDE 0.9 % IV SOLN
150.0000 mg | Freq: Once | INTRAVENOUS | Status: AC
  Administered 2022-02-15: 150 mg via INTRAVENOUS
  Filled 2022-02-15: qty 150

## 2022-02-15 MED ORDER — SODIUM CHLORIDE 0.9% FLUSH
10.0000 mL | INTRAVENOUS | Status: DC | PRN
  Administered 2022-02-15: 10 mL

## 2022-02-15 MED ORDER — SODIUM CHLORIDE 0.9 % IV SOLN
10.0000 mg | Freq: Once | INTRAVENOUS | Status: AC
  Administered 2022-02-15: 10 mg via INTRAVENOUS
  Filled 2022-02-15: qty 10

## 2022-02-15 NOTE — Patient Instructions (Signed)
[facility]ICAL [facility]COLOGY  Discharge Instructions: Thank you for choosing [facility] to provide your oncology and hematology care.   If you have a lab appointment with the Cancer Center, please go directly to the Cancer Center and check in at the registration area.   Wear comfortable clothing and clothing appropriate for easy access to any Portacath or PICC line.   We strive to give you quality time with your provider. You may need to reschedule your appointment if you arrive late (15 or more minutes).  Arriving late affects you and other patients whose appointments are after yours.  Also, if you miss three or more appointments without notifying the office, you may be dismissed from the clinic at the provider's discretion.      For prescription refill requests, have your pharmacy contact our office and allow 72 hours for refills to be completed.    Today you received the following chemotherapy and/or immunotherapy agents:  Paclitaxel & Carboplatin      To help prevent nausea and vomiting after your treatment, we encourage you to take your nausea medication as directed.  BELOW ARE SYMPTOMS THAT SHOULD BE REPORTED IMMEDIATELY: *FEVER GREATER THAN 100.4 F (38 C) OR HIGHER *CHILLS OR SWEATING *NAUSEA AND VOMITING THAT IS NOT CONTROLLED WITH YOUR NAUSEA MEDICATION *UNUSUAL SHORTNESS OF BREATH *UNUSUAL BRUISING OR BLEEDING *URINARY PROBLEMS (pain or burning when urinating, or frequent urination) *BOWEL PROBLEMS (unusual diarrhea, constipation, pain near the anus) TENDERNESS IN MOUTH AND THROAT WITH OR WITHOUT PRESENCE OF ULCERS (sore throat, sores in mouth, or a toothache) UNUSUAL RASH, SWELLING OR PAIN  UNUSUAL VAGINAL DISCHARGE OR ITCHING   Items with * indicate a potential emergency and should be followed up as soon as possible or go to the Emergency Department if any problems should occur.  Please show the CHEMOTHERAPY ALERT CARD or IMMUNOTHERAPY ALERT CARD  at check-in to the Emergency Department and triage nurse.  Should you have questions after your visit or need to cancel or reschedule your appointment, please contact [facility]ICAL [facility]COL[facility]  Dept: [phone]  and follow the prompts.  Office hours are 8:00 a.m. to 4:30 p.m. Monday - Friday. Please note that voicemails left after 4:00 p.m. may not be returned until the following business day.  We are closed weekends and major holidays. You have access to a nurse at all times for urgent questions. Please call the main number to the clinic Dept: [phone] and follow the prompts.   For any non-urgent questions, you may also contact your provider using MyChart. We now offer e-Visits for anyone 18 and older to request care online for non-urgent symptoms. For details visit [email or web address].   Also download the MyChart app! Go to the app store, search "MyChart", open the app, select [facility], and log in with your MyChart username and password.  Due to Covid, a mask is required upon entering the hospital/clinic. If you do not have a mask, one will be given to you upon arrival. For doctor visits, patients may have 1 support person aged 18 or older with them. For treatment visits, patients cannot have anyone with them due to current Covid guidelines and our immunocompromised population.   

## 2022-02-18 ENCOUNTER — Ambulatory Visit: Payer: Medicare HMO | Admitting: Behavioral Health

## 2022-02-18 DIAGNOSIS — F331 Major depressive disorder, recurrent, moderate: Secondary | ICD-10-CM

## 2022-02-18 DIAGNOSIS — F419 Anxiety disorder, unspecified: Secondary | ICD-10-CM

## 2022-02-18 NOTE — BH Specialist Note (Signed)
Integrated Behavioral Health via Telemedicine Visit ? ?02/18/2022 ?Sherri Gill ?[id number] ? ?Number of [facility] Clinician visits: 7 ?Session Start time: 1630 ?Session End time: 1700 ?Total time in minutes: 30 min ? ?Referring Provider: Dr. Lenise Herald, MD ?Patient/Family location: Pt is home in private ?[facility] Provider location: [facility] Office ?All persons participating in visit: Pt & Clinician ?Types of Service: Individual psychotherapy ? ?I connected with Sherri Gill and/or Sherri Gill  self  via  Telephone or Video Enabled Telemedicine Application  (Video is Caregility application) and verified that I am speaking with the correct person using two identifiers. Discussed confidentiality: Yes  ? ?I discussed the limitations of telemedicine and the availability of in person appointments.  Discussed there is a possibility of technology failure and discussed alternative modes of communication if that failure occurs. ? ?I discussed that engaging in this telemedicine visit, they consent to the provision of behavioral healthcare and the services will be billed under their insurance. ? ?Patient and/or legal guardian expressed understanding and consented to Telemedicine visit: Yes  ? ?Presenting Concerns: ?Patient and/or family reports the following symptoms/concerns: her anx/dep due to recent Dx of cancer has dec'd bc she is addressing the Dx head-on. She is paying attn to her routine around the chemo Tx. She is trying to monitor every step so she can have as much energy as possible. ? ?Pt is taking information in from the Staff @ the [facility] will ask any questions she needs answered. ?Duration of problem: since March 2023; Severity of problem: moderate ? ?Patient and/or Family's Strengths/Protective Factors: ?Social and Emotional competence, Concrete supports in place (healthy food, safe environments, etc.), and Sense of purpose ? ?Goals Addressed: ?Patient will: ? Reduce symptoms of:  anxiety, depression, and stress  ? Increase knowledge and/or ability of: coping skills, healthy habits, and stress reduction  ? Demonstrate ability to: Increase healthy adjustment to current life circumstances ? ?Progress towards Goals: ?Ongoing - Pt has her Son Romey @ [place] for the Day Prog ? ?Interventions: ?Interventions utilized:  Solution-Focused Strategies and Supportive Counseling ?Standardized Assessments completed: Not Needed ? ?Patient and/or Family Response: Pt is receptive to call today, even though missed; Pt RC to [facility] & requested RC from Clinician ? ?Assessment: ?Patient currently experiencing reduction in her initial anx/dep & tears over her cancer Dx. Pt is learning & growing & grateful for her support network.  ? ?Patient may benefit from cont'd Cslg. ? ?Plan: ?Follow up with behavioral health clinician on : 2-3 wks for 60 telehealth ?Behavioral recommendations: Attend nxt appt & journal your thoughts ?Referral(s): [facility] (In Clinic) and Cancer Support Grps via the Cancer Ctr & future Volunteer opportunity w/other cancer Pts ? ?I discussed the assessment and treatment plan with the patient and/or parent/guardian. They were provided an opportunity to ask questions and all were answered. They agreed with the plan and demonstrated an understanding of the instructions. ?  ?They were advised to call back or seek an in-person evaluation if the symptoms worsen or if the condition fails to improve as anticipated. ? ?[name], LMFT ?

## 2022-02-21 ENCOUNTER — Other Ambulatory Visit: Payer: Self-pay | Admitting: Hematology and Oncology

## 2022-02-21 ENCOUNTER — Telehealth: Payer: Self-pay

## 2022-02-21 DIAGNOSIS — D509 Iron deficiency anemia, unspecified: Secondary | ICD-10-CM

## 2022-02-21 DIAGNOSIS — C55 Malignant neoplasm of uterus, part unspecified: Secondary | ICD-10-CM

## 2022-02-21 NOTE — Telephone Encounter (Signed)
Patient left voicemail to [facility] stating she does not feel well and wants to speak with someone.  ?RN returned message. Patient states she feels cold and has decreased energy X2 days. Denies shortness of pain, dizziness, fevers and emesis. Patient is scheduled for lab and blood transfusion tomorrow. Patient was advised to come to scheduled appointment tomorrow as it sounds like she needs the blood transfusion. Patient verbalized understanding that should her symptoms worsen and she feels short of breath, lightheaded, etc. She should go to the ER for evaluation.  ?

## 2022-02-22 ENCOUNTER — Inpatient Hospital Stay: Payer: Medicare HMO

## 2022-02-22 ENCOUNTER — Other Ambulatory Visit: Payer: Self-pay

## 2022-02-22 VITALS — BP 143/80 | HR 78 | Temp 98.7°F | Resp 16

## 2022-02-22 DIAGNOSIS — D508 Other iron deficiency anemias: Secondary | ICD-10-CM | POA: Diagnosis not present

## 2022-02-22 DIAGNOSIS — Z5111 Encounter for antineoplastic chemotherapy: Secondary | ICD-10-CM | POA: Diagnosis not present

## 2022-02-22 DIAGNOSIS — K909 Intestinal malabsorption, unspecified: Secondary | ICD-10-CM | POA: Diagnosis not present

## 2022-02-22 DIAGNOSIS — E876 Hypokalemia: Secondary | ICD-10-CM | POA: Diagnosis not present

## 2022-02-22 DIAGNOSIS — C55 Malignant neoplasm of uterus, part unspecified: Secondary | ICD-10-CM

## 2022-02-22 DIAGNOSIS — R180 Malignant ascites: Secondary | ICD-10-CM | POA: Diagnosis not present

## 2022-02-22 DIAGNOSIS — Z79899 Other long term (current) drug therapy: Secondary | ICD-10-CM | POA: Diagnosis not present

## 2022-02-22 DIAGNOSIS — D509 Iron deficiency anemia, unspecified: Secondary | ICD-10-CM

## 2022-02-22 LAB — CBC WITH DIFFERENTIAL (CANCER CENTER ONLY)
Abs Immature Granulocytes: 0.03 10*3/uL (ref 0.00–0.07)
Basophils Absolute: 0 10*3/uL (ref 0.0–0.1)
Basophils Relative: 1 %
Eosinophils Absolute: 0.1 10*3/uL (ref 0.0–0.5)
Eosinophils Relative: 1 %
HCT: 26.9 % — ABNORMAL LOW (ref 36.0–46.0)
Hemoglobin: 8.4 g/dL — ABNORMAL LOW (ref 12.0–15.0)
Immature Granulocytes: 1 %
Lymphocytes Relative: 40 %
Lymphs Abs: 1.4 10*3/uL (ref 0.7–4.0)
MCH: 23.5 pg — ABNORMAL LOW (ref 26.0–34.0)
MCHC: 31.2 g/dL (ref 30.0–36.0)
MCV: 75.4 fL — ABNORMAL LOW (ref 80.0–100.0)
Monocytes Absolute: 0.2 10*3/uL (ref 0.1–1.0)
Monocytes Relative: 5 %
Neutro Abs: 1.8 10*3/uL (ref 1.7–7.7)
Neutrophils Relative %: 52 %
Platelet Count: 382 10*3/uL (ref 150–400)
RBC: 3.57 MIL/uL — ABNORMAL LOW (ref 3.87–5.11)
RDW: 19.7 % — ABNORMAL HIGH (ref 11.5–15.5)
WBC Count: 3.5 10*3/uL — ABNORMAL LOW (ref 4.0–10.5)
nRBC: 0 % (ref 0.0–0.2)

## 2022-02-22 LAB — CMP (CANCER CENTER ONLY)
ALT: 9 U/L (ref 0–44)
AST: 13 U/L — ABNORMAL LOW (ref 15–41)
Albumin: 3.9 g/dL (ref 3.5–5.0)
Alkaline Phosphatase: 103 U/L (ref 38–126)
Anion gap: 3 — ABNORMAL LOW (ref 5–15)
BUN: 11 mg/dL (ref 8–23)
CO2: 32 mmol/L (ref 22–32)
Calcium: 9.4 mg/dL (ref 8.9–10.3)
Chloride: 101 mmol/L (ref 98–111)
Creatinine: 0.64 mg/dL (ref 0.44–1.00)
GFR, Estimated: >60 mL/min (ref >60)
Glucose, Bld: 120 mg/dL — ABNORMAL HIGH (ref 70–99)
Potassium: 3.4 mmol/L — ABNORMAL LOW (ref 3.5–5.1)
Sodium: 136 mmol/L (ref 135–145)
Total Bilirubin: 0.5 mg/dL (ref 0.3–1.2)
Total Protein: 7.1 g/dL (ref 6.5–8.1)

## 2022-02-22 LAB — ABO/RH: ABO/RH(D): O POS

## 2022-02-22 LAB — SAMPLE TO BLOOD BANK

## 2022-02-22 LAB — MAGNESIUM: Magnesium: 1.2 mg/dL — ABNORMAL LOW (ref 1.7–2.4)

## 2022-02-22 MED ORDER — SODIUM CHLORIDE 0.9 % IV SOLN: Freq: Once | INTRAVENOUS | Status: AC

## 2022-02-22 MED ORDER — SODIUM CHLORIDE 0.9% FLUSH
10.0000 mL | Freq: Once | INTRAVENOUS | Status: AC
  Administered 2022-02-22: 10 mL

## 2022-02-22 MED ORDER — SODIUM CHLORIDE 0.9% FLUSH
10.0000 mL | Freq: Once | INTRAVENOUS | Status: AC | PRN
  Administered 2022-02-22: 10 mL

## 2022-02-22 MED ORDER — HEPARIN SOD (PORK) LOCK FLUSH 100 UNIT/ML IV SOLN
500.0000 [IU] | Freq: Once | INTRAVENOUS | Status: AC | PRN
  Administered 2022-02-22: 500 [IU]

## 2022-02-22 MED ORDER — SODIUM CHLORIDE 0.9 % IV SOLN
400.0000 mg | Freq: Once | INTRAVENOUS | Status: AC
  Administered 2022-02-22: 400 mg via INTRAVENOUS
  Filled 2022-02-22: qty 20

## 2022-02-22 NOTE — Patient Instructions (Signed)

## 2022-02-25 ENCOUNTER — Other Ambulatory Visit (HOSPITAL_COMMUNITY): Payer: Self-pay

## 2022-02-25 ENCOUNTER — Telehealth: Payer: Self-pay | Admitting: *Deleted

## 2022-02-25 ENCOUNTER — Other Ambulatory Visit: Payer: Self-pay | Admitting: Hematology and Oncology

## 2022-02-25 DIAGNOSIS — C55 Malignant neoplasm of uterus, part unspecified: Secondary | ICD-10-CM

## 2022-02-25 MED ORDER — PANTOPRAZOLE SODIUM 40 MG PO TBEC
40.0000 mg | DELAYED_RELEASE_TABLET | Freq: Every day | ORAL | 1 refills | Status: DC
  Filled 2022-02-25: qty 30, 30d supply, fill #0
  Filled 2022-04-02: qty 30, 30d supply, fill #1

## 2022-02-25 MED ORDER — PANTOPRAZOLE SODIUM 40 MG PO TBEC: 40.0000 mg | DELAYED_RELEASE_TABLET | Freq: Every day | ORAL | 1 refills | Status: DC

## 2022-02-25 NOTE — Telephone Encounter (Addendum)
Patient called - states having severe acid reflux which became worse over weekend. Describes pain as being at top of stomach. She said she has not been able to eat much.  Asking if a medication can be called in for this. ?Dr. Alvy Bimler informed.  ?Per Dr. Alvy Bimler - contacted patient with following: Patient can chew Tums. Also ordered Protonix - sent to [facility]. Patient should contact office if no improvement in 2-4 days  ?Patient verbalized understanding and requested  Rx be sent to [facility] instead. Contacted [facility], d/c'd RX there at patient request. RX sent to [facility] ?

## 2022-02-26 ENCOUNTER — Encounter: Payer: Medicare HMO | Admitting: Genetic Counselor

## 2022-02-26 ENCOUNTER — Other Ambulatory Visit: Payer: Medicare HMO

## 2022-03-01 DIAGNOSIS — E236 Other disorders of pituitary gland: Secondary | ICD-10-CM | POA: Diagnosis not present

## 2022-03-01 DIAGNOSIS — N179 Acute kidney failure, unspecified: Secondary | ICD-10-CM | POA: Diagnosis not present

## 2022-03-01 DIAGNOSIS — D72829 Elevated white blood cell count, unspecified: Secondary | ICD-10-CM | POA: Diagnosis not present

## 2022-03-01 DIAGNOSIS — Z87891 Personal history of nicotine dependence: Secondary | ICD-10-CM | POA: Diagnosis not present

## 2022-03-01 DIAGNOSIS — Z9981 Dependence on supplemental oxygen: Secondary | ICD-10-CM | POA: Diagnosis not present

## 2022-03-01 DIAGNOSIS — K219 Gastro-esophageal reflux disease without esophagitis: Secondary | ICD-10-CM | POA: Diagnosis not present

## 2022-03-01 DIAGNOSIS — D509 Iron deficiency anemia, unspecified: Secondary | ICD-10-CM | POA: Diagnosis not present

## 2022-03-01 DIAGNOSIS — E1165 Type 2 diabetes mellitus with hyperglycemia: Secondary | ICD-10-CM | POA: Diagnosis not present

## 2022-03-01 DIAGNOSIS — E871 Hypo-osmolality and hyponatremia: Secondary | ICD-10-CM | POA: Diagnosis not present

## 2022-03-01 DIAGNOSIS — R69 Illness, unspecified: Secondary | ICD-10-CM | POA: Diagnosis not present

## 2022-03-01 DIAGNOSIS — R188 Other ascites: Secondary | ICD-10-CM | POA: Diagnosis not present

## 2022-03-01 DIAGNOSIS — Z9851 Tubal ligation status: Secondary | ICD-10-CM | POA: Diagnosis not present

## 2022-03-01 DIAGNOSIS — E785 Hyperlipidemia, unspecified: Secondary | ICD-10-CM | POA: Diagnosis not present

## 2022-03-01 DIAGNOSIS — E669 Obesity, unspecified: Secondary | ICD-10-CM | POA: Diagnosis not present

## 2022-03-01 DIAGNOSIS — K5730 Diverticulosis of large intestine without perforation or abscess without bleeding: Secondary | ICD-10-CM | POA: Diagnosis not present

## 2022-03-01 DIAGNOSIS — Z6833 Body mass index (BMI) 33.0-33.9, adult: Secondary | ICD-10-CM | POA: Diagnosis not present

## 2022-03-01 DIAGNOSIS — Z79899 Other long term (current) drug therapy: Secondary | ICD-10-CM | POA: Diagnosis not present

## 2022-03-01 DIAGNOSIS — K429 Umbilical hernia without obstruction or gangrene: Secondary | ICD-10-CM | POA: Diagnosis not present

## 2022-03-01 DIAGNOSIS — G4700 Insomnia, unspecified: Secondary | ICD-10-CM | POA: Diagnosis not present

## 2022-03-01 DIAGNOSIS — Z86010 Personal history of colonic polyps: Secondary | ICD-10-CM | POA: Diagnosis not present

## 2022-03-01 DIAGNOSIS — E1142 Type 2 diabetes mellitus with diabetic polyneuropathy: Secondary | ICD-10-CM | POA: Diagnosis not present

## 2022-03-01 DIAGNOSIS — I10 Essential (primary) hypertension: Secondary | ICD-10-CM | POA: Diagnosis not present

## 2022-03-01 DIAGNOSIS — Z7952 Long term (current) use of systemic steroids: Secondary | ICD-10-CM | POA: Diagnosis not present

## 2022-03-02 DIAGNOSIS — R0600 Dyspnea, unspecified: Secondary | ICD-10-CM | POA: Diagnosis not present

## 2022-03-06 DIAGNOSIS — Z79899 Other long term (current) drug therapy: Secondary | ICD-10-CM | POA: Diagnosis not present

## 2022-03-06 DIAGNOSIS — E669 Obesity, unspecified: Secondary | ICD-10-CM | POA: Diagnosis not present

## 2022-03-06 DIAGNOSIS — E785 Hyperlipidemia, unspecified: Secondary | ICD-10-CM | POA: Diagnosis not present

## 2022-03-06 DIAGNOSIS — E1165 Type 2 diabetes mellitus with hyperglycemia: Secondary | ICD-10-CM | POA: Diagnosis not present

## 2022-03-06 DIAGNOSIS — Z9981 Dependence on supplemental oxygen: Secondary | ICD-10-CM | POA: Diagnosis not present

## 2022-03-06 DIAGNOSIS — R188 Other ascites: Secondary | ICD-10-CM | POA: Diagnosis not present

## 2022-03-06 DIAGNOSIS — E1142 Type 2 diabetes mellitus with diabetic polyneuropathy: Secondary | ICD-10-CM | POA: Diagnosis not present

## 2022-03-06 DIAGNOSIS — Z87891 Personal history of nicotine dependence: Secondary | ICD-10-CM | POA: Diagnosis not present

## 2022-03-06 DIAGNOSIS — G4700 Insomnia, unspecified: Secondary | ICD-10-CM | POA: Diagnosis not present

## 2022-03-06 DIAGNOSIS — Z86010 Personal history of colonic polyps: Secondary | ICD-10-CM | POA: Diagnosis not present

## 2022-03-06 DIAGNOSIS — Z7952 Long term (current) use of systemic steroids: Secondary | ICD-10-CM | POA: Diagnosis not present

## 2022-03-06 DIAGNOSIS — E236 Other disorders of pituitary gland: Secondary | ICD-10-CM | POA: Diagnosis not present

## 2022-03-06 DIAGNOSIS — N179 Acute kidney failure, unspecified: Secondary | ICD-10-CM | POA: Diagnosis not present

## 2022-03-06 DIAGNOSIS — D509 Iron deficiency anemia, unspecified: Secondary | ICD-10-CM | POA: Diagnosis not present

## 2022-03-06 DIAGNOSIS — R69 Illness, unspecified: Secondary | ICD-10-CM | POA: Diagnosis not present

## 2022-03-06 DIAGNOSIS — Z6833 Body mass index (BMI) 33.0-33.9, adult: Secondary | ICD-10-CM | POA: Diagnosis not present

## 2022-03-06 DIAGNOSIS — K5730 Diverticulosis of large intestine without perforation or abscess without bleeding: Secondary | ICD-10-CM | POA: Diagnosis not present

## 2022-03-06 DIAGNOSIS — D72829 Elevated white blood cell count, unspecified: Secondary | ICD-10-CM | POA: Diagnosis not present

## 2022-03-06 DIAGNOSIS — K429 Umbilical hernia without obstruction or gangrene: Secondary | ICD-10-CM | POA: Diagnosis not present

## 2022-03-06 DIAGNOSIS — K219 Gastro-esophageal reflux disease without esophagitis: Secondary | ICD-10-CM | POA: Diagnosis not present

## 2022-03-06 DIAGNOSIS — E871 Hypo-osmolality and hyponatremia: Secondary | ICD-10-CM | POA: Diagnosis not present

## 2022-03-06 DIAGNOSIS — I10 Essential (primary) hypertension: Secondary | ICD-10-CM | POA: Diagnosis not present

## 2022-03-06 DIAGNOSIS — Z9851 Tubal ligation status: Secondary | ICD-10-CM | POA: Diagnosis not present

## 2022-03-07 ENCOUNTER — Other Ambulatory Visit (HOSPITAL_COMMUNITY): Payer: Self-pay

## 2022-03-07 ENCOUNTER — Telehealth: Payer: Self-pay | Admitting: *Deleted

## 2022-03-07 NOTE — Telephone Encounter (Signed)
Call from [place] [facility] with [facility] requesting verbal order "Continue HHPT once a week x 4 weeks;every other week x 4 weeks for balance and safety". ?VO given - will route to PCP for approval or denial. ?Thanks ?

## 2022-03-08 ENCOUNTER — Inpatient Hospital Stay: Payer: Medicare HMO | Admitting: Hematology and Oncology

## 2022-03-08 ENCOUNTER — Other Ambulatory Visit: Payer: Self-pay

## 2022-03-08 ENCOUNTER — Other Ambulatory Visit: Payer: Self-pay | Admitting: Hematology and Oncology

## 2022-03-08 ENCOUNTER — Encounter: Payer: Self-pay | Admitting: Hematology and Oncology

## 2022-03-08 ENCOUNTER — Inpatient Hospital Stay: Payer: Medicare HMO | Attending: Gynecologic Oncology

## 2022-03-08 ENCOUNTER — Other Ambulatory Visit (HOSPITAL_COMMUNITY): Payer: Self-pay

## 2022-03-08 ENCOUNTER — Inpatient Hospital Stay: Payer: Medicare HMO

## 2022-03-08 VITALS — BP 140/60 | HR 98 | Temp 97.7°F | Resp 18 | Ht 63.0 in | Wt 175.8 lb

## 2022-03-08 DIAGNOSIS — Z79899 Other long term (current) drug therapy: Secondary | ICD-10-CM | POA: Insufficient documentation

## 2022-03-08 DIAGNOSIS — Z885 Allergy status to narcotic agent status: Secondary | ICD-10-CM | POA: Diagnosis not present

## 2022-03-08 DIAGNOSIS — E1149 Type 2 diabetes mellitus with other diabetic neurological complication: Secondary | ICD-10-CM

## 2022-03-08 DIAGNOSIS — K5730 Diverticulosis of large intestine without perforation or abscess without bleeding: Secondary | ICD-10-CM | POA: Insufficient documentation

## 2022-03-08 DIAGNOSIS — C55 Malignant neoplasm of uterus, part unspecified: Secondary | ICD-10-CM

## 2022-03-08 DIAGNOSIS — R188 Other ascites: Secondary | ICD-10-CM | POA: Insufficient documentation

## 2022-03-08 DIAGNOSIS — Z794 Long term (current) use of insulin: Secondary | ICD-10-CM | POA: Diagnosis not present

## 2022-03-08 DIAGNOSIS — Z880 Allergy status to penicillin: Secondary | ICD-10-CM | POA: Diagnosis not present

## 2022-03-08 DIAGNOSIS — Z7952 Long term (current) use of systemic steroids: Secondary | ICD-10-CM | POA: Diagnosis not present

## 2022-03-08 DIAGNOSIS — D509 Iron deficiency anemia, unspecified: Secondary | ICD-10-CM | POA: Diagnosis not present

## 2022-03-08 DIAGNOSIS — Z5111 Encounter for antineoplastic chemotherapy: Secondary | ICD-10-CM | POA: Insufficient documentation

## 2022-03-08 DIAGNOSIS — I700 Atherosclerosis of aorta: Secondary | ICD-10-CM | POA: Diagnosis not present

## 2022-03-08 DIAGNOSIS — C541 Malignant neoplasm of endometrium: Secondary | ICD-10-CM | POA: Diagnosis not present

## 2022-03-08 DIAGNOSIS — K429 Umbilical hernia without obstruction or gangrene: Secondary | ICD-10-CM | POA: Diagnosis not present

## 2022-03-08 DIAGNOSIS — Z888 Allergy status to other drugs, medicaments and biological substances status: Secondary | ICD-10-CM | POA: Diagnosis not present

## 2022-03-08 DIAGNOSIS — E876 Hypokalemia: Secondary | ICD-10-CM

## 2022-03-08 LAB — CBC WITH DIFFERENTIAL (CANCER CENTER ONLY)
Abs Immature Granulocytes: 0.1 10*3/uL — ABNORMAL HIGH (ref 0.00–0.07)
Basophils Absolute: 0 10*3/uL (ref 0.0–0.1)
Basophils Relative: 0 %
Eosinophils Absolute: 0 10*3/uL (ref 0.0–0.5)
Eosinophils Relative: 0 %
HCT: 26.5 % — ABNORMAL LOW (ref 36.0–46.0)
Hemoglobin: 8.7 g/dL — ABNORMAL LOW (ref 12.0–15.0)
Immature Granulocytes: 1 %
Lymphocytes Relative: 14 %
Lymphs Abs: 1.4 10*3/uL (ref 0.7–4.0)
MCH: 25.2 pg — ABNORMAL LOW (ref 26.0–34.0)
MCHC: 32.8 g/dL (ref 30.0–36.0)
MCV: 76.8 fL — ABNORMAL LOW (ref 80.0–100.0)
Monocytes Absolute: 0.3 10*3/uL (ref 0.1–1.0)
Monocytes Relative: 3 %
Neutro Abs: 7.9 10*3/uL — ABNORMAL HIGH (ref 1.7–7.7)
Neutrophils Relative %: 82 %
Platelet Count: 225 10*3/uL (ref 150–400)
RBC: 3.45 MIL/uL — ABNORMAL LOW (ref 3.87–5.11)
RDW: 22.6 % — ABNORMAL HIGH (ref 11.5–15.5)
WBC Count: 9.6 10*3/uL (ref 4.0–10.5)
nRBC: 0 % (ref 0.0–0.2)

## 2022-03-08 LAB — CMP (CANCER CENTER ONLY)
ALT: 9 U/L (ref 0–44)
AST: 11 U/L — ABNORMAL LOW (ref 15–41)
Albumin: 4 g/dL (ref 3.5–5.0)
Alkaline Phosphatase: 107 U/L (ref 38–126)
Anion gap: 11 (ref 5–15)
BUN: 16 mg/dL (ref 8–23)
CO2: 25 mmol/L (ref 22–32)
Calcium: 8.9 mg/dL (ref 8.9–10.3)
Chloride: 104 mmol/L (ref 98–111)
Creatinine: 0.92 mg/dL (ref 0.44–1.00)
GFR, Estimated: >60 mL/min (ref >60)
Glucose, Bld: 217 mg/dL — ABNORMAL HIGH (ref 70–99)
Potassium: 3.5 mmol/L (ref 3.5–5.1)
Sodium: 140 mmol/L (ref 135–145)
Total Bilirubin: 0.3 mg/dL (ref 0.3–1.2)
Total Protein: 7.3 g/dL (ref 6.5–8.1)

## 2022-03-08 LAB — SAMPLE TO BLOOD BANK

## 2022-03-08 LAB — MAGNESIUM: Magnesium: 1.1 mg/dL — ABNORMAL LOW (ref 1.7–2.4)

## 2022-03-08 MED ORDER — SODIUM CHLORIDE 0.9 % IV SOLN
175.0000 mg/m2 | Freq: Once | INTRAVENOUS | Status: AC
  Administered 2022-03-08: 330 mg via INTRAVENOUS
  Filled 2022-03-08: qty 55

## 2022-03-08 MED ORDER — SODIUM CHLORIDE 0.9 % IV SOLN: Freq: Once | INTRAVENOUS | Status: AC

## 2022-03-08 MED ORDER — SODIUM CHLORIDE 0.9 % IV SOLN
150.0000 mg | Freq: Once | INTRAVENOUS | Status: AC
  Administered 2022-03-08: 150 mg via INTRAVENOUS
  Filled 2022-03-08: qty 150

## 2022-03-08 MED ORDER — DIPHENHYDRAMINE HCL 50 MG/ML IJ SOLN
25.0000 mg | Freq: Once | INTRAMUSCULAR | Status: AC
  Administered 2022-03-08: 25 mg via INTRAVENOUS
  Filled 2022-03-08: qty 1

## 2022-03-08 MED ORDER — SODIUM CHLORIDE 0.9% FLUSH
10.0000 mL | INTRAVENOUS | Status: DC | PRN
  Administered 2022-03-08: 10 mL

## 2022-03-08 MED ORDER — SODIUM CHLORIDE 0.9% FLUSH
10.0000 mL | Freq: Once | INTRAVENOUS | Status: AC
  Administered 2022-03-08: 10 mL

## 2022-03-08 MED ORDER — SODIUM CHLORIDE 0.9 % IV SOLN
463.5000 mg | Freq: Once | INTRAVENOUS | Status: AC
  Administered 2022-03-08: 460 mg via INTRAVENOUS
  Filled 2022-03-08: qty 46

## 2022-03-08 MED ORDER — DEXAMETHASONE 4 MG PO TABS
ORAL_TABLET | ORAL | 6 refills | Status: DC
  Filled 2022-03-08: qty 16, 84d supply, fill #0
  Filled 2022-03-20: qty 16, 84d supply, fill #1

## 2022-03-08 MED ORDER — SODIUM CHLORIDE 0.9 % IV SOLN
10.0000 mg | Freq: Once | INTRAVENOUS | Status: AC
  Administered 2022-03-08: 10 mg via INTRAVENOUS
  Filled 2022-03-08: qty 10

## 2022-03-08 MED ORDER — FAMOTIDINE IN NACL 20-0.9 MG/50ML-% IV SOLN
20.0000 mg | Freq: Once | INTRAVENOUS | Status: AC
  Administered 2022-03-08: 20 mg via INTRAVENOUS
  Filled 2022-03-08: qty 50

## 2022-03-08 MED ORDER — MAGNESIUM SULFATE 2 GM/50ML IV SOLN
2.0000 g | Freq: Once | INTRAVENOUS | Status: AC
  Administered 2022-03-08: 2 g via INTRAVENOUS
  Filled 2022-03-08: qty 50

## 2022-03-08 MED ORDER — MAGNESIUM OXIDE -MG SUPPLEMENT 400 (240 MG) MG PO TABS
400.0000 mg | ORAL_TABLET | Freq: Every day | ORAL | 1 refills | Status: DC
  Filled 2022-03-08: qty 30, 30d supply, fill #0

## 2022-03-08 MED ORDER — PALONOSETRON HCL INJECTION 0.25 MG/5ML
0.2500 mg | Freq: Once | INTRAVENOUS | Status: AC
  Administered 2022-03-08: 0.25 mg via INTRAVENOUS
  Filled 2022-03-08: qty 5

## 2022-03-08 MED ORDER — HEPARIN SOD (PORK) LOCK FLUSH 100 UNIT/ML IV SOLN
500.0000 [IU] | Freq: Once | INTRAVENOUS | Status: AC | PRN
  Administered 2022-03-08: 500 [IU]

## 2022-03-08 NOTE — Assessment & Plan Note (Signed)
She has intermittent elevated blood sugar ?We discussed importance of dietary modification while on treatment ?

## 2022-03-08 NOTE — Assessment & Plan Note (Signed)
Clinically, she appears to be responding to treatment ?She has no signs of significant fluid retention/ascites ?I suspect her recent weight loss is due to resolution of ascites ?I recommend we proceed with chemotherapy as scheduled today and I plan to repeat CT imaging in 2 weeks for further follow-up ?

## 2022-03-08 NOTE — Patient Instructions (Addendum)
[facility]  Discharge Instructions: ?Thank you for choosing [facility] to provide your oncology and hematology care.  ? ?If you have a lab appointment with the [facility], please go directly to the [facility] and check in at the registration area. ?  ?Wear comfortable clothing and clothing appropriate for easy access to any Portacath or PICC line.  ? ?We strive to give you quality time with your provider. You may need to reschedule your appointment if you arrive late (15 or more minutes).  Arriving late affects you and other patients whose appointments are after yours.  Also, if you miss three or more appointments without notifying the office, you may be dismissed from the clinic at the provider?s discretion.    ?  ?For prescription refill requests, have your pharmacy contact our office and allow 72 hours for refills to be completed.   ? ?Today you received the following chemotherapy and/or immunotherapy agents: Paclitaxel and Carboplatin    ?  ?To help prevent nausea and vomiting after your treatment, we encourage you to take your nausea medication as directed. ? ?BELOW ARE SYMPTOMS THAT SHOULD BE REPORTED IMMEDIATELY: ?*FEVER GREATER THAN 100.4 F (38 ?C) OR HIGHER ?*CHILLS OR SWEATING ?*NAUSEA AND VOMITING THAT IS NOT CONTROLLED WITH YOUR NAUSEA MEDICATION ?*UNUSUAL SHORTNESS OF BREATH ?*UNUSUAL BRUISING OR BLEEDING ?*URINARY PROBLEMS (pain or burning when urinating, or frequent urination) ?*BOWEL PROBLEMS (unusual diarrhea, constipation, pain near the anus) ?TENDERNESS IN MOUTH AND THROAT WITH OR WITHOUT PRESENCE OF ULCERS (sore throat, sores in mouth, or a toothache) ?UNUSUAL RASH, SWELLING OR PAIN  ?UNUSUAL VAGINAL DISCHARGE OR ITCHING  ? ?Items with * indicate a potential emergency and should be followed up as soon as possible or go to the Emergency Department if any problems should occur. ? ?Please show the CHEMOTHERAPY ALERT CARD or IMMUNOTHERAPY ALERT  CARD at check-in to the Emergency Department and triage nurse. ? ?Should you have questions after your visit or need to cancel or reschedule your appointment, please contact [facility]  Dept: [phone]  and follow the prompts.  Office hours are 8:00 a.m. to 4:30 p.m. Monday - Friday. Please note that voicemails left after 4:00 p.m. may not be returned until the following business day.  We are closed weekends and major holidays. You have access to a nurse at all times for urgent questions. Please call the main number to the clinic Dept: [phone] and follow the prompts. ? ? ?For any non-urgent questions, you may also contact your provider using MyChart. We now offer e-Visits for anyone 69 and older to request care online for non-urgent symptoms. For details visit [email or web address]. ?  ?Also download the MyChart app! Go to the app store, search "MyChart", open the app, select [facility], and log in with your MyChart username and password. ? ?Due to Covid, a mask is required upon entering the hospital/clinic. If you do not have a mask, one will be given to you upon arrival. For doctor visits, patients may have 1 support person aged 69 or older with them. For treatment visits, patients cannot have anyone with them due to current Covid guidelines and our immunocompromised population.  ? ?Magnesium Sulfate Injection ?What is this medication? ?MAGNESIUM SULFATE (mag NEE zee um SUL fate) prevents and treats low levels of magnesium in your body. It may also be used to prevent and treat seizures during pregnancy in people with high blood pressure disorders, such as preeclampsia  or eclampsia. Magnesium plays an important role in maintaining the health of your muscles and nervous system. ?This medicine may be used for other purposes; ask your health care provider or pharmacist if you have questions. ?What should I tell my care team before I take this medication? ?They need to know if  you have any of these conditions: ?Heart disease ?History of irregular heart beat ?Kidney disease ?An unusual or allergic reaction to magnesium sulfate, medications, foods, dyes, or preservatives ?Pregnant or trying to get pregnant ?Breast-feeding ?How should I use this medication? ?This medication is for infusion into a vein. It is given in a hospital or clinic setting. ?Talk to your care team about the use of this medication in children. While this medication may be prescribed for selected conditions, precautions do apply. ?Overdosage: If you think you have taken too much of this medicine contact a poison control center or emergency room at once. ?NOTE: This medicine is only for you. Do not share this medicine with others. ?What if I miss a dose? ?This does not apply. ?What may interact with this medication? ?Certain medications for anxiety or sleep ?Certain medications for seizures like phenobarbital ?Digoxin ?Medications that relax muscles for surgery ?Narcotic medications for pain ?This list may not describe all possible interactions. Give your health care provider a list of all the medicines, herbs, non-prescription drugs, or dietary supplements you use. Also tell them if you smoke, drink alcohol, or use illegal drugs. Some items may interact with your medicine. ?What should I watch for while using this medication? ?Your condition will be monitored carefully while you are receiving this medication. You may need blood work done while you are receiving this medication. ?What side effects may I notice from receiving this medication? ?Side effects that you should report to your care team as soon as possible: ?Allergic reactions--skin rash, itching, hives, swelling of the face, lips, tongue, or throat ?High magnesium level--confusion, drowsiness, facial flushing, redness, sweating, muscle weakness, fast or irregular heartbeat, trouble breathing ?Low blood pressure--dizziness, feeling faint or lightheaded, blurry  vision ?Side effects that usually do not require medical attention (report to your care team if they continue or are bothersome): ?Headache ?Nausea ?This list may not describe all possible side effects. Call your doctor for medical advice about side effects. You may report side effects to FDA at 1-800-FDA-1088. ?Where should I keep my medication? ?This medication is given in a hospital or clinic and will not be stored at home. ?NOTE: This sheet is a summary. It may not cover all possible information. If you have questions about this medicine, talk to your doctor, pharmacist, or health care provider. ?? 2023 Elsevier/Gold Standard (2020-12-28 00:00:00) ? ?

## 2022-03-08 NOTE — Progress Notes (Signed)
IV mag added to treatment today per Dr Alvy Bimler, no venofer needed today, will give at next infusion appt per Dr Alvy Bimler.  ?

## 2022-03-08 NOTE — Assessment & Plan Note (Signed)
She has received numerous doses of intravenous iron infusion with improvement of her blood count ?I plan to recheck iron studies again next visit and will give her more IV iron if needed ?

## 2022-03-08 NOTE — Assessment & Plan Note (Signed)
She has severe hypomagnesemia ?I recommend IV as well as oral magnesium replacement therapy ?

## 2022-03-08 NOTE — Progress Notes (Signed)
[facility] ?OFFICE PROGRESS NOTE ? ?Patient Care Team: ?[name], MD as PCP - General (Internal Medicine) ?[name], RN as [place] Management ? ?ASSESSMENT & PLAN:  ?Uterine cancer ([place]) ?Clinically, she appears to be responding to treatment ?She has no signs of significant fluid retention/ascites ?I suspect her recent weight loss is due to resolution of ascites ?I recommend we proceed with chemotherapy as scheduled today and I plan to repeat CT imaging in 2 weeks for further follow-up ? ?Iron deficiency anemia ?She has received numerous doses of intravenous iron infusion with improvement of her blood count ?I plan to recheck iron studies again next visit and will give her more IV iron if needed ? ?Hypomagnesemia ?She has severe hypomagnesemia ?I recommend IV as well as oral magnesium replacement therapy ? ?Type 2 diabetes mellitus with neurological complications ([place]) ?She has intermittent elevated blood sugar ?We discussed importance of dietary modification while on treatment ? ?Orders Placed This Encounter  ?Procedures  ? CT ABDOMEN PELVIS W CONTRAST  ?  Standing Status:   Future  ?  Standing Expiration Date:   03/09/2023  ?  Order Specific Question:   If indicated for the ordered procedure, I authorize the administration of contrast media per Radiology protocol  ?  Answer:   Yes  ?  Order Specific Question:   Preferred imaging location?  ?  Answer:   [facility]  ?  Order Specific Question:   Radiology Contrast Protocol - do NOT remove file path  ?  Answer:   [email or web address]  ? Magnesium  ?  Standing Status:   Standing  ?  Number of Occurrences:   9  ?  Standing Expiration Date:   03/09/2023  ? Iron and Iron Binding Capacity ([facility] only)  ?  Standing Status:   Future  ?  Standing Expiration Date:   03/09/2023  ? Ferritin  ?  Standing Status:   Future  ?  Standing Expiration Date:   03/08/2023  ? ? ?All  questions were answered. The patient knows to call the clinic with any problems, questions or concerns. ?The total time spent in the appointment was 30 minutes encounter with patients including review of chart and various tests results, discussions about plan of care and coordination of care plan ?  ?[name], MD ?03/08/2022 12:01 PM ? ?INTERVAL HISTORY: ?Please see below for problem oriented charting. ?she returns for treatment follow-up seen prior to cycle 3 of chemotherapy ?She is doing well ?Denies peripheral neuropathy from treatment ?No recent nausea or changes in bowel habits ?Her abdominal distention continue to improve ? ?REVIEW OF SYSTEMS:   ?Constitutional: Denies fevers, chills or abnormal weight loss ?Eyes: Denies blurriness of vision ?Ears, nose, mouth, throat, and face: Denies mucositis or sore throat ?Respiratory: Denies cough, dyspnea or wheezes ?Cardiovascular: Denies palpitation, chest discomfort or lower extremity swelling ?Gastrointestinal:  Denies nausea, heartburn or change in bowel habits ?Skin: Denies abnormal skin rashes ?Lymphatics: Denies new lymphadenopathy or easy bruising ?Neurological:Denies numbness, tingling or new weaknesses ?Behavioral/Psych: Mood is stable, no new changes  ?All other systems were reviewed with the patient and are negative. ? ?I have reviewed the past medical history, past surgical history, social history and family history with the patient and they are unchanged from previous note. ? ?ALLERGIES:  is allergic to codeine sulfate, penicillins, percocet [oxycodone-acetaminophen], codeine, and lisinopril. ? ?MEDICATIONS:  ?Current Outpatient Medications  ?Medication Sig Dispense Refill  ? magnesium oxide (MAG-OX) 400 (  240 Mg) MG tablet Take 1 tablet (400 mg total) by mouth daily. 30 tablet 1  ? acetaminophen (TYLENOL) 500 MG tablet Take 1,000 mg by mouth every 6 (six) hours as needed for mild pain.    ? albuterol (VENTOLIN HFA) 108 (90 Base) MCG/ACT inhaler Inhale 2  puffs into the lungs every 4 (four) hours as needed for wheezing or shortness of breath. 8 g 0  ? amLODipine (NORVASC) 10 MG tablet Take 1 tablet (10 mg total) by mouth daily. (Patient taking differently: Take 10 mg by mouth at bedtime.) 90 tablet 2  ? atorvastatin (LIPITOR) 20 MG tablet Take 1 tablet (20 mg total) by mouth daily. (Patient taking differently: Take 20 mg by mouth at bedtime.) 90 tablet 3  ? Blood Glucose Monitoring Suppl (ONE TOUCH ULTRA MINI) w/Device KIT Please use as directed. 1 each 0  ? Cholecalciferol 1000 units capsule Take 1 capsule (1,000 Units total) by mouth daily. 90 capsule 1  ? dexamethasone (DECADRON) 4 MG tablet Take 2 tablets by mouth the night before and 2 tablets the morning of chemotherapy, every 3 weeks for 6 cycles 36 tablet 6  ? dorzolamide-timolol (COSOPT) 22.3-6.8 MG/ML ophthalmic solution Place 1 drop into both eyes 2 (two) times daily.    ? glucose blood (ONE TOUCH ULTRA TEST) test strip Use as instructed 100 each 1  ? Insulin Pen Needle 32G X 4 MM MISC 1 each by Does not apply route daily. 100 each 3  ? latanoprost (XALATAN) 0.005 % ophthalmic solution Place 1 drop into both eyes at bedtime.    ? lidocaine-prilocaine (EMLA) cream Apply to affected area once 30 g 3  ? liraglutide (VICTOZA) 18 MG/3ML SOPN PLEASE START TAKING VICTOZA 0.6MG DAILY WEEK 1, THEN TAKE 1.2MG DAILY WEEK 2, AND THEN TAKE 1.8MG DAILY THEREAFTER (Patient taking differently: Inject 1.2 mg into the skin daily.) 9 mL 3  ? losartan (COZAAR) 100 MG tablet Take 100 mg by mouth daily.    ? ondansetron (ZOFRAN) 8 MG tablet Take 1 tablet (8 mg total) by mouth 2 (two) times daily as needed. Start on the third day after chemotherapy. 30 tablet 1  ? ONETOUCH DELICA LANCETS 93Y MISC Please use as directed. 100 each 0  ? pantoprazole (PROTONIX) 40 MG tablet Take 1 tablet by mouth daily. 30 tablet 1  ? polyethylene glycol powder (GLYCOLAX/MIRALAX) 17 GM/SCOOP powder Take 17 g by mouth daily. 238 g 0  ?  prochlorperazine (COMPAZINE) 10 MG tablet Take 1 tablet (10 mg total) by mouth every 6 (six) hours as needed (Nausea or vomiting). 30 tablet 1  ? traMADol (ULTRAM) 50 MG tablet Take 2 tablets (100 mg total) by mouth every 6 (six) hours as needed for moderate pain. 60 tablet 0  ? ?No current facility-administered medications for this visit.  ? ?Facility-Administered Medications Ordered in Other Visits  ?Medication Dose Route Frequency Provider Last Rate Last Admin  ? CARBOplatin (PARAPLATIN) 460 mg in sodium chloride 0.9 % 250 mL chemo infusion  460 mg Intravenous Once Alvy Bimler, Sally-Ann Cutbirth, MD      ? famotidine (PEPCID) IVPB 20 mg premix  20 mg Intravenous Once Alvy Bimler, Yutaka Holberg, MD 200 mL/hr at 03/08/22 1149 20 mg at 03/08/22 1149  ? heparin lock flush 100 unit/mL  500 Units Intracatheter Once PRN [name], MD      ? magnesium sulfate IVPB 2 g 50 mL  2 g Intravenous Once [name], MD 50 mL/hr at 03/08/22 1157 2 g at 03/08/22  1157  ? PACLitaxel (TAXOL) 330 mg in sodium chloride 0.9 % 500 mL chemo infusion (> 60m/m2)  175 mg/m2 (Treatment Plan Recorded) Intravenous Once GAlvy Bimler Perrie Ragin, MD      ? sodium chloride flush (NS) 0.9 % injection 10 mL  10 mL Intracatheter PRN GHeath Lark MD      ? ? ?SUMMARY OF ONCOLOGIC HISTORY: ?Oncology History Overview Note  ?High grade serous, Her2/neu neg, MMR normal, MSI stable ?  ?[place]  ?10/12/2021 Imaging  ? UK[place]apelvis ? ?1. Heterogeneous endometrial thickening with appearance of moderate complex fluid in the endometrial canal, possible hemorrhagic material. Endometrial thickness is considered abnormal for an asymptomatic post-menopausal female. Endometrial sampling should be considered to exclude carcinoma. ?2. Nonvisualized ovary ?3. Heterogeneous thick-walled urinary bladder, correlate for cystitis. ?  ?12/28/2021 Pathology Results  ? CYTOLOGY - NON PAP  ?CASE: [id number]  ?PATIENT: Sherri Gill  ?Non-Gynecological Cytology Report  ? ?Clinical History: Abnormal pelvic UKorea  highly suspicious of malignant ascites  ?Specimen Submitted:  A. ASCITES, PARACENTESIS:  ? ? ?FINAL MICROSCOPIC DIAGNOSIS:  ?- Malignant cells consistent with adenocarcinoma  ?- See comment  ? ?SPECIMEN ADEQUACY:  ?Sat

## 2022-03-11 ENCOUNTER — Telehealth: Payer: Self-pay | Admitting: Oncology

## 2022-03-11 ENCOUNTER — Telehealth: Payer: Self-pay | Admitting: Hematology and Oncology

## 2022-03-11 NOTE — Telephone Encounter (Signed)
Called Charlesa back and let her know to continue taking Tylenol as needed for the leg cramps/aching.  She verbalized understanding and agreement. ?

## 2022-03-11 NOTE — Telephone Encounter (Signed)
Scheduled appointment per 5/12 los. Patient is aware. ?

## 2022-03-11 NOTE — Telephone Encounter (Signed)
No additional recommendation ?

## 2022-03-11 NOTE — Telephone Encounter (Signed)
[facility] and went over upcoming appointments and CT scan instructions for 03/19/22.  She verbalized understanding and mentioned that her legs have been cramping/aching since last night.  This happened after her last chemotherapy and eventually went away after a couple of days.  She did take a tramadol last night and is going to try Tylenol this morning.  Advised her that I will let Dr. Alvy Bimler know to see if she has any recommendations.   ?

## 2022-03-13 ENCOUNTER — Telehealth: Payer: Self-pay

## 2022-03-13 ENCOUNTER — Other Ambulatory Visit: Payer: Self-pay | Admitting: Internal Medicine

## 2022-03-13 DIAGNOSIS — I10 Essential (primary) hypertension: Secondary | ICD-10-CM

## 2022-03-13 NOTE — Telephone Encounter (Signed)
Pt called and LVM which was unclear concerning most recent treatment and mentioned leg pain. Attempted to return pt's call, no answer, no VM to leave a message on.  ?

## 2022-03-14 ENCOUNTER — Other Ambulatory Visit: Payer: Self-pay | Admitting: *Deleted

## 2022-03-14 ENCOUNTER — Telehealth: Payer: Self-pay

## 2022-03-14 ENCOUNTER — Other Ambulatory Visit: Payer: Self-pay

## 2022-03-14 DIAGNOSIS — E236 Other disorders of pituitary gland: Secondary | ICD-10-CM | POA: Diagnosis not present

## 2022-03-14 DIAGNOSIS — K429 Umbilical hernia without obstruction or gangrene: Secondary | ICD-10-CM | POA: Diagnosis not present

## 2022-03-14 DIAGNOSIS — Z86010 Personal history of colonic polyps: Secondary | ICD-10-CM | POA: Diagnosis not present

## 2022-03-14 DIAGNOSIS — G4700 Insomnia, unspecified: Secondary | ICD-10-CM | POA: Diagnosis not present

## 2022-03-14 DIAGNOSIS — E871 Hypo-osmolality and hyponatremia: Secondary | ICD-10-CM | POA: Diagnosis not present

## 2022-03-14 DIAGNOSIS — Z9851 Tubal ligation status: Secondary | ICD-10-CM | POA: Diagnosis not present

## 2022-03-14 DIAGNOSIS — Z87891 Personal history of nicotine dependence: Secondary | ICD-10-CM | POA: Diagnosis not present

## 2022-03-14 DIAGNOSIS — Z6833 Body mass index (BMI) 33.0-33.9, adult: Secondary | ICD-10-CM | POA: Diagnosis not present

## 2022-03-14 DIAGNOSIS — N179 Acute kidney failure, unspecified: Secondary | ICD-10-CM | POA: Diagnosis not present

## 2022-03-14 DIAGNOSIS — R69 Illness, unspecified: Secondary | ICD-10-CM | POA: Diagnosis not present

## 2022-03-14 DIAGNOSIS — Z7952 Long term (current) use of systemic steroids: Secondary | ICD-10-CM | POA: Diagnosis not present

## 2022-03-14 DIAGNOSIS — Z79899 Other long term (current) drug therapy: Secondary | ICD-10-CM | POA: Diagnosis not present

## 2022-03-14 DIAGNOSIS — D72829 Elevated white blood cell count, unspecified: Secondary | ICD-10-CM | POA: Diagnosis not present

## 2022-03-14 DIAGNOSIS — E1142 Type 2 diabetes mellitus with diabetic polyneuropathy: Secondary | ICD-10-CM | POA: Diagnosis not present

## 2022-03-14 DIAGNOSIS — C55 Malignant neoplasm of uterus, part unspecified: Secondary | ICD-10-CM

## 2022-03-14 DIAGNOSIS — K219 Gastro-esophageal reflux disease without esophagitis: Secondary | ICD-10-CM | POA: Diagnosis not present

## 2022-03-14 DIAGNOSIS — E669 Obesity, unspecified: Secondary | ICD-10-CM | POA: Diagnosis not present

## 2022-03-14 DIAGNOSIS — E785 Hyperlipidemia, unspecified: Secondary | ICD-10-CM | POA: Diagnosis not present

## 2022-03-14 DIAGNOSIS — K5730 Diverticulosis of large intestine without perforation or abscess without bleeding: Secondary | ICD-10-CM | POA: Diagnosis not present

## 2022-03-14 DIAGNOSIS — D509 Iron deficiency anemia, unspecified: Secondary | ICD-10-CM | POA: Diagnosis not present

## 2022-03-14 DIAGNOSIS — Z9981 Dependence on supplemental oxygen: Secondary | ICD-10-CM | POA: Diagnosis not present

## 2022-03-14 DIAGNOSIS — E1165 Type 2 diabetes mellitus with hyperglycemia: Secondary | ICD-10-CM | POA: Diagnosis not present

## 2022-03-14 DIAGNOSIS — I10 Essential (primary) hypertension: Secondary | ICD-10-CM | POA: Diagnosis not present

## 2022-03-14 DIAGNOSIS — R188 Other ascites: Secondary | ICD-10-CM | POA: Diagnosis not present

## 2022-03-14 MED ORDER — ATORVASTATIN CALCIUM 20 MG PO TABS: 20.0000 mg | ORAL_TABLET | Freq: Every evening | ORAL | 11 refills | Status: DC

## 2022-03-14 NOTE — Telephone Encounter (Signed)
Returned her call. She has been feeling down after treatment and diagnoses. Sent a referral to social worker per her request. She started having leg pain on Monday. The pain has gotten much better and is tolerable now. When she was having having the pain she gets down and thinks about everything. 2-3 days after treatment she starts having upper abdominal soreness. She is taking Protonix daily and Tums prn. The soreness is better today. She just wanted Dr. Alvy Bimler to be aware. Ask her to call the office back for questions or concerns. She verbalized understanding.

## 2022-03-14 NOTE — Telephone Encounter (Signed)
Can you try to call her today?

## 2022-03-14 NOTE — Telephone Encounter (Signed)
Called her regarding message yesterday. She started having leg pain on Monday and was taking tylenol for the pain. The tylenol did not help her leg pain and she was going to ask for something stronger. Today the pain is better and she getting PT now. She appreciated to call back and will call the office back if needed.

## 2022-03-15 ENCOUNTER — Encounter: Payer: Self-pay | Admitting: Licensed Clinical Social Worker

## 2022-03-15 NOTE — Progress Notes (Signed)
[place] Work  Initial Assessment   Sherri Gill is a 71 y.o. year old female contacted by phone. Clinical Social Work was referred by nurse for assessment of psychosocial needs.   SDOH (Social Determinants of Health) assessments performed: Yes SDOH Interventions    Flowsheet Row Most Recent Value  SDOH Interventions   Financial Strain Interventions Development worker, community, Other (Comment)  Camera operator foundations]  Housing Interventions Intervention Not Indicated  Stress Interventions Provide Counseling       SDOH Screenings   Alcohol Screen: Not on file  Depression (PHQ2-9): Low Risk    PHQ-2 Score: 0  Financial Resource Strain: Medium Risk   Difficulty of Paying Living Expenses: Somewhat hard  Food Insecurity: Not on file  Housing: [place] Risk Score: 0  Physical Activity: Not on file  Social Connections: Not on file  Stress: Stress Concern Present   Feeling of Stress : Very much  Tobacco Use: Medium Risk   Smoking Tobacco Use: Former   Smokeless Tobacco Use: Never   Passive Exposure: Not on file  Transportation Needs: Not on file     Distress Screen completed: No     View : No data to display.            Family/Social Information:  Housing Arrangement: patient lives with her son . A lot of her other family is in [place] and other areas of [place] Family members/support persons in your life? Family (daughter, cousins), Friends, and Music therapist concerns: yes, has friends who help with rides  Employment: Retired was working part-time up until cancer dx.  Income source: Paediatric nurse concerns: Yes, due to illness and/or loss of work during treatment Type of concern: Medical bills Food access concerns: no Religious or spiritual practice: Yes-has faith and connected with faith community Services Currently in place:  counselor through PCP  Coping/ Adjustment to diagnosis: Patient understands treatment  plan and what happens next? yes, is anxious about upcoming scan on Tuesday and what it might show Concerns about diagnosis and/or treatment: Body image How I will pay for the services I need Feelings of anxiety and sadness Patient reported stressors: Finances, Depression, Anxiety/ nervousness, and Facing my mortality Hopes and/or priorities: wants to be able to find positivity to help through the rest of treatment Patient enjoys time with family/ friends Current coping skills/ strengths: Ability for insight , Capable of independent living , Motivation for treatment/growth , Religious Affiliation , and Supportive family/friends     SUMMARY: Current SDOH Barriers:  Financial constraints related to treatment costs and Mental Health Concerns   Clinical Social Work Clinical Goal(s):  Demonstrate a reduction in symptoms related to :Anxiety  Pt will work with CSW to address needs related to financial constraints  Interventions: Discussed & normalized common feeling and emotions when being diagnosed with cancer, and the importance of support during treatment Informed patient of the support team roles and support services at [facility] Provided CSW contact information and encouraged patient to call with any questions or concerns Referred patient to [facility] and signed up for GYN support group   Follow Up Plan: CSW will see patient on 5/25  in infusion. Pt may call as needed prior to visit Patient verbalizes understanding of plan: Yes    Makaio Mach E Sherri Warnick, LCSW

## 2022-03-18 ENCOUNTER — Encounter: Payer: Self-pay | Admitting: Hematology and Oncology

## 2022-03-18 ENCOUNTER — Telehealth: Payer: Self-pay

## 2022-03-18 NOTE — Telephone Encounter (Signed)
Called regarding after hours call on 5/19 for complaint of severe abdominal pain. She was told on 5/19 to go to the ER for severe abdominal pain. She did not go to the ED for the pain. The abdominal did get a little better so she did not go. She is still having upper abdominal pain, the pain is constant and at this time it is a 4 out 10. She is taking Protonix daily and sometimes a Tums prn. She said prior to cancer dx, she was told she had a umbilical hernia. She is concerned that this may be causing the pain. She is able to eat and drink.

## 2022-03-18 NOTE — Telephone Encounter (Signed)
I suggest she takes laxatives regularly in case there is an element of constipation Keep her appt as is for CT tomorrow

## 2022-03-18 NOTE — Telephone Encounter (Signed)
Called and given below message. She verbalized understanding and will continue taking laxatives until appt with Dr. Alvy Bimler. Ask her to call the office back if needed. She verbalized understanding.

## 2022-03-19 ENCOUNTER — Inpatient Hospital Stay: Payer: Medicare HMO

## 2022-03-19 ENCOUNTER — Encounter: Payer: Self-pay | Admitting: Gynecologic Oncology

## 2022-03-19 ENCOUNTER — Other Ambulatory Visit: Payer: Self-pay | Admitting: Hematology and Oncology

## 2022-03-19 ENCOUNTER — Ambulatory Visit (HOSPITAL_COMMUNITY)
Admission: RE | Admit: 2022-03-19 | Discharge: 2022-03-19 | Disposition: A | Discharge status: Home / Self Care | Payer: Medicare HMO | Source: Ambulatory Visit | Attending: Hematology and Oncology | Admitting: Hematology and Oncology

## 2022-03-19 DIAGNOSIS — Z794 Long term (current) use of insulin: Secondary | ICD-10-CM | POA: Diagnosis not present

## 2022-03-19 DIAGNOSIS — E1149 Type 2 diabetes mellitus with other diabetic neurological complication: Secondary | ICD-10-CM | POA: Diagnosis not present

## 2022-03-19 DIAGNOSIS — I700 Atherosclerosis of aorta: Secondary | ICD-10-CM | POA: Diagnosis not present

## 2022-03-19 DIAGNOSIS — R188 Other ascites: Secondary | ICD-10-CM | POA: Diagnosis not present

## 2022-03-19 DIAGNOSIS — C55 Malignant neoplasm of uterus, part unspecified: Secondary | ICD-10-CM

## 2022-03-19 DIAGNOSIS — Z5111 Encounter for antineoplastic chemotherapy: Secondary | ICD-10-CM | POA: Diagnosis not present

## 2022-03-19 DIAGNOSIS — C541 Malignant neoplasm of endometrium: Secondary | ICD-10-CM | POA: Diagnosis not present

## 2022-03-19 DIAGNOSIS — D509 Iron deficiency anemia, unspecified: Secondary | ICD-10-CM | POA: Diagnosis not present

## 2022-03-19 DIAGNOSIS — K5730 Diverticulosis of large intestine without perforation or abscess without bleeding: Secondary | ICD-10-CM | POA: Diagnosis not present

## 2022-03-19 DIAGNOSIS — Z885 Allergy status to narcotic agent status: Secondary | ICD-10-CM | POA: Diagnosis not present

## 2022-03-19 DIAGNOSIS — K429 Umbilical hernia without obstruction or gangrene: Secondary | ICD-10-CM | POA: Diagnosis not present

## 2022-03-19 DIAGNOSIS — Z79899 Other long term (current) drug therapy: Secondary | ICD-10-CM | POA: Diagnosis not present

## 2022-03-19 DIAGNOSIS — Z888 Allergy status to other drugs, medicaments and biological substances status: Secondary | ICD-10-CM | POA: Diagnosis not present

## 2022-03-19 DIAGNOSIS — N3289 Other specified disorders of bladder: Secondary | ICD-10-CM | POA: Diagnosis not present

## 2022-03-19 DIAGNOSIS — Z7952 Long term (current) use of systemic steroids: Secondary | ICD-10-CM | POA: Diagnosis not present

## 2022-03-19 DIAGNOSIS — Z880 Allergy status to penicillin: Secondary | ICD-10-CM | POA: Diagnosis not present

## 2022-03-19 LAB — CBC WITH DIFFERENTIAL (CANCER CENTER ONLY)
Abs Immature Granulocytes: 0 10*3/uL (ref 0.00–0.07)
Basophils Absolute: 0 10*3/uL (ref 0.0–0.1)
Basophils Relative: 0 %
Eosinophils Absolute: 0 10*3/uL (ref 0.0–0.5)
Eosinophils Relative: 2 %
HCT: 25 % — ABNORMAL LOW (ref 36.0–46.0)
Hemoglobin: 8.1 g/dL — ABNORMAL LOW (ref 12.0–15.0)
Immature Granulocytes: 0 %
Lymphocytes Relative: 57 %
Lymphs Abs: 1.6 10*3/uL (ref 0.7–4.0)
MCH: 25.6 pg — ABNORMAL LOW (ref 26.0–34.0)
MCHC: 32.4 g/dL (ref 30.0–36.0)
MCV: 78.9 fL — ABNORMAL LOW (ref 80.0–100.0)
Monocytes Absolute: 0.4 10*3/uL (ref 0.1–1.0)
Monocytes Relative: 14 %
Neutro Abs: 0.8 10*3/uL — ABNORMAL LOW (ref 1.7–7.7)
Neutrophils Relative %: 27 %
Platelet Count: 280 10*3/uL (ref 150–400)
RBC: 3.17 MIL/uL — ABNORMAL LOW (ref 3.87–5.11)
RDW: 22.3 % — ABNORMAL HIGH (ref 11.5–15.5)
Smear Review: NORMAL
WBC Count: 2.7 10*3/uL — ABNORMAL LOW (ref 4.0–10.5)
nRBC: 0 % (ref 0.0–0.2)

## 2022-03-19 LAB — CMP (CANCER CENTER ONLY)
ALT: 8 U/L (ref 0–44)
AST: 11 U/L — ABNORMAL LOW (ref 15–41)
Albumin: 4.1 g/dL (ref 3.5–5.0)
Alkaline Phosphatase: 124 U/L (ref 38–126)
Anion gap: 9 (ref 5–15)
BUN: 13 mg/dL (ref 8–23)
CO2: 28 mmol/L (ref 22–32)
Calcium: 9.4 mg/dL (ref 8.9–10.3)
Chloride: 105 mmol/L (ref 98–111)
Creatinine: 0.76 mg/dL (ref 0.44–1.00)
GFR, Estimated: >60 mL/min (ref >60)
Glucose, Bld: 129 mg/dL — ABNORMAL HIGH (ref 70–99)
Potassium: 3.3 mmol/L — ABNORMAL LOW (ref 3.5–5.1)
Sodium: 142 mmol/L (ref 135–145)
Total Bilirubin: 0.2 mg/dL — ABNORMAL LOW (ref 0.3–1.2)
Total Protein: 7.5 g/dL (ref 6.5–8.1)

## 2022-03-19 LAB — IRON AND IRON BINDING CAPACITY (CC-WL,HP ONLY)
Iron: 65 ug/dL (ref 28–170)
Saturation Ratios: 22 % (ref 10.4–31.8)
TIBC: 300 ug/dL (ref 250–450)
UIBC: 235 ug/dL (ref 148–442)

## 2022-03-19 LAB — SAMPLE TO BLOOD BANK

## 2022-03-19 LAB — FERRITIN: Ferritin: 552 ng/mL — ABNORMAL HIGH (ref 11–307)

## 2022-03-19 LAB — MAGNESIUM: Magnesium: 1.3 mg/dL — ABNORMAL LOW (ref 1.7–2.4)

## 2022-03-19 MED ORDER — HEPARIN SOD (PORK) LOCK FLUSH 100 UNIT/ML IV SOLN
INTRAVENOUS | Status: AC
  Filled 2022-03-19: qty 5

## 2022-03-19 MED ORDER — HEPARIN SOD (PORK) LOCK FLUSH 100 UNIT/ML IV SOLN
500.0000 [IU] | Freq: Once | INTRAVENOUS | Status: AC
Start: 2022-03-19 — End: 2022-03-19
  Administered 2022-03-19: 500 [IU] via INTRAVENOUS

## 2022-03-19 MED ORDER — IOHEXOL 300 MG/ML  SOLN
100.0000 mL | Freq: Once | INTRAMUSCULAR | Status: AC | PRN
  Administered 2022-03-19: 100 mL via INTRAVENOUS

## 2022-03-19 MED ORDER — SODIUM CHLORIDE 0.9% FLUSH
10.0000 mL | Freq: Once | INTRAVENOUS | Status: AC
  Administered 2022-03-19: 10 mL

## 2022-03-20 ENCOUNTER — Ambulatory Visit: Payer: Medicare HMO | Admitting: Behavioral Health

## 2022-03-20 ENCOUNTER — Other Ambulatory Visit (HOSPITAL_COMMUNITY): Payer: Self-pay

## 2022-03-20 DIAGNOSIS — N179 Acute kidney failure, unspecified: Secondary | ICD-10-CM | POA: Diagnosis not present

## 2022-03-20 DIAGNOSIS — D509 Iron deficiency anemia, unspecified: Secondary | ICD-10-CM | POA: Diagnosis not present

## 2022-03-20 DIAGNOSIS — Z9851 Tubal ligation status: Secondary | ICD-10-CM | POA: Diagnosis not present

## 2022-03-20 DIAGNOSIS — E236 Other disorders of pituitary gland: Secondary | ICD-10-CM | POA: Diagnosis not present

## 2022-03-20 DIAGNOSIS — E669 Obesity, unspecified: Secondary | ICD-10-CM | POA: Diagnosis not present

## 2022-03-20 DIAGNOSIS — K219 Gastro-esophageal reflux disease without esophagitis: Secondary | ICD-10-CM | POA: Diagnosis not present

## 2022-03-20 DIAGNOSIS — E871 Hypo-osmolality and hyponatremia: Secondary | ICD-10-CM | POA: Diagnosis not present

## 2022-03-20 DIAGNOSIS — K5730 Diverticulosis of large intestine without perforation or abscess without bleeding: Secondary | ICD-10-CM | POA: Diagnosis not present

## 2022-03-20 DIAGNOSIS — E785 Hyperlipidemia, unspecified: Secondary | ICD-10-CM | POA: Diagnosis not present

## 2022-03-20 DIAGNOSIS — R69 Illness, unspecified: Secondary | ICD-10-CM | POA: Diagnosis not present

## 2022-03-20 DIAGNOSIS — Z87891 Personal history of nicotine dependence: Secondary | ICD-10-CM | POA: Diagnosis not present

## 2022-03-20 DIAGNOSIS — Z86010 Personal history of colonic polyps: Secondary | ICD-10-CM | POA: Diagnosis not present

## 2022-03-20 DIAGNOSIS — Z6833 Body mass index (BMI) 33.0-33.9, adult: Secondary | ICD-10-CM | POA: Diagnosis not present

## 2022-03-20 DIAGNOSIS — R188 Other ascites: Secondary | ICD-10-CM | POA: Diagnosis not present

## 2022-03-20 DIAGNOSIS — I10 Essential (primary) hypertension: Secondary | ICD-10-CM | POA: Diagnosis not present

## 2022-03-20 DIAGNOSIS — F331 Major depressive disorder, recurrent, moderate: Secondary | ICD-10-CM

## 2022-03-20 DIAGNOSIS — Z7952 Long term (current) use of systemic steroids: Secondary | ICD-10-CM | POA: Diagnosis not present

## 2022-03-20 DIAGNOSIS — D72829 Elevated white blood cell count, unspecified: Secondary | ICD-10-CM | POA: Diagnosis not present

## 2022-03-20 DIAGNOSIS — G4700 Insomnia, unspecified: Secondary | ICD-10-CM | POA: Diagnosis not present

## 2022-03-20 DIAGNOSIS — E1165 Type 2 diabetes mellitus with hyperglycemia: Secondary | ICD-10-CM | POA: Diagnosis not present

## 2022-03-20 DIAGNOSIS — Z79899 Other long term (current) drug therapy: Secondary | ICD-10-CM | POA: Diagnosis not present

## 2022-03-20 DIAGNOSIS — E1142 Type 2 diabetes mellitus with diabetic polyneuropathy: Secondary | ICD-10-CM | POA: Diagnosis not present

## 2022-03-20 DIAGNOSIS — Z9981 Dependence on supplemental oxygen: Secondary | ICD-10-CM | POA: Diagnosis not present

## 2022-03-20 DIAGNOSIS — K429 Umbilical hernia without obstruction or gangrene: Secondary | ICD-10-CM | POA: Diagnosis not present

## 2022-03-20 DIAGNOSIS — F419 Anxiety disorder, unspecified: Secondary | ICD-10-CM

## 2022-03-20 NOTE — BH Specialist Note (Addendum)
Integrated Behavioral Health via Telemedicine Visit  03/20/2022 MARCO ADELSON [id number]  Number of [place] Clinician visits: 8 Session Start time: 1400 Session End time: 1430 Total time in minutes: 30 min  Referring Provider: Dr. Lenise Herald, MD Patient/Family location: Pt is home in private [facility] Provider location: [facility] All persons participating in visit: Pt & Clinician Types of Service: Individual psychotherapy  I connected with [name] and/or [place]  self  via  Telephone or Video Enabled Telemedicine Application  (Video is Caregility application) and verified that I am speaking with the correct person using two identifiers. Discussed confidentiality: Yes   I discussed the limitations of telemedicine and the availability of in person appointments.  Discussed there is a possibility of technology failure and discussed alternative modes of communication if that failure occurs.  I discussed that engaging in this telemedicine visit, they consent to the provision of behavioral healthcare and the services will be billed under their insurance.  Patient and/or legal guardian expressed understanding and consented to Telemedicine visit: Yes   Presenting Concerns: Patient and/or family reports the following symptoms/concerns: cont'd dec in anx/dep with situational inc due to upcoming Scan. Duration of problem: months; Severity of problem: moderate  Patient and/or Family's Strengths/Protective Factors: Social connections, Social and Emotional competence, Concrete supports in place (healthy food, safe environments, etc.), Sense of purpose, and Physical Health (exercise, healthy diet, medication compliance, etc.)  Pt is amazed how well her Son Jomarie Longs is doing @ [employer]; he does chores @ home now & has insight into Mom's needs in the home.  Goals Addressed: Patient will:  Reduce symptoms of: anxiety and depression   Increase knowledge  and/or ability of: coping skills and healthy habits   Demonstrate ability to: Increase healthy adjustment to current life circumstances  Progress towards Goals: Ongoing  Interventions: Interventions utilized:  Supportive Counseling Standardized Assessments completed:  screeners prn  Patient and/or Family Response: Pt receptive to visit today & [facility]  Assessment: Patient currently experiencing situational anx/dep due to her first exp w/Scan protocol. She is already assisting other cancer Patients who she encounters in the [facility] to [employer].  Patient may benefit from cont'd supportive Cslg.  Plan: Follow up with behavioral health clinician on : Tomorrow for 30 min telehealth & then late June for 30 min telehealth Behavioral recommendations: Keep your positive attitude & reaching out to others in similar situations Referral(s): [place] (In Clinic)  I discussed the assessment and treatment plan with the patient and/or parent/guardian. They were provided an opportunity to ask questions and all were answered. They agreed with the plan and demonstrated an understanding of the instructions.   They were advised to call back or seek an in-person evaluation if the symptoms worsen or if the condition fails to improve as anticipated.  [name], LMFT

## 2022-03-21 ENCOUNTER — Inpatient Hospital Stay: Payer: Medicare HMO | Admitting: Hematology and Oncology

## 2022-03-21 ENCOUNTER — Inpatient Hospital Stay: Payer: Medicare HMO | Admitting: Licensed Clinical Social Worker

## 2022-03-21 ENCOUNTER — Other Ambulatory Visit: Payer: Self-pay | Admitting: Hematology and Oncology

## 2022-03-21 ENCOUNTER — Encounter: Payer: Self-pay | Admitting: Hematology and Oncology

## 2022-03-21 ENCOUNTER — Inpatient Hospital Stay: Payer: Medicare HMO

## 2022-03-21 ENCOUNTER — Other Ambulatory Visit: Payer: Self-pay

## 2022-03-21 ENCOUNTER — Other Ambulatory Visit (HOSPITAL_COMMUNITY): Payer: Self-pay

## 2022-03-21 ENCOUNTER — Ambulatory Visit: Payer: Medicare HMO | Admitting: Behavioral Health

## 2022-03-21 VITALS — BP 132/59 | HR 85 | Temp 98.6°F | Resp 18 | Ht 63.0 in | Wt 172.2 lb

## 2022-03-21 DIAGNOSIS — C55 Malignant neoplasm of uterus, part unspecified: Secondary | ICD-10-CM

## 2022-03-21 DIAGNOSIS — E1149 Type 2 diabetes mellitus with other diabetic neurological complication: Secondary | ICD-10-CM | POA: Diagnosis not present

## 2022-03-21 DIAGNOSIS — D509 Iron deficiency anemia, unspecified: Secondary | ICD-10-CM | POA: Diagnosis not present

## 2022-03-21 DIAGNOSIS — Z7952 Long term (current) use of systemic steroids: Secondary | ICD-10-CM | POA: Diagnosis not present

## 2022-03-21 DIAGNOSIS — Z885 Allergy status to narcotic agent status: Secondary | ICD-10-CM | POA: Diagnosis not present

## 2022-03-21 DIAGNOSIS — Z794 Long term (current) use of insulin: Secondary | ICD-10-CM | POA: Diagnosis not present

## 2022-03-21 DIAGNOSIS — Z5111 Encounter for antineoplastic chemotherapy: Secondary | ICD-10-CM | POA: Diagnosis not present

## 2022-03-21 DIAGNOSIS — Z79899 Other long term (current) drug therapy: Secondary | ICD-10-CM | POA: Diagnosis not present

## 2022-03-21 DIAGNOSIS — Z880 Allergy status to penicillin: Secondary | ICD-10-CM | POA: Diagnosis not present

## 2022-03-21 DIAGNOSIS — C541 Malignant neoplasm of endometrium: Secondary | ICD-10-CM | POA: Diagnosis not present

## 2022-03-21 DIAGNOSIS — Z888 Allergy status to other drugs, medicaments and biological substances status: Secondary | ICD-10-CM | POA: Diagnosis not present

## 2022-03-21 DIAGNOSIS — R188 Other ascites: Secondary | ICD-10-CM | POA: Diagnosis not present

## 2022-03-21 DIAGNOSIS — K5730 Diverticulosis of large intestine without perforation or abscess without bleeding: Secondary | ICD-10-CM | POA: Diagnosis not present

## 2022-03-21 DIAGNOSIS — F331 Major depressive disorder, recurrent, moderate: Secondary | ICD-10-CM

## 2022-03-21 DIAGNOSIS — K429 Umbilical hernia without obstruction or gangrene: Secondary | ICD-10-CM | POA: Diagnosis not present

## 2022-03-21 DIAGNOSIS — F419 Anxiety disorder, unspecified: Secondary | ICD-10-CM

## 2022-03-21 DIAGNOSIS — I700 Atherosclerosis of aorta: Secondary | ICD-10-CM | POA: Diagnosis not present

## 2022-03-21 MED ORDER — SODIUM CHLORIDE 0.9% FLUSH
10.0000 mL | Freq: Once | INTRAVENOUS | Status: AC
  Administered 2022-03-21: 10 mL

## 2022-03-21 MED ORDER — MAGNESIUM SULFATE 4 GM/100ML IV SOLN
4.0000 g | Freq: Once | INTRAVENOUS | Status: AC
  Administered 2022-03-21: 4 g via INTRAVENOUS
  Filled 2022-03-21: qty 100

## 2022-03-21 MED ORDER — MAGNESIUM OXIDE -MG SUPPLEMENT 400 (240 MG) MG PO TABS
400.0000 mg | ORAL_TABLET | Freq: Two times a day (BID) | ORAL | 1 refills | Status: DC
  Filled 2022-03-21: qty 60, 30d supply, fill #0

## 2022-03-21 MED ORDER — SODIUM CHLORIDE 0.9 % IV SOLN: Freq: Once | INTRAVENOUS | Status: AC

## 2022-03-21 MED ORDER — HEPARIN SOD (PORK) LOCK FLUSH 100 UNIT/ML IV SOLN
500.0000 [IU] | Freq: Once | INTRAVENOUS | Status: AC
  Administered 2022-03-21: 500 [IU]

## 2022-03-21 NOTE — Progress Notes (Signed)
[place] CSW Progress Note  Holiday representative met with patient to provide support and discuss financial resources. Pt is feeling relieved today as she received good news from her scans on Tuesday and will be meeting with the surgeon next week. After speaking last week, she was able to focus on what was in her control and leaned on her support network of friends to keep her mind occupied this weekend.  CSW provided supportive counsel today.  As patient is on fixed income, CSW also provided resources for potential assistance through [employer] for [place]. Encouraged pt to call me if she has questions or needs help with applications.  CSW will plan to follow-up by phone in about 1 week. Pt welcome to call as needed prior to that time.    Ladonya Jerkins E Harika Laidlaw, LCSW

## 2022-03-21 NOTE — Assessment & Plan Note (Signed)
I have reviewed multiple imaging studies with the patient and her sister She have excellent response to neoadjuvant chemotherapy She is scheduled to see GYN surgeon next week for possible interval debulking surgery We will continue supportive care I will see her back within the month after surgery to resume chemotherapy

## 2022-03-21 NOTE — BH Specialist Note (Signed)
Integrated Behavioral Health via Telemedicine Visit  03/21/2022 Sherri Gill [id number]  Number of Integrated Behavioral Health Clinician visits: 9 Session Start time: 1500 Session End time: 9233 Total time in minutes: 30 min  Referring Provider: Dr. Lenise Herald, MD Patient/Family location: Pt is home in private [facility] Provider location: [facility]  All persons participating in visit: Pt & Clinician Types of Service: Individual psychotherapy  I connected with [name] and/or [place]  self  via  Telephone or Video Enabled Telemedicine Application  (Video is Caregility application) and verified that I am speaking with the correct person using two identifiers. Discussed confidentiality: Yes   I discussed the limitations of telemedicine and the availability of in person appointments.  Discussed there is a possibility of technology failure and discussed alternative modes of communication if that failure occurs.  I discussed that engaging in this telemedicine visit, they consent to the provision of behavioral healthcare and the services will be billed under their insurance.  Patient and/or legal guardian expressed understanding and consented to Telemedicine visit: Yes   Presenting Concerns: Patient and/or family reports the following symptoms/concerns: reduction in anx/dep due to her positive results in her cancer Tx. She is now due for surgery w/[name], MD. Duration of problem: since March 2023; Severity of problem: moderate  Patient and/or Family's Strengths/Protective Factors: Social connections, Social and Emotional competence, Concrete supports in place (healthy food, safe environments, etc.), and Sense of purpose  Goals Addressed: Patient will:  Reduce symptoms of: anxiety, depression, and stress   Increase knowledge and/or ability of: coping skills and stress reduction   Demonstrate ability to: Increase healthy adjustment to current life  circumstances  Progress towards Goals: Ongoing  Interventions: Interventions utilized:  Supportive Counseling Standardized Assessments completed: Not Needed  Patient and/or Family Response: Pt responsive to call today & agrees to transition to [facility] [employer]  Assessment: Patient currently experiencing reduction in anx/dep from her results today of Scan. Her surgeon has already contacted her to schedule her procedure in June. She will not be on Chemo for a month & then surgery w/chemo resuming in July.  Patient may benefit from cont'd therapy through [facility].  Plan: Follow up with behavioral health clinician on : One final session on telehealth for 30 min in late June Behavioral recommendations: None today Referral(s): [place] (In Clinic) and SW [facility]  I discussed the assessment and treatment plan with the patient and/or parent/guardian. They were provided an opportunity to ask questions and all were answered. They agreed with the plan and demonstrated an understanding of the instructions.   They were advised to call back or seek an in-person evaluation if the symptoms worsen or if the condition fails to improve as anticipated.  [name], LMFT

## 2022-03-21 NOTE — Assessment & Plan Note (Signed)
She has persistent anemia She had received multiple doses of intravenous iron Repeat iron studies are adequate I suspect she has anemia secondary to recent chemotherapy and it should improve in time She does not need IV iron today

## 2022-03-21 NOTE — Progress Notes (Signed)
Per Dr. Alvy Bimler - patient will not be receiving chemotherapy treatment today. Patient will receive supplemental magnesium. Infusion RN aware.

## 2022-03-21 NOTE — Assessment & Plan Note (Signed)
We discussed the importance of lifestyle changes and dietary modification while on treatment She will continue her medication for diabetes for now

## 2022-03-21 NOTE — Progress Notes (Signed)
[place] OFFICE PROGRESS NOTE  Patient Care Team: [name], MD as PCP - General (Internal Medicine) [name], RN as Orient Management  ASSESSMENT & PLAN:  Uterine cancer Sherri Gill [facility]) I have reviewed multiple imaging studies with the patient and her sister She have excellent response to neoadjuvant chemotherapy She is scheduled to see GYN surgeon next week for possible interval debulking surgery We will continue supportive care I will see her back within the month after surgery to resume chemotherapy  Iron deficiency anemia She has persistent anemia She had received multiple doses of intravenous iron Repeat iron studies are adequate I suspect she has anemia secondary to recent chemotherapy and it should improve in time She does not need IV iron today  Hypomagnesemia She has mild hypomagnesemia likely due to side effects of treatment I recommend increasing oral magnesium supplement to twice a day We will give her IV magnesium supplement as well  Type 2 diabetes mellitus with neurological complications ([place]) We discussed the importance of lifestyle changes and dietary modification while on treatment She will continue her medication for diabetes for now  Orders Placed This Encounter  Procedures   CA 125    Standing Status:   Standing    Number of Occurrences:   11    Standing Expiration Date:   03/22/2023    All questions were answered. The patient knows to call the clinic with any problems, questions or concerns. The total time spent in the appointment was 30 minutes encounter with patients including review of chart and various tests results, discussions about plan of care and coordination of care plan   [name], MD 03/21/2022 12:53 PM  INTERVAL HISTORY: Please see below for problem oriented charting. she returns for treatment follow-up Since last time I saw her, she felt better She has severe abdominal pain last week  but after CT imaging and multiple bowel movement, her pain completely resolved She feels well No recent nausea  REVIEW OF SYSTEMS:   Constitutional: Denies fevers, chills or abnormal weight loss Eyes: Denies blurriness of vision Ears, nose, mouth, throat, and face: Denies mucositis or sore throat Respiratory: Denies cough, dyspnea or wheezes Cardiovascular: Denies palpitation, chest discomfort or lower extremity swelling Skin: Denies abnormal skin rashes Lymphatics: Denies new lymphadenopathy or easy bruising Neurological:Denies numbness, tingling or new weaknesses Behavioral/Psych: Mood is stable, no new changes  All other systems were reviewed with the patient and are negative.  I have reviewed the past medical history, past surgical history, social history and family history with the patient and they are unchanged from previous note.  ALLERGIES:  is allergic to codeine sulfate, penicillins, percocet [oxycodone-acetaminophen], codeine, and lisinopril.  MEDICATIONS:  Current Outpatient Medications  Medication Sig Dispense Refill   acetaminophen (TYLENOL) 500 MG tablet Take 1,000 mg by mouth every 6 (six) hours as needed for mild pain.     albuterol (VENTOLIN HFA) 108 (90 Base) MCG/ACT inhaler Inhale 2 puffs into the lungs every 4 (four) hours as needed for wheezing or shortness of breath. 8 g 0   amLODipine (NORVASC) 10 MG tablet Take 1 tablet (10 mg total) by mouth at bedtime. 90 tablet 3   atorvastatin (LIPITOR) 20 MG tablet Take 1 tablet (20 mg total) by mouth at bedtime. 30 tablet 11   Blood Glucose Monitoring Suppl (ONE TOUCH ULTRA MINI) w/Device KIT Please use as directed. 1 each 0   Cholecalciferol 1000 units capsule Take 1 capsule (1,000 Units total) by  mouth daily. 90 capsule 1   dexamethasone (DECADRON) 4 MG tablet Take 2 tablets by mouth the night before and 2 tablets the morning of chemotherapy, every 3 weeks for 6 cycles 36 tablet 6   dorzolamide-timolol (COSOPT) 22.3-6.8  MG/ML ophthalmic solution Place 1 drop into both eyes 2 (two) times daily.     glucose blood (ONE TOUCH ULTRA TEST) test strip Use as instructed 100 each 1   Insulin Pen Needle 32G X 4 MM MISC 1 each by Does not apply route daily. 100 each 3   latanoprost (XALATAN) 0.005 % ophthalmic solution Place 1 drop into both eyes at bedtime.     lidocaine-prilocaine (EMLA) cream Apply to affected area once 30 g 3   liraglutide (VICTOZA) 18 MG/3ML SOPN PLEASE START TAKING VICTOZA 0.6MG DAILY WEEK 1, THEN TAKE 1.2MG DAILY WEEK 2, AND THEN TAKE 1.8MG DAILY THEREAFTER (Patient taking differently: Inject 1.2 mg into the skin daily.) 9 mL 3   losartan (COZAAR) 100 MG tablet Take 100 mg by mouth daily.     magnesium oxide (MAG-OX) 400 (240 Mg) MG tablet Take 1 tablet by mouth 2 times daily. 60 tablet 1   ondansetron (ZOFRAN) 8 MG tablet Take 1 tablet (8 mg total) by mouth 2 (two) times daily as needed. Start on the third day after chemotherapy. 30 tablet 1   ONETOUCH DELICA LANCETS 70Y MISC Please use as directed. 100 each 0   pantoprazole (PROTONIX) 40 MG tablet Take 1 tablet by mouth daily. 30 tablet 1   polyethylene glycol powder (GLYCOLAX/MIRALAX) 17 GM/SCOOP powder Take 17 g by mouth daily. 238 g 0   prochlorperazine (COMPAZINE) 10 MG tablet Take 1 tablet (10 mg total) by mouth every 6 (six) hours as needed (Nausea or vomiting). 30 tablet 1   No current facility-administered medications for this visit.    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 Heterogeneous endometrial thickening with appearance of moderate complex fluid in the endometrial canal, possible hemorrhagic material. Endometrial thickness is considered abnormal for an asymptomatic post-menopausal female. Endometrial sampling should be considered to exclude carcinoma. 2. Nonvisualized ovary 3. Heterogeneous  thick-walled urinary bladder, correlate for cystitis.   12/28/2021 Pathology Results   CYTOLOGY - NON PAP  CASE: [id number]  PATIENT: Sherri Gill  Non-Gynecological Cytology Report   Clinical History: Abnormal pelvic US, highly suspicious of malignant ascites  Specimen Submitted:  A. ASCITES, PARACENTESIS:    FINAL MICROSCOPIC DIAGNOSIS:  - Malignant cells consistent with adenocarcinoma  - See comment   SPECIMEN ADEQUACY:  Satisfactory for evaluation   DIAGNOSTIC COMMENTS:  Immunohistochemical stains show that the tumor cells are positive for CK7 and PAX8 while they are negative for CK20, CDX2 and ER, consistent with above interpretation.  Additionally, the tumor cells are diffusely positive for p53 (clonal overexpression), suggestive of a high-grade serous carcinoma.    12/28/2021 Imaging   US pelvis 1. Persistent abnormal appearance of the endometrium, heterogeneously thickened and complex with possible intraluminal fluid. This is similar to that seen on December 2022 ultrasound. Endometrial thickness is considered abnormal for an asymptomatic post-menopausal female. Endometrial sampling should be considered to exclude carcinoma. 2. Nonvisualization of the ovaries.   12/28/2021 Imaging   1. Large volume ascites. 2. Small umbilical hernia containing fat and ascites. 3. Haziness of anterior omentum most likely related to ascites, other etiologies such  as metastatic disease can not be excluded. 4. Diffuse colonic diverticulosis without evidence for diverticulitis.   12/29/2021 Procedure   Successful ultrasound-guided paracentesis yielding 2.8 liters of peritoneal fluid.   01/14/2022 Initial Diagnosis   Uterine cancer ([place])   01/14/2022 Cancer Staging   Staging form: Corpus Uteri - Carcinoma and Carcinosarcoma, AJCC 8th Edition - Clinical: Stage III (cT3, cN0, cM0) - Signed by [name], MD on 01/14/2022 Stage prefix: Initial diagnosis    01/15/2022 Surgery   Surgery: Cervical  dilation under ultrasound guidance, hysteroscopy, endometrial sampling using the Myosure   Surgeons:  Valarie Cones MD    Pathology: endometrial curetteings   Operative findings: On EUA, 8 cm mobile uterus, some nodularity along cul de sac. Cervix normal in appearance without discernible external os. Difficulty noted in dilating the cervix after using scalpel to incise in area of what what thought to be the external os. Under ultrasound guidance, cervix was dilated with confirmation on imaging of intra-uterine placement of dilators. On hysteroscopy, abnormal appearing endocervix and very calcified tissue obscuring good visualization of endometrium concerning for replacement by tumor.    01/15/2022 Pathology Results   FINAL MICROSCOPIC DIAGNOSIS:   A.   ENDOMETRIUM, CURETTAGE:  -    Serous endometrial carcinoma.   COMMENT:   The carcinoma is strongly and diffusely positive for p53 and diffuse p16 positivity.  The ER shows relatively diffuse positivity.  Overall, the histologic features (slit-like glandular spaces and psammoma bodies) and the immunohistochemical phenotype are most characteristic for a serous carcinoma.    01/21/2022 Procedure   Placement of single lumen port a cath via right internal jugular vein. The catheter tip lies at the cavo-atrial junction. A power injectable port a cath was placed and is ready for immediate use.     01/25/2022 -  Chemotherapy   Patient is on Treatment Plan : UTERINE Carboplatin AUC 6 / Paclitaxel q21d      03/21/2022 Imaging   1. Near complete resolution of previously noted ascites, with only small volume residual perihepatic ascites. 2. Improved omental caking and stranding in the ventral abdomen. 3. Findings are consistent with treatment response of peritoneal metastatic disease. 4. Thickening of the urinary bladder wall , consistent with nonspecific infectious or inflammatory cystitis. Correlate with urinalysis.   Aortic Atherosclerosis  (ICD10-I70.0).         PHYSICAL EXAMINATION: ECOG PERFORMANCE STATUS: 1 - Symptomatic but completely ambulatory  Vitals:   03/21/22 0856  BP: (!) 132/59  Pulse: 85  Resp: 18  Temp: 98.6 F (37 C)  SpO2: 97%   Filed Weights   03/21/22 0856  Weight: 172 lb 3.2 oz (78.1 kg)    GENERAL:alert, no distress and comfortable NEURO: alert & oriented x 3 with fluent speech, no focal motor/sensory deficits  LABORATORY DATA:  I have reviewed the data as listed    Component Value Date/Time   NA 142 03/19/2022 1223   NA 139 01/10/2022 1644   K 3.3 (L) 03/19/2022 1223   CL 105 03/19/2022 1223   CO2 28 03/19/2022 1223   GLUCOSE 129 (H) 03/19/2022 1223   GLUCOSE 93 09/10/2006 0834   BUN 13 03/19/2022 1223   BUN 13 01/10/2022 1644   CREATININE 0.76 03/19/2022 1223   CREATININE 0.64 07/13/2014 1621   CALCIUM 9.4 03/19/2022 1223   CALCIUM 9.7 03/29/2010 1554   PROT 7.5 03/19/2022 1223   PROT 7.6 11/09/2020 1156   ALBUMIN 4.1 03/19/2022 1223   ALBUMIN 4.4 11/09/2020 1156  AST 11 (L) 03/19/2022 1223   ALT 8 03/19/2022 1223   ALKPHOS 124 03/19/2022 1223   BILITOT 0.2 (L) 03/19/2022 1223   GFRNONAA >60 03/19/2022 1223   GFRNONAA >89 07/13/2014 1621   GFRAA 86 11/09/2020 1156   GFRAA >89 07/13/2014 1621    No results found for: SPEP, UPEP  Lab Results  Component Value Date   WBC 2.7 (L) 03/19/2022   NEUTROABS 0.8 (L) 03/19/2022   HGB 8.1 (L) 03/19/2022   HCT 25.0 (L) 03/19/2022   MCV 78.9 (L) 03/19/2022   PLT 280 03/19/2022      Chemistry      Component Value Date/Time   NA 142 03/19/2022 1223   NA 139 01/10/2022 1644   K 3.3 (L) 03/19/2022 1223   CL 105 03/19/2022 1223   CO2 28 03/19/2022 1223   BUN 13 03/19/2022 1223   BUN 13 01/10/2022 1644   CREATININE 0.76 03/19/2022 1223   CREATININE 0.64 07/13/2014 1621      Component Value Date/Time   CALCIUM 9.4 03/19/2022 1223   CALCIUM 9.7 03/29/2010 1554   ALKPHOS 124 03/19/2022 1223   AST 11 (L) 03/19/2022  1223   ALT 8 03/19/2022 1223   BILITOT 0.2 (L) 03/19/2022 1223       RADIOGRAPHIC STUDIES: I have reviewed multiple CT imaging with the patient I have personally reviewed the radiological images as listed and agreed with the findings in the report. CT ABDOMEN PELVIS W CONTRAST  Result Date: 03/20/2022 CLINICAL DATA:  Endometrial adenocarcinoma, assess treatment response * Tracking Code: BO * EXAM: CT ABDOMEN AND PELVIS WITH CONTRAST TECHNIQUE: Multidetector CT imaging of the abdomen and pelvis was performed using the standard protocol following bolus administration of intravenous contrast. RADIATION DOSE REDUCTION: This exam was performed according to the departmental dose-optimization program which includes automated exposure control, adjustment of the mA and/or kV according to patient size and/or use of iterative reconstruction technique. CONTRAST:  115m OMNIPAQUE IOHEXOL 300 MG/ML SOLN, additional oral enteric contrast COMPARISON:  12/28/2021 FINDINGS: Lower chest: No acute abnormality. Hepatobiliary: No solid liver abnormality is seen. No gallstones, gallbladder wall thickening, or biliary dilatation. Pancreas: Unremarkable. No pancreatic ductal dilatation or surrounding inflammatory changes. Spleen: Normal in size without significant abnormality. Adrenals/Urinary Tract: Adrenal glands are unremarkable. Kidneys are normal, without renal calculi, solid lesion, or hydronephrosis. Thickening of the urinary bladder wall (series 6, image 48). Stomach/Bowel: Stomach is within normal limits. Appendix appears normal. No evidence of bowel wall thickening, distention, or inflammatory changes. Descending and sigmoid diverticulosis. Vascular/Lymphatic: Aortic atherosclerosis. No enlarged abdominal or pelvic lymph nodes. Reproductive: No mass or other significant abnormality. Other: No abdominal wall hernia or abnormality. Near complete resolution of previously noted ascites, with small volume residual  perihepatic ascites (series 2, image 26). Improved omental caking and stranding in the ventral abdomen (series 2, image 26). Musculoskeletal: No acute or significant osseous findings. IMPRESSION: 1. Near complete resolution of previously noted ascites, with only small volume residual perihepatic ascites. 2. Improved omental caking and stranding in the ventral abdomen. 3. Findings are consistent with treatment response of peritoneal metastatic disease. 4. Thickening of the urinary bladder wall , consistent with nonspecific infectious or inflammatory cystitis. Correlate with urinalysis. Aortic Atherosclerosis (ICD10-I70.0). Electronically Signed   By: ADelanna AhmadiM.D.   On: 03/20/2022 15:23

## 2022-03-21 NOTE — Patient Instructions (Signed)
Magnesium Sulfate Injection What is this medication? MAGNESIUM SULFATE (mag NEE zee um SUL fate) prevents and treats low levels of magnesium in your body. It may also be used to prevent and treat seizures during pregnancy in people with high blood pressure disorders, such as preeclampsia or eclampsia. Magnesium plays an important role in maintaining the health of your muscles and nervous system. This medicine may be used for other purposes; ask your health care provider or pharmacist if you have questions. What should I tell my care team before I take this medication? They need to know if you have any of these conditions: Heart disease History of irregular heart beat Kidney disease An unusual or allergic reaction to magnesium sulfate, medications, foods, dyes, or preservatives Pregnant or trying to get pregnant Breast-feeding How should I use this medication? This medication is for infusion into a vein. It is given in a hospital or clinic setting. Talk to your care team about the use of this medication in children. While this medication may be prescribed for selected conditions, precautions do apply. Overdosage: If you think you have taken too much of this medicine contact a poison control center or emergency room at once. NOTE: This medicine is only for you. Do not share this medicine with others. What if I miss a dose? This does not apply. What may interact with this medication? Certain medications for anxiety or sleep Certain medications for seizures like phenobarbital Digoxin Medications that relax muscles for surgery Narcotic medications for pain This list may not describe all possible interactions. Give your health care provider a list of all the medicines, herbs, non-prescription drugs, or dietary supplements you use. Also tell them if you smoke, drink alcohol, or use illegal drugs. Some items may interact with your medicine. What should I watch for while using this medication? Your  condition will be monitored carefully while you are receiving this medication. You may need blood work done while you are receiving this medication. What side effects may I notice from receiving this medication? Side effects that you should report to your care team as soon as possible: Allergic reactions--skin rash, itching, hives, swelling of the face, lips, tongue, or throat High magnesium level--confusion, drowsiness, facial flushing, redness, sweating, muscle weakness, fast or irregular heartbeat, trouble breathing Low blood pressure--dizziness, feeling faint or lightheaded, blurry vision Side effects that usually do not require medical attention (report to your care team if they continue or are bothersome): Headache Nausea This list may not describe all possible side effects. Call your doctor for medical advice about side effects. You may report side effects to FDA at 1-800-FDA-1088. Where should I keep my medication? This medication is given in a hospital or clinic and will not be stored at home. NOTE: This sheet is a summary. It may not cover all possible information. If you have questions about this medicine, talk to your doctor, pharmacist, or health care provider.  2023 Elsevier/Gold Standard (2020-12-28 00:00:00)  

## 2022-03-21 NOTE — Assessment & Plan Note (Signed)
She has mild hypomagnesemia likely due to side effects of treatment I recommend increasing oral magnesium supplement to twice a day We will give her IV magnesium supplement as well

## 2022-03-22 ENCOUNTER — Telehealth: Payer: Self-pay | Admitting: *Deleted

## 2022-03-22 LAB — CA 125: Cancer Antigen (CA) 125: 21.5 U/mL (ref 0.0–38.1)

## 2022-03-22 NOTE — Telephone Encounter (Signed)
Spoke with pt this afternoon to inform her that we went ahead and scheduled her surgery for June 13th.  This can always be adjusted if needed. We will plan on seeing her in the office on June 1st to discuss further. We just wanted to let her know about the surgery being scheduled because she may receive a phone call from pre op testing at the hospital to set up a pre op appointment as well. She stated they already called and she has a pre op scheduled for June 9th. Pt verbalized understanding.

## 2022-03-26 ENCOUNTER — Other Ambulatory Visit: Payer: Self-pay | Admitting: Hematology and Oncology

## 2022-03-27 ENCOUNTER — Other Ambulatory Visit (HOSPITAL_COMMUNITY): Payer: Self-pay

## 2022-03-28 ENCOUNTER — Inpatient Hospital Stay (HOSPITAL_BASED_OUTPATIENT_CLINIC_OR_DEPARTMENT_OTHER): Payer: Medicare HMO | Admitting: Gynecologic Oncology

## 2022-03-28 ENCOUNTER — Telehealth: Payer: Self-pay | Admitting: Gynecologic Oncology

## 2022-03-28 ENCOUNTER — Telehealth: Payer: Self-pay | Admitting: Licensed Clinical Social Worker

## 2022-03-28 ENCOUNTER — Encounter: Payer: Self-pay | Admitting: Hematology and Oncology

## 2022-03-28 ENCOUNTER — Inpatient Hospital Stay: Payer: Medicare HMO | Attending: Gynecologic Oncology | Admitting: Gynecologic Oncology

## 2022-03-28 ENCOUNTER — Other Ambulatory Visit: Payer: Self-pay

## 2022-03-28 ENCOUNTER — Other Ambulatory Visit (HOSPITAL_COMMUNITY): Payer: Self-pay

## 2022-03-28 VITALS — BP 142/72 | HR 89 | Temp 98.0°F | Resp 18 | Ht 63.0 in | Wt 177.5 lb

## 2022-03-28 DIAGNOSIS — R634 Abnormal weight loss: Secondary | ICD-10-CM | POA: Insufficient documentation

## 2022-03-28 DIAGNOSIS — R180 Malignant ascites: Secondary | ICD-10-CM | POA: Insufficient documentation

## 2022-03-28 DIAGNOSIS — F419 Anxiety disorder, unspecified: Secondary | ICD-10-CM | POA: Diagnosis not present

## 2022-03-28 DIAGNOSIS — C541 Malignant neoplasm of endometrium: Secondary | ICD-10-CM | POA: Diagnosis not present

## 2022-03-28 DIAGNOSIS — G893 Neoplasm related pain (acute) (chronic): Secondary | ICD-10-CM | POA: Diagnosis not present

## 2022-03-28 DIAGNOSIS — C786 Secondary malignant neoplasm of retroperitoneum and peritoneum: Secondary | ICD-10-CM | POA: Diagnosis not present

## 2022-03-28 DIAGNOSIS — R5383 Other fatigue: Secondary | ICD-10-CM | POA: Insufficient documentation

## 2022-03-28 DIAGNOSIS — K5900 Constipation, unspecified: Secondary | ICD-10-CM | POA: Diagnosis not present

## 2022-03-28 DIAGNOSIS — E119 Type 2 diabetes mellitus without complications: Secondary | ICD-10-CM | POA: Diagnosis not present

## 2022-03-28 DIAGNOSIS — D649 Anemia, unspecified: Secondary | ICD-10-CM | POA: Insufficient documentation

## 2022-03-28 DIAGNOSIS — C55 Malignant neoplasm of uterus, part unspecified: Secondary | ICD-10-CM

## 2022-03-28 DIAGNOSIS — K469 Unspecified abdominal hernia without obstruction or gangrene: Secondary | ICD-10-CM | POA: Diagnosis not present

## 2022-03-28 DIAGNOSIS — E1169 Type 2 diabetes mellitus with other specified complication: Secondary | ICD-10-CM

## 2022-03-28 DIAGNOSIS — R69 Illness, unspecified: Secondary | ICD-10-CM | POA: Diagnosis not present

## 2022-03-28 DIAGNOSIS — Z6831 Body mass index (BMI) 31.0-31.9, adult: Secondary | ICD-10-CM | POA: Diagnosis not present

## 2022-03-28 DIAGNOSIS — D72819 Decreased white blood cell count, unspecified: Secondary | ICD-10-CM | POA: Diagnosis not present

## 2022-03-28 DIAGNOSIS — D509 Iron deficiency anemia, unspecified: Secondary | ICD-10-CM

## 2022-03-28 MED ORDER — TRAMADOL HCL 50 MG PO TABS
50.0000 mg | ORAL_TABLET | Freq: Four times a day (QID) | ORAL | 0 refills | Status: DC | PRN
  Filled 2022-03-28: qty 10, 3d supply, fill #0

## 2022-03-28 MED ORDER — POLYETHYLENE GLYCOL 3350 17 GM/SCOOP PO POWD
17.0000 g | Freq: Every day | ORAL | 1 refills | Status: DC
  Filled 2022-03-28: qty 238, 14d supply, fill #0

## 2022-03-28 MED ORDER — IBUPROFEN 600 MG PO TABS
600.0000 mg | ORAL_TABLET | Freq: Three times a day (TID) | ORAL | 0 refills | Status: DC | PRN
  Filled 2022-03-28: qty 30, 10d supply, fill #0

## 2022-03-28 MED ORDER — SENNOSIDES-DOCUSATE SODIUM 8.6-50 MG PO TABS
2.0000 | ORAL_TABLET | Freq: Every day | ORAL | 0 refills | Status: DC
  Filled 2022-03-28: qty 30, 15d supply, fill #0

## 2022-03-28 NOTE — Telephone Encounter (Signed)
[facility]  Attempted to contact pt by phone to follow-up on coping. No answer, no VM set up so unable to leave message. Will attempt to contact pt again at a later time.   Blossie Raffel E Shelina Luo, LCSW

## 2022-03-28 NOTE — Patient Instructions (Signed)
Preparing for your Surgery   Plan for surgery on April 09, 2022 with Dr. Jeral Pinch at [place] will be scheduled for diagnostic laparoscopy (making a small incision on the abdomen and looking with a camera), possible robotic assisted laparoscopic total hysterectomy (removal of the uterus and cervix), bilateral salpingo-oophorectomy (removal of the ovaries and fallopian tubes), debulking, mini laparotomy (slightly larger incision on the abdomen), omentectomy (removal of the omentum), possible hernia repair. There is a possibility the surgery may be done through an open incision if unable to do it through small incisions.    Pre-operative Testing -You will receive a phone call from presurgical testing at [facility] to arrange for a pre-operative appointment and lab work.   -Bring your insurance card, copy of an advanced directive if applicable, medication list   -At that visit, you will be asked to sign a consent for a possible blood transfusion in case a transfusion becomes necessary during surgery.  The need for a blood transfusion is rare but having consent is a necessary part of your care.      -You should not be taking blood thinners or aspirin at least ten days prior to surgery unless instructed by your surgeon.   -Do not take supplements such as fish oil (omega 3), red yeast rice, turmeric before your surgery. You want to avoid medications with aspirin in them including headache powders such as BC or Goody's), Excedrin migraine.   Day Before Surgery at [place] will be asked to take in a light diet the day before surgery. You will be advised you can have clear liquids up until 3 hours before your surgery.     Eat a light diet the day before surgery.  Examples including soups, broths, toast, yogurt, mashed potatoes.  AVOID GAS PRODUCING FOODS. Things to avoid include carbonated beverages (fizzy beverages, sodas), raw fruits and raw vegetables (uncooked), or  beans.    If your bowels are filled with gas, your surgeon will have difficulty visualizing your pelvic organs which increases your surgical risks.   Your role in recovery Your role is to become active as soon as directed by your doctor, while still giving yourself time to heal.  Rest when you feel tired. You will be asked to do the following in order to speed your recovery:   - Cough and breathe deeply. This helps to clear and expand your lungs and can prevent pneumonia after surgery.  - [place]. Do mild physical activity. Walking or moving your legs help your circulation and body functions return to normal. Do not try to get up or walk alone the first time after surgery.   -If you develop swelling on one leg or the other, pain in the back of your leg, redness/warmth in one of your legs, please call the office or go to the Emergency Room to have a doppler to rule out a blood clot. For shortness of breath, chest pain-seek care in the Emergency Room as soon as possible. - Actively manage your pain. Managing your pain lets you move in comfort. We will ask you to rate your pain on a scale of zero to 10. It is your responsibility to tell your doctor or nurse where and how much you hurt so your pain can be treated.   Special Considerations -If you are diabetic, you may be placed on insulin after surgery to have closer control over your blood sugars to promote healing and  recovery.  This does not mean that you will be discharged on insulin.  If applicable, your oral antidiabetics will be resumed when you are tolerating a solid diet.   -Your final pathology results from surgery should be available around one week after surgery and the results will be relayed to you when available.   -Dr. Lahoma Crocker is the surgeon that assists your GYN Oncologist with surgery.  If you end up staying the night, the next day after your surgery you will either see [name] or Dr. Lahoma Crocker.   -FMLA forms can be faxed to [phone] and please allow 5-7 business days for completion.   Pain Management After Surgery -You have been prescribed your pain medication and bowel regimen medications before surgery so that you can have these available when you are discharged from the hospital. The pain medication is for use ONLY AFTER surgery and a new prescription will not be given.    -Make sure that you have Tylenol and Ibuprofen at home to use on a regular basis after surgery for pain control. We recommend alternating the medications every hour to six hours since they work differently and are processed in the body differently for pain relief.   -Review the attached handout on narcotic use and their risks and side effects.    Bowel Regimen -You have been prescribed Sennakot-S to take nightly to prevent constipation especially if you are taking the narcotic pain medication intermittently.  It is important to prevent constipation and drink adequate amounts of liquids. You can stop taking this medication when you are not taking pain medication and you are back on your normal bowel routine.   Risks of Surgery Risks of surgery are low but include bleeding, infection, damage to surrounding structures, re-operation, blood clots, and very rarely death.     Blood Transfusion Information (For the consent to be signed before surgery)   We will be checking your blood type before surgery so in case of emergencies, we will know what type of blood you would need.                                             WHAT IS A BLOOD TRANSFUSION?   A transfusion is the replacement of blood or some of its parts. Blood is made up of multiple cells which provide different functions. Red blood cells carry oxygen and are used for blood loss replacement. White blood cells fight against infection. Platelets control bleeding. Plasma helps clot blood. Other blood products are available for specialized  needs, such as hemophilia or other clotting disorders. BEFORE THE TRANSFUSION  Who gives blood for transfusions?  You may be able to donate blood to be used at a later date on yourself (autologous donation). Relatives can be asked to donate blood. This is generally not any safer than if you have received blood from a stranger. The same precautions are taken to ensure safety when a relative's blood is donated. Healthy volunteers who are fully evaluated to make sure their blood is safe. This is blood bank blood. Transfusion therapy is the safest it has ever been in the practice of medicine. Before blood is taken from a donor, a complete history is taken to make sure that person has no history of diseases nor engages in risky social behavior (examples are intravenous drug use or sexual activity with  multiple partners). The donor's travel history is screened to minimize risk of transmitting infections, such as malaria. The donated blood is tested for signs of infectious diseases, such as HIV and hepatitis. The blood is then tested to be sure it is compatible with you in order to minimize the chance of a transfusion reaction. If you or a relative donates blood, this is often done in anticipation of surgery and is not appropriate for emergency situations. It takes many days to process the donated blood. RISKS AND COMPLICATIONS Although transfusion therapy is very safe and saves many lives, the main dangers of transfusion include:  Getting an infectious disease. Developing a transfusion reaction. This is an allergic reaction to something in the blood you were given. Every precaution is taken to prevent this. The decision to have a blood transfusion has been considered carefully by your caregiver before blood is given. Blood is not given unless the benefits outweigh the risks.   AFTER SURGERY INSTRUCTIONS   Return to work: 4-6 weeks if applicable   YOU WILL NEED TO BE ON A BLOOD THINNER AFTER SURGERY TO  HELP WITH PREVENTING BLOOD CLOTS. OPTIONS FOR THIS INCLUDE ONCE DAILY INJECTION OR A PILL. YOU WILL START THIS ON THE DAY AFTER SURGERY AND IT SHOULD BE TAKEN OR GIVEN AROUND THE SAME TIME EACH DAY. WE WILL SEND THIS IN BASED ON THE TYPE SURGERY YOU HAVE. FOR OPEN SURGERY, YOU WOULD NEED A TOTAL OF 4 WEEKS. IF DONE THROUGH SMALL INCISIONS, IT WOULD BE FOR 2 WEEKS.   You will have a white honeycomb dressing over your larger incision. This dressing can be removed 5 days after surgery and you do not need to reapply a new dressing. Once you remove the dressing, you will notice that you have the surgical glue (dermabond) on the incision and this will peel off on its own. You can get this dressing wet in the shower the days after surgery prior to removal on the 5th day.    Activity: 1. Be up and out of the bed during the day.  Take a nap if needed.  You may walk up steps but be careful and use the hand rail.  Stair climbing will tire you more than you think, you may need to stop part way and rest.    2. No lifting or straining for 6 weeks over 10 pounds. No pushing, pulling, straining for 6 weeks.   3. No driving for around 1 week(s).  Do not drive if you are taking narcotic pain medicine and make sure that your reaction time has returned.    4. You can shower as soon as the next day after surgery. Shower daily.  Use your regular soap and water (not directly on the incision) and pat your incision(s) dry afterwards; don't rub.  No tub baths or submerging your body in water until cleared by your surgeon. If you have the soap that was given to you by pre-surgical testing that was used before surgery, you do not need to use it afterwards because this can irritate your incisions.    5. No sexual activity and nothing in the vagina for 8 weeks.   6. You may experience a small amount of clear drainage from your incisions, which is normal.  If the drainage persists, increases, or changes color please call the  office.   7. Do not use creams, lotions, or ointments such as neosporin on your incisions after surgery until advised by your surgeon because they  can cause removal of the dermabond glue on your incisions.     8. You may experience vaginal spotting after surgery or around the 6-8 week mark from surgery when the stitches at the top of the vagina begin to dissolve.  The spotting is normal but if you experience heavy bleeding, call our office.   9. Take Tylenol or ibuprofen first for pain and only use narcotic pain medication for severe pain not relieved by the Tylenol or Ibuprofen.  Monitor your Tylenol intake to a max of 4,000 mg in a 24 hour period. You can alternate these medications after surgery.   Diet: 1. Low sodium Heart Healthy Diet is recommended but you are cleared to resume your normal (before surgery) diet after your procedure.   2. It is safe to use a laxative, such as Miralax or Colace, if you have difficulty moving your bowels. You have been prescribed Sennakot-S to take at bedtime every evening after surgery to keep bowel movements regular and to prevent constipation.     Wound Care: 1. Keep clean and dry.  Shower daily.   Reasons to call the Doctor: Fever - Oral temperature greater than 100.4 degrees Fahrenheit Foul-smelling vaginal discharge Difficulty urinating Nausea and vomiting Increased pain at the site of the incision that is unrelieved with pain medicine. Difficulty breathing with or without chest pain New calf pain especially if only on one side Sudden, continuing increased vaginal bleeding with or without clots.   Contacts: For questions or concerns you should contact:   Dr. Jeral Pinch at [phone]   [name], NP at [phone]   After Hours: call [phone] and have the GYN Oncologist paged/contacted (after 5 pm or on the weekends).   Messages sent via mychart are for non-urgent matters and are not responded to after hours so for urgent  needs, please call the after hours number.

## 2022-03-28 NOTE — Patient Instructions (Addendum)
Preparing for your Surgery  Plan for surgery on April 09, 2022 with Dr. Jeral Pinch at [place] will be scheduled for diagnostic laparoscopy (making a small incision on the abdomen and looking with a camera), possible robotic assisted laparoscopic total hysterectomy (removal of the uterus and cervix), bilateral salpingo-oophorectomy (removal of the ovaries and fallopian tubes), debulking, mini laparotomy (slightly larger incision on the abdomen), omentectomy (removal of the omentum), possible hernia repair. There is a possibility the surgery may be done through an open incision if unable to do it through small incisions.   Pre-operative Testing -You will receive a phone call from presurgical testing at [facility] to arrange for a pre-operative appointment and lab work.  -Bring your insurance card, copy of an advanced directive if applicable, medication list  -At that visit, you will be asked to sign a consent for a possible blood transfusion in case a transfusion becomes necessary during surgery.  The need for a blood transfusion is rare but having consent is a necessary part of your care.     -You should not be taking blood thinners or aspirin at least ten days prior to surgery unless instructed by your surgeon.  -Do not take supplements such as fish oil (omega 3), red yeast rice, turmeric before your surgery. You want to avoid medications with aspirin in them including headache powders such as BC or Goody's), Excedrin migraine.  Day Before Surgery at [place] will be asked to take in a light diet the day before surgery. You will be advised you can have clear liquids up until 3 hours before your surgery.    Eat a light diet the day before surgery.  Examples including soups, broths, toast, yogurt, mashed potatoes.  AVOID GAS PRODUCING FOODS. Things to avoid include carbonated beverages (fizzy beverages, sodas), raw fruits and raw vegetables (uncooked), or beans.    If your bowels are filled with gas, your surgeon will have difficulty visualizing your pelvic organs which increases your surgical risks.  Your role in recovery Your role is to become active as soon as directed by your doctor, while still giving yourself time to heal.  Rest when you feel tired. You will be asked to do the following in order to speed your recovery:  - Cough and breathe deeply. This helps to clear and expand your lungs and can prevent pneumonia after surgery.  - [place]. Do mild physical activity. Walking or moving your legs help your circulation and body functions return to normal. Do not try to get up or walk alone the first time after surgery.   -If you develop swelling on one leg or the other, pain in the back of your leg, redness/warmth in one of your legs, please call the office or go to the Emergency Room to have a doppler to rule out a blood clot. For shortness of breath, chest pain-seek care in the Emergency Room as soon as possible. - Actively manage your pain. Managing your pain lets you move in comfort. We will ask you to rate your pain on a scale of zero to 10. It is your responsibility to tell your doctor or nurse where and how much you hurt so your pain can be treated.  Special Considerations -If you are diabetic, you may be placed on insulin after surgery to have closer control over your blood sugars to promote healing and recovery.  This does not mean that you will be discharged on  insulin.  If applicable, your oral antidiabetics will be resumed when you are tolerating a solid diet.  -Your final pathology results from surgery should be available around one week after surgery and the results will be relayed to you when available.  -Dr. Lahoma Crocker is the surgeon that assists your GYN Oncologist with surgery.  If you end up staying the night, the next day after your surgery you will either see [name] or Dr. Lahoma Crocker.  -FMLA  forms can be faxed to [phone] and please allow 5-7 business days for completion.  Pain Management After Surgery -You have been prescribed your pain medication and bowel regimen medications before surgery so that you can have these available when you are discharged from the hospital. The pain medication is for use ONLY AFTER surgery and a new prescription will not be given.   -Make sure that you have Tylenol and Ibuprofen at home to use on a regular basis after surgery for pain control. We recommend alternating the medications every hour to six hours since they work differently and are processed in the body differently for pain relief.  -Review the attached handout on narcotic use and their risks and side effects.   Bowel Regimen -You have been prescribed Sennakot-S to take nightly to prevent constipation especially if you are taking the narcotic pain medication intermittently.  It is important to prevent constipation and drink adequate amounts of liquids. You can stop taking this medication when you are not taking pain medication and you are back on your normal bowel routine.  Risks of Surgery Risks of surgery are low but include bleeding, infection, damage to surrounding structures, re-operation, blood clots, and very rarely death.   Blood Transfusion Information (For the consent to be signed before surgery)  We will be checking your blood type before surgery so in case of emergencies, we will know what type of blood you would need.                                            WHAT IS A BLOOD TRANSFUSION?  A transfusion is the replacement of blood or some of its parts. Blood is made up of multiple cells which provide different functions. Red blood cells carry oxygen and are used for blood loss replacement. White blood cells fight against infection. Platelets control bleeding. Plasma helps clot blood. Other blood products are available for specialized needs, such as hemophilia or other  clotting disorders. BEFORE THE TRANSFUSION  Who gives blood for transfusions?  You may be able to donate blood to be used at a later date on yourself (autologous donation). Relatives can be asked to donate blood. This is generally not any safer than if you have received blood from a stranger. The same precautions are taken to ensure safety when a relative's blood is donated. Healthy volunteers who are fully evaluated to make sure their blood is safe. This is blood bank blood. Transfusion therapy is the safest it has ever been in the practice of medicine. Before blood is taken from a donor, a complete history is taken to make sure that person has no history of diseases nor engages in risky social behavior (examples are intravenous drug use or sexual activity with multiple partners). The donor's travel history is screened to minimize risk of transmitting infections, such as malaria. The donated blood is tested for signs of  infectious diseases, such as HIV and hepatitis. The blood is then tested to be sure it is compatible with you in order to minimize the chance of a transfusion reaction. If you or a relative donates blood, this is often done in anticipation of surgery and is not appropriate for emergency situations. It takes many days to process the donated blood. RISKS AND COMPLICATIONS Although transfusion therapy is very safe and saves many lives, the main dangers of transfusion include:  Getting an infectious disease. Developing a transfusion reaction. This is an allergic reaction to something in the blood you were given. Every precaution is taken to prevent this. The decision to have a blood transfusion has been considered carefully by your caregiver before blood is given. Blood is not given unless the benefits outweigh the risks.  AFTER SURGERY INSTRUCTIONS  Return to work: 4-6 weeks if applicable  YOU WILL NEED TO BE ON A BLOOD THINNER AFTER SURGERY TO HELP WITH PREVENTING BLOOD CLOTS. OPTIONS  FOR THIS INCLUDE ONCE DAILY INJECTION OR A PILL. YOU WILL START THIS ON THE DAY AFTER SURGERY AND IT SHOULD BE TAKEN OR GIVEN AROUND THE SAME TIME EACH DAY. WE WILL SEND THIS IN BASED ON THE TYPE SURGERY YOU HAVE. FOR OPEN SURGERY, YOU WOULD NEED A TOTAL OF 4 WEEKS. IF DONE THROUGH SMALL INCISIONS, IT WOULD BE FOR 2 WEEKS.  You will have a white honeycomb dressing over your larger incision. This dressing can be removed 5 days after surgery and you do not need to reapply a new dressing. Once you remove the dressing, you will notice that you have the surgical glue (dermabond) on the incision and this will peel off on its own. You can get this dressing wet in the shower the days after surgery prior to removal on the 5th day.   Activity: 1. Be up and out of the bed during the day.  Take a nap if needed.  You may walk up steps but be careful and use the hand rail.  Stair climbing will tire you more than you think, you may need to stop part way and rest.   2. No lifting or straining for 6 weeks over 10 pounds. No pushing, pulling, straining for 6 weeks.  3. No driving for around 1 week(s).  Do not drive if you are taking narcotic pain medicine and make sure that your reaction time has returned.   4. You can shower as soon as the next day after surgery. Shower daily.  Use your regular soap and water (not directly on the incision) and pat your incision(s) dry afterwards; don't rub.  No tub baths or submerging your body in water until cleared by your surgeon. If you have the soap that was given to you by pre-surgical testing that was used before surgery, you do not need to use it afterwards because this can irritate your incisions.   5. No sexual activity and nothing in the vagina for 8 weeks.  6. You may experience a small amount of clear drainage from your incisions, which is normal.  If the drainage persists, increases, or changes color please call the office.  7. Do not use creams, lotions, or ointments  such as neosporin on your incisions after surgery until advised by your surgeon because they can cause removal of the dermabond glue on your incisions.    8. You may experience vaginal spotting after surgery or around the 6-8 week mark from surgery when the stitches at the top of  the vagina begin to dissolve.  The spotting is normal but if you experience heavy bleeding, call our office.  9. Take Tylenol or ibuprofen first for pain and only use narcotic pain medication for severe pain not relieved by the Tylenol or Ibuprofen.  Monitor your Tylenol intake to a max of 4,000 mg in a 24 hour period. You can alternate these medications after surgery.  Diet: 1. Low sodium Heart Healthy Diet is recommended but you are cleared to resume your normal (before surgery) diet after your procedure.  2. It is safe to use a laxative, such as Miralax or Colace, if you have difficulty moving your bowels. You have been prescribed Sennakot-S to take at bedtime every evening after surgery to keep bowel movements regular and to prevent constipation.    Wound Care: 1. Keep clean and dry.  Shower daily.  Reasons to call the Doctor: Fever - Oral temperature greater than 100.4 degrees Fahrenheit Foul-smelling vaginal discharge Difficulty urinating Nausea and vomiting Increased pain at the site of the incision that is unrelieved with pain medicine. Difficulty breathing with or without chest pain New calf pain especially if only on one side Sudden, continuing increased vaginal bleeding with or without clots.   Contacts: For questions or concerns you should contact:  Dr. Jeral Pinch at [phone]  [name], NP at [phone]  After Hours: call [phone] and have the GYN Oncologist paged/contacted (after 5 pm or on the weekends).  Messages sent via mychart are for non-urgent matters and are not responded to after hours so for urgent needs, please call the after hours number.

## 2022-03-28 NOTE — H&P (View-Only) (Signed)
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 Heterogeneous endometrial thickening with appearance of moderate complex fluid in the endometrial canal, possible hemorrhagic material. Endometrial thickness is considered abnormal for an asymptomatic post-menopausal female. Endometrial sampling should be considered to exclude carcinoma. 2. Nonvisualized ovary 3. Heterogeneous thick-walled urinary bladder, correlate for cystitis.   12/28/2021 Pathology Results   CYTOLOGY - NON PAP  CASE: [id number]  PATIENT: Sherri Gill  Non-Gynecological Cytology Report   Clinical History: Abnormal pelvic US, highly suspicious of malignant ascites  Specimen Submitted:  A. ASCITES, PARACENTESIS:    FINAL MICROSCOPIC DIAGNOSIS:  - Malignant cells consistent with adenocarcinoma  - See comment   SPECIMEN ADEQUACY:  Satisfactory for evaluation   DIAGNOSTIC COMMENTS:  Immunohistochemical stains show that the tumor cells are positive for CK7 and PAX8 while they are negative for CK20, CDX2 and ER, consistent with above interpretation.  Additionally, the tumor cells are diffusely positive for p53 (clonal overexpression), suggestive of a high-grade serous carcinoma.    12/28/2021 Imaging   US pelvis 1. Persistent abnormal appearance of the endometrium, heterogeneously thickened and complex with possible intraluminal fluid. This is similar to that seen on December 2022 ultrasound. Endometrial thickness is considered abnormal for an asymptomatic post-menopausal female. Endometrial sampling should be considered to exclude carcinoma. 2. Nonvisualization of the ovaries.   12/28/2021 Imaging   1. Large volume ascites. 2. Small umbilical hernia containing fat and ascites. 3. Haziness of anterior  omentum most likely related to ascites, other etiologies such as metastatic disease can not be excluded. 4. Diffuse colonic diverticulosis without evidence for diverticulitis.   12/29/2021 Procedure   Successful ultrasound-guided paracentesis yielding 2.8 liters of peritoneal fluid.   01/14/2022 Initial Diagnosis   Uterine cancer ([place])   01/14/2022 Cancer Staging   Staging form: Corpus Uteri - Carcinoma and Carcinosarcoma, AJCC 8th Edition - Clinical: Stage III (cT3, cN0, cM0) - Signed by [name], MD on 01/14/2022 Stage prefix: Initial diagnosis    01/15/2022 Surgery   Surgery: Cervical dilation under ultrasound guidance, hysteroscopy, endometrial sampling using the Myosure   Surgeons:  Valarie Cones MD    Pathology: endometrial curetteings   Operative findings: On EUA, 8 cm mobile uterus, some nodularity along cul de sac. Cervix normal in appearance without discernible external os. Difficulty noted in dilating the cervix after using scalpel to incise in area of what what thought to be the external os. Under ultrasound guidance, cervix was dilated with confirmation on imaging of intra-uterine placement of dilators. On hysteroscopy, abnormal appearing endocervix and very calcified tissue obscuring good visualization of endometrium concerning for replacement by tumor.    01/15/2022 Pathology Results   FINAL MICROSCOPIC DIAGNOSIS:   A.   ENDOMETRIUM, CURETTAGE:  -    Serous endometrial carcinoma.   COMMENT:   The carcinoma is strongly and diffusely positive for p53 and diffuse p16 positivity.  The ER shows relatively diffuse positivity.  Overall, the histologic features (slit-like glandular spaces and psammoma bodies) and the immunohistochemical phenotype are most characteristic for a serous carcinoma.    01/21/2022 Procedure   Placement of single lumen port a cath via right internal jugular vein. The catheter tip lies at the cavo-atrial junction. A power injectable port a cath was placed  and is ready for immediate use.  01/25/2022 -  Chemotherapy   Patient is on Treatment Plan : UTERINE Carboplatin AUC 6 / Paclitaxel q21d      03/21/2022 Imaging   1. Near complete resolution of previously noted ascites, with only small volume residual perihepatic ascites. 2. Improved omental caking and stranding in the ventral abdomen. 3. Findings are consistent with treatment response of peritoneal metastatic disease. 4. Thickening of the urinary bladder wall , consistent with nonspecific infectious or inflammatory cystitis. Correlate with urinalysis.   Aortic Atherosclerosis (ICD10-I70.0).       03/22/2022 Tumor Marker   Patient's tumor was tested for the following markers: CA-125. Results of the tumor marker test revealed 21.5.     Interval History: Patient reports doing well.  She had increased difficulty after her last cycle of chemotherapy.  She was having significant lower extremity cramping and some difficulty with walking.  Things have improved since she increased how much magnesium she is taking each day.  She notes improving bowel function, has been using MiraLAX daily since last week.  She denies any urinary symptoms.  She denies any vaginal bleeding or discharge.  She notes pain has ceased since Easter.  Continues to have some fatigue.  Lost about 26 pounds from prior to starting treatment, weight appears to have stabilized.  Past Medical/Surgical History: Past Medical History:  Diagnosis Date   AKI (acute kidney injury) ([place])    History of   Ascites    09/2021 paracentesis with almost 3 L of fluid 12/2021 Paracentesis performed with 2.8 L   Chronic anemia 08/29/2012   Need colonoscopy or report. Need iron panel.    Dyslipidemia 06/30/2007   Dyspnea    Essential hypertension, benign 06/30/2007   GERD 06/30/2007   History of blood transfusion    History of fatty infiltration of liver    History of migraine    Hx of Herpes simplex meningitis 2015   Also noted to  have primary empty sella on imaging at this admission   Insomnia 06/11/2012   Major depressive disorder, recurrent episode, moderate with anxious distress ([place]) 06/30/2007   Nausea and vomiting 01/13/2022   Obesity, BMI 35-40 06/11/2012   Peripheral neuropathy 2/2 T2DM 12/28/2009   Primary empty sella syndrome ([place]) 2015   Noted on imaging during hospitalization for herpes meningitis; no pituitary mass, no hormone w/u at that time, hormonally asymptomatic   Type 2 diabetes mellitus with neurological complications ([place]) 16/07/9603    Past Surgical History:  Procedure Laterality Date   COLONOSCOPY W/ POLYPECTOMY  06/28/2004   DENTAL SURGERY     DILATATION & CURETTAGE/HYSTEROSCOPY WITH MYOSURE N/A 01/15/2022   Procedure: DILATATION & CURETTAGE/HYSTEROSCOPY WITH MYOSURE;  Surgeon: Lafonda Mosses, MD;  Location: [facility];  Service: Gynecology;  Laterality: N/A;  DO NOT OPEN HYSTEROSCOPY KIT   IR IMAGING GUIDED PORT INSERTION  01/18/2022   OPERATIVE ULTRASOUND N/A 01/15/2022   Procedure: OPERATIVE ULTRASOUND;  Surgeon: Lafonda Mosses, MD;  Location: [facility];  Service: Gynecology;  Laterality: N/A;   TUBAL LIGATION      Family History  Adopted: Yes  Problem Relation Age of Onset   Dementia Mother    Breast cancer Sister        paternal half-sister   Liver cancer Brother        maternal half-brother   Thyroid cancer Maternal Aunt    Prostate cancer Maternal Uncle    Prostate cancer Cousin    Breast cancer Other    Ovarian cancer Neg Hx  Colon cancer Neg Hx    Endometrial cancer Neg Hx    Pancreatic cancer Neg Hx     Social History   Socioeconomic History   Marital status: Single    Spouse name: Not on file   Number of children: Not on file   Years of education: 12   Highest education level: Not on file  Occupational History   Occupation: SALES    Employer: [employer]  Tobacco Use   Smoking status: Former    Types: Cigarettes    Quit date: 10/28/1978    Years since  quitting: 43.4   Smokeless tobacco: Never  Vaping Use   Vaping Use: Never used  Substance and Sexual Activity   Alcohol use: No    Alcohol/week: 0.0 standard drinks   Drug use: No   Sexual activity: Not Currently    Partners: Male    Birth control/protection: Post-menopausal  Other Topics Concern   Not on file  Social History Narrative   Not on file   Social Determinants of Health   Financial Resource Strain: Medium Risk   Difficulty of Paying Living Expenses: Somewhat hard  Food Insecurity: Not on file  Transportation Needs: Not on file  Physical Activity: Not on file  Stress: Stress Concern Present   Feeling of Stress : Very much  Social Connections: Not on file    Current Medications: Current Outpatient Medications on File Prior to Visit  Medication Sig Dispense Refill   acetaminophen (TYLENOL) 500 MG tablet Take 1,000 mg by mouth every 6 (six) hours as needed for mild pain.     albuterol (VENTOLIN HFA) 108 (90 Base) MCG/ACT inhaler Inhale 2 puffs into the lungs every 4 (four) hours as needed for wheezing or shortness of breath. 8 g 0   amLODipine (NORVASC) 10 MG tablet Take 1 tablet (10 mg total) by mouth at bedtime. 90 tablet 3   atorvastatin (LIPITOR) 20 MG tablet Take 1 tablet (20 mg total) by mouth at bedtime. 30 tablet 11   Blood Glucose Monitoring Suppl (ONE TOUCH ULTRA MINI) w/Device KIT Please use as directed. 1 each 0   Cholecalciferol 1000 units capsule Take 1 capsule (1,000 Units total) by mouth daily. 90 capsule 1   dexamethasone (DECADRON) 4 MG tablet Take 2 tablets by mouth the night before and 2 tablets the morning of chemotherapy, every 3 weeks for 6 cycles 36 tablet 6   dorzolamide-timolol (COSOPT) 22.3-6.8 MG/ML ophthalmic solution Place 1 drop into both eyes 2 (two) times daily.     glucose blood (ONE TOUCH ULTRA TEST) test strip Use as instructed 100 each 1   Insulin Pen Needle 32G X 4 MM MISC 1 each by Does not apply route daily. 100 each 3    latanoprost (XALATAN) 0.005 % ophthalmic solution Place 1 drop into both eyes at bedtime.     lidocaine-prilocaine (EMLA) cream Apply to affected area once 30 g 3   liraglutide (VICTOZA) 18 MG/3ML SOPN PLEASE START TAKING VICTOZA 0.6MG DAILY WEEK 1, THEN TAKE 1.2MG DAILY WEEK 2, AND THEN TAKE 1.8MG DAILY THEREAFTER (Patient taking differently: Inject 1.2 mg into the skin daily.) 9 mL 3   losartan (COZAAR) 100 MG tablet Take 100 mg by mouth daily.     ondansetron (ZOFRAN) 8 MG tablet Take 1 tablet (8 mg total) by mouth 2 (two) times daily as needed. Start on the third day after chemotherapy. 30 tablet 1   ONETOUCH DELICA LANCETS 62B MISC Please use as directed. 100 each  0   pantoprazole (PROTONIX) 40 MG tablet Take 1 tablet by mouth daily. 30 tablet 1   prochlorperazine (COMPAZINE) 10 MG tablet Take 1 tablet (10 mg total) by mouth every 6 (six) hours as needed (Nausea or vomiting). 30 tablet 1   magnesium oxide (MAG-OX) 400 (240 Mg) MG tablet Take 1 tablet by mouth 2 times daily. 60 tablet 1   No current facility-administered medications on file prior to visit.    Review of Systems: + Chills, fatigue, weight changes, abdominal pain, constipation, anxiety. Denies appetite changes, fevers. Denies hearing loss, neck lumps or masses, mouth sores, ringing in ears or voice changes. Denies cough or wheezing.  Denies shortness of breath. Denies chest pain or palpitations. Denies leg swelling. Denies abdominal distention, blood in stools, diarrhea, nausea, vomiting, or early satiety. Denies pain with intercourse, dysuria, frequency, hematuria or incontinence. Denies hot flashes, pelvic pain, vaginal bleeding or vaginal discharge.   Denies joint pain, back pain or muscle pain/cramps. Denies itching, rash, or wounds. Denies dizziness, headaches, numbness or seizures. Denies swollen lymph nodes or glands, denies easy bruising or bleeding. Denies depression, confusion, or decreased  concentration.  Physical Exam: BP (!) 142/72 (BP Location: Left Arm, Patient Position: Sitting)   Pulse 89   Temp 98 F (36.7 C) (Oral)   Resp 18   Ht 5' 3"  (1.6 m)   Wt 177 lb 8 oz (80.5 kg)   SpO2 100%   BMI 31.44 kg/m  General: Alert, oriented, no acute distress. HEENT: Normocephalic, atraumatic, sclera anicteric. Chest: Clear to auscultation bilaterally.  No wheezes or rhonchi. Cardiovascular: Regular rate and rhythm, no murmurs. Abdomen: soft, nontender.  Normoactive bowel sounds.  No masses or hepatosplenomegaly appreciated.  Questionable mild firmness just above the umbilicus in the midline. Extremities: Grossly normal range of motion.  Warm, well perfused.  No edema bilaterally. Skin: No rashes or lesions noted. Lymphatics: No cervical, supraclavicular, or inguinal adenopathy. GU: Normal appearing external genitalia without erythema, excoriation, or lesions.  Speculum exam reveals mildly atrophic cervix, no vaginal lesions.  No blood or discharge within the vaginal vault.  Bimanual exam reveals mobile, 10 cm uterus.  Cervix is smooth, not nodular or firm.    Laboratory & Radiologic Studies: 03/19/22: CT A/P -  1. Near complete resolution of previously noted ascites, with only small volume residual perihepatic ascites. 2. Improved omental caking and stranding in the ventral abdomen. 3. Findings are consistent with treatment response of peritoneal metastatic disease. 4. Thickening of the urinary bladder wall , consistent with nonspecific infectious or inflammatory cystitis. Correlate with urinalysis.  Component Ref Range & Units 8 d ago 2 mo ago  Cancer Antigen (CA) 125 0.0 - 38.1 U/mL 21.5  892.0 High  CM    Assessment & Plan: Sherri Gill is a 71 y.o. woman with advanced serous uterine cancer who presents for discussion of treatment planning.  The patient has overall had a response to neoadjuvant chemotherapy.  On my review of her CT scan, there still appears to be  some omental disease.  I discussed plan with her for diagnostic laparoscopy to evaluate extent of disease at the start of surgery.  If it appears feasible to perform at least a portion of the surgery robotically, then we will opt for this approach.  At minimum, the patient will require a mini laparotomy for intra-abdominal palpation.  If significant omental and/or peritoneal disease, then we will plan for surgery via laparotomy.  Given her hernia, we have discussed the  possibility of primary hernia repair.  Plan to repeat blood work next week given anemia and leukopenia.  I stressed the importance of good glycemic control in the perioperative.  To decrease the risk of surgical morbidity.  We reviewed the plan for a diagnostic laparoscopy with open versus robotic assisted hysterectomy, bilateral salpingo-oophorectomy, tumor debulking including omentectomy, possible primary hernia repair. The risks of surgery were discussed in detail and she understands these to include infection; wound separation; hernia; vaginal cuff separation, injury to adjacent organs such as bowel, bladder, blood vessels, ureters and nerves; bleeding which may require blood transfusion; anesthesia risk; thromboembolic events; possible death; unforeseen complications; possible need for re-exploration; medical complications such as heart attack, stroke, pleural effusion and pneumonia. The patient will receive DVT and antibiotic prophylaxis as indicated. Plan for 2 weeks of anticoagulation post-op if MIS and 4 weeks if laparotomy. She voiced a clear understanding. She had the opportunity to ask questions. Perioperative instructions were reviewed with her. Prescriptions for post-op medications were sent to her pharmacy of choice.  36 minutes of total time was spent for this patient encounter, including preparation, face-to-face counseling with the patient and coordination of care, and documentation of the encounter.  Jeral Pinch, MD   Division of Gynecologic Oncology  Department of Obstetrics and Gynecology  [facility]

## 2022-03-28 NOTE — Telephone Encounter (Signed)
Error

## 2022-03-28 NOTE — Progress Notes (Unsigned)
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 Heterogeneous endometrial thickening with appearance of moderate complex fluid in the endometrial canal, possible hemorrhagic material. Endometrial thickness is considered abnormal for an asymptomatic post-menopausal female. Endometrial sampling should be considered to exclude carcinoma. 2. Nonvisualized ovary 3. Heterogeneous thick-walled urinary bladder, correlate for cystitis.   12/28/2021 Pathology Results   CYTOLOGY - NON PAP  CASE: [id number]  PATIENT: Sherri Gill  Non-Gynecological Cytology Report   Clinical History: Abnormal pelvic US, highly suspicious of malignant ascites  Specimen Submitted:  A. ASCITES, PARACENTESIS:    FINAL MICROSCOPIC DIAGNOSIS:  - Malignant cells consistent with adenocarcinoma  - See comment   SPECIMEN ADEQUACY:  Satisfactory for evaluation   DIAGNOSTIC COMMENTS:  Immunohistochemical stains show that the tumor cells are positive for CK7 and PAX8 while they are negative for CK20, CDX2 and ER, consistent with above interpretation.  Additionally, the tumor cells are diffusely positive for p53 (clonal overexpression), suggestive of a high-grade serous carcinoma.    12/28/2021 Imaging   US pelvis 1. Persistent abnormal appearance of the endometrium, heterogeneously thickened and complex with possible intraluminal fluid. This is similar to that seen on December 2022 ultrasound. Endometrial thickness is considered abnormal for an asymptomatic post-menopausal female. Endometrial sampling should be considered to exclude carcinoma. 2. Nonvisualization of the ovaries.   12/28/2021 Imaging   1. Large volume ascites. 2. Small umbilical hernia containing fat and ascites. 3. Haziness of anterior  omentum most likely related to ascites, other etiologies such as metastatic disease can not be excluded. 4. Diffuse colonic diverticulosis without evidence for diverticulitis.   12/29/2021 Procedure   Successful ultrasound-guided paracentesis yielding 2.8 liters of peritoneal fluid.   01/14/2022 Initial Diagnosis   Uterine cancer ([place])   01/14/2022 Cancer Staging   Staging form: Corpus Uteri - Carcinoma and Carcinosarcoma, AJCC 8th Edition - Clinical: Stage III (cT3, cN0, cM0) - Signed by [name], MD on 01/14/2022 Stage prefix: Initial diagnosis    01/15/2022 Surgery   Surgery: Cervical dilation under ultrasound guidance, hysteroscopy, endometrial sampling using the Myosure   Surgeons:  Valarie Cones MD    Pathology: endometrial curetteings   Operative findings: On EUA, 8 cm mobile uterus, some nodularity along cul de sac. Cervix normal in appearance without discernible external os. Difficulty noted in dilating the cervix after using scalpel to incise in area of what what thought to be the external os. Under ultrasound guidance, cervix was dilated with confirmation on imaging of intra-uterine placement of dilators. On hysteroscopy, abnormal appearing endocervix and very calcified tissue obscuring good visualization of endometrium concerning for replacement by tumor.    01/15/2022 Pathology Results   FINAL MICROSCOPIC DIAGNOSIS:   A.   ENDOMETRIUM, CURETTAGE:  -    Serous endometrial carcinoma.   COMMENT:   The carcinoma is strongly and diffusely positive for p53 and diffuse p16 positivity.  The ER shows relatively diffuse positivity.  Overall, the histologic features (slit-like glandular spaces and psammoma bodies) and the immunohistochemical phenotype are most characteristic for a serous carcinoma.    01/21/2022 Procedure   Placement of single lumen port a cath via right internal jugular vein. The catheter tip lies at the cavo-atrial junction. A power injectable port a cath was placed  and is ready for immediate use.  01/25/2022 -  Chemotherapy   Patient is on Treatment Plan : UTERINE Carboplatin AUC 6 / Paclitaxel q21d      03/21/2022 Imaging   1. Near complete resolution of previously noted ascites, with only small volume residual perihepatic ascites. 2. Improved omental caking and stranding in the ventral abdomen. 3. Findings are consistent with treatment response of peritoneal metastatic disease. 4. Thickening of the urinary bladder wall , consistent with nonspecific infectious or inflammatory cystitis. Correlate with urinalysis.   Aortic Atherosclerosis (ICD10-I70.0).       03/22/2022 Tumor Marker   Patient's tumor was tested for the following markers: CA-125. Results of the tumor marker test revealed 21.5.     Interval History: Patient reports doing well.  She had increased difficulty after her last cycle of chemotherapy.  She was having significant lower extremity cramping and some difficulty with walking.  Things have improved since she increased how much magnesium she is taking each day.  She notes improving bowel function, has been using MiraLAX daily since last week.  She denies any urinary symptoms.  She denies any vaginal bleeding or discharge.  She notes pain has ceased since Easter.  Continues to have some fatigue.  Lost about 26 pounds from prior to starting treatment, weight appears to have stabilized.  Past Medical/Surgical History: Past Medical History:  Diagnosis Date   AKI (acute kidney injury) ([place])    History of   Ascites    09/2021 paracentesis with almost 3 L of fluid 12/2021 Paracentesis performed with 2.8 L   Chronic anemia 08/29/2012   Need colonoscopy or report. Need iron panel.    Dyslipidemia 06/30/2007   Dyspnea    Essential hypertension, benign 06/30/2007   GERD 06/30/2007   History of blood transfusion    History of fatty infiltration of liver    History of migraine    Hx of Herpes simplex meningitis 2015   Also noted to  have primary empty sella on imaging at this admission   Insomnia 06/11/2012   Major depressive disorder, recurrent episode, moderate with anxious distress ([place]) 06/30/2007   Nausea and vomiting 01/13/2022   Obesity, BMI 35-40 06/11/2012   Peripheral neuropathy 2/2 T2DM 12/28/2009   Primary empty sella syndrome ([place]) 2015   Noted on imaging during hospitalization for herpes meningitis; no pituitary mass, no hormone w/u at that time, hormonally asymptomatic   Type 2 diabetes mellitus with neurological complications ([place]) 03/70/4888    Past Surgical History:  Procedure Laterality Date   COLONOSCOPY W/ POLYPECTOMY  06/28/2004   DENTAL SURGERY     DILATATION & CURETTAGE/HYSTEROSCOPY WITH MYOSURE N/A 01/15/2022   Procedure: DILATATION & CURETTAGE/HYSTEROSCOPY WITH MYOSURE;  Surgeon: Lafonda Mosses, MD;  Location: [facility];  Service: Gynecology;  Laterality: N/A;  DO NOT OPEN HYSTEROSCOPY KIT   IR IMAGING GUIDED PORT INSERTION  01/18/2022   OPERATIVE ULTRASOUND N/A 01/15/2022   Procedure: OPERATIVE ULTRASOUND;  Surgeon: Lafonda Mosses, MD;  Location: [facility];  Service: Gynecology;  Laterality: N/A;   TUBAL LIGATION      Family History  Adopted: Yes  Problem Relation Age of Onset   Dementia Mother    Breast cancer Sister        paternal half-sister   Liver cancer Brother        maternal half-brother   Thyroid cancer Maternal Aunt    Prostate cancer Maternal Uncle    Prostate cancer Cousin    Breast cancer Other    Ovarian cancer Neg Hx  Colon cancer Neg Hx    Endometrial cancer Neg Hx    Pancreatic cancer Neg Hx     Social History   Socioeconomic History   Marital status: Single    Spouse name: Not on file   Number of children: Not on file   Years of education: 12   Highest education level: Not on file  Occupational History   Occupation: SALES    Employer: [employer]  Tobacco Use   Smoking status: Former    Types: Cigarettes    Quit date: 10/28/1978    Years since  quitting: 43.4   Smokeless tobacco: Never  Vaping Use   Vaping Use: Never used  Substance and Sexual Activity   Alcohol use: No    Alcohol/week: 0.0 standard drinks   Drug use: No   Sexual activity: Not Currently    Partners: Male    Birth control/protection: Post-menopausal  Other Topics Concern   Not on file  Social History Narrative   Not on file   Social Determinants of Health   Financial Resource Strain: Medium Risk   Difficulty of Paying Living Expenses: Somewhat hard  Food Insecurity: Not on file  Transportation Needs: Not on file  Physical Activity: Not on file  Stress: Stress Concern Present   Feeling of Stress : Very much  Social Connections: Not on file    Current Medications: Current Outpatient Medications on File Prior to Visit  Medication Sig Dispense Refill   acetaminophen (TYLENOL) 500 MG tablet Take 1,000 mg by mouth every 6 (six) hours as needed for mild pain.     albuterol (VENTOLIN HFA) 108 (90 Base) MCG/ACT inhaler Inhale 2 puffs into the lungs every 4 (four) hours as needed for wheezing or shortness of breath. 8 g 0   amLODipine (NORVASC) 10 MG tablet Take 1 tablet (10 mg total) by mouth at bedtime. 90 tablet 3   atorvastatin (LIPITOR) 20 MG tablet Take 1 tablet (20 mg total) by mouth at bedtime. 30 tablet 11   Blood Glucose Monitoring Suppl (ONE TOUCH ULTRA MINI) w/Device KIT Please use as directed. 1 each 0   Cholecalciferol 1000 units capsule Take 1 capsule (1,000 Units total) by mouth daily. 90 capsule 1   dexamethasone (DECADRON) 4 MG tablet Take 2 tablets by mouth the night before and 2 tablets the morning of chemotherapy, every 3 weeks for 6 cycles 36 tablet 6   dorzolamide-timolol (COSOPT) 22.3-6.8 MG/ML ophthalmic solution Place 1 drop into both eyes 2 (two) times daily.     glucose blood (ONE TOUCH ULTRA TEST) test strip Use as instructed 100 each 1   Insulin Pen Needle 32G X 4 MM MISC 1 each by Does not apply route daily. 100 each 3    latanoprost (XALATAN) 0.005 % ophthalmic solution Place 1 drop into both eyes at bedtime.     lidocaine-prilocaine (EMLA) cream Apply to affected area once 30 g 3   liraglutide (VICTOZA) 18 MG/3ML SOPN PLEASE START TAKING VICTOZA 0.6MG DAILY WEEK 1, THEN TAKE 1.2MG DAILY WEEK 2, AND THEN TAKE 1.8MG DAILY THEREAFTER (Patient taking differently: Inject 1.2 mg into the skin daily.) 9 mL 3   losartan (COZAAR) 100 MG tablet Take 100 mg by mouth daily.     ondansetron (ZOFRAN) 8 MG tablet Take 1 tablet (8 mg total) by mouth 2 (two) times daily as needed. Start on the third day after chemotherapy. 30 tablet 1   ONETOUCH DELICA LANCETS 69G MISC Please use as directed. 100 each  0   pantoprazole (PROTONIX) 40 MG tablet Take 1 tablet by mouth daily. 30 tablet 1   prochlorperazine (COMPAZINE) 10 MG tablet Take 1 tablet (10 mg total) by mouth every 6 (six) hours as needed (Nausea or vomiting). 30 tablet 1   magnesium oxide (MAG-OX) 400 (240 Mg) MG tablet Take 1 tablet by mouth 2 times daily. 60 tablet 1   No current facility-administered medications on file prior to visit.    Review of Systems: + Chills, fatigue, weight changes, abdominal pain, constipation, anxiety. Denies appetite changes, fevers. Denies hearing loss, neck lumps or masses, mouth sores, ringing in ears or voice changes. Denies cough or wheezing.  Denies shortness of breath. Denies chest pain or palpitations. Denies leg swelling. Denies abdominal distention, blood in stools, diarrhea, nausea, vomiting, or early satiety. Denies pain with intercourse, dysuria, frequency, hematuria or incontinence. Denies hot flashes, pelvic pain, vaginal bleeding or vaginal discharge.   Denies joint pain, back pain or muscle pain/cramps. Denies itching, rash, or wounds. Denies dizziness, headaches, numbness or seizures. Denies swollen lymph nodes or glands, denies easy bruising or bleeding. Denies depression, confusion, or decreased  concentration.  Physical Exam: BP (!) 142/72 (BP Location: Left Arm, Patient Position: Sitting)   Pulse 89   Temp 98 F (36.7 C) (Oral)   Resp 18   Ht 5' 3"  (1.6 m)   Wt 177 lb 8 oz (80.5 kg)   SpO2 100%   BMI 31.44 kg/m  General: Alert, oriented, no acute distress. HEENT: Normocephalic, atraumatic, sclera anicteric. Chest: Clear to auscultation bilaterally.  No wheezes or rhonchi. Cardiovascular: Regular rate and rhythm, no murmurs. Abdomen: soft, nontender.  Normoactive bowel sounds.  No masses or hepatosplenomegaly appreciated.  Questionable mild firmness just above the umbilicus in the midline. Extremities: Grossly normal range of motion.  Warm, well perfused.  No edema bilaterally. Skin: No rashes or lesions noted. Lymphatics: No cervical, supraclavicular, or inguinal adenopathy. GU: Normal appearing external genitalia without erythema, excoriation, or lesions.  Speculum exam reveals mildly atrophic cervix, no vaginal lesions.  No blood or discharge within the vaginal vault.  Bimanual exam reveals mobile, 10 cm uterus.  Cervix is smooth, not nodular or firm.    Laboratory & Radiologic Studies: 03/19/22: CT A/P -  1. Near complete resolution of previously noted ascites, with only small volume residual perihepatic ascites. 2. Improved omental caking and stranding in the ventral abdomen. 3. Findings are consistent with treatment response of peritoneal metastatic disease. 4. Thickening of the urinary bladder wall , consistent with nonspecific infectious or inflammatory cystitis. Correlate with urinalysis.  Component Ref Range & Units 8 d ago 2 mo ago  Cancer Antigen (CA) 125 0.0 - 38.1 U/mL 21.5  892.0 High  CM    Assessment & Plan: Sherri Gill is a 71 y.o. woman with advanced serous uterine cancer who presents for discussion of treatment planning.  The patient has overall had a response to neoadjuvant chemotherapy.  On my review of her CT scan, there still appears to be  some omental disease.  I discussed plan with her for diagnostic laparoscopy to evaluate extent of disease at the start of surgery.  If it appears feasible to perform at least a portion of the surgery robotically, then we will opt for this approach.  At minimum, the patient will require a mini laparotomy for intra-abdominal palpation.  If significant omental and/or peritoneal disease, then we will plan for surgery via laparotomy.  Given her hernia, we have discussed the  possibility of primary hernia repair.  Plan to repeat blood work next week given anemia and leukopenia.  I stressed the importance of good glycemic control in the perioperative.  To decrease the risk of surgical morbidity.  We reviewed the plan for a diagnostic laparoscopy with open versus robotic assisted hysterectomy, bilateral salpingo-oophorectomy, tumor debulking including omentectomy, possible primary hernia repair. The risks of surgery were discussed in detail and she understands these to include infection; wound separation; hernia; vaginal cuff separation, injury to adjacent organs such as bowel, bladder, blood vessels, ureters and nerves; bleeding which may require blood transfusion; anesthesia risk; thromboembolic events; possible death; unforeseen complications; possible need for re-exploration; medical complications such as heart attack, stroke, pleural effusion and pneumonia. The patient will receive DVT and antibiotic prophylaxis as indicated. Plan for 2 weeks of anticoagulation post-op if MIS and 4 weeks if laparotomy. She voiced a clear understanding. She had the opportunity to ask questions. Perioperative instructions were reviewed with her. Prescriptions for post-op medications were sent to her pharmacy of choice.  36 minutes of total time was spent for this patient encounter, including preparation, face-to-face counseling with the patient and coordination of care, and documentation of the encounter.  Jeral Pinch, MD   Division of Gynecologic Oncology  Department of Obstetrics and Gynecology  [facility]

## 2022-03-28 NOTE — Progress Notes (Addendum)
Patient here for follow up with Dr. Jeral Pinch and for a pre-operative discussion prior to her scheduled surgery on April 09, 2022. She is scheduled for diagnostic laparoscopy, possible robotic assisted laparoscopic total hysterectomy, bilateral salpingo-oophorectomy, debulking, mini laparotomy, omentectomy, possible hernia repair.  She has her pre-admission testing appointment on 6/9 at [facility]. The surgery was discussed in detail.  See after visit summary for additional details. Visual aids used to discuss items related to surgery including sequential compression stockings, foley catheter, IV pump, multi-modal pain regimen including tylenol, photo of the surgical robot, female reproductive system to discuss surgery in detail.      Discussed post-op pain management in detail including the aspects of the enhanced recovery pathway.  Advised her that a new prescription would be sent in for tramadol and it is only to be used for after her upcoming surgery.  We discussed the use of tylenol post-op and to monitor for a maximum of 4,000 mg in a 24 hour period.  Also prescribed sennakot to be used after surgery and to hold if having loose stools.  Discussed bowel regimen in detail.    Discussed the use of SCDs and measures to take at home to prevent DVT including frequent mobility.  Reportable signs and symptoms of DVT discussed. Post-operative instructions discussed and expectations for after surgery. Incisional care discussed as well including reportable signs and symptoms including erythema, drainage, wound separation.   We reviewed the need for DVT prop post-operatively for either 2 vs 4 weeks based on robotic vs open.    15 minutes spent with the patient.  Verbalizing understanding of material discussed. No needs or concerns voiced at the end of the visit.   Advised patient to call for any needs.  Advised that her post-operative medications had been prescribed and could be picked up at any time.     This appointment is included in the global surgical bundle as pre-operative teaching and has no charge.

## 2022-03-29 ENCOUNTER — Encounter: Payer: Self-pay | Admitting: Hematology and Oncology

## 2022-03-29 ENCOUNTER — Encounter: Payer: Self-pay | Admitting: Licensed Clinical Social Worker

## 2022-03-29 NOTE — Progress Notes (Signed)
[facility] CSW Progress Note  Clinical Education officer, museum contacted patient by phone to follow-up on coping. Pt reports doing well and feeling optimistic and ready for surgery after her appointment yesterday.  She also has been contacted by her peer mentor and found that connection to be very helpful. No coping concerns today.   Pt did request assistance with financial applications and will meet with CSW on Monday to complete them    Mounir Skipper E Anica Alcaraz, LCSW

## 2022-04-01 ENCOUNTER — Telehealth: Payer: Self-pay | Admitting: Licensed Clinical Social Worker

## 2022-04-01 ENCOUNTER — Inpatient Hospital Stay: Payer: Medicare HMO | Admitting: Licensed Clinical Social Worker

## 2022-04-01 NOTE — Telephone Encounter (Signed)
Sherri Gill Clinical Social Work  Attempted to contact pt by phone to follow-up on missed appt today. No answer. Unable to leave message as VM not set up.   Sherri Marxen E Zanovia Rotz, LCSW

## 2022-04-02 ENCOUNTER — Other Ambulatory Visit (HOSPITAL_COMMUNITY): Payer: Self-pay

## 2022-04-02 DIAGNOSIS — I10 Essential (primary) hypertension: Secondary | ICD-10-CM | POA: Diagnosis not present

## 2022-04-02 DIAGNOSIS — K219 Gastro-esophageal reflux disease without esophagitis: Secondary | ICD-10-CM | POA: Diagnosis not present

## 2022-04-02 DIAGNOSIS — D72829 Elevated white blood cell count, unspecified: Secondary | ICD-10-CM | POA: Diagnosis not present

## 2022-04-02 DIAGNOSIS — E1165 Type 2 diabetes mellitus with hyperglycemia: Secondary | ICD-10-CM | POA: Diagnosis not present

## 2022-04-02 DIAGNOSIS — Z87891 Personal history of nicotine dependence: Secondary | ICD-10-CM | POA: Diagnosis not present

## 2022-04-02 DIAGNOSIS — K5730 Diverticulosis of large intestine without perforation or abscess without bleeding: Secondary | ICD-10-CM | POA: Diagnosis not present

## 2022-04-02 DIAGNOSIS — Z9981 Dependence on supplemental oxygen: Secondary | ICD-10-CM | POA: Diagnosis not present

## 2022-04-02 DIAGNOSIS — R188 Other ascites: Secondary | ICD-10-CM | POA: Diagnosis not present

## 2022-04-02 DIAGNOSIS — Z86010 Personal history of colonic polyps: Secondary | ICD-10-CM | POA: Diagnosis not present

## 2022-04-02 DIAGNOSIS — Z7952 Long term (current) use of systemic steroids: Secondary | ICD-10-CM | POA: Diagnosis not present

## 2022-04-02 DIAGNOSIS — R69 Illness, unspecified: Secondary | ICD-10-CM | POA: Diagnosis not present

## 2022-04-02 DIAGNOSIS — E785 Hyperlipidemia, unspecified: Secondary | ICD-10-CM | POA: Diagnosis not present

## 2022-04-02 DIAGNOSIS — G4700 Insomnia, unspecified: Secondary | ICD-10-CM | POA: Diagnosis not present

## 2022-04-02 DIAGNOSIS — Z9851 Tubal ligation status: Secondary | ICD-10-CM | POA: Diagnosis not present

## 2022-04-02 DIAGNOSIS — E669 Obesity, unspecified: Secondary | ICD-10-CM | POA: Diagnosis not present

## 2022-04-02 DIAGNOSIS — E1142 Type 2 diabetes mellitus with diabetic polyneuropathy: Secondary | ICD-10-CM | POA: Diagnosis not present

## 2022-04-02 DIAGNOSIS — N179 Acute kidney failure, unspecified: Secondary | ICD-10-CM | POA: Diagnosis not present

## 2022-04-02 DIAGNOSIS — Z79899 Other long term (current) drug therapy: Secondary | ICD-10-CM | POA: Diagnosis not present

## 2022-04-02 DIAGNOSIS — R0600 Dyspnea, unspecified: Secondary | ICD-10-CM | POA: Diagnosis not present

## 2022-04-02 DIAGNOSIS — E236 Other disorders of pituitary gland: Secondary | ICD-10-CM | POA: Diagnosis not present

## 2022-04-02 DIAGNOSIS — D509 Iron deficiency anemia, unspecified: Secondary | ICD-10-CM | POA: Diagnosis not present

## 2022-04-02 DIAGNOSIS — K429 Umbilical hernia without obstruction or gangrene: Secondary | ICD-10-CM | POA: Diagnosis not present

## 2022-04-02 DIAGNOSIS — E871 Hypo-osmolality and hyponatremia: Secondary | ICD-10-CM | POA: Diagnosis not present

## 2022-04-02 DIAGNOSIS — Z6833 Body mass index (BMI) 33.0-33.9, adult: Secondary | ICD-10-CM | POA: Diagnosis not present

## 2022-04-04 NOTE — Patient Instructions (Signed)
DUE TO COVID-19 ONLY TWO VISITORS  (aged 69 and older)  ARE ALLOWED TO COME WITH YOU AND STAY IN THE WAITING ROOM ONLY DURING PRE OP AND PROCEDURE.   **NO VISITORS ARE ALLOWED IN THE SHORT STAY AREA OR RECOVERY ROOM!!**  IF YOU WILL BE ADMITTED INTO THE HOSPITAL YOU ARE ALLOWED ONLY FOUR SUPPORT PEOPLE DURING VISITATION HOURS ONLY (7 AM -8PM)   The support person(s) must pass our screening, gel in and out, and wear a mask at all times, including in the patient's room. Patients must also wear a mask when staff or their support person are in the room. Visitors GUEST BADGE MUST BE WORN VISIBLY  One adult visitor may remain with you overnight and MUST be in the room by 8 P.M.     Your procedure is scheduled on: 04/09/22   Report to [facility] Main Entrance    Report to admitting at   5:15 AM   Call this number if you have problems the morning of surgery [phone]  Light diet on 04/08/22  Light Diet- Full liquid diet  Strained creamy soups Tea, Coffee- with cream or mild and sugar or honey  Juices- cranberry , grape and apple  Jello  Milkshakes  Pudding , custards  Popsicles  Water Plain ice cream f, frozen yogurt, sherbet, plain yogurt  Fruit ices and popsicles with no fruit pulp  Sugar, honey and syrups Clear broths  Boost, Ensure, Resource and other liquid supplements NO CARBONATED BEVERAGES      Do not eat food :After Midnight.   After Midnight you may have the following liquids until __4:30____ AM/  DAY OF SURGERY  Water Black Coffee (sugar ok, NO MILK/CREAM OR CREAMERS)  Tea (sugar ok, NO MILK/CREAM OR CREAMERS) regular and decaf                             Plain Jell-O (NO RED)                                           Fruit ices (not with fruit pulp, NO RED)                                     Popsicles (NO RED)                                                                  Juice: apple, WHITE grape, WHITE cranberry Sports drinks like Gatorade (NO  RED) Clear broth(vegetable,chicken,beef)                   The day of surgery:  Drink ONE (1)  G2 at  4:15 AM the morning of surgery. Drink in one sitting. Do not sip.  This drink was given to you during your hospital  pre-op appointment visit. Nothing else to drink after completing the   G2. At 4:30 AM          If you have questions, please contact your surgeon's office.   FOLLOW BOWEL PREP  AND ANY ADDITIONAL PRE OP INSTRUCTIONS YOU RECEIVED FROM YOUR SURGEON'S OFFICE!!!     Oral Hygiene is also important to reduce your risk of infection.                                    Remember - BRUSH YOUR TEETH THE MORNING OF SURGERY WITH YOUR REGULAR TOOTHPASTE   Do NOT smoke after Midnight   Take these medicines the morning of surgery with A SIP OF WATER: Pantoprazole, Use your inhaler and bring it with you.  DO NOT TAKE ANY ORAL DIABETIC MEDICATIONS DAY OF YOUR SURGERY( Metformin) .                              You may not have any metal on your body including hair pins, jewelry, and body piercing             Do not wear make-up, lotions, powders, perfumes/cologne, or deodorant  Do not wear nail polish including gel and S&S, artificial/acrylic nails, or any other type of covering on natural nails including finger and toenails. If you have artificial nails, gel coating, etc. that needs to be removed by a nail salon please have this removed prior to surgery or surgery may need to be canceled/ delayed if the surgeon/ anesthesia feels like they are unable to be safely monitored.   Do not shave  48 hours prior to surgery.     Do not bring valuables to the hospital. [place].   Contacts, dentures or bridgework may not be worn into surgery.   Bring small overnight bag day of surgery.   DO NOT [place]. PHARMACY WILL DISPENSE MEDICATIONS LISTED ON YOUR MEDICATION LIST TO YOU DURING YOUR ADMISSION [place]!     Special Instructions: Bring a copy of your healthcare power of attorney and living will documents the day of surgery if you haven't scanned them before.              Please read over the following fact sheets you were given: IF YOU HAVE QUESTIONS ABOUT YOUR PRE-OP INSTRUCTIONS PLEASE CALL [phone]     [facility] - Preparing for Surgery Before surgery, you can play an important role.  Because skin is not sterile, your skin needs to be as free of germs as possible.  You can reduce the number of germs on your skin by washing with CHG (chlorahexidine gluconate) soap before surgery.  CHG is an antiseptic cleaner which kills germs and bonds with the skin to continue killing germs even after washing. Please DO NOT use if you have an allergy to CHG or antibacterial soaps.  If your skin becomes reddened/irritated stop using the CHG and inform your nurse when you arrive at Short Stay.  You may shave your face/neck. Please follow these instructions carefully:  1.  Shower with CHG Soap the night before surgery and the  morning of Surgery.  2.  If you choose to wash your hair, wash your hair first as usual with your  normal  shampoo.  3.  After you shampoo, rinse your hair and body thoroughly to remove the  shampoo.  4.  Use CHG as you would any other liquid soap.  You can apply chg directly  to the skin and wash                       Gently with a scrungie or clean washcloth.  5.  Apply the CHG Soap to your body ONLY FROM THE NECK DOWN.   Do not use on face/ open                           Wound or open sores. Avoid contact with eyes, ears mouth and genitals (private parts).                       Wash face,  Genitals (private parts) with your normal soap.             6.  Wash thoroughly, paying special attention to the area where your surgery  will be performed.  7.  Thoroughly rinse your body with warm water from the neck down.  8.  DO NOT shower/wash with  your normal soap after using and rinsing off  the CHG Soap.                9.  Pat yourself dry with a clean towel.            10.  Wear clean pajamas.            11.  Place clean sheets on your bed the night of your first shower and do not  sleep with pets. Day of Surgery : Do not apply any lotions/deodorants the morning of surgery.  Please wear clean clothes to the hospital/surgery center.  FAILURE TO FOLLOW THESE INSTRUCTIONS MAY RESULT IN THE CANCELLATION OF YOUR SURGERY   ________________________________________________________________________

## 2022-04-05 ENCOUNTER — Other Ambulatory Visit (HOSPITAL_COMMUNITY): Payer: Self-pay

## 2022-04-05 ENCOUNTER — Other Ambulatory Visit: Payer: Self-pay

## 2022-04-05 ENCOUNTER — Encounter (HOSPITAL_COMMUNITY)
Admission: RE | Admit: 2022-04-05 | Discharge: 2022-04-05 | Disposition: A | Discharge status: Home / Self Care | Payer: Medicare HMO | Source: Ambulatory Visit | Attending: Gynecologic Oncology | Admitting: Gynecologic Oncology

## 2022-04-05 ENCOUNTER — Encounter (HOSPITAL_COMMUNITY): Payer: Self-pay

## 2022-04-05 VITALS — BP 149/76 | HR 83 | Temp 98.1°F | Resp 18 | Ht 63.5 in | Wt 176.0 lb

## 2022-04-05 DIAGNOSIS — E1149 Type 2 diabetes mellitus with other diabetic neurological complication: Secondary | ICD-10-CM | POA: Diagnosis not present

## 2022-04-05 DIAGNOSIS — C579 Malignant neoplasm of female genital organ, unspecified: Secondary | ICD-10-CM | POA: Diagnosis not present

## 2022-04-05 DIAGNOSIS — Z01812 Encounter for preprocedural laboratory examination: Secondary | ICD-10-CM | POA: Insufficient documentation

## 2022-04-05 LAB — COMPREHENSIVE METABOLIC PANEL
ALT: 11 U/L (ref 0–44)
AST: 13 U/L — ABNORMAL LOW (ref 15–41)
Albumin: 4.2 g/dL (ref 3.5–5.0)
Alkaline Phosphatase: 138 U/L — ABNORMAL HIGH (ref 38–126)
Anion gap: 11 (ref 5–15)
BUN: 16 mg/dL (ref 8–23)
CO2: 24 mmol/L (ref 22–32)
Calcium: 10 mg/dL (ref 8.9–10.3)
Chloride: 103 mmol/L (ref 98–111)
Creatinine, Ser: 0.76 mg/dL (ref 0.44–1.00)
GFR, Estimated: >60 mL/min (ref >60)
Glucose, Bld: 88 mg/dL (ref 70–99)
Potassium: 3.7 mmol/L (ref 3.5–5.1)
Sodium: 138 mmol/L (ref 135–145)
Total Bilirubin: 0.5 mg/dL (ref 0.3–1.2)
Total Protein: 7.3 g/dL (ref 6.5–8.1)

## 2022-04-05 LAB — CBC WITH DIFFERENTIAL/PLATELET
Abs Immature Granulocytes: 0.02 10*3/uL (ref 0.00–0.07)
Basophils Absolute: 0 10*3/uL (ref 0.0–0.1)
Basophils Relative: 0 %
Eosinophils Absolute: 0.1 10*3/uL (ref 0.0–0.5)
Eosinophils Relative: 1 %
HCT: 30.3 % — ABNORMAL LOW (ref 36.0–46.0)
Hemoglobin: 9.3 g/dL — ABNORMAL LOW (ref 12.0–15.0)
Immature Granulocytes: 0 %
Lymphocytes Relative: 28 %
Lymphs Abs: 2 10*3/uL (ref 0.7–4.0)
MCH: 26.2 pg (ref 26.0–34.0)
MCHC: 30.7 g/dL (ref 30.0–36.0)
MCV: 85.4 fL (ref 80.0–100.0)
Monocytes Absolute: 0.6 10*3/uL (ref 0.1–1.0)
Monocytes Relative: 9 %
Neutro Abs: 4.3 10*3/uL (ref 1.7–7.7)
Neutrophils Relative %: 62 %
Platelets: 299 10*3/uL (ref 150–400)
RBC: 3.55 MIL/uL — ABNORMAL LOW (ref 3.87–5.11)
RDW: 25.2 % — ABNORMAL HIGH (ref 11.5–15.5)
WBC: 7.1 10*3/uL (ref 4.0–10.5)
nRBC: 0 % (ref 0.0–0.2)

## 2022-04-05 LAB — HEMOGLOBIN A1C
Hgb A1c MFr Bld: 6.5 % — ABNORMAL HIGH (ref 4.8–5.6)
Mean Plasma Glucose: 139.85 mg/dL

## 2022-04-05 LAB — GLUCOSE, CAPILLARY
Glucose-Capillary: 69 mg/dL — ABNORMAL LOW (ref 70–99)
Glucose-Capillary: 96 mg/dL (ref 70–99)

## 2022-04-05 NOTE — Progress Notes (Signed)
Anesthesia note:  Bowel prep reminder:none light diet day before surgery  PCP - Dr. Lenise Herald Cardiologist -none Other-   Chest x-ray - 12/30/21-epic EKG - 12/31/21-epic Stress Test - no ECHO - no Cardiac Cath - NA  Pacemaker/ICD device last checked:NA  Sleep Study - no CPAP -   Fasting Blood Sugar - 119-150 Checks Blood Sugar __QD___  Blood Thinner:NA Blood Thinner Instructions: Aspirin Instructions: Last Dose:  Anesthesia review: yes  Patient denies shortness of breath, fever, cough and chest pain at PAT appointment Pt' SOB has resolved after taking 12lbs of fluid from her abd. She had som peripheral edema at the PAT visit.  Her CBG was 69 at the beginning of the visit. I gave her 4 oz of orange juice. At the end of the visit she was 96.Her dauhjter and her will go and get some lunch.  Patient verbalized understanding of instructions that were given to them at the PAT appointment. Patient was also instructed that they will need to review over the PAT instructions again at home before surgery. Yes

## 2022-04-08 ENCOUNTER — Telehealth: Payer: Self-pay | Admitting: *Deleted

## 2022-04-08 NOTE — Telephone Encounter (Signed)
Attempted to call pt and reviewed pre op information. Unable to get a hold of pt. Unable to LVM at this time.

## 2022-04-08 NOTE — Telephone Encounter (Signed)
Telephone call to check on pre-operative status.  Patient compliant with pre-operative instructions.  Reinforced nothing to eat after midnight. Clear liquids until 0430. Patient to arrive at 0515.  No questions or concerns voiced.  Instructed to call for any needs.  ?

## 2022-04-08 NOTE — Anesthesia Preprocedure Evaluation (Addendum)
Anesthesia Evaluation  Patient identified by MRN, date of birth, ID band Patient awake    Reviewed: Allergy & Precautions, NPO status , Patient's Chart, lab work & pertinent test results  Airway Mallampati: II  TM Distance: >3 FB Neck ROM: Full    Dental  (+) Teeth Intact, Dental Advisory Given   Pulmonary former smoker,    Pulmonary exam normal breath sounds clear to auscultation       Cardiovascular hypertension, Pt. on medications Normal cardiovascular exam Rhythm:Regular Rate:Normal     Neuro/Psych PSYCHIATRIC DISORDERS Anxiety Depression  Neuromuscular disease    GI/Hepatic GERD  Medicated,(+)   ascites    ,   Endo/Other  diabetes, Type 2, Oral Hypoglycemic AgentsObesity   Renal/GU negative Renal ROS     Musculoskeletal negative musculoskeletal ROS (+)   Abdominal   Peds  Hematology  (+) Blood dyscrasia, anemia ,   Anesthesia Other Findings   Reproductive/Obstetrics ADVANCED GYN MALIGNANCY                            Anesthesia Physical Anesthesia Plan  ASA: 4  Anesthesia Plan: General   Post-op Pain Management: Tylenol PO (pre-op)*   Induction: Intravenous  PONV Risk Score and Plan: 4 or greater and Midazolam, Scopolamine patch - Pre-op, Ondansetron and Dexamethasone  Airway Management Planned: Oral ETT  Additional Equipment:   Intra-op Plan:   Post-operative Plan: Extubation in OR  Informed Consent: I have reviewed the patients History and Physical, chart, labs and discussed the procedure including the risks, benefits and alternatives for the proposed anesthesia with the patient or authorized representative who has indicated his/her understanding and acceptance.     Dental advisory given  Plan Discussed with: CRNA  Anesthesia Plan Comments: (Consent for post-op TAP blocks, 2nd PIV after induction)       Anesthesia Quick Evaluation

## 2022-04-09 ENCOUNTER — Inpatient Hospital Stay (HOSPITAL_COMMUNITY): Payer: Medicare HMO

## 2022-04-09 ENCOUNTER — Other Ambulatory Visit: Payer: Self-pay

## 2022-04-09 ENCOUNTER — Inpatient Hospital Stay (HOSPITAL_COMMUNITY)
Admission: RE | Admit: 2022-04-09 | Discharge: 2022-04-15 | DRG: 740 | Disposition: A | Discharge status: Home Health | Payer: Medicare HMO | Attending: Gynecologic Oncology | Admitting: Gynecologic Oncology

## 2022-04-09 ENCOUNTER — Encounter (HOSPITAL_COMMUNITY)
Admission: RE | Disposition: A | Discharge status: Home Health | Payer: Self-pay | Source: Home / Self Care | Attending: Gynecologic Oncology

## 2022-04-09 ENCOUNTER — Encounter (HOSPITAL_COMMUNITY): Payer: Self-pay | Admitting: Gynecologic Oncology

## 2022-04-09 ENCOUNTER — Inpatient Hospital Stay (HOSPITAL_COMMUNITY): Payer: Medicare HMO | Admitting: Anesthesiology

## 2022-04-09 DIAGNOSIS — K660 Peritoneal adhesions (postprocedural) (postinfection): Secondary | ICD-10-CM | POA: Diagnosis not present

## 2022-04-09 DIAGNOSIS — C579 Malignant neoplasm of female genital organ, unspecified: Secondary | ICD-10-CM

## 2022-04-09 DIAGNOSIS — Z87891 Personal history of nicotine dependence: Secondary | ICD-10-CM

## 2022-04-09 DIAGNOSIS — F419 Anxiety disorder, unspecified: Secondary | ICD-10-CM | POA: Diagnosis not present

## 2022-04-09 DIAGNOSIS — D72829 Elevated white blood cell count, unspecified: Secondary | ICD-10-CM | POA: Diagnosis not present

## 2022-04-09 DIAGNOSIS — I4891 Unspecified atrial fibrillation: Secondary | ICD-10-CM | POA: Diagnosis not present

## 2022-04-09 DIAGNOSIS — G8918 Other acute postprocedural pain: Principal | ICD-10-CM

## 2022-04-09 DIAGNOSIS — Z6833 Body mass index (BMI) 33.0-33.9, adult: Secondary | ICD-10-CM | POA: Diagnosis not present

## 2022-04-09 DIAGNOSIS — N179 Acute kidney failure, unspecified: Secondary | ICD-10-CM | POA: Diagnosis not present

## 2022-04-09 DIAGNOSIS — E1169 Type 2 diabetes mellitus with other specified complication: Secondary | ICD-10-CM | POA: Diagnosis present

## 2022-04-09 DIAGNOSIS — K219 Gastro-esophageal reflux disease without esophagitis: Secondary | ICD-10-CM | POA: Diagnosis not present

## 2022-04-09 DIAGNOSIS — E1149 Type 2 diabetes mellitus with other diabetic neurological complication: Secondary | ICD-10-CM | POA: Diagnosis present

## 2022-04-09 DIAGNOSIS — Z5331 Laparoscopic surgical procedure converted to open procedure: Secondary | ICD-10-CM

## 2022-04-09 DIAGNOSIS — I10 Essential (primary) hypertension: Secondary | ICD-10-CM

## 2022-04-09 DIAGNOSIS — E876 Hypokalemia: Secondary | ICD-10-CM

## 2022-04-09 DIAGNOSIS — E785 Hyperlipidemia, unspecified: Secondary | ICD-10-CM | POA: Diagnosis present

## 2022-04-09 DIAGNOSIS — C786 Secondary malignant neoplasm of retroperitoneum and peritoneum: Secondary | ICD-10-CM | POA: Diagnosis not present

## 2022-04-09 DIAGNOSIS — C785 Secondary malignant neoplasm of large intestine and rectum: Secondary | ICD-10-CM | POA: Diagnosis not present

## 2022-04-09 DIAGNOSIS — Z880 Allergy status to penicillin: Secondary | ICD-10-CM

## 2022-04-09 DIAGNOSIS — E669 Obesity, unspecified: Secondary | ICD-10-CM | POA: Diagnosis not present

## 2022-04-09 DIAGNOSIS — R7989 Other specified abnormal findings of blood chemistry: Secondary | ICD-10-CM | POA: Diagnosis present

## 2022-04-09 DIAGNOSIS — Z7984 Long term (current) use of oral hypoglycemic drugs: Secondary | ICD-10-CM

## 2022-04-09 DIAGNOSIS — I480 Paroxysmal atrial fibrillation: Secondary | ICD-10-CM | POA: Diagnosis not present

## 2022-04-09 DIAGNOSIS — R946 Abnormal results of thyroid function studies: Secondary | ICD-10-CM | POA: Diagnosis present

## 2022-04-09 DIAGNOSIS — Z800 Family history of malignant neoplasm of digestive organs: Secondary | ICD-10-CM

## 2022-04-09 DIAGNOSIS — C784 Secondary malignant neoplasm of small intestine: Secondary | ICD-10-CM | POA: Diagnosis present

## 2022-04-09 DIAGNOSIS — C7982 Secondary malignant neoplasm of genital organs: Secondary | ICD-10-CM | POA: Diagnosis not present

## 2022-04-09 DIAGNOSIS — D62 Acute posthemorrhagic anemia: Secondary | ICD-10-CM | POA: Diagnosis not present

## 2022-04-09 DIAGNOSIS — C55 Malignant neoplasm of uterus, part unspecified: Secondary | ICD-10-CM

## 2022-04-09 DIAGNOSIS — Z808 Family history of malignant neoplasm of other organs or systems: Secondary | ICD-10-CM

## 2022-04-09 DIAGNOSIS — D509 Iron deficiency anemia, unspecified: Secondary | ICD-10-CM | POA: Diagnosis present

## 2022-04-09 DIAGNOSIS — D259 Leiomyoma of uterus, unspecified: Secondary | ICD-10-CM | POA: Diagnosis not present

## 2022-04-09 DIAGNOSIS — D508 Other iron deficiency anemias: Secondary | ICD-10-CM | POA: Diagnosis not present

## 2022-04-09 DIAGNOSIS — C548 Malignant neoplasm of overlapping sites of corpus uteri: Secondary | ICD-10-CM | POA: Diagnosis not present

## 2022-04-09 DIAGNOSIS — H409 Unspecified glaucoma: Secondary | ICD-10-CM

## 2022-04-09 DIAGNOSIS — E119 Type 2 diabetes mellitus without complications: Secondary | ICD-10-CM

## 2022-04-09 DIAGNOSIS — Z79899 Other long term (current) drug therapy: Secondary | ICD-10-CM

## 2022-04-09 DIAGNOSIS — Z8661 Personal history of infections of the central nervous system: Secondary | ICD-10-CM

## 2022-04-09 DIAGNOSIS — N736 Female pelvic peritoneal adhesions (postinfective): Secondary | ICD-10-CM | POA: Diagnosis not present

## 2022-04-09 DIAGNOSIS — Z885 Allergy status to narcotic agent status: Secondary | ICD-10-CM

## 2022-04-09 DIAGNOSIS — C7963 Secondary malignant neoplasm of bilateral ovaries: Secondary | ICD-10-CM | POA: Diagnosis not present

## 2022-04-09 DIAGNOSIS — K5900 Constipation, unspecified: Secondary | ICD-10-CM

## 2022-04-09 DIAGNOSIS — Z803 Family history of malignant neoplasm of breast: Secondary | ICD-10-CM

## 2022-04-09 DIAGNOSIS — C541 Malignant neoplasm of endometrium: Secondary | ICD-10-CM | POA: Diagnosis not present

## 2022-04-09 DIAGNOSIS — D500 Iron deficiency anemia secondary to blood loss (chronic): Secondary | ICD-10-CM | POA: Diagnosis not present

## 2022-04-09 DIAGNOSIS — Z888 Allergy status to other drugs, medicaments and biological substances status: Secondary | ICD-10-CM

## 2022-04-09 DIAGNOSIS — Z86010 Personal history of colonic polyps: Secondary | ICD-10-CM

## 2022-04-09 HISTORY — PX: HYSTERECTOMY ABDOMINAL WITH SALPINGO-OOPHORECTOMY: SHX6792

## 2022-04-09 HISTORY — PX: LAPAROSCOPY: SHX197

## 2022-04-09 LAB — BASIC METABOLIC PANEL
Anion gap: 9 (ref 5–15)
BUN: 17 mg/dL (ref 8–23)
CO2: 25 mmol/L (ref 22–32)
Calcium: 9.1 mg/dL (ref 8.9–10.3)
Chloride: 105 mmol/L (ref 98–111)
Creatinine, Ser: 0.79 mg/dL (ref 0.44–1.00)
GFR, Estimated: >60 mL/min (ref >60)
Glucose, Bld: 170 mg/dL — ABNORMAL HIGH (ref 70–99)
Potassium: 4.1 mmol/L (ref 3.5–5.1)
Sodium: 139 mmol/L (ref 135–145)

## 2022-04-09 LAB — URINALYSIS, ROUTINE W REFLEX MICROSCOPIC
Bilirubin Urine: NEGATIVE
Glucose, UA: NEGATIVE mg/dL
Hgb urine dipstick: NEGATIVE
Ketones, ur: NEGATIVE mg/dL
Leukocytes,Ua: NEGATIVE
Nitrite: NEGATIVE
Protein, ur: NEGATIVE mg/dL
Specific Gravity, Urine: 1.006 (ref 1.005–1.030)
pH: 7 (ref 5.0–8.0)

## 2022-04-09 LAB — CBC
HCT: 30 % — ABNORMAL LOW (ref 36.0–46.0)
Hemoglobin: 9.7 g/dL — ABNORMAL LOW (ref 12.0–15.0)
MCH: 27.2 pg (ref 26.0–34.0)
MCHC: 32.3 g/dL (ref 30.0–36.0)
MCV: 84.3 fL (ref 80.0–100.0)
Platelets: 327 10*3/uL (ref 150–400)
RBC: 3.56 MIL/uL — ABNORMAL LOW (ref 3.87–5.11)
RDW: 23.2 % — ABNORMAL HIGH (ref 11.5–15.5)
WBC: 9 10*3/uL (ref 4.0–10.5)
nRBC: 0 % (ref 0.0–0.2)

## 2022-04-09 LAB — GLUCOSE, CAPILLARY
Glucose-Capillary: 102 mg/dL — ABNORMAL HIGH (ref 70–99)
Glucose-Capillary: 152 mg/dL — ABNORMAL HIGH (ref 70–99)
Glucose-Capillary: 176 mg/dL — ABNORMAL HIGH (ref 70–99)
Glucose-Capillary: 90 mg/dL (ref 70–99)

## 2022-04-09 LAB — PREPARE RBC (CROSSMATCH)

## 2022-04-09 SURGERY — LAPAROSCOPY, DIAGNOSTIC
Anesthesia: General

## 2022-04-09 MED ORDER — LIRAGLUTIDE 18 MG/3ML ~~LOC~~ SOPN: 1.2000 mg | PEN_INJECTOR | Freq: Every day | SUBCUTANEOUS | Status: DC

## 2022-04-09 MED ORDER — ROCURONIUM BROMIDE 10 MG/ML (PF) SYRINGE
PREFILLED_SYRINGE | INTRAVENOUS | Status: AC
  Filled 2022-04-09: qty 10

## 2022-04-09 MED ORDER — CHLORHEXIDINE GLUCONATE 0.12 % MT SOLN
15.0000 mL | Freq: Once | OROMUCOSAL | Status: AC
  Administered 2022-04-09: 15 mL via OROMUCOSAL

## 2022-04-09 MED ORDER — INSULIN ASPART 100 UNIT/ML IJ SOLN
0.0000 [IU] | Freq: Three times a day (TID) | INTRAMUSCULAR | Status: DC
  Administered 2022-04-09 – 2022-04-10 (×3): 3 [IU] via SUBCUTANEOUS
  Administered 2022-04-10 – 2022-04-12 (×4): 2 [IU] via SUBCUTANEOUS

## 2022-04-09 MED ORDER — ORAL CARE MOUTH RINSE: 15.0000 mL | Freq: Once | OROMUCOSAL | Status: AC

## 2022-04-09 MED ORDER — SENNOSIDES-DOCUSATE SODIUM 8.6-50 MG PO TABS
2.0000 | ORAL_TABLET | Freq: Every day | ORAL | Status: DC
  Administered 2022-04-09 – 2022-04-14 (×6): 2 via ORAL
  Filled 2022-04-09 (×6): qty 2

## 2022-04-09 MED ORDER — BUPIVACAINE LIPOSOME 1.3 % IJ SUSP
INTRAMUSCULAR | Status: AC
Start: 2022-04-09 — End: ?
  Filled 2022-04-09: qty 20

## 2022-04-09 MED ORDER — KETAMINE HCL 10 MG/ML IJ SOLN
INTRAMUSCULAR | Status: DC | PRN
  Administered 2022-04-09: 20 mg via INTRAVENOUS
  Administered 2022-04-09: 10 mg via INTRAVENOUS
  Administered 2022-04-09: 30 mg via INTRAVENOUS
  Administered 2022-04-09 (×2): 10 mg via INTRAVENOUS

## 2022-04-09 MED ORDER — ATORVASTATIN CALCIUM 20 MG PO TABS
20.0000 mg | ORAL_TABLET | Freq: Every day | ORAL | Status: DC
  Administered 2022-04-09 – 2022-04-14 (×6): 20 mg via ORAL
  Filled 2022-04-09 (×6): qty 1

## 2022-04-09 MED ORDER — ACETAMINOPHEN 500 MG PO TABS
1000.0000 mg | ORAL_TABLET | ORAL | Status: AC
  Administered 2022-04-09: 1000 mg via ORAL
  Filled 2022-04-09: qty 2

## 2022-04-09 MED ORDER — CHLORHEXIDINE GLUCONATE CLOTH 2 % EX PADS
6.0000 | MEDICATED_PAD | Freq: Every day | CUTANEOUS | Status: DC
  Administered 2022-04-10 – 2022-04-15 (×7): 6 via TOPICAL

## 2022-04-09 MED ORDER — DEXAMETHASONE SODIUM PHOSPHATE 4 MG/ML IJ SOLN: 4.0000 mg | INTRAMUSCULAR | Status: DC

## 2022-04-09 MED ORDER — INSULIN ASPART 100 UNIT/ML IJ SOLN: 0.0000 [IU] | Freq: Every day | INTRAMUSCULAR | Status: DC

## 2022-04-09 MED ORDER — SODIUM CHLORIDE 0.9% FLUSH: 10.0000 mL | INTRAVENOUS | Status: DC | PRN

## 2022-04-09 MED ORDER — ONDANSETRON HCL 4 MG/2ML IJ SOLN
4.0000 mg | Freq: Four times a day (QID) | INTRAMUSCULAR | Status: DC | PRN
  Administered 2022-04-09: 4 mg via INTRAVENOUS
  Filled 2022-04-09: qty 2

## 2022-04-09 MED ORDER — PHENYLEPHRINE HCL (PRESSORS) 10 MG/ML IV SOLN
INTRAVENOUS | Status: AC
  Filled 2022-04-09: qty 1

## 2022-04-09 MED ORDER — BUPIVACAINE HCL 0.25 % IJ SOLN
INTRAMUSCULAR | Status: AC
Start: 2022-04-09 — End: ?
  Filled 2022-04-09: qty 1

## 2022-04-09 MED ORDER — HYDROMORPHONE HCL 1 MG/ML IJ SOLN
0.5000 mg | INTRAMUSCULAR | Status: DC | PRN
  Administered 2022-04-09 – 2022-04-11 (×6): 0.5 mg via INTRAVENOUS
  Filled 2022-04-09 (×6): qty 0.5

## 2022-04-09 MED ORDER — STERILE WATER FOR IRRIGATION IR SOLN
Status: DC | PRN
  Administered 2022-04-09: 1000 mL

## 2022-04-09 MED ORDER — LACTATED RINGERS IV SOLN: INTRAVENOUS | Status: DC | PRN

## 2022-04-09 MED ORDER — SODIUM CHLORIDE 0.9 % IV SOLN: 10.0000 mL/h | Freq: Once | INTRAVENOUS | Status: DC

## 2022-04-09 MED ORDER — LACTATED RINGERS IV SOLN: INTRAVENOUS | Status: DC

## 2022-04-09 MED ORDER — SODIUM CHLORIDE 0.9% FLUSH
10.0000 mL | Freq: Two times a day (BID) | INTRAVENOUS | Status: DC
  Administered 2022-04-12 – 2022-04-14 (×3): 10 mL

## 2022-04-09 MED ORDER — FENTANYL CITRATE PF 50 MCG/ML IJ SOSY
25.0000 ug | PREFILLED_SYRINGE | INTRAMUSCULAR | Status: DC | PRN
  Administered 2022-04-09 (×2): 50 ug via INTRAVENOUS

## 2022-04-09 MED ORDER — DEXAMETHASONE SODIUM PHOSPHATE 10 MG/ML IJ SOLN
INTRAMUSCULAR | Status: DC | PRN
  Administered 2022-04-09: 10 mg via INTRAVENOUS

## 2022-04-09 MED ORDER — FENTANYL CITRATE (PF) 250 MCG/5ML IJ SOLN
INTRAMUSCULAR | Status: DC | PRN
Start: 2022-04-09 — End: 2022-04-09
  Administered 2022-04-09 (×2): 50 ug via INTRAVENOUS
  Administered 2022-04-09: 100 ug via INTRAVENOUS
  Administered 2022-04-09 (×3): 50 ug via INTRAVENOUS

## 2022-04-09 MED ORDER — SURGIFLO WITH THROMBIN (HEMOSTATIC MATRIX KIT) OPTIME
TOPICAL | Status: DC | PRN
  Administered 2022-04-09 (×2): 1 via TOPICAL

## 2022-04-09 MED ORDER — SODIUM CHLORIDE 0.9 % IV SOLN: INTRAVENOUS | Status: DC | PRN

## 2022-04-09 MED ORDER — IBUPROFEN 200 MG PO TABS
600.0000 mg | ORAL_TABLET | Freq: Four times a day (QID) | ORAL | Status: DC
  Administered 2022-04-10 – 2022-04-15 (×20): 600 mg via ORAL
  Filled 2022-04-09 (×2): qty 3
  Filled 2022-04-09 (×3): qty 1
  Filled 2022-04-09 (×2): qty 3
  Filled 2022-04-09 (×4): qty 1
  Filled 2022-04-09 (×4): qty 3
  Filled 2022-04-09: qty 1
  Filled 2022-04-09 (×2): qty 3
  Filled 2022-04-09: qty 1
  Filled 2022-04-09: qty 3

## 2022-04-09 MED ORDER — ONDANSETRON HCL 4 MG/2ML IJ SOLN: 4.0000 mg | Freq: Once | INTRAMUSCULAR | Status: DC | PRN

## 2022-04-09 MED ORDER — SUGAMMADEX SODIUM 200 MG/2ML IV SOLN
INTRAVENOUS | Status: DC | PRN
  Administered 2022-04-09: 200 mg via INTRAVENOUS

## 2022-04-09 MED ORDER — 0.9 % SODIUM CHLORIDE (POUR BTL) OPTIME
TOPICAL | Status: DC | PRN
  Administered 2022-04-09: 2000 mL
  Administered 2022-04-09: 1000 mL

## 2022-04-09 MED ORDER — ONDANSETRON HCL 4 MG/2ML IJ SOLN
INTRAMUSCULAR | Status: AC
  Filled 2022-04-09: qty 2

## 2022-04-09 MED ORDER — LIDOCAINE HCL (PF) 2 % IJ SOLN
INTRAMUSCULAR | Status: AC
  Filled 2022-04-09: qty 5

## 2022-04-09 MED ORDER — PHENYLEPHRINE HCL-NACL 20-0.9 MG/250ML-% IV SOLN
INTRAVENOUS | Status: DC | PRN
  Administered 2022-04-09: 50 ug/min via INTRAVENOUS

## 2022-04-09 MED ORDER — OXYCODONE HCL 5 MG PO TABS: 5.0000 mg | ORAL_TABLET | ORAL | Status: DC | PRN

## 2022-04-09 MED ORDER — BUPIVACAINE HCL (PF) 0.25 % IJ SOLN
INTRAMUSCULAR | Status: DC | PRN
  Administered 2022-04-09: 50 mL

## 2022-04-09 MED ORDER — PROPOFOL 10 MG/ML IV BOLUS
INTRAVENOUS | Status: AC
  Filled 2022-04-09: qty 20

## 2022-04-09 MED ORDER — PHENYLEPHRINE 80 MCG/ML (10ML) SYRINGE FOR IV PUSH (FOR BLOOD PRESSURE SUPPORT)
PREFILLED_SYRINGE | INTRAVENOUS | Status: DC | PRN
  Administered 2022-04-09: 160 ug via INTRAVENOUS
  Administered 2022-04-09: 80 ug via INTRAVENOUS
  Administered 2022-04-09 (×3): 160 ug via INTRAVENOUS
  Administered 2022-04-09: 80 ug via INTRAVENOUS

## 2022-04-09 MED ORDER — MIDAZOLAM HCL 2 MG/2ML IJ SOLN
INTRAMUSCULAR | Status: DC | PRN
  Administered 2022-04-09 (×2): 1 mg via INTRAVENOUS

## 2022-04-09 MED ORDER — LIDOCAINE 2% (20 MG/ML) 5 ML SYRINGE
INTRAMUSCULAR | Status: DC | PRN
  Administered 2022-04-09: 80 mg via INTRAVENOUS
  Administered 2022-04-09: 1.5 mg/kg/h via INTRAVENOUS

## 2022-04-09 MED ORDER — FENTANYL CITRATE PF 50 MCG/ML IJ SOSY
PREFILLED_SYRINGE | INTRAMUSCULAR | Status: AC
  Filled 2022-04-09: qty 3

## 2022-04-09 MED ORDER — CEFAZOLIN SODIUM-DEXTROSE 2-4 GM/100ML-% IV SOLN
2.0000 g | INTRAVENOUS | Status: AC
  Administered 2022-04-09 (×2): 2 g via INTRAVENOUS
  Filled 2022-04-09: qty 100

## 2022-04-09 MED ORDER — AMLODIPINE BESYLATE 10 MG PO TABS
10.0000 mg | ORAL_TABLET | Freq: Every day | ORAL | Status: DC
  Administered 2022-04-09 – 2022-04-11 (×3): 10 mg via ORAL
  Filled 2022-04-09 (×3): qty 1

## 2022-04-09 MED ORDER — POVIDONE-IODINE 10 % EX SWAB
2.0000 "application " | Freq: Once | CUTANEOUS | Status: AC
  Administered 2022-04-09: 2 via TOPICAL

## 2022-04-09 MED ORDER — ALBUTEROL SULFATE (2.5 MG/3ML) 0.083% IN NEBU: 3.0000 mL | INHALATION_SOLUTION | RESPIRATORY_TRACT | Status: DC | PRN

## 2022-04-09 MED ORDER — LIDOCAINE HCL (PF) 2 % IJ SOLN
INTRAMUSCULAR | Status: AC
  Filled 2022-04-09: qty 20

## 2022-04-09 MED ORDER — KCL IN DEXTROSE-NACL 20-5-0.45 MEQ/L-%-% IV SOLN
INTRAVENOUS | Status: DC
  Filled 2022-04-09 (×5): qty 1000

## 2022-04-09 MED ORDER — HEPARIN SODIUM (PORCINE) 5000 UNIT/ML IJ SOLN
5000.0000 [IU] | INTRAMUSCULAR | Status: AC
  Administered 2022-04-09: 5000 [IU] via SUBCUTANEOUS
  Filled 2022-04-09: qty 1

## 2022-04-09 MED ORDER — KETAMINE HCL 10 MG/ML IJ SOLN
INTRAMUSCULAR | Status: AC
  Filled 2022-04-09: qty 1

## 2022-04-09 MED ORDER — ONDANSETRON HCL 4 MG PO TABS: 4.0000 mg | ORAL_TABLET | Freq: Four times a day (QID) | ORAL | Status: DC | PRN

## 2022-04-09 MED ORDER — GLUCERNA SHAKE PO LIQD
237.0000 mL | Freq: Three times a day (TID) | ORAL | Status: DC
  Administered 2022-04-10 – 2022-04-15 (×4): 237 mL via ORAL
  Filled 2022-04-09 (×19): qty 237

## 2022-04-09 MED ORDER — BUPIVACAINE LIPOSOME 1.3 % IJ SUSP
INTRAMUSCULAR | Status: DC | PRN
  Administered 2022-04-09: 20 mL

## 2022-04-09 MED ORDER — FENTANYL CITRATE (PF) 250 MCG/5ML IJ SOLN
INTRAMUSCULAR | Status: AC
  Filled 2022-04-09: qty 5

## 2022-04-09 MED ORDER — PHENYLEPHRINE 80 MCG/ML (10ML) SYRINGE FOR IV PUSH (FOR BLOOD PRESSURE SUPPORT)
PREFILLED_SYRINGE | INTRAVENOUS | Status: AC
  Filled 2022-04-09: qty 10

## 2022-04-09 MED ORDER — PROPOFOL 10 MG/ML IV BOLUS
INTRAVENOUS | Status: DC | PRN
  Administered 2022-04-09: 150 mg via INTRAVENOUS

## 2022-04-09 MED ORDER — INSULIN GLARGINE-YFGN 100 UNIT/ML ~~LOC~~ SOLN
16.0000 [IU] | Freq: Once | SUBCUTANEOUS | Status: AC
  Administered 2022-04-09: 16 [IU] via SUBCUTANEOUS
  Filled 2022-04-09: qty 0.16

## 2022-04-09 MED ORDER — ACETAMINOPHEN 500 MG PO TABS
1000.0000 mg | ORAL_TABLET | Freq: Two times a day (BID) | ORAL | Status: DC
  Administered 2022-04-10 – 2022-04-13 (×7): 1000 mg via ORAL
  Filled 2022-04-09 (×7): qty 2

## 2022-04-09 MED ORDER — CEFAZOLIN SODIUM-DEXTROSE 2-4 GM/100ML-% IV SOLN
INTRAVENOUS | Status: AC
  Filled 2022-04-09: qty 100

## 2022-04-09 MED ORDER — CHEWING GUM (ORBIT) SUGAR FREE
1.0000 | CHEWING_GUM | Freq: Three times a day (TID) | ORAL | Status: DC
  Administered 2022-04-09 – 2022-04-15 (×15): 1 via ORAL
  Filled 2022-04-09: qty 1

## 2022-04-09 MED ORDER — METFORMIN HCL 500 MG PO TABS
1000.0000 mg | ORAL_TABLET | Freq: Two times a day (BID) | ORAL | Status: DC
  Administered 2022-04-10 – 2022-04-15 (×11): 1000 mg via ORAL
  Filled 2022-04-09 (×12): qty 2

## 2022-04-09 MED ORDER — MIDAZOLAM HCL 2 MG/2ML IJ SOLN
INTRAMUSCULAR | Status: AC
  Filled 2022-04-09: qty 2

## 2022-04-09 MED ORDER — DEXAMETHASONE SODIUM PHOSPHATE 10 MG/ML IJ SOLN
INTRAMUSCULAR | Status: AC
  Filled 2022-04-09: qty 1

## 2022-04-09 MED ORDER — ENOXAPARIN SODIUM 40 MG/0.4ML IJ SOSY: 40.0000 mg | PREFILLED_SYRINGE | INTRAMUSCULAR | Status: DC

## 2022-04-09 MED ORDER — ROCURONIUM BROMIDE 10 MG/ML (PF) SYRINGE
PREFILLED_SYRINGE | INTRAVENOUS | Status: DC | PRN
  Administered 2022-04-09 (×2): 20 mg via INTRAVENOUS
  Administered 2022-04-09: 60 mg via INTRAVENOUS
  Administered 2022-04-09: 20 mg via INTRAVENOUS

## 2022-04-09 MED ORDER — TRAMADOL HCL 50 MG PO TABS
100.0000 mg | ORAL_TABLET | Freq: Two times a day (BID) | ORAL | Status: DC | PRN
  Administered 2022-04-09 – 2022-04-13 (×6): 100 mg via ORAL
  Filled 2022-04-09 (×6): qty 2

## 2022-04-09 MED ORDER — ONDANSETRON HCL 4 MG/2ML IJ SOLN
INTRAMUSCULAR | Status: DC | PRN
  Administered 2022-04-09: 4 mg via INTRAVENOUS

## 2022-04-09 MED ORDER — FENTANYL CITRATE (PF) 100 MCG/2ML IJ SOLN
INTRAMUSCULAR | Status: AC
  Filled 2022-04-09: qty 2

## 2022-04-09 MED ORDER — PANTOPRAZOLE SODIUM 40 MG PO TBEC
40.0000 mg | DELAYED_RELEASE_TABLET | Freq: Every day | ORAL | Status: DC
  Administered 2022-04-10 – 2022-04-15 (×6): 40 mg via ORAL
  Filled 2022-04-09 (×6): qty 1

## 2022-04-09 SURGICAL SUPPLY — 59 items
ADH SKN CLS APL DERMABOND .7 (GAUZE/BANDAGES/DRESSINGS) ×2
APL PRP STRL LF DISP 70% ISPRP (MISCELLANEOUS) ×2
BACTOSHIELD CHG 4% 4OZ (MISCELLANEOUS) ×1
BAG COUNTER SPONGE SURGICOUNT (BAG) IMPLANT
BAG SPNG CNTER NS LX DISP (BAG)
CABLE HIGH FREQUENCY MONO STRZ (ELECTRODE) IMPLANT
CHLORAPREP W/TINT 26 (MISCELLANEOUS) ×3 IMPLANT
CLIP TI MEDIUM 6 (CLIP) ×1 IMPLANT
CNTNR URN SCR LID CUP LEK RST (MISCELLANEOUS) IMPLANT
CONT SPEC 4OZ STRL OR WHT (MISCELLANEOUS)
COVER BACK TABLE 60X90IN (DRAPES) ×1 IMPLANT
COVER SURGICAL LIGHT HANDLE (MISCELLANEOUS) ×3 IMPLANT
DERMABOND ADVANCED (GAUZE/BANDAGES/DRESSINGS) ×1
DERMABOND ADVANCED .7 DNX12 (GAUZE/BANDAGES/DRESSINGS) ×2 IMPLANT
DRAPE SHEET LG 3/4 BI-LAMINATE (DRAPES) ×1 IMPLANT
DRAPE WARM FLUID 44X44 (DRAPES) ×1 IMPLANT
DRSG OPSITE POSTOP 4X10 (GAUZE/BANDAGES/DRESSINGS) ×1 IMPLANT
ELECT REM PT RETURN 15FT ADLT (MISCELLANEOUS) ×3 IMPLANT
GAUZE 4X4 16PLY ~~LOC~~+RFID DBL (SPONGE) ×1 IMPLANT
GLOVE BIO SURGEON STRL SZ 6 (GLOVE) ×6 IMPLANT
GLOVE BIO SURGEON STRL SZ 6.5 (GLOVE) ×6 IMPLANT
GOWN STRL REUS W/ TWL LRG LVL3 (GOWN DISPOSABLE) ×4 IMPLANT
GOWN STRL REUS W/TWL LRG LVL3 (GOWN DISPOSABLE) ×6
HOLDER FOLEY CATH W/STRAP (MISCELLANEOUS) IMPLANT
IRRIG SUCT STRYKERFLOW 2 WTIP (MISCELLANEOUS)
IRRIGATION SUCT STRKRFLW 2 WTP (MISCELLANEOUS) IMPLANT
KIT BASIN OR (CUSTOM PROCEDURE TRAY) ×3 IMPLANT
KIT TURNOVER KIT A (KITS) IMPLANT
LIGASURE IMPACT 36 18CM CVD LR (INSTRUMENTS) ×1 IMPLANT
MANIPULATOR UTERINE 4.5 ZUMI (MISCELLANEOUS) IMPLANT
PAD POSITIONING PINK XL (MISCELLANEOUS) ×3 IMPLANT
SCISSORS LAP 5X35 DISP (ENDOMECHANICALS) IMPLANT
SCRUB CHG 4% DYNA-HEX 4OZ (MISCELLANEOUS) ×2 IMPLANT
SEALER TISSUE G2 CVD JAW 45CM (ENDOMECHANICALS) IMPLANT
SHEET LAVH (DRAPES) ×3 IMPLANT
SLEEVE Z-THREAD 5X100MM (TROCAR) ×3 IMPLANT
SPIKE FLUID TRANSFER (MISCELLANEOUS) IMPLANT
SPONGE INTESTINAL PEANUT (DISPOSABLE) ×1 IMPLANT
SPONGE T-LAP 18X18 ~~LOC~~+RFID (SPONGE) ×5 IMPLANT
SUT MNCRL AB 4-0 PS2 18 (SUTURE) ×6 IMPLANT
SUT PDS AB 1 TP1 96 (SUTURE) ×2 IMPLANT
SUT VIC AB 0 CT1 36 (SUTURE) ×5 IMPLANT
SUT VIC AB 2-0 CT1 27 (SUTURE) ×3
SUT VIC AB 2-0 CT1 TAPERPNT 27 (SUTURE) IMPLANT
SUT VIC AB 2-0 SH 27 (SUTURE) ×21
SUT VIC AB 2-0 SH 27X BRD (SUTURE) IMPLANT
SYS BAG RETRIEVAL 10MM (BASKET)
SYS RETRIEVAL 5MM INZII UNIV (BASKET)
SYS WOUND ALEXIS 18CM MED (MISCELLANEOUS) ×3
SYSTEM BAG RETRIEVAL 10MM (BASKET) IMPLANT
SYSTEM RETRIEVL 5MM INZII UNIV (BASKET) IMPLANT
SYSTEM WOUND ALEXIS 18CM MED (MISCELLANEOUS) IMPLANT
TOWEL OR 17X26 10 PK STRL BLUE (TOWEL DISPOSABLE) ×3 IMPLANT
TOWEL OR NON WOVEN STRL DISP B (DISPOSABLE) ×3 IMPLANT
TRAY FOLEY MTR SLVR 16FR STAT (SET/KITS/TRAYS/PACK) ×3 IMPLANT
TRAY LAPAROSCOPIC (CUSTOM PROCEDURE TRAY) ×3 IMPLANT
TROCAR ADV FIXATION 12X100MM (TROCAR) IMPLANT
TROCAR BALLN 12MMX100 BLUNT (TROCAR) IMPLANT
TROCAR Z-THREAD OPTICAL 5X100M (TROCAR) ×3 IMPLANT

## 2022-04-09 NOTE — Transfer of Care (Signed)
Immediate Anesthesia Transfer of Care Note  Patient: Sherri Gill  Procedure(s) Performed: LAPAROSCOPY DIAGNOSTIC HYSTERECTOMY ABDOMINAL BILATERAL SALPINGO OOPHORECTOMY WITH OMENTECTOMY ,DEBULKING (Bilateral)  Patient Location: PACU  Anesthesia Type:General  Level of Consciousness: drowsy  Airway & Oxygen Therapy: Patient Spontanous Breathing and Patient connected to face mask oxygen  Post-op Assessment: Report given to RN and Post -op Vital signs reviewed and stable  Post vital signs: Reviewed and stable  Last Vitals:  Vitals Value Taken Time  BP 154/92 04/09/22 1246  Temp    Pulse 86 04/09/22 1247  Resp 22 04/09/22 1247  SpO2 100 % 04/09/22 1247  Vitals shown include unvalidated device data.  Last Pain:  Vitals:   04/09/22 0645  TempSrc:   PainSc: 0-No pain      Patients Stated Pain Goal: 3 (48/47/20 7218)  Complications: No notable events documented.

## 2022-04-09 NOTE — Anesthesia Procedure Notes (Signed)
Procedure Name: Intubation Date/Time: 04/09/2022 7:34 AM  Performed by: Sharlette Dense, CRNAPre-anesthesia Checklist: Patient identified, Emergency Drugs available, Suction available and Patient being monitored Patient Re-evaluated:Patient Re-evaluated prior to induction Oxygen Delivery Method: Circle system utilized Preoxygenation: Pre-oxygenation with 100% oxygen Induction Type: IV induction Ventilation: Mask ventilation without difficulty Laryngoscope Size: Miller and 2 Grade View: Grade I Tube type: Oral Tube size: 7.5 mm Number of attempts: 1 Airway Equipment and Method: Stylet Placement Confirmation: ETT inserted through vocal cords under direct vision, positive ETCO2 and breath sounds checked- equal and bilateral Secured at: 22 cm Tube secured with: Tape Dental Injury: Teeth and Oropharynx as per pre-operative assessment

## 2022-04-09 NOTE — Op Note (Signed)
900cc 150cc urine Ileum nodule Tras colon nodule Liver bleeding Omentum adherent to liver Sigmoid adherent to uterine fundus and posterior uterus Palpated ureters  Operative Report  PATIENT: Sherri Gill  Preop Diagnosis:   Postoperative Diagnosis:   Surgery: Diagnostic laparoscopy, conversion to exploratory laparotomy, partial omentectomy, lysis of adhesions for approximately 60 minutes, total abdominal hysterectomy, bilateral salpingo-oophorectomy, excision of ileal tumor implant and excision of transverse colon epiploica tumor implant  Surgeons:  Valarie Cones, MD; Lahoma Crocker, MD   Anesthesia: General   Anesthesiologist:   Estimated blood loss: 900 ml  IVF: see I&O flowsheet, received 1u pRBCs  Urine output:  588 ml   Complications: None apparent   Pathology: Uterus, cervix, bilateral tubes and ovaries, omentum, ileum implant, transverse colon epiploica implant  Operative findings: ***  Procedure: The patient was identified in the preoperative holding area. Informed consent was signed on the chart. Patient was seen history was reviewed and exam was performed.   The patient was then taken to the operating room and placed in the supine position with SCD hose on. General anesthesia was then induced without difficulty. She was then placed in the dorsolithotomy position. The abdomen was prepped with chlor prep sponges per protocol. Perineum was prepped with Betadine. The vagina was prepped with Betadine a Foley catheter was inserted into the bladder under sterile conditions.  The patient was then draped after the prep was dried. Timeout was performed the patient, procedure, antibiotic, allergy, and length of procedure. A vertical midline infraumbilical incision was and carried down to the underlying fascia using Bovie cautery. The fascia was scored in the fascial incision was extended superiorly and inferiorly using Bovie cautery. The rectus bellies were dissected  off the overlying fascia. The peritoneum was tented and entered. The peritoneal incision was extended superiorly and inferiorly with visualization of the underlying peritoneal cavity. The Buchwalter self-retaining retractor was then placed. At the initial placement as well as at several points during the case the lateral blades were checked to ensure no significant pressure on the psoas bellies.  The small and large bowel were packed out of the way of the surgical field with moist laparotomy sponges and malleable retractors were attached to the Fonda. Abdominal pelvic washings were obtained. The round ligament on the patient's right side was transected with monopolar cautery the anterior posterior leaves the broad ligament were opened. A window was made between the infundibulopelvic vessels and the ureter. The vessels were clamped x2 transected and suture ligated. The bladder flap was created at this point and the uterine vessels on the right side were skeletonized. The uterine vessels were clamped with curved Masterson clamps transected and suture ligated. Our attention was turned to the left side were similar procedure was performed in that the round ligament was transected and the anterior and posterior leaves the broad ligament were opened. A window was made between the vessels and the ureter on the left and the vessels were clamped x2 transected and suture ligated. The uterine vessels were skeletonized clamped x2 transected and suture ligated. We continued down the cardinal ligaments with straight Masterson clamps the cervicovaginal junction was reached. We came across the vagina just under the cervix with curved Masterson clamps. The cervix was amputated from the vagina. The angle pedicles as well as vaginal cuff were closed using figure-of-eight sutures of 0 Vicryl.  The abdomen pelvis were copiously irrigated. The retractor and laparotomy sponges were removed. The fascia was closed using running  mass closure of #1  PDS. The subcutaneous tissues were irrigated and made hemostatic. 20 mL of Exparel within 20 mL of normal saline was injected for postoperative pain control. The skin was closed using staples.  ***count issue  All instrument, suture, laparotomy, Ray-Tec, and needle counts were correct x2. The patient tolerated the procedure well and was taken recovery room in stable condition.   Jeral Pinch MD Gynecologic Oncology

## 2022-04-09 NOTE — Brief Op Note (Signed)
04/09/2022  11:52 AM  PATIENT:  Sherri Gill  71 y.o. female  PRE-OPERATIVE DIAGNOSIS:  ADVANCED GYN MALIGNANCY  POST-OPERATIVE DIAGNOSIS:  ADVANCED GYN MALIGNANCY  PROCEDURE:  Procedure(s): LAPAROSCOPY DIAGNOSTIC (N/A) HYSTERECTOMY ABDOMINAL BILATERAL SALPINGO OOPHORECTOMY WITH OMENTECTOMY ,DEBULKING (Bilateral)  SURGEON:  Surgeon(s) and Role:    Lafonda Mosses, MD - Primary   ASSISTANTS: Lahoma Crocker MD   ANESTHESIA:   general, exparel  EBL:  900 mL   BLOOD ADMINISTERED: 1u pRBCs  DRAINS: none   LOCAL MEDICATIONS USED:  MARCAINE     SPECIMEN:  omentum, uterus, cervix, tubes and ovaries, ileal nodule, transverse colon mesentery nodule  DISPOSITION OF SPECIMEN:  PATHOLOGY  COUNTS:  no extra instrument - xray taken  TOURNIQUET:  * No tourniquets in log *  DICTATION: .Note written in EPIC  PLAN OF CARE: Admit to inpatient   PATIENT DISPOSITION:  PACU - hemodynamically stable.   Delay start of Pharmacological VTE agent (>24hrs) due to surgical blood loss or risk of bleeding: no

## 2022-04-09 NOTE — Anesthesia Postprocedure Evaluation (Signed)
Anesthesia Post Note  Patient: Sherri Gill  Procedure(s) Performed: LAPAROSCOPY DIAGNOSTIC HYSTERECTOMY ABDOMINAL BILATERAL SALPINGO OOPHORECTOMY WITH OMENTECTOMY ,DEBULKING (Bilateral)     Patient location during evaluation: PACU Anesthesia Type: General Level of consciousness: awake and alert Pain management: pain level controlled Vital Signs Assessment: post-procedure vital signs reviewed and stable Respiratory status: spontaneous breathing, nonlabored ventilation, respiratory function stable and patient connected to nasal cannula oxygen Cardiovascular status: blood pressure returned to baseline and stable Postop Assessment: no apparent nausea or vomiting Anesthetic complications: no   No notable events documented.  Last Vitals:  Vitals:   04/09/22 1450 04/09/22 1608  BP: (!) 160/79 (!) 171/70  Pulse: 88 91  Resp: 20 14  Temp:    SpO2: 100% 100%    Last Pain:  Vitals:   04/09/22 1532  TempSrc:   PainSc: [place]

## 2022-04-09 NOTE — Interval H&P Note (Signed)
History and Physical Interval Note:  04/09/2022 6:43 AM  Sherri Gill  has presented today for surgery, with the diagnosis of ADVANCED GYN MALIGNANCY.  The various methods of treatment have been discussed with the patient and family. After consideration of risks, benefits and other options for treatment, the patient has consented to  Procedure(s): LAPAROSCOPY DIAGNOSTIC, POSSIBLE OPEN (N/A) POSSIBLE XI ROBOTIC ASSISTED TOTAL HYSTERECTOMY BILATERAL SALPINGO OOPHORECTOMY WITH OMENTECTOMY ,DEBULKING, POSSIBLE  LAPAROTOMY (Bilateral) POSSIBLE HYSTERECTOMY ABDOMINAL BILATERAL SALPINGO OOPHORECTOMY WITH OMENTECTOMY ,DEBULKING (Bilateral) POSSIBLE HERNIA REPAIR VENTRAL ADULT (N/A) as a surgical intervention.  The patient's history has been reviewed, patient examined, no change in status, stable for surgery.  I have reviewed the patient's chart and labs.  Questions were answered to the patient's satisfaction.     Lafonda Mosses

## 2022-04-10 ENCOUNTER — Encounter (HOSPITAL_COMMUNITY): Payer: Self-pay | Admitting: Gynecologic Oncology

## 2022-04-10 LAB — GLUCOSE, CAPILLARY
Glucose-Capillary: 156 mg/dL — ABNORMAL HIGH (ref 70–99)
Glucose-Capillary: 166 mg/dL — ABNORMAL HIGH (ref 70–99)
Glucose-Capillary: 143 mg/dL — ABNORMAL HIGH (ref 70–99)
Glucose-Capillary: 155 mg/dL — ABNORMAL HIGH (ref 70–99)
Glucose-Capillary: 161 mg/dL — ABNORMAL HIGH (ref 70–99)

## 2022-04-10 LAB — CBC
HCT: 27.8 % — ABNORMAL LOW (ref 36.0–46.0)
Hemoglobin: 8.9 g/dL — ABNORMAL LOW (ref 12.0–15.0)
MCH: 27.1 pg (ref 26.0–34.0)
MCHC: 32 g/dL (ref 30.0–36.0)
MCV: 84.5 fL (ref 80.0–100.0)
Platelets: 330 10*3/uL (ref 150–400)
RBC: 3.29 MIL/uL — ABNORMAL LOW (ref 3.87–5.11)
RDW: 23.9 % — ABNORMAL HIGH (ref 11.5–15.5)
WBC: 9.4 10*3/uL (ref 4.0–10.5)
nRBC: 0 % (ref 0.0–0.2)

## 2022-04-10 LAB — BASIC METABOLIC PANEL
Anion gap: 6 (ref 5–15)
BUN: 14 mg/dL (ref 8–23)
CO2: 26 mmol/L (ref 22–32)
Calcium: 8.8 mg/dL — ABNORMAL LOW (ref 8.9–10.3)
Chloride: 102 mmol/L (ref 98–111)
Creatinine, Ser: 0.71 mg/dL (ref 0.44–1.00)
GFR, Estimated: >60 mL/min (ref >60)
Glucose, Bld: 148 mg/dL — ABNORMAL HIGH (ref 70–99)
Potassium: 4.1 mmol/L (ref 3.5–5.1)
Sodium: 134 mmol/L — ABNORMAL LOW (ref 135–145)

## 2022-04-10 LAB — URINE CULTURE: Culture: NO GROWTH

## 2022-04-10 MED ORDER — ENOXAPARIN SODIUM 40 MG/0.4ML IJ SOSY
40.0000 mg | PREFILLED_SYRINGE | INTRAMUSCULAR | Status: DC
  Administered 2022-04-10 – 2022-04-15 (×5): 40 mg via SUBCUTANEOUS
  Filled 2022-04-10 (×6): qty 0.4

## 2022-04-10 MED ORDER — HYDROMORPHONE HCL 2 MG PO TABS
2.0000 mg | ORAL_TABLET | ORAL | Status: DC | PRN
  Administered 2022-04-11 – 2022-04-12 (×3): 2 mg via ORAL
  Filled 2022-04-10 (×3): qty 1

## 2022-04-10 NOTE — Plan of Care (Signed)
  Problem: Tissue Perfusion: Goal: Adequacy of tissue perfusion will improve Outcome: Progressing   

## 2022-04-10 NOTE — Progress Notes (Signed)
  Transition of Care [facility]) Screening Note   Patient Details  Name: Sherri Gill Date of Birth: 12-02-1950   Transition of Care [facility]) CM/SW Contact:    Lennart Pall, LCSW Phone Number: 04/10/2022, 11:23 AM    Transition of Care Department [facility]) has reviewed patient and no TOC needs have been identified at this time. We will continue to monitor patient advancement through interdisciplinary progression rounds. If new patient transition needs arise, please place a TOC consult.

## 2022-04-10 NOTE — Progress Notes (Signed)
1 Day Post-Op Procedure(s) (LRB): LAPAROSCOPY DIAGNOSTIC (N/A) HYSTERECTOMY ABDOMINAL BILATERAL SALPINGO OOPHORECTOMY WITH OMENTECTOMY ,DEBULKING (Bilateral)  Subjective: Patient reports moderate abdominal pain. States the tramadol has been assisting with pain relief. Had an episode of emesis last pm. No nausea reported this am. No flatus reported. Reports intermittent coughing. No dyspnea. No concerns voiced.  Objective: Vital signs in last 24 hours: Temp:  [97.5 F (36.4 C)-99 F (37.2 C)] 99 F (37.2 C) (06/14 0603) Pulse Rate:  [79-102] 96 (06/14 0603) Resp:  [14-21] 15 (06/13 2207) BP: (146-171)/(69-92) 147/69 (06/14 0603) SpO2:  [96 %-100 %] 100 % (06/14 0603) Last BM Date : 04/08/22  Intake/Output from previous day: 06/13 0701 - 06/14 0700 In: 4251.9 [P.O.:240; I.V.:3528.9; Blood:283; IV Piggyback:200] Out: 1880 [Urine:980; Blood:900]  Physical Examination: General: alert, cooperative, and no distress Resp: clear to auscultation bilaterally Cardio: regular rate and rhythm, S1, S2 normal, no murmur, click, rub or gallop and mildly tachycardic GI: incision: midline incision with op site dressing in place with no drainage noted underneath and abdomen soft, appropriately tender, hypoactive bowel sounds Extremities: extremities normal, atraumatic, no cyanosis or edema  Labs: WBC/Hgb/Hct/Plts:  9.0/9.7/30.0/327 (06/13 1930) BUN/Cr/glu/ALT/AST/amyl/lip:  17/0.79/--/--/--/--/-- (06/13 1930)  Assessment: 71 y.o. s/p Procedure(s): LAPAROSCOPY DIAGNOSTIC HYSTERECTOMY ABDOMINAL BILATERAL SALPINGO OOPHORECTOMY WITH OMENTECTOMY ,DEBULKING: stable Pain:  Pain is well-controlled on PRN medications.  Heme: On CBC from last pm: Hgb 9.7 and Hct 30.0. Awaiting results of am labs.  ID: No evidence of infection. Received IV antibiotics intraoperatively.  CV: BP and HR stable. Continue to monitor with ordered vital sign checks.  GI:  Tolerating po: Yes   Antiemetics ordered prn.  GU:  Adequate amounts reported. Creatinine last pm 0.79.    FEN: No critical values on pm labs. Awaiting results of am labs.  Endo: Diabetes mellitus Type II, under good control.  CBG: CBG (last 3)  Recent Labs    04/09/22 1615 04/09/22 2211 04/10/22 0145  GLUCAP 176* 90 156*     Prophylaxis: SCDs and lovenox ordered.  Plan: Awaiting results of am labs- IV team to draw from port Encourage IS use, increasing mobility Continue plan of care per [name] When meeting milestones, patient will be discharged to home.   LOS: 1 day    Dorothyann Gibbs 04/10/2022, 7:20 AM

## 2022-04-11 LAB — CBC
HCT: 24.8 % — ABNORMAL LOW (ref 36.0–46.0)
HCT: 27.2 % — ABNORMAL LOW (ref 36.0–46.0)
Hemoglobin: 7.7 g/dL — ABNORMAL LOW (ref 12.0–15.0)
Hemoglobin: 8.5 g/dL — ABNORMAL LOW (ref 12.0–15.0)
MCH: 27 pg (ref 26.0–34.0)
MCH: 27.2 pg (ref 26.0–34.0)
MCHC: 31 g/dL (ref 30.0–36.0)
MCHC: 31.3 g/dL (ref 30.0–36.0)
MCV: 87 fL (ref 80.0–100.0)
MCV: 87.2 fL (ref 80.0–100.0)
Platelets: 296 10*3/uL (ref 150–400)
Platelets: 331 10*3/uL (ref 150–400)
RBC: 2.85 MIL/uL — ABNORMAL LOW (ref 3.87–5.11)
RBC: 3.12 MIL/uL — ABNORMAL LOW (ref 3.87–5.11)
RDW: 24.2 % — ABNORMAL HIGH (ref 11.5–15.5)
RDW: 24.3 % — ABNORMAL HIGH (ref 11.5–15.5)
WBC: 11.3 10*3/uL — ABNORMAL HIGH (ref 4.0–10.5)
WBC: 12.7 10*3/uL — ABNORMAL HIGH (ref 4.0–10.5)
nRBC: 0 % (ref 0.0–0.2)
nRBC: 0 % (ref 0.0–0.2)

## 2022-04-11 LAB — GLUCOSE, CAPILLARY
Glucose-Capillary: 135 mg/dL — ABNORMAL HIGH (ref 70–99)
Glucose-Capillary: 104 mg/dL — ABNORMAL HIGH (ref 70–99)
Glucose-Capillary: 120 mg/dL — ABNORMAL HIGH (ref 70–99)
Glucose-Capillary: 109 mg/dL — ABNORMAL HIGH (ref 70–99)

## 2022-04-11 LAB — BASIC METABOLIC PANEL
Anion gap: 6 (ref 5–15)
BUN: 13 mg/dL (ref 8–23)
CO2: 25 mmol/L (ref 22–32)
Calcium: 8.4 mg/dL — ABNORMAL LOW (ref 8.9–10.3)
Chloride: 104 mmol/L (ref 98–111)
Creatinine, Ser: 0.8 mg/dL (ref 0.44–1.00)
GFR, Estimated: >60 mL/min (ref >60)
Glucose, Bld: 153 mg/dL — ABNORMAL HIGH (ref 70–99)
Potassium: 4.3 mmol/L (ref 3.5–5.1)
Sodium: 135 mmol/L (ref 135–145)

## 2022-04-11 MED ORDER — LATANOPROST 0.005 % OP SOLN
1.0000 [drp] | Freq: Every day | OPHTHALMIC | Status: DC
  Administered 2022-04-11 – 2022-04-14 (×4): 1 [drp] via OPHTHALMIC
  Filled 2022-04-11: qty 2.5

## 2022-04-11 MED ORDER — POLYETHYLENE GLYCOL 3350 17 G PO PACK
17.0000 g | PACK | Freq: Every day | ORAL | Status: DC
  Administered 2022-04-11 – 2022-04-15 (×5): 17 g via ORAL
  Filled 2022-04-11 (×5): qty 1

## 2022-04-11 MED ORDER — DORZOLAMIDE HCL-TIMOLOL MAL 2-0.5 % OP SOLN
1.0000 [drp] | Freq: Two times a day (BID) | OPHTHALMIC | Status: DC
  Administered 2022-04-11 – 2022-04-15 (×8): 1 [drp] via OPHTHALMIC
  Filled 2022-04-11: qty 10

## 2022-04-11 NOTE — Progress Notes (Signed)
Repeat CBC results noted. [name] aware. Plan for repeat labs in the am to follow up. Lovenox can be resumed.

## 2022-04-11 NOTE — Progress Notes (Signed)
24 hour chart audit completed 

## 2022-04-11 NOTE — Progress Notes (Signed)
GYN Oncology Progress Note  Patient resting in bed in no acute distress. States she has been out of the bed sitting up and now has incisional soreness. Starting passing flatus. No BM reported. Reports a productive cough with yellow phlegm at times. She has not ambulated in the halls. Voiding without difficulty. Continues to report intermittent hot flashes earlier today. No chills. Using IS. Denies chest pain, dyspnea.   Alert, oriented, in no acute distress. Lungs clear. Heart rate at 95 bpm. Abdomen slightly distended, non tympanic, soft, hypoactive bowel sounds, appropriately tender with palpation. No lower extrem pitting edema.  Encouraged ambulation in the hall and patient is agreeable to this. Advised to continue with IS use. Advised to notify staff during a "hot flash" episode to have temperature checked. Plan to check a urine sample per Dr. Merrilee Jansky. No needs or concerns voiced. All questions answered.

## 2022-04-11 NOTE — Plan of Care (Signed)
  Problem: Coping: Goal: Ability to adjust to condition or change in health will improve Outcome: Progressing   

## 2022-04-11 NOTE — Progress Notes (Addendum)
2 Days Post-Op Procedure(s) (LRB): LAPAROSCOPY DIAGNOSTIC (N/A) HYSTERECTOMY ABDOMINAL BILATERAL SALPINGO OOPHORECTOMY WITH OMENTECTOMY ,DEBULKING (Bilateral)  Subjective: Patient reports doing well this am. Tolerating diet with no nausea or emesis. Voiding large amounts and staying hydrated per pt. Abdominal pain is relieved with oral pain medication. No flatus or BM reported. Feels she may have had mild wheezing this am. Reporting intermittent hot flashes with normal temperature readings. Reports weakness when ambulating but improving. No dyspnea or chest pain. No concerns voiced.  Objective: Vital signs in last 24 hours: Temp:  [98.2 F (36.8 C)-98.8 F (37.1 C)] 98.2 F (36.8 C) (06/15 0622) Pulse Rate:  [87-96] 88 (06/15 0622) BP: (138-154)/(63-73) 138/63 (06/15 0622) SpO2:  [91 %-100 %] 96 % (06/15 0622) Last BM Date : 04/08/22  Intake/Output from previous day: 06/14 0701 - 06/15 0700 In: 3011.5 [P.O.:720; I.V.:2291.5] Out: 2350 [Urine:2350]  Physical Examination: General: alert, cooperative, and no distress Resp: clear to auscultation bilaterally Cardio: regular rate and rhythm, S1, S2 normal, no murmur, click, rub or gallop GI: incision: midline incision with op site dressing present with no drainage noted underneath and abdomen soft, non-tympanic, mildly distended, hypoactive bowel sounds Extremities: Trace bilateral pedal edema, non-pitting. No cyanosis. Right chest port-a-cath accessed-dressing intact  Labs: WBC/Hgb/Hct/Plts:  11.3/7.7/24.8/296 (06/15 5400) BUN/Cr/glu/ALT/AST/amyl/lip:  13/0.80/--/--/--/--/-- (06/15 0228)  Assessment: 71 y.o. s/p Procedure(s): LAPAROSCOPY DIAGNOSTIC HYSTERECTOMY ABDOMINAL BILATERAL SALPINGO OOPHORECTOMY WITH OMENTECTOMY ,DEBULKING: stable Pain:  Pain is well-controlled on PRN medications.  Heme: Hgb 7.7 from 9.7 and Hct 24.8 from 30.0. Plan for repeat CBC this am. Has hx of iron deficiency anemia.   ID: No evidence of infection.  Received IV antibiotics intraoperatively. WBC 11.3 this am-plan for recheck this am.  CV: BP and HR stable. Continue to monitor with ordered vital sign checks.  GI:  Tolerating po: Yes   Antiemetics ordered prn.  GU: Adequate amounts reported. Creatinine 0.80.    FEN: No critical values on pm labs. Awaiting results of am labs.  Endo: Diabetes mellitus Type II, under good control.  CBG: CBG (last 3)  Recent Labs    04/10/22 1629 04/10/22 2306 04/11/22 0736  GLUCAP 155* 161* 135*     Prophylaxis: SCDs and lovenox ordered.  Plan: Plan for repeat CBC this am- IV team to draw from port Encourage IS use, increasing mobility Plan for capping port, discontinue IVF PT/OT consult Continue plan of care per [name] and Dr. Delsa Sale When meeting milestones, patient will be discharged to home.   LOS: 2 days    Sherri Gill 04/11/2022, 9:03 AM

## 2022-04-12 ENCOUNTER — Telehealth: Payer: Self-pay

## 2022-04-12 ENCOUNTER — Inpatient Hospital Stay (HOSPITAL_COMMUNITY): Payer: Medicare HMO

## 2022-04-12 ENCOUNTER — Other Ambulatory Visit (HOSPITAL_COMMUNITY): Payer: Self-pay

## 2022-04-12 ENCOUNTER — Other Ambulatory Visit: Payer: Self-pay

## 2022-04-12 DIAGNOSIS — C579 Malignant neoplasm of female genital organ, unspecified: Secondary | ICD-10-CM

## 2022-04-12 DIAGNOSIS — I480 Paroxysmal atrial fibrillation: Secondary | ICD-10-CM | POA: Diagnosis present

## 2022-04-12 DIAGNOSIS — I4891 Unspecified atrial fibrillation: Secondary | ICD-10-CM | POA: Diagnosis present

## 2022-04-12 DIAGNOSIS — D62 Acute posthemorrhagic anemia: Secondary | ICD-10-CM | POA: Diagnosis present

## 2022-04-12 LAB — GLUCOSE, CAPILLARY
Glucose-Capillary: 106 mg/dL — ABNORMAL HIGH (ref 70–99)
Glucose-Capillary: 125 mg/dL — ABNORMAL HIGH (ref 70–99)
Glucose-Capillary: 135 mg/dL — ABNORMAL HIGH (ref 70–99)
Glucose-Capillary: 129 mg/dL — ABNORMAL HIGH (ref 70–99)
Glucose-Capillary: 103 mg/dL — ABNORMAL HIGH (ref 70–99)

## 2022-04-12 LAB — BASIC METABOLIC PANEL
Anion gap: 6 (ref 5–15)
BUN: 12 mg/dL (ref 8–23)
CO2: 25 mmol/L (ref 22–32)
Calcium: 8.6 mg/dL — ABNORMAL LOW (ref 8.9–10.3)
Chloride: 104 mmol/L (ref 98–111)
Creatinine, Ser: 0.9 mg/dL (ref 0.44–1.00)
GFR, Estimated: >60 mL/min (ref >60)
Glucose, Bld: 122 mg/dL — ABNORMAL HIGH (ref 70–99)
Potassium: 4.1 mmol/L (ref 3.5–5.1)
Sodium: 135 mmol/L (ref 135–145)

## 2022-04-12 LAB — MRSA NEXT GEN BY PCR, NASAL: MRSA by PCR Next Gen: NOT DETECTED

## 2022-04-12 LAB — ECHOCARDIOGRAM COMPLETE
Area-P 1/2: 4.15 cm2
Height: 63 in
S' Lateral: 2.2 cm
Weight: 2786.61 oz

## 2022-04-12 LAB — CBC
HCT: 24.2 % — ABNORMAL LOW (ref 36.0–46.0)
Hemoglobin: 7.4 g/dL — ABNORMAL LOW (ref 12.0–15.0)
MCH: 26.9 pg (ref 26.0–34.0)
MCHC: 30.6 g/dL (ref 30.0–36.0)
MCV: 88 fL (ref 80.0–100.0)
Platelets: 271 10*3/uL (ref 150–400)
RBC: 2.75 MIL/uL — ABNORMAL LOW (ref 3.87–5.11)
RDW: 24.3 % — ABNORMAL HIGH (ref 11.5–15.5)
WBC: 12.5 10*3/uL — ABNORMAL HIGH (ref 4.0–10.5)
nRBC: 0 % (ref 0.0–0.2)

## 2022-04-12 LAB — PREPARE RBC (CROSSMATCH)

## 2022-04-12 LAB — TROPONIN I (HIGH SENSITIVITY)
Troponin I (High Sensitivity): 4 ng/L (ref <18)
Troponin I (High Sensitivity): 5 ng/L (ref <18)

## 2022-04-12 LAB — MAGNESIUM: Magnesium: 1.7 mg/dL (ref 1.7–2.4)

## 2022-04-12 LAB — HEMOGLOBIN AND HEMATOCRIT, BLOOD
HCT: 29.2 % — ABNORMAL LOW (ref 36.0–46.0)
Hemoglobin: 9.2 g/dL — ABNORMAL LOW (ref 12.0–15.0)

## 2022-04-12 LAB — TSH: TSH: 3.817 u[IU]/mL (ref 0.350–4.500)

## 2022-04-12 LAB — SURGICAL PATHOLOGY

## 2022-04-12 LAB — BRAIN NATRIURETIC PEPTIDE: B Natriuretic Peptide: 98.9 pg/mL (ref 0.0–100.0)

## 2022-04-12 MED ORDER — SODIUM CHLORIDE 0.9% IV SOLUTION: Freq: Once | INTRAVENOUS | Status: AC

## 2022-04-12 MED ORDER — DILTIAZEM HCL 25 MG/5ML IV SOLN
10.0000 mg | Freq: Once | INTRAVENOUS | Status: AC
  Administered 2022-04-12: 10 mg via INTRAVENOUS
  Filled 2022-04-12: qty 5

## 2022-04-12 MED ORDER — DILTIAZEM HCL 30 MG PO TABS
30.0000 mg | ORAL_TABLET | Freq: Four times a day (QID) | ORAL | Status: DC
Start: 2022-04-12 — End: 2022-04-13
  Administered 2022-04-12 – 2022-04-13 (×4): 30 mg via ORAL
  Filled 2022-04-12 (×6): qty 1

## 2022-04-12 MED ORDER — LEVALBUTEROL HCL 1.25 MG/0.5ML IN NEBU
1.2500 mg | INHALATION_SOLUTION | Freq: Four times a day (QID) | RESPIRATORY_TRACT | Status: DC | PRN
Start: 2022-04-12 — End: 2022-04-15

## 2022-04-12 MED ORDER — METOPROLOL TARTRATE 5 MG/5ML IV SOLN: 5.0000 mg | Freq: Four times a day (QID) | INTRAVENOUS | Status: DC | PRN

## 2022-04-12 MED ORDER — RIVAROXABAN 10 MG PO TABS
10.0000 mg | ORAL_TABLET | Freq: Every day | ORAL | 0 refills | Status: DC
  Filled 2022-04-12: qty 24, 24d supply, fill #0

## 2022-04-12 MED ORDER — ORAL CARE MOUTH RINSE: 15.0000 mL | OROMUCOSAL | Status: DC | PRN

## 2022-04-12 NOTE — Discharge Instructions (Signed)
AFTER SURGERY INSTRUCTIONS   Return to work: 4-6 weeks if applicable   YOU WILL NEED TO BE ON A BLOOD THINNER (xarelto) AFTER SURGERY TO HELP WITH PREVENTING BLOOD CLOTS. YOU WILL START THIS ON THE DAY AFTER LEAVING THE HOSPITAL AND IT SHOULD BE TAKEN AROUND THE SAME TIME EACH DAY. YOU WOULD NEED A TOTAL OF 4 WEEKS.   Since you will be on a blood thinner (xarelto), you will need to monitor for abnormal bleeding and call the office.  You will have a white honeycomb dressing over your larger incision. This dressing can be removed 5 days after surgery and you do not need to reapply a new dressing. Once you remove the dressing, you will notice that you have the surgical glue (dermabond) on the incision and this will peel off on its own. You can get this dressing wet in the shower the days after surgery prior to removal on the 5th day.    Activity: 1. Be up and out of the bed during the day.  Take a nap if needed.  You may walk up steps but be careful and use the hand rail.  Stair climbing will tire you more than you think, you may need to stop part way and rest.    2. No lifting or straining for 6 weeks over 10 pounds. No pushing, pulling, straining for 6 weeks.   3. No driving for around 1 week(s).  Do not drive if you are taking narcotic pain medicine and make sure that your reaction time has returned.    4. You can shower as soon as the next day after surgery. Shower daily.  Use your regular soap and water (not directly on the incision) and pat your incision(s) dry afterwards; don't rub.  No tub baths or submerging your body in water until cleared by your surgeon. If you have the soap that was given to you by pre-surgical testing that was used before surgery, you do not need to use it afterwards because this can irritate your incisions.    5. No sexual activity and nothing in the vagina for 8 weeks.   6. You may experience a small amount of clear drainage from your incisions, which is normal.  If  the drainage persists, increases, or changes color please call the office.   7. Do not use creams, lotions, or ointments such as neosporin on your incisions after surgery until advised by your surgeon because they can cause removal of the dermabond glue on your incisions.     8. You may experience vaginal spotting after surgery or around the 6-8 week mark from surgery when the stitches at the top of the vagina begin to dissolve.  The spotting is normal but if you experience heavy bleeding, call our office.   9. Take Tylenol or ibuprofen (use sparingly since you are on a blood thinner) first for pain and only use narcotic pain medication for severe pain not relieved by the Tylenol or Ibuprofen.  Monitor your Tylenol intake to a max of 4,000 mg in a 24 hour period. You can alternate these medications after surgery.   Diet: 1. Low sodium Heart Healthy Diet is recommended but you are cleared to resume your normal (before surgery) diet after your procedure.   2. It is safe to use a laxative, such as Miralax or Colace, if you have difficulty moving your bowels. You have been prescribed Sennakot-S to take at bedtime every evening after surgery to keep bowel movements  regular and to prevent constipation.     Wound Care: 1. Keep clean and dry.  Shower daily.   Reasons to call the Doctor: Fever - Oral temperature greater than 100.4 degrees Fahrenheit Foul-smelling vaginal discharge Difficulty urinating Nausea and vomiting Increased pain at the site of the incision that is unrelieved with pain medicine. Difficulty breathing with or without chest pain New calf pain especially if only on one side Sudden, continuing increased vaginal bleeding with or without clots.   Contacts: For questions or concerns you should contact:   Dr. Jeral Pinch at [phone]   [name], NP at [phone]   After Hours: call [phone] and have the GYN Oncologist paged/contacted (after 5 pm or on the  weekends).   Messages sent via mychart are for non-urgent matters and are not responded to after hours so for urgent needs, please call the after hours number.

## 2022-04-12 NOTE — Progress Notes (Signed)
3 Days Post-Op Procedure(s) (LRB): LAPAROSCOPY DIAGNOSTIC (N/A) HYSTERECTOMY ABDOMINAL BILATERAL SALPINGO OOPHORECTOMY WITH OMENTECTOMY ,DEBULKING (Bilateral)  Interval update: Contacted by patient's nurse that her heart rate was in the 120s after getting up to go to the bathroom.  EKG ordered and showed atrial fibrillation with RVR.  Subjective: Patient reports doing well.  She started feeling her heartbeat last night, feeling it again now.  Upon further questioning, she endorses at least 1 or 2 episodes when this is happened previously, usually around anxiety.  Has never been diagnosed with A-fib.  Denies any chest pain or shortness of breath.  Reports some increased lower abdominal pain.  Had MiraLAX recently.  Continues to endorse flatus, her RN confirms that she had a bowel movement yesterday.  Walked recently with physical therapy.  She has been placed on 2 L of oxygen by nasal cannula since I saw her earlier this morning.  Objective: Vital signs in last 24 hours: Temp:  [98.3 F (36.8 C)-99.6 F (37.6 C)] 99.6 F (37.6 C) (06/16 1212) Pulse Rate:  [92-128] 128 (06/16 1212) Resp:  [14-18] 16 (06/16 1212) BP: (131-147)/(58-71) 132/71 (06/16 1212) SpO2:  [93 %-100 %] 93 % (06/16 1212) Weight:  [174 lb 2.6 oz (79 kg)] 174 lb 2.6 oz (79 kg) (06/16 0310) Last BM Date : 04/11/22  Intake/Output from previous day: 06/15 0701 - 06/16 0700 In: 1320 [P.O.:1320] Out: 1650 [Urine:1650]  Physical Examination: Gen: No acute distress, alert and oriented Cardiovascular: Irregularly irregular. Pulmonary: Clear to auscultation bilaterally.  Unlabored breathing on 2 L of oxygen by nasal cannula.  Labs: WBC/Hgb/Hct/Plts:  12.5/7.4/24.2/271 (06/16 0177) BUN/Cr/glu/ALT/AST/amyl/lip:  12/0.90/--/--/--/--/-- (06/16 0419)   Assessment:  71 y.o. s/p Procedure(s): LAPAROSCOPY DIAGNOSTIC HYSTERECTOMY ABDOMINAL BILATERAL SALPINGO OOPHORECTOMY WITH OMENTECTOMY ,DEBULKING: Continues to meet  postoperative milestones.  Patient now has new onset A-fib.  In talking with her, it sounds like she has had symptoms of A-fib on several episodes previously.  EKG confirmed this diagnosis.  Call placed to medicine for further evaluation.  We will call the lab to see if we can add on a magnesium level from this morning.  Otherwise we will have this drawn.    The patient is currently on prophylactic anticoagulation.  Given extent of her surgery, she would not be a candidate for therapeutic anticoagulation for at least another several days.  In the setting of her acute on chronic anemia in the setting of blood loss from surgery, 1 unit of packed red blood cells ordered this morning, currently transfusing.  Lafonda Mosses 04/12/2022, 1:22 PM

## 2022-04-12 NOTE — Progress Notes (Signed)
  Echocardiogram 2D Echocardiogram has been performed.  Darlina Sicilian M 04/12/2022, 2:10 PM

## 2022-04-12 NOTE — Evaluation (Signed)
Occupational Therapy Evaluation Patient Details Name: Sherri Gill MRN: [id number] DOB: Oct 07, 1951 Today's Date: 04/12/2022   History of Present Illness Pt is 71 yo female with advanced serous uterine cancer now s/p LAPAROSCOPY DIAGNOSTIC and HYSTERECTOMY ABDOMINAL BILATERAL SALPINGO OOPHORECTOMY WITH OMENTECTOMY ,DEBULKING (Bilateral) on 6/13. PMH includes AKI, chronic anemia,  ascites, DM, Dyspnea, HTN.   Clinical Impression   Pt admitted with the above diagnoses and presents with below problem list. Pt will benefit from continued acute OT to address the below listed deficits and maximize independence with basic ADLs prior to d/c home. At baseline, pt is independent with ADLs. Pt lives with son, daughter coming to stay for a few days as well once she's home. Pt currently needs up to min guard assist with functional transfers and mobility, up to min A with LB ADLs.        Recommendations for follow up therapy are one component of a multi-disciplinary discharge planning process, led by the attending physician.  Recommendations may be updated based on patient status, additional functional criteria and insurance authorization.   Follow Up Recommendations  Home health OT    Assistance Recommended at Discharge Intermittent Supervision/Assistance  Patient can return home with the following A little help with bathing/dressing/bathroom;Help with stairs or ramp for entrance    Functional Status Assessment  Patient has had a recent decline in their functional status and demonstrates the ability to make significant improvements in function in a reasonable and predictable amount of time.  Equipment Recommendations  Tub/shower bench    Recommendations for Other Services PT consult     Precautions / Restrictions Precautions Precautions: Fall Required Braces or Orthoses: Other Brace Other Brace: abdominal binder in place Restrictions Weight Bearing Restrictions: No      Mobility Bed  Mobility Overal bed mobility: Needs Assistance Bed Mobility: Rolling, Sidelying to Sit, Sit to Supine Rolling: Min guard Sidelying to sit: Min guard, HOB elevated   Sit to supine: Min guard, HOB elevated   General bed mobility comments: extra time and effort, guarded movements, utilized bed rails.    Transfers Overall transfer level: Needs assistance Equipment used:  (IV pole) Transfers: Sit to/from Stand Sit to Stand: Min guard           General transfer comment: to/from EOB. slow, guarded movements.      Balance Overall balance assessment: Mild deficits observed, not formally tested                                         ADL either performed or assessed with clinical judgement   ADL Overall ADL's : Needs assistance/impaired Eating/Feeding: Set up;Sitting   Grooming: Set up;Sitting   Upper Body Bathing: Set up;Sitting   Lower Body Bathing: Minimal assistance;Sit to/from stand   Upper Body Dressing : Set up;Sitting   Lower Body Dressing: Minimal assistance;Sit to/from stand   Toilet Transfer: Min guard;Ambulation;Comfort height toilet Toilet Transfer Details (indicate cue type and reason): pushed IV pole Toileting- Clothing Manipulation and Hygiene: Min guard;Minimal assistance;Sitting/lateral lean;Sit to/from stand       Functional mobility during ADLs: Min guard General ADL Comments: Pt completed in room functional mobility at min guard level. Min A with LB ADLs d/t decreased access to feet from recent abdominal surgery     Vision         Perception     Praxis  Pertinent Vitals/Pain Pain Assessment Pain Assessment: 0-10 Pain Score: 6  Pain Location: surgical site Pain Descriptors / Indicators: Grimacing, Guarding, Operative site guarding Pain Intervention(s): Limited activity within patient's tolerance, Monitored during session, Repositioned, Ice applied, Other (comment) (oral pain meds given shortly before OT session)      Hand Dominance Right   Extremity/Trunk Assessment Upper Extremity Assessment Upper Extremity Assessment: Overall WFL for tasks assessed   Lower Extremity Assessment Lower Extremity Assessment: Defer to PT evaluation       Communication Communication Communication: No difficulties   Cognition Arousal/Alertness: Awake/alert Behavior During Therapy: WFL for tasks assessed/performed Overall Cognitive Status: Within Functional Limits for tasks assessed                                       General Comments       Exercises     Shoulder Instructions      Home Living Family/patient expects to be discharged to:: Private residence Living Arrangements: Children Available Help at Discharge: Available 24 hours/day Type of Home: Apartment Home Access: Stairs to enter [employer] of Steps: 3 Entrance Stairs-Rails: Right Home Layout: One level     Bathroom Shower/Tub: Teacher, early years/pre: Standard (comfort height)     Home Equipment: None          Prior Functioning/Environment Prior Level of Function : Independent/Modified Independent             Mobility Comments: pt reports independence, does al household tasks ADLs Comments: working at [employer]        OT Problem List: Decreased strength;Decreased activity tolerance;Impaired balance (sitting and/or standing);Decreased knowledge of use of DME or AE;Decreased knowledge of precautions;Pain      OT Treatment/Interventions: Self-care/ADL training;Energy conservation;DME and/or AE instruction;Therapeutic activities;Patient/family education;Balance training    OT Goals(Current goals can be found in the care plan section) Acute Rehab OT Goals Patient Stated Goal: not stated OT Goal Formulation: With patient Time For Goal Achievement: 04/26/22 Potential to Achieve Goals: Good ADL Goals Pt Will Perform Lower Body Bathing: with modified independence;sit to/from  stand;with adaptive equipment Pt Will Perform Lower Body Dressing: with modified independence;sit to/from stand;with adaptive equipment Pt Will Transfer to Toilet: with modified independence;ambulating Pt Will Perform Toileting - Clothing Manipulation and hygiene: with modified independence;sit to/from stand Pt Will Perform Tub/Shower Transfer: Tub transfer;ambulating;tub bench;with supervision  OT Frequency: Min 2X/week    Co-evaluation              AM-PAC OT "6 Clicks" Daily Activity     Outcome Measure Help from another person eating meals?: None Help from another person taking care of personal grooming?: None Help from another person toileting, which includes using toliet, bedpan, or urinal?: A Little Help from another person bathing (including washing, rinsing, drying)?: A Little Help from another person to put on and taking off regular upper body clothing?: None Help from another person to put on and taking off regular lower body clothing?: A Little 6 Click Score: 21   End of Session Equipment Utilized During Treatment: Other (comment) (pt pushed IV pole)  Activity Tolerance: Patient limited by pain;Patient tolerated treatment well Patient left: in bed;with call bell/phone within reach  OT Visit Diagnosis: Unsteadiness on feet (R26.81);Pain                Time: 1610-9604 OT Time Calculation (min): 14 min Charges:  OT General  Charges $OT Visit: 1 Visit OT Evaluation $OT Eval Low Complexity: [place], OT Acute Rehabilitation Services Office: [phone]   Hortencia Pilar 04/12/2022, 10:43 AM

## 2022-04-12 NOTE — Progress Notes (Signed)
3 Days Post-Op Procedure(s) (LRB): LAPAROSCOPY DIAGNOSTIC (N/A) HYSTERECTOMY ABDOMINAL BILATERAL SALPINGO OOPHORECTOMY WITH OMENTECTOMY ,DEBULKING (Bilateral)  Subjective: Patient reports doing well this am. Continues to tolerate diet with no nausea or emesis. Voiding large amounts and staying hydrated per pt. Abdominal pain is relieved with oral pain medication. Passing flatus. No BM reported. Continues to report intermittent hot flashes with normal temperature readings overnight. She ambulated in the hall yesterday and tolerated this well. Generalized weakness is improving. No dyspnea or chest pain. No concerns voiced.  Objective: Vital signs in last 24 hours: Temp:  [98.3 F (36.8 C)-98.6 F (37 C)] 98.3 F (36.8 C) (06/16 0600) Pulse Rate:  [92-105] 101 (06/16 0600) Resp:  [17-18] 17 (06/16 0600) BP: (131-147)/(58-63) 131/58 (06/16 0600) SpO2:  [95 %-100 %] 95 % (06/16 0600) Weight:  [174 lb 2.6 oz (79 kg)] 174 lb 2.6 oz (79 kg) (06/16 0310) Last BM Date : 04/11/22  Intake/Output from previous day: 06/15 0701 - 06/16 0700 In: 1320 [P.O.:1320] Out: 1650 [Urine:1650]  Physical Examination: General: alert, cooperative, and no distress Resp: clear to auscultation bilaterally Cardio: regular rate and rhythm, S1, S2 normal, no murmur, click, rub or gallop GI: incision: midline incision with op site dressing present with no drainage noted underneath and abdomen soft, non-tympanic, mildly distended, hypoactive bowel sounds (improved) Extremities: Trace bilateral pedal edema, non-pitting. No cyanosis. Right chest port-a-cath accessed-dressing intact  Labs: WBC/Hgb/Hct/Plts:  12.5/7.4/24.2/271 (06/16 0419) BUN/Cr/glu/ALT/AST/amyl/lip:  12/0.90/--/--/--/--/-- (06/16 0419)  Assessment: 71 y.o. s/p Procedure(s): LAPAROSCOPY DIAGNOSTIC HYSTERECTOMY ABDOMINAL BILATERAL SALPINGO OOPHORECTOMY WITH OMENTECTOMY ,DEBULKING: stable Pain:  Pain is well-controlled on PRN medications.  Heme:  Hgb 7.4 this am from 7.7 yesterday and Hct 24.2 this am from 24.8. S/P 1 unit PRBCs intraoperatively. Values expected given surgical losses and preop levels. Plan for an additional unit of PRBCs today. Has hx of iron deficiency anemia.   ID: No evidence of infection. Received IV antibiotics intraoperatively. WBC 12.5 this am- no obvious signs of infection. Urine sample ordered yesterday-has not been collected.  CV: BP and HR stable. Continue to monitor with ordered vital sign checks.  GI:  Tolerating po: Yes   Antiemetics ordered prn.  GU: Adequate amounts reported. Creatinine 0.90 from 0.80.    FEN: No critical values on am labs.   Endo: Diabetes mellitus Type II, under good control.  CBG: CBG (last 3)  Recent Labs    04/11/22 1643 04/11/22 2123 04/12/22 0728  GLUCAP 120* 109* 106*     Prophylaxis: SCDs and lovenox ordered.  Plan: Plan for 1 unit of PRBCs this am AM labs ordered Encourage IS use, increasing mobility PT/OT consult ordered 04/11/22 Continue plan of care per [name] When meeting milestones, patient will be discharged to home.   LOS: 3 days    Sherri Gill 04/12/2022, 7:46 AM

## 2022-04-12 NOTE — Progress Notes (Signed)
Noted patient to be tachycardic when obtaining pre blood vitals, MD made aware with orders to obtain EKG, EKG reveals A-fib with RVR.  MD made aware with new orders made and carried out including IV Cardizem.  Rapid Response RN at bedside to administer Cardizem as ordered.  Patient being transferred to room 1233, report given to [place], [place]

## 2022-04-12 NOTE — Progress Notes (Signed)
PT Cancellation Note  Patient Details Name: Sherri Gill MRN: [id number] DOB: Aug 09, 1951   Cancelled Treatment:    Reason Eval/Treat Not Completed: Medical issues which prohibited therapy, moving to tele and now having a test. Will check back tomorrow. [place] Pager [phone] Office [phone]     Claretha Cooper 04/12/2022, 1:27 PM

## 2022-04-12 NOTE — Telephone Encounter (Signed)
Spoke with [facility] outpatient pharmacy about patients prescription for Xarelto.  Patient is currently admitted and will start taking medication when she gets home. Pharmacy will deliver medication to patient in hospital before she goes home tomorrow.

## 2022-04-12 NOTE — Consult Note (Signed)
Initial Consultation Note   Patient: Sherri Gill ZDG:[id number] DOB: [birth year] PCP: [name], MD DOA: 04/09/2022 DOS: the patient was seen and examined on 04/12/2022 Primary service: Lafonda Mosses, MD  Referring physician:  Reason for consult:  New onset atrial fibrillation.  Assessment and Plan: HPI: Sherri Gill is a 71 y.o. female  with a past medical history of hypertension, type II DM, class I obesity, iron deficiency anemia, GERD, depression, endometrial cancer who just underwent hysterectomy with bilateral salpingo-oophorectomy with omentectomy and debulking who developed new onset of atrial fibrillation with a heart rate in the 130s.no tobacco, alcohol or drug use.  She drinks a couple coffee in the morning.  No chest pain, diaphoresis, PND, orthopnea or pitting edema of the lower extremities.  She denied fever, chills, rhinorrhea, sore throat, wheezing or hemoptysis.  Having some postop abdominal pain, but no nausea, emesis, diarrhea, constipation, melena or hematochezia.  No flank pain, dysuria, frequency or hematuria.  No polyuria, polydipsia, polyphagia or blurred vision.   Principal Problem:   Gynecologic malignancy [facility])   Endometrial cancer ([place]) Continue postop care per primary team.  Active Problems:   New onset atrial fibrillation (HCC) CHA2DS2-VASc Score of 5 out of 9 pending echo (Age, DM, female, aortic atherosclerosis, HTN) It is a considerable risk for embolic CVA.  Rate control with Cardizem 10 mg IVP x1. Transferred to PCU in case infusion needed. No anticoagulation for now. Begin diltiazem 30 mg p.o. every 6 hours. Beta-blocker IVP as needed. Keep electrolytes optimized. Check echocardiogram. Cardiology will see in the morning.    Elevated TSH history Check TSH level.    Essential hypertension Discontinue amlodipine to avoid reflex tachycardia. Begin diltiazem as above. Monitor blood pressure and heart rate.    Type 2 diabetes  mellitus with neurological complications (HCC) Carbohydrate modified diet. CBG monitoring before meals and bedtime. Continue metformin 1000 mg p.o. daily. Analgesics as needed for neuropathy.    Acute on chronic blood loss anemia Specifically:   Iron deficiency anemia At receiving PRBC transfusion now.    Dyslipidemia associated with type 2 diabetes mellitus (HCC) Continue atorvastatin 20 mg p.o. daily.  TRH will continue to follow the patient.  Review of Systems: As mentioned in the history of present illness. All other systems reviewed and are negative. Past Medical History:  Diagnosis Date   AKI (acute kidney injury) ([place])    History of   Ascites    09/2021 paracentesis with almost 3 L of fluid 12/2021 Paracentesis performed with 2.8 L   Chronic anemia 08/29/2012   Need colonoscopy or report. Need iron panel.    Dyslipidemia 06/30/2007   Dyspnea    Essential hypertension, benign 06/30/2007   GERD 06/30/2007   History of blood transfusion    History of fatty infiltration of liver    History of migraine    Hx of Herpes simplex meningitis 2015   Also noted to have primary empty sella on imaging at this admission   Insomnia 06/11/2012   Major depressive disorder, recurrent episode, moderate with anxious distress ([place]) 06/30/2007   Nausea and vomiting 01/13/2022   Obesity, BMI 35-40 06/11/2012   Peripheral neuropathy 2/2 T2DM 12/28/2009   Primary empty sella syndrome ([place]) 2015   Noted on imaging during hospitalization for herpes meningitis; no pituitary mass, no hormone w/u at that time, hormonally asymptomatic   Type 2 diabetes mellitus with neurological complications ([place]) 59/56/3875   Past Surgical History:  Procedure Laterality Date   COLONOSCOPY  W/ POLYPECTOMY  06/28/2004   DENTAL SURGERY     DILATATION & CURETTAGE/HYSTEROSCOPY WITH MYOSURE N/A 01/15/2022   Procedure: DILATATION & CURETTAGE/HYSTEROSCOPY WITH MYOSURE;  Surgeon: Lafonda Mosses, MD;  Location: [facility]  ORS;  Service: Gynecology;  Laterality: N/A;  DO NOT OPEN HYSTEROSCOPY KIT   HYSTERECTOMY ABDOMINAL WITH SALPINGO-OOPHORECTOMY Bilateral 04/09/2022   Procedure: HYSTERECTOMY ABDOMINAL BILATERAL SALPINGO OOPHORECTOMY WITH OMENTECTOMY ,DEBULKING;  Surgeon: Lafonda Mosses, MD;  Location: [facility] ORS;  Service: Gynecology;  Laterality: Bilateral;   IR IMAGING GUIDED PORT INSERTION  01/18/2022   LAPAROSCOPY N/A 04/09/2022   Procedure: LAPAROSCOPY DIAGNOSTIC;  Surgeon: Lafonda Mosses, MD;  Location: [facility] ORS;  Service: Gynecology;  Laterality: N/A;   OPERATIVE ULTRASOUND N/A 01/15/2022   Procedure: OPERATIVE ULTRASOUND;  Surgeon: Lafonda Mosses, MD;  Location: [facility] ORS;  Service: Gynecology;  Laterality: N/A;   port a cath placement     TUBAL LIGATION     age 17   Social History:  reports that she quit smoking about 43 years ago. Her smoking use included cigarettes. She has a 2.50 pack-year smoking history. She has never used smokeless tobacco. She reports that she does not drink alcohol and does not use drugs.  Allergies  Allergen Reactions   Codeine Sulfate Nausea And Vomiting and Other (See Comments)    Dizziness   Penicillins Nausea And Vomiting   Percocet [Oxycodone-Acetaminophen] Nausea And Vomiting   Zestril [Lisinopril] Cough    Family History  Adopted: Yes  Problem Relation Age of Onset   Dementia Mother    Breast cancer Sister        paternal half-sister   Liver cancer Brother        maternal half-brother   Thyroid cancer Maternal Aunt    Prostate cancer Maternal Uncle    Prostate cancer Cousin    Breast cancer Other    Ovarian cancer Neg Hx    Colon cancer Neg Hx    Endometrial cancer Neg Hx    Pancreatic cancer Neg Hx     Prior to Admission medications   Medication Sig Start Date End Date Taking? Authorizing Provider  acetaminophen (TYLENOL) 500 MG tablet Take 1,000 mg by mouth every 6 (six) hours as needed for mild pain.   Yes [provider]   amLODipine (NORVASC) 10 MG tablet Take 1 tablet (10 mg total) by mouth at bedtime. 03/13/22  Yes [name], MD  atorvastatin (LIPITOR) 20 MG tablet Take 1 tablet (20 mg total) by mouth at bedtime. 03/14/22 03/14/23 Yes [name], MD  B Complex-C (B-COMPLEX WITH VITAMIN C) tablet Take 1 tablet by mouth in the morning.   Yes [provider]  Blood Glucose Monitoring Suppl (ONE TOUCH ULTRA MINI) w/Device KIT Please use as directed. 12/20/16  Yes [name], MD  Cholecalciferol 1000 units capsule Take 1 capsule (1,000 Units total) by mouth daily. 11/08/16  Yes [name], MD  dexamethasone (DECADRON) 4 MG tablet Take 2 tablets by mouth the night before and 2 tablets the morning of chemotherapy, every 3 weeks for 6 cycles 03/08/22  Yes Gorsuch, Ni, MD  dorzolamide-timolol (COSOPT) 22.3-6.8 MG/ML ophthalmic solution Place 1 drop into both eyes 2 (two) times daily. 12/15/21  Yes [provider]  glucose blood (ONE TOUCH ULTRA TEST) test strip Use as instructed 12/20/16  Yes [name], MD  ibuprofen (ADVIL) 600 MG tablet Take 1 tablet by mouth every 8 hours as needed for moderate pain. For AFTER  surgery only 03/28/22  Yes Cross, Melissa D, NP  Insulin Pen Needle 32G X 4 MM MISC 1 each by Does not apply route daily. 09/01/19  Yes [name], Vahini, MD  latanoprost (XALATAN) 0.005 % ophthalmic solution Place 1 drop into both eyes at bedtime. 12/15/21  Yes [provider]  liraglutide (VICTOZA) 18 MG/3ML SOPN PLEASE START TAKING VICTOZA 0.6MG DAILY WEEK 1, THEN TAKE 1.2MG DAILY WEEK 2, AND THEN TAKE 1.8MG DAILY THEREAFTER Patient taking differently: Inject 1.2 mg into the skin daily. 05/31/21  Yes [name], [name], DO  losartan (COZAAR) 100 MG tablet Take 100 mg by mouth at bedtime. 12/31/21  Yes [provider]  magnesium oxide (MAG-OX) 400 (240 Mg) MG tablet Take 1 tablet by mouth 2 times daily. 03/21/22  Yes Gorsuch, Ni, MD  metFORMIN (GLUCOPHAGE) 1000 MG  tablet Take 1,000 mg by mouth 2 (two) times daily. 03/13/22  Yes [provider]  ondansetron (ZOFRAN) 8 MG tablet Take 1 tablet (8 mg total) by mouth 2 (two) times daily as needed. Start on the third day after chemotherapy. 01/15/22  Yes [name], MD  [facility] DELICA LANCETS 94M MISC Please use as directed. 12/20/16  Yes [name], MD  pantoprazole (PROTONIX) 40 MG tablet Take 1 tablet by mouth daily. 02/25/22  Yes Gorsuch, Ni, MD  polyethylene glycol powder (GLYCOLAX/MIRALAX) 17 GM/SCOOP powder Take 17 g by mouth daily. 03/28/22  Yes Cross, Melissa D, NP  rivaroxaban (XARELTO) 10 MG TABS tablet Take 1 tablet (10 mg total) by mouth daily. Start the day after discharge from hospital 04/14/22  Yes Cross, Lenna Sciara D, NP  albuterol (VENTOLIN HFA) 108 (90 Base) MCG/ACT inhaler Inhale 2 puffs into the lungs every 4 (four) hours as needed for wheezing or shortness of breath. 12/31/21   [name], MD  lidocaine-prilocaine (EMLA) cream Apply to affected area once 01/15/22   [name], MD  prochlorperazine (COMPAZINE) 10 MG tablet Take 1 tablet (10 mg total) by mouth every 6 (six) hours as needed (Nausea or vomiting). 01/15/22   [name], MD  senna-docusate (SENOKOT-S) 8.6-50 MG tablet Take 2 tablets by mouth at bedtime. For AFTER surgery, do not take if having diarrhea 03/28/22   [name] D, NP  traMADol (ULTRAM) 50 MG tablet Take 1 tablet by mouth every 6 hours as needed for severe pain. For AFTER surgery, do not take and drive 05/03/79   [name] D, NP    Physical Exam: Vitals:   04/12/22 0310 04/12/22 0600 04/12/22 1149 04/12/22 1212  BP:  (!) 131/58 134/66 132/71  Pulse:  (!) 101 (!) 122 (!) 128  Resp:  _0 Temp:  98.3 F (36.8 C) 98.3 F (36.8 C) 99.6 F (37.6 C)  TempSrc:  Oral Oral Oral  SpO2:  95% 94% 93%  Weight: 79 kg     Height: 5' 3" (1.6 m)      Physical Exam Vitals and nursing note reviewed.  Constitutional:      General: She is awake.  HENT:      Head: Normocephalic.     Mouth/Throat:     Mouth: Mucous membranes are moist.  Eyes:     General: No scleral icterus.    Pupils: Pupils are equal, round, and reactive to light.  Neck:     Vascular: No JVD.  Cardiovascular:     Rate and Rhythm: Tachycardia present. Rhythm irregular.     Heart sounds: S1 normal and S2 normal.  Pulmonary:  Effort: Pulmonary effort is normal. No respiratory distress.     Breath sounds: Normal breath sounds. No wheezing or rales.  Abdominal:     General: There is no distension.     Palpations: Abdomen is soft.  Musculoskeletal:     Cervical back: Neck supple.     Right lower leg: No edema.     Left lower leg: No edema.  Skin:    General: Skin is warm and dry.  Neurological:     General: No focal deficit present.     Mental Status: She is alert and oriented to person, place, and time.  Psychiatric:        Mood and Affect: Mood normal.        Behavior: Behavior normal.    Data Reviewed:   Results are pending, will review when available.   Family Communication:  Primary team communication:  Thank you very much for involving [place] in the care of your patient.  Author: Reubin Milan, MD 04/12/2022 1:49 PM  For on call review [email or web address].   This document was prepared using Dragon voice recognition software and may contain some unintended transcription errors.

## 2022-04-13 ENCOUNTER — Inpatient Hospital Stay (HOSPITAL_COMMUNITY): Payer: Medicare HMO

## 2022-04-13 DIAGNOSIS — R7989 Other specified abnormal findings of blood chemistry: Secondary | ICD-10-CM

## 2022-04-13 DIAGNOSIS — E1149 Type 2 diabetes mellitus with other diabetic neurological complication: Secondary | ICD-10-CM

## 2022-04-13 DIAGNOSIS — I4891 Unspecified atrial fibrillation: Secondary | ICD-10-CM

## 2022-04-13 DIAGNOSIS — E1169 Type 2 diabetes mellitus with other specified complication: Secondary | ICD-10-CM | POA: Diagnosis not present

## 2022-04-13 DIAGNOSIS — E785 Hyperlipidemia, unspecified: Secondary | ICD-10-CM

## 2022-04-13 DIAGNOSIS — D500 Iron deficiency anemia secondary to blood loss (chronic): Secondary | ICD-10-CM

## 2022-04-13 DIAGNOSIS — D62 Acute posthemorrhagic anemia: Secondary | ICD-10-CM

## 2022-04-13 DIAGNOSIS — C579 Malignant neoplasm of female genital organ, unspecified: Secondary | ICD-10-CM | POA: Diagnosis not present

## 2022-04-13 LAB — TYPE AND SCREEN
ABO/RH(D): O POS
Antibody Screen: NEGATIVE
Unit division: 0
Unit division: 0

## 2022-04-13 LAB — URINALYSIS, ROUTINE W REFLEX MICROSCOPIC
Bilirubin Urine: NEGATIVE
Glucose, UA: NEGATIVE mg/dL
Ketones, ur: NEGATIVE mg/dL
Leukocytes,Ua: NEGATIVE
Nitrite: NEGATIVE
Protein, ur: NEGATIVE mg/dL
Specific Gravity, Urine: 1.011 (ref 1.005–1.030)
pH: 5 (ref 5.0–8.0)

## 2022-04-13 LAB — CBC
HCT: 27.2 % — ABNORMAL LOW (ref 36.0–46.0)
Hemoglobin: 8.6 g/dL — ABNORMAL LOW (ref 12.0–15.0)
MCH: 27.7 pg (ref 26.0–34.0)
MCHC: 31.6 g/dL (ref 30.0–36.0)
MCV: 87.7 fL (ref 80.0–100.0)
Platelets: 319 10*3/uL (ref 150–400)
RBC: 3.1 MIL/uL — ABNORMAL LOW (ref 3.87–5.11)
RDW: 22.9 % — ABNORMAL HIGH (ref 11.5–15.5)
WBC: 15 10*3/uL — ABNORMAL HIGH (ref 4.0–10.5)
nRBC: 0 % (ref 0.0–0.2)

## 2022-04-13 LAB — GLUCOSE, CAPILLARY
Glucose-Capillary: 100 mg/dL — ABNORMAL HIGH (ref 70–99)
Glucose-Capillary: 106 mg/dL — ABNORMAL HIGH (ref 70–99)
Glucose-Capillary: 110 mg/dL — ABNORMAL HIGH (ref 70–99)
Glucose-Capillary: 121 mg/dL — ABNORMAL HIGH (ref 70–99)

## 2022-04-13 LAB — BPAM RBC
Blood Product Expiration Date: 202307062359
Blood Product Expiration Date: 202307062359
ISSUE DATE / TIME: 202306131101
ISSUE DATE / TIME: 202306161148
Unit Type and Rh: 5100
Unit Type and Rh: 5100

## 2022-04-13 LAB — BASIC METABOLIC PANEL
Anion gap: 5 (ref 5–15)
BUN: 15 mg/dL (ref 8–23)
CO2: 24 mmol/L (ref 22–32)
Calcium: 9 mg/dL (ref 8.9–10.3)
Chloride: 109 mmol/L (ref 98–111)
Creatinine, Ser: 1.06 mg/dL — ABNORMAL HIGH (ref 0.44–1.00)
GFR, Estimated: 57 mL/min — ABNORMAL LOW (ref >60)
Glucose, Bld: 109 mg/dL — ABNORMAL HIGH (ref 70–99)
Potassium: 4.1 mmol/L (ref 3.5–5.1)
Sodium: 138 mmol/L (ref 135–145)

## 2022-04-13 MED ORDER — ACETAMINOPHEN 325 MG PO TABS: 650.0000 mg | ORAL_TABLET | Freq: Four times a day (QID) | ORAL | Status: DC | PRN

## 2022-04-13 MED ORDER — SODIUM CHLORIDE 0.9 % IV SOLN: INTRAVENOUS | Status: AC

## 2022-04-13 MED ORDER — DILTIAZEM HCL ER COATED BEADS 120 MG PO CP24
120.0000 mg | ORAL_CAPSULE | Freq: Every day | ORAL | Status: DC
  Administered 2022-04-13 – 2022-04-15 (×3): 120 mg via ORAL
  Filled 2022-04-13 (×3): qty 1

## 2022-04-13 NOTE — Progress Notes (Signed)
PROGRESS NOTE    Sherri Gill  QBV:[id number] DOB: 17-Oct-1951 DOA: 04/09/2022 PCP: [name], MD   Brief Narrative:  The patient is a obese 71 year old African-American female with a past medical history significant for but not limited to hypertension, diabetes mellitus type 2, iron deficiency anemia, GERD, depression and anxiety as well as endometrial cancer who just underwent hysterectomy and bilateral salpingo-oophorectomy with omentectomy and debulking who developed new onset atrial fibrillation with heart rates in the 130s.  She denied any alcohol tobacco or drug use and she states that she drank coffee in the morning.  Postoperatively she had this A-fib with RVR and states that her heart was racing.  TRH was asked to evaluate for his new onset atrial fibrillation and she was given Cardizem which helped her with rate control.  Cardiology was also consulted they reviewed the telemetry and she has converted back to sinus rhythm and maintained this overnight.  Echocardiogram was done and showed a normal systolic function and cardiology recommends converting her short acting diltiazem to once a day Cardizem CD 124 her to stay on that.  Currently she not anticoagulated due to her recent acute on chronic anemia with requirement for transfusion but cardiology recommending a 30-day monitor after discharge if she has any recurrent A-fib or if it is procedure related and recommending holding off anticoagulation at this time.    Assessment and Plan:  Gynecologic malignancy [facility]) Endometrial cancer ([place]) -Continue postop care per primary team and primary management per them   New onset atrial fibrillation ([place]) -CHA2DS2-VASc Score of 5 out of 9  (Age, DM, female, aortic atherosclerosis, HTN) -It is a considerable risk for embolic CVA.  -Rate control with Cardizem 10 mg IVP x1. -Transferred to PCU in case infusion needed. -No anticoagulation for now given her recent acute on chronic  anemia. -Begin diltiazem 30 mg p.o. every 6 hours and cardiology is consolidated to Cardizem CD 120 mg p.o. daily. -Beta-blocke with metoprolol tartrate 5 mg IV every 6 as needed for sustained heart rate greater than 105 -Keep electrolytes optimized. -ECHOCardiogram was obtained and showed EF of 60-65% with no wall motion abnormalities with LV Diastolic Parameters are indeterminate     Elevated TSH history -Checked TSH level and was 3.817   Essential Hypertension -Discontinue amlodipine to avoid reflex tachycardia. -Begin diltiazem as above and now consolidate to Cardizem CD 120 mg Daily. -Continue to Monitor blood pressure and heart rate. -Last BP reading was 128/69   Type 2 diabetes mellitus with neurological complications (HCC) -Carbohydrate modified diet. -CBG monitoring before meals and bedtime. -Continue metformin 1000 mg p.o. daily. -Analgesics as needed for neuropathy. -Monitor CBGs per protocol; CBGs ranging from 100-129  AKI -Patient's BUN/Cr went from 14/0.71 -> 13/0.80 -> 12/0.90 -> 15/1.06 -Will give NS 75 mL/hr x12 hours -Continue to Monitor function carefully and avoid nephrotoxic medications, contrast dyes, hypotension and dehydration and renally adjust medications -Recommend Judicious use of NSAIDS -Repeat CMP in a.m.  Leukocytosis -Likely reactive but is trending up and WBC went from 9.4 -> 11.3 -> 12.7 -> 12.5 -> 15.0  -Checking blood cultures x2 and the primary team is ordered urinalysis and Chest x-ray -Getting an incentive spirometer -MRSA PCR was negative -Continue to monitor for signs and symptoms of infection -Urinalysis was done and showed clear appearance with negative bilirubin, yellow color urine, moderate hemoglobin, negative ketones, negative nitrites, negative leukocytes, rare bacteria, 6-10 RBCs per high-power field and 0-5 WBCs -Chest x-ray showed "Low volume study  with peribronchial thickening bilaterally. No definite airspace  disease." -Follow-up on blood cultures and continue to monitor and repeat CBC in AM  Acute on Chronic Blood Loss Anemia Iron Deficiency Anemia -Received PRBC transfusion -Patient's hemoglobin/hematocrit went from 8.9/27.8 -> 7.7/24.8 -> 8.5/27.2 -> 7.4/24.2 -> 9.2/29.2 -> 8.6/27.2 -Continue to monitor for signs and symptoms of bleeding; no overt bleeding noted -Repeat CMP in the AM    Dyslipidemia associated with type 2 diabetes mellitus (HCC) -Continue atorvastatin 20 mg p.o. daily.  Obesity -Complicates overall prognosis and care -Estimated body mass index is 33.27 kg/m as calculated from the following:   Height as of this encounter: '5\' 3"'$  (1.6 m).   Weight as of this encounter: 85.2 kg.  -Weight Loss and Dietary Counseling given  DVT prophylaxis: enoxaparin (LOVENOX) injection 40 mg Start: 04/10/22 1200 SCDs Start: 04/09/22 1445    Code Status: Full Code Family Communication: No family present at bedside  Disposition Plan:  Level of care: Progressive Status is: Inpatient Remains inpatient appropriate because: Needs Clearance by Primary   Consultants:  [facility] (Primary)  Procedures:  Diagnostic laparoscopy, conversion to exploratory laparotomy, partial omentectomy, lysis of adhesions for approximately 60 minutes, total abdominal hysterectomy, bilateral salpingo-oophorectomy, excision of ileal tumor implant and excision of transverse colon epiploica tumor implant  Antimicrobials:  Anti-infectives (From admission, onward)    Start     Dose/Rate Route Frequency Ordered Stop   04/09/22 1149  ceFAZolin (ANCEF) 2-4 GM/100ML-% IVPB       Note to Pharmacy: Jasmine Pang K: cabinet override      04/09/22 1149 04/09/22 2359   04/09/22 0600  ceFAZolin (ANCEF) IVPB 2g/100 mL premix        2 g 200 mL/hr over 30 Minutes Intravenous On call to O.R. 04/09/22 0506 04/09/22 1201       Subjective: Seen and examined at bedside and thinks she is doing better and  happy that her heart rate was stable in she states that he has not like it was.  No nausea or vomiting.  States the pain is okay.  No other concerns or points at this time.  Objective: Vitals:   04/13/22 0047 04/13/22 0156 04/13/22 0500 04/13/22 1043  BP: 135/67 (!) 130/58 (!) 130/55 128/69  Pulse: 77 77  87  Resp: '17 20 17 17  '$ Temp: 97.7 F (36.5 C) 97.7 F (36.5 C) 98.6 F (37 C) 97.9 F (36.6 C)  TempSrc: Oral Oral Oral Oral  SpO2: 96% 94% 93% 96%  Weight:      Height:        Intake/Output Summary (Last 24 hours) at 04/13/2022 1309 Last data filed at 04/13/2022 1041 Gross per 24 hour  Intake 571.67 ml  Output 350 ml  Net 221.67 ml   Filed Weights   04/12/22 0310 04/12/22 2139  Weight: 79 kg 85.2 kg   Examination: Physical Exam:  Constitutional: WN/WD obese AAF in NAD Appears calm  Respiratory: Diminished to auscultation bilaterally, no wheezing, rales, rhonchi or crackles. Normal respiratory effort and patient is not tachypenic. No accessory muscle use.  Cardiovascular: RRR, no murmurs / rubs / gallops. S1 and S2 auscultated.  Abdomen: Soft, non-tender, Distended 2/2 body habitus. Bowel sounds positive.  GU: Deferred. Musculoskeletal: No clubbing / cyanosis of digits/nails. No joint deformity upper and lower extremities.  Neurologic: CN 2-12 grossly intact with no focal deficits. Romberg sign and cerebellar reflexes not assessed.  Psychiatric: Normal judgment and insight. Alert and oriented x 3. Normal mood  and appropriate affect.   Data Reviewed: I have personally reviewed following labs and imaging studies  CBC: Recent Labs  Lab 04/10/22 0838 04/11/22 0228 04/11/22 1304 04/12/22 0419 04/12/22 1815 04/13/22 0604  WBC 9.4 11.3* 12.7* 12.5*  --  15.0*  HGB 8.9* 7.7* 8.5* 7.4* 9.2* 8.6*  HCT 27.8* 24.8* 27.2* 24.2* 29.2* 27.2*  MCV 84.5 87.0 87.2 88.0  --  87.7  PLT 330 296 331 271  --  295   Basic Metabolic Panel: Recent Labs  Lab 04/09/22 1930  04/10/22 0838 04/11/22 0228 04/12/22 0419 04/12/22 1504 04/13/22 0604  NA 139 134* 135 135  --  138  K 4.1 4.1 4.3 4.1  --  4.1  CL 105 102 104 104  --  109  CO2 '25 26 25 25  '$ --  24  GLUCOSE 170* 148* 153* 122*  --  109*  BUN '17 14 13 12  '$ --  15  CREATININE 0.79 0.71 0.80 0.90  --  1.06*  CALCIUM 9.1 8.8* 8.4* 8.6*  --  9.0  MG  --   --   --   --  1.7  --    GFR: Estimated Creatinine Clearance: 52.5 mL/min (A) (by C-G formula based on SCr of 1.06 mg/dL (H)). Liver Function Tests: No results for input(s): "AST", "ALT", "ALKPHOS", "BILITOT", "PROT", "ALBUMIN" in the last 168 hours. No results for input(s): "LIPASE", "AMYLASE" in the last 168 hours. No results for input(s): "AMMONIA" in the last 168 hours. Coagulation Profile: No results for input(s): "INR", "PROTIME" in the last 168 hours. Cardiac Enzymes: No results for input(s): "CKTOTAL", "CKMB", "CKMBINDEX", "TROPONINI" in the last 168 hours. BNP (last 3 results) No results for input(s): "PROBNP" in the last 8760 hours. HbA1C: No results for input(s): "HGBA1C" in the last 72 hours. CBG: Recent Labs  Lab 04/12/22 1418 04/12/22 1644 04/12/22 2115 04/13/22 0811 04/13/22 1206  GLUCAP 135* 129* 103* 100* 106*   Lipid Profile: No results for input(s): "CHOL", "HDL", "LDLCALC", "TRIG", "CHOLHDL", "LDLDIRECT" in the last 72 hours. Thyroid Function Tests: Recent Labs    04/12/22 1504  TSH 3.817   Anemia Panel: No results for input(s): "VITAMINB12", "FOLATE", "FERRITIN", "TIBC", "IRON", "RETICCTPCT" in the last 72 hours. Sepsis Labs: No results for input(s): "PROCALCITON", "LATICACIDVEN" in the last 168 hours.  Recent Results (from the past 240 hour(s))  Urine Culture     Status: None   Collection Time: 04/09/22  8:01 AM   Specimen: Urine, Catheterized  Result Value Ref Range Status   Specimen Description   Final    URINE, CATHETERIZED Performed at [facility] [address], [place],  [place] [address]    Special Requests   Final    NONE Performed at [facility], [place] [address], [place], [place] [address]    Culture   Final    NO GROWTH Performed at [facility], [place] [address] [place], [place] [address]    Report Status 04/10/2022 FINAL  Final  MRSA Next Gen by PCR, Nasal     Status: None   Collection Time: 04/12/22  2:20 PM   Specimen: Nasal Mucosa; Nasal Swab  Result Value Ref Range Status   MRSA by PCR Next Gen NOT DETECTED NOT DETECTED Final    Comment: (NOTE) The GeneXpert MRSA Assay (FDA approved for NASAL specimens only), is one component of a comprehensive MRSA colonization surveillance program. It is not intended to diagnose MRSA infection nor to guide  or monitor treatment for MRSA infections. Test performance is not FDA approved in patients less than 12 years old. Performed at [facility], [place] [address] [place], [place] [address]      Radiology Studies: DG Chest 2 View  Result Date: 04/13/2022 CLINICAL DATA:  Leukocytosis.  Recent abdominal surgery. EXAM: CHEST - 2 VIEW COMPARISON:  12/30/2021 and prior studies FINDINGS: Cardiomediastinal silhouette is unchanged. A RIGHT Port-A-Cath is present with tip overlying the UPPER RIGHT atrium. This is a low volume study with peribronchial thickening bilaterally noted. No definite airspace disease, pleural effusion or pneumothorax noted. No significant abnormalities identified in the visualized UPPER abdomen. There is no evidence of gross pneumoperitoneum. IMPRESSION: Low volume study with peribronchial thickening bilaterally. No definite airspace disease. Electronically Signed   By: Margarette Canada M.D.   On: 04/13/2022 12:37   ECHOCARDIOGRAM COMPLETE  Result Date: 04/12/2022    ECHOCARDIOGRAM REPORT   Patient Name:   Sherri Gill Date of Exam: 04/12/2022 Medical Rec #:  [id number]        Height:       63.0 in Accession #:    [id number]       Weight:       174.2 lb Date of  Birth:  06-30-1951         BSA:          1.823 m Patient Age:    76 years         BP:           132/71 mmHg Patient Gender: F                HR:           128 bpm. Exam Location:  Inpatient Procedure: 2D Echo, Cardiac Doppler and Color Doppler Indications:    Atrial Fibrillation I48.91  History:        Patient has no prior history of Echocardiogram examinations.                 Risk Factors:Hypertension and Diabetes. Anemia. GERD. New onset                 of Afib post surgery.  Sonographer:    Darlina Sicilian RDCS Referring Phys: [id number] DAVID MANUEL [place]  1. Left ventricular ejection fraction, by estimation, is 60 to 65%. The left ventricle has normal function. The left ventricle has no regional wall motion abnormalities. Left ventricular diastolic parameters are indeterminate.  2. Right ventricular systolic function is normal. The right ventricular size is normal. There is moderately elevated pulmonary artery systolic pressure.  3. The pericardial effusion is posterior to the left ventricle.  4. The mitral valve is normal in structure. No evidence of mitral valve regurgitation. No evidence of mitral stenosis.  5. The aortic valve is normal in structure. Aortic valve regurgitation is not visualized. No aortic stenosis is present.  6. The inferior vena cava is normal in size with greater than 50% respiratory variability, suggesting right atrial pressure of 3 mmHg. FINDINGS  Left Ventricle: Left ventricular ejection fraction, by estimation, is 60 to 65%. The left ventricle has normal function. The left ventricle has no regional wall motion abnormalities. The left ventricular internal cavity size was normal in size. There is  no left ventricular hypertrophy. Left ventricular diastolic parameters are indeterminate. Right Ventricle: The right ventricular size is normal. No increase in right ventricular wall thickness. Right ventricular systolic function is normal. There is moderately elevated pulmonary  artery systolic pressure. The tricuspid regurgitant velocity is 3.44 m/s, and with an assumed right atrial pressure of 3 mmHg, the estimated right ventricular systolic pressure is 59.5 mmHg. Left Atrium: Left atrial size was normal in size. Right Atrium: Right atrial size was normal in size. Pericardium: Trivial pericardial effusion is present. The pericardial effusion is posterior to the left ventricle. Mitral Valve: The mitral valve is normal in structure. No evidence of mitral valve regurgitation. No evidence of mitral valve stenosis. Tricuspid Valve: The tricuspid valve is normal in structure. Tricuspid valve regurgitation is mild . No evidence of tricuspid stenosis. Aortic Valve: The aortic valve is normal in structure. Aortic valve regurgitation is not visualized. No aortic stenosis is present. Pulmonic Valve: The pulmonic valve was normal in structure. Pulmonic valve regurgitation is not visualized. No evidence of pulmonic stenosis. Aorta: The aortic root is normal in size and structure. Venous: The inferior vena cava is normal in size with greater than 50% respiratory variability, suggesting right atrial pressure of 3 mmHg. IAS/Shunts: No atrial level shunt detected by color flow Doppler.  LEFT VENTRICLE PLAX 2D LVIDd:         4.30 cm   Diastology LVIDs:         2.20 cm   LV e' medial:    7.94 cm/s LV PW:         0.90 cm   LV E/e' medial:  9.6 LV IVS:        0.90 cm   LV e' lateral:   9.57 cm/s LVOT diam:     1.70 cm   LV E/e' lateral: 8.0 LV SV:         52 LV SV Index:   29 LVOT Area:     2.27 cm  RIGHT VENTRICLE RV S prime:     18.00 cm/s TAPSE (M-mode): 2.3 cm LEFT ATRIUM             Index LA diam:        3.70 cm 2.03 cm/m LA Vol (A2C):   38.5 ml 21.12 ml/m LA Vol (A4C):   29.7 ml 16.29 ml/m LA Biplane Vol: 34.2 ml 18.76 ml/m  AORTIC VALVE LVOT Vmax:   125.00 cm/s LVOT Vmean:  77.800 cm/s LVOT VTI:    0.230 m  AORTA Ao Root diam: 2.40 cm Ao Asc diam:  2.70 cm MITRAL VALVE               TRICUSPID  VALVE MV Area (PHT): 4.15 cm    TR Peak grad:   47.3 mmHg MV Decel Time: 183 msec    TR Vmax:        344.00 cm/s MV E velocity: 76.30 cm/s MV A velocity: 72.40 cm/s  SHUNTS MV E/A ratio:  1.05        Systemic VTI:  0.23 m                            Systemic Diam: 1.70 cm Jenkins Rouge MD Electronically signed by Jenkins Rouge MD Signature Date/Time: 04/12/2022/2:18:28 PM    Final     Scheduled Meds:  atorvastatin  20 mg Oral QHS   chewing gum (ORBIT) sugar free  1 Stick Oral TID WC   Chlorhexidine Gluconate Cloth  6 each Topical Daily   diltiazem  120 mg Oral Daily   dorzolamide-timolol  1 drop Both Eyes BID   enoxaparin (LOVENOX) injection  40 mg Subcutaneous Q24H  feeding supplement (GLUCERNA SHAKE)  237 mL Oral TID BM   ibuprofen  600 mg Oral Q6H   insulin aspart  0-15 Units Subcutaneous TID WC   insulin aspart  0-5 Units Subcutaneous QHS   latanoprost  1 drop Both Eyes QHS   liraglutide  1.2 mg Subcutaneous Daily   metFORMIN  1,000 mg Oral BID WC   pantoprazole  40 mg Oral Daily   polyethylene glycol  17 g Oral Daily   senna-docusate  2 tablet Oral QHS   sodium chloride flush  10-40 mL Intracatheter Q12H   Continuous Infusions:   LOS: 4 days   Raiford Noble, DO [facility] Available via Epic secure chat 7am-7pm After these hours, please refer to coverage provider listed on [email or web address] 04/13/2022, 1:09 PM

## 2022-04-13 NOTE — Progress Notes (Addendum)
4 Days Post-Op Procedure(s) (LRB): LAPAROSCOPY DIAGNOSTIC (N/A) HYSTERECTOMY ABDOMINAL BILATERAL SALPINGO OOPHORECTOMY WITH OMENTECTOMY ,DEBULKING (Bilateral)  Subjective: Patient reports doing well. Pain improved from yesterday. Ambulated yesterday with walker. + flatus, + BM today. Minimal spotting this morning, no vaginal bleeding. Still decreased appetite, denies nausea or emesis. No further feeling/hearing heart beat since diltiazem started. Denies any further feelings of "hot flashes" yesterday or overnight.  Objective: Vital signs in last 24 hours: Temp:  [97.7 F (36.5 C)-99.6 F (37.6 C)] 98.6 F (37 C) (06/17 0500) Pulse Rate:  [52-128] 77 (06/17 0156) Resp:  [13-23] 17 (06/17 0500) BP: (123-154)/(52-71) 130/55 (06/17 0500) SpO2:  [93 %-96 %] 93 % (06/17 0500) Weight:  [187 lb 12.8 oz (85.2 kg)] 187 lb 12.8 oz (85.2 kg) (06/16 2139) Last BM Date : 04/11/22  Intake/Output from previous day: 06/16 0701 - 06/17 0700 In: 931.7 [P.O.:600; I.V.:16.7; Blood:315] Out: 200 [Urine:200]  Physical Examination: Gen: alert, cooperative, and no distress Pulm: clear to auscultation bilaterally, no wheezes or rhonchi CV: regular rate and rhythm, no murmurs or rubs Abd: midline incision with honeycomb dressing present, c/d/I; abdomen soft, non-tympanic, mildly distended, hypoactive bowel sounds, appropriately tender to palpation Ext: trace bilateral pedal edema, non-pitting, no calf tenderness, SCDs in place  Labs:    Latest Ref Rng & Units 04/13/2022    6:04 AM 04/12/2022    6:15 PM 04/12/2022    4:19 AM  CBC  WBC 4.0 - 10.5 K/uL 15.0   12.5   Hemoglobin 12.0 - 15.0 g/dL 8.6  9.2  7.4   Hematocrit 36.0 - 46.0 % 27.2  29.2  24.2   Platelets 150 - 400 K/uL 319   271       Latest Ref Rng & Units 04/12/2022    4:19 AM 04/11/2022    2:28 AM 04/10/2022    8:38 AM  BMP  Glucose 70 - 99 mg/dL 122  153  148   BUN 8 - 23 mg/dL '12  13  14   '$ Creatinine 0.44 - 1.00 mg/dL 0.90  0.80  0.71    Sodium 135 - 145 mmol/L 135  135  134   Potassium 3.5 - 5.1 mmol/L 4.1  4.3  4.1   Chloride 98 - 111 mmol/L 104  104  102   CO2 22 - 32 mmol/L '25  25  26   '$ Calcium 8.9 - 10.3 mg/dL 8.6  8.4  8.8      Assessment:  71 y.o. s/p Procedure(s): LAPAROSCOPY DIAGNOSTIC HYSTERECTOMY ABDOMINAL BILATERAL SALPINGO OOPHORECTOMY WITH OMENTECTOMY ,DEBULKING: meeting postoperative milestones, mildly increasing leukocytosis.  Pain:  Well controlled. Continue oral regimen.  Heme: Acute on chronic anemia. Appropriate response to transfusion yesterday (7.4->8.6). Asymptomatic when ambulating.   ID: Increasing leukocytosis. Patient without symptoms suspicious for infection. Remains afebrile. Plan for UA and CXR this am. Continued to encourage IS use.   CV: New onset a fib - appreciate medicine's assistance. Cardiology to see today. ECHO performed yesterday. Continue holding on tx dosing anticoagulation given recent surgery, continue prophylactic lovenox and SCDs.  GI:  Tolerating PO. Continue diabetic diet. Has had return of bowel function.  FEN: Nutritional supplements ordered.   Endo: Good glycemic control, continue current regimen.  Prophylaxis: lovenox, SCDs.  Plan: Continue routine postop care. Work-up leukocytosis. The patient is to be discharged to home, anticipate in next 1-3 days.   LOS: 4 days    Sherri Gill 04/13/2022, 6:40 AM

## 2022-04-13 NOTE — Hospital Course (Addendum)
The patient is a obese 71 year old African-American female with a past medical history significant for but not limited to hypertension, diabetes mellitus type 2, iron deficiency anemia, GERD, depression and anxiety as well as endometrial cancer who just underwent hysterectomy and bilateral salpingo-oophorectomy with omentectomy and debulking who developed new onset atrial fibrillation with heart rates in the 130s.  She denied any alcohol tobacco or drug use and she states that she drank coffee in the morning.  Postoperatively she had this A-fib with RVR and states that her heart was racing.  TRH was asked to evaluate for his new onset atrial fibrillation and she was given Cardizem which helped her with rate control.  Cardiology was also consulted they reviewed the telemetry and she has converted back to sinus rhythm and maintained this overnight.  Echocardiogram was done and showed a normal systolic function and cardiology recommends converting her short acting diltiazem to once a day Cardizem CD 124 her to stay on that.  Currently she not anticoagulated due to her recent acute on chronic anemia with requirement for transfusion but cardiology recommending a 30-day monitor after discharge if she has any recurrent A-fib or if it is procedure related and recommending holding off anticoagulation at this time.  WBC is improving along with her renal function.  Mag was a little low so we will replete.  UA negative and chest x-ray negative for any evidence of infection.  Blood count remained stable and patient is likely being discharged tomorrow per Primary team.

## 2022-04-13 NOTE — Progress Notes (Signed)
Pt ambulated on hallway via walker before bedtime, tolerated well

## 2022-04-13 NOTE — Consult Note (Signed)
CONSULTATION NOTE   Patient Name: Sherri Gill Date of Encounter: 04/13/2022 Cardiologist: None Electrophysiologist: None Advanced Heart Failure: None   Chief Complaint   New afib  Patient Profile   71 yo female with gynecologic malignancy, present with new onset afib with RVR  HPI   Sherri Gill is a 71 y.o. female who is being seen today for the evaluation of afib at the request of [name]. This is a 71 year old female no prior cardiac history, presents after recent hysterectomy and bilateral salpingo-oophorectomy for adenocarcinoma.  She developed new onset atrial fibrillation.  Seen yesterday by hospital medicine and started on 30 mg every 6 hours diltiazem for rate control.  Echocardiogram was ordered and performed yesterday which showed LVEF 60 to 65%, trivial posterior pericardial effusion and normal biatrial size.  Her CHA2DS2-VASc score is elevated at 5.  Anticoagulation was not initiated given recent surgery and she was noted to have acute on chronic blood loss anemia/iron deficiency.  She was transfused.  BNP was low at 98.9, troponin negative x2.  Telemetry review today appears that she is converted to sinus rhythm and has maintained that overnight.   PMHx   Past Medical History:  Diagnosis Date   AKI (acute kidney injury) ([place])    History of   Ascites    09/2021 paracentesis with almost 3 L of fluid 12/2021 Paracentesis performed with 2.8 L   Chronic anemia 08/29/2012   Need colonoscopy or report. Need iron panel.    Dyslipidemia 06/30/2007   Dyspnea    Essential hypertension, benign 06/30/2007   GERD 06/30/2007   History of blood transfusion    History of fatty infiltration of liver    History of migraine    Hx of Herpes simplex meningitis 2015   Also noted to have primary empty sella on imaging at this admission   Insomnia 06/11/2012   Major depressive disorder, recurrent episode, moderate with anxious distress ([place]) 06/30/2007   Nausea and  vomiting 01/13/2022   Obesity, BMI 35-40 06/11/2012   Peripheral neuropathy 2/2 T2DM 12/28/2009   Primary empty sella syndrome ([place]) 2015   Noted on imaging during hospitalization for herpes meningitis; no pituitary mass, no hormone w/u at that time, hormonally asymptomatic   Type 2 diabetes mellitus with neurological complications ([place]) 37/29/0211    Past Surgical History:  Procedure Laterality Date   COLONOSCOPY W/ POLYPECTOMY  06/28/2004   DENTAL SURGERY     DILATATION & CURETTAGE/HYSTEROSCOPY WITH MYOSURE N/A 01/15/2022   Procedure: DILATATION & CURETTAGE/HYSTEROSCOPY WITH MYOSURE;  Surgeon: Lafonda Mosses, MD;  Location: [facility];  Service: Gynecology;  Laterality: N/A;  DO NOT OPEN HYSTEROSCOPY KIT   HYSTERECTOMY ABDOMINAL WITH SALPINGO-OOPHORECTOMY Bilateral 04/09/2022   Procedure: HYSTERECTOMY ABDOMINAL BILATERAL SALPINGO OOPHORECTOMY WITH OMENTECTOMY ,DEBULKING;  Surgeon: Lafonda Mosses, MD;  Location: [facility];  Service: Gynecology;  Laterality: Bilateral;   IR IMAGING GUIDED PORT INSERTION  01/18/2022   LAPAROSCOPY N/A 04/09/2022   Procedure: LAPAROSCOPY DIAGNOSTIC;  Surgeon: Lafonda Mosses, MD;  Location: [facility];  Service: Gynecology;  Laterality: N/A;   OPERATIVE ULTRASOUND N/A 01/15/2022   Procedure: OPERATIVE ULTRASOUND;  Surgeon: Lafonda Mosses, MD;  Location: [facility];  Service: Gynecology;  Laterality: N/A;   port a cath placement     TUBAL LIGATION     age 60    FAMHx   Family History  Adopted: Yes  Problem Relation Age of Onset   Dementia Mother    Breast cancer Sister  paternal half-sister   Liver cancer Brother        maternal half-brother   Thyroid cancer Maternal Aunt    Prostate cancer Maternal Uncle    Prostate cancer Cousin    Breast cancer Other    Ovarian cancer Neg Hx    Colon cancer Neg Hx    Endometrial cancer Neg Hx    Pancreatic cancer Neg Hx     SOCHx    reports that she quit smoking about 43 years ago. Her smoking  use included cigarettes. She has a 2.50 pack-year smoking history. She has never used smokeless tobacco. She reports that she does not drink alcohol and does not use drugs.  Outpatient Medications   No current facility-administered medications on file prior to encounter.   Current Outpatient Medications on File Prior to Encounter  Medication Sig Dispense Refill   acetaminophen (TYLENOL) 500 MG tablet Take 1,000 mg by mouth every 6 (six) hours as needed for mild pain.     amLODipine (NORVASC) 10 MG tablet Take 1 tablet (10 mg total) by mouth at bedtime. 90 tablet 3   atorvastatin (LIPITOR) 20 MG tablet Take 1 tablet (20 mg total) by mouth at bedtime. 30 tablet 11   B Complex-C (B-COMPLEX WITH VITAMIN C) tablet Take 1 tablet by mouth in the morning.     Blood Glucose Monitoring Suppl (ONE TOUCH ULTRA MINI) w/Device KIT Please use as directed. 1 each 0   Cholecalciferol 1000 units capsule Take 1 capsule (1,000 Units total) by mouth daily. 90 capsule 1   dexamethasone (DECADRON) 4 MG tablet Take 2 tablets by mouth the night before and 2 tablets the morning of chemotherapy, every 3 weeks for 6 cycles 36 tablet 6   dorzolamide-timolol (COSOPT) 22.3-6.8 MG/ML ophthalmic solution Place 1 drop into both eyes 2 (two) times daily.     glucose blood (ONE TOUCH ULTRA TEST) test strip Use as instructed 100 each 1   Insulin Pen Needle 32G X 4 MM MISC 1 each by Does not apply route daily. 100 each 3   latanoprost (XALATAN) 0.005 % ophthalmic solution Place 1 drop into both eyes at bedtime.     liraglutide (VICTOZA) 18 MG/3ML SOPN PLEASE START TAKING VICTOZA 0.6MG DAILY WEEK 1, THEN TAKE 1.2MG DAILY WEEK 2, AND THEN TAKE 1.8MG DAILY THEREAFTER (Patient taking differently: Inject 1.2 mg into the skin daily.) 9 mL 3   losartan (COZAAR) 100 MG tablet Take 100 mg by mouth at bedtime.     magnesium oxide (MAG-OX) 400 (240 Mg) MG tablet Take 1 tablet by mouth 2 times daily. 60 tablet 1   metFORMIN (GLUCOPHAGE) 1000  MG tablet Take 1,000 mg by mouth 2 (two) times daily.     ondansetron (ZOFRAN) 8 MG tablet Take 1 tablet (8 mg total) by mouth 2 (two) times daily as needed. Start on the third day after chemotherapy. 30 tablet 1   ONETOUCH DELICA LANCETS 55H MISC Please use as directed. 100 each 0   pantoprazole (PROTONIX) 40 MG tablet Take 1 tablet by mouth daily. 30 tablet 1   albuterol (VENTOLIN HFA) 108 (90 Base) MCG/ACT inhaler Inhale 2 puffs into the lungs every 4 (four) hours as needed for wheezing or shortness of breath. 8 g 0   lidocaine-prilocaine (EMLA) cream Apply to affected area once 30 g 3   prochlorperazine (COMPAZINE) 10 MG tablet Take 1 tablet (10 mg total) by mouth every 6 (six) hours as needed (Nausea or vomiting). 30 tablet 1  Inpatient Medications    Scheduled Meds:  atorvastatin  20 mg Oral QHS   chewing gum (ORBIT) sugar free  1 Stick Oral TID WC   Chlorhexidine Gluconate Cloth  6 each Topical Daily   diltiazem  30 mg Oral Q6H   dorzolamide-timolol  1 drop Both Eyes BID   enoxaparin (LOVENOX) injection  40 mg Subcutaneous Q24H   feeding supplement (GLUCERNA SHAKE)  237 mL Oral TID BM   ibuprofen  600 mg Oral Q6H   insulin aspart  0-15 Units Subcutaneous TID WC   insulin aspart  0-5 Units Subcutaneous QHS   latanoprost  1 drop Both Eyes QHS   liraglutide  1.2 mg Subcutaneous Daily   metFORMIN  1,000 mg Oral BID WC   pantoprazole  40 mg Oral Daily   polyethylene glycol  17 g Oral Daily   senna-docusate  2 tablet Oral QHS   sodium chloride flush  10-40 mL Intracatheter Q12H    Continuous Infusions:   PRN Meds: acetaminophen, HYDROmorphone (DILAUDID) injection, HYDROmorphone, levalbuterol, metoprolol tartrate, ondansetron **OR** ondansetron (ZOFRAN) IV, mouth rinse, sodium chloride flush, traMADol   ALLERGIES   Allergies  Allergen Reactions   Codeine Sulfate Nausea And Vomiting and Other (See Comments)    Dizziness   Penicillins Nausea And Vomiting   Percocet  [Oxycodone-Acetaminophen] Nausea And Vomiting   Zestril [Lisinopril] Cough    ROS   Pertinent items noted in HPI and remainder of comprehensive ROS otherwise negative.  Vitals   Vitals:   04/12/22 2200 04/13/22 0047 04/13/22 0156 04/13/22 0500  BP:  135/67 (!) 130/58 (!) 130/55  Pulse:  77 77   Resp: 13 17 20 17   Temp:  97.7 F (36.5 C) 97.7 F (36.5 C) 98.6 F (37 C)  TempSrc:  Oral Oral Oral  SpO2:  96% 94% 93%  Weight:      Height:        Intake/Output Summary (Last 24 hours) at 04/13/2022 0919 Last data filed at 04/12/2022 1600 Gross per 24 hour  Intake 931.67 ml  Output 200 ml  Net 731.67 ml   Filed Weights   04/12/22 0310 04/12/22 2139  Weight: 79 kg 85.2 kg    Physical Exam   General appearance: alert and no distress Neck: no carotid bruit, no JVD, and thyroid not enlarged, symmetric, no tenderness/mass/nodules Lungs: clear to auscultation bilaterally Heart: regular rate and rhythm, S1, S2 normal, no murmur, click, rub or gallop Abdomen: soft, non-tender; bowel sounds normal; no masses,  no organomegaly Extremities: extremities normal, atraumatic, no cyanosis or edema Pulses: 2+ and symmetric Skin: Skin color, texture, turgor normal. No rashes or lesions Neurologic: Grossly normal Psych: Pleasant  Labs   Results for orders placed or performed during the hospital encounter of 04/09/22 (from the past 48 hour(s))  Glucose, capillary     Status: Abnormal   Collection Time: 04/11/22 11:49 AM  Result Value Ref Range   Glucose-Capillary 104 (H) 70 - 99 mg/dL    Comment: Glucose reference range applies only to samples taken after fasting for at least 8 hours.  CBC     Status: Abnormal   Collection Time: 04/11/22  1:04 PM  Result Value Ref Range   WBC 12.7 (H) 4.0 - 10.5 K/uL   RBC 3.12 (L) 3.87 - 5.11 MIL/uL   Hemoglobin 8.5 (L) 12.0 - 15.0 g/dL   HCT 27.2 (L) 36.0 - 46.0 %   MCV 87.2 80.0 - 100.0 fL   MCH 27.2 26.0 -  34.0 pg   MCHC 31.3 30.0 - 36.0  g/dL   RDW 24.3 (H) 11.5 - 15.5 %   Platelets 331 150 - 400 K/uL   nRBC 0.0 0.0 - 0.2 %    Comment: Performed at [facility], [place] [address] [place], [place] [address]  Glucose, capillary     Status: Abnormal   Collection Time: 04/11/22  4:43 PM  Result Value Ref Range   Glucose-Capillary 120 (H) 70 - 99 mg/dL    Comment: Glucose reference range applies only to samples taken after fasting for at least 8 hours.  Glucose, capillary     Status: Abnormal   Collection Time: 04/11/22  9:23 PM  Result Value Ref Range   Glucose-Capillary 109 (H) 70 - 99 mg/dL    Comment: Glucose reference range applies only to samples taken after fasting for at least 8 hours.  CBC     Status: Abnormal   Collection Time: 04/12/22  4:19 AM  Result Value Ref Range   WBC 12.5 (H) 4.0 - 10.5 K/uL   RBC 2.75 (L) 3.87 - 5.11 MIL/uL   Hemoglobin 7.4 (L) 12.0 - 15.0 g/dL   HCT 24.2 (L) 36.0 - 46.0 %   MCV 88.0 80.0 - 100.0 fL   MCH 26.9 26.0 - 34.0 pg   MCHC 30.6 30.0 - 36.0 g/dL   RDW 24.3 (H) 11.5 - 15.5 %   Platelets 271 150 - 400 K/uL   nRBC 0.0 0.0 - 0.2 %    Comment: Performed at [facility], [place] [address] [place], [place] [address]  Basic metabolic panel     Status: Abnormal   Collection Time: 04/12/22  4:19 AM  Result Value Ref Range   Sodium 135 135 - 145 mmol/L   Potassium 4.1 3.5 - 5.1 mmol/L   Chloride 104 98 - 111 mmol/L   CO2 25 22 - 32 mmol/L   Glucose, Bld 122 (H) 70 - 99 mg/dL    Comment: Glucose reference range applies only to samples taken after fasting for at least 8 hours.   BUN 12 8 - 23 mg/dL   Creatinine, Ser 0.90 0.44 - 1.00 mg/dL   Calcium 8.6 (L) 8.9 - 10.3 mg/dL   GFR, Estimated >60 >60 mL/min    Comment: (NOTE) Calculated using the CKD-EPI Creatinine Equation (2021)    Anion gap 6 5 - 15    Comment: Performed at [facility], [place] [address], [place], [place] [address]  Glucose, capillary     Status: Abnormal    Collection Time: 04/12/22  7:28 AM  Result Value Ref Range   Glucose-Capillary 106 (H) 70 - 99 mg/dL    Comment: Glucose reference range applies only to samples taken after fasting for at least 8 hours.  Prepare RBC (crossmatch)     Status: None   Collection Time: 04/12/22  9:05 AM  Result Value Ref Range   Order Confirmation      BB SAMPLE OR UNITS ALREADY AVAILABLE Performed at [place] [address], [place], [place] [address]   Glucose, capillary     Status: Abnormal   Collection Time: 04/12/22 11:37 AM  Result Value Ref Range   Glucose-Capillary 125 (H) 70 - 99 mg/dL    Comment: Glucose reference range applies only to samples taken after fasting for at least 8 hours.  Glucose, capillary     Status: Abnormal   Collection Time: 04/12/22  2:18 PM  Result Value Ref Range   Glucose-Capillary 135 (H) 70 - 99 mg/dL    Comment: Glucose reference range applies only to samples taken after fasting for at least 8 hours.  MRSA Next Gen by PCR, Nasal     Status: None   Collection Time: 04/12/22  2:20 PM   Specimen: Nasal Mucosa; Nasal Swab  Result Value Ref Range   MRSA by PCR Next Gen NOT DETECTED NOT DETECTED    Comment: (NOTE) The GeneXpert MRSA Assay (FDA approved for NASAL specimens only), is one component of a comprehensive MRSA colonization surveillance program. It is not intended to diagnose MRSA infection nor to guide or monitor treatment for MRSA infections. Test performance is not FDA approved in patients less than 17 years old. Performed at [facility], [place] [address] [place], [place] [address]   Magnesium     Status: None   Collection Time: 04/12/22  3:04 PM  Result Value Ref Range   Magnesium 1.7 1.7 - 2.4 mg/dL    Comment: Performed at [facility], [place] [address], [place], [place] [address]  Brain natriuretic peptide     Status: None   Collection Time: 04/12/22  3:04 PM  Result Value Ref Range   B  Natriuretic Peptide 98.9 0.0 - 100.0 pg/mL    Comment: Performed at [facility], [place] [address], [place], [place] [address]  TSH     Status: None   Collection Time: 04/12/22  3:04 PM  Result Value Ref Range   TSH 3.817 0.350 - 4.500 uIU/mL    Comment: Performed by a 3rd Generation assay with a functional sensitivity of <=0.01 uIU/mL. Performed at [facility], [place] [address] [place], [place] [address]   Troponin I (High Sensitivity)     Status: None   Collection Time: 04/12/22  3:04 PM  Result Value Ref Range   Troponin I (High Sensitivity) 4 <18 ng/L    Comment: (NOTE) Elevated high sensitivity troponin I (hsTnI) values and significant  changes across serial measurements may suggest ACS but many other  chronic and acute conditions are known to elevate hsTnI results.  Refer to the "Links" section for chest pain algorithms and additional  guidance. Performed at [facility], [place] [address] [place],  [address]   Glucose, capillary     Status: Abnormal   Collection Time: 04/12/22  4:44 PM  Result Value Ref Range   Glucose-Capillary 129 (H) 70 - 99 mg/dL    Comment: Glucose reference range applies only to samples taken after fasting for at least 8 hours.  Troponin I (High Sensitivity)     Status: None   Collection Time: 04/12/22  6:15 PM  Result Value Ref Range   Troponin I (High Sensitivity) 5 <18 ng/L    Comment: (NOTE) Elevated high sensitivity troponin I (hsTnI) values and significant  changes across serial measurements may suggest ACS but many other  chronic and acute conditions are known to elevate hsTnI results.  Refer to the "Links" section for chest pain algorithms and additional  guidance. Performed at [facility], [place] [address], [place],  [address]   Hemoglobin and hematocrit, blood     Status: Abnormal   Collection Time: 04/12/22  6:15 PM  Result Value Ref Range    Hemoglobin 9.2 (L) 12.0 - 15.0 g/dL   HCT 29.2 (L) 36.0 - 46.0 %    Comment: Performed at [facility], [address] [place], [place] [address]  Glucose, capillary     Status: Abnormal   Collection Time: 04/12/22  9:15 PM  Result Value Ref Range   Glucose-Capillary 103 (H) 70 - 99 mg/dL    Comment: Glucose reference range applies only to samples taken after fasting for at least 8 hours.  CBC     Status: Abnormal   Collection Time: 04/13/22  6:04 AM  Result Value Ref Range   WBC 15.0 (H) 4.0 - 10.5 K/uL   RBC 3.10 (L) 3.87 - 5.11 MIL/uL   Hemoglobin 8.6 (L) 12.0 - 15.0 g/dL   HCT 27.2 (L) 36.0 - 46.0 %   MCV 87.7 80.0 - 100.0 fL   MCH 27.7 26.0 - 34.0 pg   MCHC 31.6 30.0 - 36.0 g/dL   RDW 22.9 (H) 11.5 - 15.5 %   Platelets 319 150 - 400 K/uL   nRBC 0.0 0.0 - 0.2 %    Comment: Performed at [facility], [place] [address] [place], [place] [address]  Basic metabolic panel     Status: Abnormal   Collection Time: 04/13/22  6:04 AM  Result Value Ref Range   Sodium 138 135 - 145 mmol/L   Potassium 4.1 3.5 - 5.1 mmol/L   Chloride 109 98 - 111 mmol/L   CO2 24 22 - 32 mmol/L   Glucose, Bld 109 (H) 70 - 99 mg/dL    Comment: Glucose reference range applies only to samples taken after fasting for at least 8 hours.   BUN 15 8 - 23 mg/dL   Creatinine, Ser 1.06 (H) 0.44 - 1.00 mg/dL   Calcium 9.0 8.9 - 10.3 mg/dL   GFR, Estimated 57 (L) >60 mL/min    Comment: (NOTE) Calculated using the CKD-EPI Creatinine Equation (2021)    Anion gap 5 5 - 15    Comment: Performed at [facility], [place] [address] [place], [place] [address]  Glucose, capillary     Status: Abnormal   Collection Time: 04/13/22  8:11 AM  Result Value Ref Range   Glucose-Capillary 100 (H) 70 - 99 mg/dL    Comment: Glucose reference range applies only to samples taken after fasting for at least 8 hours.    ECG   Sinus rhythm at 83- Personally Reviewed  Telemetry    Sinus rhythm- Personally Reviewed  Radiology   ECHOCARDIOGRAM COMPLETE  Result Date: 04/12/2022    ECHOCARDIOGRAM REPORT   Patient Name:   Sherri Gill Date of Exam: 04/12/2022 Medical Rec #:  [id number]        Height:       63.0 in Accession #:    [id number]       Weight:       174.2 lb Date of Birth:  02-08-1951         BSA:          1.823 m Patient Age:    72 years         BP:           132/71 mmHg Patient Gender: F                HR:           128 bpm. Exam Location:  Inpatient Procedure: 2D Echo, Cardiac Doppler and Color Doppler Indications:    Atrial Fibrillation I48.91  History:        Patient has no prior history of Echocardiogram examinations.  Risk Factors:Hypertension and Diabetes. Anemia. GERD. New onset                 of Afib post surgery.  Sonographer:    Darlina Sicilian RDCS Referring Phys: [id number] DAVID MANUEL [place]  1. Left ventricular ejection fraction, by estimation, is 60 to 65%. The left ventricle has normal function. The left ventricle has no regional wall motion abnormalities. Left ventricular diastolic parameters are indeterminate.  2. Right ventricular systolic function is normal. The right ventricular size is normal. There is moderately elevated pulmonary artery systolic pressure.  3. The pericardial effusion is posterior to the left ventricle.  4. The mitral valve is normal in structure. No evidence of mitral valve regurgitation. No evidence of mitral stenosis.  5. The aortic valve is normal in structure. Aortic valve regurgitation is not visualized. No aortic stenosis is present.  6. The inferior vena cava is normal in size with greater than 50% respiratory variability, suggesting right atrial pressure of 3 mmHg. FINDINGS  Left Ventricle: Left ventricular ejection fraction, by estimation, is 60 to 65%. The left ventricle has normal function. The left ventricle has no regional wall motion abnormalities. The left ventricular internal cavity size was  normal in size. There is  no left ventricular hypertrophy. Left ventricular diastolic parameters are indeterminate. Right Ventricle: The right ventricular size is normal. No increase in right ventricular wall thickness. Right ventricular systolic function is normal. There is moderately elevated pulmonary artery systolic pressure. The tricuspid regurgitant velocity is 3.44 m/s, and with an assumed right atrial pressure of 3 mmHg, the estimated right ventricular systolic pressure is 03.5 mmHg. Left Atrium: Left atrial size was normal in size. Right Atrium: Right atrial size was normal in size. Pericardium: Trivial pericardial effusion is present. The pericardial effusion is posterior to the left ventricle. Mitral Valve: The mitral valve is normal in structure. No evidence of mitral valve regurgitation. No evidence of mitral valve stenosis. Tricuspid Valve: The tricuspid valve is normal in structure. Tricuspid valve regurgitation is mild . No evidence of tricuspid stenosis. Aortic Valve: The aortic valve is normal in structure. Aortic valve regurgitation is not visualized. No aortic stenosis is present. Pulmonic Valve: The pulmonic valve was normal in structure. Pulmonic valve regurgitation is not visualized. No evidence of pulmonic stenosis. Aorta: The aortic root is normal in size and structure. Venous: The inferior vena cava is normal in size with greater than 50% respiratory variability, suggesting right atrial pressure of 3 mmHg. IAS/Shunts: No atrial level shunt detected by color flow Doppler.  LEFT VENTRICLE PLAX 2D LVIDd:         4.30 cm   Diastology LVIDs:         2.20 cm   LV e' medial:    7.94 cm/s LV PW:         0.90 cm   LV E/e' medial:  9.6 LV IVS:        0.90 cm   LV e' lateral:   9.57 cm/s LVOT diam:     1.70 cm   LV E/e' lateral: 8.0 LV SV:         52 LV SV Index:   29 LVOT Area:     2.27 cm  RIGHT VENTRICLE RV S prime:     18.00 cm/s TAPSE (M-mode): 2.3 cm LEFT ATRIUM             Index LA diam:         3.70 cm 2.03 cm/m LA  Vol [facility]):   38.5 ml 21.12 ml/m LA Vol (A4C):   29.7 ml 16.29 ml/m LA Biplane Vol: 34.2 ml 18.76 ml/m  AORTIC VALVE LVOT Vmax:   125.00 cm/s LVOT Vmean:  77.800 cm/s LVOT VTI:    0.230 m  AORTA Ao Root diam: 2.40 cm Ao Asc diam:  2.70 cm MITRAL VALVE               TRICUSPID VALVE MV Area (PHT): 4.15 cm    TR Peak grad:   47.3 mmHg MV Decel Time: 183 msec    TR Vmax:        344.00 cm/s MV E velocity: 76.30 cm/s MV A velocity: 72.40 cm/s  SHUNTS MV E/A ratio:  1.05        Systemic VTI:  0.23 m                            Systemic Diam: 1.70 cm Jenkins Rouge MD Electronically signed by Jenkins Rouge MD Signature Date/Time: 04/12/2022/2:18:28 PM    Final     Cardiac Studies   N/A  Impression   Principal Problem:   Gynecologic malignancy [facility]) Active Problems:   Dyslipidemia associated with type 2 diabetes mellitus ([place])   Essential hypertension   Iron deficiency anemia   Type 2 diabetes mellitus with neurological complications ([facility])   Elevated TSH   Endometrial cancer ([place])   New onset atrial fibrillation ([facility])   Acute on chronic blood loss anemia   Recommendation   Ms. Liston had atrial fibrillation postoperatively and was placed on diltiazem and spontaneously converted to sinus rhythm.  She is maintaining sinus rhythm and feels well.  Echo was reassuring with normal systolic function and normal biatrial size.  I would recommend converting her short acting diltiazem to once daily Cardizem CD 120 mg and for her to stay on that.  She is not currently anticoagulated due to recent acute on chronic anemia in the requirement for transfusion.  I would advise a 30-day monitor after discharge to see if she has any recurrent atrial fibrillation or if it was just procedurally related, recommend holding off on anticoagulation for the time being.  We will follow-up tomorrow to see if she is maintaining rhythm.  Plan otherwise follow-up with me or APP in the office after  discharge.  Thanks for the consultation.  Time Spent Directly with Patient:  I have spent a total of 45 minutes with the patient reviewing hospital notes, telemetry, EKGs, labs and examining the patient as well as establishing an assessment and plan that was discussed personally with the patient.  > 50% of time was spent in direct patient care.  Length of Stay:  LOS: 4 days   Pixie Casino, MD, [facility] [place] Director of the Advanced Lipid Disorders &  Cardiovascular Risk Reduction Clinic Diplomate of the American Board of Clinical Lipidology Attending Cardiologist  Direct Dial: [phone]  Fax: [phone]  Website:  [email or web address] 04/13/2022, 9:19 AM

## 2022-04-13 NOTE — Evaluation (Signed)
Physical Therapy Evaluation Patient Details Name: Sherri Gill MRN: [id number] DOB: 02-24-1951 Today's Date: 04/13/2022  History of Present Illness  71 y.o. female with a past medical history of hypertension, type II DM, class I obesity, iron deficiency anemia, GERD, depression, and endometrial cancer who was admitted 04/09/22 for hysterectomy with bilateral salpingo-oophorectomy with omentectomy and debulking. Pt developed new onset of atrial fibrillation with a heart rate in the 130s  Clinical Impression  Pt admitted with above diagnosis.  Pt currently with functional limitations due to the deficits listed below (see PT Problem List). Pt will benefit from skilled PT to increase their independence and safety with mobility to allow discharge to the venue listed below.  Pt educated on log roll technique for pain control.  Pt able to ambulate in hallway and has guarded mobility however denies symptoms.  Pt reports receiving HHPT 1x/week prior to this surgery, and she feels she will need more therapy upon return home.  Pt would also like RW for home for safety during recovery.      Recommendations for follow up therapy are one component of a multi-disciplinary discharge planning process, led by the attending physician.  Recommendations may be updated based on patient status, additional functional criteria and insurance authorization.  Follow Up Recommendations Home health PT    Assistance Recommended at Discharge PRN  Patient can return home with the following  Help with stairs or ramp for entrance;Assistance with cooking/housework;Direct supervision/assist for financial management    Equipment Recommendations Rolling walker (2 wheels)  Recommendations for Other Services       Functional Status Assessment Patient has had a recent decline in their functional status and demonstrates the ability to make significant improvements in function in a reasonable and predictable amount of time.      Precautions / Restrictions Precautions Precautions: Fall Other Brace: abdominal binder in place      Mobility  Bed Mobility Overal bed mobility: Needs Assistance Bed Mobility: Rolling, Sidelying to Sit, Sit to Sidelying Rolling: Min guard Sidelying to sit: Min guard, HOB elevated     Sit to sidelying: HOB elevated, Mod assist General bed mobility comments: extra time and effort, guarded movements, utilized bed rails; cues for log roll technique; assist for LEs onto bed    Transfers Overall transfer level: Needs assistance Equipment used: None Transfers: Sit to/from Stand Sit to Stand: Min guard           General transfer comment: increased time and effort, guarded movements    Ambulation/Gait Ambulation/Gait assistance: Min guard Gait Distance (Feet): 200 Feet Assistive device: None Gait Pattern/deviations: Step-through pattern, Decreased stride length, Trunk flexed Gait velocity: decr     General Gait Details: cues for posture as tolerated, keeps UEs wrapped around abdoment, antalgic appearing gait however no unsteadiness or LOB observed; pt would prefer to use RW for safety next time  Stairs            Wheelchair Mobility    Modified Rankin (Stroke Patients Only)       Balance                                             Pertinent Vitals/Pain Pain Assessment Pain Assessment: 0-10 Pain Score: 5  Pain Location: surgical site Pain Descriptors / Indicators: Grimacing, Guarding, Operative site guarding Pain Intervention(s): Monitored during session, Repositioned    Home  Living Family/patient expects to be discharged to:: Private residence Living Arrangements: Children Available Help at Discharge: Available 24 hours/day Type of Home: Apartment Home Access: Stairs to enter Entrance Stairs-Rails: Right Entrance Stairs-Number of Steps: 3   Home Layout: One level Home Equipment: None      Prior Function Prior Level of  Function : Independent/Modified Independent             Mobility Comments: pt reports independence, does all  household tasks ADLs Comments: working at [employer]: Right    Extremity/Trunk Assessment        Lower Extremity Assessment Lower Extremity Assessment: Overall WFL for tasks assessed    Cervical / Trunk Assessment Cervical / Trunk Assessment: Normal  Communication   Communication: No difficulties  Cognition Arousal/Alertness: Awake/alert Behavior During Therapy: WFL for tasks assessed/performed Overall Cognitive Status: Within Functional Limits for tasks assessed                                          General Comments      Exercises     Assessment/Plan    PT Assessment Patient needs continued PT services  PT Problem List Decreased strength;Decreased activity tolerance;Decreased mobility;Decreased knowledge of use of DME       PT Treatment Interventions Gait training;DME instruction;Therapeutic exercise;Functional mobility training;Therapeutic activities;Patient/family education;Balance training    PT Goals (Current goals can be found in the Care Plan section)  Acute Rehab PT Goals PT Goal Formulation: With patient Time For Goal Achievement: 04/27/22 Potential to Achieve Goals: Good    Frequency Min 3X/week     Co-evaluation               AM-PAC PT "6 Clicks" Mobility  Outcome Measure Help needed turning from your back to your side while in a flat bed without using bedrails?: A Little Help needed moving from lying on your back to sitting on the side of a flat bed without using bedrails?: A Little Help needed moving to and from a bed to a chair (including a wheelchair)?: A Little Help needed standing up from a chair using your arms (e.g., wheelchair or bedside chair)?: A Little Help needed to walk in hospital room?: A Little Help needed climbing 3-5 steps with a railing? : A Little 6  Click Score: 18    End of Session Equipment Utilized During Treatment: Gait belt Activity Tolerance: Patient tolerated treatment well Patient left: in bed;with call bell/phone within reach Nurse Communication: Mobility status PT Visit Diagnosis: Difficulty in walking, not elsewhere classified (R26.2)    Time: 1046-1100 PT Time Calculation (min) (ACUTE ONLY): 14 min   Charges:   PT Evaluation $PT Eval Low Complexity: 1 Low         Kati PT, DPT Acute Rehabilitation Services Pager: [phone] Office: [phone]   Myrtis Hopping [place] 04/13/2022, 1:42 PM

## 2022-04-14 ENCOUNTER — Telehealth: Payer: Self-pay | Admitting: Physician Assistant

## 2022-04-14 DIAGNOSIS — D62 Acute posthemorrhagic anemia: Secondary | ICD-10-CM | POA: Diagnosis not present

## 2022-04-14 DIAGNOSIS — D508 Other iron deficiency anemias: Secondary | ICD-10-CM

## 2022-04-14 DIAGNOSIS — C579 Malignant neoplasm of female genital organ, unspecified: Secondary | ICD-10-CM | POA: Diagnosis not present

## 2022-04-14 DIAGNOSIS — I4891 Unspecified atrial fibrillation: Secondary | ICD-10-CM | POA: Diagnosis not present

## 2022-04-14 LAB — PHOSPHORUS: Phosphorus: 3.2 mg/dL (ref 2.5–4.6)

## 2022-04-14 LAB — COMPREHENSIVE METABOLIC PANEL
ALT: 14 U/L (ref 0–44)
AST: 13 U/L — ABNORMAL LOW (ref 15–41)
Albumin: 2.8 g/dL — ABNORMAL LOW (ref 3.5–5.0)
Alkaline Phosphatase: 190 U/L — ABNORMAL HIGH (ref 38–126)
Anion gap: 8 (ref 5–15)
BUN: 11 mg/dL (ref 8–23)
CO2: 25 mmol/L (ref 22–32)
Calcium: 9 mg/dL (ref 8.9–10.3)
Chloride: 106 mmol/L (ref 98–111)
Creatinine, Ser: 0.85 mg/dL (ref 0.44–1.00)
GFR, Estimated: >60 mL/min (ref >60)
Glucose, Bld: 96 mg/dL (ref 70–99)
Potassium: 3.5 mmol/L (ref 3.5–5.1)
Sodium: 139 mmol/L (ref 135–145)
Total Bilirubin: 0.3 mg/dL (ref 0.3–1.2)
Total Protein: 6.2 g/dL — ABNORMAL LOW (ref 6.5–8.1)

## 2022-04-14 LAB — CBC WITH DIFFERENTIAL/PLATELET
Abs Immature Granulocytes: 0.61 10*3/uL — ABNORMAL HIGH (ref 0.00–0.07)
Basophils Absolute: 0.1 10*3/uL (ref 0.0–0.1)
Basophils Relative: 1 %
Eosinophils Absolute: 0.2 10*3/uL (ref 0.0–0.5)
Eosinophils Relative: 1 %
HCT: 27.1 % — ABNORMAL LOW (ref 36.0–46.0)
Hemoglobin: 8.5 g/dL — ABNORMAL LOW (ref 12.0–15.0)
Immature Granulocytes: 5 %
Lymphocytes Relative: 15 %
Lymphs Abs: 1.9 10*3/uL (ref 0.7–4.0)
MCH: 27.5 pg (ref 26.0–34.0)
MCHC: 31.4 g/dL (ref 30.0–36.0)
MCV: 87.7 fL (ref 80.0–100.0)
Monocytes Absolute: 1.2 10*3/uL — ABNORMAL HIGH (ref 0.1–1.0)
Monocytes Relative: 9 %
Neutro Abs: 9 10*3/uL — ABNORMAL HIGH (ref 1.7–7.7)
Neutrophils Relative %: 69 %
Platelets: 353 10*3/uL (ref 150–400)
RBC: 3.09 MIL/uL — ABNORMAL LOW (ref 3.87–5.11)
RDW: 22.4 % — ABNORMAL HIGH (ref 11.5–15.5)
WBC: 12.9 10*3/uL — ABNORMAL HIGH (ref 4.0–10.5)
nRBC: 0.2 % (ref 0.0–0.2)

## 2022-04-14 LAB — GLUCOSE, CAPILLARY
Glucose-Capillary: 113 mg/dL — ABNORMAL HIGH (ref 70–99)
Glucose-Capillary: 98 mg/dL (ref 70–99)
Glucose-Capillary: 105 mg/dL — ABNORMAL HIGH (ref 70–99)
Glucose-Capillary: 109 mg/dL — ABNORMAL HIGH (ref 70–99)

## 2022-04-14 LAB — MAGNESIUM: Magnesium: 1.6 mg/dL — ABNORMAL LOW (ref 1.7–2.4)

## 2022-04-14 MED ORDER — POTASSIUM CHLORIDE CRYS ER 20 MEQ PO TBCR
40.0000 meq | EXTENDED_RELEASE_TABLET | Freq: Once | ORAL | Status: AC
  Administered 2022-04-14: 40 meq via ORAL
  Filled 2022-04-14: qty 2

## 2022-04-14 MED ORDER — MAGNESIUM SULFATE 2 GM/50ML IV SOLN
2.0000 g | Freq: Once | INTRAVENOUS | Status: AC
  Administered 2022-04-14: 2 g via INTRAVENOUS
  Filled 2022-04-14: qty 50

## 2022-04-14 NOTE — Progress Notes (Signed)
DAILY PROGRESS NOTE   Patient Name: Sherri Gill Date of Encounter: 04/14/2022 Cardiologist: None  Chief Complaint   none  Patient Profile   71 yo female with gynecologic malignancy, present with new onset afib with RVR  Subjective   No further afib overnight - now on cardizem CD 120 mg daily  Objective   Vitals:   04/13/22 1043 04/13/22 1447 04/13/22 2037 04/14/22 0601  BP: 128/69 (!) 154/65 (!) 156/62 (!) 144/66  Pulse: 87 90 94 83  Resp: '17 17 18 18  '$ Temp: 97.9 F (36.6 C) (!) 97.2 F (36.2 C) 98.7 F (37.1 C) 97.8 F (36.6 C)  TempSrc: Oral Oral Oral Oral  SpO2: 96% 95% 94% 98%  Weight:      Height:        Intake/Output Summary (Last 24 hours) at 04/14/2022 0945 Last data filed at 04/14/2022 0900 Gross per 24 hour  Intake 1675.14 ml  Output 1350 ml  Net 325.14 ml   Filed Weights   04/12/22 0310 04/12/22 2139  Weight: 79 kg 85.2 kg    Physical Exam   General appearance: alert and no distress Lungs: clear to auscultation bilaterally Heart: regular rate and rhythm, S1, S2 normal, no murmur, click, rub or gallop Pulses: 2+ and symmetric Neurologic: Grossly normal  Inpatient Medications    Scheduled Meds:  atorvastatin  20 mg Oral QHS   chewing gum (ORBIT) sugar free  1 Stick Oral TID WC   Chlorhexidine Gluconate Cloth  6 each Topical Daily   diltiazem  120 mg Oral Daily   dorzolamide-timolol  1 drop Both Eyes BID   enoxaparin (LOVENOX) injection  40 mg Subcutaneous Q24H   feeding supplement (GLUCERNA SHAKE)  237 mL Oral TID BM   ibuprofen  600 mg Oral Q6H   insulin aspart  0-15 Units Subcutaneous TID WC   insulin aspart  0-5 Units Subcutaneous QHS   latanoprost  1 drop Both Eyes QHS   liraglutide  1.2 mg Subcutaneous Daily   metFORMIN  1,000 mg Oral BID WC   pantoprazole  40 mg Oral Daily   polyethylene glycol  17 g Oral Daily   potassium chloride  40 mEq Oral Once   senna-docusate  2 tablet Oral QHS   sodium chloride flush  10-40 mL  Intracatheter Q12H    Continuous Infusions:  magnesium sulfate bolus IVPB      PRN Meds: acetaminophen, HYDROmorphone (DILAUDID) injection, HYDROmorphone, levalbuterol, metoprolol tartrate, ondansetron **OR** ondansetron (ZOFRAN) IV, mouth rinse, sodium chloride flush, traMADol   Labs   Results for orders placed or performed during the hospital encounter of 04/09/22 (from the past 48 hour(s))  Glucose, capillary     Status: Abnormal   Collection Time: 04/12/22 11:37 AM  Result Value Ref Range   Glucose-Capillary 125 (H) 70 - 99 mg/dL    Comment: Glucose reference range applies only to samples taken after fasting for at least 8 hours.  Glucose, capillary     Status: Abnormal   Collection Time: 04/12/22  2:18 PM  Result Value Ref Range   Glucose-Capillary 135 (H) 70 - 99 mg/dL    Comment: Glucose reference range applies only to samples taken after fasting for at least 8 hours.  MRSA Next Gen by PCR, Nasal     Status: None   Collection Time: 04/12/22  2:20 PM   Specimen: Nasal Mucosa; Nasal Swab  Result Value Ref Range   MRSA by PCR Next Gen NOT DETECTED NOT DETECTED  Comment: (NOTE) The GeneXpert MRSA Assay (FDA approved for NASAL specimens only), is one component of a comprehensive MRSA colonization surveillance program. It is not intended to diagnose MRSA infection nor to guide or monitor treatment for MRSA infections. Test performance is not FDA approved in patients less than 58 years old. Performed at [facility], [place] [address], [place], [place] [address]   Magnesium     Status: None   Collection Time: 04/12/22  3:04 PM  Result Value Ref Range   Magnesium 1.7 1.7 - 2.4 mg/dL    Comment: Performed at [facility], [place] [address] [place], [place] [address]  Brain natriuretic peptide     Status: None   Collection Time: 04/12/22  3:04 PM  Result Value Ref Range   B Natriuretic Peptide 98.9 0.0 - 100.0 pg/mL    Comment: Performed  at [facility], [place] [address], [place], [place] [address]  TSH     Status: None   Collection Time: 04/12/22  3:04 PM  Result Value Ref Range   TSH 3.817 0.350 - 4.500 uIU/mL    Comment: Performed by a 3rd Generation assay with a functional sensitivity of <=0.01 uIU/mL. Performed at [facility], [place] [address], [place], [place] [address]   Troponin I (High Sensitivity)     Status: None   Collection Time: 04/12/22  3:04 PM  Result Value Ref Range   Troponin I (High Sensitivity) 4 <18 ng/L    Comment: (NOTE) Elevated high sensitivity troponin I (hsTnI) values and significant  changes across serial measurements may suggest ACS but many other  chronic and acute conditions are known to elevate hsTnI results.  Refer to the "Links" section for chest pain algorithms and additional  guidance. Performed at [facility], [place] [address], [place], [place] [address]   Glucose, capillary     Status: Abnormal   Collection Time: 04/12/22  4:44 PM  Result Value Ref Range   Glucose-Capillary 129 (H) 70 - 99 mg/dL    Comment: Glucose reference range applies only to samples taken after fasting for at least 8 hours.  Troponin I (High Sensitivity)     Status: None   Collection Time: 04/12/22  6:15 PM  Result Value Ref Range   Troponin I (High Sensitivity) 5 <18 ng/L    Comment: (NOTE) Elevated high sensitivity troponin I (hsTnI) values and significant  changes across serial measurements may suggest ACS but many other  chronic and acute conditions are known to elevate hsTnI results.  Refer to the "Links" section for chest pain algorithms and additional  guidance. Performed at [facility], [place] [address] [place], [place] [address]   Hemoglobin and hematocrit, blood     Status: Abnormal   Collection Time: 04/12/22  6:15 PM  Result Value Ref Range   Hemoglobin 9.2 (L) 12.0 - 15.0 g/dL   HCT 29.2 (L) 36.0 - 46.0 %     Comment: Performed at [facility], [place] [address] [place], [place] [address]  Glucose, capillary     Status: Abnormal   Collection Time: 04/12/22  9:15 PM  Result Value Ref Range   Glucose-Capillary 103 (H) 70 - 99 mg/dL    Comment: Glucose reference range applies only to samples taken after fasting for at least 8 hours.  CBC     Status: Abnormal   Collection Time: 04/13/22  6:04 AM  Result Value Ref Range  WBC 15.0 (H) 4.0 - 10.5 K/uL   RBC 3.10 (L) 3.87 - 5.11 MIL/uL   Hemoglobin 8.6 (L) 12.0 - 15.0 g/dL   HCT 27.2 (L) 36.0 - 46.0 %   MCV 87.7 80.0 - 100.0 fL   MCH 27.7 26.0 - 34.0 pg   MCHC 31.6 30.0 - 36.0 g/dL   RDW 22.9 (H) 11.5 - 15.5 %   Platelets 319 150 - 400 K/uL   nRBC 0.0 0.0 - 0.2 %    Comment: Performed at [facility], [place] [address], [place], [place] [address]  Basic metabolic panel     Status: Abnormal   Collection Time: 04/13/22  6:04 AM  Result Value Ref Range   Sodium 138 135 - 145 mmol/L   Potassium 4.1 3.5 - 5.1 mmol/L   Chloride 109 98 - 111 mmol/L   CO2 24 22 - 32 mmol/L   Glucose, Bld 109 (H) 70 - 99 mg/dL    Comment: Glucose reference range applies only to samples taken after fasting for at least 8 hours.   BUN 15 8 - 23 mg/dL   Creatinine, Ser 1.06 (H) 0.44 - 1.00 mg/dL   Calcium 9.0 8.9 - 10.3 mg/dL   GFR, Estimated 57 (L) >60 mL/min    Comment: (NOTE) Calculated using the CKD-EPI Creatinine Equation (2021)    Anion gap 5 5 - 15    Comment: Performed at [facility], [place] [address], [place], [place] [address]  Glucose, capillary     Status: Abnormal   Collection Time: 04/13/22  8:11 AM  Result Value Ref Range   Glucose-Capillary 100 (H) 70 - 99 mg/dL    Comment: Glucose reference range applies only to samples taken after fasting for at least 8 hours.  Urinalysis, Routine w reflex microscopic Urine, Clean Catch     Status: Abnormal   Collection Time: 04/13/22 11:07 AM  Result Value Ref  Range   Color, Urine YELLOW YELLOW   APPearance CLEAR CLEAR   Specific Gravity, Urine 1.011 1.005 - 1.030   pH 5.0 5.0 - 8.0   Glucose, UA NEGATIVE NEGATIVE mg/dL   Hgb urine dipstick MODERATE (A) NEGATIVE   Bilirubin Urine NEGATIVE NEGATIVE   Ketones, ur NEGATIVE NEGATIVE mg/dL   Protein, ur NEGATIVE NEGATIVE mg/dL   Nitrite NEGATIVE NEGATIVE   Leukocytes,Ua NEGATIVE NEGATIVE   RBC / HPF 6-10 0 - 5 RBC/hpf   WBC, UA 0-5 0 - 5 WBC/hpf   Bacteria, UA RARE (A) NONE SEEN   Squamous Epithelial / LPF 0-5 0 - 5   Mucus PRESENT     Comment: Performed at [facility], [place] [address], [place], [place] [address]  Glucose, capillary     Status: Abnormal   Collection Time: 04/13/22 12:06 PM  Result Value Ref Range   Glucose-Capillary 106 (H) 70 - 99 mg/dL    Comment: Glucose reference range applies only to samples taken after fasting for at least 8 hours.  Culture, blood (Routine X 2) w Reflex to ID Panel     Status: None (Preliminary result)   Collection Time: 04/13/22  2:36 PM   Specimen: Right Antecubital; Blood  Result Value Ref Range   Specimen Description      RIGHT ANTECUBITAL Performed at [facility], [place] [address], [place], [place] [address]    Special Requests      BOTTLES DRAWN AEROBIC ONLY Blood Culture adequate volume Performed at [facility],  [place] [address] [place], [place] [address]    Culture      NO GROWTH < 24 HOURS Performed at [place] [address] [place], [place] [address]    Report Status PENDING   Glucose, capillary     Status: Abnormal   Collection Time: 04/13/22  4:34 PM  Result Value Ref Range   Glucose-Capillary 110 (H) 70 - 99 mg/dL    Comment: Glucose reference range applies only to samples taken after fasting for at least 8 hours.  Culture, blood (Routine X 2) w Reflex to ID Panel     Status: None (Preliminary result)   Collection Time: 04/13/22  4:53 PM   Specimen: BLOOD   Result Value Ref Range   Specimen Description      BLOOD RIGHT ANTECUBITAL Performed at [facility], [place] [address] [place], [place] [address]    Special Requests      BOTTLES DRAWN AEROBIC AND ANAEROBIC Blood Culture adequate volume Performed at [place] [address], [place], [place] [address]    Culture      NO GROWTH < 12 HOURS Performed at [place] [address], [place], [place] [address]    Report Status PENDING   Glucose, capillary     Status: Abnormal   Collection Time: 04/13/22 10:04 PM  Result Value Ref Range   Glucose-Capillary 121 (H) 70 - 99 mg/dL    Comment: Glucose reference range applies only to samples taken after fasting for at least 8 hours.  CBC with Differential/Platelet     Status: Abnormal   Collection Time: 04/14/22  6:03 AM  Result Value Ref Range   WBC 12.9 (H) 4.0 - 10.5 K/uL   RBC 3.09 (L) 3.87 - 5.11 MIL/uL   Hemoglobin 8.5 (L) 12.0 - 15.0 g/dL   HCT 27.1 (L) 36.0 - 46.0 %   MCV 87.7 80.0 - 100.0 fL   MCH 27.5 26.0 - 34.0 pg   MCHC 31.4 30.0 - 36.0 g/dL   RDW 22.4 (H) 11.5 - 15.5 %   Platelets 353 150 - 400 K/uL   nRBC 0.2 0.0 - 0.2 %   Neutrophils Relative % 69 %   Neutro Abs 9.0 (H) 1.7 - 7.7 K/uL   Lymphocytes Relative 15 %   Lymphs Abs 1.9 0.7 - 4.0 K/uL   Monocytes Relative 9 %   Monocytes Absolute 1.2 (H) 0.1 - 1.0 K/uL   Eosinophils Relative 1 %   Eosinophils Absolute 0.2 0.0 - 0.5 K/uL   Basophils Relative 1 %   Basophils Absolute 0.1 0.0 - 0.1 K/uL   Immature Granulocytes 5 %   Abs Immature Granulocytes 0.61 (H) 0.00 - 0.07 K/uL   Tear Drop Cells PRESENT    Polychromasia PRESENT     Comment: Performed at [facility], [place] [address], [place], [place] [address]  Comprehensive metabolic panel     Status: Abnormal   Collection Time: 04/14/22  6:03 AM  Result Value Ref Range   Sodium 139 135 - 145 mmol/L   Potassium 3.5 3.5 - 5.1 mmol/L   Chloride 106  98 - 111 mmol/L   CO2 25 22 - 32 mmol/L   Glucose, Bld 96 70 - 99 mg/dL    Comment: Glucose reference range applies only to samples taken after fasting for at least 8 hours.   BUN 11 8 - 23 mg/dL   Creatinine, Ser 0.85 0.44 - 1.00  mg/dL   Calcium 9.0 8.9 - 10.3 mg/dL   Total Protein 6.2 (L) 6.5 - 8.1 g/dL   Albumin 2.8 (L) 3.5 - 5.0 g/dL   AST 13 (L) 15 - 41 U/L   ALT 14 0 - 44 U/L   Alkaline Phosphatase 190 (H) 38 - 126 U/L   Total Bilirubin 0.3 0.3 - 1.2 mg/dL   GFR, Estimated >60 >60 mL/min    Comment: (NOTE) Calculated using the CKD-EPI Creatinine Equation (2021)    Anion gap 8 5 - 15    Comment: Performed at [facility], [place] [address], [place], [place] [address]  Phosphorus     Status: None   Collection Time: 04/14/22  6:03 AM  Result Value Ref Range   Phosphorus 3.2 2.5 - 4.6 mg/dL    Comment: Performed at [facility], [place] [address], [place], [place] [address]  Magnesium     Status: Abnormal   Collection Time: 04/14/22  6:03 AM  Result Value Ref Range   Magnesium 1.6 (L) 1.7 - 2.4 mg/dL    Comment: Performed at [facility], [place] [address], [place], [place] [address]  Glucose, capillary     Status: Abnormal   Collection Time: 04/14/22  8:01 AM  Result Value Ref Range   Glucose-Capillary 113 (H) 70 - 99 mg/dL    Comment: Glucose reference range applies only to samples taken after fasting for at least 8 hours.    ECG   N/A  Telemetry   Sinus rhythm - Personally Reviewed  Radiology    DG Chest 2 View  Result Date: 04/13/2022 CLINICAL DATA:  Leukocytosis.  Recent abdominal surgery. EXAM: CHEST - 2 VIEW COMPARISON:  12/30/2021 and prior studies FINDINGS: Cardiomediastinal silhouette is unchanged. A RIGHT Port-A-Cath is present with tip overlying the UPPER RIGHT atrium. This is a low volume study with peribronchial thickening bilaterally noted. No definite airspace disease, pleural effusion or pneumothorax  noted. No significant abnormalities identified in the visualized UPPER abdomen. There is no evidence of gross pneumoperitoneum. IMPRESSION: Low volume study with peribronchial thickening bilaterally. No definite airspace disease. Electronically Signed   By: Margarette Canada M.D.   On: 04/13/2022 12:37   ECHOCARDIOGRAM COMPLETE  Result Date: 04/12/2022    ECHOCARDIOGRAM REPORT   Patient Name:   Sherri Gill Date of Exam: 04/12/2022 Medical Rec #:  [id number]        Height:       63.0 in Accession #:    [id number]       Weight:       174.2 lb Date of Birth:  09-13-1951         BSA:          1.823 m Patient Age:    89 years         BP:           132/71 mmHg Patient Gender: F                HR:           128 bpm. Exam Location:  Inpatient Procedure: 2D Echo, Cardiac Doppler and Color Doppler Indications:    Atrial Fibrillation I48.91  History:        Patient has no prior history of Echocardiogram examinations.                 Risk Factors:Hypertension and Diabetes. Anemia. GERD. New onset  of Afib post surgery.  Sonographer:    Darlina Sicilian RDCS Referring Phys: [id number] DAVID MANUEL Koppel  1. Left ventricular ejection fraction, by estimation, is 60 to 65%. The left ventricle has normal function. The left ventricle has no regional wall motion abnormalities. Left ventricular diastolic parameters are indeterminate.  2. Right ventricular systolic function is normal. The right ventricular size is normal. There is moderately elevated pulmonary artery systolic pressure.  3. The pericardial effusion is posterior to the left ventricle.  4. The mitral valve is normal in structure. No evidence of mitral valve regurgitation. No evidence of mitral stenosis.  5. The aortic valve is normal in structure. Aortic valve regurgitation is not visualized. No aortic stenosis is present.  6. The inferior vena cava is normal in size with greater than 50% respiratory variability, suggesting right atrial pressure of 3  mmHg. FINDINGS  Left Ventricle: Left ventricular ejection fraction, by estimation, is 60 to 65%. The left ventricle has normal function. The left ventricle has no regional wall motion abnormalities. The left ventricular internal cavity size was normal in size. There is  no left ventricular hypertrophy. Left ventricular diastolic parameters are indeterminate. Right Ventricle: The right ventricular size is normal. No increase in right ventricular wall thickness. Right ventricular systolic function is normal. There is moderately elevated pulmonary artery systolic pressure. The tricuspid regurgitant velocity is 3.44 m/s, and with an assumed right atrial pressure of 3 mmHg, the estimated right ventricular systolic pressure is 09.7 mmHg. Left Atrium: Left atrial size was normal in size. Right Atrium: Right atrial size was normal in size. Pericardium: Trivial pericardial effusion is present. The pericardial effusion is posterior to the left ventricle. Mitral Valve: The mitral valve is normal in structure. No evidence of mitral valve regurgitation. No evidence of mitral valve stenosis. Tricuspid Valve: The tricuspid valve is normal in structure. Tricuspid valve regurgitation is mild . No evidence of tricuspid stenosis. Aortic Valve: The aortic valve is normal in structure. Aortic valve regurgitation is not visualized. No aortic stenosis is present. Pulmonic Valve: The pulmonic valve was normal in structure. Pulmonic valve regurgitation is not visualized. No evidence of pulmonic stenosis. Aorta: The aortic root is normal in size and structure. Venous: The inferior vena cava is normal in size with greater than 50% respiratory variability, suggesting right atrial pressure of 3 mmHg. IAS/Shunts: No atrial level shunt detected by color flow Doppler.  LEFT VENTRICLE PLAX 2D LVIDd:         4.30 cm   Diastology LVIDs:         2.20 cm   LV e' medial:    7.94 cm/s LV PW:         0.90 cm   LV E/e' medial:  9.6 LV IVS:        0.90 cm    LV e' lateral:   9.57 cm/s LVOT diam:     1.70 cm   LV E/e' lateral: 8.0 LV SV:         52 LV SV Index:   29 LVOT Area:     2.27 cm  RIGHT VENTRICLE RV S prime:     18.00 cm/s TAPSE (M-mode): 2.3 cm LEFT ATRIUM             Index LA diam:        3.70 cm 2.03 cm/m LA Vol (A2C):   38.5 ml 21.12 ml/m LA Vol (A4C):   29.7 ml 16.29 ml/m LA Biplane Vol: 34.2 ml 18.76 ml/m  AORTIC VALVE LVOT Vmax:   125.00 cm/s LVOT Vmean:  77.800 cm/s LVOT VTI:    0.230 m  AORTA Ao Root diam: 2.40 cm Ao Asc diam:  2.70 cm MITRAL VALVE               TRICUSPID VALVE MV Area (PHT): 4.15 cm    TR Peak grad:   47.3 mmHg MV Decel Time: 183 msec    TR Vmax:        344.00 cm/s MV E velocity: 76.30 cm/s MV A velocity: 72.40 cm/s  SHUNTS MV E/A ratio:  1.05        Systemic VTI:  0.23 m                            Systemic Diam: 1.70 cm Jenkins Rouge MD Electronically signed by Jenkins Rouge MD Signature Date/Time: 04/12/2022/2:18:28 PM    Final     Cardiac Studies   Echo above  Assessment   Principal Problem:   Gynecologic malignancy [facility]) Active Problems:   Dyslipidemia associated with type 2 diabetes mellitus ([place])   Essential hypertension   Iron deficiency anemia   Type 2 diabetes mellitus with neurological complications ([facility])   Elevated TSH   Endometrial cancer ([place])   New onset atrial fibrillation ([facility])   Acute on chronic blood loss anemia   Plan   Maintaining sinus rhythm overnight- would not recommend anticoagulation given recent surgery, transfusion and anemia and short duration of afib. Continue long-acting diltiazem. Will arrange for 30 day outpatient monitor after discharged.  Ok to d/c home today from a cardiology standpoint. Follow-up with me or APP in 6-8 weeks after she completes 1 month of monitoring.  Time Spent Directly with Patient:  I have spent a total of 25 minutes with the patient reviewing hospital notes, telemetry, EKGs, labs and examining the patient as well as establishing an assessment and  plan that was discussed personally with the patient.  > 50% of time was spent in direct patient care.  Length of Stay:  LOS: 5 days   Pixie Casino, MD, [facility], [place] Director of the Advanced Lipid Disorders &  Cardiovascular Risk Reduction Clinic Diplomate of the American Board of Clinical Lipidology Attending Cardiologist  Direct Dial: [phone]  Fax: [phone]  Website:  [email or web address] Nay 04/14/2022, 9:45 AM

## 2022-04-14 NOTE — Telephone Encounter (Signed)
Dr. Debara Pickett reached out to request 30 day monitor for this pt for afib as well as 6-8 week f/u appt with him or APP. Sent msg to monitor team and scheduling to help arrange. Monitor will be mailed to pt's house. I notified patient to be aware this is coming and also confirmed address and phone numbers are correct as they are in EMR.

## 2022-04-14 NOTE — Progress Notes (Signed)
30 day monitor will be mailed to pt's house. Sent msg to monitor team and schedulers to help arrange. Patient was made aware by phone that monitor will be mailed and office will be calling her with appt info.

## 2022-04-14 NOTE — Progress Notes (Signed)
PROGRESS NOTE    Sherri Gill  ZJQ:[id number] DOB: 09/23/1951 DOA: 04/09/2022 PCP: [name], MD   Brief Narrative:  The patient is a obese 71 year old African-American female with a past medical history significant for but not limited to hypertension, diabetes mellitus type 2, iron deficiency anemia, GERD, depression and anxiety as well as endometrial cancer who just underwent hysterectomy and bilateral salpingo-oophorectomy with omentectomy and debulking who developed new onset atrial fibrillation with heart rates in the 130s.  She denied any alcohol tobacco or drug use and she states that she drank coffee in the morning.  Postoperatively she had this A-fib with RVR and states that her heart was racing.  TRH was asked to evaluate for his new onset atrial fibrillation and she was given Cardizem which helped her with rate control.  Cardiology was also consulted they reviewed the telemetry and she has converted back to sinus rhythm and maintained this overnight.  Echocardiogram was done and showed a normal systolic function and cardiology recommends converting her short acting diltiazem to once a day Cardizem CD 124 her to stay on that.  Currently she not anticoagulated due to her recent acute on chronic anemia with requirement for transfusion but cardiology recommending a 30-day monitor after discharge if she has any recurrent A-fib or if it is procedure related and recommending holding off anticoagulation at this time.  WBC is improving along with her renal function.  Mag was a little low so we will replete.  UA negative and chest x-ray negative for any evidence of infection.  Blood count remained stable and patient is likely being discharged tomorrow per Primary team.   Assessment and Plan:  Gynecologic malignancy [facility]) Endometrial cancer ([place]) -Continue postop care per primary team and primary management per them   New onset atrial fibrillation ([place]) -CHA2DS2-VASc Score of 5 out of  9  (Age, DM, female, aortic atherosclerosis, HTN) -It is a considerable risk for embolic CVA.  -Rate control with Cardizem 10 mg IVP x1. -Transferred to PCU in case infusion needed. -No anticoagulation for now given her recent acute on chronic anemia. -Begin diltiazem 30 mg p.o. every 6 hours and cardiology is consolidated to Cardizem CD 120 mg p.o. daily. -Beta-Blocker with Metoprolol Tartrate 5 mg IV every 6 as needed for sustained heart rate greater than 105 -Keep electrolytes optimized. -ECHOCardiogram was obtained and showed EF of 60-65% with no wall motion abnormalities with LV Diastolic Parameters are indeterminate   -Cardiology recommending Continuing Long-Acting Diltiazem and they are recommending arranging 30 day outpatient monitor and D/Cing from a Cardiac Perspective and following up within 6-8 weeks after she completes 1 month of monitoring    Elevated TSH history -Checked TSH level and was 3.817   Essential Hypertension -Discontinue amlodipine to avoid reflex tachycardia. -Begin diltiazem as above and now consolidate to Cardizem CD 120 mg Daily. -Continue to Monitor blood pressure and heart rate. -Last BP reading was 144/66   Type 2 diabetes mellitus with neurological complications (HCC) -Carbohydrate modified diet. -CBG monitoring before meals and bedtime. -Continue metformin 1000 mg p.o. daily. -Analgesics as needed for neuropathy. -Monitor CBGs per protocol; CBGs ranging from 98-121   AKI -Patient's BUN/Cr went from 14/0.71 -> 13/0.80 -> 12/0.90 -> 15/1.06 -> 11/0.85 -Will give NS 75 mL/hr x12 hours and now stopped  -Continue to Monitor function carefully and avoid nephrotoxic medications, contrast dyes, hypotension and dehydration and renally adjust medications -Recommend Judicious use of NSAIDS -Repeat CMP in a.m.   Leukocytosis -Likely  reactive but is trending up and WBC went from 9.4 -> 11.3 -> 12.7 -> 12.5 -> 15.0 -> 12.9 -Checking blood cultures x2 and the  primary team is ordered urinalysis and Chest x-ray -Getting an incentive spirometer -MRSA PCR was negative -Continue to monitor for signs and symptoms of infection -Urinalysis was done and showed clear appearance with negative bilirubin, yellow color urine, moderate hemoglobin, negative ketones, negative nitrites, negative leukocytes, rare bacteria, 6-10 RBCs per high-power field and 0-5 WBCs -Chest x-ray showed "Low volume study with peribronchial thickening bilaterally. No definite airspace disease." -Follow-up on blood cultures and continue to monitor and repeat CBC in AM -Blood Cx x2 showed NGTD at <12 Hours    Acute on Chronic Blood Loss Anemia Iron Deficiency Anemia -Received PRBC transfusion -Patient's hemoglobin/hematocrit went from 8.9/27.8 -> 7.7/24.8 -> 8.5/27.2 -> 7.4/24.2 -> 9.2/29.2 -> 8.6/27.2 -> 8.5/27.1 -Continue to monitor for signs and symptoms of bleeding; no overt bleeding noted -Repeat CMP in the AM    Dyslipidemia associated with type 2 diabetes mellitus (HCC) -Continue Atorvastatin 20 mg p.o. daily.  Hypomagnesemia -Mag Level is now 1.6 -Replete with IV Mag Sulfate 2 grams -Continue to Monitor and Replete as Necessary -Repeat Mag Level in the AM    Obesity -Complicates overall prognosis and care -Estimated body mass index is 33.27 kg/m as calculated from the following:   Height as of this encounter: '5\' 3"'$  (1.6 m).   Weight as of this encounter: 85.2 kg.  -Weight Loss and Dietary Counseling given  DVT prophylaxis: enoxaparin (LOVENOX) injection 40 mg Start: 04/10/22 1200 SCDs Start: 04/09/22 1445    Code Status: Full Code Family Communication: No family present at bedside   Disposition Plan:  Level of care: Progressive Status is: Inpatient Remains inpatient appropriate because: Primary team watching another day and D/Cing home in the AM    Consultants:  [facility] Card[facility] GYN-ONC (Primary)  Procedures:  Diagnostic laparoscopy, conversion to  exploratory laparotomy, partial omentectomy, lysis of adhesions for approximately 60 minutes, total abdominal hysterectomy, bilateral salpingo-oophorectomy, excision of ileal tumor implant and excision of transverse colon epiploica tumor implant  ECHOCARDIOGRAM IMPRESSIONS     1. Left ventricular ejection fraction, by estimation, is 60 to 65%. The  left ventricle has normal function. The left ventricle has no regional  wall motion abnormalities. Left ventricular diastolic parameters are  indeterminate.   2. Right ventricular systolic function is normal. The right ventricular  size is normal. There is moderately elevated pulmonary artery systolic  pressure.   3. The pericardial effusion is posterior to the left ventricle.   4. The mitral valve is normal in structure. No evidence of mitral valve  regurgitation. No evidence of mitral stenosis.   5. The aortic valve is normal in structure. Aortic valve regurgitation is  not visualized. No aortic stenosis is present.   6. The inferior vena cava is normal in size with greater than 50%  respiratory variability, suggesting right atrial pressure of 3 mmHg.   FINDINGS   Left Ventricle: Left ventricular ejection fraction, by estimation, is 60  to 65%. The left ventricle has normal function. The left ventricle has no  regional wall motion abnormalities. The left ventricular internal cavity  size was normal in size. There is   no left ventricular hypertrophy. Left ventricular diastolic parameters  are indeterminate.   Right Ventricle: The right ventricular size is normal. No increase in  right ventricular wall thickness. Right ventricular systolic function is  normal. There is moderately elevated pulmonary  artery systolic pressure.  The tricuspid regurgitant velocity is  3.44 m/s, and with an assumed right atrial pressure of 3 mmHg, the  estimated right ventricular systolic pressure is 44.0 mmHg.   Left Atrium: Left atrial size was normal in  size.   Right Atrium: Right atrial size was normal in size.   Pericardium: Trivial pericardial effusion is present. The pericardial  effusion is posterior to the left ventricle.   Mitral Valve: The mitral valve is normal in structure. No evidence of  mitral valve regurgitation. No evidence of mitral valve stenosis.   Tricuspid Valve: The tricuspid valve is normal in structure. Tricuspid  valve regurgitation is mild . No evidence of tricuspid stenosis.   Aortic Valve: The aortic valve is normal in structure. Aortic valve  regurgitation is not visualized. No aortic stenosis is present.   Pulmonic Valve: The pulmonic valve was normal in structure. Pulmonic valve  regurgitation is not visualized. No evidence of pulmonic stenosis.   Aorta: The aortic root is normal in size and structure.   Venous: The inferior vena cava is normal in size with greater than 50%  respiratory variability, suggesting right atrial pressure of 3 mmHg.   IAS/Shunts: No atrial level shunt detected by color flow Doppler.      LEFT VENTRICLE  PLAX 2D  LVIDd:         4.30 cm   Diastology  LVIDs:         2.20 cm   LV e' medial:    7.94 cm/s  LV PW:         0.90 cm   LV E/e' medial:  9.6  LV IVS:        0.90 cm   LV e' lateral:   9.57 cm/s  LVOT diam:     1.70 cm   LV E/e' lateral: 8.0  LV SV:         52  LV SV Index:   29  LVOT Area:     2.27 cm      RIGHT VENTRICLE  RV S prime:     18.00 cm/s  TAPSE (M-mode): 2.3 cm   LEFT ATRIUM             Index  LA diam:        3.70 cm 2.03 cm/m  LA Vol (A2C):   38.5 ml 21.12 ml/m  LA Vol (A4C):   29.7 ml 16.29 ml/m  LA Biplane Vol: 34.2 ml 18.76 ml/m   AORTIC VALVE  LVOT Vmax:   125.00 cm/s  LVOT Vmean:  77.800 cm/s  LVOT VTI:    0.230 m     AORTA  Ao Root diam: 2.40 cm  Ao Asc diam:  2.70 cm   MITRAL VALVE               TRICUSPID VALVE  MV Area (PHT): 4.15 cm    TR Peak grad:   47.3 mmHg  MV Decel Time: 183 msec    TR Vmax:        344.00 cm/s  MV  E velocity: 76.30 cm/s  MV A velocity: 72.40 cm/s  SHUNTS  MV E/A ratio:  1.05        Systemic VTI:  0.23 m                             Systemic Diam: 1.70 cm   Antimicrobials:  Anti-infectives (From admission, onward)  Start     Dose/Rate Route Frequency Ordered Stop   04/09/22 1149  ceFAZolin (ANCEF) 2-4 GM/100ML-% IVPB       Note to Pharmacy: Jasmine Pang K: cabinet override      04/09/22 1149 04/09/22 2359   04/09/22 0600  ceFAZolin (ANCEF) IVPB 2g/100 mL premix        2 g 200 mL/hr over 30 Minutes Intravenous On call to O.R. 04/09/22 0506 04/09/22 1201       Subjective: Seen and examined at bedside and she is sitting in the chair and states that she is fairly doing well.  No nausea or vomiting.  Ambulating the halls.  Denied chest pain or shortness of breath.  Heart rate is maintaining sinus.  No other concerns or complaints at this time.  Objective: Vitals:   04/13/22 1043 04/13/22 1447 04/13/22 2037 04/14/22 0601  BP: 128/69 (!) 154/65 (!) 156/62 (!) 144/66  Pulse: 87 90 94 83  Resp: '17 17 18 18  '$ Temp: 97.9 F (36.6 C) (!) 97.2 F (36.2 C) 98.7 F (37.1 C) 97.8 F (36.6 C)  TempSrc: Oral Oral Oral Oral  SpO2: 96% 95% 94% 98%  Weight:      Height:        Intake/Output Summary (Last 24 hours) at 04/14/2022 1237 Last data filed at 04/14/2022 0900 Gross per 24 hour  Intake 955.14 ml  Output 1000 ml  Net -44.86 ml   Filed Weights   04/12/22 0310 04/12/22 2139  Weight: 79 kg 85.2 kg   Examination: Physical Exam:  Constitutional: WN/WD obese African-American female currently no acute distress appears calm Respiratory: Diminished to auscultation bilaterally, no wheezing, rales, rhonchi or crackles. Normal respiratory effort and patient is not tachypenic. No accessory muscle use.  Cardiovascular: RRR, no murmurs / rubs / gallops. S1 and S2 auscultated.  Trace extremity edema Abdomen: Soft, non-tender, distended second body habitus. Bowel sounds positive.  GU:  Deferred. Musculoskeletal: No clubbing / cyanosis of digits/nails. No joint deformity upper and lower extremities.  Neurologic: CN 2-12 grossly intact with no focal deficits. Romberg sign and cerebellar reflexes not assessed.  Psychiatric: Normal judgment and insight. Alert and oriented x 3. Normal mood and appropriate affect.   Data Reviewed: I have personally reviewed following labs and imaging studies  CBC: Recent Labs  Lab 04/11/22 0228 04/11/22 1304 04/12/22 0419 04/12/22 1815 04/13/22 0604 04/14/22 0603  WBC 11.3* 12.7* 12.5*  --  15.0* 12.9*  NEUTROABS  --   --   --   --   --  9.0*  HGB 7.7* 8.5* 7.4* 9.2* 8.6* 8.5*  HCT 24.8* 27.2* 24.2* 29.2* 27.2* 27.1*  MCV 87.0 87.2 88.0  --  87.7 87.7  PLT 296 331 271  --  319 539   Basic Metabolic Panel: Recent Labs  Lab 04/10/22 0838 04/11/22 0228 04/12/22 0419 04/12/22 1504 04/13/22 0604 04/14/22 0603  NA 134* 135 135  --  138 139  K 4.1 4.3 4.1  --  4.1 3.5  CL 102 104 104  --  109 106  CO2 '26 25 25  '$ --  24 25  GLUCOSE 148* 153* 122*  --  109* 96  BUN '14 13 12  '$ --  15 11  CREATININE 0.71 0.80 0.90  --  1.06* 0.85  CALCIUM 8.8* 8.4* 8.6*  --  9.0 9.0  MG  --   --   --  1.7  --  1.6*  PHOS  --   --   --   --   --  3.2   GFR: Estimated Creatinine Clearance: 65.5 mL/min (by C-G formula based on SCr of 0.85 mg/dL). Liver Function Tests: Recent Labs  Lab 04/14/22 0603  AST 13*  ALT 14  ALKPHOS 190*  BILITOT 0.3  PROT 6.2*  ALBUMIN 2.8*   No results for input(s): "LIPASE", "AMYLASE" in the last 168 hours. No results for input(s): "AMMONIA" in the last 168 hours. Coagulation Profile: No results for input(s): "INR", "PROTIME" in the last 168 hours. Cardiac Enzymes: No results for input(s): "CKTOTAL", "CKMB", "CKMBINDEX", "TROPONINI" in the last 168 hours. BNP (last 3 results) No results for input(s): "PROBNP" in the last 8760 hours. HbA1C: No results for input(s): "HGBA1C" in the last 72 hours. CBG: Recent  Labs  Lab 04/13/22 1206 04/13/22 1634 04/13/22 2204 04/14/22 0801 04/14/22 1137  GLUCAP 106* 110* 121* 113* 98   Lipid Profile: No results for input(s): "CHOL", "HDL", "LDLCALC", "TRIG", "CHOLHDL", "LDLDIRECT" in the last 72 hours. Thyroid Function Tests: Recent Labs    04/12/22 1504  TSH 3.817   Anemia Panel: No results for input(s): "VITAMINB12", "FOLATE", "FERRITIN", "TIBC", "IRON", "RETICCTPCT" in the last 72 hours. Sepsis Labs: No results for input(s): "PROCALCITON", "LATICACIDVEN" in the last 168 hours.  Recent Results (from the past 240 hour(s))  Urine Culture     Status: None   Collection Time: 04/09/22  8:01 AM   Specimen: Urine, Catheterized  Result Value Ref Range Status   Specimen Description   Final    URINE, CATHETERIZED Performed at [facility] [address], [place], [place] [address]    Special Requests   Final    NONE Performed at [facility], [place] [address], [place], [place] [address]    Culture   Final    NO GROWTH Performed at [facility], [place] [address], [place], [place] [address]    Report Status 04/10/2022 FINAL  Final  MRSA Next Gen by PCR, Nasal     Status: None   Collection Time: 04/12/22  2:20 PM   Specimen: Nasal Mucosa; Nasal Swab  Result Value Ref Range Status   MRSA by PCR Next Gen NOT DETECTED NOT DETECTED Final    Comment: (NOTE) The GeneXpert MRSA Assay (FDA approved for NASAL specimens only), is one component of a comprehensive MRSA colonization surveillance program. It is not intended to diagnose MRSA infection nor to guide or monitor treatment for MRSA infections. Test performance is not FDA approved in patients less than 31 years old. Performed at [facility], [place] [address], [place], [place] [address]   Culture, blood (Routine X 2) w Reflex to ID Panel     Status: None (Preliminary result)   Collection Time: 04/13/22  2:36 PM   Specimen: Right  Antecubital; Blood  Result Value Ref Range Status   Specimen Description   Final    RIGHT ANTECUBITAL Performed at [facility] [address] [place], [place] [address]    Special Requests   Final    BOTTLES DRAWN AEROBIC ONLY Blood Culture adequate volume Performed at [facility] [address], [place], [place] [address]    Culture   Final    NO GROWTH < 24 HOURS Performed at [facility] [address], [place], [place] [address]    Report Status PENDING  Incomplete  Culture, blood (Routine X 2) w Reflex to ID Panel     Status: None (Preliminary result)   Collection Time: 04/13/22  4:53 PM   Specimen: BLOOD  Result Value Ref Range Status   Specimen Description   Final    BLOOD RIGHT ANTECUBITAL Performed at [facility] [address], [place], [place] [address]    Special Requests   Final    BOTTLES DRAWN AEROBIC AND ANAEROBIC Blood Culture adequate volume Performed at [facility] [address], [place], [place] [address]    Culture   Final    NO GROWTH < 12 HOURS Performed at [facility] [address], [place], [place] [address]    Report Status PENDING  Incomplete     Radiology Studies: DG Chest 2 View  Result Date: 04/13/2022 CLINICAL DATA:  Leukocytosis.  Recent abdominal surgery. EXAM: CHEST - 2 VIEW COMPARISON:  12/30/2021 and prior studies FINDINGS: Cardiomediastinal silhouette is unchanged. A RIGHT Port-A-Cath is present with tip overlying the UPPER RIGHT atrium. This is a low volume study with peribronchial thickening bilaterally noted. No definite airspace disease, pleural effusion or pneumothorax noted. No significant abnormalities identified in the visualized UPPER abdomen. There is no evidence of gross pneumoperitoneum. IMPRESSION: Low volume study with peribronchial thickening bilaterally. No definite airspace disease. Electronically Signed   By: Margarette Canada M.D.    On: 04/13/2022 12:37   ECHOCARDIOGRAM COMPLETE  Result Date: 04/12/2022    ECHOCARDIOGRAM REPORT   Patient Name:   AMILEY SHISHIDO Date of Exam: 04/12/2022 Medical Rec #:  [id number]        Height:       63.0 in Accession #:    [id number]       Weight:       174.2 lb Date of Birth:  [birth year]         BSA:          1.823 m Patient Age:    63 years         BP:           132/71 mmHg Patient Gender: F                HR:           128 bpm. Exam Location:  Inpatient Procedure: 2D Echo, Cardiac Doppler and Color Doppler Indications:    Atrial Fibrillation I48.91  History:        Patient has no prior history of Echocardiogram examinations.                 Risk Factors:Hypertension and Diabetes. Anemia. GERD. New onset                 of Afib post surgery.  Sonographer:    Darlina Sicilian RDCS Referring Phys: [id number] DAVID MANUEL [place]  1. Left ventricular ejection fraction, by estimation, is 60 to 65%. The left ventricle has normal function. The left ventricle has no regional wall motion abnormalities. Left ventricular diastolic parameters are indeterminate.  2. Right ventricular systolic function is normal. The right ventricular size is normal. There is moderately elevated pulmonary artery systolic pressure.  3. The pericardial effusion is posterior to the left ventricle.  4. The mitral valve is normal in structure. No evidence of mitral valve regurgitation. No evidence of mitral stenosis.  5. The aortic valve is normal in structure. Aortic valve regurgitation is not visualized. No aortic stenosis is present.  6. The inferior vena cava is normal in size with greater than 50% respiratory variability, suggesting right atrial pressure of 3 mmHg. FINDINGS  Left Ventricle:  Left ventricular ejection fraction, by estimation, is 60 to 65%. The left ventricle has normal function. The left ventricle has no regional wall motion abnormalities. The left ventricular internal cavity size was normal in size. There is  no  left ventricular hypertrophy. Left ventricular diastolic parameters are indeterminate. Right Ventricle: The right ventricular size is normal. No increase in right ventricular wall thickness. Right ventricular systolic function is normal. There is moderately elevated pulmonary artery systolic pressure. The tricuspid regurgitant velocity is 3.44 m/s, and with an assumed right atrial pressure of 3 mmHg, the estimated right ventricular systolic pressure is 26.8 mmHg. Left Atrium: Left atrial size was normal in size. Right Atrium: Right atrial size was normal in size. Pericardium: Trivial pericardial effusion is present. The pericardial effusion is posterior to the left ventricle. Mitral Valve: The mitral valve is normal in structure. No evidence of mitral valve regurgitation. No evidence of mitral valve stenosis. Tricuspid Valve: The tricuspid valve is normal in structure. Tricuspid valve regurgitation is mild . No evidence of tricuspid stenosis. Aortic Valve: The aortic valve is normal in structure. Aortic valve regurgitation is not visualized. No aortic stenosis is present. Pulmonic Valve: The pulmonic valve was normal in structure. Pulmonic valve regurgitation is not visualized. No evidence of pulmonic stenosis. Aorta: The aortic root is normal in size and structure. Venous: The inferior vena cava is normal in size with greater than 50% respiratory variability, suggesting right atrial pressure of 3 mmHg. IAS/Shunts: No atrial level shunt detected by color flow Doppler.  LEFT VENTRICLE PLAX 2D LVIDd:         4.30 cm   Diastology LVIDs:         2.20 cm   LV e' medial:    7.94 cm/s LV PW:         0.90 cm   LV E/e' medial:  9.6 LV IVS:        0.90 cm   LV e' lateral:   9.57 cm/s LVOT diam:     1.70 cm   LV E/e' lateral: 8.0 LV SV:         52 LV SV Index:   29 LVOT Area:     2.27 cm  RIGHT VENTRICLE RV S prime:     18.00 cm/s TAPSE (M-mode): 2.3 cm LEFT ATRIUM             Index LA diam:        3.70 cm 2.03 cm/m LA Vol  (A2C):   38.5 ml 21.12 ml/m LA Vol (A4C):   29.7 ml 16.29 ml/m LA Biplane Vol: 34.2 ml 18.76 ml/m  AORTIC VALVE LVOT Vmax:   125.00 cm/s LVOT Vmean:  77.800 cm/s LVOT VTI:    0.230 m  AORTA Ao Root diam: 2.40 cm Ao Asc diam:  2.70 cm MITRAL VALVE               TRICUSPID VALVE MV Area (PHT): 4.15 cm    TR Peak grad:   47.3 mmHg MV Decel Time: 183 msec    TR Vmax:        344.00 cm/s MV E velocity: 76.30 cm/s MV A velocity: 72.40 cm/s  SHUNTS MV E/A ratio:  1.05        Systemic VTI:  0.23 m                            Systemic Diam: 1.70 cm Jenkins Rouge MD Electronically signed by  Jenkins Rouge MD Signature Date/Time: 04/12/2022/2:18:28 PM    Final      Scheduled Meds:  atorvastatin  20 mg Oral QHS   chewing gum (ORBIT) sugar free  1 Stick Oral TID WC   Chlorhexidine Gluconate Cloth  6 each Topical Daily   diltiazem  120 mg Oral Daily   dorzolamide-timolol  1 drop Both Eyes BID   enoxaparin (LOVENOX) injection  40 mg Subcutaneous Q24H   feeding supplement (GLUCERNA SHAKE)  237 mL Oral TID BM   ibuprofen  600 mg Oral Q6H   insulin aspart  0-15 Units Subcutaneous TID WC   insulin aspart  0-5 Units Subcutaneous QHS   latanoprost  1 drop Both Eyes QHS   liraglutide  1.2 mg Subcutaneous Daily   metFORMIN  1,000 mg Oral BID WC   pantoprazole  40 mg Oral Daily   polyethylene glycol  17 g Oral Daily   senna-docusate  2 tablet Oral QHS   sodium chloride flush  10-40 mL Intracatheter Q12H   Continuous Infusions:   LOS: 5 days   Raiford Noble, DO [facility] Available via Epic secure chat 7am-7pm After these hours, please refer to coverage provider listed on [email or web address] 04/14/2022, 12:37 PM

## 2022-04-14 NOTE — Plan of Care (Signed)
  Problem: Education: Goal: Ability to describe self-care measures that may prevent or decrease complications (Diabetes Survival Skills Education) will improve Outcome: Progressing   Problem: Coping: Goal: Ability to adjust to condition or change in health will improve Outcome: Progressing   Problem: Fluid Volume: Goal: Ability to maintain a balanced intake and output will improve Outcome: Progressing   Problem: Health Behavior/Discharge Planning: Goal: Ability to identify and utilize available resources and services will improve Outcome: Progressing Goal: Ability to manage health-related needs will improve Outcome: Progressing   Problem: Metabolic: Goal: Ability to maintain appropriate glucose levels will improve Outcome: Progressing   Problem: Nutritional: Goal: Maintenance of adequate nutrition will improve Outcome: Progressing Goal: Progress toward achieving an optimal weight will improve Outcome: Progressing   Problem: Skin Integrity: Goal: Risk for impaired skin integrity will decrease Outcome: Progressing   Problem: Tissue Perfusion: Goal: Adequacy of tissue perfusion will improve Outcome: Progressing   Problem: Education: Goal: Knowledge of General Education information will improve Description: Including pain rating scale, medication(s)/side effects and non-pharmacologic comfort measures Outcome: Progressing   Problem: Health Behavior/Discharge Planning: Goal: Ability to manage health-related needs will improve Outcome: Progressing   Problem: Clinical Measurements: Goal: Ability to maintain clinical measurements within normal limits will improve Outcome: Progressing Goal: Will remain free from infection Outcome: Progressing Goal: Diagnostic test results will improve Outcome: Progressing Goal: Respiratory complications will improve Outcome: Progressing Goal: Cardiovascular complication will be avoided Outcome: Progressing   Problem: Activity: Goal:  Risk for activity intolerance will decrease Outcome: Progressing   Problem: Nutrition: Goal: Adequate nutrition will be maintained Outcome: Progressing   Problem: Coping: Goal: Level of anxiety will decrease Outcome: Progressing   Problem: Elimination: Goal: Will not experience complications related to bowel motility Outcome: Progressing Goal: Will not experience complications related to urinary retention Outcome: Progressing   Problem: Pain Managment: Goal: General experience of comfort will improve Outcome: Progressing   Problem: Safety: Goal: Ability to remain free from injury will improve Outcome: Progressing   Problem: Skin Integrity: Goal: Risk for impaired skin integrity will decrease Outcome: Progressing   Problem: Education: Goal: Individualized Educational Video(s) Outcome: Not Applicable   

## 2022-04-14 NOTE — Progress Notes (Signed)
5 Days Post-Op Procedure(s) (LRB): LAPAROSCOPY DIAGNOSTIC (N/A) HYSTERECTOMY ABDOMINAL BILATERAL SALPINGO OOPHORECTOMY WITH OMENTECTOMY ,DEBULKING (Bilateral)  Subjective: Patient reports doing well. Pain controlled. Ambulated in the hallway. Voiding without symptoms, + flatus and BMx2 yesterday. Denies fevers. Minimal spotting yesterday, only when wipes after voiding. No cardiac symptoms.   Objective: Vital signs in last 24 hours: Temp:  [97.2 F (36.2 C)-98.7 F (37.1 C)] 97.8 F (36.6 C) (06/18 0601) Pulse Rate:  [83-94] 83 (06/18 0601) Resp:  [17-18] 18 (06/18 0601) BP: (128-156)/(62-69) 144/66 (06/18 0601) SpO2:  [94 %-98 %] 98 % (06/18 0601) Last BM Date : 04/12/22  Intake/Output from previous day: 06/17 0701 - 06/18 0700 In: 1315.1 [P.O.:1080; I.V.:235.1] Out: 750 [Urine:750]  Physical Examination: Gen: alert, cooperative, and no distress Pulm: clear to auscultation bilaterally, no wheezes or rhonchi CV: regular rate and rhythm, no murmurs or rubs Abd: midline incision with honeycomb dressing present, c/d/I; abdomen soft, non-tympanic, not distended, mildly hypoactive bowel sounds, no tenderness to palpation Ext: trace bilateral pedal edema, non-pitting, no calf tenderness, SCDs in place  Labs:    Latest Ref Rng & Units 04/14/2022    6:03 AM 04/13/2022    6:04 AM 04/12/2022    6:15 PM  CBC  WBC 4.0 - 10.5 K/uL 12.9  15.0    Hemoglobin 12.0 - 15.0 g/dL 8.5  8.6  9.2   Hematocrit 36.0 - 46.0 % 27.1  27.2  29.2   Platelets 150 - 400 K/uL 353  319        Latest Ref Rng & Units 04/13/2022    6:04 AM 04/12/2022    4:19 AM 04/11/2022    2:28 AM  BMP  Glucose 70 - 99 mg/dL 109  122  153   BUN 8 - 23 mg/dL '15  12  13   '$ Creatinine 0.44 - 1.00 mg/dL 1.06  0.90  0.80   Sodium 135 - 145 mmol/L 138  135  135   Potassium 3.5 - 5.1 mmol/L 4.1  4.1  4.3   Chloride 98 - 111 mmol/L 109  104  104   CO2 22 - 32 mmol/L '24  25  25   '$ Calcium 8.9 - 10.3 mg/dL 9.0  8.6  8.4      Assessment:  71 y.o. s/p Procedure(s): LAPAROSCOPY DIAGNOSTIC HYSTERECTOMY ABDOMINAL BILATERAL SALPINGO OOPHORECTOMY WITH OMENTECTOMY ,DEBULKING: meeting postoperative milestones   Pain:  Well controlled. Continue oral regimen.   Heme: Acute on chronic anemia. Hgb stable after transfusion on 6/16.  Asymptomatic when ambulating.    ID: leukocytosis, decreased from yesterday. Patient without symptoms suspicious for infection. Remains afebrile. UA negative for evidence of UTI and CXR negative.    CV: New onset a fib - appreciate medicine's and cardiology's assistance. ECHO performed 5/16. Continue holding on tx dosing anticoagulation given recent surgery, continue prophylactic lovenox and SCDs. Per patient, cardiology to see again on Monday.   GI:  Tolerating PO. Continue diabetic diet. Has had return of bowel function.   FEN: Nutritional supplements ordered.    Endo: Good glycemic control, continue current regimen.   Prophylaxis: lovenox, SCDs.   Plan: Continue routine postop care.  The patient is to be discharged to home, anticipate tomorrow after seen by cardiology.   LOS: 5 days    Lafonda Mosses 04/14/2022, 6:43 AM

## 2022-04-15 ENCOUNTER — Other Ambulatory Visit (HOSPITAL_COMMUNITY): Payer: Self-pay

## 2022-04-15 ENCOUNTER — Telehealth: Payer: Self-pay

## 2022-04-15 ENCOUNTER — Telehealth: Payer: Self-pay | Admitting: Internal Medicine

## 2022-04-15 ENCOUNTER — Encounter: Payer: Self-pay | Admitting: *Deleted

## 2022-04-15 DIAGNOSIS — C579 Malignant neoplasm of female genital organ, unspecified: Secondary | ICD-10-CM | POA: Diagnosis not present

## 2022-04-15 DIAGNOSIS — D62 Acute posthemorrhagic anemia: Secondary | ICD-10-CM | POA: Diagnosis not present

## 2022-04-15 DIAGNOSIS — D508 Other iron deficiency anemias: Secondary | ICD-10-CM | POA: Diagnosis not present

## 2022-04-15 DIAGNOSIS — I4891 Unspecified atrial fibrillation: Secondary | ICD-10-CM | POA: Diagnosis not present

## 2022-04-15 LAB — CBC WITH DIFFERENTIAL/PLATELET
Abs Immature Granulocytes: 0.53 10*3/uL — ABNORMAL HIGH (ref 0.00–0.07)
Basophils Absolute: 0 10*3/uL (ref 0.0–0.1)
Basophils Relative: 0 %
Eosinophils Absolute: 0.2 10*3/uL (ref 0.0–0.5)
Eosinophils Relative: 2 %
HCT: 26.5 % — ABNORMAL LOW (ref 36.0–46.0)
Hemoglobin: 8.4 g/dL — ABNORMAL LOW (ref 12.0–15.0)
Immature Granulocytes: 5 %
Lymphocytes Relative: 15 %
Lymphs Abs: 1.7 10*3/uL (ref 0.7–4.0)
MCH: 27.4 pg (ref 26.0–34.0)
MCHC: 31.7 g/dL (ref 30.0–36.0)
MCV: 86.3 fL (ref 80.0–100.0)
Monocytes Absolute: 1.2 10*3/uL — ABNORMAL HIGH (ref 0.1–1.0)
Monocytes Relative: 10 %
Neutro Abs: 7.9 10*3/uL — ABNORMAL HIGH (ref 1.7–7.7)
Neutrophils Relative %: 68 %
Platelets: 350 10*3/uL (ref 150–400)
RBC: 3.07 MIL/uL — ABNORMAL LOW (ref 3.87–5.11)
RDW: 22.1 % — ABNORMAL HIGH (ref 11.5–15.5)
WBC: 11.6 10*3/uL — ABNORMAL HIGH (ref 4.0–10.5)
nRBC: 0.2 % (ref 0.0–0.2)

## 2022-04-15 LAB — COMPREHENSIVE METABOLIC PANEL
ALT: 12 U/L (ref 0–44)
AST: 12 U/L — ABNORMAL LOW (ref 15–41)
Albumin: 2.8 g/dL — ABNORMAL LOW (ref 3.5–5.0)
Alkaline Phosphatase: 149 U/L — ABNORMAL HIGH (ref 38–126)
Anion gap: 7 (ref 5–15)
BUN: 9 mg/dL (ref 8–23)
CO2: 26 mmol/L (ref 22–32)
Calcium: 9.2 mg/dL (ref 8.9–10.3)
Chloride: 108 mmol/L (ref 98–111)
Creatinine, Ser: 0.72 mg/dL (ref 0.44–1.00)
GFR, Estimated: >60 mL/min (ref >60)
Glucose, Bld: 111 mg/dL — ABNORMAL HIGH (ref 70–99)
Potassium: 3.9 mmol/L (ref 3.5–5.1)
Sodium: 141 mmol/L (ref 135–145)
Total Bilirubin: 0.5 mg/dL (ref 0.3–1.2)
Total Protein: 6 g/dL — ABNORMAL LOW (ref 6.5–8.1)

## 2022-04-15 LAB — GLUCOSE, CAPILLARY
Glucose-Capillary: 111 mg/dL — ABNORMAL HIGH (ref 70–99)
Glucose-Capillary: 100 mg/dL — ABNORMAL HIGH (ref 70–99)

## 2022-04-15 LAB — MAGNESIUM: Magnesium: 1.7 mg/dL (ref 1.7–2.4)

## 2022-04-15 LAB — PHOSPHORUS: Phosphorus: 3.5 mg/dL (ref 2.5–4.6)

## 2022-04-15 MED ORDER — DILTIAZEM HCL ER COATED BEADS 120 MG PO CP24
120.0000 mg | ORAL_CAPSULE | Freq: Every day | ORAL | 2 refills | Status: DC
  Filled 2022-04-15: qty 30, 30d supply, fill #0
  Filled 2022-05-14: qty 30, 30d supply, fill #1
  Filled 2022-06-13: qty 30, 30d supply, fill #2

## 2022-04-15 MED ORDER — RIVAROXABAN 10 MG PO TABS: 10.0000 mg | ORAL_TABLET | Freq: Every day | ORAL | 0 refills | Status: DC

## 2022-04-15 MED ORDER — HEPARIN SOD (PORK) LOCK FLUSH 100 UNIT/ML IV SOLN
500.0000 [IU] | INTRAVENOUS | Status: AC | PRN
  Administered 2022-04-15: 500 [IU]

## 2022-04-15 MED ORDER — TRAMADOL HCL 50 MG PO TABS: 50.0000 mg | ORAL_TABLET | Freq: Four times a day (QID) | ORAL | 0 refills | Status: DC | PRN

## 2022-04-15 MED ORDER — MAGNESIUM SULFATE 2 GM/50ML IV SOLN
2.0000 g | Freq: Once | INTRAVENOUS | Status: AC
  Administered 2022-04-15: 2 g via INTRAVENOUS
  Filled 2022-04-15: qty 50

## 2022-04-15 NOTE — Plan of Care (Signed)
  Problem: Health Behavior/Discharge Planning: Goal: Ability to identify and utilize available resources and services will improve Outcome: Progressing   Problem: Health Behavior/Discharge Planning: Goal: Ability to manage health-related needs will improve Outcome: Progressing   Problem: Skin Integrity: Goal: Risk for impaired skin integrity will decrease Outcome: Adequate for Discharge   Problem: Clinical Measurements: Goal: Cardiovascular complication will be avoided Outcome: Adequate for Discharge   Problem: Pain Managment: Goal: General experience of comfort will improve Outcome: Adequate for Discharge   Problem: Skin Integrity: Goal: Risk for impaired skin integrity will decrease Outcome: Adequate for Discharge   Problem: Education: Goal: Ability to describe self-care measures that may prevent or decrease complications (Diabetes Survival Skills Education) will improve Outcome: Completed/Met   Problem: Coping: Goal: Ability to adjust to condition or change in health will improve Outcome: Completed/Met   Problem: Fluid Volume: Goal: Ability to maintain a balanced intake and output will improve Outcome: Completed/Met   Problem: Health Behavior/Discharge Planning: Goal: Ability to manage health-related needs will improve Outcome: Completed/Met   Problem: Metabolic: Goal: Ability to maintain appropriate glucose levels will improve Outcome: Completed/Met   Problem: Nutritional: Goal: Maintenance of adequate nutrition will improve Outcome: Completed/Met Goal: Progress toward achieving an optimal weight will improve Outcome: Completed/Met   Problem: Tissue Perfusion: Goal: Adequacy of tissue perfusion will improve Outcome: Completed/Met   Problem: Education: Goal: Knowledge of General Education information will improve Description: Including pain rating scale, medication(s)/side effects and non-pharmacologic comfort measures Outcome: Completed/Met   Problem:  Clinical Measurements: Goal: Ability to maintain clinical measurements within normal limits will improve Outcome: Completed/Met Goal: Will remain free from infection Outcome: Completed/Met Goal: Diagnostic test results will improve Outcome: Completed/Met Goal: Respiratory complications will improve Outcome: Completed/Met   Problem: Activity: Goal: Risk for activity intolerance will decrease Outcome: Completed/Met   Problem: Nutrition: Goal: Adequate nutrition will be maintained Outcome: Completed/Met   Problem: Coping: Goal: Level of anxiety will decrease Outcome: Completed/Met   Problem: Elimination: Goal: Will not experience complications related to bowel motility Outcome: Completed/Met Goal: Will not experience complications related to urinary retention Outcome: Completed/Met   Problem: Safety: Goal: Ability to remain free from injury will improve Outcome: Completed/Met

## 2022-04-15 NOTE — Progress Notes (Signed)
Patient ID: Sherri Gill, female   DOB: 1951/03/18, 71 y.o.   MRN: [id number] Patient enrolled for [employer] to ship a 30 day cardiac event monitor to her address on file. Letter with instructions mailed to patient.  Dr. Debara Pickett to read.

## 2022-04-15 NOTE — Progress Notes (Signed)
PROGRESS NOTE    Sherri Gill  JJO:[id number] DOB: 1951-10-15 DOA: 04/09/2022 PCP: [name], MD   Brief Narrative:  The patient is a obese 70 year old African-American female with a past medical history significant for but not limited to hypertension, diabetes mellitus type 2, iron deficiency anemia, GERD, depression and anxiety as well as endometrial cancer who just underwent hysterectomy and bilateral salpingo-oophorectomy with omentectomy and debulking who developed new onset atrial fibrillation with heart rates in the 130s.  She denied any alcohol tobacco or drug use and she states that she drank coffee in the morning.  Postoperatively she had this A-fib with RVR and states that her heart was racing.  TRH was asked to evaluate for his new onset atrial fibrillation and she was given Cardizem which helped her with rate control.  Cardiology was also consulted they reviewed the telemetry and she has converted back to sinus rhythm and maintained this overnight.  Echocardiogram was done and showed a normal systolic function and cardiology recommends converting her short acting diltiazem to once a day Cardizem CD 124 her to stay on that.  Currently she not anticoagulated due to her recent acute on chronic anemia with requirement for transfusion but cardiology recommending a 30-day monitor after discharge if she has any recurrent A-fib or if it is procedure related and recommending holding off anticoagulation at this time.  WBC is improving along with her renal function.  Mag was a little low so we will replete.  UA negative and chest x-ray negative for any evidence of infection.  Blood count remained stable and patient is likely being D/C'd today per Primary team. She is medically stable to D/C and will need to follow up with her PCP within 1-2 weeks.    Assessment and Plan:  Gynecologic malignancy [facility]) Endometrial cancer ([place]) -Continue postop care per primary team and primary management  per them   New onset atrial fibrillation ([place]) -CHA2DS2-VASc Score of 5 out of 9  (Age, DM, female, aortic atherosclerosis, HTN) -It is a considerable risk for embolic CVA.  -Rate control with Cardizem 10 mg IVP x1. -Transferred to PCU in case infusion needed. -No anticoagulation for now given her recent acute on chronic anemia. -Begin diltiazem 30 mg p.o. every 6 hours and cardiology is consolidated to Cardizem CD 120 mg p.o. daily. -Beta-Blocker with Metoprolol Tartrate 5 mg IV every 6 as needed for sustained heart rate greater than 105 -Keep electrolytes optimized. -ECHOCardiogram was obtained and showed EF of 60-65% with no wall motion abnormalities with LV Diastolic Parameters are indeterminate   -Cardiology recommending Continuing Long-Acting Diltiazem and they are recommending arranging 30 day outpatient monitor and D/Cing from a Cardiac Perspective and following up within 6-8 weeks after she completes 1 month of monitoring    Elevated TSH history -Checked TSH level and was 3.817 -Follow up with PCP    Essential Hypertension -Discontinue amlodipine to avoid reflex tachycardia. -Begin diltiazem as above and now consolidate to Cardizem CD 120 mg Daily. -Resume Home Losartan at D/C given that Renal Fxn is improved -Continue to Monitor blood pressure and heart rate. -Last BP reading was 152/64   Type 2 diabetes mellitus with neurological complications (HCC) -Carbohydrate modified diet. -CBG monitoring before meals and bedtime. -Continue metformin 1000 mg p.o. daily. -Analgesics as needed for neuropathy. -Monitor CBGs per protocol; CBGs ranging from 98-111   AKI -Patient's BUN/Cr went from 14/0.71 -> 13/0.80 -> 12/0.90 -> 15/1.06 -> 11/0.85 -> 9/0.72 -Will give NS 75 mL/hr  x12 hours and now stopped  -Continue to Monitor function carefully and avoid nephrotoxic medications, contrast dyes, hypotension and dehydration and renally adjust medications -Recommend Judicious use of  NSAIDS -Repeat CMP in a.m.   Leukocytosis -Likely reactive but is trending up and WBC went from 9.4 -> 11.3 -> 12.7 -> 12.5 -> 15.0 -> 12.9 -> 11.6 -Checking blood cultures x2 and the primary team is ordered urinalysis and Chest x-ray -Getting an incentive spirometer -MRSA PCR was negative -Continue to monitor for signs and symptoms of infection -Urinalysis was done and showed clear appearance with negative bilirubin, yellow color urine, moderate hemoglobin, negative ketones, negative nitrites, negative leukocytes, rare bacteria, 6-10 RBCs per high-power field and 0-5 WBCs -Chest x-ray showed "Low volume study with peribronchial thickening bilaterally. No definite airspace disease." -Follow-up on blood cultures and continue to monitor and repeat CBC in AM -Blood Cx x2 showed NGTD at 2 Days   Acute on Chronic Blood Loss Anemia Iron Deficiency Anemia -Received PRBC transfusion -Patient's hemoglobin/hematocrit went from 8.9/27.8 -> 7.7/24.8 -> 8.5/27.2 -> 7.4/24.2 -> 9.2/29.2 -> 8.6/27.2 -> 8.5/27.1 -> 8.4/26.5 -Continue to monitor for signs and symptoms of bleeding; no overt bleeding noted -Repeat CBC within 1 week    Dyslipidemia associated with type 2 diabetes mellitus (HCC) -Continue Atorvastatin 20 mg p.o. daily.   Hypomagnesemia -Mag Level is now 1.7 -Replete with IV Mag Sulfate 2 grams prior to D/C -Continue to Monitor and Replete as Necessary -Repeat Mag Level in the AM    Obesity -Complicates overall prognosis and care -Estimated body mass index is 33.27 kg/m as calculated from the following:   Height as of this encounter: '5\' 3"'$  (1.6 m).   Weight as of this encounter: 85.2 kg.  -Weight Loss and Dietary Counseling given     DVT prophylaxis: enoxaparin (LOVENOX) injection 40 mg Start: 04/10/22 1200 SCDs Start: 04/09/22 1445    Code Status: Full Code Family Communication: No family present at bedside  Disposition Plan:  Level of care: Progressive Status is:  Inpatient Remains inpatient appropriate because: Gyn-Onc is primary and getting Discharged today   Consultants:  [facility] Cardiology GYN-ONC (Primary)  Procedures:  Diagnostic laparoscopy, conversion to exploratory laparotomy, partial omentectomy, lysis of adhesions for approximately 60 minutes, total abdominal hysterectomy, bilateral salpingo-oophorectomy, excision of ileal tumor implant and excision of transverse colon epiploica tumor implant  ECHOCARDIOGRAM IMPRESSIONS     1. Left ventricular ejection fraction, by estimation, is 60 to 65%. The  left ventricle has normal function. The left ventricle has no regional  wall motion abnormalities. Left ventricular diastolic parameters are  indeterminate.   2. Right ventricular systolic function is normal. The right ventricular  size is normal. There is moderately elevated pulmonary artery systolic  pressure.   3. The pericardial effusion is posterior to the left ventricle.   4. The mitral valve is normal in structure. No evidence of mitral valve  regurgitation. No evidence of mitral stenosis.   5. The aortic valve is normal in structure. Aortic valve regurgitation is  not visualized. No aortic stenosis is present.   6. The inferior vena cava is normal in size with greater than 50%  respiratory variability, suggesting right atrial pressure of 3 mmHg.   FINDINGS   Left Ventricle: Left ventricular ejection fraction, by estimation, is 60  to 65%. The left ventricle has normal function. The left ventricle has no  regional wall motion abnormalities. The left ventricular internal cavity  size was normal in size. There is  no left ventricular hypertrophy. Left ventricular diastolic parameters  are indeterminate.   Right Ventricle: The right ventricular size is normal. No increase in  right ventricular wall thickness. Right ventricular systolic function is  normal. There is moderately elevated pulmonary artery systolic pressure.  The  tricuspid regurgitant velocity is  3.44 m/s, and with an assumed right atrial pressure of 3 mmHg, the  estimated right ventricular systolic pressure is 47.8 mmHg.   Left Atrium: Left atrial size was normal in size.   Right Atrium: Right atrial size was normal in size.   Pericardium: Trivial pericardial effusion is present. The pericardial  effusion is posterior to the left ventricle.   Mitral Valve: The mitral valve is normal in structure. No evidence of  mitral valve regurgitation. No evidence of mitral valve stenosis.   Tricuspid Valve: The tricuspid valve is normal in structure. Tricuspid  valve regurgitation is mild . No evidence of tricuspid stenosis.   Aortic Valve: The aortic valve is normal in structure. Aortic valve  regurgitation is not visualized. No aortic stenosis is present.   Pulmonic Valve: The pulmonic valve was normal in structure. Pulmonic valve  regurgitation is not visualized. No evidence of pulmonic stenosis.   Aorta: The aortic root is normal in size and structure.   Venous: The inferior vena cava is normal in size with greater than 50%  respiratory variability, suggesting right atrial pressure of 3 mmHg.   IAS/Shunts: No atrial level shunt detected by color flow Doppler.      LEFT VENTRICLE  PLAX 2D  LVIDd:         4.30 cm   Diastology  LVIDs:         2.20 cm   LV e' medial:    7.94 cm/s  LV PW:         0.90 cm   LV E/e' medial:  9.6  LV IVS:        0.90 cm   LV e' lateral:   9.57 cm/s  LVOT diam:     1.70 cm   LV E/e' lateral: 8.0  LV SV:         52  LV SV Index:   29  LVOT Area:     2.27 cm      RIGHT VENTRICLE  RV S prime:     18.00 cm/s  TAPSE (M-mode): 2.3 cm   LEFT ATRIUM             Index  LA diam:        3.70 cm 2.03 cm/m  LA Vol (A2C):   38.5 ml 21.12 ml/m  LA Vol (A4C):   29.7 ml 16.29 ml/m  LA Biplane Vol: 34.2 ml 18.76 ml/m   AORTIC VALVE  LVOT Vmax:   125.00 cm/s  LVOT Vmean:  77.800 cm/s  LVOT VTI:    0.230 m      AORTA  Ao Root diam: 2.40 cm  Ao Asc diam:  2.70 cm   MITRAL VALVE               TRICUSPID VALVE  MV Area (PHT): 4.15 cm    TR Peak grad:   47.3 mmHg  MV Decel Time: 183 msec    TR Vmax:        344.00 cm/s  MV E velocity: 76.30 cm/s  MV A velocity: 72.40 cm/s  SHUNTS  MV E/A ratio:  1.05        Systemic VTI:  0.23 m  Systemic Diam: 1.70 cm     Antimicrobials:  Anti-infectives (From admission, onward)    Start     Dose/Rate Route Frequency Ordered Stop   04/09/22 1149  ceFAZolin (ANCEF) 2-4 GM/100ML-% IVPB       Note to Pharmacy: Jasmine Pang K: cabinet override      04/09/22 1149 04/09/22 2359   04/09/22 0600  ceFAZolin (ANCEF) IVPB 2g/100 mL premix        2 g 200 mL/hr over 30 Minutes Intravenous On call to O.R. 04/09/22 0506 04/09/22 1201        Subjective: Seen and examined at bedside and she is sitting in the chair bedside. No CP or SOB. Feels relatively well. No complaints or concerns at this time.   Objective: Vitals:   04/14/22 0601 04/14/22 1355 04/14/22 2155 04/15/22 0631  BP: (!) 144/66 (!) 151/69 (!) 166/70 (!) 152/64  Pulse: 83 86 88 75  Resp: '18 18 16 18  '$ Temp: 97.8 F (36.6 C)  98.3 F (36.8 C) 98.3 F (36.8 C)  TempSrc: Oral  Oral Oral  SpO2: 98% 96% 97% 99%  Weight:      Height:        Intake/Output Summary (Last 24 hours) at 04/15/2022 0915 Last data filed at 04/15/2022 0715 Gross per 24 hour  Intake 650 ml  Output --  Net 650 ml   Filed Weights   04/12/22 0310 04/12/22 2139  Weight: 79 kg 85.2 kg    Examination: Physical Exam:  Constitutional: WN/WD obese AAF in NAD appears calm and comfortable  Respiratory: Diminished to auscultation bilaterally, no wheezing, rales, rhonchi or crackles. Normal respiratory effort and patient is not tachypenic. No accessory muscle use. Unlabored breathing  Cardiovascular: RRR, no murmurs / rubs / gallops. S1 and S2 auscultated.  Abdomen: Soft, non-tender, Distended 2/2  body habitus. Bowel sounds positive.  GU: Deferred. Musculoskeletal: No clubbing / cyanosis of digits/nails. No joint deformity upper and lower extremities. Skin: No rashes, lesions, ulcers on a limited skin evaluation. No induration; Warm and dry.  Neurologic: CN 2-12 grossly intact with no focal deficits. Romberg sign and cerebellar reflexes not assessed.  Psychiatric: Normal judgment and insight. Alert and oriented x 3. Normal mood and appropriate affect.   Data Reviewed: I have personally reviewed following labs and imaging studies  CBC: Recent Labs  Lab 04/11/22 1304 04/12/22 0419 04/12/22 1815 04/13/22 0604 04/14/22 0603 04/15/22 0338  WBC 12.7* 12.5*  --  15.0* 12.9* 11.6*  NEUTROABS  --   --   --   --  9.0* 7.9*  HGB 8.5* 7.4* 9.2* 8.6* 8.5* 8.4*  HCT 27.2* 24.2* 29.2* 27.2* 27.1* 26.5*  MCV 87.2 88.0  --  87.7 87.7 86.3  PLT 331 271  --  319 353 833   Basic Metabolic Panel: Recent Labs  Lab 04/11/22 0228 04/12/22 0419 04/12/22 1504 04/13/22 0604 04/14/22 0603 04/15/22 0338  NA 135 135  --  138 139 141  K 4.3 4.1  --  4.1 3.5 3.9  CL 104 104  --  109 106 108  CO2 25 25  --  '24 25 26  '$ GLUCOSE 153* 122*  --  109* 96 111*  BUN 13 12  --  '15 11 9  '$ CREATININE 0.80 0.90  --  1.06* 0.85 0.72  CALCIUM 8.4* 8.6*  --  9.0 9.0 9.2  MG  --   --  1.7  --  1.6* 1.7  PHOS  --   --   --   --  3.2 3.5   GFR: Estimated Creatinine Clearance: 69.6 mL/min (by C-G formula based on SCr of 0.72 mg/dL). Liver Function Tests: Recent Labs  Lab 04/14/22 0603 04/15/22 0338  AST 13* 12*  ALT 14 12  ALKPHOS 190* 149*  BILITOT 0.3 0.5  PROT 6.2* 6.0*  ALBUMIN 2.8* 2.8*   No results for input(s): "LIPASE", "AMYLASE" in the last 168 hours. No results for input(s): "AMMONIA" in the last 168 hours. Coagulation Profile: No results for input(s): "INR", "PROTIME" in the last 168 hours. Cardiac Enzymes: No results for input(s): "CKTOTAL", "CKMB", "CKMBINDEX", "TROPONINI" in the last  168 hours. BNP (last 3 results) No results for input(s): "PROBNP" in the last 8760 hours. HbA1C: No results for input(s): "HGBA1C" in the last 72 hours. CBG: Recent Labs  Lab 04/14/22 0801 04/14/22 1137 04/14/22 1616 04/14/22 2152 04/15/22 0736  GLUCAP 113* 98 105* 109* 111*   Lipid Profile: No results for input(s): "CHOL", "HDL", "LDLCALC", "TRIG", "CHOLHDL", "LDLDIRECT" in the last 72 hours. Thyroid Function Tests: Recent Labs    04/12/22 1504  TSH 3.817   Anemia Panel: No results for input(s): "VITAMINB12", "FOLATE", "FERRITIN", "TIBC", "IRON", "RETICCTPCT" in the last 72 hours. Sepsis Labs: No results for input(s): "PROCALCITON", "LATICACIDVEN" in the last 168 hours.  Recent Results (from the past 240 hour(s))  Urine Culture     Status: None   Collection Time: 04/09/22  8:01 AM   Specimen: Urine, Catheterized  Result Value Ref Range Status   Specimen Description   Final    URINE, CATHETERIZED Performed at [facility] [address] [place], [place] [address]    Special Requests   Final    NONE Performed at [facility], [place] [address] [place], [place] [address]    Culture   Final    NO GROWTH Performed at [facility], [place] [address], [place], [place] [address]    Report Status 04/10/2022 FINAL  Final  MRSA Next Gen by PCR, Nasal     Status: None   Collection Time: 04/12/22  2:20 PM   Specimen: Nasal Mucosa; Nasal Swab  Result Value Ref Range Status   MRSA by PCR Next Gen NOT DETECTED NOT DETECTED Final    Comment: (NOTE) The GeneXpert MRSA Assay (FDA approved for NASAL specimens only), is one component of a comprehensive MRSA colonization surveillance program. It is not intended to diagnose MRSA infection nor to guide or monitor treatment for MRSA infections. Test performance is not FDA approved in patients less than 85 years old. Performed at [facility], [place] [address] [place], [place] [address]   Culture, blood (Routine X 2) w Reflex to ID Panel     Status: None (Preliminary result)   Collection Time: 04/13/22  2:36 PM   Specimen: BLOOD  Result Value Ref Range Status   Specimen Description   Final    BLOOD RIGHT ANTECUBITAL Performed at [facility], [place] [address], [place], [place] [address]    Special Requests   Final    BOTTLES DRAWN AEROBIC ONLY Blood Culture adequate volume Performed at [facility] [address] [place], [place] [address]    Culture   Final    NO GROWTH 2 DAYS Performed at [facility] [address], [place], [place] [address]    Report Status PENDING  Incomplete  Culture, blood (Routine X 2) w Reflex to ID Panel     Status: None (  Preliminary result)   Collection Time: 04/13/22  4:53 PM   Specimen: BLOOD  Result Value Ref Range Status   Specimen Description   Final    BLOOD RIGHT ANTECUBITAL Performed at [facility] [address] [place], [place] [address]    Special Requests   Final    BOTTLES DRAWN AEROBIC AND ANAEROBIC Blood Culture adequate volume Performed at [facility] [address], [place], [place] [address]    Culture   Final    NO GROWTH 2 DAYS Performed at [facility] [address], [place], [place] [address]    Report Status PENDING  Incomplete     Radiology Studies: DG Chest 2 View  Result Date: 04/13/2022 CLINICAL DATA:  Leukocytosis.  Recent abdominal surgery. EXAM: CHEST - 2 VIEW COMPARISON:  12/30/2021 and prior studies FINDINGS: Cardiomediastinal silhouette is unchanged. A RIGHT Port-A-Cath is present with tip overlying the UPPER RIGHT atrium. This is a low volume study with peribronchial thickening bilaterally noted. No definite airspace disease, pleural effusion or pneumothorax noted. No significant abnormalities identified in the visualized UPPER abdomen. There is no evidence of gross pneumoperitoneum.  IMPRESSION: Low volume study with peribronchial thickening bilaterally. No definite airspace disease. Electronically Signed   By: Margarette Canada M.D.   On: 04/13/2022 12:37     Scheduled Meds:  atorvastatin  20 mg Oral QHS   chewing gum (ORBIT) sugar free  1 Stick Oral TID WC   Chlorhexidine Gluconate Cloth  6 each Topical Daily   diltiazem  120 mg Oral Daily   dorzolamide-timolol  1 drop Both Eyes BID   enoxaparin (LOVENOX) injection  40 mg Subcutaneous Q24H   feeding supplement (GLUCERNA SHAKE)  237 mL Oral TID BM   ibuprofen  600 mg Oral Q6H   insulin aspart  0-15 Units Subcutaneous TID WC   insulin aspart  0-5 Units Subcutaneous QHS   latanoprost  1 drop Both Eyes QHS   liraglutide  1.2 mg Subcutaneous Daily   metFORMIN  1,000 mg Oral BID WC   pantoprazole  40 mg Oral Daily   polyethylene glycol  17 g Oral Daily   senna-docusate  2 tablet Oral QHS   sodium chloride flush  10-40 mL Intracatheter Q12H   Continuous Infusions:  magnesium sulfate bolus IVPB      LOS: 6 days   Raiford Noble, DO [facility] Available via Epic secure chat 7am-7pm After these hours, please refer to coverage provider listed on [email or web address] 04/15/2022, 9:15 AM

## 2022-04-15 NOTE — Telephone Encounter (Signed)
RN reached out to Dr. Lysbeth Penner office to clarify if the patient is to continue norvasc and losartan when she goes home. Spoke with Chloe in his office she is going to give the message to Dr. Lysbeth Penner nurse and they will call our office and follow up.

## 2022-04-15 NOTE — Telephone Encounter (Signed)
Sherri Gill with Dr. Charisse March office followed up, again advising they are unable to discharge the patient until receiving clarification on cardiac medications. I reached out to RN and confirmed Sherri Gill will have to contact cardmaster to determine who is covering at the hospital for inpatient medication recommendation/discharge instructions . Provided Sherri Gill with cardmaster pager #. No further questions at this time.

## 2022-04-15 NOTE — TOC Transition Note (Signed)
Transition of Care [facility]) - CM/SW Discharge Note   Patient Details  Name: CALLEEN ALVIS MRN: [id number] Date of Birth: 24-Mar-1951  [facility] CM/SW Contact:  Leeroy Cha, RN Phone Number: 04/15/2022, 1:52 PM   Clinical Narrative:     [facility] is aftive with patient. Rep made aware of continuation of services.  Final next level of care: [place] Barriers to Discharge: No Barriers Identified   Patient Goals and CMS Choice Patient states their goals for this hospitalization and ongoing recovery are:: to go home CMS Medicare.gov Compare Post Acute Care list provided to:: Patient Choice offered to / list presented to : Patient  Discharge Placement                       Discharge Plan and Services   Discharge Planning Services: CM Consult Post Acute Care Choice: Home Health                    HH Arranged: PT [place]: [place] Date [place]: 04/15/22 Time [place]: [phone] Representative spoke with at [place]: [place]ele  Social Determinants of Health (SDOH) Interventions     Readmission Risk Interventions     No data to display

## 2022-04-15 NOTE — Plan of Care (Signed)
  Problem: Education: Goal: Ability to describe self-care measures that may prevent or decrease complications (Diabetes Survival Skills Education) will improve Outcome: Completed/Met   Problem: Coping: Goal: Ability to adjust to condition or change in health will improve Outcome: Completed/Met   Problem: Fluid Volume: Goal: Ability to maintain a balanced intake and output will improve Outcome: Completed/Met   Problem: Health Behavior/Discharge Planning: Goal: Ability to identify and utilize available resources and services will improve 04/15/2022 1409 by Annie Sable, RN Outcome: Completed/Met 04/15/2022 0815 by Annie Sable, RN Outcome: Progressing Goal: Ability to manage health-related needs will improve Outcome: Completed/Met   Problem: Metabolic: Goal: Ability to maintain appropriate glucose levels will improve Outcome: Completed/Met   Problem: Nutritional: Goal: Maintenance of adequate nutrition will improve Outcome: Completed/Met Goal: Progress toward achieving an optimal weight will improve Outcome: Completed/Met   Problem: Skin Integrity: Goal: Risk for impaired skin integrity will decrease 04/15/2022 1409 by Annie Sable, RN Outcome: Completed/Met 04/15/2022 0815 by Annie Sable, RN Outcome: Adequate for Discharge   Problem: Tissue Perfusion: Goal: Adequacy of tissue perfusion will improve Outcome: Completed/Met   Problem: Education: Goal: Knowledge of General Education information will improve Description: Including pain rating scale, medication(s)/side effects and non-pharmacologic comfort measures Outcome: Completed/Met   Problem: Health Behavior/Discharge Planning: Goal: Ability to manage health-related needs will improve 04/15/2022 1409 by Annie Sable, RN Outcome: Completed/Met 04/15/2022 0815 by Annie Sable, RN Outcome: Progressing   Problem: Clinical Measurements: Goal: Ability to maintain clinical measurements within normal limits  will improve Outcome: Completed/Met Goal: Will remain free from infection Outcome: Completed/Met Goal: Diagnostic test results will improve Outcome: Completed/Met Goal: Respiratory complications will improve Outcome: Completed/Met Goal: Cardiovascular complication will be avoided 04/15/2022 1409 by Annie Sable, RN Outcome: Completed/Met 04/15/2022 0815 by Annie Sable, RN Outcome: Adequate for Discharge   Problem: Activity: Goal: Risk for activity intolerance will decrease Outcome: Completed/Met   Problem: Nutrition: Goal: Adequate nutrition will be maintained Outcome: Completed/Met   Problem: Coping: Goal: Level of anxiety will decrease Outcome: Completed/Met   Problem: Elimination: Goal: Will not experience complications related to bowel motility Outcome: Completed/Met Goal: Will not experience complications related to urinary retention Outcome: Completed/Met   Problem: Pain Managment: Goal: General experience of comfort will improve 04/15/2022 1409 by Annie Sable, RN Outcome: Completed/Met 04/15/2022 0815 by Annie Sable, RN Outcome: Adequate for Discharge   Problem: Safety: Goal: Ability to remain free from injury will improve Outcome: Completed/Met   Problem: Skin Integrity: Goal: Risk for impaired skin integrity will decrease 04/15/2022 1409 by Annie Sable, RN Outcome: Completed/Met 04/15/2022 0815 by Annie Sable, RN Outcome: Adequate for Discharge

## 2022-04-15 NOTE — TOC Initial Note (Signed)
Transition of Care [facility]) - Initial/Assessment Note    Patient Details  Name: Sherri Gill MRN: [id number] Date of Birth: 1951-04-01  Transition of Care [facility]) CM/SW Contact:    Leeroy Cha, RN Phone Number: 04/15/2022, 10:06 AM  Clinical Narrative:                 Patient being dcd to home today with resumption of hhc through [place] for p.t.,  Rep. At [facility] notified.  Expected Discharge Plan: [place] Barriers to Discharge: No Barriers Identified   Patient Goals and CMS Choice Patient states their goals for this hospitalization and ongoing recovery are:: to go home CMS Medicare.gov Compare Post Acute Care list provided to:: Patient Choice offered to / list presented to : Patient  Expected Discharge Plan and Services Expected Discharge Plan: [place]   Discharge Planning Services: CM Consult Post Acute Care Choice: [place] arrangements for the past 2 months: Apartment                           HH Arranged: PT HH Agency: [place] Date [place]: 04/15/22 Time HH Agency Contacted: 8 Representative spoke with at [place]: peele  Prior Living Arrangements/Services Living arrangements for the past 2 months: [place] with:: Self   Do you feel safe going back to the place where you live?: Yes               Activities of Daily Living Home Assistive Devices/Equipment: CBG Meter ADL Screening (condition at time of admission) Patient's cognitive ability adequate to safely complete daily activities?: Yes Is the patient deaf or have difficulty hearing?: No Does the patient have difficulty seeing, even when wearing glasses/contacts?: No Does the patient have difficulty concentrating, remembering, or making decisions?: No Patient able to express need for assistance with ADLs?: Yes Does the patient have difficulty dressing or bathing?: No Independently performs ADLs?: Yes  (appropriate for developmental age) Does the patient have difficulty walking or climbing stairs?: No Weakness of Legs: None Weakness of Arms/Hands: None  Permission Sought/Granted                  Emotional Assessment Appearance:: Appears stated age     Orientation: : Oriented to Self, Oriented to Place, Oriented to  Time, Oriented to Situation Alcohol / Substance Use: Not Applicable Psych Involvement: No (comment)  Admission diagnosis:  Gynecologic malignancy ([place]) [C57.9] Endometrial cancer ([place]) [C54.1] Patient Active Problem List   Diagnosis Date Noted   New onset atrial fibrillation ([place]) 04/12/2022   Acute on chronic blood loss anemia 04/12/2022   Gynecologic malignancy ([place]) 04/09/2022   Endometrial cancer ([place]) 04/09/2022   Hypomagnesemia 03/08/2022   Hypokalemia 02/14/2022   Chronic nausea 01/15/2022   Uterine cancer ([place]) 01/14/2022   Malignant ascites 12/28/2021   Endometrial thickening on ultrasound 10/15/2021   Need for tetanus booster 27/12/5007   Umbilical hernia 38/18/2993   Elevated TSH 05/31/2021   Radicular pain in left arm 11/10/2020   Routine health maintenance 11/10/2020   Personal history of colonic polyps 11/10/2020   Elevated alkaline phosphatase level 03/17/2016   Carpal tunnel syndrome 02/11/2014   Iron deficiency anemia 08/29/2012   Obesity (BMI 30.0-34.9) 06/11/2012   Dyslipidemia associated with type 2 diabetes mellitus ([place]) 06/30/2007   Major depressive disorder with single episode, in remission ([place]) 06/30/2007   Essential hypertension 06/30/2007   GERD 06/30/2007  Type 2 diabetes mellitus with neurological complications ([place]) 17/51/0258   PCP:  [name], MD Pharmacy:   [facility] [place] [address] Phone: [phone] Fax: [phone]     Social Determinants of Health (SDOH) Interventions    Readmission Risk Interventions     No data to display

## 2022-04-15 NOTE — Discharge Summary (Signed)
Physician Discharge Summary  Patient ID: Sherri Gill MRN: [id number] DOB/AGE: 69-Oct-1952 69 y.o.  Admit date: 04/09/2022 Discharge date: 04/15/2022  Admission Diagnoses: Gynecologic malignancy [facility])  Discharge Diagnoses:  Principal Problem:   Gynecologic malignancy [facility]) Active Problems:   Dyslipidemia associated with type 2 diabetes mellitus ([place])   Essential hypertension   Iron deficiency anemia   Type 2 diabetes mellitus with neurological complications (HCC)   Elevated TSH   Endometrial cancer ([place])   New onset atrial fibrillation (H[facility])   Acute on chronic blood loss anemia   Discharged Condition:  The patient is in good condition and stable for discharge.    Hospital Course: On 04/09/2022, the patient underwent the following: Procedure(s): LAPAROSCOPY DIAGNOSTIC HYSTERECTOMY ABDOMINAL BILATERAL SALPINGO OOPHORECTOMY WITH OMENTECTOMY ,DEBULKING. The postoperative course included new onset atrial fibrillation starting on 04/12/22 requiring close monitoring, medical and cardiology consultation.  She was discharged to home on postoperative day 6 tolerating a regular diet, having BMs and passing flatus, ambulating, pain controlled with oral medications, voiding without difficulty, in regular rhythm and doing well on Cardizem CD with plans for follow up with cardiology in the near future. She is discharged home with home health PT to continue.   Consults: Cardiology, Hospitalist Team, PT/OT  Significant Diagnostic Studies: Labs, Chest xray on 04/13/22, Echo on 04/12/22  Treatments: Surgery: see above, IV hydration, Initiation of Cardizem CD per cardiology, telemetry   Discharge Exam (am assessment per [name]): Blood pressure (!) 153/62, pulse 83, temperature 97.7 F (36.5 C), temperature source Oral, resp. rate 16, height _0  (1.6 m), weight 187 lb 12.8 oz (85.2 kg), SpO2 96 %. Gen: alert, cooperative, and no distress Pulm: clear to auscultation bilaterally, no wheezes or  rhonchi CV: regular rate and rhythm, no murmurs or rubs Abd: midline incision with honeycomb dressing present, c/d/I; abdomen soft, non-tympanic, not distended, active bowel sounds, no tenderness to palpation Ext: trace bilateral pedal edema, non-pitting, no calf tenderness, SCDs in place   Disposition: Discharge disposition: 01-Home or Self Care       Discharge Instructions     Call MD for:  difficulty breathing, headache or visual disturbances   Complete by: As directed    Call MD for:  extreme fatigue   Complete by: As directed    Call MD for:  hives   Complete by: As directed    Call MD for:  persistant dizziness or light-headedness   Complete by: As directed    Call MD for:  persistant nausea and vomiting   Complete by: As directed    Call MD for:  redness, tenderness, or signs of infection (pain, swelling, redness, odor or green/yellow discharge around incision site)   Complete by: As directed    Call MD for:  severe uncontrolled pain   Complete by: As directed    Call MD for:  temperature >100.4   Complete by: As directed    Diet - low sodium heart healthy   Complete by: As directed    Discharge wound care:   Complete by: As directed    Keep incision clean and dry.   Driving Restrictions   Complete by: As directed    No driving for 2 week(s) from surgery.  Do not take narcotics and drive. You need to make sure your reaction time has returned.   Increase activity slowly   Complete by: As directed    Lifting restrictions   Complete by: As directed    No lifting greater than  10 lbs, pushing, pulling, straining for 6 weeks.   Sexual Activity Restrictions   Complete by: As directed    No sexual activity, nothing in the vagina, for 8 weeks.      Allergies as of 04/15/2022       Reactions   Codeine Sulfate Nausea And Vomiting, Other (See Comments)   Dizziness   Penicillins Nausea And Vomiting   Percocet [oxycodone-acetaminophen] Nausea And Vomiting   Zestril  [lisinopril] Cough        Medication List     STOP taking these medications    amLODipine 10 MG tablet Commonly known as: NORVASC   ibuprofen 600 MG tablet Commonly known as: ADVIL       TAKE these medications    acetaminophen 500 MG tablet Commonly known as: TYLENOL Take 1,000 mg by mouth every 6 (six) hours as needed for mild pain.   albuterol 108 (90 Base) MCG/ACT inhaler Commonly known as: VENTOLIN HFA Inhale 2 puffs into the lungs every 4 (four) hours as needed for wheezing or shortness of breath.   atorvastatin 20 MG tablet Commonly known as: LIPITOR Take 1 tablet (20 mg total) by mouth at bedtime.   B-complex with vitamin C tablet Take 1 tablet by mouth in the morning.   Cholecalciferol 25 MCG (1000 UT) capsule Take 1 capsule (1,000 Units total) by mouth daily.   dexamethasone 4 MG tablet Commonly known as: DECADRON Take 2 tablets by mouth the night before and 2 tablets the morning of chemotherapy, every 3 weeks for 6 cycles   diltiazem 120 MG 24 hr capsule Commonly known as: CARDIZEM CD Take 1 capsule (120 mg total) by mouth daily. Start taking on: April 16, 2022   dorzolamide-timolol 22.3-6.8 MG/ML ophthalmic solution Commonly known as: COSOPT Place 1 drop into both eyes 2 (two) times daily.   glucose blood test strip Commonly known as: ONE TOUCH ULTRA TEST Use as instructed   Insulin Pen Needle 32G X 4 MM Misc 1 each by Does not apply route daily.   latanoprost 0.005 % ophthalmic solution Commonly known as: XALATAN Place 1 drop into both eyes at bedtime.   lidocaine-prilocaine cream Commonly known as: EMLA Apply to affected area once   losartan 100 MG tablet Commonly known as: COZAAR Take 100 mg by mouth at bedtime.   magnesium oxide 400 (240 Mg) MG tablet Commonly known as: MAG-OX Take 1 tablet by mouth 2 times daily.   metFORMIN 1000 MG tablet Commonly known as: GLUCOPHAGE Take 1,000 mg by mouth 2 (two) times daily.    ondansetron 8 MG tablet Commonly known as: Zofran Take 1 tablet (8 mg total) by mouth 2 (two) times daily as needed. Start on the third day after chemotherapy.   ONE TOUCH ULTRA MINI w/Device Kit Please use as directed.   OneTouch Delica Lancets 29U Misc Please use as directed.   pantoprazole 40 MG tablet Commonly known as: Protonix Take 1 tablet by mouth daily.   polyethylene glycol powder 17 GM/SCOOP powder Commonly known as: GLYCOLAX/MIRALAX Take 17 g by mouth daily.   prochlorperazine 10 MG tablet Commonly known as: COMPAZINE Take 1 tablet (10 mg total) by mouth every 6 (six) hours as needed (Nausea or vomiting).   rivaroxaban 10 MG Tabs tablet Commonly known as: XARELTO Take 1 tablet (10 mg total) by mouth daily. Start the day after discharge from hospital Start taking on: April 16, 2022   senna-docusate 8.6-50 MG tablet Commonly known as: Senokot-S Take 2 tablets  by mouth at bedtime. For AFTER surgery, do not take if having diarrhea   traMADol 50 MG tablet Commonly known as: ULTRAM Take 1 tablet (50 mg total) by mouth every 6 (six) hours as needed for severe pain. For AFTER surgery only What changed: additional instructions   Victoza 18 MG/3ML Sopn Generic drug: liraglutide PLEASE START TAKING VICTOZA 0.6MG DAILY WEEK 1, THEN TAKE 1.2MG DAILY WEEK 2, AND THEN TAKE 1.8MG DAILY THEREAFTER What changed:  how much to take how to take this when to take this additional instructions               Discharge Care Instructions  (From admission, onward)           Start     Ordered   04/15/22 0000  Discharge wound care:       Comments: Keep incision clean and dry.   04/15/22 1257            Follow-up Information     Lafonda Mosses, MD Follow up on 05/06/2022.   Specialty: Gynecologic Oncology Why: at 2:15pm at the [facility] with [name]. Contact information: [place] [address] [phone]         Pixie Casino, MD Follow up.   Specialty: Cardiology Why: [facility] - cardiology office will mail you a heart monitor to wear for 30 days. They will also be calling you to arrange your post-hospital follow-up. Contact information: [address]  [place] [address] [phone]                 Greater than thirty minutes were spend for face to face discharge instructions and discharge orders/summary in EPIC.   Signed: Dorothyann Gibbs 04/15/2022, 1:44 PM

## 2022-04-15 NOTE — Telephone Encounter (Signed)
Spoke with Dr. Marlou Porch regarding patients outpatient medication. Per Dr. Marlou Porch patient is to stop the norvasc and take the cardizem. Patient is to resume losartan. [name], NP notified.

## 2022-04-15 NOTE — Progress Notes (Signed)
Progress Note  Patient Name: Sherri Gill Date of Encounter: 04/15/2022  [facility] HeartCare Cardiologist: Pixie Casino, MD   Subjective   About to go home.  Feeling better.  No further atrial fibrillation.  Inpatient Medications    Scheduled Meds:  atorvastatin  20 mg Oral QHS   chewing gum (ORBIT) sugar free  1 Stick Oral TID WC   Chlorhexidine Gluconate Cloth  6 each Topical Daily   diltiazem  120 mg Oral Daily   dorzolamide-timolol  1 drop Both Eyes BID   enoxaparin (LOVENOX) injection  40 mg Subcutaneous Q24H   feeding supplement (GLUCERNA SHAKE)  237 mL Oral TID BM   ibuprofen  600 mg Oral Q6H   insulin aspart  0-15 Units Subcutaneous TID WC   insulin aspart  0-5 Units Subcutaneous QHS   latanoprost  1 drop Both Eyes QHS   liraglutide  1.2 mg Subcutaneous Daily   metFORMIN  1,000 mg Oral BID WC   pantoprazole  40 mg Oral Daily   polyethylene glycol  17 g Oral Daily   senna-docusate  2 tablet Oral QHS   sodium chloride flush  10-40 mL Intracatheter Q12H   Continuous Infusions:  PRN Meds: acetaminophen, HYDROmorphone (DILAUDID) injection, HYDROmorphone, levalbuterol, metoprolol tartrate, ondansetron **OR** ondansetron (ZOFRAN) IV, mouth rinse, sodium chloride flush, traMADol   Vital Signs    Vitals:   04/14/22 0601 04/14/22 1355 04/14/22 2155 04/15/22 0631  BP: (!) 144/66 (!) 151/69 (!) 166/70 (!) 152/64  Pulse: 83 86 88 75  Resp: '18 18 16 18  '$ Temp: 97.8 F (36.6 C)  98.3 F (36.8 C) 98.3 F (36.8 C)  TempSrc: Oral  Oral Oral  SpO2: 98% 96% 97% 99%  Weight:      Height:        Intake/Output Summary (Last 24 hours) at 04/15/2022 1232 Last data filed at 04/15/2022 1000 Gross per 24 hour  Intake 796.83 ml  Output --  Net 796.83 ml      04/12/2022    9:39 PM 04/12/2022    3:10 AM 04/05/2022    1:16 PM  Last 3 Weights  Weight (lbs) 187 lb 12.8 oz 174 lb 2.6 oz 176 lb  Weight (kg) 85.186 kg 79 kg 79.833 kg      Telemetry    SR - Personally  Reviewed  ECG    No new - Personally Reviewed  Physical Exam   GEN: No acute distress.   Neck: No JVD Cardiac: RRR, no murmurs, rubs, or gallops.  Respiratory: Clear to auscultation bilaterally. GI: Soft, nontender, non-distended  MS: No edema; No deformity. Neuro:  Nonfocal  Psych: Normal affect   Labs    High Sensitivity Troponin:   Recent Labs  Lab 04/12/22 1504 04/12/22 1815  TROPONINIHS 4 5     Chemistry Recent Labs  Lab 04/12/22 1504 04/13/22 0604 04/14/22 0603 04/15/22 0338  NA  --  138 139 141  K  --  4.1 3.5 3.9  CL  --  109 106 108  CO2  --  '24 25 26  '$ GLUCOSE  --  109* 96 111*  BUN  --  '15 11 9  '$ CREATININE  --  1.06* 0.85 0.72  CALCIUM  --  9.0 9.0 9.2  MG 1.7  --  1.6* 1.7  PROT  --   --  6.2* 6.0*  ALBUMIN  --   --  2.8* 2.8*  AST  --   --  13* 12*  ALT  --   --  14 12  ALKPHOS  --   --  190* 149*  BILITOT  --   --  0.3 0.5  GFRNONAA  --  57* >60 >60  ANIONGAP  --  '5 8 7    '$ Lipids No results for input(s): "CHOL", "TRIG", "HDL", "LABVLDL", "LDLCALC", "CHOLHDL" in the last 168 hours.  Hematology Recent Labs  Lab 04/13/22 0604 04/14/22 0603 04/15/22 0338  WBC 15.0* 12.9* 11.6*  RBC 3.10* 3.09* 3.07*  HGB 8.6* 8.5* 8.4*  HCT 27.2* 27.1* 26.5*  MCV 87.7 87.7 86.3  MCH 27.7 27.5 27.4  MCHC 31.6 31.4 31.7  RDW 22.9* 22.4* 22.1*  PLT 319 353 350   Thyroid  Recent Labs  Lab 04/12/22 1504  TSH 3.817    BNP Recent Labs  Lab 04/12/22 1504  BNP 98.9    DDimer No results for input(s): "DDIMER" in the last 168 hours.    Patient Profile     71 y.o. female with new onset paroxysmal atrial fibrillation with rapid ventricular response in the setting of gynecologic malignancy on diltiazem 120.  Assessment & Plan    Paroxysmal atrial fibrillation - Transient.  Doing well on Cardizem CD 120 mg a day.  Currently maintaining sinus rhythm. -No recommendation at this time for long term anticoagulation given recent surgery transfusion and  anemia as well as short-term atrial fibrillation duration.  Discussed with her.  Risks of bleeding would outweigh benefits. -A 30-day event monitor will be sent to her house.  This has been set up.  Essential hypertension -There were some questions about her home medications whether or not she should continue Norvasc and losartan.  I think that since she is on diltiazem now, I would stop the Norvasc.  I would place her back on her losartan.  Her blood pressures look like they would tolerate this.  Anemia - Status post transfusion  Laparoscopic abdominal bilateral oophorectomy with omental debulking - She will be placed on Xarelto by [name] for postoperative prophylaxis short-term.  Watch for any signs of bleeding.  [place] for DC  For questions or updates, please contact [place] Please consult [email or web address] for contact info under        Signed, Candee Furbish, MD  04/15/2022, 12:32 PM

## 2022-04-15 NOTE — Telephone Encounter (Signed)
Sherri Gill from Dr. Charisse March office wanted to speak to one of Sherri Gill nurses concerning a medication the pt is on. She ask that you call asap so they can discharge her from hospital.

## 2022-04-15 NOTE — Care Management Important Message (Signed)
Important Message  Patient Details IM Letter given to the Patient. Name: Sherri Gill MRN: [id number] Date of Birth: [birth year]   Medicare Important Message Given:  Yes     Kerin Salen 04/15/2022, 1:07 PM

## 2022-04-15 NOTE — Telephone Encounter (Signed)
Spoke with Natale Milch at [place] office. Patient is supposed to be discharged today but OB-Gyn NP wants clarification if patient should continue diltiazem + amlodipine and losartan. Explained this is a question for patient's inpatient hospital nurse and cardiology rounding team to clarify, as patient is still admitted. Advised she reach out to hospital unit for this med clarification

## 2022-04-15 NOTE — Progress Notes (Signed)
Occupational Therapy Treatment Patient Details Name: Sherri Gill MRN: [id number] DOB: Jun 10, 1951 Today's Date: 04/15/2022   History of present illness 71 y.o. female with a past medical history of hypertension, type II DM, class I obesity, iron deficiency anemia, GERD, depression, and endometrial cancer who was admitted 04/09/22 for hysterectomy with bilateral salpingo-oophorectomy with omentectomy and debulking. Pt developed new onset of atrial fibrillation with a heart rate in the 130s   OT comments  Patient was educated on AE use for LB dressing. Patient verbalized understanding. Patient was able to use figure four method on this date with increased time. Patient was educated on donning pants starting with leg that is least mobile. Patient verbalized and demonstrated understanding. Patient would continue to benefit from skilled OT services at this time while admitted and after d/c to address noted deficits in order to improve overall safety and independence in ADLs.     Recommendations for follow up therapy are one component of a multi-disciplinary discharge planning process, led by the attending physician.  Recommendations may be updated based on patient status, additional functional criteria and insurance authorization.    Follow Up Recommendations  Home health OT    Assistance Recommended at Discharge Intermittent Supervision/Assistance  Patient can return home with the following  A little help with bathing/dressing/bathroom;Help with stairs or ramp for entrance   Equipment Recommendations  Tub/shower bench    Recommendations for Other Services      Precautions / Restrictions Precautions Precautions: Fall Required Braces or Orthoses: Other Brace Other Brace: abdominal binder in place Restrictions Weight Bearing Restrictions: No       Mobility Bed Mobility                    Transfers                         Balance Overall balance assessment: Mild  deficits observed, not formally tested                                         ADL either performed or assessed with clinical judgement   ADL Overall ADL's : Needs assistance/impaired                       Lower Body Dressing Details (indicate cue type and reason): patient was educated on using reacher with patient reporting that she preferred to use four cross method instead. patient was able to don/doff socks and pants with MI on this date. Toilet Transfer: Modified Programmer, applications Details (indicate cue type and reason): trasnfer from edge of recliner to 3 in 1commode with increased time Toileting- Clothing Manipulation and Hygiene: Modified independent;Sit to/from stand              Extremity/Trunk Assessment              Vision       Perception     Praxis      Cognition Arousal/Alertness: Awake/alert Behavior During Therapy: WFL for tasks assessed/performed Overall Cognitive Status: Within Functional Limits for tasks assessed                                          Exercises      Shoulder  Instructions       General Comments      Pertinent Vitals/ Pain       Pain Assessment Pain Assessment: Faces Faces Pain Scale: Hurts even more Pain Location: surgical site Pain Descriptors / Indicators: Grimacing, Guarding, Operative site guarding Pain Intervention(s): Monitored during session, Patient requesting pain meds-RN notified, RN gave pain meds during session  Home Living                                          Prior Functioning/Environment              Frequency  Min 2X/week        Progress Toward Goals  OT Goals(current goals can now be found in the care plan section)  Progress towards OT goals: Progressing toward goals     Plan Discharge plan remains appropriate    Co-evaluation                 AM-PAC OT "6 Clicks" Daily Activity     Outcome  Measure   Help from another person eating meals?: None Help from another person taking care of personal grooming?: None Help from another person toileting, which includes using toliet, bedpan, or urinal?: A Little Help from another person bathing (including washing, rinsing, drying)?: A Little Help from another person to put on and taking off regular upper body clothing?: None Help from another person to put on and taking off regular lower body clothing?: A Little 6 Click Score: 21    End of Session    OT Visit Diagnosis: Unsteadiness on feet (R26.81);Pain   Activity Tolerance Patient tolerated treatment well   Patient Left with call bell/phone within reach;in chair;with family/visitor present;with nursing/sitter in room   Nurse Communication Mobility status        Time: 1211-1227 OT Time Calculation (min): 16 min  Charges: OT General Charges $OT Visit: 1 Visit OT Treatments $Self Care/Home Management : 8-22 mins  Jackelyn Poling OTR/L, MS Acute Rehabilitation Department Office# [phone] Pager# [phone]   Marcellina Millin 04/15/2022, 1:00 PM

## 2022-04-15 NOTE — Discharge Summary (Signed)
6 Days Post-Op Procedure(s) (LRB): LAPAROSCOPY DIAGNOSTIC (N/A) HYSTERECTOMY ABDOMINAL BILATERAL SALPINGO OOPHORECTOMY WITH OMENTECTOMY ,DEBULKING (Bilateral)  Subjective: Patient reports doing well. Pain controlled. Reporting a pinching feeling deep in her abdomen with tylenol relieving this. Ambulating in the halls. Voiding without symptoms, + flatus and having BM yesterday. Denies fevers. Minimal spotting yesterday, only when wipes after voiding. No cardiac symptoms. No concerns voiced.   Objective: Vital signs in last 24 hours: Temp:  [98.3 F (36.8 C)] 98.3 F (36.8 C) (06/19 0631) Pulse Rate:  [75-88] 75 (06/19 0631) Resp:  [16-18] 18 (06/19 0631) BP: (151-166)/(64-70) 152/64 (06/19 0631) SpO2:  [96 %-99 %] 99 % (06/19 0631) Last BM Date : 04/14/22  Intake/Output from previous day: 06/18 0701 - 06/19 0700 In: 770 [P.O.:720; IV Piggyback:50] Out: -   Physical Examination: Gen: alert, cooperative, and no distress Pulm: clear to auscultation bilaterally, no wheezes or rhonchi CV: regular rate and rhythm, no murmurs or rubs Abd: midline incision with honeycomb dressing present, c/d/I; abdomen soft, non-tympanic, not distended, active bowel sounds, no tenderness to palpation Ext: trace bilateral pedal edema, non-pitting, no calf tenderness, SCDs in place  Labs:    Latest Ref Rng & Units 04/15/2022    3:38 AM 04/14/2022    6:03 AM 04/13/2022    6:04 AM  CBC  WBC 4.0 - 10.5 K/uL 11.6  12.9  15.0   Hemoglobin 12.0 - 15.0 g/dL 8.4  8.5  8.6   Hematocrit 36.0 - 46.0 % 26.5  27.1  27.2   Platelets 150 - 400 K/uL 350  353  319       Latest Ref Rng & Units 04/15/2022    3:38 AM 04/14/2022    6:03 AM 04/13/2022    6:04 AM  BMP  Glucose 70 - 99 mg/dL 111  96  109   BUN 8 - 23 mg/dL '9  11  15   '$ Creatinine 0.44 - 1.00 mg/dL 0.72  0.85  1.06   Sodium 135 - 145 mmol/L 141  139  138   Potassium 3.5 - 5.1 mmol/L 3.9  3.5  4.1   Chloride 98 - 111 mmol/L 108  106  109   CO2 22 - 32  mmol/L '26  25  24   '$ Calcium 8.9 - 10.3 mg/dL 9.2  9.0  9.0     Assessment: 71 y.o. s/p Procedure(s): LAPAROSCOPY DIAGNOSTIC HYSTERECTOMY ABDOMINAL BILATERAL SALPINGO OOPHORECTOMY WITH OMENTECTOMY ,DEBULKING: meeting postoperative milestones   Pain:  Well controlled. Continue oral regimen.   Heme: Acute on chronic anemia. Hgb stable after transfusion on 6/16.  Asymptomatic when ambulating.    ID: leukocytosis, decreased from yesterday. Patient without symptoms suspicious for infection. Remains afebrile. UA negative for evidence of UTI and CXR negative.    CV: New onset a fib - appreciate medicine's and cardiology's assistance. ECHO performed 5/16. Continue holding on tx dosing anticoagulation given recent surgery, continue prophylactic lovenox and SCDs.   GI:  Tolerating PO. Continue diabetic diet. Has had return of bowel function.   FEN: Nutritional supplements ordered.    Endo: Good glycemic control, continue current regimen. CBG (last 3)  Recent Labs    04/14/22 2152 04/15/22 0736 04/15/22 1118  GLUCAP 109* 111* 100*      Prophylaxis: lovenox, SCDs.   Plan: Plan for discharge later today To resume home health PT [facility] to deliver xarelto Follow up with cardiology about home meds, norvasc and losartan The patient is to be discharged to home  LOS: 6 days    Dorothyann Gibbs 04/15/2022, 8:13 AM

## 2022-04-15 NOTE — Progress Notes (Signed)
Pt discharged home today per [name]. Pt's port site deaccessed and WDL. Pt's VSS. Pt provided with home medication list, discharge instructions and prescriptions. Verbalized understanding. Pt left floor via WC in stable condition accompanied by RN.

## 2022-04-15 NOTE — Telephone Encounter (Signed)
Received call from [facility] at Dr. Lysbeth Penner office. Patient has not been seen outpatient with cardiology and Dr. Debara Pickett is not in office. Anderson Malta recommends contacting the inpatient cardiology team for clarification.

## 2022-04-16 ENCOUNTER — Telehealth: Payer: Self-pay

## 2022-04-16 ENCOUNTER — Inpatient Hospital Stay: Payer: Medicare HMO | Admitting: Gynecologic Oncology

## 2022-04-16 ENCOUNTER — Other Ambulatory Visit: Payer: Self-pay | Admitting: Hematology and Oncology

## 2022-04-16 NOTE — Telephone Encounter (Signed)
Transition Care Management Unsuccessful Follow-up Telephone Call  Date of discharge and from where:  [facility] 04/09/22-04/15/22  Attempts:  1st Attempt  Reason for unsuccessful TCM follow-up call:  Unable to leave message- VM not set up.  [name], RN, BSN, CCM Care Management Coordinator [facility] Phone: [phone]: [phone]

## 2022-04-16 NOTE — Telephone Encounter (Signed)
Transition Care Management Follow-up Telephone Call Date of discharge and from where: [facility] 04/09/22-04/15/22 How have you been since you were released from the hospital? "I am doing ok, the pain seems under control." Any questions or concerns? No  Items Reviewed: Did the pt receive and understand the discharge instructions provided? Yes  Medications obtained and verified? Yes  Other? No  Any new allergies since your discharge? No  Dietary orders reviewed? Yes Do you have support at home? Yes -Patient has many friends assisting her with food along with her son.   Home Care and Equipment/Supplies: Were home health services ordered? yes If so, what is the name of the agency? [facility]  Has the agency set up a time to come to the patient's home? No- patient is reestablishing care so there should not be a delay. Were any new equipment or medical supplies ordered?  No What is the name of the medical supply agency? N/A Were you able to get the supplies/equipment? not applicable Do you have any questions related to the use of the equipment or supplies? No  Functional Questionnaire: (I = Independent and D = Dependent) ADLs: I  Bathing/Dressing- I  Meal Prep- I- Family/friends assisting.  Eating- I  Maintaining continence- I  Transferring/Ambulation- I  Managing Meds- I  Follow up appointments reviewed:  PCP Hospital f/u appt confirmed? No   Specialist Hospital f/u appt confirmed? Yes  Scheduled to see [name] on 05/06/22 @ 2:15. Are transportation arrangements needed? No  If their condition worsens, is the pt aware to call PCP or go to the Emergency Dept.? Yes Was the patient provided with contact information for the PCP's office or ED? Yes Was to pt encouraged to call back with questions or concerns? Yes [name], RN, BSN, CCM Care Management Coordinator [facility] Internal Medicine Phone: [phone]: [phone]

## 2022-04-16 NOTE — Telephone Encounter (Signed)
Spoke with Sherri [facility] this morning. She states she is eating, drinking and urinating well. She has had a BM since going home. She denies fever or chills. Incisions are dry and intact. She rates her pain 2/10. Her pain is controlled with tylenol and tramadol. Reviewed monitoring tylenol intake to not exceed 4,'000mg'$  in a 24 hour period, using ibuprofen sparingly, taking xarelto at the same time each day and monitoring for signs of bleeding.   Instructed to call office with any fever, chills, purulent drainage, uncontrolled pain or any other questions or concerns. Patient verbalizes understanding.   Pt aware of post op appointments as well as the office number [phone] and after hours number [phone] to call if she has any questions or concerns

## 2022-04-18 ENCOUNTER — Institutional Professional Consult (permissible substitution): Payer: Medicare HMO | Admitting: Behavioral Health

## 2022-04-18 ENCOUNTER — Ambulatory Visit: Payer: Self-pay

## 2022-04-18 LAB — CULTURE, BLOOD (ROUTINE X 2)
Culture: NO GROWTH
Culture: NO GROWTH
Special Requests: ADEQUATE
Special Requests: ADEQUATE

## 2022-04-19 ENCOUNTER — Telehealth: Payer: Self-pay | Admitting: Hematology and Oncology

## 2022-04-22 ENCOUNTER — Other Ambulatory Visit: Payer: Self-pay | Admitting: Oncology

## 2022-04-23 ENCOUNTER — Other Ambulatory Visit: Payer: Self-pay | Admitting: Gynecologic Oncology

## 2022-04-23 ENCOUNTER — Other Ambulatory Visit (HOSPITAL_COMMUNITY): Payer: Self-pay

## 2022-04-23 ENCOUNTER — Telehealth: Payer: Self-pay | Admitting: Internal Medicine

## 2022-04-23 ENCOUNTER — Ambulatory Visit: Payer: Self-pay

## 2022-04-23 ENCOUNTER — Telehealth: Payer: Self-pay

## 2022-04-23 DIAGNOSIS — C55 Malignant neoplasm of uterus, part unspecified: Secondary | ICD-10-CM

## 2022-04-23 NOTE — Chronic Care Management (AMB) (Addendum)
   04/23/2022  Sherri Gill 09-14-1951 [id number]  Incoming call from patient.  She is worried that her glaucoma eyedrops (Lanaprost and Cosopt) are running out and she can't get into her eye doctor soon along with her recent surgery (inpatient hysterectomy for cancer).  Patient thinks there is a refill at [facility] pharmacy but she is not able to drive that far yet.  Explained to patient [facility] should be able to transfer prescription to [facility].  If they are unable, she will call this RNCM back and I will request refill through clinic physicians. Jodelle Gross, RN, BSN, CCM Care Management Coordinator [facility] Internal Medicine Phone: [phone]/Fax: [phone]

## 2022-04-24 ENCOUNTER — Ambulatory Visit: Payer: Self-pay

## 2022-04-24 ENCOUNTER — Other Ambulatory Visit (HOSPITAL_COMMUNITY): Payer: Self-pay

## 2022-04-24 NOTE — Chronic Care Management (AMB) (Signed)
   04/24/2022  Sherri Gill July 16, 1951 [id number]  Incoming call from patient with concerns her ankles are swelling.  Patient looked on line and read many poor reviews on the cardiologist she was referred to and she is wondering if she can see another cardiologist.  Patient was seen by Candee Furbish, MD from Cardiology along with Dr. Debara Pickett.  She would prefer to see Dr. Marlou Porch but is scheduled with Dr. Debara Pickett.  Patient is also concerned because she has developed swelling in her ankles.  Provided information on Dr. Marlou Porch and advised patient to contact [facility] scheduling desk to make appointment to be seen to discuss her medication. [name], RN, BSN, CCM Care Management Coordinator [facility] Internal Medicine Phone: [phone]: [phone]

## 2022-04-24 NOTE — Telephone Encounter (Signed)
Mailbox not set up. Unable to leave message.

## 2022-04-24 NOTE — Telephone Encounter (Signed)
Informed patient of C. Walker's notes... "She had atrial fibrillation during admission as well anemia.  Both could contribute to lower extremity edema.  Low suspicion her medications are contributory.  Recommend compression socks, elevating legs when sitting, low-sodium diet.  Please ensure she is taking blood pressure at least 2 hours after medications.  Follow-up as scheduled Friday.  Please bring blood pressure cuff to visit to ensure accuracy.  Could schedule sooner follow-up Thursday if appointment available with APP if she wishes.  May require additional antihypertensive agent at that time.  We will recheck blood work at that clinic visit to ensure her anemia is improving-she does not need to fast." No new questions or concerns.

## 2022-04-24 NOTE — Telephone Encounter (Signed)
Patient stated she did not have ankle edema before her surgery  (HYSTERECTOMY ABDOMINAL BILATERAL SALPINGO OOPHORECTOMY WITH OMENTECTOMY, DEBULKING (Bilateral). During hospital stay she was dx with afib. Patient is now reporting pitting edema in ankles. She denies chest pain or sob. Her urine is light yellow. BP rt arm 164/78, P 71, left arm 163/88, P 76. She watches sodium intake. Her oncologist took her off hctz on 3/31. She was taken off amlodipine on d/c. Patient has appointment with Vella Raring, NP this Friday. Please advise on bp and edema.

## 2022-04-25 ENCOUNTER — Telehealth: Payer: Self-pay | Admitting: Behavioral Health

## 2022-04-25 ENCOUNTER — Ambulatory Visit: Payer: Medicare HMO | Admitting: Behavioral Health

## 2022-04-25 NOTE — Telephone Encounter (Signed)
Three unsuccessful attempts to reach Pt today, as appt was scheduled late & she did not receive a reminder. Scheduled Pt for a 3:00pm appt time hoping to avoid her other appt @ 1:55pm. Pt unable to ans call today @ 3:00pm. Pt aware of Clinician transition.  Dr. Theodis Shove

## 2022-04-25 NOTE — Progress Notes (Signed)
Office Visit    Patient Name: Sherri Gill Date of Encounter: 04/26/2022  Primary Care Provider:  [name], MD Primary Cardiologist:  Pixie Casino, MD Primary Electrophysiologist: None  Chief Complaint    Sherri Gill is a 71 y.o. female with PMH of new onset atrial fibrillation RVR, dyslipidemia, GERD, HTN, DM type II, peripheral neuropathy, IDA, endometrial cancer s/p hysterectomy who presents today for post hospital follow-up  Past Medical History    Past Medical History:  Diagnosis Date   AKI (acute kidney injury) ([place])    History of   Ascites    09/2021 paracentesis with almost 3 L of fluid 12/2021 Paracentesis performed with 2.8 L   Chronic anemia 08/29/2012   Need colonoscopy or report. Need iron panel.    Dyslipidemia 06/30/2007   Dyspnea    Essential hypertension, benign 06/30/2007   GERD 06/30/2007   History of blood transfusion    History of fatty infiltration of liver    History of migraine    Hx of Herpes simplex meningitis 2015   Also noted to have primary empty sella on imaging at this admission   Insomnia 06/11/2012   Major depressive disorder, recurrent episode, moderate with anxious distress ([place]) 06/30/2007   Nausea and vomiting 01/13/2022   Obesity, BMI 35-40 06/11/2012   Peripheral neuropathy 2/2 T2DM 12/28/2009   Primary empty sella syndrome ([place]) 2015   Noted on imaging during hospitalization for herpes meningitis; no pituitary mass, no hormone w/u at that time, hormonally asymptomatic   Type 2 diabetes mellitus with neurological complications ([place]) 30/06/2329   Past Surgical History:  Procedure Laterality Date   COLONOSCOPY W/ POLYPECTOMY  06/28/2004   DENTAL SURGERY     DILATATION & CURETTAGE/HYSTEROSCOPY WITH MYOSURE N/A 01/15/2022   Procedure: DILATATION & CURETTAGE/HYSTEROSCOPY WITH MYOSURE;  Surgeon: Lafonda Mosses, MD;  Location: [facility];  Service: Gynecology;  Laterality: N/A;  DO NOT OPEN HYSTEROSCOPY KIT    HYSTERECTOMY ABDOMINAL WITH SALPINGO-OOPHORECTOMY Bilateral 04/09/2022   Procedure: HYSTERECTOMY ABDOMINAL BILATERAL SALPINGO OOPHORECTOMY WITH OMENTECTOMY ,DEBULKING;  Surgeon: Lafonda Mosses, MD;  Location: [facility];  Service: Gynecology;  Laterality: Bilateral;   IR IMAGING GUIDED PORT INSERTION  01/18/2022   LAPAROSCOPY N/A 04/09/2022   Procedure: LAPAROSCOPY DIAGNOSTIC;  Surgeon: Lafonda Mosses, MD;  Location: [facility];  Service: Gynecology;  Laterality: N/A;   OPERATIVE ULTRASOUND N/A 01/15/2022   Procedure: OPERATIVE ULTRASOUND;  Surgeon: Lafonda Mosses, MD;  Location: [facility];  Service: Gynecology;  Laterality: N/A;   port a cath placement     TUBAL LIGATION     age 25    Allergies  Allergies  Allergen Reactions   Codeine Sulfate Nausea And Vomiting and Other (See Comments)    Dizziness   Penicillins Nausea And Vomiting   Percocet [Oxycodone-Acetaminophen] Nausea And Vomiting   Zestril [Lisinopril] Cough    History of Present Illness    Sherri Gill is a 71 year old female with the above-mentioned past medical history who presents today for post hospital follow-up of new onset A-fib with RVR.  Sherri Gill was  hospitalized on 01/24/2022 for complaint of abdominal pain that began after Christmas last year.  She was found to have an umbilical hernia and was referred to the ED by her surgeon for ascites.  She was admitted and had paracentesis with 2. 8 L of fluid removed that revealed malignant cells.  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  She underwent hysterectomy and bilateral salpingo-oophorectomy and developed new onset AF.  She was treated with p.o. Cardizem 30 mg every 6 hours and 2D echo was performed that revealed EF of 60-65%, and trivial posterior pericardial effusion with normal biatrial size.  CHA2DS2-VASc score is elevated at 5 and AC was not initiated due to recent surgery.  She was  transfused for blood loss anemia and converted to sinus rhythm during stay.  She was placed on Xarelto for postoperative DVT prophylaxis and rate control with Cardizem 120 mg daily and Norvasc for BP control.  Since last being seen in the office patient reports that since her discharge from hospital she has noticed an increase in her feet swelling.  She states that she has been on her feet more than usual and had recently ate [place] food and [place] for lunch. She reports also that last night after elevating her feet they reduced in size however today the swelling returned later that day.  Her blood pressure today was 136/70 and most recent EKG was normal.  Today she denies chest pain, palpitations, dyspnea, PND, orthopnea, nausea, vomiting, dizziness, syncope, edema, weight gain, or early satiety.  Home Medications    Current Outpatient Medications  Medication Sig Dispense Refill   acetaminophen (TYLENOL) 500 MG tablet Take 1,000 mg by mouth every 6 (six) hours as needed for mild pain.     albuterol (VENTOLIN HFA) 108 (90 Base) MCG/ACT inhaler Inhale 2 puffs into the lungs every 4 (four) hours as needed for wheezing or shortness of breath. 8 g 0   atorvastatin (LIPITOR) 20 MG tablet Take 1 tablet (20 mg total) by mouth at bedtime. 30 tablet 11   B Complex-C (B-COMPLEX WITH VITAMIN C) tablet Take 1 tablet by mouth in the morning.     Blood Glucose Monitoring Suppl (ONE TOUCH ULTRA MINI) w/Device KIT Please use as directed. 1 each 0   Cholecalciferol 1000 units capsule Take 1 capsule (1,000 Units total) by mouth daily. 90 capsule 1   dexamethasone (DECADRON) 4 MG tablet Take 2 tablets by mouth the night before and 2 tablets the morning of chemotherapy, every 3 weeks for 6 cycles 36 tablet 6   diltiazem (CARDIZEM CD) 120 MG 24 hr capsule Take 1 capsule (120 mg total) by mouth daily. 30 capsule 2   dorzolamide-timolol (COSOPT) 22.3-6.8 MG/ML ophthalmic solution Place 1 drop into both eyes 2 (two)  times daily.     glucose blood (ONE TOUCH ULTRA TEST) test strip Use as instructed 100 each 1   Insulin Pen Needle 32G X 4 MM MISC 1 each by Does not apply route daily. 100 each 3   latanoprost (XALATAN) 0.005 % ophthalmic solution Place 1 drop into both eyes at bedtime.     lidocaine-prilocaine (EMLA) cream Apply to affected area once 30 g 3   liraglutide (VICTOZA) 18 MG/3ML SOPN PLEASE START TAKING VICTOZA 0.6MG DAILY WEEK 1, THEN TAKE 1.2MG DAILY WEEK 2, AND THEN TAKE 1.8MG DAILY THEREAFTER (Patient taking differently: Inject 1.2 mg into the skin daily.) 9 mL 3   losartan (COZAAR) 100 MG tablet Take 100 mg by mouth at bedtime.     magnesium oxide (MAG-OX) 400 (240 Mg) MG tablet Take 1 tablet by mouth 2 times daily. 60 tablet 1   metFORMIN (GLUCOPHAGE) 1000 MG tablet Take 1,000 mg by mouth 2 (two) times daily.     ondansetron (ZOFRAN) 8 MG tablet Take 1 tablet (8 mg  total) by mouth 2 (two) times daily as needed. Start on the third day after chemotherapy. 30 tablet 1   ONETOUCH DELICA LANCETS 35K MISC Please use as directed. 100 each 0   pantoprazole (PROTONIX) 40 MG tablet Take 1 tablet by mouth daily. 30 tablet 1   polyethylene glycol powder (GLYCOLAX/MIRALAX) 17 GM/SCOOP powder Take 17 g by mouth daily. 238 g 1   prochlorperazine (COMPAZINE) 10 MG tablet Take 1 tablet (10 mg total) by mouth every 6 (six) hours as needed (Nausea or vomiting). 30 tablet 1   rivaroxaban (XARELTO) 10 MG TABS tablet Take 1 tablet (10 mg total) by mouth daily. Start the day after discharge from hospital 24 tablet 0   senna-docusate (SENOKOT-S) 8.6-50 MG tablet Take 2 tablets by mouth at bedtime. For AFTER surgery, do not take if having diarrhea 30 tablet 0   traMADol (ULTRAM) 50 MG tablet Take 1 tablet (50 mg total) by mouth every 6 (six) hours as needed for severe pain. For AFTER surgery only 10 tablet 0   No current facility-administered medications for this visit.     Review of Systems  Please see the history  of present illness.    (+) Swelling in lower extremities (+) Fatigue  All other systems reviewed and are otherwise negative except as noted above.  Physical Exam    Wt Readings from Last 3 Encounters:  04/26/22 176 lb (79.8 kg)  04/12/22 187 lb 12.8 oz (85.2 kg)  04/05/22 176 lb (79.8 kg)   VS: Vitals:   04/26/22 1448  BP: 136/70  Pulse: 74  ,Body mass index is 31.18 kg/m.  Constitutional:      Appearance: Healthy appearance. Not in distress.  Neck:     Vascular: JVD normal.  Pulmonary:     Effort: Pulmonary effort is normal.     Breath sounds: No wheezing. No rales. Diminished in the bases Cardiovascular:     Normal rate. Regular rhythm. Normal S1. Normal S2.      Murmurs: There is no murmur.  Edema:    Peripheral edema absent.  Abdominal:     Palpations: Abdomen is soft non tender. There is no hepatomegaly.  Skin:    General: Skin is warm and dry.  Neurological:     General: No focal deficit present.     Mental Status: Alert and oriented to person, place and time.     Cranial Nerves: Cranial nerves are intact.  EKG/LABS/Other Studies Reviewed    ECG personally reviewed by me today -none ordered today  Risk Assessment/Calculations:    CHA2DS2-VASc Score = 4   This indicates a 4.8% annual risk of stroke. The patient's score is based upon: CHF History: 0 HTN History: 1 Diabetes History: 1 Stroke History: 0 Vascular Disease History: 0 Age Score: 1 Gender Score: 1     Lab Results  Component Value Date   WBC 11.6 (H) 04/15/2022   HGB 8.4 (L) 04/15/2022   HCT 26.5 (L) 04/15/2022   MCV 86.3 04/15/2022   PLT 350 04/15/2022   Lab Results  Component Value Date   CREATININE 0.72 04/15/2022   BUN 9 04/15/2022   NA 141 04/15/2022   K 3.9 04/15/2022   CL 108 04/15/2022   CO2 26 04/15/2022   Lab Results  Component Value Date   ALT 12 04/15/2022   AST 12 (L) 04/15/2022   GGT 24 09/25/2016   ALKPHOS 149 (H) 04/15/2022   BILITOT 0.5 04/15/2022   Lab  Results  Component Value Date   CHOL 171 05/31/2021   HDL 65 05/31/2021   LDLCALC 79 05/31/2021   TRIG 161 (H) 05/31/2021   CHOLHDL 2.6 05/31/2021    Lab Results  Component Value Date   HGBA1C 6.5 (H) 04/05/2022    Assessment & Plan    1.  Paroxysmal atrial fibrillation: -Patient remains in sinus rhythm and is tolerating current rate control with Cardizem 120 mg daily.  Patient inquired briefly about the need for 30-day monitoring if not in atrial fibrillation.  I advised that the monitor will allow for definitive evaluation of possible atrial fibrillation burden.  2.  Essential hypertension: -Blood pressure today was 136/70.  Patient states that over the last few days she has had multiple dietary indiscretions with sodium.  We discussed the importance of abstaining from excess salt in her diet and the association of edema and sodium. -Continue Cozaar 100 mg daily  3.  Dyslipidemia: -Continue Lipitor 20 mg daily per PCP  4.  Uterine cancer: -Patient is s/p hysterectomy and is currently being followed by oncology. -We discussed the possibility that swelling may be related to recent surgery and I encouraged her to discuss further with oncologist at upcoming appointment.  5.  Lower extremity edema: -Patient will abstain from excess sodium in her diet as noted above. -She was encouraged to purchase TED hose and also elevate her extremities when dependent -I advised her to contact the office if she notices that her swelling is staying the same or is increasing in size. -We may try as needed Lasix if swelling remains consistent despite dietary changes and TED hose  Disposition: Follow-up with Pixie Casino, MD or APP in 2 months   Medication Adjustments/Labs and Tests Ordered: Current medicines are reviewed at length with the patient today.  Concerns regarding medicines are outlined above.   Signed, Mable Fill, Marissa Nestle, NP 04/26/2022, 4:38 PM [place]

## 2022-04-26 ENCOUNTER — Ambulatory Visit (HOSPITAL_BASED_OUTPATIENT_CLINIC_OR_DEPARTMENT_OTHER): Payer: Medicare HMO | Admitting: Nurse Practitioner

## 2022-04-26 ENCOUNTER — Encounter (HOSPITAL_BASED_OUTPATIENT_CLINIC_OR_DEPARTMENT_OTHER): Payer: Self-pay | Admitting: Nurse Practitioner

## 2022-04-26 ENCOUNTER — Ambulatory Visit (INDEPENDENT_AMBULATORY_CARE_PROVIDER_SITE_OTHER): Payer: Medicare HMO

## 2022-04-26 VITALS — BP 136/70 | HR 74 | Ht 63.0 in | Wt 176.0 lb

## 2022-04-26 DIAGNOSIS — E1169 Type 2 diabetes mellitus with other specified complication: Secondary | ICD-10-CM

## 2022-04-26 DIAGNOSIS — D508 Other iron deficiency anemias: Secondary | ICD-10-CM | POA: Diagnosis not present

## 2022-04-26 DIAGNOSIS — I4891 Unspecified atrial fibrillation: Secondary | ICD-10-CM

## 2022-04-26 DIAGNOSIS — C55 Malignant neoplasm of uterus, part unspecified: Secondary | ICD-10-CM

## 2022-04-26 DIAGNOSIS — E785 Hyperlipidemia, unspecified: Secondary | ICD-10-CM | POA: Diagnosis not present

## 2022-04-26 DIAGNOSIS — I10 Essential (primary) hypertension: Secondary | ICD-10-CM | POA: Diagnosis not present

## 2022-04-26 NOTE — Patient Instructions (Addendum)
Medication Instructions:  Continue your current medications.   *If you need a refill on your cardiac medications before your next appointment, please call your pharmacy*   Lab Work: None ordered today.    Testing/Procedures: Your 30 day monitor was placed in clinic today.   Follow-Up: At [facility], you and your health needs are our priority.  As part of our continuing mission to provide you with exceptional heart care, we have created designated Provider Care Teams.  These Care Teams include your primary Cardiologist (physician) and Advanced Practice Providers (APPs -  Physician Assistants and Nurse Practitioners) who all work together to provide you with the care you need, when you need it.  We recommend signing up for the patient portal called "MyChart".  Sign up information is provided on this After Visit Summary.  MyChart is used to connect with patients for Virtual Visits (Telemedicine).  Patients are able to view lab/test results, encounter notes, upcoming appointments, etc.  Non-urgent messages can be sent to your provider as well.   To learn more about what you can do with MyChart, go to [email or web address].    Your next appointment:   Follow up as scheduled with Coletta Memos, NP   Other Instructions  To prevent or reduce lower extremity swelling: Eat a low salt diet. Salt makes the body hold onto extra fluid which causes swelling. Sit with legs elevated. For example, in the recliner or on an [place].  Wear knee-high compression stockings during the daytime. Ones labeled 15-20 mmHg provide good compression.  You can get them at [facility] supply, online on [employer], or at the company in [place] Sodium, which is an element that makes up salt, helps you maintain a healthy balance of fluids in your body. Too much sodium can increase your blood pressure and cause fluid and waste to be held in your body. Your health care provider or dietitian may  recommend following this plan if you have high blood pressure (hypertension), kidney disease, liver disease, or heart failure. Eating less sodium can help lower your blood pressure, reduce swelling, and protect your heart, liver, and kidneys. What are tips for following this plan? Reading food labels The Nutrition Facts label lists the amount of sodium in one serving of the food. If you eat more than one serving, you must multiply the listed amount of sodium by the number of servings. Choose foods with less than 140 mg of sodium per serving. Avoid foods with 300 mg of sodium or more per serving. Shopping  Look for lower-sodium products, often labeled as "low-sodium" or "no salt added." Always check the sodium content, even if foods are labeled as "unsalted" or "no salt added." Buy fresh foods. Avoid canned foods and pre-made or frozen meals. Avoid canned, cured, or processed meats. Buy breads that have less than 80 mg of sodium per slice. Cooking Eat more home-cooked food and less restaurant, buffet, and fast food. Avoid adding salt when cooking. Use salt-free seasonings or herbs instead of table salt or sea salt. Check with your health care provider or pharmacist before using salt substitutes. Cook with plant-based oils, such as canola, sunflower, or olive oil. Meal planning When eating at a restaurant, ask that your food be prepared with less salt or no salt, if possible. Avoid dishes labeled as brined, pickled, cured, smoked, or made with soy sauce, miso, or teriyaki sauce. Avoid foods that contain MSG (monosodium glutamate). MSG is sometimes added to [place] food, bouillon, and  some canned foods. Make meals that can be grilled, baked, poached, roasted, or steamed. These are generally made with less sodium. General information Most people on this plan should limit their sodium intake to 1,500-2,000 mg (milligrams) of sodium each day. What foods should I eat? Fruits Fresh, frozen, or  canned fruit. Fruit juice. Vegetables Fresh or frozen vegetables. "No salt added" canned vegetables. "No salt added" tomato sauce and paste. Low-sodium or reduced-sodium tomato and vegetable juice. Grains Low-sodium cereals, including oats, puffed wheat and rice, and shredded wheat. Low-sodium crackers. Unsalted rice. Unsalted pasta. Low-sodium bread. Whole-grain breads and whole-grain pasta. Meats and other proteins Fresh or frozen (no salt added) meat, poultry, seafood, and fish. Low-sodium canned tuna and salmon. Unsalted nuts. Dried peas, beans, and lentils without added salt. Unsalted canned beans. Eggs. Unsalted nut butters. Dairy Milk. Soy milk. Cheese that is naturally low in sodium, such as ricotta cheese, fresh mozzarella, or Swiss cheese. Low-sodium or reduced-sodium cheese. Cream cheese. Yogurt. Seasonings and condiments Fresh and dried herbs and spices. Salt-free seasonings. Low-sodium mustard and ketchup. Sodium-free salad dressing. Sodium-free light mayonnaise. Fresh or refrigerated horseradish. Lemon juice. Vinegar. Other foods Homemade, reduced-sodium, or low-sodium soups. Unsalted popcorn and pretzels. Low-salt or salt-free chips. The items listed above may not be a complete list of foods and beverages you can eat. Contact a dietitian for more information. What foods should I avoid? Vegetables Sauerkraut, pickled vegetables, and relishes. Olives. [place] fries. Onion rings. Regular canned vegetables (not low-sodium or reduced-sodium). Regular canned tomato sauce and paste (not low-sodium or reduced-sodium). Regular tomato and vegetable juice (not low-sodium or reduced-sodium). Frozen vegetables in sauces. Grains Instant hot cereals. Bread stuffing, pancake, and biscuit mixes. Croutons. Seasoned rice or pasta mixes. Noodle soup cups. Boxed or frozen macaroni and cheese. Regular salted crackers. Self-rising flour. Meats and other proteins Meat or fish that is salted, canned,  smoked, spiced, or pickled. Precooked or cured meat, such as sausages or meat loaves. [place] Salines. Ham. Pepperoni. Hot dogs. Corned beef. Chipped beef. Salt pork. Jerky. Pickled herring. Anchovies and sardines. Regular canned tuna. Salted nuts. Dairy Processed cheese and cheese spreads. Hard cheeses. Cheese curds. Blue cheese. Feta cheese. String cheese. Regular cottage cheese. Buttermilk. Canned milk. Fats and oils Salted butter. Regular margarine. Ghee. Bacon fat. Seasonings and condiments Onion salt, garlic salt, seasoned salt, table salt, and sea salt. Canned and packaged gravies. Worcestershire sauce. Tartar sauce. Barbecue sauce. Teriyaki sauce. Soy sauce, including reduced-sodium. Steak sauce. Fish sauce. Oyster sauce. Cocktail sauce. Horseradish that you find on the shelf. Regular ketchup and mustard. Meat flavorings and tenderizers. Bouillon cubes. Hot sauce. Pre-made or packaged marinades. Pre-made or packaged taco seasonings. Relishes. Regular salad dressings. Salsa. Other foods Salted popcorn and pretzels. Corn chips and puffs. Potato and tortilla chips. Canned or dried soups. Pizza. Frozen entrees and pot pies. The items listed above may not be a complete list of foods and beverages you should avoid. Contact a dietitian for more information. Summary Eating less sodium can help lower your blood pressure, reduce swelling, and protect your heart, liver, and kidneys. Most people on this plan should limit their sodium intake to 1,500-2,000 mg (milligrams) of sodium each day. Canned, boxed, and frozen foods are high in sodium. Restaurant foods, fast foods, and pizza are also very high in sodium. You also get sodium by adding salt to food. Try to cook at home, eat more fresh fruits and vegetables, and eat less fast food and canned, processed, or prepared foods. This information is  not intended to replace advice given to you by your health care provider. Make sure you discuss any questions you have  with your health care provider. Document Revised: 11/19/2019 Document Reviewed: 09/15/2019 Elsevier Patient Education  [place].

## 2022-04-29 ENCOUNTER — Telehealth: Payer: Self-pay

## 2022-04-29 ENCOUNTER — Other Ambulatory Visit: Payer: Self-pay | Admitting: Gynecologic Oncology

## 2022-04-29 ENCOUNTER — Other Ambulatory Visit (HOSPITAL_COMMUNITY): Payer: Self-pay

## 2022-04-29 ENCOUNTER — Telehealth: Payer: Self-pay | Admitting: Cardiology

## 2022-04-29 DIAGNOSIS — G8918 Other acute postprocedural pain: Secondary | ICD-10-CM

## 2022-04-29 DIAGNOSIS — C55 Malignant neoplasm of uterus, part unspecified: Secondary | ICD-10-CM

## 2022-04-29 MED ORDER — TRAMADOL HCL 50 MG PO TABS
50.0000 mg | ORAL_TABLET | Freq: Four times a day (QID) | ORAL | 0 refills | Status: DC | PRN
  Filled 2022-04-29: qty 10, 3d supply, fill #0

## 2022-04-29 MED ORDER — TRAMADOL HCL 50 MG PO TABS: 50.0000 mg | ORAL_TABLET | Freq: Four times a day (QID) | ORAL | 0 refills | Status: DC | PRN

## 2022-04-29 NOTE — Telephone Encounter (Signed)
Sherri Gill, CMA received triage call from patient. Patient reports she is having bleeding and pain. Pain is around her incisions and mid stomach. She has been taking tylenol and rates her pain 7/10. Bleeding started Thursday or Friday. Bleeding is bright red at times and nickle-dime size in amount. Patient denies heavy lifting, inserting anything into her vagina, fever or chills.   Called and spoke with patient. She reports after taking tylenol and tramadol her pain is now a 3/10. Pain is located in her lower pelvic area and has been constant today. She reports going to the grocery store and doing some cooking and not feeling well after this. She reports the top of her incision is like a "ball" but is not painful. Incision is warm, dry and intact. She denies any drainage or redness. She is having regular bowel movements and taking her miralax daily. She denies any urinary symptoms. Offered patient an office visit today but patient is unable to get here.[name] notified.   Per [name] patient needs to use her abdominal binder, wear supportive clothing, rest and apply a heating pad or ice pack.Tramadol has been sent into pharmacy. Patient verbalized understanding.  Appointment scheduled with [name], NP on 05/01/22 at 2:00pm.  Instructed patient to monitor bleeding, if bleeding becomes heavy or pain increases to notify our office. Patient verbalized understanding and has both the office number as well as the after hours number.

## 2022-04-29 NOTE — Telephone Encounter (Signed)
Monitor company called today with atrial fib with rate up to 170.  I called pt and she is on her xarelto, no awareness of rapid HR, she had been out at time of atrial fib.  No chest pain, no lightheadedness.    Her BP now is 153/73  and HR is 88 so most likely back in SR.  Though she says her HR jumped up  but she does not remember her dilt today so she will take now.  If she begins to feel bad she will call back or go to Er.

## 2022-05-01 ENCOUNTER — Telehealth: Payer: Self-pay | Admitting: Internal Medicine

## 2022-05-01 ENCOUNTER — Telehealth: Payer: Self-pay

## 2022-05-01 ENCOUNTER — Other Ambulatory Visit (HOSPITAL_COMMUNITY): Payer: Self-pay

## 2022-05-01 ENCOUNTER — Other Ambulatory Visit: Payer: Self-pay

## 2022-05-01 ENCOUNTER — Inpatient Hospital Stay: Payer: Medicare HMO | Admitting: Gynecologic Oncology

## 2022-05-01 MED ORDER — RIVAROXABAN 20 MG PO TABS
20.0000 mg | ORAL_TABLET | Freq: Every day | ORAL | 1 refills | Status: DC
  Filled 2022-05-01 – 2022-05-03 (×2): qty 30, 30d supply, fill #0
  Filled 2022-06-06: qty 30, 30d supply, fill #1

## 2022-05-01 NOTE — Telephone Encounter (Signed)
Pt called following up about a call about her heart monitor. She states that she was told not to drink caffeine, however she drank a sundrop with caffeine and recorded her bp 153/73 HR 103. Pt states that she saw Ambrose Pancoast, NP and another MD but cannot remember her name. She states that somebody came an saw her at the end of her appt on 6/30. She states that she would like to be seen in [place].

## 2022-05-01 NOTE — Telephone Encounter (Signed)
Following up with Sherri Gill. Patient reports she is under a lot of stress with having to go to so many doctors. She was notified on Monday that she was in AFIB but she did not have any symptooms. Patient has questions on how to take her cardizem since she was instructed to take it differently when she called the after hours triage. Advised patient to follow up with cardiology, patient verbalized understanding.   Patient reports her vaginal bleeding has improved and she now has light brown spotting. She rates her pain 3/10 and is taking tylenol only. She has been using her abdominal binder and trying to rest. Reinforced no pushing, pulling, straining or lifting over 10 pounds. Patient states she would like to cancel her appointment for today since the bleeding has slowed and she has not needed to take a tramadol. [name], NP notified. Patient to follow up in office on 05/06/22 with [name] at 2:15. Advised patient to call with any worsening symptoms, questions or concerns. Patient verbalized understanding.

## 2022-05-01 NOTE — Telephone Encounter (Signed)
Patient returning call.

## 2022-05-01 NOTE — Telephone Encounter (Signed)
Called pt in regards to heart monitor.  Advised pt that drinking a Sundrop X1 is not of concern.  Pt is wearing a 30 day heart monitor will be able to obtain more data.  Advised pt that HR of 103 is not of concern.  If HR sustains 120 and above to call into the office.  Advised pt to take Xarelto and diltiazem as ordered pt reports taking as ordered.  Pt would like to be seen at [facility] location advised pt next OV is at this location on 06/19/22 with Laurann Montana, NP.  Pt also advised to contact [employer] d/t monitor coming up; may need additional adhesive.  No further complaints voiced.

## 2022-05-01 NOTE — Telephone Encounter (Signed)
Called her regarding after hours call 7/4 regarding heart monitor. She called after hours reading heart monitor wanting cardiology #. Given cardiology phone #, she will call them.  Sent message GYN office. She is requesting a call.

## 2022-05-01 NOTE — Telephone Encounter (Signed)
Received in triage the [employer] printout from 04/29/22. Dr. Debara Pickett reviewed the [employer] monitor results and ordered xarelto from '10mg'$  to '20mg'$  daily. Patient to keep her gyn appointment for 05/06/22 and to call gyn if vaginal bleeding increases. Patient informed of purpose for xarelto dosage change and to monitor for bleeding changes. New prescription sent to patient's preferred pharmacy. Patient will complete her '10mg'$  tablets (2 tablets daily) and then take the '20mg'$  tablet daily.

## 2022-05-01 NOTE — Telephone Encounter (Signed)
Patient wanted number to [employer]. [phone].

## 2022-05-02 ENCOUNTER — Other Ambulatory Visit (HOSPITAL_BASED_OUTPATIENT_CLINIC_OR_DEPARTMENT_OTHER): Payer: Self-pay

## 2022-05-02 ENCOUNTER — Telehealth: Payer: Self-pay | Admitting: Internal Medicine

## 2022-05-02 ENCOUNTER — Ambulatory Visit: Payer: Self-pay

## 2022-05-02 ENCOUNTER — Telehealth: Payer: Self-pay

## 2022-05-02 ENCOUNTER — Telehealth (HOSPITAL_BASED_OUTPATIENT_CLINIC_OR_DEPARTMENT_OTHER): Payer: Self-pay

## 2022-05-02 ENCOUNTER — Other Ambulatory Visit (HOSPITAL_COMMUNITY): Payer: Self-pay

## 2022-05-02 DIAGNOSIS — R0600 Dyspnea, unspecified: Secondary | ICD-10-CM | POA: Diagnosis not present

## 2022-05-02 NOTE — Telephone Encounter (Signed)
Thanks for FYI

## 2022-05-02 NOTE — Telephone Encounter (Signed)
Patient states some called her and told her she went into A-Fib and put her on 20 mg of Xarelto.  She wants to know how long she was in A-fib.

## 2022-05-02 NOTE — Telephone Encounter (Signed)
Transitions of Care Pharmacy   Call attempted for a pharmacy transitions of care follow-up. Patient was driving, will follow up tomorrow.  Call attempt #1.   Darcus Austin, PharmD [place] 05/02/2022 4:02 PM

## 2022-05-02 NOTE — Telephone Encounter (Signed)
Returned her call. She is requesting to move 7/11 appts to 7/14 Friday. Sent a scheduling message to move appts. Offered transportation assistance and she declined. She said that she always come on a Friday and she has someone to bring her.  FYI

## 2022-05-02 NOTE — Chronic Care Management (AMB) (Signed)
   05/02/2022  AESHA AGRAWAL 09-Mar-1951 [id number]  Incoming call from patient who was concerned she received a call yesterday from a nurse at cardiology who told her she was in a-fib.  Patient is wearing a heart monitor for 30 days and was told to take Xarelto 20 mg once per day.  Ms. Raven noted she did not feel like she was in a-fib and is unsure about taking this medication as she does not want to have bleeding.  This RMCM encouraged patient to call the cardiologist office and discuss with them about her a-fib.  Patient agreed to call cardiologist. [name], RN, BSN, CCM Care Management Coordinator [facility] Phone: [phone]: [phone]

## 2022-05-02 NOTE — Telephone Encounter (Signed)
Patient wanted to know how long she was in afbi/flutter on 04/29/22. I told her 1-2 minutes with rate 120-170. I explained that is the reason Dr. Debara Pickett increased xarelto from '10mg'$  daily to '20mg'$  daily. Patient reports no new vaginal bleeding, but has red drainage when wiping. She has appointment with gyn on 7/10. She was splitting xarelto '10mg'$  am and '10mg'$  PM. I explained for her to take 20 mg at the same time. She will complete her '10mg'$  tablets on Friday and then start the '20mg'$  tablet.

## 2022-05-03 ENCOUNTER — Other Ambulatory Visit (HOSPITAL_COMMUNITY): Payer: Self-pay

## 2022-05-03 ENCOUNTER — Telehealth (HOSPITAL_COMMUNITY): Payer: Self-pay

## 2022-05-03 ENCOUNTER — Telehealth: Payer: Self-pay

## 2022-05-03 ENCOUNTER — Encounter: Payer: Self-pay | Admitting: Hematology and Oncology

## 2022-05-03 NOTE — Telephone Encounter (Signed)
Pharmacy Transitions of Care Follow-up Telephone Call  Date of discharge: 04/15/22  Discharge Diagnosis: Afib  How have you been since you were released from the hospital? She has been doing better since she was released. She was concerned about her vaginal bleeding and wanted to know if the Xarelto would stop causing this. We discussed how it likely could be related to the uterine malignancy and recent surgery. We discussed other signs and symptoms of bleeding and when she needs to visit the hospital. We discussed the importance of follow-up with her cardiologist to determine if Xarelto will continue to be warranted. She stated she seems to be healing up from her recent surgery but is still experiencing stomach pain.   Medication changes made at discharge: START taking these medications  START taking these medications  diltiazem 120 MG 24 hr capsule Commonly known as: CARDIZEM CD Take 1 capsule (120 mg total) by mouth daily.  Xarelto  CHANGE how you take these medications  CHANGE how you take these medications  Victoza 18 MG/3ML Sopn Generic drug: liraglutide PLEASE START TAKING VICTOZA 0.6MG DAILY WEEK 1, THEN TAKE 1.2MG DAILY WEEK 2, AND THEN TAKE 1.8MG DAILY THEREAFTER What changed:  how much to take how to take this when to take this additional instructions   CONTINUE taking these medications  CONTINUE taking these medications  acetaminophen 500 MG tablet Commonly known as: TYLENOL  albuterol 108 (90 Base) MCG/ACT inhaler Commonly known as: VENTOLIN HFA Inhale 2 puffs into the lungs every 4 (four) hours as needed for wheezing or shortness of breath.  atorvastatin 20 MG tablet Commonly known as: LIPITOR Take 1 tablet (20 mg total) by mouth at bedtime.  B-complex with vitamin C tablet  Cholecalciferol 25 MCG (1000 UT) capsule Take 1 capsule (1,000 Units total) by mouth daily.  dexamethasone 4 MG tablet Commonly known as: DECADRON Take 2 tablets by mouth the night before  and 2 tablets the morning of chemotherapy, every 3 weeks for 6 cycles  dorzolamide-timolol 22.3-6.8 MG/ML ophthalmic solution Commonly known as: COSOPT  glucose blood test strip Commonly known as: ONE TOUCH ULTRA TEST Use as instructed  Insulin Pen Needle 32G X 4 MM Misc 1 each by Does not apply route daily.  latanoprost 0.005 % ophthalmic solution Commonly known as: XALATAN  lidocaine-prilocaine cream Commonly known as: EMLA Apply to affected area once  losartan 100 MG tablet Commonly known as: COZAAR  magnesium oxide 400 (240 Mg) MG tablet Commonly known as: MAG-OX Take 1 tablet by mouth 2 times daily.  metFORMIN 1000 MG tablet Commonly known as: GLUCOPHAGE  ondansetron 8 MG tablet Commonly known as: Zofran Take 1 tablet (8 mg total) by mouth 2 (two) times daily as needed. Start on the third day after chemotherapy.  ONE TOUCH ULTRA MINI w/Device Kit Please use as directed.  OneTouch Delica Lancets 40N Misc Please use as directed.  pantoprazole 40 MG tablet Commonly known as: Protonix Take 1 tablet by mouth daily.  polyethylene glycol powder 17 GM/SCOOP powder Commonly known as: GLYCOLAX/MIRALAX Take 17 g by mouth daily.  prochlorperazine 10 MG tablet Commonly known as: COMPAZINE Take 1 tablet (10 mg total) by mouth every 6 (six) hours as needed (Nausea or vomiting).  senna-docusate 8.6-50 MG tablet Commonly known as: Senokot-S Take 2 tablets by mouth at bedtime. For AFTER surgery, do not take if having diarrhea   STOP taking these medications  STOP taking these medications  amLODipine 10 MG tablet Commonly known as: NORVASC  ibuprofen  600 MG tablet Commonly known as: ADVIL  traMADol 50 MG tablet Commonly known as: ULTRAM    Medication changes verified by the patient? Yes  Medication Accessibility:  Home Pharmacy: [place]   Was the patient provided with refills on discharged medications? yes   Have all prescriptions been transferred  from [facility] to home pharmacy? yes   Is the patient able to afford medications? yes She stated that her copay was about $50. She was concerned about being able to afford her Xarelto.    Medication Review:  RIVAROXABAN (XARELTO)  Rivaroxaban 10 mg discharged, now on 20 mg  - Discussed importance of taking medication with food and around the same time everyday  - Reviewed potential DDIs with patient  - Advised patient of medications to avoid (NSAIDs, ASA)  - Educated that Tylenol (acetaminophen) will be the preferred analgesic to prevent risk of bleeding  - Emphasized importance of monitoring for signs and symptoms of bleeding (abnormal bruising, prolonged bleeding, nose bleeds, bleeding from gums, discolored urine, black tarry stools)  - Advised patient to alert all providers of anticoagulation therapy prior to starting a new medication or having a procedure   Follow-up Appointments: Date Visit Type Length Department    05/06/2022  2:15 PM POST OP 15 min [facility] [[id number]]  Patient Instructions:            05/07/2022  8:30 AM PORT FLUSH W/LAB 15 min [facility] [[id number]]  Patient Instructions:            05/07/2022  8:40 AM EST PT 20 20 min [facility] [[id number]]  Patient Instructions:            05/07/2022  9:30 AM INFUSION 5H 22M 330 min [place] [[id number]]   05/07/2022  9:30 AM INFUSION 5H 22M 330 min [place] [[id number]    If their condition worsens, is the pt aware to call PCP or go to the Emergency Dept.? Yes  Final Patient Assessment: She has been doing better since she was released. She was concerned about her vaginal bleeding and wanted to know if the Xarelto would stop causing this. We discussed how it likely could be related to the uterine malignancy and recent surgery. We discussed other signs  and symptoms of bleeding and when she needs to visit the hospital. We discussed the importance of follow-up with her cardiologist to determine if Xarelto will continue to be warranted. She stated she seems to be healing up from her recent surgery but is still experiencing stomach pain.   Lind Guest, PharmD Clinical Pharmacy Resident  [facility] 05/03/2022 2:53 PM

## 2022-05-03 NOTE — Telephone Encounter (Signed)
Return call to pt, advised of upcoming appointments (Tuesday) and if she would like to move to Fridays in the future we would try to accommodate that.  Pt verbalized understanding and plans to be here for upcoming appoints on a Tuesday.

## 2022-05-06 ENCOUNTER — Inpatient Hospital Stay: Payer: Medicare HMO | Attending: Gynecologic Oncology | Admitting: Gynecologic Oncology

## 2022-05-06 ENCOUNTER — Encounter: Payer: Self-pay | Admitting: Gynecologic Oncology

## 2022-05-06 ENCOUNTER — Other Ambulatory Visit: Payer: Self-pay

## 2022-05-06 VITALS — BP 157/63 | HR 80 | Temp 98.4°F | Resp 16 | Ht 63.0 in | Wt 166.0 lb

## 2022-05-06 DIAGNOSIS — K219 Gastro-esophageal reflux disease without esophagitis: Secondary | ICD-10-CM | POA: Insufficient documentation

## 2022-05-06 DIAGNOSIS — Z5986 Financial insecurity: Secondary | ICD-10-CM | POA: Insufficient documentation

## 2022-05-06 DIAGNOSIS — Z90710 Acquired absence of both cervix and uterus: Secondary | ICD-10-CM | POA: Insufficient documentation

## 2022-05-06 DIAGNOSIS — Z8349 Family history of other endocrine, nutritional and metabolic diseases: Secondary | ICD-10-CM | POA: Insufficient documentation

## 2022-05-06 DIAGNOSIS — Z7952 Long term (current) use of systemic steroids: Secondary | ICD-10-CM | POA: Insufficient documentation

## 2022-05-06 DIAGNOSIS — D72829 Elevated white blood cell count, unspecified: Secondary | ICD-10-CM | POA: Insufficient documentation

## 2022-05-06 DIAGNOSIS — Z818 Family history of other mental and behavioral disorders: Secondary | ICD-10-CM | POA: Insufficient documentation

## 2022-05-06 DIAGNOSIS — Z880 Allergy status to penicillin: Secondary | ICD-10-CM | POA: Insufficient documentation

## 2022-05-06 DIAGNOSIS — I480 Paroxysmal atrial fibrillation: Secondary | ICD-10-CM | POA: Insufficient documentation

## 2022-05-06 DIAGNOSIS — E669 Obesity, unspecified: Secondary | ICD-10-CM | POA: Insufficient documentation

## 2022-05-06 DIAGNOSIS — E119 Type 2 diabetes mellitus without complications: Secondary | ICD-10-CM | POA: Insufficient documentation

## 2022-05-06 DIAGNOSIS — Z888 Allergy status to other drugs, medicaments and biological substances status: Secondary | ICD-10-CM | POA: Insufficient documentation

## 2022-05-06 DIAGNOSIS — Z800 Family history of malignant neoplasm of digestive organs: Secondary | ICD-10-CM | POA: Insufficient documentation

## 2022-05-06 DIAGNOSIS — Z803 Family history of malignant neoplasm of breast: Secondary | ICD-10-CM | POA: Insufficient documentation

## 2022-05-06 DIAGNOSIS — Z87891 Personal history of nicotine dependence: Secondary | ICD-10-CM | POA: Insufficient documentation

## 2022-05-06 DIAGNOSIS — I3139 Other pericardial effusion (noninflammatory): Secondary | ICD-10-CM | POA: Insufficient documentation

## 2022-05-06 DIAGNOSIS — I10 Essential (primary) hypertension: Secondary | ICD-10-CM | POA: Insufficient documentation

## 2022-05-06 DIAGNOSIS — Z7984 Long term (current) use of oral hypoglycemic drugs: Secondary | ICD-10-CM | POA: Insufficient documentation

## 2022-05-06 DIAGNOSIS — Z90722 Acquired absence of ovaries, bilateral: Secondary | ICD-10-CM

## 2022-05-06 DIAGNOSIS — D509 Iron deficiency anemia, unspecified: Secondary | ICD-10-CM | POA: Insufficient documentation

## 2022-05-06 DIAGNOSIS — Z8042 Family history of malignant neoplasm of prostate: Secondary | ICD-10-CM | POA: Insufficient documentation

## 2022-05-06 DIAGNOSIS — Z7189 Other specified counseling: Secondary | ICD-10-CM

## 2022-05-06 DIAGNOSIS — K429 Umbilical hernia without obstruction or gangrene: Secondary | ICD-10-CM | POA: Insufficient documentation

## 2022-05-06 DIAGNOSIS — Z885 Allergy status to narcotic agent status: Secondary | ICD-10-CM | POA: Insufficient documentation

## 2022-05-06 DIAGNOSIS — C55 Malignant neoplasm of uterus, part unspecified: Secondary | ICD-10-CM | POA: Insufficient documentation

## 2022-05-06 DIAGNOSIS — Z79899 Other long term (current) drug therapy: Secondary | ICD-10-CM | POA: Insufficient documentation

## 2022-05-06 DIAGNOSIS — R188 Other ascites: Secondary | ICD-10-CM | POA: Insufficient documentation

## 2022-05-06 DIAGNOSIS — Z7901 Long term (current) use of anticoagulants: Secondary | ICD-10-CM | POA: Insufficient documentation

## 2022-05-06 DIAGNOSIS — Z5111 Encounter for antineoplastic chemotherapy: Secondary | ICD-10-CM | POA: Insufficient documentation

## 2022-05-06 DIAGNOSIS — Z9221 Personal history of antineoplastic chemotherapy: Secondary | ICD-10-CM | POA: Insufficient documentation

## 2022-05-06 DIAGNOSIS — Z9079 Acquired absence of other genital organ(s): Secondary | ICD-10-CM

## 2022-05-06 DIAGNOSIS — R971 Elevated cancer antigen 125 [CA 125]: Secondary | ICD-10-CM | POA: Insufficient documentation

## 2022-05-06 NOTE — Patient Instructions (Signed)
It was good to see you today.  I will see you after you finish chemotherapy.  Please do not hesitate to reach out if you need anything.

## 2022-05-06 NOTE — Progress Notes (Signed)
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 Heterogeneous endometrial thickening with appearance of moderate complex fluid in the endometrial canal, possible hemorrhagic material. Endometrial thickness is considered abnormal for an asymptomatic post-menopausal female. Endometrial sampling should be considered to exclude carcinoma. 2. Nonvisualized ovary 3. Heterogeneous thick-walled urinary bladder, correlate for cystitis.   12/28/2021 Pathology Results   CYTOLOGY - NON PAP  CASE: [id number]  PATIENT: Sherri Gill  Non-Gynecological Cytology Report   Clinical History: Abnormal pelvic US, highly suspicious of malignant ascites  Specimen Submitted:  A. ASCITES, PARACENTESIS:    FINAL MICROSCOPIC DIAGNOSIS:  - Malignant cells consistent with adenocarcinoma  - See comment   SPECIMEN ADEQUACY:  Satisfactory for evaluation   DIAGNOSTIC COMMENTS:  Immunohistochemical stains show that the tumor cells are positive for CK7 and PAX8 while they are negative for CK20, CDX2 and ER, consistent with above interpretation.  Additionally, the tumor cells are diffusely positive for p53 (clonal overexpression), suggestive of a high-grade serous carcinoma.    12/28/2021 Imaging   US pelvis 1. Persistent abnormal appearance of the endometrium, heterogeneously thickened and complex with possible intraluminal fluid. This is similar to that seen on December 2022 ultrasound. Endometrial thickness is considered abnormal for an asymptomatic post-menopausal female. Endometrial sampling should be considered to exclude carcinoma. 2. Nonvisualization of the ovaries.   12/28/2021 Imaging   1. Large volume ascites. 2. Small umbilical hernia containing fat and  ascites. 3. Haziness of anterior omentum most likely related to ascites, other etiologies such as metastatic disease can not be excluded. 4. Diffuse colonic diverticulosis without evidence for diverticulitis.   12/29/2021 Procedure   Successful ultrasound-guided paracentesis yielding 2.8 liters of peritoneal fluid.   01/14/2022 Initial Diagnosis   Uterine cancer ([place])   01/14/2022 Cancer Staging   Staging form: Corpus Uteri - Carcinoma and Carcinosarcoma, AJCC 8th Edition - Clinical: Stage III (cT3, cN0, cM0) - Signed by [name], MD on 01/14/2022 Stage prefix: Initial diagnosis   01/15/2022 Surgery   Surgery: Cervical dilation under ultrasound guidance, hysteroscopy, endometrial sampling using the Myosure   Surgeons:  Valarie Cones MD    Pathology: endometrial curetteings   Operative findings: On EUA, 8 cm mobile uterus, some nodularity along cul de sac. Cervix normal in appearance without discernible external os. Difficulty noted in dilating the cervix after using scalpel to incise in area of what what thought to be the external os. Under ultrasound guidance, cervix was dilated with confirmation on imaging of intra-uterine placement of dilators. On hysteroscopy, abnormal appearing endocervix and very calcified tissue obscuring good visualization of endometrium concerning for replacement by tumor.    01/15/2022 Pathology Results   FINAL MICROSCOPIC DIAGNOSIS:   A.   ENDOMETRIUM, CURETTAGE:  -    Serous endometrial carcinoma.   COMMENT:   The carcinoma is strongly and diffusely positive for p53 and diffuse p16 positivity.  The ER shows relatively diffuse positivity.  Overall, the histologic features (slit-like glandular spaces and psammoma bodies) and the immunohistochemical phenotype are most characteristic for a serous carcinoma.    01/21/2022 Procedure   Placement of single lumen port a cath via right internal jugular vein. The catheter tip lies at the cavo-atrial junction. A power  injectable port a cath was placed and is ready for immediate use.  01/25/2022 -  Chemotherapy   Patient is on Treatment Plan : UTERINE Carboplatin AUC 6 / Paclitaxel q21d     03/21/2022 Imaging   1. Near complete resolution of previously noted ascites, with only small volume residual perihepatic ascites. 2. Improved omental caking and stranding in the ventral abdomen. 3. Findings are consistent with treatment response of peritoneal metastatic disease. 4. Thickening of the urinary bladder wall , consistent with nonspecific infectious or inflammatory cystitis. Correlate with urinalysis.   Aortic Atherosclerosis (ICD10-I70.0).       03/22/2022 Tumor Marker   Patient's tumor was tested for the following markers: CA-125. Results of the tumor marker test revealed 21.5.   04/09/2022 Surgery     Surgery: Diagnostic laparoscopy, conversion to exploratory laparotomy, partial omentectomy, lysis of adhesions for approximately 60 minutes, total abdominal hysterectomy, bilateral salpingo-oophorectomy, excision of ileal tumor implant and excision of transverse colon epiploica tumor implant   04/09/2022 Pathology Results   A.   OMENTUM, RESECTION:  -    Metastatic serous carcinoma.   B.   ILEAL TUMOR IMPLANTS, EXCISION:  -    Adhesions with small focus of metastatic serous carcinoma, possibly  intralymphatic.   C.   UTERUS, CERVIX, BILATERAL FALLOPIAN TUBES AND OVARIES:  -    Serous carcinoma, endometrial, invasive into outer half of uterus,  with involvement of  cervical stroma, uterine serosa, ovaries and fallopian tubes.   -    Negative for cervical dysplasia.  -    Leiomyomas, largest 1.2 cm in greatest dimension.   D.   COLON, MESENTARY NODULE, TRANSVERSE, EXCISION:  -    Metastatic serous carcinoma     Interval History: Patient reports overall doing well.  Has some intermittent pulling sensation as well as abdominal pain, overall improving daily.  Continues to have some very light  vaginal spotting, which she noted increased slightly after her Xarelto dose was increased to 20 mg daily on 7/5.  This happened after she was noted to be in A-fib.  Has her monitor on until the end of the month.  Reports regular bowel and bladder function.  Has noted some pruritus of her skin since increasing her dose of Xarelto.  Past Medical/Surgical History: Past Medical History:  Diagnosis Date   AKI (acute kidney injury) ([place])    History of   Ascites    09/2021 paracentesis with almost 3 L of fluid 12/2021 Paracentesis performed with 2.8 L   Chronic anemia 08/29/2012   Need colonoscopy or report. Need iron panel.    Dyslipidemia 06/30/2007   Dyspnea    Essential hypertension, benign 06/30/2007   GERD 06/30/2007   History of blood transfusion    History of fatty infiltration of liver    History of migraine    Hx of Herpes simplex meningitis 2015   Also noted to have primary empty sella on imaging at this admission   Insomnia 06/11/2012   Major depressive disorder, recurrent episode, moderate with anxious distress ([place]) 06/30/2007   Nausea and vomiting 01/13/2022   Obesity, BMI 35-40 06/11/2012   Peripheral neuropathy 2/2 T2DM 12/28/2009   Primary empty sella syndrome ([place]) 2015   Noted on imaging during hospitalization for herpes meningitis; no pituitary mass, no hormone w/u at that time, hormonally asymptomatic   Type 2 diabetes mellitus with neurological complications ([place]) 78/29/5621    Past Surgical History:  Procedure Laterality Date   COLONOSCOPY W/ POLYPECTOMY  06/28/2004   DENTAL SURGERY     DILATATION & CURETTAGE/HYSTEROSCOPY WITH MYOSURE  N/A 01/15/2022   Procedure: DILATATION & CURETTAGE/HYSTEROSCOPY WITH MYOSURE;  Surgeon: Lafonda Mosses, MD;  Location: [facility];  Service: Gynecology;  Laterality: N/A;  DO NOT OPEN HYSTEROSCOPY KIT   HYSTERECTOMY ABDOMINAL WITH SALPINGO-OOPHORECTOMY Bilateral 04/09/2022   Procedure: HYSTERECTOMY ABDOMINAL BILATERAL SALPINGO  OOPHORECTOMY WITH OMENTECTOMY ,DEBULKING;  Surgeon: Lafonda Mosses, MD;  Location: [facility];  Service: Gynecology;  Laterality: Bilateral;   IR IMAGING GUIDED PORT INSERTION  01/18/2022   LAPAROSCOPY N/A 04/09/2022   Procedure: LAPAROSCOPY DIAGNOSTIC;  Surgeon: Lafonda Mosses, MD;  Location: [facility];  Service: Gynecology;  Laterality: N/A;   OPERATIVE ULTRASOUND N/A 01/15/2022   Procedure: OPERATIVE ULTRASOUND;  Surgeon: Lafonda Mosses, MD;  Location: [facility];  Service: Gynecology;  Laterality: N/A;   port a cath placement     TUBAL LIGATION     age 85    Family History  Adopted: Yes  Problem Relation Age of Onset   Dementia Mother    Breast cancer Sister        paternal half-sister   Liver cancer Brother        maternal half-brother   Thyroid cancer Maternal Aunt    Prostate cancer Maternal Uncle    Prostate cancer Cousin    Breast cancer Other    Ovarian cancer Neg Hx    Colon cancer Neg Hx    Endometrial cancer Neg Hx    Pancreatic cancer Neg Hx     Social History   Socioeconomic History   Marital status: Single    Spouse name: Not on file   Number of children: Not on file   Years of education: 12   Highest education level: Not on file  Occupational History   Occupation: Scientist, clinical (histocompatibility and immunogenetics): [employer]  Tobacco Use   Smoking status: Former    Packs/day: 0.25    Years: 10.00    Total pack years: 2.50    Types: Cigarettes    Quit date: 10/28/1978    Years since quitting: 43.5   Smokeless tobacco: Never  Vaping Use   Vaping Use: Never used  Substance and Sexual Activity   Alcohol use: No    Alcohol/week: 0.0 standard drinks of alcohol   Drug use: No   Sexual activity: Not Currently    Partners: Male    Birth control/protection: Post-menopausal  Other Topics Concern   Not on file  Social History Narrative   Not on file   Social Determinants of Health   Financial Resource Strain: Medium Risk (03/15/2022)   Overall Financial Resource Strain (CARDIA)     Difficulty of Paying Living Expenses: Somewhat hard  Food Insecurity: No Food Insecurity (03/31/2020)   Hunger Vital Sign    Worried About Running Out of Food in the Last Year: Never true    Ran Out of Food in the Last Year: Never true  Transportation Needs: No Transportation Needs (03/31/2020)   PRAPARE - Hydrologist (Medical): No    Lack of Transportation (Non-Medical): No  Physical Activity: Not on file  Stress: Stress Concern Present (03/15/2022)   [place]    Feeling of Stress : Very much  Social Connections: Not on file    Current Medications:  Current Outpatient Medications:    acetaminophen (TYLENOL) 500 MG tablet, Take 1,000 mg by mouth every 6 (six) hours as needed for mild pain., Disp: , Rfl:    albuterol (VENTOLIN HFA) 108 (  90 Base) MCG/ACT inhaler, Inhale 2 puffs into the lungs every 4 (four) hours as needed for wheezing or shortness of breath., Disp: 8 g, Rfl: 0   atorvastatin (LIPITOR) 20 MG tablet, Take 1 tablet (20 mg total) by mouth at bedtime., Disp: 30 tablet, Rfl: 11   B Complex-C (B-COMPLEX WITH VITAMIN C) tablet, Take 1 tablet by mouth in the morning., Disp: , Rfl:    Blood Glucose Monitoring Suppl (ONE TOUCH ULTRA MINI) w/Device KIT, Please use as directed., Disp: 1 each, Rfl: 0   Cholecalciferol 1000 units capsule, Take 1 capsule (1,000 Units total) by mouth daily., Disp: 90 capsule, Rfl: 1   dexamethasone (DECADRON) 4 MG tablet, Take 2 tablets by mouth the night before and 2 tablets the morning of chemotherapy, every 3 weeks for 6 cycles, Disp: 36 tablet, Rfl: 6   diltiazem (CARDIZEM CD) 120 MG 24 hr capsule, Take 1 capsule (120 mg total) by mouth daily., Disp: 30 capsule, Rfl: 2   dorzolamide-timolol (COSOPT) 22.3-6.8 MG/ML ophthalmic solution, Place 1 drop into both eyes 2 (two) times daily., Disp: , Rfl:    glucose blood (ONE TOUCH ULTRA TEST) test strip, Use as  instructed, Disp: 100 each, Rfl: 1   Insulin Pen Needle 32G X 4 MM MISC, 1 each by Does not apply route daily., Disp: 100 each, Rfl: 3   latanoprost (XALATAN) 0.005 % ophthalmic solution, Place 1 drop into both eyes at bedtime., Disp: , Rfl:    lidocaine-prilocaine (EMLA) cream, Apply to affected area once, Disp: 30 g, Rfl: 3   liraglutide (VICTOZA) 18 MG/3ML SOPN, PLEASE START TAKING VICTOZA 0.6MG DAILY WEEK 1, THEN TAKE 1.2MG DAILY WEEK 2, AND THEN TAKE 1.8MG DAILY THEREAFTER (Patient taking differently: Inject 1.2 mg into the skin daily.), Disp: 9 mL, Rfl: 3   losartan (COZAAR) 100 MG tablet, Take 100 mg by mouth at bedtime., Disp: , Rfl:    magnesium oxide (MAG-OX) 400 (240 Mg) MG tablet, Take 1 tablet by mouth 2 times daily., Disp: 60 tablet, Rfl: 1   metFORMIN (GLUCOPHAGE) 1000 MG tablet, Take 1,000 mg by mouth 2 (two) times daily., Disp: , Rfl:    ondansetron (ZOFRAN) 8 MG tablet, Take 1 tablet (8 mg total) by mouth 2 (two) times daily as needed. Start on the third day after chemotherapy., Disp: 30 tablet, Rfl: 1   ONETOUCH DELICA LANCETS 00B MISC, Please use as directed., Disp: 100 each, Rfl: 0   pantoprazole (PROTONIX) 40 MG tablet, Take 1 tablet by mouth daily., Disp: 30 tablet, Rfl: 1   polyethylene glycol powder (GLYCOLAX/MIRALAX) 17 GM/SCOOP powder, Take 17 g by mouth daily., Disp: 238 g, Rfl: 1   prochlorperazine (COMPAZINE) 10 MG tablet, Take 1 tablet (10 mg total) by mouth every 6 (six) hours as needed (Nausea or vomiting)., Disp: 30 tablet, Rfl: 1   rivaroxaban (XARELTO) 20 MG TABS tablet, Take 1 tablet (20 mg total) by mouth daily with supper., Disp: 30 tablet, Rfl: 1   senna-docusate (SENOKOT-S) 8.6-50 MG tablet, Take 2 tablets by mouth at bedtime. For AFTER surgery, do not take if having diarrhea, Disp: 30 tablet, Rfl: 0   traMADol (ULTRAM) 50 MG tablet, Take 1 tablet (50 mg total) by mouth every 6 (six) hours as needed for severe pain. Do not take and drive, Disp: 10 tablet, Rfl:  0  Review of Systems: + Decreased appetite, pelvic pain, anxiety, vaginal spotting Denies fevers, chills, fatigue, unexplained weight changes. Denies hearing loss, neck lumps or masses, mouth  sores, ringing in ears or voice changes. Denies cough or wheezing.  Denies shortness of breath. Denies chest pain or palpitations. Denies leg swelling. Denies abdominal distention, pain, blood in stools, constipation, diarrhea, nausea, vomiting, or early satiety. Denies pain with intercourse, dysuria, frequency, hematuria or incontinence. Denies hot flashes or vaginal discharge.   Denies joint pain, back pain or muscle pain/cramps. Denies itching, rash, or wounds. Denies dizziness, headaches, numbness or seizures. Denies swollen lymph nodes or glands, denies easy bruising or bleeding. Denies depression, confusion, or decreased concentration.  Physical Exam: BP (!) 157/63 (BP Location: Right Arm, Patient Position: Sitting)   Pulse 80   Temp 98.4 F (36.9 C) (Oral)   Resp 16   Ht 5' 3"  (1.6 m)   Wt 166 lb (75.3 kg)   SpO2 100%   BMI 29.41 kg/m  General: Alert, oriented, no acute distress. HEENT: Normocephalic, atraumatic, sclera anicteric. Chest: Clear to auscultation bilaterally, no wheezes or rhonchi. Abdomen: soft, nontender.  Normoactive bowel sounds.  No masses or hepatosplenomegaly appreciated.  Well-healed scar, remaining Dermabond removed. Extremities: Grossly normal range of motion.  Warm, well perfused.  No edema bilaterally. Skin: No rashes or lesions noted. GU: Normal appearing external genitalia without erythema, excoriation, or lesions.  Speculum exam reveals no bleeding or discharge noted, cuff intact, some suture still visible.  Bimanual exam reveals cuff intact, no fluctuance or tenderness with palpation.    Laboratory & Radiologic Studies: A.   OMENTUM, RESECTION:  -    Metastatic serous carcinoma.   B.   ILEAL TUMOR IMPLANTS, EXCISION:  -    Adhesions with small focus of  metastatic serous carcinoma, possibly  intralymphatic.   C.   UTERUS, CERVIX, BILATERAL FALLOPIAN TUBES AND OVARIES:  -    Serous carcinoma, endometrial, invasive into outer half of uterus,  with involvement of  cervical stroma, uterine serosa, ovaries and fallopian tubes.   -    Negative for cervical dysplasia.  -    Leiomyomas, largest 1.2 cm in greatest dimension.   D.   COLON, MESENTARY NODULE, TRANSVERSE, EXCISION:  -    Metastatic serous carcinoma.   ONCOLOGY TABLE:   UTERUS, CARCINOMA OR CARCINOSARCOMA: Resection   Procedure: Total hysterectomy and bilateral salpingo-oophorectomy  Histologic Type: Serous carcinoma  Histologic Grade: Not applicable  Myometrial Invasion:       Depth of Myometrial Invasion (mm): 13 mm       Myometrial Thickness (mm): 17 mm       Percentage of Myometrial Invasion: 76%  Uterine Serosa Involvement: Present  Cervical stromal Involvement: Present  Extent of involvement of other tissue/organs: Right ovary, left ovary,  right fallopian tube, left fallopian tube, transverse colon mesentery,  ileal adhesion, omentum  Peritoneal/Ascitic Fluid: Malignant cells present  Lymphovascular Invasion: Present  Regional Lymph Nodes: Not applicable (no lymph nodes submitted or found)   Assessment & Plan: Sherri Gill is a 71 y.o. woman with advanced serous uterine cancer status post neoadjuvant chemotherapy now status post interval debulking surgery presenting for follow-up.  Patient is overall doing well after surgery, meeting milestones.  Discussed continued expectations as well as limitations.  I do not see any obvious bleeding or source of bleeding on pelvic exam today.  She continues to have very little spotting, especially after her dose of Xarelto was increased.  I have asked her to talk to the cardiologist at her follow-up about whether she needs to be on this increased dose of Xarelto.  The patient sees Dr.  Alvy Bimler tomorrow, plan to reinitiate  adjuvant chemotherapy.  We reviewed her pathology report from surgery and the patient was provided a copy of this report.  Discussed findings of uterine serous carcinoma in all tissue removed including 2 implants from the transverse colon and small bowel.  Patient had significant omental disease as well as obliteration of upper abdominal anatomy secondary to treatment effect versus disease.  I removed all evidence of disease that I could see or feel in the mid abdomen and pelvis but was not able to fully assess the upper abdomen secondary to findings at the time of surgery.  Understands plan is for continued carboplatin and paclitaxel with imaging after she finishes 6 cycles.  I will see her back when she finishes adjuvant treatment.  28 minutes of total time was spent for this patient encounter, including preparation, face-to-face counseling with the patient and coordination of care, and documentation of the encounter.  Jeral Pinch, MD  Division of Gynecologic Oncology  Department of Obstetrics and Gynecology  [facility]

## 2022-05-07 ENCOUNTER — Inpatient Hospital Stay: Payer: Medicare HMO | Admitting: Hematology and Oncology

## 2022-05-07 ENCOUNTER — Inpatient Hospital Stay: Payer: Medicare HMO

## 2022-05-07 ENCOUNTER — Other Ambulatory Visit (HOSPITAL_COMMUNITY): Payer: Self-pay

## 2022-05-07 ENCOUNTER — Encounter: Payer: Self-pay | Admitting: Hematology and Oncology

## 2022-05-07 DIAGNOSIS — I3139 Other pericardial effusion (noninflammatory): Secondary | ICD-10-CM | POA: Diagnosis not present

## 2022-05-07 DIAGNOSIS — K219 Gastro-esophageal reflux disease without esophagitis: Secondary | ICD-10-CM | POA: Diagnosis not present

## 2022-05-07 DIAGNOSIS — Z7952 Long term (current) use of systemic steroids: Secondary | ICD-10-CM | POA: Diagnosis not present

## 2022-05-07 DIAGNOSIS — Z9221 Personal history of antineoplastic chemotherapy: Secondary | ICD-10-CM | POA: Diagnosis not present

## 2022-05-07 DIAGNOSIS — C55 Malignant neoplasm of uterus, part unspecified: Secondary | ICD-10-CM

## 2022-05-07 DIAGNOSIS — D72829 Elevated white blood cell count, unspecified: Secondary | ICD-10-CM | POA: Diagnosis not present

## 2022-05-07 DIAGNOSIS — Z7984 Long term (current) use of oral hypoglycemic drugs: Secondary | ICD-10-CM | POA: Diagnosis not present

## 2022-05-07 DIAGNOSIS — Z885 Allergy status to narcotic agent status: Secondary | ICD-10-CM | POA: Diagnosis not present

## 2022-05-07 DIAGNOSIS — Z888 Allergy status to other drugs, medicaments and biological substances status: Secondary | ICD-10-CM | POA: Diagnosis not present

## 2022-05-07 DIAGNOSIS — Z5111 Encounter for antineoplastic chemotherapy: Secondary | ICD-10-CM | POA: Diagnosis present

## 2022-05-07 DIAGNOSIS — Z90710 Acquired absence of both cervix and uterus: Secondary | ICD-10-CM | POA: Diagnosis not present

## 2022-05-07 DIAGNOSIS — Z87891 Personal history of nicotine dependence: Secondary | ICD-10-CM | POA: Diagnosis not present

## 2022-05-07 DIAGNOSIS — I4891 Unspecified atrial fibrillation: Secondary | ICD-10-CM | POA: Diagnosis not present

## 2022-05-07 DIAGNOSIS — D509 Iron deficiency anemia, unspecified: Secondary | ICD-10-CM | POA: Diagnosis not present

## 2022-05-07 DIAGNOSIS — K429 Umbilical hernia without obstruction or gangrene: Secondary | ICD-10-CM | POA: Diagnosis not present

## 2022-05-07 DIAGNOSIS — R971 Elevated cancer antigen 125 [CA 125]: Secondary | ICD-10-CM | POA: Diagnosis not present

## 2022-05-07 DIAGNOSIS — D508 Other iron deficiency anemias: Secondary | ICD-10-CM

## 2022-05-07 DIAGNOSIS — Z79899 Other long term (current) drug therapy: Secondary | ICD-10-CM | POA: Diagnosis not present

## 2022-05-07 DIAGNOSIS — E669 Obesity, unspecified: Secondary | ICD-10-CM | POA: Diagnosis not present

## 2022-05-07 DIAGNOSIS — I480 Paroxysmal atrial fibrillation: Secondary | ICD-10-CM | POA: Diagnosis not present

## 2022-05-07 DIAGNOSIS — E119 Type 2 diabetes mellitus without complications: Secondary | ICD-10-CM | POA: Diagnosis not present

## 2022-05-07 DIAGNOSIS — Z880 Allergy status to penicillin: Secondary | ICD-10-CM | POA: Diagnosis not present

## 2022-05-07 DIAGNOSIS — G8918 Other acute postprocedural pain: Secondary | ICD-10-CM

## 2022-05-07 DIAGNOSIS — R188 Other ascites: Secondary | ICD-10-CM | POA: Diagnosis not present

## 2022-05-07 DIAGNOSIS — Z5986 Financial insecurity: Secondary | ICD-10-CM | POA: Diagnosis not present

## 2022-05-07 DIAGNOSIS — Z7901 Long term (current) use of anticoagulants: Secondary | ICD-10-CM | POA: Diagnosis not present

## 2022-05-07 DIAGNOSIS — I10 Essential (primary) hypertension: Secondary | ICD-10-CM | POA: Diagnosis not present

## 2022-05-07 LAB — CMP (CANCER CENTER ONLY)
ALT: 6 U/L (ref 0–44)
AST: 11 U/L — ABNORMAL LOW (ref 15–41)
Albumin: 4.3 g/dL (ref 3.5–5.0)
Alkaline Phosphatase: 119 U/L (ref 38–126)
Anion gap: 9 (ref 5–15)
BUN: 15 mg/dL (ref 8–23)
CO2: 26 mmol/L (ref 22–32)
Calcium: 10.1 mg/dL (ref 8.9–10.3)
Chloride: 104 mmol/L (ref 98–111)
Creatinine: 0.92 mg/dL (ref 0.44–1.00)
GFR, Estimated: >60 mL/min (ref >60)
Glucose, Bld: 203 mg/dL — ABNORMAL HIGH (ref 70–99)
Potassium: 3.6 mmol/L (ref 3.5–5.1)
Sodium: 139 mmol/L (ref 135–145)
Total Bilirubin: 0.4 mg/dL (ref 0.3–1.2)
Total Protein: 7.7 g/dL (ref 6.5–8.1)

## 2022-05-07 LAB — SAMPLE TO BLOOD BANK

## 2022-05-07 LAB — CBC WITH DIFFERENTIAL (CANCER CENTER ONLY)
Abs Immature Granulocytes: 0.04 10*3/uL (ref 0.00–0.07)
Basophils Absolute: 0 10*3/uL (ref 0.0–0.1)
Basophils Relative: 0 %
Eosinophils Absolute: 0 10*3/uL (ref 0.0–0.5)
Eosinophils Relative: 0 %
HCT: 31.5 % — ABNORMAL LOW (ref 36.0–46.0)
Hemoglobin: 10.5 g/dL — ABNORMAL LOW (ref 12.0–15.0)
Immature Granulocytes: 1 %
Lymphocytes Relative: 15 %
Lymphs Abs: 1.2 10*3/uL (ref 0.7–4.0)
MCH: 28.2 pg (ref 26.0–34.0)
MCHC: 33.3 g/dL (ref 30.0–36.0)
MCV: 84.7 fL (ref 80.0–100.0)
Monocytes Absolute: 0.2 10*3/uL (ref 0.1–1.0)
Monocytes Relative: 2 %
Neutro Abs: 6.5 10*3/uL (ref 1.7–7.7)
Neutrophils Relative %: 82 %
Platelet Count: 321 10*3/uL (ref 150–400)
RBC: 3.72 MIL/uL — ABNORMAL LOW (ref 3.87–5.11)
RDW: 17.4 % — ABNORMAL HIGH (ref 11.5–15.5)
WBC Count: 7.9 10*3/uL (ref 4.0–10.5)
nRBC: 0 % (ref 0.0–0.2)

## 2022-05-07 LAB — MAGNESIUM: Magnesium: 1.5 mg/dL — ABNORMAL LOW (ref 1.7–2.4)

## 2022-05-07 MED ORDER — SODIUM CHLORIDE 0.9 % IV SOLN
10.0000 mg | Freq: Once | INTRAVENOUS | Status: AC
  Administered 2022-05-07: 10 mg via INTRAVENOUS
  Filled 2022-05-07: qty 10

## 2022-05-07 MED ORDER — DIPHENHYDRAMINE HCL 50 MG/ML IJ SOLN
25.0000 mg | Freq: Once | INTRAMUSCULAR | Status: AC
  Administered 2022-05-07: 25 mg via INTRAVENOUS
  Filled 2022-05-07: qty 1

## 2022-05-07 MED ORDER — SODIUM CHLORIDE 0.9 % IV SOLN
175.0000 mg/m2 | Freq: Once | INTRAVENOUS | Status: AC
  Administered 2022-05-07: 324 mg via INTRAVENOUS
  Filled 2022-05-07: qty 54

## 2022-05-07 MED ORDER — SODIUM CHLORIDE 0.9% FLUSH
10.0000 mL | INTRAVENOUS | Status: DC | PRN
  Administered 2022-05-07: 10 mL

## 2022-05-07 MED ORDER — FAMOTIDINE IN NACL 20-0.9 MG/50ML-% IV SOLN
20.0000 mg | Freq: Once | INTRAVENOUS | Status: AC
  Administered 2022-05-07: 20 mg via INTRAVENOUS
  Filled 2022-05-07: qty 50

## 2022-05-07 MED ORDER — SODIUM CHLORIDE 0.9 % IV SOLN
460.0000 mg | Freq: Once | INTRAVENOUS | Status: AC
  Administered 2022-05-07: 460 mg via INTRAVENOUS
  Filled 2022-05-07: qty 46

## 2022-05-07 MED ORDER — SODIUM CHLORIDE 0.9 % IV SOLN: Freq: Once | INTRAVENOUS | Status: AC

## 2022-05-07 MED ORDER — TRAMADOL HCL 50 MG PO TABS
50.0000 mg | ORAL_TABLET | Freq: Four times a day (QID) | ORAL | 0 refills | Status: DC | PRN
  Filled 2022-05-07: qty 30, 8d supply, fill #0

## 2022-05-07 MED ORDER — SODIUM CHLORIDE 0.9% FLUSH
10.0000 mL | Freq: Once | INTRAVENOUS | Status: AC
  Administered 2022-05-07: 10 mL

## 2022-05-07 MED ORDER — SODIUM CHLORIDE 0.9 % IV SOLN
150.0000 mg | Freq: Once | INTRAVENOUS | Status: AC
  Administered 2022-05-07: 150 mg via INTRAVENOUS
  Filled 2022-05-07: qty 150

## 2022-05-07 MED ORDER — HEPARIN SOD (PORK) LOCK FLUSH 100 UNIT/ML IV SOLN
500.0000 [IU] | Freq: Once | INTRAVENOUS | Status: AC | PRN
  Administered 2022-05-07: 500 [IU]

## 2022-05-07 MED ORDER — PALONOSETRON HCL INJECTION 0.25 MG/5ML
0.2500 mg | Freq: Once | INTRAVENOUS | Status: AC
  Administered 2022-05-07: 0.25 mg via INTRAVENOUS
  Filled 2022-05-07: qty 5

## 2022-05-07 NOTE — Assessment & Plan Note (Signed)
She is healing well and overall had excellent response to neoadjuvant chemotherapy We will resume treatment for 3 more cycles

## 2022-05-07 NOTE — Progress Notes (Signed)
[facility]ri Gill OFFICE PROGRESS NOTE  Patient Care Team: Sherri Pou, MD as PCP - General (Internal Medicine) Sherri Pickett Nadean Corwin, MD as PCP - Cardiology (Cardiology) Sherri Killian, RN as [facility]  ASSESSMENT & PLAN:  Uterine cancer Sherri Gill) She is healing well and overall had excellent response to neoadjuvant chemotherapy We will resume treatment for 3 more cycles   Iron deficiency anemia She has multifactorial anemia, history of iron deficiency, anemia secondary to chemotherapy and recent postop bleeding Her blood count is much improved Observe only  New onset atrial fibrillation (Sherri Gill) She had recent diagnosis of intermittent atrial fibrillation She is currently on anticoagulation therapy, complicated by mild intermittent vaginal bleeding Observe only She will continue medical management  No orders of the defined types were placed in this encounter.   All questions were answered. The patient knows to call the clinic with any problems, questions or concerns. The total time spent in the appointment was 30 minutes encounter with patients including review of chart and various tests results, discussions about plan of care and coordination of care plan   Sherri Lark, MD 05/07/2022 9:48 AM  INTERVAL HISTORY: Please see below for problem oriented charting. she returns for treatment follow-up and resume chemotherapy after recent surgery Her wound is healing well She has intermittent vaginal bleeding, resolving She has no pain  REVIEW OF SYSTEMS:   Constitutional: Denies fevers, chills or abnormal weight loss Eyes: Denies blurriness of vision Ears, nose, mouth, throat, and face: Denies mucositis or sore throat Respiratory: Denies cough, dyspnea or wheezes Cardiovascular: Denies palpitation, chest discomfort or lower extremity swelling Gastrointestinal:  Denies nausea, heartburn or change in bowel habits Skin: Denies abnormal skin  rashes Lymphatics: Denies new lymphadenopathy or easy bruising Neurological:Denies numbness, tingling or new weaknesses Behavioral/Psych: Mood is stable, no new changes  All other systems were reviewed with the patient and are negative.  I have reviewed the past medical history, past surgical history, social history and family history with the patient and they are unchanged from previous note.  ALLERGIES:  is allergic to codeine sulfate, penicillins, percocet [oxycodone-acetaminophen], and zestril [lisinopril].  MEDICATIONS:  Current Outpatient Medications  Medication Sig Dispense Refill   acetaminophen (TYLENOL) 500 MG tablet Take 1,000 mg by mouth every 6 (six) hours as needed for mild pain.     albuterol (VENTOLIN HFA) 108 (90 Base) MCG/ACT inhaler Inhale 2 puffs into the lungs every 4 (four) hours as needed for wheezing or shortness of breath. 8 g 0   atorvastatin (LIPITOR) 20 MG tablet Take 1 tablet (20 mg total) by mouth at bedtime. 30 tablet 11   B Complex-C (B-COMPLEX WITH VITAMIN C) tablet Take 1 tablet by mouth in the morning.     Blood Glucose Monitoring Suppl (ONE TOUCH ULTRA MINI) w/Device KIT Please use as directed. 1 each 0   Cholecalciferol 1000 units capsule Take 1 capsule (1,000 Units total) by mouth daily. 90 capsule 1   dexamethasone (DECADRON) 4 MG tablet Take 2 tablets by mouth the night before and 2 tablets the morning of chemotherapy, every 3 weeks for 6 cycles 36 tablet 6   diltiazem (CARDIZEM CD) 120 MG 24 hr capsule Take 1 capsule (120 mg total) by mouth daily. 30 capsule 2   dorzolamide-timolol (COSOPT) 22.3-6.8 MG/ML ophthalmic solution Place 1 drop into both eyes 2 (two) times daily.     glucose blood (ONE TOUCH ULTRA TEST) test strip Use as instructed 100 each 1  Insulin Pen Needle 32G X 4 MM MISC 1 each by Does not apply route daily. 100 each 3   latanoprost (XALATAN) 0.005 % ophthalmic solution Place 1 drop into both eyes at bedtime.     lidocaine-prilocaine  (EMLA) cream Apply to affected area once 30 g 3   liraglutide (VICTOZA) 18 MG/3ML SOPN PLEASE START TAKING VICTOZA 0.6MG DAILY WEEK 1, THEN TAKE 1.2MG DAILY WEEK 2, AND THEN TAKE 1.8MG DAILY THEREAFTER (Patient taking differently: Inject 1.2 mg into the skin daily.) 9 mL 3   losartan (COZAAR) 100 MG tablet Take 100 mg by mouth at bedtime.     magnesium oxide (MAG-OX) 400 (240 Mg) MG tablet Take 1 tablet by mouth 2 times daily. 60 tablet 1   metFORMIN (GLUCOPHAGE) 1000 MG tablet Take 1,000 mg by mouth 2 (two) times daily.     ondansetron (ZOFRAN) 8 MG tablet Take 1 tablet (8 mg total) by mouth 2 (two) times daily as needed. Start on the third day after chemotherapy. 30 tablet 1   ONETOUCH DELICA LANCETS 40J MISC Please use as directed. 100 each 0   pantoprazole (PROTONIX) 40 MG tablet Take 1 tablet by mouth daily. 30 tablet 1   polyethylene glycol powder (GLYCOLAX/MIRALAX) 17 GM/SCOOP powder Take 17 g by mouth daily. 238 g 1   prochlorperazine (COMPAZINE) 10 MG tablet Take 1 tablet (10 mg total) by mouth every 6 (six) hours as needed (Nausea or vomiting). 30 tablet 1   rivaroxaban (XARELTO) 20 MG TABS tablet Take 1 tablet (20 mg total) by mouth daily with supper. 30 tablet 1   traMADol (ULTRAM) 50 MG tablet Take 1 tablet (50 mg total) by mouth every 6 (six) hours as needed for severe pain. 30 tablet 0   No current facility-administered medications for this visit.    SUMMARY OF ONCOLOGIC HISTORY: Oncology History Overview Note  High grade serous, Her2/neu neg, MMR normal, MSI stable   Uterine cancer ([place])  10/12/2021 Imaging   US pelvis  1. Heterogeneous endometrial thickening with appearance of moderate complex fluid in the endometrial canal, possible hemorrhagic material. Endometrial thickness is considered abnormal for an asymptomatic post-menopausal female. Endometrial sampling should be considered to exclude carcinoma. 2. Nonvisualized ovary 3. Heterogeneous thick-walled urinary bladder,  correlate for cystitis.   12/28/2021 Pathology Results   CYTOLOGY - NON PAP  CASE: [id number]  PATIENT: Sherri Gill  Non-Gynecological Cytology Report   Clinical History: Abnormal pelvic US, highly suspicious of malignant ascites  Specimen Submitted:  A. ASCITES, PARACENTESIS:    FINAL MICROSCOPIC DIAGNOSIS:  - Malignant cells consistent with adenocarcinoma  - See comment   SPECIMEN ADEQUACY:  Satisfactory for evaluation   DIAGNOSTIC COMMENTS:  Immunohistochemical stains show that the tumor cells are positive for CK7 and PAX8 while they are negative for CK20, CDX2 and ER, consistent with above interpretation.  Additionally, the tumor cells are diffusely positive for p53 (clonal overexpression), suggestive of a high-grade serous carcinoma.    12/28/2021 Imaging   US pelvis 1. Persistent abnormal appearance of the endometrium, heterogeneously thickened and complex with possible intraluminal fluid. This is similar to that seen on December 2022 ultrasound. Endometrial thickness is considered abnormal for an asymptomatic post-menopausal female. Endometrial sampling should be considered to exclude carcinoma. 2. Nonvisualization of the ovaries.   12/28/2021 Imaging   1. Large volume ascites. 2. Small umbilical hernia containing fat and ascites. 3. Haziness of anterior omentum most likely related to ascites, other etiologies such as metastatic disease can not be  excluded. 4. Diffuse colonic diverticulosis without evidence for diverticulitis.   12/29/2021 Procedure   Successful ultrasound-guided paracentesis yielding 2.8 liters of peritoneal fluid.   01/14/2022 Initial Diagnosis   Uterine cancer ([place])   01/14/2022 Cancer Staging   Staging form: Corpus Uteri - Carcinoma and Carcinosarcoma, AJCC 8th Edition - Clinical: Stage III (cT3, cN0, cM0) - Signed by Sherri Lark, MD on 01/14/2022 Stage prefix: Initial diagnosis   01/15/2022 Surgery   Surgery: Cervical dilation under ultrasound  guidance, hysteroscopy, endometrial sampling using the Myosure   Surgeons:  Valarie Cones MD    Pathology: endometrial curetteings   Operative findings: On EUA, 8 cm mobile uterus, some nodularity along cul de sac. Cervix normal in appearance without discernible external os. Difficulty noted in dilating the cervix after using scalpel to incise in area of what what thought to be the external os. Under ultrasound guidance, cervix was dilated with confirmation on imaging of intra-uterine placement of dilators. On hysteroscopy, abnormal appearing endocervix and very calcified tissue obscuring good visualization of endometrium concerning for replacement by tumor.    01/15/2022 Pathology Results   FINAL MICROSCOPIC DIAGNOSIS:   A.   ENDOMETRIUM, CURETTAGE:  -    Serous endometrial carcinoma.   COMMENT:   The carcinoma is strongly and diffusely positive for p53 and diffuse p16 positivity.  The ER shows relatively diffuse positivity.  Overall, the histologic features (slit-like glandular spaces and psammoma bodies) and the immunohistochemical phenotype are most characteristic for a serous carcinoma.    01/21/2022 Procedure   Placement of single lumen port a cath via right internal jugular vein. The catheter tip lies at the cavo-atrial junction. A power injectable port a cath was placed and is ready for immediate use.     01/25/2022 -  Chemotherapy   Patient is on Treatment Plan : UTERINE Carboplatin AUC 6 / Paclitaxel q21d     03/21/2022 Imaging   1. Near complete resolution of previously noted ascites, with only small volume residual perihepatic ascites. 2. Improved omental caking and stranding in the ventral abdomen. 3. Findings are consistent with treatment response of peritoneal metastatic disease. 4. Thickening of the urinary bladder wall , consistent with nonspecific infectious or inflammatory cystitis. Correlate with urinalysis.   Aortic Atherosclerosis (ICD10-I70.0).       03/22/2022  Tumor Marker   Patient's tumor was tested for the following markers: CA-125. Results of the tumor marker test revealed 21.5.   04/09/2022 Surgery     Surgery: Diagnostic laparoscopy, conversion to exploratory laparotomy, partial omentectomy, lysis of adhesions for approximately 60 minutes, total abdominal hysterectomy, bilateral salpingo-oophorectomy, excision of ileal tumor implant and excision of transverse colon epiploica tumor implant   04/09/2022 Pathology Results   A.   OMENTUM, RESECTION:  -    Metastatic serous carcinoma.   B.   ILEAL TUMOR IMPLANTS, EXCISION:  -    Adhesions with small focus of metastatic serous carcinoma, possibly  intralymphatic.   C.   UTERUS, CERVIX, BILATERAL FALLOPIAN TUBES AND OVARIES:  -    Serous carcinoma, endometrial, invasive into outer half of uterus,  with involvement of  cervical stroma, uterine serosa, ovaries and fallopian tubes.   -    Negative for cervical dysplasia.  -    Leiomyomas, largest 1.2 cm in greatest dimension.   D.   COLON, MESENTARY NODULE, TRANSVERSE, EXCISION:  -    Metastatic serous carcinoma     PHYSICAL EXAMINATION: ECOG PERFORMANCE STATUS: 1 - Symptomatic but completely ambulatory  Vitals:  05/07/22 0917  BP: (!) 147/62  Pulse: 96  Resp: 18  Temp: 98.6 F (37 C)  SpO2: 100%   Filed Weights   05/07/22 0917  Weight: 169 lb 9.6 oz (76.9 kg)    GENERAL:alert, no distress and comfortable SKIN: skin color, texture, turgor are normal, no rashes or significant lesions EYES: normal, Conjunctiva are pink and non-injected, sclera clear OROPHARYNX:no exudate, no erythema and lips, buccal mucosa, and tongue normal  NECK: supple, thyroid normal size, non-tender, without nodularity LYMPH:  no palpable lymphadenopathy in the cervical, axillary or inguinal LUNGS: clear to auscultation and percussion with normal breathing effort HEART: regular rate & rhythm and no murmurs and no lower extremity edema ABDOMEN:abdomen  soft, non-tender and normal bowel sounds.  Noted well-healed surgical Musculoskeletal:no cyanosis of digits and no clubbing  NEURO: alert & oriented x 3 with fluent speech, no focal motor/sensory deficits  LABORATORY DATA:  I have reviewed the data as listed    Component Value Date/Time   NA 139 05/07/2022 0853   NA 139 01/10/2022 1644   K 3.6 05/07/2022 0853   CL 104 05/07/2022 0853   CO2 26 05/07/2022 0853   GLUCOSE 203 (H) 05/07/2022 0853   GLUCOSE 93 09/10/2006 0834   BUN 15 05/07/2022 0853   BUN 13 01/10/2022 1644   CREATININE 0.92 05/07/2022 0853   CREATININE 0.64 07/13/2014 1621   CALCIUM 10.1 05/07/2022 0853   CALCIUM 9.7 03/29/2010 1554   PROT 7.7 05/07/2022 0853   PROT 7.6 11/09/2020 1156   ALBUMIN 4.3 05/07/2022 0853   ALBUMIN 4.4 11/09/2020 1156   AST 11 (L) 05/07/2022 0853   ALT 6 05/07/2022 0853   ALKPHOS 119 05/07/2022 0853   BILITOT 0.4 05/07/2022 0853   GFRNONAA >60 05/07/2022 0853   GFRNONAA >89 07/13/2014 1621   GFRAA 86 11/09/2020 1156   GFRAA >89 07/13/2014 1621    No results found for: "SPEP", "UPEP"  Lab Results  Component Value Date   WBC 7.9 05/07/2022   NEUTROABS 6.5 05/07/2022   HGB 10.5 (L) 05/07/2022   HCT 31.5 (L) 05/07/2022   MCV 84.7 05/07/2022   PLT 321 05/07/2022      Chemistry      Component Value Date/Time   NA 139 05/07/2022 0853   NA 139 01/10/2022 1644   K 3.6 05/07/2022 0853   CL 104 05/07/2022 0853   CO2 26 05/07/2022 0853   BUN 15 05/07/2022 0853   BUN 13 01/10/2022 1644   CREATININE 0.92 05/07/2022 0853   CREATININE 0.64 07/13/2014 1621      Component Value Date/Time   CALCIUM 10.1 05/07/2022 0853   CALCIUM 9.7 03/29/2010 1554   ALKPHOS 119 05/07/2022 0853   AST 11 (L) 05/07/2022 0853   ALT 6 05/07/2022 0853   BILITOT 0.4 05/07/2022 0853       RADIOGRAPHIC STUDIES: I have personally reviewed the radiological images as listed and agreed with the findings in the report. DG Chest 2 View  Result Date:  04/13/2022 CLINICAL DATA:  Leukocytosis.  Recent abdominal surgery. EXAM: CHEST - 2 VIEW COMPARISON:  12/30/2021 and prior studies FINDINGS: Cardiomediastinal silhouette is unchanged. A RIGHT Port-A-Cath is present with tip overlying the UPPER RIGHT atrium. This is a low volume study with peribronchial thickening bilaterally noted. No definite airspace disease, pleural effusion or pneumothorax noted. No significant abnormalities identified in the visualized UPPER abdomen. There is no evidence of gross pneumoperitoneum. IMPRESSION: Low volume study with peribronchial thickening bilaterally. No definite airspace  disease. Electronically Signed   By: Margarette Canada M.D.   On: 04/13/2022 12:37   ECHOCARDIOGRAM COMPLETE  Result Date: 04/12/2022    ECHOCARDIOGRAM REPORT   Patient Name:   LISETH WANN Date of Exam: 04/12/2022 Medical Rec #:  [id number]        Height:       63.0 in Accession #:    [id number]       Weight:       174.2 lb Date of Birth:  06/07/1951         BSA:          1.823 m Patient Age:    71 years         BP:           132/71 mmHg Patient Gender: F                HR:           128 bpm. Exam Location:  Inpatient Procedure: 2D Echo, Cardiac Doppler and Color Doppler Indications:    Atrial Fibrillation I48.91  History:        Patient has no prior history of Echocardiogram examinations.                 Risk Factors:Hypertension and Diabetes. Anemia. GERD. New onset                 of Afib post surgery.  Sonographer:    Darlina Sicilian RDCS Referring Phys: [id number] DAVID MANUEL [place]  1. Left ventricular ejection fraction, by estimation, is 60 to 65%. The left ventricle has normal function. The left ventricle has no regional wall motion abnormalities. Left ventricular diastolic parameters are indeterminate.  2. Right ventricular systolic function is normal. The right ventricular size is normal. There is moderately elevated pulmonary artery systolic pressure.  3. The pericardial effusion is  posterior to the left ventricle.  4. The mitral valve is normal in structure. No evidence of mitral valve regurgitation. No evidence of mitral stenosis.  5. The aortic valve is normal in structure. Aortic valve regurgitation is not visualized. No aortic stenosis is present.  6. The inferior vena cava is normal in size with greater than 50% respiratory variability, suggesting right atrial pressure of 3 mmHg. FINDINGS  Left Ventricle: Left ventricular ejection fraction, by estimation, is 60 to 65%. The left ventricle has normal function. The left ventricle has no regional wall motion abnormalities. The left ventricular internal cavity size was normal in size. There is  no left ventricular hypertrophy. Left ventricular diastolic parameters are indeterminate. Right Ventricle: The right ventricular size is normal. No increase in right ventricular wall thickness. Right ventricular systolic function is normal. There is moderately elevated pulmonary artery systolic pressure. The tricuspid regurgitant velocity is 3.44 m/s, and with an assumed right atrial pressure of 3 mmHg, the estimated right ventricular systolic pressure is 14.4 mmHg. Left Atrium: Left atrial size was normal in size. Right Atrium: Right atrial size was normal in size. Pericardium: Trivial pericardial effusion is present. The pericardial effusion is posterior to the left ventricle. Mitral Valve: The mitral valve is normal in structure. No evidence of mitral valve regurgitation. No evidence of mitral valve stenosis. Tricuspid Valve: The tricuspid valve is normal in structure. Tricuspid valve regurgitation is mild . No evidence of tricuspid stenosis. Aortic Valve: The aortic valve is normal in structure. Aortic valve regurgitation is not visualized. No aortic stenosis is present. Pulmonic Valve: The pulmonic valve was normal  in structure. Pulmonic valve regurgitation is not visualized. No evidence of pulmonic stenosis. Aorta: The aortic root is normal in  size and structure. Venous: The inferior vena cava is normal in size with greater than 50% respiratory variability, suggesting right atrial pressure of 3 mmHg. IAS/Shunts: No atrial level shunt detected by color flow Doppler.  LEFT VENTRICLE PLAX 2D LVIDd:         4.30 cm   Diastology LVIDs:         2.20 cm   LV e' medial:    7.94 cm/s LV PW:         0.90 cm   LV E/e' medial:  9.6 LV IVS:        0.90 cm   LV e' lateral:   9.57 cm/s LVOT diam:     1.70 cm   LV E/e' lateral: 8.0 LV SV:         52 LV SV Index:   29 LVOT Area:     2.27 cm  RIGHT VENTRICLE RV S prime:     18.00 cm/s TAPSE (M-mode): 2.3 cm LEFT ATRIUM             Index LA diam:        3.70 cm 2.03 cm/m LA Vol (A2C):   38.5 ml 21.12 ml/m LA Vol (A4C):   29.7 ml 16.29 ml/m LA Biplane Vol: 34.2 ml 18.76 ml/m  AORTIC VALVE LVOT Vmax:   125.00 cm/s LVOT Vmean:  77.800 cm/s LVOT VTI:    0.230 m  AORTA Ao Root diam: 2.40 cm Ao Asc diam:  2.70 cm MITRAL VALVE               TRICUSPID VALVE MV Area (PHT): 4.15 cm    TR Peak grad:   47.3 mmHg MV Decel Time: 183 msec    TR Vmax:        344.00 cm/s MV E velocity: 76.30 cm/s MV A velocity: 72.40 cm/s  SHUNTS MV E/A ratio:  1.05        Systemic VTI:  0.23 m                            Systemic Diam: 1.70 cm Jenkins Rouge MD Electronically signed by Jenkins Rouge MD Signature Date/Time: 04/12/2022/2:18:28 PM    Final    DG Abd 1 View  Result Date: 04/09/2022 CLINICAL DATA:  A 71 year old female presents for evaluation of the abdomen following surgery with incorrect instrument count. EXAM: ABDOMEN - 1 VIEW COMPARISON:  July 18, 2016 and CT of the abdomen and pelvis from May twenty-third 2023. FINDINGS: Surgical instrument is directed over the symphysis pubis with spine show markers in place, likely in the vagina based on position. A gastric tube is in place tip in the distal stomach. A surgical clip projects over the LEFT hemi sacrum. No unexpected radiopaque foreign bodies are demonstrated upon discussion with  the surgical team, Dr. Delsa Sale, through operating room staff, Lonn Georgia. Study mildly limited by patient body habitus. On limited assessment there is no acute skeletal finding. Bowel gas pattern is unremarkable. Central venous access device terminates in the mid RIGHT atrium. IMPRESSION: Surgical implement in the vagina with sponges in place and vascular clip over the LEFT pelvis, no unexpected radiopaque foreign bodies, confirmed with operating room staff as outlined above. Electronically Signed   By: Zetta Bills M.D.   On: 04/09/2022 12:30

## 2022-05-07 NOTE — Assessment & Plan Note (Signed)
She had recent diagnosis of intermittent atrial fibrillation She is currently on anticoagulation therapy, complicated by mild intermittent vaginal bleeding Observe only She will continue medical management

## 2022-05-07 NOTE — Assessment & Plan Note (Signed)
She has multifactorial anemia, history of iron deficiency, anemia secondary to chemotherapy and recent postop bleeding Her blood count is much improved Observe only

## 2022-05-07 NOTE — Patient Instructions (Signed)
[facility] MEDICAL ONCOLOGY  Discharge Instructions: Thank you for choosing [facility] to provide your oncology and hematology care.   If you have a lab appointment with the Cancer Center, please go directly to the Cancer Center and check in at the registration area.   Wear comfortable clothing and clothing appropriate for easy access to any Portacath or PICC line.   We strive to give you quality time with your provider. You may need to reschedule your appointment if you arrive late (15 or more minutes).  Arriving late affects you and other patients whose appointments are after yours.  Also, if you miss three or more appointments without notifying the office, you may be dismissed from the clinic at the provider's discretion.      For prescription refill requests, have your pharmacy contact our office and allow 72 hours for refills to be completed.    Today you received the following chemotherapy and/or immunotherapy agents: Paclitaxel, Carboplatin.       To help prevent nausea and vomiting after your treatment, we encourage you to take your nausea medication as directed.  BELOW ARE SYMPTOMS THAT SHOULD BE REPORTED IMMEDIATELY: *FEVER GREATER THAN 100.4 F (38 C) OR HIGHER *CHILLS OR SWEATING *NAUSEA AND VOMITING THAT IS NOT CONTROLLED WITH YOUR NAUSEA MEDICATION *UNUSUAL SHORTNESS OF BREATH *UNUSUAL BRUISING OR BLEEDING *URINARY PROBLEMS (pain or burning when urinating, or frequent urination) *BOWEL PROBLEMS (unusual diarrhea, constipation, pain near the anus) TENDERNESS IN MOUTH AND THROAT WITH OR WITHOUT PRESENCE OF ULCERS (sore throat, sores in mouth, or a toothache) UNUSUAL RASH, SWELLING OR PAIN  UNUSUAL VAGINAL DISCHARGE OR ITCHING   Items with * indicate a potential emergency and should be followed up as soon as possible or go to the Emergency Department if any problems should occur.  Please show the CHEMOTHERAPY ALERT CARD or IMMUNOTHERAPY ALERT CARD  at check-in to the Emergency Department and triage nurse.  Should you have questions after your visit or need to cancel or reschedule your appointment, please contact [facility] MEDICAL ONCOLOGY  Dept: [phone]  and follow the prompts.  Office hours are 8:00 a.m. to 4:30 p.m. Monday - Friday. Please note that voicemails left after 4:00 p.m. may not be returned until the following business day.  We are closed weekends and major holidays. You have access to a nurse at all times for urgent questions. Please call the main number to the clinic Dept: [phone] and follow the prompts.   For any non-urgent questions, you may also contact your provider using MyChart. We now offer e-Visits for anyone 18 and older to request care online for non-urgent symptoms. For details visit [email or web address].   Also download the MyChart app! Go to the app store, search "MyChart", open the app, select [facility], and log in with your MyChart username and password.  Masks are optional in the cancer centers. If you would like for your care team to wear a mask while they are taking care of you, please let them know. For doctor visits, patients may have with them one support person who is at least 69 years old. At this time, visitors are not allowed in the infusion area. 

## 2022-05-08 LAB — CA 125: Cancer Antigen (CA) 125: 12.1 U/mL (ref 0.0–38.1)

## 2022-05-09 ENCOUNTER — Other Ambulatory Visit (HOSPITAL_COMMUNITY): Payer: Self-pay

## 2022-05-09 ENCOUNTER — Telehealth: Payer: Self-pay | Admitting: *Deleted

## 2022-05-09 ENCOUNTER — Ambulatory Visit (INDEPENDENT_AMBULATORY_CARE_PROVIDER_SITE_OTHER): Payer: Medicare HMO | Admitting: Student

## 2022-05-09 DIAGNOSIS — C579 Malignant neoplasm of female genital organ, unspecified: Secondary | ICD-10-CM | POA: Diagnosis not present

## 2022-05-09 DIAGNOSIS — E1149 Type 2 diabetes mellitus with other diabetic neurological complication: Secondary | ICD-10-CM

## 2022-05-09 MED ORDER — METFORMIN HCL 1000 MG PO TABS
1000.0000 mg | ORAL_TABLET | Freq: Two times a day (BID) | ORAL | 3 refills | Status: DC
  Filled 2022-05-09: qty 120, 60d supply, fill #0

## 2022-05-09 NOTE — Progress Notes (Signed)
I connected with  Sherri Gill on 05/09/22 by a video enabled telemedicine application and verified that I am speaking with the correct person using two identifiers.   I discussed the limitations of evaluation and management by telemedicine. The patient expressed understanding and agreed to proceed.  [name] took the call from her home and I was located at the [facility].  She has been doing very well with her cancer treatments.  Currently recv'ing chemo every 3 wks, and she has been experiencing leg pain after treatments.  Taking ibuprofen and tramadol.  She is getting out of the house to walk and is remaining active, though she hasn't returned to work.  No acute problems to address by phone.  She will be scheduling a routine appt in person with me at my next available. Due for bp, dm, lipid f/u.  No changes in therapy for chronic conditions today.  4:50p-5:20p spent on the call.

## 2022-05-09 NOTE — Telephone Encounter (Signed)
Per Dr. Alvy Bimler, returned pt phone call for request for medication for sleep/anxiety. Advised pt that appt she scheduled for today with internal medicine to discuss meds for sleep/anxiety can be handled during that visit and Dr.Gorsuch can not prescribe medication if a different provider is seeing her for the same medication which she is requesting. Pt verbalized understanding.

## 2022-05-09 NOTE — Progress Notes (Signed)
   I connected with  Sherri Gill on 05/09/22 by a video enabled telemedicine application and verified that I am speaking with the correct person using two identifiers.   I discussed the limitations of evaluation and management by telemedicine. The patient expressed understanding and agreed to proceed.  Sherri Gill took the call from her home and I was located at the [facility].  She has been doing very well with her cancer treatments.  Currently recv'ing chemo every 3 wks, and she has been experiencing leg pain after treatments.  Taking ibuprofen and tramadol.  She is getting out of the house to walk and is remaining active, though she hasn't returned to work.  No acute problems to address by phone.  She will be scheduling a routine appt I person with me at my next available. Due for bp, dm, lipid f/u.  No changes in therapy for chronic conditions today.  4:50p-5:20p spent on the call.

## 2022-05-14 ENCOUNTER — Other Ambulatory Visit (HOSPITAL_COMMUNITY): Payer: Self-pay

## 2022-05-15 ENCOUNTER — Telehealth: Payer: Self-pay

## 2022-05-15 ENCOUNTER — Other Ambulatory Visit (HOSPITAL_COMMUNITY): Payer: Self-pay

## 2022-05-15 NOTE — Telephone Encounter (Signed)
Attempted to call patient back regarding request for a medication to help with her anxiety and depression.  No answer on number provided for callback. Unable to leave voicemail. Will try again at later time.

## 2022-05-16 ENCOUNTER — Telehealth: Payer: Self-pay

## 2022-05-16 ENCOUNTER — Inpatient Hospital Stay: Payer: Medicare HMO

## 2022-05-16 ENCOUNTER — Other Ambulatory Visit: Payer: Self-pay

## 2022-05-16 DIAGNOSIS — Z87891 Personal history of nicotine dependence: Secondary | ICD-10-CM | POA: Diagnosis not present

## 2022-05-16 DIAGNOSIS — Z7952 Long term (current) use of systemic steroids: Secondary | ICD-10-CM | POA: Diagnosis not present

## 2022-05-16 DIAGNOSIS — Z880 Allergy status to penicillin: Secondary | ICD-10-CM | POA: Diagnosis not present

## 2022-05-16 DIAGNOSIS — R3915 Urgency of urination: Secondary | ICD-10-CM

## 2022-05-16 DIAGNOSIS — R188 Other ascites: Secondary | ICD-10-CM | POA: Diagnosis not present

## 2022-05-16 DIAGNOSIS — D509 Iron deficiency anemia, unspecified: Secondary | ICD-10-CM | POA: Diagnosis not present

## 2022-05-16 DIAGNOSIS — Z5986 Financial insecurity: Secondary | ICD-10-CM | POA: Diagnosis not present

## 2022-05-16 DIAGNOSIS — Z5111 Encounter for antineoplastic chemotherapy: Secondary | ICD-10-CM | POA: Diagnosis not present

## 2022-05-16 DIAGNOSIS — Z7984 Long term (current) use of oral hypoglycemic drugs: Secondary | ICD-10-CM | POA: Diagnosis not present

## 2022-05-16 DIAGNOSIS — R971 Elevated cancer antigen 125 [CA 125]: Secondary | ICD-10-CM | POA: Diagnosis not present

## 2022-05-16 DIAGNOSIS — Z90710 Acquired absence of both cervix and uterus: Secondary | ICD-10-CM | POA: Diagnosis not present

## 2022-05-16 DIAGNOSIS — K219 Gastro-esophageal reflux disease without esophagitis: Secondary | ICD-10-CM | POA: Diagnosis not present

## 2022-05-16 DIAGNOSIS — E119 Type 2 diabetes mellitus without complications: Secondary | ICD-10-CM | POA: Diagnosis not present

## 2022-05-16 DIAGNOSIS — E669 Obesity, unspecified: Secondary | ICD-10-CM | POA: Diagnosis not present

## 2022-05-16 DIAGNOSIS — C55 Malignant neoplasm of uterus, part unspecified: Secondary | ICD-10-CM | POA: Diagnosis not present

## 2022-05-16 DIAGNOSIS — D72829 Elevated white blood cell count, unspecified: Secondary | ICD-10-CM | POA: Diagnosis not present

## 2022-05-16 DIAGNOSIS — Z885 Allergy status to narcotic agent status: Secondary | ICD-10-CM | POA: Diagnosis not present

## 2022-05-16 DIAGNOSIS — I480 Paroxysmal atrial fibrillation: Secondary | ICD-10-CM | POA: Diagnosis not present

## 2022-05-16 DIAGNOSIS — Z888 Allergy status to other drugs, medicaments and biological substances status: Secondary | ICD-10-CM | POA: Diagnosis not present

## 2022-05-16 DIAGNOSIS — Z79899 Other long term (current) drug therapy: Secondary | ICD-10-CM | POA: Diagnosis not present

## 2022-05-16 DIAGNOSIS — Z7901 Long term (current) use of anticoagulants: Secondary | ICD-10-CM | POA: Diagnosis not present

## 2022-05-16 DIAGNOSIS — K429 Umbilical hernia without obstruction or gangrene: Secondary | ICD-10-CM | POA: Diagnosis not present

## 2022-05-16 DIAGNOSIS — Z9221 Personal history of antineoplastic chemotherapy: Secondary | ICD-10-CM | POA: Diagnosis not present

## 2022-05-16 DIAGNOSIS — I10 Essential (primary) hypertension: Secondary | ICD-10-CM | POA: Diagnosis not present

## 2022-05-16 DIAGNOSIS — I3139 Other pericardial effusion (noninflammatory): Secondary | ICD-10-CM | POA: Diagnosis not present

## 2022-05-16 LAB — URINALYSIS, COMPLETE (UACMP) WITH MICROSCOPIC
Bacteria, UA: NONE SEEN
Bilirubin Urine: NEGATIVE
Glucose, UA: NEGATIVE mg/dL
Ketones, ur: NEGATIVE mg/dL
Nitrite: NEGATIVE
Protein, ur: 30 mg/dL — AB
RBC / HPF: >50 RBC/hpf — ABNORMAL HIGH (ref 0–5)
Specific Gravity, Urine: 1.018 (ref 1.005–1.030)
WBC, UA: >50 WBC/hpf — ABNORMAL HIGH (ref 0–5)
pH: 7 (ref 5.0–8.0)

## 2022-05-16 NOTE — Telephone Encounter (Signed)
Received call from patient endorsing urinary urgency that began Tuesday evening (05/14/22). She reports feeling discomfort and intermittent pain in her low back. She has attempted to increase her water intake but symptoms did not resolve.  She has urgency and pressure and can't hold her urine. She is now wearing pads for this. She denies odor, discharge, blood in urine, fever or chills. " I just don't feel well and need something sent in. I feel sick." Patient feels she is emptying her bladder but has spasms. Lab appointment scheduled for this afternoon at 4:00pm for urinalysis and urine culture. Patient is going to try to make it. Advised patient if she unable to make it she can be seen at an urgent care. Patient verbalized understanding.

## 2022-05-17 ENCOUNTER — Telehealth: Payer: Self-pay

## 2022-05-17 ENCOUNTER — Encounter: Payer: Self-pay | Admitting: General Practice

## 2022-05-17 ENCOUNTER — Other Ambulatory Visit (HOSPITAL_COMMUNITY): Payer: Self-pay

## 2022-05-17 ENCOUNTER — Other Ambulatory Visit: Payer: Self-pay | Admitting: Hematology and Oncology

## 2022-05-17 ENCOUNTER — Encounter: Payer: Self-pay | Admitting: Hematology and Oncology

## 2022-05-17 LAB — URINE CULTURE: Culture: NO GROWTH

## 2022-05-17 MED ORDER — CIPROFLOXACIN HCL 250 MG PO TABS
250.0000 mg | ORAL_TABLET | Freq: Two times a day (BID) | ORAL | 0 refills | Status: DC
  Filled 2022-05-17: qty 6, 3d supply, fill #0

## 2022-05-17 NOTE — Progress Notes (Signed)
[place] Spiritual Care Note  Referred by nursing for emotional support. Reached Ms Sendejo by phone. She was very grateful for opportunity to begin to process some of her stressors, particularly managing her cancer treatment while also being the primary caregiver for her 69yo son, who needs support due to mental illness.  Provided reflective listening, affirmation of feelings, and encouragement. We plan to meet in my office at 11:00 am on Monday (7/24).   [place], [place], [facility] Pager [phone] Voicemail [phone]

## 2022-05-17 NOTE — Telephone Encounter (Signed)
Called pt, informed her of antibiotic called in based on her UA.  Advised pt to take with food and complete full course.  Pt verbalized understanding.  Pt also complains of anxiety and is requesting assistance with her feelings as she is having frequent bouts of tearfulness.  Referral/message previously sent to Lorrin Jackson but I will f/u again today.

## 2022-05-17 NOTE — Telephone Encounter (Signed)
Patient called in to triage asking for me. She had a question about Cardizem then had to disconnect to take another call. She will call back.

## 2022-05-20 ENCOUNTER — Encounter: Payer: Self-pay | Admitting: General Practice

## 2022-05-20 ENCOUNTER — Other Ambulatory Visit: Payer: Self-pay

## 2022-05-20 NOTE — Progress Notes (Signed)
[place] Spiritual Care Note  Met with Sherri Gill in my office as planned for emotional support as she shared and processed family and communication concerns related to her cancer diagnosis and treatment.  Provided empathic listening, normalization of feelings, and affirmation of strengths, including self-reflection and seeking support. Sherri Gill noted that she felt "so much lighter" after having the opportunity to share her story and feelings.  We plan to follow up in person on Monday 8/7 at [place], [place], [facility] Pager [phone] Voicemail [phone]

## 2022-05-23 DIAGNOSIS — H40033 Anatomical narrow angle, bilateral: Secondary | ICD-10-CM | POA: Diagnosis not present

## 2022-05-23 DIAGNOSIS — H401122 Primary open-angle glaucoma, left eye, moderate stage: Secondary | ICD-10-CM | POA: Diagnosis not present

## 2022-05-23 DIAGNOSIS — E119 Type 2 diabetes mellitus without complications: Secondary | ICD-10-CM | POA: Diagnosis not present

## 2022-05-23 DIAGNOSIS — H40051 Ocular hypertension, right eye: Secondary | ICD-10-CM | POA: Diagnosis not present

## 2022-05-23 DIAGNOSIS — H25813 Combined forms of age-related cataract, bilateral: Secondary | ICD-10-CM | POA: Diagnosis not present

## 2022-05-23 LAB — HM DIABETES EYE EXAM

## 2022-05-27 ENCOUNTER — Telehealth: Payer: Self-pay

## 2022-05-27 NOTE — Telephone Encounter (Signed)
Returned patient's call. She wanted to know how to mail in [employer] monitor. Instructions provided to her. She wanted an appointment with Dr. Debara Pickett. Made for 10/18.

## 2022-05-28 ENCOUNTER — Other Ambulatory Visit: Payer: Self-pay

## 2022-05-28 ENCOUNTER — Encounter: Payer: Self-pay | Admitting: Hematology and Oncology

## 2022-05-28 ENCOUNTER — Inpatient Hospital Stay: Payer: Medicare HMO | Attending: Gynecologic Oncology

## 2022-05-28 ENCOUNTER — Inpatient Hospital Stay: Payer: Medicare HMO | Admitting: Hematology and Oncology

## 2022-05-28 DIAGNOSIS — M898X9 Other specified disorders of bone, unspecified site: Secondary | ICD-10-CM | POA: Insufficient documentation

## 2022-05-28 DIAGNOSIS — D6481 Anemia due to antineoplastic chemotherapy: Secondary | ICD-10-CM | POA: Insufficient documentation

## 2022-05-28 DIAGNOSIS — M791 Myalgia, unspecified site: Secondary | ICD-10-CM | POA: Insufficient documentation

## 2022-05-28 DIAGNOSIS — T451X5A Adverse effect of antineoplastic and immunosuppressive drugs, initial encounter: Secondary | ICD-10-CM | POA: Diagnosis not present

## 2022-05-28 DIAGNOSIS — Z7901 Long term (current) use of anticoagulants: Secondary | ICD-10-CM | POA: Diagnosis not present

## 2022-05-28 DIAGNOSIS — Z880 Allergy status to penicillin: Secondary | ICD-10-CM | POA: Diagnosis not present

## 2022-05-28 DIAGNOSIS — Z888 Allergy status to other drugs, medicaments and biological substances status: Secondary | ICD-10-CM | POA: Insufficient documentation

## 2022-05-28 DIAGNOSIS — R188 Other ascites: Secondary | ICD-10-CM | POA: Insufficient documentation

## 2022-05-28 DIAGNOSIS — K429 Umbilical hernia without obstruction or gangrene: Secondary | ICD-10-CM | POA: Insufficient documentation

## 2022-05-28 DIAGNOSIS — K5730 Diverticulosis of large intestine without perforation or abscess without bleeding: Secondary | ICD-10-CM | POA: Diagnosis not present

## 2022-05-28 DIAGNOSIS — Z5111 Encounter for antineoplastic chemotherapy: Secondary | ICD-10-CM | POA: Diagnosis not present

## 2022-05-28 DIAGNOSIS — D509 Iron deficiency anemia, unspecified: Secondary | ICD-10-CM

## 2022-05-28 DIAGNOSIS — M2550 Pain in unspecified joint: Secondary | ICD-10-CM | POA: Insufficient documentation

## 2022-05-28 DIAGNOSIS — R971 Elevated cancer antigen 125 [CA 125]: Secondary | ICD-10-CM | POA: Insufficient documentation

## 2022-05-28 DIAGNOSIS — Z79899 Other long term (current) drug therapy: Secondary | ICD-10-CM | POA: Diagnosis not present

## 2022-05-28 DIAGNOSIS — E119 Type 2 diabetes mellitus without complications: Secondary | ICD-10-CM | POA: Diagnosis not present

## 2022-05-28 DIAGNOSIS — Z885 Allergy status to narcotic agent status: Secondary | ICD-10-CM | POA: Insufficient documentation

## 2022-05-28 DIAGNOSIS — C55 Malignant neoplasm of uterus, part unspecified: Secondary | ICD-10-CM

## 2022-05-28 LAB — CMP (CANCER CENTER ONLY)
ALT: 6 U/L (ref 0–44)
AST: 11 U/L — ABNORMAL LOW (ref 15–41)
Albumin: 4.2 g/dL (ref 3.5–5.0)
Alkaline Phosphatase: 114 U/L (ref 38–126)
Anion gap: 7 (ref 5–15)
BUN: 13 mg/dL (ref 8–23)
CO2: 27 mmol/L (ref 22–32)
Calcium: 9.2 mg/dL (ref 8.9–10.3)
Chloride: 104 mmol/L (ref 98–111)
Creatinine: 0.81 mg/dL (ref 0.44–1.00)
GFR, Estimated: >60 mL/min (ref >60)
Glucose, Bld: 140 mg/dL — ABNORMAL HIGH (ref 70–99)
Potassium: 3.4 mmol/L — ABNORMAL LOW (ref 3.5–5.1)
Sodium: 138 mmol/L (ref 135–145)
Total Bilirubin: 0.4 mg/dL (ref 0.3–1.2)
Total Protein: 7.2 g/dL (ref 6.5–8.1)

## 2022-05-28 LAB — CBC WITH DIFFERENTIAL (CANCER CENTER ONLY)
Abs Immature Granulocytes: 0.03 10*3/uL (ref 0.00–0.07)
Basophils Absolute: 0 10*3/uL (ref 0.0–0.1)
Basophils Relative: 0 %
Eosinophils Absolute: 0 10*3/uL (ref 0.0–0.5)
Eosinophils Relative: 0 %
HCT: 27.7 % — ABNORMAL LOW (ref 36.0–46.0)
Hemoglobin: 9.1 g/dL — ABNORMAL LOW (ref 12.0–15.0)
Immature Granulocytes: 0 %
Lymphocytes Relative: 22 %
Lymphs Abs: 1.5 10*3/uL (ref 0.7–4.0)
MCH: 28.8 pg (ref 26.0–34.0)
MCHC: 32.9 g/dL (ref 30.0–36.0)
MCV: 87.7 fL (ref 80.0–100.0)
Monocytes Absolute: 0.8 10*3/uL (ref 0.1–1.0)
Monocytes Relative: 12 %
Neutro Abs: 4.5 10*3/uL (ref 1.7–7.7)
Neutrophils Relative %: 66 %
Platelet Count: 236 10*3/uL (ref 150–400)
RBC: 3.16 MIL/uL — ABNORMAL LOW (ref 3.87–5.11)
RDW: 16.8 % — ABNORMAL HIGH (ref 11.5–15.5)
WBC Count: 6.9 10*3/uL (ref 4.0–10.5)
nRBC: 0 % (ref 0.0–0.2)

## 2022-05-28 LAB — SAMPLE TO BLOOD BANK

## 2022-05-28 LAB — MAGNESIUM: Magnesium: 1.3 mg/dL — ABNORMAL LOW (ref 1.7–2.4)

## 2022-05-28 MED ORDER — HEPARIN SOD (PORK) LOCK FLUSH 100 UNIT/ML IV SOLN
500.0000 [IU] | Freq: Once | INTRAVENOUS | Status: AC
  Administered 2022-05-28: 500 [IU]

## 2022-05-28 MED ORDER — SODIUM CHLORIDE 0.9% FLUSH
10.0000 mL | Freq: Once | INTRAVENOUS | Status: AC
  Administered 2022-05-28: 10 mL

## 2022-05-28 MED FILL — Fosaprepitant Dimeglumine For IV Infusion 150 MG (Base Eq): INTRAVENOUS | Qty: 5 | Status: AC

## 2022-05-28 MED FILL — Dexamethasone Sodium Phosphate Inj 100 MG/10ML: INTRAMUSCULAR | Qty: 1 | Status: AC

## 2022-05-28 NOTE — Assessment & Plan Note (Signed)

## 2022-05-28 NOTE — Assessment & Plan Note (Signed)
This is likely due to side effects of treatment She will continue tramadol as needed

## 2022-05-28 NOTE — Addendum Note (Signed)
Addended by: Carlene Coria L on: 05/28/2022 12:10 PM   Modules accepted: Orders

## 2022-05-28 NOTE — Assessment & Plan Note (Signed)
She is healing well and overall had excellent response to neoadjuvant chemotherapy We will continue treatment for 2 more cycles Recent treatment was complicated by bone aches that resolved with tramadol She will continue the same I plan to order CT imaging after completion of chemotherapy next month

## 2022-05-29 ENCOUNTER — Other Ambulatory Visit (HOSPITAL_COMMUNITY): Payer: Self-pay

## 2022-05-29 ENCOUNTER — Other Ambulatory Visit: Payer: Self-pay | Admitting: Hematology and Oncology

## 2022-05-29 ENCOUNTER — Encounter: Payer: Self-pay | Admitting: Hematology and Oncology

## 2022-05-29 ENCOUNTER — Inpatient Hospital Stay: Payer: Medicare HMO

## 2022-05-29 ENCOUNTER — Inpatient Hospital Stay: Payer: Medicare HMO | Admitting: Licensed Clinical Social Worker

## 2022-05-29 VITALS — BP 145/64 | HR 84 | Temp 97.9°F | Resp 18

## 2022-05-29 DIAGNOSIS — Z7901 Long term (current) use of anticoagulants: Secondary | ICD-10-CM | POA: Diagnosis not present

## 2022-05-29 DIAGNOSIS — D6481 Anemia due to antineoplastic chemotherapy: Secondary | ICD-10-CM | POA: Diagnosis not present

## 2022-05-29 DIAGNOSIS — C55 Malignant neoplasm of uterus, part unspecified: Secondary | ICD-10-CM

## 2022-05-29 DIAGNOSIS — M791 Myalgia, unspecified site: Secondary | ICD-10-CM | POA: Diagnosis not present

## 2022-05-29 DIAGNOSIS — M2550 Pain in unspecified joint: Secondary | ICD-10-CM | POA: Diagnosis not present

## 2022-05-29 DIAGNOSIS — E119 Type 2 diabetes mellitus without complications: Secondary | ICD-10-CM | POA: Diagnosis not present

## 2022-05-29 DIAGNOSIS — M898X9 Other specified disorders of bone, unspecified site: Secondary | ICD-10-CM | POA: Diagnosis not present

## 2022-05-29 DIAGNOSIS — K5730 Diverticulosis of large intestine without perforation or abscess without bleeding: Secondary | ICD-10-CM | POA: Diagnosis not present

## 2022-05-29 DIAGNOSIS — Z79899 Other long term (current) drug therapy: Secondary | ICD-10-CM | POA: Diagnosis not present

## 2022-05-29 DIAGNOSIS — K429 Umbilical hernia without obstruction or gangrene: Secondary | ICD-10-CM | POA: Diagnosis not present

## 2022-05-29 DIAGNOSIS — T451X5A Adverse effect of antineoplastic and immunosuppressive drugs, initial encounter: Secondary | ICD-10-CM | POA: Diagnosis not present

## 2022-05-29 DIAGNOSIS — Z880 Allergy status to penicillin: Secondary | ICD-10-CM | POA: Diagnosis not present

## 2022-05-29 DIAGNOSIS — R188 Other ascites: Secondary | ICD-10-CM | POA: Diagnosis not present

## 2022-05-29 DIAGNOSIS — Z885 Allergy status to narcotic agent status: Secondary | ICD-10-CM | POA: Diagnosis not present

## 2022-05-29 DIAGNOSIS — R971 Elevated cancer antigen 125 [CA 125]: Secondary | ICD-10-CM | POA: Diagnosis not present

## 2022-05-29 DIAGNOSIS — Z888 Allergy status to other drugs, medicaments and biological substances status: Secondary | ICD-10-CM | POA: Diagnosis not present

## 2022-05-29 DIAGNOSIS — Z5111 Encounter for antineoplastic chemotherapy: Secondary | ICD-10-CM | POA: Diagnosis not present

## 2022-05-29 LAB — CA 125: Cancer Antigen (CA) 125: 9.1 U/mL (ref 0.0–38.1)

## 2022-05-29 MED ORDER — DIPHENHYDRAMINE HCL 50 MG/ML IJ SOLN
25.0000 mg | Freq: Once | INTRAMUSCULAR | Status: AC
  Administered 2022-05-29: 25 mg via INTRAVENOUS
  Filled 2022-05-29: qty 1

## 2022-05-29 MED ORDER — SODIUM CHLORIDE 0.9 % IV SOLN
10.0000 mg | Freq: Once | INTRAVENOUS | Status: AC
  Administered 2022-05-29: 10 mg via INTRAVENOUS
  Filled 2022-05-29: qty 10

## 2022-05-29 MED ORDER — SODIUM CHLORIDE 0.9 % IV SOLN
150.0000 mg | Freq: Once | INTRAVENOUS | Status: AC
  Administered 2022-05-29: 150 mg via INTRAVENOUS
  Filled 2022-05-29: qty 150

## 2022-05-29 MED ORDER — SODIUM CHLORIDE 0.9% FLUSH
10.0000 mL | INTRAVENOUS | Status: DC | PRN
  Administered 2022-05-29: 10 mL

## 2022-05-29 MED ORDER — MAGNESIUM SULFATE 2 GM/50ML IV SOLN
2.0000 g | Freq: Once | INTRAVENOUS | Status: AC
  Administered 2022-05-29: 2 g via INTRAVENOUS
  Filled 2022-05-29: qty 50

## 2022-05-29 MED ORDER — SODIUM CHLORIDE 0.9 % IV SOLN
460.0000 mg | Freq: Once | INTRAVENOUS | Status: AC
  Administered 2022-05-29: 460 mg via INTRAVENOUS
  Filled 2022-05-29: qty 46

## 2022-05-29 MED ORDER — MAGNESIUM OXIDE -MG SUPPLEMENT 400 (240 MG) MG PO TABS
400.0000 mg | ORAL_TABLET | Freq: Two times a day (BID) | ORAL | 1 refills | Status: DC
  Filled 2022-05-29: qty 60, 30d supply, fill #0

## 2022-05-29 MED ORDER — PALONOSETRON HCL INJECTION 0.25 MG/5ML
0.2500 mg | Freq: Once | INTRAVENOUS | Status: AC
  Administered 2022-05-29: 0.25 mg via INTRAVENOUS
  Filled 2022-05-29: qty 5

## 2022-05-29 MED ORDER — SODIUM CHLORIDE 0.9 % IV SOLN: Freq: Once | INTRAVENOUS | Status: AC

## 2022-05-29 MED ORDER — HEPARIN SOD (PORK) LOCK FLUSH 100 UNIT/ML IV SOLN
500.0000 [IU] | Freq: Once | INTRAVENOUS | Status: AC | PRN
  Administered 2022-05-29: 500 [IU]

## 2022-05-29 MED ORDER — FAMOTIDINE IN NACL 20-0.9 MG/50ML-% IV SOLN
20.0000 mg | Freq: Once | INTRAVENOUS | Status: AC
  Administered 2022-05-29: 20 mg via INTRAVENOUS
  Filled 2022-05-29: qty 50

## 2022-05-29 MED ORDER — SODIUM CHLORIDE 0.9 % IV SOLN
175.0000 mg/m2 | Freq: Once | INTRAVENOUS | Status: AC
  Administered 2022-05-29: 330 mg via INTRAVENOUS
  Filled 2022-05-29: qty 55

## 2022-05-29 NOTE — Progress Notes (Signed)
[place] CSW Progress Note  Holiday representative met with patient to complete application for [employer] [place] assistance. Completed and submitted today.  Patient also continued processing family communication concerns. Stated that she feels better as she continues to work through the mismatch in expectations versus what was actually received as help from daughter post-surgery. She plans to follow-up with chaplain as scheduled on 8/7.    Sherri Schirm E Lennix Kneisel, LCSW

## 2022-05-29 NOTE — Patient Instructions (Signed)
[facility] MEDICAL ONCOLOGY  Discharge Instructions: Thank you for choosing [facility] to provide your oncology and hematology care.   If you have a lab appointment with the Cancer Center, please go directly to the Cancer Center and check in at the registration area.   Wear comfortable clothing and clothing appropriate for easy access to any Portacath or PICC line.   We strive to give you quality time with your provider. You may need to reschedule your appointment if you arrive late (15 or more minutes).  Arriving late affects you and other patients whose appointments are after yours.  Also, if you miss three or more appointments without notifying the office, you may be dismissed from the clinic at the provider's discretion.      For prescription refill requests, have your pharmacy contact our office and allow 72 hours for refills to be completed.    Today you received the following chemotherapy and/or immunotherapy agents paclitaxel, carboplatin      To help prevent nausea and vomiting after your treatment, we encourage you to take your nausea medication as directed.  BELOW ARE SYMPTOMS THAT SHOULD BE REPORTED IMMEDIATELY: *FEVER GREATER THAN 100.4 F (38 C) OR HIGHER *CHILLS OR SWEATING *NAUSEA AND VOMITING THAT IS NOT CONTROLLED WITH YOUR NAUSEA MEDICATION *UNUSUAL SHORTNESS OF BREATH *UNUSUAL BRUISING OR BLEEDING *URINARY PROBLEMS (pain or burning when urinating, or frequent urination) *BOWEL PROBLEMS (unusual diarrhea, constipation, pain near the anus) TENDERNESS IN MOUTH AND THROAT WITH OR WITHOUT PRESENCE OF ULCERS (sore throat, sores in mouth, or a toothache) UNUSUAL RASH, SWELLING OR PAIN  UNUSUAL VAGINAL DISCHARGE OR ITCHING   Items with * indicate a potential emergency and should be followed up as soon as possible or go to the Emergency Department if any problems should occur.  Please show the CHEMOTHERAPY ALERT CARD or IMMUNOTHERAPY ALERT CARD at  check-in to the Emergency Department and triage nurse.  Should you have questions after your visit or need to cancel or reschedule your appointment, please contact [facility]COL[facility]  Dept: [phone]  and follow the prompts.  Office hours are 8:00 a.m. to 4:30 p.m. Monday - Friday. Please note that voicemails left after 4:00 p.m. may not be returned until the following business day.  We are closed weekends and major holidays. You have access to a nurse at all times for urgent questions. Please call the main number to the clinic Dept: [phone] and follow the prompts.   For any non-urgent questions, you may also contact your provider using MyChart. We now offer e-Visits for anyone 18 and older to request care online for non-urgent symptoms. For details visit [email or web address].   Also download the MyChart app! Go to the app store, search "MyChart", open the app, select [place], and log in with your MyChart username and password.  Masks are optional in the cancer centers. If you would like for your care team to wear a mask while they are taking care of you, please let them know. You may have one support person who is at least 69 years old accompany you for your appointments. 

## 2022-05-29 NOTE — Assessment & Plan Note (Signed)
She will receive IV magnesium and continue on oral supplement

## 2022-05-29 NOTE — Progress Notes (Signed)
[place] OFFICE PROGRESS NOTE  Patient Care Team: [name], MD as PCP - General (Internal Medicine) Pixie Casino, MD as PCP - Cardiology (Cardiology) [name], RN as [place] Management  ASSESSMENT & PLAN:  Uterine cancer [facility]) She is healing well and overall had excellent response to neoadjuvant chemotherapy We will continue treatment for 2 more cycles Recent treatment was complicated by bone aches that resolved with tramadol She will continue the same I plan to order CT imaging after completion of chemotherapy next month  Anemia due to antineoplastic chemotherapy This is likely due to recent treatment. The patient denies recent history of bleeding such as epistaxis, hematuria or hematochezia. She is asymptomatic from the anemia. I will observe for now.  She does not require transfusion now. I will continue the chemotherapy at current dose without dosage adjustment.  If the anemia gets progressive worse in the future, I might have to delay her treatment or adjust the chemotherapy dose.   Bone pain This is likely due to side effects of treatment She will continue tramadol as needed  Hypomagnesemia She will receive IV magnesium and continue on oral supplement  No orders of the defined types were placed in this encounter.   All questions were answered. The patient knows to call the clinic with any problems, questions or concerns. The total time spent in the appointment was 20 minutes encounter with patients including review of chart and various tests results, discussions about plan of care and coordination of care plan   Sherri Lark, MD 05/29/2022 7:34 AM  INTERVAL HISTORY: Please see below for problem oriented charting. she returns for treatment follow-up for chemotherapy She felt ok except for bone achiness after treatment No worsening neuropathy  REVIEW OF SYSTEMS:   Constitutional: Denies fevers, chills or  abnormal weight loss Eyes: Denies blurriness of vision Ears, nose, mouth, throat, and face: Denies mucositis or sore throat Respiratory: Denies cough, dyspnea or wheezes Cardiovascular: Denies palpitation, chest discomfort or lower extremity swelling Gastrointestinal:  Denies nausea, heartburn or change in bowel habits Skin: Denies abnormal skin rashes Lymphatics: Denies new lymphadenopathy or easy bruising Neurological:Denies numbness, tingling or new weaknesses Behavioral/Psych: Mood is stable, no new changes  All other systems were reviewed with the patient and are negative.  I have reviewed the past medical history, past surgical history, social history and family history with the patient and they are unchanged from previous note.  ALLERGIES:  is allergic to codeine sulfate, penicillins, percocet [oxycodone-acetaminophen], and zestril [lisinopril].  MEDICATIONS:  Current Outpatient Medications  Medication Sig Dispense Refill   acetaminophen (TYLENOL) 500 MG tablet Take 1,000 mg by mouth every 6 (six) hours as needed for mild pain.     albuterol (VENTOLIN HFA) 108 (90 Base) MCG/ACT inhaler Inhale 2 puffs into the lungs every 4 (four) hours as needed for wheezing or shortness of breath. 8 g 0   atorvastatin (LIPITOR) 20 MG tablet Take 1 tablet (20 mg total) by mouth at bedtime. 30 tablet 11   B Complex-C (B-COMPLEX WITH VITAMIN C) tablet Take 1 tablet by mouth in the morning.     Blood Glucose Monitoring Suppl (ONE TOUCH ULTRA MINI) w/Device KIT Please use as directed. 1 each 0   Cholecalciferol 1000 units capsule Take 1 capsule (1,000 Units total) by mouth daily. 90 capsule 1   dexamethasone (DECADRON) 4 MG tablet Take 2 tablets by mouth the night before and 2 tablets the morning of chemotherapy, every 3  weeks for 6 cycles 36 tablet 6   diltiazem (CARDIZEM CD) 120 MG 24 hr capsule Take 1 capsule (120 mg total) by mouth daily. 30 capsule 2   dorzolamide-timolol (COSOPT) 22.3-6.8 MG/ML  ophthalmic solution Place 1 drop into both eyes 2 (two) times daily.     glucose blood (ONE TOUCH ULTRA TEST) test strip Use as instructed 100 each 1   Insulin Pen Needle 32G X 4 MM MISC 1 each by Does not apply route daily. 100 each 3   latanoprost (XALATAN) 0.005 % ophthalmic solution Place 1 drop into both eyes at bedtime.     lidocaine-prilocaine (EMLA) cream Apply to affected area once 30 g 3   liraglutide (VICTOZA) 18 MG/3ML SOPN PLEASE START TAKING VICTOZA 0.6MG DAILY WEEK 1, THEN TAKE 1.2MG DAILY WEEK 2, AND THEN TAKE 1.8MG DAILY THEREAFTER (Patient taking differently: Inject 1.2 mg into the skin daily.) 9 mL 3   losartan (COZAAR) 100 MG tablet Take 100 mg by mouth at bedtime.     magnesium oxide (MAG-OX) 400 (240 Mg) MG tablet Take 1 tablet by mouth 2 times daily. 60 tablet 1   metFORMIN (GLUCOPHAGE) 1000 MG tablet Take 1 tablet (1,000 mg total) by mouth 2 (two) times daily. 120 tablet 3   ondansetron (ZOFRAN) 8 MG tablet Take 1 tablet (8 mg total) by mouth 2 (two) times daily as needed. Start on the third day after chemotherapy. 30 tablet 1   ONETOUCH DELICA LANCETS 63K MISC Please use as directed. 100 each 0   pantoprazole (PROTONIX) 40 MG tablet Take 1 tablet by mouth daily. 30 tablet 1   polyethylene glycol powder (GLYCOLAX/MIRALAX) 17 GM/SCOOP powder Take 17 g by mouth daily. 238 g 1   prochlorperazine (COMPAZINE) 10 MG tablet Take 1 tablet (10 mg total) by mouth every 6 (six) hours as needed (Nausea or vomiting). 30 tablet 1   rivaroxaban (XARELTO) 20 MG TABS tablet Take 1 tablet (20 mg total) by mouth daily with supper. 30 tablet 1   traMADol (ULTRAM) 50 MG tablet Take 1 tablet (50 mg total) by mouth every 6 (six) hours as needed for severe pain. 30 tablet 0   No current facility-administered medications for this visit.    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 Heterogeneous endometrial thickening with appearance of moderate complex fluid in the endometrial canal, possible hemorrhagic material. Endometrial thickness is considered abnormal for an asymptomatic post-menopausal female. Endometrial sampling should be considered to exclude carcinoma. 2. Nonvisualized ovary 3. Heterogeneous thick-walled urinary bladder, correlate for cystitis.   12/28/2021 Pathology Results   CYTOLOGY - NON PAP  CASE: [id number]  PATIENT: Sherri Gill  Non-Gynecological Cytology Report   Clinical History: Abnormal pelvic US, highly suspicious of malignant ascites  Specimen Submitted:  A. ASCITES, PARACENTESIS:    FINAL MICROSCOPIC DIAGNOSIS:  - Malignant cells consistent with adenocarcinoma  - See comment   SPECIMEN ADEQUACY:  Satisfactory for evaluation   DIAGNOSTIC COMMENTS:  Immunohistochemical stains show that the tumor cells are positive for CK7 and PAX8 while they are negative for CK20, CDX2 and ER, consistent with above interpretation.  Additionally, the tumor cells are diffusely positive for p53 (clonal overexpression), suggestive of a high-grade serous carcinoma.    12/28/2021 Imaging   US pelvis 1. Persistent abnormal appearance of the endometrium, heterogeneously thickened and complex with possible intraluminal  fluid. This is similar to that seen on December 2022 ultrasound. Endometrial thickness is considered abnormal for an asymptomatic post-menopausal female. Endometrial sampling should be considered to exclude carcinoma. 2. Nonvisualization of the ovaries.   12/28/2021 Imaging   1. Large volume ascites. 2. Small umbilical hernia containing fat and ascites. 3. Haziness of anterior omentum most likely related to ascites, other etiologies such as metastatic disease can not be excluded. 4. Diffuse colonic diverticulosis without evidence for diverticulitis.   12/29/2021 Procedure   Successful ultrasound-guided paracentesis  yielding 2.8 liters of peritoneal fluid.   01/14/2022 Initial Diagnosis   Uterine cancer ([place])   01/14/2022 Cancer Staging   Staging form: Corpus Uteri - Carcinoma and Carcinosarcoma, AJCC 8th Edition - Clinical: Stage III (cT3, cN0, cM0) - Signed by Sherri Lark, MD on 01/14/2022 Stage prefix: Initial diagnosis   01/15/2022 Surgery   Surgery: Cervical dilation under ultrasound guidance, hysteroscopy, endometrial sampling using the Myosure   Surgeons:  Valarie Cones MD    Pathology: endometrial curetteings   Operative findings: On EUA, 8 cm mobile uterus, some nodularity along cul de sac. Cervix normal in appearance without discernible external os. Difficulty noted in dilating the cervix after using scalpel to incise in area of what what thought to be the external os. Under ultrasound guidance, cervix was dilated with confirmation on imaging of intra-uterine placement of dilators. On hysteroscopy, abnormal appearing endocervix and very calcified tissue obscuring good visualization of endometrium concerning for replacement by tumor.    01/15/2022 Pathology Results   FINAL MICROSCOPIC DIAGNOSIS:   A.   ENDOMETRIUM, CURETTAGE:  -    Serous endometrial carcinoma.   COMMENT:   The carcinoma is strongly and diffusely positive for p53 and diffuse p16 positivity.  The ER shows relatively diffuse positivity.  Overall, the histologic features (slit-like glandular spaces and psammoma bodies) and the immunohistochemical phenotype are most characteristic for a serous carcinoma.    01/21/2022 Procedure   Placement of single lumen port a cath via right internal jugular vein. The catheter tip lies at the cavo-atrial junction. A power injectable port a cath was placed and is ready for immediate use.     01/25/2022 -  Chemotherapy   Patient is on Treatment Plan : UTERINE Carboplatin AUC 6 / Paclitaxel q21d     03/21/2022 Imaging   1. Near complete resolution of previously noted ascites, with only small  volume residual perihepatic ascites. 2. Improved omental caking and stranding in the ventral abdomen. 3. Findings are consistent with treatment response of peritoneal metastatic disease. 4. Thickening of the urinary bladder wall , consistent with nonspecific infectious or inflammatory cystitis. Correlate with urinalysis.   Aortic Atherosclerosis (ICD10-I70.0).       03/22/2022 Tumor Marker   Patient's tumor was tested for the following markers: CA-125. Results of the tumor marker test revealed 21.5.   04/09/2022 Surgery     Surgery: Diagnostic laparoscopy, conversion to exploratory laparotomy, partial omentectomy, lysis of adhesions for approximately 60 minutes, total abdominal hysterectomy, bilateral salpingo-oophorectomy, excision of ileal tumor implant and excision of transverse colon epiploica tumor implant   04/09/2022 Pathology Results   A.   OMENTUM, RESECTION:  -    Metastatic serous carcinoma.   B.   ILEAL TUMOR IMPLANTS, EXCISION:  -    Adhesions with small focus of metastatic serous carcinoma, possibly  intralymphatic.   C.   UTERUS, CERVIX, BILATERAL FALLOPIAN TUBES AND OVARIES:  -    Serous carcinoma, endometrial, invasive into outer half of uterus,  with involvement of  cervical stroma, uterine serosa, ovaries and fallopian tubes.   -    Negative for cervical dysplasia.  -    Leiomyomas, largest 1.2 cm in greatest dimension.   D.   COLON, MESENTARY NODULE, TRANSVERSE, EXCISION:  -    Metastatic serous carcinoma   05/09/2022 Tumor Marker   Patient's tumor was tested for the following markers: CA-125. Results of the tumor marker test revealed 12.1.     PHYSICAL EXAMINATION: ECOG PERFORMANCE STATUS: 0 - Asymptomatic  Vitals:   05/28/22 1225  BP: (!) 137/52  Pulse: 82  Resp: 18  Temp: 98.6 F (37 C)  SpO2: 99%   Filed Weights   05/28/22 1225  Weight: 173 lb 12.8 oz (78.8 kg)    GENERAL:alert, no distress and comfortable NEURO: alert & oriented x 3  with fluent speech, no focal motor/sensory deficits  LABORATORY DATA:  I have reviewed the data as listed    Component Value Date/Time   NA 138 05/28/2022 1139   NA 139 01/10/2022 1644   K 3.4 (L) 05/28/2022 1139   CL 104 05/28/2022 1139   CO2 27 05/28/2022 1139   GLUCOSE 140 (H) 05/28/2022 1139   GLUCOSE 93 09/10/2006 0834   BUN 13 05/28/2022 1139   BUN 13 01/10/2022 1644   CREATININE 0.81 05/28/2022 1139   CREATININE 0.64 07/13/2014 1621   CALCIUM 9.2 05/28/2022 1139   CALCIUM 9.7 03/29/2010 1554   PROT 7.2 05/28/2022 1139   PROT 7.6 11/09/2020 1156   ALBUMIN 4.2 05/28/2022 1139   ALBUMIN 4.4 11/09/2020 1156   AST 11 (L) 05/28/2022 1139   ALT 6 05/28/2022 1139   ALKPHOS 114 05/28/2022 1139   BILITOT 0.4 05/28/2022 1139   GFRNONAA >60 05/28/2022 1139   GFRNONAA >89 07/13/2014 1621   GFRAA 86 11/09/2020 1156   GFRAA >89 07/13/2014 1621    No results found for: "SPEP", "UPEP"  Lab Results  Component Value Date   WBC 6.9 05/28/2022   NEUTROABS 4.5 05/28/2022   HGB 9.1 (L) 05/28/2022   HCT 27.7 (L) 05/28/2022   MCV 87.7 05/28/2022   PLT 236 05/28/2022      Chemistry      Component Value Date/Time   NA 138 05/28/2022 1139   NA 139 01/10/2022 1644   K 3.4 (L) 05/28/2022 1139   CL 104 05/28/2022 1139   CO2 27 05/28/2022 1139   BUN 13 05/28/2022 1139   BUN 13 01/10/2022 1644   CREATININE 0.81 05/28/2022 1139   CREATININE 0.64 07/13/2014 1621      Component Value Date/Time   CALCIUM 9.2 05/28/2022 1139   CALCIUM 9.7 03/29/2010 1554   ALKPHOS 114 05/28/2022 1139   AST 11 (L) 05/28/2022 1139   ALT 6 05/28/2022 1139   BILITOT 0.4 05/28/2022 1139

## 2022-06-03 ENCOUNTER — Encounter: Payer: Self-pay | Admitting: General Practice

## 2022-06-03 NOTE — Progress Notes (Signed)
[facility] Spiritual Care Note  Rescheduled today's Spiritual Care appointment to tomorrow 06/04/2022 at 12 noon per [facility] request.   Suzette Battiest, [place], [facility] Pager [phone] Voicemail [phone]

## 2022-06-04 ENCOUNTER — Encounter: Payer: Self-pay | Admitting: General Practice

## 2022-06-04 NOTE — Progress Notes (Signed)
[facility] Spiritual Care Note  Sherri Gill phoned to cancel appointment today; she plans to call to reschedule when she is feeling better.   [place], [place], [facility] Pager [phone] Voicemail [phone]

## 2022-06-05 ENCOUNTER — Telehealth: Payer: Self-pay | Admitting: Pharmacist

## 2022-06-05 NOTE — Telephone Encounter (Signed)
Patient attempted to be outreached by Berdie Ogren, PharmD Candidate on 06/05/2022 to discuss hypertension. Left voicemail for patient to return our call at their convenience.

## 2022-06-06 ENCOUNTER — Other Ambulatory Visit (HOSPITAL_COMMUNITY): Payer: Self-pay

## 2022-06-06 ENCOUNTER — Other Ambulatory Visit: Payer: Self-pay | Admitting: Hematology and Oncology

## 2022-06-06 DIAGNOSIS — C55 Malignant neoplasm of uterus, part unspecified: Secondary | ICD-10-CM

## 2022-06-06 MED ORDER — PANTOPRAZOLE SODIUM 40 MG PO TBEC
40.0000 mg | DELAYED_RELEASE_TABLET | Freq: Every day | ORAL | 1 refills | Status: DC
  Filled 2022-06-06: qty 30, 30d supply, fill #0

## 2022-06-07 ENCOUNTER — Other Ambulatory Visit (HOSPITAL_COMMUNITY): Payer: Self-pay

## 2022-06-11 ENCOUNTER — Telehealth: Payer: Self-pay

## 2022-06-11 ENCOUNTER — Other Ambulatory Visit (HOSPITAL_COMMUNITY): Payer: Self-pay

## 2022-06-11 NOTE — Telephone Encounter (Signed)
Pt reached out wanting to enroll with [employer] again for her victoza. Would like me to mail the application next week when she comes home. Pt was also looking into assistance for xarelto (copay $40+). Informed patient they only assist uninsured patients now.

## 2022-06-13 ENCOUNTER — Other Ambulatory Visit (HOSPITAL_COMMUNITY): Payer: Self-pay

## 2022-06-14 ENCOUNTER — Other Ambulatory Visit: Payer: Self-pay | Admitting: *Deleted

## 2022-06-17 ENCOUNTER — Ambulatory Visit: Payer: Medicare HMO | Admitting: General Practice

## 2022-06-19 ENCOUNTER — Ambulatory Visit (HOSPITAL_BASED_OUTPATIENT_CLINIC_OR_DEPARTMENT_OTHER): Payer: Medicare HMO | Admitting: Family

## 2022-06-19 NOTE — Telephone Encounter (Signed)
Called pt - no answer. Unable to leave a message d/t vm has not been set-up yet.

## 2022-06-20 MED FILL — Fosaprepitant Dimeglumine For IV Infusion 150 MG (Base Eq): INTRAVENOUS | Qty: 5 | Status: AC

## 2022-06-20 MED FILL — Dexamethasone Sodium Phosphate Inj 100 MG/10ML: INTRAMUSCULAR | Qty: 1 | Status: AC

## 2022-06-21 ENCOUNTER — Other Ambulatory Visit: Payer: Self-pay

## 2022-06-21 ENCOUNTER — Inpatient Hospital Stay: Payer: Medicare HMO

## 2022-06-21 ENCOUNTER — Inpatient Hospital Stay: Payer: Medicare HMO | Admitting: Hematology and Oncology

## 2022-06-21 ENCOUNTER — Encounter: Payer: Self-pay | Admitting: Hematology and Oncology

## 2022-06-21 ENCOUNTER — Telehealth: Payer: Self-pay | Admitting: *Deleted

## 2022-06-21 VITALS — BP 161/74 | HR 72 | Temp 98.3°F | Resp 18 | Ht 63.0 in | Wt 180.6 lb

## 2022-06-21 DIAGNOSIS — C55 Malignant neoplasm of uterus, part unspecified: Secondary | ICD-10-CM

## 2022-06-21 DIAGNOSIS — M7918 Myalgia, other site: Secondary | ICD-10-CM | POA: Insufficient documentation

## 2022-06-21 DIAGNOSIS — Z880 Allergy status to penicillin: Secondary | ICD-10-CM | POA: Diagnosis not present

## 2022-06-21 DIAGNOSIS — Z888 Allergy status to other drugs, medicaments and biological substances status: Secondary | ICD-10-CM | POA: Diagnosis not present

## 2022-06-21 DIAGNOSIS — D509 Iron deficiency anemia, unspecified: Secondary | ICD-10-CM

## 2022-06-21 DIAGNOSIS — R188 Other ascites: Secondary | ICD-10-CM | POA: Diagnosis not present

## 2022-06-21 DIAGNOSIS — Z79899 Other long term (current) drug therapy: Secondary | ICD-10-CM | POA: Diagnosis not present

## 2022-06-21 DIAGNOSIS — M898X9 Other specified disorders of bone, unspecified site: Secondary | ICD-10-CM | POA: Diagnosis not present

## 2022-06-21 DIAGNOSIS — Z7901 Long term (current) use of anticoagulants: Secondary | ICD-10-CM | POA: Diagnosis not present

## 2022-06-21 DIAGNOSIS — E119 Type 2 diabetes mellitus without complications: Secondary | ICD-10-CM | POA: Diagnosis not present

## 2022-06-21 DIAGNOSIS — T451X5A Adverse effect of antineoplastic and immunosuppressive drugs, initial encounter: Secondary | ICD-10-CM

## 2022-06-21 DIAGNOSIS — K5730 Diverticulosis of large intestine without perforation or abscess without bleeding: Secondary | ICD-10-CM | POA: Diagnosis not present

## 2022-06-21 DIAGNOSIS — Z5111 Encounter for antineoplastic chemotherapy: Secondary | ICD-10-CM | POA: Diagnosis not present

## 2022-06-21 DIAGNOSIS — M2550 Pain in unspecified joint: Secondary | ICD-10-CM | POA: Diagnosis not present

## 2022-06-21 DIAGNOSIS — E1149 Type 2 diabetes mellitus with other diabetic neurological complication: Secondary | ICD-10-CM

## 2022-06-21 DIAGNOSIS — Z885 Allergy status to narcotic agent status: Secondary | ICD-10-CM | POA: Diagnosis not present

## 2022-06-21 DIAGNOSIS — D6481 Anemia due to antineoplastic chemotherapy: Secondary | ICD-10-CM | POA: Diagnosis not present

## 2022-06-21 DIAGNOSIS — M791 Myalgia, unspecified site: Secondary | ICD-10-CM | POA: Diagnosis not present

## 2022-06-21 DIAGNOSIS — R971 Elevated cancer antigen 125 [CA 125]: Secondary | ICD-10-CM | POA: Diagnosis not present

## 2022-06-21 DIAGNOSIS — K429 Umbilical hernia without obstruction or gangrene: Secondary | ICD-10-CM | POA: Diagnosis not present

## 2022-06-21 LAB — CMP (CANCER CENTER ONLY)
ALT: 6 U/L (ref 0–44)
AST: 9 U/L — ABNORMAL LOW (ref 15–41)
Albumin: 4.2 g/dL (ref 3.5–5.0)
Alkaline Phosphatase: 125 U/L (ref 38–126)
Anion gap: 6 (ref 5–15)
BUN: 16 mg/dL (ref 8–23)
CO2: 27 mmol/L (ref 22–32)
Calcium: 9.5 mg/dL (ref 8.9–10.3)
Chloride: 106 mmol/L (ref 98–111)
Creatinine: 0.65 mg/dL (ref 0.44–1.00)
GFR, Estimated: >60 mL/min (ref >60)
Glucose, Bld: 143 mg/dL — ABNORMAL HIGH (ref 70–99)
Potassium: 4.1 mmol/L (ref 3.5–5.1)
Sodium: 139 mmol/L (ref 135–145)
Total Bilirubin: 0.3 mg/dL (ref 0.3–1.2)
Total Protein: 6.9 g/dL (ref 6.5–8.1)

## 2022-06-21 LAB — CBC WITH DIFFERENTIAL (CANCER CENTER ONLY)
Abs Immature Granulocytes: 0.06 10*3/uL (ref 0.00–0.07)
Basophils Absolute: 0 10*3/uL (ref 0.0–0.1)
Basophils Relative: 0 %
Eosinophils Absolute: 0 10*3/uL (ref 0.0–0.5)
Eosinophils Relative: 0 %
HCT: 25.2 % — ABNORMAL LOW (ref 36.0–46.0)
Hemoglobin: 8.4 g/dL — ABNORMAL LOW (ref 12.0–15.0)
Immature Granulocytes: 1 %
Lymphocytes Relative: 13 %
Lymphs Abs: 1 10*3/uL (ref 0.7–4.0)
MCH: 29.6 pg (ref 26.0–34.0)
MCHC: 33.3 g/dL (ref 30.0–36.0)
MCV: 88.7 fL (ref 80.0–100.0)
Monocytes Absolute: 0.3 10*3/uL (ref 0.1–1.0)
Monocytes Relative: 4 %
Neutro Abs: 6.5 10*3/uL (ref 1.7–7.7)
Neutrophils Relative %: 82 %
Platelet Count: 258 10*3/uL (ref 150–400)
RBC: 2.84 MIL/uL — ABNORMAL LOW (ref 3.87–5.11)
RDW: 16.3 % — ABNORMAL HIGH (ref 11.5–15.5)
WBC Count: 7.9 10*3/uL (ref 4.0–10.5)
nRBC: 0 % (ref 0.0–0.2)

## 2022-06-21 LAB — MAGNESIUM: Magnesium: 1.4 mg/dL — ABNORMAL LOW (ref 1.7–2.4)

## 2022-06-21 LAB — SAMPLE TO BLOOD BANK

## 2022-06-21 MED ORDER — SODIUM CHLORIDE 0.9 % IV SOLN
10.0000 mg | Freq: Once | INTRAVENOUS | Status: AC
  Administered 2022-06-21: 10 mg via INTRAVENOUS
  Filled 2022-06-21: qty 10

## 2022-06-21 MED ORDER — SODIUM CHLORIDE 0.9 % IV SOLN: Freq: Once | INTRAVENOUS | Status: AC

## 2022-06-21 MED ORDER — SODIUM CHLORIDE 0.9 % IV SOLN
105.0000 mg/m2 | Freq: Once | INTRAVENOUS | Status: AC
  Administered 2022-06-21: 198 mg via INTRAVENOUS
  Filled 2022-06-21: qty 33

## 2022-06-21 MED ORDER — DIPHENHYDRAMINE HCL 50 MG/ML IJ SOLN
25.0000 mg | Freq: Once | INTRAMUSCULAR | Status: AC
  Administered 2022-06-21: 25 mg via INTRAVENOUS
  Filled 2022-06-21: qty 1

## 2022-06-21 MED ORDER — SODIUM CHLORIDE 0.9% FLUSH
10.0000 mL | INTRAVENOUS | Status: DC | PRN
  Administered 2022-06-21: 10 mL

## 2022-06-21 MED ORDER — SODIUM CHLORIDE 0.9 % IV SOLN
150.0000 mg | Freq: Once | INTRAVENOUS | Status: AC
  Administered 2022-06-21: 150 mg via INTRAVENOUS
  Filled 2022-06-21: qty 150

## 2022-06-21 MED ORDER — SODIUM CHLORIDE 0.9% FLUSH
10.0000 mL | Freq: Once | INTRAVENOUS | Status: AC
  Administered 2022-06-21: 10 mL

## 2022-06-21 MED ORDER — FAMOTIDINE IN NACL 20-0.9 MG/50ML-% IV SOLN
20.0000 mg | Freq: Once | INTRAVENOUS | Status: AC
  Administered 2022-06-21: 20 mg via INTRAVENOUS
  Filled 2022-06-21: qty 50

## 2022-06-21 MED ORDER — SODIUM CHLORIDE 0.9 % IV SOLN
460.0000 mg | Freq: Once | INTRAVENOUS | Status: AC
  Administered 2022-06-21: 460 mg via INTRAVENOUS
  Filled 2022-06-21: qty 46

## 2022-06-21 MED ORDER — PALONOSETRON HCL INJECTION 0.25 MG/5ML
0.2500 mg | Freq: Once | INTRAVENOUS | Status: AC
  Administered 2022-06-21: 0.25 mg via INTRAVENOUS
  Filled 2022-06-21: qty 5

## 2022-06-21 MED ORDER — HEPARIN SOD (PORK) LOCK FLUSH 100 UNIT/ML IV SOLN
500.0000 [IU] | Freq: Once | INTRAVENOUS | Status: AC | PRN
  Administered 2022-06-21: 500 [IU]

## 2022-06-21 NOTE — Assessment & Plan Note (Signed)
She has mild hypomagnesemia She will continue oral magnesium replacement therapy

## 2022-06-21 NOTE — Telephone Encounter (Signed)
Connected with [name]  [phone] (home) , asking status of her FMLA per collaborative. Advised a signed ROI required to process form received 06/17/2022 by forms staff.  We ask to allow 7-10 business days (14 calendar) to process.  Forms completed in order received.  Currently nineteen forms ahead of this form.  Reports she is currently awaiting infusion treatment.  [employer] cover sheet and [employer] created and delivered to patient to be completed and signed.  Currently no further questions or needs.

## 2022-06-21 NOTE — Assessment & Plan Note (Signed)
She will proceed with last treatment today Afterwards, I plan to order CT imaging next month for review Due to severe myalgias and arthralgias from Taxol, I plan dose reduction today

## 2022-06-21 NOTE — Assessment & Plan Note (Signed)
This is likely due to recent treatment. The patient denies recent history of bleeding such as epistaxis, hematuria or hematochezia. She is asymptomatic from the anemia. I will observe for now.   

## 2022-06-21 NOTE — Assessment & Plan Note (Signed)
She has diffuse myalgias and arthralgias likely exacerbated by recent treatment with Taxol Plan dose reduction She will continue tramadol

## 2022-06-21 NOTE — Progress Notes (Signed)
[facility] OFFICE PROGRESS NOTE  Patient Care Team: [name], MD as PCP - General (Internal Medicine) Debara Pickett Nadean Corwin, MD as PCP - Cardiology (Cardiology) [name], RN as [facility]  ASSESSMENT & PLAN:  Uterine cancer [facility]) She will proceed with last treatment today Afterwards, I plan to order CT imaging next month for review Due to severe myalgias and arthralgias from Taxol, I plan dose reduction today  Anemia due to antineoplastic chemotherapy This is likely due to recent treatment. The patient denies recent history of bleeding such as epistaxis, hematuria or hematochezia. She is asymptomatic from the anemia. I will observe for now.   Myalgia, multiple sites She has diffuse myalgias and arthralgias likely exacerbated by recent treatment with Taxol Plan dose reduction She will continue tramadol  Type 2 diabetes mellitus with neurological complications [facility]) She will continue medical management Her blood sugar is satisfactory  Hypomagnesemia She has mild hypomagnesemia She will continue oral magnesium replacement therapy  Orders Placed This Encounter  Procedures   CT ABDOMEN PELVIS W CONTRAST    Standing Status:   Future    Standing Expiration Date:   06/22/2023    Order Specific Question:   If indicated for the ordered procedure, I authorize the administration of contrast media per Radiology protocol    Answer:   Yes    Order Specific Question:   Preferred imaging location?    Answer:   [facility]    Order Specific Question:   Radiology Contrast Protocol - do NOT remove file path    Answer:   [email or web address]    All questions were answered. The patient knows to call the clinic with any problems, questions or concerns. The total time spent in the appointment was 40 minutes encounter with patients including review of chart and various tests results,  discussions about plan of care and coordination of care plan   [name], MD 06/21/2022 10:18 AM  INTERVAL HISTORY: Please see below for problem oriented charting. she returns for treatment follow-up to be seen prior to final treatment today With her last cycle of treatment, she has developed significant diffuse myalgias and arthralgias that lasted 2 to 3 days, resolved with tramadol She denies worsening symptoms of peripheral neuropathy No recent nausea vomiting  REVIEW OF SYSTEMS:   Constitutional: Denies fevers, chills or abnormal weight loss Eyes: Denies blurriness of vision Ears, nose, mouth, throat, and face: Denies mucositis or sore throat Respiratory: Denies cough, dyspnea or wheezes Cardiovascular: Denies palpitation, chest discomfort or lower extremity swelling Gastrointestinal:  Denies nausea, heartburn or change in bowel habits Skin: Denies abnormal skin rashes Lymphatics: Denies new lymphadenopathy or easy bruising Neurological:Denies numbness, tingling or new weaknesses Behavioral/Psych: Mood is stable, no new changes  All other systems were reviewed with the patient and are negative.  I have reviewed the past medical history, past surgical history, social history and family history with the patient and they are unchanged from previous note.  ALLERGIES:  is allergic to codeine sulfate, penicillins, percocet [oxycodone-acetaminophen], and zestril [lisinopril].  MEDICATIONS:  Current Outpatient Medications  Medication Sig Dispense Refill   acetaminophen (TYLENOL) 500 MG tablet Take 1,000 mg by mouth every 6 (six) hours as needed for mild pain.     albuterol (VENTOLIN HFA) 108 (90 Base) MCG/ACT inhaler Inhale 2 puffs into the lungs every 4 (four) hours as needed for wheezing or shortness of breath. 8 g 0   atorvastatin (LIPITOR) 20 MG tablet Take  1 tablet (20 mg total) by mouth at bedtime. 30 tablet 11   B Complex-C (B-COMPLEX WITH VITAMIN C) tablet Take 1 tablet by  mouth in the morning.     Blood Glucose Monitoring Suppl (ONE TOUCH ULTRA MINI) w/Device KIT Please use as directed. 1 each 0   Cholecalciferol 1000 units capsule Take 1 capsule (1,000 Units total) by mouth daily. 90 capsule 1   dexamethasone (DECADRON) 4 MG tablet Take 2 tablets by mouth the night before and 2 tablets the morning of chemotherapy, every 3 weeks for 6 cycles 36 tablet 6   diltiazem (CARDIZEM CD) 120 MG 24 hr capsule Take 1 capsule (120 mg total) by mouth daily. 30 capsule 2   dorzolamide-timolol (COSOPT) 22.3-6.8 MG/ML ophthalmic solution Place 1 drop into both eyes 2 (two) times daily.     glucose blood (ONE TOUCH ULTRA TEST) test strip Use as instructed 100 each 1   Insulin Pen Needle 32G X 4 MM MISC 1 each by Does not apply route daily. 100 each 3   latanoprost (XALATAN) 0.005 % ophthalmic solution Place 1 drop into both eyes at bedtime.     lidocaine-prilocaine (EMLA) cream Apply to affected area once 30 g 3   liraglutide (VICTOZA) 18 MG/3ML SOPN PLEASE START TAKING VICTOZA 0.$RemoveBeforeDE'6MG'XFefuNvOgGNPwSN$  DAILY WEEK 1, THEN TAKE 1.$RemoveBef'2MG'EhrOMlPhDH$  DAILY WEEK 2, AND THEN TAKE 1.$RemoveBef'8MG'VSNKTEUcXK$  DAILY THEREAFTER (Patient taking differently: Inject 1.2 mg into the skin daily.) 9 mL 3   losartan (COZAAR) 100 MG tablet Take 100 mg by mouth at bedtime.     magnesium oxide (MAG-OX) 400 (240 Mg) MG tablet Take 1 tablet by mouth 2 times daily. 60 tablet 1   metFORMIN (GLUCOPHAGE) 1000 MG tablet Take 1 tablet (1,000 mg total) by mouth 2 (two) times daily. 120 tablet 3   ondansetron (ZOFRAN) 8 MG tablet Take 1 tablet (8 mg total) by mouth 2 (two) times daily as needed. Start on the third day after chemotherapy. 30 tablet 1   ONETOUCH DELICA LANCETS 54O MISC Please use as directed. 100 each 0   pantoprazole (PROTONIX) 40 MG tablet Take 1 tablet by mouth daily. 30 tablet 1   polyethylene glycol powder (GLYCOLAX/MIRALAX) 17 GM/SCOOP powder Take 17 g by mouth daily. 238 g 1   prochlorperazine (COMPAZINE) 10 MG tablet Take 1 tablet (10 mg total)  by mouth every 6 (six) hours as needed (Nausea or vomiting). 30 tablet 1   rivaroxaban (XARELTO) 20 MG TABS tablet Take 1 tablet (20 mg total) by mouth daily with supper. 30 tablet 1   traMADol (ULTRAM) 50 MG tablet Take 1 tablet (50 mg total) by mouth every 6 (six) hours as needed for severe pain. 30 tablet 0   No current facility-administered medications for this visit.    SUMMARY OF ONCOLOGIC HISTORY: Oncology History Overview Note  High grade serous, Her2/neu neg, MMR normal, MSI stable   Uterine cancer ([place])  10/12/2021 Imaging   US pelvis  1. Heterogeneous endometrial thickening with appearance of moderate complex fluid in the endometrial canal, possible hemorrhagic material. Endometrial thickness is considered abnormal for an asymptomatic post-menopausal female. Endometrial sampling should be considered to exclude carcinoma. 2. Nonvisualized ovary 3. Heterogeneous thick-walled urinary bladder, correlate for cystitis.   12/28/2021 Pathology Results   CYTOLOGY - NON PAP  CASE: [id number]  PATIENT: Sherri Gill  Non-Gynecological Cytology Report   Clinical History: Abnormal pelvic US, highly suspicious of malignant ascites  Specimen Submitted:  A. ASCITES, PARACENTESIS:    FINAL MICROSCOPIC  DIAGNOSIS:  - Malignant cells consistent with adenocarcinoma  - See comment   SPECIMEN ADEQUACY:  Satisfactory for evaluation   DIAGNOSTIC COMMENTS:  Immunohistochemical stains show that the tumor cells are positive for CK7 and PAX8 while they are negative for CK20, CDX2 and ER, consistent with above interpretation.  Additionally, the tumor cells are diffusely positive for p53 (clonal overexpression), suggestive of a high-grade serous carcinoma.    12/28/2021 Imaging   US pelvis 1. Persistent abnormal appearance of the endometrium, heterogeneously thickened and complex with possible intraluminal fluid. This is similar to that seen on December 2022 ultrasound. Endometrial thickness is  considered abnormal for an asymptomatic post-menopausal female. Endometrial sampling should be considered to exclude carcinoma. 2. Nonvisualization of the ovaries.   12/28/2021 Imaging   1. Large volume ascites. 2. Small umbilical hernia containing fat and ascites. 3. Haziness of anterior omentum most likely related to ascites, other etiologies such as metastatic disease can not be excluded. 4. Diffuse colonic diverticulosis without evidence for diverticulitis.   12/29/2021 Procedure   Successful ultrasound-guided paracentesis yielding 2.8 liters of peritoneal fluid.   01/14/2022 Initial Diagnosis   Uterine cancer ([place])   01/14/2022 Cancer Staging   Staging form: Corpus Uteri - Carcinoma and Carcinosarcoma, AJCC 8th Edition - Clinical: Stage III (cT3, cN0, cM0) - Signed by [name], MD on 01/14/2022 Stage prefix: Initial diagnosis   01/15/2022 Surgery   Surgery: Cervical dilation under ultrasound guidance, hysteroscopy, endometrial sampling using the Myosure   Surgeons:  Valarie Cones MD    Pathology: endometrial curetteings   Operative findings: On EUA, 8 cm mobile uterus, some nodularity along cul de sac. Cervix normal in appearance without discernible external os. Difficulty noted in dilating the cervix after using scalpel to incise in area of what what thought to be the external os. Under ultrasound guidance, cervix was dilated with confirmation on imaging of intra-uterine placement of dilators. On hysteroscopy, abnormal appearing endocervix and very calcified tissue obscuring good visualization of endometrium concerning for replacement by tumor.    01/15/2022 Pathology Results   FINAL MICROSCOPIC DIAGNOSIS:   A.   ENDOMETRIUM, CURETTAGE:  -    Serous endometrial carcinoma.   COMMENT:   The carcinoma is strongly and diffusely positive for p53 and diffuse p16 positivity.  The ER shows relatively diffuse positivity.  Overall, the histologic features (slit-like glandular spaces and  psammoma bodies) and the immunohistochemical phenotype are most characteristic for a serous carcinoma.    01/21/2022 Procedure   Placement of single lumen port a cath via right internal jugular vein. The catheter tip lies at the cavo-atrial junction. A power injectable port a cath was placed and is ready for immediate use.     01/25/2022 -  Chemotherapy   Patient is on Treatment Plan : UTERINE Carboplatin AUC 6 / Paclitaxel q21d     03/21/2022 Imaging   1. Near complete resolution of previously noted ascites, with only small volume residual perihepatic ascites. 2. Improved omental caking and stranding in the ventral abdomen. 3. Findings are consistent with treatment response of peritoneal metastatic disease. 4. Thickening of the urinary bladder wall , consistent with nonspecific infectious or inflammatory cystitis. Correlate with urinalysis.   Aortic Atherosclerosis (ICD10-I70.0).       03/22/2022 Tumor Marker   Patient's tumor was tested for the following markers: CA-125. Results of the tumor marker test revealed 21.5.   04/09/2022 Surgery     Surgery: Diagnostic laparoscopy, conversion to exploratory laparotomy, partial omentectomy, lysis of adhesions for  approximately 60 minutes, total abdominal hysterectomy, bilateral salpingo-oophorectomy, excision of ileal tumor implant and excision of transverse colon epiploica tumor implant   04/09/2022 Pathology Results   A.   OMENTUM, RESECTION:  -    Metastatic serous carcinoma.   B.   ILEAL TUMOR IMPLANTS, EXCISION:  -    Adhesions with small focus of metastatic serous carcinoma, possibly  intralymphatic.   C.   UTERUS, CERVIX, BILATERAL FALLOPIAN TUBES AND OVARIES:  -    Serous carcinoma, endometrial, invasive into outer half of uterus,  with involvement of  cervical stroma, uterine serosa, ovaries and fallopian tubes.   -    Negative for cervical dysplasia.  -    Leiomyomas, largest 1.2 cm in greatest dimension.   D.   COLON,  MESENTARY NODULE, TRANSVERSE, EXCISION:  -    Metastatic serous carcinoma   05/09/2022 Tumor Marker   Patient's tumor was tested for the following markers: CA-125. Results of the tumor marker test revealed 12.1.   05/29/2022 Tumor Marker   Patient's tumor was tested for the following markers: CA-125. Results of the tumor marker test revealed 9.1.     PHYSICAL EXAMINATION: ECOG PERFORMANCE STATUS: 1 - Symptomatic but completely ambulatory  Vitals:   06/21/22 0956  BP: (!) 161/74  Pulse: 72  Resp: 18  Temp: 98.3 F (36.8 C)  SpO2: 96%   Filed Weights   06/21/22 0956  Weight: 180 lb 9.6 oz (81.9 kg)    GENERAL:alert, no distress and comfortable NEURO: alert & oriented x 3 with fluent speech, no focal motor/sensory deficits  LABORATORY DATA:  I have reviewed the data as listed    Component Value Date/Time   NA 139 06/21/2022 0929   NA 139 01/10/2022 1644   K 4.1 06/21/2022 0929   CL 106 06/21/2022 0929   CO2 27 06/21/2022 0929   GLUCOSE 143 (H) 06/21/2022 0929   GLUCOSE 93 09/10/2006 0834   BUN 16 06/21/2022 0929   BUN 13 01/10/2022 1644   CREATININE 0.65 06/21/2022 0929   CREATININE 0.64 07/13/2014 1621   CALCIUM 9.5 06/21/2022 0929   CALCIUM 9.7 03/29/2010 1554   PROT 6.9 06/21/2022 0929   PROT 7.6 11/09/2020 1156   ALBUMIN 4.2 06/21/2022 0929   ALBUMIN 4.4 11/09/2020 1156   AST 9 (L) 06/21/2022 0929   ALT 6 06/21/2022 0929   ALKPHOS 125 06/21/2022 0929   BILITOT 0.3 06/21/2022 0929   GFRNONAA >60 06/21/2022 0929   GFRNONAA >89 07/13/2014 1621   GFRAA 86 11/09/2020 1156   GFRAA >89 07/13/2014 1621    No results found for: "SPEP", "UPEP"  Lab Results  Component Value Date   WBC 7.9 06/21/2022   NEUTROABS 6.5 06/21/2022   HGB 8.4 (L) 06/21/2022   HCT 25.2 (L) 06/21/2022   MCV 88.7 06/21/2022   PLT 258 06/21/2022      Chemistry      Component Value Date/Time   NA 139 06/21/2022 0929   NA 139 01/10/2022 1644   K 4.1 06/21/2022 0929   CL 106  06/21/2022 0929   CO2 27 06/21/2022 0929   BUN 16 06/21/2022 0929   BUN 13 01/10/2022 1644   CREATININE 0.65 06/21/2022 0929   CREATININE 0.64 07/13/2014 1621      Component Value Date/Time   CALCIUM 9.5 06/21/2022 0929   CALCIUM 9.7 03/29/2010 1554   ALKPHOS 125 06/21/2022 0929   AST 9 (L) 06/21/2022 0929   ALT 6 06/21/2022 0929   BILITOT 0.3  06/21/2022 0929      

## 2022-06-21 NOTE — Assessment & Plan Note (Signed)
She will continue medical management Her blood sugar is satisfactory

## 2022-06-21 NOTE — Patient Instructions (Addendum)
[facility] ONCOLOG[facility]  Discharge Instructions: Thank you for choosing [facility] to provide your oncology and hematology care.   If you have a lab appointment with the [facility], please go directly to the [facility] and check in at the registration area.   Wear comfortable clothing and clothing appropriate for easy access to any Portacath or PICC line.   We strive to give you quality time with your provider. You may need to reschedule your appointment if you arrive late (15 or more minutes).  Arriving late affects you and other patients whose appointments are after yours.  Also, if you miss three or more appointments without notifying the office, you may be dismissed from the clinic at the provider's discretion.      For prescription refill requests, have your pharmacy contact our office and allow 72 hours for refills to be completed.    Today you received the following chemotherapy and/or immunotherapy agents : Taxol, Carboplatin      To help prevent nausea and vomiting after your treatment, we encourage you to take your nausea medication as directed.  BELOW ARE SYMPTOMS THAT SHOULD BE REPORTED IMMEDIATELY: *FEVER GREATER THAN 100.4 F (38 C) OR HIGHER *CHILLS OR SWEATING *NAUSEA AND VOMITING THAT IS NOT CONTROLLED WITH YOUR NAUSEA MEDICATION *UNUSUAL SHORTNESS OF BREATH *UNUSUAL BRUISING OR BLEEDING *URINARY PROBLEMS (pain or burning when urinating, or frequent urination) *BOWEL PROBLEMS (unusual diarrhea, constipation, pain near the anus) TENDERNESS IN MOUTH AND THROAT WITH OR WITHOUT PRESENCE OF ULCERS (sore throat, sores in mouth, or a toothache) UNUSUAL RASH, SWELLING OR PAIN  UNUSUAL VAGINAL DISCHARGE OR ITCHING   Items with * indicate a potential emergency and should be followed up as soon as possible or go to the Emergency Department if any problems should occur.  Please show the CHEMOTHERAPY ALERT CARD or IMMUNOTHERAPY ALERT CARD at  check-in to the Emergency Department and triage nurse.  Should you have questions after your visit or need to cancel or reschedule your appointment, please contact [facility]  Dept: [phone]  and follow the prompts.  Office hours are 8:00 a.m. to 4:30 p.m. Monday - Friday. Please note that voicemails left after 4:00 p.m. may not be returned until the following business day.  We are closed weekends and major holidays. You have access to a nurse at all times for urgent questions. Please call the main number to the clinic Dept: [phone] and follow the prompts.   For any non-urgent questions, you may also contact your provider using MyChart. We now offer e-Visits for anyone 57 and older to request care online for non-urgent symptoms. For details visit [email or web address].   Also download the MyChart app! Go to the app store, search "MyChart", open the app, select [facility], and log in with your MyChart username and password.  Masks are optional in the cancer centers. If you would like for your care team to wear a mask while they are taking care of you, please let them know. You may have one support person who is at least 69 years old accompany you for your appointments. Hypomagnesemia Hypomagnesemia is a condition in which the level of magnesium in the blood is too low. Magnesium is a mineral that is found in many foods. It is used in many different processes in the body. Hypomagnesemia can affect every organ in the body. In severe cases, it can cause life-threatening problems. What are the causes? This condition may  be caused by: Not getting enough magnesium in your diet or not having enough healthy foods to eat (malnutrition). Problems with magnesium absorption in the intestines. Dehydration. Excessive use of alcohol. Vomiting. Severe or long-term (chronic) diarrhea. Some medicines, including medicines that make you urinate more often  (diuretics). Certain diseases, such as kidney disease, diabetes, celiac disease, and overactive thyroid. What are the signs or symptoms? Symptoms of this condition include: Loss of appetite, nausea, and vomiting. Involuntary shaking or trembling of a body part (tremor). Muscle weakness or tingling in the arms and legs. Sudden tightening of muscles (muscle spasms). Confusion. Psychiatric issues, such as: Depression and irritability. Psychosis. A feeling of fluttering of the heart (palpitations). Seizures. These symptoms are more severe if magnesium levels drop suddenly. How is this diagnosed? This condition may be diagnosed based on: Your symptoms and medical history. A physical exam. Blood and urine tests. How is this treated? Treatment depends on the cause and the severity of the condition. It may be treated by: Taking a magnesium supplement. This can be taken in pill form. If the condition is severe, magnesium is usually given through an IV. Making changes to your diet. You may be directed to eat foods that have a lot of magnesium, such as green leafy vegetables, peas, beans, and nuts. Not drinking alcohol. If you are struggling not to drink, ask your health care provider for help. Follow these instructions at home: Eating and drinking     Make sure that your diet includes foods with magnesium. Foods that have a lot of magnesium in them include: Green leafy vegetables, such as spinach and broccoli. Beans and peas. Nuts and seeds, such as almonds and sunflower seeds. Whole grains, such as whole grain bread and fortified cereals. Drink fluids that contain salts and minerals (electrolytes), such as sports drinks, when you are active. Do not drink alcohol. General instructions Take over-the-counter and prescription medicines only as told by your health care provider. Take magnesium supplements as directed if your health care provider tells you to take them. Have your magnesium  levels monitored as told by your health care provider. Keep all follow-up visits. This is important. Contact a health care provider if: You get worse instead of better. Your symptoms return. Get help right away if: You develop severe muscle weakness. You have trouble breathing. You feel that your heart is racing. These symptoms may represent a serious problem that is an emergency. Do not wait to see if the symptoms will go away. Get medical help right away. Call your local emergency services (911 in the [place] Do not drive yourself to the hospital. Summary Hypomagnesemia is a condition in which the level of magnesium in the blood is too low. Hypomagnesemia can affect every organ in the body. Treatment may include eating more foods that contain magnesium, taking magnesium supplements, and not drinking alcohol. Have your magnesium levels monitored as told by your health care provider. This information is not intended to replace advice given to you by your health care provider. Make sure you discuss any questions you have with your health care provider. Document Revised: 03/13/2021 Document Reviewed: 03/13/2021 Elsevier Patient Education  [place].

## 2022-06-21 NOTE — Progress Notes (Signed)
Continue Carboplatin '460mg'$  per MD. Mag = 1.4. No magnesium IV supplement needed today.  Raul Del Ainsworth, [place], BCPS, BCOP 06/21/2022 10:35 AM

## 2022-06-24 ENCOUNTER — Telehealth: Payer: Self-pay

## 2022-06-24 NOTE — Telephone Encounter (Signed)
[name], Counseling Intern, attempted to reach patient by phone. There was no voicemail option. Sherri Gill will attempt to reach patient later this week.   Lysle Morales  Counseling Intern

## 2022-06-26 ENCOUNTER — Other Ambulatory Visit: Payer: Self-pay | Admitting: *Deleted

## 2022-06-27 ENCOUNTER — Telehealth: Payer: Self-pay

## 2022-06-27 NOTE — Telephone Encounter (Signed)
Lysle Morales, counseling intern, called to check in with patient. Counselor did not leave a message because the patient did not have a voicemail box set up.   Lysle Morales  Counseling Intern

## 2022-06-28 ENCOUNTER — Telehealth: Payer: Self-pay

## 2022-06-28 NOTE — Telephone Encounter (Signed)
Notified Patient of completion of Leave of Absence Form. Fax transmission confirmation received. Copy of Form mailed to Patient as requested. No other needs or concerns voiced at this time.

## 2022-07-02 ENCOUNTER — Other Ambulatory Visit (HOSPITAL_COMMUNITY): Payer: Self-pay

## 2022-07-02 ENCOUNTER — Other Ambulatory Visit: Payer: Self-pay | Admitting: Hematology and Oncology

## 2022-07-02 ENCOUNTER — Telehealth: Payer: Self-pay

## 2022-07-02 DIAGNOSIS — C55 Malignant neoplasm of uterus, part unspecified: Secondary | ICD-10-CM

## 2022-07-02 DIAGNOSIS — G8918 Other acute postprocedural pain: Secondary | ICD-10-CM

## 2022-07-02 MED ORDER — TRAMADOL HCL 50 MG PO TABS
50.0000 mg | ORAL_TABLET | Freq: Four times a day (QID) | ORAL | 0 refills | Status: DC | PRN
  Filled 2022-07-02: qty 30, 8d supply, fill #0

## 2022-07-02 MED ORDER — CIPROFLOXACIN HCL 250 MG PO TABS
250.0000 mg | ORAL_TABLET | Freq: Two times a day (BID) | ORAL | 0 refills | Status: DC
Start: 2022-07-02 — End: 2022-07-23
  Filled 2022-07-02: qty 6, 3d supply, fill #0

## 2022-07-02 NOTE — Telephone Encounter (Signed)
I will send tramadol and another antibiotic to pharmacy, if not better by Friday, call back and we will have to repeat Urine culture

## 2022-07-02 NOTE — Telephone Encounter (Signed)
Returned her call. She is requesting Tramadol Rx refill to [facility] outpatient pharmacy.  Since Saturday she has been having urinary urgency and a hard time urinating. C/o pain to her lower abdomen. She does not think that she has fever. She feels that she is drinking enough and will try to drink more fluids. She said that she does not feel well and maybe a little confused, she just not sure. She said that she thinks that the antibiotics she took a few weeks ago did not clear up the UTI. Reminded her that date was 7/20 when the office obtained urine and she took antibiotic.

## 2022-07-02 NOTE — Telephone Encounter (Signed)
Called and given below message. She verbalized understanding and will call the office back Friday if she is not feeling better.

## 2022-07-03 ENCOUNTER — Other Ambulatory Visit: Payer: Self-pay

## 2022-07-03 ENCOUNTER — Telehealth: Payer: Self-pay

## 2022-07-03 DIAGNOSIS — E1149 Type 2 diabetes mellitus with other diabetic neurological complication: Secondary | ICD-10-CM

## 2022-07-03 MED ORDER — METFORMIN HCL 1000 MG PO TABS: 1000.0000 mg | ORAL_TABLET | Freq: Two times a day (BID) | ORAL | 3 refills | Status: DC

## 2022-07-03 NOTE — Telephone Encounter (Signed)
Attempted to call her back. She left a message asking for instructions on CT scan on 9/22. Unable to leave a message.

## 2022-07-04 ENCOUNTER — Other Ambulatory Visit: Payer: Self-pay

## 2022-07-04 ENCOUNTER — Encounter: Payer: Self-pay | Admitting: Hematology and Oncology

## 2022-07-04 ENCOUNTER — Telehealth: Payer: Self-pay

## 2022-07-04 NOTE — Telephone Encounter (Signed)
Sherri Gill, counseling intern, talked to patient via telephone. The patient expressed feeling stressed due to her care taking responsibilities of her adult son who is diagnosed with schizophrenia.   The patient was out walking for the first time since their surgery and reported that walking helps them to reduce their stress.   The patient set up a phone meeting with the counselor next week Monday, September 11th, at [place].   The counselor will mail the patient a copy of their Professional Disclosure Statement to be signed and returned.   Sherri Gill  Counseling Intern

## 2022-07-05 ENCOUNTER — Other Ambulatory Visit: Payer: Self-pay

## 2022-07-08 ENCOUNTER — Telehealth: Payer: Self-pay

## 2022-07-08 NOTE — Telephone Encounter (Signed)
Sherri Gill, counseling intern, attempted to call patient at scheduled time but the patient did not pick up the phone.   Counselor will reach out again this week.   Lysle Morales  Counseling Intern

## 2022-07-11 ENCOUNTER — Telehealth: Payer: Self-pay

## 2022-07-11 NOTE — Telephone Encounter (Signed)
Lysle Morales, counseling intern, attempted to call but could not reach client. Counselor will try to call patient again next week.   Lysle Morales  Counseling intern

## 2022-07-15 ENCOUNTER — Telehealth: Payer: Self-pay

## 2022-07-15 NOTE — Telephone Encounter (Signed)
Lysle Morales, counseling intern, called patient to reschedule missed appointment.   The patient did not answer the phone and the mailbox was not set up to receive voice messages.   The patient knows they can reach the counselor at [phone].   Lysle Morales  Counseling Intern

## 2022-07-16 ENCOUNTER — Encounter: Payer: Self-pay | Admitting: Dietician

## 2022-07-19 ENCOUNTER — Inpatient Hospital Stay: Payer: Medicare HMO | Attending: Gynecologic Oncology

## 2022-07-19 ENCOUNTER — Telehealth: Payer: Self-pay | Admitting: Internal Medicine

## 2022-07-19 ENCOUNTER — Other Ambulatory Visit: Payer: Self-pay | Admitting: Internal Medicine

## 2022-07-19 ENCOUNTER — Other Ambulatory Visit: Payer: Self-pay

## 2022-07-19 ENCOUNTER — Ambulatory Visit (HOSPITAL_COMMUNITY)
Admission: RE | Admit: 2022-07-19 | Discharge: 2022-07-19 | Disposition: A | Discharge status: Home / Self Care | Payer: Medicare HMO | Source: Ambulatory Visit | Attending: Hematology and Oncology | Admitting: Hematology and Oncology

## 2022-07-19 ENCOUNTER — Other Ambulatory Visit (HOSPITAL_COMMUNITY): Payer: Self-pay

## 2022-07-19 ENCOUNTER — Inpatient Hospital Stay: Payer: Medicare HMO

## 2022-07-19 DIAGNOSIS — Z9221 Personal history of antineoplastic chemotherapy: Secondary | ICD-10-CM | POA: Insufficient documentation

## 2022-07-19 DIAGNOSIS — K5730 Diverticulosis of large intestine without perforation or abscess without bleeding: Secondary | ICD-10-CM | POA: Diagnosis not present

## 2022-07-19 DIAGNOSIS — C55 Malignant neoplasm of uterus, part unspecified: Secondary | ICD-10-CM | POA: Diagnosis present

## 2022-07-19 DIAGNOSIS — E1149 Type 2 diabetes mellitus with other diabetic neurological complication: Secondary | ICD-10-CM | POA: Insufficient documentation

## 2022-07-19 DIAGNOSIS — Z79899 Other long term (current) drug therapy: Secondary | ICD-10-CM | POA: Insufficient documentation

## 2022-07-19 DIAGNOSIS — D509 Iron deficiency anemia, unspecified: Secondary | ICD-10-CM

## 2022-07-19 DIAGNOSIS — I4891 Unspecified atrial fibrillation: Secondary | ICD-10-CM | POA: Insufficient documentation

## 2022-07-19 DIAGNOSIS — C541 Malignant neoplasm of endometrium: Secondary | ICD-10-CM | POA: Diagnosis not present

## 2022-07-19 DIAGNOSIS — Z794 Long term (current) use of insulin: Secondary | ICD-10-CM | POA: Insufficient documentation

## 2022-07-19 DIAGNOSIS — Z7984 Long term (current) use of oral hypoglycemic drugs: Secondary | ICD-10-CM | POA: Insufficient documentation

## 2022-07-19 DIAGNOSIS — Z7985 Long-term (current) use of injectable non-insulin antidiabetic drugs: Secondary | ICD-10-CM | POA: Insufficient documentation

## 2022-07-19 DIAGNOSIS — D6481 Anemia due to antineoplastic chemotherapy: Secondary | ICD-10-CM | POA: Insufficient documentation

## 2022-07-19 LAB — CBC WITH DIFFERENTIAL (CANCER CENTER ONLY)
Abs Immature Granulocytes: 0.02 10*3/uL (ref 0.00–0.07)
Basophils Absolute: 0 10*3/uL (ref 0.0–0.1)
Basophils Relative: 0 %
Eosinophils Absolute: 0 10*3/uL (ref 0.0–0.5)
Eosinophils Relative: 1 %
HCT: 28.6 % — ABNORMAL LOW (ref 36.0–46.0)
Hemoglobin: 9.3 g/dL — ABNORMAL LOW (ref 12.0–15.0)
Immature Granulocytes: 0 %
Lymphocytes Relative: 32 %
Lymphs Abs: 2 10*3/uL (ref 0.7–4.0)
MCH: 29.1 pg (ref 26.0–34.0)
MCHC: 32.5 g/dL (ref 30.0–36.0)
MCV: 89.4 fL (ref 80.0–100.0)
Monocytes Absolute: 0.7 10*3/uL (ref 0.1–1.0)
Monocytes Relative: 11 %
Neutro Abs: 3.4 10*3/uL (ref 1.7–7.7)
Neutrophils Relative %: 56 %
Platelet Count: 205 10*3/uL (ref 150–400)
RBC: 3.2 MIL/uL — ABNORMAL LOW (ref 3.87–5.11)
RDW: 16.8 % — ABNORMAL HIGH (ref 11.5–15.5)
WBC Count: 6.2 10*3/uL (ref 4.0–10.5)
nRBC: 0 % (ref 0.0–0.2)

## 2022-07-19 LAB — CMP (CANCER CENTER ONLY)
ALT: 8 U/L (ref 0–44)
AST: 11 U/L — ABNORMAL LOW (ref 15–41)
Albumin: 4.4 g/dL (ref 3.5–5.0)
Alkaline Phosphatase: 134 U/L — ABNORMAL HIGH (ref 38–126)
Anion gap: 6 (ref 5–15)
BUN: 15 mg/dL (ref 8–23)
CO2: 29 mmol/L (ref 22–32)
Calcium: 9.7 mg/dL (ref 8.9–10.3)
Chloride: 103 mmol/L (ref 98–111)
Creatinine: 0.77 mg/dL (ref 0.44–1.00)
GFR, Estimated: >60 mL/min (ref >60)
Glucose, Bld: 80 mg/dL (ref 70–99)
Potassium: 3.6 mmol/L (ref 3.5–5.1)
Sodium: 138 mmol/L (ref 135–145)
Total Bilirubin: 0.4 mg/dL (ref 0.3–1.2)
Total Protein: 7.1 g/dL (ref 6.5–8.1)

## 2022-07-19 LAB — SAMPLE TO BLOOD BANK

## 2022-07-19 LAB — MAGNESIUM: Magnesium: 1.3 mg/dL — ABNORMAL LOW (ref 1.7–2.4)

## 2022-07-19 MED ORDER — RIVAROXABAN 20 MG PO TABS
20.0000 mg | ORAL_TABLET | Freq: Every day | ORAL | 1 refills | Status: DC
  Filled 2022-07-19: qty 30, 30d supply, fill #0

## 2022-07-19 MED ORDER — HEPARIN SOD (PORK) LOCK FLUSH 100 UNIT/ML IV SOLN
INTRAVENOUS | Status: AC
  Filled 2022-07-19: qty 5

## 2022-07-19 MED ORDER — HEPARIN SOD (PORK) LOCK FLUSH 100 UNIT/ML IV SOLN
500.0000 [IU] | Freq: Once | INTRAVENOUS | Status: AC
  Administered 2022-07-19: 500 [IU] via INTRAVENOUS

## 2022-07-19 MED ORDER — SODIUM CHLORIDE 0.9% FLUSH
10.0000 mL | Freq: Once | INTRAVENOUS | Status: AC
  Administered 2022-07-19: 10 mL

## 2022-07-19 MED ORDER — IOHEXOL 300 MG/ML  SOLN
100.0000 mL | Freq: Once | INTRAMUSCULAR | Status: AC | PRN
  Administered 2022-07-19: 100 mL via INTRAVENOUS

## 2022-07-19 MED ORDER — SODIUM CHLORIDE (PF) 0.9 % IJ SOLN
INTRAMUSCULAR | Status: AC
  Filled 2022-07-19: qty 50

## 2022-07-19 NOTE — Telephone Encounter (Signed)
*  STAT* If patient is at the pharmacy, call can be transferred to refill team.   1. Which medications need to be refilled? (please list name of each medication and dose if known) rivaroxaban (XARELTO) 20 MG TABS tablet  2. Which pharmacy/location (including street and city if local pharmacy) is medication to be sent to?  [facility]  3. Do they need a 30 day or 90 day supply? 90  Pt completely out

## 2022-07-19 NOTE — Telephone Encounter (Signed)
Prescription refill request for Xarelto received.   Indication: afib  Last office visit: 04/26/2022, Barbarann Ehlers Weight: 81.9 kg  Age: 71 yo  Scr: 0.77, (07/19/2022) CrCl: 34m/min   Refill sent.

## 2022-07-19 NOTE — Telephone Encounter (Signed)
Xarelto '20mg'$  refill request received. Pt is 71 years old, weight-81.9kg, Crea-0.77 on 07/19/2022, last seen by Ambrose Pancoast, NP on 04/26/2022, Diagnosis-Afib, CrCl-89.15 mL/min; Dose is appropriate based on dosing criteria. Will send in refill to requested pharmacy.

## 2022-07-20 ENCOUNTER — Other Ambulatory Visit (HOSPITAL_COMMUNITY): Payer: Self-pay

## 2022-07-20 LAB — CA 125: Cancer Antigen (CA) 125: 5.2 U/mL (ref 0.0–38.1)

## 2022-07-23 ENCOUNTER — Encounter: Payer: Self-pay | Admitting: Hematology and Oncology

## 2022-07-23 ENCOUNTER — Other Ambulatory Visit (HOSPITAL_COMMUNITY): Payer: Self-pay

## 2022-07-23 ENCOUNTER — Other Ambulatory Visit: Payer: Self-pay

## 2022-07-23 ENCOUNTER — Inpatient Hospital Stay: Payer: Medicare HMO | Admitting: Hematology and Oncology

## 2022-07-23 VITALS — BP 142/53 | HR 74 | Temp 98.7°F | Resp 18 | Ht 63.0 in | Wt 176.0 lb

## 2022-07-23 DIAGNOSIS — C55 Malignant neoplasm of uterus, part unspecified: Secondary | ICD-10-CM

## 2022-07-23 DIAGNOSIS — T451X5A Adverse effect of antineoplastic and immunosuppressive drugs, initial encounter: Secondary | ICD-10-CM

## 2022-07-23 DIAGNOSIS — E1149 Type 2 diabetes mellitus with other diabetic neurological complication: Secondary | ICD-10-CM

## 2022-07-23 DIAGNOSIS — I4891 Unspecified atrial fibrillation: Secondary | ICD-10-CM

## 2022-07-23 DIAGNOSIS — D6481 Anemia due to antineoplastic chemotherapy: Secondary | ICD-10-CM

## 2022-07-23 DIAGNOSIS — Z9221 Personal history of antineoplastic chemotherapy: Secondary | ICD-10-CM | POA: Diagnosis not present

## 2022-07-23 DIAGNOSIS — Z794 Long term (current) use of insulin: Secondary | ICD-10-CM | POA: Diagnosis not present

## 2022-07-23 DIAGNOSIS — Z79899 Other long term (current) drug therapy: Secondary | ICD-10-CM | POA: Diagnosis not present

## 2022-07-23 DIAGNOSIS — Z7984 Long term (current) use of oral hypoglycemic drugs: Secondary | ICD-10-CM | POA: Diagnosis not present

## 2022-07-23 DIAGNOSIS — Z7985 Long-term (current) use of injectable non-insulin antidiabetic drugs: Secondary | ICD-10-CM | POA: Diagnosis not present

## 2022-07-23 MED ORDER — MAGNESIUM OXIDE -MG SUPPLEMENT 400 (240 MG) MG PO TABS: 400.0000 mg | ORAL_TABLET | Freq: Every day | ORAL | 1 refills | Status: DC

## 2022-07-23 NOTE — Assessment & Plan Note (Signed)
She is on medical management along with anticoagulation therapy She will continue this and appointment to see her cardiologist soon

## 2022-07-23 NOTE — Assessment & Plan Note (Signed)
She has mild hypomagnesemia She will continue oral magnesium replacement therapy

## 2022-07-23 NOTE — Assessment & Plan Note (Signed)
I have reviewed multiple CT imaging with the patient She has complete response to therapy The changes noted in her peritoneum may not reflect active disease Tumor marker is normal I plan to see her again in 6 weeks for further follow-up and repeat imaging study at the end of the year

## 2022-07-23 NOTE — Progress Notes (Signed)
[place] OFFICE PROGRESS NOTE  Patient Care Team: [name], MD as PCP - General (Internal Medicine) Debara Pickett Nadean Corwin, MD as PCP - Cardiology (Cardiology) [name], RN as [facility]  ASSESSMENT & PLAN:  Uterine cancer [facility]) I have reviewed multiple CT imaging with the patient She has complete response to therapy The changes noted in her peritoneum may not reflect active disease Tumor marker is normal I plan to see her again in 6 weeks for further follow-up and repeat imaging study at the end of the year  Anemia due to antineoplastic chemotherapy She is not symptomatic I anticipate her anemia will improve in the future Observe only  Hypomagnesemia She has mild hypomagnesemia She will continue oral magnesium replacement therapy  Type 2 diabetes mellitus with neurological complications ([place]) We discussed association between diabetes and risk of cancer recurrence She appears motivated She will continue on her exercise regimen and dietary modification   New onset atrial fibrillation [facility]) She is on medical management along with anticoagulation therapy She will continue this and appointment to see her cardiologist soon  No orders of the defined types were placed in this encounter.   All questions were answered. The patient knows to call the clinic with any problems, questions or concerns. The total time spent in the appointment was 40 minutes encounter with patients including review of chart and various tests results, discussions about plan of care and coordination of care plan   [name], MD 07/23/2022 11:25 AM  INTERVAL HISTORY: Please see below for problem oriented charting. she returns for treatment follow-up and review of CT imaging results She tolerated last cycle of treatment well She asked a lot of questions related to CT imaging findings She denies significant neuropathy or bone pain from recent  treatment She appears to be exercising on a regular basis and have lost some weight since last time I saw her  REVIEW OF SYSTEMS:   Constitutional: Denies fevers, chills or abnormal weight loss Eyes: Denies blurriness of vision Ears, nose, mouth, throat, and face: Denies mucositis or sore throat Respiratory: Denies cough, dyspnea or wheezes Cardiovascular: Denies palpitation, chest discomfort or lower extremity swelling Gastrointestinal:  Denies nausea, heartburn or change in bowel habits Skin: Denies abnormal skin rashes Lymphatics: Denies new lymphadenopathy or easy bruising Neurological:Denies numbness, tingling or new weaknesses Behavioral/Psych: Mood is stable, no new changes  All other systems were reviewed with the patient and are negative.  I have reviewed the past medical history, past surgical history, social history and family history with the patient and they are unchanged from previous note.  ALLERGIES:  is allergic to codeine sulfate, penicillins, percocet [oxycodone-acetaminophen], and zestril [lisinopril].  MEDICATIONS:  Current Outpatient Medications  Medication Sig Dispense Refill   acetaminophen (TYLENOL) 500 MG tablet Take 1,000 mg by mouth every 6 (six) hours as needed for mild pain.     albuterol (VENTOLIN HFA) 108 (90 Base) MCG/ACT inhaler Inhale 2 puffs into the lungs every 4 (four) hours as needed for wheezing or shortness of breath. 8 g 0   atorvastatin (LIPITOR) 20 MG tablet Take 1 tablet (20 mg total) by mouth at bedtime. 30 tablet 11   B Complex-C (B-COMPLEX WITH VITAMIN C) tablet Take 1 tablet by mouth in the morning.     Blood Glucose Monitoring Suppl (ONE TOUCH ULTRA MINI) w/Device KIT Please use as directed. 1 each 0   Cholecalciferol 1000 units capsule Take 1 capsule (1,000 Units total) by  mouth daily. 90 capsule 1   diltiazem (CARDIZEM CD) 120 MG 24 hr capsule Take 1 capsule (120 mg total) by mouth daily. 30 capsule 2   dorzolamide-timolol (COSOPT)  22.3-6.8 MG/ML ophthalmic solution Place 1 drop into both eyes 2 (two) times daily.     glucose blood (ONE TOUCH ULTRA TEST) test strip Use as instructed 100 each 1   Insulin Pen Needle 32G X 4 MM MISC 1 each by Does not apply route daily. 100 each 3   latanoprost (XALATAN) 0.005 % ophthalmic solution Place 1 drop into both eyes at bedtime.     lidocaine-prilocaine (EMLA) cream Apply to affected area once 30 g 3   liraglutide (VICTOZA) 18 MG/3ML SOPN PLEASE START TAKING VICTOZA 0.6MG DAILY WEEK 1, THEN TAKE 1.2MG DAILY WEEK 2, AND THEN TAKE 1.8MG DAILY THEREAFTER (Patient taking differently: Inject 1.2 mg into the skin daily.) 9 mL 3   losartan (COZAAR) 100 MG tablet Take 100 mg by mouth at bedtime.     magnesium oxide (MAG-OX) 400 (240 Mg) MG tablet Take 1 tablet (400 mg total) by mouth daily. 60 tablet 1   metFORMIN (GLUCOPHAGE) 1000 MG tablet Take 1 tablet (1,000 mg total) by mouth 2 (two) times daily. 180 tablet 3   ONETOUCH DELICA LANCETS 10G MISC Please use as directed. 100 each 0   pantoprazole (PROTONIX) 40 MG tablet Take 1 tablet by mouth daily. 30 tablet 1   polyethylene glycol powder (GLYCOLAX/MIRALAX) 17 GM/SCOOP powder Take 17 g by mouth daily. 238 g 1   rivaroxaban (XARELTO) 20 MG TABS tablet Take 1 tablet (20 mg total) by mouth daily with supper. 90 tablet 1   No current facility-administered medications for this visit.    SUMMARY OF ONCOLOGIC HISTORY: Oncology History Overview Note  High grade serous, Her2/neu neg, MMR normal, MSI stable   Uterine cancer ([place])  10/12/2021 Imaging   US pelvis  1. Heterogeneous endometrial thickening with appearance of moderate complex fluid in the endometrial canal, possible hemorrhagic material. Endometrial thickness is considered abnormal for an asymptomatic post-menopausal female. Endometrial sampling should be considered to exclude carcinoma. 2. Nonvisualized ovary 3. Heterogeneous thick-walled urinary bladder, correlate for cystitis.    12/28/2021 Pathology Results   CYTOLOGY - NON PAP  CASE: [id number]  PATIENT: Sherri Gill  Non-Gynecological Cytology Report   Clinical History: Abnormal pelvic US, highly suspicious of malignant ascites  Specimen Submitted:  A. ASCITES, PARACENTESIS:    FINAL MICROSCOPIC DIAGNOSIS:  - Malignant cells consistent with adenocarcinoma  - See comment   SPECIMEN ADEQUACY:  Satisfactory for evaluation   DIAGNOSTIC COMMENTS:  Immunohistochemical stains show that the tumor cells are positive for CK7 and PAX8 while they are negative for CK20, CDX2 and ER, consistent with above interpretation.  Additionally, the tumor cells are diffusely positive for p53 (clonal overexpression), suggestive of a high-grade serous carcinoma.    12/28/2021 Imaging   US pelvis 1. Persistent abnormal appearance of the endometrium, heterogeneously thickened and complex with possible intraluminal fluid. This is similar to that seen on December 2022 ultrasound. Endometrial thickness is considered abnormal for an asymptomatic post-menopausal female. Endometrial sampling should be considered to exclude carcinoma. 2. Nonvisualization of the ovaries.   12/28/2021 Imaging   1. Large volume ascites. 2. Small umbilical hernia containing fat and ascites. 3. Haziness of anterior omentum most likely related to ascites, other etiologies such as metastatic disease can not be excluded. 4. Diffuse colonic diverticulosis without evidence for diverticulitis.   12/29/2021 Procedure  Successful ultrasound-guided paracentesis yielding 2.8 liters of peritoneal fluid.   01/14/2022 Initial Diagnosis   Uterine cancer ([place])   01/14/2022 Cancer Staging   Staging form: Corpus Uteri - Carcinoma and Carcinosarcoma, AJCC 8th Edition - Clinical: Stage III (cT3, cN0, cM0) - Signed by [name], MD on 01/14/2022 Stage prefix: Initial diagnosis   01/15/2022 Surgery   Surgery: Cervical dilation under ultrasound guidance, hysteroscopy,  endometrial sampling using the Myosure   Surgeons:  Valarie Cones MD    Pathology: endometrial curetteings   Operative findings: On EUA, 8 cm mobile uterus, some nodularity along cul de sac. Cervix normal in appearance without discernible external os. Difficulty noted in dilating the cervix after using scalpel to incise in area of what what thought to be the external os. Under ultrasound guidance, cervix was dilated with confirmation on imaging of intra-uterine placement of dilators. On hysteroscopy, abnormal appearing endocervix and very calcified tissue obscuring good visualization of endometrium concerning for replacement by tumor.    01/15/2022 Pathology Results   FINAL MICROSCOPIC DIAGNOSIS:   A.   ENDOMETRIUM, CURETTAGE:  -    Serous endometrial carcinoma.   COMMENT:   The carcinoma is strongly and diffusely positive for p53 and diffuse p16 positivity.  The ER shows relatively diffuse positivity.  Overall, the histologic features (slit-like glandular spaces and psammoma bodies) and the immunohistochemical phenotype are most characteristic for a serous carcinoma.    01/21/2022 Procedure   Placement of single lumen port a cath via right internal jugular vein. The catheter tip lies at the cavo-atrial junction. A power injectable port a cath was placed and is ready for immediate use.     01/25/2022 - 06/21/2022 Chemotherapy   Patient is on Treatment Plan : UTERINE Carboplatin AUC 6 / Paclitaxel q21d     03/21/2022 Imaging   1. Near complete resolution of previously noted ascites, with only small volume residual perihepatic ascites. 2. Improved omental caking and stranding in the ventral abdomen. 3. Findings are consistent with treatment response of peritoneal metastatic disease. 4. Thickening of the urinary bladder wall , consistent with nonspecific infectious or inflammatory cystitis. Correlate with urinalysis.   Aortic Atherosclerosis (ICD10-I70.0).       03/22/2022 Tumor Marker    Patient's tumor was tested for the following markers: CA-125. Results of the tumor marker test revealed 21.5.   04/09/2022 Surgery     Surgery: Diagnostic laparoscopy, conversion to exploratory laparotomy, partial omentectomy, lysis of adhesions for approximately 60 minutes, total abdominal hysterectomy, bilateral salpingo-oophorectomy, excision of ileal tumor implant and excision of transverse colon epiploica tumor implant   04/09/2022 Pathology Results   A.   OMENTUM, RESECTION:  -    Metastatic serous carcinoma.   B.   ILEAL TUMOR IMPLANTS, EXCISION:  -    Adhesions with small focus of metastatic serous carcinoma, possibly  intralymphatic.   C.   UTERUS, CERVIX, BILATERAL FALLOPIAN TUBES AND OVARIES:  -    Serous carcinoma, endometrial, invasive into outer half of uterus,  with involvement of  cervical stroma, uterine serosa, ovaries and fallopian tubes.   -    Negative for cervical dysplasia.  -    Leiomyomas, largest 1.2 cm in greatest dimension.   D.   COLON, MESENTARY NODULE, TRANSVERSE, EXCISION:  -    Metastatic serous carcinoma   05/09/2022 Tumor Marker   Patient's tumor was tested for the following markers: CA-125. Results of the tumor marker test revealed 12.1.   05/29/2022 Tumor Marker   Patient's tumor  was tested for the following markers: CA-125. Results of the tumor marker test revealed 9.1.   07/23/2022 Imaging   1. No abdominopelvic lymphadenopathy.  2. Scattered foci of fat stranding in the anterior pelvic peritoneal fat without discrete solid implants. Mild smooth wall thickening of the pelvic peritoneum. These findings are nonspecific and could represent postsurgical/post treatment change, although residual peritoneal carcinomatosis is on the differential and close follow-up CT is suggested. 3. Moderate left colonic diverticulosis. 4. Aortic Atherosclerosis (ICD10-I70.0).   07/23/2022 Tumor Marker   Patient's tumor was tested for the following markers:  CA-125. Results of the tumor marker test revealed 5.2.     PHYSICAL EXAMINATION: ECOG PERFORMANCE STATUS: 1 - Symptomatic but completely ambulatory  Vitals:   07/23/22 0922  BP: (!) 142/53  Pulse: 74  Resp: 18  Temp: 98.7 F (37.1 C)  SpO2: 99%   Filed Weights   07/23/22 0922  Weight: 176 lb (79.8 kg)    GENERAL:alert, no distress and comfortable NEURO: alert & oriented x 3 with fluent speech, no focal motor/sensory deficits  LABORATORY DATA:  I have reviewed the data as listed    Component Value Date/Time   NA 138 07/19/2022 1221   NA 139 01/10/2022 1644   K 3.6 07/19/2022 1221   CL 103 07/19/2022 1221   CO2 29 07/19/2022 1221   GLUCOSE 80 07/19/2022 1221   GLUCOSE 93 09/10/2006 0834   BUN 15 07/19/2022 1221   BUN 13 01/10/2022 1644   CREATININE 0.77 07/19/2022 1221   CREATININE 0.64 07/13/2014 1621   CALCIUM 9.7 07/19/2022 1221   CALCIUM 9.7 03/29/2010 1554   PROT 7.1 07/19/2022 1221   PROT 7.6 11/09/2020 1156   ALBUMIN 4.4 07/19/2022 1221   ALBUMIN 4.4 11/09/2020 1156   AST 11 (L) 07/19/2022 1221   ALT 8 07/19/2022 1221   ALKPHOS 134 (H) 07/19/2022 1221   BILITOT 0.4 07/19/2022 1221   GFRNONAA >60 07/19/2022 1221   GFRNONAA >89 07/13/2014 1621   GFRAA 86 11/09/2020 1156   GFRAA >89 07/13/2014 1621    No results found for: "SPEP", "UPEP"  Lab Results  Component Value Date   WBC 6.2 07/19/2022   NEUTROABS 3.4 07/19/2022   HGB 9.3 (L) 07/19/2022   HCT 28.6 (L) 07/19/2022   MCV 89.4 07/19/2022   PLT 205 07/19/2022      Chemistry      Component Value Date/Time   NA 138 07/19/2022 1221   NA 139 01/10/2022 1644   K 3.6 07/19/2022 1221   CL 103 07/19/2022 1221   CO2 29 07/19/2022 1221   BUN 15 07/19/2022 1221   BUN 13 01/10/2022 1644   CREATININE 0.77 07/19/2022 1221   CREATININE 0.64 07/13/2014 1621      Component Value Date/Time   CALCIUM 9.7 07/19/2022 1221   CALCIUM 9.7 03/29/2010 1554   ALKPHOS 134 (H) 07/19/2022 1221   AST 11 (L)  07/19/2022 1221   ALT 8 07/19/2022 1221   BILITOT 0.4 07/19/2022 1221       RADIOGRAPHIC STUDIES: I have reviewed multiple imaging studies with the patient I have personally reviewed the radiological images as listed and agreed with the findings in the report. CT ABDOMEN PELVIS W CONTRAST  Result Date: 07/21/2022 CLINICAL DATA:  Endometrial cancer status post TAHBSO 04/09/2022 status post chemotherapy. Restaging. * Tracking Code: BO * EXAM: CT ABDOMEN AND PELVIS WITH CONTRAST TECHNIQUE: Multidetector CT imaging of the abdomen and pelvis was performed using the standard protocol following bolus  administration of intravenous contrast. RADIATION DOSE REDUCTION: This exam was performed according to the departmental dose-optimization program which includes automated exposure control, adjustment of the mA and/or kV according to patient size and/or use of iterative reconstruction technique. CONTRAST:  153m OMNIPAQUE IOHEXOL 300 MG/ML  SOLN COMPARISON:  03/19/2022 CT abdomen/pelvis. FINDINGS: Lower chest: No significant pulmonary nodules or acute consolidative airspace disease. Superior approach central venous catheter terminates at the cavoatrial junction. Hepatobiliary: Normal liver size. No liver mass. Normal gallbladder with no radiopaque cholelithiasis. No biliary ductal dilatation. Pancreas: Normal, with no mass or duct dilation. Spleen: Normal size. No mass. Adrenals/Urinary Tract: Normal adrenals. Normal kidneys with no hydronephrosis and no renal mass. Normal bladder. Stomach/Bowel: Normal non-distended stomach. Normal caliber small bowel with no small bowel wall thickening. Normal appendix. Oral contrast transits to the colon. Moderate left colonic diverticulosis with no large bowel wall thickening or acute pericolonic fat stranding. Vascular/Lymphatic: Mildly atherosclerotic nonaneurysmal abdominal aorta. Patent portal, splenic, hepatic and renal veins. No pathologically enlarged lymph nodes in the  abdomen or pelvis. Reproductive: Status post hysterectomy, with no discrete mass at the hysterectomy margin. No adnexal mass. Other: No pneumoperitoneum, ascites or focal fluid collection. Scattered foci of fat stranding in the anterior pelvic peritoneal fat, for example measuring 0.5 cm just the left of midline (series 2/image 40) and 0.5 cm thickness in the left lateral ventral peritoneal cavity (series 2/image 40). Mild smooth wall thickening of the pelvic peritoneum without discrete solid implants in the pelvic peritoneal cavity. Musculoskeletal: No aggressive appearing focal osseous lesions. Mild thoracolumbar spondylosis. IMPRESSION: 1. No abdominopelvic lymphadenopathy. 2. Scattered foci of fat stranding in the anterior pelvic peritoneal fat without discrete solid implants. Mild smooth wall thickening of the pelvic peritoneum. These findings are nonspecific and could represent postsurgical/post treatment change, although residual peritoneal carcinomatosis is on the differential and close follow-up CT is suggested. 3. Moderate left colonic diverticulosis. 4. Aortic Atherosclerosis (ICD10-I70.0). Electronically Signed   By: JIlona SorrelM.D.   On: 07/21/2022 20:52

## 2022-07-23 NOTE — Assessment & Plan Note (Signed)
She is not symptomatic I anticipate her anemia will improve in the future Observe only

## 2022-07-23 NOTE — Assessment & Plan Note (Signed)
We discussed association between diabetes and risk of cancer recurrence She appears motivated She will continue on her exercise regimen and dietary modification

## 2022-07-25 ENCOUNTER — Telehealth: Payer: Self-pay

## 2022-07-25 NOTE — Patient Outreach (Signed)
  Care Coordination   Follow Up Visit Note   07/25/2022 Name: Sherri Gill MRN: [id number] DOB: [birth year]  Sherri Gill is a 70 y.o. year old female who sees [name], Elaina Pattee, MD for primary care. I spoke with  [name] by phone today.  What matters to the patients health and wellness today?  Patient called this RNCM today to share the good news that her cancer follow up scan showed she is in remission.  Patient has started walking again and her blood pressure has gone down.    Goals Addressed   None     SDOH assessments and interventions completed:  Yes  SDOH Interventions Today    Flowsheet Row Most Recent Value  SDOH Interventions   Housing Interventions Intervention Not Indicated  Transportation Interventions Intervention Not Indicated        Care Coordination Interventions Activated:  Yes  Care Coordination Interventions:  Yes, provided   Follow up plan:  Patient to call this RNCM as needed for questions/support.    Encounter Outcome:  Pt. Visit Completed

## 2022-07-31 ENCOUNTER — Telehealth: Payer: Self-pay | Admitting: Internal Medicine

## 2022-07-31 ENCOUNTER — Other Ambulatory Visit: Payer: Self-pay | Admitting: Internal Medicine

## 2022-07-31 ENCOUNTER — Other Ambulatory Visit: Payer: Self-pay | Admitting: Gynecologic Oncology

## 2022-07-31 ENCOUNTER — Other Ambulatory Visit (HOSPITAL_COMMUNITY): Payer: Self-pay

## 2022-07-31 NOTE — Telephone Encounter (Signed)
*  STAT* If patient is at the pharmacy, call can be transferred to refill team.   1. Which medications need to be refilled? (please list name of each medication and dose if known)  diltiazem (CARDIZEM CD) 120 MG 24 hr capsule   2. Which pharmacy/location (including street and city if local pharmacy) is medication to be sent to? [place]  3. Do they need a 30 day or 90 day supply? 30 day   Patient has 2 tablets left.

## 2022-08-01 ENCOUNTER — Other Ambulatory Visit (HOSPITAL_COMMUNITY): Payer: Self-pay

## 2022-08-01 ENCOUNTER — Other Ambulatory Visit: Payer: Self-pay

## 2022-08-01 MED ORDER — DILTIAZEM HCL ER COATED BEADS 120 MG PO CP24
120.0000 mg | ORAL_CAPSULE | Freq: Every day | ORAL | 6 refills | Status: DC
  Filled 2022-08-01: qty 30, 30d supply, fill #0

## 2022-08-01 MED ORDER — DILTIAZEM HCL ER COATED BEADS 120 MG PO CP24: 120.0000 mg | ORAL_CAPSULE | Freq: Every day | ORAL | 6 refills | Status: DC

## 2022-08-14 ENCOUNTER — Ambulatory Visit: Payer: Medicare HMO | Attending: Internal Medicine | Admitting: Internal Medicine

## 2022-08-14 ENCOUNTER — Encounter: Payer: Self-pay | Admitting: Internal Medicine

## 2022-08-14 VITALS — BP 150/62 | HR 78 | Ht 63.0 in | Wt 184.0 lb

## 2022-08-14 DIAGNOSIS — E119 Type 2 diabetes mellitus without complications: Secondary | ICD-10-CM

## 2022-08-14 DIAGNOSIS — E782 Mixed hyperlipidemia: Secondary | ICD-10-CM | POA: Diagnosis not present

## 2022-08-14 DIAGNOSIS — Z7901 Long term (current) use of anticoagulants: Secondary | ICD-10-CM | POA: Diagnosis not present

## 2022-08-14 DIAGNOSIS — I480 Paroxysmal atrial fibrillation: Secondary | ICD-10-CM | POA: Diagnosis not present

## 2022-08-14 DIAGNOSIS — I10 Essential (primary) hypertension: Secondary | ICD-10-CM

## 2022-08-14 MED ORDER — ATENOLOL 50 MG PO TABS: 50.0000 mg | ORAL_TABLET | Freq: Every day | ORAL | 3 refills | Status: DC

## 2022-08-14 NOTE — Patient Instructions (Signed)
Medication Instructions:  START atenolol '50mg'$  daily   *If you need a refill on your cardiac medications before your next appointment, please call your pharmacy*   Follow-Up: At [facility], you and your health needs are our priority.  As part of our continuing mission to provide you with exceptional heart care, we have created designated Provider Care Teams.  These Care Teams include your primary Cardiologist (physician) and Advanced Practice Providers (APPs -  Physician Assistants and Nurse Practitioners) who all work together to provide you with the care you need, when you need it.  We recommend signing up for the patient portal called "MyChart".  Sign up information is provided on this After Visit Summary.  MyChart is used to connect with patients for Virtual Visits (Telemedicine).  Patients are able to view lab/test results, encounter notes, upcoming appointments, etc.  Non-urgent messages can be sent to your provider as well.   To learn more about what you can do with MyChart, go to [email or web address].    Your next appointment:   6 month(s)  The format for your next appointment:   In Person  Provider:   Pixie Casino, MD    ** CALL in December for an April 2024 appointment     Please check your BP at home 1-2 times daily. Check about 2 hours after taking BP meds. Please record and call office with readings or drop off your log for Dr. Debara Pickett to review  HOW TO TAKE YOUR BLOOD PRESSURE: Rest 5 minutes before taking your blood pressure. Don't smoke or drink caffeinated beverages for at least 30 minutes before. Take your blood pressure before (not after) you eat. Sit comfortably with your back supported and both feet on the floor (don't cross your legs). Elevate your arm to heart level on a table or a desk. Use the proper sized cuff. It should fit smoothly and snugly around your bare upper arm. There should be enough room to slip a fingertip under the cuff. The  bottom edge of the cuff should be 1 inch above the crease of the elbow. Ideally, take 3 measurements at one sitting and record the average.

## 2022-08-15 NOTE — Progress Notes (Signed)
OFFICE NOTE  Chief Complaint:  Follow-up monitor  Primary Care Physician: [name], MD  HPI:  Sherri Gill is a 71 y.o. female with a past medial history significant for atrial fibrillation, dyslipidemia, GERD, hypertension and diabetes with neuropathy who was recently hospitalized with abdominal pain and ascites and found to have endometrial cancer after paracentesis.  This was complicated by A-fib with RVR.  She was ultimately rate controlled, but not anticoagulated due to anemia requiring transfusion.  Echocardiogram showed EF of 60 to 65% with a trivial posterior pericardial effusion and normal biatrial size.  Ultimately she was placed on Xarelto and has recovered from her surgery.  She had undergone extensive chemotherapy since then.  She has concerns about being on long-term anticoagulation and when she saw Ambrose Pancoast, NP, he placed a monitor to evaluate her burden of atrial fibrillation.  I personally reviewed the monitor which showed that she is still having some atrial fibrillation but a low burden less than 1%.  She was on lower dose Xarelto but this was increased due to improvement in renal function to 20 mg daily.  I advised her to stay on this indefinitely.  For some reason she was on dual calcium channel blocker therapy (amlodipine and diltiazem.  I advise stopping the amlodipine as typically we do not use 2 different calcium channel blockers, but subsequently her blood pressure has gone higher.  She reports also some breakthrough palpitations.  PMHx:  Past Medical History:  Diagnosis Date   AKI (acute kidney injury) ([place])    History of   Ascites    09/2021 paracentesis with almost 3 L of fluid 12/2021 Paracentesis performed with 2.8 L   Chronic anemia 08/29/2012   Need colonoscopy or report. Need iron panel.    Dyslipidemia 06/30/2007   Dyspnea    Essential hypertension, benign 06/30/2007   GERD 06/30/2007   History of blood transfusion    History of fatty  infiltration of liver    History of migraine    Hx of Herpes simplex meningitis 2015   Also noted to have primary empty sella on imaging at this admission   Insomnia 06/11/2012   Major depressive disorder, recurrent episode, moderate with anxious distress ([place]) 06/30/2007   Nausea and vomiting 01/13/2022   Obesity, BMI 35-40 06/11/2012   Peripheral neuropathy 2/2 T2DM 12/28/2009   Primary empty sella syndrome ([place]) 2015   Noted on imaging during hospitalization for herpes meningitis; no pituitary mass, no hormone w/u at that time, hormonally asymptomatic   Type 2 diabetes mellitus with neurological complications ([place]) 30/06/2329    Past Surgical History:  Procedure Laterality Date   COLONOSCOPY W/ POLYPECTOMY  06/28/2004   DENTAL SURGERY     DILATATION & CURETTAGE/HYSTEROSCOPY WITH MYOSURE N/A 01/15/2022   Procedure: DILATATION & CURETTAGE/HYSTEROSCOPY WITH MYOSURE;  Surgeon: Lafonda Mosses, MD;  Location: [facility];  Service: Gynecology;  Laterality: N/A;  DO NOT OPEN HYSTEROSCOPY KIT   HYSTERECTOMY ABDOMINAL WITH SALPINGO-OOPHORECTOMY Bilateral 04/09/2022   Procedure: HYSTERECTOMY ABDOMINAL BILATERAL SALPINGO OOPHORECTOMY WITH OMENTECTOMY ,DEBULKING;  Surgeon: Lafonda Mosses, MD;  Location: [facility];  Service: Gynecology;  Laterality: Bilateral;   IR IMAGING GUIDED PORT INSERTION  01/18/2022   LAPAROSCOPY N/A 04/09/2022   Procedure: LAPAROSCOPY DIAGNOSTIC;  Surgeon: Lafonda Mosses, MD;  Location: [facility];  Service: Gynecology;  Laterality: N/A;   OPERATIVE ULTRASOUND N/A 01/15/2022   Procedure: OPERATIVE ULTRASOUND;  Surgeon: Lafonda Mosses, MD;  Location: [facility];  Service: Gynecology;  Laterality: N/A;   port a cath placement     TUBAL LIGATION     age 64    FAMHx:  Family History  Adopted: Yes  Problem Relation Age of Onset   Dementia Mother    Breast cancer Sister        paternal half-sister   Liver cancer Brother        maternal half-brother   Thyroid cancer  Maternal Aunt    Prostate cancer Maternal Uncle    Prostate cancer Cousin    Breast cancer Other    Ovarian cancer Neg Hx    Colon cancer Neg Hx    Endometrial cancer Neg Hx    Pancreatic cancer Neg Hx     SOCHx:   reports that she quit smoking about 43 years ago. Her smoking use included cigarettes. She has a 2.50 pack-year smoking history. She has never used smokeless tobacco. She reports that she does not drink alcohol and does not use drugs.  ALLERGIES:  Allergies  Allergen Reactions   Codeine Sulfate Nausea And Vomiting and Other (See Comments)    Dizziness   Penicillins Nausea And Vomiting   Percocet [Oxycodone-Acetaminophen] Nausea And Vomiting   Zestril [Lisinopril] Cough    ROS: Pertinent items noted in HPI and remainder of comprehensive ROS otherwise negative.  HOME MEDS: Current Outpatient Medications on File Prior to Visit  Medication Sig Dispense Refill   acetaminophen (TYLENOL) 500 MG tablet Take 1,000 mg by mouth every 6 (six) hours as needed for mild pain.     albuterol (VENTOLIN HFA) 108 (90 Base) MCG/ACT inhaler Inhale 2 puffs into the lungs every 4 (four) hours as needed for wheezing or shortness of breath. 8 g 0   atorvastatin (LIPITOR) 20 MG tablet Take 1 tablet (20 mg total) by mouth at bedtime. 30 tablet 11   B Complex-C (B-COMPLEX WITH VITAMIN C) tablet Take 1 tablet by mouth in the morning.     Blood Glucose Monitoring Suppl (ONE TOUCH ULTRA MINI) w/Device KIT Please use as directed. 1 each 0   Cholecalciferol 1000 units capsule Take 1 capsule (1,000 Units total) by mouth daily. 90 capsule 1   diltiazem (CARDIZEM CD) 120 MG 24 hr capsule Take 1 capsule (120 mg total) by mouth daily. 30 capsule 6   dorzolamide-timolol (COSOPT) 22.3-6.8 MG/ML ophthalmic solution Place 1 drop into both eyes 2 (two) times daily.     glucose blood (ONE TOUCH ULTRA TEST) test strip Use as instructed 100 each 1   Insulin Pen Needle 32G X 4 MM MISC 1 each by Does not apply route  daily. 100 each 3   latanoprost (XALATAN) 0.005 % ophthalmic solution Place 1 drop into both eyes at bedtime.     lidocaine-prilocaine (EMLA) cream Apply to affected area once 30 g 3   liraglutide (VICTOZA) 18 MG/3ML SOPN PLEASE START TAKING VICTOZA 0.6MG DAILY WEEK 1, THEN TAKE 1.2MG DAILY WEEK 2, AND THEN TAKE 1.8MG DAILY THEREAFTER (Patient taking differently: Inject 1.2 mg into the skin daily.) 9 mL 3   losartan (COZAAR) 100 MG tablet Take 100 mg by mouth at bedtime.     magnesium oxide (MAG-OX) 400 (240 Mg) MG tablet Take 1 tablet (400 mg total) by mouth daily. 60 tablet 1   metFORMIN (GLUCOPHAGE) 1000 MG tablet Take 1 tablet (1,000 mg total) by mouth 2 (two) times daily. 180 tablet 3   ONETOUCH DELICA LANCETS 38T MISC Please use as directed. 100 each 0   pantoprazole (  PROTONIX) 40 MG tablet Take 1 tablet by mouth daily. 30 tablet 1   polyethylene glycol powder (GLYCOLAX/MIRALAX) 17 GM/SCOOP powder Take 17 g by mouth daily. 238 g 1   rivaroxaban (XARELTO) 20 MG TABS tablet Take 1 tablet (20 mg total) by mouth daily with supper. 90 tablet 1   No current facility-administered medications on file prior to visit.    LABS/IMAGING: No results found for this or any previous visit (from the past 48 hour(s)). No results found.  LIPID PANEL:    Component Value Date/Time   CHOL 171 05/31/2021 1531   TRIG 161 (H) 05/31/2021 1531   HDL 65 05/31/2021 1531   CHOLHDL 2.6 05/31/2021 1531   CHOLHDL 3.2 07/13/2014 1621   VLDL 28 07/13/2014 1621   LDLCALC 79 05/31/2021 1531     WEIGHTS: Wt Readings from Last 3 Encounters:  08/14/22 184 lb (83.5 kg)  07/23/22 176 lb (79.8 kg)  06/21/22 180 lb 9.6 oz (81.9 kg)    VITALS: BP (!) 150/62   Pulse 78   Ht _0  (1.6 m)   Wt 184 lb (83.5 kg)   SpO2 99%   BMI 32.59 kg/m   EXAM: General appearance: alert and no distress Neck: no carotid bruit, no JVD, and thyroid not enlarged, symmetric, no tenderness/mass/nodules Lungs: clear to  auscultation bilaterally Heart: regular rate and rhythm Abdomen: soft, non-tender; bowel sounds normal; no masses,  no organomegaly Extremities: extremities normal, atraumatic, no cyanosis or edema Pulses: 2+ and symmetric Skin: Skin color, texture, turgor normal. No rashes or lesions Neurologic: Grossly normal Psych: Pleasant  EKG: Normal sinus rhythm at 78, biatrial enlargement- personally reviewed  ASSESSMENT: Paroxysmal atrial fibrillation, CHA2DS2-VASc score of 4, on Xarelto Hypertension, Dyslipidemia Type 2 diabetes with neuropathy  PLAN: 1.   Ms. Pederson has paroxysmal atrial fibrillation with a low burden of A-fib but should be anticoagulated indefinitely.  She did initially have some concerns about this but seems to be okay with continuing it.  Ultimately she could consider a Watchman procedure if she so desires.  Her blood pressure is not well controlled after stopping her amlodipine however we did not want her on dual calcium channel blocker therapy.  As she is having some breakthrough palpitations, will add atenolol 50 mg daily for both heart rate and blood pressure control.  Continue diltiazem and losartan.  Medication cost is a factor.  Plan follow-up in 6 months or sooner as necessary.  Pixie Casino, MD, [facility], [place] Director of the Advanced Lipid Disorders &  Cardiovascular Risk Reduction Clinic Diplomate of the American Board of Clinical Lipidology Attending Cardiologist  Direct Dial: [phone]  Fax: [phone]  Website:  [email or web address] 08/15/2022, 8:36 AM

## 2022-08-17 ENCOUNTER — Other Ambulatory Visit: Payer: Self-pay | Admitting: Physician Assistant

## 2022-08-17 ENCOUNTER — Telehealth: Payer: Self-pay | Admitting: Physician Assistant

## 2022-08-17 MED ORDER — HYDRALAZINE HCL 25 MG PO TABS: 25.0000 mg | ORAL_TABLET | Freq: Three times a day (TID) | ORAL | 1 refills | Status: DC

## 2022-08-17 NOTE — Telephone Encounter (Signed)
EMS came to see her and ECG showed sinus brady with no high grade block. She is feeling much better. I offered to start her on hydralazine '25mg'$  TID for BP and she was willing to try it.    PLAN: STOP ATENOLOL 50 mg daily AND START HYDRALAZINE '25mg'$ TID.   She will monitor HR/BP and call office with any further issues.   Angelena Form PA-C  [facility]

## 2022-08-17 NOTE — Telephone Encounter (Signed)
   Recently started on atenolol '50mg'$  daily for elevated BP. Norvasc had been discontinued as she was on dual CCB therapy with diltiazem for afib. She took her first dose of atenolol today and felt unwell and lightheaded. BP was 144/66 with a HR of 39 bpm. She is very worried about her pulse being so low. Keeps repeating "I don't want to die." I reassured her that she will be okay but she has already called 911.   She asks that I call her back in a few hours to make sure she "isn't dead."  I provided a lot of reassurance and told her to discontinue atenolol for now and I will check back in on her later after EMS has done an assessment.   Angelena Form PA-C  [facility]

## 2022-08-18 ENCOUNTER — Telehealth: Payer: Self-pay | Admitting: Student

## 2022-08-18 NOTE — Telephone Encounter (Signed)
   The patient called the after-hours line as she is very concerned about her blood pressure being elevated but is anxious about taking the new medication which was recommended yesterday. Reviewed recent office note by Dr. Debara Pickett and also phone notes from [place], [place] from 08/17/2022. Atenolol was stopped due to bradycardia and it was recommended to start Hydralazine. After reading possible side effects of the medication, she is very hesitant to be on this. Given her most recent BP was at 161/65, I did recommend that she start Hydralazine as previously recommended but we did review she could try taking 12.5 mg twice daily for a few days to make sure she tolerates this and then titrate. I encouraged her to make [place] aware of her BP trend over time. She voiced understanding of this and was appreciative of the return call.  Signed, Erma Heritage, PA-C 08/18/2022, 4:15 PM

## 2022-08-19 ENCOUNTER — Ambulatory Visit (INDEPENDENT_AMBULATORY_CARE_PROVIDER_SITE_OTHER): Payer: Medicare HMO

## 2022-08-19 DIAGNOSIS — Z23 Encounter for immunization: Secondary | ICD-10-CM | POA: Diagnosis not present

## 2022-08-21 ENCOUNTER — Encounter: Payer: Self-pay | Admitting: Internal Medicine

## 2022-08-21 NOTE — Progress Notes (Signed)
Feels great, except for new stress/anatomic UI which was present following surgery.  She will discuss with her cancer doctor at f/u.  Energy is returning-she walks for exercise when she can.  Sometimes feels exhausted and must rest, but recovers.  She is looking forward to returning to work, possibly after the first of the year.  Things are going well with her son.  Overall she is feeling very positive and grateful for the excellent team treating her cancer over the last several months.  She is now considered to be in remission!  BP (!) 149/60 (BP Location: Right Arm, Patient Position: Sitting, Cuff Size: Small)   Pulse 70   Temp 97.8 F (36.6 C) (Oral)   Ht '5\' 3"'$  (1.6 m)   Wt 184 lb 14.4 oz (83.9 kg)   SpO2 100%   BMI 32.75 kg/m  Looks wonderful - full affect, positive, no anxiety, expressive and conversational, no discomfort Feet 1+ dps, skin in good condition, no ulcers or calluses, sensation intact to monofilament Heart RRR, no extra beats.   L chest port palpable beneath skin, no inflammation  Problems addressed at today's visit:  Elevated alkaline phosphatase level Interval rechecks indicates periods of resolution; most recent level only mildly elevated.    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 Katy Fitch Taking Victoza and metformin with no problems.   Doing well, no changes  Essential hypertension Multiple changes in her blood pressure medicine since I last saw her in the spring.  She also developed postop paroxysmal A-fib for which she was referred to Dr. [facility] cardiology office, seen recently.  Taking diltiazem (for AF as well), losartan.  HCTZ was stopped many months ago when she had AKI, which has since resolved. Hydralazine 12.5 mg 3 times daily recently  started to replace atenolol 50 (which was started for a few palpitations, then stopped- due to symptomatic bradycardia.  BP today 149/60- pulse 70 after taking hydralazine 12.5 mg and diltiazem 120 mg this morning, and her losartan 100 mg last night.  She is willing to increase the hydralazine to 25 mg p.o. 3 times daily.  We will then discuss possible once daily alternatives to the hydralazine.  Possibilities include resuming HCTZ now that her renal function has recovered, or initiating spironolactone.  PAF (paroxysmal atrial fibrillation) ([place]) onset post-op Spring 2023 Currently in NSR.  Xarelto anticoagulation.  Diltiazem (replaces former amlodipine) prescribed for rate control (did not tolerate atenolol 50 mg daily which resulted in symptomatic bradycardia).  She has infrequent episodes of palpitations which resolves with deep breathing.  Notes an association with drinking caffeine, and she is changing to decaffeinated beverages.  Does not wish to return to cardiology.  Routine health maintenance Has had flu shot this year Needs mammogram - ordered (not able to complete last year) Needs colon ca screening - referred again (not able to complete last year) Has completed pneumococcal series (PP SV 23, PCV 20)  Major depressive disorder with single episode, in remission ([place]) Mood is stable and positive! Cancer is in remission and she is feeling well both mentally and physically.

## 2022-08-21 NOTE — Assessment & Plan Note (Signed)
Interval rechecks indicates periods of resolution; most recent level only mildly elevated.

## 2022-08-22 ENCOUNTER — Ambulatory Visit (INDEPENDENT_AMBULATORY_CARE_PROVIDER_SITE_OTHER): Payer: Medicare HMO | Admitting: Internal Medicine

## 2022-08-22 VITALS — BP 149/60 | HR 70 | Temp 97.8°F | Ht 63.0 in | Wt 184.9 lb

## 2022-08-22 DIAGNOSIS — R748 Abnormal levels of other serum enzymes: Secondary | ICD-10-CM

## 2022-08-22 DIAGNOSIS — I10 Essential (primary) hypertension: Secondary | ICD-10-CM | POA: Diagnosis not present

## 2022-08-22 DIAGNOSIS — I480 Paroxysmal atrial fibrillation: Secondary | ICD-10-CM

## 2022-08-22 DIAGNOSIS — H42 Glaucoma in diseases classified elsewhere: Secondary | ICD-10-CM | POA: Diagnosis not present

## 2022-08-22 DIAGNOSIS — Z7984 Long term (current) use of oral hypoglycemic drugs: Secondary | ICD-10-CM

## 2022-08-22 DIAGNOSIS — Z860100 Personal history of colon polyps, unspecified: Secondary | ICD-10-CM

## 2022-08-22 DIAGNOSIS — E1149 Type 2 diabetes mellitus with other diabetic neurological complication: Secondary | ICD-10-CM | POA: Diagnosis not present

## 2022-08-22 DIAGNOSIS — R69 Illness, unspecified: Secondary | ICD-10-CM | POA: Diagnosis not present

## 2022-08-22 DIAGNOSIS — E1139 Type 2 diabetes mellitus with other diabetic ophthalmic complication: Secondary | ICD-10-CM

## 2022-08-22 DIAGNOSIS — Z0000 Encounter for general adult medical examination without abnormal findings: Secondary | ICD-10-CM

## 2022-08-22 DIAGNOSIS — F325 Major depressive disorder, single episode, in full remission: Secondary | ICD-10-CM

## 2022-08-22 LAB — POCT GLYCOSYLATED HEMOGLOBIN (HGB A1C): Hemoglobin A1C: 6 % — AB (ref 4.0–5.6)

## 2022-08-22 LAB — GLUCOSE, CAPILLARY: Glucose-Capillary: 143 mg/dL — ABNORMAL HIGH (ref 70–99)

## 2022-08-22 NOTE — Patient Instructions (Addendum)
Ms. Angelic, [place] to see you again, doing spectacularly well!  We spoke about many things today. I'm amazed at your story and progress, and grateful for your excellent team of doctors over the last several months!  Let's take a whole tablet (25 mg) of the hydralazine which I think will get you almost to the goal of less than 140 (top number).  With time, our goal will be lower, but let's take this step by step.  You requested refills for Xarelto to go to [facility] (and anything else you may need).  I'll call you to discuss the urine diabetes test and what it means.  It's time to get your mammogram and your colonoscopy done!  I have made referrals for both.  Let's get together in 3 months for sure, but I'll probably have the desk call you to make a BP recheck appt before that.  Take care and stay your awesome self!  Dr. [name]

## 2022-08-23 ENCOUNTER — Encounter: Payer: Self-pay | Admitting: Internal Medicine

## 2022-08-23 DIAGNOSIS — H42 Glaucoma in diseases classified elsewhere: Secondary | ICD-10-CM | POA: Insufficient documentation

## 2022-08-23 LAB — MICROALBUMIN / CREATININE URINE RATIO
Creatinine, Urine: 100.4 mg/dL
Microalb/Creat Ratio: 27 mg/g creat (ref 0–29)
Microalbumin, Urine: 27.4 ug/mL

## 2022-08-23 NOTE — Assessment & Plan Note (Signed)
Dr Katy Fitch visit 04/2022 (in media):  Lantaprost, dorzolamide - next visit 10/2022

## 2022-08-23 NOTE — Assessment & Plan Note (Addendum)
Multiple changes in her blood pressure medicine since I last saw her in the spring.  She also developed postop paroxysmal A-fib for which she was referred to Dr. [facility] cardiology office, seen recently.  Taking diltiazem (for AF as well), losartan.  HCTZ was stopped many months ago when she had AKI, which has since resolved. Hydralazine 12.5 mg 3 times daily recently started to replace atenolol 50 (which was started for a few palpitations, then stopped- due to symptomatic bradycardia.  BP today 149/60- pulse 70 after taking hydralazine 12.5 mg and diltiazem 120 mg this morning, and her losartan 100 mg last night.  She is willing to increase the hydralazine to 25 mg p.o. 3 times daily.  We will then discuss possible once daily alternatives to the hydralazine.  Possibilities include resuming HCTZ now that her renal function has recovered, or initiating spironolactone.

## 2022-08-23 NOTE — Assessment & Plan Note (Addendum)
Currently in NSR.  Xarelto anticoagulation.  Diltiazem (replaces former amlodipine) prescribed for rate control (did not tolerate atenolol 50 mg daily which resulted in symptomatic bradycardia).  She has infrequent episodes of palpitations which resolves with deep breathing.  Notes an association with drinking caffeine, and she is changing to decaffeinated beverages.  Does not wish to return to cardiology.

## 2022-08-23 NOTE — Assessment & Plan Note (Signed)
A1C today 6.0 Foot exam today UAC today No retinopathy on exam 04/2022 Dr. Katy Fitch Taking Victoza and metformin with no problems.   Doing well, no changes

## 2022-08-23 NOTE — Assessment & Plan Note (Signed)
Has had flu shot this year Needs mammogram - ordered the studies not able to complete last year) Needs colon ca screening - referred again (not able to complete last year) Has completed pneumococcal series (PP SV 23, PCV 20)

## 2022-08-23 NOTE — Assessment & Plan Note (Signed)
Mood is stable and positive! Cancer is in remission and she is feeling well both mentally and physically.

## 2022-08-26 ENCOUNTER — Other Ambulatory Visit: Payer: Self-pay

## 2022-08-26 DIAGNOSIS — C55 Malignant neoplasm of uterus, part unspecified: Secondary | ICD-10-CM

## 2022-08-26 DIAGNOSIS — I4891 Unspecified atrial fibrillation: Secondary | ICD-10-CM

## 2022-08-26 DIAGNOSIS — E1149 Type 2 diabetes mellitus with other diabetic neurological complication: Secondary | ICD-10-CM

## 2022-08-26 DIAGNOSIS — K5900 Constipation, unspecified: Secondary | ICD-10-CM

## 2022-08-28 ENCOUNTER — Other Ambulatory Visit (HOSPITAL_COMMUNITY): Payer: Self-pay

## 2022-08-28 ENCOUNTER — Other Ambulatory Visit (HOSPITAL_BASED_OUTPATIENT_CLINIC_OR_DEPARTMENT_OTHER): Payer: Self-pay

## 2022-08-28 ENCOUNTER — Other Ambulatory Visit: Payer: Self-pay | Admitting: Internal Medicine

## 2022-08-28 DIAGNOSIS — I4891 Unspecified atrial fibrillation: Secondary | ICD-10-CM

## 2022-08-28 DIAGNOSIS — E1149 Type 2 diabetes mellitus with other diabetic neurological complication: Secondary | ICD-10-CM

## 2022-08-28 MED ORDER — ATORVASTATIN CALCIUM 20 MG PO TABS: 20.0000 mg | ORAL_TABLET | Freq: Every evening | ORAL | 11 refills | Status: DC

## 2022-08-28 MED ORDER — METFORMIN HCL 1000 MG PO TABS: 1000.0000 mg | ORAL_TABLET | Freq: Two times a day (BID) | ORAL | 3 refills | Status: DC

## 2022-08-28 MED ORDER — LOSARTAN POTASSIUM 100 MG PO TABS: 100.0000 mg | ORAL_TABLET | Freq: Every day | ORAL | 0 refills | Status: DC

## 2022-08-28 MED ORDER — RIVAROXABAN 20 MG PO TABS: 20.0000 mg | ORAL_TABLET | Freq: Every day | ORAL | 1 refills | Status: DC

## 2022-08-28 MED ORDER — DILTIAZEM HCL ER COATED BEADS 120 MG PO CP24: 120.0000 mg | ORAL_CAPSULE | Freq: Every day | ORAL | 6 refills | Status: DC

## 2022-08-28 NOTE — Progress Notes (Signed)
Patient called on-call pager at 1800, as she needs refills on her medications. She last saw her PCP on 10/26. Patient needed refills on xarelto 20 mg, losartan 100 mg, metformin 1000 mg, cardizem 120 mg, and atorvastatin 20 mg. Refills sent to [facility] on [address].    Buddy Duty, DO Internal Medicine Resident, PGY-2

## 2022-08-29 ENCOUNTER — Other Ambulatory Visit: Payer: Self-pay | Admitting: Internal Medicine

## 2022-09-06 ENCOUNTER — Encounter: Payer: Self-pay | Admitting: Hematology and Oncology

## 2022-09-06 ENCOUNTER — Inpatient Hospital Stay: Payer: Medicare HMO | Admitting: Hematology and Oncology

## 2022-09-06 ENCOUNTER — Other Ambulatory Visit: Payer: Self-pay

## 2022-09-06 ENCOUNTER — Inpatient Hospital Stay: Payer: Medicare HMO | Attending: Gynecologic Oncology

## 2022-09-06 VITALS — BP 142/57 | HR 78 | Resp 18 | Ht 63.0 in | Wt 188.4 lb

## 2022-09-06 DIAGNOSIS — T451X5A Adverse effect of antineoplastic and immunosuppressive drugs, initial encounter: Secondary | ICD-10-CM | POA: Insufficient documentation

## 2022-09-06 DIAGNOSIS — K5730 Diverticulosis of large intestine without perforation or abscess without bleeding: Secondary | ICD-10-CM | POA: Insufficient documentation

## 2022-09-06 DIAGNOSIS — Z888 Allergy status to other drugs, medicaments and biological substances status: Secondary | ICD-10-CM | POA: Insufficient documentation

## 2022-09-06 DIAGNOSIS — Z79899 Other long term (current) drug therapy: Secondary | ICD-10-CM | POA: Insufficient documentation

## 2022-09-06 DIAGNOSIS — D6481 Anemia due to antineoplastic chemotherapy: Secondary | ICD-10-CM | POA: Diagnosis not present

## 2022-09-06 DIAGNOSIS — Z885 Allergy status to narcotic agent status: Secondary | ICD-10-CM | POA: Diagnosis not present

## 2022-09-06 DIAGNOSIS — Z7901 Long term (current) use of anticoagulants: Secondary | ICD-10-CM | POA: Diagnosis not present

## 2022-09-06 DIAGNOSIS — C541 Malignant neoplasm of endometrium: Secondary | ICD-10-CM | POA: Insufficient documentation

## 2022-09-06 DIAGNOSIS — K429 Umbilical hernia without obstruction or gangrene: Secondary | ICD-10-CM | POA: Diagnosis not present

## 2022-09-06 DIAGNOSIS — R188 Other ascites: Secondary | ICD-10-CM | POA: Insufficient documentation

## 2022-09-06 DIAGNOSIS — C55 Malignant neoplasm of uterus, part unspecified: Secondary | ICD-10-CM

## 2022-09-06 DIAGNOSIS — I700 Atherosclerosis of aorta: Secondary | ICD-10-CM | POA: Insufficient documentation

## 2022-09-06 DIAGNOSIS — Z880 Allergy status to penicillin: Secondary | ICD-10-CM | POA: Diagnosis not present

## 2022-09-06 LAB — CMP (CANCER CENTER ONLY)
ALT: 9 U/L (ref 0–44)
AST: 10 U/L — ABNORMAL LOW (ref 15–41)
Albumin: 4.1 g/dL (ref 3.5–5.0)
Alkaline Phosphatase: 166 U/L — ABNORMAL HIGH (ref 38–126)
Anion gap: 7 (ref 5–15)
BUN: 21 mg/dL (ref 8–23)
CO2: 29 mmol/L (ref 22–32)
Calcium: 9.6 mg/dL (ref 8.9–10.3)
Chloride: 105 mmol/L (ref 98–111)
Creatinine: 1.11 mg/dL — ABNORMAL HIGH (ref 0.44–1.00)
GFR, Estimated: 54 mL/min — ABNORMAL LOW (ref >60)
Glucose, Bld: 135 mg/dL — ABNORMAL HIGH (ref 70–99)
Potassium: 3.6 mmol/L (ref 3.5–5.1)
Sodium: 141 mmol/L (ref 135–145)
Total Bilirubin: 0.4 mg/dL (ref 0.3–1.2)
Total Protein: 7.4 g/dL (ref 6.5–8.1)

## 2022-09-06 LAB — CBC WITH DIFFERENTIAL (CANCER CENTER ONLY)
Abs Immature Granulocytes: 0.04 10*3/uL (ref 0.00–0.07)
Basophils Absolute: 0 10*3/uL (ref 0.0–0.1)
Basophils Relative: 0 %
Eosinophils Absolute: 0.1 10*3/uL (ref 0.0–0.5)
Eosinophils Relative: 1 %
HCT: 30.7 % — ABNORMAL LOW (ref 36.0–46.0)
Hemoglobin: 9.9 g/dL — ABNORMAL LOW (ref 12.0–15.0)
Immature Granulocytes: 1 %
Lymphocytes Relative: 24 %
Lymphs Abs: 1.8 10*3/uL (ref 0.7–4.0)
MCH: 29.3 pg (ref 26.0–34.0)
MCHC: 32.2 g/dL (ref 30.0–36.0)
MCV: 90.8 fL (ref 80.0–100.0)
Monocytes Absolute: 0.7 10*3/uL (ref 0.1–1.0)
Monocytes Relative: 9 %
Neutro Abs: 5 10*3/uL (ref 1.7–7.7)
Neutrophils Relative %: 65 %
Platelet Count: 292 10*3/uL (ref 150–400)
RBC: 3.38 MIL/uL — ABNORMAL LOW (ref 3.87–5.11)
RDW: 14.7 % (ref 11.5–15.5)
WBC Count: 7.6 10*3/uL (ref 4.0–10.5)
nRBC: 0 % (ref 0.0–0.2)

## 2022-09-06 LAB — MAGNESIUM: Magnesium: 1.6 mg/dL — ABNORMAL LOW (ref 1.7–2.4)

## 2022-09-06 MED ORDER — SODIUM CHLORIDE 0.9% FLUSH
10.0000 mL | Freq: Once | INTRAVENOUS | Status: AC
  Administered 2022-09-06: 10 mL

## 2022-09-06 MED ORDER — HEPARIN SOD (PORK) LOCK FLUSH 100 UNIT/ML IV SOLN
500.0000 [IU] | Freq: Once | INTRAVENOUS | Status: AC
  Administered 2022-09-06: 500 [IU]

## 2022-09-06 NOTE — Assessment & Plan Note (Addendum)
Her last CT imaging showed no evidence of active disease The changes noted in her peritoneum may not reflect active disease Tumor marker is normal I plan to see her again in 8 weeks for further follow-up and repeat imaging study after the new year

## 2022-09-06 NOTE — Progress Notes (Signed)
[name] OFFICE PROGRESS NOTE  Patient Care Team: [name], MD as PCP - General (Internal Medicine) Debara Pickett Nadean Corwin, MD as PCP - Cardiology (Cardiology) [name], RN as [place] Management  ASSESSMENT & PLAN:  Uterine cancer [facility]) Her last CT imaging showed no evidence of active disease The changes noted in her peritoneum may not reflect active disease Tumor marker is normal I plan to see her again in 8 weeks for further follow-up and repeat imaging study after the new year  Anemia due to antineoplastic chemotherapy This is improving She is not symptomatic Observe only  Hypomagnesemia This has improved I recommend the patient to continue taking oral magnesium  No orders of the defined types were placed in this encounter.   All questions were answered. The patient knows to call the clinic with any problems, questions or concerns. The total time spent in the appointment was 20 minutes encounter with patients including review of chart and various tests results, discussions about plan of care and coordination of care plan   [name], MD 09/06/2022 12:43 PM  INTERVAL HISTORY: Please see below for problem oriented charting. she returns for surveillance follow-up She is doing well Denies abdominal pain or changes in bowel habits She is active She denies signs or symptoms of anemia such as excessive fatigue or shortness of breath on exertion  REVIEW OF SYSTEMS:   Constitutional: Denies fevers, chills or abnormal weight loss Eyes: Denies blurriness of vision Ears, nose, mouth, throat, and face: Denies mucositis or sore throat Respiratory: Denies cough, dyspnea or wheezes Cardiovascular: Denies palpitation, chest discomfort or lower extremity swelling Gastrointestinal:  Denies nausea, heartburn or change in bowel habits Skin: Denies abnormal skin rashes Lymphatics: Denies new lymphadenopathy or easy  bruising Neurological:Denies numbness, tingling or new weaknesses Behavioral/Psych: Mood is stable, no new changes  All other systems were reviewed with the patient and are negative.  I have reviewed the past medical history, past surgical history, social history and family history with the patient and they are unchanged from previous note.  ALLERGIES:  is allergic to codeine sulfate, penicillins, percocet [oxycodone-acetaminophen], and zestril [lisinopril].  MEDICATIONS:  Current Outpatient Medications  Medication Sig Dispense Refill   acetaminophen (TYLENOL) 500 MG tablet Take 1,000 mg by mouth every 6 (six) hours as needed for mild pain.     albuterol (VENTOLIN HFA) 108 (90 Base) MCG/ACT inhaler Inhale 2 puffs into the lungs every 4 (four) hours as needed for wheezing or shortness of breath. 8 g 0   atorvastatin (LIPITOR) 20 MG tablet Take 1 tablet (20 mg total) by mouth at bedtime. 30 tablet 11   B Complex-C (B-COMPLEX WITH VITAMIN C) tablet Take 1 tablet by mouth in the morning.     Blood Glucose Monitoring Suppl (ONE TOUCH ULTRA MINI) w/Device KIT Please use as directed. 1 each 0   Cholecalciferol 1000 units capsule Take 1 capsule (1,000 Units total) by mouth daily. 90 capsule 1   diltiazem (CARDIZEM CD) 120 MG 24 hr capsule Take 1 capsule (120 mg total) by mouth daily. 30 capsule 6   dorzolamide-timolol (COSOPT) 22.3-6.8 MG/ML ophthalmic solution Place 1 drop into both eyes 2 (two) times daily.     glucose blood (ONE TOUCH ULTRA TEST) test strip Use as instructed 100 each 1   hydrALAZINE (APRESOLINE) 25 MG tablet Take 1 tablet (25 mg total) by mouth 3 (three) times daily. 90 tablet 1   Insulin Pen Needle 32G X 4  MM MISC 1 each by Does not apply route daily. 100 each 3   latanoprost (XALATAN) 0.005 % ophthalmic solution Place 1 drop into both eyes at bedtime.     lidocaine-prilocaine (EMLA) cream Apply to affected area once 30 g 3   liraglutide (VICTOZA) 18 MG/3ML SOPN PLEASE START  TAKING VICTOZA 0.6MG DAILY WEEK 1, THEN TAKE 1.2MG DAILY WEEK 2, AND THEN TAKE 1.8MG DAILY THEREAFTER (Patient taking differently: Inject 1.2 mg into the skin daily.) 9 mL 3   losartan (COZAAR) 100 MG tablet Take 1 tablet (100 mg total) by mouth at bedtime. 90 tablet 0   magnesium oxide (MAG-OX) 400 (240 Mg) MG tablet Take 1 tablet (400 mg total) by mouth daily. 60 tablet 1   metFORMIN (GLUCOPHAGE) 1000 MG tablet Take 1 tablet (1,000 mg total) by mouth 2 (two) times daily. 180 tablet 3   ONETOUCH DELICA LANCETS 35T MISC Please use as directed. 100 each 0   pantoprazole (PROTONIX) 40 MG tablet Take 1 tablet by mouth daily. 30 tablet 1   polyethylene glycol powder (GLYCOLAX/MIRALAX) 17 GM/SCOOP powder Take 17 g by mouth daily. 238 g 1   rivaroxaban (XARELTO) 20 MG TABS tablet Take 1 tablet (20 mg total) by mouth daily with supper. 90 tablet 1   No current facility-administered medications for this visit.    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 Heterogeneous endometrial thickening with appearance of moderate complex fluid in the endometrial canal, possible hemorrhagic material. Endometrial thickness is considered abnormal for an asymptomatic post-menopausal female. Endometrial sampling should be considered to exclude carcinoma. 2. Nonvisualized ovary 3. Heterogeneous thick-walled urinary bladder, correlate for cystitis.   12/28/2021 Pathology Results   CYTOLOGY - NON PAP  CASE: [id number]  PATIENT: Sherri Gill  Non-Gynecological Cytology Report   Clinical History: Abnormal pelvic US, highly suspicious of malignant ascites  Specimen Submitted:  A. ASCITES, PARACENTESIS:    FINAL MICROSCOPIC DIAGNOSIS:  - Malignant cells consistent with adenocarcinoma  - See comment   SPECIMEN ADEQUACY:  Satisfactory for evaluation   DIAGNOSTIC COMMENTS:   Immunohistochemical stains show that the tumor cells are positive for CK7 and PAX8 while they are negative for CK20, CDX2 and ER, consistent with above interpretation.  Additionally, the tumor cells are diffusely positive for p53 (clonal overexpression), suggestive of a high-grade serous carcinoma.    12/28/2021 Imaging   US pelvis 1. Persistent abnormal appearance of the endometrium, heterogeneously thickened and complex with possible intraluminal fluid. This is similar to that seen on December 2022 ultrasound. Endometrial thickness is considered abnormal for an asymptomatic post-menopausal female. Endometrial sampling should be considered to exclude carcinoma. 2. Nonvisualization of the ovaries.   12/28/2021 Imaging   1. Large volume ascites. 2. Small umbilical hernia containing fat and ascites. 3. Haziness of anterior omentum most likely related to ascites, other etiologies such as metastatic disease can not be excluded. 4. Diffuse colonic diverticulosis without evidence for diverticulitis.   12/29/2021 Procedure   Successful ultrasound-guided paracentesis yielding 2.8 liters of peritoneal fluid.   01/14/2022 Initial Diagnosis   Uterine cancer ([place])   01/14/2022 Cancer Staging   Staging form: Corpus Uteri - Carcinoma and Carcinosarcoma, AJCC 8th Edition - Clinical: Stage III (cT3, cN0, cM0) - Signed by [name], MD on 01/14/2022 Stage prefix: Initial diagnosis   01/15/2022 Surgery   Surgery: Cervical dilation under ultrasound  guidance, hysteroscopy, endometrial sampling using the Myosure   Surgeons:  Valarie Cones MD    Pathology: endometrial curetteings   Operative findings: On EUA, 8 cm mobile uterus, some nodularity along cul de sac. Cervix normal in appearance without discernible external os. Difficulty noted in dilating the cervix after using scalpel to incise in area of what what thought to be the external os. Under ultrasound guidance, cervix was dilated with confirmation on imaging  of intra-uterine placement of dilators. On hysteroscopy, abnormal appearing endocervix and very calcified tissue obscuring good visualization of endometrium concerning for replacement by tumor.    01/15/2022 Pathology Results   FINAL MICROSCOPIC DIAGNOSIS:   A.   ENDOMETRIUM, CURETTAGE:  -    Serous endometrial carcinoma.   COMMENT:   The carcinoma is strongly and diffusely positive for p53 and diffuse p16 positivity.  The ER shows relatively diffuse positivity.  Overall, the histologic features (slit-like glandular spaces and psammoma bodies) and the immunohistochemical phenotype are most characteristic for a serous carcinoma.    01/21/2022 Procedure   Placement of single lumen port a cath via right internal jugular vein. The catheter tip lies at the cavo-atrial junction. A power injectable port a cath was placed and is ready for immediate use.     01/25/2022 - 06/21/2022 Chemotherapy   Patient is on Treatment Plan : UTERINE Carboplatin AUC 6 / Paclitaxel q21d     03/21/2022 Imaging   1. Near complete resolution of previously noted ascites, with only small volume residual perihepatic ascites. 2. Improved omental caking and stranding in the ventral abdomen. 3. Findings are consistent with treatment response of peritoneal metastatic disease. 4. Thickening of the urinary bladder wall , consistent with nonspecific infectious or inflammatory cystitis. Correlate with urinalysis.   Aortic Atherosclerosis (ICD10-I70.0).       03/22/2022 Tumor Marker   Patient's tumor was tested for the following markers: CA-125. Results of the tumor marker test revealed 21.5.   04/09/2022 Surgery     Surgery: Diagnostic laparoscopy, conversion to exploratory laparotomy, partial omentectomy, lysis of adhesions for approximately 60 minutes, total abdominal hysterectomy, bilateral salpingo-oophorectomy, excision of ileal tumor implant and excision of transverse colon epiploica tumor implant   04/09/2022 Pathology  Results   A.   OMENTUM, RESECTION:  -    Metastatic serous carcinoma.   B.   ILEAL TUMOR IMPLANTS, EXCISION:  -    Adhesions with small focus of metastatic serous carcinoma, possibly  intralymphatic.   C.   UTERUS, CERVIX, BILATERAL FALLOPIAN TUBES AND OVARIES:  -    Serous carcinoma, endometrial, invasive into outer half of uterus,  with involvement of  cervical stroma, uterine serosa, ovaries and fallopian tubes.   -    Negative for cervical dysplasia.  -    Leiomyomas, largest 1.2 cm in greatest dimension.   D.   COLON, MESENTARY NODULE, TRANSVERSE, EXCISION:  -    Metastatic serous carcinoma   05/09/2022 Tumor Marker   Patient's tumor was tested for the following markers: CA-125. Results of the tumor marker test revealed 12.1.   05/29/2022 Tumor Marker   Patient's tumor was tested for the following markers: CA-125. Results of the tumor marker test revealed 9.1.   07/23/2022 Imaging   1. No abdominopelvic lymphadenopathy.  2. Scattered foci of fat stranding in the anterior pelvic peritoneal fat without discrete solid implants. Mild smooth wall thickening of the pelvic peritoneum. These findings are nonspecific and could represent postsurgical/post treatment change, although residual peritoneal carcinomatosis is on the  differential and close follow-up CT is suggested. 3. Moderate left colonic diverticulosis. 4. Aortic Atherosclerosis (ICD10-I70.0).   07/23/2022 Tumor Marker   Patient's tumor was tested for the following markers: CA-125. Results of the tumor marker test revealed 5.2.     PHYSICAL EXAMINATION: ECOG PERFORMANCE STATUS: 0 - Asymptomatic  Vitals:   09/06/22 1043  BP: (!) 142/57  Pulse: 78  Resp: 18  SpO2: 98%   Filed Weights   09/06/22 1043  Weight: 188 lb 6.4 oz (85.5 kg)    GENERAL:alert, no distress and comfortable NEURO: alert & oriented x 3 with fluent speech, no focal motor/sensory deficits  LABORATORY DATA:  I have reviewed the data as  listed    Component Value Date/Time   NA 141 09/06/2022 1011   NA 139 01/10/2022 1644   K 3.6 09/06/2022 1011   CL 105 09/06/2022 1011   CO2 29 09/06/2022 1011   GLUCOSE 135 (H) 09/06/2022 1011   GLUCOSE 93 09/10/2006 0834   BUN 21 09/06/2022 1011   BUN 13 01/10/2022 1644   CREATININE 1.11 (H) 09/06/2022 1011   CREATININE 0.64 07/13/2014 1621   CALCIUM 9.6 09/06/2022 1011   CALCIUM 9.7 03/29/2010 1554   PROT 7.4 09/06/2022 1011   PROT 7.6 11/09/2020 1156   ALBUMIN 4.1 09/06/2022 1011   ALBUMIN 4.4 11/09/2020 1156   AST 10 (L) 09/06/2022 1011   ALT 9 09/06/2022 1011   ALKPHOS 166 (H) 09/06/2022 1011   BILITOT 0.4 09/06/2022 1011   GFRNONAA 54 (L) 09/06/2022 1011   GFRNONAA >89 07/13/2014 1621   GFRAA 86 11/09/2020 1156   GFRAA >89 07/13/2014 1621    No results found for: "SPEP", "UPEP"  Lab Results  Component Value Date   WBC 7.6 09/06/2022   NEUTROABS 5.0 09/06/2022   HGB 9.9 (L) 09/06/2022   HCT 30.7 (L) 09/06/2022   MCV 90.8 09/06/2022   PLT 292 09/06/2022      Chemistry      Component Value Date/Time   NA 141 09/06/2022 1011   NA 139 01/10/2022 1644   K 3.6 09/06/2022 1011   CL 105 09/06/2022 1011   CO2 29 09/06/2022 1011   BUN 21 09/06/2022 1011   BUN 13 01/10/2022 1644   CREATININE 1.11 (H) 09/06/2022 1011   CREATININE 0.64 07/13/2014 1621      Component Value Date/Time   CALCIUM 9.6 09/06/2022 1011   CALCIUM 9.7 03/29/2010 1554   ALKPHOS 166 (H) 09/06/2022 1011   AST 10 (L) 09/06/2022 1011   ALT 9 09/06/2022 1011   BILITOT 0.4 09/06/2022 1011

## 2022-09-06 NOTE — Assessment & Plan Note (Signed)
This is improving She is not symptomatic Observe only

## 2022-09-06 NOTE — Assessment & Plan Note (Signed)
This has improved I recommend the patient to continue taking oral magnesium

## 2022-09-07 LAB — CA 125: Cancer Antigen (CA) 125: 10.1 U/mL (ref 0.0–38.1)

## 2022-09-09 ENCOUNTER — Telehealth: Payer: Self-pay | Admitting: Hematology and Oncology

## 2022-09-09 NOTE — Telephone Encounter (Signed)
Scheduled appointment per 11/10 los. Unable to leace a voicemail due to the patients mailbox not being set up yet.

## 2022-09-13 ENCOUNTER — Other Ambulatory Visit: Payer: Self-pay | Admitting: Hematology and Oncology

## 2022-09-13 DIAGNOSIS — C55 Malignant neoplasm of uterus, part unspecified: Secondary | ICD-10-CM

## 2022-09-16 ENCOUNTER — Telehealth: Payer: Self-pay

## 2022-09-16 NOTE — Patient Outreach (Signed)
  Care Coordination   Follow Up Visit Note   09/16/2022 Name: Sherri Gill MRN: [id number] DOB: 01-28-1951  Sherri Gill is a 71 y.o. year old female who sees [name], Elaina Pattee, MD for primary care. I spoke with  [name] by phone today.  What matters to the patients health and wellness today?  Patient was concerned about her medications and obtaining refills.    Goals Addressed               This Visit's Progress     COMPLETED: "I had to call the hospital because I did not get refills on my medications" (pt-stated)        Care Coordination Interventions: Reviewed medications with patient and discussed each medication and refill availability on them. Assessed social determinant of health barriers          SDOH assessments and interventions completed:  Yes  SDOH Interventions Today    Flowsheet Row Most Recent Value  SDOH Interventions   Housing Interventions Intervention Not Indicated  Utilities Interventions Intervention Not Indicated        Care Coordination Interventions Activated:  Yes  Care Coordination Interventions:  Yes, provided   Follow up plan: No further intervention required.   Encounter Outcome:  Pt. Visit Completed

## 2022-09-30 ENCOUNTER — Telehealth: Payer: Self-pay

## 2022-09-30 ENCOUNTER — Other Ambulatory Visit: Payer: Self-pay | Admitting: Physician Assistant

## 2022-09-30 ENCOUNTER — Encounter: Payer: Self-pay | Admitting: Internal Medicine

## 2022-09-30 NOTE — Patient Outreach (Signed)
  Care Coordination   Follow Up Visit Note   09/30/2022 Name: Sherri Gill MRN: [id number] DOB: 23-Apr-1951  Sherri Gill is a 71 y.o. year old female who sees [name], Elaina Pattee, MD for primary care. I spoke with  [name] by phone today.  What matters to the patients health and wellness today?  "My Aunt died and I am in [employer]"    Goals Addressed               This Visit's Progress     COMPLETED: "I am in [place] and need my blood pressure medication" (pt-stated)        Care Coordination Interventions: Reviewed medications with patient and discussed her concern of running out of her medications, specifically Hydrazaline '25mg'$ . Patient discovered she did not need a refill on her Hydrazaline as she had refilled in November and found the bottle.          SDOH assessments and interventions completed:  Yes  SDOH Interventions Today    Flowsheet Row Most Recent Value  SDOH Interventions   Food Insecurity Interventions Intervention Not Indicated  Housing Interventions Intervention Not Indicated  Transportation Interventions Intervention Not Indicated        Care Coordination Interventions:  Yes, provided   Follow up plan:  Patient to call as needed    Encounter Outcome:  Pt. Visit Completed

## 2022-10-10 ENCOUNTER — Encounter: Payer: Medicare HMO | Admitting: Student

## 2022-10-10 DIAGNOSIS — E559 Vitamin D deficiency, unspecified: Secondary | ICD-10-CM | POA: Diagnosis not present

## 2022-10-30 ENCOUNTER — Other Ambulatory Visit: Payer: Self-pay

## 2022-10-30 ENCOUNTER — Telehealth: Payer: Self-pay

## 2022-10-30 DIAGNOSIS — C55 Malignant neoplasm of uterus, part unspecified: Secondary | ICD-10-CM

## 2022-10-30 MED ORDER — PANTOPRAZOLE SODIUM 40 MG PO TBEC: 40.0000 mg | DELAYED_RELEASE_TABLET | Freq: Every day | ORAL | 0 refills | Status: DC

## 2022-10-30 NOTE — Telephone Encounter (Signed)
Attempted to call back. She left a message regarding upcoming CT.

## 2022-10-30 NOTE — Telephone Encounter (Signed)
Called her back. She is out of town for a funeral and needs Protonix Rx sent to [facility] in [place], [place]. Rx sent.

## 2022-11-01 ENCOUNTER — Ambulatory Visit (HOSPITAL_COMMUNITY): Payer: Medicare HMO

## 2022-11-01 ENCOUNTER — Other Ambulatory Visit: Payer: Medicare HMO

## 2022-11-04 ENCOUNTER — Telehealth: Payer: Self-pay

## 2022-11-04 NOTE — Telephone Encounter (Signed)
Called back and given below message from Dr. Alvy Bimler. She will try tum's. She will call move CT and call the office back with date. Then the office can schedule port lab flush and MD appt.

## 2022-11-04 NOTE — Telephone Encounter (Signed)
Returned her call. She is out of town in [place] until 1/22 due to multiple deaths in her family. She rescheduled 1/5 CT, to 1/12 and then to 1/19. She is going to call and move the scheduled CT to the week on 1/22. Then the office can move her lab and MD appt to around CT appt.  She did not eat anything on Saturday and then ate a lot of pecans on a empty stomach. She then had abdominal pain/ cramping and belching. She thinks it is her diverticulosis. Her symptoms are better now. Denies fever and constipation. She is eating correctly now. She is still having some abdominal bloating and taking Protonix. She is avoiding any nuts. She is asking if Dr. Alvy Bimler has any suggestions.

## 2022-11-04 NOTE — Telephone Encounter (Signed)
I do not have suggestions regarding her stomach issues She can take some tums for bloating

## 2022-11-05 ENCOUNTER — Ambulatory Visit: Payer: Medicare HMO | Admitting: Hematology and Oncology

## 2022-11-07 ENCOUNTER — Other Ambulatory Visit: Payer: Self-pay | Admitting: Student

## 2022-11-07 ENCOUNTER — Encounter: Payer: Self-pay | Admitting: Hematology and Oncology

## 2022-11-07 ENCOUNTER — Telehealth: Payer: Self-pay

## 2022-11-07 MED ORDER — LOSARTAN POTASSIUM 100 MG PO TABS: 100.0000 mg | ORAL_TABLET | Freq: Every day | ORAL | 0 refills | Status: DC

## 2022-11-07 MED ORDER — HYDRALAZINE HCL 25 MG PO TABS: 25.0000 mg | ORAL_TABLET | Freq: Three times a day (TID) | ORAL | 1 refills | Status: DC

## 2022-11-07 NOTE — Patient Outreach (Signed)
  Care Coordination   Follow Up Visit Note   11/07/2022 Name: Sherri Gill MRN: [id number] DOB: 05-29-1951  Sherri Gill is a 72 y.o. year old female who sees [name], Elaina Pattee, MD for primary care. I spoke with  Sherri Gill by phone today.  What matters to the patients health and wellness today?  Patient needs mediction refills.    Goals Addressed               This Visit's Progress     COMPLETED: I need a refill of my Cozaar and Apresoline (pt-stated)        Care Coordination Interventions: Evaluation of current treatment plan related to her need for refills of Cozaar and Apresoline and patient's adherence to plan as established by provider Collaborated with Dr. [name] and Dr. Agustin Cree regarding obtaining refills. Patient is currently in [place] for multiple funerals and wants the medications called into the [facility] in [place], [place].         SDOH assessments and interventions completed:  Yes  SDOH Interventions Today    Flowsheet Row Most Recent Value  SDOH Interventions   Housing Interventions Intervention Not Indicated  Transportation Interventions Intervention Not Indicated        Care Coordination Interventions:  Yes, provided   Follow up plan:  Call patient back when I hear from providers    Encounter Outcome:  Pt. Visit Completed

## 2022-11-07 NOTE — Telephone Encounter (Signed)
Repeat note

## 2022-11-08 ENCOUNTER — Telehealth: Payer: Self-pay

## 2022-11-08 ENCOUNTER — Other Ambulatory Visit: Payer: Medicare HMO

## 2022-11-08 ENCOUNTER — Ambulatory Visit (HOSPITAL_COMMUNITY): Payer: Medicare HMO

## 2022-11-08 NOTE — Patient Outreach (Signed)
  Care Coordination   Follow Up Visit Note   11/08/2022 Name: Sherri Gill MRN: [id number] DOB: 22-Apr-1951  Sherri Gill is a 72 y.o. year old female who sees [name], Elaina Pattee, MD for primary care. I spoke with  Sherri Gill by phone today.  What matters to the patients health and wellness today?  Obtaining hypertension medications    Goals Addressed               This Visit's Progress     COMPLETED: "I am in [place] and need my blood pressure medication" (pt-stated)        Care Coordination Interventions: Reviewed medications with patient and discussed her concern of running out of her medications, specifically Hydrazaline '25mg'$ . Patient discovered she did not need a refill on her Hydrazaline as she had refilled in November and found the bottle. Call placed to patient to notify her medications were called into the requested [facility].           SDOH assessments and interventions completed:  Yes  SDOH Interventions Today    Flowsheet Row Most Recent Value  SDOH Interventions   Food Insecurity Interventions Intervention Not Indicated        Care Coordination Interventions:  Yes, provided   Follow up plan: No further intervention required.   Encounter Outcome:  Pt. Visit Completed

## 2022-11-12 ENCOUNTER — Ambulatory Visit: Payer: Medicare HMO | Admitting: Hematology and Oncology

## 2022-11-15 ENCOUNTER — Ambulatory Visit (HOSPITAL_COMMUNITY): Payer: Medicare HMO

## 2022-11-21 ENCOUNTER — Telehealth: Payer: Self-pay

## 2022-11-21 ENCOUNTER — Encounter: Payer: Medicare HMO | Admitting: Internal Medicine

## 2022-11-21 NOTE — Telephone Encounter (Signed)
Returned her call. She is going to call and try to move CT appt for tomorrow. She will call the office back.

## 2022-11-21 NOTE — Telephone Encounter (Signed)
Called back and reviewed appts for next week. She verbalized understanding appreciated the call.

## 2022-11-22 ENCOUNTER — Ambulatory Visit (HOSPITAL_COMMUNITY): Payer: Medicare HMO

## 2022-11-25 ENCOUNTER — Inpatient Hospital Stay: Payer: Medicare HMO

## 2022-11-25 ENCOUNTER — Ambulatory Visit (HOSPITAL_COMMUNITY): Payer: Medicare HMO

## 2022-11-26 ENCOUNTER — Telehealth: Payer: Self-pay

## 2022-11-26 NOTE — Telephone Encounter (Signed)
Pls cancel her appt on Friday and try to call her again next 2 days to reschedule

## 2022-11-26 NOTE — Telephone Encounter (Signed)
Received a call from imaging that she canceled CT yesterday  1/29 due to being sick. Attempted to call [facility] and her voicemail is not set up. Unable to leave a message.  Just FYI

## 2022-11-27 ENCOUNTER — Encounter: Payer: Self-pay | Admitting: Hematology and Oncology

## 2022-11-27 NOTE — Telephone Encounter (Signed)
Called back and told her appt with Dr. Alvy Bimler canceled for tomorrow. She will call to schedule CT when she feels better and then call the office for appt with Dr. Alvy Bimler. Given radiology scheduling #.

## 2022-11-29 ENCOUNTER — Ambulatory Visit: Payer: Medicare HMO | Admitting: Hematology and Oncology

## 2022-12-25 ENCOUNTER — Other Ambulatory Visit: Payer: Self-pay

## 2022-12-25 NOTE — Progress Notes (Signed)
Patient outreached by Angus Seller, PharmD Candidate on 12/25/22 to discuss hypertension.   Patient has an automated home blood pressure machine, but they have been away from home for a while so haven't checked it recently. They report home readings 136/80. Reports BP has been much better since they changed her meds.   Medication review was performed. They are not taking medications as prescribed. Differences from their prescribed list include: Taking hydralazine BID instead of TID because BP gets too low with TID.   The following barriers to adherence were noted:  - They do not have cost concerns.  - They do not have transportation concerns.  - They do not need assistance obtaining refills. - They do not occasionally forget to take some of their prescribed medications.  - They do not feel like one/some of their medications make them feel poorly. Switch from amlodipine to diltiazem has helped her HR and BP a lot.  - They do not have questions or concerns about their medications.  - They do have follow up scheduled with their primary care provider/cardiologist.   The following interventions were completed:  - Medications were reviewed  - Patient was educated on goal blood pressures and long term health implications of elevated blood pressure  - Patient was educated on medications, including indication and administration  - Patient was counseled on lifestyle modifications to improve blood pressure, including diet and exercise. She walks 4 days/week, but has not been able to lately because of the cold weather. She has been careful with diet since her diverticulitis episode.   The patient has follow up scheduled: 01/23/2023  PCP: Dr. Reed Breech, PharmD Candidate  [employer], [employer] of 2024   Joseph Art, [place].D. PGY-2 Ambulatory Care Pharmacy Resident

## 2023-01-03 ENCOUNTER — Other Ambulatory Visit: Payer: Self-pay | Admitting: Student

## 2023-01-03 ENCOUNTER — Telehealth: Payer: Self-pay

## 2023-01-03 NOTE — Telephone Encounter (Signed)
-----   Message from [name], MD sent at 01/03/2023 10:46 AM EST ----- Looks like she never scheduled CT Can you ask if she wants future follow?

## 2023-01-03 NOTE — Telephone Encounter (Signed)
Called and unable to leave a message due to voice mail.

## 2023-01-06 ENCOUNTER — Encounter: Payer: Self-pay | Admitting: Hematology and Oncology

## 2023-01-23 ENCOUNTER — Encounter: Payer: Medicare HMO | Admitting: Internal Medicine

## 2023-01-27 ENCOUNTER — Telehealth: Payer: Self-pay | Admitting: Hematology and Oncology

## 2023-01-27 ENCOUNTER — Telehealth: Payer: Self-pay

## 2023-01-27 NOTE — Telephone Encounter (Signed)
Patient called to reschedule missed appointments. Patient rescheduled and notified. Forwarding concerns to RN.

## 2023-01-27 NOTE — Telephone Encounter (Signed)
Returned her call. She scheduled CT on 4/13 and appts with Dr. Alvy Bimler on 4/16. She will try to get appt for earlier CT.  She is leaving [employer] on 4/3 and heading home. She has staying in [place] since arriving late last year. She has had multiple family members pass.  She is complaining of upper abdominal pain and currently taking Protonix. She has 1 pill left. She is having a hard time eating and lost maybe 8 lbs. She is having a lot of belching and gas. She is having some depression with all passing of family members. Instructed to go to urgent care in [place] to be evaluated today and call PCP office and make appt for when she gets home. She is agreeable and verbalized understanding.  FYI

## 2023-01-28 ENCOUNTER — Other Ambulatory Visit: Payer: Self-pay

## 2023-01-28 ENCOUNTER — Telehealth: Payer: Self-pay

## 2023-01-28 ENCOUNTER — Encounter: Payer: Self-pay | Admitting: Hematology and Oncology

## 2023-01-28 DIAGNOSIS — R197 Diarrhea, unspecified: Secondary | ICD-10-CM | POA: Diagnosis not present

## 2023-01-28 DIAGNOSIS — C801 Malignant (primary) neoplasm, unspecified: Secondary | ICD-10-CM | POA: Diagnosis not present

## 2023-01-28 DIAGNOSIS — K219 Gastro-esophageal reflux disease without esophagitis: Secondary | ICD-10-CM | POA: Diagnosis not present

## 2023-01-28 DIAGNOSIS — N132 Hydronephrosis with renal and ureteral calculous obstruction: Secondary | ICD-10-CM | POA: Diagnosis not present

## 2023-01-28 DIAGNOSIS — R591 Generalized enlarged lymph nodes: Secondary | ICD-10-CM | POA: Diagnosis not present

## 2023-01-28 DIAGNOSIS — R1013 Epigastric pain: Secondary | ICD-10-CM | POA: Diagnosis not present

## 2023-01-28 DIAGNOSIS — C786 Secondary malignant neoplasm of retroperitoneum and peritoneum: Secondary | ICD-10-CM | POA: Diagnosis not present

## 2023-01-28 DIAGNOSIS — R1900 Intra-abdominal and pelvic swelling, mass and lump, unspecified site: Secondary | ICD-10-CM | POA: Diagnosis not present

## 2023-01-28 DIAGNOSIS — E119 Type 2 diabetes mellitus without complications: Secondary | ICD-10-CM | POA: Diagnosis not present

## 2023-01-28 DIAGNOSIS — R112 Nausea with vomiting, unspecified: Secondary | ICD-10-CM | POA: Diagnosis not present

## 2023-01-28 NOTE — Telephone Encounter (Signed)
Returned her call. She went to [facility] yesterday and was admitted. She had scan and her scan showed that her cancer has reoccurred.  She is being discharge now and will be coming back to [place]. She is asking for a earlier appt and a call back. She has her medical record from [place] and a disc copy of imaging.

## 2023-01-29 ENCOUNTER — Encounter: Payer: Self-pay | Admitting: Hematology and Oncology

## 2023-01-29 ENCOUNTER — Inpatient Hospital Stay
Admission: RE | Admit: 2023-01-29 | Discharge: 2023-01-29 | Disposition: A | Discharge status: Home / Self Care | Payer: Self-pay | Source: Ambulatory Visit | Attending: Hematology and Oncology | Admitting: Hematology and Oncology

## 2023-01-29 ENCOUNTER — Encounter: Payer: Self-pay | Admitting: Oncology

## 2023-01-29 DIAGNOSIS — C55 Malignant neoplasm of uterus, part unspecified: Secondary | ICD-10-CM

## 2023-01-29 NOTE — Telephone Encounter (Signed)
[facility] and scheduled appointment to see Dr. Alvy Bimler on 01/31/23 at 3:00.  She verbalized understanding and agreement.

## 2023-01-29 NOTE — Telephone Encounter (Signed)
Sherri Gill,  I can see her at 3 pm on Friday, 45 mins We need images downloaded

## 2023-01-29 NOTE — Progress Notes (Signed)
Requested powershare of CT chest, abdomen, pelvis done at [facility] with [facility] in Radiology.

## 2023-01-30 NOTE — Telephone Encounter (Signed)
Returned her call. She is asking what she should take for pain. Instructed to take tylenol every 6 hours. She is already taking tylenol. Reminded of appt tomorrow with Dr. Alvy Bimler. She verbalized understanding.

## 2023-01-31 ENCOUNTER — Encounter: Payer: Self-pay | Admitting: Hematology and Oncology

## 2023-01-31 ENCOUNTER — Inpatient Hospital Stay: Payer: Medicare HMO | Attending: Hematology and Oncology | Admitting: Hematology and Oncology

## 2023-01-31 ENCOUNTER — Inpatient Hospital Stay: Payer: Medicare HMO

## 2023-01-31 ENCOUNTER — Other Ambulatory Visit (HOSPITAL_COMMUNITY): Payer: Self-pay

## 2023-01-31 VITALS — BP 157/61 | HR 97 | Temp 99.0°F | Resp 18 | Ht 63.0 in | Wt 182.4 lb

## 2023-01-31 DIAGNOSIS — Z5112 Encounter for antineoplastic immunotherapy: Secondary | ICD-10-CM | POA: Insufficient documentation

## 2023-01-31 DIAGNOSIS — Z79630 Long term (current) use of alkylating agent: Secondary | ICD-10-CM | POA: Diagnosis not present

## 2023-01-31 DIAGNOSIS — K30 Functional dyspepsia: Secondary | ICD-10-CM | POA: Diagnosis not present

## 2023-01-31 DIAGNOSIS — K219 Gastro-esophageal reflux disease without esophagitis: Secondary | ICD-10-CM | POA: Insufficient documentation

## 2023-01-31 DIAGNOSIS — Z79633 Long term (current) use of mitotic inhibitor: Secondary | ICD-10-CM | POA: Insufficient documentation

## 2023-01-31 DIAGNOSIS — Z888 Allergy status to other drugs, medicaments and biological substances status: Secondary | ICD-10-CM | POA: Insufficient documentation

## 2023-01-31 DIAGNOSIS — K5730 Diverticulosis of large intestine without perforation or abscess without bleeding: Secondary | ICD-10-CM | POA: Insufficient documentation

## 2023-01-31 DIAGNOSIS — R140 Abdominal distension (gaseous): Secondary | ICD-10-CM | POA: Diagnosis not present

## 2023-01-31 DIAGNOSIS — Z79620 Long term (current) use of immunosuppressive biologic: Secondary | ICD-10-CM | POA: Insufficient documentation

## 2023-01-31 DIAGNOSIS — C55 Malignant neoplasm of uterus, part unspecified: Secondary | ICD-10-CM

## 2023-01-31 DIAGNOSIS — Z7901 Long term (current) use of anticoagulants: Secondary | ICD-10-CM | POA: Diagnosis not present

## 2023-01-31 DIAGNOSIS — Z79899 Other long term (current) drug therapy: Secondary | ICD-10-CM | POA: Insufficient documentation

## 2023-01-31 DIAGNOSIS — Z5111 Encounter for antineoplastic chemotherapy: Secondary | ICD-10-CM | POA: Diagnosis present

## 2023-01-31 DIAGNOSIS — Z885 Allergy status to narcotic agent status: Secondary | ICD-10-CM | POA: Diagnosis not present

## 2023-01-31 DIAGNOSIS — Z880 Allergy status to penicillin: Secondary | ICD-10-CM | POA: Diagnosis not present

## 2023-01-31 LAB — CBC WITH DIFFERENTIAL (CANCER CENTER ONLY)
Abs Immature Granulocytes: 0.03 10*3/uL (ref 0.00–0.07)
Basophils Absolute: 0 10*3/uL (ref 0.0–0.1)
Basophils Relative: 0 %
Eosinophils Absolute: 0.1 10*3/uL (ref 0.0–0.5)
Eosinophils Relative: 1 %
HCT: 30.8 % — ABNORMAL LOW (ref 36.0–46.0)
Hemoglobin: 9.9 g/dL — ABNORMAL LOW (ref 12.0–15.0)
Immature Granulocytes: 0 %
Lymphocytes Relative: 21 %
Lymphs Abs: 2 10*3/uL (ref 0.7–4.0)
MCH: 26.1 pg (ref 26.0–34.0)
MCHC: 32.1 g/dL (ref 30.0–36.0)
MCV: 81.1 fL (ref 80.0–100.0)
Monocytes Absolute: 0.9 10*3/uL (ref 0.1–1.0)
Monocytes Relative: 9 %
Neutro Abs: 6.4 10*3/uL (ref 1.7–7.7)
Neutrophils Relative %: 69 %
Platelet Count: 361 10*3/uL (ref 150–400)
RBC: 3.8 MIL/uL — ABNORMAL LOW (ref 3.87–5.11)
RDW: 15.8 % — ABNORMAL HIGH (ref 11.5–15.5)
WBC Count: 9.4 10*3/uL (ref 4.0–10.5)
nRBC: 0 % (ref 0.0–0.2)

## 2023-01-31 LAB — CMP (CANCER CENTER ONLY)
ALT: 8 U/L (ref 0–44)
AST: 11 U/L — ABNORMAL LOW (ref 15–41)
Albumin: 4.2 g/dL (ref 3.5–5.0)
Alkaline Phosphatase: 123 U/L (ref 38–126)
Anion gap: 9 (ref 5–15)
BUN: 21 mg/dL (ref 8–23)
CO2: 27 mmol/L (ref 22–32)
Calcium: 9.7 mg/dL (ref 8.9–10.3)
Chloride: 102 mmol/L (ref 98–111)
Creatinine: 1.43 mg/dL — ABNORMAL HIGH (ref 0.44–1.00)
GFR, Estimated: 40 mL/min — ABNORMAL LOW (ref >60)
Glucose, Bld: 122 mg/dL — ABNORMAL HIGH (ref 70–99)
Potassium: 3.4 mmol/L — ABNORMAL LOW (ref 3.5–5.1)
Sodium: 138 mmol/L (ref 135–145)
Total Bilirubin: 0.4 mg/dL (ref 0.3–1.2)
Total Protein: 7.4 g/dL (ref 6.5–8.1)

## 2023-01-31 LAB — MAGNESIUM: Magnesium: 1.7 mg/dL (ref 1.7–2.4)

## 2023-01-31 LAB — TSH: TSH: 6.407 u[IU]/mL — ABNORMAL HIGH (ref 0.350–4.500)

## 2023-01-31 MED ORDER — PROCHLORPERAZINE MALEATE 10 MG PO TABS
10.0000 mg | ORAL_TABLET | Freq: Four times a day (QID) | ORAL | 1 refills | Status: DC | PRN
Start: 2023-01-31 — End: 2023-08-27
  Filled 2023-01-31: qty 30, 8d supply, fill #0
  Filled 2023-07-07: qty 30, 8d supply, fill #1

## 2023-01-31 MED ORDER — DEXAMETHASONE 4 MG PO TABS
ORAL_TABLET | ORAL | 6 refills | Status: DC
  Filled 2023-01-31: qty 16, 84d supply, fill #0

## 2023-01-31 MED ORDER — ONDANSETRON HCL 8 MG PO TABS
8.0000 mg | ORAL_TABLET | Freq: Three times a day (TID) | ORAL | 1 refills | Status: DC | PRN
Start: 2023-01-31 — End: 2023-08-27
  Filled 2023-01-31: qty 30, 10d supply, fill #0
  Filled 2023-07-05 – 2023-07-07 (×2): qty 30, 10d supply, fill #1

## 2023-01-31 MED ORDER — LIDOCAINE-PRILOCAINE 2.5-2.5 % EX CREA
TOPICAL_CREAM | CUTANEOUS | 3 refills | Status: DC
Start: 2023-01-31 — End: 2023-08-27
  Filled 2023-01-31: qty 30, 30d supply, fill #0

## 2023-01-31 NOTE — Progress Notes (Signed)
ON PATHWAY REGIMEN - Uterine  No Change  Continue With Treatment as Ordered.  Original Decision Date/Time: 01/15/2022 08:52     A cycle is every 21 days:     Paclitaxel      Carboplatin   **Always confirm dose/schedule in your pharmacy ordering system**  Patient Characteristics: Serous Carcinoma, Newly Diagnosed (Clinical Staging), Nonsurgical Candidate, Stage III or IV, HER2 Negative/Unknown Histology: Serous Carcinoma Therapeutic Status: Newly Diagnosed (Clinical Staging) AJCC M Category: cM0 Surgical Candidacy: Nonsurgical Candidate AJCC 8 Stage Grouping: III AJCC T Category: cT3 AJCC N Category: cN0 HER2 Status: Awaiting Test Results  Intent of Therapy: Curative Intent, Discussed with Patient

## 2023-02-01 ENCOUNTER — Other Ambulatory Visit: Payer: Self-pay

## 2023-02-01 LAB — T4, FREE: Free T4: 1.05 ng/dL (ref 0.61–1.12)

## 2023-02-02 ENCOUNTER — Encounter: Payer: Self-pay | Admitting: Hematology and Oncology

## 2023-02-02 ENCOUNTER — Other Ambulatory Visit: Payer: Self-pay

## 2023-02-02 DIAGNOSIS — R140 Abdominal distension (gaseous): Secondary | ICD-10-CM | POA: Insufficient documentation

## 2023-02-02 LAB — CA 125: Cancer Antigen (CA) 125: 429 U/mL — ABNORMAL HIGH (ref 0.0–38.1)

## 2023-02-02 NOTE — Assessment & Plan Note (Signed)
We reviewed her outside imaging studies Unfortunately, she has cancer relapse  We reviewed the NCCN guidelines I recommend treatment based on publication as follows:  Dostarlimab for Primary Advanced or Recurrent Endometrial Cancer  [place] Maryclare Bean, M.D., Cherly [place]. Almeta Monas, M.D., Orion Crook. Slom[place], M.D., Ren Doreene Adas, Ph.D., Sherri Rad, Ph.D., Doristine Locks, M.D., Lattie Haw, M.D., Aleda Grana, M.D., Sharlene Motts, M.D., Rosezella [place]. Buelah Manis, M.D., Ph.D., Clarise Cruz. Tora Kindred, M.D., Genia Plants, M.D., et al., for the [employer]*  Malva Limes Med [id number] DOI: 10.1056/[id number]  BACKGROUND Dostarlimab is an immune-checkpoint inhibitor that targets the programmed cell death 1 receptor. The combination of chemotherapy and immunotherapy may have synergistic effects in the treatment of endometrial cancer.  METHODS We conducted a phase 3, global, double-blind, randomized, placebo-controlled trial. Eligible patients with primary advanced stage III or IV or first recurrent endometrial cancer were randomly assigned in a 1:1 ratio to receive either dostarlimab (500 mg) or placebo, plus carboplatin (area under the concentration-time curve, 5 mg per milliliter per minute) and paclitaxel (175 mg per square meter of body-surface area), every 3 weeks (six cycles), followed by dostarlimab (1000 mg) or placebo every 6 weeks for up to 3 years. The primary end points were progression-free survival as assessed by the investigator according to Response Evaluation Criteria in Solid Tumors (RECIST), version 1.1, and overall survival. Safety was also assessed.  RESULTS Of the 494 patients who underwent randomization, 118 (23.9%) had mismatch repair-deficient (dMMR), microsatellite instability-high (MSI-H) tumors. In the dMMR-MSI-H population, estimated progression-free survival at 24 months was 61.4% (95% confidence interval [CI], 46.3 to 73.4) in the dostarlimab group and 15.7% (95% CI,  7.2 to 27.0) in the placebo group (hazard ratio for progression or death, 0.28; 95% CI, 0.16 to 0.50; P<0.001). In the overall population, progression-free survival at 24 months was 36.1% (95% CI, 29.3 to 42.9) in the dostarlimab group and 18.1% (95% CI, 13.0 to 23.9) in the placebo group (hazard ratio, 0.64; 95% CI, 0.51 to 0.80; P<0.001). Overall survival at 24 months was 71.3% (95% CI, 64.5 to 77.1) with dostarlimab and 56.0% (95% CI, 48.9 to 62.5) with placebo (hazard ratio for death, 0.64; 95% CI, 0.46 to 0.87). The most common adverse events that occurred or worsened during treatment were nausea (53.9% of the patients in the dostarlimab group and 45.9% of those in the placebo group), alopecia (53.5% and 50.0%), and fatigue (51.9% and 54.5%). Severe and serious adverse events were more frequent in the dostarlimab group than in the placebo group.  CONCLUSIONS Dostarlimab plus carboplatin-paclitaxel significantly increased progression-free survival among patients with primary advanced or recurrent endometrial cancer, with a substantial benefit in the dMMR-MSI-H population. (Funded by [employer]; [employer].gov number, [id number])  The risks, benefits, side effects of treatment is discussed with the patient and she agreed to proceed with plan of care.  We discussed premed dex I recommend minimum 3 cycles before repeating imaging

## 2023-02-02 NOTE — Assessment & Plan Note (Signed)
Due to her disease I recommend frequent small meals and regular laxatives

## 2023-02-02 NOTE — Progress Notes (Signed)
[facility] OFFICE PROGRESS NOTE  Patient Care Team: Miguel AschoffWilliams, Julie Anne, MD as PCP - General (Internal Medicine) Rennis GoldenHilty, Lisette AbuKenneth C, MD as PCP - Cardiology (Cardiology) Jodelle GrossBarnett, Deborah, RN as [facility] Care Management  ASSESSMENT & PLAN:  Uterine cancer [facility](HCC) We reviewed her outside imaging studies Unfortunately, she has cancer relapse  We reviewed the NCCN guidelines I recommend treatment based on publication as follows:  Dostarlimab for Primary Advanced or Recurrent Endometrial Cancer  [place] Maryclare Bean. Mirza, M.D., Cherly Beachana M. Almeta Monashase, M.D., Orion CrookBrian M. Slomovitz, M.D., Ren Doreene AdasdePont Christensen, Ph.D., Sherri RadZoltn Novk, Ph.D., Doristine Locksestin Black, M.D., Lattie HawLucy Gilbert, M.D., Aleda GranaSudarshan Sharma, M.D., Sharlene MottsGiorgio Valabrega, M.D., Rosezella FloridaLisa M. Buelah ManisLandrum, M.D., Ph.D., Clarise CruzLars C. Tora KindredHanker, M.D., Genia PlantsAshley Stuckey, M.D., et al., for the [employer]*  Malva Limes Engl J Med [id number]; 388:2145-2158 DOI: [id number]  BACKGROUND Dostarlimab is an immune-checkpoint inhibitor that targets the programmed cell death 1 receptor. The combination of chemotherapy and immunotherapy may have synergistic effects in the treatment of endometrial cancer.  METHODS We conducted a phase 3, global, double-blind, randomized, placebo-controlled trial. Eligible patients with primary advanced stage III or IV or first recurrent endometrial cancer were randomly assigned in a 1:1 ratio to receive either dostarlimab (500 mg) or placebo, plus carboplatin (area under the concentration-time curve, 5 mg per milliliter per minute) and paclitaxel (175 mg per square meter of body-surface area), every 3 weeks (six cycles), followed by dostarlimab (1000 mg) or placebo every 6 weeks for up to 3 years. The primary end points were progression-free survival as assessed by the investigator according to Response Evaluation Criteria in Solid Tumors (RECIST), version 1.1, and overall survival. Safety was also assessed.  RESULTS Of the 494 patients who  underwent randomization, 118 (23.9%) had mismatch repair-deficient (dMMR), microsatellite instability-high (MSI-H) tumors. In the dMMR-MSI-H population, estimated progression-free survival at 24 months was 61.4% (95% confidence interval [CI], 46.3 to 73.4) in the dostarlimab group and 15.7% (95% CI, 7.2 to 27.0) in the placebo group (hazard ratio for progression or death, 0.28; 95% CI, 0.16 to 0.50; P<0.001). In the overall population, progression-free survival at 24 months was 36.1% (95% CI, 29.3 to 42.9) in the dostarlimab group and 18.1% (95% CI, 13.0 to 23.9) in the placebo group (hazard ratio, 0.64; 95% CI, 0.51 to 0.80; P<0.001). Overall survival at 24 months was 71.3% (95% CI, 64.5 to 77.1) with dostarlimab and 56.0% (95% CI, 48.9 to 62.5) with placebo (hazard ratio for death, 0.64; 95% CI, 0.46 to 0.87). The most common adverse events that occurred or worsened during treatment were nausea (53.9% of the patients in the dostarlimab group and 45.9% of those in the placebo group), alopecia (53.5% and 50.0%), and fatigue (51.9% and 54.5%). Severe and serious adverse events were more frequent in the dostarlimab group than in the placebo group.  CONCLUSIONS Dostarlimab plus carboplatin-paclitaxel significantly increased progression-free survival among patients with primary advanced or recurrent endometrial cancer, with a substantial benefit in the dMMR-MSI-H population. (Funded by [employer]; [employer] ClinicalTrials.gov number, [id number])  The risks, benefits, side effects of treatment is discussed with the patient and she agreed to proceed with plan of care.  We discussed premed dex I recommend minimum 3 cycles before repeating imaging  Bloating symptom Due to her disease I recommend frequent small meals and regular laxatives  Orders Placed This Encounter  Procedures   CBC with Differential (Cancer Center Only)    Standing Status:   Future    Standing Expiration Date:   02/07/2024   CMP [facility](Cancer Center  only)    Standing Status:   Future    Standing Expiration Date:   02/07/2024   T4    Standing Status:   Future    Standing Expiration Date:   02/07/2024   TSH    Standing Status:   Future    Standing Expiration Date:   02/07/2024   CBC with Differential (Cancer Center Only)    Standing Status:   Future    Standing Expiration Date:   02/28/2024   CMP (Cancer Center only)    Standing Status:   Future    Standing Expiration Date:   02/28/2024   CBC with Differential (Cancer Center Only)    Standing Status:   Future    Standing Expiration Date:   03/20/2024   CMP (Cancer Center only)    Standing Status:   Future    Standing Expiration Date:   03/20/2024   T4    Standing Status:   Future    Standing Expiration Date:   03/20/2024   TSH    Standing Status:   Future    Standing Expiration Date:   03/20/2024   CBC with Differential (Cancer Center Only)    Standing Status:   Future    Standing Expiration Date:   04/10/2024   CMP (Cancer Center only)    Standing Status:   Future    Standing Expiration Date:   04/10/2024   CBC with Differential (Cancer Center Only)    Standing Status:   Future    Standing Expiration Date:   05/01/2024   CMP (Cancer Center only)    Standing Status:   Future    Standing Expiration Date:   05/01/2024   CBC with Differential (Cancer Center Only)    Standing Status:   Future    Standing Expiration Date:   05/22/2024   CMP (Cancer Center only)    Standing Status:   Future    Standing Expiration Date:   05/22/2024   T4    Standing Status:   Future    Standing Expiration Date:   05/22/2024   TSH    Standing Status:   Future    Standing Expiration Date:   05/22/2024   CBC with Differential (Cancer Center Only)    Standing Status:   Future    Standing Expiration Date:   06/12/2024   CMP (Cancer Center only)    Standing Status:   Future    Standing Expiration Date:   06/12/2024   T4    Standing Status:   Future    Standing Expiration Date:   06/12/2024   TSH     Standing Status:   Future    Standing Expiration Date:   06/12/2024   CBC with Differential (Cancer Center Only)    Standing Status:   Future    Standing Expiration Date:   07/24/2024   CMP (Cancer Center only)    Standing Status:   Future    Standing Expiration Date:   07/24/2024   T4    Standing Status:   Future    Standing Expiration Date:   07/24/2024   TSH    Standing Status:   Future    Standing Expiration Date:   07/24/2024   CBC with Differential (Cancer Center Only)    Standing Status:   Future    Standing Expiration Date:   09/04/2024   CMP (Cancer Center only)    Standing Status:   Future    Standing Expiration Date:  09/04/2024   T4    Standing Status:   Future    Standing Expiration Date:   09/04/2024   TSH    Standing Status:   Future    Standing Expiration Date:   09/04/2024   CBC with Differential (Cancer Center Only)    Standing Status:   Future    Standing Expiration Date:   10/16/2024   CMP (Cancer Center only)    Standing Status:   Future    Standing Expiration Date:   10/16/2024   T4    Standing Status:   Future    Standing Expiration Date:   10/16/2024   TSH    Standing Status:   Future    Standing Expiration Date:   10/16/2024   CBC with Differential (Cancer Center Only)    Standing Status:   Future    Standing Expiration Date:   11/27/2024   CMP (Cancer Center only)    Standing Status:   Future    Standing Expiration Date:   11/27/2024   T4    Standing Status:   Future    Standing Expiration Date:   11/27/2024   TSH    Standing Status:   Future    Standing Expiration Date:   11/27/2024   CBC with Differential (Cancer Center Only)    Standing Status:   Future    Standing Expiration Date:   01/08/2025   CMP (Cancer Center only)    Standing Status:   Future    Standing Expiration Date:   01/08/2025   T4    Standing Status:   Future    Standing Expiration Date:   01/08/2025   TSH    Standing Status:   Future    Standing Expiration Date:   01/08/2025    CBC with Differential (Cancer Center Only)    Standing Status:   Future    Number of Occurrences:   1    Standing Expiration Date:   01/31/2024   CMP (Cancer Center only)    Standing Status:   Future    Number of Occurrences:   1    Standing Expiration Date:   01/31/2024   TSH    Standing Status:   Future    Number of Occurrences:   1    Standing Expiration Date:   01/31/2024   T4, free    Standing Status:   Future    Number of Occurrences:   1    Standing Expiration Date:   01/31/2024    All questions were answered. The patient knows to call the clinic with any problems, questions or concerns. The total time spent in the appointment was 40 minutes encounter with patients including review of chart and various tests results, discussions about plan of care and coordination of care plan   Artis Delay, MD 02/02/2023 1:35 PM  INTERVAL HISTORY: Please see below for problem oriented charting. she returns for urgent eval She started to her sensation of GERD/gastritis and indigestion in January She has frequent belching, sensation of bloating and poor appetite  REVIEW OF SYSTEMS:   Constitutional: Denies fevers, chills or abnormal weight loss Eyes: Denies blurriness of vision Ears, nose, mouth, throat, and face: Denies mucositis or sore throat Respiratory: Denies cough, dyspnea or wheezes Cardiovascular: Denies palpitation, chest discomfort or lower extremity swelling Skin: Denies abnormal skin rashes Lymphatics: Denies new lymphadenopathy or easy bruising Neurological:Denies numbness, tingling or new weaknesses Behavioral/Psych: Mood is stable, no new changes  All other systems  were reviewed with the patient and are negative.  I have reviewed the past medical history, past surgical history, social history and family history with the patient and they are unchanged from previous note.  ALLERGIES:  is allergic to codeine sulfate, penicillins, percocet [oxycodone-acetaminophen], and zestril  [lisinopril].  MEDICATIONS:  Current Outpatient Medications  Medication Sig Dispense Refill   dexamethasone (DECADRON) 4 MG tablet Take 2 tablets by mouth the night before and 2 tablets the morning of chemotherapy, every 3 weeks, for 6 cycles 36 tablet 6   acetaminophen (TYLENOL) 500 MG tablet Take 1,000 mg by mouth every 6 (six) hours as needed for mild pain.     albuterol (VENTOLIN HFA) 108 (90 Base) MCG/ACT inhaler Inhale 2 puffs into the lungs every 4 (four) hours as needed for wheezing or shortness of breath. 8 g 0   atorvastatin (LIPITOR) 20 MG tablet Take 1 tablet (20 mg total) by mouth at bedtime. 30 tablet 11   B Complex-C (B-COMPLEX WITH VITAMIN C) tablet Take 1 tablet by mouth in the morning.     Blood Glucose Monitoring Suppl (ONE TOUCH ULTRA MINI) w/Device KIT Please use as directed. 1 each 0   Cholecalciferol 1000 units capsule Take 1 capsule (1,000 Units total) by mouth daily. 90 capsule 1   diltiazem (CARDIZEM CD) 120 MG 24 hr capsule Take 1 capsule (120 mg total) by mouth daily. 30 capsule 6   dorzolamide-timolol (COSOPT) 22.3-6.8 MG/ML ophthalmic solution Place 1 drop into both eyes 2 (two) times daily.     glucose blood (ONE TOUCH ULTRA TEST) test strip Use as instructed 100 each 1   hydrALAZINE (APRESOLINE) 25 MG tablet TAKE 1 TABLET BY MOUTH THREE TIMES DAILY 90 tablet 3   Insulin Pen Needle 32G X 4 MM MISC 1 each by Does not apply route daily. 100 each 3   latanoprost (XALATAN) 0.005 % ophthalmic solution Place 1 drop into both eyes at bedtime.     lidocaine-prilocaine (EMLA) cream Apply to affected area once 30 g 3   lidocaine-prilocaine (EMLA) cream Apply to affected area once as directed 30 g 3   losartan (COZAAR) 100 MG tablet Take 1 tablet (100 mg total) by mouth at bedtime. 90 tablet 0   magnesium oxide (MAG-OX) 400 (240 Mg) MG tablet Take 1 tablet (400 mg total) by mouth daily. 60 tablet 1   metFORMIN (GLUCOPHAGE) 1000 MG tablet Take 1 tablet (1,000 mg total) by  mouth 2 (two) times daily. 180 tablet 3   ondansetron (ZOFRAN) 8 MG tablet Take 1 tablet (8 mg total) by mouth every 8 (eight) hours as needed for nausea or vomiting. Start on the third day after chemotherapy. 30 tablet 1   ONETOUCH DELICA LANCETS 33G MISC Please use as directed. 100 each 0   pantoprazole (PROTONIX) 40 MG tablet Take 1 tablet by mouth daily. 30 tablet 0   polyethylene glycol powder (GLYCOLAX/MIRALAX) 17 GM/SCOOP powder Take 17 g by mouth daily. 238 g 1   prochlorperazine (COMPAZINE) 10 MG tablet Take 1 tablet (10 mg total) by mouth every 6 (six) hours as needed for nausea or vomiting. 30 tablet 1   rivaroxaban (XARELTO) 20 MG TABS tablet Take 1 tablet (20 mg total) by mouth daily with supper. 90 tablet 1   No current facility-administered medications for this visit.    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 Heterogeneous endometrial thickening with appearance of moderate complex fluid in the endometrial canal, possible hemorrhagic material. Endometrial thickness is considered abnormal for an asymptomatic post-menopausal female. Endometrial sampling should be considered to exclude carcinoma. 2. Nonvisualized ovary 3. Heterogeneous thick-walled urinary bladder, correlate for cystitis.   12/28/2021 Pathology Results   CYTOLOGY - NON PAP  CASE: [id number]  PATIENT: Sherri Gill  Non-Gynecological Cytology Report   Clinical History: Abnormal pelvic US, highly suspicious of malignant ascites  Specimen Submitted:  A. ASCITES, PARACENTESIS:    FINAL MICROSCOPIC DIAGNOSIS:  - Malignant cells consistent with adenocarcinoma  - See comment   SPECIMEN ADEQUACY:  Satisfactory for evaluation   DIAGNOSTIC COMMENTS:  Immunohistochemical stains show that the tumor cells are positive for CK7 and PAX8 while they are negative for CK20, CDX2 and  ER, consistent with above interpretation.  Additionally, the tumor cells are diffusely positive for p53 (clonal overexpression), suggestive of a high-grade serous carcinoma.    12/28/2021 Imaging   US pelvis 1. Persistent abnormal appearance of the endometrium, heterogeneously thickened and complex with possible intraluminal fluid. This is similar to that seen on December 2022 ultrasound. Endometrial thickness is considered abnormal for an asymptomatic post-menopausal female. Endometrial sampling should be considered to exclude carcinoma. 2. Nonvisualization of the ovaries.   12/28/2021 Imaging   1. Large volume ascites. 2. Small umbilical hernia containing fat and ascites. 3. Haziness of anterior omentum most likely related to ascites, other etiologies such as metastatic disease can not be excluded. 4. Diffuse colonic diverticulosis without evidence for diverticulitis.   12/29/2021 Procedure   Successful ultrasound-guided paracentesis yielding 2.8 liters of peritoneal fluid.   01/14/2022 Initial Diagnosis   Uterine cancer (HCC)   01/14/2022 Cancer Staging   Staging form: Corpus Uteri - Carcinoma and Carcinosarcoma, AJCC 8th Edition - Clinical: Stage III (cT3, cN0, cM0) - Signed by Artis Delay, MD on 01/14/2022 Stage prefix: Initial diagnosis   01/15/2022 Surgery   Surgery: Cervical dilation under ultrasound guidance, hysteroscopy, endometrial sampling using the Myosure   Surgeons:  Carin Hock MD    Pathology: endometrial curetteings   Operative findings: On EUA, 8 cm mobile uterus, some nodularity along cul de sac. Cervix normal in appearance without discernible external os. Difficulty noted in dilating the cervix after using scalpel to incise in area of what what thought to be the external os. Under ultrasound guidance, cervix was dilated with confirmation on imaging of intra-uterine placement of dilators. On hysteroscopy, abnormal appearing endocervix and very calcified tissue obscuring good  visualization of endometrium concerning for replacement by tumor.    01/15/2022 Pathology Results   FINAL MICROSCOPIC DIAGNOSIS:   A.   ENDOMETRIUM, CURETTAGE:  -    Serous endometrial carcinoma.   COMMENT:   The carcinoma is strongly and diffusely positive for p53 and diffuse p16 positivity.  The ER shows relatively diffuse positivity.  Overall, the histologic features (slit-like glandular spaces and psammoma bodies) and the immunohistochemical phenotype are most characteristic for a serous carcinoma.    01/21/2022 Procedure   Placement of single lumen port a cath via right internal jugular vein. The catheter tip lies at the cavo-atrial junction. A power injectable port a cath was placed and is ready for immediate use.     01/25/2022 - 06/21/2022 Chemotherapy   Patient is on Treatment Plan : UTERINE Carboplatin AUC 6 / Paclitaxel q21d     03/21/2022 Imaging   1. Near complete resolution of previously noted ascites, with only small volume residual  perihepatic ascites. 2. Improved omental caking and stranding in the ventral abdomen. 3. Findings are consistent with treatment response of peritoneal metastatic disease. 4. Thickening of the urinary bladder wall , consistent with nonspecific infectious or inflammatory cystitis. Correlate with urinalysis.   Aortic Atherosclerosis (ICD10-I70.0).       03/22/2022 Tumor Marker   Patient's tumor was tested for the following markers: CA-125. Results of the tumor marker test revealed 21.5.   04/09/2022 Surgery     Surgery: Diagnostic laparoscopy, conversion to exploratory laparotomy, partial omentectomy, lysis of adhesions for approximately 60 minutes, total abdominal hysterectomy, bilateral salpingo-oophorectomy, excision of ileal tumor implant and excision of transverse colon epiploica tumor implant   04/09/2022 Pathology Results   A.   OMENTUM, RESECTION:  -    Metastatic serous carcinoma.   B.   ILEAL TUMOR IMPLANTS, EXCISION:  -     Adhesions with small focus of metastatic serous carcinoma, possibly  intralymphatic.   C.   UTERUS, CERVIX, BILATERAL FALLOPIAN TUBES AND OVARIES:  -    Serous carcinoma, endometrial, invasive into outer half of uterus,  with involvement of  cervical stroma, uterine serosa, ovaries and fallopian tubes.   -    Negative for cervical dysplasia.  -    Leiomyomas, largest 1.2 cm in greatest dimension.   D.   COLON, MESENTARY NODULE, TRANSVERSE, EXCISION:  -    Metastatic serous carcinoma   05/09/2022 Tumor Marker   Patient's tumor was tested for the following markers: CA-125. Results of the tumor marker test revealed 12.1.   05/29/2022 Tumor Marker   Patient's tumor was tested for the following markers: CA-125. Results of the tumor marker test revealed 9.1.   07/23/2022 Imaging   1. No abdominopelvic lymphadenopathy.  2. Scattered foci of fat stranding in the anterior pelvic peritoneal fat without discrete solid implants. Mild smooth wall thickening of the pelvic peritoneum. These findings are nonspecific and could represent postsurgical/post treatment change, although residual peritoneal carcinomatosis is on the differential and close follow-up CT is suggested. 3. Moderate left colonic diverticulosis. 4. Aortic Atherosclerosis (ICD10-I70.0).   07/23/2022 Tumor Marker   Patient's tumor was tested for the following markers: CA-125. Results of the tumor marker test revealed 5.2.   09/09/2022 Tumor Marker   Patient's tumor was tested for the following markers: CA-125. Results of the tumor marker test revealed 10.1.   02/07/2023 -  Chemotherapy   Patient is on Treatment Plan : UTERINE ENDOMETRIAL Dostarlimab-gxly (500 mg) + Carboplatin (AUC 5) + Paclitaxel (175 mg/m2) q21d x 6 cycles / Dostarlimab-gxly (1000 mg) q42d x 6 cycles        PHYSICAL EXAMINATION: ECOG PERFORMANCE STATUS: 1 - Symptomatic but completely ambulatory  Vitals:   01/31/23 1509  BP: (!) 157/61  Pulse: 97  Resp: 18   Temp: 99 F (37.2 C)  SpO2: 98%   Filed Weights   01/31/23 1509  Weight: 182 lb 6.4 oz (82.7 kg)    GENERAL:alert, no distress and comfortable  NEURO: alert & oriented x 3 with fluent speech, no focal motor/sensory deficits  LABORATORY DATA:  I have reviewed the data as listed    Component Value Date/Time   NA 138 01/31/2023 1544   NA 139 01/10/2022 1644   K 3.4 (L) 01/31/2023 1544   CL 102 01/31/2023 1544   CO2 27 01/31/2023 1544   GLUCOSE 122 (H) 01/31/2023 1544   GLUCOSE 93 09/10/2006 0834   BUN 21 01/31/2023 1544   BUN 13 01/10/2022 1644  CREATININE 1.43 (H) 01/31/2023 1544   CREATININE 0.64 07/13/2014 1621   CALCIUM 9.7 01/31/2023 1544   CALCIUM 9.7 03/29/2010 1554   PROT 7.4 01/31/2023 1544   PROT 7.6 11/09/2020 1156   ALBUMIN 4.2 01/31/2023 1544   ALBUMIN 4.4 11/09/2020 1156   AST 11 (L) 01/31/2023 1544   ALT 8 01/31/2023 1544   ALKPHOS 123 01/31/2023 1544   BILITOT 0.4 01/31/2023 1544   GFRNONAA 40 (L) 01/31/2023 1544   GFRNONAA >89 07/13/2014 1621   GFRAA 86 11/09/2020 1156   GFRAA >89 07/13/2014 1621    No results found for: "SPEP", "UPEP"  Lab Results  Component Value Date   WBC 9.4 01/31/2023   NEUTROABS 6.4 01/31/2023   HGB 9.9 (L) 01/31/2023   HCT 30.8 (L) 01/31/2023   MCV 81.1 01/31/2023   PLT 361 01/31/2023      Chemistry      Component Value Date/Time   NA 138 01/31/2023 1544   NA 139 01/10/2022 1644   K 3.4 (L) 01/31/2023 1544   CL 102 01/31/2023 1544   CO2 27 01/31/2023 1544   BUN 21 01/31/2023 1544   BUN 13 01/10/2022 1644   CREATININE 1.43 (H) 01/31/2023 1544   CREATININE 0.64 07/13/2014 1621      Component Value Date/Time   CALCIUM 9.7 01/31/2023 1544   CALCIUM 9.7 03/29/2010 1554   ALKPHOS 123 01/31/2023 1544   AST 11 (L) 01/31/2023 1544   ALT 8 01/31/2023 1544   BILITOT 0.4 01/31/2023 1544       RADIOGRAPHIC STUDIES:I have reviewed outside imaging with her I have personally reviewed the radiological images as  listed and agreed with the findings in the report.

## 2023-02-04 ENCOUNTER — Other Ambulatory Visit: Payer: Self-pay | Admitting: Internal Medicine

## 2023-02-04 NOTE — Telephone Encounter (Signed)
  losartan (COZAAR) 100 MG tablet    hydrALAZINE (APRESOLINE) 25 MG tablet   [facility] [address], [place] - [address] (Ph: [phone])

## 2023-02-05 ENCOUNTER — Other Ambulatory Visit (HOSPITAL_COMMUNITY): Payer: Self-pay

## 2023-02-05 ENCOUNTER — Telehealth: Payer: Self-pay

## 2023-02-05 NOTE — Telephone Encounter (Signed)
Returned Pt's call regarding 02/07/23 appt. Pt states she was told she had a lab appt but thought it was only infusion. Confirmed with Pt that it is only an infusion appt on 04/12. Pt asking if she can have lab appts on separate days from infusion to ensure infusion I son time. Discussed with Pt that her lab appt does not always affect infusion time as it can be different every day. Pt also asked if she has rx for compazine, confirmed compazine rx ordered on 01/31/23 to [facility] pharmacy. Pt verbalized understanding.

## 2023-02-06 ENCOUNTER — Other Ambulatory Visit: Payer: Self-pay | Admitting: Hematology and Oncology

## 2023-02-06 ENCOUNTER — Telehealth: Payer: Self-pay

## 2023-02-06 MED FILL — Dexamethasone Sodium Phosphate Inj 100 MG/10ML: INTRAMUSCULAR | Qty: 1 | Status: AC

## 2023-02-06 MED FILL — Fosaprepitant Dimeglumine For IV Infusion 150 MG (Base Eq): INTRAVENOUS | Qty: 5 | Status: AC

## 2023-02-06 NOTE — Telephone Encounter (Signed)
Returned her call. She vomited x 2 today. She took compazine then vomited 5 mins later 30 mins ago. Instructed to take Zofran now with just a sip of water. Reviewed decadron prescription instructions for tonight and in the am. She verbalized understanding and is aware of appt time tomorrow.  FYI

## 2023-02-07 ENCOUNTER — Telehealth: Payer: Self-pay

## 2023-02-07 ENCOUNTER — Inpatient Hospital Stay: Payer: Medicare HMO

## 2023-02-07 ENCOUNTER — Other Ambulatory Visit: Payer: Self-pay

## 2023-02-07 ENCOUNTER — Encounter: Payer: Self-pay | Admitting: Hematology and Oncology

## 2023-02-07 VITALS — BP 135/58 | HR 88 | Temp 97.9°F | Resp 18 | Wt 183.0 lb

## 2023-02-07 DIAGNOSIS — Z888 Allergy status to other drugs, medicaments and biological substances status: Secondary | ICD-10-CM | POA: Diagnosis not present

## 2023-02-07 DIAGNOSIS — Z5112 Encounter for antineoplastic immunotherapy: Secondary | ICD-10-CM | POA: Diagnosis not present

## 2023-02-07 DIAGNOSIS — C55 Malignant neoplasm of uterus, part unspecified: Secondary | ICD-10-CM

## 2023-02-07 DIAGNOSIS — Z880 Allergy status to penicillin: Secondary | ICD-10-CM | POA: Diagnosis not present

## 2023-02-07 DIAGNOSIS — Z7901 Long term (current) use of anticoagulants: Secondary | ICD-10-CM | POA: Diagnosis not present

## 2023-02-07 DIAGNOSIS — Z79620 Long term (current) use of immunosuppressive biologic: Secondary | ICD-10-CM | POA: Diagnosis not present

## 2023-02-07 DIAGNOSIS — Z5111 Encounter for antineoplastic chemotherapy: Secondary | ICD-10-CM | POA: Diagnosis not present

## 2023-02-07 DIAGNOSIS — Z885 Allergy status to narcotic agent status: Secondary | ICD-10-CM | POA: Diagnosis not present

## 2023-02-07 DIAGNOSIS — Z79630 Long term (current) use of alkylating agent: Secondary | ICD-10-CM | POA: Diagnosis not present

## 2023-02-07 DIAGNOSIS — Z79899 Other long term (current) drug therapy: Secondary | ICD-10-CM | POA: Diagnosis not present

## 2023-02-07 DIAGNOSIS — K30 Functional dyspepsia: Secondary | ICD-10-CM | POA: Diagnosis not present

## 2023-02-07 DIAGNOSIS — K5730 Diverticulosis of large intestine without perforation or abscess without bleeding: Secondary | ICD-10-CM | POA: Diagnosis not present

## 2023-02-07 DIAGNOSIS — K219 Gastro-esophageal reflux disease without esophagitis: Secondary | ICD-10-CM | POA: Diagnosis not present

## 2023-02-07 DIAGNOSIS — Z79633 Long term (current) use of mitotic inhibitor: Secondary | ICD-10-CM | POA: Diagnosis not present

## 2023-02-07 MED ORDER — SODIUM CHLORIDE 0.9% FLUSH
10.0000 mL | INTRAVENOUS | Status: DC | PRN
  Administered 2023-02-07: 10 mL

## 2023-02-07 MED ORDER — SODIUM CHLORIDE 0.9 % IV SOLN
10.0000 mg | Freq: Once | INTRAVENOUS | Status: AC
  Administered 2023-02-07: 10 mg via INTRAVENOUS
  Filled 2023-02-07: qty 10

## 2023-02-07 MED ORDER — SODIUM CHLORIDE 0.9 % IV SOLN
370.0000 mg | Freq: Once | INTRAVENOUS | Status: AC
  Administered 2023-02-07: 370 mg via INTRAVENOUS
  Filled 2023-02-07: qty 37

## 2023-02-07 MED ORDER — PALONOSETRON HCL INJECTION 0.25 MG/5ML
0.2500 mg | Freq: Once | INTRAVENOUS | Status: AC
  Administered 2023-02-07: 0.25 mg via INTRAVENOUS
  Filled 2023-02-07: qty 5

## 2023-02-07 MED ORDER — FAMOTIDINE IN NACL 20-0.9 MG/50ML-% IV SOLN
20.0000 mg | Freq: Once | INTRAVENOUS | Status: AC
  Administered 2023-02-07: 20 mg via INTRAVENOUS
  Filled 2023-02-07: qty 50

## 2023-02-07 MED ORDER — SODIUM CHLORIDE 0.9 % IV SOLN
500.0000 mg | Freq: Once | INTRAVENOUS | Status: AC
  Administered 2023-02-07: 500 mg via INTRAVENOUS
  Filled 2023-02-07: qty 10

## 2023-02-07 MED ORDER — SODIUM CHLORIDE 0.9 % IV SOLN
150.0000 mg | Freq: Once | INTRAVENOUS | Status: AC
  Administered 2023-02-07: 150 mg via INTRAVENOUS
  Filled 2023-02-07: qty 150

## 2023-02-07 MED ORDER — SODIUM CHLORIDE 0.9 % IV SOLN
175.0000 mg/m2 | Freq: Once | INTRAVENOUS | Status: AC
  Administered 2023-02-07: 336 mg via INTRAVENOUS
  Filled 2023-02-07: qty 56

## 2023-02-07 MED ORDER — SODIUM CHLORIDE 0.9 % IV SOLN: Freq: Once | INTRAVENOUS | Status: AC

## 2023-02-07 MED ORDER — CETIRIZINE HCL 10 MG/ML IV SOLN
10.0000 mg | Freq: Once | INTRAVENOUS | Status: AC
  Administered 2023-02-07: 10 mg via INTRAVENOUS
  Filled 2023-02-07: qty 1

## 2023-02-07 MED ORDER — HEPARIN SOD (PORK) LOCK FLUSH 100 UNIT/ML IV SOLN
500.0000 [IU] | Freq: Once | INTRAVENOUS | Status: AC | PRN
  Administered 2023-02-07: 500 [IU]

## 2023-02-07 NOTE — Patient Instructions (Signed)
[facility]  Discharge Instructions: Thank you for choosing [facility] to provide your oncology and hematology care.   If you have a lab appointment with the Cancer Center, please go directly to the Cancer Center and check in at the registration area.   Wear comfortable clothing and clothing appropriate for easy access to any Portacath or PICC line.   We strive to give you quality time with your provider. You may need to reschedule your appointment if you arrive late (15 or more minutes).  Arriving late affects you and other patients whose appointments are after yours.  Also, if you miss three or more appointments without notifying the office, you may be dismissed from the clinic at the provider's discretion.      For prescription refill requests, have your pharmacy contact our office and allow 72 hours for refills to be completed.    Today you received the following chemotherapy and/or immunotherapy agents Dostarlimab, Taxol, Carboplatin      To help prevent nausea and vomiting after your treatment, we encourage you to take your nausea medication as directed.  BELOW ARE SYMPTOMS THAT SHOULD BE REPORTED IMMEDIATELY: *FEVER GREATER THAN 100.4 F (38 C) OR HIGHER *CHILLS OR SWEATING *NAUSEA AND VOMITING THAT IS NOT CONTROLLED WITH YOUR NAUSEA MEDICATION *UNUSUAL SHORTNESS OF BREATH *UNUSUAL BRUISING OR BLEEDING *URINARY PROBLEMS (pain or burning when urinating, or frequent urination) *BOWEL PROBLEMS (unusual diarrhea, constipation, pain near the anus) TENDERNESS IN MOUTH AND THROAT WITH OR WITHOUT PRESENCE OF ULCERS (sore throat, sores in mouth, or a toothache) UNUSUAL RASH, SWELLING OR PAIN  UNUSUAL VAGINAL DISCHARGE OR ITCHING   Items with * indicate a potential emergency and should be followed up as soon as possible or go to the Emergency Department if any problems should occur.  Please show the CHEMOTHERAPY ALERT CARD or  IMMUNOTHERAPY ALERT CARD at check-in to the Emergency Department and triage nurse.  Should you have questions after your visit or need to cancel or reschedule your appointment, please contact [facility]  Dept: [phone]  and follow the prompts.  Office hours are 8:00 a.m. to 4:30 p.m. Monday - Friday. Please note that voicemails left after 4:00 p.m. may not be returned until the following business day.  We are closed weekends and major holidays. You have access to a nurse at all times for urgent questions. Please call the main number to the clinic Dept: [phone] and follow the prompts.   For any non-urgent questions, you may also contact your provider using MyChart. We now offer e-Visits for anyone 89 and older to request care online for non-urgent symptoms. For details visit [email or web address].   Also download the MyChart app! Go to the app store, search "MyChart", open the app, select [facility], and log in with your MyChart username and password.  Dostarlimab Injection What is this medication? DOSTARLIMAB (dos tar li mab) treats some types of cancer. It works by helping your immune system slow or stop the spread of cancer cells. It is a monoclonal antibody. This medicine may be used for other purposes; ask your health care provider or pharmacist if you have questions. COMMON BRAND NAME(S): Jemperli What should I tell my care team before I take this medication? They need to know if you have any of these conditions: Allogeneic stem cell transplant (uses someone else's stem cells) Autoimmune diseases, such as Crohn disease, ulcerative colitis, lupus History of chest radiation  Nervous system problems, such as Guillain-Barre syndrome, myasthenia gravis Organ transplant An unusual or allergic reaction to dostarlimab, other medications, foods, dyes, or preservatives Pregnant or trying to get pregnant Breast-feeding How should I use this  medication? This medication is injected into a vein. It is given by your care team in a hospital or clinic setting. A special MedGuide will be given to you before each treatment. Be sure to read this information carefully each time. Talk to your care team about the use of this medication in children. Special care may be needed. Overdosage: If you think you have taken too much of this medicine contact a poison control center or emergency room at once. NOTE: This medicine is only for you. Do not share this medicine with others. What if I miss a dose? Keep appointments for follow-up doses. It is important not to miss your dose. Call your care team if you are unable to keep an appointment. What may interact with this medication? Interactions have not been studied. This list may not describe all possible interactions. Give your health care provider a list of all the medicines, herbs, non-prescription drugs, or dietary supplements you use. Also tell them if you smoke, drink alcohol, or use illegal drugs. Some items may interact with your medicine. What should I watch for while using this medication? Your condition will be monitored carefully while you are receiving this medication. You may need blood work while taking this medication. This medication may cause serious skin reactions. They can happen weeks to months after starting the medication. Contact your care team right away if you notice fevers or flu-like symptoms with a rash. The rash may be red or purple and then turn into blisters or peeling of the skin. You may also notice a red rash with swelling of the face, lips, or lymph nodes in your neck or under your arms. Tell your care team right away if you have any change in your eyesight. Talk to your care team if you may be pregnant. Serious birth defects can occur if you take this medication during pregnancy and for 4 months after the last dose. You will need a negative pregnancy test before starting  this medication. Contraception is recommended while taking this medication and for 4 months after the last dose. Your care team can help you find the option that works for you. Do not breastfeed while taking this medication and for 4 months after the last dose. What side effects may I notice from receiving this medication? Side effects that you should report to your care team as soon as possible: Allergic reactions--skin rash, itching, hives, swelling of the face, lips, tongue, or throat Dry cough, shortness of breath or trouble breathing Eye pain, redness, irritation, or discharge with blurry or decreased vision Heart muscle inflammation--unusual weakness or fatigue, shortness of breath, chest pain, fast or irregular heartbeat, dizziness, swelling of the ankles, feet, or hands Hormone gland problems--headache, sensitivity to light, unusual weakness or fatigue, dizziness, fast or irregular heartbeat, increased sensitivity to cold or heat, excessive sweating, constipation, hair loss, increased thirst or amount of urine, tremors or shaking, irritability Infusion reactions--chest pain, shortness of breath or trouble breathing, feeling faint or lightheaded Kidney injury (glomerulonephritis)--decrease in the amount of urine, red or dark brown urine, foamy or bubbly urine, swelling of the ankles, hands, or feet Liver injury--right upper belly pain, loss of appetite, nausea, light-colored stool, dark yellow or brown urine, yellowing skin or eyes, unusual weakness or fatigue Pain, tingling,  or numbness in the hands or feet, muscle weakness, change in vision, confusion or trouble speaking, loss of balance or coordination, trouble walking, seizures Rash, fever, and swollen lymph nodes Redness, blistering, peeling, or loosening of the skin, including inside the mouth Sudden or severe stomach pain, bloody diarrhea, fever, nausea, vomiting Side effects that usually do not require medical attention (report these  to your care team if they continue or are bothersome): Bone, joint, or muscle pain Diarrhea Fatigue Loss of appetite Nausea Skin rash This list may not describe all possible side effects. Call your doctor for medical advice about side effects. You may report side effects to FDA at 1-800-FDA-1088. Where should I keep my medication? This medication is given in a hospital or clinic. It will not be stored at home. NOTE: This sheet is a summary. It may not cover all possible information. If you have questions about this medicine, talk to your doctor, pharmacist, or health care provider.  2023 Elsevier/Gold Standard (2021-12-27 00:00:00)

## 2023-02-07 NOTE — Patient Outreach (Signed)
  [facility] Care Coordination Follow Up  02/07/2023 Name:  Sherri Gill MRN:  [id number] DOB:  Dec 11, 1950  Successful call to patient for follow up.  She informed this Clinical research associate she was in line at the grocery store and she will return the call, phone number provided. Jodelle Gross, RN, BSN, CCM Care Management Coordinator /[facility] Phone: [phone]/Fax: [phone]

## 2023-02-08 ENCOUNTER — Ambulatory Visit (HOSPITAL_BASED_OUTPATIENT_CLINIC_OR_DEPARTMENT_OTHER): Payer: Medicare HMO

## 2023-02-11 ENCOUNTER — Other Ambulatory Visit: Payer: Medicare HMO

## 2023-02-11 ENCOUNTER — Telehealth: Payer: Self-pay

## 2023-02-11 ENCOUNTER — Other Ambulatory Visit: Payer: Self-pay | Admitting: Hematology and Oncology

## 2023-02-11 ENCOUNTER — Ambulatory Visit: Payer: Medicare HMO | Admitting: Hematology and Oncology

## 2023-02-11 NOTE — Telephone Encounter (Signed)
Returned her call. She is complaining of no appetite and having a hard time eating. She is drinking but not sure if she is drinking enough fluids. Instructed to eat 5-6 small meals and push oral fluids today. She is requesting IV fluids tomorrow. Told her I would call her back after speaking with charge nurse.

## 2023-02-11 NOTE — Telephone Encounter (Addendum)
Called back and scheduled IV fluids tomorrow. She is scheduled at 9 am tomorrow 4/17 for IV fluids and is aware of appt.  FYI

## 2023-02-11 NOTE — Telephone Encounter (Signed)
I placed orders for IVF 

## 2023-02-12 ENCOUNTER — Other Ambulatory Visit: Payer: Self-pay

## 2023-02-12 ENCOUNTER — Inpatient Hospital Stay: Payer: Medicare HMO

## 2023-02-12 VITALS — BP 142/67 | HR 72 | Temp 98.4°F | Resp 16

## 2023-02-12 DIAGNOSIS — Z79633 Long term (current) use of mitotic inhibitor: Secondary | ICD-10-CM | POA: Diagnosis not present

## 2023-02-12 DIAGNOSIS — Z5111 Encounter for antineoplastic chemotherapy: Secondary | ICD-10-CM | POA: Diagnosis not present

## 2023-02-12 DIAGNOSIS — K5730 Diverticulosis of large intestine without perforation or abscess without bleeding: Secondary | ICD-10-CM | POA: Diagnosis not present

## 2023-02-12 DIAGNOSIS — K30 Functional dyspepsia: Secondary | ICD-10-CM | POA: Diagnosis not present

## 2023-02-12 DIAGNOSIS — K219 Gastro-esophageal reflux disease without esophagitis: Secondary | ICD-10-CM | POA: Diagnosis not present

## 2023-02-12 DIAGNOSIS — Z5112 Encounter for antineoplastic immunotherapy: Secondary | ICD-10-CM | POA: Diagnosis not present

## 2023-02-12 DIAGNOSIS — Z888 Allergy status to other drugs, medicaments and biological substances status: Secondary | ICD-10-CM | POA: Diagnosis not present

## 2023-02-12 DIAGNOSIS — C55 Malignant neoplasm of uterus, part unspecified: Secondary | ICD-10-CM

## 2023-02-12 DIAGNOSIS — Z79630 Long term (current) use of alkylating agent: Secondary | ICD-10-CM | POA: Diagnosis not present

## 2023-02-12 DIAGNOSIS — Z880 Allergy status to penicillin: Secondary | ICD-10-CM | POA: Diagnosis not present

## 2023-02-12 DIAGNOSIS — Z885 Allergy status to narcotic agent status: Secondary | ICD-10-CM | POA: Diagnosis not present

## 2023-02-12 DIAGNOSIS — Z79899 Other long term (current) drug therapy: Secondary | ICD-10-CM | POA: Diagnosis not present

## 2023-02-12 DIAGNOSIS — Z79620 Long term (current) use of immunosuppressive biologic: Secondary | ICD-10-CM | POA: Diagnosis not present

## 2023-02-12 DIAGNOSIS — Z7901 Long term (current) use of anticoagulants: Secondary | ICD-10-CM | POA: Diagnosis not present

## 2023-02-12 MED ORDER — SODIUM CHLORIDE 0.9% FLUSH: 3.0000 mL | Freq: Once | INTRAVENOUS | Status: DC | PRN

## 2023-02-12 MED ORDER — SODIUM CHLORIDE 0.9% FLUSH: 10.0000 mL | Freq: Once | INTRAVENOUS | Status: DC

## 2023-02-12 MED ORDER — ALTEPLASE 2 MG IJ SOLR: 2.0000 mg | Freq: Once | INTRAMUSCULAR | Status: DC | PRN

## 2023-02-12 MED ORDER — SODIUM CHLORIDE 0.9% FLUSH
10.0000 mL | Freq: Once | INTRAVENOUS | Status: AC | PRN
  Administered 2023-02-12: 10 mL

## 2023-02-12 MED ORDER — SODIUM CHLORIDE 0.9 % IV SOLN: Freq: Once | INTRAVENOUS | Status: AC

## 2023-02-12 MED ORDER — HEPARIN SOD (PORK) LOCK FLUSH 100 UNIT/ML IV SOLN
500.0000 [IU] | Freq: Once | INTRAVENOUS | Status: AC | PRN
  Administered 2023-02-12: 500 [IU]

## 2023-02-12 MED ORDER — HEPARIN SOD (PORK) LOCK FLUSH 100 UNIT/ML IV SOLN: 250.0000 [IU] | Freq: Once | INTRAVENOUS | Status: DC | PRN

## 2023-02-12 MED ORDER — HEPARIN SOD (PORK) LOCK FLUSH 100 UNIT/ML IV SOLN: 500.0000 [IU] | Freq: Once | INTRAVENOUS | Status: DC

## 2023-02-12 NOTE — Patient Instructions (Signed)

## 2023-02-13 ENCOUNTER — Telehealth: Payer: Self-pay

## 2023-02-13 ENCOUNTER — Other Ambulatory Visit (HOSPITAL_COMMUNITY): Payer: Self-pay

## 2023-02-13 ENCOUNTER — Other Ambulatory Visit: Payer: Self-pay | Admitting: Hematology and Oncology

## 2023-02-13 DIAGNOSIS — C55 Malignant neoplasm of uterus, part unspecified: Secondary | ICD-10-CM

## 2023-02-13 MED ORDER — HYDROMORPHONE HCL 2 MG PO TABS
2.0000 mg | ORAL_TABLET | Freq: Three times a day (TID) | ORAL | 0 refills | Status: DC | PRN
  Filled 2023-02-13: qty 30, 10d supply, fill #0

## 2023-02-13 NOTE — Telephone Encounter (Signed)
Pain medicine will make her constipation worse She needs to take laxatives daily I will send a very low dose pain medicine for her but I advise she only take it rarely as needed

## 2023-02-13 NOTE — Telephone Encounter (Signed)
Called and given below message. She verbalized understanding and will call the office back for questions/concerns. 

## 2023-02-13 NOTE — Telephone Encounter (Addendum)
Returned her call. She is having upper abdominal pain that kept her up last night. She tried tylenol but did not get much relief. She is asking if she could get pain medication to [facility] pharmacy? She is worried about the weekend.  She was constipated. But had a small soft bm this am and feels a little better. She will continue laxatives. She is eating 5 to 6 small meals a day and thinks that she is eating better.

## 2023-02-14 ENCOUNTER — Other Ambulatory Visit (HOSPITAL_COMMUNITY): Payer: Self-pay

## 2023-02-15 ENCOUNTER — Other Ambulatory Visit: Payer: Self-pay

## 2023-02-17 ENCOUNTER — Telehealth: Payer: Self-pay

## 2023-02-17 ENCOUNTER — Other Ambulatory Visit: Payer: Self-pay | Admitting: Internal Medicine

## 2023-02-17 NOTE — Telephone Encounter (Signed)
Returned her call. She started vomiting last night starting late pm until early this am. She took Zofran at 2 am. Instructed to take Compazine now. Alternate Zofran and compazine until nausea is relieved. She is verbalized understanding. She thinks the nausea may be from something that she ate. She is feeling weak. Offered IV fluids, she declined IV fluids due to feeling so weak. She does not think that she needs to go the ER. She will call back this afternoon if she would like IV fluids tomorrow. She is going to push oral fluids today.

## 2023-02-17 NOTE — Telephone Encounter (Signed)
Next appt scheduled  4/25 with PCP. 

## 2023-02-19 ENCOUNTER — Inpatient Hospital Stay: Payer: Medicare HMO

## 2023-02-19 ENCOUNTER — Telehealth: Payer: Self-pay

## 2023-02-19 ENCOUNTER — Other Ambulatory Visit: Payer: Self-pay

## 2023-02-19 VITALS — BP 147/64 | HR 67 | Temp 98.9°F | Resp 20

## 2023-02-19 DIAGNOSIS — C55 Malignant neoplasm of uterus, part unspecified: Secondary | ICD-10-CM | POA: Diagnosis not present

## 2023-02-19 DIAGNOSIS — Z5112 Encounter for antineoplastic immunotherapy: Secondary | ICD-10-CM | POA: Diagnosis not present

## 2023-02-19 DIAGNOSIS — E860 Dehydration: Secondary | ICD-10-CM

## 2023-02-19 DIAGNOSIS — Z5111 Encounter for antineoplastic chemotherapy: Secondary | ICD-10-CM | POA: Diagnosis not present

## 2023-02-19 DIAGNOSIS — Z888 Allergy status to other drugs, medicaments and biological substances status: Secondary | ICD-10-CM | POA: Diagnosis not present

## 2023-02-19 DIAGNOSIS — K219 Gastro-esophageal reflux disease without esophagitis: Secondary | ICD-10-CM | POA: Diagnosis not present

## 2023-02-19 DIAGNOSIS — Z7901 Long term (current) use of anticoagulants: Secondary | ICD-10-CM | POA: Diagnosis not present

## 2023-02-19 DIAGNOSIS — Z79620 Long term (current) use of immunosuppressive biologic: Secondary | ICD-10-CM | POA: Diagnosis not present

## 2023-02-19 DIAGNOSIS — Z79899 Other long term (current) drug therapy: Secondary | ICD-10-CM | POA: Diagnosis not present

## 2023-02-19 DIAGNOSIS — K30 Functional dyspepsia: Secondary | ICD-10-CM | POA: Diagnosis not present

## 2023-02-19 DIAGNOSIS — Z880 Allergy status to penicillin: Secondary | ICD-10-CM | POA: Diagnosis not present

## 2023-02-19 DIAGNOSIS — Z79633 Long term (current) use of mitotic inhibitor: Secondary | ICD-10-CM | POA: Diagnosis not present

## 2023-02-19 DIAGNOSIS — Z885 Allergy status to narcotic agent status: Secondary | ICD-10-CM | POA: Diagnosis not present

## 2023-02-19 DIAGNOSIS — Z79630 Long term (current) use of alkylating agent: Secondary | ICD-10-CM | POA: Diagnosis not present

## 2023-02-19 DIAGNOSIS — K5730 Diverticulosis of large intestine without perforation or abscess without bleeding: Secondary | ICD-10-CM | POA: Diagnosis not present

## 2023-02-19 LAB — CBC WITH DIFFERENTIAL (CANCER CENTER ONLY)
Abs Immature Granulocytes: 0.01 10*3/uL (ref 0.00–0.07)
Basophils Absolute: 0 10*3/uL (ref 0.0–0.1)
Basophils Relative: 0 %
Eosinophils Absolute: 0.1 10*3/uL (ref 0.0–0.5)
Eosinophils Relative: 2 %
HCT: 27.4 % — ABNORMAL LOW (ref 36.0–46.0)
Hemoglobin: 8.7 g/dL — ABNORMAL LOW (ref 12.0–15.0)
Immature Granulocytes: 0 %
Lymphocytes Relative: 40 %
Lymphs Abs: 1.5 10*3/uL (ref 0.7–4.0)
MCH: 25.7 pg — ABNORMAL LOW (ref 26.0–34.0)
MCHC: 31.8 g/dL (ref 30.0–36.0)
MCV: 81.1 fL (ref 80.0–100.0)
Monocytes Absolute: 0.7 10*3/uL (ref 0.1–1.0)
Monocytes Relative: 18 %
Neutro Abs: 1.5 10*3/uL — ABNORMAL LOW (ref 1.7–7.7)
Neutrophils Relative %: 40 %
Platelet Count: 329 10*3/uL (ref 150–400)
RBC: 3.38 MIL/uL — ABNORMAL LOW (ref 3.87–5.11)
RDW: 16.5 % — ABNORMAL HIGH (ref 11.5–15.5)
WBC Count: 3.8 10*3/uL — ABNORMAL LOW (ref 4.0–10.5)
nRBC: 0 % (ref 0.0–0.2)

## 2023-02-19 LAB — CMP (CANCER CENTER ONLY)
ALT: 11 U/L (ref 0–44)
AST: 14 U/L — ABNORMAL LOW (ref 15–41)
Albumin: 3.9 g/dL (ref 3.5–5.0)
Alkaline Phosphatase: 108 U/L (ref 38–126)
Anion gap: 7 (ref 5–15)
BUN: 10 mg/dL (ref 8–23)
CO2: 30 mmol/L (ref 22–32)
Calcium: 9.3 mg/dL (ref 8.9–10.3)
Chloride: 102 mmol/L (ref 98–111)
Creatinine: 0.87 mg/dL (ref 0.44–1.00)
GFR, Estimated: >60 mL/min (ref >60)
Glucose, Bld: 104 mg/dL — ABNORMAL HIGH (ref 70–99)
Potassium: 3.2 mmol/L — ABNORMAL LOW (ref 3.5–5.1)
Sodium: 139 mmol/L (ref 135–145)
Total Bilirubin: 0.3 mg/dL (ref 0.3–1.2)
Total Protein: 6.8 g/dL (ref 6.5–8.1)

## 2023-02-19 MED ORDER — SODIUM CHLORIDE 0.9% FLUSH
10.0000 mL | Freq: Once | INTRAVENOUS | Status: AC | PRN
  Administered 2023-02-19: 10 mL

## 2023-02-19 MED ORDER — HEPARIN SOD (PORK) LOCK FLUSH 100 UNIT/ML IV SOLN
500.0000 [IU] | Freq: Once | INTRAVENOUS | Status: AC | PRN
  Administered 2023-02-19: 500 [IU]

## 2023-02-19 MED ORDER — SODIUM CHLORIDE 0.9 % IV SOLN: Freq: Once | INTRAVENOUS | Status: AC

## 2023-02-19 NOTE — Patient Instructions (Signed)

## 2023-02-19 NOTE — Telephone Encounter (Signed)
Returned Pt's call regarding "belching". Pt states she is no longer vomiting and is able to keep fluids down but is having significant gas and still feels weak. Pt reports she has eaten grits and a banana sandwich with mayonnaise. Pt asking if there is medication she can take for indigestion. Pt reports she is taking protonix. Encouraged Pt to continue Protonix, take TUMS as needed, and to stick with a more bland diet until her sx resolve. Per RN, [facility] unavailable today. Pt scheduled for labs and IVF with infusion due to weakness. Pt verbalized understanding. Labs ordered and appts scheduled.

## 2023-02-20 ENCOUNTER — Ambulatory Visit (INDEPENDENT_AMBULATORY_CARE_PROVIDER_SITE_OTHER): Payer: Medicare HMO | Admitting: Internal Medicine

## 2023-02-20 ENCOUNTER — Ambulatory Visit (INDEPENDENT_AMBULATORY_CARE_PROVIDER_SITE_OTHER): Payer: Medicare HMO

## 2023-02-20 VITALS — BP 138/57 | HR 78 | Temp 97.8°F | Ht 63.0 in | Wt 177.3 lb

## 2023-02-20 VITALS — BP 138/57 | HR 68 | Temp 97.8°F | Ht 63.0 in | Wt 177.3 lb

## 2023-02-20 DIAGNOSIS — E1149 Type 2 diabetes mellitus with other diabetic neurological complication: Secondary | ICD-10-CM

## 2023-02-20 DIAGNOSIS — C55 Malignant neoplasm of uterus, part unspecified: Secondary | ICD-10-CM | POA: Diagnosis not present

## 2023-02-20 DIAGNOSIS — I10 Essential (primary) hypertension: Secondary | ICD-10-CM | POA: Diagnosis not present

## 2023-02-20 DIAGNOSIS — I480 Paroxysmal atrial fibrillation: Secondary | ICD-10-CM | POA: Diagnosis not present

## 2023-02-20 DIAGNOSIS — Z7984 Long term (current) use of oral hypoglycemic drugs: Secondary | ICD-10-CM | POA: Diagnosis not present

## 2023-02-20 DIAGNOSIS — Z0000 Encounter for general adult medical examination without abnormal findings: Secondary | ICD-10-CM | POA: Diagnosis not present

## 2023-02-20 LAB — POCT GLYCOSYLATED HEMOGLOBIN (HGB A1C): Hemoglobin A1C: 6.4 % — AB (ref 4.0–5.6)

## 2023-02-20 LAB — GLUCOSE, CAPILLARY: Glucose-Capillary: 132 mg/dL — ABNORMAL HIGH (ref 70–99)

## 2023-02-20 MED ORDER — DILTIAZEM HCL ER COATED BEADS 120 MG PO CP24
120.0000 mg | ORAL_CAPSULE | Freq: Every day | ORAL | 11 refills | Status: DC
Start: 2023-02-20 — End: 2023-08-27

## 2023-02-20 MED ORDER — PANTOPRAZOLE SODIUM 40 MG PO TBEC
40.0000 mg | DELAYED_RELEASE_TABLET | Freq: Every day | ORAL | 0 refills | Status: DC
Start: 2023-02-20 — End: 2023-08-27

## 2023-02-20 NOTE — Assessment & Plan Note (Addendum)
Beginning Dostarlimab for recurrent disease.  I'm concerned that her abdominal symptoms are due to this; she may have even had a now-resolved partial SBO (large volume bilious vomiting).  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 I don't believe Sherri Gill has a good understanding of the degree to which her cancer is spread.  I will reach out to her oncologist to further discuss staging and prognosis, and what if anything should be done to address her obstructed L ureter. Meanwhile, will refer her to behavioral health therapist in our clinic.  She is feeling very weak and desires assistance with optimizing physical function while undergoing cancer care (goal of cure per Dr. [name] notes).

## 2023-02-20 NOTE — Progress Notes (Signed)
Subjective:   Sherri Gill is a 72 y.o. female who presents for an Initial Medicare Annual Wellness Visit. I connected with  Sherri Gill on 02/20/23 by a  Face-To-Face encounter  and verified that I am speaking with the correct person using two identifiers.  Patient Location: Other:  Office/Clinic  Provider Location: Office/Clinic  I discussed the limitations of evaluation and management by telemedicine. The patient expressed understanding and agreed to proceed.  Review of Systems    Defer to PCP       Objective:    Today's Vitals   02/20/23 1339 02/20/23 1340  BP: (!) 141/60 (!) 138/57  Pulse: 78   Temp: 97.8 F (36.6 C)   TempSrc: Oral   SpO2: 99%   Weight: 177 lb 4.8 oz (80.4 kg)   Height: 5\' 3"  (1.6 m)   PainSc: 0-No pain    Body mass index is 31.41 kg/m.     02/20/2023    1:42 PM 02/20/2023   10:38 AM 04/09/2022    3:00 PM 04/05/2022    1:37 PM 03/19/2022    3:16 PM 03/08/2022    3:12 PM 03/08/2022   11:51 AM  Advanced Directives  Does Patient Have a Medical Advance Directive? No No No No No No No  Would patient like information on creating a medical advance directive? No - Patient declined No - Patient declined No - Patient declined  Yes (MAU/Ambulatory/Procedural Areas - Information given) No - Patient declined No - Patient declined    Current Medications (verified) Outpatient Encounter Medications as of 02/20/2023  Medication Sig   acetaminophen (TYLENOL) 500 MG tablet Take 1,000 mg by mouth every 6 (six) hours as needed for mild pain.   albuterol (VENTOLIN HFA) 108 (90 Base) MCG/ACT inhaler Inhale 2 puffs into the lungs every 4 (four) hours as needed for wheezing or shortness of breath.   atorvastatin (LIPITOR) 20 MG tablet Take 1 tablet (20 mg total) by mouth at bedtime.   B Complex-C (B-COMPLEX WITH VITAMIN C) tablet Take 1 tablet by mouth in the morning.   Blood Glucose Monitoring Suppl (ONE TOUCH ULTRA MINI) w/Device KIT Please use as directed.    Cholecalciferol 1000 units capsule Take 1 capsule (1,000 Units total) by mouth daily.   dexamethasone (DECADRON) 4 MG tablet Take 2 tablets by mouth the night before and 2 tablets the morning of chemotherapy, every 3 weeks, for 6 cycles   diltiazem (CARDIZEM CD) 120 MG 24 hr capsule Take 1 capsule (120 mg total) by mouth daily.   dorzolamide-timolol (COSOPT) 22.3-6.8 MG/ML ophthalmic solution Place 1 drop into both eyes 2 (two) times daily.   glucose blood (ONE TOUCH ULTRA TEST) test strip Use as instructed   hydrALAZINE (APRESOLINE) 25 MG tablet TAKE 1 TABLET BY MOUTH THREE TIMES DAILY   HYDROmorphone (DILAUDID) 2 MG tablet Take 1 tablet (2 mg) by mouth 3 times daily as needed for severe pain.   Insulin Pen Needle 32G X 4 MM MISC 1 each by Does not apply route daily.   latanoprost (XALATAN) 0.005 % ophthalmic solution Place 1 drop into both eyes at bedtime.   lidocaine-prilocaine (EMLA) cream Apply to affected area once   lidocaine-prilocaine (EMLA) cream Apply to affected area once as directed   losartan (COZAAR) 100 MG tablet Take 1 tablet (100 mg total) by mouth at bedtime.   magnesium oxide (MAG-OX) 400 (240 Mg) MG tablet Take 1 tablet (400 mg total) by mouth daily.   metFORMIN (  GLUCOPHAGE) 1000 MG tablet Take 1 tablet (1,000 mg total) by mouth 2 (two) times daily.   ondansetron (ZOFRAN) 8 MG tablet Take 1 tablet (8 mg total) by mouth every 8 (eight) hours as needed for nausea or vomiting. Start on the third day after chemotherapy.   ONETOUCH DELICA LANCETS 33G MISC Please use as directed.   pantoprazole (PROTONIX) 40 MG tablet Take 1 tablet by mouth daily.   polyethylene glycol powder (GLYCOLAX/MIRALAX) 17 GM/SCOOP powder Take 17 g by mouth daily.   prochlorperazine (COMPAZINE) 10 MG tablet Take 1 tablet (10 mg total) by mouth every 6 (six) hours as needed for nausea or vomiting.   rivaroxaban (XARELTO) 20 MG TABS tablet Take 1 tablet (20 mg total) by mouth daily with supper.   No  facility-administered encounter medications on file as of 02/20/2023.    Allergies (verified) Codeine sulfate, Penicillins, Percocet [oxycodone-acetaminophen], and Zestril [lisinopril]   History: Past Medical History:  Diagnosis Date   AKI (acute kidney injury)    History of   Ascites    09/2021 paracentesis with almost 3 L of fluid 12/2021 Paracentesis performed with 2.8 L   Carpal tunnel syndrome 02/11/2014   Chronic anemia 08/29/2012   Need colonoscopy or report. Need iron panel.    Dyslipidemia 06/30/2007   Dyspnea    Essential hypertension, benign 06/30/2007   GERD 06/30/2007   History of blood transfusion    History of fatty infiltration of liver    History of migraine    Hx of Herpes simplex meningitis 2015   Also noted to have primary empty sella on imaging at this admission   Insomnia 06/11/2012   Major depressive disorder, recurrent episode, moderate with anxious distress 06/30/2007   Nausea and vomiting 01/13/2022   Obesity, BMI 35-40 06/11/2012   Peripheral neuropathy 2/2 T2DM 12/28/2009   Primary empty sella syndrome 2015   Noted on imaging during hospitalization for herpes meningitis; no pituitary mass, no hormone w/u at that time, hormonally asymptomatic   Type 2 diabetes mellitus with neurological complications 06/30/2007   Past Surgical History:  Procedure Laterality Date   COLONOSCOPY W/ POLYPECTOMY  06/28/2004   DENTAL SURGERY     DILATATION & CURETTAGE/HYSTEROSCOPY WITH MYOSURE N/A 01/15/2022   Procedure: DILATATION & CURETTAGE/HYSTEROSCOPY WITH MYOSURE;  Surgeon: Carver Fila, MD;  Location: [facility];  Service: Gynecology;  Laterality: N/A;  DO NOT OPEN HYSTEROSCOPY KIT   HYSTERECTOMY ABDOMINAL WITH SALPINGO-OOPHORECTOMY Bilateral 04/09/2022   Procedure: HYSTERECTOMY ABDOMINAL BILATERAL SALPINGO OOPHORECTOMY WITH OMENTECTOMY ,DEBULKING;  Surgeon: Carver Fila, MD;  Location: [facility];  Service: Gynecology;  Laterality: Bilateral;   IR IMAGING  GUIDED PORT INSERTION  01/18/2022   LAPAROSCOPY N/A 04/09/2022   Procedure: LAPAROSCOPY DIAGNOSTIC;  Surgeon: Carver Fila, MD;  Location: [facility];  Service: Gynecology;  Laterality: N/A;   OPERATIVE ULTRASOUND N/A 01/15/2022   Procedure: OPERATIVE ULTRASOUND;  Surgeon: Carver Fila, MD;  Location: [facility];  Service: Gynecology;  Laterality: N/A;   port a cath placement     TUBAL LIGATION     age 89   Family History  Adopted: Yes  Problem Relation Age of Onset   Dementia Mother    Breast cancer Sister        paternal half-sister   Liver cancer Brother        maternal half-brother   Thyroid cancer Maternal Aunt    Prostate cancer Maternal Uncle    Prostate cancer Cousin    Breast cancer Other  Ovarian cancer Neg Hx    Colon cancer Neg Hx    Endometrial cancer Neg Hx    Pancreatic cancer Neg Hx    Social History   Socioeconomic History   Marital status: Single    Spouse name: Not on file   Number of children: Not on file   Years of education: 12   Highest education level: Not on file  Occupational History   Occupation: Airline pilot    Employer: [employer]  Tobacco Use   Smoking status: Former    Packs/day: 0.25    Years: 10.00    Additional pack years: 0.00    Total pack years: 2.50    Types: Cigarettes    Quit date: 10/28/1978    Years since quitting: 44.3   Smokeless tobacco: Never  Vaping Use   Vaping Use: Never used  Substance and Sexual Activity   Alcohol use: No    Alcohol/week: 0.0 standard drinks of alcohol   Drug use: No   Sexual activity: Not Currently    Partners: Male    Birth control/protection: Post-menopausal  Other Topics Concern   Not on file  Social History Narrative   Not on file   Social Determinants of Health   Financial Resource Strain: Medium Risk (02/20/2023)   Overall Financial Resource Strain (CARDIA)    Difficulty of Paying Living Expenses: Somewhat hard  Food Insecurity: No Food Insecurity (02/20/2023)   Hunger Vital Sign     Worried About Running Out of Food in the Last Year: Never true    Ran Out of Food in the Last Year: Never true  Transportation Needs: No Transportation Needs (02/20/2023)   PRAPARE - Administrator, Civil Service (Medical): No    Lack of Transportation (Non-Medical): No  Physical Activity: Inactive (02/20/2023)   Exercise Vital Sign    Days of Exercise per Week: 0 days    Minutes of Exercise per Session: 0 min  Stress: Stress Concern Present (02/20/2023)   [employer] of Occupational Health - Occupational Stress Questionnaire    Feeling of Stress : To some extent  Social Connections: Unknown (02/20/2023)   Social Connection and Isolation Panel [NHANES]    Frequency of Communication with Friends and Family: More than three times a week    Frequency of Social Gatherings with Friends and Family: Once a week    Attends Religious Services: Patient declined    Database administrator or Organizations: No    Attends Engineer, structural: Patient declined    Marital Status: Patient declined    Tobacco Counseling Counseling given: Not Answered   Clinical Intake:  Pre-visit preparation completed: Yes  Pain : No/denies pain Pain Score: 0-No pain     Nutritional Risks: None Diabetes: Yes CBG done?: Yes CBG resulted in Enter/ Edit results?: Yes Did pt. bring in CBG monitor from home?: No  How often do you need to have someone help you when you read instructions, pamphlets, or other written materials from your doctor or pharmacy?: 1 - Never What is the last grade level you completed in school?: 12th grade/college  Diabetic?Nutrition Risk Assessment:  Has the patient had any N/V/D within the last 2 months?  No  Does the patient have any non-healing wounds?  No  Has the patient had any unintentional weight loss or weight gain?  No   Diabetes:  Is the patient diabetic?  Yes  If diabetic, was a CBG obtained today?  Yes  Did the patient  bring in their  glucometer from home?  No    Financial Strains and Diabetes Management:  Are you having any financial strains with the device, your supplies or your medication? No .  Does the patient want to be seen by Chronic Care Management for management of their diabetes?  No  Would the patient like to be referred to a Nutritionist or for Diabetic Management?  No   Diabetic Exams:  Diabetic Eye Exam: Completed 05/23/2022 Diabetic Foot Exam: Completed 08/22/2022    Interpreter Needed?: No  Information entered by :: Natassja Ollis,cma   Activities of Daily Living    02/20/2023    1:42 PM 02/20/2023   12:14 PM  In your present state of health, do you have any difficulty performing the following activities:  Hearing? 0 0  Vision? 0 0  Difficulty concentrating or making decisions? 0 0  Walking or climbing stairs? 0 0  Dressing or bathing? 0 0  Doing errands, shopping? 0 0    Patient Care Team: [name], MD as PCP - General (Internal Medicine) Rennis Golden Lisette Abu, MD as PCP - Cardiology (Cardiology) Jodelle Gross, RN as [facility] Care Management  Indicate any recent Medical Services you may have received from other than [facility] providers in the past year (date may be approximate).     Assessment:   This is a routine wellness examination for [place].  Hearing/Vision screen No results found.  Dietary issues and exercise activities discussed:     Goals Addressed   None   Depression Screen    02/20/2023    1:42 PM 02/20/2023   12:14 PM 08/22/2022   10:57 AM 11/02/2021    1:03 PM 09/06/2021    4:06 PM 08/23/2021   12:03 PM 05/31/2021    2:45 PM  PHQ 2/9 Scores  PHQ - 2 Score 0 0 0 0 0 0 0  PHQ- 9 Score   2 0 1  2    Fall Risk    02/20/2023    1:42 PM 02/20/2023   10:38 AM 08/22/2022    9:24 AM 11/02/2021    1:02 PM 09/28/2021   10:52 AM  Fall Risk   Falls in the past year? 0 0 0 0 0  Number falls in past yr: 0 0 0 0 0  Injury with Fall? 0 0 0 0 0  Risk  for fall due to : No Fall Risks    No Fall Risks  Follow up Falls evaluation completed;Falls prevention discussed Falls evaluation completed Falls evaluation completed Falls evaluation completed Falls evaluation completed    FALL RISK PREVENTION PERTAINING TO THE HOME:  Any stairs in or around the home? No  If so, are there any without handrails? No  Home free of loose throw rugs in walkways, pet beds, electrical cords, etc? Yes  Adequate lighting in your home to reduce risk of falls? Yes   ASSISTIVE DEVICES UTILIZED TO PREVENT FALLS:  Life alert? No  Use of a cane, walker or w/c? No  Grab bars in the bathroom? No  Shower chair or bench in shower? Yes  Elevated toilet seat or a handicapped toilet? No   TIMED UP AND GO:  Was the test performed? No .  Length of time to ambulate 10 feet: 0 sec.   Gait slow and steady without use of assistive device  Cognitive Function:        Immunizations Immunization History  Administered Date(s) Administered   Fluad Quad(high Dose  65+) 08/19/2022   Influenza Split 08/28/2012   Influenza Whole 11/18/2008, 10/17/2009   Influenza,inj,Quad PF,6+ Mos 10/12/2013, 09/25/2016, 08/07/2017, 08/31/2019, 11/09/2020   Influenza-Unspecified 08/23/2021   PFIZER(Purple Top)SARS-COV-2 Vaccination 01/08/2020, 02/02/2020   PNEUMOCOCCAL CONJUGATE-20 09/06/2021   Pneumococcal Polysaccharide-23 08/28/2012   Td 09/28/2021    TDAP status: Up to date  Flu Vaccine status: Up to date  Pneumococcal vaccine status: Up to date  Covid-19 vaccine status: Completed vaccines  Qualifies for Shingles Vaccine? No   Zostavax completed No   Shingrix Completed?: No.    Education has been provided regarding the importance of this vaccine. Patient has been advised to call insurance company to determine out of pocket expense if they have not yet received this vaccine. Advised may also receive vaccine at local pharmacy or Health Dept. Verbalized acceptance and  understanding.  Screening Tests Health Maintenance  Topic Date Due   Zoster Vaccines- Shingrix (1 of 2) Never done   COLONOSCOPY (Pts 45-44yrs Insurance coverage will need to be confirmed)  Never done   MAMMOGRAM  04/10/2013   DEXA SCAN  Never done   COVID-19 Vaccine (3 - Pfizer risk series) 03/01/2020   HEMOGLOBIN A1C  05/22/2023   OPHTHALMOLOGY EXAM  05/24/2023   INFLUENZA VACCINE  05/29/2023   Diabetic kidney evaluation - Urine ACR  08/23/2023   FOOT EXAM  08/23/2023   Diabetic kidney evaluation - eGFR measurement  02/19/2024   Medicare Annual Wellness (AWV)  02/20/2024   DTaP/Tdap/Td (2 - Tdap) 09/29/2031   Pneumonia Vaccine 68+ Years old  Completed   Hepatitis C Screening  Completed   HPV VACCINES  Aged Out    Health Maintenance  Health Maintenance Due  Topic Date Due   Zoster Vaccines- Shingrix (1 of 2) Never done   COLONOSCOPY (Pts 45-65yrs Insurance coverage will need to be confirmed)  Never done   MAMMOGRAM  04/10/2013   DEXA SCAN  Never done   COVID-19 Vaccine (3 - Pfizer risk series) 03/01/2020      Mammogram status: Ordered 08/22/2022. Pt provided with contact info and advised to call to schedule appt.    Lung Cancer Screening: (Low Dose CT Chest recommended if Age 36-80 years, 30 pack-year currently smoking OR have quit w/in 15years.) does not qualify.   Lung Cancer Screening Referral: N/A  Additional Screening:  Hepatitis C Screening: does not qualify; Completed 11/09/2020  Vision Screening: Recommended annual ophthalmology exams for early detection of glaucoma and other disorders of the eye. Is the patient up to date with their annual eye exam?  Yes  Who is the provider or what is the name of the office in which the patient attends annual eye exams? [facility] If pt is not established with a provider, would they like to be referred to a provider to establish care? No .   Dental Screening: Recommended annual dental exams for proper oral  hygiene  Community Resource Referral / Chronic Care Management: CRR required this visit?  No   CCM required this visit?  No      Plan:     I have personally reviewed and noted the following in the patient's chart:   Medical and social history Use of alcohol, tobacco or illicit drugs  Current medications and supplements including opioid prescriptions. Patient is currently taking opioid prescriptions. Information provided to patient regarding non-opioid alternatives. Patient advised to discuss non-opioid treatment plan with their provider. Functional ability and status Nutritional status Physical activity Advanced directives List of other physicians  Hospitalizations, surgeries, and ER visits in previous 12 months Vitals Screenings to include cognitive, depression, and falls Referrals and appointments  In addition, I have reviewed and discussed with patient certain preventive protocols, quality metrics, and best practice recommendations. A written personalized care plan for preventive services as well as general preventive health recommendations were provided to patient.     Cala Bradford, [facility]   02/20/2023   Nurse Notes: Face-To-Face visit  Ms. Anne Hahn , Thank you for taking time to come for your Medicare Wellness Visit. I appreciate your ongoing commitment to your health goals. Please review the following plan we discussed and let me know if I can assist you in the future.   These are the goals we discussed:  Goals   None     This is a list of the screening recommended for you and due dates:  Health Maintenance  Topic Date Due   Zoster (Shingles) Vaccine (1 of 2) Never done   Colon Cancer Screening  Never done   Mammogram  04/10/2013   DEXA scan (bone density measurement)  Never done   COVID-19 Vaccine (3 - Pfizer risk series) 03/01/2020   Hemoglobin A1C  05/22/2023   Eye exam for diabetics  05/24/2023   Flu Shot  05/29/2023   Yearly kidney health urinalysis for  diabetes  08/23/2023   Complete foot exam   08/23/2023   Yearly kidney function blood test for diabetes  02/19/2024   Medicare Annual Wellness Visit  02/20/2024   DTaP/Tdap/Td vaccine (2 - Tdap) 09/29/2031   Pneumonia Vaccine  Completed   Hepatitis C Screening: USPSTF Recommendation to screen - Ages 59-79 yo.  Completed   HPV Vaccine  Aged Out

## 2023-02-20 NOTE — Assessment & Plan Note (Signed)
138/57 on current regimen Including losartan 100 mg daily, hydralazine 25 mg twice daily (prescribed 3 times daily which she had not recalled) and diltiazem 120 mg daily with dual indication for A-fib rate control.  She is doing well with no side effects, and renal function and potassium are safe. 02/19/2023: K3.2 (GI losses) , BUN 10, creatinine 0.87, GFR greater than 60.  Goal remains simplification though no changes made today.

## 2023-02-20 NOTE — Progress Notes (Signed)
72 year old Sherri Gill (stage IV adenocarcinoma of the uterus, in remission following surgery and chemotherapy) is here for routine evaluation after a long hiatus, during which she was taking some time away at her mother's home in [place].  While there, she became ill and was seen in a [facility] emergency room where she underwent abdominal pelvic CT; she was notified that cancer was visible and has since returned to [facility] where she has been seen by her oncologist, Dr. [name], and has been started on a new chemotherapy regimen.  Sherri Gill has been feeling quite ill at the end of last week and through the weekend-nausea and vomiting large volumes of dark-colored emesis, which has improved over the past 72 hours (no further emesis) though she continues to have a lot of uncontrolled belching, active and noisy bowel sounds, unable to tolerate only small and liquid meals.  Bowels have been moving regularly throughout this time, either constipation or diarrhea.  Feels a sensation in her upper abdomen and states that the [facility] doctors informed her that she has "cancer at the top of her stomach".  She has been in communication with the cancer office and yesterday had lab draw and received IVF there which helped her feel somewhat better.  She has been prescribed Protonix, Compazine, Zofran, and oral Dilaudid.  She hopes we will offer her a stronger medication to further relieve her symptoms.  Medications reviewed.  She prefers 30-day supplies with refills due to cost considerations.  BP (!) 138/57 (BP Location: Right Arm, Patient Position: Sitting, Cuff Size: Small)   Pulse 68   Temp 97.8 F (36.6 C) (Oral)   Ht  (1.6 m)   Wt 177 lb 4.8 oz (80.4 kg)   SpO2 99%   BMI 31.41 kg/m  Sherri Gill is attractively dressed and groomed and has well though anxious/concerned affect.  Multiple episodes of belching during conversation for which she is apologetic.  Exam on table: Decreased breath sounds  posteriorly with few dependent rales.  Abdomen with generally hypoactive bowel sounds, a few occasions of gushing noted primarily on right abdomen.  There is a firmness and discomfort on palpation in the epigastric to left upper quadrant area.  Minimal tympany on percussion throughout.  Skin warm and dry and smooth in good condition.  Assessment and Plan  Uterine cancer (HCC) Beginning Dostarlimab for recurrent disease.  I'm concerned that her abdominal symptoms are due to this; she may have even had a now-resolved partial SBO (large volume bilious vomiting).  The radiology report from CT in [place] was accessed and identified omental and peritoneal carcinomatosis, mid and distal gastric wall thickening, omental studding of the liver, left kidney obstruction due to left paramedian pelvic mass with hydroureteronephrosis (taken from formal read). I don't believe Sherri Gill has a good understanding of the degree to which her cancer is spread.  I will reach out to her oncologist to further discuss staging and prognosis, and what if anything should be done to address her obstructed L ureter. Meanwhile, will refer her to behavioral health therapist in our clinic.  She is feeling very weak and desires assistance with optimizing physical function while undergoing cancer care (goal of cure per Dr. [name] notes).    Essential hypertension 138/57 on current regimen Including losartan 100 mg daily, hydralazine 25 mg twice daily (prescribed 3 times daily which she had not recalled) and diltiazem 120 mg daily with dual indication for A-fib rate control.  She is doing well with no side  effects, and renal function and potassium are safe. 02/19/2023: K3.2 (GI losses) , BUN 10, creatinine 0.87, GFR greater than 60.  Goal remains simplification though no changes made today.  Type 2 diabetes mellitus with neurological complications (HCC) A1c 6.5 on metformin 1000 mg bid, and victoza (which was stopped by Dr. [name] on  01/31/23, perhaps due to concerns about weight loss - 10 # since last Fall - though this drop could also reflect cancer recurrence). No changes at this time.  RTC 3 months, sooner if needed.  I asked her to notify both our clinic and the oncology clinic with symptom concerns.

## 2023-02-20 NOTE — Assessment & Plan Note (Addendum)
A1c 6.5 on metformin 1000 mg bid, and victoza (which was stopped by Dr. [name] on 01/31/23, perhaps due to concerns about weight loss - 10 # since last Fall - though this drop could also reflect cancer recurrence). No changes at this time.

## 2023-02-27 MED FILL — Dexamethasone Sodium Phosphate Inj 100 MG/10ML: INTRAMUSCULAR | Qty: 1 | Status: AC

## 2023-02-27 MED FILL — Fosaprepitant Dimeglumine For IV Infusion 150 MG (Base Eq): INTRAVENOUS | Qty: 5 | Status: AC

## 2023-02-28 ENCOUNTER — Other Ambulatory Visit: Payer: Medicare HMO

## 2023-02-28 ENCOUNTER — Encounter: Payer: Self-pay | Admitting: Hematology and Oncology

## 2023-02-28 ENCOUNTER — Inpatient Hospital Stay: Payer: Medicare HMO | Attending: Hematology and Oncology

## 2023-02-28 ENCOUNTER — Other Ambulatory Visit (HOSPITAL_COMMUNITY): Payer: Self-pay

## 2023-02-28 ENCOUNTER — Inpatient Hospital Stay: Payer: Medicare HMO | Admitting: Hematology and Oncology

## 2023-02-28 ENCOUNTER — Other Ambulatory Visit: Payer: Self-pay

## 2023-02-28 ENCOUNTER — Inpatient Hospital Stay: Payer: Medicare HMO

## 2023-02-28 VITALS — BP 134/52 | HR 81 | Temp 97.6°F | Resp 18 | Ht 63.0 in | Wt 173.8 lb

## 2023-02-28 VITALS — BP 147/50 | HR 64 | Temp 98.0°F | Resp 18

## 2023-02-28 DIAGNOSIS — Z5111 Encounter for antineoplastic chemotherapy: Secondary | ICD-10-CM | POA: Diagnosis present

## 2023-02-28 DIAGNOSIS — R7989 Other specified abnormal findings of blood chemistry: Secondary | ICD-10-CM | POA: Diagnosis not present

## 2023-02-28 DIAGNOSIS — Z7901 Long term (current) use of anticoagulants: Secondary | ICD-10-CM | POA: Diagnosis not present

## 2023-02-28 DIAGNOSIS — R971 Elevated cancer antigen 125 [CA 125]: Secondary | ICD-10-CM | POA: Diagnosis not present

## 2023-02-28 DIAGNOSIS — T451X5A Adverse effect of antineoplastic and immunosuppressive drugs, initial encounter: Secondary | ICD-10-CM | POA: Insufficient documentation

## 2023-02-28 DIAGNOSIS — Z79899 Other long term (current) drug therapy: Secondary | ICD-10-CM | POA: Diagnosis not present

## 2023-02-28 DIAGNOSIS — Z880 Allergy status to penicillin: Secondary | ICD-10-CM | POA: Insufficient documentation

## 2023-02-28 DIAGNOSIS — E860 Dehydration: Secondary | ICD-10-CM | POA: Diagnosis not present

## 2023-02-28 DIAGNOSIS — Z79630 Long term (current) use of alkylating agent: Secondary | ICD-10-CM | POA: Insufficient documentation

## 2023-02-28 DIAGNOSIS — D6481 Anemia due to antineoplastic chemotherapy: Secondary | ICD-10-CM | POA: Diagnosis not present

## 2023-02-28 DIAGNOSIS — E878 Other disorders of electrolyte and fluid balance, not elsewhere classified: Secondary | ICD-10-CM | POA: Insufficient documentation

## 2023-02-28 DIAGNOSIS — C55 Malignant neoplasm of uterus, part unspecified: Secondary | ICD-10-CM

## 2023-02-28 DIAGNOSIS — Z7952 Long term (current) use of systemic steroids: Secondary | ICD-10-CM | POA: Insufficient documentation

## 2023-02-28 DIAGNOSIS — Z79620 Long term (current) use of immunosuppressive biologic: Secondary | ICD-10-CM | POA: Insufficient documentation

## 2023-02-28 DIAGNOSIS — Z79633 Long term (current) use of mitotic inhibitor: Secondary | ICD-10-CM | POA: Insufficient documentation

## 2023-02-28 DIAGNOSIS — R110 Nausea: Secondary | ICD-10-CM

## 2023-02-28 DIAGNOSIS — Z885 Allergy status to narcotic agent status: Secondary | ICD-10-CM | POA: Diagnosis not present

## 2023-02-28 DIAGNOSIS — Z888 Allergy status to other drugs, medicaments and biological substances status: Secondary | ICD-10-CM | POA: Diagnosis not present

## 2023-02-28 DIAGNOSIS — R630 Anorexia: Secondary | ICD-10-CM

## 2023-02-28 DIAGNOSIS — Z5112 Encounter for antineoplastic immunotherapy: Secondary | ICD-10-CM | POA: Diagnosis present

## 2023-02-28 LAB — CBC WITH DIFFERENTIAL (CANCER CENTER ONLY)
Abs Immature Granulocytes: 0.06 10*3/uL (ref 0.00–0.07)
Basophils Absolute: 0 10*3/uL (ref 0.0–0.1)
Basophils Relative: 0 %
Eosinophils Absolute: 0 10*3/uL (ref 0.0–0.5)
Eosinophils Relative: 0 %
HCT: 28.1 % — ABNORMAL LOW (ref 36.0–46.0)
Hemoglobin: 9 g/dL — ABNORMAL LOW (ref 12.0–15.0)
Immature Granulocytes: 1 %
Lymphocytes Relative: 9 %
Lymphs Abs: 0.7 10*3/uL (ref 0.7–4.0)
MCH: 25.9 pg — ABNORMAL LOW (ref 26.0–34.0)
MCHC: 32 g/dL (ref 30.0–36.0)
MCV: 81 fL (ref 80.0–100.0)
Monocytes Absolute: 0.1 10*3/uL (ref 0.1–1.0)
Monocytes Relative: 1 %
Neutro Abs: 7.3 10*3/uL (ref 1.7–7.7)
Neutrophils Relative %: 89 %
Platelet Count: 312 10*3/uL (ref 150–400)
RBC: 3.47 MIL/uL — ABNORMAL LOW (ref 3.87–5.11)
RDW: 16.9 % — ABNORMAL HIGH (ref 11.5–15.5)
WBC Count: 8.2 10*3/uL (ref 4.0–10.5)
nRBC: 0 % (ref 0.0–0.2)

## 2023-02-28 LAB — CMP (CANCER CENTER ONLY)
ALT: 7 U/L (ref 0–44)
AST: 11 U/L — ABNORMAL LOW (ref 15–41)
Albumin: 4.1 g/dL (ref 3.5–5.0)
Alkaline Phosphatase: 126 U/L (ref 38–126)
Anion gap: 8 (ref 5–15)
BUN: 23 mg/dL (ref 8–23)
CO2: 27 mmol/L (ref 22–32)
Calcium: 9.1 mg/dL (ref 8.9–10.3)
Chloride: 105 mmol/L (ref 98–111)
Creatinine: 1.37 mg/dL — ABNORMAL HIGH (ref 0.44–1.00)
GFR, Estimated: 42 mL/min — ABNORMAL LOW (ref >60)
Glucose, Bld: 158 mg/dL — ABNORMAL HIGH (ref 70–99)
Potassium: 4.1 mmol/L (ref 3.5–5.1)
Sodium: 140 mmol/L (ref 135–145)
Total Bilirubin: 0.3 mg/dL (ref 0.3–1.2)
Total Protein: 7 g/dL (ref 6.5–8.1)

## 2023-02-28 LAB — MAGNESIUM: Magnesium: 1.3 mg/dL — ABNORMAL LOW (ref 1.7–2.4)

## 2023-02-28 MED ORDER — FAMOTIDINE 20 MG IN NS 100 ML IVPB
20.0000 mg | Freq: Once | INTRAVENOUS | Status: AC
  Administered 2023-02-28: 20 mg via INTRAVENOUS
  Filled 2023-02-28: qty 100

## 2023-02-28 MED ORDER — SODIUM CHLORIDE 0.9 % IV SOLN
360.0000 mg | Freq: Once | INTRAVENOUS | Status: AC
  Administered 2023-02-28: 360 mg via INTRAVENOUS
  Filled 2023-02-28: qty 36

## 2023-02-28 MED ORDER — SODIUM CHLORIDE 0.9% FLUSH
10.0000 mL | Freq: Once | INTRAVENOUS | Status: AC
  Administered 2023-02-28: 10 mL

## 2023-02-28 MED ORDER — SODIUM CHLORIDE 0.9 % IV SOLN
500.0000 mg | Freq: Once | INTRAVENOUS | Status: AC
  Administered 2023-02-28: 500 mg via INTRAVENOUS
  Filled 2023-02-28: qty 10

## 2023-02-28 MED ORDER — PALONOSETRON HCL INJECTION 0.25 MG/5ML
0.2500 mg | Freq: Once | INTRAVENOUS | Status: AC
  Administered 2023-02-28: 0.25 mg via INTRAVENOUS
  Filled 2023-02-28: qty 5

## 2023-02-28 MED ORDER — SODIUM CHLORIDE 0.9 % IV SOLN
150.0000 mg | Freq: Once | INTRAVENOUS | Status: AC
  Administered 2023-02-28: 150 mg via INTRAVENOUS
  Filled 2023-02-28: qty 150

## 2023-02-28 MED ORDER — DEXAMETHASONE 4 MG PO TABS
4.0000 mg | ORAL_TABLET | Freq: Every day | ORAL | 6 refills | Status: DC
  Filled 2023-02-28: qty 30, 30d supply, fill #0

## 2023-02-28 MED ORDER — HEPARIN SOD (PORK) LOCK FLUSH 100 UNIT/ML IV SOLN
500.0000 [IU] | Freq: Once | INTRAVENOUS | Status: AC | PRN
  Administered 2023-02-28: 500 [IU]

## 2023-02-28 MED ORDER — ACETAMINOPHEN 325 MG PO TABS: 650.0000 mg | ORAL_TABLET | Freq: Once | ORAL | Status: DC

## 2023-02-28 MED ORDER — SODIUM CHLORIDE 0.9 % IV SOLN
175.0000 mg/m2 | Freq: Once | INTRAVENOUS | Status: AC
  Administered 2023-02-28: 330 mg via INTRAVENOUS
  Filled 2023-02-28: qty 55

## 2023-02-28 MED ORDER — CETIRIZINE HCL 10 MG/ML IV SOLN
10.0000 mg | Freq: Once | INTRAVENOUS | Status: AC
  Administered 2023-02-28: 10 mg via INTRAVENOUS
  Filled 2023-02-28: qty 1

## 2023-02-28 MED ORDER — SODIUM CHLORIDE 0.9 % IV SOLN: Freq: Once | INTRAVENOUS | Status: AC

## 2023-02-28 MED ORDER — MAGNESIUM OXIDE -MG SUPPLEMENT 400 (240 MG) MG PO TABS
400.0000 mg | ORAL_TABLET | Freq: Every day | ORAL | 1 refills | Status: DC
  Filled 2023-02-28: qty 60, 60d supply, fill #0

## 2023-02-28 MED ORDER — MAGNESIUM SULFATE 2 GM/50ML IV SOLN
2.0000 g | Freq: Once | INTRAVENOUS | Status: AC
  Administered 2023-02-28: 2 g via INTRAVENOUS
  Filled 2023-02-28: qty 50

## 2023-02-28 MED ORDER — SODIUM CHLORIDE 0.9% FLUSH
10.0000 mL | INTRAVENOUS | Status: DC | PRN
  Administered 2023-02-28: 10 mL

## 2023-02-28 MED ORDER — SODIUM CHLORIDE 0.9 % IV SOLN
10.0000 mg | Freq: Once | INTRAVENOUS | Status: AC
  Administered 2023-02-28: 10 mg via INTRAVENOUS
  Filled 2023-02-28: qty 10

## 2023-02-28 NOTE — Assessment & Plan Note (Signed)
She had history of intermittent nausea I recommend addition of dexamethasone for few days after chemo for anticipatory nausea or delay nausea

## 2023-02-28 NOTE — Patient Instructions (Signed)
[place] [facility]   Discharge Instructions: Thank you for choosing [facility] to provide your oncology and hematology care.   If you have a lab appointment with the Cancer Center, please go directly to the Cancer Center and check in at the registration area.   Wear comfortable clothing and clothing appropriate for easy access to any Portacath or PICC line.   We strive to give you quality time with your provider. You may need to reschedule your appointment if you arrive late (15 or more minutes).  Arriving late affects you and other patients whose appointments are after yours.  Also, if you miss three or more appointments without notifying the office, you may be dismissed from the clinic at the provider's discretion.      For prescription refill requests, have your pharmacy contact our office and allow 72 hours for refills to be completed.    Today you received the following chemotherapy and/or immunotherapy agents: Dostarlimab (Jemperli), Paclitaxel (Taxol), and Carboplatin       To help prevent nausea and vomiting after your treatment, we encourage you to take your nausea medication as directed.  BELOW ARE SYMPTOMS THAT SHOULD BE REPORTED IMMEDIATELY: *FEVER GREATER THAN 100.4 F (38 C) OR HIGHER *CHILLS OR SWEATING *NAUSEA AND VOMITING THAT IS NOT CONTROLLED WITH YOUR NAUSEA MEDICATION *UNUSUAL SHORTNESS OF BREATH *UNUSUAL BRUISING OR BLEEDING *URINARY PROBLEMS (pain or burning when urinating, or frequent urination) *BOWEL PROBLEMS (unusual diarrhea, constipation, pain near the anus) TENDERNESS IN MOUTH AND THROAT WITH OR WITHOUT PRESENCE OF ULCERS (sore throat, sores in mouth, or a toothache) UNUSUAL RASH, SWELLING OR PAIN  UNUSUAL VAGINAL DISCHARGE OR ITCHING   Items with * indicate a potential emergency and should be followed up as soon as possible or go to the Emergency Department if any problems should occur.  Please show the  CHEMOTHERAPY ALERT CARD or IMMUNOTHERAPY ALERT CARD at check-in to the Emergency Department and triage nurse.  Should you have questions after your visit or need to cancel or reschedule your appointment, please contact [place] [facility]  Dept: [phone]  and follow the prompts.  Office hours are 8:00 a.m. to 4:30 p.m. Monday - Friday. Please note that voicemails left after 4:00 p.m. may not be returned until the following business day.  We are closed weekends and major holidays. You have access to a nurse at all times for urgent questions. Please call the main number to the clinic Dept: [phone] and follow the prompts.   For any non-urgent questions, you may also contact your provider using MyChart. We now offer e-Visits for anyone 29 and older to request care online for non-urgent symptoms. For details visit [email or web address].   Also download the MyChart app! Go to the app store, search "MyChart", open the app, select [place], and log in with your MyChart username and password.

## 2023-02-28 NOTE — Assessment & Plan Note (Signed)
This is stable She is not symptomatic Observe only 

## 2023-02-28 NOTE — Assessment & Plan Note (Signed)
She has a lot of GI issues that may not be related to side effects of treatment She has significant electrolyte imbalance with hypomagnesemia and slight elevated creatinine I will adjust the dose of her treatment due to recent weight loss I recommend dexamethasone daily for 2 days next week to see if we can control her nausea and improve her appetite We discussed risk and benefits of delaying treatment versus proceeding with chemotherapy today and she would like to proceed with treatment

## 2023-02-28 NOTE — Progress Notes (Signed)
Verified a dose decrease with Dr. [name] with change in pts scr.

## 2023-02-28 NOTE — Assessment & Plan Note (Signed)
This has been a recurrent issue It could be related to recent vomiting I will order IV magnesium and oral replacement therapy

## 2023-02-28 NOTE — Progress Notes (Signed)
[facility] OFFICE PROGRESS NOTE  Patient Care Team: [name], MD as PCP - General (Internal Medicine) Rennis Golden Lisette Abu, MD as PCP - Cardiology (Cardiology) Jodelle Gross, RN as [facility] Care Management  ASSESSMENT & PLAN:  Uterine cancer [facility]) She has a lot of GI issues that may not be related to side effects of treatment She has significant electrolyte imbalance with hypomagnesemia and slight elevated creatinine I will adjust the dose of her treatment due to recent weight loss I recommend dexamethasone daily for 2 days next week to see if we can control her nausea and improve her appetite We discussed risk and benefits of delaying treatment versus proceeding with chemotherapy today and she would like to proceed with treatment  Anemia due to antineoplastic chemotherapy This is stable She is not symptomatic Observe only  Hypomagnesemia This has been a recurrent issue It could be related to recent vomiting I will order IV magnesium and oral replacement therapy  Chronic nausea She had history of intermittent nausea I recommend addition of dexamethasone for few days after chemo for anticipatory nausea or delay nausea  Elevated serum creatinine Creatinine is elevated likely due to dehydration We will reduce the dose of chemotherapy accordingly  No orders of the defined types were placed in this encounter.   All questions were answered. The patient knows to call the clinic with any problems, questions or concerns. The total time spent in the appointment was 40 minutes encounter with patients including review of chart and various tests results, discussions about plan of care and coordination of care plan   Artis Delay, MD 02/28/2023 10:27 AM  INTERVAL HISTORY: Please see below for problem oriented charting. she returns for treatment follow-up seen prior to cycle 2 of treatment She tolerated last cycle poorly and had been intermittently  dehydrated due to nausea and occasional vomiting She denies constipation She has lost a bit of weight Denies peripheral neuropathy Her blood sugar control is stable  REVIEW OF SYSTEMS:   Constitutional: Denies fevers, chills or abnormal weight loss Eyes: Denies blurriness of vision Ears, nose, mouth, throat, and face: Denies mucositis or sore throat Respiratory: Denies cough, dyspnea or wheezes Cardiovascular: Denies palpitation, chest discomfort or lower extremity swelling Skin: Denies abnormal skin rashes Lymphatics: Denies new lymphadenopathy or easy bruising Neurological:Denies numbness, tingling or new weaknesses Behavioral/Psych: Mood is stable, no new changes  All other systems were reviewed with the patient and are negative.  I have reviewed the past medical history, past surgical history, social history and family history with the patient and they are unchanged from previous note.  ALLERGIES:  is allergic to codeine sulfate, penicillins, percocet [oxycodone-acetaminophen], and zestril [lisinopril].  MEDICATIONS:  Current Outpatient Medications  Medication Sig Dispense Refill   acetaminophen (TYLENOL) 500 MG tablet Take 1,000 mg by mouth every 6 (six) hours as needed for mild pain.     albuterol (VENTOLIN HFA) 108 (90 Base) MCG/ACT inhaler Inhale 2 puffs into the lungs every 4 (four) hours as needed for wheezing or shortness of breath. 8 g 0   atorvastatin (LIPITOR) 20 MG tablet Take 1 tablet (20 mg total) by mouth at bedtime. 30 tablet 11   B Complex-C (B-COMPLEX WITH VITAMIN C) tablet Take 1 tablet by mouth in the morning.     Blood Glucose Monitoring Suppl (ONE TOUCH ULTRA MINI) w/Device KIT Please use as directed. 1 each 0   Cholecalciferol 1000 units capsule Take 1 capsule (1,000 Units total) by mouth  daily. 90 capsule 1   dexamethasone (DECADRON) 4 MG tablet Take 1 tablet (4 mg total) by mouth daily. 30 tablet 6   diltiazem (CARDIZEM CD) 120 MG 24 hr capsule Take 1  capsule (120 mg total) by mouth daily. 30 capsule 11   dorzolamide-timolol (COSOPT) 22.3-6.8 MG/ML ophthalmic solution Place 1 drop into both eyes 2 (two) times daily.     glucose blood (ONE TOUCH ULTRA TEST) test strip Use as instructed 100 each 1   hydrALAZINE (APRESOLINE) 25 MG tablet TAKE 1 TABLET BY MOUTH THREE TIMES DAILY 90 tablet 3   HYDROmorphone (DILAUDID) 2 MG tablet Take 1 tablet (2 mg) by mouth 3 times daily as needed for severe pain. 30 tablet 0   Insulin Pen Needle 32G X 4 MM MISC 1 each by Does not apply route daily. 100 each 3   latanoprost (XALATAN) 0.005 % ophthalmic solution Place 1 drop into both eyes at bedtime.     lidocaine-prilocaine (EMLA) cream Apply to affected area once as directed 30 g 3   losartan (COZAAR) 100 MG tablet Take 1 tablet (100 mg total) by mouth at bedtime. 90 tablet 3   magnesium oxide (MAG-OX) 400 (240 Mg) MG tablet Take 1 tablet (400 mg total) by mouth daily. 60 tablet 1   metFORMIN (GLUCOPHAGE) 1000 MG tablet Take 1 tablet (1,000 mg total) by mouth 2 (two) times daily. 180 tablet 3   ondansetron (ZOFRAN) 8 MG tablet Take 1 tablet (8 mg total) by mouth every 8 (eight) hours as needed for nausea or vomiting. Start on the third day after chemotherapy. 30 tablet 1   ONETOUCH DELICA LANCETS 33G MISC Please use as directed. 100 each 0   pantoprazole (PROTONIX) 40 MG tablet Take 1 tablet by mouth daily. 360 tablet 0   polyethylene glycol powder (GLYCOLAX/MIRALAX) 17 GM/SCOOP powder Take 17 g by mouth daily. 238 g 1   prochlorperazine (COMPAZINE) 10 MG tablet Take 1 tablet (10 mg total) by mouth every 6 (six) hours as needed for nausea or vomiting. 30 tablet 1   rivaroxaban (XARELTO) 20 MG TABS tablet Take 1 tablet (20 mg total) by mouth daily with supper. 90 tablet 1   No current facility-administered medications for this visit.   Facility-Administered Medications Ordered in Other Visits  Medication Dose Route Frequency Provider Last Rate Last Admin    CARBOplatin (PARAPLATIN) 360 mg in sodium chloride 0.9 % 100 mL chemo infusion  360 mg Intravenous Once [name], Dellar Traber, MD       dexamethasone (DECADRON) 10 mg in sodium chloride 0.9 % 50 mL IVPB  10 mg Intravenous Once [name], Sirus Labrie, MD       dostarlimab-gxly (JEMPERLI) 500 mg in sodium chloride 0.9 % 100 mL (4.5455 mg/mL) chemo infusion  500 mg Intravenous Once [name], Jonah Gingras, MD       famotidine (PEPCID) IVPB 20 mg in NS 100 mL IVPB  20 mg Intravenous Once [name], Jule Schlabach, MD 400 mL/hr at 02/28/23 1016 20 mg at 02/28/23 1016   fosaprepitant (EMEND) 150 mg in sodium chloride 0.9 % 145 mL IVPB  150 mg Intravenous Once [name], Imonie Tuch, MD       heparin lock flush 100 unit/mL  500 Units Intracatheter Once PRN [name], Ephrata Verville, MD       magnesium sulfate IVPB 2 g 50 mL  2 g Intravenous Once [name], Jakiera Ehler, MD       PACLitaxel (TAXOL) 330 mg in sodium chloride 0.9 % 500 mL chemo infusion (> 80mg /m2)  175 mg/m2 (Treatment Plan Recorded) Intravenous Once Rane Blitch, MD       sodium chloride flush (NS) 0.9 % injection 10 mL  10 mL Intracatheter PRN [name], Layden Caterino, MD        SUMMARY OF ONCOLOGIC HISTORY: Oncology History Overview Note  High grade serous, 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 Heterogeneous endometrial thickening with appearance of moderate complex fluid in the endometrial canal, possible hemorrhagic material. Endometrial thickness is considered abnormal for an asymptomatic post-menopausal female. Endometrial sampling should be considered to exclude carcinoma. 2. Nonvisualized ovary 3. Heterogeneous thick-walled urinary bladder, correlate for cystitis.   12/28/2021 Pathology Results   CYTOLOGY - NON PAP  CASE: [id number]  PATIENT: Sherri Gill  Non-Gynecological Cytology Report   Clinical History: Abnormal pelvic US, highly suspicious of malignant ascites  Specimen Submitted:  A. ASCITES, PARACENTESIS:    FINAL MICROSCOPIC DIAGNOSIS:  -  Malignant cells consistent with adenocarcinoma  - See comment   SPECIMEN ADEQUACY:  Satisfactory for evaluation   DIAGNOSTIC COMMENTS:  Immunohistochemical stains show that the tumor cells are positive for CK7 and PAX8 while they are negative for CK20, CDX2 and ER, consistent with above interpretation.  Additionally, the tumor cells are diffusely positive for p53 (clonal overexpression), suggestive of a high-grade serous carcinoma.    12/28/2021 Imaging   US pelvis 1. Persistent abnormal appearance of the endometrium, heterogeneously thickened and complex with possible intraluminal fluid. This is similar to that seen on December 2022 ultrasound. Endometrial thickness is considered abnormal for an asymptomatic post-menopausal female. Endometrial sampling should be considered to exclude carcinoma. 2. Nonvisualization of the ovaries.   12/28/2021 Imaging   1. Large volume ascites. 2. Small umbilical hernia containing fat and ascites. 3. Haziness of anterior omentum most likely related to ascites, other etiologies such as metastatic disease can not be excluded. 4. Diffuse colonic diverticulosis without evidence for diverticulitis.   12/29/2021 Procedure   Successful ultrasound-guided paracentesis yielding 2.8 liters of peritoneal fluid.   01/14/2022 Initial Diagnosis   Uterine cancer (HCC)   01/14/2022 Cancer Staging   Staging form: Corpus Uteri - Carcinoma and Carcinosarcoma, AJCC 8th Edition - Clinical: Stage III (cT3, cN0, cM0) - Signed by Artis Delay, MD on 01/14/2022 Stage prefix: Initial diagnosis   01/15/2022 Surgery   Surgery: Cervical dilation under ultrasound guidance, hysteroscopy, endometrial sampling using the Myosure   Surgeons:  Carin Hock MD    Pathology: endometrial curetteings   Operative findings: On EUA, 8 cm mobile uterus, some nodularity along cul de sac. Cervix normal in appearance without discernible external os. Difficulty noted in dilating the cervix after using  scalpel to incise in area of what what thought to be the external os. Under ultrasound guidance, cervix was dilated with confirmation on imaging of intra-uterine placement of dilators. On hysteroscopy, abnormal appearing endocervix and very calcified tissue obscuring good visualization of endometrium concerning for replacement by tumor.    01/15/2022 Pathology Results   FINAL MICROSCOPIC DIAGNOSIS:   A.   ENDOMETRIUM, CURETTAGE:  -    Serous endometrial carcinoma.   COMMENT:   The carcinoma is strongly and diffusely positive for p53 and diffuse p16 positivity.  The ER shows relatively diffuse positivity.  Overall, the histologic features (slit-like glandular spaces and psammoma bodies) and the immunohistochemical phenotype are most characteristic for a serous carcinoma.    01/21/2022 Procedure   Placement of single lumen port a cath via right internal  jugular vein. The catheter tip lies at the cavo-atrial junction. A power injectable port a cath was placed and is ready for immediate use.     01/25/2022 - 06/21/2022 Chemotherapy   Patient is on Treatment Plan : UTERINE Carboplatin AUC 6 / Paclitaxel q21d     03/21/2022 Imaging   1. Near complete resolution of previously noted ascites, with only small volume residual perihepatic ascites. 2. Improved omental caking and stranding in the ventral abdomen. 3. Findings are consistent with treatment response of peritoneal metastatic disease. 4. Thickening of the urinary bladder wall , consistent with nonspecific infectious or inflammatory cystitis. Correlate with urinalysis.   Aortic Atherosclerosis (ICD10-I70.0).       03/22/2022 Tumor Marker   Patient's tumor was tested for the following markers: CA-125. Results of the tumor marker test revealed 21.5.   04/09/2022 Surgery     Surgery: Diagnostic laparoscopy, conversion to exploratory laparotomy, partial omentectomy, lysis of adhesions for approximately 60 minutes, total abdominal hysterectomy,  bilateral salpingo-oophorectomy, excision of ileal tumor implant and excision of transverse colon epiploica tumor implant   04/09/2022 Pathology Results   A.   OMENTUM, RESECTION:  -    Metastatic serous carcinoma.   B.   ILEAL TUMOR IMPLANTS, EXCISION:  -    Adhesions with small focus of metastatic serous carcinoma, possibly  intralymphatic.   C.   UTERUS, CERVIX, BILATERAL FALLOPIAN TUBES AND OVARIES:  -    Serous carcinoma, endometrial, invasive into outer half of uterus,  with involvement of  cervical stroma, uterine serosa, ovaries and fallopian tubes.   -    Negative for cervical dysplasia.  -    Leiomyomas, largest 1.2 cm in greatest dimension.   D.   COLON, MESENTARY NODULE, TRANSVERSE, EXCISION:  -    Metastatic serous carcinoma   05/09/2022 Tumor Marker   Patient's tumor was tested for the following markers: CA-125. Results of the tumor marker test revealed 12.1.   05/29/2022 Tumor Marker   Patient's tumor was tested for the following markers: CA-125. Results of the tumor marker test revealed 9.1.   07/23/2022 Imaging   1. No abdominopelvic lymphadenopathy.  2. Scattered foci of fat stranding in the anterior pelvic peritoneal fat without discrete solid implants. Mild smooth wall thickening of the pelvic peritoneum. These findings are nonspecific and could represent postsurgical/post treatment change, although residual peritoneal carcinomatosis is on the differential and close follow-up CT is suggested. 3. Moderate left colonic diverticulosis. 4. Aortic Atherosclerosis (ICD10-I70.0).   07/23/2022 Tumor Marker   Patient's tumor was tested for the following markers: CA-125. Results of the tumor marker test revealed 5.2.   09/09/2022 Tumor Marker   Patient's tumor was tested for the following markers: CA-125. Results of the tumor marker test revealed 10.1.   02/03/2023 Tumor Marker   Patient's tumor was tested for the following markers: CA-125. Results of the tumor marker  test revealed 429.   02/07/2023 -  Chemotherapy   Patient is on Treatment Plan : UTERINE ENDOMETRIAL Dostarlimab-gxly (500 mg) + Carboplatin (AUC 5) + Paclitaxel (175 mg/m2) q21d x 6 cycles / Dostarlimab-gxly (1000 mg) q42d x 6 cycles        PHYSICAL EXAMINATION: ECOG PERFORMANCE STATUS: 1 - Symptomatic but completely ambulatory  Vitals:   02/28/23 0925  BP: (!) 134/52  Pulse: 81  Resp: 18  Temp: 97.6 F (36.4 C)  SpO2: 97%   Filed Weights   02/28/23 0925  Weight: 173 lb 12.8 oz (78.8 kg)    GENERAL:alert,  no distress and comfortable  NEURO: alert & oriented x 3 with fluent speech, no focal motor/sensory deficits  LABORATORY DATA:  I have reviewed the data as listed    Component Value Date/Time   NA 140 02/28/2023 0823   NA 139 01/10/2022 1644   K 4.1 02/28/2023 0823   CL 105 02/28/2023 0823   CO2 27 02/28/2023 0823   GLUCOSE 158 (H) 02/28/2023 0823   GLUCOSE 93 09/10/2006 0834   BUN 23 02/28/2023 0823   BUN 13 01/10/2022 1644   CREATININE 1.37 (H) 02/28/2023 0823   CREATININE 0.64 07/13/2014 1621   CALCIUM 9.1 02/28/2023 0823   CALCIUM 9.7 03/29/2010 1554   PROT 7.0 02/28/2023 0823   PROT 7.6 11/09/2020 1156   ALBUMIN 4.1 02/28/2023 0823   ALBUMIN 4.4 11/09/2020 1156   AST 11 (L) 02/28/2023 0823   ALT 7 02/28/2023 0823   ALKPHOS 126 02/28/2023 0823   BILITOT 0.3 02/28/2023 0823   GFRNONAA 42 (L) 02/28/2023 0823   GFRNONAA >89 07/13/2014 1621   GFRAA 86 11/09/2020 1156   GFRAA >89 07/13/2014 1621    No results found for: "SPEP", "UPEP"  Lab Results  Component Value Date   WBC 8.2 02/28/2023   NEUTROABS 7.3 02/28/2023   HGB 9.0 (L) 02/28/2023   HCT 28.1 (L) 02/28/2023   MCV 81.0 02/28/2023   PLT 312 02/28/2023      Chemistry      Component Value Date/Time   NA 140 02/28/2023 0823   NA 139 01/10/2022 1644   K 4.1 02/28/2023 0823   CL 105 02/28/2023 0823   CO2 27 02/28/2023 0823   BUN 23 02/28/2023 0823   BUN 13 01/10/2022 1644   CREATININE  1.37 (H) 02/28/2023 0823   CREATININE 0.64 07/13/2014 1621      Component Value Date/Time   CALCIUM 9.1 02/28/2023 0823   CALCIUM 9.7 03/29/2010 1554   ALKPHOS 126 02/28/2023 0823   AST 11 (L) 02/28/2023 0823   ALT 7 02/28/2023 0823   BILITOT 0.3 02/28/2023 1610

## 2023-02-28 NOTE — Progress Notes (Signed)
Patient c/o of stomach cramping and discomfort with magnesium infusion.  Per Dr. Mosetta Putt - okay to hold remainder of the magnesium infusion. Approximately 2 ml remained of magnesium infusion.  Patient to receive 650 mg tylenol PO to help with discomfort.  Patient has prescribed magnesium supplement to take at home. Patient discharged in stable condition. Wheelchair assist to the lobby.

## 2023-02-28 NOTE — Assessment & Plan Note (Signed)
Creatinine is elevated likely due to dehydration We will reduce the dose of chemotherapy accordingly

## 2023-03-01 ENCOUNTER — Other Ambulatory Visit (HOSPITAL_COMMUNITY): Payer: Self-pay

## 2023-03-01 ENCOUNTER — Other Ambulatory Visit: Payer: Self-pay

## 2023-03-01 LAB — CA 125: Cancer Antigen (CA) 125: 176 U/mL — ABNORMAL HIGH (ref 0.0–38.1)

## 2023-03-03 ENCOUNTER — Telehealth: Payer: Self-pay | Admitting: Hematology and Oncology

## 2023-03-04 ENCOUNTER — Telehealth: Payer: Self-pay

## 2023-03-04 ENCOUNTER — Other Ambulatory Visit: Payer: Self-pay

## 2023-03-04 ENCOUNTER — Inpatient Hospital Stay: Payer: Medicare HMO

## 2023-03-04 VITALS — BP 134/61 | HR 61 | Temp 98.8°F | Resp 22

## 2023-03-04 DIAGNOSIS — Z885 Allergy status to narcotic agent status: Secondary | ICD-10-CM | POA: Diagnosis not present

## 2023-03-04 DIAGNOSIS — D6481 Anemia due to antineoplastic chemotherapy: Secondary | ICD-10-CM | POA: Diagnosis not present

## 2023-03-04 DIAGNOSIS — T451X5A Adverse effect of antineoplastic and immunosuppressive drugs, initial encounter: Secondary | ICD-10-CM | POA: Diagnosis not present

## 2023-03-04 DIAGNOSIS — Z5112 Encounter for antineoplastic immunotherapy: Secondary | ICD-10-CM | POA: Diagnosis not present

## 2023-03-04 DIAGNOSIS — Z7952 Long term (current) use of systemic steroids: Secondary | ICD-10-CM | POA: Diagnosis not present

## 2023-03-04 DIAGNOSIS — Z79630 Long term (current) use of alkylating agent: Secondary | ICD-10-CM | POA: Diagnosis not present

## 2023-03-04 DIAGNOSIS — Z79620 Long term (current) use of immunosuppressive biologic: Secondary | ICD-10-CM | POA: Diagnosis not present

## 2023-03-04 DIAGNOSIS — R7989 Other specified abnormal findings of blood chemistry: Secondary | ICD-10-CM | POA: Diagnosis not present

## 2023-03-04 DIAGNOSIS — Z7901 Long term (current) use of anticoagulants: Secondary | ICD-10-CM | POA: Diagnosis not present

## 2023-03-04 DIAGNOSIS — Z888 Allergy status to other drugs, medicaments and biological substances status: Secondary | ICD-10-CM | POA: Diagnosis not present

## 2023-03-04 DIAGNOSIS — R971 Elevated cancer antigen 125 [CA 125]: Secondary | ICD-10-CM | POA: Diagnosis not present

## 2023-03-04 DIAGNOSIS — Z5111 Encounter for antineoplastic chemotherapy: Secondary | ICD-10-CM | POA: Diagnosis not present

## 2023-03-04 DIAGNOSIS — Z79633 Long term (current) use of mitotic inhibitor: Secondary | ICD-10-CM | POA: Diagnosis not present

## 2023-03-04 DIAGNOSIS — Z880 Allergy status to penicillin: Secondary | ICD-10-CM | POA: Diagnosis not present

## 2023-03-04 DIAGNOSIS — C55 Malignant neoplasm of uterus, part unspecified: Secondary | ICD-10-CM

## 2023-03-04 DIAGNOSIS — R110 Nausea: Secondary | ICD-10-CM | POA: Diagnosis not present

## 2023-03-04 DIAGNOSIS — E860 Dehydration: Secondary | ICD-10-CM | POA: Diagnosis not present

## 2023-03-04 DIAGNOSIS — Z79899 Other long term (current) drug therapy: Secondary | ICD-10-CM | POA: Diagnosis not present

## 2023-03-04 DIAGNOSIS — E878 Other disorders of electrolyte and fluid balance, not elsewhere classified: Secondary | ICD-10-CM | POA: Diagnosis not present

## 2023-03-04 MED ORDER — SODIUM CHLORIDE 0.9% FLUSH
10.0000 mL | Freq: Once | INTRAVENOUS | Status: AC
  Administered 2023-03-04: 10 mL

## 2023-03-04 MED ORDER — HEPARIN SOD (PORK) LOCK FLUSH 100 UNIT/ML IV SOLN
500.0000 [IU] | Freq: Once | INTRAVENOUS | Status: AC
  Administered 2023-03-04: 500 [IU]

## 2023-03-04 MED ORDER — SODIUM CHLORIDE 0.9 % IV SOLN: INTRAVENOUS | Status: AC

## 2023-03-04 NOTE — Patient Instructions (Signed)

## 2023-03-04 NOTE — Telephone Encounter (Signed)
Called and given below message. She verbalized understanding. Denies vomiting. Complaining of little nausea at times. Took Zofran this am. She will continue Dexamethasone 1/2 tablet for few more days.  She would like IV fluids today and given appt for 2 pm this afternoon. She is aware of appt at 2 pm today.  She is having a lot of leg pain since treatment. She took advil yesterday for the pain. Today she took the Dilaudid and her leg pain is starting to get better.

## 2023-03-04 NOTE — Telephone Encounter (Signed)
-----   Message from Artis Delay, MD sent at 03/04/2023  8:49 AM EDT ----- Give her good news first..cancer-125 is better Can you call and ask if she still have any nausea/vomiting? She was instructed to take dexamethasone over the weekend  If no nausea, we can stop dex If still nausea, offer her to come in for IVF if needed and continue low dose dex 1/2 tab for a few more days

## 2023-03-06 ENCOUNTER — Other Ambulatory Visit: Payer: Self-pay

## 2023-03-06 ENCOUNTER — Telehealth: Payer: Self-pay

## 2023-03-06 DIAGNOSIS — C55 Malignant neoplasm of uterus, part unspecified: Secondary | ICD-10-CM

## 2023-03-06 NOTE — Telephone Encounter (Addendum)
Returned her call. She started having diarrhea at 4 am today, so far she has had 5 episodes. She just took imodium and will keep taking until diarrhea stops. She has stopped all laxatives. She had nausea this morning and took Zofran. Nausea is better. She is able to eat/ drink. She will push oral fluids today. Offered IV fluids today and she declined appt, she feels too weak and just wants to lay in bed. She would like IV fluids tomorrow. Told her I would try to schedule appt for tomorrow for IV fluids and call her back.

## 2023-03-06 NOTE — Telephone Encounter (Signed)
Called her back and given appt for IV fluids for tomorrow at 2 pm, she is aware of appt time and will call the office back for questions.

## 2023-03-07 ENCOUNTER — Inpatient Hospital Stay: Payer: Medicare HMO

## 2023-03-07 ENCOUNTER — Other Ambulatory Visit: Payer: Self-pay

## 2023-03-07 VITALS — BP 144/60 | HR 71 | Temp 98.6°F | Resp 18 | Wt 176.0 lb

## 2023-03-07 DIAGNOSIS — E878 Other disorders of electrolyte and fluid balance, not elsewhere classified: Secondary | ICD-10-CM | POA: Diagnosis not present

## 2023-03-07 DIAGNOSIS — C55 Malignant neoplasm of uterus, part unspecified: Secondary | ICD-10-CM | POA: Diagnosis not present

## 2023-03-07 DIAGNOSIS — Z79620 Long term (current) use of immunosuppressive biologic: Secondary | ICD-10-CM | POA: Diagnosis not present

## 2023-03-07 DIAGNOSIS — D6481 Anemia due to antineoplastic chemotherapy: Secondary | ICD-10-CM | POA: Diagnosis not present

## 2023-03-07 DIAGNOSIS — R971 Elevated cancer antigen 125 [CA 125]: Secondary | ICD-10-CM | POA: Diagnosis not present

## 2023-03-07 DIAGNOSIS — Z7901 Long term (current) use of anticoagulants: Secondary | ICD-10-CM | POA: Diagnosis not present

## 2023-03-07 DIAGNOSIS — R7989 Other specified abnormal findings of blood chemistry: Secondary | ICD-10-CM | POA: Diagnosis not present

## 2023-03-07 DIAGNOSIS — Z5111 Encounter for antineoplastic chemotherapy: Secondary | ICD-10-CM | POA: Diagnosis not present

## 2023-03-07 DIAGNOSIS — T451X5A Adverse effect of antineoplastic and immunosuppressive drugs, initial encounter: Secondary | ICD-10-CM | POA: Diagnosis not present

## 2023-03-07 DIAGNOSIS — E860 Dehydration: Secondary | ICD-10-CM | POA: Diagnosis not present

## 2023-03-07 DIAGNOSIS — Z7952 Long term (current) use of systemic steroids: Secondary | ICD-10-CM | POA: Diagnosis not present

## 2023-03-07 DIAGNOSIS — Z880 Allergy status to penicillin: Secondary | ICD-10-CM | POA: Diagnosis not present

## 2023-03-07 DIAGNOSIS — Z888 Allergy status to other drugs, medicaments and biological substances status: Secondary | ICD-10-CM | POA: Diagnosis not present

## 2023-03-07 DIAGNOSIS — Z5112 Encounter for antineoplastic immunotherapy: Secondary | ICD-10-CM | POA: Diagnosis not present

## 2023-03-07 DIAGNOSIS — Z79899 Other long term (current) drug therapy: Secondary | ICD-10-CM | POA: Diagnosis not present

## 2023-03-07 DIAGNOSIS — R110 Nausea: Secondary | ICD-10-CM | POA: Diagnosis not present

## 2023-03-07 DIAGNOSIS — Z885 Allergy status to narcotic agent status: Secondary | ICD-10-CM | POA: Diagnosis not present

## 2023-03-07 DIAGNOSIS — Z79633 Long term (current) use of mitotic inhibitor: Secondary | ICD-10-CM | POA: Diagnosis not present

## 2023-03-07 DIAGNOSIS — Z79630 Long term (current) use of alkylating agent: Secondary | ICD-10-CM | POA: Diagnosis not present

## 2023-03-07 MED ORDER — SODIUM CHLORIDE 0.9 % IV SOLN: INTRAVENOUS | Status: AC

## 2023-03-07 MED ORDER — HEPARIN SOD (PORK) LOCK FLUSH 100 UNIT/ML IV SOLN: 500.0000 [IU] | Freq: Once | INTRAVENOUS | Status: DC

## 2023-03-07 MED ORDER — SODIUM CHLORIDE 0.9% FLUSH: 10.0000 mL | Freq: Once | INTRAVENOUS | Status: DC

## 2023-03-07 NOTE — Patient Instructions (Signed)
Diarrhea, Adult Diarrhea is when you pass loose and sometimes watery poop (stool) often. Diarrhea can make you feel weak and cause you to lose water in your body (get dehydrated). Losing water in your body can cause you to: Feel tired and thirsty. Have a dry mouth. Go pee (urinate) less often. Diarrhea often lasts 2-3 days. It can last longer if it is a sign of something more serious. Be sure to treat your diarrhea as told by your doctor. Follow these instructions at home: Eating and drinking     Follow these instructions as told by your doctor: Take an ORS (oral rehydration solution). This is a drink that helps you replace fluids and minerals your body lost. It is sold at pharmacies and stores. Drink enough fluid to keep your pee (urine) pale yellow. Drink fluids such as: Water. You can also get fluids by sucking on ice chips. Diluted fruit juice. Low-calorie sports drinks. Milk. Avoid drinking fluids that have a lot of sugar or caffeine in them. These include soda, energy drinks, and regular sports drinks. Avoid alcohol. Eat bland, easy-to-digest foods in small amounts as you are able. These foods include: Bananas. Applesauce. Rice. Low-fat (lean) meats. Toast. Crackers. Avoid spicy or fatty foods.  Medicines Take over-the-counter and prescription medicines only as told by your doctor. If you were prescribed antibiotics, take them as told by your doctor. Do not stop taking them even if you start to feel better. General instructions  Wash your hands often using soap and water for 20 seconds. If soap and water are not available, use hand sanitizer. Others in your home should wash their hands as well. Wash your hands: After using the toilet or changing a diaper. Before preparing, cooking, or serving food. While caring for a sick person. While visiting someone in a hospital. Rest at home while you get better. Take a warm bath to help with any burning or pain from having  diarrhea. Watch your condition for any changes. Contact a doctor if: You have a fever. Your diarrhea gets worse. You have new symptoms. You vomit every time you eat or drink. You feel light-headed, dizzy, or you have a headache. You have muscle cramps. You have signs of losing too much water in your body, such as: Dark pee, very little pee, or no pee. Cracked lips. Dry mouth. Sunken eyes. Sleepiness. Weakness. You have bloody or black poop or poop that looks like tar. You have very bad pain, cramping, or bloating in your belly (abdomen). Your skin feels cold and clammy. You feel confused. Get help right away if: You have chest pain. Your heart is beating very quickly. You have trouble breathing or you are breathing very quickly. You feel very weak or you faint. These symptoms may be an emergency. Get help right away. Call 911. Do not wait to see if the symptoms will go away. Do not drive yourself to the hospital. This information is not intended to replace advice given to you by your health care provider. Make sure you discuss any questions you have with your health care provider. Document Revised: 04/02/2022 Document Reviewed: 04/02/2022 Elsevier Patient Education  2023 [employer]vier Inc.  

## 2023-03-10 ENCOUNTER — Inpatient Hospital Stay: Payer: Medicare HMO

## 2023-03-10 NOTE — Progress Notes (Signed)
Nutrition Assessment   Reason for Assessment:  Referral from Dr [name] nausea, poor appetite    ASSESSMENT:   72 year old female with recurrent uterine cancer.  Past medical history of GERD, DM, depression, Fe Deficiency anemia.  Patient receiving paraplatin, taxol and jemperti.  Spoke with patient via phone.  Reports that appetite is poor typically for about the first week of treatment and then improves.  States that nausea and diarrhea is better.  Able to tolerate eggs, cream of wheat, oatmeal, bagel, peanut butter, english muffin, tea.  Has tried glucerna shake and another protein shake.  Likes yogurt.     Medications: Vit D, protonix, decadron, zofran, compazine, b complex, metformin   Labs: glucose 158, creatinine 1.37, Mag 1.3   Anthropometrics:   Height: 63 inches Weight: 176 lb on 5/10 188 lb on 09/06/22 BMI: 31  6% weight loss in the last 6 months   Estimated Energy Needs  Kcals: 1700-2000 Protein: 85-100 g Fluid: 1700-2000 ml   NUTRITION DIAGNOSIS: Inadequate oral intake related to cancer related treatment side effects as evidenced by 6% weight loss in the last 6 months and poor appetite with nausea and diarrhea    INTERVENTION:  Encouraged small mini meals q 2 hours Encouraged good sources of protein at meal time. Discussed foods high in protein Will mail handout on foods to choose with nausea and vomiting Continue glucerna shakes, daily if able for added calories and protein. Coupons mailed Continue antiemetics and antidiarrheal medications Contact information mailed   MONITORING, EVALUATION, GOAL: weight trends, intake   Next Visit: Friday, May 24 during infusion  Almena Hokenson B. Freida Busman, RD, LDN Registered Dietitian [phone]

## 2023-03-12 ENCOUNTER — Telehealth: Payer: Self-pay | Admitting: *Deleted

## 2023-03-12 NOTE — Telephone Encounter (Signed)
Call from Darnelle with [facility] - stated she wants to inform pt's doctor she had contacted pt to schedule a date for a visit but was told by pt she wanted to wait. And pt stated she will call their when she's ready.  Thanks

## 2023-03-18 ENCOUNTER — Ambulatory Visit: Payer: Medicare HMO | Admitting: Licensed Clinical Social Worker

## 2023-03-18 DIAGNOSIS — F325 Major depressive disorder, single episode, in full remission: Secondary | ICD-10-CM

## 2023-03-18 NOTE — BH Specialist Note (Signed)
Integrated Behavioral Health via Telemedicine Visit  03/18/2023 Sherri Gill [id number]  Number of Integrated Behavioral Health Clinician visits: 1- Initial Visit  Session Start time: 1530   Session End time: 1630  Total time in minutes: 60   Referring Provider: Acquanetta Sit MD Patient/Family location: Home  [facility] Provider location: Office   I connected with Sherri Gill  Telephone or Video Enabled Telemedicine Application  (Video is Caregility application) and verified that I am speaking with the correct person using two identifiers. Discussed confidentiality: Yes   I discussed the limitations of telemedicine and the availability of in person appointments.  Discussed there is a possibility of technology failure and discussed alternative modes of communication if that failure occurs.  I discussed that engaging in this telemedicine visit, they consent to the provision of behavioral healthcare .  Patient and/or legal guardian expressed understanding and consented to Telemedicine visit: Yes    Goals Addressed: Patient will:  Reduce symptoms of: anxiety and depression   Progress towards Goals: Ongoing  Interventions: Interventions utilized:  Motivational Interviewing, Mindfulness or Relaxation Training, and Supportive Counseling Standardized Assessments completed: PHQ-SADS    03/25/2023    2:28 PM 02/20/2023    1:42 PM 02/20/2023   12:14 PM  PHQ-SADS Last 3 Score only  PHQ Adolescent Score 7 0 0    Assessment: [facility] introduced self on visit today. [facility] provided contact information to patient.  Patient discussed cancer returning.  Patient denied wanting to participate in support groups at this time.  Patient a support systems including friends.  Patient prefers phone visit for future appointments with [facility].  Patient may benefit from ongoing therapy.  Plan: Follow up with behavioral health clinician on : within the next 30 days.  I discussed the  assessment and treatment plan with the patient and/or parent/guardian. They were provided an opportunity to ask questions and all were answered. They agreed with the plan and demonstrated an understanding of the instructions.   They were advised to call back or seek an in-person evaluation if the symptoms worsen or if the condition fails to improve as anticipated.  Christen Butter, MSW, LCSW-A She/Her Behavioral Health Clinician [facility]  Internal Medicine Center Direct Dial:[phone]  Fax [phone] Main Office Phone: [phone] [address] [place] [place], [place] [address] Website: [facility] Internal Medicine [facility]  [place], [place]  [place]

## 2023-03-19 ENCOUNTER — Other Ambulatory Visit: Payer: Self-pay

## 2023-03-19 ENCOUNTER — Telehealth: Payer: Self-pay | Admitting: *Deleted

## 2023-03-19 ENCOUNTER — Inpatient Hospital Stay: Payer: Medicare HMO

## 2023-03-19 VITALS — BP 154/71 | HR 83 | Temp 97.7°F | Resp 18 | Wt 180.1 lb

## 2023-03-19 DIAGNOSIS — E860 Dehydration: Secondary | ICD-10-CM

## 2023-03-19 DIAGNOSIS — Z79630 Long term (current) use of alkylating agent: Secondary | ICD-10-CM | POA: Diagnosis not present

## 2023-03-19 DIAGNOSIS — Z5111 Encounter for antineoplastic chemotherapy: Secondary | ICD-10-CM | POA: Diagnosis not present

## 2023-03-19 DIAGNOSIS — R971 Elevated cancer antigen 125 [CA 125]: Secondary | ICD-10-CM | POA: Diagnosis not present

## 2023-03-19 DIAGNOSIS — Z79633 Long term (current) use of mitotic inhibitor: Secondary | ICD-10-CM | POA: Diagnosis not present

## 2023-03-19 DIAGNOSIS — Z880 Allergy status to penicillin: Secondary | ICD-10-CM | POA: Diagnosis not present

## 2023-03-19 DIAGNOSIS — Z885 Allergy status to narcotic agent status: Secondary | ICD-10-CM | POA: Diagnosis not present

## 2023-03-19 DIAGNOSIS — Z7952 Long term (current) use of systemic steroids: Secondary | ICD-10-CM | POA: Diagnosis not present

## 2023-03-19 DIAGNOSIS — D6481 Anemia due to antineoplastic chemotherapy: Secondary | ICD-10-CM | POA: Diagnosis not present

## 2023-03-19 DIAGNOSIS — Z79620 Long term (current) use of immunosuppressive biologic: Secondary | ICD-10-CM | POA: Diagnosis not present

## 2023-03-19 DIAGNOSIS — Z5112 Encounter for antineoplastic immunotherapy: Secondary | ICD-10-CM | POA: Diagnosis not present

## 2023-03-19 DIAGNOSIS — C55 Malignant neoplasm of uterus, part unspecified: Secondary | ICD-10-CM

## 2023-03-19 DIAGNOSIS — R110 Nausea: Secondary | ICD-10-CM | POA: Diagnosis not present

## 2023-03-19 DIAGNOSIS — T451X5A Adverse effect of antineoplastic and immunosuppressive drugs, initial encounter: Secondary | ICD-10-CM | POA: Diagnosis not present

## 2023-03-19 DIAGNOSIS — Z79899 Other long term (current) drug therapy: Secondary | ICD-10-CM | POA: Diagnosis not present

## 2023-03-19 DIAGNOSIS — R7989 Other specified abnormal findings of blood chemistry: Secondary | ICD-10-CM | POA: Diagnosis not present

## 2023-03-19 DIAGNOSIS — Z7901 Long term (current) use of anticoagulants: Secondary | ICD-10-CM | POA: Diagnosis not present

## 2023-03-19 DIAGNOSIS — Z888 Allergy status to other drugs, medicaments and biological substances status: Secondary | ICD-10-CM | POA: Diagnosis not present

## 2023-03-19 DIAGNOSIS — E878 Other disorders of electrolyte and fluid balance, not elsewhere classified: Secondary | ICD-10-CM | POA: Diagnosis not present

## 2023-03-19 MED ORDER — SODIUM CHLORIDE 0.9 % IV SOLN: INTRAVENOUS | Status: DC

## 2023-03-19 MED ORDER — HEPARIN SOD (PORK) LOCK FLUSH 100 UNIT/ML IV SOLN
500.0000 [IU] | Freq: Once | INTRAVENOUS | Status: AC
  Administered 2023-03-19: 500 [IU]

## 2023-03-19 MED ORDER — SODIUM CHLORIDE 0.9% FLUSH
10.0000 mL | Freq: Once | INTRAVENOUS | Status: AC
  Administered 2023-03-19: 10 mL

## 2023-03-19 NOTE — Patient Instructions (Signed)

## 2023-03-19 NOTE — Telephone Encounter (Signed)
Patient called and requested IV fluids either today or tomorrow. She denies diarrhea/nausea/vomiting. States she feels weak and has headache. Her treatment is Friday and she thinks this will help her feel better by then. Dr. [name] informed and stated patient should get fluids today or tomorrow whenever space available in infusion: 1 L NS over 2 hours, orders in supportive care plan.  Infusion room can schedule at 1030 this am. Contacted patient and she is in agreement with plan and will be here at 1030 for fluids.

## 2023-03-20 ENCOUNTER — Telehealth: Payer: Self-pay

## 2023-03-20 MED FILL — Dexamethasone Sodium Phosphate Inj 100 MG/10ML: INTRAMUSCULAR | Qty: 1 | Status: AC

## 2023-03-20 MED FILL — Fosaprepitant Dimeglumine For IV Infusion 150 MG (Base Eq): INTRAVENOUS | Qty: 5 | Status: AC

## 2023-03-20 NOTE — Telephone Encounter (Signed)
Returned Pt's call regarding ankle swelling. Pt reports bilateral ankle swelling that has worsened since yesterday and has travel "slightly" to knees. Pt denies SOB, heart palpitations, or pain. Pt reports the swelling began even prior to IVF given yesterday. Per PA, Pt is stable enough to wait until scheduled MD appt tomorrow. Advised Pt to seek care from ED if sx worsen between now and then. Pt verbalized understanding.

## 2023-03-21 ENCOUNTER — Other Ambulatory Visit: Payer: Self-pay | Admitting: Hematology and Oncology

## 2023-03-21 ENCOUNTER — Other Ambulatory Visit: Payer: Self-pay

## 2023-03-21 ENCOUNTER — Inpatient Hospital Stay: Payer: Medicare HMO

## 2023-03-21 ENCOUNTER — Other Ambulatory Visit (HOSPITAL_COMMUNITY): Payer: Self-pay

## 2023-03-21 ENCOUNTER — Inpatient Hospital Stay: Payer: Medicare HMO | Admitting: Hematology and Oncology

## 2023-03-21 ENCOUNTER — Inpatient Hospital Stay: Payer: Medicare HMO | Admitting: Dietician

## 2023-03-21 ENCOUNTER — Encounter: Payer: Self-pay | Admitting: Hematology and Oncology

## 2023-03-21 VITALS — BP 117/59 | HR 75 | Temp 98.4°F | Resp 18

## 2023-03-21 VITALS — BP 142/60 | HR 80 | Temp 97.5°F | Resp 18 | Ht 63.0 in | Wt 183.2 lb

## 2023-03-21 DIAGNOSIS — Z885 Allergy status to narcotic agent status: Secondary | ICD-10-CM | POA: Diagnosis not present

## 2023-03-21 DIAGNOSIS — D6481 Anemia due to antineoplastic chemotherapy: Secondary | ICD-10-CM

## 2023-03-21 DIAGNOSIS — C55 Malignant neoplasm of uterus, part unspecified: Secondary | ICD-10-CM

## 2023-03-21 DIAGNOSIS — R7989 Other specified abnormal findings of blood chemistry: Secondary | ICD-10-CM | POA: Diagnosis not present

## 2023-03-21 DIAGNOSIS — Z888 Allergy status to other drugs, medicaments and biological substances status: Secondary | ICD-10-CM | POA: Diagnosis not present

## 2023-03-21 DIAGNOSIS — Z5111 Encounter for antineoplastic chemotherapy: Secondary | ICD-10-CM | POA: Diagnosis not present

## 2023-03-21 DIAGNOSIS — D508 Other iron deficiency anemias: Secondary | ICD-10-CM | POA: Diagnosis not present

## 2023-03-21 DIAGNOSIS — R971 Elevated cancer antigen 125 [CA 125]: Secondary | ICD-10-CM | POA: Diagnosis not present

## 2023-03-21 DIAGNOSIS — E860 Dehydration: Secondary | ICD-10-CM | POA: Diagnosis not present

## 2023-03-21 DIAGNOSIS — R110 Nausea: Secondary | ICD-10-CM | POA: Diagnosis not present

## 2023-03-21 DIAGNOSIS — Z7901 Long term (current) use of anticoagulants: Secondary | ICD-10-CM | POA: Diagnosis not present

## 2023-03-21 DIAGNOSIS — Z7952 Long term (current) use of systemic steroids: Secondary | ICD-10-CM | POA: Diagnosis not present

## 2023-03-21 DIAGNOSIS — T451X5A Adverse effect of antineoplastic and immunosuppressive drugs, initial encounter: Secondary | ICD-10-CM

## 2023-03-21 DIAGNOSIS — Z5112 Encounter for antineoplastic immunotherapy: Secondary | ICD-10-CM | POA: Diagnosis not present

## 2023-03-21 DIAGNOSIS — E878 Other disorders of electrolyte and fluid balance, not elsewhere classified: Secondary | ICD-10-CM | POA: Diagnosis not present

## 2023-03-21 DIAGNOSIS — Z79630 Long term (current) use of alkylating agent: Secondary | ICD-10-CM | POA: Diagnosis not present

## 2023-03-21 DIAGNOSIS — Z79899 Other long term (current) drug therapy: Secondary | ICD-10-CM | POA: Diagnosis not present

## 2023-03-21 DIAGNOSIS — Z79620 Long term (current) use of immunosuppressive biologic: Secondary | ICD-10-CM | POA: Diagnosis not present

## 2023-03-21 DIAGNOSIS — Z79633 Long term (current) use of mitotic inhibitor: Secondary | ICD-10-CM | POA: Diagnosis not present

## 2023-03-21 DIAGNOSIS — Z880 Allergy status to penicillin: Secondary | ICD-10-CM | POA: Diagnosis not present

## 2023-03-21 LAB — CBC WITH DIFFERENTIAL (CANCER CENTER ONLY)
Abs Immature Granulocytes: 0.03 10*3/uL (ref 0.00–0.07)
Basophils Absolute: 0 10*3/uL (ref 0.0–0.1)
Basophils Relative: 0 %
Eosinophils Absolute: 0 10*3/uL (ref 0.0–0.5)
Eosinophils Relative: 1 %
HCT: 25.4 % — ABNORMAL LOW (ref 36.0–46.0)
Hemoglobin: 7.9 g/dL — ABNORMAL LOW (ref 12.0–15.0)
Immature Granulocytes: 1 %
Lymphocytes Relative: 30 %
Lymphs Abs: 1.7 10*3/uL (ref 0.7–4.0)
MCH: 25.6 pg — ABNORMAL LOW (ref 26.0–34.0)
MCHC: 31.1 g/dL (ref 30.0–36.0)
MCV: 82.2 fL (ref 80.0–100.0)
Monocytes Absolute: 0.5 10*3/uL (ref 0.1–1.0)
Monocytes Relative: 8 %
Neutro Abs: 3.5 10*3/uL (ref 1.7–7.7)
Neutrophils Relative %: 60 %
Platelet Count: 268 10*3/uL (ref 150–400)
RBC: 3.09 MIL/uL — ABNORMAL LOW (ref 3.87–5.11)
RDW: 18.3 % — ABNORMAL HIGH (ref 11.5–15.5)
WBC Count: 5.7 10*3/uL (ref 4.0–10.5)
nRBC: 0 % (ref 0.0–0.2)

## 2023-03-21 LAB — CMP (CANCER CENTER ONLY)
ALT: 7 U/L (ref 0–44)
AST: 10 U/L — ABNORMAL LOW (ref 15–41)
Albumin: 3.8 g/dL (ref 3.5–5.0)
Alkaline Phosphatase: 113 U/L (ref 38–126)
Anion gap: 8 (ref 5–15)
BUN: 9 mg/dL (ref 8–23)
CO2: 26 mmol/L (ref 22–32)
Calcium: 7.8 mg/dL — ABNORMAL LOW (ref 8.9–10.3)
Chloride: 108 mmol/L (ref 98–111)
Creatinine: 0.88 mg/dL (ref 0.44–1.00)
GFR, Estimated: >60 mL/min (ref >60)
Glucose, Bld: 100 mg/dL — ABNORMAL HIGH (ref 70–99)
Potassium: 3.5 mmol/L (ref 3.5–5.1)
Sodium: 142 mmol/L (ref 135–145)
Total Bilirubin: 0.3 mg/dL (ref 0.3–1.2)
Total Protein: 6.4 g/dL — ABNORMAL LOW (ref 6.5–8.1)

## 2023-03-21 LAB — VITAMIN B12: Vitamin B-12: 127 pg/mL — ABNORMAL LOW (ref 180–914)

## 2023-03-21 LAB — IRON AND IRON BINDING CAPACITY (CC-WL,HP ONLY)
Iron: 42 ug/dL (ref 28–170)
Saturation Ratios: 16 % (ref 10.4–31.8)
TIBC: 260 ug/dL (ref 250–450)
UIBC: 218 ug/dL (ref 148–442)

## 2023-03-21 LAB — PREPARE RBC (CROSSMATCH)

## 2023-03-21 LAB — TYPE AND SCREEN
ABO/RH(D): O POS
Unit division: 0

## 2023-03-21 LAB — FERRITIN: Ferritin: 232 ng/mL (ref 11–307)

## 2023-03-21 LAB — TSH: TSH: 5.928 u[IU]/mL — ABNORMAL HIGH (ref 0.350–4.500)

## 2023-03-21 LAB — MAGNESIUM: Magnesium: 0.9 mg/dL — CL (ref 1.7–2.4)

## 2023-03-21 MED ORDER — SODIUM CHLORIDE 0.9 % IV SOLN
10.0000 mg | Freq: Once | INTRAVENOUS | Status: AC
  Administered 2023-03-21: 10 mg via INTRAVENOUS
  Filled 2023-03-21: qty 10

## 2023-03-21 MED ORDER — DIPHENHYDRAMINE HCL 25 MG PO CAPS
25.0000 mg | ORAL_CAPSULE | Freq: Once | ORAL | Status: AC
  Administered 2023-03-21: 25 mg via ORAL
  Filled 2023-03-21: qty 1

## 2023-03-21 MED ORDER — SODIUM CHLORIDE 0.9 % IV SOLN
500.0000 mg | Freq: Once | INTRAVENOUS | Status: AC
  Administered 2023-03-21: 500 mg via INTRAVENOUS
  Filled 2023-03-21: qty 10

## 2023-03-21 MED ORDER — SODIUM CHLORIDE 0.9% IV SOLUTION
250.0000 mL | Freq: Once | INTRAVENOUS | Status: AC
  Administered 2023-03-21: 250 mL via INTRAVENOUS

## 2023-03-21 MED ORDER — CYANOCOBALAMIN 1000 MCG/ML IJ SOLN
1000.0000 ug | Freq: Once | INTRAMUSCULAR | Status: AC
  Administered 2023-03-21: 1000 ug via INTRAMUSCULAR
  Filled 2023-03-21: qty 1

## 2023-03-21 MED ORDER — CETIRIZINE HCL 10 MG/ML IV SOLN
10.0000 mg | Freq: Once | INTRAVENOUS | Status: AC
  Administered 2023-03-21: 10 mg via INTRAVENOUS
  Filled 2023-03-21: qty 1

## 2023-03-21 MED ORDER — SODIUM CHLORIDE 0.9% FLUSH: 10.0000 mL | INTRAVENOUS | Status: DC | PRN

## 2023-03-21 MED ORDER — SODIUM CHLORIDE 0.9 % IV SOLN
150.0000 mg | Freq: Once | INTRAVENOUS | Status: AC
  Administered 2023-03-21: 150 mg via INTRAVENOUS
  Filled 2023-03-21: qty 150

## 2023-03-21 MED ORDER — HEPARIN SOD (PORK) LOCK FLUSH 100 UNIT/ML IV SOLN
500.0000 [IU] | Freq: Once | INTRAVENOUS | Status: AC | PRN
  Administered 2023-03-21: 500 [IU]

## 2023-03-21 MED ORDER — ACETAMINOPHEN 325 MG PO TABS
650.0000 mg | ORAL_TABLET | Freq: Once | ORAL | Status: AC
  Administered 2023-03-21: 650 mg via ORAL
  Filled 2023-03-21: qty 2

## 2023-03-21 MED ORDER — PALONOSETRON HCL INJECTION 0.25 MG/5ML
0.2500 mg | Freq: Once | INTRAVENOUS | Status: AC
  Administered 2023-03-21: 0.25 mg via INTRAVENOUS
  Filled 2023-03-21: qty 5

## 2023-03-21 MED ORDER — SODIUM CHLORIDE 0.9 % IV SOLN: Freq: Once | INTRAVENOUS | Status: AC

## 2023-03-21 MED ORDER — MAGNESIUM OXIDE -MG SUPPLEMENT 400 (240 MG) MG PO TABS
400.0000 mg | ORAL_TABLET | Freq: Two times a day (BID) | ORAL | 1 refills | Status: DC
  Filled 2023-03-21: qty 60, 30d supply, fill #0

## 2023-03-21 MED ORDER — FAMOTIDINE IN NACL 20-0.9 MG/50ML-% IV SOLN
20.0000 mg | Freq: Once | INTRAVENOUS | Status: AC
  Administered 2023-03-21: 20 mg via INTRAVENOUS
  Filled 2023-03-21: qty 50

## 2023-03-21 MED ORDER — SODIUM CHLORIDE 0.9 % IV SOLN
175.0000 mg/m2 | Freq: Once | INTRAVENOUS | Status: AC
  Administered 2023-03-21: 336 mg via INTRAVENOUS
  Filled 2023-03-21: qty 56

## 2023-03-21 MED ORDER — SODIUM CHLORIDE 0.9% FLUSH
10.0000 mL | INTRAVENOUS | Status: AC | PRN
  Administered 2023-03-21: 10 mL

## 2023-03-21 MED ORDER — SODIUM CHLORIDE 0.9 % IV SOLN
378.8000 mg | Freq: Once | INTRAVENOUS | Status: AC
  Administered 2023-03-21: 380 mg via INTRAVENOUS
  Filled 2023-03-21: qty 38

## 2023-03-21 MED ORDER — SODIUM CHLORIDE 0.9% FLUSH
10.0000 mL | Freq: Once | INTRAVENOUS | Status: AC
  Administered 2023-03-21: 10 mL

## 2023-03-21 NOTE — Progress Notes (Signed)
Per [name] MD, ok to treat with HGB 7.9 today. Pt receiving one unit PRBC after tx.  Pt states she forgot to take Decadron at home Thursday and today prior to arriving for infusion. MD [name] notified. Per MD ok to proceed with TX today.  Pt to receive pre-meds under supportive care prior to receiving magnesium tomorrow to prevent stomach cramping.   Per [name] MD, ok to run blood at 344ml/hr

## 2023-03-21 NOTE — Assessment & Plan Note (Signed)
The cause of her low magnesium is unknown Due to time constraint, she will return tomorrow to get IV magnesium Recommend increase the dose of oral magnesium supplement

## 2023-03-21 NOTE — Assessment & Plan Note (Signed)
She has multifactorial anemia I will order vitamin B12 and iron studies We discussed the risk and benefits of pursuing treatment versus delaying treatment and the patient would like to pursue treatment today with blood transfusion support We discussed some of the risks, benefits, and alternatives of blood transfusions. The patient is symptomatic from anemia and the hemoglobin level is critically low.  Some of the side-effects to be expected including risks of transfusion reactions, chills, infection, syndrome of volume overload and risk of hospitalization from various reasons and the patient is willing to proceed and went ahead to sign consent today.

## 2023-03-21 NOTE — Addendum Note (Signed)
Addended byBertis Ruddy, Trystan Akhtar on: 03/21/2023 10:30 AM   Modules accepted: Orders

## 2023-03-21 NOTE — Progress Notes (Unsigned)
Nutrition Follow-up:  Patient with recurrent uterine cancer. She is receiving paraplatin, taxol, and jemperli q21d.   Met with patient in infusion. She reports multiple episodes of diarrhea lasting the first week after infusion. She does not eat much during this time. Patient says appetite has been pretty good the last 2 weeks. Yesterday patient had scrambled eggs, multi-grain toast and coffee for breakfast. Patient made chili and ate 2 hotdogs for dinner. She recalls 3-4 bottles of water most days.   Medications: reviewed  Labs: Hgb 7.9  Anthropometrics: Wt 183 lb 3.2 oz today increased - noted bilateral lower extremity swelling  5/10 - 176 lb   Estimated Energy Needs  Kcals: 1700-2000 Protein: 85-100 g Fluid: >1.7 L  NUTRITION DIAGNOSIS: Inadequate oral intake continues   INTERVENTION:  Encouraged high protein snacks in between meals Suggested daily protein shake with poor appetite - samples of Ensure Max, coupons, and CIB powder  Discussed strategies for diarrhea, educated on foods with pectin - samples of banatrol given Contact information provided     MONITORING, EVALUATION, GOAL: weight trends, intake   NEXT VISIT: Friday June 14 during infusion

## 2023-03-21 NOTE — Progress Notes (Signed)
CRITICAL VALUE STICKER  CRITICAL VALUE: Magnesium 0.9  RECEIVER (on-site recipient of call): Murriel Hopper, RN  DATE & TIME NOTIFIED: 03/21/23 at 0927  MD NOTIFIED: Artis Delay, MD  TIME OF NOTIFICATION: 1610  RESPONSE:  Mag infusion ordered and scheduled for next day per MD and infusion charge RN

## 2023-03-21 NOTE — Patient Instructions (Signed)
[facility]  Discharge Instructions: Thank you for choosing [facility] to provide your oncology and hematology care.   If you have a lab appointment with the Cancer Center, please go directly to the Cancer Center and check in at the registration area.   Wear comfortable clothing and clothing appropriate for easy access to any Portacath or PICC line.   We strive to give you quality time with your provider. You may need to reschedule your appointment if you arrive late (15 or more minutes).  Arriving late affects you and other patients whose appointments are after yours.  Also, if you miss three or more appointments without notifying the office, you may be dismissed from the clinic at the provider's discretion.      For prescription refill requests, have your pharmacy contact our office and allow 72 hours for refills to be completed.    Today you received the following chemotherapy and/or immunotherapy agents: Jemperli, Paclitaxel, Carboplatin.      To help prevent nausea and vomiting after your treatment, we encourage you to take your nausea medication as directed.  BELOW ARE SYMPTOMS THAT SHOULD BE REPORTED IMMEDIATELY: *FEVER GREATER THAN 100.4 F (38 C) OR HIGHER *CHILLS OR SWEATING *NAUSEA AND VOMITING THAT IS NOT CONTROLLED WITH YOUR NAUSEA MEDICATION *UNUSUAL SHORTNESS OF BREATH *UNUSUAL BRUISING OR BLEEDING *URINARY PROBLEMS (pain or burning when urinating, or frequent urination) *BOWEL PROBLEMS (unusual diarrhea, constipation, pain near the anus) TENDERNESS IN MOUTH AND THROAT WITH OR WITHOUT PRESENCE OF ULCERS (sore throat, sores in mouth, or a toothache) UNUSUAL RASH, SWELLING OR PAIN  UNUSUAL VAGINAL DISCHARGE OR ITCHING   Items with * indicate a potential emergency and should be followed up as soon as possible or go to the Emergency Department if any problems should occur.  Please show the CHEMOTHERAPY ALERT CARD or  IMMUNOTHERAPY ALERT CARD at check-in to the Emergency Department and triage nurse.  Should you have questions after your visit or need to cancel or reschedule your appointment, please contact [facility]  Dept: [phone]  and follow the prompts.  Office hours are 8:00 a.m. to 4:30 p.m. Monday - Friday. Please note that voicemails left after 4:00 p.m. may not be returned until the following business day.  We are closed weekends and major holidays. You have access to a nurse at all times for urgent questions. Please call the main number to the clinic Dept: [phone] and follow the prompts.   For any non-urgent questions, you may also contact your provider using MyChart. We now offer e-Visits for anyone 55 and older to request care online for non-urgent symptoms. For details visit [email or web address].   Also download the MyChart app! Go to the app store, search "MyChart", open the app, select [facility], and log in with your MyChart username and password.  Blood Transfusion, Adult, Care After The following information offers guidance on how to care for yourself after your procedure. Your health care provider may also give you more specific instructions. If you have problems or questions, contact your health care provider. What can I expect after the procedure? After the procedure, it is common to have: Bruising and soreness where the IV was inserted. A headache. Follow these instructions at home: IV insertion site care     Follow instructions from your health care provider about how to take care of your IV insertion site. Make sure you: Wash your hands with soap  and water for at least 20 seconds before and after you change your bandage (dressing). If soap and water are not available, use hand sanitizer. Change your dressing as told by your health care provider. Check your IV insertion site every day for signs of infection. Check for: Redness,  swelling, or pain. Bleeding from the site. Warmth. Pus or a bad smell. General instructions Take over-the-counter and prescription medicines only as told by your health care provider. Rest as told by your health care provider. Return to your normal activities as told by your health care provider. Keep all follow-up visits. Lab tests may need to be done at certain periods to recheck your blood counts. Contact a health care provider if: You have itching or red, swollen areas of skin (hives). You have a fever or chills. You have pain in the head, back, or chest. You feel anxious or you feel weak after doing your normal activities. You have redness, swelling, warmth, or pain around the IV insertion site. You have blood coming from the IV insertion site that does not stop with pressure. You have pus or a bad smell coming from your IV insertion site. If you received your blood transfusion in an outpatient setting, you will be told whom to contact to report any reactions. Get help right away if: You have symptoms of a serious allergic or immune system reaction, including: Trouble breathing or shortness of breath. Swelling of the face, feeling flushed, or widespread rash. Dark urine or blood in the urine. Fast heartbeat. These symptoms may be an emergency. Get help right away. Call 911. Do not wait to see if the symptoms will go away. Do not drive yourself to the hospital. Summary Bruising and soreness around the IV insertion site are common. Check your IV insertion site every day for signs of infection. Rest as told by your health care provider. Return to your normal activities as told by your health care provider. Get help right away for symptoms of a serious allergic or immune system reaction to the blood transfusion. This information is not intended to replace advice given to you by your health care provider. Make sure you discuss any questions you have with your health care  provider. Document Revised: 01/11/2022 Document Reviewed: 01/11/2022 Elsevier Patient Education  2024 [employer].

## 2023-03-21 NOTE — Progress Notes (Addendum)
[facility] OFFICE PROGRESS NOTE  Patient Care Team: [name], MD as PCP - General (Internal Medicine) Rennis Golden Lisette Abu, MD as PCP - Cardiology (Cardiology) Jodelle Gross, RN as [facility] Care Management  ASSESSMENT & PLAN:  Uterine cancer [facility]) She tolerated chemotherapy poorly with multiple abnormal labs even though clinically, she is improving Will proceed with treatment with additional supportive care including blood transfusion support and IV magnesium Will proceed with treatment with slight dose reduction She will get imaging study done before her next cycle of therapy  Anemia due to antineoplastic chemotherapy She has multifactorial anemia I will order vitamin B12 and iron studies We discussed the risk and benefits of pursuing treatment versus delaying treatment and the patient would like to pursue treatment today with blood transfusion support We discussed some of the risks, benefits, and alternatives of blood transfusions. The patient is symptomatic from anemia and the hemoglobin level is critically low.  Some of the side-effects to be expected including risks of transfusion reactions, chills, infection, syndrome of volume overload and risk of hospitalization from various reasons and the patient is willing to proceed and went ahead to sign consent today.   Hypomagnesemia The cause of her low magnesium is unknown Due to time constraint, she will return tomorrow to get IV magnesium Recommend increase the dose of oral magnesium supplement  Orders Placed This Encounter  Procedures   CT CHEST ABDOMEN PELVIS W CONTRAST    Standing Status:   Future    Standing Expiration Date:   03/20/2024    Order Specific Question:   If indicated for the ordered procedure, I authorize the administration of contrast media per Radiology protocol    Answer:   Yes    Order Specific Question:   Does the patient have a contrast media/X-ray dye allergy?     Answer:   No    Order Specific Question:   Preferred imaging location?    Answer:   [facility]    Order Specific Question:   If indicated for the ordered procedure, I authorize the administration of oral contrast media per Radiology protocol    Answer:   Yes   Iron and Iron Binding Capacity ([facility] only)    Standing Status:   Future    Number of Occurrences:   1    Standing Expiration Date:   03/20/2024   Ferritin    Standing Status:   Future    Number of Occurrences:   1    Standing Expiration Date:   03/20/2024   Vitamin B12    Standing Status:   Future    Number of Occurrences:   1    Standing Expiration Date:   03/20/2024   Informed Consent Details: Physician/Practitioner Attestation; Transcribe to consent form and obtain patient signature    Standing Status:   Future    Number of Occurrences:   1    Standing Expiration Date:   03/20/2024    Order Specific Question:   Physician/Practitioner attestation of informed consent for blood and or blood product transfusion    Answer:   I, the physician/practitioner, attest that I have discussed with the patient the benefits, risks, side effects, alternatives, likelihood of achieving goals and potential problems during recovery for the procedure that I have provided informed consent.    Order Specific Question:   Product(s)    Answer:   All Product(s)   Care order/instruction    Transfuse Parameters  Standing Status:   Future    Number of Occurrences:   1    Standing Expiration Date:   03/20/2024   Sample to Blood Bank    Standing Status:   Standing    Number of Occurrences:   9    Standing Expiration Date:   03/20/2024   Type and screen         Standing Status:   Future    Number of Occurrences:   1    Standing Expiration Date:   03/20/2024    All questions were answered. The patient knows to call the clinic with any problems, questions or concerns. The total time spent in the appointment was 40 minutes encounter with  patients including review of chart and various tests results, discussions about plan of care and coordination of care plan   Artis Delay, MD 03/21/2023 10:30 AM  INTERVAL HISTORY: Please see below for problem oriented charting. she returns for treatment follow-up She denies recent bleeding Her appetite has improved and she denies recent constipation We spent a lot of time reviewing test results and discussed the risk and benefits of blood transfusion support, the role of imaging studies and others She denies worsening peripheral neuropathy  REVIEW OF SYSTEMS:   Constitutional: Denies fevers, chills or abnormal weight loss Eyes: Denies blurriness of vision Ears, nose, mouth, throat, and face: Denies mucositis or sore throat Respiratory: Denies cough, dyspnea or wheezes Cardiovascular: Denies palpitation, chest discomfort or lower extremity swelling Gastrointestinal:  Denies nausea, heartburn or change in bowel habits Skin: Denies abnormal skin rashes Lymphatics: Denies new lymphadenopathy or easy bruising Neurological:Denies numbness, tingling or new weaknesses Behavioral/Psych: Mood is stable, no new changes  All other systems were reviewed with the patient and are negative.  I have reviewed the past medical history, past surgical history, social history and family history with the patient and they are unchanged from previous note.  ALLERGIES:  is allergic to codeine sulfate, penicillins, percocet [oxycodone-acetaminophen], and zestril [lisinopril].  MEDICATIONS:  Current Outpatient Medications  Medication Sig Dispense Refill   acetaminophen (TYLENOL) 500 MG tablet Take 1,000 mg by mouth every 6 (six) hours as needed for mild pain.     albuterol (VENTOLIN HFA) 108 (90 Base) MCG/ACT inhaler Inhale 2 puffs into the lungs every 4 (four) hours as needed for wheezing or shortness of breath. 8 g 0   atorvastatin (LIPITOR) 20 MG tablet Take 1 tablet (20 mg total) by mouth at bedtime. 30  tablet 11   B Complex-C (B-COMPLEX WITH VITAMIN C) tablet Take 1 tablet by mouth in the morning.     Blood Glucose Monitoring Suppl (ONE TOUCH ULTRA MINI) w/Device KIT Please use as directed. 1 each 0   Cholecalciferol 1000 units capsule Take 1 capsule (1,000 Units total) by mouth daily. 90 capsule 1   dexamethasone (DECADRON) 4 MG tablet Take 1 tablet (4 mg total) by mouth daily. 30 tablet 6   diltiazem (CARDIZEM CD) 120 MG 24 hr capsule Take 1 capsule (120 mg total) by mouth daily. 30 capsule 11   dorzolamide-timolol (COSOPT) 22.3-6.8 MG/ML ophthalmic solution Place 1 drop into both eyes 2 (two) times daily.     glucose blood (ONE TOUCH ULTRA TEST) test strip Use as instructed 100 each 1   hydrALAZINE (APRESOLINE) 25 MG tablet TAKE 1 TABLET BY MOUTH THREE TIMES DAILY 90 tablet 3   HYDROmorphone (DILAUDID) 2 MG tablet Take 1 tablet (2 mg) by mouth 3 times daily as needed for severe  pain. 30 tablet 0   Insulin Pen Needle 32G X 4 MM MISC 1 each by Does not apply route daily. 100 each 3   latanoprost (XALATAN) 0.005 % ophthalmic solution Place 1 drop into both eyes at bedtime.     lidocaine-prilocaine (EMLA) cream Apply to affected area once as directed 30 g 3   losartan (COZAAR) 100 MG tablet Take 1 tablet (100 mg total) by mouth at bedtime. 90 tablet 3   magnesium oxide (MAG-OX) 400 (240 Mg) MG tablet Take 1 tablet (400 mg total) by mouth 2 (two) times daily. 60 tablet 1   metFORMIN (GLUCOPHAGE) 1000 MG tablet Take 1 tablet (1,000 mg total) by mouth 2 (two) times daily. 180 tablet 3   ondansetron (ZOFRAN) 8 MG tablet Take 1 tablet (8 mg total) by mouth every 8 (eight) hours as needed for nausea or vomiting. Start on the third day after chemotherapy. 30 tablet 1   ONETOUCH DELICA LANCETS 33G MISC Please use as directed. 100 each 0   pantoprazole (PROTONIX) 40 MG tablet Take 1 tablet by mouth daily. 360 tablet 0   polyethylene glycol powder (GLYCOLAX/MIRALAX) 17 GM/SCOOP powder Take 17 g by mouth  daily. 238 g 1   prochlorperazine (COMPAZINE) 10 MG tablet Take 1 tablet (10 mg total) by mouth every 6 (six) hours as needed for nausea or vomiting. 30 tablet 1   rivaroxaban (XARELTO) 20 MG TABS tablet Take 1 tablet (20 mg total) by mouth daily with supper. 90 tablet 1   No current facility-administered medications for this visit.   Facility-Administered Medications Ordered in Other Visits  Medication Dose Route Frequency Provider Last Rate Last Admin   CARBOplatin (PARAPLATIN) 380 mg in sodium chloride 0.9 % 100 mL chemo infusion  380 mg Intravenous Once [name], Nissim Fleischer, MD       dostarlimab-gxly (JEMPERLI) 500 mg in sodium chloride 0.9 % 100 mL (4.5455 mg/mL) chemo infusion  500 mg Intravenous Once [name], Talin Feister, MD       fosaprepitant (EMEND) 150 mg in sodium chloride 0.9 % 145 mL IVPB  150 mg Intravenous Once [name], Markeria Goetsch, MD 450 mL/hr at 03/21/23 1015 150 mg at 03/21/23 1015   heparin lock flush 100 unit/mL  500 Units Intracatheter Once PRN [name], Dulce Martian, MD       PACLitaxel (TAXOL) 336 mg in sodium chloride 0.9 % 500 mL chemo infusion (> 80mg /m2)  175 mg/m2 (Treatment Plan Recorded) Intravenous Once Mikaylee Arseneau, MD       sodium chloride flush (NS) 0.9 % injection 10 mL  10 mL Intracatheter PRN [name], Darion Milewski, MD        SUMMARY OF ONCOLOGIC HISTORY: Oncology History Overview Note  High grade serous, 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 Heterogeneous endometrial thickening with appearance of moderate complex fluid in the endometrial canal, possible hemorrhagic material. Endometrial thickness is considered abnormal for an asymptomatic post-menopausal female. Endometrial sampling should be considered to exclude carcinoma. 2. Nonvisualized ovary 3. Heterogeneous thick-walled urinary bladder, correlate for cystitis.   12/28/2021 Pathology Results   CYTOLOGY - NON PAP  CASE: [id number]  PATIENT: Sherri Gill  Non-Gynecological  Cytology Report   Clinical History: Abnormal pelvic US, highly suspicious of malignant ascites  Specimen Submitted:  A. ASCITES, PARACENTESIS:    FINAL MICROSCOPIC DIAGNOSIS:  - Malignant cells consistent with adenocarcinoma  - See comment   SPECIMEN ADEQUACY:  Satisfactory for evaluation   DIAGNOSTIC COMMENTS:  Immunohistochemical stains show that the tumor cells are positive for CK7 and PAX8 while they are negative for CK20, CDX2 and ER, consistent with above interpretation.  Additionally, the tumor cells are diffusely positive for p53 (clonal overexpression), suggestive of a high-grade serous carcinoma.    12/28/2021 Imaging   US pelvis 1. Persistent abnormal appearance of the endometrium, heterogeneously thickened and complex with possible intraluminal fluid. This is similar to that seen on December 2022 ultrasound. Endometrial thickness is considered abnormal for an asymptomatic post-menopausal female. Endometrial sampling should be considered to exclude carcinoma. 2. Nonvisualization of the ovaries.   12/28/2021 Imaging   1. Large volume ascites. 2. Small umbilical hernia containing fat and ascites. 3. Haziness of anterior omentum most likely related to ascites, other etiologies such as metastatic disease can not be excluded. 4. Diffuse colonic diverticulosis without evidence for diverticulitis.   12/29/2021 Procedure   Successful ultrasound-guided paracentesis yielding 2.8 liters of peritoneal fluid.   01/14/2022 Initial Diagnosis   Uterine cancer (HCC)   01/14/2022 Cancer Staging   Staging form: Corpus Uteri - Carcinoma and Carcinosarcoma, AJCC 8th Edition - Clinical: Stage III (cT3, cN0, cM0) - Signed by Artis Delay, MD on 01/14/2022 Stage prefix: Initial diagnosis   01/15/2022 Surgery   Surgery: Cervical dilation under ultrasound guidance, hysteroscopy, endometrial sampling using the Myosure   Surgeons:  Carin Hock MD    Pathology: endometrial curetteings   Operative  findings: On EUA, 8 cm mobile uterus, some nodularity along cul de sac. Cervix normal in appearance without discernible external os. Difficulty noted in dilating the cervix after using scalpel to incise in area of what what thought to be the external os. Under ultrasound guidance, cervix was dilated with confirmation on imaging of intra-uterine placement of dilators. On hysteroscopy, abnormal appearing endocervix and very calcified tissue obscuring good visualization of endometrium concerning for replacement by tumor.    01/15/2022 Pathology Results   FINAL MICROSCOPIC DIAGNOSIS:   A.   ENDOMETRIUM, CURETTAGE:  -    Serous endometrial carcinoma.   COMMENT:   The carcinoma is strongly and diffusely positive for p53 and diffuse p16 positivity.  The ER shows relatively diffuse positivity.  Overall, the histologic features (slit-like glandular spaces and psammoma bodies) and the immunohistochemical phenotype are most characteristic for a serous carcinoma.    01/21/2022 Procedure   Placement of single lumen port a cath via right internal jugular vein. The catheter tip lies at the cavo-atrial junction. A power injectable port a cath was placed and is ready for immediate use.     01/25/2022 - 06/21/2022 Chemotherapy   Patient is on Treatment Plan : UTERINE Carboplatin AUC 6 / Paclitaxel q21d     03/21/2022 Imaging   1. Near complete resolution of previously noted ascites, with only small volume residual perihepatic ascites. 2. Improved omental caking and stranding in the ventral abdomen. 3. Findings are consistent with treatment response of peritoneal metastatic disease. 4. Thickening of the urinary bladder wall , consistent with nonspecific infectious or inflammatory cystitis. Correlate with urinalysis.   Aortic Atherosclerosis (ICD10-I70.0).       03/22/2022 Tumor Marker   Patient's tumor was tested for the following markers: CA-125. Results of the tumor marker test revealed 21.5.   04/09/2022  Surgery     Surgery: Diagnostic laparoscopy, conversion to exploratory laparotomy, partial omentectomy, lysis of adhesions for approximately 60 minutes, total abdominal hysterectomy, bilateral salpingo-oophorectomy, excision of ileal tumor implant and excision of transverse colon epiploica tumor implant   04/09/2022 Pathology  Results   A.   OMENTUM, RESECTION:  -    Metastatic serous carcinoma.   B.   ILEAL TUMOR IMPLANTS, EXCISION:  -    Adhesions with small focus of metastatic serous carcinoma, possibly  intralymphatic.   C.   UTERUS, CERVIX, BILATERAL FALLOPIAN TUBES AND OVARIES:  -    Serous carcinoma, endometrial, invasive into outer half of uterus,  with involvement of  cervical stroma, uterine serosa, ovaries and fallopian tubes.   -    Negative for cervical dysplasia.  -    Leiomyomas, largest 1.2 cm in greatest dimension.   D.   COLON, MESENTARY NODULE, TRANSVERSE, EXCISION:  -    Metastatic serous carcinoma   05/09/2022 Tumor Marker   Patient's tumor was tested for the following markers: CA-125. Results of the tumor marker test revealed 12.1.   05/29/2022 Tumor Marker   Patient's tumor was tested for the following markers: CA-125. Results of the tumor marker test revealed 9.1.   07/23/2022 Imaging   1. No abdominopelvic lymphadenopathy.  2. Scattered foci of fat stranding in the anterior pelvic peritoneal fat without discrete solid implants. Mild smooth wall thickening of the pelvic peritoneum. These findings are nonspecific and could represent postsurgical/post treatment change, although residual peritoneal carcinomatosis is on the differential and close follow-up CT is suggested. 3. Moderate left colonic diverticulosis. 4. Aortic Atherosclerosis (ICD10-I70.0).   07/23/2022 Tumor Marker   Patient's tumor was tested for the following markers: CA-125. Results of the tumor marker test revealed 5.2.   09/09/2022 Tumor Marker   Patient's tumor was tested for the following  markers: CA-125. Results of the tumor marker test revealed 10.1.   01/28/2023 Imaging   OMENTAL AND PERITONEAL CARCINOMATOSIS.   POTENTIAL SOURCES ARE GASTRIC (MID AND DISTAL GASTRIC WALL THICKENING) OR GENITOURINARY (SOFT TISSUE MASS EXTENDS CEPHALAD FROM THE LEFT CERVICAL CUFF TOWARDS THE LEFT ADNEXA).   IF GASTRIC, THE PELVIC MASS WOULD REPRESENT AN OMENTAL DEPOSIT. PELVIC MASS OBSTRUCTING LEFT KIDNEY, MILD HYDROURETERONEPHROSIS.   MEDIASTINAL ADENOPATHY, LIKELY NODAL DISEASE.    02/03/2023 Tumor Marker   Patient's tumor was tested for the following markers: CA-125. Results of the tumor marker test revealed 429.   02/07/2023 -  Chemotherapy   Patient is on Treatment Plan : UTERINE ENDOMETRIAL Dostarlimab-gxly (500 mg) + Carboplatin (AUC 5) + Paclitaxel (175 mg/m2) q21d x 6 cycles / Dostarlimab-gxly (1000 mg) q42d x 6 cycles      03/03/2023 Tumor Marker   Patient's tumor was tested for the following markers: CA-125. Results of the tumor marker test revealed 176.     PHYSICAL EXAMINATION: ECOG PERFORMANCE STATUS: 1 - Symptomatic but completely ambulatory  Vitals:   03/21/23 0850  BP: (!) 142/60  Pulse: 80  Resp: 18  Temp: (!) 97.5 F (36.4 C)  SpO2: 98%   Filed Weights   03/21/23 0850  Weight: 183 lb 3.2 oz (83.1 kg)    GENERAL:alert, no distress and comfortable  NEURO: alert & oriented x 3 with fluent speech, no focal motor/sensory deficits  LABORATORY DATA:  I have reviewed the data as listed    Component Value Date/Time   NA 142 03/21/2023 0816   NA 139 01/10/2022 1644   K 3.5 03/21/2023 0816   CL 108 03/21/2023 0816   CO2 26 03/21/2023 0816   GLUCOSE 100 (H) 03/21/2023 0816   GLUCOSE 93 09/10/2006 0834   BUN 9 03/21/2023 0816   BUN 13 01/10/2022 1644   CREATININE 0.88 03/21/2023 0816   CREATININE  0.64 07/13/2014 1621   CALCIUM 7.8 (L) 03/21/2023 0816   CALCIUM 9.7 03/29/2010 1554   PROT 6.4 (L) 03/21/2023 0816   PROT 7.6 11/09/2020 1156   ALBUMIN 3.8  03/21/2023 0816   ALBUMIN 4.4 11/09/2020 1156   AST 10 (L) 03/21/2023 0816   ALT 7 03/21/2023 0816   ALKPHOS 113 03/21/2023 0816   BILITOT 0.3 03/21/2023 0816   GFRNONAA >60 03/21/2023 0816   GFRNONAA >89 07/13/2014 1621   GFRAA 86 11/09/2020 1156   GFRAA >89 07/13/2014 1621    No results found for: "SPEP", "UPEP"  Lab Results  Component Value Date   WBC 5.7 03/21/2023   NEUTROABS 3.5 03/21/2023   HGB 7.9 (L) 03/21/2023   HCT 25.4 (L) 03/21/2023   MCV 82.2 03/21/2023   PLT 268 03/21/2023      Chemistry      Component Value Date/Time   NA 142 03/21/2023 0816   NA 139 01/10/2022 1644   K 3.5 03/21/2023 0816   CL 108 03/21/2023 0816   CO2 26 03/21/2023 0816   BUN 9 03/21/2023 0816   BUN 13 01/10/2022 1644   CREATININE 0.88 03/21/2023 0816   CREATININE 0.64 07/13/2014 1621      Component Value Date/Time   CALCIUM 7.8 (L) 03/21/2023 0816   CALCIUM 9.7 03/29/2010 1554   ALKPHOS 113 03/21/2023 0816   AST 10 (L) 03/21/2023 0816   ALT 7 03/21/2023 0816   BILITOT 0.3 03/21/2023 0816

## 2023-03-21 NOTE — Assessment & Plan Note (Signed)
She tolerated chemotherapy poorly with multiple abnormal labs even though clinically, she is improving Will proceed with treatment with additional supportive care including blood transfusion support and IV magnesium Will proceed with treatment with slight dose reduction She will get imaging study done before her next cycle of therapy

## 2023-03-22 ENCOUNTER — Inpatient Hospital Stay: Payer: Medicare HMO

## 2023-03-22 VITALS — BP 135/59 | HR 78 | Temp 97.9°F | Resp 16

## 2023-03-22 DIAGNOSIS — E878 Other disorders of electrolyte and fluid balance, not elsewhere classified: Secondary | ICD-10-CM | POA: Diagnosis not present

## 2023-03-22 DIAGNOSIS — D6481 Anemia due to antineoplastic chemotherapy: Secondary | ICD-10-CM | POA: Diagnosis not present

## 2023-03-22 DIAGNOSIS — Z880 Allergy status to penicillin: Secondary | ICD-10-CM | POA: Diagnosis not present

## 2023-03-22 DIAGNOSIS — Z885 Allergy status to narcotic agent status: Secondary | ICD-10-CM | POA: Diagnosis not present

## 2023-03-22 DIAGNOSIS — R7989 Other specified abnormal findings of blood chemistry: Secondary | ICD-10-CM | POA: Diagnosis not present

## 2023-03-22 DIAGNOSIS — R971 Elevated cancer antigen 125 [CA 125]: Secondary | ICD-10-CM | POA: Diagnosis not present

## 2023-03-22 DIAGNOSIS — R110 Nausea: Secondary | ICD-10-CM | POA: Diagnosis not present

## 2023-03-22 DIAGNOSIS — Z888 Allergy status to other drugs, medicaments and biological substances status: Secondary | ICD-10-CM | POA: Diagnosis not present

## 2023-03-22 DIAGNOSIS — T451X5A Adverse effect of antineoplastic and immunosuppressive drugs, initial encounter: Secondary | ICD-10-CM | POA: Diagnosis not present

## 2023-03-22 DIAGNOSIS — Z5112 Encounter for antineoplastic immunotherapy: Secondary | ICD-10-CM | POA: Diagnosis not present

## 2023-03-22 DIAGNOSIS — Z7952 Long term (current) use of systemic steroids: Secondary | ICD-10-CM | POA: Diagnosis not present

## 2023-03-22 DIAGNOSIS — Z7901 Long term (current) use of anticoagulants: Secondary | ICD-10-CM | POA: Diagnosis not present

## 2023-03-22 DIAGNOSIS — Z5111 Encounter for antineoplastic chemotherapy: Secondary | ICD-10-CM | POA: Diagnosis not present

## 2023-03-22 DIAGNOSIS — Z79630 Long term (current) use of alkylating agent: Secondary | ICD-10-CM | POA: Diagnosis not present

## 2023-03-22 DIAGNOSIS — Z79633 Long term (current) use of mitotic inhibitor: Secondary | ICD-10-CM | POA: Diagnosis not present

## 2023-03-22 DIAGNOSIS — E860 Dehydration: Secondary | ICD-10-CM | POA: Diagnosis not present

## 2023-03-22 DIAGNOSIS — C55 Malignant neoplasm of uterus, part unspecified: Secondary | ICD-10-CM | POA: Diagnosis not present

## 2023-03-22 DIAGNOSIS — Z79899 Other long term (current) drug therapy: Secondary | ICD-10-CM | POA: Diagnosis not present

## 2023-03-22 DIAGNOSIS — Z79620 Long term (current) use of immunosuppressive biologic: Secondary | ICD-10-CM | POA: Diagnosis not present

## 2023-03-22 LAB — T4: T4, Total: 10.2 ug/dL (ref 4.5–12.0)

## 2023-03-22 MED ORDER — CYANOCOBALAMIN 1000 MCG/ML IJ SOLN: 1000.0000 ug | Freq: Once | INTRAMUSCULAR | Status: DC

## 2023-03-22 MED ORDER — MAGNESIUM SULFATE 4 GM/100ML IV SOLN
4.0000 g | Freq: Once | INTRAVENOUS | Status: AC
  Administered 2023-03-22: 4 g via INTRAVENOUS
  Filled 2023-03-22: qty 100

## 2023-03-22 MED ORDER — SODIUM CHLORIDE 0.9 % IV SOLN: Freq: Once | INTRAVENOUS | Status: AC

## 2023-03-22 MED ORDER — DEXAMETHASONE 4 MG PO TABS
4.0000 mg | ORAL_TABLET | Freq: Once | ORAL | Status: AC
  Administered 2023-03-22: 4 mg via ORAL
  Filled 2023-03-22: qty 1

## 2023-03-22 MED ORDER — FAMOTIDINE 20 MG PO TABS
20.0000 mg | ORAL_TABLET | Freq: Once | ORAL | Status: AC
  Administered 2023-03-22: 20 mg via ORAL
  Filled 2023-03-22: qty 1

## 2023-03-22 MED ORDER — HEPARIN SOD (PORK) LOCK FLUSH 100 UNIT/ML IV SOLN
500.0000 [IU] | Freq: Once | INTRAVENOUS | Status: AC
  Administered 2023-03-22: 500 [IU]

## 2023-03-22 MED ORDER — SODIUM CHLORIDE 0.9% FLUSH
10.0000 mL | Freq: Once | INTRAVENOUS | Status: AC
  Administered 2023-03-22: 10 mL

## 2023-03-22 NOTE — Patient Instructions (Signed)
Hypomagnesemia Hypomagnesemia is a condition in which the level of magnesium in the blood is too low. Magnesium is a mineral that is found in many foods. It is used in many different processes in the body. Hypomagnesemia can affect every organ in the body. In severe cases, it can cause life-threatening problems. What are the causes? This condition may be caused by: Not getting enough magnesium in your diet or not having enough healthy foods to eat (malnutrition). Problems with magnesium absorption in the intestines. Dehydration. Excessive use of alcohol. Vomiting. Severe or long-term (chronic) diarrhea. Some medicines, including medicines that make you urinate more often (diuretics). Certain diseases, such as kidney disease, diabetes, celiac disease, and overactive thyroid. What are the signs or symptoms? Symptoms of this condition include: Loss of appetite, nausea, and vomiting. Involuntary shaking or trembling of a body part (tremor). Muscle weakness or tingling in the arms and legs. Sudden tightening of muscles (muscle spasms). Confusion. Psychiatric issues, such as: Depression and irritability. Psychosis. A feeling of fluttering of the heart (palpitations). Seizures. These symptoms are more severe if magnesium levels drop suddenly. How is this diagnosed? This condition may be diagnosed based on: Your symptoms and medical history. A physical exam. Blood and urine tests. How is this treated? Treatment depends on the cause and the severity of the condition. It may be treated by: Taking a magnesium supplement. This can be taken in pill form. If the condition is severe, magnesium is usually given through an IV. Making changes to your diet. You may be directed to eat foods that have a lot of magnesium, such as green leafy vegetables, peas, beans, and nuts. Not drinking alcohol. If you are struggling not to drink, ask your health care provider for help. Follow these instructions at  home: Eating and drinking     Make sure that your diet includes foods with magnesium. Foods that have a lot of magnesium in them include: Green leafy vegetables, such as spinach and broccoli. Beans and peas. Nuts and seeds, such as almonds and sunflower seeds. Whole grains, such as whole grain bread and fortified cereals. Drink fluids that contain salts and minerals (electrolytes), such as sports drinks, when you are active. Do not drink alcohol. General instructions Take over-the-counter and prescription medicines only as told by your health care provider. Take magnesium supplements as directed if your health care provider tells you to take them. Have your magnesium levels monitored as told by your health care provider. Keep all follow-up visits. This is important. Contact a health care provider if: You get worse instead of better. Your symptoms return. Get help right away if: You develop severe muscle weakness. You have trouble breathing. You feel that your heart is racing. These symptoms may represent a serious problem that is an emergency. Do not wait to see if the symptoms will go away. Get medical help right away. Call your local emergency services (911 in the [place] Do not drive yourself to the hospital. Summary Hypomagnesemia is a condition in which the level of magnesium in the blood is too low. Hypomagnesemia can affect every organ in the body. Treatment may include eating more foods that contain magnesium, taking magnesium supplements, and not drinking alcohol. Have your magnesium levels monitored as told by your health care provider. This information is not intended to replace advice given to you by your health care provider. Make sure you discuss any questions you have with your health care provider. Document Revised: 03/13/2021 Document Reviewed: 03/13/2021 Elsevier Patient Education  2024 [employer]

## 2023-03-23 LAB — TYPE AND SCREEN: Antibody Screen: NEGATIVE

## 2023-03-23 LAB — BPAM RBC
Blood Product Expiration Date: 202406202359
ISSUE DATE / TIME: 202405241516
Unit Type and Rh: 5100

## 2023-03-24 ENCOUNTER — Encounter: Payer: Self-pay | Admitting: Hematology and Oncology

## 2023-03-25 ENCOUNTER — Ambulatory Visit (INDEPENDENT_AMBULATORY_CARE_PROVIDER_SITE_OTHER): Payer: Medicare HMO | Admitting: Student

## 2023-03-25 ENCOUNTER — Ambulatory Visit (HOSPITAL_COMMUNITY)
Admission: RE | Admit: 2023-03-25 | Discharge: 2023-03-25 | Disposition: A | Discharge status: Home / Self Care | Payer: Medicare HMO | Source: Ambulatory Visit | Attending: Internal Medicine | Admitting: Internal Medicine

## 2023-03-25 ENCOUNTER — Telehealth: Payer: Self-pay | Admitting: Internal Medicine

## 2023-03-25 ENCOUNTER — Telehealth: Payer: Self-pay

## 2023-03-25 ENCOUNTER — Other Ambulatory Visit: Payer: Self-pay | Admitting: Hematology and Oncology

## 2023-03-25 ENCOUNTER — Other Ambulatory Visit: Payer: Self-pay

## 2023-03-25 ENCOUNTER — Encounter: Payer: Self-pay | Admitting: Student

## 2023-03-25 VITALS — BP 148/68 | HR 82 | Temp 98.3°F | Ht 63.0 in | Wt 176.7 lb

## 2023-03-25 DIAGNOSIS — Z885 Allergy status to narcotic agent status: Secondary | ICD-10-CM | POA: Diagnosis not present

## 2023-03-25 DIAGNOSIS — R110 Nausea: Secondary | ICD-10-CM | POA: Diagnosis not present

## 2023-03-25 DIAGNOSIS — Z7901 Long term (current) use of anticoagulants: Secondary | ICD-10-CM | POA: Diagnosis not present

## 2023-03-25 DIAGNOSIS — Z7952 Long term (current) use of systemic steroids: Secondary | ICD-10-CM | POA: Diagnosis not present

## 2023-03-25 DIAGNOSIS — Z5112 Encounter for antineoplastic immunotherapy: Secondary | ICD-10-CM | POA: Diagnosis not present

## 2023-03-25 DIAGNOSIS — I480 Paroxysmal atrial fibrillation: Secondary | ICD-10-CM | POA: Diagnosis not present

## 2023-03-25 DIAGNOSIS — T451X5A Adverse effect of antineoplastic and immunosuppressive drugs, initial encounter: Secondary | ICD-10-CM

## 2023-03-25 DIAGNOSIS — D6481 Anemia due to antineoplastic chemotherapy: Secondary | ICD-10-CM | POA: Diagnosis not present

## 2023-03-25 DIAGNOSIS — Z888 Allergy status to other drugs, medicaments and biological substances status: Secondary | ICD-10-CM | POA: Diagnosis not present

## 2023-03-25 DIAGNOSIS — E878 Other disorders of electrolyte and fluid balance, not elsewhere classified: Secondary | ICD-10-CM | POA: Diagnosis not present

## 2023-03-25 DIAGNOSIS — Z5111 Encounter for antineoplastic chemotherapy: Secondary | ICD-10-CM | POA: Diagnosis not present

## 2023-03-25 DIAGNOSIS — Z79899 Other long term (current) drug therapy: Secondary | ICD-10-CM | POA: Diagnosis not present

## 2023-03-25 DIAGNOSIS — R971 Elevated cancer antigen 125 [CA 125]: Secondary | ICD-10-CM | POA: Diagnosis not present

## 2023-03-25 DIAGNOSIS — Z880 Allergy status to penicillin: Secondary | ICD-10-CM | POA: Diagnosis not present

## 2023-03-25 DIAGNOSIS — R7989 Other specified abnormal findings of blood chemistry: Secondary | ICD-10-CM | POA: Diagnosis not present

## 2023-03-25 DIAGNOSIS — Z79630 Long term (current) use of alkylating agent: Secondary | ICD-10-CM | POA: Diagnosis not present

## 2023-03-25 DIAGNOSIS — Z79633 Long term (current) use of mitotic inhibitor: Secondary | ICD-10-CM | POA: Diagnosis not present

## 2023-03-25 DIAGNOSIS — C55 Malignant neoplasm of uterus, part unspecified: Secondary | ICD-10-CM | POA: Diagnosis not present

## 2023-03-25 DIAGNOSIS — E860 Dehydration: Secondary | ICD-10-CM | POA: Diagnosis not present

## 2023-03-25 DIAGNOSIS — Z79620 Long term (current) use of immunosuppressive biologic: Secondary | ICD-10-CM | POA: Diagnosis not present

## 2023-03-25 NOTE — Telephone Encounter (Signed)
Called patient, she states she has been having palpations since this morning- she states that she is not having any other symptoms, no dizziness, no shortness of breath, she is very concerned with this. She does have a history of PAF- and is overdue for a follow up visit with primary MD. Patient has appointment with PCP today at 2:15 PM, demanded appointment here today. Advised I did not have anything for today. However I did have something tomorrow with DOD as patient was upset we could not see her today, however at the time she was not having symptoms and did not have vital readings, I did wait for patient to get vitals to see if DOD consult was needed (see below)- patient took this appointment advised if she did not need this she could call back to cancel. Patient verbalized understanding.   She did check BP and HR on the phone- it was 155/86 HR 88. Patient does continue to take her medications on her list including Xarelto.

## 2023-03-25 NOTE — Telephone Encounter (Signed)
Returned her call. She is having heart palpations and thinks that it is from Tramadol Rx. She called cardiology Dr. Rennis Golden and they are unable to see her today but may see her tomorrow if needed. She will see PCP today at 2:15 pm for palpitations.  Told her B12 injections are only given on treatment days. Will help her schedule CT scan on 6/12. She verbalized understanding and appreciated the call.

## 2023-03-25 NOTE — Patient Instructions (Addendum)
Today we discussed the following:  1) Fast heartbeat  We are obtaining blood work today to determine if there is an abnormality that could explain your symptoms. We will call you with the results tomorrow. In the meantime, please follow up with the heart doctor tomorrow as planned. Additionally, you may use your Dilaudid instead of the tramadol for pain.   Please follow-up with your heart doctor and your cancer doctor as scheduled.  Please notify if you continue to experience the symptoms with chest pain or shortness of breath.  Please follow-up in 3 months with your PCP

## 2023-03-25 NOTE — Assessment & Plan Note (Addendum)
Patient has hx of PAF and presents after an episode of heart palpitations that began this morning. She states she felt her heart racing for approximately 2 hours with no associated chest pain, diaphoresis, shortness of breath, or dizziness. During the episode, she had a heart rate of 88 and a systolic blood pressure in the 150s. She is currently undergoing chemotherapy and on PRN dilaudid and tramadol. She is concerned her symptoms may be due to the 50 mg of tramadol she took last night and this morning. Reports she had a similar episodes in 2023 following a hysterectomy and bilateral salpingo-oophorectomy when she was initially diagnosed with PAF. She believes she was given tramadol at that time and is concerned the medication serves as a trigger for her episodes. She is currently asymptomatic. On exam, heart is RRR. EKG reveals NSR with HR 70 bmp. She is compliant on her diltiazem 120 mg and Xarelto 20 mg daily. Cause of patient's episode could be multifactorial. Her CBC 4 days ago revealed HGB of 7.9 when she underwent transfusion. Her anemia could be contributing her the palpitations. Additionally, she had Mg of 0.9 making electrolyte abnormality a possible trigger. Poor PO intake is also a possible cause as patient did not have breakfast and has had a decreased appetite. She has been staying hydrated. She has not been especially anxious and her T4 several days ago was within normal range making thyroid abnormality less likely.She did have coffee this morning, but usually drinks caffeine. Will obtain CBC, BMP and Mg today to evaluate anemia and electrolyte abnormality as possible causes In the meantime, informed patient to take her Dilaudid instead of tramadol as needed for pain. She does have follow up with cardiology tomorrow.   > CBC > BMP > Mg > Switch from PRN tramadol to dilaudid > Cardiology follow up tomorrow

## 2023-03-25 NOTE — Telephone Encounter (Signed)
Patient c/o Palpitations:  High priority if patient c/o lightheadedness, shortness of breath, or chest pain  How long have you had palpitations/irregular HR/ Afib? Are you having the symptoms now? Started this morning   Are you currently experiencing lightheadedness, SOB or CP? No   Do you have a history of afib (atrial fibrillation) or irregular heart rhythm? Yes   Have you checked your BP or HR? (document readings if available): No   Are you experiencing any other symptoms? No   Legs were hurting from chemo treatment so she reports she took tramadol 50 mg's yesterday evening and again this morning. She believes this is the cause due to a nurse in the hospital advising her this medication could cause this. Please advise.

## 2023-03-25 NOTE — Progress Notes (Deleted)
Cardiology Office Note   Date:  03/25/2023   ID:  Sherri, Gill 31-Oct-1950, MRN [id number]  PCP:  [name], MD  Cardiologist:   Chrystie Nose, MD Referring:  ***  No chief complaint on file.     History of Present Illness: Sherri Gill is a 72 y.o. female who presents for evaluation of atrial fibrillation. She has been treated for endometrial cancer. ***   She is followed by Dr. Rennis Golden for treatment of atrial fib with RVR.  Echo demonstrated a normal EF.   She was seen by her PCP yesterday.  She was in normal rhythm when she was seen.    ***      heart palpitations that began this morning. She states she felt her heart racing for approximately 2 hours with no associated chest pain, diaphoresis, shortness of breath, or dizziness. During the episode, she had a heart rate of 88 and a systolic blood pressure in the 150s. She is currently undergoing chemotherapy and on PRN dilaudid and tramadol. She is concerned her symptoms may be due to the 50 mg of tramadol she took last night and this morning. Reports she had a similar episodes in 2023 following a hysterectomy and bilateral salpingo-oophorectomy when she was initially diagnosed with PAF. She believes she was given tramadol at that time and is concerned the medication serves as a trigger for her episodes. She is currently asymptomatic. On exam, heart is RRR. EKG reveals NSR with HR 70 bmp. She is compliant on her diltiazem 120 mg and Xarelto 20 mg daily. Cause of patient's episode could be multifactorial. Her CBC 4 days ago revealed HGB of 7.9 when she underwent transfusion. Her anemia could be contributing her the palpitations. Additionally, she had Mg of 0.9 making electrolyte abnormality a possible trigger. Poor PO intake is also a possible cause as patient did not have breakfast and has had a decreased appetite. She has been staying hydrated. She has not been especially anxious and her T4 several days ago was within  normal range making thyroid abnormality less likely.She did have coffee this morning, but usually drinks caffeine. Will obtain CBC, BMP and Mg today to evaluate anemia and electrolyte abnormality as possible causes In the meantime, informed patient to take her Dilaudid instead of tramadol as needed for pain. She does have follow up with cardiology tomorrow.    with a past medial history significant for atrial fibrillation, dyslipidemia, GERD, hypertension and diabetes with neuropathy who was recently hospitalized with abdominal pain and ascites and found to have endometrial cancer after paracentesis.  This was complicated by A-fib with RVR.  She was ultimately rate controlled, but not anticoagulated due to anemia requiring transfusion.  Echocardiogram showed EF of 60 to 65% with a trivial posterior pericardial effusion and normal biatrial size.  Ultimately she was placed on Xarelto and has recovered from her surgery.  She had undergone extensive chemotherapy since then.  She has concerns about being on long-term anticoagulation and when she saw Robin Searing, NP, he placed a monitor to evaluate her burden of atrial fibrillation.  I personally reviewed the monitor which showed that she is still having some atrial fibrillation but a low burden less than 1%.  She was on lower dose Xarelto but this was increased due to improvement in renal function to 20 mg daily.  I advised her to stay on this indefinitely.  For some reason she was on dual calcium channel blocker therapy (amlodipine  and diltiazem.  I advise stopping the amlodipine as typically we do not use 2 different calcium channel blockers, but subsequently her blood pressure has gone higher.  She reports also some breakthrough palpitations.   with a past medial history significant for atrial fibrillation, dyslipidemia, GERD, hypertension and diabetes with neuropathy who was recently hospitalized with abdominal pain and ascites and found to have endometrial cancer  after paracentesis.  This was complicated by A-fib with RVR.  She was ultimately rate controlled, but not anticoagulated due to anemia requiring transfusion.  Echocardiogram showed EF of 60 to 65% with a trivial posterior pericardial effusion and normal biatrial size.  Ultimately she was placed on Xarelto and has recovered from her surgery.  She had undergone extensive chemotherapy since then.  She has concerns about being on long-term anticoagulation and when she saw Robin Searing, NP, he placed a monitor to evaluate her burden of atrial fibrillation.  I personally reviewed the monitor which showed that she is still having some atrial fibrillation but a low burden less than 1%.  She was on lower dose Xarelto but this was increased due to improvement in renal function to 20 mg daily.  I advised her to stay on this indefinitely.  For some reason she was on dual calcium channel blocker therapy (amlodipine and diltiazem.  I advise stopping the amlodipine as typically we do not use 2 different calcium channel blockers, but subsequently her blood pressure has gone higher.  She reports also some breakthrough palpitations.     Past Medical History:  Diagnosis Date   AKI (acute kidney injury) (HCC)    History of   Ascites    09/2021 paracentesis with almost 3 L of fluid 12/2021 Paracentesis performed with 2.8 L   Carpal tunnel syndrome 02/11/2014   Chronic anemia 08/29/2012   Need colonoscopy or report. Need iron panel.    Dyslipidemia 06/30/2007   Dyspnea    Essential hypertension, benign 06/30/2007   GERD 06/30/2007   History of blood transfusion    History of fatty infiltration of liver    History of migraine    Hx of Herpes simplex meningitis 2015   Also noted to have primary empty sella on imaging at this admission   Insomnia 06/11/2012   Major depressive disorder, recurrent episode, moderate with anxious distress (HCC) 06/30/2007   Nausea and vomiting 01/13/2022   Obesity, BMI 35-40 06/11/2012    Peripheral neuropathy 2/2 T2DM 12/28/2009   Primary empty sella syndrome (HCC) 2015   Noted on imaging during hospitalization for herpes meningitis; no pituitary mass, no hormone w/u at that time, hormonally asymptomatic   Type 2 diabetes mellitus with neurological complications (HCC) 06/30/2007    Past Surgical History:  Procedure Laterality Date   COLONOSCOPY W/ POLYPECTOMY  06/28/2004   DENTAL SURGERY     DILATATION & CURETTAGE/HYSTEROSCOPY WITH MYOSURE N/A 01/15/2022   Procedure: DILATATION & CURETTAGE/HYSTEROSCOPY WITH MYOSURE;  Surgeon: Carver Fila, MD;  Location: [facility];  Service: Gynecology;  Laterality: N/A;  DO NOT OPEN HYSTEROSCOPY KIT   HYSTERECTOMY ABDOMINAL WITH SALPINGO-OOPHORECTOMY Bilateral 04/09/2022   Procedure: HYSTERECTOMY ABDOMINAL BILATERAL SALPINGO OOPHORECTOMY WITH OMENTECTOMY ,DEBULKING;  Surgeon: Carver Fila, MD;  Location: [facility];  Service: Gynecology;  Laterality: Bilateral;   IR IMAGING GUIDED PORT INSERTION  01/18/2022   LAPAROSCOPY N/A 04/09/2022   Procedure: LAPAROSCOPY DIAGNOSTIC;  Surgeon: Carver Fila, MD;  Location: [facility];  Service: Gynecology;  Laterality: N/A;   OPERATIVE ULTRASOUND N/A 01/15/2022  Procedure: OPERATIVE ULTRASOUND;  Surgeon: Carver Fila, MD;  Location: [facility];  Service: Gynecology;  Laterality: N/A;   port a cath placement     TUBAL LIGATION     age 58     Current Outpatient Medications  Medication Sig Dispense Refill   acetaminophen (TYLENOL) 500 MG tablet Take 1,000 mg by mouth every 6 (six) hours as needed for mild pain.     albuterol (VENTOLIN HFA) 108 (90 Base) MCG/ACT inhaler Inhale 2 puffs into the lungs every 4 (four) hours as needed for wheezing or shortness of breath. 8 g 0   atorvastatin (LIPITOR) 20 MG tablet Take 1 tablet (20 mg total) by mouth at bedtime. 30 tablet 11   B Complex-C (B-COMPLEX WITH VITAMIN C) tablet Take 1 tablet by mouth in the morning.     Blood Glucose Monitoring Suppl  (ONE TOUCH ULTRA MINI) w/Device KIT Please use as directed. 1 each 0   Cholecalciferol 1000 units capsule Take 1 capsule (1,000 Units total) by mouth daily. 90 capsule 1   dexamethasone (DECADRON) 4 MG tablet Take 1 tablet (4 mg total) by mouth daily. 30 tablet 6   diltiazem (CARDIZEM CD) 120 MG 24 hr capsule Take 1 capsule (120 mg total) by mouth daily. 30 capsule 11   dorzolamide-timolol (COSOPT) 22.3-6.8 MG/ML ophthalmic solution Place 1 drop into both eyes 2 (two) times daily.     glucose blood (ONE TOUCH ULTRA TEST) test strip Use as instructed 100 each 1   hydrALAZINE (APRESOLINE) 25 MG tablet TAKE 1 TABLET BY MOUTH THREE TIMES DAILY 90 tablet 3   HYDROmorphone (DILAUDID) 2 MG tablet Take 1 tablet (2 mg) by mouth 3 times daily as needed for severe pain. 30 tablet 0   Insulin Pen Needle 32G X 4 MM MISC 1 each by Does not apply route daily. 100 each 3   latanoprost (XALATAN) 0.005 % ophthalmic solution Place 1 drop into both eyes at bedtime.     lidocaine-prilocaine (EMLA) cream Apply to affected area once as directed 30 g 3   losartan (COZAAR) 100 MG tablet Take 1 tablet (100 mg total) by mouth at bedtime. 90 tablet 3   magnesium oxide (MAG-OX) 400 (240 Mg) MG tablet Take 1 tablet (400 mg total) by mouth 2 (two) times daily. 60 tablet 1   metFORMIN (GLUCOPHAGE) 1000 MG tablet Take 1 tablet (1,000 mg total) by mouth 2 (two) times daily. 180 tablet 3   ondansetron (ZOFRAN) 8 MG tablet Take 1 tablet (8 mg total) by mouth every 8 (eight) hours as needed for nausea or vomiting. Start on the third day after chemotherapy. 30 tablet 1   ONETOUCH DELICA LANCETS 33G MISC Please use as directed. 100 each 0   pantoprazole (PROTONIX) 40 MG tablet Take 1 tablet by mouth daily. 360 tablet 0   polyethylene glycol powder (GLYCOLAX/MIRALAX) 17 GM/SCOOP powder Take 17 g by mouth daily. 238 g 1   prochlorperazine (COMPAZINE) 10 MG tablet Take 1 tablet (10 mg total) by mouth every 6 (six) hours as needed for  nausea or vomiting. 30 tablet 1   rivaroxaban (XARELTO) 20 MG TABS tablet Take 1 tablet (20 mg total) by mouth daily with supper. 90 tablet 1   No current facility-administered medications for this visit.    Allergies:   Codeine sulfate, Penicillins, Percocet [oxycodone-acetaminophen], and Zestril [lisinopril]    Social History:  The patient  reports that she quit smoking about 44 years ago. Her smoking use included  cigarettes. She has a 2.50 pack-year smoking history. She has never used smokeless tobacco. She reports that she does not drink alcohol and does not use drugs.   Family History:  The patient's ***family history includes Breast cancer in her sister and another family member; Dementia in her mother; Liver cancer in her brother; Prostate cancer in her cousin and maternal uncle; Thyroid cancer in her maternal aunt. She was adopted.    ROS:  Please see the history of present illness.   Otherwise, review of systems are positive for {NONE DEFAULTED:18576}.   All other systems are reviewed and negative.    PHYSICAL EXAM: VS:  There were no vitals taken for this visit. , BMI There is no height or weight on file to calculate BMI. GENERAL:  Well appearing HEENT:  Pupils equal round and reactive, fundi not visualized, oral mucosa unremarkable NECK:  No jugular venous distention, waveform within normal limits, carotid upstroke brisk and symmetric, no bruits, no thyromegaly LYMPHATICS:  No cervical, inguinal adenopathy LUNGS:  Clear to auscultation bilaterally BACK:  No CVA tenderness CHEST:  Unremarkable HEART:  PMI not displaced or sustained,S1 and S2 within normal limits, no S3, no S4, no clicks, no rubs, *** murmurs ABD:  Flat, positive bowel sounds normal in frequency in pitch, no bruits, no rebound, no guarding, no midline pulsatile mass, no hepatomegaly, no splenomegaly EXT:  2 plus pulses throughout, no edema, no cyanosis no clubbing SKIN:  No rashes no nodules NEURO:  Cranial  nerves II through XII grossly intact, motor grossly intact throughout PSYCH:  Cognitively intact, oriented to person place and time    EKG:  EKG {ACTION; IS/IS ZOX:[id number]} ordered today. The ekg ordered today demonstrates ***   Recent Labs: 04/12/2022: B Natriuretic Peptide 98.9 03/21/2023: ALT 7; BUN 9; Creatinine 0.88; Hemoglobin 7.9; Magnesium 0.9; Platelet Count 268; Potassium 3.5; Sodium 142; TSH 5.928    Lipid Panel    Component Value Date/Time   CHOL 171 05/31/2021 1531   TRIG 161 (H) 05/31/2021 1531   HDL 65 05/31/2021 1531   CHOLHDL 2.6 05/31/2021 1531   CHOLHDL 3.2 07/13/2014 1621   VLDL 28 07/13/2014 1621   LDLCALC 79 05/31/2021 1531      Wt Readings from Last 3 Encounters:  03/25/23 176 lb 11.2 oz (80.2 kg)  03/21/23 183 lb 3.2 oz (83.1 kg)  03/19/23 180 lb 1.9 oz (81.7 kg)      Other studies Reviewed: Additional studies/ records that were reviewed today include: ***. Review of the above records demonstrates:  Please see elsewhere in the note.  ***   ASSESSMENT AND PLAN:  ***   Current medicines are reviewed at length with the patient today.  The patient {ACTIONS; HAS/DOES NOT HAVE:19233} concerns regarding medicines.  The following changes have been made:  {PLAN; NO CHANGE:13088:s}  Labs/ tests ordered today include: *** No orders of the defined types were placed in this encounter.    Disposition:   FU with ***    Signed, Rollene Rotunda, MD  03/25/2023 7:53 PM    [facility]

## 2023-03-25 NOTE — Progress Notes (Unsigned)
Subjective:   Patient ID: Sherri Gill female   DOB: 18-Oct-1951 72 y.o.   MRN: [id number]  HPI: [name] is a 72 y.o. female with a PMHx of PAF, uterine cancer (currently undergoing chemotherapy and immunotherapy), HTN, T2DM who presents after an episode of heart palpitation this morning. Please see problem based assessment and plan for further work up and management.   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   Anemia due to antineoplastic chemotherapy 05/28/2022   PAF (paroxysmal atrial fibrillation) (HCC) onset post-op Spring 2023 04/12/2022   Hypomagnesemia 03/08/2022   Chronic nausea 01/15/2022   Uterine cancer (HCC) 01/14/2022   Umbilical hernia 09/28/2021   Radicular pain in left arm 11/10/2020   Routine health maintenance 11/10/2020   Personal history of colonic polyps 11/10/2020   Elevated alkaline phosphatase level 03/17/2016   Iron deficiency anemia 08/29/2012   Obesity (BMI 30.0-34.9) 06/11/2012   Dyslipidemia associated with type 2 diabetes mellitus (HCC) 06/30/2007   Major depressive disorder with single episode, in remission (HCC) 06/30/2007   Essential hypertension 06/30/2007   GERD 06/30/2007   Type 2 diabetes mellitus with neurological complications (HCC) 06/30/2007     Current Outpatient Medications  Medication Sig Dispense Refill   acetaminophen (TYLENOL) 500 MG tablet Take 1,000 mg by mouth every 6 (six) hours as needed for mild pain.     albuterol (VENTOLIN HFA) 108 (90 Base) MCG/ACT inhaler Inhale 2 puffs into the lungs every 4 (four) hours as needed for wheezing or shortness of breath. 8 g 0   atorvastatin (LIPITOR) 20 MG tablet Take 1 tablet (20 mg total) by mouth at bedtime. 30 tablet 11   B Complex-C (B-COMPLEX WITH VITAMIN C) tablet Take 1 tablet by mouth in the morning.     Blood Glucose Monitoring  Suppl (ONE TOUCH ULTRA MINI) w/Device KIT Please use as directed. 1 each 0   Cholecalciferol 1000 units capsule Take 1 capsule (1,000 Units total) by mouth daily. 90 capsule 1   dexamethasone (DECADRON) 4 MG tablet Take 1 tablet (4 mg total) by mouth daily. 30 tablet 6   diltiazem (CARDIZEM CD) 120 MG 24 hr capsule Take 1 capsule (120 mg total) by mouth daily. 30 capsule 11   dorzolamide-timolol (COSOPT) 22.3-6.8 MG/ML ophthalmic solution Place 1 drop into both eyes 2 (two) times daily.     glucose blood (ONE TOUCH ULTRA TEST) test strip Use as instructed 100 each 1   hydrALAZINE (APRESOLINE) 25 MG tablet TAKE 1 TABLET BY MOUTH THREE TIMES DAILY 90 tablet 3   HYDROmorphone (DILAUDID) 2 MG tablet Take 1 tablet (2 mg) by mouth 3 times daily as needed for severe pain. 30 tablet 0   Insulin Pen Needle 32G X 4 MM MISC 1 each by Does not apply route daily. 100 each 3   latanoprost (XALATAN) 0.005 % ophthalmic solution Place 1 drop into both eyes at bedtime.     lidocaine-prilocaine (EMLA) cream Apply to affected area once as directed 30 g 3   losartan (COZAAR) 100 MG tablet Take 1 tablet (100 mg total) by mouth at bedtime. 90 tablet 3   magnesium oxide (MAG-OX) 400 (240 Mg) MG tablet Take 1 tablet (400 mg total) by mouth 2 (two) times daily. 60 tablet 1   metFORMIN (GLUCOPHAGE) 1000 MG tablet Take 1 tablet (1,000 mg total)  by mouth 2 (two) times daily. 180 tablet 3   ondansetron (ZOFRAN) 8 MG tablet Take 1 tablet (8 mg total) by mouth every 8 (eight) hours as needed for nausea or vomiting. Start on the third day after chemotherapy. 30 tablet 1   ONETOUCH DELICA LANCETS 33G MISC Please use as directed. 100 each 0   pantoprazole (PROTONIX) 40 MG tablet Take 1 tablet by mouth daily. 360 tablet 0   polyethylene glycol powder (GLYCOLAX/MIRALAX) 17 GM/SCOOP powder Take 17 g by mouth daily. 238 g 1   prochlorperazine (COMPAZINE) 10 MG tablet Take 1 tablet (10 mg total) by mouth every 6 (six) hours as needed for  nausea or vomiting. 30 tablet 1   rivaroxaban (XARELTO) 20 MG TABS tablet Take 1 tablet (20 mg total) by mouth daily with supper. 90 tablet 1   No current facility-administered medications for this visit.     Review of Systems: Pertinent ROS listed in the A&P. Otherwise negative.   Objective:   Physical Exam: Vitals:   03/25/23 1422 03/25/23 1458  BP: (!) 160/74 (!) 148/68  Pulse: 82   Temp: 98.3 F (36.8 C)   TempSrc: Oral   SpO2: 97%   Weight: 176 lb 11.2 oz (80.2 kg)   Height: 5\' 3"  (1.6 m)    Physical Exam Constitutional:      Appearance: Normal appearance.  HENT:     Head: Normocephalic and atraumatic.  Cardiovascular:     Rate and Rhythm: Normal rate and regular rhythm.  Pulmonary:     Effort: Pulmonary effort is normal.     Breath sounds: Normal breath sounds.  Skin:    General: Skin is warm and dry.  Neurological:     General: No focal deficit present.     Mental Status: She is alert and oriented to person, place, and time.  Psychiatric:        Mood and Affect: Mood normal.        Behavior: Behavior normal.      Assessment & Plan:   PAF (paroxysmal atrial fibrillation) (HCC) onset post-op Spring 2023 Patient has hx of PAF and presents after an episode of heart palpitations that began this morning. She states she felt her heart racing for approximately 2 hours with no associated chest pain, diaphoresis, shortness of breath, or dizziness. During the episode, she had a heart rate of 88 and a systolic blood pressure in the 150s. She is currently undergoing chemotherapy and on PRN dilaudid and tramadol. She is concerned her symptoms may be due to the 50 mg of tramadol she took last night and this morning. Reports she had a similar episodes in 2023 following a hysterectomy and bilateral salpingo-oophorectomy when she was initially diagnosed with PAF. She believes she was given tramadol at that time and is concerned the medication serves as a trigger for her episodes.  She is currently asymptomatic. On exam, heart is RRR. EKG reveals NSR with HR 70 bmp. She is compliant on her diltiazem 120 mg and Xarelto 20 mg daily. Cause of patient's episode could be multifactorial. Her CBC 4 days ago revealed HGB of 7.9 when she underwent transfusion. Her anemia could be contributing her the palpitations. Additionally, she had Mg of 0.9 making electrolyte abnormality a possible trigger. Poor PO intake is also a possible cause as patient did not have breakfast and has had a decreased appetite. She has been staying hydrated. She has not been especially anxious and her T4 several days ago was within  normal range making thyroid abnormality less likely.She did have coffee this morning, but usually drinks caffeine. Will obtain CBC, BMP and Mg today to evaluate anemia and electrolyte abnormality as possible causes In the meantime, informed patient to take her Dilaudid instead of tramadol as needed for pain. She does have follow up with cardiology tomorrow.   > CBC > BMP > Mg > Switch from PRN tramadol to dilaudid > Cardiology follow up tomorrow

## 2023-03-26 ENCOUNTER — Encounter: Payer: Self-pay | Admitting: Hematology and Oncology

## 2023-03-26 ENCOUNTER — Telehealth: Payer: Self-pay

## 2023-03-26 ENCOUNTER — Ambulatory Visit: Payer: Medicare HMO | Admitting: Cardiology

## 2023-03-26 LAB — MAGNESIUM

## 2023-03-26 LAB — CBC
MCH: 26.2 pg — ABNORMAL LOW (ref 26.6–33.0)
MCV: 84 fL (ref 79–97)
RDW: 17 % — ABNORMAL HIGH (ref 11.7–15.4)
WBC: 4.7 10*3/uL (ref 3.4–10.8)

## 2023-03-26 LAB — BMP8+ANION GAP

## 2023-03-26 NOTE — Telephone Encounter (Signed)
She called back and given appt for CT at 6/11 at 8 am at [facility], arrive at 0745, npo 4 hours prior to CT. She verbalized understanding.

## 2023-03-26 NOTE — Progress Notes (Signed)
[facility] Attending  Case discussed with Dr. Nguyen  At the time of the visit.  We reviewed the resident's history and exam and pertinent patient test results.  I agree with the assessment, diagnosis, and plan of care documented in the resident's note. 

## 2023-03-26 NOTE — Telephone Encounter (Signed)
Attempted to call. No answer and unable to leave a voicemail. CT scan scheduled on 6/11 at 8 am at [facility], arrive at [facility] for appt.  Will try to call back later to give appt.

## 2023-03-27 ENCOUNTER — Encounter: Payer: Self-pay | Admitting: Hematology and Oncology

## 2023-03-27 LAB — BMP8+ANION GAP
Anion Gap: 17 mmol/L (ref 10.0–18.0)
BUN/Creatinine Ratio: 14 (ref 12–28)
BUN: 12 mg/dL (ref 8–27)
CO2: 25 mmol/L (ref 20–29)
Calcium: 9.6 mg/dL (ref 8.7–10.3)
Chloride: 98 mmol/L (ref 96–106)
Creatinine, Ser: 0.86 mg/dL (ref 0.57–1.00)
Glucose: 95 mg/dL (ref 70–99)
Potassium: 4.6 mmol/L (ref 3.5–5.2)
Sodium: 140 mmol/L (ref 134–144)

## 2023-03-27 LAB — CBC
Hematocrit: 31.5 % — ABNORMAL LOW (ref 34.0–46.6)
Hemoglobin: 9.8 g/dL — ABNORMAL LOW (ref 11.1–15.9)
MCHC: 31.1 g/dL — ABNORMAL LOW (ref 31.5–35.7)
Platelets: 237 10*3/uL (ref 150–450)
RBC: 3.74 x10E6/uL — ABNORMAL LOW (ref 3.77–5.28)

## 2023-03-28 ENCOUNTER — Other Ambulatory Visit (HOSPITAL_COMMUNITY): Payer: Self-pay

## 2023-03-31 ENCOUNTER — Telehealth: Payer: Self-pay

## 2023-03-31 NOTE — Telephone Encounter (Signed)
Returned her call. Reviewed upcoming appts. PCP sent referral for PT in home. She is just verifying appts. She will call the office back for questions.

## 2023-04-01 DIAGNOSIS — H25813 Combined forms of age-related cataract, bilateral: Secondary | ICD-10-CM | POA: Diagnosis not present

## 2023-04-01 DIAGNOSIS — H401122 Primary open-angle glaucoma, left eye, moderate stage: Secondary | ICD-10-CM | POA: Diagnosis not present

## 2023-04-01 DIAGNOSIS — H40051 Ocular hypertension, right eye: Secondary | ICD-10-CM | POA: Diagnosis not present

## 2023-04-01 DIAGNOSIS — H40033 Anatomical narrow angle, bilateral: Secondary | ICD-10-CM | POA: Diagnosis not present

## 2023-04-01 DIAGNOSIS — E119 Type 2 diabetes mellitus without complications: Secondary | ICD-10-CM | POA: Diagnosis not present

## 2023-04-01 LAB — HM DIABETES EYE EXAM

## 2023-04-08 ENCOUNTER — Ambulatory Visit (HOSPITAL_COMMUNITY)
Admission: RE | Admit: 2023-04-08 | Discharge: 2023-04-08 | Disposition: A | Discharge status: Home / Self Care | Payer: Medicare HMO | Source: Ambulatory Visit | Attending: Hematology and Oncology | Admitting: Hematology and Oncology

## 2023-04-08 DIAGNOSIS — K3189 Other diseases of stomach and duodenum: Secondary | ICD-10-CM | POA: Diagnosis not present

## 2023-04-08 DIAGNOSIS — C55 Malignant neoplasm of uterus, part unspecified: Secondary | ICD-10-CM | POA: Insufficient documentation

## 2023-04-08 MED ORDER — HEPARIN SOD (PORK) LOCK FLUSH 100 UNIT/ML IV SOLN
500.0000 [IU] | Freq: Once | INTRAVENOUS | Status: AC
  Administered 2023-04-08: 500 [IU] via INTRAVENOUS

## 2023-04-08 MED ORDER — IOHEXOL 300 MG/ML  SOLN
100.0000 mL | Freq: Once | INTRAMUSCULAR | Status: AC | PRN
  Administered 2023-04-08: 100 mL via INTRAVENOUS

## 2023-04-08 MED ORDER — HEPARIN SOD (PORK) LOCK FLUSH 100 UNIT/ML IV SOLN
INTRAVENOUS | Status: AC
  Filled 2023-04-08: qty 5

## 2023-04-10 ENCOUNTER — Telehealth: Payer: Self-pay

## 2023-04-10 ENCOUNTER — Ambulatory Visit (HOSPITAL_COMMUNITY): Payer: Medicare HMO

## 2023-04-10 MED FILL — Dexamethasone Sodium Phosphate Inj 100 MG/10ML: INTRAMUSCULAR | Qty: 1 | Status: AC

## 2023-04-10 MED FILL — Fosaprepitant Dimeglumine For IV Infusion 150 MG (Base Eq): INTRAVENOUS | Qty: 5 | Status: AC

## 2023-04-10 NOTE — Telephone Encounter (Signed)
Returned her call. She missed a call from [facility] and is afraid that the office called. Told her that the office did not call and maybe it was a reminder call for appt tomorrow, reviewed appts. She verbalized understanding and appreciated the call.

## 2023-04-11 ENCOUNTER — Inpatient Hospital Stay: Payer: Medicare HMO | Attending: Hematology and Oncology

## 2023-04-11 ENCOUNTER — Inpatient Hospital Stay: Payer: Medicare HMO | Admitting: Dietician

## 2023-04-11 ENCOUNTER — Inpatient Hospital Stay: Payer: Medicare HMO | Admitting: Hematology and Oncology

## 2023-04-11 ENCOUNTER — Inpatient Hospital Stay: Payer: Medicare HMO

## 2023-04-11 ENCOUNTER — Encounter: Payer: Self-pay | Admitting: Hematology and Oncology

## 2023-04-11 ENCOUNTER — Other Ambulatory Visit: Payer: Self-pay

## 2023-04-11 VITALS — BP 150/58 | HR 97 | Resp 18 | Ht 63.0 in | Wt 177.2 lb

## 2023-04-11 DIAGNOSIS — Z79633 Long term (current) use of mitotic inhibitor: Secondary | ICD-10-CM | POA: Insufficient documentation

## 2023-04-11 DIAGNOSIS — Z7952 Long term (current) use of systemic steroids: Secondary | ICD-10-CM | POA: Diagnosis not present

## 2023-04-11 DIAGNOSIS — I517 Cardiomegaly: Secondary | ICD-10-CM | POA: Insufficient documentation

## 2023-04-11 DIAGNOSIS — Z888 Allergy status to other drugs, medicaments and biological substances status: Secondary | ICD-10-CM | POA: Insufficient documentation

## 2023-04-11 DIAGNOSIS — R194 Change in bowel habit: Secondary | ICD-10-CM | POA: Insufficient documentation

## 2023-04-11 DIAGNOSIS — C786 Secondary malignant neoplasm of retroperitoneum and peritoneum: Secondary | ICD-10-CM | POA: Diagnosis not present

## 2023-04-11 DIAGNOSIS — T451X5A Adverse effect of antineoplastic and immunosuppressive drugs, initial encounter: Secondary | ICD-10-CM | POA: Insufficient documentation

## 2023-04-11 DIAGNOSIS — Z885 Allergy status to narcotic agent status: Secondary | ICD-10-CM | POA: Insufficient documentation

## 2023-04-11 DIAGNOSIS — Z880 Allergy status to penicillin: Secondary | ICD-10-CM | POA: Insufficient documentation

## 2023-04-11 DIAGNOSIS — D6481 Anemia due to antineoplastic chemotherapy: Secondary | ICD-10-CM

## 2023-04-11 DIAGNOSIS — C55 Malignant neoplasm of uterus, part unspecified: Secondary | ICD-10-CM | POA: Diagnosis not present

## 2023-04-11 DIAGNOSIS — Z79630 Long term (current) use of alkylating agent: Secondary | ICD-10-CM | POA: Diagnosis not present

## 2023-04-11 DIAGNOSIS — Z7901 Long term (current) use of anticoagulants: Secondary | ICD-10-CM | POA: Diagnosis not present

## 2023-04-11 DIAGNOSIS — I700 Atherosclerosis of aorta: Secondary | ICD-10-CM | POA: Diagnosis not present

## 2023-04-11 DIAGNOSIS — Z5111 Encounter for antineoplastic chemotherapy: Secondary | ICD-10-CM | POA: Diagnosis present

## 2023-04-11 DIAGNOSIS — K429 Umbilical hernia without obstruction or gangrene: Secondary | ICD-10-CM | POA: Insufficient documentation

## 2023-04-11 DIAGNOSIS — Z79899 Other long term (current) drug therapy: Secondary | ICD-10-CM | POA: Insufficient documentation

## 2023-04-11 DIAGNOSIS — Z79631 Long term (current) use of antimetabolite agent: Secondary | ICD-10-CM | POA: Insufficient documentation

## 2023-04-11 DIAGNOSIS — D508 Other iron deficiency anemias: Secondary | ICD-10-CM

## 2023-04-11 DIAGNOSIS — Z5112 Encounter for antineoplastic immunotherapy: Secondary | ICD-10-CM | POA: Diagnosis present

## 2023-04-11 DIAGNOSIS — R188 Other ascites: Secondary | ICD-10-CM | POA: Diagnosis not present

## 2023-04-11 DIAGNOSIS — R971 Elevated cancer antigen 125 [CA 125]: Secondary | ICD-10-CM | POA: Diagnosis not present

## 2023-04-11 LAB — CBC WITH DIFFERENTIAL (CANCER CENTER ONLY)
Abs Immature Granulocytes: 0.08 10*3/uL — ABNORMAL HIGH (ref 0.00–0.07)
Basophils Absolute: 0 10*3/uL (ref 0.0–0.1)
Basophils Relative: 0 %
Eosinophils Absolute: 0 10*3/uL (ref 0.0–0.5)
Eosinophils Relative: 0 %
HCT: 29.2 % — ABNORMAL LOW (ref 36.0–46.0)
Hemoglobin: 9.6 g/dL — ABNORMAL LOW (ref 12.0–15.0)
Immature Granulocytes: 1 %
Lymphocytes Relative: 12 %
Lymphs Abs: 1 10*3/uL (ref 0.7–4.0)
MCH: 27.3 pg (ref 26.0–34.0)
MCHC: 32.9 g/dL (ref 30.0–36.0)
MCV: 83 fL (ref 80.0–100.0)
Monocytes Absolute: 0.3 10*3/uL (ref 0.1–1.0)
Monocytes Relative: 3 %
Neutro Abs: 7.4 10*3/uL (ref 1.7–7.7)
Neutrophils Relative %: 84 %
Platelet Count: 307 10*3/uL (ref 150–400)
RBC: 3.52 MIL/uL — ABNORMAL LOW (ref 3.87–5.11)
RDW: 17.7 % — ABNORMAL HIGH (ref 11.5–15.5)
WBC Count: 8.8 10*3/uL (ref 4.0–10.5)
nRBC: 0 % (ref 0.0–0.2)

## 2023-04-11 LAB — CMP (CANCER CENTER ONLY)
ALT: 7 U/L (ref 0–44)
AST: 10 U/L — ABNORMAL LOW (ref 15–41)
Albumin: 4 g/dL (ref 3.5–5.0)
Alkaline Phosphatase: 150 U/L — ABNORMAL HIGH (ref 38–126)
Anion gap: 9 (ref 5–15)
BUN: 15 mg/dL (ref 8–23)
CO2: 24 mmol/L (ref 22–32)
Calcium: 9.9 mg/dL (ref 8.9–10.3)
Chloride: 105 mmol/L (ref 98–111)
Creatinine: 0.89 mg/dL (ref 0.44–1.00)
GFR, Estimated: >60 mL/min (ref >60)
Glucose, Bld: 194 mg/dL — ABNORMAL HIGH (ref 70–99)
Potassium: 4.1 mmol/L (ref 3.5–5.1)
Sodium: 138 mmol/L (ref 135–145)
Total Bilirubin: 0.3 mg/dL (ref 0.3–1.2)
Total Protein: 7.1 g/dL (ref 6.5–8.1)

## 2023-04-11 LAB — SAMPLE TO BLOOD BANK

## 2023-04-11 MED ORDER — SODIUM CHLORIDE 0.9 % IV SOLN
175.0000 mg/m2 | Freq: Once | INTRAVENOUS | Status: AC
  Administered 2023-04-11: 336 mg via INTRAVENOUS
  Filled 2023-04-11: qty 56

## 2023-04-11 MED ORDER — CYANOCOBALAMIN 1000 MCG/ML IJ SOLN
1000.0000 ug | Freq: Once | INTRAMUSCULAR | Status: AC
  Administered 2023-04-11: 1000 ug via INTRAMUSCULAR
  Filled 2023-04-11: qty 1

## 2023-04-11 MED ORDER — SODIUM CHLORIDE 0.9 % IV SOLN
378.8000 mg | Freq: Once | INTRAVENOUS | Status: AC
  Administered 2023-04-11: 380 mg via INTRAVENOUS
  Filled 2023-04-11: qty 38

## 2023-04-11 MED ORDER — SODIUM CHLORIDE 0.9 % IV SOLN
150.0000 mg | Freq: Once | INTRAVENOUS | Status: AC
  Administered 2023-04-11: 150 mg via INTRAVENOUS
  Filled 2023-04-11: qty 150

## 2023-04-11 MED ORDER — PALONOSETRON HCL INJECTION 0.25 MG/5ML
0.2500 mg | Freq: Once | INTRAVENOUS | Status: AC
  Administered 2023-04-11: 0.25 mg via INTRAVENOUS
  Filled 2023-04-11: qty 5

## 2023-04-11 MED ORDER — CETIRIZINE HCL 10 MG/ML IV SOLN
10.0000 mg | Freq: Once | INTRAVENOUS | Status: AC
  Administered 2023-04-11: 10 mg via INTRAVENOUS
  Filled 2023-04-11: qty 1

## 2023-04-11 MED ORDER — SODIUM CHLORIDE 0.9% FLUSH
10.0000 mL | INTRAVENOUS | Status: DC | PRN
  Administered 2023-04-11: 10 mL

## 2023-04-11 MED ORDER — HEPARIN SOD (PORK) LOCK FLUSH 100 UNIT/ML IV SOLN
500.0000 [IU] | Freq: Once | INTRAVENOUS | Status: AC | PRN
  Administered 2023-04-11: 500 [IU]

## 2023-04-11 MED ORDER — FAMOTIDINE IN NACL 20-0.9 MG/50ML-% IV SOLN
20.0000 mg | Freq: Once | INTRAVENOUS | Status: AC
  Administered 2023-04-11: 20 mg via INTRAVENOUS
  Filled 2023-04-11: qty 50

## 2023-04-11 MED ORDER — SODIUM CHLORIDE 0.9 % IV SOLN
10.0000 mg | Freq: Once | INTRAVENOUS | Status: AC
  Administered 2023-04-11: 10 mg via INTRAVENOUS
  Filled 2023-04-11: qty 10

## 2023-04-11 MED ORDER — SODIUM CHLORIDE 0.9 % IV SOLN: Freq: Once | INTRAVENOUS | Status: AC

## 2023-04-11 MED ORDER — SODIUM CHLORIDE 0.9 % IV SOLN
500.0000 mg | Freq: Once | INTRAVENOUS | Status: AC
  Administered 2023-04-11: 500 mg via INTRAVENOUS
  Filled 2023-04-11: qty 10

## 2023-04-11 MED ORDER — SODIUM CHLORIDE 0.9% FLUSH
10.0000 mL | Freq: Once | INTRAVENOUS | Status: AC
  Administered 2023-04-11: 10 mL

## 2023-04-11 NOTE — Progress Notes (Signed)
Per [name] MD, ok to premed pt with CMP pending.

## 2023-04-11 NOTE — Progress Notes (Signed)
[facility] OFFICE PROGRESS NOTE  Patient Care Team: [name], MD as PCP - General (Internal Medicine) Rennis Golden Lisette Abu, MD as PCP - Cardiology (Cardiology) Jodelle Gross, RN as [facility] Care Management  ASSESSMENT & PLAN:  Uterine cancer [facility]) I have reviewed multiple imaging studies with the patient Overall, she has positive response to therapy She still have some mild persistent abdominal carcinomatosis affecting her regular bowel habits but overall improving I recommend we continue treatment for a few more cycles with plan to repeat imaging study again in September  Bowel habit changes She has frequent irregular bowel habits with intermittent loose stool and frequent bowel movement I recommend the patient to reduce MiraLAX intake and to avoid taking Imodium if possible I suspect the changes in her CT scan is still consistent with mild abdominal carcinomatosis but I anticipate improvement with more chemotherapy  Anemia due to antineoplastic chemotherapy She has multifactorial anemia She will continue vitamin B12 Injection  No orders of the defined types were placed in this encounter.   All questions were answered. The patient knows to call the clinic with any problems, questions or concerns. The total time spent in the appointment was 30 minutes encounter with patients including review of chart and various tests results, discussions about plan of care and coordination of care plan   Artis Delay, MD 04/11/2023 10:09 AM  INTERVAL HISTORY: Please see below for problem oriented charting. she returns for treatment follow-up and review test results She tolerated last cycle therapy better Denies peripheral neuropathy She continues to have intermittent abdominal discomfort and frequent loose bowel movement She has been taking Imodium and modify her diet No recent nausea vomiting  REVIEW OF SYSTEMS:   Constitutional: Denies fevers,  chills or abnormal weight loss Eyes: Denies blurriness of vision Ears, nose, mouth, throat, and face: Denies mucositis or sore throat Respiratory: Denies cough, dyspnea or wheezes Cardiovascular: Denies palpitation, chest discomfort or lower extremity swelling Gastrointestinal:  Denies nausea, heartburn or change in bowel habits Skin: Denies abnormal skin rashes Lymphatics: Denies new lymphadenopathy or easy bruising Neurological:Denies numbness, tingling or new weaknesses Behavioral/Psych: Mood is stable, no new changes  All other systems were reviewed with the patient and are negative.  I have reviewed the past medical history, past surgical history, social history and family history with the patient and they are unchanged from previous note.  ALLERGIES:  is allergic to codeine sulfate, penicillins, percocet [oxycodone-acetaminophen], and zestril [lisinopril].  MEDICATIONS:  Current Outpatient Medications  Medication Sig Dispense Refill   acetaminophen (TYLENOL) 500 MG tablet Take 1,000 mg by mouth every 6 (six) hours as needed for mild pain.     albuterol (VENTOLIN HFA) 108 (90 Base) MCG/ACT inhaler Inhale 2 puffs into the lungs every 4 (four) hours as needed for wheezing or shortness of breath. 8 g 0   atorvastatin (LIPITOR) 20 MG tablet Take 1 tablet (20 mg total) by mouth at bedtime. 30 tablet 11   B Complex-C (B-COMPLEX WITH VITAMIN C) tablet Take 1 tablet by mouth in the morning.     Blood Glucose Monitoring Suppl (ONE TOUCH ULTRA MINI) w/Device KIT Please use as directed. 1 each 0   Cholecalciferol 1000 units capsule Take 1 capsule (1,000 Units total) by mouth daily. 90 capsule 1   dexamethasone (DECADRON) 4 MG tablet Take 1 tablet (4 mg total) by mouth daily. 30 tablet 6   diltiazem (CARDIZEM CD) 120 MG 24 hr capsule Take 1 capsule (120 mg  total) by mouth daily. 30 capsule 11   dorzolamide-timolol (COSOPT) 22.3-6.8 MG/ML ophthalmic solution Place 1 drop into both eyes 2 (two)  times daily.     glucose blood (ONE TOUCH ULTRA TEST) test strip Use as instructed 100 each 1   hydrALAZINE (APRESOLINE) 25 MG tablet TAKE 1 TABLET BY MOUTH THREE TIMES DAILY 90 tablet 3   HYDROmorphone (DILAUDID) 2 MG tablet Take 1 tablet (2 mg) by mouth 3 times daily as needed for severe pain. 30 tablet 0   Insulin Pen Needle 32G X 4 MM MISC 1 each by Does not apply route daily. 100 each 3   latanoprost (XALATAN) 0.005 % ophthalmic solution Place 1 drop into both eyes at bedtime.     lidocaine-prilocaine (EMLA) cream Apply to affected area once as directed 30 g 3   losartan (COZAAR) 100 MG tablet Take 1 tablet (100 mg total) by mouth at bedtime. 90 tablet 3   magnesium oxide (MAG-OX) 400 (240 Mg) MG tablet Take 1 tablet (400 mg total) by mouth 2 (two) times daily. 60 tablet 1   metFORMIN (GLUCOPHAGE) 1000 MG tablet Take 1 tablet (1,000 mg total) by mouth 2 (two) times daily. 180 tablet 3   ondansetron (ZOFRAN) 8 MG tablet Take 1 tablet (8 mg total) by mouth every 8 (eight) hours as needed for nausea or vomiting. Start on the third day after chemotherapy. 30 tablet 1   ONETOUCH DELICA LANCETS 33G MISC Please use as directed. 100 each 0   pantoprazole (PROTONIX) 40 MG tablet Take 1 tablet by mouth daily. 360 tablet 0   polyethylene glycol powder (GLYCOLAX/MIRALAX) 17 GM/SCOOP powder Take 17 g by mouth daily. 238 g 1   prochlorperazine (COMPAZINE) 10 MG tablet Take 1 tablet (10 mg total) by mouth every 6 (six) hours as needed for nausea or vomiting. 30 tablet 1   rivaroxaban (XARELTO) 20 MG TABS tablet Take 1 tablet (20 mg total) by mouth daily with supper. 90 tablet 1   No current facility-administered medications for this visit.   Facility-Administered Medications Ordered in Other Visits  Medication Dose Route Frequency Provider Last Rate Last Admin   cyanocobalamin (VITAMIN B12) injection 1,000 mcg  1,000 mcg Intramuscular Once [name], Shresta Risden, MD       dexamethasone (DECADRON) 10 mg in sodium  chloride 0.9 % 50 mL IVPB  10 mg Intravenous Once [name], Lendon George, MD       famotidine (PEPCID) IVPB 20 mg premix  20 mg Intravenous Once [name], Trevante Tennell, MD       fosaprepitant (EMEND) 150 mg in sodium chloride 0.9 % 145 mL IVPB  150 mg Intravenous Once [name], Tyan Lasure, MD       heparin lock flush 100 unit/mL  500 Units Intracatheter Once PRN [name], Derrin Currey, MD       sodium chloride flush (NS) 0.9 % injection 10 mL  10 mL Intracatheter PRN [name], Pa Tennant, MD        SUMMARY OF ONCOLOGIC HISTORY: Oncology History Overview Note  High grade serous, 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 Heterogeneous endometrial thickening with appearance of moderate complex fluid in the endometrial canal, possible hemorrhagic material. Endometrial thickness is considered abnormal for an asymptomatic post-menopausal female. Endometrial sampling should be considered to exclude carcinoma. 2. Nonvisualized ovary 3. Heterogeneous thick-walled urinary bladder, correlate for cystitis.   12/28/2021 Pathology Results   CYTOLOGY - NON PAP  CASE: [id number]  PATIENT:  Sherri Gill  Non-Gynecological Cytology Report   Clinical History: Abnormal pelvic US, highly suspicious of malignant ascites  Specimen Submitted:  A. ASCITES, PARACENTESIS:    FINAL MICROSCOPIC DIAGNOSIS:  - Malignant cells consistent with adenocarcinoma  - See comment   SPECIMEN ADEQUACY:  Satisfactory for evaluation   DIAGNOSTIC COMMENTS:  Immunohistochemical stains show that the tumor cells are positive for CK7 and PAX8 while they are negative for CK20, CDX2 and ER, consistent with above interpretation.  Additionally, the tumor cells are diffusely positive for p53 (clonal overexpression), suggestive of a high-grade serous carcinoma.    12/28/2021 Imaging   US pelvis 1. Persistent abnormal appearance of the endometrium, heterogeneously thickened and complex with possible intraluminal fluid. This is  similar to that seen on December 2022 ultrasound. Endometrial thickness is considered abnormal for an asymptomatic post-menopausal female. Endometrial sampling should be considered to exclude carcinoma. 2. Nonvisualization of the ovaries.   12/28/2021 Imaging   1. Large volume ascites. 2. Small umbilical hernia containing fat and ascites. 3. Haziness of anterior omentum most likely related to ascites, other etiologies such as metastatic disease can not be excluded. 4. Diffuse colonic diverticulosis without evidence for diverticulitis.   12/29/2021 Procedure   Successful ultrasound-guided paracentesis yielding 2.8 liters of peritoneal fluid.   01/14/2022 Initial Diagnosis   Uterine cancer (HCC)   01/14/2022 Cancer Staging   Staging form: Corpus Uteri - Carcinoma and Carcinosarcoma, AJCC 8th Edition - Clinical: Stage III (cT3, cN0, cM0) - Signed by Artis Delay, MD on 01/14/2022 Stage prefix: Initial diagnosis   01/15/2022 Surgery   Surgery: Cervical dilation under ultrasound guidance, hysteroscopy, endometrial sampling using the Myosure   Surgeons:  Carin Hock MD    Pathology: endometrial curetteings   Operative findings: On EUA, 8 cm mobile uterus, some nodularity along cul de sac. Cervix normal in appearance without discernible external os. Difficulty noted in dilating the cervix after using scalpel to incise in area of what what thought to be the external os. Under ultrasound guidance, cervix was dilated with confirmation on imaging of intra-uterine placement of dilators. On hysteroscopy, abnormal appearing endocervix and very calcified tissue obscuring good visualization of endometrium concerning for replacement by tumor.    01/15/2022 Pathology Results   FINAL MICROSCOPIC DIAGNOSIS:   A.   ENDOMETRIUM, CURETTAGE:  -    Serous endometrial carcinoma.   COMMENT:   The carcinoma is strongly and diffusely positive for p53 and diffuse p16 positivity.  The ER shows relatively diffuse  positivity.  Overall, the histologic features (slit-like glandular spaces and psammoma bodies) and the immunohistochemical phenotype are most characteristic for a serous carcinoma.    01/21/2022 Procedure   Placement of single lumen port a cath via right internal jugular vein. The catheter tip lies at the cavo-atrial junction. A power injectable port a cath was placed and is ready for immediate use.     01/25/2022 - 06/21/2022 Chemotherapy   Patient is on Treatment Plan : UTERINE Carboplatin AUC 6 / Paclitaxel q21d     03/21/2022 Imaging   1. Near complete resolution of previously noted ascites, with only small volume residual perihepatic ascites. 2. Improved omental caking and stranding in the ventral abdomen. 3. Findings are consistent with treatment response of peritoneal metastatic disease. 4. Thickening of the urinary bladder wall , consistent with nonspecific infectious or inflammatory cystitis. Correlate with urinalysis.   Aortic Atherosclerosis (ICD10-I70.0).       03/22/2022 Tumor Marker   Patient's tumor was tested for the following markers:  CA-125. Results of the tumor marker test revealed 21.5.   04/09/2022 Surgery     Surgery: Diagnostic laparoscopy, conversion to exploratory laparotomy, partial omentectomy, lysis of adhesions for approximately 60 minutes, total abdominal hysterectomy, bilateral salpingo-oophorectomy, excision of ileal tumor implant and excision of transverse colon epiploica tumor implant   04/09/2022 Pathology Results   A.   OMENTUM, RESECTION:  -    Metastatic serous carcinoma.   B.   ILEAL TUMOR IMPLANTS, EXCISION:  -    Adhesions with small focus of metastatic serous carcinoma, possibly  intralymphatic.   C.   UTERUS, CERVIX, BILATERAL FALLOPIAN TUBES AND OVARIES:  -    Serous carcinoma, endometrial, invasive into outer half of uterus,  with involvement of  cervical stroma, uterine serosa, ovaries and fallopian tubes.   -    Negative for cervical  dysplasia.  -    Leiomyomas, largest 1.2 cm in greatest dimension.   D.   COLON, MESENTARY NODULE, TRANSVERSE, EXCISION:  -    Metastatic serous carcinoma   05/09/2022 Tumor Marker   Patient's tumor was tested for the following markers: CA-125. Results of the tumor marker test revealed 12.1.   05/29/2022 Tumor Marker   Patient's tumor was tested for the following markers: CA-125. Results of the tumor marker test revealed 9.1.   07/23/2022 Imaging   1. No abdominopelvic lymphadenopathy.  2. Scattered foci of fat stranding in the anterior pelvic peritoneal fat without discrete solid implants. Mild smooth wall thickening of the pelvic peritoneum. These findings are nonspecific and could represent postsurgical/post treatment change, although residual peritoneal carcinomatosis is on the differential and close follow-up CT is suggested. 3. Moderate left colonic diverticulosis. 4. Aortic Atherosclerosis (ICD10-I70.0).   07/23/2022 Tumor Marker   Patient's tumor was tested for the following markers: CA-125. Results of the tumor marker test revealed 5.2.   09/09/2022 Tumor Marker   Patient's tumor was tested for the following markers: CA-125. Results of the tumor marker test revealed 10.1.   01/28/2023 Imaging   OMENTAL AND PERITONEAL CARCINOMATOSIS.   POTENTIAL SOURCES ARE GASTRIC (MID AND DISTAL GASTRIC WALL THICKENING) OR GENITOURINARY (SOFT TISSUE MASS EXTENDS CEPHALAD FROM THE LEFT CERVICAL CUFF TOWARDS THE LEFT ADNEXA).   IF GASTRIC, THE PELVIC MASS WOULD REPRESENT AN OMENTAL DEPOSIT. PELVIC MASS OBSTRUCTING LEFT KIDNEY, MILD HYDROURETERONEPHROSIS.   MEDIASTINAL ADENOPATHY, LIKELY NODAL DISEASE.    02/03/2023 Tumor Marker   Patient's tumor was tested for the following markers: CA-125. Results of the tumor marker test revealed 429.   02/07/2023 -  Chemotherapy   Patient is on Treatment Plan : UTERINE ENDOMETRIAL Dostarlimab-gxly (500 mg) + Carboplatin (AUC 5) + Paclitaxel (175 mg/m2) q21d  x 6 cycles / Dostarlimab-gxly (1000 mg) q42d x 6 cycles      03/03/2023 Tumor Marker   Patient's tumor was tested for the following markers: CA-125. Results of the tumor marker test revealed 176.   04/09/2023 Imaging   CT CHEST ABDOMEN PELVIS W CONTRAST  Result Date: 04/08/2023 CLINICAL DATA:  Endometrial cancer/uterine cancer. Evaluate treatment response to chemotherapy. * Tracking Code: BO * EXAM: CT CHEST, ABDOMEN, AND PELVIS WITH CONTRAST TECHNIQUE: Multidetector CT imaging of the chest, abdomen and pelvis was performed following the standard protocol during bolus administration of intravenous contrast. RADIATION DOSE REDUCTION: This exam was performed according to the departmental dose-optimization program which includes automated exposure control, adjustment of the mA and/or kV according to patient size and/or use of iterative reconstruction technique. CONTRAST:  OMNIPAQUE IOHEXOL 300 MG/ML  SOLN COMPARISON:  Outside CTs of 01/28/2023. Report available in the Care everywhere epic system. FINDINGS: CT CHEST FINDINGS Cardiovascular: Right Port-A-Cath tip mid right atrium. Bovine arch. Aortic atherosclerosis. Mild cardiomegaly, without pericardial effusion. No central pulmonary embolism, on this non-dedicated study. Mediastinum/Nodes: No supraclavicular adenopathy. Right paratracheal node measures 7 mm and 16/2 versus 11 mm on the prior exam (when remeasured). No hilar adenopathy. Resolution of prevascular adenopathy with an index node measuring 5 mm on 18/2 versus up to 13 mm on the prior exam (when remeasured). Lungs/Pleura: No pleural fluid.  Clear lungs. Musculoskeletal: Although lack of sagittal reformats on the prior exam makes direct comparison challenging, there are sclerotic lesions within the T8 and T2 vertebral bodies on sagittal image 99 which are felt to be new since the prior. CT ABDOMEN PELVIS FINDINGS Hepatobiliary: No focal liver lesion. Normal gallbladder, without biliary ductal  dilatation. Pancreas: Normal, without mass or ductal dilatation. Spleen: Normal in size, without focal abnormality. Adrenals/Urinary Tract: Normal adrenal glands. Normal kidneys, without hydronephrosis. The bladder is decompressed. Apparent wall thickening may be secondary. Stomach/Bowel: The gastric antrum is underdistended and appears thick walled including at 1.5 cm on 59/2. Similar 1.6 cm on the prior. Colonic stool burden suggests constipation. Scattered colonic diverticula. Normal terminal ileum and appendix. Borderline prominent upper pelvic fluid-filled small bowel loops of up to 2.5 cm. No proximal small bowel distension. Vascular/Lymphatic: Aortic atherosclerosis. No abdominal adenopathy. A left external iliac node measures 11 mm on 101/2 is similar to on the prior exam (when remeasured). Decreased size of tiny left obturator nodes including up to 4 mm today versus 9 mm on the prior. Reproductive: Hysterectomy. Other: Omental thickening/nodularity again identified. Example right abdomen at 9 mm on 74/2 versus 14 mm on the prior exam (when remeasured). Soft tissue fullness about the superior aspect of the left vaginal cuff is less well-defined today. Example at on the order of 3.3 x 2.4 cm on 97/2 versus 5.3 x 5.0 cm when remeasured in a similar fashion on the prior. Mild ventral/periumbilical hernia or laxity contains fat and peritoneal tumor, similar on 77/2. Musculoskeletal: No acute osseous abnormality. IMPRESSION: 1. Response to therapy since outside CTs of 01/28/2023. Improvement in abdominopelvic nodal/peritoneal metastasis. Resolution of thoracic adenopathy. 2. Developing sclerotic lesions within the thoracic spine which given response to therapy elsewhere likely represent treated previously CT occult metastasis. 3. Areas of borderline to mild small bowel dilatation within the upper pelvis could represent ileus or low-grade partial obstruction, given peritoneal disease. 4.  Aortic Atherosclerosis  (ICD10-I70.0). 5. Similar gastric antral thickening in the setting of underdistention. Suspicious for gastritis. Electronically Signed   By: Jeronimo Greaves M.D.   On: 04/08/2023 16:29        PHYSICAL EXAMINATION: ECOG PERFORMANCE STATUS: 1 - Symptomatic but completely ambulatory  Vitals:   04/11/23 0923  BP: (!) 150/58  Pulse: 97  Resp: 18  SpO2: 95%   Filed Weights   04/11/23 0923  Weight: 177 lb 3.2 oz (80.4 kg)    GENERAL:alert, no distress and comfortable NEURO: alert & oriented x 3 with fluent speech, no focal motor/sensory deficits  LABORATORY DATA:  I have reviewed the data as listed    Component Value Date/Time   NA 140 03/25/2023 1541   K 4.6 03/25/2023 1541   CL 98 03/25/2023 1541   CO2 25 03/25/2023 1541   GLUCOSE 95 03/25/2023 1541   GLUCOSE 100 (H) 03/21/2023 0816   GLUCOSE 93 09/10/2006 0834   BUN 12  03/25/2023 1541   CREATININE 0.86 03/25/2023 1541   CREATININE 0.88 03/21/2023 0816   CREATININE 0.64 07/13/2014 1621   CALCIUM 9.6 03/25/2023 1541   CALCIUM 9.7 03/29/2010 1554   PROT 6.4 (L) 03/21/2023 0816   PROT 7.6 11/09/2020 1156   ALBUMIN 3.8 03/21/2023 0816   ALBUMIN 4.4 11/09/2020 1156   AST 10 (L) 03/21/2023 0816   ALT 7 03/21/2023 0816   ALKPHOS 113 03/21/2023 0816   BILITOT 0.3 03/21/2023 0816   GFRNONAA >60 03/21/2023 0816   GFRNONAA >89 07/13/2014 1621   GFRAA 86 11/09/2020 1156   GFRAA >89 07/13/2014 1621    No results found for: "SPEP", "UPEP"  Lab Results  Component Value Date   WBC 8.8 04/11/2023   NEUTROABS 7.4 04/11/2023   HGB 9.6 (L) 04/11/2023   HCT 29.2 (L) 04/11/2023   MCV 83.0 04/11/2023   PLT 307 04/11/2023      Chemistry      Component Value Date/Time   NA 140 03/25/2023 1541   K 4.6 03/25/2023 1541   CL 98 03/25/2023 1541   CO2 25 03/25/2023 1541   BUN 12 03/25/2023 1541   CREATININE 0.86 03/25/2023 1541   CREATININE 0.88 03/21/2023 0816   CREATININE 0.64 07/13/2014 1621      Component Value Date/Time    CALCIUM 9.6 03/25/2023 1541   CALCIUM 9.7 03/29/2010 1554   ALKPHOS 113 03/21/2023 0816   AST 10 (L) 03/21/2023 0816   ALT 7 03/21/2023 0816   BILITOT 0.3 03/21/2023 0816       RADIOGRAPHIC STUDIES: I have reviewed imaging studies with the patient I have personally reviewed the radiological images as listed and agreed with the findings in the report. CT CHEST ABDOMEN PELVIS W CONTRAST  Result Date: 04/08/2023 CLINICAL DATA:  Endometrial cancer/uterine cancer. Evaluate treatment response to chemotherapy. * Tracking Code: BO * EXAM: CT CHEST, ABDOMEN, AND PELVIS WITH CONTRAST TECHNIQUE: Multidetector CT imaging of the chest, abdomen and pelvis was performed following the standard protocol during bolus administration of intravenous contrast. RADIATION DOSE REDUCTION: This exam was performed according to the departmental dose-optimization program which includes automated exposure control, adjustment of the mA and/or kV according to patient size and/or use of iterative reconstruction technique. CONTRAST:  OMNIPAQUE IOHEXOL 300 MG/ML  SOLN COMPARISON:  Outside CTs of 01/28/2023. Report available in the Care everywhere epic system. FINDINGS: CT CHEST FINDINGS Cardiovascular: Right Port-A-Cath tip mid right atrium. Bovine arch. Aortic atherosclerosis. Mild cardiomegaly, without pericardial effusion. No central pulmonary embolism, on this non-dedicated study. Mediastinum/Nodes: No supraclavicular adenopathy. Right paratracheal node measures 7 mm and 16/2 versus 11 mm on the prior exam (when remeasured). No hilar adenopathy. Resolution of prevascular adenopathy with an index node measuring 5 mm on 18/2 versus up to 13 mm on the prior exam (when remeasured). Lungs/Pleura: No pleural fluid.  Clear lungs. Musculoskeletal: Although lack of sagittal reformats on the prior exam makes direct comparison challenging, there are sclerotic lesions within the T8 and T2 vertebral bodies on sagittal image 99 which are  felt to be new since the prior. CT ABDOMEN PELVIS FINDINGS Hepatobiliary: No focal liver lesion. Normal gallbladder, without biliary ductal dilatation. Pancreas: Normal, without mass or ductal dilatation. Spleen: Normal in size, without focal abnormality. Adrenals/Urinary Tract: Normal adrenal glands. Normal kidneys, without hydronephrosis. The bladder is decompressed. Apparent wall thickening may be secondary. Stomach/Bowel: The gastric antrum is underdistended and appears thick walled including at 1.5 cm on 59/2. Similar 1.6 cm on the prior.  Colonic stool burden suggests constipation. Scattered colonic diverticula. Normal terminal ileum and appendix. Borderline prominent upper pelvic fluid-filled small bowel loops of up to 2.5 cm. No proximal small bowel distension. Vascular/Lymphatic: Aortic atherosclerosis. No abdominal adenopathy. A left external iliac node measures 11 mm on 101/2 is similar to on the prior exam (when remeasured). Decreased size of tiny left obturator nodes including up to 4 mm today versus 9 mm on the prior. Reproductive: Hysterectomy. Other: Omental thickening/nodularity again identified. Example right abdomen at 9 mm on 74/2 versus 14 mm on the prior exam (when remeasured). Soft tissue fullness about the superior aspect of the left vaginal cuff is less well-defined today. Example at on the order of 3.3 x 2.4 cm on 97/2 versus 5.3 x 5.0 cm when remeasured in a similar fashion on the prior. Mild ventral/periumbilical hernia or laxity contains fat and peritoneal tumor, similar on 77/2. Musculoskeletal: No acute osseous abnormality. IMPRESSION: 1. Response to therapy since outside CTs of 01/28/2023. Improvement in abdominopelvic nodal/peritoneal metastasis. Resolution of thoracic adenopathy. 2. Developing sclerotic lesions within the thoracic spine which given response to therapy elsewhere likely represent treated previously CT occult metastasis. 3. Areas of borderline to mild small bowel  dilatation within the upper pelvis could represent ileus or low-grade partial obstruction, given peritoneal disease. 4.  Aortic Atherosclerosis (ICD10-I70.0). 5. Similar gastric antral thickening in the setting of underdistention. Suspicious for gastritis. Electronically Signed   By: Jeronimo Greaves M.D.   On: 04/08/2023 16:29

## 2023-04-11 NOTE — Progress Notes (Signed)
Nutrition Follow-up:  Patient with recurrent uterine cancer. She is receiving paraplatin, taxol, and jemperli q21d.   Met with patient in infusion. She reports appetite was better following last treatment. Patient taste has improved and eating more. Recalls chicken, spinach, rice this morning. Patient reports less diarrhea episodes. Patient is taking miralax 3x/week. Patient requesting additional samples of banatrol to take if needed for diarrhea. States MD has instructed not to take imodium and increase water as recent imaging showed small stool burden. Patient asking if soda is bad to drink if diagnosed with cancer.    Medications: reviewed  Labs: reviewed   Anthropometrics: Wt 177 lb 3.2 oz today decreased from 183 lb 3.2 oz on 5/24 (BLE edema improved today - suspect wt loss related to fluid shift)  5/10 - 176 lb    NUTRITION DIAGNOSIS: Inadequate oral intake improved    INTERVENTION:  Continue protein shake once daily for added calories and protein Bowel regimen per MD Additional samples of Banatrol and CIB powder provided Educated on empty calories in soda, encouraged protein shake instead - handout provided      MONITORING, EVALUATION, GOAL: weight trends, intake   NEXT VISIT: To be scheduled as needed

## 2023-04-11 NOTE — Assessment & Plan Note (Signed)
She has frequent irregular bowel habits with intermittent loose stool and frequent bowel movement I recommend the patient to reduce MiraLAX intake and to avoid taking Imodium if possible I suspect the changes in her CT scan is still consistent with mild abdominal carcinomatosis but I anticipate improvement with more chemotherapy

## 2023-04-11 NOTE — Assessment & Plan Note (Signed)
She has multifactorial anemia She will continue vitamin B12 Injection

## 2023-04-11 NOTE — Patient Instructions (Signed)
[facility]  Discharge Instructions: Thank you for choosing [facility] to provide your oncology and hematology care.   If you have a lab appointment with the Cancer Center, please go directly to the Cancer Center and check in at the registration area.   Wear comfortable clothing and clothing appropriate for easy access to any Portacath or PICC line.   We strive to give you quality time with your provider. You may need to reschedule your appointment if you arrive late (15 or more minutes).  Arriving late affects you and other patients whose appointments are after yours.  Also, if you miss three or more appointments without notifying the office, you may be dismissed from the clinic at the provider's discretion.      For prescription refill requests, have your pharmacy contact our office and allow 72 hours for refills to be completed.    Today you received the following chemotherapy and/or immunotherapy agents: Jemperli, Paclitaxel, Carboplatin.       To help prevent nausea and vomiting after your treatment, we encourage you to take your nausea medication as directed.  BELOW ARE SYMPTOMS THAT SHOULD BE REPORTED IMMEDIATELY: *FEVER GREATER THAN 100.4 F (38 C) OR HIGHER *CHILLS OR SWEATING *NAUSEA AND VOMITING THAT IS NOT CONTROLLED WITH YOUR NAUSEA MEDICATION *UNUSUAL SHORTNESS OF BREATH *UNUSUAL BRUISING OR BLEEDING *URINARY PROBLEMS (pain or burning when urinating, or frequent urination) *BOWEL PROBLEMS (unusual diarrhea, constipation, pain near the anus) TENDERNESS IN MOUTH AND THROAT WITH OR WITHOUT PRESENCE OF ULCERS (sore throat, sores in mouth, or a toothache) UNUSUAL RASH, SWELLING OR PAIN  UNUSUAL VAGINAL DISCHARGE OR ITCHING   Items with * indicate a potential emergency and should be followed up as soon as possible or go to the Emergency Department if any problems should occur.  Please show the CHEMOTHERAPY ALERT CARD or  IMMUNOTHERAPY ALERT CARD at check-in to the Emergency Department and triage nurse.  Should you have questions after your visit or need to cancel or reschedule your appointment, please contact [facility]  Dept: [phone]  and follow the prompts.  Office hours are 8:00 a.m. to 4:30 p.m. Monday - Friday. Please note that voicemails left after 4:00 p.m. may not be returned until the following business day.  We are closed weekends and major holidays. You have access to a nurse at all times for urgent questions. Please call the main number to the clinic Dept: [phone] and follow the prompts.   For any non-urgent questions, you may also contact your provider using MyChart. We now offer e-Visits for anyone 18 and older to request care online for non-urgent symptoms. For details visit [email or web address].   Also download the MyChart app! Go to the app store, search "MyChart", open the app, select [place], and log in with your MyChart username and password.   

## 2023-04-11 NOTE — Assessment & Plan Note (Signed)
I have reviewed multiple imaging studies with the patient Overall, she has positive response to therapy She still have some mild persistent abdominal carcinomatosis affecting her regular bowel habits but overall improving I recommend we continue treatment for a few more cycles with plan to repeat imaging study again in September

## 2023-04-12 LAB — CA 125: Cancer Antigen (CA) 125: 100 U/mL — ABNORMAL HIGH (ref 0.0–38.1)

## 2023-04-15 ENCOUNTER — Other Ambulatory Visit: Payer: Self-pay

## 2023-04-21 ENCOUNTER — Telehealth: Payer: Self-pay

## 2023-04-21 ENCOUNTER — Other Ambulatory Visit: Payer: Self-pay | Admitting: Internal Medicine

## 2023-04-21 DIAGNOSIS — I4891 Unspecified atrial fibrillation: Secondary | ICD-10-CM

## 2023-04-21 MED ORDER — RIVAROXABAN 20 MG PO TABS: 20.0000 mg | ORAL_TABLET | Freq: Every day | ORAL | 1 refills | Status: DC

## 2023-04-21 NOTE — Patient Outreach (Signed)
  Care Coordination   Follow Up Visit Note   04/21/2023 Name: KENDI DEFALCO MRN: [id number] DOB: Mar 31, 1951  AUBRYANNA NESHEIM is a 72 y.o. year old female who sees Mayford Knife, Dorene Ar, MD for primary care. I spoke with  [name] by phone today.  What matters to the patients health and wellness today?  Refill of Xarelto    Goals Addressed               This Visit's Progress     "I need a refill on my Xarelto and I need it sent to [facility] (pt-stated)        Care Coordination Interventions: Reviewed medications with patient and discussed her need for refill of Xarelto and sent to [facility] Collaborated with Dr. Mayford Knife regarding prescription for Xarelto         SDOH assessments and interventions completed:  Yes  SDOH Interventions Today    Flowsheet Row Most Recent Value  SDOH Interventions   Food Insecurity Interventions Intervention Not Indicated  Transportation Interventions Intervention Not Indicated  Utilities Interventions Intervention Not Indicated        Care Coordination Interventions:  Yes, provided   Follow up plan:  Follow up to ensure prescription sent    Encounter Outcome:  Pt. Visit Completed

## 2023-04-22 ENCOUNTER — Ambulatory Visit: Payer: Medicare HMO | Admitting: Licensed Clinical Social Worker

## 2023-04-24 ENCOUNTER — Encounter: Payer: Self-pay | Admitting: Dietician

## 2023-04-30 MED FILL — Fosaprepitant Dimeglumine For IV Infusion 150 MG (Base Eq): INTRAVENOUS | Qty: 5 | Status: AC

## 2023-04-30 MED FILL — Dexamethasone Sodium Phosphate Inj 100 MG/10ML: INTRAMUSCULAR | Qty: 1 | Status: AC

## 2023-05-02 ENCOUNTER — Inpatient Hospital Stay: Payer: Medicare HMO | Attending: Hematology and Oncology

## 2023-05-02 ENCOUNTER — Encounter: Payer: Self-pay | Admitting: Hematology and Oncology

## 2023-05-02 ENCOUNTER — Inpatient Hospital Stay: Payer: Medicare HMO | Admitting: Hematology and Oncology

## 2023-05-02 ENCOUNTER — Inpatient Hospital Stay: Payer: Medicare HMO

## 2023-05-02 ENCOUNTER — Other Ambulatory Visit: Payer: Self-pay

## 2023-05-02 ENCOUNTER — Other Ambulatory Visit (HOSPITAL_COMMUNITY): Payer: Self-pay

## 2023-05-02 ENCOUNTER — Telehealth: Payer: Self-pay

## 2023-05-02 VITALS — BP 149/60 | HR 77 | Resp 17

## 2023-05-02 VITALS — BP 173/63 | HR 88 | Temp 97.3°F | Resp 22 | Wt 180.0 lb

## 2023-05-02 DIAGNOSIS — Z5112 Encounter for antineoplastic immunotherapy: Secondary | ICD-10-CM | POA: Diagnosis not present

## 2023-05-02 DIAGNOSIS — R188 Other ascites: Secondary | ICD-10-CM | POA: Insufficient documentation

## 2023-05-02 DIAGNOSIS — T451X5A Adverse effect of antineoplastic and immunosuppressive drugs, initial encounter: Secondary | ICD-10-CM

## 2023-05-02 DIAGNOSIS — Z888 Allergy status to other drugs, medicaments and biological substances status: Secondary | ICD-10-CM | POA: Insufficient documentation

## 2023-05-02 DIAGNOSIS — M79604 Pain in right leg: Secondary | ICD-10-CM | POA: Diagnosis not present

## 2023-05-02 DIAGNOSIS — I700 Atherosclerosis of aorta: Secondary | ICD-10-CM | POA: Diagnosis not present

## 2023-05-02 DIAGNOSIS — M791 Myalgia, unspecified site: Secondary | ICD-10-CM | POA: Insufficient documentation

## 2023-05-02 DIAGNOSIS — R971 Elevated cancer antigen 125 [CA 125]: Secondary | ICD-10-CM | POA: Diagnosis not present

## 2023-05-02 DIAGNOSIS — Z880 Allergy status to penicillin: Secondary | ICD-10-CM | POA: Insufficient documentation

## 2023-05-02 DIAGNOSIS — Z885 Allergy status to narcotic agent status: Secondary | ICD-10-CM | POA: Insufficient documentation

## 2023-05-02 DIAGNOSIS — K429 Umbilical hernia without obstruction or gangrene: Secondary | ICD-10-CM | POA: Insufficient documentation

## 2023-05-02 DIAGNOSIS — C786 Secondary malignant neoplasm of retroperitoneum and peritoneum: Secondary | ICD-10-CM | POA: Diagnosis not present

## 2023-05-02 DIAGNOSIS — Z79899 Other long term (current) drug therapy: Secondary | ICD-10-CM | POA: Diagnosis not present

## 2023-05-02 DIAGNOSIS — D6481 Anemia due to antineoplastic chemotherapy: Secondary | ICD-10-CM | POA: Insufficient documentation

## 2023-05-02 DIAGNOSIS — Z7901 Long term (current) use of anticoagulants: Secondary | ICD-10-CM | POA: Diagnosis not present

## 2023-05-02 DIAGNOSIS — C55 Malignant neoplasm of uterus, part unspecified: Secondary | ICD-10-CM | POA: Diagnosis not present

## 2023-05-02 DIAGNOSIS — R7989 Other specified abnormal findings of blood chemistry: Secondary | ICD-10-CM | POA: Diagnosis not present

## 2023-05-02 DIAGNOSIS — M79605 Pain in left leg: Secondary | ICD-10-CM | POA: Insufficient documentation

## 2023-05-02 DIAGNOSIS — Z79620 Long term (current) use of immunosuppressive biologic: Secondary | ICD-10-CM | POA: Diagnosis not present

## 2023-05-02 DIAGNOSIS — Z79630 Long term (current) use of alkylating agent: Secondary | ICD-10-CM | POA: Diagnosis not present

## 2023-05-02 DIAGNOSIS — D508 Other iron deficiency anemias: Secondary | ICD-10-CM

## 2023-05-02 DIAGNOSIS — Z5111 Encounter for antineoplastic chemotherapy: Secondary | ICD-10-CM | POA: Diagnosis not present

## 2023-05-02 DIAGNOSIS — K5730 Diverticulosis of large intestine without perforation or abscess without bleeding: Secondary | ICD-10-CM | POA: Insufficient documentation

## 2023-05-02 DIAGNOSIS — Z79633 Long term (current) use of mitotic inhibitor: Secondary | ICD-10-CM | POA: Insufficient documentation

## 2023-05-02 DIAGNOSIS — I517 Cardiomegaly: Secondary | ICD-10-CM | POA: Diagnosis not present

## 2023-05-02 LAB — CMP (CANCER CENTER ONLY)
ALT: 6 U/L (ref 0–44)
AST: 9 U/L — ABNORMAL LOW (ref 15–41)
Albumin: 3.8 g/dL (ref 3.5–5.0)
Alkaline Phosphatase: 136 U/L — ABNORMAL HIGH (ref 38–126)
Anion gap: 11 (ref 5–15)
BUN: 24 mg/dL — ABNORMAL HIGH (ref 8–23)
CO2: 24 mmol/L (ref 22–32)
Calcium: 9.9 mg/dL (ref 8.9–10.3)
Chloride: 103 mmol/L (ref 98–111)
Creatinine: 1.33 mg/dL — ABNORMAL HIGH (ref 0.44–1.00)
GFR, Estimated: 43 mL/min — ABNORMAL LOW (ref >60)
Glucose, Bld: 157 mg/dL — ABNORMAL HIGH (ref 70–99)
Potassium: 4.2 mmol/L (ref 3.5–5.1)
Sodium: 138 mmol/L (ref 135–145)
Total Bilirubin: 0.4 mg/dL (ref 0.3–1.2)
Total Protein: 6.6 g/dL (ref 6.5–8.1)

## 2023-05-02 LAB — CBC WITH DIFFERENTIAL (CANCER CENTER ONLY)
Abs Immature Granulocytes: 0.06 10*3/uL (ref 0.00–0.07)
Basophils Absolute: 0 10*3/uL (ref 0.0–0.1)
Basophils Relative: 0 %
Eosinophils Absolute: 0 10*3/uL (ref 0.0–0.5)
Eosinophils Relative: 0 %
HCT: 26.5 % — ABNORMAL LOW (ref 36.0–46.0)
Hemoglobin: 8.6 g/dL — ABNORMAL LOW (ref 12.0–15.0)
Immature Granulocytes: 1 %
Lymphocytes Relative: 13 %
Lymphs Abs: 0.9 10*3/uL (ref 0.7–4.0)
MCH: 27.4 pg (ref 26.0–34.0)
MCHC: 32.5 g/dL (ref 30.0–36.0)
MCV: 84.4 fL (ref 80.0–100.0)
Monocytes Absolute: 0.2 10*3/uL (ref 0.1–1.0)
Monocytes Relative: 3 %
Neutro Abs: 6.1 10*3/uL (ref 1.7–7.7)
Neutrophils Relative %: 83 %
Platelet Count: 300 10*3/uL (ref 150–400)
RBC: 3.14 MIL/uL — ABNORMAL LOW (ref 3.87–5.11)
RDW: 18 % — ABNORMAL HIGH (ref 11.5–15.5)
WBC Count: 7.4 10*3/uL (ref 4.0–10.5)
nRBC: 0 % (ref 0.0–0.2)

## 2023-05-02 LAB — SAMPLE TO BLOOD BANK

## 2023-05-02 MED ORDER — SODIUM CHLORIDE 0.9 % IV SOLN
150.0000 mg | Freq: Once | INTRAVENOUS | Status: AC
  Administered 2023-05-02: 150 mg via INTRAVENOUS
  Filled 2023-05-02: qty 150

## 2023-05-02 MED ORDER — FAMOTIDINE IN NACL 20-0.9 MG/50ML-% IV SOLN
20.0000 mg | Freq: Once | INTRAVENOUS | Status: AC
  Administered 2023-05-02: 20 mg via INTRAVENOUS
  Filled 2023-05-02: qty 50

## 2023-05-02 MED ORDER — SODIUM CHLORIDE 0.9 % IV SOLN
500.0000 mg | Freq: Once | INTRAVENOUS | Status: AC
  Administered 2023-05-02: 500 mg via INTRAVENOUS
  Filled 2023-05-02: qty 10

## 2023-05-02 MED ORDER — HEPARIN SOD (PORK) LOCK FLUSH 100 UNIT/ML IV SOLN
500.0000 [IU] | Freq: Once | INTRAVENOUS | Status: AC | PRN
  Administered 2023-05-02: 500 [IU]

## 2023-05-02 MED ORDER — SODIUM CHLORIDE 0.9% FLUSH
10.0000 mL | INTRAVENOUS | Status: DC | PRN
  Administered 2023-05-02: 10 mL

## 2023-05-02 MED ORDER — CETIRIZINE HCL 10 MG/ML IV SOLN
10.0000 mg | Freq: Once | INTRAVENOUS | Status: AC
  Administered 2023-05-02: 10 mg via INTRAVENOUS
  Filled 2023-05-02: qty 1

## 2023-05-02 MED ORDER — SODIUM CHLORIDE 0.9 % IV SOLN: Freq: Once | INTRAVENOUS | Status: AC

## 2023-05-02 MED ORDER — PALONOSETRON HCL INJECTION 0.25 MG/5ML
0.2500 mg | Freq: Once | INTRAVENOUS | Status: AC
  Administered 2023-05-02: 0.25 mg via INTRAVENOUS
  Filled 2023-05-02: qty 5

## 2023-05-02 MED ORDER — HYDROMORPHONE HCL 2 MG PO TABS
2.0000 mg | ORAL_TABLET | Freq: Three times a day (TID) | ORAL | 0 refills | Status: DC | PRN
  Filled 2023-05-02: qty 30, 10d supply, fill #0

## 2023-05-02 MED ORDER — DEXAMETHASONE 4 MG PO TABS
4.0000 mg | ORAL_TABLET | Freq: Every day | ORAL | 6 refills | Status: DC
  Filled 2023-05-02: qty 30, 30d supply, fill #0

## 2023-05-02 MED ORDER — SODIUM CHLORIDE 0.9 % IV SOLN
10.0000 mg | Freq: Once | INTRAVENOUS | Status: AC
  Administered 2023-05-02: 10 mg via INTRAVENOUS
  Filled 2023-05-02: qty 10

## 2023-05-02 MED ORDER — SODIUM CHLORIDE 0.9 % IV SOLN
300.0000 mg | Freq: Once | INTRAVENOUS | Status: AC
  Administered 2023-05-02: 300 mg via INTRAVENOUS
  Filled 2023-05-02: qty 30

## 2023-05-02 MED ORDER — SODIUM CHLORIDE 0.9 % IV SOLN
131.2500 mg/m2 | Freq: Once | INTRAVENOUS | Status: AC
  Administered 2023-05-02: 252 mg via INTRAVENOUS
  Filled 2023-05-02: qty 42

## 2023-05-02 NOTE — Assessment & Plan Note (Signed)
She developed profound leg pain with last treatment I suspect this is caused by Taxol I plan minor dose adjustment We will continue treatment as scheduled with plan to repeat imaging study again in August

## 2023-05-02 NOTE — Telephone Encounter (Signed)
Called regarding not showing up for appts today. She said that she is 10 mins out and on her way now.

## 2023-05-02 NOTE — Assessment & Plan Note (Signed)
She is getting more anemic This is due to side effects of treatment As above, we will proceed with treatment with minor dose adjustment

## 2023-05-02 NOTE — Progress Notes (Signed)
OK to adjust today's Carboplatin dose w/ today's CrCl = 51 mL/min--> 300 mg.  Discussed w/ Dr. [name].  Ebony Hail, Pharm.D., CPP 05/02/2023@10 :11 AM

## 2023-05-02 NOTE — Assessment & Plan Note (Signed)
Likely due to dehydration We will adjust the dose of her carboplatin accordingly

## 2023-05-02 NOTE — Patient Instructions (Signed)
[facility]  Discharge Instructions: Thank you for choosing [facility] to provide your oncology and hematology care.   If you have a lab appointment with the Cancer Center, please go directly to the Cancer Center and check in at the registration area.   Wear comfortable clothing and clothing appropriate for easy access to any Portacath or PICC line.   We strive to give you quality time with your provider. You may need to reschedule your appointment if you arrive late (15 or more minutes).  Arriving late affects you and other patients whose appointments are after yours.  Also, if you miss three or more appointments without notifying the office, you may be dismissed from the clinic at the provider's discretion.      For prescription refill requests, have your pharmacy contact our office and allow 72 hours for refills to be completed.    Today you received the following chemotherapy and/or immunotherapy agents : Jemperli, Taxol, Carboplatin      To help prevent nausea and vomiting after your treatment, we encourage you to take your nausea medication as directed.  BELOW ARE SYMPTOMS THAT SHOULD BE REPORTED IMMEDIATELY: *FEVER GREATER THAN 100.4 F (38 C) OR HIGHER *CHILLS OR SWEATING *NAUSEA AND VOMITING THAT IS NOT CONTROLLED WITH YOUR NAUSEA MEDICATION *UNUSUAL SHORTNESS OF BREATH *UNUSUAL BRUISING OR BLEEDING *URINARY PROBLEMS (pain or burning when urinating, or frequent urination) *BOWEL PROBLEMS (unusual diarrhea, constipation, pain near the anus) TENDERNESS IN MOUTH AND THROAT WITH OR WITHOUT PRESENCE OF ULCERS (sore throat, sores in mouth, or a toothache) UNUSUAL RASH, SWELLING OR PAIN  UNUSUAL VAGINAL DISCHARGE OR ITCHING   Items with * indicate a potential emergency and should be followed up as soon as possible or go to the Emergency Department if any problems should occur.  Please show the CHEMOTHERAPY ALERT CARD or IMMUNOTHERAPY  ALERT CARD at check-in to the Emergency Department and triage nurse.  Should you have questions after your visit or need to cancel or reschedule your appointment, please contact [facility]  Dept: [phone]  and follow the prompts.  Office hours are 8:00 a.m. to 4:30 p.m. Monday - Friday. Please note that voicemails left after 4:00 p.m. may not be returned until the following business day.  We are closed weekends and major holidays. You have access to a nurse at all times for urgent questions. Please call the main number to the clinic Dept: [phone] and follow the prompts.   For any non-urgent questions, you may also contact your provider using MyChart. We now offer e-Visits for anyone 7 and older to request care online for non-urgent symptoms. For details visit [email or web address].   Also download the MyChart app! Go to the app store, search "MyChart", open the app, select [place], and log in with your MyChart username and password.

## 2023-05-02 NOTE — Assessment & Plan Note (Signed)
Could be related to side effects of treatment I refilled her prescription Dilaudid with dose adjustment as above

## 2023-05-02 NOTE — Progress Notes (Signed)
[facility] OFFICE PROGRESS NOTE  Patient Care Team: [name], MD as PCP - General (Internal Medicine) Rennis Golden Lisette Abu, MD as PCP - Cardiology (Cardiology)  ASSESSMENT & PLAN:  Uterine cancer [facility]) She developed profound leg pain with last treatment I suspect this is caused by Taxol I plan minor dose adjustment We will continue treatment as scheduled with plan to repeat imaging study again in August  Anemia due to antineoplastic chemotherapy She is getting more anemic This is due to side effects of treatment As above, we will proceed with treatment with minor dose adjustment  Bilateral leg pain Could be related to side effects of treatment I refilled her prescription Dilaudid with dose adjustment as above  Elevated serum creatinine Likely due to dehydration We will adjust the dose of her carboplatin accordingly  No orders of the defined types were placed in this encounter.   All questions were answered. The patient knows to call the clinic with any problems, questions or concerns. The total time spent in the appointment was 40 minutes encounter with patients including review of chart and various tests results, discussions about plan of care and coordination of care plan   Artis Delay, MD 05/02/2023 3:41 PM  INTERVAL HISTORY: Please see below for problem oriented charting. she returns for treatment follow-up She was more than 1 hour late and she forgot her appointment With last dose of chemo, she developed profound deep leg pain and muscle pain She denies worsening peripheral neuropathy Her abdominal symptoms are stable She denies recent constipation  REVIEW OF SYSTEMS:   Constitutional: Denies fevers, chills or abnormal weight loss Eyes: Denies blurriness of vision Ears, nose, mouth, throat, and face: Denies mucositis or sore throat Respiratory: Denies cough, dyspnea or wheezes Cardiovascular: Denies palpitation, chest discomfort or lower  extremity swelling Gastrointestinal:  Denies nausea, heartburn or change in bowel habits Skin: Denies abnormal skin rashes Lymphatics: Denies new lymphadenopathy or easy bruising Neurological:Denies numbness, tingling or new weaknesses Behavioral/Psych: Mood is stable, no new changes  All other systems were reviewed with the patient and are negative.  I have reviewed the past medical history, past surgical history, social history and family history with the patient and they are unchanged from previous note.  ALLERGIES:  is allergic to codeine sulfate, penicillins, percocet [oxycodone-acetaminophen], and zestril [lisinopril].  MEDICATIONS:  Current Outpatient Medications  Medication Sig Dispense Refill   acetaminophen (TYLENOL) 500 MG tablet Take 1,000 mg by mouth every 6 (six) hours as needed for mild pain.     albuterol (VENTOLIN HFA) 108 (90 Base) MCG/ACT inhaler Inhale 2 puffs into the lungs every 4 (four) hours as needed for wheezing or shortness of breath. 8 g 0   atorvastatin (LIPITOR) 20 MG tablet Take 1 tablet (20 mg total) by mouth at bedtime. 30 tablet 11   B Complex-C (B-COMPLEX WITH VITAMIN C) tablet Take 1 tablet by mouth in the morning.     Blood Glucose Monitoring Suppl (ONE TOUCH ULTRA MINI) w/Device KIT Please use as directed. 1 each 0   Cholecalciferol 1000 units capsule Take 1 capsule (1,000 Units total) by mouth daily. 90 capsule 1   dexamethasone (DECADRON) 4 MG tablet Take 1 tablet (4 mg total) by mouth daily. 30 tablet 6   diltiazem (CARDIZEM CD) 120 MG 24 hr capsule Take 1 capsule (120 mg total) by mouth daily. 30 capsule 11   dorzolamide-timolol (COSOPT) 22.3-6.8 MG/ML ophthalmic solution Place 1 drop into both eyes 2 (two) times daily.  glucose blood (ONE TOUCH ULTRA TEST) test strip Use as instructed 100 each 1   hydrALAZINE (APRESOLINE) 25 MG tablet TAKE 1 TABLET BY MOUTH THREE TIMES DAILY 90 tablet 3   HYDROmorphone (DILAUDID) 2 MG tablet Take 1 tablet (2 mg)  by mouth 3 times daily as needed for severe pain. 30 tablet 0   Insulin Pen Needle 32G X 4 MM MISC 1 each by Does not apply route daily. 100 each 3   latanoprost (XALATAN) 0.005 % ophthalmic solution Place 1 drop into both eyes at bedtime.     lidocaine-prilocaine (EMLA) cream Apply to affected area once as directed 30 g 3   losartan (COZAAR) 100 MG tablet Take 1 tablet (100 mg total) by mouth at bedtime. 90 tablet 3   magnesium oxide (MAG-OX) 400 (240 Mg) MG tablet Take 1 tablet (400 mg total) by mouth 2 (two) times daily. 60 tablet 1   metFORMIN (GLUCOPHAGE) 1000 MG tablet Take 1 tablet (1,000 mg total) by mouth 2 (two) times daily. 180 tablet 3   ondansetron (ZOFRAN) 8 MG tablet Take 1 tablet (8 mg total) by mouth every 8 (eight) hours as needed for nausea or vomiting. Start on the third day after chemotherapy. 30 tablet 1   ONETOUCH DELICA LANCETS 33G MISC Please use as directed. 100 each 0   pantoprazole (PROTONIX) 40 MG tablet Take 1 tablet by mouth daily. 360 tablet 0   polyethylene glycol powder (GLYCOLAX/MIRALAX) 17 GM/SCOOP powder Take 17 g by mouth daily. 238 g 1   prochlorperazine (COMPAZINE) 10 MG tablet Take 1 tablet (10 mg total) by mouth every 6 (six) hours as needed for nausea or vomiting. 30 tablet 1   rivaroxaban (XARELTO) 20 MG TABS tablet Take 1 tablet (20 mg total) by mouth daily with supper. 90 tablet 1   No current facility-administered medications for this visit.   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 Heterogeneous endometrial thickening with appearance of moderate complex fluid in the  endometrial canal, possible hemorrhagic material. Endometrial thickness is considered abnormal for an asymptomatic post-menopausal female. Endometrial sampling should be considered to exclude carcinoma. 2. Nonvisualized ovary 3. Heterogeneous thick-walled urinary bladder, correlate for cystitis.   12/28/2021 Pathology Results   CYTOLOGY - NON PAP  CASE: [id number]  PATIENT: Sherri Gill  Non-Gynecological Cytology Report   Clinical History: Abnormal pelvic US, highly suspicious of malignant ascites  Specimen Submitted:  A. ASCITES, PARACENTESIS:    FINAL MICROSCOPIC DIAGNOSIS:  - Malignant cells consistent with adenocarcinoma  - See comment   SPECIMEN ADEQUACY:  Satisfactory for evaluation   DIAGNOSTIC COMMENTS:  Immunohistochemical stains show that the tumor cells are positive for CK7 and PAX8 while they are negative for CK20, CDX2 and ER, consistent with above interpretation.  Additionally, the tumor cells are diffusely positive for p53 (clonal overexpression), suggestive of a high-grade serous carcinoma.    12/28/2021 Imaging   US pelvis 1. Persistent abnormal appearance of the endometrium, heterogeneously thickened and complex with possible intraluminal fluid. This is similar to that seen on December 2022 ultrasound. Endometrial thickness is considered abnormal  for an asymptomatic post-menopausal female. Endometrial sampling should be considered to exclude carcinoma. 2. Nonvisualization of the ovaries.   12/28/2021 Imaging   1. Large volume ascites. 2. Small umbilical hernia containing fat and ascites. 3. Haziness of anterior omentum most likely related to ascites, other etiologies such as metastatic disease can not be excluded. 4. Diffuse colonic diverticulosis without evidence for diverticulitis.   12/29/2021 Procedure   Successful ultrasound-guided paracentesis yielding 2.8 liters of peritoneal fluid.   01/14/2022 Initial Diagnosis   Uterine cancer (HCC)   01/14/2022 Cancer  Staging   Staging form: Corpus Uteri - Carcinoma and Carcinosarcoma, AJCC 8th Edition - Clinical: Stage III (cT3, cN0, cM0) - Signed by Artis Delay, MD on 01/14/2022 Stage prefix: Initial diagnosis   01/15/2022 Surgery   Surgery: Cervical dilation under ultrasound guidance, hysteroscopy, endometrial sampling using the Myosure   Surgeons:  Carin Hock MD    Pathology: endometrial curetteings   Operative findings: On EUA, 8 cm mobile uterus, some nodularity along cul de sac. Cervix normal in appearance without discernible external os. Difficulty noted in dilating the cervix after using scalpel to incise in area of what what thought to be the external os. Under ultrasound guidance, cervix was dilated with confirmation on imaging of intra-uterine placement of dilators. On hysteroscopy, abnormal appearing endocervix and very calcified tissue obscuring good visualization of endometrium concerning for replacement by tumor.    01/15/2022 Pathology Results   FINAL MICROSCOPIC DIAGNOSIS:   A.   ENDOMETRIUM, CURETTAGE:  -    Serous endometrial carcinoma.   COMMENT:   The carcinoma is strongly and diffusely positive for p53 and diffuse p16 positivity.  The ER shows relatively diffuse positivity.  Overall, the histologic features (slit-like glandular spaces and psammoma bodies) and the immunohistochemical phenotype are most characteristic for a serous carcinoma.    01/21/2022 Procedure   Placement of single lumen port a cath via right internal jugular vein. The catheter tip lies at the cavo-atrial junction. A power injectable port a cath was placed and is ready for immediate use.     01/25/2022 - 06/21/2022 Chemotherapy   Patient is on Treatment Plan : UTERINE Carboplatin AUC 6 / Paclitaxel q21d     03/21/2022 Imaging   1. Near complete resolution of previously noted ascites, with only small volume residual perihepatic ascites. 2. Improved omental caking and stranding in the ventral abdomen. 3.  Findings are consistent with treatment response of peritoneal metastatic disease. 4. Thickening of the urinary bladder wall , consistent with nonspecific infectious or inflammatory cystitis. Correlate with urinalysis.   Aortic Atherosclerosis (ICD10-I70.0).       03/22/2022 Tumor Marker   Patient's tumor was tested for the following markers: CA-125. Results of the tumor marker test revealed 21.5.   04/09/2022 Surgery     Surgery: Diagnostic laparoscopy, conversion to exploratory laparotomy, partial omentectomy, lysis of adhesions for approximately 60 minutes, total abdominal hysterectomy, bilateral salpingo-oophorectomy, excision of ileal tumor implant and excision of transverse colon epiploica tumor implant   04/09/2022 Pathology Results   A.   OMENTUM, RESECTION:  -    Metastatic serous carcinoma.   B.   ILEAL TUMOR IMPLANTS, EXCISION:  -    Adhesions with small focus of metastatic serous carcinoma, possibly  intralymphatic.   C.   UTERUS, CERVIX, BILATERAL FALLOPIAN TUBES AND OVARIES:  -    Serous carcinoma, endometrial, invasive into outer half of uterus,  with involvement of  cervical stroma, uterine serosa, ovaries and fallopian tubes.   -  Negative for cervical dysplasia.  -    Leiomyomas, largest 1.2 cm in greatest dimension.   D.   COLON, MESENTARY NODULE, TRANSVERSE, EXCISION:  -    Metastatic serous carcinoma   05/09/2022 Tumor Marker   Patient's tumor was tested for the following markers: CA-125. Results of the tumor marker test revealed 12.1.   05/29/2022 Tumor Marker   Patient's tumor was tested for the following markers: CA-125. Results of the tumor marker test revealed 9.1.   07/23/2022 Imaging   1. No abdominopelvic lymphadenopathy.  2. Scattered foci of fat stranding in the anterior pelvic peritoneal fat without discrete solid implants. Mild smooth wall thickening of the pelvic peritoneum. These findings are nonspecific and could represent postsurgical/post  treatment change, although residual peritoneal carcinomatosis is on the differential and close follow-up CT is suggested. 3. Moderate left colonic diverticulosis. 4. Aortic Atherosclerosis (ICD10-I70.0).   07/23/2022 Tumor Marker   Patient's tumor was tested for the following markers: CA-125. Results of the tumor marker test revealed 5.2.   09/09/2022 Tumor Marker   Patient's tumor was tested for the following markers: CA-125. Results of the tumor marker test revealed 10.1.   01/28/2023 Imaging   OMENTAL AND PERITONEAL CARCINOMATOSIS.   POTENTIAL SOURCES ARE GASTRIC (MID AND DISTAL GASTRIC WALL THICKENING) OR GENITOURINARY (SOFT TISSUE MASS EXTENDS CEPHALAD FROM THE LEFT CERVICAL CUFF TOWARDS THE LEFT ADNEXA).   IF GASTRIC, THE PELVIC MASS WOULD REPRESENT AN OMENTAL DEPOSIT. PELVIC MASS OBSTRUCTING LEFT KIDNEY, MILD HYDROURETERONEPHROSIS.   MEDIASTINAL ADENOPATHY, LIKELY NODAL DISEASE.    02/03/2023 Tumor Marker   Patient's tumor was tested for the following markers: CA-125. Results of the tumor marker test revealed 429.   02/07/2023 -  Chemotherapy   Patient is on Treatment Plan : UTERINE ENDOMETRIAL Dostarlimab-gxly (500 mg) + Carboplatin (AUC 5) + Paclitaxel (175 mg/m2) q21d x 6 cycles / Dostarlimab-gxly (1000 mg) q42d x 6 cycles      03/03/2023 Tumor Marker   Patient's tumor was tested for the following markers: CA-125. Results of the tumor marker test revealed 176.   04/09/2023 Imaging   CT CHEST ABDOMEN PELVIS W CONTRAST  Result Date: 04/08/2023 CLINICAL DATA:  Endometrial cancer/uterine cancer. Evaluate treatment response to chemotherapy. * Tracking Code: BO * EXAM: CT CHEST, ABDOMEN, AND PELVIS WITH CONTRAST TECHNIQUE: Multidetector CT imaging of the chest, abdomen and pelvis was performed following the standard protocol during bolus administration of intravenous contrast. RADIATION DOSE REDUCTION: This exam was performed according to the departmental dose-optimization program which  includes automated exposure control, adjustment of the mA and/or kV according to patient size and/or use of iterative reconstruction technique. CONTRAST:  OMNIPAQUE IOHEXOL 300 MG/ML  SOLN COMPARISON:  Outside CTs of 01/28/2023. Report available in the Care everywhere epic system. FINDINGS: CT CHEST FINDINGS Cardiovascular: Right Port-A-Cath tip mid right atrium. Bovine arch. Aortic atherosclerosis. Mild cardiomegaly, without pericardial effusion. No central pulmonary embolism, on this non-dedicated study. Mediastinum/Nodes: No supraclavicular adenopathy. Right paratracheal node measures 7 mm and 16/2 versus 11 mm on the prior exam (when remeasured). No hilar adenopathy. Resolution of prevascular adenopathy with an index node measuring 5 mm on 18/2 versus up to 13 mm on the prior exam (when remeasured). Lungs/Pleura: No pleural fluid.  Clear lungs. Musculoskeletal: Although lack of sagittal reformats on the prior exam makes direct comparison challenging, there are sclerotic lesions within the T8 and T2 vertebral bodies on sagittal image 99 which are felt to be new since the prior. CT ABDOMEN PELVIS FINDINGS Hepatobiliary:  No focal liver lesion. Normal gallbladder, without biliary ductal dilatation. Pancreas: Normal, without mass or ductal dilatation. Spleen: Normal in size, without focal abnormality. Adrenals/Urinary Tract: Normal adrenal glands. Normal kidneys, without hydronephrosis. The bladder is decompressed. Apparent wall thickening may be secondary. Stomach/Bowel: The gastric antrum is underdistended and appears thick walled including at 1.5 cm on 59/2. Similar 1.6 cm on the prior. Colonic stool burden suggests constipation. Scattered colonic diverticula. Normal terminal ileum and appendix. Borderline prominent upper pelvic fluid-filled small bowel loops of up to 2.5 cm. No proximal small bowel distension. Vascular/Lymphatic: Aortic atherosclerosis. No abdominal adenopathy. A left external iliac node  measures 11 mm on 101/2 is similar to on the prior exam (when remeasured). Decreased size of tiny left obturator nodes including up to 4 mm today versus 9 mm on the prior. Reproductive: Hysterectomy. Other: Omental thickening/nodularity again identified. Example right abdomen at 9 mm on 74/2 versus 14 mm on the prior exam (when remeasured). Soft tissue fullness about the superior aspect of the left vaginal cuff is less well-defined today. Example at on the order of 3.3 x 2.4 cm on 97/2 versus 5.3 x 5.0 cm when remeasured in a similar fashion on the prior. Mild ventral/periumbilical hernia or laxity contains fat and peritoneal tumor, similar on 77/2. Musculoskeletal: No acute osseous abnormality. IMPRESSION: 1. Response to therapy since outside CTs of 01/28/2023. Improvement in abdominopelvic nodal/peritoneal metastasis. Resolution of thoracic adenopathy. 2. Developing sclerotic lesions within the thoracic spine which given response to therapy elsewhere likely represent treated previously CT occult metastasis. 3. Areas of borderline to mild small bowel dilatation within the upper pelvis could represent ileus or low-grade partial obstruction, given peritoneal disease. 4.  Aortic Atherosclerosis (ICD10-I70.0). 5. Similar gastric antral thickening in the setting of underdistention. Suspicious for gastritis. Electronically Signed   By: Jeronimo Greaves M.D.   On: 04/08/2023 16:29      04/14/2023 Tumor Marker   Patient's tumor was tested for the following markers: CA-125. Results of the tumor marker test revealed 100.     PHYSICAL EXAMINATION: ECOG PERFORMANCE STATUS: 2 - Symptomatic, <50% confined to bed  Vitals:   05/02/23 0913  BP: (!) 173/63  Pulse: 88  Resp: (!) 22  Temp: (!) 97.3 F (36.3 C)  SpO2: 100%   Filed Weights   05/02/23 0913  Weight: 180 lb (81.6 kg)    GENERAL:alert, no distress and comfortable NEURO: alert & oriented x 3 with fluent speech, no focal motor/sensory  deficits  LABORATORY DATA:  I have reviewed the data as listed    Component Value Date/Time   NA 138 05/02/2023 0844   NA 140 03/25/2023 1541   K 4.2 05/02/2023 0844   CL 103 05/02/2023 0844   CO2 24 05/02/2023 0844   GLUCOSE 157 (H) 05/02/2023 0844   GLUCOSE 93 09/10/2006 0834   BUN 24 (H) 05/02/2023 0844   BUN 12 03/25/2023 1541   CREATININE 1.33 (H) 05/02/2023 0844   CREATININE 0.64 07/13/2014 1621   CALCIUM 9.9 05/02/2023 0844   CALCIUM 9.7 03/29/2010 1554   PROT 6.6 05/02/2023 0844   PROT 7.6 11/09/2020 1156   ALBUMIN 3.8 05/02/2023 0844   ALBUMIN 4.4 11/09/2020 1156   AST 9 (L) 05/02/2023 0844   ALT 6 05/02/2023 0844   ALKPHOS 136 (H) 05/02/2023 0844   BILITOT 0.4 05/02/2023 0844   GFRNONAA 43 (L) 05/02/2023 0844   GFRNONAA >89 07/13/2014 1621   GFRAA 86 11/09/2020 1156   GFRAA >89 07/13/2014 1621  No results found for: "SPEP", "UPEP"  Lab Results  Component Value Date   WBC 7.4 05/02/2023   NEUTROABS 6.1 05/02/2023   HGB 8.6 (L) 05/02/2023   HCT 26.5 (L) 05/02/2023   MCV 84.4 05/02/2023   PLT 300 05/02/2023      Chemistry      Component Value Date/Time   NA 138 05/02/2023 0844   NA 140 03/25/2023 1541   K 4.2 05/02/2023 0844   CL 103 05/02/2023 0844   CO2 24 05/02/2023 0844   BUN 24 (H) 05/02/2023 0844   BUN 12 03/25/2023 1541   CREATININE 1.33 (H) 05/02/2023 0844   CREATININE 0.64 07/13/2014 1621      Component Value Date/Time   CALCIUM 9.9 05/02/2023 0844   CALCIUM 9.7 03/29/2010 1554   ALKPHOS 136 (H) 05/02/2023 0844   AST 9 (L) 05/02/2023 0844   ALT 6 05/02/2023 0844   BILITOT 0.4 05/02/2023 0844       RADIOGRAPHIC STUDIES: I have personally reviewed the radiological images as listed and agreed with the findings in the report. CT CHEST ABDOMEN PELVIS W CONTRAST  Result Date: 04/08/2023 CLINICAL DATA:  Endometrial cancer/uterine cancer. Evaluate treatment response to chemotherapy. * Tracking Code: BO * EXAM: CT CHEST, ABDOMEN, AND  PELVIS WITH CONTRAST TECHNIQUE: Multidetector CT imaging of the chest, abdomen and pelvis was performed following the standard protocol during bolus administration of intravenous contrast. RADIATION DOSE REDUCTION: This exam was performed according to the departmental dose-optimization program which includes automated exposure control, adjustment of the mA and/or kV according to patient size and/or use of iterative reconstruction technique. CONTRAST:  OMNIPAQUE IOHEXOL 300 MG/ML  SOLN COMPARISON:  Outside CTs of 01/28/2023. Report available in the Care everywhere epic system. FINDINGS: CT CHEST FINDINGS Cardiovascular: Right Port-A-Cath tip mid right atrium. Bovine arch. Aortic atherosclerosis. Mild cardiomegaly, without pericardial effusion. No central pulmonary embolism, on this non-dedicated study. Mediastinum/Nodes: No supraclavicular adenopathy. Right paratracheal node measures 7 mm and 16/2 versus 11 mm on the prior exam (when remeasured). No hilar adenopathy. Resolution of prevascular adenopathy with an index node measuring 5 mm on 18/2 versus up to 13 mm on the prior exam (when remeasured). Lungs/Pleura: No pleural fluid.  Clear lungs. Musculoskeletal: Although lack of sagittal reformats on the prior exam makes direct comparison challenging, there are sclerotic lesions within the T8 and T2 vertebral bodies on sagittal image 99 which are felt to be new since the prior. CT ABDOMEN PELVIS FINDINGS Hepatobiliary: No focal liver lesion. Normal gallbladder, without biliary ductal dilatation. Pancreas: Normal, without mass or ductal dilatation. Spleen: Normal in size, without focal abnormality. Adrenals/Urinary Tract: Normal adrenal glands. Normal kidneys, without hydronephrosis. The bladder is decompressed. Apparent wall thickening may be secondary. Stomach/Bowel: The gastric antrum is underdistended and appears thick walled including at 1.5 cm on 59/2. Similar 1.6 cm on the prior. Colonic stool burden  suggests constipation. Scattered colonic diverticula. Normal terminal ileum and appendix. Borderline prominent upper pelvic fluid-filled small bowel loops of up to 2.5 cm. No proximal small bowel distension. Vascular/Lymphatic: Aortic atherosclerosis. No abdominal adenopathy. A left external iliac node measures 11 mm on 101/2 is similar to on the prior exam (when remeasured). Decreased size of tiny left obturator nodes including up to 4 mm today versus 9 mm on the prior. Reproductive: Hysterectomy. Other: Omental thickening/nodularity again identified. Example right abdomen at 9 mm on 74/2 versus 14 mm on the prior exam (when remeasured). Soft tissue fullness about the superior aspect of the  left vaginal cuff is less well-defined today. Example at on the order of 3.3 x 2.4 cm on 97/2 versus 5.3 x 5.0 cm when remeasured in a similar fashion on the prior. Mild ventral/periumbilical hernia or laxity contains fat and peritoneal tumor, similar on 77/2. Musculoskeletal: No acute osseous abnormality. IMPRESSION: 1. Response to therapy since outside CTs of 01/28/2023. Improvement in abdominopelvic nodal/peritoneal metastasis. Resolution of thoracic adenopathy. 2. Developing sclerotic lesions within the thoracic spine which given response to therapy elsewhere likely represent treated previously CT occult metastasis. 3. Areas of borderline to mild small bowel dilatation within the upper pelvis could represent ileus or low-grade partial obstruction, given peritoneal disease. 4.  Aortic Atherosclerosis (ICD10-I70.0). 5. Similar gastric antral thickening in the setting of underdistention. Suspicious for gastritis. Electronically Signed   By: Jeronimo Greaves M.D.   On: 04/08/2023 16:29

## 2023-05-03 ENCOUNTER — Other Ambulatory Visit (HOSPITAL_COMMUNITY): Payer: Self-pay

## 2023-05-03 ENCOUNTER — Other Ambulatory Visit: Payer: Self-pay | Admitting: Hematology and Oncology

## 2023-05-03 DIAGNOSIS — G8918 Other acute postprocedural pain: Secondary | ICD-10-CM

## 2023-05-03 DIAGNOSIS — C55 Malignant neoplasm of uterus, part unspecified: Secondary | ICD-10-CM

## 2023-05-03 LAB — CA 125: Cancer Antigen (CA) 125: 82.2 U/mL — ABNORMAL HIGH (ref 0.0–38.1)

## 2023-05-04 ENCOUNTER — Encounter: Payer: Self-pay | Admitting: Hematology and Oncology

## 2023-05-10 ENCOUNTER — Other Ambulatory Visit (HOSPITAL_COMMUNITY): Payer: Self-pay

## 2023-05-21 ENCOUNTER — Telehealth: Payer: Self-pay

## 2023-05-21 NOTE — Patient Outreach (Signed)
  Care Coordination   Note   05/21/2023 Name: Sherri Gill MRN: [id number] DOB: 1951/09/04  Received call from patient who scheduled an appointment at [facility] for her son next week.  Patient is requesting a female doctor for her son's appointment.  Collaborated with Louretta Parma, front desk staff and she moved the [name] son into Dr. Deirdre Pippins schedule for the same time/date.  [name] was pleased with the change.  Jodelle Gross, RN, BSN, CCM Care Management Coordinator [place] Phone: [phone]/Fax: [phone]

## 2023-05-27 ENCOUNTER — Telehealth: Payer: Self-pay

## 2023-05-27 NOTE — Telephone Encounter (Signed)
Returned her call. She started having left sided abdominal cramping this am. She ate cabbage last night and thinks that it may be gas pains. She took a dilaudid and the pain is getting better. She denies constipation. Last bm today and she is taking laxatives.  She will call the office back for questions/ concerns. Reminded of appt on 8/2.  FYI

## 2023-05-29 MED FILL — Fosaprepitant Dimeglumine For IV Infusion 150 MG (Base Eq): INTRAVENOUS | Qty: 5 | Status: AC

## 2023-05-29 MED FILL — Dexamethasone Sodium Phosphate Inj 100 MG/10ML: INTRAMUSCULAR | Qty: 1 | Status: AC

## 2023-05-30 ENCOUNTER — Inpatient Hospital Stay: Payer: Medicare HMO | Attending: Hematology and Oncology

## 2023-05-30 ENCOUNTER — Inpatient Hospital Stay: Payer: Medicare HMO

## 2023-05-30 ENCOUNTER — Inpatient Hospital Stay: Payer: Medicare HMO | Attending: Hematology and Oncology | Admitting: Hematology and Oncology

## 2023-05-30 ENCOUNTER — Other Ambulatory Visit: Payer: Self-pay

## 2023-05-30 ENCOUNTER — Encounter: Payer: Self-pay | Admitting: Hematology and Oncology

## 2023-05-30 ENCOUNTER — Other Ambulatory Visit: Payer: Self-pay | Admitting: Hematology and Oncology

## 2023-05-30 VITALS — BP 141/66 | HR 84 | Resp 16

## 2023-05-30 DIAGNOSIS — G893 Neoplasm related pain (acute) (chronic): Secondary | ICD-10-CM | POA: Diagnosis not present

## 2023-05-30 DIAGNOSIS — Z885 Allergy status to narcotic agent status: Secondary | ICD-10-CM | POA: Diagnosis not present

## 2023-05-30 DIAGNOSIS — M79604 Pain in right leg: Secondary | ICD-10-CM | POA: Diagnosis not present

## 2023-05-30 DIAGNOSIS — C786 Secondary malignant neoplasm of retroperitoneum and peritoneum: Secondary | ICD-10-CM | POA: Insufficient documentation

## 2023-05-30 DIAGNOSIS — Z5111 Encounter for antineoplastic chemotherapy: Secondary | ICD-10-CM | POA: Diagnosis not present

## 2023-05-30 DIAGNOSIS — C55 Malignant neoplasm of uterus, part unspecified: Secondary | ICD-10-CM | POA: Diagnosis not present

## 2023-05-30 DIAGNOSIS — M79605 Pain in left leg: Secondary | ICD-10-CM | POA: Insufficient documentation

## 2023-05-30 DIAGNOSIS — K5730 Diverticulosis of large intestine without perforation or abscess without bleeding: Secondary | ICD-10-CM | POA: Diagnosis not present

## 2023-05-30 DIAGNOSIS — R188 Other ascites: Secondary | ICD-10-CM | POA: Insufficient documentation

## 2023-05-30 DIAGNOSIS — D508 Other iron deficiency anemias: Secondary | ICD-10-CM

## 2023-05-30 DIAGNOSIS — Z880 Allergy status to penicillin: Secondary | ICD-10-CM | POA: Diagnosis not present

## 2023-05-30 DIAGNOSIS — E538 Deficiency of other specified B group vitamins: Secondary | ICD-10-CM | POA: Diagnosis not present

## 2023-05-30 DIAGNOSIS — T451X5A Adverse effect of antineoplastic and immunosuppressive drugs, initial encounter: Secondary | ICD-10-CM | POA: Diagnosis not present

## 2023-05-30 DIAGNOSIS — K429 Umbilical hernia without obstruction or gangrene: Secondary | ICD-10-CM | POA: Diagnosis not present

## 2023-05-30 DIAGNOSIS — Z5112 Encounter for antineoplastic immunotherapy: Secondary | ICD-10-CM | POA: Diagnosis not present

## 2023-05-30 DIAGNOSIS — R109 Unspecified abdominal pain: Secondary | ICD-10-CM | POA: Diagnosis not present

## 2023-05-30 DIAGNOSIS — I700 Atherosclerosis of aorta: Secondary | ICD-10-CM | POA: Diagnosis not present

## 2023-05-30 DIAGNOSIS — Z7901 Long term (current) use of anticoagulants: Secondary | ICD-10-CM | POA: Insufficient documentation

## 2023-05-30 DIAGNOSIS — K5909 Other constipation: Secondary | ICD-10-CM | POA: Insufficient documentation

## 2023-05-30 DIAGNOSIS — Z888 Allergy status to other drugs, medicaments and biological substances status: Secondary | ICD-10-CM | POA: Insufficient documentation

## 2023-05-30 DIAGNOSIS — G629 Polyneuropathy, unspecified: Secondary | ICD-10-CM | POA: Insufficient documentation

## 2023-05-30 DIAGNOSIS — D6481 Anemia due to antineoplastic chemotherapy: Secondary | ICD-10-CM

## 2023-05-30 DIAGNOSIS — Z79899 Other long term (current) drug therapy: Secondary | ICD-10-CM | POA: Diagnosis not present

## 2023-05-30 DIAGNOSIS — I517 Cardiomegaly: Secondary | ICD-10-CM | POA: Diagnosis not present

## 2023-05-30 LAB — CMP (CANCER CENTER ONLY)
ALT: 6 U/L (ref 0–44)
AST: 9 U/L — ABNORMAL LOW (ref 15–41)
Albumin: 4.3 g/dL (ref 3.5–5.0)
Alkaline Phosphatase: 164 U/L — ABNORMAL HIGH (ref 38–126)
Anion gap: 9 (ref 5–15)
BUN: 20 mg/dL (ref 8–23)
CO2: 25 mmol/L (ref 22–32)
Calcium: 9.7 mg/dL (ref 8.9–10.3)
Chloride: 107 mmol/L (ref 98–111)
Creatinine: 0.92 mg/dL (ref 0.44–1.00)
GFR, Estimated: >60 mL/min (ref >60)
Glucose, Bld: 162 mg/dL — ABNORMAL HIGH (ref 70–99)
Potassium: 3.8 mmol/L (ref 3.5–5.1)
Sodium: 141 mmol/L (ref 135–145)
Total Bilirubin: 0.3 mg/dL (ref 0.3–1.2)
Total Protein: 7.6 g/dL (ref 6.5–8.1)

## 2023-05-30 LAB — CBC WITH DIFFERENTIAL (CANCER CENTER ONLY)
Abs Immature Granulocytes: 0.05 10*3/uL (ref 0.00–0.07)
Basophils Absolute: 0 10*3/uL (ref 0.0–0.1)
Basophils Relative: 0 %
Eosinophils Absolute: 0 10*3/uL (ref 0.0–0.5)
Eosinophils Relative: 0 %
HCT: 26.9 % — ABNORMAL LOW (ref 36.0–46.0)
Hemoglobin: 8.7 g/dL — ABNORMAL LOW (ref 12.0–15.0)
Immature Granulocytes: 1 %
Lymphocytes Relative: 11 %
Lymphs Abs: 0.7 10*3/uL (ref 0.7–4.0)
MCH: 28.2 pg (ref 26.0–34.0)
MCHC: 32.3 g/dL (ref 30.0–36.0)
MCV: 87.1 fL (ref 80.0–100.0)
Monocytes Absolute: 0.2 10*3/uL (ref 0.1–1.0)
Monocytes Relative: 3 %
Neutro Abs: 5.6 10*3/uL (ref 1.7–7.7)
Neutrophils Relative %: 85 %
Platelet Count: 240 10*3/uL (ref 150–400)
RBC: 3.09 MIL/uL — ABNORMAL LOW (ref 3.87–5.11)
RDW: 18.6 % — ABNORMAL HIGH (ref 11.5–15.5)
WBC Count: 6.5 10*3/uL (ref 4.0–10.5)
nRBC: 0 % (ref 0.0–0.2)

## 2023-05-30 LAB — MAGNESIUM: Magnesium: 1.2 mg/dL — ABNORMAL LOW (ref 1.7–2.4)

## 2023-05-30 LAB — TSH: TSH: 1.892 u[IU]/mL (ref 0.350–4.500)

## 2023-05-30 LAB — SAMPLE TO BLOOD BANK

## 2023-05-30 MED ORDER — CYANOCOBALAMIN 1000 MCG/ML IJ SOLN
1000.0000 ug | Freq: Once | INTRAMUSCULAR | Status: AC
  Administered 2023-05-30: 1000 ug via INTRAMUSCULAR
  Filled 2023-05-30: qty 1

## 2023-05-30 MED ORDER — FAMOTIDINE 20 MG PO TABS
20.0000 mg | ORAL_TABLET | Freq: Once | ORAL | Status: AC
  Administered 2023-05-30: 20 mg via ORAL
  Filled 2023-05-30: qty 1

## 2023-05-30 MED ORDER — PALONOSETRON HCL INJECTION 0.25 MG/5ML
0.2500 mg | Freq: Once | INTRAVENOUS | Status: AC
  Administered 2023-05-30: 0.25 mg via INTRAVENOUS
  Filled 2023-05-30: qty 5

## 2023-05-30 MED ORDER — SODIUM CHLORIDE 0.9% FLUSH: 3.0000 mL | INTRAVENOUS | Status: DC | PRN

## 2023-05-30 MED ORDER — CETIRIZINE HCL 10 MG/ML IV SOLN
10.0000 mg | Freq: Once | INTRAVENOUS | Status: DC
  Filled 2023-05-30: qty 1

## 2023-05-30 MED ORDER — SODIUM CHLORIDE 0.9 % IV SOLN
150.0000 mg | Freq: Once | INTRAVENOUS | Status: AC
  Administered 2023-05-30: 150 mg via INTRAVENOUS
  Filled 2023-05-30: qty 150

## 2023-05-30 MED ORDER — FAMOTIDINE IN NACL 20-0.9 MG/50ML-% IV SOLN
20.0000 mg | Freq: Once | INTRAVENOUS | Status: AC
  Administered 2023-05-30: 20 mg via INTRAVENOUS
  Filled 2023-05-30: qty 50

## 2023-05-30 MED ORDER — SODIUM CHLORIDE 0.9% FLUSH
10.0000 mL | Freq: Once | INTRAVENOUS | Status: AC
  Administered 2023-05-30: 10 mL

## 2023-05-30 MED ORDER — SODIUM CHLORIDE 0.9 % IV SOLN
500.0000 mg | Freq: Once | INTRAVENOUS | Status: AC
  Administered 2023-05-30: 500 mg via INTRAVENOUS
  Filled 2023-05-30: qty 10

## 2023-05-30 MED ORDER — MAGNESIUM SULFATE 2 GM/50ML IV SOLN
2.0000 g | Freq: Once | INTRAVENOUS | Status: AC
  Administered 2023-05-30: 2 g via INTRAVENOUS
  Filled 2023-05-30: qty 50

## 2023-05-30 MED ORDER — SODIUM CHLORIDE 0.9 % IV SOLN
10.0000 mg | Freq: Once | INTRAVENOUS | Status: AC
  Administered 2023-05-30: 10 mg via INTRAVENOUS
  Filled 2023-05-30: qty 10

## 2023-05-30 MED ORDER — HEPARIN SOD (PORK) LOCK FLUSH 100 UNIT/ML IV SOLN
500.0000 [IU] | Freq: Once | INTRAVENOUS | Status: AC | PRN
  Administered 2023-05-30: 500 [IU]

## 2023-05-30 MED ORDER — SODIUM CHLORIDE 0.9 % IV SOLN: Freq: Once | INTRAVENOUS | Status: AC

## 2023-05-30 MED ORDER — SODIUM CHLORIDE 0.9% FLUSH
10.0000 mL | INTRAVENOUS | Status: DC | PRN
  Administered 2023-05-30: 10 mL

## 2023-05-30 MED ORDER — SODIUM CHLORIDE 0.9 % IV SOLN
131.2500 mg/m2 | Freq: Once | INTRAVENOUS | Status: AC
  Administered 2023-05-30: 252 mg via INTRAVENOUS
  Filled 2023-05-30: qty 42

## 2023-05-30 MED ORDER — SODIUM CHLORIDE 0.9 % IV SOLN
373.6000 mg | Freq: Once | INTRAVENOUS | Status: AC
  Administered 2023-05-30: 370 mg via INTRAVENOUS
  Filled 2023-05-30: qty 37

## 2023-05-30 MED ORDER — DIPHENHYDRAMINE HCL 50 MG/ML IJ SOLN
25.0000 mg | Freq: Once | INTRAMUSCULAR | Status: AC
  Administered 2023-05-30: 25 mg via INTRAVENOUS
  Filled 2023-05-30: qty 1

## 2023-05-30 NOTE — Assessment & Plan Note (Signed)
Tolerated last cycle of treatment better with dose reduction and prescription pain medicine to take as needed We will continue treatment as scheduled with plan to repeat imaging study again in August before her next treatment

## 2023-05-30 NOTE — Progress Notes (Signed)
[facility] OFFICE PROGRESS NOTE  Patient Care Team: [name], MD as PCP - General (Internal Medicine) Rennis Golden Lisette Abu, MD as PCP - Cardiology (Cardiology)  ASSESSMENT & PLAN:  Uterine cancer [facility]) Tolerated last cycle of treatment better with dose reduction and prescription pain medicine to take as needed We will continue treatment as scheduled with plan to repeat imaging study again in August before her next treatment  Anemia due to antineoplastic chemotherapy She is stable, she is not symptomatic This is due to side effects of treatment As above, we will proceed with treatment  Bilateral leg pain She will continue to take Dilaudid as needed for few days after treatment  Other constipation She is prone to get constipated I recommend schedule laxative for few days after treatment  Orders Placed This Encounter  Procedures   CT CHEST ABDOMEN PELVIS W CONTRAST    Standing Status:   Future    Standing Expiration Date:   05/29/2024    Scheduling Instructions:     No need oral contrast    Order Specific Question:   If indicated for the ordered procedure, I authorize the administration of contrast media per Radiology protocol    Answer:   Yes    Order Specific Question:   Does the patient have a contrast media/X-ray dye allergy?    Answer:   No    Order Specific Question:   Preferred imaging location?    Answer:   [facility]    Order Specific Question:   If indicated for the ordered procedure, I authorize the administration of oral contrast media per Radiology protocol    Answer:   Yes   Magnesium    Standing Status:   Future    Number of Occurrences:   1    Standing Expiration Date:   05/29/2024    All questions were answered. The patient knows to call the clinic with any problems, questions or concerns. The total time spent in the appointment was 30 minutes encounter with patients including review of chart and various tests results,  discussions about plan of care and coordination of care plan   Artis Delay, MD 05/30/2023 9:06 AM  INTERVAL HISTORY: Please see below for problem oriented charting. she returns for treatment follow-up seen prior to treatment She tolerated last cycle better Denies peripheral neuropathy Her leg pain is improved with Dilaudid to be taken for 2 to 3 days after chemo Denies nausea We discussed future plan of care and timing of imaging  REVIEW OF SYSTEMS:   Constitutional: Denies fevers, chills or abnormal weight loss Eyes: Denies blurriness of vision Ears, nose, mouth, throat, and face: Denies mucositis or sore throat Respiratory: Denies cough, dyspnea or wheezes Cardiovascular: Denies palpitation, chest discomfort or lower extremity swelling Skin: Denies abnormal skin rashes Lymphatics: Denies new lymphadenopathy or easy bruising Behavioral/Psych: Mood is stable, no new changes  All other systems were reviewed with the patient and are negative.  I have reviewed the past medical history, past surgical history, social history and family history with the patient and they are unchanged from previous note.  ALLERGIES:  is allergic to codeine sulfate, penicillins, percocet [oxycodone-acetaminophen], and zestril [lisinopril].  MEDICATIONS:  Current Outpatient Medications  Medication Sig Dispense Refill   acetaminophen (TYLENOL) 500 MG tablet Take 1,000 mg by mouth every 6 (six) hours as needed for mild pain.     albuterol (VENTOLIN HFA) 108 (90 Base) MCG/ACT inhaler Inhale 2 puffs into the lungs  every 4 (four) hours as needed for wheezing or shortness of breath. 8 g 0   atorvastatin (LIPITOR) 20 MG tablet Take 1 tablet (20 mg total) by mouth at bedtime. 30 tablet 11   B Complex-C (B-COMPLEX WITH VITAMIN C) tablet Take 1 tablet by mouth in the morning.     Blood Glucose Monitoring Suppl (ONE TOUCH ULTRA MINI) w/Device KIT Please use as directed. 1 each 0   Cholecalciferol 1000 units capsule  Take 1 capsule (1,000 Units total) by mouth daily. 90 capsule 1   dexamethasone (DECADRON) 4 MG tablet Take 1 tablet (4 mg total) by mouth daily. 30 tablet 6   diltiazem (CARDIZEM CD) 120 MG 24 hr capsule Take 1 capsule (120 mg total) by mouth daily. 30 capsule 11   dorzolamide-timolol (COSOPT) 22.3-6.8 MG/ML ophthalmic solution Place 1 drop into both eyes 2 (two) times daily.     glucose blood (ONE TOUCH ULTRA TEST) test strip Use as instructed 100 each 1   hydrALAZINE (APRESOLINE) 25 MG tablet TAKE 1 TABLET BY MOUTH THREE TIMES DAILY 90 tablet 3   HYDROmorphone (DILAUDID) 2 MG tablet Take 1 tablet (2 mg) by mouth 3 times daily as needed for severe pain. 30 tablet 0   Insulin Pen Needle 32G X 4 MM MISC 1 each by Does not apply route daily. 100 each 3   latanoprost (XALATAN) 0.005 % ophthalmic solution Place 1 drop into both eyes at bedtime.     lidocaine-prilocaine (EMLA) cream Apply to affected area once as directed 30 g 3   losartan (COZAAR) 100 MG tablet Take 1 tablet (100 mg total) by mouth at bedtime. 90 tablet 3   magnesium oxide (MAG-OX) 400 (240 Mg) MG tablet Take 1 tablet (400 mg total) by mouth 2 (two) times daily. 60 tablet 1   metFORMIN (GLUCOPHAGE) 1000 MG tablet Take 1 tablet (1,000 mg total) by mouth 2 (two) times daily. 180 tablet 3   ondansetron (ZOFRAN) 8 MG tablet Take 1 tablet (8 mg total) by mouth every 8 (eight) hours as needed for nausea or vomiting. Start on the third day after chemotherapy. 30 tablet 1   ONETOUCH DELICA LANCETS 33G MISC Please use as directed. 100 each 0   pantoprazole (PROTONIX) 40 MG tablet Take 1 tablet by mouth daily. 360 tablet 0   polyethylene glycol powder (GLYCOLAX/MIRALAX) 17 GM/SCOOP powder Take 17 g by mouth daily. 238 g 1   prochlorperazine (COMPAZINE) 10 MG tablet Take 1 tablet (10 mg total) by mouth every 6 (six) hours as needed for nausea or vomiting. 30 tablet 1   rivaroxaban (XARELTO) 20 MG TABS tablet Take 1 tablet (20 mg total) by mouth  daily with supper. 90 tablet 1   No current facility-administered medications for this visit.   Facility-Administered Medications Ordered in Other Visits  Medication Dose Route Frequency Provider Last Rate Last Admin   0.9 %  sodium chloride infusion   Intravenous Once [name], , MD       CARBOplatin (PARAPLATIN) 370 mg in sodium chloride 0.9 % 100 mL chemo infusion  370 mg Intravenous Once [name], , MD       cetirizine (QUZYTTIR) injection 10 mg  10 mg Intravenous Once [name], , MD       dexamethasone (DECADRON) 10 mg in sodium chloride 0.9 % 50 mL IVPB  10 mg Intravenous Once , , MD       dostarlimab-gxly (JEMPERLI) 500 mg in sodium chloride 0.9 % 100 mL (4.5455 mg/mL)  chemo infusion  500 mg Intravenous Once [name], , MD       famotidine (PEPCID) IVPB 20 mg premix  20 mg Intravenous Once [name], , MD       fosaprepitant (EMEND) 150 mg in sodium chloride 0.9 % 145 mL IVPB  150 mg Intravenous Once [name], , MD       heparin lock flush 100 unit/mL  500 Units Intracatheter Once PRN [name], , MD       PACLitaxel (TAXOL) 252 mg in sodium chloride 0.9 % 250 mL chemo infusion (> 80mg /m2)  131.25 mg/m2 (Treatment Plan Recorded) Intravenous Once [name], , MD       palonosetron (ALOXI) injection 0.25 mg  0.25 mg Intravenous Once , , MD       sodium chloride flush (NS) 0.9 % injection 10 mL  10 mL Intracatheter PRN [name], , MD        SUMMARY OF ONCOLOGIC HISTORY: Oncology History Overview Note  High grade serous, 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 Heterogeneous endometrial thickening with appearance of moderate complex fluid in the endometrial canal, possible hemorrhagic material. Endometrial thickness is considered abnormal for an asymptomatic post-menopausal female. Endometrial sampling should be considered to exclude carcinoma. 2. Nonvisualized ovary 3. Heterogeneous thick-walled urinary  bladder, correlate for cystitis.   12/28/2021 Pathology Results   CYTOLOGY - NON PAP  CASE: [id number]  PATIENT: Glenette Dokes  Non-Gynecological Cytology Report   Clinical History: Abnormal pelvic US, highly suspicious of malignant ascites  Specimen Submitted:  A. ASCITES, PARACENTESIS:    FINAL MICROSCOPIC DIAGNOSIS:  - Malignant cells consistent with adenocarcinoma  - See comment   SPECIMEN ADEQUACY:  Satisfactory for evaluation   DIAGNOSTIC COMMENTS:  Immunohistochemical stains show that the tumor cells are positive for CK7 and PAX8 while they are negative for CK20, CDX2 and ER, consistent with above interpretation.  Additionally, the tumor cells are diffusely positive for p53 (clonal overexpression), suggestive of a high-grade serous carcinoma.    12/28/2021 Imaging   US pelvis 1. Persistent abnormal appearance of the endometrium, heterogeneously thickened and complex with possible intraluminal fluid. This is similar to that seen on December 2022 ultrasound. Endometrial thickness is considered abnormal for an asymptomatic post-menopausal female. Endometrial sampling should be considered to exclude carcinoma. 2. Nonvisualization of the ovaries.   12/28/2021 Imaging   1. Large volume ascites. 2. Small umbilical hernia containing fat and ascites. 3. Haziness of anterior omentum most likely related to ascites, other etiologies such as metastatic disease can not be excluded. 4. Diffuse colonic diverticulosis without evidence for diverticulitis.   12/29/2021 Procedure   Successful ultrasound-guided paracentesis yielding 2.8 liters of peritoneal fluid.   01/14/2022 Initial Diagnosis   Uterine cancer (HCC)   01/14/2022 Cancer Staging   Staging form: Corpus Uteri - Carcinoma and Carcinosarcoma, AJCC 8th Edition - Clinical: Stage III (cT3, cN0, cM0) - Signed by Artis Delay, MD on 01/14/2022 Stage prefix: Initial diagnosis   01/15/2022 Surgery   Surgery: Cervical dilation under  ultrasound guidance, hysteroscopy, endometrial sampling using the Myosure   Surgeons:  Carin Hock MD    Pathology: endometrial curetteings   Operative findings: On EUA, 8 cm mobile uterus, some nodularity along cul de sac. Cervix normal in appearance without discernible external os. Difficulty noted in dilating the cervix after using scalpel to incise in area of what what thought to be the external os. Under ultrasound guidance, cervix was dilated with  confirmation on imaging of intra-uterine placement of dilators. On hysteroscopy, abnormal appearing endocervix and very calcified tissue obscuring good visualization of endometrium concerning for replacement by tumor.    01/15/2022 Pathology Results   FINAL MICROSCOPIC DIAGNOSIS:   A.   ENDOMETRIUM, CURETTAGE:  -    Serous endometrial carcinoma.   COMMENT:   The carcinoma is strongly and diffusely positive for p53 and diffuse p16 positivity.  The ER shows relatively diffuse positivity.  Overall, the histologic features (slit-like glandular spaces and psammoma bodies) and the immunohistochemical phenotype are most characteristic for a serous carcinoma.    01/21/2022 Procedure   Placement of single lumen port a cath via right internal jugular vein. The catheter tip lies at the cavo-atrial junction. A power injectable port a cath was placed and is ready for immediate use.     01/25/2022 - 06/21/2022 Chemotherapy   Patient is on Treatment Plan : UTERINE Carboplatin AUC 6 / Paclitaxel q21d     03/21/2022 Imaging   1. Near complete resolution of previously noted ascites, with only small volume residual perihepatic ascites. 2. Improved omental caking and stranding in the ventral abdomen. 3. Findings are consistent with treatment response of peritoneal metastatic disease. 4. Thickening of the urinary bladder wall , consistent with nonspecific infectious or inflammatory cystitis. Correlate with urinalysis.   Aortic Atherosclerosis (ICD10-I70.0).        03/22/2022 Tumor Marker   Patient's tumor was tested for the following markers: CA-125. Results of the tumor marker test revealed 21.5.   04/09/2022 Surgery     Surgery: Diagnostic laparoscopy, conversion to exploratory laparotomy, partial omentectomy, lysis of adhesions for approximately 60 minutes, total abdominal hysterectomy, bilateral salpingo-oophorectomy, excision of ileal tumor implant and excision of transverse colon epiploica tumor implant   04/09/2022 Pathology Results   A.   OMENTUM, RESECTION:  -    Metastatic serous carcinoma.   B.   ILEAL TUMOR IMPLANTS, EXCISION:  -    Adhesions with small focus of metastatic serous carcinoma, possibly  intralymphatic.   C.   UTERUS, CERVIX, BILATERAL FALLOPIAN TUBES AND OVARIES:  -    Serous carcinoma, endometrial, invasive into outer half of uterus,  with involvement of  cervical stroma, uterine serosa, ovaries and fallopian tubes.   -    Negative for cervical dysplasia.  -    Leiomyomas, largest 1.2 cm in greatest dimension.   D.   COLON, MESENTARY NODULE, TRANSVERSE, EXCISION:  -    Metastatic serous carcinoma   05/09/2022 Tumor Marker   Patient's tumor was tested for the following markers: CA-125. Results of the tumor marker test revealed 12.1.   05/29/2022 Tumor Marker   Patient's tumor was tested for the following markers: CA-125. Results of the tumor marker test revealed 9.1.   07/23/2022 Imaging   1. No abdominopelvic lymphadenopathy.  2. Scattered foci of fat stranding in the anterior pelvic peritoneal fat without discrete solid implants. Mild smooth wall thickening of the pelvic peritoneum. These findings are nonspecific and could represent postsurgical/post treatment change, although residual peritoneal carcinomatosis is on the differential and close follow-up CT is suggested. 3. Moderate left colonic diverticulosis. 4. Aortic Atherosclerosis (ICD10-I70.0).   07/23/2022 Tumor Marker   Patient's tumor was tested for  the following markers: CA-125. Results of the tumor marker test revealed 5.2.   09/09/2022 Tumor Marker   Patient's tumor was tested for the following markers: CA-125. Results of the tumor marker test revealed 10.1.   01/28/2023 Imaging   OMENTAL AND PERITONEAL  CARCINOMATOSIS.   POTENTIAL SOURCES ARE GASTRIC (MID AND DISTAL GASTRIC WALL THICKENING) OR GENITOURINARY (SOFT TISSUE MASS EXTENDS CEPHALAD FROM THE LEFT CERVICAL CUFF TOWARDS THE LEFT ADNEXA).   IF GASTRIC, THE PELVIC MASS WOULD REPRESENT AN OMENTAL DEPOSIT. PELVIC MASS OBSTRUCTING LEFT KIDNEY, MILD HYDROURETERONEPHROSIS.   MEDIASTINAL ADENOPATHY, LIKELY NODAL DISEASE.    02/03/2023 Tumor Marker   Patient's tumor was tested for the following markers: CA-125. Results of the tumor marker test revealed 429.   02/07/2023 -  Chemotherapy   Patient is on Treatment Plan : UTERINE ENDOMETRIAL Dostarlimab-gxly (500 mg) + Carboplatin (AUC 5) + Paclitaxel (175 mg/m2) q21d x 6 cycles / Dostarlimab-gxly (1000 mg) q42d x 6 cycles      03/03/2023 Tumor Marker   Patient's tumor was tested for the following markers: CA-125. Results of the tumor marker test revealed 176.   04/09/2023 Imaging   CT CHEST ABDOMEN PELVIS W CONTRAST  Result Date: 04/08/2023 CLINICAL DATA:  Endometrial cancer/uterine cancer. Evaluate treatment response to chemotherapy. * Tracking Code: BO * EXAM: CT CHEST, ABDOMEN, AND PELVIS WITH CONTRAST TECHNIQUE: Multidetector CT imaging of the chest, abdomen and pelvis was performed following the standard protocol during bolus administration of intravenous contrast. RADIATION DOSE REDUCTION: This exam was performed according to the departmental dose-optimization program which includes automated exposure control, adjustment of the mA and/or kV according to patient size and/or use of iterative reconstruction technique. CONTRAST:  OMNIPAQUE IOHEXOL 300 MG/ML  SOLN COMPARISON:  Outside CTs of 01/28/2023. Report available in the Care  everywhere epic system. FINDINGS: CT CHEST FINDINGS Cardiovascular: Right Port-A-Cath tip mid right atrium. Bovine arch. Aortic atherosclerosis. Mild cardiomegaly, without pericardial effusion. No central pulmonary embolism, on this non-dedicated study. Mediastinum/Nodes: No supraclavicular adenopathy. Right paratracheal node measures 7 mm and 16/2 versus 11 mm on the prior exam (when remeasured). No hilar adenopathy. Resolution of prevascular adenopathy with an index node measuring 5 mm on 18/2 versus up to 13 mm on the prior exam (when remeasured). Lungs/Pleura: No pleural fluid.  Clear lungs. Musculoskeletal: Although lack of sagittal reformats on the prior exam makes direct comparison challenging, there are sclerotic lesions within the T8 and T2 vertebral bodies on sagittal image 99 which are felt to be new since the prior. CT ABDOMEN PELVIS FINDINGS Hepatobiliary: No focal liver lesion. Normal gallbladder, without biliary ductal dilatation. Pancreas: Normal, without mass or ductal dilatation. Spleen: Normal in size, without focal abnormality. Adrenals/Urinary Tract: Normal adrenal glands. Normal kidneys, without hydronephrosis. The bladder is decompressed. Apparent wall thickening may be secondary. Stomach/Bowel: The gastric antrum is underdistended and appears thick walled including at 1.5 cm on 59/2. Similar 1.6 cm on the prior. Colonic stool burden suggests constipation. Scattered colonic diverticula. Normal terminal ileum and appendix. Borderline prominent upper pelvic fluid-filled small bowel loops of up to 2.5 cm. No proximal small bowel distension. Vascular/Lymphatic: Aortic atherosclerosis. No abdominal adenopathy. A left external iliac node measures 11 mm on 101/2 is similar to on the prior exam (when remeasured). Decreased size of tiny left obturator nodes including up to 4 mm today versus 9 mm on the prior. Reproductive: Hysterectomy. Other: Omental thickening/nodularity again identified. Example  right abdomen at 9 mm on 74/2 versus 14 mm on the prior exam (when remeasured). Soft tissue fullness about the superior aspect of the left vaginal cuff is less well-defined today. Example at on the order of 3.3 x 2.4 cm on 97/2 versus 5.3 x 5.0 cm when remeasured in a similar fashion on the prior. Mild  ventral/periumbilical hernia or laxity contains fat and peritoneal tumor, similar on 77/2. Musculoskeletal: No acute osseous abnormality. IMPRESSION: 1. Response to therapy since outside CTs of 01/28/2023. Improvement in abdominopelvic nodal/peritoneal metastasis. Resolution of thoracic adenopathy. 2. Developing sclerotic lesions within the thoracic spine which given response to therapy elsewhere likely represent treated previously CT occult metastasis. 3. Areas of borderline to mild small bowel dilatation within the upper pelvis could represent ileus or low-grade partial obstruction, given peritoneal disease. 4.  Aortic Atherosclerosis (ICD10-I70.0). 5. Similar gastric antral thickening in the setting of underdistention. Suspicious for gastritis. Electronically Signed   By: Jeronimo Greaves M.D.   On: 04/08/2023 16:29      04/14/2023 Tumor Marker   Patient's tumor was tested for the following markers: CA-125. Results of the tumor marker test revealed 100.   05/02/2023 Tumor Marker   Patient's tumor was tested for the following markers: CA-125. Results of the tumor marker test revealed 82.2.     PHYSICAL EXAMINATION: ECOG PERFORMANCE STATUS: 1 - Symptomatic but completely ambulatory  Vitals:   05/30/23 0845  BP: (!) 147/82  Pulse: (!) 105  Resp: 18  Temp: 97.9 F (36.6 C)  SpO2: 98%   Filed Weights   05/30/23 0845  Weight: 178 lb 6.4 oz (80.9 kg)    GENERAL:alert, no distress and comfortable NEURO: alert & oriented x 3 with fluent speech, no focal motor/sensory deficits  LABORATORY DATA:  I have reviewed the data as listed    Component Value Date/Time   NA 141 05/30/2023 0812   NA 140  03/25/2023 1541   K 3.8 05/30/2023 0812   CL 107 05/30/2023 0812   CO2 25 05/30/2023 0812   GLUCOSE 162 (H) 05/30/2023 0812   GLUCOSE 93 09/10/2006 0834   BUN 20 05/30/2023 0812   BUN 12 03/25/2023 1541   CREATININE 0.92 05/30/2023 0812   CREATININE 0.64 07/13/2014 1621   CALCIUM 9.7 05/30/2023 0812   CALCIUM 9.7 03/29/2010 1554   PROT 7.6 05/30/2023 0812   PROT 7.6 11/09/2020 1156   ALBUMIN 4.3 05/30/2023 0812   ALBUMIN 4.4 11/09/2020 1156   AST 9 (L) 05/30/2023 0812   ALT 6 05/30/2023 0812   ALKPHOS 164 (H) 05/30/2023 0812   BILITOT 0.3 05/30/2023 0812   GFRNONAA >60 05/30/2023 0812   GFRNONAA >89 07/13/2014 1621   GFRAA 86 11/09/2020 1156   GFRAA >89 07/13/2014 1621    No results found for: "SPEP", "UPEP"  Lab Results  Component Value Date   WBC 6.5 05/30/2023   NEUTROABS 5.6 05/30/2023   HGB 8.7 (L) 05/30/2023   HCT 26.9 (L) 05/30/2023   MCV 87.1 05/30/2023   PLT 240 05/30/2023      Chemistry      Component Value Date/Time   NA 141 05/30/2023 0812   NA 140 03/25/2023 1541   K 3.8 05/30/2023 0812   CL 107 05/30/2023 0812   CO2 25 05/30/2023 0812   BUN 20 05/30/2023 0812   BUN 12 03/25/2023 1541   CREATININE 0.92 05/30/2023 0812   CREATININE 0.64 07/13/2014 1621      Component Value Date/Time   CALCIUM 9.7 05/30/2023 0812   CALCIUM 9.7 03/29/2010 1554   ALKPHOS 164 (H) 05/30/2023 0812   AST 9 (L) 05/30/2023 0812   ALT 6 05/30/2023 0812   BILITOT 0.3 05/30/2023 6644

## 2023-05-30 NOTE — Assessment & Plan Note (Signed)
She will continue to take Dilaudid as needed for few days after treatment

## 2023-05-30 NOTE — Progress Notes (Signed)
Pt reported to infusion requesting to switch cetrizine to benadryl today d/t not sleeping well last night. This RN reached out to Dr. [name]. This RN received V/O from Dr. [name] for IV benadryl 25 mg to be given in place of cetrizine today.

## 2023-05-30 NOTE — Assessment & Plan Note (Signed)
She is prone to get constipated I recommend schedule laxative for few days after treatment

## 2023-05-30 NOTE — Progress Notes (Signed)
Pt c/o new onset gas and stomach cramps during today's infusion. Pt informed this RN that she had rice and cabbage for lunch. Pt was provided ginger ale and this RN made Dr.Gorsuch aware. This RN received V/O from Dr.Gorsuch for 20 mg Pepcid PO to be given.   At 1520 Pt stated "my stomach is easing up, thank you".

## 2023-05-30 NOTE — Patient Instructions (Addendum)
[facility]  Discharge Instructions: Thank you for choosing [facility] to provide your oncology and hematology care.   If you have a lab appointment with the [facility], please go directly to the [facility] and check in at the registration area.   Wear comfortable clothing and clothing appropriate for easy access to any Portacath or PICC line.   We strive to give you quality time with your provider. You may need to reschedule your appointment if you arrive late (15 or more minutes).  Arriving late affects you and other patients whose appointments are after yours.  Also, if you miss three or more appointments without notifying the office, you may be dismissed from the clinic at the provider's discretion.      For prescription refill requests, have your pharmacy contact our office and allow 72 hours for refills to be completed.    Today you received the following chemotherapy and/or immunotherapy agents: Jemperli/Taxol/Carboplatin      To help prevent nausea and vomiting after your treatment, we encourage you to take your nausea medication as directed.  BELOW ARE SYMPTOMS THAT SHOULD BE REPORTED IMMEDIATELY: *FEVER GREATER THAN 100.4 F (38 C) OR HIGHER *CHILLS OR SWEATING *NAUSEA AND VOMITING THAT IS NOT CONTROLLED WITH YOUR NAUSEA MEDICATION *UNUSUAL SHORTNESS OF BREATH *UNUSUAL BRUISING OR BLEEDING *URINARY PROBLEMS (pain or burning when urinating, or frequent urination) *BOWEL PROBLEMS (unusual diarrhea, constipation, pain near the anus) TENDERNESS IN MOUTH AND THROAT WITH OR WITHOUT PRESENCE OF ULCERS (sore throat, sores in mouth, or a toothache) UNUSUAL RASH, SWELLING OR PAIN  UNUSUAL VAGINAL DISCHARGE OR ITCHING   Items with * indicate a potential emergency and should be followed up as soon as possible or go to the Emergency Department if any problems should occur.  Please show the CHEMOTHERAPY ALERT CARD or IMMUNOTHERAPY  ALERT CARD at check-in to the Emergency Department and triage nurse.  Should you have questions after your visit or need to cancel or reschedule your appointment, please contact [facility]  Dept: [phone]  and follow the prompts.  Office hours are 8:00 a.m. to 4:30 p.m. Monday - Friday. Please note that voicemails left after 4:00 p.m. may not be returned until the following business day.  We are closed weekends and major holidays. You have access to a nurse at all times for urgent questions. Please call the main number to the clinic Dept: [phone] and follow the prompts.   For any non-urgent questions, you may also contact your provider using MyChart. We now offer e-Visits for anyone 2 and older to request care online for non-urgent symptoms. For details visit [email or web address].   Also download the MyChart app! Go to the app store, search "MyChart", open the app, select [facility], and log in with your MyChart username and password.

## 2023-05-30 NOTE — Assessment & Plan Note (Signed)
She is stable, she is not symptomatic This is due to side effects of treatment As above, we will proceed with treatment

## 2023-05-31 ENCOUNTER — Other Ambulatory Visit: Payer: Self-pay

## 2023-06-01 ENCOUNTER — Other Ambulatory Visit: Payer: Self-pay

## 2023-06-04 ENCOUNTER — Telehealth: Payer: Self-pay | Admitting: *Deleted

## 2023-06-04 NOTE — Telephone Encounter (Signed)
Returned her call. She started having pain in upper part of her stomach this morning. She said it was like acid reflux. She denies constipation. She is taking Senna every day as instructed and had bm this morning.  She said last time she had pains in her abdomen, she took dilaudid and it helped.  She had not taken dilaudid or anything else for pain at time of call. She will take it now and contact office if pain does not improve.   Sherri Gill stated she has CT on 8/20 and confirmed appts on 8/23. She also asked if her last infusion on Friday was the 6th dose of chemo since April. Confirmed this information with patient.   FYI

## 2023-06-06 ENCOUNTER — Encounter: Payer: Self-pay | Admitting: Hematology and Oncology

## 2023-06-10 ENCOUNTER — Other Ambulatory Visit: Payer: Self-pay

## 2023-06-12 ENCOUNTER — Other Ambulatory Visit: Payer: Self-pay

## 2023-06-17 ENCOUNTER — Ambulatory Visit (HOSPITAL_COMMUNITY)
Admission: RE | Admit: 2023-06-17 | Discharge: 2023-06-17 | Disposition: A | Discharge status: Home / Self Care | Payer: Medicare HMO | Source: Ambulatory Visit | Attending: Hematology and Oncology | Admitting: Hematology and Oncology

## 2023-06-17 ENCOUNTER — Encounter (HOSPITAL_COMMUNITY): Payer: Self-pay

## 2023-06-17 ENCOUNTER — Encounter: Payer: Self-pay | Admitting: Hematology and Oncology

## 2023-06-17 DIAGNOSIS — C55 Malignant neoplasm of uterus, part unspecified: Secondary | ICD-10-CM | POA: Diagnosis not present

## 2023-06-17 DIAGNOSIS — I700 Atherosclerosis of aorta: Secondary | ICD-10-CM | POA: Diagnosis not present

## 2023-06-17 DIAGNOSIS — Z8542 Personal history of malignant neoplasm of other parts of uterus: Secondary | ICD-10-CM | POA: Diagnosis not present

## 2023-06-17 DIAGNOSIS — K5730 Diverticulosis of large intestine without perforation or abscess without bleeding: Secondary | ICD-10-CM | POA: Diagnosis not present

## 2023-06-17 DIAGNOSIS — N3289 Other specified disorders of bladder: Secondary | ICD-10-CM | POA: Diagnosis not present

## 2023-06-17 MED ORDER — SODIUM CHLORIDE (PF) 0.9 % IJ SOLN
INTRAMUSCULAR | Status: AC
  Filled 2023-06-17: qty 50

## 2023-06-17 MED ORDER — IOHEXOL 300 MG/ML  SOLN
100.0000 mL | Freq: Once | INTRAMUSCULAR | Status: AC | PRN
  Administered 2023-06-17: 100 mL via INTRAVENOUS

## 2023-06-18 ENCOUNTER — Other Ambulatory Visit: Payer: Self-pay | Admitting: Internal Medicine

## 2023-06-18 DIAGNOSIS — I4891 Unspecified atrial fibrillation: Secondary | ICD-10-CM

## 2023-06-20 ENCOUNTER — Other Ambulatory Visit: Payer: Medicare HMO

## 2023-06-20 ENCOUNTER — Other Ambulatory Visit: Payer: Self-pay

## 2023-06-20 ENCOUNTER — Inpatient Hospital Stay: Payer: Medicare HMO

## 2023-06-20 ENCOUNTER — Ambulatory Visit: Payer: Medicare HMO

## 2023-06-20 ENCOUNTER — Encounter: Payer: Self-pay | Admitting: Hematology and Oncology

## 2023-06-20 ENCOUNTER — Other Ambulatory Visit (HOSPITAL_COMMUNITY): Payer: Self-pay

## 2023-06-20 ENCOUNTER — Telehealth: Payer: Self-pay

## 2023-06-20 ENCOUNTER — Inpatient Hospital Stay (HOSPITAL_BASED_OUTPATIENT_CLINIC_OR_DEPARTMENT_OTHER): Payer: Medicare HMO | Admitting: Hematology and Oncology

## 2023-06-20 ENCOUNTER — Ambulatory Visit: Payer: Medicare HMO | Admitting: Hematology and Oncology

## 2023-06-20 VITALS — BP 151/57 | HR 72 | Temp 98.9°F | Resp 18 | Ht 63.0 in | Wt 184.6 lb

## 2023-06-20 VITALS — BP 145/71 | HR 71 | Resp 16

## 2023-06-20 DIAGNOSIS — C55 Malignant neoplasm of uterus, part unspecified: Secondary | ICD-10-CM | POA: Diagnosis not present

## 2023-06-20 DIAGNOSIS — R109 Unspecified abdominal pain: Secondary | ICD-10-CM | POA: Diagnosis not present

## 2023-06-20 DIAGNOSIS — K429 Umbilical hernia without obstruction or gangrene: Secondary | ICD-10-CM | POA: Diagnosis not present

## 2023-06-20 DIAGNOSIS — Z885 Allergy status to narcotic agent status: Secondary | ICD-10-CM | POA: Diagnosis not present

## 2023-06-20 DIAGNOSIS — Z880 Allergy status to penicillin: Secondary | ICD-10-CM | POA: Diagnosis not present

## 2023-06-20 DIAGNOSIS — M79605 Pain in left leg: Secondary | ICD-10-CM | POA: Diagnosis not present

## 2023-06-20 DIAGNOSIS — Z79899 Other long term (current) drug therapy: Secondary | ICD-10-CM | POA: Diagnosis not present

## 2023-06-20 DIAGNOSIS — R194 Change in bowel habit: Secondary | ICD-10-CM

## 2023-06-20 DIAGNOSIS — R188 Other ascites: Secondary | ICD-10-CM | POA: Diagnosis not present

## 2023-06-20 DIAGNOSIS — E538 Deficiency of other specified B group vitamins: Secondary | ICD-10-CM

## 2023-06-20 DIAGNOSIS — G893 Neoplasm related pain (acute) (chronic): Secondary | ICD-10-CM

## 2023-06-20 DIAGNOSIS — T451X5A Adverse effect of antineoplastic and immunosuppressive drugs, initial encounter: Secondary | ICD-10-CM | POA: Diagnosis not present

## 2023-06-20 DIAGNOSIS — G629 Polyneuropathy, unspecified: Secondary | ICD-10-CM | POA: Diagnosis not present

## 2023-06-20 DIAGNOSIS — D508 Other iron deficiency anemias: Secondary | ICD-10-CM

## 2023-06-20 DIAGNOSIS — K5909 Other constipation: Secondary | ICD-10-CM | POA: Diagnosis not present

## 2023-06-20 DIAGNOSIS — Z888 Allergy status to other drugs, medicaments and biological substances status: Secondary | ICD-10-CM | POA: Diagnosis not present

## 2023-06-20 DIAGNOSIS — M79604 Pain in right leg: Secondary | ICD-10-CM | POA: Diagnosis not present

## 2023-06-20 DIAGNOSIS — Z5111 Encounter for antineoplastic chemotherapy: Secondary | ICD-10-CM | POA: Diagnosis not present

## 2023-06-20 DIAGNOSIS — D6481 Anemia due to antineoplastic chemotherapy: Secondary | ICD-10-CM

## 2023-06-20 DIAGNOSIS — I517 Cardiomegaly: Secondary | ICD-10-CM | POA: Diagnosis not present

## 2023-06-20 DIAGNOSIS — I700 Atherosclerosis of aorta: Secondary | ICD-10-CM | POA: Diagnosis not present

## 2023-06-20 DIAGNOSIS — K5730 Diverticulosis of large intestine without perforation or abscess without bleeding: Secondary | ICD-10-CM | POA: Diagnosis not present

## 2023-06-20 DIAGNOSIS — Z7901 Long term (current) use of anticoagulants: Secondary | ICD-10-CM | POA: Diagnosis not present

## 2023-06-20 DIAGNOSIS — C786 Secondary malignant neoplasm of retroperitoneum and peritoneum: Secondary | ICD-10-CM | POA: Diagnosis not present

## 2023-06-20 DIAGNOSIS — Z5112 Encounter for antineoplastic immunotherapy: Secondary | ICD-10-CM | POA: Diagnosis not present

## 2023-06-20 LAB — CMP (CANCER CENTER ONLY)
ALT: 6 U/L (ref 0–44)
AST: 9 U/L — ABNORMAL LOW (ref 15–41)
Albumin: 3.9 g/dL (ref 3.5–5.0)
Alkaline Phosphatase: 139 U/L — ABNORMAL HIGH (ref 38–126)
Anion gap: 8 (ref 5–15)
BUN: 15 mg/dL (ref 8–23)
CO2: 26 mmol/L (ref 22–32)
Calcium: 9.2 mg/dL (ref 8.9–10.3)
Chloride: 105 mmol/L (ref 98–111)
Creatinine: 1.01 mg/dL — ABNORMAL HIGH (ref 0.44–1.00)
GFR, Estimated: 60 mL/min — ABNORMAL LOW (ref >60)
Glucose, Bld: 125 mg/dL — ABNORMAL HIGH (ref 70–99)
Potassium: 4 mmol/L (ref 3.5–5.1)
Sodium: 139 mmol/L (ref 135–145)
Total Bilirubin: 0.3 mg/dL (ref 0.3–1.2)
Total Protein: 6.9 g/dL (ref 6.5–8.1)

## 2023-06-20 LAB — CBC WITH DIFFERENTIAL (CANCER CENTER ONLY)
Abs Immature Granulocytes: 0.02 10*3/uL (ref 0.00–0.07)
Basophils Absolute: 0 10*3/uL (ref 0.0–0.1)
Basophils Relative: 0 %
Eosinophils Absolute: 0 10*3/uL (ref 0.0–0.5)
Eosinophils Relative: 0 %
HCT: 26.6 % — ABNORMAL LOW (ref 36.0–46.0)
Hemoglobin: 8.4 g/dL — ABNORMAL LOW (ref 12.0–15.0)
Immature Granulocytes: 0 %
Lymphocytes Relative: 25 %
Lymphs Abs: 1.5 10*3/uL (ref 0.7–4.0)
MCH: 28 pg (ref 26.0–34.0)
MCHC: 31.6 g/dL (ref 30.0–36.0)
MCV: 88.7 fL (ref 80.0–100.0)
Monocytes Absolute: 0.7 10*3/uL (ref 0.1–1.0)
Monocytes Relative: 12 %
Neutro Abs: 3.7 10*3/uL (ref 1.7–7.7)
Neutrophils Relative %: 63 %
Platelet Count: 250 10*3/uL (ref 150–400)
RBC: 3 MIL/uL — ABNORMAL LOW (ref 3.87–5.11)
RDW: 17.8 % — ABNORMAL HIGH (ref 11.5–15.5)
WBC Count: 6 10*3/uL (ref 4.0–10.5)
nRBC: 0 % (ref 0.0–0.2)

## 2023-06-20 LAB — SAMPLE TO BLOOD BANK

## 2023-06-20 LAB — TSH: TSH: 5.785 u[IU]/mL — ABNORMAL HIGH (ref 0.350–4.500)

## 2023-06-20 MED ORDER — SODIUM CHLORIDE 0.9% FLUSH
10.0000 mL | INTRAVENOUS | Status: DC | PRN
  Administered 2023-06-20: 10 mL

## 2023-06-20 MED ORDER — LEVOTHYROXINE SODIUM 50 MCG PO TABS
50.0000 ug | ORAL_TABLET | Freq: Every day | ORAL | 1 refills | Status: DC
  Filled 2023-06-20: qty 30, 30d supply, fill #0

## 2023-06-20 MED ORDER — HYDROMORPHONE HCL 2 MG PO TABS
2.0000 mg | ORAL_TABLET | Freq: Three times a day (TID) | ORAL | 0 refills | Status: DC | PRN
  Filled 2023-06-20: qty 30, 10d supply, fill #0

## 2023-06-20 MED ORDER — CYANOCOBALAMIN 1000 MCG/ML IJ SOLN: 1000.0000 ug | Freq: Once | INTRAMUSCULAR | Status: DC

## 2023-06-20 MED ORDER — CYANOCOBALAMIN 1000 MCG/ML IJ SOLN
1000.0000 ug | Freq: Once | INTRAMUSCULAR | Status: AC
  Administered 2023-06-20: 1000 ug via INTRAMUSCULAR
  Filled 2023-06-20: qty 1

## 2023-06-20 MED ORDER — SODIUM CHLORIDE 0.9% FLUSH
10.0000 mL | Freq: Once | INTRAVENOUS | Status: AC
  Administered 2023-06-20: 10 mL

## 2023-06-20 MED ORDER — SODIUM CHLORIDE 0.9 % IV SOLN
1000.0000 mg | Freq: Once | INTRAVENOUS | Status: AC
  Administered 2023-06-20: 1000 mg via INTRAVENOUS
  Filled 2023-06-20: qty 20

## 2023-06-20 MED ORDER — OXYCODONE HCL 5 MG PO TABS
10.0000 mg | ORAL_TABLET | Freq: Once | ORAL | Status: AC
  Administered 2023-06-20: 10 mg via ORAL
  Filled 2023-06-20: qty 2

## 2023-06-20 MED ORDER — SODIUM CHLORIDE 0.9 % IV SOLN: Freq: Once | INTRAVENOUS | Status: AC

## 2023-06-20 MED ORDER — HEPARIN SOD (PORK) LOCK FLUSH 100 UNIT/ML IV SOLN
500.0000 [IU] | Freq: Once | INTRAVENOUS | Status: AC | PRN
  Administered 2023-06-20: 500 [IU]

## 2023-06-20 NOTE — Telephone Encounter (Signed)
-----   Message from Artis Delay sent at 06/20/2023  1:52 PM EDT ----- TSH is high Please call in 50 mcg synthroid

## 2023-06-20 NOTE — Telephone Encounter (Signed)
Attempted to call. No voicemail set up.  Will try to call back.

## 2023-06-20 NOTE — Progress Notes (Signed)
Pt expressed c/o abdominal pain and cramping 8/10, Pt requested something to help with pain. Pt informed this RN that she had cabbage for dinner last night and believes it is from that. This RN made Dr. [name] aware. Dr. [name] recommended Pt to stop eating cabbage. This RN made Pt aware and Pt verbalized being upset regarding no intervention being provided. Pt stated "this is not fair, how does she know this is not from the cancer, can she give me something for pain?" This RN made Dr. [name] aware. This RN received V/O from Dr. [name] for 10 mg PO oxycodone x 1, this RN made Pt aware, and Pt expressed gratitude for the medication.

## 2023-06-20 NOTE — Assessment & Plan Note (Signed)
She has intermittent abdominal pain She has good success taking Dilaudid We will continue the same

## 2023-06-20 NOTE — Assessment & Plan Note (Signed)
I have reviewed her CT imaging which show excellent response to therapy Will continue immunotherapy Her next imaging will be due in November

## 2023-06-20 NOTE — Patient Instructions (Signed)
 Vitamin B12 Injection What is this medication? Vitamin B12 (VAHY tuh min B12) prevents and treats low vitamin B12 levels in your body. It is used in people who do not get enough vitamin B12 from their diet or when their digestive tract does not absorb enough. Vitamin B12 plays an important role in maintaining the health of your nervous system and red blood cells. This medicine may be used for other purposes; ask your health care provider or pharmacist if you have questions. COMMON BRAND NAME(S): B-12 Compliance Kit, B-12 Injection Kit, Cyomin, Dodex, LA-12, Nutri-Twelve, Physicians EZ Use B-12, Primabalt, Vitamin Deficiency Injectable System - B12 What should I tell my care team before I take this medication? They need to know if you have any of these conditions: Kidney disease Leber's disease Megaloblastic anemia An unusual or allergic reaction to cyanocobalamin, cobalt, other medications, foods, dyes, or preservatives Pregnant or trying to get pregnant Breast-feeding How should I use this medication? This medication is injected into a muscle or deeply under the skin. It is usually given in a clinic or care team's office. However, your care team may teach you how to inject yourself. Follow all instructions. Talk to your care team about the use of this medication in children. Special care may be needed. Overdosage: If you think you have taken too much of this medicine contact a poison control center or emergency room at once. NOTE: This medicine is only for you. Do not share this medicine with others. What if I miss a dose? If you are given your dose at a clinic or care team's office, call to reschedule your appointment. If you give your own injections, and you miss a dose, take it as soon as you can. If it is almost time for your next dose, take only that dose. Do not take double or extra doses. What may interact with this medication? Alcohol Colchicine This list may not describe all possible  interactions. Give your health care provider a list of all the medicines, herbs, non-prescription drugs, or dietary supplements you use. Also tell them if you smoke, drink alcohol, or use illegal drugs. Some items may interact with your medicine. What should I watch for while using this medication? Visit your care team regularly. You may need blood work done while you are taking this medication. You may need to follow a special diet. Talk to your care team. Limit your alcohol intake and avoid smoking to get the best benefit. What side effects may I notice from receiving this medication? Side effects that you should report to your care team as soon as possible: Allergic reactions--skin rash, itching, hives, swelling of the face, lips, tongue, or throat Swelling of the ankles, hands, or feet Trouble breathing Side effects that usually do not require medical attention (report to your care team if they continue or are bothersome): Diarrhea This list may not describe all possible side effects. Call your doctor for medical advice about side effects. You may report side effects to FDA at 1-800-FDA-1088. Where should I keep my medication? Keep out of the reach of children. Store at room temperature between 15 and 30 degrees C (59 and 85 degrees F). Protect from light. Throw away any unused medication after the expiration date. NOTE: This sheet is a summary. It may not cover all possible information. If you have questions about this medicine, talk to your doctor, pharmacist, or health care provider.  2024 Elsevier/Gold Standard (2021-06-26 00:00:00)

## 2023-06-20 NOTE — Assessment & Plan Note (Signed)
She was found to have vitamin B12 deficiency and is prescribed vitamin B12 replacement therapy every visit I plan to recheck it again at the end of the year

## 2023-06-20 NOTE — Telephone Encounter (Signed)
Called and given below message from Dr. [name]. She verbalized understanding and Rx sent to her preferred pharmacy.

## 2023-06-20 NOTE — Assessment & Plan Note (Signed)
 She is stable, she is not symptomatic This is due to side effects of treatment As above, we will proceed with treatment

## 2023-06-20 NOTE — Progress Notes (Signed)
[facility] OFFICE PROGRESS NOTE  Patient Care Team: [name], MD as PCP - General (Internal Medicine) Rennis Golden Lisette Abu, MD as PCP - Cardiology (Cardiology)  ASSESSMENT & PLAN:  Uterine cancer [facility]) I have reviewed her CT imaging which show excellent response to therapy Will continue immunotherapy Her next imaging will be due in November  Anemia due to antineoplastic chemotherapy She is stable, she is not symptomatic This is due to side effects of treatment As above, we will proceed with treatment  Cancer associated pain She has intermittent abdominal pain She has good success taking Dilaudid We will continue the same  Vitamin B12 deficiency She was found to have vitamin B12 deficiency and is prescribed vitamin B12 replacement therapy every visit I plan to recheck it again at the end of the year  Bowel habit changes She has recent bowel habit changes which she attributed to eating cabbages She will continue to take laxatives as needed   No orders of the defined types were placed in this encounter.   All questions were answered. The patient knows to call the clinic with any problems, questions or concerns. The total time spent in the appointment was 40 minutes encounter with patients including review of chart and various tests results, discussions about plan of care and coordination of care plan   Artis Delay, MD 06/20/2023 12:39 PM  INTERVAL HISTORY: Please see below for problem oriented charting. she returns for treatment follow-up She has intermittent abdominal bloating and discomfort Denies recent nausea vomiting We discussed recent test results I reviewed her CT imaging She still have some mild peripheral neuropathy from prior She is getting B12 injections  REVIEW OF SYSTEMS:   Constitutional: Denies fevers, chills or abnormal weight loss Eyes: Denies blurriness of vision Ears, nose, mouth, throat, and face: Denies mucositis or sore  throat Respiratory: Denies cough, dyspnea or wheezes Cardiovascular: Denies palpitation, chest discomfort or lower extremity swelling Skin: Denies abnormal skin rashes Lymphatics: Denies new lymphadenopathy or easy bruising Behavioral/Psych: Mood is stable, no new changes  All other systems were reviewed with the patient and are negative.  I have reviewed the past medical history, past surgical history, social history and family history with the patient and they are unchanged from previous note.  ALLERGIES:  is allergic to codeine sulfate, penicillins, percocet [oxycodone-acetaminophen], and zestril [lisinopril].  MEDICATIONS:  Current Outpatient Medications  Medication Sig Dispense Refill   calcium carbonate (TUMS - DOSED IN MG ELEMENTAL CALCIUM) 500 MG chewable tablet Chew 1 tablet by mouth 2 (two) times daily.     acetaminophen (TYLENOL) 500 MG tablet Take 1,000 mg by mouth every 6 (six) hours as needed for mild pain.     albuterol (VENTOLIN HFA) 108 (90 Base) MCG/ACT inhaler Inhale 2 puffs into the lungs every 4 (four) hours as needed for wheezing or shortness of breath. 8 g 0   atorvastatin (LIPITOR) 20 MG tablet Take 1 tablet (20 mg total) by mouth at bedtime. 30 tablet 11   B Complex-C (B-COMPLEX WITH VITAMIN C) tablet Take 1 tablet by mouth in the morning.     Blood Glucose Monitoring Suppl (ONE TOUCH ULTRA MINI) w/Device KIT Please use as directed. 1 each 0   Cholecalciferol 1000 units capsule Take 1 capsule (1,000 Units total) by mouth daily. 90 capsule 1   diltiazem (CARDIZEM CD) 120 MG 24 hr capsule Take 1 capsule (120 mg total) by mouth daily. 30 capsule 11   dorzolamide-timolol (COSOPT) 22.3-6.8 MG/ML ophthalmic  solution Place 1 drop into both eyes 2 (two) times daily.     glucose blood (ONE TOUCH ULTRA TEST) test strip Use as instructed 100 each 1   hydrALAZINE (APRESOLINE) 25 MG tablet TAKE 1 TABLET BY MOUTH THREE TIMES DAILY 90 tablet 3   HYDROmorphone (DILAUDID) 2 MG  tablet Take 1 tablet (2 mg) by mouth 3 times daily as needed for severe pain. 30 tablet 0   Insulin Pen Needle 32G X 4 MM MISC 1 each by Does not apply route daily. 100 each 3   latanoprost (XALATAN) 0.005 % ophthalmic solution Place 1 drop into both eyes at bedtime.     lidocaine-prilocaine (EMLA) cream Apply to affected area once as directed 30 g 3   losartan (COZAAR) 100 MG tablet Take 1 tablet (100 mg total) by mouth at bedtime. 90 tablet 3   magnesium oxide (MAG-OX) 400 (240 Mg) MG tablet Take 1 tablet (400 mg total) by mouth 2 (two) times daily. 60 tablet 1   metFORMIN (GLUCOPHAGE) 1000 MG tablet Take 1 tablet (1,000 mg total) by mouth 2 (two) times daily. 180 tablet 3   ondansetron (ZOFRAN) 8 MG tablet Take 1 tablet (8 mg total) by mouth every 8 (eight) hours as needed for nausea or vomiting. Start on the third day after chemotherapy. 30 tablet 1   ONETOUCH DELICA LANCETS 33G MISC Please use as directed. 100 each 0   pantoprazole (PROTONIX) 40 MG tablet Take 1 tablet by mouth daily. 360 tablet 0   polyethylene glycol powder (GLYCOLAX/MIRALAX) 17 GM/SCOOP powder Take 17 g by mouth daily. 238 g 1   prochlorperazine (COMPAZINE) 10 MG tablet Take 1 tablet (10 mg total) by mouth every 6 (six) hours as needed for nausea or vomiting. 30 tablet 1   rivaroxaban (XARELTO) 20 MG TABS tablet Take 1 tablet (20 mg total) by mouth daily with supper. 90 tablet 1   No current facility-administered medications for this visit.   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 Heterogeneous endometrial thickening with appearance of moderate complex fluid in the endometrial canal, possible hemorrhagic material. Endometrial thickness is considered abnormal for an asymptomatic post-menopausal female. Endometrial sampling should be considered to exclude carcinoma. 2. Nonvisualized ovary 3. Heterogeneous thick-walled urinary bladder, correlate for cystitis.   12/28/2021 Pathology Results   CYTOLOGY - NON PAP  CASE: [id number]  PATIENT: Sherri Gill Date  Non-Gynecological Cytology Report   Clinical History: Abnormal pelvic US, highly suspicious of malignant ascites  Specimen Submitted:  A. ASCITES, PARACENTESIS:    FINAL MICROSCOPIC DIAGNOSIS:  - Malignant cells consistent with adenocarcinoma  - See comment   SPECIMEN ADEQUACY:  Satisfactory for evaluation   DIAGNOSTIC COMMENTS:  Immunohistochemical stains show that the tumor cells are positive for CK7 and PAX8 while they are negative for CK20, CDX2 and ER, consistent with above interpretation.  Additionally, the tumor cells are diffusely positive for p53 (clonal overexpression), suggestive of a high-grade serous carcinoma.    12/28/2021  Imaging   US pelvis 1. Persistent abnormal appearance of the endometrium, heterogeneously thickened and complex with possible intraluminal fluid. This is similar to that seen on December 2022 ultrasound. Endometrial thickness is considered abnormal for an asymptomatic post-menopausal female. Endometrial sampling should be considered to exclude carcinoma. 2. Nonvisualization of the ovaries.   12/28/2021 Imaging   1. Large volume ascites. 2. Small umbilical hernia containing fat and ascites. 3. Haziness of anterior omentum most likely related to ascites, other etiologies such as metastatic disease can not be excluded. 4. Diffuse colonic diverticulosis without evidence for diverticulitis.   12/29/2021 Procedure   Successful ultrasound-guided  paracentesis yielding 2.8 liters of peritoneal fluid.   01/14/2022 Initial Diagnosis   Uterine cancer (HCC)   01/14/2022 Cancer Staging   Staging form: Corpus Uteri - Carcinoma and Carcinosarcoma, AJCC 8th Edition - Clinical: Stage III (cT3, cN0, cM0) - Signed by Artis Delay, MD on 01/14/2022 Stage prefix: Initial diagnosis   01/15/2022 Surgery   Surgery: Cervical dilation under ultrasound guidance, hysteroscopy, endometrial sampling using the Myosure   Surgeons:  Carin Hock MD    Pathology: endometrial curetteings   Operative findings: On EUA, 8 cm mobile uterus, some nodularity along cul de sac. Cervix normal in appearance without discernible external os. Difficulty noted in dilating the cervix after using scalpel to incise in area of what what thought to be the external os. Under ultrasound guidance, cervix was dilated with confirmation on imaging of intra-uterine placement of dilators. On hysteroscopy, abnormal appearing endocervix and very calcified tissue obscuring good visualization of endometrium concerning for replacement by tumor.    01/15/2022 Pathology Results   FINAL MICROSCOPIC DIAGNOSIS:   A.   ENDOMETRIUM, CURETTAGE:  -    Serous endometrial carcinoma.   COMMENT:   The carcinoma is strongly and diffusely positive for p53 and diffuse p16 positivity.  The ER shows relatively diffuse positivity.  Overall, the histologic features (slit-like glandular spaces and psammoma bodies) and the immunohistochemical phenotype are most characteristic for a serous carcinoma.    01/21/2022 Procedure   Placement of single lumen port a cath via right internal jugular vein. The catheter tip lies at the cavo-atrial junction. A power injectable port a cath was placed and is ready for immediate use.     01/25/2022 - 06/21/2022 Chemotherapy   Patient is on Treatment Plan : UTERINE Carboplatin AUC 6 / Paclitaxel q21d     03/21/2022 Imaging   1. Near complete resolution of previously noted ascites,  with only small volume residual perihepatic ascites. 2. Improved omental caking and stranding in the ventral abdomen. 3. Findings are consistent with treatment response of peritoneal metastatic disease. 4. Thickening of the urinary bladder wall , consistent with nonspecific infectious or inflammatory cystitis. Correlate with urinalysis.   Aortic Atherosclerosis (ICD10-I70.0).       03/22/2022 Tumor Marker   Patient's tumor was tested for the following markers: CA-125. Results of the tumor marker test revealed 21.5.   04/09/2022 Surgery     Surgery: Diagnostic laparoscopy, conversion to exploratory laparotomy, partial omentectomy, lysis of adhesions for approximately 60 minutes, total abdominal hysterectomy, bilateral salpingo-oophorectomy, excision of ileal tumor implant and excision of transverse colon epiploica tumor implant   04/09/2022 Pathology Results   A.   OMENTUM, RESECTION:  -    Metastatic serous carcinoma.   B.   ILEAL TUMOR IMPLANTS, EXCISION:  -    Adhesions with small focus of metastatic serous carcinoma, possibly  intralymphatic.   C.   UTERUS, CERVIX,  BILATERAL FALLOPIAN TUBES AND OVARIES:  -    Serous carcinoma, endometrial, invasive into outer half of uterus,  with involvement of  cervical stroma, uterine serosa, ovaries and fallopian tubes.   -    Negative for cervical dysplasia.  -    Leiomyomas, largest 1.2 cm in greatest dimension.   D.   COLON, MESENTARY NODULE, TRANSVERSE, EXCISION:  -    Metastatic serous carcinoma   05/09/2022 Tumor Marker   Patient's tumor was tested for the following markers: CA-125. Results of the tumor marker test revealed 12.1.   05/29/2022 Tumor Marker   Patient's tumor was tested for the following markers: CA-125. Results of the tumor marker test revealed 9.1.   07/23/2022 Imaging   1. No abdominopelvic lymphadenopathy.  2. Scattered foci of fat stranding in the anterior pelvic peritoneal fat without discrete solid implants.  Mild smooth wall thickening of the pelvic peritoneum. These findings are nonspecific and could represent postsurgical/post treatment change, although residual peritoneal carcinomatosis is on the differential and close follow-up CT is suggested. 3. Moderate left colonic diverticulosis. 4. Aortic Atherosclerosis (ICD10-I70.0).   07/23/2022 Tumor Marker   Patient's tumor was tested for the following markers: CA-125. Results of the tumor marker test revealed 5.2.   09/09/2022 Tumor Marker   Patient's tumor was tested for the following markers: CA-125. Results of the tumor marker test revealed 10.1.   01/28/2023 Imaging   OMENTAL AND PERITONEAL CARCINOMATOSIS.   POTENTIAL SOURCES ARE GASTRIC (MID AND DISTAL GASTRIC WALL THICKENING) OR GENITOURINARY (SOFT TISSUE MASS EXTENDS CEPHALAD FROM THE LEFT CERVICAL CUFF TOWARDS THE LEFT ADNEXA).   IF GASTRIC, THE PELVIC MASS WOULD REPRESENT AN OMENTAL DEPOSIT. PELVIC MASS OBSTRUCTING LEFT KIDNEY, MILD HYDROURETERONEPHROSIS.   MEDIASTINAL ADENOPATHY, LIKELY NODAL DISEASE.    02/03/2023 Tumor Marker   Patient's tumor was tested for the following markers: CA-125. Results of the tumor marker test revealed 429.   02/07/2023 -  Chemotherapy   Patient is on Treatment Plan : UTERINE ENDOMETRIAL Dostarlimab-gxly (500 mg) + Carboplatin (AUC 5) + Paclitaxel (175 mg/m2) q21d x 6 cycles / Dostarlimab-gxly (1000 mg) q42d x 6 cycles      03/03/2023 Tumor Marker   Patient's tumor was tested for the following markers: CA-125. Results of the tumor marker test revealed 176.   04/09/2023 Imaging   CT CHEST ABDOMEN PELVIS W CONTRAST  Result Date: 04/08/2023 CLINICAL DATA:  Endometrial cancer/uterine cancer. Evaluate treatment response to chemotherapy. * Tracking Code: BO * EXAM: CT CHEST, ABDOMEN, AND PELVIS WITH CONTRAST TECHNIQUE: Multidetector CT imaging of the chest, abdomen and pelvis was performed following the standard protocol during bolus administration of  intravenous contrast. RADIATION DOSE REDUCTION: This exam was performed according to the departmental dose-optimization program which includes automated exposure control, adjustment of the mA and/or kV according to patient size and/or use of iterative reconstruction technique. CONTRAST:  OMNIPAQUE IOHEXOL 300 MG/ML  SOLN COMPARISON:  Outside CTs of 01/28/2023. Report available in the Care everywhere epic system. FINDINGS: CT CHEST FINDINGS Cardiovascular: Right Port-A-Cath tip mid right atrium. Bovine arch. Aortic atherosclerosis. Mild cardiomegaly, without pericardial effusion. No central pulmonary embolism, on this non-dedicated study. Mediastinum/Nodes: No supraclavicular adenopathy. Right paratracheal node measures 7 mm and 16/2 versus 11 mm on the prior exam (when remeasured). No hilar adenopathy. Resolution of prevascular adenopathy with an index node measuring 5 mm on 18/2 versus up to 13 mm on the prior exam (when remeasured). Lungs/Pleura: No pleural fluid.  Clear lungs. Musculoskeletal: Although lack of sagittal  reformats on the prior exam makes direct comparison challenging, there are sclerotic lesions within the T8 and T2 vertebral bodies on sagittal image 99 which are felt to be new since the prior. CT ABDOMEN PELVIS FINDINGS Hepatobiliary: No focal liver lesion. Normal gallbladder, without biliary ductal dilatation. Pancreas: Normal, without mass or ductal dilatation. Spleen: Normal in size, without focal abnormality. Adrenals/Urinary Tract: Normal adrenal glands. Normal kidneys, without hydronephrosis. The bladder is decompressed. Apparent wall thickening may be secondary. Stomach/Bowel: The gastric antrum is underdistended and appears thick walled including at 1.5 cm on 59/2. Similar 1.6 cm on the prior. Colonic stool burden suggests constipation. Scattered colonic diverticula. Normal terminal ileum and appendix. Borderline prominent upper pelvic fluid-filled small bowel loops of up to 2.5 cm.  No proximal small bowel distension. Vascular/Lymphatic: Aortic atherosclerosis. No abdominal adenopathy. A left external iliac node measures 11 mm on 101/2 is similar to on the prior exam (when remeasured). Decreased size of tiny left obturator nodes including up to 4 mm today versus 9 mm on the prior. Reproductive: Hysterectomy. Other: Omental thickening/nodularity again identified. Example right abdomen at 9 mm on 74/2 versus 14 mm on the prior exam (when remeasured). Soft tissue fullness about the superior aspect of the left vaginal cuff is less well-defined today. Example at on the order of 3.3 x 2.4 cm on 97/2 versus 5.3 x 5.0 cm when remeasured in a similar fashion on the prior. Mild ventral/periumbilical hernia or laxity contains fat and peritoneal tumor, similar on 77/2. Musculoskeletal: No acute osseous abnormality. IMPRESSION: 1. Response to therapy since outside CTs of 01/28/2023. Improvement in abdominopelvic nodal/peritoneal metastasis. Resolution of thoracic adenopathy. 2. Developing sclerotic lesions within the thoracic spine which given response to therapy elsewhere likely represent treated previously CT occult metastasis. 3. Areas of borderline to mild small bowel dilatation within the upper pelvis could represent ileus or low-grade partial obstruction, given peritoneal disease. 4.  Aortic Atherosclerosis (ICD10-I70.0). 5. Similar gastric antral thickening in the setting of underdistention. Suspicious for gastritis. Electronically Signed   By: Jeronimo Greaves M.D.   On: 04/08/2023 16:29      04/14/2023 Tumor Marker   Patient's tumor was tested for the following markers: CA-125. Results of the tumor marker test revealed 100.   05/02/2023 Tumor Marker   Patient's tumor was tested for the following markers: CA-125. Results of the tumor marker test revealed 82.2.   06/02/2023 Tumor Marker   Patient's tumor was tested for the following markers: CA-125. Results of the tumor marker test revealed  166.   06/17/2023 Imaging   CT CHEST ABDOMEN PELVIS W CONTRAST  Result Date: 06/17/2023 CLINICAL DATA:  History of endometrial cancer/uterine cancer. Evaluate treatment response to chemotherapy. * Tracking Code: BO *. EXAM: CT CHEST, ABDOMEN, AND PELVIS WITH CONTRAST TECHNIQUE: Multidetector CT imaging of the chest, abdomen and pelvis was performed following the standard protocol during bolus administration of intravenous contrast. RADIATION DOSE REDUCTION: This exam was performed according to the departmental dose-optimization program which includes automated exposure control, adjustment of the mA and/or kV according to patient size and/or use of iterative reconstruction technique. CONTRAST:  OMNIPAQUE IOHEXOL 300 MG/ML  SOLN COMPARISON:  Multiple priors including most recent CT April 08, 2023 FINDINGS: CT CHEST FINDINGS Cardiovascular: Right chest Port-A-Cath with tip in the right atrium. Aortic atherosclerosis. No central pulmonary embolus on this nondedicated study. Normal size heart. No significant pericardial effusion/thickening. Mediastinum/Nodes: No suspicious thyroid nodule. No pathologically enlarged mediastinal, hilar or axillary lymph nodes. Right paratracheal lymph  node now measures 5 mm in short axis on image 17/2 previously 7 mm prevascular lymph node now measures 3 mm in short axis on image 20/2 previously 5 mm the esophagus is grossly unremarkable. Lungs/Pleura: No suspicious pulmonary nodules or masses. No pleural effusion. No pneumothorax. Musculoskeletal: Similar appearance of the sclerotic lesions in the T2 and T8 vertebral bodies. No new aggressive lytic or blastic lesion of bone. CT ABDOMEN PELVIS FINDINGS Hepatobiliary: No suspicious hepatic lesion. Gallbladder is unremarkable. No biliary ductal dilation. Pancreas: No pancreatic ductal dilation or evidence of acute inflammation Spleen: No splenomegaly. Adrenals/Urinary Tract: Bilateral adrenal glands appear normal. No hydronephrosis.  Kidneys demonstrate symmetric enhancement. Urinary bladder is minimally distended limiting evaluation Stomach/Bowel: No radiopaque enteric contrast material was administered. Similar asymmetric thickening of the anterior gastric antrum measuring 15 mm on image 64/2, unchanged. No pathologic dilation of small or large bowel. Colonic diverticulosis without findings of acute diverticulitis. Vascular/Lymphatic: Aortic atherosclerosis. Normal caliber abdominal aorta. Smooth IVC contours. The portal, splenic and superior mesenteric veins are patent. Left external iliac lymph node measures 9 mm in short axis on image 102/2 previously 11 mm. -left obturator lymph node measures 2 mm in short axis on image 99/2 previously 4 mm. Reproductive: Uterus is surgically absent. Nodular soft tissue along the left side of the vaginal cuff now measures 2.1 x 1.8 cm on image 98/2 previously 3.3 x 2.4 cm. Other: Peritoneal/omental thickening and nodularity is decreased from prior. For instance in the central abdomen subjacent to the umbilicus nodular focus measures 10 mm on image 79/2 previously 2.2 cm. No significant abdominopelvic free fluid. Musculoskeletal: No new suspicious lytic or blastic lesion of bone. Multilevel degenerative changes spine. IMPRESSION: 1. Findings consistent with treatment response including decreased size of the nodular soft tissue along the left side of the vaginal cuff, decreased size of the peritoneal/omental thickening and nodularity, and decreased size of the mediastinal, left external iliac and left obturator lymph nodes. 2. Similar appearance of the sclerotic lesions in the T2 and T8 vertebral bodies. No new suspicious osseous lesions. 3. Similar asymmetric thickening of the anterior gastric antrum measuring 15 mm, unchanged. 4.  Aortic Atherosclerosis (ICD10-I70.0). Electronically Signed   By: Maudry Mayhew M.D.   On: 06/17/2023 14:46        PHYSICAL EXAMINATION: ECOG PERFORMANCE STATUS: 1 -  Symptomatic but completely ambulatory  Vitals:   06/20/23 0857  BP: (!) 151/57  Pulse: 72  Resp: 18  Temp: 98.9 F (37.2 C)  SpO2: 99%   Filed Weights   06/20/23 0857  Weight: 184 lb 9.6 oz (83.7 kg)    GENERAL:alert, no distress and comfortable  NEURO: alert & oriented x 3 with fluent speech, no focal motor/sensory deficits  LABORATORY DATA:  I have reviewed the data as listed    Component Value Date/Time   NA 139 06/20/2023 0844   NA 140 03/25/2023 1541   K 4.0 06/20/2023 0844   CL 105 06/20/2023 0844   CO2 26 06/20/2023 0844   GLUCOSE 125 (H) 06/20/2023 0844   GLUCOSE 93 09/10/2006 0834   BUN 15 06/20/2023 0844   BUN 12 03/25/2023 1541   CREATININE 1.01 (H) 06/20/2023 0844   CREATININE 0.64 07/13/2014 1621   CALCIUM 9.2 06/20/2023 0844   CALCIUM 9.7 03/29/2010 1554   PROT 6.9 06/20/2023 0844   PROT 7.6 11/09/2020 1156   ALBUMIN 3.9 06/20/2023 0844   ALBUMIN 4.4 11/09/2020 1156   AST 9 (L) 06/20/2023 0844   ALT 6 06/20/2023  0844   ALKPHOS 139 (H) 06/20/2023 0844   BILITOT 0.3 06/20/2023 0844   GFRNONAA 60 (L) 06/20/2023 0844   GFRNONAA >89 07/13/2014 1621   GFRAA 86 11/09/2020 1156   GFRAA >89 07/13/2014 1621    No results found for: "SPEP", "UPEP"  Lab Results  Component Value Date   WBC 6.0 06/20/2023   NEUTROABS 3.7 06/20/2023   HGB 8.4 (L) 06/20/2023   HCT 26.6 (L) 06/20/2023   MCV 88.7 06/20/2023   PLT 250 06/20/2023      Chemistry      Component Value Date/Time   NA 139 06/20/2023 0844   NA 140 03/25/2023 1541   K 4.0 06/20/2023 0844   CL 105 06/20/2023 0844   CO2 26 06/20/2023 0844   BUN 15 06/20/2023 0844   BUN 12 03/25/2023 1541   CREATININE 1.01 (H) 06/20/2023 0844   CREATININE 0.64 07/13/2014 1621      Component Value Date/Time   CALCIUM 9.2 06/20/2023 0844   CALCIUM 9.7 03/29/2010 1554   ALKPHOS 139 (H) 06/20/2023 0844   AST 9 (L) 06/20/2023 0844   ALT 6 06/20/2023 0844   BILITOT 0.3 06/20/2023 0844        RADIOGRAPHIC STUDIES: I have personally reviewed the radiological images as listed and agreed with the findings in the report. CT CHEST ABDOMEN PELVIS W CONTRAST  Result Date: 06/17/2023 CLINICAL DATA:  History of endometrial cancer/uterine cancer. Evaluate treatment response to chemotherapy. * Tracking Code: BO *. EXAM: CT CHEST, ABDOMEN, AND PELVIS WITH CONTRAST TECHNIQUE: Multidetector CT imaging of the chest, abdomen and pelvis was performed following the standard protocol during bolus administration of intravenous contrast. RADIATION DOSE REDUCTION: This exam was performed according to the departmental dose-optimization program which includes automated exposure control, adjustment of the mA and/or kV according to patient size and/or use of iterative reconstruction technique. CONTRAST:  OMNIPAQUE IOHEXOL 300 MG/ML  SOLN COMPARISON:  Multiple priors including most recent CT April 08, 2023 FINDINGS: CT CHEST FINDINGS Cardiovascular: Right chest Port-A-Cath with tip in the right atrium. Aortic atherosclerosis. No central pulmonary embolus on this nondedicated study. Normal size heart. No significant pericardial effusion/thickening. Mediastinum/Nodes: No suspicious thyroid nodule. No pathologically enlarged mediastinal, hilar or axillary lymph nodes. Right paratracheal lymph node now measures 5 mm in short axis on image 17/2 previously 7 mm prevascular lymph node now measures 3 mm in short axis on image 20/2 previously 5 mm the esophagus is grossly unremarkable. Lungs/Pleura: No suspicious pulmonary nodules or masses. No pleural effusion. No pneumothorax. Musculoskeletal: Similar appearance of the sclerotic lesions in the T2 and T8 vertebral bodies. No new aggressive lytic or blastic lesion of bone. CT ABDOMEN PELVIS FINDINGS Hepatobiliary: No suspicious hepatic lesion. Gallbladder is unremarkable. No biliary ductal dilation. Pancreas: No pancreatic ductal dilation or evidence of acute inflammation  Spleen: No splenomegaly. Adrenals/Urinary Tract: Bilateral adrenal glands appear normal. No hydronephrosis. Kidneys demonstrate symmetric enhancement. Urinary bladder is minimally distended limiting evaluation Stomach/Bowel: No radiopaque enteric contrast material was administered. Similar asymmetric thickening of the anterior gastric antrum measuring 15 mm on image 64/2, unchanged. No pathologic dilation of small or large bowel. Colonic diverticulosis without findings of acute diverticulitis. Vascular/Lymphatic: Aortic atherosclerosis. Normal caliber abdominal aorta. Smooth IVC contours. The portal, splenic and superior mesenteric veins are patent. Left external iliac lymph node measures 9 mm in short axis on image 102/2 previously 11 mm. -left obturator lymph node measures 2 mm in short axis on image 99/2 previously 4 mm. Reproductive: Uterus  is surgically absent. Nodular soft tissue along the left side of the vaginal cuff now measures 2.1 x 1.8 cm on image 98/2 previously 3.3 x 2.4 cm. Other: Peritoneal/omental thickening and nodularity is decreased from prior. For instance in the central abdomen subjacent to the umbilicus nodular focus measures 10 mm on image 79/2 previously 2.2 cm. No significant abdominopelvic free fluid. Musculoskeletal: No new suspicious lytic or blastic lesion of bone. Multilevel degenerative changes spine. IMPRESSION: 1. Findings consistent with treatment response including decreased size of the nodular soft tissue along the left side of the vaginal cuff, decreased size of the peritoneal/omental thickening and nodularity, and decreased size of the mediastinal, left external iliac and left obturator lymph nodes. 2. Similar appearance of the sclerotic lesions in the T2 and T8 vertebral bodies. No new suspicious osseous lesions. 3. Similar asymmetric thickening of the anterior gastric antrum measuring 15 mm, unchanged. 4.  Aortic Atherosclerosis (ICD10-I70.0). Electronically Signed   By:  Maudry Mayhew M.D.   On: 06/17/2023 14:46

## 2023-06-20 NOTE — Patient Instructions (Signed)
[facility]  Discharge Instructions: Thank you for choosing [facility] to provide your oncology and hematology care.   If you have a lab appointment with the Cancer Center, please go directly to the Cancer Center and check in at the registration area.   Wear comfortable clothing and clothing appropriate for easy access to any Portacath or PICC line.   We strive to give you quality time with your provider. You may need to reschedule your appointment if you arrive late (15 or more minutes).  Arriving late affects you and other patients whose appointments are after yours.  Also, if you miss three or more appointments without notifying the office, you may be dismissed from the clinic at the provider's discretion.      For prescription refill requests, have your pharmacy contact our office and allow 72 hours for refills to be completed.    Today you received the following chemotherapy and/or immunotherapy agents Jemperli To help prevent nausea and vomiting after your treatment, we encourage you to take your nausea medication as directed.  BELOW ARE SYMPTOMS THAT SHOULD BE REPORTED IMMEDIATELY: *FEVER GREATER THAN 100.4 F (38 C) OR HIGHER *CHILLS OR SWEATING *NAUSEA AND VOMITING THAT IS NOT CONTROLLED WITH YOUR NAUSEA MEDICATION *UNUSUAL SHORTNESS OF BREATH *UNUSUAL BRUISING OR BLEEDING *URINARY PROBLEMS (pain or burning when urinating, or frequent urination) *BOWEL PROBLEMS (unusual diarrhea, constipation, pain near the anus) TENDERNESS IN MOUTH AND THROAT WITH OR WITHOUT PRESENCE OF ULCERS (sore throat, sores in mouth, or a toothache) UNUSUAL RASH, SWELLING OR PAIN  UNUSUAL VAGINAL DISCHARGE OR ITCHING   Items with * indicate a potential emergency and should be followed up as soon as possible or go to the Emergency Department if any problems should occur.  Please show the CHEMOTHERAPY ALERT CARD or IMMUNOTHERAPY ALERT CARD at check-in to  the Emergency Department and triage nurse.  Should you have questions after your visit or need to cancel or reschedule your appointment, please contact [facility]  Dept: [phone]  and follow the prompts.  Office hours are 8:00 a.m. to 4:30 p.m. Monday - Friday. Please note that voicemails left after 4:00 p.m. may not be returned until the following business day.  We are closed weekends and major holidays. You have access to a nurse at all times for urgent questions. Please call the main number to the clinic Dept: [phone] and follow the prompts.   For any non-urgent questions, you may also contact your provider using MyChart. We now offer e-Visits for anyone 18 and older to request care online for non-urgent symptoms. For details visit [email or web address].   Also download the MyChart app! Go to the app store, search "MyChart", open the app, select [facility], and log in with your MyChart username and password.  

## 2023-06-20 NOTE — Assessment & Plan Note (Signed)
She has recent bowel habit changes which she attributed to eating cabbages She will continue to take laxatives as needed

## 2023-06-21 ENCOUNTER — Other Ambulatory Visit (HOSPITAL_COMMUNITY): Payer: Self-pay

## 2023-06-21 LAB — T4: T4, Total: 7.8 ug/dL (ref 4.5–12.0)

## 2023-06-22 ENCOUNTER — Other Ambulatory Visit: Payer: Self-pay

## 2023-06-26 ENCOUNTER — Other Ambulatory Visit (HOSPITAL_COMMUNITY): Payer: Self-pay

## 2023-06-27 ENCOUNTER — Other Ambulatory Visit: Payer: Self-pay

## 2023-07-01 ENCOUNTER — Telehealth: Payer: Self-pay

## 2023-07-01 ENCOUNTER — Other Ambulatory Visit: Payer: Self-pay

## 2023-07-01 ENCOUNTER — Ambulatory Visit: Payer: Medicare HMO

## 2023-07-01 DIAGNOSIS — C55 Malignant neoplasm of uterus, part unspecified: Secondary | ICD-10-CM

## 2023-07-01 NOTE — Telephone Encounter (Signed)
Returned her call. She starting vomiting Friday night and it continued thru Saturday. No vomiting since Saturday. She is feeling constipated and had a small bm today. She is taking laxatives.  She did not eat much yesterday, just soup and crackers. She thinks that the synthroid may be causing her symptoms. About 1-2 hours after the synthroid she starts having abdominal pain and cramping. Denies vomiting/ nausea today but she is just getting ready to take the synthroid..  She requesting appt and possible IV fluids. She is asking if she should take 1/2 tab of Synthroid or what does Dr. [name] recommend.

## 2023-07-01 NOTE — Telephone Encounter (Signed)
Called and given below message. She verbalized understanding. Offered IV fluids today at 1:30 pm and she is unable to come then due to having to pick up son. Given 8 am appt for IV fluids tomorrow. She is aware of appt.

## 2023-07-01 NOTE — Telephone Encounter (Signed)
Offer her IVF if available She can take synthroid every other day Continue laxatives for constipation

## 2023-07-02 ENCOUNTER — Inpatient Hospital Stay: Payer: Medicare HMO | Attending: Hematology and Oncology

## 2023-07-02 ENCOUNTER — Telehealth: Payer: Self-pay

## 2023-07-02 VITALS — BP 127/56 | HR 79 | Temp 98.8°F | Resp 18

## 2023-07-02 DIAGNOSIS — R112 Nausea with vomiting, unspecified: Secondary | ICD-10-CM | POA: Insufficient documentation

## 2023-07-02 DIAGNOSIS — I700 Atherosclerosis of aorta: Secondary | ICD-10-CM | POA: Diagnosis not present

## 2023-07-02 DIAGNOSIS — Z79890 Hormone replacement therapy (postmenopausal): Secondary | ICD-10-CM | POA: Diagnosis not present

## 2023-07-02 DIAGNOSIS — E785 Hyperlipidemia, unspecified: Secondary | ICD-10-CM | POA: Diagnosis not present

## 2023-07-02 DIAGNOSIS — C786 Secondary malignant neoplasm of retroperitoneum and peritoneum: Secondary | ICD-10-CM | POA: Diagnosis not present

## 2023-07-02 DIAGNOSIS — R140 Abdominal distension (gaseous): Secondary | ICD-10-CM

## 2023-07-02 DIAGNOSIS — R109 Unspecified abdominal pain: Secondary | ICD-10-CM | POA: Diagnosis not present

## 2023-07-02 DIAGNOSIS — Z800 Family history of malignant neoplasm of digestive organs: Secondary | ICD-10-CM | POA: Insufficient documentation

## 2023-07-02 DIAGNOSIS — Z90710 Acquired absence of both cervix and uterus: Secondary | ICD-10-CM | POA: Diagnosis not present

## 2023-07-02 DIAGNOSIS — Z885 Allergy status to narcotic agent status: Secondary | ICD-10-CM | POA: Diagnosis not present

## 2023-07-02 DIAGNOSIS — Z79899 Other long term (current) drug therapy: Secondary | ICD-10-CM | POA: Diagnosis not present

## 2023-07-02 DIAGNOSIS — I119 Hypertensive heart disease without heart failure: Secondary | ICD-10-CM | POA: Diagnosis not present

## 2023-07-02 DIAGNOSIS — E669 Obesity, unspecified: Secondary | ICD-10-CM | POA: Diagnosis not present

## 2023-07-02 DIAGNOSIS — Z90722 Acquired absence of ovaries, bilateral: Secondary | ICD-10-CM | POA: Insufficient documentation

## 2023-07-02 DIAGNOSIS — D649 Anemia, unspecified: Secondary | ICD-10-CM | POA: Insufficient documentation

## 2023-07-02 DIAGNOSIS — K219 Gastro-esophageal reflux disease without esophagitis: Secondary | ICD-10-CM | POA: Insufficient documentation

## 2023-07-02 DIAGNOSIS — R1115 Cyclical vomiting syndrome unrelated to migraine: Secondary | ICD-10-CM | POA: Insufficient documentation

## 2023-07-02 DIAGNOSIS — Z808 Family history of malignant neoplasm of other organs or systems: Secondary | ICD-10-CM | POA: Insufficient documentation

## 2023-07-02 DIAGNOSIS — R971 Elevated cancer antigen 125 [CA 125]: Secondary | ICD-10-CM | POA: Insufficient documentation

## 2023-07-02 DIAGNOSIS — Z818 Family history of other mental and behavioral disorders: Secondary | ICD-10-CM | POA: Insufficient documentation

## 2023-07-02 DIAGNOSIS — K5909 Other constipation: Secondary | ICD-10-CM | POA: Diagnosis not present

## 2023-07-02 DIAGNOSIS — Z8042 Family history of malignant neoplasm of prostate: Secondary | ICD-10-CM | POA: Insufficient documentation

## 2023-07-02 DIAGNOSIS — R188 Other ascites: Secondary | ICD-10-CM | POA: Diagnosis not present

## 2023-07-02 DIAGNOSIS — Z880 Allergy status to penicillin: Secondary | ICD-10-CM | POA: Insufficient documentation

## 2023-07-02 DIAGNOSIS — E1142 Type 2 diabetes mellitus with diabetic polyneuropathy: Secondary | ICD-10-CM | POA: Insufficient documentation

## 2023-07-02 DIAGNOSIS — Z7901 Long term (current) use of anticoagulants: Secondary | ICD-10-CM | POA: Diagnosis not present

## 2023-07-02 DIAGNOSIS — Z888 Allergy status to other drugs, medicaments and biological substances status: Secondary | ICD-10-CM | POA: Diagnosis not present

## 2023-07-02 DIAGNOSIS — C55 Malignant neoplasm of uterus, part unspecified: Secondary | ICD-10-CM | POA: Diagnosis present

## 2023-07-02 DIAGNOSIS — Z87891 Personal history of nicotine dependence: Secondary | ICD-10-CM | POA: Diagnosis not present

## 2023-07-02 DIAGNOSIS — Z803 Family history of malignant neoplasm of breast: Secondary | ICD-10-CM | POA: Insufficient documentation

## 2023-07-02 MED ORDER — SODIUM CHLORIDE 0.9% FLUSH
10.0000 mL | Freq: Once | INTRAVENOUS | Status: AC
  Administered 2023-07-02: 10 mL

## 2023-07-02 MED ORDER — SODIUM CHLORIDE 0.9 % IV SOLN: INTRAVENOUS | Status: AC

## 2023-07-02 MED ORDER — HEPARIN SOD (PORK) LOCK FLUSH 100 UNIT/ML IV SOLN
500.0000 [IU] | Freq: Once | INTRAVENOUS | Status: AC
  Administered 2023-07-02: 500 [IU]

## 2023-07-02 MED ORDER — FAMOTIDINE IN NACL 20-0.9 MG/50ML-% IV SOLN
20.0000 mg | Freq: Once | INTRAVENOUS | Status: AC
  Administered 2023-07-02: 20 mg via INTRAVENOUS
  Filled 2023-07-02: qty 50

## 2023-07-02 NOTE — Telephone Encounter (Signed)
I can see her on 9/20 Recommend frequent small meals and for her to document all her oral intake until I see her

## 2023-07-02 NOTE — Telephone Encounter (Signed)
Returned her call. She is concerned about her 11 lb weight loss. She feels better after IV fluids today.  At times she has stomach pain. She is doing a lot of belching. She is taking tums, Pepcid AC and Protonix. Denies constipation, last bm today. She will continue laxatives. Today was the first time she could eat solid food since Friday. Instructed to eat 5-6 small meals. Denies nausea/ vomiting. Offered IV fluids again and she declined. She is requesting earlier appt with Dr. [name]. She does not want to wait until next appt.

## 2023-07-03 ENCOUNTER — Encounter: Payer: Self-pay | Admitting: Hematology and Oncology

## 2023-07-03 ENCOUNTER — Other Ambulatory Visit: Payer: Self-pay

## 2023-07-03 DIAGNOSIS — C55 Malignant neoplasm of uterus, part unspecified: Secondary | ICD-10-CM

## 2023-07-03 NOTE — Progress Notes (Signed)
Symptom Management Consult Note [facility]    Patient Care Team: [name], MD as PCP - General (Internal Medicine) Rennis Golden Lisette Abu, MD as PCP - Cardiology (Cardiology)    Name / MRN / DOB: Sherri Gill  [id number]  10-16-1951   Date of visit: 07/04/2023   Chief Complaint/Reason for visit: abdominal pain   Current Therapy: Jemperli  Last treatment:  Day 1   Cycle 7 on 06/20/23   ASSESSMENT & PLAN: Patient is a 72 y.o. female with oncologic history of uterine cancer followed by Dr. [name].  I have viewed most recent oncology note and lab work.    #Uterine cancer - Next appointment with oncologist is 07/18/23   #Abdominal pain -Patient is well-appearing.  Afebrile, hemodynamically stable.  Abdominal exam is benign. -CBC remarkable for stable anemia.  CMP overall unremarkable.  UA is negative for obvious infection, will follow-up on culture. -Patient received 1 L of IV fluids for hydration support and IV Zofran.  On reassessment she is feeling much improved, eating a yogurt and drinking water. -Chart review shows CT CAP from 06/17/23 did not have any acute abdominal pathology. -Patient had first immunotherapy only treatment 8/23 and also started Synthroid. Symptoms started 1 week after this, which align more with possible adverse effects of Synthroid. Patient did have elevated TSH on 8/23 however T4 was WNL. Will have her hold Synthroid until next oncology appointment and it can be discussed further. -Discussed with patient if symptoms do not improve it is possible they are related to immunotherapy. Will hold off on steroids at this time as she has tolerated lower dose of Jemperli in the past. Patient knows to RTC if symptoms do not improve for reassessment.  Discussed plan with Dr. [name] who agrees. Strict ED precautions discussed should symptoms worsen.   Heme/Onc History: Oncology History Overview Note  High grade serous, Her2/neu neg, MMR  normal, MSI stable   Uterine cancer (HCC)  10/12/2021 Imaging   US pelvis  1. Heterogeneous endometrial thickening with appearance of moderate complex fluid in the endometrial canal, possible hemorrhagic material. Endometrial thickness is considered abnormal for an asymptomatic post-menopausal female. Endometrial sampling should be considered to exclude carcinoma. 2. Nonvisualized ovary 3. Heterogeneous thick-walled urinary bladder, correlate for cystitis.   12/28/2021 Pathology Results   CYTOLOGY - NON PAP  CASE: [id number]  PATIENT: Radonna Gronewold  Non-Gynecological Cytology Report   Clinical History: Abnormal pelvic US, highly suspicious of malignant ascites  Specimen Submitted:  A. ASCITES, PARACENTESIS:    FINAL MICROSCOPIC DIAGNOSIS:  - Malignant cells consistent with adenocarcinoma  - See comment   SPECIMEN ADEQUACY:  Satisfactory for evaluation   DIAGNOSTIC COMMENTS:  Immunohistochemical stains show that the tumor cells are positive for CK7 and PAX8 while they are negative for CK20, CDX2 and ER, consistent with above interpretation.  Additionally, the tumor cells are diffusely positive for p53 (clonal overexpression), suggestive of a high-grade serous carcinoma.    12/28/2021 Imaging   US pelvis 1. Persistent abnormal appearance of the endometrium, heterogeneously thickened and complex with possible intraluminal fluid. This is similar to that seen on December 2022 ultrasound. Endometrial thickness is considered abnormal for an asymptomatic post-menopausal female. Endometrial sampling should be considered to exclude carcinoma. 2. Nonvisualization of the ovaries.   12/28/2021 Imaging   1. Large volume ascites. 2. Small umbilical hernia containing fat and ascites. 3. Haziness of anterior omentum most likely related to ascites, other etiologies such as metastatic disease can  not be excluded. 4. Diffuse colonic diverticulosis without evidence for diverticulitis.   12/29/2021  Procedure   Successful ultrasound-guided paracentesis yielding 2.8 liters of peritoneal fluid.   01/14/2022 Initial Diagnosis   Uterine cancer (HCC)   01/14/2022 Cancer Staging   Staging form: Corpus Uteri - Carcinoma and Carcinosarcoma, AJCC 8th Edition - Clinical: Stage III (cT3, cN0, cM0) - Signed by Artis Delay, MD on 01/14/2022 Stage prefix: Initial diagnosis   01/15/2022 Surgery   Surgery: Cervical dilation under ultrasound guidance, hysteroscopy, endometrial sampling using the Myosure   Surgeons:  Carin Hock MD    Pathology: endometrial curetteings   Operative findings: On EUA, 8 cm mobile uterus, some nodularity along cul de sac. Cervix normal in appearance without discernible external os. Difficulty noted in dilating the cervix after using scalpel to incise in area of what what thought to be the external os. Under ultrasound guidance, cervix was dilated with confirmation on imaging of intra-uterine placement of dilators. On hysteroscopy, abnormal appearing endocervix and very calcified tissue obscuring good visualization of endometrium concerning for replacement by tumor.    01/15/2022 Pathology Results   FINAL MICROSCOPIC DIAGNOSIS:   A.   ENDOMETRIUM, CURETTAGE:  -    Serous endometrial carcinoma.   COMMENT:   The carcinoma is strongly and diffusely positive for p53 and diffuse p16 positivity.  The ER shows relatively diffuse positivity.  Overall, the histologic features (slit-like glandular spaces and psammoma bodies) and the immunohistochemical phenotype are most characteristic for a serous carcinoma.    01/21/2022 Procedure   Placement of single lumen port a cath via right internal jugular vein. The catheter tip lies at the cavo-atrial junction. A power injectable port a cath was placed and is ready for immediate use.     01/25/2022 - 06/21/2022 Chemotherapy   Patient is on Treatment Plan : UTERINE Carboplatin AUC 6 / Paclitaxel q21d     03/21/2022 Imaging   1. Near  complete resolution of previously noted ascites, with only small volume residual perihepatic ascites. 2. Improved omental caking and stranding in the ventral abdomen. 3. Findings are consistent with treatment response of peritoneal metastatic disease. 4. Thickening of the urinary bladder wall , consistent with nonspecific infectious or inflammatory cystitis. Correlate with urinalysis.   Aortic Atherosclerosis (ICD10-I70.0).       03/22/2022 Tumor Marker   Patient's tumor was tested for the following markers: CA-125. Results of the tumor marker test revealed 21.5.   04/09/2022 Surgery     Surgery: Diagnostic laparoscopy, conversion to exploratory laparotomy, partial omentectomy, lysis of adhesions for approximately 60 minutes, total abdominal hysterectomy, bilateral salpingo-oophorectomy, excision of ileal tumor implant and excision of transverse colon epiploica tumor implant   04/09/2022 Pathology Results   A.   OMENTUM, RESECTION:  -    Metastatic serous carcinoma.   B.   ILEAL TUMOR IMPLANTS, EXCISION:  -    Adhesions with small focus of metastatic serous carcinoma, possibly  intralymphatic.   C.   UTERUS, CERVIX, BILATERAL FALLOPIAN TUBES AND OVARIES:  -    Serous carcinoma, endometrial, invasive into outer half of uterus,  with involvement of  cervical stroma, uterine serosa, ovaries and fallopian tubes.   -    Negative for cervical dysplasia.  -    Leiomyomas, largest 1.2 cm in greatest dimension.   D.   COLON, MESENTARY NODULE, TRANSVERSE, EXCISION:  -    Metastatic serous carcinoma   05/09/2022 Tumor Marker   Patient's tumor was tested for the following markers: CA-125.  Results of the tumor marker test revealed 12.1.   05/29/2022 Tumor Marker   Patient's tumor was tested for the following markers: CA-125. Results of the tumor marker test revealed 9.1.   07/23/2022 Imaging   1. No abdominopelvic lymphadenopathy.  2. Scattered foci of fat stranding in the anterior pelvic  peritoneal fat without discrete solid implants. Mild smooth wall thickening of the pelvic peritoneum. These findings are nonspecific and could represent postsurgical/post treatment change, although residual peritoneal carcinomatosis is on the differential and close follow-up CT is suggested. 3. Moderate left colonic diverticulosis. 4. Aortic Atherosclerosis (ICD10-I70.0).   07/23/2022 Tumor Marker   Patient's tumor was tested for the following markers: CA-125. Results of the tumor marker test revealed 5.2.   09/09/2022 Tumor Marker   Patient's tumor was tested for the following markers: CA-125. Results of the tumor marker test revealed 10.1.   01/28/2023 Imaging   OMENTAL AND PERITONEAL CARCINOMATOSIS.   POTENTIAL SOURCES ARE GASTRIC (MID AND DISTAL GASTRIC WALL THICKENING) OR GENITOURINARY (SOFT TISSUE MASS EXTENDS CEPHALAD FROM THE LEFT CERVICAL CUFF TOWARDS THE LEFT ADNEXA).   IF GASTRIC, THE PELVIC MASS WOULD REPRESENT AN OMENTAL DEPOSIT. PELVIC MASS OBSTRUCTING LEFT KIDNEY, MILD HYDROURETERONEPHROSIS.   MEDIASTINAL ADENOPATHY, LIKELY NODAL DISEASE.    02/03/2023 Tumor Marker   Patient's tumor was tested for the following markers: CA-125. Results of the tumor marker test revealed 429.   02/07/2023 -  Chemotherapy   Patient is on Treatment Plan : UTERINE ENDOMETRIAL Dostarlimab-gxly (500 mg) + Carboplatin (AUC 5) + Paclitaxel (175 mg/m2) q21d x 6 cycles / Dostarlimab-gxly (1000 mg) q42d x 6 cycles      03/03/2023 Tumor Marker   Patient's tumor was tested for the following markers: CA-125. Results of the tumor marker test revealed 176.   04/09/2023 Imaging   CT CHEST ABDOMEN PELVIS W CONTRAST  Result Date: 04/08/2023 CLINICAL DATA:  Endometrial cancer/uterine cancer. Evaluate treatment response to chemotherapy. * Tracking Code: BO * EXAM: CT CHEST, ABDOMEN, AND PELVIS WITH CONTRAST TECHNIQUE: Multidetector CT imaging of the chest, abdomen and pelvis was performed following the standard  protocol during bolus administration of intravenous contrast. RADIATION DOSE REDUCTION: This exam was performed according to the departmental dose-optimization program which includes automated exposure control, adjustment of the mA and/or kV according to patient size and/or use of iterative reconstruction technique. CONTRAST:  OMNIPAQUE IOHEXOL 300 MG/ML  SOLN COMPARISON:  Outside CTs of 01/28/2023. Report available in the Care everywhere epic system. FINDINGS: CT CHEST FINDINGS Cardiovascular: Right Port-A-Cath tip mid right atrium. Bovine arch. Aortic atherosclerosis. Mild cardiomegaly, without pericardial effusion. No central pulmonary embolism, on this non-dedicated study. Mediastinum/Nodes: No supraclavicular adenopathy. Right paratracheal node measures 7 mm and 16/2 versus 11 mm on the prior exam (when remeasured). No hilar adenopathy. Resolution of prevascular adenopathy with an index node measuring 5 mm on 18/2 versus up to 13 mm on the prior exam (when remeasured). Lungs/Pleura: No pleural fluid.  Clear lungs. Musculoskeletal: Although lack of sagittal reformats on the prior exam makes direct comparison challenging, there are sclerotic lesions within the T8 and T2 vertebral bodies on sagittal image 99 which are felt to be new since the prior. CT ABDOMEN PELVIS FINDINGS Hepatobiliary: No focal liver lesion. Normal gallbladder, without biliary ductal dilatation. Pancreas: Normal, without mass or ductal dilatation. Spleen: Normal in size, without focal abnormality. Adrenals/Urinary Tract: Normal adrenal glands. Normal kidneys, without hydronephrosis. The bladder is decompressed. Apparent wall thickening may be secondary. Stomach/Bowel: The gastric antrum is underdistended and  appears thick walled including at 1.5 cm on 59/2. Similar 1.6 cm on the prior. Colonic stool burden suggests constipation. Scattered colonic diverticula. Normal terminal ileum and appendix. Borderline prominent upper pelvic  fluid-filled small bowel loops of up to 2.5 cm. No proximal small bowel distension. Vascular/Lymphatic: Aortic atherosclerosis. No abdominal adenopathy. A left external iliac node measures 11 mm on 101/2 is similar to on the prior exam (when remeasured). Decreased size of tiny left obturator nodes including up to 4 mm today versus 9 mm on the prior. Reproductive: Hysterectomy. Other: Omental thickening/nodularity again identified. Example right abdomen at 9 mm on 74/2 versus 14 mm on the prior exam (when remeasured). Soft tissue fullness about the superior aspect of the left vaginal cuff is less well-defined today. Example at on the order of 3.3 x 2.4 cm on 97/2 versus 5.3 x 5.0 cm when remeasured in a similar fashion on the prior. Mild ventral/periumbilical hernia or laxity contains fat and peritoneal tumor, similar on 77/2. Musculoskeletal: No acute osseous abnormality. IMPRESSION: 1. Response to therapy since outside CTs of 01/28/2023. Improvement in abdominopelvic nodal/peritoneal metastasis. Resolution of thoracic adenopathy. 2. Developing sclerotic lesions within the thoracic spine which given response to therapy elsewhere likely represent treated previously CT occult metastasis. 3. Areas of borderline to mild small bowel dilatation within the upper pelvis could represent ileus or low-grade partial obstruction, given peritoneal disease. 4.  Aortic Atherosclerosis (ICD10-I70.0). 5. Similar gastric antral thickening in the setting of underdistention. Suspicious for gastritis. Electronically Signed   By: Jeronimo Greaves M.D.   On: 04/08/2023 16:29      04/14/2023 Tumor Marker   Patient's tumor was tested for the following markers: CA-125. Results of the tumor marker test revealed 100.   05/02/2023 Tumor Marker   Patient's tumor was tested for the following markers: CA-125. Results of the tumor marker test revealed 82.2.   06/02/2023 Tumor Marker   Patient's tumor was tested for the following markers:  CA-125. Results of the tumor marker test revealed 166.   06/17/2023 Imaging   CT CHEST ABDOMEN PELVIS W CONTRAST  Result Date: 06/17/2023 CLINICAL DATA:  History of endometrial cancer/uterine cancer. Evaluate treatment response to chemotherapy. * Tracking Code: BO *. EXAM: CT CHEST, ABDOMEN, AND PELVIS WITH CONTRAST TECHNIQUE: Multidetector CT imaging of the chest, abdomen and pelvis was performed following the standard protocol during bolus administration of intravenous contrast. RADIATION DOSE REDUCTION: This exam was performed according to the departmental dose-optimization program which includes automated exposure control, adjustment of the mA and/or kV according to patient size and/or use of iterative reconstruction technique. CONTRAST:  OMNIPAQUE IOHEXOL 300 MG/ML  SOLN COMPARISON:  Multiple priors including most recent CT April 08, 2023 FINDINGS: CT CHEST FINDINGS Cardiovascular: Right chest Port-A-Cath with tip in the right atrium. Aortic atherosclerosis. No central pulmonary embolus on this nondedicated study. Normal size heart. No significant pericardial effusion/thickening. Mediastinum/Nodes: No suspicious thyroid nodule. No pathologically enlarged mediastinal, hilar or axillary lymph nodes. Right paratracheal lymph node now measures 5 mm in short axis on image 17/2 previously 7 mm prevascular lymph node now measures 3 mm in short axis on image 20/2 previously 5 mm the esophagus is grossly unremarkable. Lungs/Pleura: No suspicious pulmonary nodules or masses. No pleural effusion. No pneumothorax. Musculoskeletal: Similar appearance of the sclerotic lesions in the T2 and T8 vertebral bodies. No new aggressive lytic or blastic lesion of bone. CT ABDOMEN PELVIS FINDINGS Hepatobiliary: No suspicious hepatic lesion. Gallbladder is unremarkable. No biliary ductal dilation. Pancreas: No  pancreatic ductal dilation or evidence of acute inflammation Spleen: No splenomegaly. Adrenals/Urinary Tract:  Bilateral adrenal glands appear normal. No hydronephrosis. Kidneys demonstrate symmetric enhancement. Urinary bladder is minimally distended limiting evaluation Stomach/Bowel: No radiopaque enteric contrast material was administered. Similar asymmetric thickening of the anterior gastric antrum measuring 15 mm on image 64/2, unchanged. No pathologic dilation of small or large bowel. Colonic diverticulosis without findings of acute diverticulitis. Vascular/Lymphatic: Aortic atherosclerosis. Normal caliber abdominal aorta. Smooth IVC contours. The portal, splenic and superior mesenteric veins are patent. Left external iliac lymph node measures 9 mm in short axis on image 102/2 previously 11 mm. -left obturator lymph node measures 2 mm in short axis on image 99/2 previously 4 mm. Reproductive: Uterus is surgically absent. Nodular soft tissue along the left side of the vaginal cuff now measures 2.1 x 1.8 cm on image 98/2 previously 3.3 x 2.4 cm. Other: Peritoneal/omental thickening and nodularity is decreased from prior. For instance in the central abdomen subjacent to the umbilicus nodular focus measures 10 mm on image 79/2 previously 2.2 cm. No significant abdominopelvic free fluid. Musculoskeletal: No new suspicious lytic or blastic lesion of bone. Multilevel degenerative changes spine. IMPRESSION: 1. Findings consistent with treatment response including decreased size of the nodular soft tissue along the left side of the vaginal cuff, decreased size of the peritoneal/omental thickening and nodularity, and decreased size of the mediastinal, left external iliac and left obturator lymph nodes. 2. Similar appearance of the sclerotic lesions in the T2 and T8 vertebral bodies. No new suspicious osseous lesions. 3. Similar asymmetric thickening of the anterior gastric antrum measuring 15 mm, unchanged. 4.  Aortic Atherosclerosis (ICD10-I70.0). Electronically Signed   By: Maudry Mayhew M.D.   On: 06/17/2023 14:46           Interval history-: Sherri Gill is a 72 y.o. female with oncologic history as above presenting to [facility] today with chief complaint of abdominal pain.  She presents unaccompanied to clinic.  Patient states she has been feeling unwell for the last week.  Day of symptom onset she had nausea and vomiting.  Over the next 2 days she continued to have nausea and 3 episodes daily of nonbloody nonbilious emesis.  She then became constipated however after taking Senokot bowel movements returned to normal. BM today was normal.  She admits her nausea has been pretty constant and causing her to have decreased p.o. intake.  She has not taken any antiemetics prior to arrival.  She is endorsing a localized gnawing aching pain located in her upper abdomen. Denies history of similar pain.  This is intermittent and has been going on for approximately 1 week. Pain is not associated with food.  She notes that the pain starts after she takes Synthroid.  She was started on this medication on 06/20/2023.  She felt that she was having side effects and was advised to take it every other day starting on 07/01/2023.  Patient does have Dilaudid medication at home which she has been taking for her abdominal pain.  She started taking Dilaudid with Synthroid.  She notes that yesterday when she did this she did not have the abdominal pain.   She denies any fever, chills, chest pain, urinary symptoms. Denies seeing any blood in stool.      ROS  All other systems are reviewed and are negative for acute change except as noted in the HPI.    Allergies  Allergen Reactions   Codeine Sulfate Nausea And Vomiting and Other (  See Comments)    Dizziness   Penicillins Nausea And Vomiting   Percocet [Oxycodone-Acetaminophen] Nausea And Vomiting   Zestril [Lisinopril] Cough     Past Medical History:  Diagnosis Date   AKI (acute kidney injury) (HCC)    History of   Ascites    09/2021 paracentesis with almost 3 L of fluid  12/2021 Paracentesis performed with 2.8 L   Carpal tunnel syndrome 02/11/2014   Chronic anemia 08/29/2012   Need colonoscopy or report. Need iron panel.    Dyslipidemia 06/30/2007   Dyspnea    Essential hypertension, benign 06/30/2007   GERD 06/30/2007   History of blood transfusion    History of fatty infiltration of liver    History of migraine    Hx of Herpes simplex meningitis 2015   Also noted to have primary empty sella on imaging at this admission   Insomnia 06/11/2012   Major depressive disorder, recurrent episode, moderate with anxious distress (HCC) 06/30/2007   Nausea and vomiting 01/13/2022   Obesity, BMI 35-40 06/11/2012   Peripheral neuropathy 2/2 T2DM 12/28/2009   Primary empty sella syndrome (HCC) 2015   Noted on imaging during hospitalization for herpes meningitis; no pituitary mass, no hormone w/u at that time, hormonally asymptomatic   Type 2 diabetes mellitus with neurological complications (HCC) 06/30/2007     Past Surgical History:  Procedure Laterality Date   COLONOSCOPY W/ POLYPECTOMY  06/28/2004   DENTAL SURGERY     DILATATION & CURETTAGE/HYSTEROSCOPY WITH MYOSURE N/A 01/15/2022   Procedure: DILATATION & CURETTAGE/HYSTEROSCOPY WITH MYOSURE;  Surgeon: Carver Fila, MD;  Location: [facility];  Service: Gynecology;  Laterality: N/A;  DO NOT OPEN HYSTEROSCOPY KIT   HYSTERECTOMY ABDOMINAL WITH SALPINGO-OOPHORECTOMY Bilateral 04/09/2022   Procedure: HYSTERECTOMY ABDOMINAL BILATERAL SALPINGO OOPHORECTOMY WITH OMENTECTOMY ,DEBULKING;  Surgeon: Carver Fila, MD;  Location: [facility];  Service: Gynecology;  Laterality: Bilateral;   IR IMAGING GUIDED PORT INSERTION  01/18/2022   LAPAROSCOPY N/A 04/09/2022   Procedure: LAPAROSCOPY DIAGNOSTIC;  Surgeon: Carver Fila, MD;  Location: [facility];  Service: Gynecology;  Laterality: N/A;   OPERATIVE ULTRASOUND N/A 01/15/2022   Procedure: OPERATIVE ULTRASOUND;  Surgeon: Carver Fila, MD;  Location: [facility];   Service: Gynecology;  Laterality: N/A;   port a cath placement     TUBAL LIGATION     age 62    Social History   Socioeconomic History   Marital status: Single    Spouse name: Not on file   Number of children: Not on file   Years of education: 12   Highest education level: Not on file  Occupational History   Occupation: Airline pilot    Employer: [employer]  Tobacco Use   Smoking status: Former    Current packs/day: 0.00    Average packs/day: 0.3 packs/day for 10.0 years (2.5 ttl pk-yrs)    Types: Cigarettes    Start date: 10/28/1968    Quit date: 10/28/1978    Years since quitting: 44.7   Smokeless tobacco: Never  Vaping Use   Vaping status: Never Used  Substance and Sexual Activity   Alcohol use: No    Alcohol/week: 0.0 standard drinks of alcohol   Drug use: No   Sexual activity: Not Currently    Partners: Male    Birth control/protection: Post-menopausal  Other Topics Concern   Not on file  Social History Narrative   Not on file   Social Determinants of Health   Financial Resource Strain: Medium Risk (02/20/2023)  Overall Financial Resource Strain (CARDIA)    Difficulty of Paying Living Expenses: Somewhat hard  Food Insecurity: No Food Insecurity (04/21/2023)   Hunger Vital Sign    Worried About Running Out of Food in the Last Year: Never true    Ran Out of Food in the Last Year: Never true  Transportation Needs: No Transportation Needs (04/21/2023)   PRAPARE - Administrator, Civil Service (Medical): No    Lack of Transportation (Non-Medical): No  Physical Activity: Inactive (02/20/2023)   Exercise Vital Sign    Days of Exercise per Week: 0 days    Minutes of Exercise per Session: 0 min  Stress: Stress Concern Present (02/20/2023)   [employer] of Occupational Health - Occupational Stress Questionnaire    Feeling of Stress : To some extent  Social Connections: Unknown (02/20/2023)   Social Connection and Isolation Panel [NHANES]    Frequency of  Communication with Friends and Family: More than three times a week    Frequency of Social Gatherings with Friends and Family: Once a week    Attends Religious Services: Patient declined    Database administrator or Organizations: No    Attends Banker Meetings: Patient declined    Marital Status: Patient declined  Intimate Partner Violence: Not At Risk (02/20/2023)   Humiliation, Afraid, Rape, and Kick questionnaire    Fear of Current or Ex-Partner: No    Emotionally Abused: No    Physically Abused: No    Sexually Abused: No    Family History  Adopted: Yes  Problem Relation Age of Onset   Dementia Mother    Breast cancer Sister        paternal half-sister   Liver cancer Brother        maternal half-brother   Thyroid cancer Maternal Aunt    Prostate cancer Maternal Uncle    Prostate cancer Cousin    Breast cancer Other    Ovarian cancer Neg Hx    Colon cancer Neg Hx    Endometrial cancer Neg Hx    Pancreatic cancer Neg Hx      Current Outpatient Medications:    acetaminophen (TYLENOL) 500 MG tablet, Take 1,000 mg by mouth every 6 (six) hours as needed for mild pain., Disp: , Rfl:    albuterol (VENTOLIN HFA) 108 (90 Base) MCG/ACT inhaler, Inhale 2 puffs into the lungs every 4 (four) hours as needed for wheezing or shortness of breath., Disp: 8 g, Rfl: 0   atorvastatin (LIPITOR) 20 MG tablet, Take 1 tablet (20 mg total) by mouth at bedtime., Disp: 30 tablet, Rfl: 11   B Complex-C (B-COMPLEX WITH VITAMIN C) tablet, Take 1 tablet by mouth in the morning., Disp: , Rfl:    Blood Glucose Monitoring Suppl (ONE TOUCH ULTRA MINI) w/Device KIT, Please use as directed., Disp: 1 each, Rfl: 0   calcium carbonate (TUMS - DOSED IN MG ELEMENTAL CALCIUM) 500 MG chewable tablet, Chew 1 tablet by mouth 2 (two) times daily., Disp: , Rfl:    Cholecalciferol 1000 units capsule, Take 1 capsule (1,000 Units total) by mouth daily., Disp: 90 capsule, Rfl: 1   diltiazem (CARDIZEM CD) 120 MG  24 hr capsule, Take 1 capsule (120 mg total) by mouth daily., Disp: 30 capsule, Rfl: 11   dorzolamide-timolol (COSOPT) 22.3-6.8 MG/ML ophthalmic solution, Place 1 drop into both eyes 2 (two) times daily., Disp: , Rfl:    glucose blood (ONE TOUCH ULTRA TEST) test strip, Use as  instructed, Disp: 100 each, Rfl: 1   hydrALAZINE (APRESOLINE) 25 MG tablet, TAKE 1 TABLET BY MOUTH THREE TIMES DAILY, Disp: 90 tablet, Rfl: 3   HYDROmorphone (DILAUDID) 2 MG tablet, Take 1 tablet (2 mg) by mouth 3 times daily as needed for severe pain., Disp: 30 tablet, Rfl: 0   Insulin Pen Needle 32G X 4 MM MISC, 1 each by Does not apply route daily., Disp: 100 each, Rfl: 3   latanoprost (XALATAN) 0.005 % ophthalmic solution, Place 1 drop into both eyes at bedtime., Disp: , Rfl:    levothyroxine (SYNTHROID) 50 MCG tablet, Take 1 tablet (50 mcg total) by mouth daily before breakfast., Disp: 30 tablet, Rfl: 1   lidocaine-prilocaine (EMLA) cream, Apply to affected area once as directed, Disp: 30 g, Rfl: 3   losartan (COZAAR) 100 MG tablet, Take 1 tablet (100 mg total) by mouth at bedtime., Disp: 90 tablet, Rfl: 3   magnesium oxide (MAG-OX) 400 (240 Mg) MG tablet, Take 1 tablet (400 mg total) by mouth 2 (two) times daily., Disp: 60 tablet, Rfl: 1   metFORMIN (GLUCOPHAGE) 1000 MG tablet, Take 1 tablet (1,000 mg total) by mouth 2 (two) times daily., Disp: 180 tablet, Rfl: 3   ondansetron (ZOFRAN) 8 MG tablet, Take 1 tablet (8 mg total) by mouth every 8 (eight) hours as needed for nausea or vomiting. Start on the third day after chemotherapy., Disp: 30 tablet, Rfl: 1   ONETOUCH DELICA LANCETS 33G MISC, Please use as directed., Disp: 100 each, Rfl: 0   pantoprazole (PROTONIX) 40 MG tablet, Take 1 tablet by mouth daily., Disp: 360 tablet, Rfl: 0   polyethylene glycol powder (GLYCOLAX/MIRALAX) 17 GM/SCOOP powder, Take 17 g by mouth daily., Disp: 238 g, Rfl: 1   prochlorperazine (COMPAZINE) 10 MG tablet, Take 1 tablet (10 mg total) by  mouth every 6 (six) hours as needed for nausea or vomiting., Disp: 30 tablet, Rfl: 1   rivaroxaban (XARELTO) 20 MG TABS tablet, TAKE 1 TABLET BY MOUTH ONCE DAILY WITH SUPPER, Disp: 90 tablet, Rfl: 3  PHYSICAL EXAM: ECOG FS:1 - Symptomatic but completely ambulatory   T: 98.4 BP: 147/64 HR: 67 SpO2: 97% RA Resp: 18 Physical Exam Vitals and nursing note reviewed.  Constitutional:      Appearance: She is not ill-appearing or toxic-appearing.  HENT:     Head: Normocephalic.  Eyes:     Conjunctiva/sclera: Conjunctivae normal.  Cardiovascular:     Rate and Rhythm: Normal rate and regular rhythm.     Pulses: Normal pulses.     Heart sounds: Normal heart sounds.  Pulmonary:     Effort: Pulmonary effort is normal.     Breath sounds: Normal breath sounds.  Abdominal:     General: Bowel sounds are normal. There is no distension.     Palpations: Abdomen is soft.     Tenderness: There is no abdominal tenderness. There is no right CVA tenderness, left CVA tenderness, guarding or rebound.  Musculoskeletal:     Cervical back: Normal range of motion.  Skin:    General: Skin is warm and dry.  Neurological:     Mental Status: She is alert.        LABORATORY DATA: I have reviewed the data as listed    Latest Ref Rng & Units 07/04/2023    9:56 AM 06/20/2023    8:44 AM 05/30/2023    8:12 AM  CBC  WBC 4.0 - 10.5 K/uL 5.9  6.0  6.5  Hemoglobin 12.0 - 15.0 g/dL 8.7  8.4  8.7   Hematocrit 36.0 - 46.0 % 27.0  26.6  26.9   Platelets 150 - 400 K/uL 244  250  240         Latest Ref Rng & Units 07/04/2023    9:56 AM 06/20/2023    8:44 AM 05/30/2023    8:12 AM  CMP  Glucose 70 - 99 mg/dL 440  102  725   BUN 8 - 23 mg/dL 13  15  20    Creatinine 0.44 - 1.00 mg/dL 3.66  4.40  3.47   Sodium 135 - 145 mmol/L 136  139  141   Potassium 3.5 - 5.1 mmol/L 3.4  4.0  3.8   Chloride 98 - 111 mmol/L 103  105  107   CO2 22 - 32 mmol/L 28  26  25    Calcium 8.9 - 10.3 mg/dL 9.2  9.2  9.7   Total Protein 6.5 -  8.1 g/dL 6.6  6.9  7.6   Total Bilirubin 0.3 - 1.2 mg/dL 0.3  0.3  0.3   Alkaline Phos 38 - 126 U/L 115  139  164   AST 15 - 41 U/L 9  9  9    ALT 0 - 44 U/L 5  6  6         RADIOGRAPHIC STUDIES (from last 24 hours if applicable) I have personally reviewed the radiological images as listed and agreed with the findings in the report. No results found.      Visit Diagnosis: 1. Malignant neoplasm of uterus, unspecified site (HCC)   2. Abdominal pain, unspecified abdominal location      No orders of the defined types were placed in this encounter.   All questions were answered. The patient knows to call the clinic with any problems, questions or concerns. No barriers to learning was detected.  A total of more than 20 minutes were spent on this encounter with face-to-face time and non-face-to-face time, including preparing to see the patient, ordering tests and/or medications, counseling the patient and coordination of care as outlined above.    Thank you for allowing me to participate in the care of this patient.    Shanon Ace, PA-C Department of Hematology/Oncology [facility] at [facility] Phone: [phone]  [phone]    07/04/2023 12:35 PM

## 2023-07-03 NOTE — Telephone Encounter (Signed)
Called and given below message. She will start a food diary today. Scheduled appt with Dr. [name] on 9/20 at 0940. Sent a referral to dietician. She is taking Synthroid every other day. She will continue Tums, Pepcid AC and Protonix. Denies constipation. She is having abdominal pain today with belching. She has taken oxycodone pain medication.  She would like to see [facility] tomorrow w/ possible IV fluids. Told her I would call her back.  Called back and given 0830 appt for [facility] with IV fluids. She is agreeable and aware of appts.

## 2023-07-04 ENCOUNTER — Inpatient Hospital Stay: Payer: Medicare HMO

## 2023-07-04 ENCOUNTER — Telehealth: Payer: Self-pay

## 2023-07-04 ENCOUNTER — Inpatient Hospital Stay (HOSPITAL_BASED_OUTPATIENT_CLINIC_OR_DEPARTMENT_OTHER): Payer: Medicare HMO | Admitting: Physician Assistant

## 2023-07-04 VITALS — BP 142/64 | HR 65 | Temp 98.4°F | Resp 18 | Ht 63.0 in | Wt 173.2 lb

## 2023-07-04 DIAGNOSIS — C55 Malignant neoplasm of uterus, part unspecified: Secondary | ICD-10-CM | POA: Diagnosis not present

## 2023-07-04 DIAGNOSIS — R109 Unspecified abdominal pain: Secondary | ICD-10-CM

## 2023-07-04 DIAGNOSIS — R630 Anorexia: Secondary | ICD-10-CM

## 2023-07-04 LAB — CBC WITH DIFFERENTIAL (CANCER CENTER ONLY)
Abs Immature Granulocytes: 0.02 10*3/uL (ref 0.00–0.07)
Basophils Absolute: 0 10*3/uL (ref 0.0–0.1)
Basophils Relative: 0 %
Eosinophils Absolute: 0.1 10*3/uL (ref 0.0–0.5)
Eosinophils Relative: 1 %
HCT: 27 % — ABNORMAL LOW (ref 36.0–46.0)
Hemoglobin: 8.7 g/dL — ABNORMAL LOW (ref 12.0–15.0)
Immature Granulocytes: 0 %
Lymphocytes Relative: 20 %
Lymphs Abs: 1.2 10*3/uL (ref 0.7–4.0)
MCH: 28.1 pg (ref 26.0–34.0)
MCHC: 32.2 g/dL (ref 30.0–36.0)
MCV: 87.1 fL (ref 80.0–100.0)
Monocytes Absolute: 0.7 10*3/uL (ref 0.1–1.0)
Monocytes Relative: 12 %
Neutro Abs: 3.9 10*3/uL (ref 1.7–7.7)
Neutrophils Relative %: 67 %
Platelet Count: 244 10*3/uL (ref 150–400)
RBC: 3.1 MIL/uL — ABNORMAL LOW (ref 3.87–5.11)
RDW: 16.1 % — ABNORMAL HIGH (ref 11.5–15.5)
WBC Count: 5.9 10*3/uL (ref 4.0–10.5)
nRBC: 0 % (ref 0.0–0.2)

## 2023-07-04 LAB — CMP (CANCER CENTER ONLY)
ALT: 5 U/L (ref 0–44)
AST: 9 U/L — ABNORMAL LOW (ref 15–41)
Albumin: 3.8 g/dL (ref 3.5–5.0)
Alkaline Phosphatase: 115 U/L (ref 38–126)
Anion gap: 5 (ref 5–15)
BUN: 13 mg/dL (ref 8–23)
CO2: 28 mmol/L (ref 22–32)
Calcium: 9.2 mg/dL (ref 8.9–10.3)
Chloride: 103 mmol/L (ref 98–111)
Creatinine: 0.81 mg/dL (ref 0.44–1.00)
GFR, Estimated: >60 mL/min (ref >60)
Glucose, Bld: 100 mg/dL — ABNORMAL HIGH (ref 70–99)
Potassium: 3.4 mmol/L — ABNORMAL LOW (ref 3.5–5.1)
Sodium: 136 mmol/L (ref 135–145)
Total Bilirubin: 0.3 mg/dL (ref 0.3–1.2)
Total Protein: 6.6 g/dL (ref 6.5–8.1)

## 2023-07-04 LAB — URINALYSIS, COMPLETE (UACMP) WITH MICROSCOPIC
Bilirubin Urine: NEGATIVE
Glucose, UA: NEGATIVE mg/dL
Hgb urine dipstick: NEGATIVE
Ketones, ur: 5 mg/dL — AB
Leukocytes,Ua: NEGATIVE
Nitrite: NEGATIVE
Protein, ur: NEGATIVE mg/dL
Specific Gravity, Urine: 1.019 (ref 1.005–1.030)
pH: 5 (ref 5.0–8.0)

## 2023-07-04 MED ORDER — SODIUM CHLORIDE 0.9 % IV SOLN: Freq: Once | INTRAVENOUS | Status: AC

## 2023-07-04 MED ORDER — SODIUM CHLORIDE 0.9% FLUSH
10.0000 mL | Freq: Once | INTRAVENOUS | Status: AC
  Administered 2023-07-04: 10 mL

## 2023-07-04 MED ORDER — ONDANSETRON HCL 4 MG/2ML IJ SOLN
4.0000 mg | Freq: Once | INTRAMUSCULAR | Status: AC
  Administered 2023-07-04: 4 mg via INTRAVENOUS
  Filled 2023-07-04: qty 2

## 2023-07-04 MED ORDER — HEPARIN SOD (PORK) LOCK FLUSH 100 UNIT/ML IV SOLN
500.0000 [IU] | Freq: Once | INTRAVENOUS | Status: AC
  Administered 2023-07-04: 500 [IU]

## 2023-07-04 NOTE — Telephone Encounter (Signed)
Patient called with results from urinalysis.  Per Daphane Shepherd, PA-C, patient's UA does not show clear signs of a UTI. Patient should expect call back when urine culture results. Patient verbalized an understanding of the information and knows that further results are pending.

## 2023-07-04 NOTE — Patient Instructions (Signed)
Nausea and Vomiting, Adult Nausea is the feeling that you have an upset stomach or that you are about to vomit. As nausea gets worse, it can lead to vomiting. Vomiting is when stomach contents forcefully come out of your mouth as a result of nausea. Vomiting can make you feel weak and cause you to become dehydrated. Dehydration can make you feel tired and thirsty, cause you to have a dry mouth, and decrease how often you urinate. Older adults and people with other diseases or a weak disease-fighting system (immune system) are at higher risk for dehydration. It is important to treat your nausea and vomiting as told by your health care provider. Follow these instructions at home: Watch your symptoms for any changes. Tell your health care provider about them. Eating and drinking     Take an oral rehydration solution (ORS). This is a drink that is sold at pharmacies and retail stores. Drink clear fluids slowly and in small amounts as you are able. Clear fluids include water, ice chips, low-calorie sports drinks, and fruit juice that has water added (diluted fruit juice). Eat bland, easy-to-digest foods in small amounts as you are able. These foods include bananas, applesauce, rice, lean meats, toast, and crackers. Avoid fluids that contain a lot of sugar or caffeine, such as energy drinks, sports drinks, and soda. Avoid alcohol. Avoid spicy or fatty foods. General instructions Take over-the-counter and prescription medicines only as told by your health care provider. Drink enough fluid to keep your urine pale yellow. Wash your hands often using soap and water for at least 20 seconds. If soap and water are not available, use hand sanitizer. Make sure that everyone in your household washes their hands well and often. Rest at home while you recover. Watch your condition for any changes. Take slow and deep breaths when you feel nauseous. Keep all follow-up visits. This is important. Contact a health  care provider if: Your symptoms get worse. You have new symptoms. You have a fever. You cannot drink fluids without vomiting. Your nausea does not go away after 2 days. You feel light-headed or dizzy. You have a headache. You have muscle cramps. You have a rash. You have pain while urinating. Get help right away if: You have pain in your chest, neck, arm, or jaw. You feel extremely weak or you faint. You have persistent vomiting. You have vomit that is bright red or looks like black coffee grounds. You have bloody or black stools (feces) or stools that look like tar. You have a severe headache, a stiff neck, or both. You have severe pain, cramping, or bloating in your abdomen. You have difficulty breathing, or you are breathing very quickly. Your heart is beating very quickly. Your skin feels cold and clammy. You feel confused. You have signs of dehydration, such as: Dark urine, very little urine, or no urine. Cracked lips. Dry mouth. Sunken eyes. Sleepiness. Weakness. These symptoms may be an emergency. Get help right away. Call 911. Do not wait to see if the symptoms will go away. Do not drive yourself to the hospital. Summary Nausea is the feeling that you have an upset stomach or that you are about to vomit. As nausea gets worse, it can lead to vomiting. Vomiting can make you feel weak and cause you to become dehydrated. Follow instructions from your health care provider about eating and drinking to prevent dehydration. Take over-the-counter and prescription medicines only as told by your health care provider. Contact your health care   provider if your symptoms get worse, or you have new symptoms. Keep all follow-up visits. This is important. This information is not intended to replace advice given to you by your health care provider. Make sure you discuss any questions you have with your health care provider. Document Revised: 04/20/2021 Document Reviewed:  04/20/2021 Elsevier Patient Education  2024 [employer]  

## 2023-07-05 ENCOUNTER — Other Ambulatory Visit (HOSPITAL_COMMUNITY): Payer: Self-pay

## 2023-07-05 LAB — URINE CULTURE: Culture: NO GROWTH

## 2023-07-07 ENCOUNTER — Telehealth: Payer: Self-pay

## 2023-07-07 ENCOUNTER — Inpatient Hospital Stay: Payer: Medicare HMO

## 2023-07-07 ENCOUNTER — Other Ambulatory Visit (HOSPITAL_COMMUNITY): Payer: Self-pay

## 2023-07-07 ENCOUNTER — Inpatient Hospital Stay (HOSPITAL_BASED_OUTPATIENT_CLINIC_OR_DEPARTMENT_OTHER): Payer: Medicare HMO | Admitting: Physician Assistant

## 2023-07-07 ENCOUNTER — Other Ambulatory Visit: Payer: Self-pay

## 2023-07-07 VITALS — BP 147/66 | HR 73 | Temp 98.4°F | Resp 16 | Wt 173.4 lb

## 2023-07-07 DIAGNOSIS — R112 Nausea with vomiting, unspecified: Secondary | ICD-10-CM

## 2023-07-07 DIAGNOSIS — C55 Malignant neoplasm of uterus, part unspecified: Secondary | ICD-10-CM

## 2023-07-07 DIAGNOSIS — R109 Unspecified abdominal pain: Secondary | ICD-10-CM

## 2023-07-07 LAB — CMP (CANCER CENTER ONLY)
ALT: 8 U/L (ref 0–44)
AST: 12 U/L — ABNORMAL LOW (ref 15–41)
Albumin: 4.3 g/dL (ref 3.5–5.0)
Alkaline Phosphatase: 121 U/L (ref 38–126)
Anion gap: 10 (ref 5–15)
BUN: 23 mg/dL (ref 8–23)
CO2: 27 mmol/L (ref 22–32)
Calcium: 10.1 mg/dL (ref 8.9–10.3)
Chloride: 99 mmol/L (ref 98–111)
Creatinine: 0.9 mg/dL (ref 0.44–1.00)
GFR, Estimated: >60 mL/min (ref >60)
Glucose, Bld: 103 mg/dL — ABNORMAL HIGH (ref 70–99)
Potassium: 3.5 mmol/L (ref 3.5–5.1)
Sodium: 136 mmol/L (ref 135–145)
Total Bilirubin: 0.4 mg/dL (ref 0.3–1.2)
Total Protein: 7.6 g/dL (ref 6.5–8.1)

## 2023-07-07 LAB — CBC WITH DIFFERENTIAL (CANCER CENTER ONLY)
Abs Immature Granulocytes: 0.02 10*3/uL (ref 0.00–0.07)
Basophils Absolute: 0 10*3/uL (ref 0.0–0.1)
Basophils Relative: 0 %
Eosinophils Absolute: 0.1 10*3/uL (ref 0.0–0.5)
Eosinophils Relative: 1 %
HCT: 31.1 % — ABNORMAL LOW (ref 36.0–46.0)
Hemoglobin: 9.9 g/dL — ABNORMAL LOW (ref 12.0–15.0)
Immature Granulocytes: 0 %
Lymphocytes Relative: 17 %
Lymphs Abs: 1.4 10*3/uL (ref 0.7–4.0)
MCH: 28 pg (ref 26.0–34.0)
MCHC: 31.8 g/dL (ref 30.0–36.0)
MCV: 88.1 fL (ref 80.0–100.0)
Monocytes Absolute: 0.9 10*3/uL (ref 0.1–1.0)
Monocytes Relative: 11 %
Neutro Abs: 6.2 10*3/uL (ref 1.7–7.7)
Neutrophils Relative %: 71 %
Platelet Count: 359 10*3/uL (ref 150–400)
RBC: 3.53 MIL/uL — ABNORMAL LOW (ref 3.87–5.11)
RDW: 16 % — ABNORMAL HIGH (ref 11.5–15.5)
WBC Count: 8.6 10*3/uL (ref 4.0–10.5)
nRBC: 0 % (ref 0.0–0.2)

## 2023-07-07 LAB — MAGNESIUM: Magnesium: 1.5 mg/dL — ABNORMAL LOW (ref 1.7–2.4)

## 2023-07-07 MED ORDER — ONDANSETRON HCL 4 MG/2ML IJ SOLN
4.0000 mg | Freq: Once | INTRAMUSCULAR | Status: AC
  Administered 2023-07-07: 4 mg via INTRAVENOUS
  Filled 2023-07-07: qty 2

## 2023-07-07 MED ORDER — SODIUM CHLORIDE 0.9% FLUSH
10.0000 mL | Freq: Once | INTRAVENOUS | Status: AC
  Administered 2023-07-07: 10 mL

## 2023-07-07 MED ORDER — HEPARIN SOD (PORK) LOCK FLUSH 100 UNIT/ML IV SOLN
500.0000 [IU] | Freq: Once | INTRAVENOUS | Status: AC
  Administered 2023-07-07: 500 [IU]

## 2023-07-07 MED ORDER — MAGNESIUM SULFATE 2 GM/50ML IV SOLN
2.0000 g | Freq: Once | INTRAVENOUS | Status: AC
  Administered 2023-07-07: 2 g via INTRAVENOUS
  Filled 2023-07-07: qty 50

## 2023-07-07 MED ORDER — SODIUM CHLORIDE 0.9 % IV SOLN: Freq: Once | INTRAVENOUS | Status: AC

## 2023-07-07 MED ORDER — FAMOTIDINE IN NACL 20-0.9 MG/50ML-% IV SOLN
20.0000 mg | Freq: Once | INTRAVENOUS | Status: AC
  Administered 2023-07-07: 20 mg via INTRAVENOUS
  Filled 2023-07-07: qty 50

## 2023-07-07 MED ORDER — ONDANSETRON HCL 4 MG/2ML IJ SOLN: 4.0000 mg | Freq: Once | INTRAMUSCULAR | Status: DC

## 2023-07-07 MED ORDER — METHYLPREDNISOLONE 4 MG PO TBPK
ORAL_TABLET | ORAL | 0 refills | Status: DC
  Filled 2023-07-07: qty 21, 6d supply, fill #0

## 2023-07-07 MED ORDER — FAMOTIDINE IN NACL 20-0.9 MG/50ML-% IV SOLN: 20.0000 mg | Freq: Once | INTRAVENOUS | Status: DC

## 2023-07-07 NOTE — Telephone Encounter (Signed)
Called back and given appt today with [facility] with IV fluids. She is aware of appt time.

## 2023-07-07 NOTE — Patient Instructions (Signed)

## 2023-07-07 NOTE — Progress Notes (Signed)
Symptom Management Consult Note  [facility]    Patient Care Team: Sherri Aschoff, MD as PCP - General (Internal Medicine) Sherri Golden Lisette Abu, MD as PCP - Cardiology (Cardiology)    Name / MRN / DOB: Sherri Gill  [id number]  1951/03/22   Date of visit: 07/07/2023   Chief Complaint/Reason for visit: nausea and vomiting   Current Therapy: Jemperli  Last treatment:  Day 1   Cycle 7 on 06/20/23   ASSESSMENT & PLAN: Patient is a 72 y.o. female with oncologic history of uterine cancer followed by Dr. Bertis Gill.  I have viewed most recent oncology note and lab work.    #Uterine cancer - Next appointment with oncologist is 07/18/23  #Nausea and vomiting -No improvement since last clinic visit x 3 days ago after stopping Synthroid. Has been taking zofran and compazine consistently. -Patient well appearing HDS. Benign abdominal exam. -CBC showing stable anemia. CMP overall unremarkable. Magnesium slightly low at 1.5 so IV replacement administered in clinic. Patient also received 1L NS for hydration, IV zofran for nausea, and IV pepcid for GERD. On reassessment she is tolerating PO intake. -Lengthy discussion had with patient about symptom management. Low suspicion for infection without leukocytosis or fever. It is reassuring that LFTS and t. Bili are WNL. Symptoms are AE of Jemperli therefore will try medrol dosepak in attempt to manage them. Discussed CT AP as part of workup however patient prefers to try steroids first to avoid image as she had reassuring scan on 8/20. If symptoms do not improve would proceed with CT AP, patient agrees with plan.  #Constipation -Passing flatus. Exam reveals normoactive bowel sounds. -Patient taking sennakot. Encouraged to add Miralax as well and increase fluid intake at home.  Strict ED precautions discussed should symptoms worsen.  Heme/Onc History: Oncology History Overview Note  High grade serous, Her2/neu neg, MMR normal,  MSI stable   Uterine cancer (HCC)  10/12/2021 Imaging   US pelvis  1. Heterogeneous endometrial thickening with appearance of moderate complex fluid in the endometrial canal, possible hemorrhagic material. Endometrial thickness is considered abnormal for an asymptomatic post-menopausal female. Endometrial sampling should be considered to exclude carcinoma. 2. Nonvisualized ovary 3. Heterogeneous thick-walled urinary bladder, correlate for cystitis.   12/28/2021 Pathology Results   CYTOLOGY - NON PAP  CASE: [id number]  PATIENT: Sherri Gill  Non-Gynecological Cytology Report   Clinical History: Abnormal pelvic US, highly suspicious of malignant ascites  Specimen Submitted:  A. ASCITES, PARACENTESIS:    FINAL MICROSCOPIC DIAGNOSIS:  - Malignant cells consistent with adenocarcinoma  - See comment   SPECIMEN ADEQUACY:  Satisfactory for evaluation   DIAGNOSTIC COMMENTS:  Immunohistochemical stains show that the tumor cells are positive for CK7 and PAX8 while they are negative for CK20, CDX2 and ER, consistent with above interpretation.  Additionally, the tumor cells are diffusely positive for p53 (clonal overexpression), suggestive of a high-grade serous carcinoma.    12/28/2021 Imaging   US pelvis 1. Persistent abnormal appearance of the endometrium, heterogeneously thickened and complex with possible intraluminal fluid. This is similar to that seen on December 2022 ultrasound. Endometrial thickness is considered abnormal for an asymptomatic post-menopausal female. Endometrial sampling should be considered to exclude carcinoma. 2. Nonvisualization of the ovaries.   12/28/2021 Imaging   1. Large volume ascites. 2. Small umbilical hernia containing fat and ascites. 3. Haziness of anterior omentum most likely related to ascites, other etiologies such as metastatic disease can not be excluded. 4. Diffuse colonic diverticulosis  without evidence for diverticulitis.   12/29/2021 Procedure    Successful ultrasound-guided paracentesis yielding 2.8 liters of peritoneal fluid.   01/14/2022 Initial Diagnosis   Uterine cancer (HCC)   01/14/2022 Cancer Staging   Staging form: Corpus Uteri - Carcinoma and Carcinosarcoma, AJCC 8th Edition - Clinical: Stage III (cT3, cN0, cM0) - Signed by Artis Delay, MD on 01/14/2022 Stage prefix: Initial diagnosis   01/15/2022 Surgery   Surgery: Cervical dilation under ultrasound guidance, hysteroscopy, endometrial sampling using the Myosure   Surgeons:  Sherri Hock MD    Pathology: endometrial curetteings   Operative findings: On EUA, 8 cm mobile uterus, some nodularity along cul de sac. Cervix normal in appearance without discernible external os. Difficulty noted in dilating the cervix after using scalpel to incise in area of what what thought to be the external os. Under ultrasound guidance, cervix was dilated with confirmation on imaging of intra-uterine placement of dilators. On hysteroscopy, abnormal appearing endocervix and very calcified tissue obscuring good visualization of endometrium concerning for replacement by tumor.    01/15/2022 Pathology Results   FINAL MICROSCOPIC DIAGNOSIS:   A.   ENDOMETRIUM, CURETTAGE:  -    Serous endometrial carcinoma.   COMMENT:   The carcinoma is strongly and diffusely positive for p53 and diffuse p16 positivity.  The ER shows relatively diffuse positivity.  Overall, the histologic features (slit-like glandular spaces and psammoma bodies) and the immunohistochemical phenotype are most characteristic for a serous carcinoma.    01/21/2022 Procedure   Placement of single lumen port a cath via right internal jugular vein. The catheter tip lies at the cavo-atrial junction. A power injectable port a cath was placed and is ready for immediate use.     01/25/2022 - 06/21/2022 Chemotherapy   Patient is on Treatment Plan : UTERINE Carboplatin AUC 6 / Paclitaxel q21d     03/21/2022 Imaging   1. Near complete  resolution of previously noted ascites, with only small volume residual perihepatic ascites. 2. Improved omental caking and stranding in the ventral abdomen. 3. Findings are consistent with treatment response of peritoneal metastatic disease. 4. Thickening of the urinary bladder wall , consistent with nonspecific infectious or inflammatory cystitis. Correlate with urinalysis.   Aortic Atherosclerosis (ICD10-I70.0).       03/22/2022 Tumor Marker   Patient's tumor was tested for the following markers: CA-125. Results of the tumor marker test revealed 21.5.   04/09/2022 Surgery     Surgery: Diagnostic laparoscopy, conversion to exploratory laparotomy, partial omentectomy, lysis of adhesions for approximately 60 minutes, total abdominal hysterectomy, bilateral salpingo-oophorectomy, excision of ileal tumor implant and excision of transverse colon epiploica tumor implant   04/09/2022 Pathology Results   A.   OMENTUM, RESECTION:  -    Metastatic serous carcinoma.   B.   ILEAL TUMOR IMPLANTS, EXCISION:  -    Adhesions with small focus of metastatic serous carcinoma, possibly  intralymphatic.   C.   UTERUS, CERVIX, BILATERAL FALLOPIAN TUBES AND OVARIES:  -    Serous carcinoma, endometrial, invasive into outer half of uterus,  with involvement of  cervical stroma, uterine serosa, ovaries and fallopian tubes.   -    Negative for cervical dysplasia.  -    Leiomyomas, largest 1.2 cm in greatest dimension.   D.   COLON, MESENTARY NODULE, TRANSVERSE, EXCISION:  -    Metastatic serous carcinoma   05/09/2022 Tumor Marker   Patient's tumor was tested for the following markers: CA-125. Results of the tumor marker test revealed  12.1.   05/29/2022 Tumor Marker   Patient's tumor was tested for the following markers: CA-125. Results of the tumor marker test revealed 9.1.   07/23/2022 Imaging   1. No abdominopelvic lymphadenopathy.  2. Scattered foci of fat stranding in the anterior pelvic peritoneal  fat without discrete solid implants. Mild smooth wall thickening of the pelvic peritoneum. These findings are nonspecific and could represent postsurgical/post treatment change, although residual peritoneal carcinomatosis is on the differential and close follow-up CT is suggested. 3. Moderate left colonic diverticulosis. 4. Aortic Atherosclerosis (ICD10-I70.0).   07/23/2022 Tumor Marker   Patient's tumor was tested for the following markers: CA-125. Results of the tumor marker test revealed 5.2.   09/09/2022 Tumor Marker   Patient's tumor was tested for the following markers: CA-125. Results of the tumor marker test revealed 10.1.   01/28/2023 Imaging   OMENTAL AND PERITONEAL CARCINOMATOSIS.   POTENTIAL SOURCES ARE GASTRIC (MID AND DISTAL GASTRIC WALL THICKENING) OR GENITOURINARY (SOFT TISSUE MASS EXTENDS CEPHALAD FROM THE LEFT CERVICAL CUFF TOWARDS THE LEFT ADNEXA).   IF GASTRIC, THE PELVIC MASS WOULD REPRESENT AN OMENTAL DEPOSIT. PELVIC MASS OBSTRUCTING LEFT KIDNEY, MILD HYDROURETERONEPHROSIS.   MEDIASTINAL ADENOPATHY, LIKELY NODAL DISEASE.    02/03/2023 Tumor Marker   Patient's tumor was tested for the following markers: CA-125. Results of the tumor marker test revealed 429.   02/07/2023 -  Chemotherapy   Patient is on Treatment Plan : UTERINE ENDOMETRIAL Dostarlimab-gxly (500 mg) + Carboplatin (AUC 5) + Paclitaxel (175 mg/m2) q21d x 6 cycles / Dostarlimab-gxly (1000 mg) q42d x 6 cycles      03/03/2023 Tumor Marker   Patient's tumor was tested for the following markers: CA-125. Results of the tumor marker test revealed 176.   04/09/2023 Imaging   CT CHEST ABDOMEN PELVIS W CONTRAST  Result Date: 04/08/2023 CLINICAL DATA:  Endometrial cancer/uterine cancer. Evaluate treatment response to chemotherapy. * Tracking Code: BO * EXAM: CT CHEST, ABDOMEN, AND PELVIS WITH CONTRAST TECHNIQUE: Multidetector CT imaging of the chest, abdomen and pelvis was performed following the standard protocol  during bolus administration of intravenous contrast. RADIATION DOSE REDUCTION: This exam was performed according to the departmental dose-optimization program which includes automated exposure control, adjustment of the mA and/or kV according to patient size and/or use of iterative reconstruction technique. CONTRAST:  OMNIPAQUE IOHEXOL 300 MG/ML  SOLN COMPARISON:  Outside CTs of 01/28/2023. Report available in the Care everywhere epic system. FINDINGS: CT CHEST FINDINGS Cardiovascular: Right Port-A-Cath tip mid right atrium. Bovine arch. Aortic atherosclerosis. Mild cardiomegaly, without pericardial effusion. No central pulmonary embolism, on this non-dedicated study. Mediastinum/Nodes: No supraclavicular adenopathy. Right paratracheal node measures 7 mm and 16/2 versus 11 mm on the prior exam (when remeasured). No hilar adenopathy. Resolution of prevascular adenopathy with an index node measuring 5 mm on 18/2 versus up to 13 mm on the prior exam (when remeasured). Lungs/Pleura: No pleural fluid.  Clear lungs. Musculoskeletal: Although lack of sagittal reformats on the prior exam makes direct comparison challenging, there are sclerotic lesions within the T8 and T2 vertebral bodies on sagittal image 99 which are felt to be new since the prior. CT ABDOMEN PELVIS FINDINGS Hepatobiliary: No focal liver lesion. Normal gallbladder, without biliary ductal dilatation. Pancreas: Normal, without mass or ductal dilatation. Spleen: Normal in size, without focal abnormality. Adrenals/Urinary Tract: Normal adrenal glands. Normal kidneys, without hydronephrosis. The bladder is decompressed. Apparent wall thickening may be secondary. Stomach/Bowel: The gastric antrum is underdistended and appears thick walled including at 1.5 cm  on 59/2. Similar 1.6 cm on the prior. Colonic stool burden suggests constipation. Scattered colonic diverticula. Normal terminal ileum and appendix. Borderline prominent upper pelvic fluid-filled  small bowel loops of up to 2.5 cm. No proximal small bowel distension. Vascular/Lymphatic: Aortic atherosclerosis. No abdominal adenopathy. A left external iliac node measures 11 mm on 101/2 is similar to on the prior exam (when remeasured). Decreased size of tiny left obturator nodes including up to 4 mm today versus 9 mm on the prior. Reproductive: Hysterectomy. Other: Omental thickening/nodularity again identified. Example right abdomen at 9 mm on 74/2 versus 14 mm on the prior exam (when remeasured). Soft tissue fullness about the superior aspect of the left vaginal cuff is less well-defined today. Example at on the order of 3.3 x 2.4 cm on 97/2 versus 5.3 x 5.0 cm when remeasured in a similar fashion on the prior. Mild ventral/periumbilical hernia or laxity contains fat and peritoneal tumor, similar on 77/2. Musculoskeletal: No acute osseous abnormality. IMPRESSION: 1. Response to therapy since outside CTs of 01/28/2023. Improvement in abdominopelvic nodal/peritoneal metastasis. Resolution of thoracic adenopathy. 2. Developing sclerotic lesions within the thoracic spine which given response to therapy elsewhere likely represent treated previously CT occult metastasis. 3. Areas of borderline to mild small bowel dilatation within the upper pelvis could represent ileus or low-grade partial obstruction, given peritoneal disease. 4.  Aortic Atherosclerosis (ICD10-I70.0). 5. Similar gastric antral thickening in the setting of underdistention. Suspicious for gastritis. Electronically Signed   By: Jeronimo Greaves M.D.   On: 04/08/2023 16:29      04/14/2023 Tumor Marker   Patient's tumor was tested for the following markers: CA-125. Results of the tumor marker test revealed 100.   05/02/2023 Tumor Marker   Patient's tumor was tested for the following markers: CA-125. Results of the tumor marker test revealed 82.2.   06/02/2023 Tumor Marker   Patient's tumor was tested for the following markers: CA-125. Results of  the tumor marker test revealed 166.   06/17/2023 Imaging   CT CHEST ABDOMEN PELVIS W CONTRAST  Result Date: 06/17/2023 CLINICAL DATA:  History of endometrial cancer/uterine cancer. Evaluate treatment response to chemotherapy. * Tracking Code: BO *. EXAM: CT CHEST, ABDOMEN, AND PELVIS WITH CONTRAST TECHNIQUE: Multidetector CT imaging of the chest, abdomen and pelvis was performed following the standard protocol during bolus administration of intravenous contrast. RADIATION DOSE REDUCTION: This exam was performed according to the departmental dose-optimization program which includes automated exposure control, adjustment of the mA and/or kV according to patient size and/or use of iterative reconstruction technique. CONTRAST:  OMNIPAQUE IOHEXOL 300 MG/ML  SOLN COMPARISON:  Multiple priors including most recent CT April 08, 2023 FINDINGS: CT CHEST FINDINGS Cardiovascular: Right chest Port-A-Cath with tip in the right atrium. Aortic atherosclerosis. No central pulmonary embolus on this nondedicated study. Normal size heart. No significant pericardial effusion/thickening. Mediastinum/Nodes: No suspicious thyroid nodule. No pathologically enlarged mediastinal, hilar or axillary lymph nodes. Right paratracheal lymph node now measures 5 mm in short axis on image 17/2 previously 7 mm prevascular lymph node now measures 3 mm in short axis on image 20/2 previously 5 mm the esophagus is grossly unremarkable. Lungs/Pleura: No suspicious pulmonary nodules or masses. No pleural effusion. No pneumothorax. Musculoskeletal: Similar appearance of the sclerotic lesions in the T2 and T8 vertebral bodies. No new aggressive lytic or blastic lesion of bone. CT ABDOMEN PELVIS FINDINGS Hepatobiliary: No suspicious hepatic lesion. Gallbladder is unremarkable. No biliary ductal dilation. Pancreas: No pancreatic ductal dilation or evidence of acute  inflammation Spleen: No splenomegaly. Adrenals/Urinary Tract: Bilateral adrenal glands  appear normal. No hydronephrosis. Kidneys demonstrate symmetric enhancement. Urinary bladder is minimally distended limiting evaluation Stomach/Bowel: No radiopaque enteric contrast material was administered. Similar asymmetric thickening of the anterior gastric antrum measuring 15 mm on image 64/2, unchanged. No pathologic dilation of small or large bowel. Colonic diverticulosis without findings of acute diverticulitis. Vascular/Lymphatic: Aortic atherosclerosis. Normal caliber abdominal aorta. Smooth IVC contours. The portal, splenic and superior mesenteric veins are patent. Left external iliac lymph node measures 9 mm in short axis on image 102/2 previously 11 mm. -left obturator lymph node measures 2 mm in short axis on image 99/2 previously 4 mm. Reproductive: Uterus is surgically absent. Nodular soft tissue along the left side of the vaginal cuff now measures 2.1 x 1.8 cm on image 98/2 previously 3.3 x 2.4 cm. Other: Peritoneal/omental thickening and nodularity is decreased from prior. For instance in the central abdomen subjacent to the umbilicus nodular focus measures 10 mm on image 79/2 previously 2.2 cm. No significant abdominopelvic free fluid. Musculoskeletal: No new suspicious lytic or blastic lesion of bone. Multilevel degenerative changes spine. IMPRESSION: 1. Findings consistent with treatment response including decreased size of the nodular soft tissue along the left side of the vaginal cuff, decreased size of the peritoneal/omental thickening and nodularity, and decreased size of the mediastinal, left external iliac and left obturator lymph nodes. 2. Similar appearance of the sclerotic lesions in the T2 and T8 vertebral bodies. No new suspicious osseous lesions. 3. Similar asymmetric thickening of the anterior gastric antrum measuring 15 mm, unchanged. 4.  Aortic Atherosclerosis (ICD10-I70.0). Electronically Signed   By: Maudry Mayhew M.D.   On: 06/17/2023 14:46          Interval history-:  Sherri Gill is a 72 y.o. female with oncologic history as above presenting to [facility] today with chief complaint of nausea and vomiting.  Patient presents unaccompanied to clinic.  Patient reports since clinic visit x 2 days ago her symptoms have not improved.  She continues to have persistent nausea and vomiting.  She has been taking Compazine and Zofran consistently over the weekend.  Last dose of Compazine was at 830 this morning.  Her last episode of emesis was at 5 AM this morning.  She reports 1 episode of emesis daily.  She has had little to no p.o. intake to avoid additional episodes of emesis she tells me.  Patient states she is still having localized gnawing aching pain in her upper abdomen.  She now remembers this feels like when she had GERD in the past.  She does take Protonix daily and has been on this for a while.  She also will try Tums and Pepcid and symptoms are severe at home.  She took a Dilaudid around 8 AM this morning which helped her abdominal pain and she denies any currently.  She states she had endoscopy in her 60s and was told she had peptic ulcer disease.  Patient denies any fever or chills.  Denies any urinary symptoms.  She has not had a bowel movement in x 2 days.  She takes Senokot daily.  She does admit to passing flatus.  Denies longstanding history of constipation.      ROS  All other systems are reviewed and are negative for acute change except as noted in the HPI.    Allergies  Allergen Reactions   Codeine Sulfate Nausea And Vomiting and Other (See Comments)    Dizziness  Penicillins Nausea And Vomiting   Percocet [Oxycodone-Acetaminophen] Nausea And Vomiting   Zestril [Lisinopril] Cough     Past Medical History:  Diagnosis Date   AKI (acute kidney injury) (HCC)    History of   Ascites    09/2021 paracentesis with almost 3 L of fluid 12/2021 Paracentesis performed with 2.8 L   Carpal tunnel syndrome 02/11/2014   Chronic anemia 08/29/2012   Need  colonoscopy or report. Need iron panel.    Dyslipidemia 06/30/2007   Dyspnea    Essential hypertension, benign 06/30/2007   GERD 06/30/2007   History of blood transfusion    History of fatty infiltration of liver    History of migraine    Hx of Herpes simplex meningitis 2015   Also noted to have primary empty sella on imaging at this admission   Insomnia 06/11/2012   Major depressive disorder, recurrent episode, moderate with anxious distress (HCC) 06/30/2007   Nausea and vomiting 01/13/2022   Obesity, BMI 35-40 06/11/2012   Peripheral neuropathy 2/2 T2DM 12/28/2009   Primary empty sella syndrome (HCC) 2015   Noted on imaging during hospitalization for herpes meningitis; no pituitary mass, no hormone w/u at that time, hormonally asymptomatic   Type 2 diabetes mellitus with neurological complications (HCC) 06/30/2007     Past Surgical History:  Procedure Laterality Date   COLONOSCOPY W/ POLYPECTOMY  06/28/2004   DENTAL SURGERY     DILATATION & CURETTAGE/HYSTEROSCOPY WITH MYOSURE N/A 01/15/2022   Procedure: DILATATION & CURETTAGE/HYSTEROSCOPY WITH MYOSURE;  Surgeon: Carver Fila, MD;  Location: [facility];  Service: Gynecology;  Laterality: N/A;  DO NOT OPEN HYSTEROSCOPY KIT   HYSTERECTOMY ABDOMINAL WITH SALPINGO-OOPHORECTOMY Bilateral 04/09/2022   Procedure: HYSTERECTOMY ABDOMINAL BILATERAL SALPINGO OOPHORECTOMY WITH OMENTECTOMY ,DEBULKING;  Surgeon: Carver Fila, MD;  Location: [facility];  Service: Gynecology;  Laterality: Bilateral;   IR IMAGING GUIDED PORT INSERTION  01/18/2022   LAPAROSCOPY N/A 04/09/2022   Procedure: LAPAROSCOPY DIAGNOSTIC;  Surgeon: Carver Fila, MD;  Location: [facility];  Service: Gynecology;  Laterality: N/A;   OPERATIVE ULTRASOUND N/A 01/15/2022   Procedure: OPERATIVE ULTRASOUND;  Surgeon: Carver Fila, MD;  Location: [facility];  Service: Gynecology;  Laterality: N/A;   port a cath placement     TUBAL LIGATION     age 67    Social History    Socioeconomic History   Marital status: Single    Spouse name: Not on file   Number of children: Not on file   Years of education: 12   Highest education level: Not on file  Occupational History   Occupation: Airline pilot    Employer: [employer]  Tobacco Use   Smoking status: Former    Current packs/day: 0.00    Average packs/day: 0.3 packs/day for 10.0 years (2.5 ttl pk-yrs)    Types: Cigarettes    Start date: 10/28/1968    Quit date: 10/28/1978    Years since quitting: 44.7   Smokeless tobacco: Never  Vaping Use   Vaping status: Never Used  Substance and Sexual Activity   Alcohol use: No    Alcohol/week: 0.0 standard drinks of alcohol   Drug use: No   Sexual activity: Not Currently    Partners: Male    Birth control/protection: Post-menopausal  Other Topics Concern   Not on file  Social History Narrative   Not on file   Social Determinants of Health   Financial Resource Strain: Medium Risk (02/20/2023)   Overall Financial Resource Strain (CARDIA)  Difficulty of Paying Living Expenses: Somewhat hard  Food Insecurity: No Food Insecurity (04/21/2023)   Hunger Vital Sign    Worried About Running Out of Food in the Last Year: Never true    Ran Out of Food in the Last Year: Never true  Transportation Needs: No Transportation Needs (04/21/2023)   PRAPARE - Administrator, Civil Service (Medical): No    Lack of Transportation (Non-Medical): No  Physical Activity: Inactive (02/20/2023)   Exercise Vital Sign    Days of Exercise per Week: 0 days    Minutes of Exercise per Session: 0 min  Stress: Stress Concern Present (02/20/2023)   [employer] of Occupational Health - Occupational Stress Questionnaire    Feeling of Stress : To some extent  Social Connections: Unknown (02/20/2023)   Social Connection and Isolation Panel [NHANES]    Frequency of Communication with Friends and Family: More than three times a week    Frequency of Social Gatherings with Friends and Family:  Once a week    Attends Religious Services: Patient declined    Database administrator or Organizations: No    Attends Banker Meetings: Patient declined    Marital Status: Patient declined  Intimate Partner Violence: Not At Risk (02/20/2023)   Humiliation, Afraid, Rape, and Kick questionnaire    Fear of Current or Ex-Partner: No    Emotionally Abused: No    Physically Abused: No    Sexually Abused: No    Family History  Adopted: Yes  Problem Relation Age of Onset   Dementia Mother    Breast cancer Sister        paternal half-sister   Liver cancer Brother        maternal half-brother   Thyroid cancer Maternal Aunt    Prostate cancer Maternal Uncle    Prostate cancer Cousin    Breast cancer Other    Ovarian cancer Neg Hx    Colon cancer Neg Hx    Endometrial cancer Neg Hx    Pancreatic cancer Neg Hx      Current Outpatient Medications:    methylPREDNISolone (MEDROL DOSEPAK) 4 MG TBPK tablet, Take 6 pills by mouth day 1, 5 on day 2, 4 on day 3, 3 on day 4, 2 on day 5, 1 on day 6, Disp: 21 tablet, Rfl: 0   acetaminophen (TYLENOL) 500 MG tablet, Take 1,000 mg by mouth every 6 (six) hours as needed for mild pain., Disp: , Rfl:    albuterol (VENTOLIN HFA) 108 (90 Base) MCG/ACT inhaler, Inhale 2 puffs into the lungs every 4 (four) hours as needed for wheezing or shortness of breath., Disp: 8 g, Rfl: 0   atorvastatin (LIPITOR) 20 MG tablet, Take 1 tablet (20 mg total) by mouth at bedtime., Disp: 30 tablet, Rfl: 11   B Complex-C (B-COMPLEX WITH VITAMIN C) tablet, Take 1 tablet by mouth in the morning., Disp: , Rfl:    Blood Glucose Monitoring Suppl (ONE TOUCH ULTRA MINI) w/Device KIT, Please use as directed., Disp: 1 each, Rfl: 0   calcium carbonate (TUMS - DOSED IN MG ELEMENTAL CALCIUM) 500 MG chewable tablet, Chew 1 tablet by mouth 2 (two) times daily., Disp: , Rfl:    Cholecalciferol 1000 units capsule, Take 1 capsule (1,000 Units total) by mouth daily., Disp: 90 capsule,  Rfl: 1   diltiazem (CARDIZEM CD) 120 MG 24 hr capsule, Take 1 capsule (120 mg total) by mouth daily., Disp: 30 capsule, Rfl: 11  dorzolamide-timolol (COSOPT) 22.3-6.8 MG/ML ophthalmic solution, Place 1 drop into both eyes 2 (two) times daily., Disp: , Rfl:    glucose blood (ONE TOUCH ULTRA TEST) test strip, Use as instructed, Disp: 100 each, Rfl: 1   hydrALAZINE (APRESOLINE) 25 MG tablet, TAKE 1 TABLET BY MOUTH THREE TIMES DAILY, Disp: 90 tablet, Rfl: 3   HYDROmorphone (DILAUDID) 2 MG tablet, Take 1 tablet (2 mg) by mouth 3 times daily as needed for severe pain., Disp: 30 tablet, Rfl: 0   Insulin Pen Needle 32G X 4 MM MISC, 1 each by Does not apply route daily., Disp: 100 each, Rfl: 3   latanoprost (XALATAN) 0.005 % ophthalmic solution, Place 1 drop into both eyes at bedtime., Disp: , Rfl:    levothyroxine (SYNTHROID) 50 MCG tablet, Take 1 tablet (50 mcg total) by mouth daily before breakfast., Disp: 30 tablet, Rfl: 1   lidocaine-prilocaine (EMLA) cream, Apply to affected area once as directed, Disp: 30 g, Rfl: 3   losartan (COZAAR) 100 MG tablet, Take 1 tablet (100 mg total) by mouth at bedtime., Disp: 90 tablet, Rfl: 3   magnesium oxide (MAG-OX) 400 (240 Mg) MG tablet, Take 1 tablet (400 mg total) by mouth 2 (two) times daily., Disp: 60 tablet, Rfl: 1   metFORMIN (GLUCOPHAGE) 1000 MG tablet, Take 1 tablet (1,000 mg total) by mouth 2 (two) times daily., Disp: 180 tablet, Rfl: 3   ondansetron (ZOFRAN) 8 MG tablet, Take 1 tablet (8 mg total) by mouth every 8 (eight) hours as needed for nausea or vomiting. Start on the third day after chemotherapy., Disp: 30 tablet, Rfl: 1   ONETOUCH DELICA LANCETS 33G MISC, Please use as directed., Disp: 100 each, Rfl: 0   pantoprazole (PROTONIX) 40 MG tablet, Take 1 tablet by mouth daily., Disp: 360 tablet, Rfl: 0   polyethylene glycol powder (GLYCOLAX/MIRALAX) 17 GM/SCOOP powder, Take 17 g by mouth daily., Disp: 238 g, Rfl: 1   prochlorperazine (COMPAZINE) 10 MG  tablet, Take 1 tablet (10 mg total) by mouth every 6 (six) hours as needed for nausea or vomiting., Disp: 30 tablet, Rfl: 1   rivaroxaban (XARELTO) 20 MG TABS tablet, TAKE 1 TABLET BY MOUTH ONCE DAILY WITH SUPPER, Disp: 90 tablet, Rfl: 3  Current Facility-Administered Medications:    famotidine (PEPCID) IVPB 20 mg premix, 20 mg, Intravenous, Once, Walisiewicz, Maram Bently E, PA-C   ondansetron (ZOFRAN) injection 4 mg, 4 mg, Intravenous, Once, Walisiewicz, Ansley Stanwood E, PA-C  PHYSICAL EXAM: ECOG FS:1 - Symptomatic but completely ambulatory    Vitals:   07/07/23 1049 07/07/23 1304  BP: 134/71 (!) 147/66  Pulse: 85 73  Resp: 18 16  Temp: 98.4 F (36.9 C)   TempSrc: Oral   SpO2: 99% 99%  Weight: 173 lb 6.4 oz (78.7 kg)    Physical Exam Vitals and nursing note reviewed.  Constitutional:      Appearance: She is not ill-appearing or toxic-appearing.  HENT:     Head: Normocephalic.     Mouth/Throat:     Mouth: Mucous membranes are dry.  Eyes:     Conjunctiva/sclera: Conjunctivae normal.  Cardiovascular:     Rate and Rhythm: Normal rate and regular rhythm.     Pulses: Normal pulses.     Heart sounds: Normal heart sounds.  Pulmonary:     Effort: Pulmonary effort is normal.     Breath sounds: Normal breath sounds.  Abdominal:     General: Bowel sounds are normal. There is no distension.  Palpations: Abdomen is soft. There is no mass.     Tenderness: There is no abdominal tenderness. There is no guarding or rebound.     Hernia: No hernia is present.  Musculoskeletal:     Cervical back: Normal range of motion.     Right lower leg: No edema.     Left lower leg: No edema.  Skin:    General: Skin is warm and dry.  Neurological:     Mental Status: She is alert.        LABORATORY DATA: I have reviewed the data as listed    Latest Ref Rng & Units 07/07/2023   10:49 AM 07/04/2023    9:56 AM 06/20/2023    8:44 AM  CBC  WBC 4.0 - 10.5 K/uL 8.6  5.9  6.0   Hemoglobin 12.0 - 15.0  g/dL 9.9  8.7  8.4   Hematocrit 36.0 - 46.0 % 31.1  27.0  26.6   Platelets 150 - 400 K/uL 359  244  250         Latest Ref Rng & Units 07/07/2023   10:49 AM 07/04/2023    9:56 AM 06/20/2023    8:44 AM  CMP  Glucose 70 - 99 mg/dL 161  096  045   BUN 8 - 23 mg/dL 23  13  15    Creatinine 0.44 - 1.00 mg/dL 4.09  8.11  9.14   Sodium 135 - 145 mmol/L 136  136  139   Potassium 3.5 - 5.1 mmol/L 3.5  3.4  4.0   Chloride 98 - 111 mmol/L 99  103  105   CO2 22 - 32 mmol/L 27  28  26    Calcium 8.9 - 10.3 mg/dL 78.2  9.2  9.2   Total Protein 6.5 - 8.1 g/dL 7.6  6.6  6.9   Total Bilirubin 0.3 - 1.2 mg/dL 0.4  0.3  0.3   Alkaline Phos 38 - 126 U/L 121  115  139   AST 15 - 41 U/L 12  9  9    ALT 0 - 44 U/L 8  5  6         RADIOGRAPHIC STUDIES (from last 24 hours if applicable) I have personally reviewed the radiological images as listed and agreed with the findings in the report. No results found.      Visit Diagnosis: 1. Abdominal pain, unspecified abdominal location   2. Malignant neoplasm of uterus, unspecified site (HCC)   3. Nausea and vomiting, unspecified vomiting type   4. Hypomagnesemia      No orders of the defined types were placed in this encounter.   All questions were answered. The patient knows to call the clinic with any problems, questions or concerns. No barriers to learning was detected.  A total of more than 30 minutes were spent on this encounter with face-to-face time and non-face-to-face time, including preparing to see the patient, ordering tests and/or medications, counseling the patient and coordination of care as outlined above.    Thank you for allowing me to participate in the care of this patient.    Shanon Ace, PA-C Department of Hematology/Oncology [facility] at [facility] Phone: [phone]  [phone]    07/07/2023 1:48 PM

## 2023-07-07 NOTE — Telephone Encounter (Signed)
Returned her call. She left x 2 messages. She started vomiting again yesterday. She is unable to eat due to nausea/ vomiting. She is only taking Zofran 1/2 tab 4 mg for nausea. Instructed to take Zofran 8 mg q 8 hours and compazine q 6 hours, alternate medications. She verbalized understanding. She is complaining of not feeling well and she has been sick for 9 days. Instructed her to go the ER to be evaluated, she does not want to got the ER and is requesting to come to [facility] today again and IV fluids.  Told her that I will call her back after checking with smc.

## 2023-07-09 ENCOUNTER — Telehealth: Payer: Self-pay

## 2023-07-09 ENCOUNTER — Other Ambulatory Visit (HOSPITAL_COMMUNITY): Payer: Self-pay

## 2023-07-09 ENCOUNTER — Telehealth: Payer: Self-pay | Admitting: *Deleted

## 2023-07-09 ENCOUNTER — Telehealth: Payer: Self-pay | Admitting: Hematology and Oncology

## 2023-07-09 NOTE — Telephone Encounter (Signed)
Patient called she stated she is having new concerns and has been home vomiting for about 9 days.patient is requesting a referral to GI, patient is requesting a call back.

## 2023-07-09 NOTE — Telephone Encounter (Signed)
RTC to patient states she has been vomiting every day since 06/27/2023.  States has completed her Cancer Treatment.  Has been going to the [facility] for fluids due to the vomiting. Was started  on a medication that she began on yesterday.  With her Cancer treatments she was receiving Immunotherapy as well.  Was told that this could cause some GI problems.  Has been belching and vomiting yellow bili.  Has a bad smell.  Was also put on Levothyroxine which caused her stomach upset.  Was then taken off of the Thyroid  medication .  Has lost 11 lbs in the last few days.  Is having upper chest discomfort by her breast bone which makes her think it is GI related.   Was started on a medication for her stomach by PA Jae Dire to see if this would solve the issue if not will then think about referral.    Does not want to wait until things get worse before she can finally get a referral.  Is very frustrated and feels that no one understands.   Family member advised her to contact  her PCP.  Patient also stated that she had had stomach issues in the past and does not want this to turn into Cancer.  Patient was informed that Dr. Mayford Knife will probably want to see her before doing any referral as this is a new issue.  Patient is ok with coming for an appointment before this gets any worse.  Will forward information to Dr. Mayford Knife.

## 2023-07-09 NOTE — Telephone Encounter (Signed)
TC from Pt stating she is still vomiting. She can eat but food will come back up hours later. She is sick and wants to get to the bottom of what's happening. Spoke with Pt discussed her taking the steroid that was prescribed for her. Discussed her diet while feeling nauseous informed her to stay away from dairy and she should try bland foods discussed the BRAT diet and Gatorade. Pt inquired about resuming taking her synthroid. Discussed with Domingo Sep PA who saw her on 07/07/23 who stated she can resume taking her synthroid. Pt stated she took the steroids and she is ok for right now. Informed her to return call to the office if anything should change. Pt verbalized understanding. Was grateful for the return call today.

## 2023-07-10 ENCOUNTER — Ambulatory Visit (HOSPITAL_COMMUNITY)
Admission: RE | Admit: 2023-07-10 | Discharge: 2023-07-10 | Disposition: A | Discharge status: Home / Self Care | Payer: Medicare HMO | Source: Ambulatory Visit | Attending: Hematology and Oncology | Admitting: Hematology and Oncology

## 2023-07-10 ENCOUNTER — Inpatient Hospital Stay: Payer: Medicare HMO

## 2023-07-10 ENCOUNTER — Inpatient Hospital Stay (HOSPITAL_BASED_OUTPATIENT_CLINIC_OR_DEPARTMENT_OTHER): Payer: Medicare HMO | Admitting: Hematology and Oncology

## 2023-07-10 ENCOUNTER — Other Ambulatory Visit: Payer: Self-pay | Admitting: Hematology and Oncology

## 2023-07-10 ENCOUNTER — Other Ambulatory Visit: Payer: Self-pay | Admitting: Internal Medicine

## 2023-07-10 ENCOUNTER — Telehealth: Payer: Self-pay

## 2023-07-10 ENCOUNTER — Encounter: Payer: Self-pay | Admitting: Hematology and Oncology

## 2023-07-10 VITALS — BP 140/71 | HR 84 | Temp 98.3°F | Resp 18 | Ht 63.0 in | Wt 170.0 lb

## 2023-07-10 VITALS — BP 140/72 | HR 78 | Temp 98.2°F | Resp 18

## 2023-07-10 DIAGNOSIS — D508 Other iron deficiency anemias: Secondary | ICD-10-CM

## 2023-07-10 DIAGNOSIS — R110 Nausea: Secondary | ICD-10-CM | POA: Diagnosis not present

## 2023-07-10 DIAGNOSIS — C55 Malignant neoplasm of uterus, part unspecified: Secondary | ICD-10-CM

## 2023-07-10 DIAGNOSIS — K5909 Other constipation: Secondary | ICD-10-CM | POA: Diagnosis not present

## 2023-07-10 DIAGNOSIS — R1013 Epigastric pain: Secondary | ICD-10-CM

## 2023-07-10 DIAGNOSIS — R1110 Vomiting, unspecified: Secondary | ICD-10-CM | POA: Diagnosis not present

## 2023-07-10 DIAGNOSIS — R109 Unspecified abdominal pain: Secondary | ICD-10-CM | POA: Diagnosis not present

## 2023-07-10 DIAGNOSIS — R112 Nausea with vomiting, unspecified: Secondary | ICD-10-CM

## 2023-07-10 DIAGNOSIS — T451X5A Adverse effect of antineoplastic and immunosuppressive drugs, initial encounter: Secondary | ICD-10-CM

## 2023-07-10 LAB — URINALYSIS, COMPLETE (UACMP) WITH MICROSCOPIC
Bacteria, UA: NONE SEEN
Bilirubin Urine: NEGATIVE
Glucose, UA: NEGATIVE mg/dL
Hgb urine dipstick: NEGATIVE
Ketones, ur: 20 mg/dL — AB
Nitrite: NEGATIVE
Protein, ur: 30 mg/dL — AB
Specific Gravity, Urine: 1.021 (ref 1.005–1.030)
pH: 5 (ref 5.0–8.0)

## 2023-07-10 LAB — CMP (CANCER CENTER ONLY)
ALT: 12 U/L (ref 0–44)
AST: 12 U/L — ABNORMAL LOW (ref 15–41)
Albumin: 4.3 g/dL (ref 3.5–5.0)
Alkaline Phosphatase: 128 U/L — ABNORMAL HIGH (ref 38–126)
Anion gap: 10 (ref 5–15)
BUN: 20 mg/dL (ref 8–23)
CO2: 28 mmol/L (ref 22–32)
Calcium: 10.1 mg/dL (ref 8.9–10.3)
Chloride: 96 mmol/L — ABNORMAL LOW (ref 98–111)
Creatinine: 0.87 mg/dL (ref 0.44–1.00)
GFR, Estimated: >60 mL/min (ref >60)
Glucose, Bld: 105 mg/dL — ABNORMAL HIGH (ref 70–99)
Potassium: 3.8 mmol/L (ref 3.5–5.1)
Sodium: 134 mmol/L — ABNORMAL LOW (ref 135–145)
Total Bilirubin: 0.3 mg/dL (ref 0.3–1.2)
Total Protein: 7.6 g/dL (ref 6.5–8.1)

## 2023-07-10 LAB — CBC WITH DIFFERENTIAL (CANCER CENTER ONLY)
Abs Immature Granulocytes: 0.04 10*3/uL (ref 0.00–0.07)
Basophils Absolute: 0 10*3/uL (ref 0.0–0.1)
Basophils Relative: 0 %
Eosinophils Absolute: 0 10*3/uL (ref 0.0–0.5)
Eosinophils Relative: 0 %
HCT: 31.5 % — ABNORMAL LOW (ref 36.0–46.0)
Hemoglobin: 10.1 g/dL — ABNORMAL LOW (ref 12.0–15.0)
Immature Granulocytes: 0 %
Lymphocytes Relative: 18 %
Lymphs Abs: 1.8 10*3/uL (ref 0.7–4.0)
MCH: 27.7 pg (ref 26.0–34.0)
MCHC: 32.1 g/dL (ref 30.0–36.0)
MCV: 86.5 fL (ref 80.0–100.0)
Monocytes Absolute: 1.1 10*3/uL — ABNORMAL HIGH (ref 0.1–1.0)
Monocytes Relative: 11 %
Neutro Abs: 7.1 10*3/uL (ref 1.7–7.7)
Neutrophils Relative %: 71 %
Platelet Count: 384 10*3/uL (ref 150–400)
RBC: 3.64 MIL/uL — ABNORMAL LOW (ref 3.87–5.11)
RDW: 15.7 % — ABNORMAL HIGH (ref 11.5–15.5)
WBC Count: 10.1 10*3/uL (ref 4.0–10.5)
nRBC: 0 % (ref 0.0–0.2)

## 2023-07-10 LAB — SAMPLE TO BLOOD BANK

## 2023-07-10 MED ORDER — SODIUM CHLORIDE 0.9 % IV SOLN: INTRAVENOUS | Status: DC

## 2023-07-10 MED ORDER — SODIUM CHLORIDE 0.9% FLUSH
10.0000 mL | Freq: Once | INTRAVENOUS | Status: AC
  Administered 2023-07-10: 10 mL

## 2023-07-10 MED ORDER — PROCHLORPERAZINE EDISYLATE 10 MG/2ML IJ SOLN
10.0000 mg | Freq: Once | INTRAMUSCULAR | Status: AC
  Administered 2023-07-10: 10 mg via INTRAVENOUS

## 2023-07-10 NOTE — Telephone Encounter (Signed)
Called her and offered appts today for complaint of vomiting and not feeling well. Instructed to go to radiology at 1230 at [facility] for xray of abdomen. Then come to [facility] at 1:30 pm for port/lab flush and see Dr. [name] at 2 pm. She agreed to appts and is aware of appt date/time. She is requesting IV fluids today after seeing Dr. [name]. Sent a message to charge nurse to see if IV fluids can be added on.

## 2023-07-10 NOTE — Assessment & Plan Note (Signed)
She has chronic constipation, exacerbated by recent Zofran I recommend the patient to increase MiraLAX to twice daily and to continue Senokot

## 2023-07-10 NOTE — Telephone Encounter (Signed)
Call to patient to inform her that Dr. Mayford Knife has ordered the GI referral.  No answer and unable to leave a message.

## 2023-07-10 NOTE — Progress Notes (Signed)
[facility] OFFICE PROGRESS NOTE  Patient Care Team: [name], MD as PCP - General (Internal Medicine) Rennis Golden Lisette Abu, MD as PCP - Cardiology (Cardiology)  ASSESSMENT & PLAN:  Uterine cancer [facility]) She has significant abdominal symptoms with bloating, constipation and nausea and poor oral intake of unknown etiology I have reviewed her abdominal x-ray independently There is no signs of bowel obstruction but I suspect she is constipated I will order urinalysis and urine culture to rule out urinary tract infection if the tests are unremarkable, I will repeat CT imaging to determine the cause of her abdominal symptoms I reiterated to the patient that her symptom is not caused by immunotherapy  Chronic nausea She has chronic nausea over the past 4 months This is despite excellent response to treatment She is taking antiemetics as needed We will prescribe IV antiemetics and IV fluids today I will order urinalysis and urine culture to rule out infection If test come back unremarkable, I will order repeat CT imaging  Other constipation She has chronic constipation, exacerbated by recent Zofran I recommend the patient to increase MiraLAX to twice daily and to continue Senokot  No orders of the defined types were placed in this encounter.   All questions were answered. The patient knows to call the clinic with any problems, questions or concerns. The total time spent in the appointment was 40 minutes encounter with patients including review of chart and various tests results, discussions about plan of care and coordination of care plan   Artis Delay, MD 07/10/2023 3:21 PM  INTERVAL HISTORY: Please see below for problem oriented charting. she returns for urgent evaluation Over the past 2 weeks, she has very poor oral intake She had constant nausea and occasional vomiting She has intermittent need for IV fluids She is also constipated and straining to have bowel  movement This is despite taking MiraLAX and Senokot every day She also have intermittent abdominal pain She is wondering whether her symptoms can be caused by her immunotherapy  REVIEW OF SYSTEMS:   Constitutional: Denies fevers, chills or abnormal weight loss Eyes: Denies blurriness of vision Ears, nose, mouth, throat, and face: Denies mucositis or sore throat Respiratory: Denies cough, dyspnea or wheezes Cardiovascular: Denies palpitation, chest discomfort or lower extremity swelling Skin: Denies abnormal skin rashes Lymphatics: Denies new lymphadenopathy or easy bruising Behavioral/Psych: Mood is stable, no new changes  All other systems were reviewed with the patient and are negative.  I have reviewed the past medical history, past surgical history, social history and family history with the patient and they are unchanged from previous note.  ALLERGIES:  is allergic to codeine sulfate, penicillins, percocet [oxycodone-acetaminophen], and zestril [lisinopril].  MEDICATIONS:  Current Outpatient Medications  Medication Sig Dispense Refill   acetaminophen (TYLENOL) 500 MG tablet Take 1,000 mg by mouth every 6 (six) hours as needed for mild pain.     albuterol (VENTOLIN HFA) 108 (90 Base) MCG/ACT inhaler Inhale 2 puffs into the lungs every 4 (four) hours as needed for wheezing or shortness of breath. 8 g 0   atorvastatin (LIPITOR) 20 MG tablet Take 1 tablet (20 mg total) by mouth at bedtime. 30 tablet 11   B Complex-C (B-COMPLEX WITH VITAMIN C) tablet Take 1 tablet by mouth in the morning.     Blood Glucose Monitoring Suppl (ONE TOUCH ULTRA MINI) w/Device KIT Please use as directed. 1 each 0   calcium carbonate (TUMS - DOSED IN MG ELEMENTAL CALCIUM) 500 MG  chewable tablet Chew 1 tablet by mouth 2 (two) times daily.     Cholecalciferol 1000 units capsule Take 1 capsule (1,000 Units total) by mouth daily. 90 capsule 1   diltiazem (CARDIZEM CD) 120 MG 24 hr capsule Take 1 capsule (120 mg  total) by mouth daily. 30 capsule 11   dorzolamide-timolol (COSOPT) 22.3-6.8 MG/ML ophthalmic solution Place 1 drop into both eyes 2 (two) times daily.     glucose blood (ONE TOUCH ULTRA TEST) test strip Use as instructed 100 each 1   hydrALAZINE (APRESOLINE) 25 MG tablet TAKE 1 TABLET BY MOUTH THREE TIMES DAILY 90 tablet 3   HYDROmorphone (DILAUDID) 2 MG tablet Take 1 tablet (2 mg) by mouth 3 times daily as needed for severe pain. 30 tablet 0   Insulin Pen Needle 32G X 4 MM MISC 1 each by Does not apply route daily. 100 each 3   latanoprost (XALATAN) 0.005 % ophthalmic solution Place 1 drop into both eyes at bedtime.     levothyroxine (SYNTHROID) 50 MCG tablet Take 1 tablet (50 mcg total) by mouth daily before breakfast. 30 tablet 1   lidocaine-prilocaine (EMLA) cream Apply to affected area once as directed 30 g 3   losartan (COZAAR) 100 MG tablet Take 1 tablet (100 mg total) by mouth at bedtime. 90 tablet 3   magnesium oxide (MAG-OX) 400 (240 Mg) MG tablet Take 1 tablet (400 mg total) by mouth 2 (two) times daily. 60 tablet 1   metFORMIN (GLUCOPHAGE) 1000 MG tablet Take 1 tablet (1,000 mg total) by mouth 2 (two) times daily. 180 tablet 3   methylPREDNISolone (MEDROL DOSEPAK) 4 MG TBPK tablet Take 6 tablets by mouth on day 1 then decrease by 1 tablet daily until finished (6-5-4-3-2-1) 21 tablet 0   ondansetron (ZOFRAN) 8 MG tablet Take 1 tablet (8 mg total) by mouth every 8 (eight) hours as needed for nausea or vomiting. Start on the third day after chemotherapy. 30 tablet 1   ONETOUCH DELICA LANCETS 33G MISC Please use as directed. 100 each 0   pantoprazole (PROTONIX) 40 MG tablet Take 1 tablet by mouth daily. 360 tablet 0   polyethylene glycol powder (GLYCOLAX/MIRALAX) 17 GM/SCOOP powder Take 17 g by mouth daily. 238 g 1   prochlorperazine (COMPAZINE) 10 MG tablet Take 1 tablet (10 mg total) by mouth every 6 (six) hours as needed for nausea or vomiting. 30 tablet 1   rivaroxaban (XARELTO) 20 MG  TABS tablet TAKE 1 TABLET BY MOUTH ONCE DAILY WITH SUPPER 90 tablet 3   No current facility-administered medications for this visit.   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 Heterogeneous endometrial thickening with appearance of moderate complex fluid in the endometrial canal, possible hemorrhagic material. Endometrial thickness is considered abnormal for an asymptomatic post-menopausal female. Endometrial sampling should be considered to exclude carcinoma. 2. Nonvisualized ovary 3. Heterogeneous thick-walled urinary bladder, correlate for cystitis.   12/28/2021 Pathology Results   CYTOLOGY - NON PAP  CASE: [id number]  PATIENT: Sherri Gill  Non-Gynecological Cytology Report   Clinical History: Abnormal pelvic US, highly suspicious of  malignant ascites  Specimen Submitted:  A. ASCITES, PARACENTESIS:    FINAL MICROSCOPIC DIAGNOSIS:  - Malignant cells consistent with adenocarcinoma  - See comment   SPECIMEN ADEQUACY:  Satisfactory for evaluation   DIAGNOSTIC COMMENTS:  Immunohistochemical stains show that the tumor cells are positive for CK7 and PAX8 while they are negative for CK20, CDX2 and ER, consistent with above interpretation.  Additionally, the tumor cells are diffusely positive for p53 (clonal overexpression), suggestive of a high-grade serous carcinoma.    12/28/2021 Imaging   US pelvis 1. Persistent abnormal appearance of the endometrium, heterogeneously thickened and complex with possible intraluminal fluid. This is similar to that seen on December 2022 ultrasound. Endometrial  thickness is considered abnormal for an asymptomatic post-menopausal female. Endometrial sampling should be considered to exclude carcinoma. 2. Nonvisualization of the ovaries.   12/28/2021 Imaging   1. Large volume ascites. 2. Small umbilical hernia containing fat and ascites. 3. Haziness of anterior omentum most likely related to ascites, other etiologies such as metastatic disease can not be excluded. 4. Diffuse colonic diverticulosis without evidence for diverticulitis.   12/29/2021 Procedure   Successful ultrasound-guided paracentesis yielding 2.8 liters of peritoneal fluid.   01/14/2022 Initial Diagnosis   Uterine cancer (HCC)   01/14/2022 Cancer Staging   Staging form: Corpus Uteri - Carcinoma and Carcinosarcoma, AJCC 8th Edition - Clinical: Stage III (cT3, cN0, cM0) - Signed by Artis Delay, MD on 01/14/2022 Stage prefix: Initial diagnosis   01/15/2022 Surgery   Surgery: Cervical dilation under ultrasound guidance, hysteroscopy, endometrial sampling using the Myosure   Surgeons:  Carin Hock MD    Pathology: endometrial curetteings   Operative findings: On EUA, 8 cm mobile uterus, some nodularity along cul de sac. Cervix normal in appearance without discernible external os. Difficulty noted in dilating the cervix after using scalpel to incise in area of what what thought to be the external os. Under ultrasound guidance, cervix was dilated with confirmation on imaging of intra-uterine placement of dilators. On hysteroscopy, abnormal appearing endocervix and very calcified tissue obscuring good visualization of endometrium concerning for replacement by tumor.    01/15/2022 Pathology Results   FINAL MICROSCOPIC DIAGNOSIS:   A.   ENDOMETRIUM, CURETTAGE:  -    Serous endometrial carcinoma.   COMMENT:   The carcinoma is strongly and diffusely positive for p53 and diffuse p16 positivity.  The ER shows relatively diffuse positivity.  Overall, the histologic features (slit-like glandular  spaces and psammoma bodies) and the immunohistochemical phenotype are most characteristic for a serous carcinoma.    01/21/2022 Procedure   Placement of single lumen port a cath via right internal jugular vein. The catheter tip lies at the cavo-atrial junction. A power injectable port a cath was placed and is ready for immediate use.     01/25/2022 - 06/21/2022 Chemotherapy   Patient is on Treatment Plan : UTERINE Carboplatin AUC 6 / Paclitaxel q21d     03/21/2022 Imaging   1. Near complete resolution of previously noted ascites, with only small volume residual perihepatic ascites. 2. Improved omental caking and stranding in the ventral abdomen. 3. Findings are consistent with treatment response of peritoneal metastatic disease. 4. Thickening of the urinary bladder wall , consistent with nonspecific infectious or inflammatory cystitis. Correlate with urinalysis.   Aortic Atherosclerosis (ICD10-I70.0).       03/22/2022 Tumor Marker   Patient's tumor was tested for the following markers: CA-125. Results of the tumor marker test revealed 21.5.   04/09/2022 Surgery  Surgery: Diagnostic laparoscopy, conversion to exploratory laparotomy, partial omentectomy, lysis of adhesions for approximately 60 minutes, total abdominal hysterectomy, bilateral salpingo-oophorectomy, excision of ileal tumor implant and excision of transverse colon epiploica tumor implant   04/09/2022 Pathology Results   A.   OMENTUM, RESECTION:  -    Metastatic serous carcinoma.   B.   ILEAL TUMOR IMPLANTS, EXCISION:  -    Adhesions with small focus of metastatic serous carcinoma, possibly  intralymphatic.   C.   UTERUS, CERVIX, BILATERAL FALLOPIAN TUBES AND OVARIES:  -    Serous carcinoma, endometrial, invasive into outer half of uterus,  with involvement of  cervical stroma, uterine serosa, ovaries and fallopian tubes.   -    Negative for cervical dysplasia.  -    Leiomyomas, largest 1.2 cm in greatest dimension.    D.   COLON, MESENTARY NODULE, TRANSVERSE, EXCISION:  -    Metastatic serous carcinoma   05/09/2022 Tumor Marker   Patient's tumor was tested for the following markers: CA-125. Results of the tumor marker test revealed 12.1.   05/29/2022 Tumor Marker   Patient's tumor was tested for the following markers: CA-125. Results of the tumor marker test revealed 9.1.   07/23/2022 Imaging   1. No abdominopelvic lymphadenopathy.  2. Scattered foci of fat stranding in the anterior pelvic peritoneal fat without discrete solid implants. Mild smooth wall thickening of the pelvic peritoneum. These findings are nonspecific and could represent postsurgical/post treatment change, although residual peritoneal carcinomatosis is on the differential and close follow-up CT is suggested. 3. Moderate left colonic diverticulosis. 4. Aortic Atherosclerosis (ICD10-I70.0).   07/23/2022 Tumor Marker   Patient's tumor was tested for the following markers: CA-125. Results of the tumor marker test revealed 5.2.   09/09/2022 Tumor Marker   Patient's tumor was tested for the following markers: CA-125. Results of the tumor marker test revealed 10.1.   01/28/2023 Imaging   OMENTAL AND PERITONEAL CARCINOMATOSIS.   POTENTIAL SOURCES ARE GASTRIC (MID AND DISTAL GASTRIC WALL THICKENING) OR GENITOURINARY (SOFT TISSUE MASS EXTENDS CEPHALAD FROM THE LEFT CERVICAL CUFF TOWARDS THE LEFT ADNEXA).   IF GASTRIC, THE PELVIC MASS WOULD REPRESENT AN OMENTAL DEPOSIT. PELVIC MASS OBSTRUCTING LEFT KIDNEY, MILD HYDROURETERONEPHROSIS.   MEDIASTINAL ADENOPATHY, LIKELY NODAL DISEASE.    02/03/2023 Tumor Marker   Patient's tumor was tested for the following markers: CA-125. Results of the tumor marker test revealed 429.   02/07/2023 -  Chemotherapy   Patient is on Treatment Plan : UTERINE ENDOMETRIAL Dostarlimab-gxly (500 mg) + Carboplatin (AUC 5) + Paclitaxel (175 mg/m2) q21d x 6 cycles / Dostarlimab-gxly (1000 mg) q42d x 6 cycles       03/03/2023 Tumor Marker   Patient's tumor was tested for the following markers: CA-125. Results of the tumor marker test revealed 176.   04/09/2023 Imaging   CT CHEST ABDOMEN PELVIS W CONTRAST  Result Date: 04/08/2023 CLINICAL DATA:  Endometrial cancer/uterine cancer. Evaluate treatment response to chemotherapy. * Tracking Code: BO * EXAM: CT CHEST, ABDOMEN, AND PELVIS WITH CONTRAST TECHNIQUE: Multidetector CT imaging of the chest, abdomen and pelvis was performed following the standard protocol during bolus administration of intravenous contrast. RADIATION DOSE REDUCTION: This exam was performed according to the departmental dose-optimization program which includes automated exposure control, adjustment of the mA and/or kV according to patient size and/or use of iterative reconstruction technique. CONTRAST:  OMNIPAQUE IOHEXOL 300 MG/ML  SOLN COMPARISON:  Outside CTs of 01/28/2023. Report available in the Care everywhere epic system. FINDINGS: CT  CHEST FINDINGS Cardiovascular: Right Port-A-Cath tip mid right atrium. Bovine arch. Aortic atherosclerosis. Mild cardiomegaly, without pericardial effusion. No central pulmonary embolism, on this non-dedicated study. Mediastinum/Nodes: No supraclavicular adenopathy. Right paratracheal node measures 7 mm and 16/2 versus 11 mm on the prior exam (when remeasured). No hilar adenopathy. Resolution of prevascular adenopathy with an index node measuring 5 mm on 18/2 versus up to 13 mm on the prior exam (when remeasured). Lungs/Pleura: No pleural fluid.  Clear lungs. Musculoskeletal: Although lack of sagittal reformats on the prior exam makes direct comparison challenging, there are sclerotic lesions within the T8 and T2 vertebral bodies on sagittal image 99 which are felt to be new since the prior. CT ABDOMEN PELVIS FINDINGS Hepatobiliary: No focal liver lesion. Normal gallbladder, without biliary ductal dilatation. Pancreas: Normal, without mass or ductal dilatation.  Spleen: Normal in size, without focal abnormality. Adrenals/Urinary Tract: Normal adrenal glands. Normal kidneys, without hydronephrosis. The bladder is decompressed. Apparent wall thickening may be secondary. Stomach/Bowel: The gastric antrum is underdistended and appears thick walled including at 1.5 cm on 59/2. Similar 1.6 cm on the prior. Colonic stool burden suggests constipation. Scattered colonic diverticula. Normal terminal ileum and appendix. Borderline prominent upper pelvic fluid-filled small bowel loops of up to 2.5 cm. No proximal small bowel distension. Vascular/Lymphatic: Aortic atherosclerosis. No abdominal adenopathy. A left external iliac node measures 11 mm on 101/2 is similar to on the prior exam (when remeasured). Decreased size of tiny left obturator nodes including up to 4 mm today versus 9 mm on the prior. Reproductive: Hysterectomy. Other: Omental thickening/nodularity again identified. Example right abdomen at 9 mm on 74/2 versus 14 mm on the prior exam (when remeasured). Soft tissue fullness about the superior aspect of the left vaginal cuff is less well-defined today. Example at on the order of 3.3 x 2.4 cm on 97/2 versus 5.3 x 5.0 cm when remeasured in a similar fashion on the prior. Mild ventral/periumbilical hernia or laxity contains fat and peritoneal tumor, similar on 77/2. Musculoskeletal: No acute osseous abnormality. IMPRESSION: 1. Response to therapy since outside CTs of 01/28/2023. Improvement in abdominopelvic nodal/peritoneal metastasis. Resolution of thoracic adenopathy. 2. Developing sclerotic lesions within the thoracic spine which given response to therapy elsewhere likely represent treated previously CT occult metastasis. 3. Areas of borderline to mild small bowel dilatation within the upper pelvis could represent ileus or low-grade partial obstruction, given peritoneal disease. 4.  Aortic Atherosclerosis (ICD10-I70.0). 5. Similar gastric antral thickening in the setting  of underdistention. Suspicious for gastritis. Electronically Signed   By: Jeronimo Greaves M.D.   On: 04/08/2023 16:29      04/14/2023 Tumor Marker   Patient's tumor was tested for the following markers: CA-125. Results of the tumor marker test revealed 100.   05/02/2023 Tumor Marker   Patient's tumor was tested for the following markers: CA-125. Results of the tumor marker test revealed 82.2.   06/02/2023 Tumor Marker   Patient's tumor was tested for the following markers: CA-125. Results of the tumor marker test revealed 166.   06/17/2023 Imaging   CT CHEST ABDOMEN PELVIS W CONTRAST  Result Date: 06/17/2023 CLINICAL DATA:  History of endometrial cancer/uterine cancer. Evaluate treatment response to chemotherapy. * Tracking Code: BO *. EXAM: CT CHEST, ABDOMEN, AND PELVIS WITH CONTRAST TECHNIQUE: Multidetector CT imaging of the chest, abdomen and pelvis was performed following the standard protocol during bolus administration of intravenous contrast. RADIATION DOSE REDUCTION: This exam was performed according to the departmental dose-optimization program which includes automated  exposure control, adjustment of the mA and/or kV according to patient size and/or use of iterative reconstruction technique. CONTRAST:  OMNIPAQUE IOHEXOL 300 MG/ML  SOLN COMPARISON:  Multiple priors including most recent CT April 08, 2023 FINDINGS: CT CHEST FINDINGS Cardiovascular: Right chest Port-A-Cath with tip in the right atrium. Aortic atherosclerosis. No central pulmonary embolus on this nondedicated study. Normal size heart. No significant pericardial effusion/thickening. Mediastinum/Nodes: No suspicious thyroid nodule. No pathologically enlarged mediastinal, hilar or axillary lymph nodes. Right paratracheal lymph node now measures 5 mm in short axis on image 17/2 previously 7 mm prevascular lymph node now measures 3 mm in short axis on image 20/2 previously 5 mm the esophagus is grossly unremarkable. Lungs/Pleura: No  suspicious pulmonary nodules or masses. No pleural effusion. No pneumothorax. Musculoskeletal: Similar appearance of the sclerotic lesions in the T2 and T8 vertebral bodies. No new aggressive lytic or blastic lesion of bone. CT ABDOMEN PELVIS FINDINGS Hepatobiliary: No suspicious hepatic lesion. Gallbladder is unremarkable. No biliary ductal dilation. Pancreas: No pancreatic ductal dilation or evidence of acute inflammation Spleen: No splenomegaly. Adrenals/Urinary Tract: Bilateral adrenal glands appear normal. No hydronephrosis. Kidneys demonstrate symmetric enhancement. Urinary bladder is minimally distended limiting evaluation Stomach/Bowel: No radiopaque enteric contrast material was administered. Similar asymmetric thickening of the anterior gastric antrum measuring 15 mm on image 64/2, unchanged. No pathologic dilation of small or large bowel. Colonic diverticulosis without findings of acute diverticulitis. Vascular/Lymphatic: Aortic atherosclerosis. Normal caliber abdominal aorta. Smooth IVC contours. The portal, splenic and superior mesenteric veins are patent. Left external iliac lymph node measures 9 mm in short axis on image 102/2 previously 11 mm. -left obturator lymph node measures 2 mm in short axis on image 99/2 previously 4 mm. Reproductive: Uterus is surgically absent. Nodular soft tissue along the left side of the vaginal cuff now measures 2.1 x 1.8 cm on image 98/2 previously 3.3 x 2.4 cm. Other: Peritoneal/omental thickening and nodularity is decreased from prior. For instance in the central abdomen subjacent to the umbilicus nodular focus measures 10 mm on image 79/2 previously 2.2 cm. No significant abdominopelvic free fluid. Musculoskeletal: No new suspicious lytic or blastic lesion of bone. Multilevel degenerative changes spine. IMPRESSION: 1. Findings consistent with treatment response including decreased size of the nodular soft tissue along the left side of the vaginal cuff, decreased size  of the peritoneal/omental thickening and nodularity, and decreased size of the mediastinal, left external iliac and left obturator lymph nodes. 2. Similar appearance of the sclerotic lesions in the T2 and T8 vertebral bodies. No new suspicious osseous lesions. 3. Similar asymmetric thickening of the anterior gastric antrum measuring 15 mm, unchanged. 4.  Aortic Atherosclerosis (ICD10-I70.0). Electronically Signed   By: Maudry Mayhew M.D.   On: 06/17/2023 14:46        PHYSICAL EXAMINATION: ECOG PERFORMANCE STATUS: 2 - Symptomatic, <50% confined to bed  Vitals:   07/10/23 1357  BP: (!) 140/71  Pulse: 84  Resp: 18  Temp: 98.3 F (36.8 C)  SpO2: 100%   Filed Weights   07/10/23 1357  Weight: 170 lb (77.1 kg)    GENERAL:alert, no distress and comfortable   LABORATORY DATA:  I have reviewed the data as listed    Component Value Date/Time   NA 134 (L) 07/10/2023 1331   NA 140 03/25/2023 1541   K 3.8 07/10/2023 1331   CL 96 (L) 07/10/2023 1331   CO2 28 07/10/2023 1331   GLUCOSE 105 (H) 07/10/2023 1331   GLUCOSE 93 09/10/2006  0834   BUN 20 07/10/2023 1331   BUN 12 03/25/2023 1541   CREATININE 0.87 07/10/2023 1331   CREATININE 0.64 07/13/2014 1621   CALCIUM 10.1 07/10/2023 1331   CALCIUM 9.7 03/29/2010 1554   PROT 7.6 07/10/2023 1331   PROT 7.6 11/09/2020 1156   ALBUMIN 4.3 07/10/2023 1331   ALBUMIN 4.4 11/09/2020 1156   AST 12 (L) 07/10/2023 1331   ALT 12 07/10/2023 1331   ALKPHOS 128 (H) 07/10/2023 1331   BILITOT 0.3 07/10/2023 1331   GFRNONAA >60 07/10/2023 1331   GFRNONAA >89 07/13/2014 1621   GFRAA 86 11/09/2020 1156   GFRAA >89 07/13/2014 1621    No results found for: "SPEP", "UPEP"  Lab Results  Component Value Date   WBC 10.1 07/10/2023   NEUTROABS 7.1 07/10/2023   HGB 10.1 (L) 07/10/2023   HCT 31.5 (L) 07/10/2023   MCV 86.5 07/10/2023   PLT 384 07/10/2023      Chemistry      Component Value Date/Time   NA 134 (L) 07/10/2023 1331   NA 140  03/25/2023 1541   K 3.8 07/10/2023 1331   CL 96 (L) 07/10/2023 1331   CO2 28 07/10/2023 1331   BUN 20 07/10/2023 1331   BUN 12 03/25/2023 1541   CREATININE 0.87 07/10/2023 1331   CREATININE 0.64 07/13/2014 1621      Component Value Date/Time   CALCIUM 10.1 07/10/2023 1331   CALCIUM 9.7 03/29/2010 1554   ALKPHOS 128 (H) 07/10/2023 1331   AST 12 (L) 07/10/2023 1331   ALT 12 07/10/2023 1331   BILITOT 0.3 07/10/2023 1331       RADIOGRAPHIC STUDIES: I have personally reviewed the radiological images as listed and agreed with the findings in the report. DG Abd 1 View  Result Date: 07/10/2023 CLINICAL DATA:  Abdominal pain, vomiting. History of uterine cancer. EXAM: ABDOMEN - 1 VIEW COMPARISON:  April 09, 2022. FINDINGS: The bowel gas pattern is normal. Surgical clip is seen over the left sacrum. No radio-opaque calculi or other significant radiographic abnormality are seen. IMPRESSION: No abnormal bowel dilatation. Electronically Signed   By: Lupita Raider M.D.   On: 07/10/2023 14:18   CT CHEST ABDOMEN PELVIS W CONTRAST  Result Date: 06/17/2023 CLINICAL DATA:  History of endometrial cancer/uterine cancer. Evaluate treatment response to chemotherapy. * Tracking Code: BO *. EXAM: CT CHEST, ABDOMEN, AND PELVIS WITH CONTRAST TECHNIQUE: Multidetector CT imaging of the chest, abdomen and pelvis was performed following the standard protocol during bolus administration of intravenous contrast. RADIATION DOSE REDUCTION: This exam was performed according to the departmental dose-optimization program which includes automated exposure control, adjustment of the mA and/or kV according to patient size and/or use of iterative reconstruction technique. CONTRAST:  OMNIPAQUE IOHEXOL 300 MG/ML  SOLN COMPARISON:  Multiple priors including most recent CT April 08, 2023 FINDINGS: CT CHEST FINDINGS Cardiovascular: Right chest Port-A-Cath with tip in the right atrium. Aortic atherosclerosis. No central pulmonary  embolus on this nondedicated study. Normal size heart. No significant pericardial effusion/thickening. Mediastinum/Nodes: No suspicious thyroid nodule. No pathologically enlarged mediastinal, hilar or axillary lymph nodes. Right paratracheal lymph node now measures 5 mm in short axis on image 17/2 previously 7 mm prevascular lymph node now measures 3 mm in short axis on image 20/2 previously 5 mm the esophagus is grossly unremarkable. Lungs/Pleura: No suspicious pulmonary nodules or masses. No pleural effusion. No pneumothorax. Musculoskeletal: Similar appearance of the sclerotic lesions in the T2 and T8 vertebral bodies. No new  aggressive lytic or blastic lesion of bone. CT ABDOMEN PELVIS FINDINGS Hepatobiliary: No suspicious hepatic lesion. Gallbladder is unremarkable. No biliary ductal dilation. Pancreas: No pancreatic ductal dilation or evidence of acute inflammation Spleen: No splenomegaly. Adrenals/Urinary Tract: Bilateral adrenal glands appear normal. No hydronephrosis. Kidneys demonstrate symmetric enhancement. Urinary bladder is minimally distended limiting evaluation Stomach/Bowel: No radiopaque enteric contrast material was administered. Similar asymmetric thickening of the anterior gastric antrum measuring 15 mm on image 64/2, unchanged. No pathologic dilation of small or large bowel. Colonic diverticulosis without findings of acute diverticulitis. Vascular/Lymphatic: Aortic atherosclerosis. Normal caliber abdominal aorta. Smooth IVC contours. The portal, splenic and superior mesenteric veins are patent. Left external iliac lymph node measures 9 mm in short axis on image 102/2 previously 11 mm. -left obturator lymph node measures 2 mm in short axis on image 99/2 previously 4 mm. Reproductive: Uterus is surgically absent. Nodular soft tissue along the left side of the vaginal cuff now measures 2.1 x 1.8 cm on image 98/2 previously 3.3 x 2.4 cm. Other: Peritoneal/omental thickening and nodularity is  decreased from prior. For instance in the central abdomen subjacent to the umbilicus nodular focus measures 10 mm on image 79/2 previously 2.2 cm. No significant abdominopelvic free fluid. Musculoskeletal: No new suspicious lytic or blastic lesion of bone. Multilevel degenerative changes spine. IMPRESSION: 1. Findings consistent with treatment response including decreased size of the nodular soft tissue along the left side of the vaginal cuff, decreased size of the peritoneal/omental thickening and nodularity, and decreased size of the mediastinal, left external iliac and left obturator lymph nodes. 2. Similar appearance of the sclerotic lesions in the T2 and T8 vertebral bodies. No new suspicious osseous lesions. 3. Similar asymmetric thickening of the anterior gastric antrum measuring 15 mm, unchanged. 4.  Aortic Atherosclerosis (ICD10-I70.0). Electronically Signed   By: Maudry Mayhew M.D.   On: 06/17/2023 14:46

## 2023-07-10 NOTE — Assessment & Plan Note (Signed)
She has chronic nausea over the past 4 months This is despite excellent response to treatment She is taking antiemetics as needed We will prescribe IV antiemetics and IV fluids today I will order urinalysis and urine culture to rule out infection If test come back unremarkable, I will order repeat CT imaging

## 2023-07-10 NOTE — Assessment & Plan Note (Signed)
She has significant abdominal symptoms with bloating, constipation and nausea and poor oral intake of unknown etiology I have reviewed her abdominal x-ray independently There is no signs of bowel obstruction but I suspect she is constipated I will order urinalysis and urine culture to rule out urinary tract infection if the tests are unremarkable, I will repeat CT imaging to determine the cause of her abdominal symptoms I reiterated to the patient that her symptom is not caused by immunotherapy

## 2023-07-11 ENCOUNTER — Other Ambulatory Visit: Payer: Self-pay | Admitting: Hematology and Oncology

## 2023-07-11 ENCOUNTER — Telehealth: Payer: Self-pay

## 2023-07-11 DIAGNOSIS — R110 Nausea: Secondary | ICD-10-CM

## 2023-07-11 DIAGNOSIS — C55 Malignant neoplasm of uterus, part unspecified: Secondary | ICD-10-CM

## 2023-07-11 LAB — URINE CULTURE: Culture: <10000 — AB

## 2023-07-11 NOTE — Telephone Encounter (Signed)
Called and given below message. She verbalized understanding. Given radiology #, she will call to schedule. She had a large emesis episode this am and then felt better after vomiting. Denies nausea at this time. She is going to eat breakfast and try to eat every 2 hours during the day. She is questioning if Jemperli dose can be reduced in the future. Told her I would call her back after she schedules CT with appt with Dr. [name] and she can discuss at next visit.  Instructed to go to the ER with worsening symptoms. She verbalized understanding.  Given above message to Dr. [name].

## 2023-07-11 NOTE — Telephone Encounter (Signed)
-----   Message from [employer] sent at 07/11/2023  8:20 AM EDT ----- Xray and UA does not provide explanation for her symptoms I recommend CT scan to be done next week, order is in Please help schedule and cancel her appt with me next week Let me know when it is done so I can see her back

## 2023-07-12 ENCOUNTER — Other Ambulatory Visit: Payer: Self-pay

## 2023-07-12 ENCOUNTER — Emergency Department (HOSPITAL_COMMUNITY)
Admission: EM | Admit: 2023-07-12 | Discharge: 2023-07-12 | Disposition: A | Discharge status: Home / Self Care | Payer: Medicare HMO

## 2023-07-12 ENCOUNTER — Encounter (HOSPITAL_COMMUNITY): Payer: Self-pay

## 2023-07-12 ENCOUNTER — Emergency Department (HOSPITAL_COMMUNITY): Payer: Medicare HMO

## 2023-07-12 DIAGNOSIS — C55 Malignant neoplasm of uterus, part unspecified: Secondary | ICD-10-CM | POA: Diagnosis not present

## 2023-07-12 DIAGNOSIS — R112 Nausea with vomiting, unspecified: Secondary | ICD-10-CM | POA: Insufficient documentation

## 2023-07-12 DIAGNOSIS — E876 Hypokalemia: Secondary | ICD-10-CM | POA: Insufficient documentation

## 2023-07-12 DIAGNOSIS — C541 Malignant neoplasm of endometrium: Secondary | ICD-10-CM | POA: Diagnosis not present

## 2023-07-12 DIAGNOSIS — E860 Dehydration: Secondary | ICD-10-CM | POA: Diagnosis not present

## 2023-07-12 DIAGNOSIS — C786 Secondary malignant neoplasm of retroperitoneum and peritoneum: Secondary | ICD-10-CM | POA: Diagnosis not present

## 2023-07-12 DIAGNOSIS — I10 Essential (primary) hypertension: Secondary | ICD-10-CM | POA: Diagnosis not present

## 2023-07-12 LAB — CBC
HCT: 31.5 % — ABNORMAL LOW (ref 36.0–46.0)
Hemoglobin: 9.7 g/dL — ABNORMAL LOW (ref 12.0–15.0)
MCH: 27.6 pg (ref 26.0–34.0)
MCHC: 30.8 g/dL (ref 30.0–36.0)
MCV: 89.7 fL (ref 80.0–100.0)
Platelets: 364 10*3/uL (ref 150–400)
RBC: 3.51 MIL/uL — ABNORMAL LOW (ref 3.87–5.11)
RDW: 16 % — ABNORMAL HIGH (ref 11.5–15.5)
WBC: 9.5 10*3/uL (ref 4.0–10.5)
nRBC: 0 % (ref 0.0–0.2)

## 2023-07-12 LAB — COMPREHENSIVE METABOLIC PANEL
ALT: 13 U/L (ref 0–44)
AST: 12 U/L — ABNORMAL LOW (ref 15–41)
Albumin: 3.9 g/dL (ref 3.5–5.0)
Alkaline Phosphatase: 108 U/L (ref 38–126)
Anion gap: 11 (ref 5–15)
BUN: 19 mg/dL (ref 8–23)
CO2: 27 mmol/L (ref 22–32)
Calcium: 9.6 mg/dL (ref 8.9–10.3)
Chloride: 96 mmol/L — ABNORMAL LOW (ref 98–111)
Creatinine, Ser: 0.82 mg/dL (ref 0.44–1.00)
GFR, Estimated: >60 mL/min (ref >60)
Glucose, Bld: 110 mg/dL — ABNORMAL HIGH (ref 70–99)
Potassium: 2.8 mmol/L — ABNORMAL LOW (ref 3.5–5.1)
Sodium: 134 mmol/L — ABNORMAL LOW (ref 135–145)
Total Bilirubin: 0.9 mg/dL (ref 0.3–1.2)
Total Protein: 7.1 g/dL (ref 6.5–8.1)

## 2023-07-12 LAB — CA 125: Cancer Antigen (CA) 125: 313 U/mL — ABNORMAL HIGH (ref 0.0–38.1)

## 2023-07-12 LAB — LIPASE, BLOOD: Lipase: 46 U/L (ref 11–51)

## 2023-07-12 MED ORDER — ONDANSETRON HCL 4 MG/2ML IJ SOLN
4.0000 mg | Freq: Once | INTRAMUSCULAR | Status: AC
  Administered 2023-07-12: 4 mg via INTRAVENOUS
  Filled 2023-07-12: qty 2

## 2023-07-12 MED ORDER — HEPARIN SOD (PORK) LOCK FLUSH 100 UNIT/ML IV SOLN
INTRAVENOUS | Status: AC
  Administered 2023-07-12: 500 [IU]
  Filled 2023-07-12: qty 5

## 2023-07-12 MED ORDER — PROMETHAZINE HCL 25 MG RE SUPP: 25.0000 mg | Freq: Four times a day (QID) | RECTAL | 0 refills | Status: AC | PRN

## 2023-07-12 MED ORDER — POTASSIUM CHLORIDE CRYS ER 20 MEQ PO TBCR
40.0000 meq | EXTENDED_RELEASE_TABLET | Freq: Once | ORAL | Status: AC
  Administered 2023-07-12: 40 meq via ORAL
  Filled 2023-07-12: qty 2

## 2023-07-12 MED ORDER — IOHEXOL 300 MG/ML  SOLN
100.0000 mL | Freq: Once | INTRAMUSCULAR | Status: AC | PRN
  Administered 2023-07-12: 100 mL via INTRAVENOUS

## 2023-07-12 NOTE — ED Notes (Signed)
Patient transported to CT 

## 2023-07-12 NOTE — ED Triage Notes (Signed)
Patient is [place] patient. Reports nausea and vomiting since 8/30. Patient has seen oncology multiple times for same. Told to come to ED.

## 2023-07-12 NOTE — ED Provider Notes (Signed)
[facility] AT [facility] Provider Note   CSN: [id number] Arrival date & time: 07/12/23  1141     History {Add pertinent medical, surgical, social history, OB history to HPI:1} Chief Complaint  Patient presents with   Emesis    Sherri Gill is a 72 y.o. female.  This is a 72 year old female presenting emergency department for nausea and vomiting.  Patient with cancer undergoing chemotherapy.  She reports epigastric pain and discomfort since 8/30 with daily episodes of vomiting.  Primarily in the morning.  She is moving her bowels regularly.  Last bowel movement this morning.  No fevers, no chills no chest pain no shortness of breath.  No prior abdominal surgeries.  She called her oncologist who recommended evaluation in the emergency department.  Patient taking Zofran and Compazine at home, but little relief.   Emesis      Home Medications Prior to Admission medications   Medication Sig Start Date End Date Taking? Authorizing Provider  acetaminophen (TYLENOL) 500 MG tablet Take 1,000 mg by mouth every 6 (six) hours as needed for mild pain.    [provider]  albuterol (VENTOLIN HFA) 108 (90 Base) MCG/ACT inhaler Inhale 2 puffs into the lungs every 4 (four) hours as needed for wheezing or shortness of breath. 12/31/21   Glade Lloyd, MD  atorvastatin (LIPITOR) 20 MG tablet Take 1 tablet (20 mg total) by mouth at bedtime. 08/28/22 08/28/23  Atway, Rayann N, DO  B Complex-C (B-COMPLEX WITH VITAMIN C) tablet Take 1 tablet by mouth in the morning.    [provider]  Blood Glucose Monitoring Suppl (ONE TOUCH ULTRA MINI) w/Device KIT Please use as directed. 12/20/16   Beather Arbour, MD  calcium carbonate (TUMS - DOSED IN MG ELEMENTAL CALCIUM) 500 MG chewable tablet Chew 1 tablet by mouth 2 (two) times daily.    [provider]  Cholecalciferol 1000 units capsule Take 1 capsule (1,000 Units total) by mouth daily. 11/08/16    Valentino Nose, MD  diltiazem (CARDIZEM CD) 120 MG 24 hr capsule Take 1 capsule (120 mg total) by mouth daily. 02/20/23   [name], MD  dorzolamide-timolol (COSOPT) 22.3-6.8 MG/ML ophthalmic solution Place 1 drop into both eyes 2 (two) times daily. 12/15/21   [provider]  glucose blood (ONE TOUCH ULTRA TEST) test strip Use as instructed 12/20/16   Beather Arbour, MD  hydrALAZINE (APRESOLINE) 25 MG tablet TAKE 1 TABLET BY MOUTH THREE TIMES DAILY 01/03/23   [name], MD  HYDROmorphone (DILAUDID) 2 MG tablet Take 1 tablet (2 mg) by mouth 3 times daily as needed for severe pain. 06/20/23   Artis Delay, MD  Insulin Pen Needle 32G X 4 MM MISC 1 each by Does not apply route daily. 09/01/19   [name], Sherlyn Lees, MD  latanoprost (XALATAN) 0.005 % ophthalmic solution Place 1 drop into both eyes at bedtime. 12/15/21   [provider]  levothyroxine (SYNTHROID) 50 MCG tablet Take 1 tablet (50 mcg total) by mouth daily before breakfast. 06/20/23   Artis Delay, MD  lidocaine-prilocaine (EMLA) cream Apply to affected area once as directed 01/31/23   Artis Delay, MD  losartan (COZAAR) 100 MG tablet Take 1 tablet (100 mg total) by mouth at bedtime. 02/17/23 02/12/24  [name], MD  magnesium oxide (MAG-OX) 400 (240 Mg) MG tablet Take 1 tablet (400 mg total) by mouth 2 (two) times daily. 03/21/23   Artis Delay, MD  metFORMIN (GLUCOPHAGE)  1000 MG tablet Take 1 tablet (1,000 mg total) by mouth 2 (two) times daily. 08/28/22 08/28/23  Atway, Rayann N, DO  methylPREDNISolone (MEDROL DOSEPAK) 4 MG TBPK tablet Take 6 tablets by mouth on day 1 then decrease by 1 tablet daily until finished (6-5-4-3-2-1) 07/07/23   Walisiewicz, Yvonna Alanis E, PA-C  ondansetron (ZOFRAN) 8 MG tablet Take 1 tablet (8 mg total) by mouth every 8 (eight) hours as needed for nausea or vomiting. Start on the third day after chemotherapy. 01/31/23   Artis Delay, MD  [facility] DELICA LANCETS 33G MISC Please use as directed.  12/20/16   Beather Arbour, MD  pantoprazole (PROTONIX) 40 MG tablet Take 1 tablet by mouth daily. 02/20/23 02/15/24  [name], MD  polyethylene glycol powder (GLYCOLAX/MIRALAX) 17 GM/SCOOP powder Take 17 g by mouth daily. 03/28/22   Cross, Efraim Kaufmann D, NP  prochlorperazine (COMPAZINE) 10 MG tablet Take 1 tablet (10 mg total) by mouth every 6 (six) hours as needed for nausea or vomiting. 01/31/23   Artis Delay, MD  rivaroxaban (XARELTO) 20 MG TABS tablet TAKE 1 TABLET BY MOUTH ONCE DAILY WITH SUPPER 06/20/23   [name], MD      Allergies    Codeine sulfate, Penicillins, Percocet [oxycodone-acetaminophen], and Zestril [lisinopril]    Review of Systems   Review of Systems  Gastrointestinal:  Positive for vomiting.    Physical Exam Updated Vital Signs BP 135/68   Pulse 83   Temp 97.8 F (36.6 C) (Oral)   Resp 18   Ht 5\' 3"  (1.6 m)   Wt 77.1 kg   SpO2 96%   BMI 30.11 kg/m  Physical Exam Vitals and nursing note reviewed.  Constitutional:      Appearance: She is not toxic-appearing.  HENT:     Head: Normocephalic.     Nose: Nose normal.     Mouth/Throat:     Mouth: Mucous membranes are moist.  Eyes:     Conjunctiva/sclera: Conjunctivae normal.  Cardiovascular:     Rate and Rhythm: Normal rate.  Pulmonary:     Effort: Pulmonary effort is normal.     Breath sounds: Normal breath sounds.  Abdominal:     General: Abdomen is flat. There is no distension.     Tenderness: There is no abdominal tenderness. There is no guarding or rebound.  Musculoskeletal:        General: Normal range of motion.  Skin:    General: Skin is warm and dry.     Capillary Refill: Capillary refill takes less than 2 seconds.  Neurological:     Mental Status: She is alert and oriented to person, place, and time.  Psychiatric:        Mood and Affect: Mood normal.        Behavior: Behavior normal.     ED Results / Procedures / Treatments   Labs (all labs ordered are listed, but only  abnormal results are displayed) Labs Reviewed  CBC - Abnormal; Notable for the following components:      Result Value   RBC 3.51 (*)    Hemoglobin 9.7 (*)    HCT 31.5 (*)    RDW 16.0 (*)    All other components within normal limits  COMPREHENSIVE METABOLIC PANEL - Abnormal; Notable for the following components:   Sodium 134 (*)    Potassium 2.8 (*)    Chloride 96 (*)    Glucose, Bld 110 (*)    AST 12 (*)  All other components within normal limits  LIPASE, BLOOD    EKG None  Radiology No results found.  Procedures Procedures  {Document cardiac monitor, telemetry assessment procedure when appropriate:1}  Medications Ordered in ED Medications  ondansetron (ZOFRAN) injection 4 mg (4 mg Intravenous Given 07/12/23 1412)  potassium chloride SA (KLOR-CON M) CR tablet 40 mEq (40 mEq Oral Given 07/12/23 1412)  iohexol (OMNIPAQUE) 300 MG/ML solution 100 mL (100 mLs Intravenous Contrast Given 07/12/23 1418)    ED Course/ Medical Decision Making/ A&P   {   Click here for ABCD2, HEART and other calculatorsREFRESH Note before signing :1}                              Medical Decision Making Well-appearing 53-year-old female presented emergency department for abdominal pain nausea vomiting.  Afebrile, slightly elevated heart rate on arrival, but improved without intervention.  Physical exam with benign, soft nontender abdomen.  Patient with history of cancer, per review of chart appears oncologist recommending CT scan.  Labs with a hypokalemia, repleted.  No leukocytosis to suggest systemic infection or neutropenic fever.  Baseline anemia.  Lipase not elevated; pancreatitis unlikely.  Patient given Zofran.  Amount and/or Complexity of Data Reviewed Labs: ordered.    Details: Considered UA, however had negative UA from cancer center 2 days ago. Radiology: ordered. ECG/medicine tests: ordered.  Risk Prescription drug management.    {Document critical care time when  appropriate:1} {Document review of labs and clinical decision tools ie heart score, Chads2Vasc2 etc:1}  {Document your independent review of radiology images, and any outside records:1} {Document your discussion with family members, caretakers, and with consultants:1} {Document social determinants of health affecting pt's care:1} {Document your decision making why or why not admission, treatments were needed:1} Final Clinical Impression(s) / ED Diagnoses Final diagnoses:  None    Rx / DC Orders ED Discharge Orders     None

## 2023-07-14 ENCOUNTER — Emergency Department (HOSPITAL_BASED_OUTPATIENT_CLINIC_OR_DEPARTMENT_OTHER): Payer: Medicare HMO | Admitting: Radiology

## 2023-07-14 ENCOUNTER — Other Ambulatory Visit: Payer: Self-pay

## 2023-07-14 ENCOUNTER — Inpatient Hospital Stay (HOSPITAL_BASED_OUTPATIENT_CLINIC_OR_DEPARTMENT_OTHER)
Admission: EM | Admit: 2023-07-14 | Discharge: 2023-08-29 | DRG: 374 | Disposition: E | Payer: Medicare HMO | Attending: Internal Medicine | Admitting: Internal Medicine

## 2023-07-14 ENCOUNTER — Telehealth: Payer: Self-pay

## 2023-07-14 ENCOUNTER — Emergency Department (HOSPITAL_BASED_OUTPATIENT_CLINIC_OR_DEPARTMENT_OTHER): Payer: Medicare HMO

## 2023-07-14 ENCOUNTER — Encounter (HOSPITAL_BASED_OUTPATIENT_CLINIC_OR_DEPARTMENT_OTHER): Payer: Self-pay

## 2023-07-14 ENCOUNTER — Other Ambulatory Visit (HOSPITAL_BASED_OUTPATIENT_CLINIC_OR_DEPARTMENT_OTHER): Payer: Self-pay

## 2023-07-14 ENCOUNTER — Telehealth: Payer: Self-pay | Admitting: Hematology and Oncology

## 2023-07-14 DIAGNOSIS — D61818 Other pancytopenia: Secondary | ICD-10-CM | POA: Insufficient documentation

## 2023-07-14 DIAGNOSIS — D61810 Antineoplastic chemotherapy induced pancytopenia: Secondary | ICD-10-CM | POA: Diagnosis not present

## 2023-07-14 DIAGNOSIS — I480 Paroxysmal atrial fibrillation: Secondary | ICD-10-CM | POA: Diagnosis not present

## 2023-07-14 DIAGNOSIS — R109 Unspecified abdominal pain: Secondary | ICD-10-CM | POA: Diagnosis not present

## 2023-07-14 DIAGNOSIS — D649 Anemia, unspecified: Secondary | ICD-10-CM | POA: Diagnosis not present

## 2023-07-14 DIAGNOSIS — E860 Dehydration: Secondary | ICD-10-CM | POA: Diagnosis not present

## 2023-07-14 DIAGNOSIS — Z885 Allergy status to narcotic agent status: Secondary | ICD-10-CM

## 2023-07-14 DIAGNOSIS — T451X5A Adverse effect of antineoplastic and immunosuppressive drugs, initial encounter: Secondary | ICD-10-CM | POA: Diagnosis not present

## 2023-07-14 DIAGNOSIS — K922 Gastrointestinal hemorrhage, unspecified: Secondary | ICD-10-CM | POA: Diagnosis not present

## 2023-07-14 DIAGNOSIS — D849 Immunodeficiency, unspecified: Secondary | ICD-10-CM | POA: Diagnosis not present

## 2023-07-14 DIAGNOSIS — R54 Age-related physical debility: Secondary | ICD-10-CM | POA: Diagnosis present

## 2023-07-14 DIAGNOSIS — E8721 Acute metabolic acidosis: Secondary | ICD-10-CM | POA: Diagnosis not present

## 2023-07-14 DIAGNOSIS — E1149 Type 2 diabetes mellitus with other diabetic neurological complication: Secondary | ICD-10-CM | POA: Diagnosis not present

## 2023-07-14 DIAGNOSIS — K254 Chronic or unspecified gastric ulcer with hemorrhage: Secondary | ICD-10-CM | POA: Diagnosis not present

## 2023-07-14 DIAGNOSIS — I4819 Other persistent atrial fibrillation: Secondary | ICD-10-CM | POA: Diagnosis not present

## 2023-07-14 DIAGNOSIS — D75839 Thrombocytosis, unspecified: Secondary | ICD-10-CM | POA: Diagnosis not present

## 2023-07-14 DIAGNOSIS — B370 Candidal stomatitis: Secondary | ICD-10-CM | POA: Diagnosis not present

## 2023-07-14 DIAGNOSIS — E039 Hypothyroidism, unspecified: Secondary | ICD-10-CM | POA: Diagnosis not present

## 2023-07-14 DIAGNOSIS — K5669 Other partial intestinal obstruction: Secondary | ICD-10-CM | POA: Diagnosis present

## 2023-07-14 DIAGNOSIS — K449 Diaphragmatic hernia without obstruction or gangrene: Secondary | ICD-10-CM | POA: Diagnosis not present

## 2023-07-14 DIAGNOSIS — I129 Hypertensive chronic kidney disease with stage 1 through stage 4 chronic kidney disease, or unspecified chronic kidney disease: Secondary | ICD-10-CM | POA: Diagnosis not present

## 2023-07-14 DIAGNOSIS — Z880 Allergy status to penicillin: Secondary | ICD-10-CM

## 2023-07-14 DIAGNOSIS — K317 Polyp of stomach and duodenum: Secondary | ICD-10-CM | POA: Diagnosis present

## 2023-07-14 DIAGNOSIS — E871 Hypo-osmolality and hyponatremia: Secondary | ICD-10-CM | POA: Diagnosis present

## 2023-07-14 DIAGNOSIS — N2889 Other specified disorders of kidney and ureter: Secondary | ICD-10-CM | POA: Diagnosis not present

## 2023-07-14 DIAGNOSIS — Z91014 Allergy to mammalian meats: Secondary | ICD-10-CM

## 2023-07-14 DIAGNOSIS — Z818 Family history of other mental and behavioral disorders: Secondary | ICD-10-CM

## 2023-07-14 DIAGNOSIS — R933 Abnormal findings on diagnostic imaging of other parts of digestive tract: Secondary | ICD-10-CM | POA: Diagnosis not present

## 2023-07-14 DIAGNOSIS — G893 Neoplasm related pain (acute) (chronic): Secondary | ICD-10-CM | POA: Diagnosis not present

## 2023-07-14 DIAGNOSIS — Z515 Encounter for palliative care: Secondary | ICD-10-CM | POA: Diagnosis not present

## 2023-07-14 DIAGNOSIS — E1122 Type 2 diabetes mellitus with diabetic chronic kidney disease: Secondary | ICD-10-CM | POA: Diagnosis not present

## 2023-07-14 DIAGNOSIS — D132 Benign neoplasm of duodenum: Secondary | ICD-10-CM | POA: Diagnosis present

## 2023-07-14 DIAGNOSIS — Z794 Long term (current) use of insulin: Secondary | ICD-10-CM | POA: Diagnosis not present

## 2023-07-14 DIAGNOSIS — Z6835 Body mass index (BMI) 35.0-35.9, adult: Secondary | ICD-10-CM | POA: Diagnosis not present

## 2023-07-14 DIAGNOSIS — M47816 Spondylosis without myelopathy or radiculopathy, lumbar region: Secondary | ICD-10-CM | POA: Diagnosis present

## 2023-07-14 DIAGNOSIS — C786 Secondary malignant neoplasm of retroperitoneum and peritoneum: Secondary | ICD-10-CM | POA: Diagnosis not present

## 2023-07-14 DIAGNOSIS — D638 Anemia in other chronic diseases classified elsewhere: Secondary | ICD-10-CM | POA: Diagnosis not present

## 2023-07-14 DIAGNOSIS — D89813 Graft-versus-host disease, unspecified: Secondary | ICD-10-CM | POA: Diagnosis present

## 2023-07-14 DIAGNOSIS — J95821 Acute postprocedural respiratory failure: Secondary | ICD-10-CM | POA: Diagnosis not present

## 2023-07-14 DIAGNOSIS — Z90710 Acquired absence of both cervix and uterus: Secondary | ICD-10-CM

## 2023-07-14 DIAGNOSIS — G4700 Insomnia, unspecified: Secondary | ICD-10-CM | POA: Diagnosis present

## 2023-07-14 DIAGNOSIS — Z7189 Other specified counseling: Secondary | ICD-10-CM | POA: Diagnosis not present

## 2023-07-14 DIAGNOSIS — E669 Obesity, unspecified: Secondary | ICD-10-CM | POA: Insufficient documentation

## 2023-07-14 DIAGNOSIS — E785 Hyperlipidemia, unspecified: Secondary | ICD-10-CM | POA: Diagnosis present

## 2023-07-14 DIAGNOSIS — E876 Hypokalemia: Secondary | ICD-10-CM | POA: Diagnosis present

## 2023-07-14 DIAGNOSIS — C55 Malignant neoplasm of uterus, part unspecified: Secondary | ICD-10-CM | POA: Diagnosis not present

## 2023-07-14 DIAGNOSIS — Z8719 Personal history of other diseases of the digestive system: Secondary | ICD-10-CM

## 2023-07-14 DIAGNOSIS — E440 Moderate protein-calorie malnutrition: Secondary | ICD-10-CM | POA: Insufficient documentation

## 2023-07-14 DIAGNOSIS — E11649 Type 2 diabetes mellitus with hypoglycemia without coma: Secondary | ICD-10-CM | POA: Diagnosis not present

## 2023-07-14 DIAGNOSIS — K9421 Gastrostomy hemorrhage: Secondary | ICD-10-CM | POA: Diagnosis not present

## 2023-07-14 DIAGNOSIS — M47814 Spondylosis without myelopathy or radiculopathy, thoracic region: Secondary | ICD-10-CM | POA: Diagnosis present

## 2023-07-14 DIAGNOSIS — Z4682 Encounter for fitting and adjustment of non-vascular catheter: Secondary | ICD-10-CM | POA: Diagnosis not present

## 2023-07-14 DIAGNOSIS — Z7901 Long term (current) use of anticoagulants: Secondary | ICD-10-CM

## 2023-07-14 DIAGNOSIS — K567 Ileus, unspecified: Secondary | ICD-10-CM | POA: Diagnosis present

## 2023-07-14 DIAGNOSIS — R112 Nausea with vomiting, unspecified: Principal | ICD-10-CM

## 2023-07-14 DIAGNOSIS — R140 Abdominal distension (gaseous): Secondary | ICD-10-CM

## 2023-07-14 DIAGNOSIS — K9420 Gastrostomy complication, unspecified: Secondary | ICD-10-CM | POA: Diagnosis not present

## 2023-07-14 DIAGNOSIS — K30 Functional dyspepsia: Secondary | ICD-10-CM | POA: Diagnosis present

## 2023-07-14 DIAGNOSIS — Z87891 Personal history of nicotine dependence: Secondary | ICD-10-CM | POA: Diagnosis not present

## 2023-07-14 DIAGNOSIS — K219 Gastro-esophageal reflux disease without esophagitis: Secondary | ICD-10-CM | POA: Diagnosis not present

## 2023-07-14 DIAGNOSIS — E43 Unspecified severe protein-calorie malnutrition: Secondary | ICD-10-CM | POA: Diagnosis not present

## 2023-07-14 DIAGNOSIS — R1110 Vomiting, unspecified: Secondary | ICD-10-CM | POA: Diagnosis not present

## 2023-07-14 DIAGNOSIS — J9600 Acute respiratory failure, unspecified whether with hypoxia or hypercapnia: Secondary | ICD-10-CM | POA: Diagnosis not present

## 2023-07-14 DIAGNOSIS — C549 Malignant neoplasm of corpus uteri, unspecified: Secondary | ICD-10-CM | POA: Diagnosis not present

## 2023-07-14 DIAGNOSIS — K5730 Diverticulosis of large intestine without perforation or abscess without bleeding: Secondary | ICD-10-CM | POA: Diagnosis not present

## 2023-07-14 DIAGNOSIS — I700 Atherosclerosis of aorta: Secondary | ICD-10-CM | POA: Diagnosis not present

## 2023-07-14 DIAGNOSIS — Z66 Do not resuscitate: Secondary | ICD-10-CM | POA: Diagnosis not present

## 2023-07-14 DIAGNOSIS — I10 Essential (primary) hypertension: Secondary | ICD-10-CM | POA: Diagnosis present

## 2023-07-14 DIAGNOSIS — K311 Adult hypertrophic pyloric stenosis: Secondary | ICD-10-CM | POA: Diagnosis present

## 2023-07-14 DIAGNOSIS — Z79899 Other long term (current) drug therapy: Secondary | ICD-10-CM

## 2023-07-14 DIAGNOSIS — E119 Type 2 diabetes mellitus without complications: Secondary | ICD-10-CM | POA: Diagnosis not present

## 2023-07-14 DIAGNOSIS — Z7984 Long term (current) use of oral hypoglycemic drugs: Secondary | ICD-10-CM

## 2023-07-14 DIAGNOSIS — N179 Acute kidney failure, unspecified: Secondary | ICD-10-CM | POA: Diagnosis not present

## 2023-07-14 DIAGNOSIS — J9601 Acute respiratory failure with hypoxia: Secondary | ICD-10-CM | POA: Diagnosis not present

## 2023-07-14 DIAGNOSIS — Z8661 Personal history of infections of the central nervous system: Secondary | ICD-10-CM

## 2023-07-14 DIAGNOSIS — E538 Deficiency of other specified B group vitamins: Secondary | ICD-10-CM | POA: Diagnosis present

## 2023-07-14 DIAGNOSIS — K5900 Constipation, unspecified: Secondary | ICD-10-CM | POA: Diagnosis not present

## 2023-07-14 DIAGNOSIS — E1142 Type 2 diabetes mellitus with diabetic polyneuropathy: Secondary | ICD-10-CM | POA: Diagnosis not present

## 2023-07-14 DIAGNOSIS — R9389 Abnormal findings on diagnostic imaging of other specified body structures: Secondary | ICD-10-CM | POA: Diagnosis not present

## 2023-07-14 DIAGNOSIS — K250 Acute gastric ulcer with hemorrhage: Secondary | ICD-10-CM | POA: Diagnosis not present

## 2023-07-14 DIAGNOSIS — F419 Anxiety disorder, unspecified: Secondary | ICD-10-CM | POA: Diagnosis present

## 2023-07-14 DIAGNOSIS — K56609 Unspecified intestinal obstruction, unspecified as to partial versus complete obstruction: Secondary | ICD-10-CM | POA: Diagnosis not present

## 2023-07-14 DIAGNOSIS — N189 Chronic kidney disease, unspecified: Secondary | ICD-10-CM | POA: Diagnosis not present

## 2023-07-14 DIAGNOSIS — Z9851 Tubal ligation status: Secondary | ICD-10-CM

## 2023-07-14 DIAGNOSIS — Z79890 Hormone replacement therapy (postmenopausal): Secondary | ICD-10-CM

## 2023-07-14 DIAGNOSIS — D62 Acute posthemorrhagic anemia: Secondary | ICD-10-CM | POA: Diagnosis not present

## 2023-07-14 DIAGNOSIS — R627 Adult failure to thrive: Secondary | ICD-10-CM | POA: Diagnosis present

## 2023-07-14 DIAGNOSIS — Z888 Allergy status to other drugs, medicaments and biological substances status: Secondary | ICD-10-CM

## 2023-07-14 DIAGNOSIS — K5909 Other constipation: Secondary | ICD-10-CM | POA: Diagnosis present

## 2023-07-14 LAB — COMPREHENSIVE METABOLIC PANEL
ALT: 8 U/L (ref 0–44)
AST: 10 U/L — ABNORMAL LOW (ref 15–41)
Albumin: 4.2 g/dL (ref 3.5–5.0)
Alkaline Phosphatase: 117 U/L (ref 38–126)
Anion gap: 10 (ref 5–15)
BUN: 24 mg/dL — ABNORMAL HIGH (ref 8–23)
CO2: 28 mmol/L (ref 22–32)
Calcium: 9.8 mg/dL (ref 8.9–10.3)
Chloride: 97 mmol/L — ABNORMAL LOW (ref 98–111)
Creatinine, Ser: 1.28 mg/dL — ABNORMAL HIGH (ref 0.44–1.00)
GFR, Estimated: 45 mL/min — ABNORMAL LOW (ref >60)
Glucose, Bld: 104 mg/dL — ABNORMAL HIGH (ref 70–99)
Potassium: 3.6 mmol/L (ref 3.5–5.1)
Sodium: 135 mmol/L (ref 135–145)
Total Bilirubin: 0.3 mg/dL (ref 0.3–1.2)
Total Protein: 7.3 g/dL (ref 6.5–8.1)

## 2023-07-14 LAB — CBC WITH DIFFERENTIAL/PLATELET
Abs Immature Granulocytes: 0.06 10*3/uL (ref 0.00–0.07)
Basophils Absolute: 0 10*3/uL (ref 0.0–0.1)
Basophils Relative: 0 %
Eosinophils Absolute: 0 10*3/uL (ref 0.0–0.5)
Eosinophils Relative: 0 %
HCT: 33.9 % — ABNORMAL LOW (ref 36.0–46.0)
Hemoglobin: 10.6 g/dL — ABNORMAL LOW (ref 12.0–15.0)
Immature Granulocytes: 1 %
Lymphocytes Relative: 20 %
Lymphs Abs: 2.2 10*3/uL (ref 0.7–4.0)
MCH: 27.3 pg (ref 26.0–34.0)
MCHC: 31.3 g/dL (ref 30.0–36.0)
MCV: 87.4 fL (ref 80.0–100.0)
Monocytes Absolute: 1.1 10*3/uL — ABNORMAL HIGH (ref 0.1–1.0)
Monocytes Relative: 11 %
Neutro Abs: 7.2 10*3/uL (ref 1.7–7.7)
Neutrophils Relative %: 68 %
Platelets: 377 10*3/uL (ref 150–400)
RBC: 3.88 MIL/uL (ref 3.87–5.11)
RDW: 16.2 % — ABNORMAL HIGH (ref 11.5–15.5)
WBC: 10.6 10*3/uL — ABNORMAL HIGH (ref 4.0–10.5)
nRBC: 0 % (ref 0.0–0.2)

## 2023-07-14 LAB — LIPASE, BLOOD: Lipase: 47 U/L (ref 11–51)

## 2023-07-14 MED ORDER — HYDROMORPHONE HCL 1 MG/ML IJ SOLN
0.5000 mg | INTRAMUSCULAR | Status: DC | PRN
  Administered 2023-07-15 – 2023-08-02 (×16): 0.5 mg via INTRAVENOUS
  Filled 2023-07-14 (×21): qty 0.5

## 2023-07-14 MED ORDER — PROMETHAZINE HCL 25 MG/ML IJ SOLN
INTRAMUSCULAR | Status: AC
  Filled 2023-07-14: qty 1

## 2023-07-14 MED ORDER — METOPROLOL TARTRATE 5 MG/5ML IV SOLN
5.0000 mg | INTRAVENOUS | Status: DC | PRN
  Filled 2023-07-14: qty 5

## 2023-07-14 MED ORDER — ONDANSETRON HCL 4 MG/2ML IJ SOLN
4.0000 mg | Freq: Four times a day (QID) | INTRAMUSCULAR | Status: DC | PRN
  Administered 2023-07-15 – 2023-08-24 (×41): 4 mg via INTRAVENOUS
  Filled 2023-07-14 (×45): qty 2

## 2023-07-14 MED ORDER — HYDRALAZINE HCL 20 MG/ML IJ SOLN: 10.0000 mg | INTRAMUSCULAR | Status: DC | PRN

## 2023-07-14 MED ORDER — ACETAMINOPHEN 650 MG RE SUPP
650.0000 mg | Freq: Four times a day (QID) | RECTAL | Status: DC | PRN
  Administered 2023-07-26 – 2023-08-10 (×7): 650 mg via RECTAL
  Filled 2023-07-14 (×8): qty 1

## 2023-07-14 MED ORDER — SODIUM CHLORIDE 0.9 % IV BOLUS
1000.0000 mL | Freq: Once | INTRAVENOUS | Status: AC
  Administered 2023-07-14: 1000 mL via INTRAVENOUS

## 2023-07-14 MED ORDER — LACTATED RINGERS IV SOLN: INTRAVENOUS | Status: DC

## 2023-07-14 MED ORDER — SODIUM CHLORIDE 0.9 % IV SOLN
12.5000 mg | Freq: Once | INTRAVENOUS | Status: AC
  Administered 2023-07-14: 12.5 mg via INTRAVENOUS
  Filled 2023-07-14: qty 0.5

## 2023-07-14 MED ORDER — INSULIN ASPART 100 UNIT/ML IJ SOLN
0.0000 [IU] | INTRAMUSCULAR | Status: DC
  Administered 2023-07-16: 2 [IU] via SUBCUTANEOUS
  Administered 2023-07-16 – 2023-07-17 (×3): 1 [IU] via SUBCUTANEOUS
  Administered 2023-07-17: 2 [IU] via SUBCUTANEOUS
  Administered 2023-07-17 – 2023-07-23 (×13): 1 [IU] via SUBCUTANEOUS
  Administered 2023-07-23: 2 [IU] via SUBCUTANEOUS
  Administered 2023-07-23 – 2023-07-24 (×2): 1 [IU] via SUBCUTANEOUS

## 2023-07-14 NOTE — Hospital Course (Addendum)
Sherri Gill is a 72 y.o. F with stage IV uterine cancer (s/p TAH, b/l SOO) with peritoneal carcinomatosis on active chemotherapy/immunotherapy, pAF on Xarelto, DM, and HTN who was admitted with intractable nausea and vomiting.

## 2023-07-14 NOTE — H&P (Signed)
History and Physical    Sherri Gill NGE:[id number] DOB: 03-13-1951 DOA: 07/12/2023  PCP: [name], MD  Patient coming from: Home  I have personally briefly reviewed patient's old medical records in [facility]  Chief Complaint: Nausea, vomiting  HPI: Sherri Gill is a 72 y.o. female with medical history significant for uterine cancer (s/p TAH, b/l SOO) with peritoneal carcinomatosis on active immunotherapy, PAF on Xarelto, T2DM, HTN who presented to the ED for evaluation of persistent nausea and vomiting.  Patient states that she has been having persistent nausea and vomiting since the beginning of the month.  She has not been able to maintain adequate oral intake.  She says she will eat small amounts of food and several hours later will develop emesis.  She has been having progressively worsening symptoms including upper abdominal discomfort.    She was seen in the ED on 9/14 for her symptoms.  CT A/P was obtained and showed worsening of gastric wall thickening, distal duodenal wall thickening, and jejunal wall thickening.  Stomach and proximal duodenum were distended.  There were some abnormal stranding along the remaining omentum and abdominal fluid collections along the small bowel mesentery and in the lesser sac.  This was raising concern for possible residual peritoneal tumor causing fluid deposits.  Patient was able to tolerate food and discharged to home.  She has been having to use Senokot and MiraLAX due to infrequent bowel movements.  She says she will have small volume dark green stools.  Due to her persistent symptoms she came back to the ED for further evaluation.  NG tube was placed with significant relief in her symptoms.  She says she is now passing gas.  Abdominal pain is also improved.  [facility] ED Course  Labs/Imaging on admission: I have personally reviewed following labs and imaging studies.  Initial vitals showed BP 123/83, pulse  111, RR 18, temp 97.9 F, SpO2 100% on room air.  Labs show WBC 10.6, hemoglobin 10.6, platelets 377,000, sodium 135, potassium 3.6, bicarb 28, BUN 24, creatinine 1.28, serum glucose 104, lipase 47.  Abdominal x-ray are negative for acute pathology.  EDP spoke with oncology Dr. [name] who recommended NG tube placement and medical admission.  GI Dr. [name] also notified and has seen in consultation.  Patient was given 1 L normal saline and IV Phenergan.  NG tube placed and seen in position on follow-up x-ray.  Hospitalist service was consulted to admit for further evaluation and management  Review of Systems: All systems reviewed and are negative except as documented in history of present illness above.   Past Medical History:  Diagnosis Date   AKI (acute kidney injury) (HCC)    History of   Ascites    09/2021 paracentesis with almost 3 L of fluid 12/2021 Paracentesis performed with 2.8 L   Carpal tunnel syndrome 02/11/2014   Chronic anemia 08/29/2012   Need colonoscopy or report. Need iron panel.    Dyslipidemia 06/30/2007   Dyspnea    Essential hypertension, benign 06/30/2007   GERD 06/30/2007   History of blood transfusion    History of fatty infiltration of liver    History of migraine    Hx of Herpes simplex meningitis 2015   Also noted to have primary empty sella on imaging at this admission   Insomnia 06/11/2012   Major depressive disorder, recurrent episode, moderate with anxious distress (HCC) 06/30/2007   Nausea and vomiting 01/13/2022   Obesity, BMI 35-40  06/11/2012   Peripheral neuropathy 2/2 T2DM 12/28/2009   Primary empty sella syndrome (HCC) 2015   Noted on imaging during hospitalization for herpes meningitis; no pituitary mass, no hormone w/u at that time, hormonally asymptomatic   Type 2 diabetes mellitus with neurological complications (HCC) 06/30/2007    Past Surgical History:  Procedure Laterality Date   COLONOSCOPY W/ POLYPECTOMY  06/28/2004   DENTAL SURGERY      DILATATION & CURETTAGE/HYSTEROSCOPY WITH MYOSURE N/A 01/15/2022   Procedure: DILATATION & CURETTAGE/HYSTEROSCOPY WITH MYOSURE;  Surgeon: Carver Fila, MD;  Location: [facility];  Service: Gynecology;  Laterality: N/A;  DO NOT OPEN HYSTEROSCOPY KIT   HYSTERECTOMY ABDOMINAL WITH SALPINGO-OOPHORECTOMY Bilateral 04/09/2022   Procedure: HYSTERECTOMY ABDOMINAL BILATERAL SALPINGO OOPHORECTOMY WITH OMENTECTOMY ,DEBULKING;  Surgeon: Carver Fila, MD;  Location: [facility];  Service: Gynecology;  Laterality: Bilateral;   IR IMAGING GUIDED PORT INSERTION  01/18/2022   LAPAROSCOPY N/A 04/09/2022   Procedure: LAPAROSCOPY DIAGNOSTIC;  Surgeon: Carver Fila, MD;  Location: [facility];  Service: Gynecology;  Laterality: N/A;   OPERATIVE ULTRASOUND N/A 01/15/2022   Procedure: OPERATIVE ULTRASOUND;  Surgeon: Carver Fila, MD;  Location: [facility];  Service: Gynecology;  Laterality: N/A;   port a cath placement     TUBAL LIGATION     age 54    Social History:  reports that she quit smoking about 44 years ago. Her smoking use included cigarettes. She started smoking about 54 years ago. She has a 2.5 pack-year smoking history. She has never used smokeless tobacco. She reports that she does not drink alcohol and does not use drugs.  Allergies  Allergen Reactions   Codeine Sulfate Nausea And Vomiting and Other (See Comments)    Dizziness   Penicillins Nausea And Vomiting   Percocet [Oxycodone-Acetaminophen] Nausea And Vomiting   Pork-Derived Products Other (See Comments)    Religious reasons   Zestril [Lisinopril] Cough    Family History  Adopted: Yes  Problem Relation Age of Onset   Dementia Mother    Breast cancer Sister        paternal half-sister   Liver cancer Brother        maternal half-brother   Thyroid cancer Maternal Aunt    Prostate cancer Maternal Uncle    Prostate cancer Cousin    Breast cancer Other    Ovarian cancer Neg Hx    Colon cancer Neg Hx    Endometrial cancer  Neg Hx    Pancreatic cancer Neg Hx      Prior to Admission medications   Medication Sig Start Date End Date Taking? Authorizing Provider  acetaminophen (TYLENOL) 500 MG tablet Take 1,000 mg by mouth every 6 (six) hours as needed for mild pain.   Yes [provider]  albuterol (VENTOLIN HFA) 108 (90 Base) MCG/ACT inhaler Inhale 2 puffs into the lungs every 4 (four) hours as needed for wheezing or shortness of breath. 12/31/21  Yes Glade Lloyd, MD  Biotin 1000 MCG CHEW Chew 1,000 mcg by mouth daily.   Yes [provider]  Cholecalciferol (VITAMIN D3) 25 MCG (1000 UT) CAPS Take 1,000 Units by mouth daily with breakfast.   Yes [provider]  diltiazem (CARDIZEM CD) 120 MG 24 hr capsule Take 1 capsule (120 mg total) by mouth daily. 02/20/23  Yes [name], MD  dorzolamide-timolol (COSOPT) 22.3-6.8 MG/ML ophthalmic solution Place 1 drop into both eyes in the morning and at bedtime. 12/15/21  Yes [provider]  hydrALAZINE (APRESOLINE) 25 MG tablet TAKE 1 TABLET BY MOUTH THREE TIMES DAILY 01/03/23  Yes [name], MD  HYDROmorphone (DILAUDID) 2 MG tablet Take 1 tablet (2 mg) by mouth 3 times daily as needed for severe pain. 06/20/23  Yes Gorsuch, Ni, MD  latanoprost (XALATAN) 0.005 % ophthalmic solution Place 1 drop into both eyes at bedtime. 12/15/21  Yes [provider]  lidocaine-prilocaine (EMLA) cream Apply to affected area once as directed Patient taking differently: Apply 1 Application topically See admin instructions. Apply to affected area as directed for port access 01/31/23  Yes Gorsuch, Ni, MD  losartan (COZAAR) 100 MG tablet Take 1 tablet (100 mg total) by mouth at bedtime. 02/17/23 02/12/24 Yes [name], MD  magnesium oxide (MAG-OX) 400 (240 Mg) MG tablet Take 1 tablet (400 mg total) by mouth 2 (two) times daily. 03/21/23  Yes Gorsuch, Ni, MD  metFORMIN (GLUCOPHAGE) 1000 MG tablet Take 1 tablet (1,000 mg total) by  mouth 2 (two) times daily. 08/28/22 08/28/23 Yes Atway, Rayann N, DO  methylPREDNISolone (MEDROL DOSEPAK) 4 MG TBPK tablet Take 6 tablets by mouth on day 1 then decrease by 1 tablet daily until finished (6-5-4-3-2-1) Patient taking differently: Take 4-24 mg by mouth See admin instructions. Take 24 mg (6 tablets) by mouth on day 1 then decrease by 4 mg daily (1 tablet) daily until finished (6-5-4-3-2-1) 07/07/23  Yes Walisiewicz, Kaitlyn E, PA-C  ondansetron (ZOFRAN) 8 MG tablet Take 1 tablet (8 mg total) by mouth every 8 (eight) hours as needed for nausea or vomiting. Start on the third day after chemotherapy. 01/31/23  Yes Gorsuch, Ni, MD  pantoprazole (PROTONIX) 40 MG tablet Take 1 tablet by mouth daily. Patient taking differently: Take 40 mg by mouth daily before breakfast. 02/20/23 02/15/24 Yes [name], MD  polyethylene glycol powder (GLYCOLAX/MIRALAX) 17 GM/SCOOP powder Take 17 g by mouth daily. 03/28/22  Yes Cross, Melissa D, NP  prochlorperazine (COMPAZINE) 10 MG tablet Take 1 tablet (10 mg total) by mouth every 6 (six) hours as needed for nausea or vomiting. 01/31/23  Yes Artis Delay, MD  promethazine (PHENERGAN) 25 MG suppository Place 1 suppository (25 mg total) rectally every 6 (six) hours as needed for up to 12 days for nausea or vomiting. 07/12/23 07/24/23 Yes Young, Harmon Dun, DO  rivaroxaban (XARELTO) 20 MG TABS tablet TAKE 1 TABLET BY MOUTH ONCE DAILY WITH SUPPER 06/20/23  Yes [name], MD  TUMS 500 MG chewable tablet Chew 1 tablet by mouth 3 (three) times daily as needed for indigestion or heartburn.   Yes [provider]  atorvastatin (LIPITOR) 20 MG tablet Take 1 tablet (20 mg total) by mouth at bedtime. Patient not taking: Reported on 07/24/2023 08/28/22 08/28/23  Atway, Rayann N, DO  Blood Glucose Monitoring Suppl (ONE TOUCH ULTRA MINI) w/Device KIT Please use as directed. 12/20/16   Beather Arbour, MD  Cholecalciferol 1000 units capsule Take 1 capsule (1,000 Units  total) by mouth daily. Patient not taking: Reported on 07/02/2023 11/08/16   Valentino Nose, MD  glucose blood (ONE TOUCH ULTRA TEST) test strip Use as instructed 12/20/16   Beather Arbour, MD  Insulin Pen Needle 32G X 4 MM MISC 1 each by Does not apply route daily. 09/01/19   Lorenso Courier, MD  levothyroxine (SYNTHROID) 50 MCG tablet Take 1 tablet (50 mcg total) by mouth daily before breakfast. Patient not taking: Reported on 07/15/2023 06/20/23   Artis Delay, MD  [facility]  LANCETS 33G MISC Please use as directed. 12/20/16   Beather Arbour, MD    Physical Exam: Vitals:   07-30-2023 1244 July 30, 2023 1609 July 30, 2023 1718 Jul 30, 2023 2026  BP: 123/83 (!) 147/69 (!) 152/64 (!) 143/61  Pulse: (!) 111 82 91 92  Resp: 18 18 18 16   Temp: 97.9 F (36.6 C) 98 F (36.7 C) 99 F (37.2 C) 98.3 F (36.8 C)  TempSrc:  Oral Oral Oral  SpO2: 100% 100% 97% 96%   Constitutional: Resting in bed, NAD, calm, comfortable Eyes: EOMI, lids and conjunctivae normal ENMT: NG tube in place right naris, thick yellow output.  Mucous membranes are dry.  Neck: normal, supple, no masses. Respiratory: clear to auscultation bilaterally, no wheezing, no crackles. Normal respiratory effort. No accessory muscle use.  Cardiovascular: Regular rate and rhythm, no murmurs / rubs / gallops. No extremity edema. 2+ pedal pulses. Abdomen: no tenderness, no masses palpated.  Musculoskeletal: no clubbing / cyanosis. No joint deformity upper and lower extremities. Good ROM, no contractures. Normal muscle tone.  Skin: no rashes, lesions, ulcers. No induration Neurologic: Sensation intact. Strength 5/5 in all 4.  Psychiatric: Normal judgment and insight. Alert and oriented x 3. Normal mood.   EKG: Not performed.  Assessment/Plan Principal Problem:   Gastric outlet obstruction Active Problems:   Uterine cancer (HCC)   Essential hypertension   Type 2 diabetes mellitus with neurological complications (HCC)   PAF (paroxysmal atrial  fibrillation) (HCC) onset post-op Spring 2023   TRISHANA HANAN is a 72 y.o. female with medical history significant for Advanced uterine cancer (s/p TAH, b/l SOO) with peritoneal carcinomatosis on active chemotherapy/immunotherapy, PAF on Xarelto, T2DM, HTN who is admitted with intractable nausea and vomiting possibly due to malignant stricture of proximal small bowel.  NG tube placed, GI and oncology consulting.  Assessment and Plan: Intractable nausea and vomiting/possible malignant stricture of proximal small bowel: NG tube placed with significant improvement in symptoms. -GI Dr. [name] following, may need upper endoscopy -Keep n.p.o. -Continue NG tube to low remittent suction -Continue IV fluid hydration overnight -IV antiemetics and analgesics as needed  Uterine cancer: Follows with oncology, Dr. [name].  On active immunotherapy.  Concern for malignant stricture of proximal small bowel as above as well as recent CT concerning for possible residual peritoneal tumor. -Oncology to follow  Acute kidney injury: Mild due to the poor oral intake and GI losses.  Continue IV fluid hydration and follow labs.  Paroxysmal atrial fibrillation: Remains in sinus rhythm with controlled rate.  Diltiazem on hold while NPO.  Xarelto on hold in case she needs upper endoscopy with biopsy.  Type 2 diabetes: Hold home meds and placed on SSI q4h while NPO.  Hypertension: BP stable, holding home meds while NPO.  Use IV hydralazine as needed.   DVT prophylaxis: SCDs Start: 07-30-2023 2156 Code Status: Full code, confirmed with patient on admission Family Communication: Discussed with patient, she has discussed with family Disposition Plan: From home, dispo pending clinical progress Consults called: GI Dr. [name] Severity of Illness: The appropriate patient status for this patient is INPATIENT. Inpatient status is judged to be reasonable and necessary in order to provide the required intensity of service  to ensure the patient's safety. The patient's presenting symptoms, physical exam findings, and initial radiographic and laboratory data in the context of their chronic comorbidities is felt to place them at high risk for further clinical deterioration. Furthermore, it is not anticipated that the patient will be medically stable for  discharge from the hospital within 2 midnights of admission.   * I certify that at the point of admission it is my clinical judgment that the patient will require inpatient hospital care spanning beyond 2 midnights from the point of admission due to high intensity of service, high risk for further deterioration and high frequency of surveillance required.Darreld Mclean MD [facility]  If 7PM-7AM, please contact night-coverage [email or web address]  07/13/2023, 9:59 PM

## 2023-07-14 NOTE — ED Notes (Signed)
Pt states she would like to wait for bloodwork until port is accessed

## 2023-07-14 NOTE — Telephone Encounter (Signed)
I have reviewed her chart and discussed the case with emergency room physician.  Based on my review of her CT imaging that was done last week, the patient needs to be admitted for NG tube placement, decompression, as well as GI referral in the hospital for evaluation for possible malignant stricture in her duodenum The patient is getting worse over the last few weeks and not improving on conservative approach and hence need to be admitted for further management I will follow and see the patient after she is admitted

## 2023-07-14 NOTE — Consult Note (Signed)
UNASSIGNED PATIENT Reason for Consult: Intractable nausea and vomiting. Referring Physician: [facility] hospital[facility].  Sherri Gill is an 72 y.o. female.  HPI: Sherri Gill is a 72 year old black female with multiple medical problems listed below, who been followed by Dr. Emeline Darling such for uterine cancer.  Patient been having problems with nausea vomiting and decreased oral intake since 30 August this year after she ate some spinach and rice.  She suspects she may have had " food poisoning".  The symptoms have not improved since then.  CT scan of the abdomen pelvis done on 06/17/2023 did not show any obstructive pathology but as of 07/12/2023, the CT scan of the abdomen pelvis on admission revealed worsening of the gastric wall thickening distal duodenal wall thickening and jejunal wall thickening the stomach and the proximal duodenum are distended favoring gastroenteritis there is also some abnormal stranding along the remaining omentum along with abnormal fluid collection along the small bowel mesentery in the lesser sac raising concern for possible peritoneal tumor causing fluid deposits there was some nodularity in the umbilicus nodular enhancement of the left vaginal cuff was noted likely representing tumor along with suspected left iliac lymphadenopathy with mild cardiomegaly scattered colonic diverticula and mild erythematous vascular disease of the aortoiliac tree with lower thoracic and lumbar spondylosis.  Patient claims she has not able to been able to keep any fluids down for the last couple of weeks.  She also suffers from chronic constipation for which she takes Senokot and other OTC laxatives as needed. There is no history of melena hematochezia. She mostly vomits up liquids that she drinks but there is no history of coffee-ground emesis or hematemesis.  Past Medical History:  Diagnosis Date   AKI (acute kidney injury) (HCC)    History of   Ascites    09/2021 paracentesis with almost 3 L of  fluid 12/2021 Paracentesis performed with 2.8 L   Carpal tunnel syndrome 02/11/2014   Chronic anemia 08/29/2012   Need colonoscopy or report. Need iron panel.    Dyslipidemia 06/30/2007   Dyspnea    Essential hypertension, benign 06/30/2007   GERD 06/30/2007   History of blood transfusion    History of fatty infiltration of liver    History of migraine    Hx of Herpes simplex meningitis 2015   Also noted to have primary empty sella on imaging at this admission   Insomnia 06/11/2012   Major depressive disorder, recurrent episode, moderate with anxious distress (HCC) 06/30/2007   Nausea and vomiting 01/13/2022   Obesity, BMI 35-40 06/11/2012   Peripheral neuropathy 2/2 T2DM 12/28/2009   Primary empty sella syndrome (HCC) 2015   Noted on imaging during hospitalization for herpes meningitis; no pituitary mass, no hormone w/u at that time, hormonally asymptomatic   Type 2 diabetes mellitus with neurological complications (HCC) 06/30/2007   Past Surgical History:  Procedure Laterality Date   COLONOSCOPY W/ POLYPECTOMY  06/28/2004   DENTAL SURGERY     DILATATION & CURETTAGE/HYSTEROSCOPY WITH MYOSURE N/A 01/15/2022   Procedure: DILATATION & CURETTAGE/HYSTEROSCOPY WITH MYOSURE;  Surgeon: Carver Fila, MD;  Location: [facility];  Service: Gynecology;  Laterality: N/A;  DO NOT OPEN HYSTEROSCOPY KIT   HYSTERECTOMY ABDOMINAL WITH SALPINGO-OOPHORECTOMY Bilateral 04/09/2022   Procedure: HYSTERECTOMY ABDOMINAL BILATERAL SALPINGO OOPHORECTOMY WITH OMENTECTOMY ,DEBULKING;  Surgeon: Carver Fila, MD;  Location: [facility];  Service: Gynecology;  Laterality: Bilateral;   IR IMAGING GUIDED PORT INSERTION  01/18/2022   LAPAROSCOPY N/A 04/09/2022   Procedure: LAPAROSCOPY DIAGNOSTIC;  Surgeon: Carver Fila, MD;  Location: [facility];  Service: Gynecology;  Laterality: N/A;   OPERATIVE ULTRASOUND N/A 01/15/2022   Procedure: OPERATIVE ULTRASOUND;  Surgeon: Carver Fila, MD;  Location: [facility];   Service: Gynecology;  Laterality: N/A;   port a cath placement     TUBAL LIGATION     age 86    Family History  Adopted: Yes  Problem Relation Age of Onset   Dementia Mother    Breast cancer Sister        paternal half-sister   Liver cancer Brother        maternal half-brother   Thyroid cancer Maternal Aunt    Prostate cancer Maternal Uncle    Prostate cancer Cousin    Breast cancer Other    Ovarian cancer Neg Hx    Colon cancer Neg Hx    Endometrial cancer Neg Hx    Pancreatic cancer Neg Hx     Social History:  reports that she quit smoking about 44 years ago. Her smoking use included cigarettes. She started smoking about 54 years ago. She has a 2.5 pack-year smoking history. She has never used smokeless tobacco. She reports that she does not drink alcohol and does not use drugs.  Allergies:  Allergies  Allergen Reactions   Codeine Sulfate Nausea And Vomiting and Other (See Comments)    Dizziness   Penicillins Nausea And Vomiting   Percocet [Oxycodone-Acetaminophen] Nausea And Vomiting   Zestril [Lisinopril] Cough   Medications: I have reviewed the patient's current medications. Prior to Admission:  Medications Prior to Admission  Medication Sig Dispense Refill Last Dose   acetaminophen (TYLENOL) 500 MG tablet Take 1,000 mg by mouth every 6 (six) hours as needed for mild pain.      albuterol (VENTOLIN HFA) 108 (90 Base) MCG/ACT inhaler Inhale 2 puffs into the lungs every 4 (four) hours as needed for wheezing or shortness of breath. 8 g 0    atorvastatin (LIPITOR) 20 MG tablet Take 1 tablet (20 mg total) by mouth at bedtime. 30 tablet 11    B Complex-C (B-COMPLEX WITH VITAMIN C) tablet Take 1 tablet by mouth in the morning.      Blood Glucose Monitoring Suppl (ONE TOUCH ULTRA MINI) w/Device KIT Please use as directed. 1 each 0    calcium carbonate (TUMS - DOSED IN MG ELEMENTAL CALCIUM) 500 MG chewable tablet Chew 1 tablet by mouth 2 (two) times daily.      Cholecalciferol  1000 units capsule Take 1 capsule (1,000 Units total) by mouth daily. 90 capsule 1    diltiazem (CARDIZEM CD) 120 MG 24 hr capsule Take 1 capsule (120 mg total) by mouth daily. 30 capsule 11    dorzolamide-timolol (COSOPT) 22.3-6.8 MG/ML ophthalmic solution Place 1 drop into both eyes 2 (two) times daily.      glucose blood (ONE TOUCH ULTRA TEST) test strip Use as instructed 100 each 1    hydrALAZINE (APRESOLINE) 25 MG tablet TAKE 1 TABLET BY MOUTH THREE TIMES DAILY 90 tablet 3    HYDROmorphone (DILAUDID) 2 MG tablet Take 1 tablet (2 mg) by mouth 3 times daily as needed for severe pain. 30 tablet 0    Insulin Pen Needle 32G X 4 MM MISC 1 each by Does not apply route daily. 100 each 3    latanoprost (XALATAN) 0.005 % ophthalmic solution Place 1 drop into both eyes at bedtime.      levothyroxine (SYNTHROID) 50 MCG tablet Take 1  tablet (50 mcg total) by mouth daily before breakfast. 30 tablet 1    lidocaine-prilocaine (EMLA) cream Apply to affected area once as directed 30 g 3    losartan (COZAAR) 100 MG tablet Take 1 tablet (100 mg total) by mouth at bedtime. 90 tablet 3    magnesium oxide (MAG-OX) 400 (240 Mg) MG tablet Take 1 tablet (400 mg total) by mouth 2 (two) times daily. 60 tablet 1    metFORMIN (GLUCOPHAGE) 1000 MG tablet Take 1 tablet (1,000 mg total) by mouth 2 (two) times daily. 180 tablet 3    methylPREDNISolone (MEDROL DOSEPAK) 4 MG TBPK tablet Take 6 tablets by mouth on day 1 then decrease by 1 tablet daily until finished (6-5-4-3-2-1) 21 tablet 0    ondansetron (ZOFRAN) 8 MG tablet Take 1 tablet (8 mg total) by mouth every 8 (eight) hours as needed for nausea or vomiting. Start on the third day after chemotherapy. 30 tablet 1    ONETOUCH DELICA LANCETS 33G MISC Please use as directed. 100 each 0    pantoprazole (PROTONIX) 40 MG tablet Take 1 tablet by mouth daily. 360 tablet 0    polyethylene glycol powder (GLYCOLAX/MIRALAX) 17 GM/SCOOP powder Take 17 g by mouth daily. 238 g 1     prochlorperazine (COMPAZINE) 10 MG tablet Take 1 tablet (10 mg total) by mouth every 6 (six) hours as needed for nausea or vomiting. 30 tablet 1    promethazine (PHENERGAN) 25 MG suppository Place 1 suppository (25 mg total) rectally every 6 (six) hours as needed for up to 12 days for nausea or vomiting. 12 each 0    rivaroxaban (XARELTO) 20 MG TABS tablet TAKE 1 TABLET BY MOUTH ONCE DAILY WITH SUPPER 90 tablet 3    Scheduled:  promethazine       Continuous: [id number]  Results for orders placed or performed during the hospital encounter of 07/14/23 (from the past 48 hour(s))  Comprehensive metabolic panel     Status: Abnormal   Collection Time: 07/14/23  1:34 PM  Result Value Ref Range   Sodium 135 135 - 145 mmol/L   Potassium 3.6 3.5 - 5.1 mmol/L   Chloride 97 (L) 98 - 111 mmol/L   CO2 28 22 - 32 mmol/L   Glucose, Bld 104 (H) 70 - 99 mg/dL    Comment: Glucose reference range applies only to samples taken after fasting for at least 8 hours.   BUN 24 (H) 8 - 23 mg/dL   Creatinine, Ser 9.14 (H) 0.44 - 1.00 mg/dL   Calcium 9.8 8.9 - 78.2 mg/dL   Total Protein 7.3 6.5 - 8.1 g/dL   Albumin 4.2 3.5 - 5.0 g/dL   AST 10 (L) 15 - 41 U/L   ALT 8 0 - 44 U/L   Alkaline Phosphatase 117 38 - 126 U/L   Total Bilirubin 0.3 0.3 - 1.2 mg/dL   GFR, Estimated 45 (L) >60 mL/min    Comment: (NOTE) Calculated using the CKD-EPI Creatinine Equation (2021)    Anion gap 10 5 - 15    Comment: Performed at [facility], [address], [place], [place] [address]  Lipase, blood     Status: None   Collection Time: 07/14/23  1:34 PM  Result Value Ref Range   Lipase 47 11 - 51 U/L    Comment: Performed at [facility], [address] [place], [place] [address]  CBC with Differential     Status: Abnormal   Collection Time:  07/14/23  1:34 PM  Result Value Ref Range   WBC 10.6 (H) 4.0 - 10.5 K/uL   RBC 3.88 3.87 - 5.11 MIL/uL   Hemoglobin 10.6 (L) 12.0 - 15.0  g/dL   HCT 47.8 (L) 29.5 - 62.1 %   MCV 87.4 80.0 - 100.0 fL   MCH 27.3 26.0 - 34.0 pg   MCHC 31.3 30.0 - 36.0 g/dL   RDW 30.8 (H) 65.7 - 84.6 %   Platelets 377 150 - 400 K/uL   nRBC 0.0 0.0 - 0.2 %   Neutrophils Relative % 68 %   Neutro Abs 7.2 1.7 - 7.7 K/uL   Lymphocytes Relative 20 %   Lymphs Abs 2.2 0.7 - 4.0 K/uL   Monocytes Relative 11 %   Monocytes Absolute 1.1 (H) 0.1 - 1.0 K/uL   Eosinophils Relative 0 %   Eosinophils Absolute 0.0 0.0 - 0.5 K/uL   Basophils Relative 0 %   Basophils Absolute 0.0 0.0 - 0.1 K/uL   Immature Granulocytes 1 %   Abs Immature Granulocytes 0.06 0.00 - 0.07 K/uL    Comment: Performed at [facility], [address], [place], [place] [address]   *Note: Due to a large number of results and/or encounters for the requested time period, some results have not been displayed. A complete set of results can be found in Results Review.    DG Abd Portable 1 View  Result Date: 07/14/2023 CLINICAL DATA:  Nasogastric tube placement. EXAM: PORTABLE ABDOMEN - 1 VIEW COMPARISON:  Abdomen and pelvis CT dated 07/12/2023. FINDINGS: Normal bowel-gas pattern. Interval nasogastric tube with its tip and side hole in the proximal to mid stomach. Lower thoracic spine degenerative changes. IMPRESSION: Nasogastric tube tip and side hole in the proximal to mid stomach. Electronically Signed   By: Beckie Salts M.D.   On: 07/14/2023 17:09   DG Abdomen Acute W/Chest  Result Date: 07/14/2023 CLINICAL DATA:  Vomiting EXAM: DG ABDOMEN ACUTE WITH 1 VIEW CHEST COMPARISON:  07/10/2023. FINDINGS: There is no evidence of dilated bowel loops or free intraperitoneal air. No radiopaque calculi or other significant radiographic abnormality is seen. Heart size and mediastinal contours are within normal limits. Both lungs are clear. There are thoracic degenerative changes. Right-sided Port-A-Cath tip overlies distal SVC. IMPRESSION: Negative abdominal radiographs.  No acute  cardiopulmonary disease. Electronically Signed   By: Layla Maw M.D.   On: 07/14/2023 14:40    Review of Systems  Constitutional:  Positive for appetite change, chills and fatigue. Negative for activity change, diaphoresis, fever and unexpected weight change.  HENT: Negative.    Eyes: Negative.   Cardiovascular: Negative.   Gastrointestinal:  Positive for constipation, nausea and vomiting. Negative for abdominal distention, abdominal pain, anal bleeding, blood in stool, diarrhea and rectal pain.  Endocrine: Negative.   Genitourinary: Negative.   Musculoskeletal:  Positive for arthralgias.  Allergic/Immunologic: Negative.   Neurological:  Positive for weakness.  Hematological: Negative.   Psychiatric/Behavioral: Negative.     Blood pressure (!) 152/64, pulse 91, temperature 99 F (37.2 C), temperature source Oral, resp. rate 18, SpO2 97%. Physical Exam Constitutional:      General: She is not in acute distress.    Appearance: She is not ill-appearing, toxic-appearing or diaphoretic.  HENT:     Head: Normocephalic and atraumatic.     Mouth/Throat:     Mouth: Mucous membranes are dry.  Eyes:     Extraocular Movements: Extraocular movements intact.  Cardiovascular:  Rate and Rhythm: Normal rate and regular rhythm.  Pulmonary:     Effort: Pulmonary effort is normal.     Breath sounds: Normal breath sounds.  Abdominal:     General: There is distension.     Tenderness: There is no abdominal tenderness.  Musculoskeletal:     Cervical back: Normal range of motion and neck supple.  Skin:    General: Skin is warm and dry.  Neurological:     General: No focal deficit present.     Mental Status: She is alert and oriented to person, place, and time.  Psychiatric:        Mood and Affect: Mood normal.        Behavior: Behavior normal.        Thought Content: Thought content normal.        Judgment: Judgment normal.   Assessment/Plan: 1) Intractable nausea and vomiting  possibly due to malignant stricture of the proximal small bowel secondary to metastatic disease; is already feeling better with NG decompression she will need to be hydrated liberally and will need a possible endoscopy for further evaluation of her duodenal stricture 2) Chronic constipation with abdominal bloating and burping-on MiraLAX and Senokot at home. 3) ??Metastatic uterine cancer-her CT done on 914 appears concerning for omental disease. 4) Colonic diverticulosis. 5) Thoracic and lumbar spondylosis.  07/14/2023, 5:20 PM

## 2023-07-14 NOTE — H&P (View-Only) (Signed)
UNASSIGNED PATIENT Reason for Consult: Intractable nausea and vomiting. Referring Physician: [facility] hospitalist.  Sherri Gill is an 72 y.o. female.  HPI: Sherri Gill is a 72 year old black female with multiple medical problems listed below, who been followed by Dr. Emeline Darling such for uterine cancer.  Patient been having problems with nausea vomiting and decreased oral intake since 30 August this year after she ate some spinach and rice.  She suspects she may have had " food poisoning".  The symptoms have not improved since then.  CT scan of the abdomen pelvis done on 06/17/2023 did not show any obstructive pathology but as of 07/12/2023, the CT scan of the abdomen pelvis on admission revealed worsening of the gastric wall thickening distal duodenal wall thickening and jejunal wall thickening the stomach and the proximal duodenum are distended favoring gastroenteritis there is also some abnormal stranding along the remaining omentum along with abnormal fluid collection along the small bowel mesentery in the lesser sac raising concern for possible peritoneal tumor causing fluid deposits there was some nodularity in the umbilicus nodular enhancement of the left vaginal cuff was noted likely representing tumor along with suspected left iliac lymphadenopathy with mild cardiomegaly scattered colonic diverticula and mild erythematous vascular disease of the aortoiliac tree with lower thoracic and lumbar spondylosis.  Patient claims she has not able to been able to keep any fluids down for the last couple of weeks.  She also suffers from chronic constipation for which she takes Senokot and other OTC laxatives as needed. There is no history of melena hematochezia. She mostly vomits up liquids that she drinks but there is no history of coffee-ground emesis or hematemesis.  Past Medical History:  Diagnosis Date   AKI (acute kidney injury) (HCC)    History of   Ascites    09/2021 paracentesis with almost 3 L of  fluid 12/2021 Paracentesis performed with 2.8 L   Carpal tunnel syndrome 02/11/2014   Chronic anemia 08/29/2012   Need colonoscopy or report. Need iron panel.    Dyslipidemia 06/30/2007   Dyspnea    Essential hypertension, benign 06/30/2007   GERD 06/30/2007   History of blood transfusion    History of fatty infiltration of liver    History of migraine    Hx of Herpes simplex meningitis 2015   Also noted to have primary empty sella on imaging at this admission   Insomnia 06/11/2012   Major depressive disorder, recurrent episode, moderate with anxious distress (HCC) 06/30/2007   Nausea and vomiting 01/13/2022   Obesity, BMI 35-40 06/11/2012   Peripheral neuropathy 2/2 T2DM 12/28/2009   Primary empty sella syndrome (HCC) 2015   Noted on imaging during hospitalization for herpes meningitis; no pituitary mass, no hormone w/u at that time, hormonally asymptomatic   Type 2 diabetes mellitus with neurological complications (HCC) 06/30/2007   Past Surgical History:  Procedure Laterality Date   COLONOSCOPY W/ POLYPECTOMY  06/28/2004   DENTAL SURGERY     DILATATION & CURETTAGE/HYSTEROSCOPY WITH MYOSURE N/A 01/15/2022   Procedure: DILATATION & CURETTAGE/HYSTEROSCOPY WITH MYOSURE;  Surgeon: Carver Fila, MD;  Location: [facility];  Service: Gynecology;  Laterality: N/A;  DO NOT OPEN HYSTEROSCOPY KIT   HYSTERECTOMY ABDOMINAL WITH SALPINGO-OOPHORECTOMY Bilateral 04/09/2022   Procedure: HYSTERECTOMY ABDOMINAL BILATERAL SALPINGO OOPHORECTOMY WITH OMENTECTOMY ,DEBULKING;  Surgeon: Carver Fila, MD;  Location: [facility];  Service: Gynecology;  Laterality: Bilateral;   IR IMAGING GUIDED PORT INSERTION  01/18/2022   LAPAROSCOPY N/A 04/09/2022   Procedure: LAPAROSCOPY DIAGNOSTIC;  Surgeon: Carver Fila, MD;  Location: [facility];  Service: Gynecology;  Laterality: N/A;   OPERATIVE ULTRASOUND N/A 01/15/2022   Procedure: OPERATIVE ULTRASOUND;  Surgeon: Carver Fila, MD;  Location: [facility];   Service: Gynecology;  Laterality: N/A;   port a cath placement     TUBAL LIGATION     age 86    Family History  Adopted: Yes  Problem Relation Age of Onset   Dementia Mother    Breast cancer Sister        paternal half-sister   Liver cancer Brother        maternal half-brother   Thyroid cancer Maternal Aunt    Prostate cancer Maternal Uncle    Prostate cancer Cousin    Breast cancer Other    Ovarian cancer Neg Hx    Colon cancer Neg Hx    Endometrial cancer Neg Hx    Pancreatic cancer Neg Hx     Social History:  reports that she quit smoking about 44 years ago. Her smoking use included cigarettes. She started smoking about 54 years ago. She has a 2.5 pack-year smoking history. She has never used smokeless tobacco. She reports that she does not drink alcohol and does not use drugs.  Allergies:  Allergies  Allergen Reactions   Codeine Sulfate Nausea And Vomiting and Other (See Comments)    Dizziness   Penicillins Nausea And Vomiting   Percocet [Oxycodone-Acetaminophen] Nausea And Vomiting   Zestril [Lisinopril] Cough   Medications: I have reviewed the patient's current medications. Prior to Admission:  Medications Prior to Admission  Medication Sig Dispense Refill Last Dose   acetaminophen (TYLENOL) 500 MG tablet Take 1,000 mg by mouth every 6 (six) hours as needed for mild pain.      albuterol (VENTOLIN HFA) 108 (90 Base) MCG/ACT inhaler Inhale 2 puffs into the lungs every 4 (four) hours as needed for wheezing or shortness of breath. 8 g 0    atorvastatin (LIPITOR) 20 MG tablet Take 1 tablet (20 mg total) by mouth at bedtime. 30 tablet 11    B Complex-C (B-COMPLEX WITH VITAMIN C) tablet Take 1 tablet by mouth in the morning.      Blood Glucose Monitoring Suppl (ONE TOUCH ULTRA MINI) w/Device KIT Please use as directed. 1 each 0    calcium carbonate (TUMS - DOSED IN MG ELEMENTAL CALCIUM) 500 MG chewable tablet Chew 1 tablet by mouth 2 (two) times daily.      Cholecalciferol  1000 units capsule Take 1 capsule (1,000 Units total) by mouth daily. 90 capsule 1    diltiazem (CARDIZEM CD) 120 MG 24 hr capsule Take 1 capsule (120 mg total) by mouth daily. 30 capsule 11    dorzolamide-timolol (COSOPT) 22.3-6.8 MG/ML ophthalmic solution Place 1 drop into both eyes 2 (two) times daily.      glucose blood (ONE TOUCH ULTRA TEST) test strip Use as instructed 100 each 1    hydrALAZINE (APRESOLINE) 25 MG tablet TAKE 1 TABLET BY MOUTH THREE TIMES DAILY 90 tablet 3    HYDROmorphone (DILAUDID) 2 MG tablet Take 1 tablet (2 mg) by mouth 3 times daily as needed for severe pain. 30 tablet 0    Insulin Pen Needle 32G X 4 MM MISC 1 each by Does not apply route daily. 100 each 3    latanoprost (XALATAN) 0.005 % ophthalmic solution Place 1 drop into both eyes at bedtime.      levothyroxine (SYNTHROID) 50 MCG tablet Take 1  tablet (50 mcg total) by mouth daily before breakfast. 30 tablet 1    lidocaine-prilocaine (EMLA) cream Apply to affected area once as directed 30 g 3    losartan (COZAAR) 100 MG tablet Take 1 tablet (100 mg total) by mouth at bedtime. 90 tablet 3    magnesium oxide (MAG-OX) 400 (240 Mg) MG tablet Take 1 tablet (400 mg total) by mouth 2 (two) times daily. 60 tablet 1    metFORMIN (GLUCOPHAGE) 1000 MG tablet Take 1 tablet (1,000 mg total) by mouth 2 (two) times daily. 180 tablet 3    methylPREDNISolone (MEDROL DOSEPAK) 4 MG TBPK tablet Take 6 tablets by mouth on day 1 then decrease by 1 tablet daily until finished (6-5-4-3-2-1) 21 tablet 0    ondansetron (ZOFRAN) 8 MG tablet Take 1 tablet (8 mg total) by mouth every 8 (eight) hours as needed for nausea or vomiting. Start on the third day after chemotherapy. 30 tablet 1    ONETOUCH DELICA LANCETS 33G MISC Please use as directed. 100 each 0    pantoprazole (PROTONIX) 40 MG tablet Take 1 tablet by mouth daily. 360 tablet 0    polyethylene glycol powder (GLYCOLAX/MIRALAX) 17 GM/SCOOP powder Take 17 g by mouth daily. 238 g 1     prochlorperazine (COMPAZINE) 10 MG tablet Take 1 tablet (10 mg total) by mouth every 6 (six) hours as needed for nausea or vomiting. 30 tablet 1    promethazine (PHENERGAN) 25 MG suppository Place 1 suppository (25 mg total) rectally every 6 (six) hours as needed for up to 12 days for nausea or vomiting. 12 each 0    rivaroxaban (XARELTO) 20 MG TABS tablet TAKE 1 TABLET BY MOUTH ONCE DAILY WITH SUPPER 90 tablet 3    Scheduled:  promethazine       Continuous: [id number]  Results for orders placed or performed during the hospital encounter of 07/14/23 (from the past 48 hour(s))  Comprehensive metabolic panel     Status: Abnormal   Collection Time: 07/14/23  1:34 PM  Result Value Ref Range   Sodium 135 135 - 145 mmol/L   Potassium 3.6 3.5 - 5.1 mmol/L   Chloride 97 (L) 98 - 111 mmol/L   CO2 28 22 - 32 mmol/L   Glucose, Bld 104 (H) 70 - 99 mg/dL    Comment: Glucose reference range applies only to samples taken after fasting for at least 8 hours.   BUN 24 (H) 8 - 23 mg/dL   Creatinine, Ser 9.14 (H) 0.44 - 1.00 mg/dL   Calcium 9.8 8.9 - 78.2 mg/dL   Total Protein 7.3 6.5 - 8.1 g/dL   Albumin 4.2 3.5 - 5.0 g/dL   AST 10 (L) 15 - 41 U/L   ALT 8 0 - 44 U/L   Alkaline Phosphatase 117 38 - 126 U/L   Total Bilirubin 0.3 0.3 - 1.2 mg/dL   GFR, Estimated 45 (L) >60 mL/min    Comment: (NOTE) Calculated using the CKD-EPI Creatinine Equation (2021)    Anion gap 10 5 - 15    Comment: Performed at [facility], [address], [place], [place] [address]  Lipase, blood     Status: None   Collection Time: 07/14/23  1:34 PM  Result Value Ref Range   Lipase 47 11 - 51 U/L    Comment: Performed at [facility], [address] [place], [place] [address]  CBC with Differential     Status: Abnormal   Collection Time:  07/14/23  1:34 PM  Result Value Ref Range   WBC 10.6 (H) 4.0 - 10.5 K/uL   RBC 3.88 3.87 - 5.11 MIL/uL   Hemoglobin 10.6 (L) 12.0 - 15.0  g/dL   HCT 47.8 (L) 29.5 - 62.1 %   MCV 87.4 80.0 - 100.0 fL   MCH 27.3 26.0 - 34.0 pg   MCHC 31.3 30.0 - 36.0 g/dL   RDW 30.8 (H) 65.7 - 84.6 %   Platelets 377 150 - 400 K/uL   nRBC 0.0 0.0 - 0.2 %   Neutrophils Relative % 68 %   Neutro Abs 7.2 1.7 - 7.7 K/uL   Lymphocytes Relative 20 %   Lymphs Abs 2.2 0.7 - 4.0 K/uL   Monocytes Relative 11 %   Monocytes Absolute 1.1 (H) 0.1 - 1.0 K/uL   Eosinophils Relative 0 %   Eosinophils Absolute 0.0 0.0 - 0.5 K/uL   Basophils Relative 0 %   Basophils Absolute 0.0 0.0 - 0.1 K/uL   Immature Granulocytes 1 %   Abs Immature Granulocytes 0.06 0.00 - 0.07 K/uL    Comment: Performed at [facility], [address], [place], [place] [address]   *Note: Due to a large number of results and/or encounters for the requested time period, some results have not been displayed. A complete set of results can be found in Results Review.    DG Abd Portable 1 View  Result Date: 07/14/2023 CLINICAL DATA:  Nasogastric tube placement. EXAM: PORTABLE ABDOMEN - 1 VIEW COMPARISON:  Abdomen and pelvis CT dated 07/12/2023. FINDINGS: Normal bowel-gas pattern. Interval nasogastric tube with its tip and side hole in the proximal to mid stomach. Lower thoracic spine degenerative changes. IMPRESSION: Nasogastric tube tip and side hole in the proximal to mid stomach. Electronically Signed   By: Beckie Salts M.D.   On: 07/14/2023 17:09   DG Abdomen Acute W/Chest  Result Date: 07/14/2023 CLINICAL DATA:  Vomiting EXAM: DG ABDOMEN ACUTE WITH 1 VIEW CHEST COMPARISON:  07/10/2023. FINDINGS: There is no evidence of dilated bowel loops or free intraperitoneal air. No radiopaque calculi or other significant radiographic abnormality is seen. Heart size and mediastinal contours are within normal limits. Both lungs are clear. There are thoracic degenerative changes. Right-sided Port-A-Cath tip overlies distal SVC. IMPRESSION: Negative abdominal radiographs.  No acute  cardiopulmonary disease. Electronically Signed   By: Layla Maw M.D.   On: 07/14/2023 14:40    Review of Systems  Constitutional:  Positive for appetite change, chills and fatigue. Negative for activity change, diaphoresis, fever and unexpected weight change.  HENT: Negative.    Eyes: Negative.   Cardiovascular: Negative.   Gastrointestinal:  Positive for constipation, nausea and vomiting. Negative for abdominal distention, abdominal pain, anal bleeding, blood in stool, diarrhea and rectal pain.  Endocrine: Negative.   Genitourinary: Negative.   Musculoskeletal:  Positive for arthralgias.  Allergic/Immunologic: Negative.   Neurological:  Positive for weakness.  Hematological: Negative.   Psychiatric/Behavioral: Negative.     Blood pressure (!) 152/64, pulse 91, temperature 99 F (37.2 C), temperature source Oral, resp. rate 18, SpO2 97%. Physical Exam Constitutional:      General: She is not in acute distress.    Appearance: She is not ill-appearing, toxic-appearing or diaphoretic.  HENT:     Head: Normocephalic and atraumatic.     Mouth/Throat:     Mouth: Mucous membranes are dry.  Eyes:     Extraocular Movements: Extraocular movements intact.  Cardiovascular:  Rate and Rhythm: Normal rate and regular rhythm.  Pulmonary:     Effort: Pulmonary effort is normal.     Breath sounds: Normal breath sounds.  Abdominal:     General: There is distension.     Tenderness: There is no abdominal tenderness.  Musculoskeletal:     Cervical back: Normal range of motion and neck supple.  Skin:    General: Skin is warm and dry.  Neurological:     General: No focal deficit present.     Mental Status: She is alert and oriented to person, place, and time.  Psychiatric:        Mood and Affect: Mood normal.        Behavior: Behavior normal.        Thought Content: Thought content normal.        Judgment: Judgment normal.   Assessment/Plan: 1) Intractable nausea and vomiting  possibly due to malignant stricture of the proximal small bowel secondary to metastatic disease; is already feeling better with NG decompression she will need to be hydrated liberally and will need a possible endoscopy for further evaluation of her duodenal stricture 2) Chronic constipation with abdominal bloating and burping-on MiraLAX and Senokot at home. 3) ??Metastatic uterine cancer-her CT done on 914 appears concerning for omental disease. 4) Colonic diverticulosis. 5) Thoracic and lumbar spondylosis.  07/14/2023, 5:20 PM

## 2023-07-14 NOTE — Telephone Encounter (Signed)
Called her to follow up on ER visit. She wanted to be admitted on 9/14 to the hospital but the ER physician did not think that she needed to be admitted. She has been using phenergan suppositories at home. She vomited a large bucket full on emesis yesterday afternoon. Unable to eat today. Told her to per Dr. [name] to go to the ER now to evaluated and admitted. Dr. [name] reviewed CT and she is developing a bowel obstruction. She verbalized understanding and will go the the ER now.

## 2023-07-14 NOTE — ED Triage Notes (Signed)
Pt c/o nonstop vomiting- last episode yesterday evening, started 30th August. States phenargan helps. Seen by oncology, at [facility] for same

## 2023-07-14 NOTE — ED Provider Notes (Signed)
[place] EMERGENCY DEPARTMENT AT [facility] Provider Note   CSN: [id number] Arrival date & time: 07/14/23  1237     History  Chief Complaint  Patient presents with   Emesis    Sherri Gill is a 72 y.o. female.   Emesis Patient presents with nausea and vomiting.  History of uterine cancer with carcinomatosis.  Has had vomiting for the last 2-1/2 weeks now.  Had been seen by oncology.  CT scan done and had showed possible gastritis on the read, however seen in the ER and did tolerate orals.  Outpatient follow-up and patient continues to vomit.  Really not able to eat.  Sent in by Dr. Emeline Darling such, with whom I discussed case who thinks that there potentially is a metastatic disease and stricture or narrowing from it.  Returns since she is continued to vomit despite medicines at home.    Past Medical History:  Diagnosis Date   AKI (acute kidney injury) (HCC)    History of   Ascites    09/2021 paracentesis with almost 3 L of fluid 12/2021 Paracentesis performed with 2.8 L   Carpal tunnel syndrome 02/11/2014   Chronic anemia 08/29/2012   Need colonoscopy or report. Need iron panel.    Dyslipidemia 06/30/2007   Dyspnea    Essential hypertension, benign 06/30/2007   GERD 06/30/2007   History of blood transfusion    History of fatty infiltration of liver    History of migraine    Hx of Herpes simplex meningitis 2015   Also noted to have primary empty sella on imaging at this admission   Insomnia 06/11/2012   Major depressive disorder, recurrent episode, moderate with anxious distress (HCC) 06/30/2007   Nausea and vomiting 01/13/2022   Obesity, BMI 35-40 06/11/2012   Peripheral neuropathy 2/2 T2DM 12/28/2009   Primary empty sella syndrome (HCC) 2015   Noted on imaging during hospitalization for herpes meningitis; no pituitary mass, no hormone w/u at that time, hormonally asymptomatic   Type 2 diabetes mellitus with neurological complications (HCC) 06/30/2007    Home  Medications Prior to Admission medications   Medication Sig Start Date End Date Taking? Authorizing Provider  acetaminophen (TYLENOL) 500 MG tablet Take 1,000 mg by mouth every 6 (six) hours as needed for mild pain.    [provider]  albuterol (VENTOLIN HFA) 108 (90 Base) MCG/ACT inhaler Inhale 2 puffs into the lungs every 4 (four) hours as needed for wheezing or shortness of breath. 12/31/21   Glade Lloyd, MD  atorvastatin (LIPITOR) 20 MG tablet Take 1 tablet (20 mg total) by mouth at bedtime. 08/28/22 08/28/23  Atway, Rayann N, DO  B Complex-C (B-COMPLEX WITH VITAMIN C) tablet Take 1 tablet by mouth in the morning.    [provider]  Blood Glucose Monitoring Suppl (ONE TOUCH ULTRA MINI) w/Device KIT Please use as directed. 12/20/16   Beather Arbour, MD  calcium carbonate (TUMS - DOSED IN MG ELEMENTAL CALCIUM) 500 MG chewable tablet Chew 1 tablet by mouth 2 (two) times daily.    [provider]  Cholecalciferol 1000 units capsule Take 1 capsule (1,000 Units total) by mouth daily. 11/08/16   Valentino Nose, MD  diltiazem (CARDIZEM CD) 120 MG 24 hr capsule Take 1 capsule (120 mg total) by mouth daily. 02/20/23   [name], MD  dorzolamide-timolol (COSOPT) 22.3-6.8 MG/ML ophthalmic solution Place 1 drop into both eyes 2 (two) times daily. 12/15/21   [provider]  glucose blood (ONE  TOUCH ULTRA TEST) test strip Use as instructed 12/20/16   Beather Arbour, MD  hydrALAZINE (APRESOLINE) 25 MG tablet TAKE 1 TABLET BY MOUTH THREE TIMES DAILY 01/03/23   [name], MD  HYDROmorphone (DILAUDID) 2 MG tablet Take 1 tablet (2 mg) by mouth 3 times daily as needed for severe pain. 06/20/23   Artis Delay, MD  Insulin Pen Needle 32G X 4 MM MISC 1 each by Does not apply route daily. 09/01/19   [name], Sherlyn Lees, MD  latanoprost (XALATAN) 0.005 % ophthalmic solution Place 1 drop into both eyes at bedtime. 12/15/21   [provider]  levothyroxine  (SYNTHROID) 50 MCG tablet Take 1 tablet (50 mcg total) by mouth daily before breakfast. 06/20/23   Artis Delay, MD  lidocaine-prilocaine (EMLA) cream Apply to affected area once as directed 01/31/23   Artis Delay, MD  losartan (COZAAR) 100 MG tablet Take 1 tablet (100 mg total) by mouth at bedtime. 02/17/23 02/12/24  [name], MD  magnesium oxide (MAG-OX) 400 (240 Mg) MG tablet Take 1 tablet (400 mg total) by mouth 2 (two) times daily. 03/21/23   Artis Delay, MD  metFORMIN (GLUCOPHAGE) 1000 MG tablet Take 1 tablet (1,000 mg total) by mouth 2 (two) times daily. 08/28/22 08/28/23  Atway, Rayann N, DO  methylPREDNISolone (MEDROL DOSEPAK) 4 MG TBPK tablet Take 6 tablets by mouth on day 1 then decrease by 1 tablet daily until finished (6-5-4-3-2-1) 07/07/23   Walisiewicz, Yvonna Alanis E, PA-C  ondansetron (ZOFRAN) 8 MG tablet Take 1 tablet (8 mg total) by mouth every 8 (eight) hours as needed for nausea or vomiting. Start on the third day after chemotherapy. 01/31/23   Artis Delay, MD  [facility] DELICA LANCETS 33G MISC Please use as directed. 12/20/16   Beather Arbour, MD  pantoprazole (PROTONIX) 40 MG tablet Take 1 tablet by mouth daily. 02/20/23 02/15/24  [name], MD  polyethylene glycol powder (GLYCOLAX/MIRALAX) 17 GM/SCOOP powder Take 17 g by mouth daily. 03/28/22   Cross, Efraim Kaufmann D, NP  prochlorperazine (COMPAZINE) 10 MG tablet Take 1 tablet (10 mg total) by mouth every 6 (six) hours as needed for nausea or vomiting. 01/31/23   Artis Delay, MD  promethazine (PHENERGAN) 25 MG suppository Place 1 suppository (25 mg total) rectally every 6 (six) hours as needed for up to 12 days for nausea or vomiting. 07/12/23 07/24/23  Coral Spikes, DO  rivaroxaban (XARELTO) 20 MG TABS tablet TAKE 1 TABLET BY MOUTH ONCE DAILY WITH SUPPER 06/20/23   [name], MD      Allergies    Codeine sulfate, Penicillins, Percocet [oxycodone-acetaminophen], and Zestril [lisinopril]    Review of Systems   Review of  Systems  Gastrointestinal:  Positive for vomiting.    Physical Exam Updated Vital Signs BP 123/83 (BP Location: Right Arm)   Pulse (!) 111   Temp 97.9 F (36.6 C)   Resp 18   SpO2 100%  Physical Exam Vitals and nursing note reviewed.  Cardiovascular:     Rate and Rhythm: Regular rhythm. Tachycardia present.  Pulmonary:     Comments: Port-A-Cath accessed on right chest wall. Abdominal:     Tenderness: There is abdominal tenderness.     Comments: Mild upper abdominal tenderness without rebound or guarding.  No hernia palpated.  Neurological:     Mental Status: She is alert and oriented to person, place, and time.     ED Results / Procedures / Treatments   Labs (all labs  ordered are listed, but only abnormal results are displayed) Labs Reviewed  COMPREHENSIVE METABOLIC PANEL - Abnormal; Notable for the following components:      Result Value   Chloride 97 (*)    Glucose, Bld 104 (*)    BUN 24 (*)    Creatinine, Ser 1.28 (*)    AST 10 (*)    GFR, Estimated 45 (*)    All other components within normal limits  CBC WITH DIFFERENTIAL/PLATELET - Abnormal; Notable for the following components:   WBC 10.6 (*)    Hemoglobin 10.6 (*)    HCT 33.9 (*)    RDW 16.2 (*)    Monocytes Absolute 1.1 (*)    All other components within normal limits  LIPASE, BLOOD    EKG None  Radiology DG Abdomen Acute W/Chest  Result Date: 07/14/2023 CLINICAL DATA:  Vomiting EXAM: DG ABDOMEN ACUTE WITH 1 VIEW CHEST COMPARISON:  07/10/2023. FINDINGS: There is no evidence of dilated bowel loops or free intraperitoneal air. No radiopaque calculi or other significant radiographic abnormality is seen. Heart size and mediastinal contours are within normal limits. Both lungs are clear. There are thoracic degenerative changes. Right-sided Port-A-Cath tip overlies distal SVC. IMPRESSION: Negative abdominal radiographs.  No acute cardiopulmonary disease. Electronically Signed   By: Layla Maw M.D.   On:  07/14/2023 14:40    Procedures Procedures    Medications Ordered in ED Medications  promethazine (PHENERGAN) 25 MG/ML injection (12.5 mg  Not Given 07/14/23 1446)  sodium chloride 0.9 % bolus 1,000 mL (1,000 mLs Intravenous New Bag/Given 07/14/23 1444)  promethazine (PHENERGAN) 12.5 mg in sodium chloride 0.9 % 50 mL IVPB (12.5 mg Intravenous New Bag/Given 07/14/23 1446)    ED Course/ Medical Decision Making/ A&P                                 Medical Decision Making Amount and/or Complexity of Data Reviewed Labs: ordered. Radiology: ordered.   Patient with nausea and vomiting.  Abdominal pain.  Recently seen for same.  Reviewed notes by oncology and ER visit.  I discussed with oncology on the phone.  Dr. Emeline Darling such recommends NG tube and admission to hospital.  Can go to either [facility] or [facility].  Will be following.  She suspects stricture from cancer as opposed to gastritis that was diagnosed on CT scan.  Patient getting fluid infusion and IV Phenergan.  Will discuss with hospitalist for admission.         Final Clinical Impression(s) / ED Diagnoses Final diagnoses:  Nausea and vomiting, unspecified vomiting type  Dehydration    Rx / DC Orders ED Discharge Orders     None         Benjiman Core, MD 07/14/23 [phone]

## 2023-07-15 DIAGNOSIS — C549 Malignant neoplasm of corpus uteri, unspecified: Secondary | ICD-10-CM | POA: Diagnosis not present

## 2023-07-15 DIAGNOSIS — K311 Adult hypertrophic pyloric stenosis: Secondary | ICD-10-CM | POA: Diagnosis not present

## 2023-07-15 DIAGNOSIS — R933 Abnormal findings on diagnostic imaging of other parts of digestive tract: Secondary | ICD-10-CM | POA: Diagnosis not present

## 2023-07-15 DIAGNOSIS — R112 Nausea with vomiting, unspecified: Secondary | ICD-10-CM | POA: Diagnosis not present

## 2023-07-15 LAB — GLUCOSE, CAPILLARY
Glucose-Capillary: 110 mg/dL — ABNORMAL HIGH (ref 70–99)
Glucose-Capillary: 139 mg/dL — ABNORMAL HIGH (ref 70–99)
Glucose-Capillary: 75 mg/dL (ref 70–99)
Glucose-Capillary: 85 mg/dL (ref 70–99)
Glucose-Capillary: 87 mg/dL (ref 70–99)
Glucose-Capillary: 94 mg/dL (ref 70–99)
Glucose-Capillary: 96 mg/dL (ref 70–99)

## 2023-07-15 LAB — CBC
HCT: 28.8 % — ABNORMAL LOW (ref 36.0–46.0)
Hemoglobin: 8.8 g/dL — ABNORMAL LOW (ref 12.0–15.0)
MCH: 27.3 pg (ref 26.0–34.0)
MCHC: 30.6 g/dL (ref 30.0–36.0)
MCV: 89.4 fL (ref 80.0–100.0)
Platelets: 299 10*3/uL (ref 150–400)
RBC: 3.22 MIL/uL — ABNORMAL LOW (ref 3.87–5.11)
RDW: 16.1 % — ABNORMAL HIGH (ref 11.5–15.5)
WBC: 8.5 10*3/uL (ref 4.0–10.5)
nRBC: 0 % (ref 0.0–0.2)

## 2023-07-15 LAB — BASIC METABOLIC PANEL
Anion gap: 8 (ref 5–15)
BUN: 17 mg/dL (ref 8–23)
CO2: 27 mmol/L (ref 22–32)
Calcium: 8.9 mg/dL (ref 8.9–10.3)
Chloride: 102 mmol/L (ref 98–111)
Creatinine, Ser: 0.97 mg/dL (ref 0.44–1.00)
GFR, Estimated: >60 mL/min (ref >60)
Glucose, Bld: 93 mg/dL (ref 70–99)
Potassium: 3.1 mmol/L — ABNORMAL LOW (ref 3.5–5.1)
Sodium: 137 mmol/L (ref 135–145)

## 2023-07-15 LAB — HIV ANTIBODY (ROUTINE TESTING W REFLEX): HIV Screen 4th Generation wRfx: NONREACTIVE

## 2023-07-15 MED ORDER — LATANOPROST 0.005 % OP SOLN
1.0000 [drp] | Freq: Every day | OPHTHALMIC | Status: DC
  Administered 2023-07-15 – 2023-08-24 (×41): 1 [drp] via OPHTHALMIC
  Filled 2023-07-15 (×3): qty 2.5

## 2023-07-15 MED ORDER — CHLORHEXIDINE GLUCONATE CLOTH 2 % EX PADS
6.0000 | MEDICATED_PAD | Freq: Every day | CUTANEOUS | Status: DC
  Administered 2023-07-15 – 2023-08-25 (×39): 6 via TOPICAL

## 2023-07-15 MED ORDER — DORZOLAMIDE HCL-TIMOLOL MAL 2-0.5 % OP SOLN
1.0000 [drp] | Freq: Two times a day (BID) | OPHTHALMIC | Status: DC
  Administered 2023-07-15 – 2023-08-24 (×80): 1 [drp] via OPHTHALMIC
  Filled 2023-07-15 (×2): qty 10

## 2023-07-15 MED ORDER — DEXTROSE-SODIUM CHLORIDE 5-0.9 % IV SOLN: INTRAVENOUS | Status: DC

## 2023-07-15 NOTE — Progress Notes (Signed)
Chaplain engaged in an initial visit with [place]. Chaplain provided Advanced Directive, Healthcare POA, support and education. Jazmyn desires to assign her daughter and a friend as her healthcare agents but would like to talk with them first. Chaplain let Somer know how to contact her if she desires to complete the document while in the hospital, and showed her the information on how to complete it outside of the hospital.  Lunette Stands also engaged in some narrative life review as Mallorey shared about her healthcare journey and support system. Ivannia voiced that she has great community around her.   Chaplain established relationship and rapport, and will follow-up tomorrow.    07/15/23 1400  Spiritual Encounters  Type of Visit Initial  Care provided to: Patient  Reason for visit Advance directives  Spiritual Framework  Presenting Themes Goals in life/care;Community and relationships;Rituals and practive  Interventions  Spiritual Care Interventions Made Prayer;Compassionate presence;Established relationship of care and support;Decision-making support/facilitation  Intervention Outcomes  Outcomes Connection to spiritual care;Awareness of support

## 2023-07-15 NOTE — Progress Notes (Signed)
PROGRESS NOTE  Sherri Gill  DOB: 11/07/1950  PCP: [name], MD WUJ:[id number]  DOA: 07/15/2023  LOS: 1 day  Hospital Day: 2  Brief narrative: Sherri Gill is a 72 y.o. female with PMH significant for advanced uterine cancer (s/p TAH, b/l SOO) with peritoneal carcinomatosis on active chemotherapy/immunotherapy, PAF on Xarelto, T2DM, HTN, HLD, migraine, anxiety, GERD 9/14, patient presented to the ED with complaint of persistent nausea, vomiting for several days, with poor oral intake, abdominal discomfort. CT A/P showed distention and wall thickening of stomach, proximal duodenum, some abnormal stranding along the remaining omentum and abdominal fluid collections along the small bowel mesentery and in the lesser sac.  This was raising concern for possible residual peritoneal tumor causing fluid deposits.  Patient was able to tolerate food and discharged to home.  Her symptoms continue to worsen at home and hence she returned back to ED on 9/16.  In the ED, patient was afebrile, tachycardic, hemodynamically stable Labs with WC count 10.6, hemoglobin 10.6, BUN/creatinine 24/1.28, lipase 47 Abdominal x-ray was obtained which was negative for acute pathology EDP discussed with oncology Dr. [name] and GI Dr. [name] NG tube was placed after which patient started to feel better Admitted to [facility]  Subjective: Patient was seen and examined this morning.  Pleasant elderly African-American female.  Lying down in bed.  On NG tube suction.  Feels better with decompression.  Passing gas.  No bowel movement.  Last bowel movement was yesterday prior to the presentation. Chart reviewed. Remains hemodynamically stable. Labs this morning with WBC count normal at 8.5, hemoglobin down to 8.8, potassium low at 3.1, creatinine improved to 0.97.  Assessment and plan: Partial small bowel obstruction Presented with intractable nausea and vomiting in the setting of peritoneal carcinomatosis   Suspect possible malignant stricture of proximal small bowel CT abdomen 9/14 as above NG tube placed with significant improvement in symptoms.  Continue NG tube to suction. GI Dr. [name] following, may need upper endoscopy Currently remains n.p.o. Continue IV fluid hydration  Continue IV antiemetics and analgesics PRN  Advanced uterine cancer  (s/p TAH, b/l SOO) with peritoneal carcinomatosis on active chemotherapy/immunotherapy Follows with oncology, Dr. [name].     Acute kidney injury: Mild due to the poor oral intake and GI losses.  Creatinine improving with hydration. Recent Labs    04/11/23 0931 05/02/23 0844 05/30/23 0812 06/20/23 0844 07/04/23 0956 07/07/23 1049 07/10/23 1331 07/12/23 1249 07/09/2023 1334 07/15/23 0320  BUN 15 24* 20 15 13 23 20 19  24* 17  CREATININE 0.89 1.33* 0.92 1.01* 0.81 0.90 0.87 0.82 1.28* 0.97   Paroxysmal atrial fibrillation Remains in sinus rhythm with controlled rate.   PTA meds- Cardizem 120 mg daily, Xarelto. Diltiazem on hold while NPO.  If unable to resume in next 24 hours, will start Cardizem drip Xarelto on hold in case she needs upper endoscopy with biopsy.   Hypertension: PTA meds- losartan 100 mg daily, Cardizem 120 mg daily Currently oral meds on hold IV hydralazine as needed  Type 2 diabetes mellitus A1c 6.4 on April 2024 PTA meds-metformin 1000 mg twice daily, Continue SSI/Accu-Cheks.  Blood sugar level running persistently low.  I will switch her fluids from LR to D5 NS. Recent Labs  Lab 07/15/23 0050 07/15/23 0455 07/15/23 0726  GLUCAP 94 85 75   Anemia chronic disease Vitamin B12 deficiency Hemoglobin at baseline close to 9.  No active bleeding.  Continue to monitor. Resume vitamin B12 supplement when able to. Recent  Labs    03/21/23 0912 03/25/23 1541 07/07/23 1049 07/10/23 1331 07/12/23 1249 Aug 06, 2023 1334 07/15/23 0320  HGB  --    < > 9.9* 10.1* 9.7* 10.6* 8.8*  MCV  --    < > 88.1 86.5 89.7 87.4  89.4  VITAMINB12 127*  --   --   --   --   --   --   FERRITIN 232  --   --   --   --   --   --   TIBC 260  --   --   --   --   --   --   IRON 42  --   --   --   --   --   --    < > = values in this interval not displayed.     Mobility: Independent at baseline.  Continue to encourage ambulation  Goals of care   Code Status: Full Code     DVT prophylaxis:  SCDs Start: Aug 06, 2023 2156   Antimicrobials: None Fluid: D5 NS at 125 mL/h Consultants: Oncology, GI Family Communication: None at bedside  Status: Inpatient Level of care:  Med-Surg   Patient is from: Home Needs to continue in-hospital care: Bowel obstruction Anticipated d/c to: Pending clinical course      Diet:  Diet Order             Diet NPO time specified  Diet effective now                   Scheduled Meds:  Chlorhexidine Gluconate Cloth  6 each Topical Daily   dorzolamide-timolol  1 drop Both Eyes BID   insulin aspart  0-9 Units Subcutaneous Q4H   latanoprost  1 drop Both Eyes QHS    PRN meds: acetaminophen, hydrALAZINE, HYDROmorphone (DILAUDID) injection, metoprolol tartrate, ondansetron (ZOFRAN) IV   Infusions:   dextrose 5 % and 0.9 % NaCl      Antimicrobials: Anti-infectives (From admission, onward)    None       Objective: Vitals:   07/15/23 0048 07/15/23 0453  BP: 134/66 127/61  Pulse: 82 71  Resp: 14 14  Temp: 98.8 F (37.1 C) 98.1 F (36.7 C)  SpO2: 100% 93%    Intake/Output Summary (Last 24 hours) at 07/15/2023 1020 Last data filed at 07/15/2023 4259 Gross per 24 hour  Intake 1070.87 ml  Output 600 ml  Net 470.87 ml   There were no vitals filed for this visit. Weight change:  There is no height or weight on file to calculate BMI.   Physical Exam: General exam: Pleasant elderly African-American female. Skin: No rashes, lesions or ulcers. HEENT: Atraumatic, normocephalic, no obvious bleeding Lungs: Clear to auscultation bilaterally CVS: Regular rate and  rhythm, no murmur GI/Abd soft, distention improved, mild tenderness generalized, bowel sound present but sluggish CNS: Alert, awake, oriented x 3 Psychiatry: Mood appropriate Extremities: No edema, no calf tenderness  Data Review: I have personally reviewed the laboratory data and studies available.  F/u labs ordered Unresulted Labs (From admission, onward)     Start     Ordered   07/17/2023 0500  CBC with Differential/Platelet  Daily,   R     Question:  Specimen collection method  Answer:  Unit=Unit collect   07/15/23 1019   07/07/2023 0500  Basic metabolic panel  Daily,   R     Question:  Specimen collection method  Answer:  Unit=Unit collect   07/15/23 1019  07/05/2023 0500  Magnesium  Tomorrow morning,   R       Question:  Specimen collection method  Answer:  Unit=Unit collect   07/15/23 1019   07/18/2023 0500  Phosphorus  Tomorrow morning,   R       Question:  Specimen collection method  Answer:  Unit=Unit collect   07/15/23 1019   07/15/23 0500  HIV Antibody (routine testing w rflx)  (HIV Antibody (Routine testing w reflex) panel)  Tomorrow morning,   R        07/13/2023 2156            Total time spent in review of labs and imaging, patient evaluation, formulation of plan, documentation and communication with family: 55 minutes  Signed, Lorin Glass, MD [facility] 07/15/2023

## 2023-07-15 NOTE — Progress Notes (Signed)
Sherri Gill   DOB:[birth year]   QI#:[id number]    ASSESSMENT & PLAN:  Recurrent uterine cancer She presents with partial small bowel obstruction Appreciate GI consult Already feeling better with NG tube decompression and IV fluids Need assessment by GI for evaluation for possible malignant stricture with EGD  Nausea, vomiting and dehydration She is feeling better with nothing by mouth, IV fluids and antiemetics as needed  Intermittent thigh pain Cause unknown Recommend heat pad  Anemia chronic disease She is not symptomatic Observe only  Dehydration, mild hypokalemia Likely due to poor oral intake Replace as needed  Discharge planning Unknown She would likely be here for the next few days pending further evaluation  All questions were answered. The patient knows to call the clinic with any problems, questions or concerns.   The total time spent in the appointment was 55 minutes encounter with patients including review of chart and various tests results, discussions about plan of care and coordination of care plan  Artis Delay, MD 07/15/2023 8:06 AM  Subjective:  Patient well-known to me.  She was seen just last week for evaluation for nausea, vomiting and intermittent abdominal pain.  She was not able to keep anything by mouth.  She presented to the emergency room several days ago but was not admitted.  She felt really bad yesterday morning and we directed her back to emergency room for evaluation and admission.  NG tube was placed and she felt immediately better.  Appreciated GI consult.  This morning, she complained of intermittent thigh pain that has been present due to chemotherapy.  Objective:  Vitals:   07/15/23 0048 07/15/23 0453  BP: 134/66 127/61  Pulse: 82 71  Resp: 14 14  Temp: 98.8 F (37.1 C) 98.1 F (36.7 C)  SpO2: 100% 93%     Intake/Output Summary (Last 24 hours) at 07/15/2023 0806 Last data filed at 07/15/2023 3875 Gross per 24 hour  Intake 1070.87  ml  Output 600 ml  Net 470.87 ml    GENERAL:alert, no distress and comfortable.  NG tube in situ, draining bilious drainage SKIN: skin color, texture, turgor are normal, no rashes or significant lesions ABDOMEN:abdomen less distended, compared to prior visit Musculoskeletal:no cyanosis of digits and no clubbing  NEURO: alert & oriented x 3 with fluent speech, no focal motor/sensory deficits   Labs:  Recent Labs    07/10/23 1331 07/12/23 1249 07/28/2023 1334 07/15/23 0320  NA 134* 134* 135 137  K 3.8 2.8* 3.6 3.1*  CL 96* 96* 97* 102  CO2 28 27 28 27   GLUCOSE 105* 110* 104* 93  BUN 20 19 24* 17  CREATININE 0.87 0.82 1.28* 0.97  CALCIUM 10.1 9.6 9.8 8.9  GFRNONAA >60 >60 45* >60  PROT 7.6 7.1 7.3  --   ALBUMIN 4.3 3.9 4.2  --   AST 12* 12* 10*  --   ALT 12 13 8   --   ALKPHOS 128* 108 117  --   BILITOT 0.3 0.9 0.3  --     Studies: I have reviewed her CT imaging from last week DG Abd Portable 1 View  Result Date: 06/29/2023 CLINICAL DATA:  Nasogastric tube placement. EXAM: PORTABLE ABDOMEN - 1 VIEW COMPARISON:  Abdomen and pelvis CT dated 07/12/2023. FINDINGS: Normal bowel-gas pattern. Interval nasogastric tube with its tip and side hole in the proximal to mid stomach. Lower thoracic spine degenerative changes. IMPRESSION: Nasogastric tube tip and side hole in the proximal to mid stomach. Electronically Signed  By: Beckie Salts M.D.   On: 07/05/2023 17:09   DG Abdomen Acute W/Chest  Result Date: 07/12/2023 CLINICAL DATA:  Vomiting EXAM: DG ABDOMEN ACUTE WITH 1 VIEW CHEST COMPARISON:  07/10/2023. FINDINGS: There is no evidence of dilated bowel loops or free intraperitoneal air. No radiopaque calculi or other significant radiographic abnormality is seen. Heart size and mediastinal contours are within normal limits. Both lungs are clear. There are thoracic degenerative changes. Right-sided Port-A-Cath tip overlies distal SVC. IMPRESSION: Negative abdominal radiographs.  No acute  cardiopulmonary disease. Electronically Signed   By: Layla Maw M.D.   On: 07/25/2023 14:40   CT ABDOMEN PELVIS W CONTRAST  Result Date: 07/12/2023 CLINICAL DATA:  Endometrial/uterine cancer.  Nausea and vomiting. * Tracking Code: BO * EXAM: CT ABDOMEN AND PELVIS WITH CONTRAST TECHNIQUE: Multidetector CT imaging of the abdomen and pelvis was performed using the standard protocol following bolus administration of intravenous contrast. RADIATION DOSE REDUCTION: This exam was performed according to the departmental dose-optimization program which includes automated exposure control, adjustment of the mA and/or kV according to patient size and/or use of iterative reconstruction technique. CONTRAST:  OMNIPAQUE IOHEXOL 300 MG/ML  SOLN COMPARISON:  CT abdomen/pelvis from 06/17/2023 FINDINGS: Lower chest: A catheter terminates in the right atrium on the top most image. Mild cardiomegaly. Hepatobiliary: Unremarkable Pancreas: Unremarkable Spleen: Unremarkable Adrenals/Urinary Tract: Unremarkable Stomach/Bowel: Diffuse gastric wall thickening with mild gastric and substantial proximal duodenal distension. After crossing the midline, the duodenum does not appear distended but there is substantial distal duodenal and jejunal wall thickening, worsened from prior exams. Scattered colonic diverticula. Appendix unremarkable. No pneumatosis. No hypoenhancement of bowel wall. There are abnormal contains small fluid collections along the small bowel mesentery, for example along the left abdomen on image 64 series 7 and along the right abdomen on image 55 series 7. In addition there is fluid density in the lesser sac on image 76 series 7. Vascular/Lymphatic: Mild atheromatous vascular disease of the aortoiliac tree. Patent celiac trunk and SMA. Patent superior mesenteric vein. Suspected left iliac adenopathy measuring about 1.3 cm in diameter on image 47 series 2, stable. This is of similar density to the adjacent vein.  Reproductive: Uterus absent. Nodular enhancement along the left vaginal cuff measuring 2.5 by 1.7 cm, previously 2.3 by 1.7 cm by my measurements. Tumor in this location is not excluded. Other: There is some abnormal stranding along the remaining omentum for example on image 73 series 9. Small nodular deposit along the upper omentum on image 31 series 2. Musculoskeletal: Lower thoracic and lumbar spondylosis. Density within the umbilicus on image 102 series 9, potentially a small knuckle of bowel versus tumor. IMPRESSION: 1. Worsening of gastric wall thickening, distal duodenal wall thickening, and jejunal wall thickening. The stomach and proximal duodenum are distended. The appearance favors gastroenteritis. 2. There is also some abnormal stranding along the remaining omentum. There are also abnormal fluid collections along the small bowel mesentery and in the lesser sac. The appearance raises concern for possible residual peritoneal tumor causing fluid deposits. Small nodular deposit along the upper omentum along with nodularity in the umbilicus. 3. Nodular enhancement along the left vaginal cuff measuring 2.5 by 1.7 cm, previously 2.3 by 1.7 cm by my measurements. This likely represents tumor. 4. Suspected left iliac adenopathy measuring about 1.3 cm in diameter, stable. 5. Mild cardiomegaly. 6. Scattered colonic diverticula. 7. Mild atheromatous vascular disease of the aortoiliac tree. 8. Lower thoracic and lumbar spondylosis. Electronically Signed   By: Zollie Beckers  Ova Freshwater M.D.   On: 07/12/2023 14:56   DG Abd 1 View  Result Date: 07/10/2023 CLINICAL DATA:  Abdominal pain, vomiting. History of uterine cancer. EXAM: ABDOMEN - 1 VIEW COMPARISON:  April 09, 2022. FINDINGS: The bowel gas pattern is normal. Surgical clip is seen over the left sacrum. No radio-opaque calculi or other significant radiographic abnormality are seen. IMPRESSION: No abnormal bowel dilatation. Electronically Signed   By: Lupita Raider  M.D.   On: 07/10/2023 14:18   CT CHEST ABDOMEN PELVIS W CONTRAST  Result Date: 06/17/2023 CLINICAL DATA:  History of endometrial cancer/uterine cancer. Evaluate treatment response to chemotherapy. * Tracking Code: BO *. EXAM: CT CHEST, ABDOMEN, AND PELVIS WITH CONTRAST TECHNIQUE: Multidetector CT imaging of the chest, abdomen and pelvis was performed following the standard protocol during bolus administration of intravenous contrast. RADIATION DOSE REDUCTION: This exam was performed according to the departmental dose-optimization program which includes automated exposure control, adjustment of the mA and/or kV according to patient size and/or use of iterative reconstruction technique. CONTRAST:  OMNIPAQUE IOHEXOL 300 MG/ML  SOLN COMPARISON:  Multiple priors including most recent CT April 08, 2023 FINDINGS: CT CHEST FINDINGS Cardiovascular: Right chest Port-A-Cath with tip in the right atrium. Aortic atherosclerosis. No central pulmonary embolus on this nondedicated study. Normal size heart. No significant pericardial effusion/thickening. Mediastinum/Nodes: No suspicious thyroid nodule. No pathologically enlarged mediastinal, hilar or axillary lymph nodes. Right paratracheal lymph node now measures 5 mm in short axis on image 17/2 previously 7 mm prevascular lymph node now measures 3 mm in short axis on image 20/2 previously 5 mm the esophagus is grossly unremarkable. Lungs/Pleura: No suspicious pulmonary nodules or masses. No pleural effusion. No pneumothorax. Musculoskeletal: Similar appearance of the sclerotic lesions in the T2 and T8 vertebral bodies. No new aggressive lytic or blastic lesion of bone. CT ABDOMEN PELVIS FINDINGS Hepatobiliary: No suspicious hepatic lesion. Gallbladder is unremarkable. No biliary ductal dilation. Pancreas: No pancreatic ductal dilation or evidence of acute inflammation Spleen: No splenomegaly. Adrenals/Urinary Tract: Bilateral adrenal glands appear normal. No  hydronephrosis. Kidneys demonstrate symmetric enhancement. Urinary bladder is minimally distended limiting evaluation Stomach/Bowel: No radiopaque enteric contrast material was administered. Similar asymmetric thickening of the anterior gastric antrum measuring 15 mm on image 64/2, unchanged. No pathologic dilation of small or large bowel. Colonic diverticulosis without findings of acute diverticulitis. Vascular/Lymphatic: Aortic atherosclerosis. Normal caliber abdominal aorta. Smooth IVC contours. The portal, splenic and superior mesenteric veins are patent. Left external iliac lymph node measures 9 mm in short axis on image 102/2 previously 11 mm. -left obturator lymph node measures 2 mm in short axis on image 99/2 previously 4 mm. Reproductive: Uterus is surgically absent. Nodular soft tissue along the left side of the vaginal cuff now measures 2.1 x 1.8 cm on image 98/2 previously 3.3 x 2.4 cm. Other: Peritoneal/omental thickening and nodularity is decreased from prior. For instance in the central abdomen subjacent to the umbilicus nodular focus measures 10 mm on image 79/2 previously 2.2 cm. No significant abdominopelvic free fluid. Musculoskeletal: No new suspicious lytic or blastic lesion of bone. Multilevel degenerative changes spine. IMPRESSION: 1. Findings consistent with treatment response including decreased size of the nodular soft tissue along the left side of the vaginal cuff, decreased size of the peritoneal/omental thickening and nodularity, and decreased size of the mediastinal, left external iliac and left obturator lymph nodes. 2. Similar appearance of the sclerotic lesions in the T2 and T8 vertebral bodies. No new suspicious osseous lesions. 3. Similar  asymmetric thickening of the anterior gastric antrum measuring 15 mm, unchanged. 4.  Aortic Atherosclerosis (ICD10-I70.0). Electronically Signed   By: Maudry Mayhew M.D.   On: 06/17/2023 14:46

## 2023-07-15 NOTE — Progress Notes (Signed)
Subjective: Her abdomen feels better, but her nasopharyngeal region hurts.  Objective: Vital signs in last 24 hours: Temp:  [98 F (36.7 C)-99 F (37.2 C)] 98 F (36.7 C) (09/17 1406) Pulse Rate:  [71-92] 71 (09/17 1406) Resp:  [14-18] 14 (09/17 0453) BP: (127-152)/(61-67) 134/67 (09/17 1406) SpO2:  [93 %-100 %] 93 % (09/17 1406) Last BM Date : 07/13/23  Intake/Output from previous day: 09/16 0701 - 09/17 0700 In: 1070.9 [I.V.:930.9; NG/GT:90; IV Piggyback:50] Out: 600 [Emesis/NG output:600] Intake/Output this shift: Total I/O In: -  Out: 100 [Emesis/NG output:100]  General appearance: uncomfortable GI: soft, non-tender; bowel sounds normal; no masses,  no organomegaly  Lab Results: Recent Labs    07/14/23 1334 07/15/23 0320  WBC 10.6* 8.5  HGB 10.6* 8.8*  HCT 33.9* 28.8*  PLT 377 299   BMET Recent Labs    07/14/23 1334 07/15/23 0320  NA 135 137  K 3.6 3.1*  CL 97* 102  CO2 28 27  GLUCOSE 104* 93  BUN 24* 17  CREATININE 1.28* 0.97  CALCIUM 9.8 8.9   LFT Recent Labs    07/14/23 1334  PROT 7.3  ALBUMIN 4.2  AST 10*  ALT 8  ALKPHOS 117  BILITOT 0.3   PT/INR No results for input(s): "LABPROT", "INR" in the last 72 hours. Hepatitis Panel No results for input(s): "HEPBSAG", "HCVAB", "HEPAIGM", "HEPBIGM" in the last 72 hours. C-Diff No results for input(s): "CDIFFTOX" in the last 72 hours. Fecal Lactopherrin No results for input(s): "FECLLACTOFRN" in the last 72 hours.  Studies/Results: DG Abd Portable 1 View  Result Date: 07/14/2023 CLINICAL DATA:  Nasogastric tube placement. EXAM: PORTABLE ABDOMEN - 1 VIEW COMPARISON:  Abdomen and pelvis CT dated 07/12/2023. FINDINGS: Normal bowel-gas pattern. Interval nasogastric tube with its tip and side hole in the proximal to mid stomach. Lower thoracic spine degenerative changes. IMPRESSION: Nasogastric tube tip and side hole in the proximal to mid stomach. Electronically Signed   By: Beckie Salts M.D.   On:  07/14/2023 17:09   DG Abdomen Acute W/Chest  Result Date: 07/14/2023 CLINICAL DATA:  Vomiting EXAM: DG ABDOMEN ACUTE WITH 1 VIEW CHEST COMPARISON:  07/10/2023. FINDINGS: There is no evidence of dilated bowel loops or free intraperitoneal air. No radiopaque calculi or other significant radiographic abnormality is seen. Heart size and mediastinal contours are within normal limits. Both lungs are clear. There are thoracic degenerative changes. Right-sided Port-A-Cath tip overlies distal SVC. IMPRESSION: Negative abdominal radiographs.  No acute cardiopulmonary disease. Electronically Signed   By: Layla Maw M.D.   On: 07/14/2023 14:40    Medications: Scheduled:  Chlorhexidine Gluconate Cloth  6 each Topical Daily   dorzolamide-timolol  1 drop Both Eyes BID   insulin aspart  0-9 Units Subcutaneous Q4H   latanoprost  1 drop Both Eyes QHS   Continuous:  dextrose 5 % and 0.9 % NaCl 125 mL/hr at 07/15/23 1206    Assessment/Plan: 1) SBO. 2) Recurrent uterine cancer.   She will have further evaluation with an enteroscopy tomorrow.  The CT scan was reviewed and the stricture appears to be in the jejunum.  It is not clear if the area can be reached with the enteroscopy.  Plan: 1) Enteroscopy with Dr. [name] tomorrow.  LOS: 1 day   Angad Nabers D 07/15/2023, 4:14 PM

## 2023-07-16 ENCOUNTER — Inpatient Hospital Stay (HOSPITAL_COMMUNITY): Payer: Medicare HMO

## 2023-07-16 ENCOUNTER — Encounter (HOSPITAL_COMMUNITY): Payer: Self-pay | Admitting: Internal Medicine

## 2023-07-16 ENCOUNTER — Encounter (HOSPITAL_COMMUNITY): Admission: EM | Disposition: E | Payer: Self-pay | Source: Home / Self Care | Attending: Internal Medicine

## 2023-07-16 DIAGNOSIS — R112 Nausea with vomiting, unspecified: Secondary | ICD-10-CM | POA: Diagnosis not present

## 2023-07-16 DIAGNOSIS — K311 Adult hypertrophic pyloric stenosis: Secondary | ICD-10-CM | POA: Diagnosis not present

## 2023-07-16 DIAGNOSIS — R933 Abnormal findings on diagnostic imaging of other parts of digestive tract: Secondary | ICD-10-CM | POA: Diagnosis not present

## 2023-07-16 DIAGNOSIS — D649 Anemia, unspecified: Secondary | ICD-10-CM

## 2023-07-16 DIAGNOSIS — K317 Polyp of stomach and duodenum: Secondary | ICD-10-CM | POA: Diagnosis not present

## 2023-07-16 DIAGNOSIS — I10 Essential (primary) hypertension: Secondary | ICD-10-CM

## 2023-07-16 DIAGNOSIS — Z87891 Personal history of nicotine dependence: Secondary | ICD-10-CM

## 2023-07-16 HISTORY — PX: ENTEROSCOPY: SHX5533

## 2023-07-16 HISTORY — PX: BIOPSY: SHX5522

## 2023-07-16 LAB — GLUCOSE, CAPILLARY
Glucose-Capillary: 142 mg/dL — ABNORMAL HIGH (ref 70–99)
Glucose-Capillary: 120 mg/dL — ABNORMAL HIGH (ref 70–99)
Glucose-Capillary: 118 mg/dL — ABNORMAL HIGH (ref 70–99)
Glucose-Capillary: 94 mg/dL (ref 70–99)
Glucose-Capillary: 151 mg/dL — ABNORMAL HIGH (ref 70–99)

## 2023-07-16 SURGERY — ENTEROSCOPY
Anesthesia: Monitor Anesthesia Care

## 2023-07-16 MED ORDER — PROPOFOL 10 MG/ML IV BOLUS
INTRAVENOUS | Status: AC
  Filled 2023-07-16: qty 20

## 2023-07-16 MED ORDER — PROPOFOL 500 MG/50ML IV EMUL
INTRAVENOUS | Status: AC
  Filled 2023-07-16: qty 50

## 2023-07-16 MED ORDER — POTASSIUM CHLORIDE 10 MEQ/50ML IV SOLN
10.0000 meq | INTRAVENOUS | Status: AC
  Administered 2023-07-16 (×4): 10 meq via INTRAVENOUS
  Filled 2023-07-16 (×6): qty 50

## 2023-07-16 MED ORDER — LACTATED RINGERS IV SOLN: INTRAVENOUS | Status: DC

## 2023-07-16 MED ORDER — POTASSIUM CHLORIDE 10 MEQ/50ML IV SOLN
10.0000 meq | INTRAVENOUS | Status: AC
  Administered 2023-07-16 (×2): 10 meq via INTRAVENOUS
  Filled 2023-07-16 (×2): qty 50

## 2023-07-16 MED ORDER — ONDANSETRON HCL 4 MG/2ML IJ SOLN
INTRAMUSCULAR | Status: DC | PRN
Start: 2023-07-16 — End: 2023-07-16
  Administered 2023-07-16: 4 mg via INTRAVENOUS

## 2023-07-16 MED ORDER — MAGNESIUM SULFATE 2 GM/50ML IV SOLN
2.0000 g | Freq: Once | INTRAVENOUS | Status: AC
  Administered 2023-07-16: 2 g via INTRAVENOUS
  Filled 2023-07-16: qty 50

## 2023-07-16 MED ORDER — ALUM & MAG HYDROXIDE-SIMETH 200-200-20 MG/5ML PO SUSP
30.0000 mL | ORAL | Status: DC | PRN
  Administered 2023-07-16 – 2023-07-19 (×6): 30 mL via ORAL
  Filled 2023-07-16 (×7): qty 30

## 2023-07-16 MED ORDER — PROPOFOL 10 MG/ML IV BOLUS
INTRAVENOUS | Status: DC | PRN
Start: 2023-07-16 — End: 2023-07-16
  Administered 2023-07-16: 50 mg via INTRAVENOUS
  Administered 2023-07-16 (×2): 30 mg via INTRAVENOUS
  Administered 2023-07-16: 100 mg via INTRAVENOUS
  Administered 2023-07-16: 30 mg via INTRAVENOUS

## 2023-07-16 MED ORDER — PROPOFOL 500 MG/50ML IV EMUL
INTRAVENOUS | Status: DC | PRN
  Administered 2023-07-16: 100 ug/kg/min via INTRAVENOUS

## 2023-07-16 MED ORDER — PANTOPRAZOLE SODIUM 40 MG IV SOLR
40.0000 mg | Freq: Every day | INTRAVENOUS | Status: DC
  Administered 2023-07-16 – 2023-07-17 (×2): 40 mg via INTRAVENOUS
  Filled 2023-07-16 (×2): qty 10

## 2023-07-16 MED ORDER — POTASSIUM CHLORIDE 10 MEQ/100ML IV SOLN: 10.0000 meq | INTRAVENOUS | Status: DC

## 2023-07-16 NOTE — Plan of Care (Signed)

## 2023-07-16 NOTE — Progress Notes (Signed)
PROGRESS NOTE  Sherri Gill  DOB: [birth year]  PCP: [name], MD XBM:[id number]  DOA: 08/06/23  LOS: 2 days  Hospital Day: 3  Brief narrative: Sherri Gill is a 72 y.o. female with PMH significant for advanced uterine cancer (s/p TAH, b/l SOO) with peritoneal carcinomatosis on active chemotherapy/immunotherapy, PAF on Xarelto, T2DM, HTN, HLD, migraine, anxiety, GERD 9/14, patient presented to the ED with complaint of persistent nausea, vomiting for several days, with poor oral intake, abdominal discomfort. CT A/P showed distention and wall thickening of stomach, proximal duodenum, some abnormal stranding along the remaining omentum and abdominal fluid collections along the small bowel mesentery and in the lesser sac.  This was raising concern for possible residual peritoneal tumor causing fluid deposits.  Patient was able to tolerate food and discharged to home.  Her symptoms continue to worsen at home and hence she returned back to ED on August 06, 2023.  In the ED, patient was afebrile, tachycardic, hemodynamically stable Labs with WC count 10.6, hemoglobin 10.6, BUN/creatinine 24/1.28, lipase 47 Abdominal x-ray was obtained which was negative for acute pathology EDP discussed with oncology Dr. [name] and GI Dr. [name] NG tube was placed after which patient started to feel better Admitted to [facility]  Subjective: Patient was seen and examined this morning.   Lying on bed.  NG tube remains attached to suction.  Pending EGD today.   Labs from this morning showed low electrolytes.  Replacement given.  Assessment and plan: Partial small bowel obstruction Presented with intractable nausea and vomiting in the setting of peritoneal carcinomatosis  Suspect possible malignant stricture of proximal small bowel CT abdomen 9/14 as above NG tube placed with significant improvement in symptoms.  Continue NG tube to suction. GI Dr. [name] following, noted to have EGD today. Continue IV fluid  hydration  Continue IV antiemetics and analgesics PRN  Advanced uterine cancer  (s/p TAH, b/l SOO) with peritoneal carcinomatosis on active chemotherapy/immunotherapy Follows with oncology, Dr. [name].    Hypokalemia/hypomagnesemia Low potassium and magnesium level today.  IV replacement given.  Continue to monitor. Recent Labs  Lab 07/10/23 1331 07/12/23 1249 08/06/23 1334 07/15/23 0320 07/12/2023 0515  K 3.8 2.8* 3.6 3.1* 2.9*  MG  --   --   --   --  1.5*  PHOS  --   --   --   --  3.0   Acute kidney injury: Mild due to the poor oral intake and GI losses.  Creatinine improving with hydration. Recent Labs    05/02/23 0844 05/30/23 0812 06/20/23 0844 07/04/23 0956 07/07/23 1049 07/10/23 1331 07/12/23 1249 08-06-23 1334 07/15/23 0320 07/24/2023 0515  BUN 24* 20 15 13 23 20 19  24* 17 10  CREATININE 1.33* 0.92 1.01* 0.81 0.90 0.87 0.82 1.28* 0.97 0.67   Paroxysmal atrial fibrillation Remains in sinus rhythm with controlled rate.   PTA meds- Cardizem 120 mg daily, Xarelto. Diltiazem on hold while NPO.  If unable to resume in next 24 hours, will start Cardizem drip Xarelto on hold in case she needs upper endoscopy with biopsy.   Hypertension: PTA meds- losartan 100 mg daily, Cardizem 120 mg daily Currently oral meds on hold IV hydralazine as needed  Type 2 diabetes mellitus A1c 6.4 on April 2024 PTA meds-metformin 1000 mg twice daily, Currently on D5 NS drip.  Blood sugar trend as below. Continue SSI/Accu-Cheks.   Recent Labs  Lab 07/15/23 1953 07/15/23 2350 07/13/2023 0411 07/20/2023 0734 07/09/2023 1115  GLUCAP 110* 139* 142* 120*  118*   Anemia chronic disease Vitamin B12 deficiency Hemoglobin at baseline close to 9.  No active bleeding.  Continue to monitor. Resume vitamin B12 supplement when able to. Recent Labs    03/21/23 0912 03/25/23 1541 07/10/23 1331 07/12/23 1249 08-12-23 1334 07/15/23 0320 07/22/2023 0515  HGB  --    < > 10.1* 9.7* 10.6* 8.8*  8.7*  MCV  --    < > 86.5 89.7 87.4 89.4 88.9  VITAMINB12 127*  --   --   --   --   --   --   FERRITIN 232  --   --   --   --   --   --   TIBC 260  --   --   --   --   --   --   IRON 42  --   --   --   --   --   --    < > = values in this interval not displayed.     Mobility: Independent at baseline.  Continue to encourage ambulation  Goals of care   Code Status: Full Code     DVT prophylaxis:  SCDs Start: 08-12-23 2156   Antimicrobials: None Fluid: D5 NS at 125 mL/h to continue Consultants: Oncology, GI Family Communication: None at bedside  Status: Inpatient Level of care:  Med-Surg   Patient is from: Home Needs to continue in-hospital care: Bowel obstruction Anticipated d/c to: Pending clinical course      Diet:  Diet Order             Diet NPO time specified  Diet effective now                   Scheduled Meds:  [MAR Hold] Chlorhexidine Gluconate Cloth  6 each Topical Daily   [MAR Hold] dorzolamide-timolol  1 drop Both Eyes BID   [MAR Hold] insulin aspart  0-9 Units Subcutaneous Q4H   [MAR Hold] latanoprost  1 drop Both Eyes QHS   [MAR Hold] pantoprazole (PROTONIX) IV  40 mg Intravenous Daily    PRN meds: [MAR Hold] acetaminophen, [MAR Hold] hydrALAZINE, [MAR Hold]  HYDROmorphone (DILAUDID) injection, [MAR Hold] metoprolol tartrate, [MAR Hold] ondansetron (ZOFRAN) IV   Infusions:   dextrose 5 % and 0.9 % NaCl 125 mL/hr at 07/06/2023 1234   lactated ringers 10 mL/hr at 07/07/2023 1344   [MAR Hold] potassium chloride 10 mEq (06/29/2023 1225)    Antimicrobials: Anti-infectives (From admission, onward)    None       Objective: Vitals:   07/25/2023 0410 07/05/2023 1300  BP: (!) 146/64 (!) 146/61  Pulse: 82 75  Resp: 14 17  Temp: 98.1 F (36.7 C) 98.8 F (37.1 C)  SpO2: 93% 98%    Intake/Output Summary (Last 24 hours) at 07/24/2023 1427 Last data filed at 07/26/2023 1115 Gross per 24 hour  Intake 2192.13 ml  Output 1700 ml  Net 492.13 ml    Filed Weights   07/22/2023 0410 07/02/2023 1300  Weight: 80.9 kg 80.9 kg   Weight change:  Body mass index is 31.59 kg/m.   Physical Exam: General exam: Pleasant elderly African-American female. Skin: No rashes, lesions or ulcers. HEENT: Atraumatic, normocephalic, no obvious bleeding Lungs: Clear to auscultation bilaterally CVS: Regular rate and rhythm, no murmur GI/Abd soft, distention improved, mild tenderness generalized, bowel sound present but sluggish CNS: Alert, awake, oriented x 3 Psychiatry: Mood appropriate Extremities: No edema, no calf tenderness  Data Review:  I have personally reviewed the laboratory data and studies available.  F/u labs ordered Unresulted Labs (From admission, onward)     Start     Ordered   07/23/2023 0500  CBC with Differential/Platelet  Daily,   R     Question:  Specimen collection method  Answer:  Unit=Unit collect   07/15/23 1019   07/11/2023 0500  Basic metabolic panel  Daily,   R     Question:  Specimen collection method  Answer:  Unit=Unit collect   07/15/23 1019            Total time spent in review of labs and imaging, patient evaluation, formulation of plan, documentation and communication with family: 45 minutes  Signed, Lorin Glass, MD [facility] 07/05/2023

## 2023-07-16 NOTE — Transfer of Care (Signed)
Immediate Anesthesia Transfer of Care Note  Patient: Sherri Gill  Procedure(s) Performed: ENTEROSCOPY BIOPSY  Patient Location: PACU and Endoscopy Unit  Anesthesia Type:MAC  Level of Consciousness: drowsy  Airway & Oxygen Therapy: Patient Spontanous Breathing and Patient connected to face mask oxygen  Post-op Assessment: Report given to RN and Post -op Vital signs reviewed and stable  Post vital signs: Reviewed and stable  Last Vitals:  Vitals Value Taken Time  BP    Temp    Pulse    Resp    SpO2      Last Pain:  Vitals:   07/16/23 1300  TempSrc: Temporal  PainSc: 0-No pain      Patients Stated Pain Goal: 0 (07/15/23 0302)  Complications: No notable events documented.

## 2023-07-16 NOTE — Op Note (Addendum)
[facility] Patient Name: Sherri Gill Procedure Date: 07/03/2023 MRN: [id number] Attending MD: Charna Elizabeth , MD, [id number] Date of Birth: 11-15-1950 CSN: [id number] Age: 72 Admit Type: Inpatient Procedure:                Small bowel enteroscopy with biopsies Indications:              Nausea and vomiting, Abnormal abdominal CT, Anemia                            of chronic disease; Metastatic uterine cancer. Providers:                Charna Elizabeth, MD, Doristine Mango, RN, Kandice Robinsons, Technician, Myvette Ivar Bury Referring MD:             Artis Delay, MD, [name], MD Medicines:                Monitored Anesthesia Care Complications:            No immediate complications. Estimated Blood Loss:     Estimated blood loss was minimal. Procedure:                Pre-Anesthesia Assessment: - Prior to the                            procedure, a history and physical was performed,                            and patient medications and allergies were                            reviewed. The patient's tolerance of previous                            anesthesia was also reviewed. The risks and                            benefits of the procedure and the sedation options                            and risks were discussed with the patient. All                            questions were answered, and informed consent was                            obtained. Prior Anticoagulants: The patient has                            taken Xarelto (rivaroxaban), last dose was 3 days                            prior to procedure. ASA Grade Assessment: IV - A  patient with severe systemic disease that is a                            constant threat to life. After reviewing the risks                            and benefits, the patient was deemed in                            satisfactory condition to undergo the procedure.                             After obtaining informed consent, the endoscope was                            passed under direct vision. Throughout the                            procedure, the patient's blood pressure, pulse, and                            oxygen saturations were monitored continuously. The                            [id number]-HQ190L ([id number]) Olympus colonoscope was                            introduced through the mouth and advanced to the                            proximal jejunum. The small bowel enteroscopy was                            performed with moderate difficulty due to abnormal                            anatomy. The patient tolerated the procedure well. Scope In: Scope Out: Findings:      The examined esophagus and GEJ appeared widely patent and normal; SCJ       was measured at 40 cm.      Diffuse severely edematous gastric folds were found in the cardia, in       the gastric fundus and in the gastric body-biopsies were taken with a       cold forceps for histology.      A small sessile polyp was found in the first portion of the       duodenum-this was removed by cold biopsies; random duodenal biospies       were also done.      There was no evidence of significant pathology in the proximal       jejunum-biospies done. Impression:               - Normal appearing, widely patent esophagus and GEJ.                           -  Edematous gastric folds-biopsied.                           - No other abnormalities were noted on retroflexion.                           - A single duodenal polyp in the post-bulbar                            duodenum-biopsied                           - The rest of the examined duodneal mucosa appeared                            normal-biopsies done.                           - The examined portion of the jejunum was                            normal-biopsies done. Moderate Sedation:      MAC used. Recommendation:           - Clear liquid  diet today.                           - Await pathology results. Procedure Code(s):        --- Professional ---                           [phone], Small intestinal endoscopy, enteroscopy                            beyond second portion of duodenum, not including                            ileum; with biopsy, single or multiple Diagnosis Code(s):        --- Professional ---                           R11.2, Nausea with vomiting, unspecified                           R93.3, Abnormal findings on diagnostic imaging of                            other parts of digestive tract                           D64.9, Anemia, unspecified                           K31.7, Polyp of stomach and duodenum                           K31.89, Other diseases of stomach and duodenum CPT copyright 2022 American [employer] Association.  All rights reserved. The codes documented in this report are preliminary and upon coder review may  be revised to meet current compliance requirements. Charna Elizabeth, MD Charna Elizabeth, MD 2023-07-18 3:05:03 PM This report has been signed electronically. Number of Addenda: 0

## 2023-07-16 NOTE — Anesthesia Postprocedure Evaluation (Signed)
Anesthesia Post Note  Patient: AH TROST  Procedure(s) Performed: ENTEROSCOPY BIOPSY     Patient location during evaluation: PACU Anesthesia Type: MAC Level of consciousness: awake and alert Pain management: pain level controlled Vital Signs Assessment: post-procedure vital signs reviewed and stable Respiratory status: spontaneous breathing, nonlabored ventilation, respiratory function stable and patient connected to nasal cannula oxygen Cardiovascular status: stable and blood pressure returned to baseline Postop Assessment: no apparent nausea or vomiting Anesthetic complications: no  No notable events documented.  Last Vitals:  Vitals:   07/16/23 1500 07/16/23 1510  BP: 133/65 138/61  Pulse: 74 69  Resp: (!) 29 (!) 22  Temp:    SpO2: 95% 95%    Last Pain:  Vitals:   07/16/23 1510  TempSrc:   PainSc: 0-No pain                 Kennieth Rad

## 2023-07-16 NOTE — TOC Initial Note (Signed)
Transition of Care [facility]) - Initial/Assessment Note    Patient Details  Name: Sherri Gill MRN: [id number] Date of Birth: Aug 01, 1951  [facility] CM/SW Contact:    Beckie Busing, RN Phone [phone]  Aug 13, 2023, 11:36 AM  Clinical Narrative:                 TOC following patient with high risk for readmission. CM at bedside for assessment. Patient states that she is from home where she functions independently. Patient reports that she does have PCP- [name]) and follows up on  a regular basis. Patients pharmacy of choice [facility]  Patient does have transportation to appointments. Patient currently has no DME or Home health services. There are currently no TOC needsPlease consult TOC if any needs should arise.  Expected Discharge Plan: Home/Self Care Barriers to Discharge: Continued Medical Work up   Patient Goals and CMS Choice Patient states their goals for this hospitalization and ongoing recovery are:: Wants to feel better to go home CMS Medicare.gov Compare Post Acute Care list provided to::  (n/a) Choice offered to / list presented to : NA [place] ownership interest in [facility].provided to::  (n/a)    Expected Discharge Plan and Services In-house Referral: NA Discharge Planning Services: CM Consult Post Acute Care Choice: NA Living arrangements for the past 2 months: Apartment                 DME Arranged: N/A                    Prior Living Arrangements/Services Living arrangements for the past 2 months: Apartment Lives with:: Adult Children Patient language and need for interpreter reviewed:: Yes Do you feel safe going back to the place where you live?: Yes      Need for Family Participation in Patient Care: No (Comment) Care giver support system in place?: Yes (comment) Current home services:  (n/a) Criminal Activity/Legal Involvement Pertinent to Current Situation/Hospitalization:  No - Comment as needed  Activities of Daily Living Home Assistive Devices/Equipment: None ADL Screening (condition at time of admission) Patient's cognitive ability adequate to safely complete daily activities?: No Is the patient deaf or have difficulty hearing?: No Does the patient have difficulty seeing, even when wearing glasses/contacts?: No Does the patient have difficulty concentrating, remembering, or making decisions?: No Patient able to express need for assistance with ADLs?: No Does the patient have difficulty dressing or bathing?: No Independently performs ADLs?: Yes (appropriate for developmental age) Does the patient have difficulty walking or climbing stairs?: No Weakness of Legs: None Weakness of Arms/Hands: None  Permission Sought/Granted Permission sought to share information with : Family Supports Permission granted to share information with : No              Emotional Assessment     Affect (typically observed): Calm, Pleasant Orientation: : Oriented to Self, Oriented to Place, Oriented to  Time, Oriented to Situation Alcohol / Substance Use: Not Applicable Psych Involvement: No (comment)  Admission diagnosis:  Dehydration [E86.0] Gastric outlet obstruction [K31.1] Nausea and vomiting, unspecified vomiting type [R11.2] Patient Active Problem List   Diagnosis Date Noted   Gastric outlet obstruction 07/09/2023   Cancer associated pain 06/20/2023   Vitamin B12 deficiency 06/20/2023   Other constipation 05/30/2023   Bilateral leg pain 05/02/2023   Bowel habit changes 04/11/2023   Elevated serum creatinine 02/28/2023   Bloating symptom 02/02/2023   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) 08/23/2022   Anemia due to antineoplastic chemotherapy 05/28/2022   PAF (paroxysmal atrial fibrillation) (HCC) onset post-op Spring 2023 04/12/2022   Hypomagnesemia 03/08/2022   Chronic nausea 01/15/2022   Uterine cancer (HCC) 01/14/2022   Umbilical hernia 09/28/2021    Radicular pain in left arm 11/10/2020   Routine health maintenance 11/10/2020   Personal history of colonic polyps 11/10/2020   Elevated alkaline phosphatase level 03/17/2016   Iron deficiency anemia 08/29/2012   Obesity (BMI 30.0-34.9) 06/11/2012   Dyslipidemia associated with type 2 diabetes mellitus (HCC) 06/30/2007   Major depressive disorder with single episode, in remission (HCC) 06/30/2007   Essential hypertension 06/30/2007   GERD 06/30/2007   Type 2 diabetes mellitus with neurological complications (HCC) 06/30/2007   PCP:  [name], MD Pharmacy:   [facility] [address] [place] [address] Phone: [phone] Fax: [phone]     Social Determinants of Health (SDOH) Social History: SDOH Screenings   Food Insecurity: No Food Insecurity (07/21/2023)  Housing: Low Risk  (07/08/2023)  Transportation Needs: No Transportation Needs (07/23/2023)  Utilities: Not At Risk (07/21/2023)  Alcohol Screen: Low Risk  (02/20/2023)  Depression (PHQ2-9): Medium Risk (03/25/2023)  Financial Resource Strain: Medium Risk (02/20/2023)  Physical Activity: Inactive (02/20/2023)  Social Connections: Unknown (02/20/2023)  Stress: Stress Concern Present (02/20/2023)  Tobacco Use: Medium Risk (07/13/2023)   SDOH Interventions:     Readmission Risk Interventions    07/06/2023   11:28 AM  Readmission Risk Prevention Plan  Transportation Screening Complete  PCP or Specialist Appt within 3-5 Days Complete  HRI or Home Care Consult Complete  Social Work Consult for Recovery Care Planning/Counseling Complete  Palliative Care Screening Not Applicable  Medication Review Oceanographer) Referral to Pharmacy

## 2023-07-16 NOTE — Progress Notes (Signed)
Sherri Gill   DOB:1951/02/28   ZO#:[id number]    ASSESSMENT & PLAN:   Recurrent uterine cancer She presents with partial small bowel obstruction Appreciate GI consult Already feeling better with NG tube decompression and IV fluids Need assessment by GI for evaluation for possible malignant stricture with EGD; scheduled for today If malignant stricture is seen/biopsied, she would need inpatient chemotherapy   Nausea, vomiting and dehydration She is feeling better with nothing by mouth, IV fluids and antiemetics as needed   Intermittent thigh pain Cause unknown Recommend heat pad, this is improving   Anemia chronic disease She is not symptomatic Observe only   Dehydration, mild hypokalemia and other electrolyte imbalance Likely due to poor oral intake Replace as needed   Discharge planning Unknown.  I suspect she has malignant stricture If confirmed on EGD/biopsy, she would need inpatient chemotherapy.  She would need to be started on TPN I will follow    All questions were answered. The patient knows to call the clinic with any problems, questions or concerns.   The total time spent in the appointment was 40 minutes encounter with patients including review of chart and various tests results, discussions about plan of care and coordination of care plan  Artis Delay, MD 07/26/2023 7:51 AM  Subjective:  She is feeling okay this morning.  Her thigh pain has improved.  Nausea has improved.  She is anxious.  She wants to know the plan.  She is scheduled for EGD today  Objective:  Vitals:   07/15/23 1952 06/30/2023 0410  BP: 137/60 (!) 146/64  Pulse: 73 82  Resp: 14 14  Temp: 98.2 F (36.8 C) 98.1 F (36.7 C)  SpO2: 91% 93%     Intake/Output Summary (Last 24 hours) at 07/04/2023 0751 Last data filed at 07/02/2023 0414 Gross per 24 hour  Intake 2072.13 ml  Output 100 ml  Net 1972.13 ml    GENERAL:alert, no distress and comfortable  NEURO: alert & oriented x 3 with  fluent speech, no focal motor/sensory deficits   Labs:  Recent Labs    07/10/23 1331 07/10/23 1331 07/12/23 1249 07/01/2023 1334 07/15/23 0320 06/29/2023 0515  NA 134*  --  134* 135 137 135  K 3.8  --  2.8* 3.6 3.1* 2.9*  CL 96*  --  96* 97* 102 102  CO2 28  --  27 28 27 25   GLUCOSE 105*  --  110* 104* 93 139*  BUN 20  --  19 24* 17 10  CREATININE 0.87  --  0.82 1.28* 0.97 0.67  CALCIUM 10.1  --  9.6 9.8 8.9 8.5*  GFRNONAA >60   < > >60 45* >60 >60  PROT 7.6  --  7.1 7.3  --   --   ALBUMIN 4.3  --  3.9 4.2  --   --   AST 12*  --  12* 10*  --   --   ALT 12  --  13 8  --   --   ALKPHOS 128*  --  108 117  --   --   BILITOT 0.3  --  0.9 0.3  --   --    < > = values in this interval not displayed.    Studies:  DG Abd Portable 1 View  Result Date: 07/12/2023 CLINICAL DATA:  Nasogastric tube placement. EXAM: PORTABLE ABDOMEN - 1 VIEW COMPARISON:  Abdomen and pelvis CT dated 07/12/2023. FINDINGS: Normal bowel-gas pattern. Interval nasogastric tube with  its tip and side hole in the proximal to mid stomach. Lower thoracic spine degenerative changes. IMPRESSION: Nasogastric tube tip and side hole in the proximal to mid stomach. Electronically Signed   By: Beckie Salts M.D.   On: 07/01/2023 17:09   DG Abdomen Acute W/Chest  Result Date: 07/02/2023 CLINICAL DATA:  Vomiting EXAM: DG ABDOMEN ACUTE WITH 1 VIEW CHEST COMPARISON:  07/10/2023. FINDINGS: There is no evidence of dilated bowel loops or free intraperitoneal air. No radiopaque calculi or other significant radiographic abnormality is seen. Heart size and mediastinal contours are within normal limits. Both lungs are clear. There are thoracic degenerative changes. Right-sided Port-A-Cath tip overlies distal SVC. IMPRESSION: Negative abdominal radiographs.  No acute cardiopulmonary disease. Electronically Signed   By: Layla Maw M.D.   On: 07/28/2023 14:40   CT ABDOMEN PELVIS W CONTRAST  Result Date: 07/12/2023 CLINICAL DATA:   Endometrial/uterine cancer.  Nausea and vomiting. * Tracking Code: BO * EXAM: CT ABDOMEN AND PELVIS WITH CONTRAST TECHNIQUE: Multidetector CT imaging of the abdomen and pelvis was performed using the standard protocol following bolus administration of intravenous contrast. RADIATION DOSE REDUCTION: This exam was performed according to the departmental dose-optimization program which includes automated exposure control, adjustment of the mA and/or kV according to patient size and/or use of iterative reconstruction technique. CONTRAST:  OMNIPAQUE IOHEXOL 300 MG/ML  SOLN COMPARISON:  CT abdomen/pelvis from 06/17/2023 FINDINGS: Lower chest: A catheter terminates in the right atrium on the top most image. Mild cardiomegaly. Hepatobiliary: Unremarkable Pancreas: Unremarkable Spleen: Unremarkable Adrenals/Urinary Tract: Unremarkable Stomach/Bowel: Diffuse gastric wall thickening with mild gastric and substantial proximal duodenal distension. After crossing the midline, the duodenum does not appear distended but there is substantial distal duodenal and jejunal wall thickening, worsened from prior exams. Scattered colonic diverticula. Appendix unremarkable. No pneumatosis. No hypoenhancement of bowel wall. There are abnormal contains small fluid collections along the small bowel mesentery, for example along the left abdomen on image 64 series 7 and along the right abdomen on image 55 series 7. In addition there is fluid density in the lesser sac on image 76 series 7. Vascular/Lymphatic: Mild atheromatous vascular disease of the aortoiliac tree. Patent celiac trunk and SMA. Patent superior mesenteric vein. Suspected left iliac adenopathy measuring about 1.3 cm in diameter on image 47 series 2, stable. This is of similar density to the adjacent vein. Reproductive: Uterus absent. Nodular enhancement along the left vaginal cuff measuring 2.5 by 1.7 cm, previously 2.3 by 1.7 cm by my measurements. Tumor in this location is  not excluded. Other: There is some abnormal stranding along the remaining omentum for example on image 73 series 9. Small nodular deposit along the upper omentum on image 31 series 2. Musculoskeletal: Lower thoracic and lumbar spondylosis. Density within the umbilicus on image 102 series 9, potentially a small knuckle of bowel versus tumor. IMPRESSION: 1. Worsening of gastric wall thickening, distal duodenal wall thickening, and jejunal wall thickening. The stomach and proximal duodenum are distended. The appearance favors gastroenteritis. 2. There is also some abnormal stranding along the remaining omentum. There are also abnormal fluid collections along the small bowel mesentery and in the lesser sac. The appearance raises concern for possible residual peritoneal tumor causing fluid deposits. Small nodular deposit along the upper omentum along with nodularity in the umbilicus. 3. Nodular enhancement along the left vaginal cuff measuring 2.5 by 1.7 cm, previously 2.3 by 1.7 cm by my measurements. This likely represents tumor. 4. Suspected left iliac adenopathy measuring about  1.3 cm in diameter, stable. 5. Mild cardiomegaly. 6. Scattered colonic diverticula. 7. Mild atheromatous vascular disease of the aortoiliac tree. 8. Lower thoracic and lumbar spondylosis. Electronically Signed   By: Gaylyn Rong M.D.   On: 07/12/2023 14:56   DG Abd 1 View  Result Date: 07/10/2023 CLINICAL DATA:  Abdominal pain, vomiting. History of uterine cancer. EXAM: ABDOMEN - 1 VIEW COMPARISON:  April 09, 2022. FINDINGS: The bowel gas pattern is normal. Surgical clip is seen over the left sacrum. No radio-opaque calculi or other significant radiographic abnormality are seen. IMPRESSION: No abnormal bowel dilatation. Electronically Signed   By: Lupita Raider M.D.   On: 07/10/2023 14:18   CT CHEST ABDOMEN PELVIS W CONTRAST  Result Date: 06/17/2023 CLINICAL DATA:  History of endometrial cancer/uterine cancer. Evaluate treatment  response to chemotherapy. * Tracking Code: BO *. EXAM: CT CHEST, ABDOMEN, AND PELVIS WITH CONTRAST TECHNIQUE: Multidetector CT imaging of the chest, abdomen and pelvis was performed following the standard protocol during bolus administration of intravenous contrast. RADIATION DOSE REDUCTION: This exam was performed according to the departmental dose-optimization program which includes automated exposure control, adjustment of the mA and/or kV according to patient size and/or use of iterative reconstruction technique. CONTRAST:  OMNIPAQUE IOHEXOL 300 MG/ML  SOLN COMPARISON:  Multiple priors including most recent CT April 08, 2023 FINDINGS: CT CHEST FINDINGS Cardiovascular: Right chest Port-A-Cath with tip in the right atrium. Aortic atherosclerosis. No central pulmonary embolus on this nondedicated study. Normal size heart. No significant pericardial effusion/thickening. Mediastinum/Nodes: No suspicious thyroid nodule. No pathologically enlarged mediastinal, hilar or axillary lymph nodes. Right paratracheal lymph node now measures 5 mm in short axis on image 17/2 previously 7 mm prevascular lymph node now measures 3 mm in short axis on image 20/2 previously 5 mm the esophagus is grossly unremarkable. Lungs/Pleura: No suspicious pulmonary nodules or masses. No pleural effusion. No pneumothorax. Musculoskeletal: Similar appearance of the sclerotic lesions in the T2 and T8 vertebral bodies. No new aggressive lytic or blastic lesion of bone. CT ABDOMEN PELVIS FINDINGS Hepatobiliary: No suspicious hepatic lesion. Gallbladder is unremarkable. No biliary ductal dilation. Pancreas: No pancreatic ductal dilation or evidence of acute inflammation Spleen: No splenomegaly. Adrenals/Urinary Tract: Bilateral adrenal glands appear normal. No hydronephrosis. Kidneys demonstrate symmetric enhancement. Urinary bladder is minimally distended limiting evaluation Stomach/Bowel: No radiopaque enteric contrast material was  administered. Similar asymmetric thickening of the anterior gastric antrum measuring 15 mm on image 64/2, unchanged. No pathologic dilation of small or large bowel. Colonic diverticulosis without findings of acute diverticulitis. Vascular/Lymphatic: Aortic atherosclerosis. Normal caliber abdominal aorta. Smooth IVC contours. The portal, splenic and superior mesenteric veins are patent. Left external iliac lymph node measures 9 mm in short axis on image 102/2 previously 11 mm. -left obturator lymph node measures 2 mm in short axis on image 99/2 previously 4 mm. Reproductive: Uterus is surgically absent. Nodular soft tissue along the left side of the vaginal cuff now measures 2.1 x 1.8 cm on image 98/2 previously 3.3 x 2.4 cm. Other: Peritoneal/omental thickening and nodularity is decreased from prior. For instance in the central abdomen subjacent to the umbilicus nodular focus measures 10 mm on image 79/2 previously 2.2 cm. No significant abdominopelvic free fluid. Musculoskeletal: No new suspicious lytic or blastic lesion of bone. Multilevel degenerative changes spine. IMPRESSION: 1. Findings consistent with treatment response including decreased size of the nodular soft tissue along the left side of the vaginal cuff, decreased size of the peritoneal/omental thickening and nodularity, and  decreased size of the mediastinal, left external iliac and left obturator lymph nodes. 2. Similar appearance of the sclerotic lesions in the T2 and T8 vertebral bodies. No new suspicious osseous lesions. 3. Similar asymmetric thickening of the anterior gastric antrum measuring 15 mm, unchanged. 4.  Aortic Atherosclerosis (ICD10-I70.0). Electronically Signed   By: Maudry Mayhew M.D.   On: 06/17/2023 14:46

## 2023-07-16 NOTE — Interval H&P Note (Signed)
History and Physical Interval Note:  07/16/2023 2:10 PM  Sherri Gill  has presented today for surgery, with the diagnosis of SBO.  The various methods of treatment have been discussed with the patient and family. After consideration of risks, benefits and other options for treatment, the patient has consented to  Procedure(s): ENTEROSCOPY (N/A) as a surgical intervention.  The patient's history has been reviewed, patient examined, no change in status, stable for surgery.  I have reviewed the patient's chart and labs.  Questions were answered to the patient's satisfaction.     Charna Elizabeth

## 2023-07-16 NOTE — Anesthesia Procedure Notes (Signed)
Procedure Name: MAC Date/Time: 07/16/2023 2:19 PM  Performed by: Maurene Capes, CRNAPre-anesthesia Checklist: Patient identified, Emergency Drugs available, Patient being monitored and Suction available Patient Re-evaluated:Patient Re-evaluated prior to induction Oxygen Delivery Method: Simple face mask Preoxygenation: Pre-oxygenation with 100% oxygen Induction Type: IV induction Placement Confirmation: positive ETCO2 Dental Injury: Teeth and Oropharynx as per pre-operative assessment

## 2023-07-16 NOTE — Anesthesia Preprocedure Evaluation (Signed)
Anesthesia Evaluation  Patient identified by MRN, date of birth, ID band Patient awake    Reviewed: Allergy & Precautions, NPO status , Patient's Chart, lab work & pertinent test results  Airway Mallampati: III   Neck ROM: Full  Mouth opening: Limited Mouth Opening  Dental  (+) Dental Advisory Given   Pulmonary former smoker   breath sounds clear to auscultation       Cardiovascular hypertension, Pt. on medications  Rhythm:Regular Rate:Normal     Neuro/Psych  Neuromuscular disease    GI/Hepatic Neg liver ROS,GERD  ,,SBO   Endo/Other  diabetes, Type 2Hypothyroidism    Renal/GU Renal disease     Musculoskeletal   Abdominal   Peds  Hematology  (+) Blood dyscrasia, anemia   Anesthesia Other Findings   Reproductive/Obstetrics                             Anesthesia Physical Anesthesia Plan  ASA: 3  Anesthesia Plan: MAC   Post-op Pain Management: Minimal or no pain anticipated   Induction:   PONV Risk Score and Plan: 2 and Propofol infusion, Ondansetron and Treatment may vary due to age or medical condition  Airway Management Planned: Natural Airway and Nasal Cannula  Additional Equipment:   Intra-op Plan:   Post-operative Plan:   Informed Consent: I have reviewed the patients History and Physical, chart, labs and discussed the procedure including the risks, benefits and alternatives for the proposed anesthesia with the patient or authorized representative who has indicated his/her understanding and acceptance.       Plan Discussed with: CRNA  Anesthesia Plan Comments:        Anesthesia Quick Evaluation

## 2023-07-17 DIAGNOSIS — C549 Malignant neoplasm of corpus uteri, unspecified: Secondary | ICD-10-CM | POA: Diagnosis not present

## 2023-07-17 DIAGNOSIS — R112 Nausea with vomiting, unspecified: Secondary | ICD-10-CM | POA: Diagnosis not present

## 2023-07-17 DIAGNOSIS — K311 Adult hypertrophic pyloric stenosis: Secondary | ICD-10-CM | POA: Diagnosis not present

## 2023-07-17 DIAGNOSIS — R933 Abnormal findings on diagnostic imaging of other parts of digestive tract: Secondary | ICD-10-CM | POA: Diagnosis not present

## 2023-07-17 LAB — CBC WITH DIFFERENTIAL/PLATELET
Abs Immature Granulocytes: 0.03 10*3/uL (ref 0.00–0.07)
Basophils Absolute: 0 10*3/uL (ref 0.0–0.1)
Basophils Relative: 0 %
Eosinophils Absolute: 0.1 10*3/uL (ref 0.0–0.5)
Eosinophils Relative: 1 %
HCT: 27.6 % — ABNORMAL LOW (ref 36.0–46.0)
Hemoglobin: 8.4 g/dL — ABNORMAL LOW (ref 12.0–15.0)
Immature Granulocytes: 0 %
Lymphocytes Relative: 20 %
Lymphs Abs: 1.5 10*3/uL (ref 0.7–4.0)
MCH: 27.4 pg (ref 26.0–34.0)
MCHC: 30.4 g/dL (ref 30.0–36.0)
MCV: 89.9 fL (ref 80.0–100.0)
Monocytes Absolute: 0.9 10*3/uL (ref 0.1–1.0)
Monocytes Relative: 12 %
Neutro Abs: 4.9 10*3/uL (ref 1.7–7.7)
Neutrophils Relative %: 67 %
Platelets: 257 10*3/uL (ref 150–400)
RBC: 3.07 MIL/uL — ABNORMAL LOW (ref 3.87–5.11)
RDW: 16.1 % — ABNORMAL HIGH (ref 11.5–15.5)
WBC: 7.3 10*3/uL (ref 4.0–10.5)
nRBC: 0 % (ref 0.0–0.2)

## 2023-07-17 LAB — GLUCOSE, CAPILLARY
Glucose-Capillary: 100 mg/dL — ABNORMAL HIGH (ref 70–99)
Glucose-Capillary: 101 mg/dL — ABNORMAL HIGH (ref 70–99)
Glucose-Capillary: 125 mg/dL — ABNORMAL HIGH (ref 70–99)
Glucose-Capillary: 125 mg/dL — ABNORMAL HIGH (ref 70–99)
Glucose-Capillary: 165 mg/dL — ABNORMAL HIGH (ref 70–99)
Glucose-Capillary: 98 mg/dL (ref 70–99)

## 2023-07-17 LAB — BASIC METABOLIC PANEL
Anion gap: 7 (ref 5–15)
BUN: 6 mg/dL — ABNORMAL LOW (ref 8–23)
CO2: 25 mmol/L (ref 22–32)
Calcium: 8.5 mg/dL — ABNORMAL LOW (ref 8.9–10.3)
Chloride: 106 mmol/L (ref 98–111)
Creatinine, Ser: 0.68 mg/dL (ref 0.44–1.00)
GFR, Estimated: >60 mL/min (ref >60)
Glucose, Bld: 120 mg/dL — ABNORMAL HIGH (ref 70–99)
Potassium: 3.2 mmol/L — ABNORMAL LOW (ref 3.5–5.1)
Sodium: 138 mmol/L (ref 135–145)

## 2023-07-17 LAB — SURGICAL PATHOLOGY

## 2023-07-17 MED ORDER — BISACODYL 10 MG RE SUPP
10.0000 mg | Freq: Once | RECTAL | Status: DC
  Filled 2023-07-17: qty 1

## 2023-07-17 MED ORDER — BOOST / RESOURCE BREEZE PO LIQD CUSTOM
1.0000 | Freq: Two times a day (BID) | ORAL | Status: DC
  Administered 2023-07-17 – 2023-07-21 (×7): 1 via ORAL

## 2023-07-17 MED ORDER — PANTOPRAZOLE SODIUM 40 MG IV SOLR
40.0000 mg | Freq: Two times a day (BID) | INTRAVENOUS | Status: DC
  Administered 2023-07-17 – 2023-08-03 (×35): 40 mg via INTRAVENOUS
  Filled 2023-07-17 (×33): qty 10

## 2023-07-17 MED ORDER — POTASSIUM CHLORIDE 10 MEQ/50ML IV SOLN
10.0000 meq | INTRAVENOUS | Status: AC
  Administered 2023-07-17 (×2): 10 meq via INTRAVENOUS
  Filled 2023-07-17 (×2): qty 50

## 2023-07-17 NOTE — Progress Notes (Signed)
Sherri Gill   DOB:1951/05/23   UE#:[id number]    ASSESSMENT & PLAN:   Recurrent uterine cancer She presents with partial small bowel obstruction Appreciate GI consult Already feeling better with NG tube decompression and IV fluids I reviewed the operative result from her EGD yesterday Biopsy is taken but no signs of malignant obstruction Continue conservative approach, pending biopsy report   Nausea, vomiting and dehydration She is feeling better with nothing by mouth, IV fluids and antiemetics as needed NG tube was removed She will continue antiemetics as needed and slowly advance diet with clear liquid diet   Intermittent thigh pain Cause unknown Recommend heat pad, this is improving   Anemia chronic disease She is not symptomatic Observe only   Dehydration, mild hypokalemia and other electrolyte imbalance Likely due to poor oral intake Replace as needed   Discharge planning Unknown.  I am hopeful we can get biopsy report back by end of tomorrow Hopefully she can be discharged if she can tolerate dietary modification  All questions were answered. The patient knows to call the clinic with any problems, questions or concerns.   The total time spent in the appointment was 40 minutes encounter with patients including review of chart and various tests results, discussions about plan of care and coordination of care plan  Artis Delay, MD 07/17/2023 8:02 AM  Subjective:  She is seen this morning.  NG tube has been removed.  She tolerated EGD well.  Will review test results and plan of care.  She denies abdominal pain no nausea this morning  Objective:  Vitals:   07/23/2023 2036 07/17/23 0446  BP: 125/62 138/61  Pulse: 73 72  Resp: 18 18  Temp: 98.1 F (36.7 C) 98.5 F (36.9 C)  SpO2: 94% 95%     Intake/Output Summary (Last 24 hours) at 07/17/2023 0802 Last data filed at 07/20/2023 1445 Gross per 24 hour  Intake 570 ml  Output 1700 ml  Net -1130 ml     GENERAL:alert, no distress and comfortable    Labs:  Recent Labs    07/10/23 1331 07/10/23 1331 07/12/23 1249 07/11/2023 1334 07/15/23 0320 07/08/2023 0515 07/17/23 0632  NA 134*  --  134* 135 137 135 138  K 3.8  --  2.8* 3.6 3.1* 2.9* 3.2*  CL 96*  --  96* 97* 102 102 106  CO2 28  --  27 28 27 25 25   GLUCOSE 105*  --  110* 104* 93 139* 120*  BUN 20  --  19 24* 17 10 6*  CREATININE 0.87  --  0.82 1.28* 0.97 0.67 0.68  CALCIUM 10.1  --  9.6 9.8 8.9 8.5* 8.5*  GFRNONAA >60   < > >60 45* >60 >60 >60  PROT 7.6  --  7.1 7.3  --   --   --   ALBUMIN 4.3  --  3.9 4.2  --   --   --   AST 12*  --  12* 10*  --   --   --   ALT 12  --  13 8  --   --   --   ALKPHOS 128*  --  108 117  --   --   --   BILITOT 0.3  --  0.9 0.3  --   --   --    < > = values in this interval not displayed.    Studies:  DG Abd Portable 1 View  Result Date: 07/09/2023 CLINICAL  DATA:  Nasogastric tube placement. EXAM: PORTABLE ABDOMEN - 1 VIEW COMPARISON:  Abdomen and pelvis CT dated 07/12/2023. FINDINGS: Normal bowel-gas pattern. Interval nasogastric tube with its tip and side hole in the proximal to mid stomach. Lower thoracic spine degenerative changes. IMPRESSION: Nasogastric tube tip and side hole in the proximal to mid stomach. Electronically Signed   By: Beckie Salts M.D.   On: 07/01/2023 17:09   DG Abdomen Acute W/Chest  Result Date: 07/08/2023 CLINICAL DATA:  Vomiting EXAM: DG ABDOMEN ACUTE WITH 1 VIEW CHEST COMPARISON:  07/10/2023. FINDINGS: There is no evidence of dilated bowel loops or free intraperitoneal air. No radiopaque calculi or other significant radiographic abnormality is seen. Heart size and mediastinal contours are within normal limits. Both lungs are clear. There are thoracic degenerative changes. Right-sided Port-A-Cath tip overlies distal SVC. IMPRESSION: Negative abdominal radiographs.  No acute cardiopulmonary disease. Electronically Signed   By: Layla Maw M.D.   On: 07/11/2023  14:40   CT ABDOMEN PELVIS W CONTRAST  Result Date: 07/12/2023 CLINICAL DATA:  Endometrial/uterine cancer.  Nausea and vomiting. * Tracking Code: BO * EXAM: CT ABDOMEN AND PELVIS WITH CONTRAST TECHNIQUE: Multidetector CT imaging of the abdomen and pelvis was performed using the standard protocol following bolus administration of intravenous contrast. RADIATION DOSE REDUCTION: This exam was performed according to the departmental dose-optimization program which includes automated exposure control, adjustment of the mA and/or kV according to patient size and/or use of iterative reconstruction technique. CONTRAST:  OMNIPAQUE IOHEXOL 300 MG/ML  SOLN COMPARISON:  CT abdomen/pelvis from 06/17/2023 FINDINGS: Lower chest: A catheter terminates in the right atrium on the top most image. Mild cardiomegaly. Hepatobiliary: Unremarkable Pancreas: Unremarkable Spleen: Unremarkable Adrenals/Urinary Tract: Unremarkable Stomach/Bowel: Diffuse gastric wall thickening with mild gastric and substantial proximal duodenal distension. After crossing the midline, the duodenum does not appear distended but there is substantial distal duodenal and jejunal wall thickening, worsened from prior exams. Scattered colonic diverticula. Appendix unremarkable. No pneumatosis. No hypoenhancement of bowel wall. There are abnormal contains small fluid collections along the small bowel mesentery, for example along the left abdomen on image 64 series 7 and along the right abdomen on image 55 series 7. In addition there is fluid density in the lesser sac on image 76 series 7. Vascular/Lymphatic: Mild atheromatous vascular disease of the aortoiliac tree. Patent celiac trunk and SMA. Patent superior mesenteric vein. Suspected left iliac adenopathy measuring about 1.3 cm in diameter on image 47 series 2, stable. This is of similar density to the adjacent vein. Reproductive: Uterus absent. Nodular enhancement along the left vaginal cuff measuring 2.5  by 1.7 cm, previously 2.3 by 1.7 cm by my measurements. Tumor in this location is not excluded. Other: There is some abnormal stranding along the remaining omentum for example on image 73 series 9. Small nodular deposit along the upper omentum on image 31 series 2. Musculoskeletal: Lower thoracic and lumbar spondylosis. Density within the umbilicus on image 102 series 9, potentially a small knuckle of bowel versus tumor. IMPRESSION: 1. Worsening of gastric wall thickening, distal duodenal wall thickening, and jejunal wall thickening. The stomach and proximal duodenum are distended. The appearance favors gastroenteritis. 2. There is also some abnormal stranding along the remaining omentum. There are also abnormal fluid collections along the small bowel mesentery and in the lesser sac. The appearance raises concern for possible residual peritoneal tumor causing fluid deposits. Small nodular deposit along the upper omentum along with nodularity in the umbilicus. 3. Nodular enhancement along the  left vaginal cuff measuring 2.5 by 1.7 cm, previously 2.3 by 1.7 cm by my measurements. This likely represents tumor. 4. Suspected left iliac adenopathy measuring about 1.3 cm in diameter, stable. 5. Mild cardiomegaly. 6. Scattered colonic diverticula. 7. Mild atheromatous vascular disease of the aortoiliac tree. 8. Lower thoracic and lumbar spondylosis. Electronically Signed   By: Gaylyn Rong M.D.   On: 07/12/2023 14:56   DG Abd 1 View  Result Date: 07/10/2023 CLINICAL DATA:  Abdominal pain, vomiting. History of uterine cancer. EXAM: ABDOMEN - 1 VIEW COMPARISON:  April 09, 2022. FINDINGS: The bowel gas pattern is normal. Surgical clip is seen over the left sacrum. No radio-opaque calculi or other significant radiographic abnormality are seen. IMPRESSION: No abnormal bowel dilatation. Electronically Signed   By: Lupita Raider M.D.   On: 07/10/2023 14:18   CT CHEST ABDOMEN PELVIS W CONTRAST  Result Date:  06/17/2023 CLINICAL DATA:  History of endometrial cancer/uterine cancer. Evaluate treatment response to chemotherapy. * Tracking Code: BO *. EXAM: CT CHEST, ABDOMEN, AND PELVIS WITH CONTRAST TECHNIQUE: Multidetector CT imaging of the chest, abdomen and pelvis was performed following the standard protocol during bolus administration of intravenous contrast. RADIATION DOSE REDUCTION: This exam was performed according to the departmental dose-optimization program which includes automated exposure control, adjustment of the mA and/or kV according to patient size and/or use of iterative reconstruction technique. CONTRAST:  OMNIPAQUE IOHEXOL 300 MG/ML  SOLN COMPARISON:  Multiple priors including most recent CT April 08, 2023 FINDINGS: CT CHEST FINDINGS Cardiovascular: Right chest Port-A-Cath with tip in the right atrium. Aortic atherosclerosis. No central pulmonary embolus on this nondedicated study. Normal size heart. No significant pericardial effusion/thickening. Mediastinum/Nodes: No suspicious thyroid nodule. No pathologically enlarged mediastinal, hilar or axillary lymph nodes. Right paratracheal lymph node now measures 5 mm in short axis on image 17/2 previously 7 mm prevascular lymph node now measures 3 mm in short axis on image 20/2 previously 5 mm the esophagus is grossly unremarkable. Lungs/Pleura: No suspicious pulmonary nodules or masses. No pleural effusion. No pneumothorax. Musculoskeletal: Similar appearance of the sclerotic lesions in the T2 and T8 vertebral bodies. No new aggressive lytic or blastic lesion of bone. CT ABDOMEN PELVIS FINDINGS Hepatobiliary: No suspicious hepatic lesion. Gallbladder is unremarkable. No biliary ductal dilation. Pancreas: No pancreatic ductal dilation or evidence of acute inflammation Spleen: No splenomegaly. Adrenals/Urinary Tract: Bilateral adrenal glands appear normal. No hydronephrosis. Kidneys demonstrate symmetric enhancement. Urinary bladder is minimally  distended limiting evaluation Stomach/Bowel: No radiopaque enteric contrast material was administered. Similar asymmetric thickening of the anterior gastric antrum measuring 15 mm on image 64/2, unchanged. No pathologic dilation of small or large bowel. Colonic diverticulosis without findings of acute diverticulitis. Vascular/Lymphatic: Aortic atherosclerosis. Normal caliber abdominal aorta. Smooth IVC contours. The portal, splenic and superior mesenteric veins are patent. Left external iliac lymph node measures 9 mm in short axis on image 102/2 previously 11 mm. -left obturator lymph node measures 2 mm in short axis on image 99/2 previously 4 mm. Reproductive: Uterus is surgically absent. Nodular soft tissue along the left side of the vaginal cuff now measures 2.1 x 1.8 cm on image 98/2 previously 3.3 x 2.4 cm. Other: Peritoneal/omental thickening and nodularity is decreased from prior. For instance in the central abdomen subjacent to the umbilicus nodular focus measures 10 mm on image 79/2 previously 2.2 cm. No significant abdominopelvic free fluid. Musculoskeletal: No new suspicious lytic or blastic lesion of bone. Multilevel degenerative changes spine. IMPRESSION: 1. Findings consistent with  treatment response including decreased size of the nodular soft tissue along the left side of the vaginal cuff, decreased size of the peritoneal/omental thickening and nodularity, and decreased size of the mediastinal, left external iliac and left obturator lymph nodes. 2. Similar appearance of the sclerotic lesions in the T2 and T8 vertebral bodies. No new suspicious osseous lesions. 3. Similar asymmetric thickening of the anterior gastric antrum measuring 15 mm, unchanged. 4.  Aortic Atherosclerosis (ICD10-I70.0). Electronically Signed   By: Maudry Mayhew M.D.   On: 06/17/2023 14:46

## 2023-07-17 NOTE — Progress Notes (Signed)
PROGRESS NOTE  Sherri Gill  DOB: [birth year]  PCP: Sherri Aschoff, MD NWG:[id number]  DOA: 07/01/2023  LOS: 3 days  Hospital Day: 4  Brief narrative: Sherri Gill is a 72 y.o. female with PMH significant for advanced uterine cancer (s/p TAH, b/l SOO) with peritoneal carcinomatosis on active chemotherapy/immunotherapy, PAF on Xarelto, T2DM, HTN, HLD, migraine, anxiety, GERD 9/14, patient presented to the ED with complaint of persistent nausea, vomiting for several days, with poor oral intake, abdominal discomfort. CT A/P showed distention and wall thickening of stomach, proximal duodenum, some abnormal stranding along the remaining omentum and abdominal fluid collections along the small bowel mesentery and in the lesser sac.  This was raising concern for possible residual peritoneal tumor causing fluid deposits.  Patient was able to tolerate food and discharged to home.  Her symptoms continue to worsen at home and hence she returned back to ED on 9/16.  In the ED, patient was afebrile, tachycardic, hemodynamically stable Labs with WC count 10.6, hemoglobin 10.6, BUN/creatinine 24/1.28, lipase 47 Abdominal x-ray was obtained which was negative for acute pathology EDP discussed with oncology Dr. Bertis Gill and GI Dr. Loreta Gill NG tube was placed after which patient started to feel better Admitted to [facility]  Subjective: Patient was seen and examined this morning.   Lying on bed.  Feels better after NG tube is out.  On clear liquid diet.  Passing gas no bowel movement yet.  Assessment and plan: Partial small bowel obstruction Presented with intractable nausea and vomiting in the setting of peritoneal carcinomatosis  Suspect possible malignant stricture of proximal small bowel CT abdomen 9/14 as above Decompressed with NG tube. 9/18, underwent EGD.  Normal-appearing, widely patent esophagus and GEJ, edematous gastric fold which were biopsied.  Single duodenal polyp, biopsied.  No visible  structural mass causing blockage.  NGT was removed.  Currently on clear liquid diet. Continue IV fluid Continue IV antiemetics and analgesics PRN  Advanced uterine cancer  (s/p TAH, b/l SOO) with peritoneal carcinomatosis on active chemotherapy/immunotherapy Follows with oncology, Dr. Bertis Gill.    Hypokalemia/hypomagnesemia Low potassium and magnesium level as below.  Potassium level was low at 2.2 today.  Replacement given. Recent Labs  Lab 07/12/23 1249 07/02/2023 1334 07/15/23 0320 06/29/2023 0515 07/17/23 0632  K 2.8* 3.6 3.1* 2.9* 3.2*  MG  --   --   --  1.5*  --   PHOS  --   --   --  3.0  --    Acute kidney injury: Mild due to the poor oral intake and GI losses.  Creatinine improving with hydration. Recent Labs    05/30/23 0812 06/20/23 0844 07/04/23 0956 07/07/23 1049 07/10/23 1331 07/12/23 1249 07/07/2023 1334 07/15/23 0320 07/04/2023 0515 07/17/23 0632  BUN 20 15 13 23 20 19  24* 17 10 6*  CREATININE 0.92 1.01* 0.81 0.90 0.87 0.82 1.28* 0.97 0.67 0.68   Paroxysmal atrial fibrillation Remains in sinus rhythm with controlled rate.   PTA meds- Cardizem 120 mg daily, Xarelto. Both on hold currently.  Heart rate and blood pressure stable. Plan to resume Xarelto once her bowel obstruction resolves and she is at no risk of surgery.   Hypertension: PTA meds- losartan 100 mg daily, Cardizem 120 mg daily Currently oral meds on hold.  Blood pressure stable. IV hydralazine as needed  Type 2 diabetes mellitus A1c 6.4 on April 2024 PTA meds-metformin 1000 mg twice daily, Currently on D5 NS drip.  Blood sugar trend as below. Continue SSI/Accu-Cheks.  Recent Labs  Lab 07/17/23 0034 07/17/23 0449 07/17/23 0736 07/17/23 1122 07/17/23 1633  GLUCAP 165* 98 125* 100* 101*   Anemia chronic disease Vitamin B12 deficiency Hemoglobin at baseline close to 9.  No active bleeding.  Continue to monitor. Resume vitamin B12 supplement when able to. Recent Labs    03/21/23 0912  03/25/23 1541 07/12/23 1249 04-Aug-2023 1334 07/15/23 0320 07/01/2023 0515 07/17/23 0632  HGB  --    < > 9.7* 10.6* 8.8* 8.7* 8.4*  MCV  --    < > 89.7 87.4 89.4 88.9 89.9  VITAMINB12 127*  --   --   --   --   --   --   FERRITIN 232  --   --   --   --   --   --   TIBC 260  --   --   --   --   --   --   IRON 42  --   --   --   --   --   --    < > = values in this interval not displayed.   Mobility: Independent at baseline.  Continue to encourage ambulation  Goals of care   Code Status: Full Code     DVT prophylaxis:  SCDs Start: 2023/08/04 2156   Antimicrobials: None Fluid: D5 NS at 125 mL/h to continue Consultants: Oncology, GI Family Communication: None at bedside  Status: Inpatient Level of care:  Med-Surg   Patient is from: Home Needs to continue in-hospital care: Bowel obstruction Anticipated d/c to: Pending clinical course      Diet:  Diet Order             DIET SOFT Room service appropriate? Yes; Fluid consistency: Thin  Diet effective now                   Scheduled Meds:  bisacodyl  10 mg Rectal Once   Chlorhexidine Gluconate Cloth  6 each Topical Daily   dorzolamide-timolol  1 drop Both Eyes BID   feeding supplement  1 Container Oral BID BM   insulin aspart  0-9 Units Subcutaneous Q4H   latanoprost  1 drop Both Eyes QHS   pantoprazole (PROTONIX) IV  40 mg Intravenous Daily    PRN meds: acetaminophen, alum & mag hydroxide-simeth, hydrALAZINE, HYDROmorphone (DILAUDID) injection, metoprolol tartrate, ondansetron (ZOFRAN) IV   Infusions:   dextrose 5 % and 0.9 % NaCl 125 mL/hr at 07/22/2023 1234   lactated ringers Stopped (07/15/2023 1502)    Antimicrobials: Anti-infectives (From admission, onward)    None       Objective: Vitals:   07/17/23 0446 07/17/23 1333  BP: 138/61 134/75  Pulse: 72 75  Resp: 18 18  Temp: 98.5 F (36.9 C) 97.9 F (36.6 C)  SpO2: 95% 98%   No intake or output data in the 24 hours ending 07/17/23 1659  Filed  Weights   07/04/2023 0410 06/29/2023 1300  Weight: 80.9 kg 80.9 kg   Weight change: 0 kg Body mass index is 31.59 kg/m.   Physical Exam: General exam: Pleasant elderly African-American female. Skin: No rashes, lesions or ulcers. HEENT: Atraumatic, normocephalic, no obvious bleeding Lungs: Clear to auscultate bilaterally CVS: Regular rate and rhythm, no murmur GI/Abd soft, distention improved, mild tenderness generalized, bowel sound present but remains sluggish CNS: Alert, awake, oriented x 3 Psychiatry: Mood appropriate Extremities: No edema, no calf tenderness  Data Review: I have personally reviewed the laboratory data and  studies available.  F/u labs ordered Unresulted Labs (From admission, onward)     Start     Ordered   2023/08/01 0500  CBC with Differential/Platelet  Daily,   R     Question:  Specimen collection method  Answer:  Unit=Unit collect   07/15/23 1019   Aug 01, 2023 0500  Basic metabolic panel  Daily,   R     Question:  Specimen collection method  Answer:  Unit=Unit collect   07/15/23 1019            Total time spent in review of labs and imaging, patient evaluation, formulation of plan, documentation and communication with family: 45 minutes  Signed, Lorin Glass, MD [facility] 07/17/2023

## 2023-07-17 NOTE — Progress Notes (Signed)
Chaplain met with Sherri Gill to assist with filling out her HCPOA and living will documents.  As chaplain was reviewing the documents with her, her dinner arrived and she was very excited to eat after several days of not getting to.  One of the chaplains will return tomorrow to assist her.  [address], Bcc Pager, [phone]

## 2023-07-17 NOTE — Progress Notes (Signed)
Patient had concerns about not having her Xarelto and hypertensive medications. Patient is tolerating clear liquids.

## 2023-07-17 NOTE — Progress Notes (Signed)
Subjective: Tolerated clear liquid diet.  Objective: Vital signs in last 24 hours: Temp:  [97.9 F (36.6 C)-98.5 F (36.9 C)] 97.9 F (36.6 C) (09/19 1333) Pulse Rate:  [72-75] 75 (09/19 1333) Resp:  [18] 18 (09/19 1333) BP: (125-138)/(61-75) 134/75 (09/19 1333) SpO2:  [94 %-98 %] 98 % (09/19 1333) Last BM Date : 07/13/23 (LBM 4 days - per patient)  Intake/Output from previous day: 09/18 0701 - 09/19 0700 In: 570 [P.O.:120; I.V.:450] Out: 1700 [Urine:450; Emesis/NG output:1250] Intake/Output this shift: No intake/output data recorded.  General appearance: alert and no distress GI: soft, non-tender; bowel sounds normal; no masses,  no organomegaly  Lab Results: Recent Labs    07/15/23 0320 07/16/23 0515 07/17/23 0632  WBC 8.5 8.1 7.3  HGB 8.8* 8.7* 8.4*  HCT 28.8* 28.1* 27.6*  PLT 299 278 257   BMET Recent Labs    07/15/23 0320 07/16/23 0515 07/17/23 0632  NA 137 135 138  K 3.1* 2.9* 3.2*  CL 102 102 106  CO2 27 25 25   GLUCOSE 93 139* 120*  BUN 17 10 6*  CREATININE 0.97 0.67 0.68  CALCIUM 8.9 8.5* 8.5*   LFT No results for input(s): "PROT", "ALBUMIN", "AST", "ALT", "ALKPHOS", "BILITOT", "BILIDIR", "IBILI" in the last 72 hours. PT/INR No results for input(s): "LABPROT", "INR" in the last 72 hours. Hepatitis Panel No results for input(s): "HEPBSAG", "HCVAB", "HEPAIGM", "HEPBIGM" in the last 72 hours. C-Diff No results for input(s): "CDIFFTOX" in the last 72 hours. Fecal Lactopherrin No results for input(s): "FECLLACTOFRN" in the last 72 hours.  Studies/Results: No results found.  Medications: Scheduled:  bisacodyl  10 mg Rectal Once   Chlorhexidine Gluconate Cloth  6 each Topical Daily   dorzolamide-timolol  1 drop Both Eyes BID   feeding supplement  1 Container Oral BID BM   insulin aspart  0-9 Units Subcutaneous Q4H   latanoprost  1 drop Both Eyes QHS   pantoprazole (PROTONIX) IV  40 mg Intravenous Daily   Continuous:  dextrose 5 % and 0.9 %  NaCl 125 mL/hr at 07/16/23 1234   lactated ringers Stopped (07/16/23 1502)    Assessment/Plan: 1) Partial SBO. 2) Recurrent uterine cancer.   The upper GI mucosal biopsies were negative for malignancy.  A duodenal adenoma was identified.  She was able to tolerate a clear liquid diet.  Plan: 1) Advance to a soft diet.  LOS: 3 days   Rosaleigh Brazzel D 07/17/2023, 4:34 PM

## 2023-07-17 NOTE — Care Management Important Message (Signed)
Important Message  Patient Details IM Letter given. Name: Sherri Gill MRN: [id number] Date of Birth: 1951-03-16   Medicare Important Message Given:  Yes     Caren Macadam 07/17/2023, 4:00 PM

## 2023-07-18 ENCOUNTER — Ambulatory Visit (HOSPITAL_COMMUNITY): Payer: Medicare HMO

## 2023-07-18 ENCOUNTER — Inpatient Hospital Stay (HOSPITAL_COMMUNITY): Payer: Medicare HMO

## 2023-07-18 ENCOUNTER — Ambulatory Visit: Payer: Medicare HMO | Admitting: Hematology and Oncology

## 2023-07-18 DIAGNOSIS — K219 Gastro-esophageal reflux disease without esophagitis: Secondary | ICD-10-CM | POA: Diagnosis not present

## 2023-07-18 DIAGNOSIS — C549 Malignant neoplasm of corpus uteri, unspecified: Secondary | ICD-10-CM | POA: Diagnosis not present

## 2023-07-18 DIAGNOSIS — R933 Abnormal findings on diagnostic imaging of other parts of digestive tract: Secondary | ICD-10-CM | POA: Diagnosis not present

## 2023-07-18 DIAGNOSIS — R112 Nausea with vomiting, unspecified: Secondary | ICD-10-CM | POA: Diagnosis not present

## 2023-07-18 DIAGNOSIS — K449 Diaphragmatic hernia without obstruction or gangrene: Secondary | ICD-10-CM | POA: Diagnosis not present

## 2023-07-18 DIAGNOSIS — K311 Adult hypertrophic pyloric stenosis: Secondary | ICD-10-CM | POA: Diagnosis not present

## 2023-07-18 LAB — GLUCOSE, CAPILLARY
Glucose-Capillary: 106 mg/dL — ABNORMAL HIGH (ref 70–99)
Glucose-Capillary: 108 mg/dL — ABNORMAL HIGH (ref 70–99)
Glucose-Capillary: 109 mg/dL — ABNORMAL HIGH (ref 70–99)
Glucose-Capillary: 109 mg/dL — ABNORMAL HIGH (ref 70–99)
Glucose-Capillary: 132 mg/dL — ABNORMAL HIGH (ref 70–99)
Glucose-Capillary: 75 mg/dL (ref 70–99)

## 2023-07-18 LAB — CBC WITH DIFFERENTIAL/PLATELET
Abs Immature Granulocytes: 0.02 10*3/uL (ref 0.00–0.07)
Basophils Absolute: 0 10*3/uL (ref 0.0–0.1)
Basophils Relative: 0 %
Eosinophils Absolute: 0.1 10*3/uL (ref 0.0–0.5)
Eosinophils Relative: 1 %
HCT: 29.6 % — ABNORMAL LOW (ref 36.0–46.0)
Hemoglobin: 8.8 g/dL — ABNORMAL LOW (ref 12.0–15.0)
Immature Granulocytes: 0 %
Lymphocytes Relative: 16 %
Lymphs Abs: 1.3 10*3/uL (ref 0.7–4.0)
MCH: 26.9 pg (ref 26.0–34.0)
MCHC: 29.7 g/dL — ABNORMAL LOW (ref 30.0–36.0)
MCV: 90.5 fL (ref 80.0–100.0)
Monocytes Absolute: 0.8 10*3/uL (ref 0.1–1.0)
Monocytes Relative: 10 %
Neutro Abs: 5.9 10*3/uL (ref 1.7–7.7)
Neutrophils Relative %: 73 %
Platelets: 266 10*3/uL (ref 150–400)
RBC: 3.27 MIL/uL — ABNORMAL LOW (ref 3.87–5.11)
RDW: 16 % — ABNORMAL HIGH (ref 11.5–15.5)
WBC: 8 10*3/uL (ref 4.0–10.5)
nRBC: 0 % (ref 0.0–0.2)

## 2023-07-18 LAB — BASIC METABOLIC PANEL
Anion gap: 6 (ref 5–15)
BUN: 7 mg/dL — ABNORMAL LOW (ref 8–23)
CO2: 23 mmol/L (ref 22–32)
Calcium: 8.4 mg/dL — ABNORMAL LOW (ref 8.9–10.3)
Chloride: 106 mmol/L (ref 98–111)
Creatinine, Ser: 0.7 mg/dL (ref 0.44–1.00)
GFR, Estimated: >60 mL/min (ref >60)
Glucose, Bld: 109 mg/dL — ABNORMAL HIGH (ref 70–99)
Potassium: 3.4 mmol/L — ABNORMAL LOW (ref 3.5–5.1)
Sodium: 135 mmol/L (ref 135–145)

## 2023-07-18 MED ORDER — FAMOTIDINE IN NACL 20-0.9 MG/50ML-% IV SOLN
20.0000 mg | Freq: Every day | INTRAVENOUS | Status: DC
  Administered 2023-07-18 – 2023-08-01 (×15): 20 mg via INTRAVENOUS
  Filled 2023-07-18 (×15): qty 50

## 2023-07-18 MED ORDER — POTASSIUM CHLORIDE CRYS ER 20 MEQ PO TBCR
40.0000 meq | EXTENDED_RELEASE_TABLET | Freq: Once | ORAL | Status: AC
  Administered 2023-07-18: 40 meq via ORAL
  Filled 2023-07-18: qty 2

## 2023-07-18 MED ORDER — FAMOTIDINE IN NACL 20-0.9 MG/50ML-% IV SOLN: 20.0000 mg | INTRAVENOUS | Status: DC

## 2023-07-18 NOTE — Progress Notes (Signed)
Chaplain assisted Sherri Gill in getting her HCPOA documents notarized and witnessed.  The original was returned to [facility] and a copy was placed in her chart and scanned to ACP documents.   [address] Bcc Pager, [phone]

## 2023-07-18 NOTE — Plan of Care (Signed)
Problem: Education: Goal: Knowledge of General Education information will improve Description: Including pain rating scale, medication(s)/side effects and non-pharmacologic comfort measures Outcome: Progressing   Problem: Clinical Measurements: Goal: Will remain free from infection Outcome: Progressing   Problem: Elimination: Goal: Will not experience complications related to bowel motility Outcome: Progressing Goal: Will not experience complications related to urinary retention Outcome: Progressing   Problem: Skin Integrity: Goal: Risk for impaired skin integrity will decrease Outcome: Progressing

## 2023-07-18 NOTE — Progress Notes (Signed)
Chaplain engaged in a follow-up visit with Sherri Gill. She voiced that it wasn't a good time to go over Advanced Directive paperwork as she had come from radiology and was waiting on food. Chaplain offered understanding and let her know how to reach Chaplain when she is ready to complete paperwork.     07/18/23 1100  Spiritual Encounters  Type of Visit Follow up  Care provided to: Patient  Reason for visit Advance directives

## 2023-07-18 NOTE — Progress Notes (Signed)
Patient complaining of a burning pain in her abdomen. Gave PRN Maalox at 2137. Patient states it worsened overnight. Gave PRN 0.5 mg Dilaudid IV and patient complained of nausea. Administered Zofran 4 mg IV and patient reports it has gradually subsided but want to make sure doctor is aware.

## 2023-07-18 NOTE — Progress Notes (Signed)
Sherri Gill   DOB:1951-05-07   ZO#:[id number]    ASSESSMENT & PLAN:  Recurrent uterine cancer She presents with partial small bowel obstruction Appreciate GI consult Already feeling better with NG tube decompression and IV fluids I reviewed the operative note from her EGD and biopsy results Biopsy is taken but no signs of malignant obstruction, negative for malignancy Continue supportive care   Nausea, vomiting and dehydration She is feeling better with nothing by mouth, IV fluids and antiemetics as needed NG tube was removed She will continue antiemetics as needed and slowly advance diet with clear liquid diet There is a component of GERD Recommend adding Pepcid in the evening Appreciate GI consult for further evaluation with different imaging   Anemia chronic disease She is not symptomatic Observe only   Dehydration, mild hypokalemia and other electrolyte imbalance Likely due to poor oral intake Replace as needed   Discharge planning Unknown, but she would likely be here until next week due to inability to tolerate diet  All questions were answered. The patient knows to call the clinic with any problems, questions or concerns.   The total time spent in the appointment was 40 minutes encounter with patients including review of chart and various tests results, discussions about plan of care and coordination of care plan  Artis Delay, MD 07/18/2023 7:15 AM  Subjective:  She had recurrent nausea vomiting this morning.  She had significant intermittent belching and reflux sensation despite high-dose Protonix.  Tolerated minimum liquid diet yesterday  Objective:  Vitals:   07/17/23 2017 07/18/23 0602  BP: 136/67 129/70  Pulse: 81 71  Resp: 18   Temp: 99.1 F (37.3 C) (!) 97.5 F (36.4 C)  SpO2: 96% 96%     Intake/Output Summary (Last 24 hours) at 07/18/2023 0715 Last data filed at 07/17/2023 1800 Gross per 24 hour  Intake 120 ml  Output --  Net 120 ml     GENERAL:alert, no distress and comfortable    Labs:  Recent Labs    07/10/23 1331 07/10/23 1331 07/12/23 1249 07/12/2023 1334 07/15/23 0320 07/08/2023 0515 07/17/23 0632 07/18/23 0525  NA 134*  --  134* 135   < > 135 138 135  K 3.8  --  2.8* 3.6   < > 2.9* 3.2* 3.4*  CL 96*  --  96* 97*   < > 102 106 106  CO2 28  --  27 28   < > 25 25 23   GLUCOSE 105*  --  110* 104*   < > 139* 120* 109*  BUN 20  --  19 24*   < > 10 6* 7*  CREATININE 0.87  --  0.82 1.28*   < > 0.67 0.68 0.70  CALCIUM 10.1  --  9.6 9.8   < > 8.5* 8.5* 8.4*  GFRNONAA >60   < > >60 45*   < > >60 >60 >60  PROT 7.6  --  7.1 7.3  --   --   --   --   ALBUMIN 4.3  --  3.9 4.2  --   --   --   --   AST 12*  --  12* 10*  --   --   --   --   ALT 12  --  13 8  --   --   --   --   ALKPHOS 128*  --  108 117  --   --   --   --  BILITOT 0.3  --  0.9 0.3  --   --   --   --    < > = values in this interval not displayed.    Studies:  DG Abd Portable 1 View  Result Date: 07/13/2023 CLINICAL DATA:  Nasogastric tube placement. EXAM: PORTABLE ABDOMEN - 1 VIEW COMPARISON:  Abdomen and pelvis CT dated 07/12/2023. FINDINGS: Normal bowel-gas pattern. Interval nasogastric tube with its tip and side hole in the proximal to mid stomach. Lower thoracic spine degenerative changes. IMPRESSION: Nasogastric tube tip and side hole in the proximal to mid stomach. Electronically Signed   By: Beckie Salts M.D.   On: 06/29/2023 17:09   DG Abdomen Acute W/Chest  Result Date: 07/25/2023 CLINICAL DATA:  Vomiting EXAM: DG ABDOMEN ACUTE WITH 1 VIEW CHEST COMPARISON:  07/10/2023. FINDINGS: There is no evidence of dilated bowel loops or free intraperitoneal air. No radiopaque calculi or other significant radiographic abnormality is seen. Heart size and mediastinal contours are within normal limits. Both lungs are clear. There are thoracic degenerative changes. Right-sided Port-A-Cath tip overlies distal SVC. IMPRESSION: Negative abdominal radiographs.  No  acute cardiopulmonary disease. Electronically Signed   By: Layla Maw M.D.   On: 07/01/2023 14:40   CT ABDOMEN PELVIS W CONTRAST  Result Date: 07/12/2023 CLINICAL DATA:  Endometrial/uterine cancer.  Nausea and vomiting. * Tracking Code: BO * EXAM: CT ABDOMEN AND PELVIS WITH CONTRAST TECHNIQUE: Multidetector CT imaging of the abdomen and pelvis was performed using the standard protocol following bolus administration of intravenous contrast. RADIATION DOSE REDUCTION: This exam was performed according to the departmental dose-optimization program which includes automated exposure control, adjustment of the mA and/or kV according to patient size and/or use of iterative reconstruction technique. CONTRAST:  OMNIPAQUE IOHEXOL 300 MG/ML  SOLN COMPARISON:  CT abdomen/pelvis from 06/17/2023 FINDINGS: Lower chest: A catheter terminates in the right atrium on the top most image. Mild cardiomegaly. Hepatobiliary: Unremarkable Pancreas: Unremarkable Spleen: Unremarkable Adrenals/Urinary Tract: Unremarkable Stomach/Bowel: Diffuse gastric wall thickening with mild gastric and substantial proximal duodenal distension. After crossing the midline, the duodenum does not appear distended but there is substantial distal duodenal and jejunal wall thickening, worsened from prior exams. Scattered colonic diverticula. Appendix unremarkable. No pneumatosis. No hypoenhancement of bowel wall. There are abnormal contains small fluid collections along the small bowel mesentery, for example along the left abdomen on image 64 series 7 and along the right abdomen on image 55 series 7. In addition there is fluid density in the lesser sac on image 76 series 7. Vascular/Lymphatic: Mild atheromatous vascular disease of the aortoiliac tree. Patent celiac trunk and SMA. Patent superior mesenteric vein. Suspected left iliac adenopathy measuring about 1.3 cm in diameter on image 47 series 2, stable. This is of similar density to the adjacent  vein. Reproductive: Uterus absent. Nodular enhancement along the left vaginal cuff measuring 2.5 by 1.7 cm, previously 2.3 by 1.7 cm by my measurements. Tumor in this location is not excluded. Other: There is some abnormal stranding along the remaining omentum for example on image 73 series 9. Small nodular deposit along the upper omentum on image 31 series 2. Musculoskeletal: Lower thoracic and lumbar spondylosis. Density within the umbilicus on image 102 series 9, potentially a small knuckle of bowel versus tumor. IMPRESSION: 1. Worsening of gastric wall thickening, distal duodenal wall thickening, and jejunal wall thickening. The stomach and proximal duodenum are distended. The appearance favors gastroenteritis. 2. There is also some abnormal stranding along the remaining omentum. There  are also abnormal fluid collections along the small bowel mesentery and in the lesser sac. The appearance raises concern for possible residual peritoneal tumor causing fluid deposits. Small nodular deposit along the upper omentum along with nodularity in the umbilicus. 3. Nodular enhancement along the left vaginal cuff measuring 2.5 by 1.7 cm, previously 2.3 by 1.7 cm by my measurements. This likely represents tumor. 4. Suspected left iliac adenopathy measuring about 1.3 cm in diameter, stable. 5. Mild cardiomegaly. 6. Scattered colonic diverticula. 7. Mild atheromatous vascular disease of the aortoiliac tree. 8. Lower thoracic and lumbar spondylosis. Electronically Signed   By: Gaylyn Rong M.D.   On: 07/12/2023 14:56   DG Abd 1 View  Result Date: 07/10/2023 CLINICAL DATA:  Abdominal pain, vomiting. History of uterine cancer. EXAM: ABDOMEN - 1 VIEW COMPARISON:  April 09, 2022. FINDINGS: The bowel gas pattern is normal. Surgical clip is seen over the left sacrum. No radio-opaque calculi or other significant radiographic abnormality are seen. IMPRESSION: No abnormal bowel dilatation. Electronically Signed   By: Lupita Raider M.D.   On: 07/10/2023 14:18

## 2023-07-18 NOTE — Progress Notes (Signed)
Subjective: She complains of GERD.  A good response was achieved with Zofran and Maalox.  Objective: Vital signs in last 24 hours: Temp:  [97.5 F (36.4 C)-99.1 F (37.3 C)] 97.5 F (36.4 C) (09/20 0602) Pulse Rate:  [71-81] 71 (09/20 0602) Resp:  [18] 18 (09/19 2017) BP: (129-136)/(67-75) 129/70 (09/20 0602) SpO2:  [96 %-98 %] 96 % (09/20 0602) Last BM Date : 07/17/23  Intake/Output from previous day: 09/19 0701 - 09/20 0700 In: 120 [P.O.:120] Out: -  Intake/Output this shift: No intake/output data recorded.  General appearance: alert and no distress GI: soft, no tympany, hypoactive bowel sounds  Lab Results: Recent Labs    07/16/23 0515 07/17/23 0632 07/18/23 0525  WBC 8.1 7.3 8.0  HGB 8.7* 8.4* 8.8*  HCT 28.1* 27.6* 29.6*  PLT 278 257 266   BMET Recent Labs    07/16/23 0515 07/17/23 0632 07/18/23 0525  NA 135 138 135  K 2.9* 3.2* 3.4*  CL 102 106 106  CO2 25 25 23   GLUCOSE 139* 120* 109*  BUN 10 6* 7*  CREATININE 0.67 0.68 0.70  CALCIUM 8.5* 8.5* 8.4*   LFT No results for input(s): "PROT", "ALBUMIN", "AST", "ALT", "ALKPHOS", "BILITOT", "BILIDIR", "IBILI" in the last 72 hours. PT/INR No results for input(s): "LABPROT", "INR" in the last 72 hours. Hepatitis Panel No results for input(s): "HEPBSAG", "HCVAB", "HEPAIGM", "HEPBIGM" in the last 72 hours. C-Diff No results for input(s): "CDIFFTOX" in the last 72 hours. Fecal Lactopherrin No results for input(s): "FECLLACTOFRN" in the last 72 hours.  Studies/Results: No results found.  Medications: Scheduled:  bisacodyl  10 mg Rectal Once   Chlorhexidine Gluconate Cloth  6 each Topical Daily   dorzolamide-timolol  1 drop Both Eyes BID   feeding supplement  1 Container Oral BID BM   insulin aspart  0-9 Units Subcutaneous Q4H   latanoprost  1 drop Both Eyes QHS   pantoprazole (PROTONIX) IV  40 mg Intravenous BID AC   Continuous:  dextrose 5 % and 0.9 % NaCl 125 mL/hr at 07/18/23 0057   lactated  ringers Stopped (07/16/23 1502)    Assessment/Plan: 1) Presumed SBO. 2) Metastatic uterine cancer.   It is not clear if she is able to tolerate PO.  Further imaging will be obtained.  Plan: 1) UGI series.  LOS: 4 days   Sherri Gill D 07/18/2023, 7:15 AM

## 2023-07-18 NOTE — Progress Notes (Signed)
PROGRESS NOTE  TASHAWNA CAPOZZA  DOB: Dec 17, 1950  PCP: [name], MD ZOX:[id number]  DOA: 07/07/2023  LOS: 4 days  Hospital Day: 5  Brief narrative: Sherri Gill is a 72 y.o. female with PMH significant for advanced uterine cancer (s/p TAH, b/l SOO) with peritoneal carcinomatosis on active chemotherapy/immunotherapy, PAF on Xarelto, T2DM, HTN, HLD, migraine, anxiety, GERD 9/14, patient presented to the ED with complaint of persistent nausea, vomiting for several days, with poor oral intake, abdominal discomfort. CT A/P showed distention and wall thickening of stomach, proximal duodenum, some abnormal stranding along the remaining omentum and abdominal fluid collections along the small bowel mesentery and in the lesser sac.  This was raising concern for possible residual peritoneal tumor causing fluid deposits.  Patient was able to tolerate food and discharged to home.  Her symptoms continue to worsen at home and hence she returned back to ED on 9/16.  In the ED, patient was afebrile, tachycardic, hemodynamically stable Labs with WC count 10.6, hemoglobin 10.6, BUN/creatinine 24/1.28, lipase 47 Abdominal x-ray was obtained which was negative for acute pathology EDP discussed with oncology Dr. [name] and GI Dr. [name] NG tube was placed after which patient started to feel better Admitted to [facility]  Subjective: Patient was seen and examined this morning.  Sitting up in recliner. No vomiting but continues to feel fullness and pressure in the epigastrium.  Reports he had a bowel vomit yesterday afternoon. Upper abdominal series ordered for today.  Assessment and plan: Partial small bowel obstruction Presented with intractable nausea and vomiting in the setting of peritoneal carcinomatosis  Suspect possible malignant stricture of proximal small bowel CT abdomen 9/14 as above Decompressed with NG tube. 9/18, underwent EGD.  Normal-appearing, widely patent esophagus and GEJ,  edematous gastric fold which were biopsied.  Single duodenal polyp, biopsied.  No visible structural mass causing blockage.  NGT was removed.   Patient reports she had a bowel movement yesterday afternoon but still feels full.  Upper GI series pending today. Continue IV fluid Continue IV antiemetics and analgesics PRN  Advanced uterine cancer  (s/p TAH, b/l SOO) with peritoneal carcinomatosis on active chemotherapy/immunotherapy Follows with oncology, Dr. [name].    Hypokalemia/hypomagnesemia Potassium level low at 3.4 today.  Replacement given. Recent Labs  Lab 07/22/2023 1334 07/15/23 0320 07/05/2023 0515 07/17/23 0632 07/18/23 0525  K 3.6 3.1* 2.9* 3.2* 3.4*  MG  --   --  1.5*  --   --   PHOS  --   --  3.0  --   --    Acute kidney injury: Mild due to the poor oral intake and GI losses.  Creatinine improving with hydration. Recent Labs    06/20/23 0844 07/04/23 0956 07/07/23 1049 07/10/23 1331 07/12/23 1249 07/18/2023 1334 07/15/23 0320 07/01/2023 0515 07/17/23 0632 07/18/23 0525  BUN 15 13 23 20 19  24* 17 10 6* 7*  CREATININE 1.01* 0.81 0.90 0.87 0.82 1.28* 0.97 0.67 0.68 0.70   Paroxysmal atrial fibrillation Remains in sinus rhythm with controlled rate.   PTA meds- Cardizem 120 mg daily, Xarelto. Both on hold currently.  Heart rate and blood pressure stable. Plan to resume Xarelto only when her bowel obstruction resolves and she is at no risk of surgery.   Hypertension: PTA meds- losartan 100 mg daily, Cardizem 120 mg daily Currently oral meds on hold.  Blood pressure stable. IV hydralazine as needed  Type 2 diabetes mellitus A1c 6.4 on April 2024 PTA meds-metformin 1000 mg twice daily,  Currently on D5 NS drip.  Blood sugar trend as below. Continue SSI/Accu-Cheks.   Recent Labs  Lab 07/17/23 2014 07/18/23 0004 07/18/23 0404 07/18/23 0738 07/18/23 1158  GLUCAP 125* 108* 132* 109* 75   Anemia chronic disease Vitamin B12 deficiency Hemoglobin at baseline  close to 9.  No active bleeding.  Continue to monitor. Resume vitamin B12 supplement when able to. Recent Labs    03/21/23 0912 03/25/23 1541 07/25/2023 1334 07/15/23 0320 07/18/2023 0515 07/17/23 0632 07/18/23 0525  HGB  --    < > 10.6* 8.8* 8.7* 8.4* 8.8*  MCV  --    < > 87.4 89.4 88.9 89.9 90.5  VITAMINB12 127*  --   --   --   --   --   --   FERRITIN 232  --   --   --   --   --   --   TIBC 260  --   --   --   --   --   --   IRON 42  --   --   --   --   --   --    < > = values in this interval not displayed.   Mobility: Independent at baseline.  Continue to encourage ambulation  Goals of care   Code Status: Full Code     DVT prophylaxis:  SCDs Start: 07/13/2023 2156   Antimicrobials: None Fluid: D5 NS at 75 mL/h to continue Consultants: Oncology, GI Family Communication: None at bedside  Status: Inpatient Level of care:  Med-Surg   Patient is from: Home Needs to continue in-hospital care: Bowel obstruction Anticipated d/c to: Pending clinical course      Diet:  Diet Order             Diet full liquid Room service appropriate? Yes; Fluid consistency: Thin  Diet effective now                   Scheduled Meds:  bisacodyl  10 mg Rectal Once   Chlorhexidine Gluconate Cloth  6 each Topical Daily   dorzolamide-timolol  1 drop Both Eyes BID   feeding supplement  1 Container Oral BID BM   insulin aspart  0-9 Units Subcutaneous Q4H   latanoprost  1 drop Both Eyes QHS   pantoprazole (PROTONIX) IV  40 mg Intravenous BID AC    PRN meds: acetaminophen, alum & mag hydroxide-simeth, hydrALAZINE, HYDROmorphone (DILAUDID) injection, metoprolol tartrate, ondansetron (ZOFRAN) IV   Infusions:   dextrose 5 % and 0.9 % NaCl 125 mL/hr at 07/18/23 1225   famotidine (PEPCID) IV     lactated ringers Stopped (07/24/2023 1502)    Antimicrobials: Anti-infectives (From admission, onward)    None       Objective: Vitals:   07/17/23 2017 07/18/23 0602  BP: 136/67  129/70  Pulse: 81 71  Resp: 18   Temp: 99.1 F (37.3 C) (!) 97.5 F (36.4 C)  SpO2: 96% 96%    Intake/Output Summary (Last 24 hours) at 07/18/2023 1324 Last data filed at 07/17/2023 1800 Gross per 24 hour  Intake 120 ml  Output --  Net 120 ml    Filed Weights   07/01/2023 0410 07/18/2023 1300  Weight: 80.9 kg 80.9 kg   Weight change:  Body mass index is 31.59 kg/m.   Physical Exam: General exam: Pleasant elderly African-American female. Skin: No rashes, lesions or ulcers. HEENT: Atraumatic, normocephalic, no obvious bleeding Lungs: Clear to auscultate bilaterally CVS: Regular  rate and rhythm, no murmur GI/Abd soft, feels distended.  Continues to have mild tenderness generalized, bowel sound present but remains sluggish CNS: Alert, awake, oriented x 3 Psychiatry: Mood appropriate Extremities: No edema, no calf tenderness  Data Review: I have personally reviewed the laboratory data and studies available.  F/u labs ordered Unresulted Labs (From admission, onward)     Start     Ordered   07/19/23 0500  Basic metabolic panel  Tomorrow morning,   R       Question:  Specimen collection method  Answer:  Unit=Unit collect   07/18/23 1324   07/19/23 0500  CBC with Differential/Platelet  Tomorrow morning,   R       Question:  Specimen collection method  Answer:  Unit=Unit collect   07/18/23 1324            Total time spent in review of labs and imaging, patient evaluation, formulation of plan, documentation and communication with family: 45 minutes  Signed, Lorin Glass, MD [facility] 07/18/2023

## 2023-07-19 ENCOUNTER — Inpatient Hospital Stay (HOSPITAL_COMMUNITY): Payer: Medicare HMO

## 2023-07-19 ENCOUNTER — Encounter (HOSPITAL_COMMUNITY): Payer: Self-pay | Admitting: Gastroenterology

## 2023-07-19 DIAGNOSIS — K311 Adult hypertrophic pyloric stenosis: Secondary | ICD-10-CM | POA: Diagnosis not present

## 2023-07-19 DIAGNOSIS — K56609 Unspecified intestinal obstruction, unspecified as to partial versus complete obstruction: Secondary | ICD-10-CM | POA: Diagnosis not present

## 2023-07-19 LAB — GLUCOSE, CAPILLARY
Glucose-Capillary: 108 mg/dL — ABNORMAL HIGH (ref 70–99)
Glucose-Capillary: 110 mg/dL — ABNORMAL HIGH (ref 70–99)
Glucose-Capillary: 112 mg/dL — ABNORMAL HIGH (ref 70–99)
Glucose-Capillary: 123 mg/dL — ABNORMAL HIGH (ref 70–99)
Glucose-Capillary: 126 mg/dL — ABNORMAL HIGH (ref 70–99)
Glucose-Capillary: 88 mg/dL (ref 70–99)

## 2023-07-19 LAB — BASIC METABOLIC PANEL
Anion gap: 6 (ref 5–15)
BUN: 8 mg/dL (ref 8–23)
CO2: 24 mmol/L (ref 22–32)
Calcium: 8.5 mg/dL — ABNORMAL LOW (ref 8.9–10.3)
Chloride: 106 mmol/L (ref 98–111)
Creatinine, Ser: 0.73 mg/dL (ref 0.44–1.00)
GFR, Estimated: >60 mL/min (ref >60)
Glucose, Bld: 165 mg/dL — ABNORMAL HIGH (ref 70–99)
Potassium: 3.6 mmol/L (ref 3.5–5.1)
Sodium: 136 mmol/L (ref 135–145)

## 2023-07-19 LAB — CBC WITH DIFFERENTIAL/PLATELET
Abs Immature Granulocytes: 0.05 10*3/uL (ref 0.00–0.07)
Basophils Absolute: 0 10*3/uL (ref 0.0–0.1)
Basophils Relative: 0 %
Eosinophils Absolute: 0.1 10*3/uL (ref 0.0–0.5)
Eosinophils Relative: 1 %
HCT: 27.1 % — ABNORMAL LOW (ref 36.0–46.0)
Hemoglobin: 8.2 g/dL — ABNORMAL LOW (ref 12.0–15.0)
Immature Granulocytes: 1 %
Lymphocytes Relative: 18 %
Lymphs Abs: 1.4 10*3/uL (ref 0.7–4.0)
MCH: 27.5 pg (ref 26.0–34.0)
MCHC: 30.3 g/dL (ref 30.0–36.0)
MCV: 90.9 fL (ref 80.0–100.0)
Monocytes Absolute: 1.2 10*3/uL — ABNORMAL HIGH (ref 0.1–1.0)
Monocytes Relative: 15 %
Neutro Abs: 5 10*3/uL (ref 1.7–7.7)
Neutrophils Relative %: 65 %
Platelets: 245 10*3/uL (ref 150–400)
RBC: 2.98 MIL/uL — ABNORMAL LOW (ref 3.87–5.11)
RDW: 16.1 % — ABNORMAL HIGH (ref 11.5–15.5)
WBC: 7.7 10*3/uL (ref 4.0–10.5)
nRBC: 0 % (ref 0.0–0.2)

## 2023-07-19 MED ORDER — RIVAROXABAN 20 MG PO TABS
20.0000 mg | ORAL_TABLET | Freq: Every day | ORAL | Status: DC
  Administered 2023-07-19 – 2023-07-22 (×4): 20 mg via ORAL
  Filled 2023-07-19 (×4): qty 1

## 2023-07-19 MED ORDER — DEXTROSE-SODIUM CHLORIDE 5-0.9 % IV SOLN
INTRAVENOUS | Status: DC
  Administered 2023-07-19: 50 mL/h via INTRAVENOUS

## 2023-07-19 MED ORDER — ALUM & MAG HYDROXIDE-SIMETH 200-200-20 MG/5ML PO SUSP
30.0000 mL | Freq: Four times a day (QID) | ORAL | Status: DC
  Administered 2023-07-19 – 2023-07-22 (×12): 30 mL via ORAL
  Filled 2023-07-19 (×12): qty 30

## 2023-07-19 MED ORDER — SUCRALFATE 1 GM/10ML PO SUSP
1.0000 g | Freq: Three times a day (TID) | ORAL | Status: DC
  Administered 2023-07-19 – 2023-07-22 (×11): 1 g via ORAL
  Filled 2023-07-19 (×11): qty 10

## 2023-07-19 MED ORDER — SUCRALFATE 1 GM/10ML PO SUSP
1.0000 g | Freq: Three times a day (TID) | ORAL | Status: DC
  Administered 2023-07-19: 1 g via ORAL
  Filled 2023-07-19: qty 10

## 2023-07-19 MED ORDER — DILTIAZEM HCL ER COATED BEADS 120 MG PO CP24
120.0000 mg | ORAL_CAPSULE | Freq: Every day | ORAL | Status: DC
  Administered 2023-07-19 – 2023-07-22 (×4): 120 mg via ORAL
  Filled 2023-07-19 (×4): qty 1

## 2023-07-19 NOTE — Progress Notes (Signed)
[facility] Gastroenterology Progress Note  Sherri Gill 72 y.o. 09-22-1951  CC:   Gastric outlet obstruction   Subjective: Patient seen and examined at bedside.  She denies any significant abdominal pain but complaining of nausea and reflux.  Denies any vomiting.  ROS : afebrile, negative for chest pain   Objective: Vital signs in last 24 hours: Vitals:   07/19/23 0403 07/19/23 0910  BP: (!) 144/62 (!) 140/63  Pulse: 76 80  Resp: 18 18  Temp: 98.8 F (37.1 C) 98.5 F (36.9 C)  SpO2: 96% 96%    Physical Exam: Resting comfortably, not in acute distress, abdomen is mildly distended but otherwise soft.  Hypoactive bowel sound.  No peritoneal signs.   Lab Results: Recent Labs    07/18/23 0525 07/19/23 0525  NA 135 136  K 3.4* 3.6  CL 106 106  CO2 23 24  GLUCOSE 109* 165*  BUN 7* 8  CREATININE 0.70 0.73  CALCIUM 8.4* 8.5*   No results for input(s): "AST", "ALT", "ALKPHOS", "BILITOT", "PROT", "ALBUMIN" in the last 72 hours. Recent Labs    07/18/23 0525 07/19/23 0525  WBC 8.0 7.7  NEUTROABS 5.9 5.0  HGB 8.8* 8.2*  HCT 29.6* 27.1*  MCV 90.5 90.9  PLT 266 245   No results for input(s): "LABPROT", "INR" in the last 72 hours.    Assessment/Plan: -Possible distal small bowel obstruction based on upper GI series finding yesterday. -Nausea and vomiting.  Likely secondary to above -Recurrent uterine cancer with peritoneal carcinomatosis -Chronic anemia -History of paroxysmal atrial fibrillation.  On Xarelto  Recommendations -------------------------- -There was initial concern for gastric outflow obstruction.  She is status post push enteroscopy by Dr. [name] on July 16, 2023 which essentially ruled out gastric outlet obstruction.  Did showed thickened gastric folds.  Biopsies pending.  Upper GI series yesterday showed mildly dilated duodenum with sluggish contrast through the duodenum which raise the possibility of possible small bowel obstruction. -Repeat  abdominal x-ray today showed progression of the contrast to the rectum ruling out any possibility of obstruction.  She may have delayed emptying from likely peritoneal carcinomatosis -Add sucralfate for reflux.  Continue PPI. -No further inpatient GI workup planned.  GI will sign off.  Call us back if needed.   Kathi Der MD, FACP 07/19/2023, 9:32 AM  Contact #  [phone]

## 2023-07-19 NOTE — Plan of Care (Signed)
  Problem: Education: Goal: Knowledge of General Education information will improve Description: Including pain rating scale, medication(s)/side effects and non-pharmacologic comfort measures Outcome: Progressing   Problem: Health Behavior/Discharge Planning: Goal: Ability to manage health-related needs will improve Outcome: Progressing   Problem: Clinical Measurements: Goal: Ability to maintain clinical measurements within normal limits will improve Outcome: Progressing Goal: Will remain free from infection Outcome: Progressing Goal: Diagnostic test results will improve Outcome: Progressing Goal: Respiratory complications will improve Outcome: Progressing Goal: Cardiovascular complication will be avoided Outcome: Progressing   Problem: Activity: Goal: Risk for activity intolerance will decrease Outcome: Progressing   Problem: Nutrition: Goal: Adequate nutrition will be maintained Outcome: Progressing   Problem: Coping: Goal: Level of anxiety will decrease Outcome: Progressing   Problem: Elimination: Goal: Will not experience complications related to bowel motility Outcome: Progressing Goal: Will not experience complications related to urinary retention Outcome: Progressing   Problem: Safety: Goal: Ability to remain free from injury will improve Outcome: Progressing   Problem: Pain Managment: Goal: General experience of comfort will improve Outcome: Progressing   Problem: Skin Integrity: Goal: Risk for impaired skin integrity will decrease Outcome: Progressing   Problem: Education: Goal: Ability to describe self-care measures that may prevent or decrease complications (Diabetes Survival Skills Education) will improve Outcome: Progressing Goal: Individualized Educational Video(s) Outcome: Progressing   Problem: Fluid Volume: Goal: Ability to maintain a balanced intake and output will improve Outcome: Progressing   Problem: Coping: Goal: Ability to adjust  to condition or change in health will improve Outcome: Progressing   Problem: Metabolic: Goal: Ability to maintain appropriate glucose levels will improve Outcome: Progressing   Problem: Nutritional: Goal: Maintenance of adequate nutrition will improve Outcome: Progressing Goal: Progress toward achieving an optimal weight will improve Outcome: Progressing   Problem: Skin Integrity: Goal: Risk for impaired skin integrity will decrease Outcome: Progressing   Problem: Tissue Perfusion: Goal: Adequacy of tissue perfusion will improve Outcome: Progressing

## 2023-07-19 NOTE — Progress Notes (Signed)
PROGRESS NOTE  Sherri Gill  DOB: [birth year]  PCP: [name], MD ZOX:[id number]  DOA: 07/23/2023  LOS: 5 days  Hospital Day: 6  Brief narrative: Sherri Gill is a 72 y.o. female with PMH significant for advanced uterine cancer (s/p TAH, b/l SOO) with peritoneal carcinomatosis on active chemotherapy/immunotherapy, PAF on Xarelto, T2DM, HTN, HLD, migraine, anxiety, GERD 9/14, patient presented to the ED with complaint of persistent nausea, vomiting for several days, with poor oral intake, abdominal discomfort. CT A/P showed distention and wall thickening of stomach, proximal duodenum, some abnormal stranding along the remaining omentum and abdominal fluid collections along the small bowel mesentery and in the lesser sac.  This was raising concern for possible residual peritoneal tumor causing fluid deposits.  Patient was able to tolerate food and discharged to home.  Her symptoms continue to worsen at home and hence she returned back to ED on 9/16.  In the ED, patient was afebrile, tachycardic, hemodynamically stable Labs with WC count 10.6, hemoglobin 10.6, BUN/creatinine 24/1.28, lipase 47 Abdominal x-ray was obtained which was negative for acute pathology EDP discussed with oncology Dr. [name] and GI Dr. [name] NG tube was placed after which patient started to feel better Admitted to [facility]  Subjective: Patient was seen and examined this morning.   Propped up in bed.  Feels nauseous.  No vomiting.   Had a BM yesterday. KUB this morning shows progression of contrast to the rectum.  Assessment and plan: Partial small bowel obstruction Presented with intractable nausea and vomiting in the setting of peritoneal carcinomatosis  Suspect possible malignant stricture of proximal small bowel CT abdomen 9/14 as above showing stomach distention and raise suspicion of bowel obstruction. Decompressed with NG tube. 9/18, underwent EGD.  Normal-appearing, widely patent esophagus and  GEJ, edematous gastric fold which were biopsied.  Single duodenal polyp, biopsied.  No visible structural mass causing blockage.  NGT was removed.   9/20, upper GI series x-rays was done.  9/21, KUB showed contrast progression to rectum ruling out any possibly of obstruction. Patient is still nauseous.  She may probably have delayed gastric emptying from peritoneal carcinomatosis. GI has added sucralfate.  Continue scheduled Protonix and Mylanta.  Advanced uterine cancer  (s/p TAH, b/l SOO) with peritoneal carcinomatosis on active chemotherapy/immunotherapy Follows with oncology, Dr. [name].    Hypokalemia/hypomagnesemia Potassium level low at 3.4 today.  Replacement given. Recent Labs  Lab 07/15/23 0320 07/20/2023 0515 07/17/23 0632 07/18/23 0525 07/19/23 0525  K 3.1* 2.9* 3.2* 3.4* 3.6  MG  --  1.5*  --   --   --   PHOS  --  3.0  --   --   --    Acute kidney injury: Mild due to the poor oral intake and GI losses.  Creatinine improving with hydration. Recent Labs    07/04/23 0956 07/07/23 1049 07/10/23 1331 07/12/23 1249 07/28/2023 1334 07/15/23 0320 07/01/2023 0515 07/17/23 8119 07/18/23 0525 07/19/23 0525  BUN 13 23 20 19  24* 17 10 6* 7* 8  CREATININE 0.81 0.90 0.87 0.82 1.28* 0.97 0.67 0.68 0.70 0.73   Paroxysmal atrial fibrillation Remains in sinus rhythm with controlled rate.   PTA meds- Cardizem 120 mg daily, Xarelto. Both on hold currently.   Resume Cardizem today Since bowel obstruction has been ruled out and she has no potential need of surgery, I would resume Xarelto today.   Hypertension: PTA meds- losartan 100 mg daily, Cardizem 120 mg daily Currently oral meds on hold.  Blood pressure rising up.  Stop IV fluid Resume Cardizem today. IV hydralazine as needed  Type 2 diabetes mellitus A1c 6.4 on April 2024 PTA meds-metformin 1000 mg twice daily, currently on hold Continue SSI/Accu-Cheks.   Recent Labs  Lab 07/18/23 1640 07/18/23 2049 07/19/23 0053  07/19/23 0406 07/19/23 0800  GLUCAP 109* 106* 123* 112* 108*   Anemia chronic disease Vitamin B12 deficiency Hemoglobin at baseline close to 9.  No active bleeding.  Continue to monitor. Resume vitamin B12 supplement when able to. Recent Labs    03/21/23 0912 03/25/23 1541 07/15/23 0320 07/06/2023 0515 07/17/23 5366 07/18/23 0525 07/19/23 0525  HGB  --    < > 8.8* 8.7* 8.4* 8.8* 8.2*  MCV  --    < > 89.4 88.9 89.9 90.5 90.9  VITAMINB12 127*  --   --   --   --   --   --   FERRITIN 232  --   --   --   --   --   --   TIBC 260  --   --   --   --   --   --   IRON 42  --   --   --   --   --   --    < > = values in this interval not displayed.   Mobility: Independent at baseline.  Continue to encourage ambulation  Goals of care   Code Status: Full Code     DVT prophylaxis:  SCDs Start: 07/06/2023 2156 rivaroxaban (XARELTO) tablet 20 mg   Antimicrobials: None Fluid: Stop IV fluid Consultants: Oncology, GI Family Communication: None at bedside  Status: Inpatient Level of care:  Med-Surg   Patient is from: Home Needs to continue in-hospital care: Bowel obstruction resolving.  Still feels very nauseous Anticipated d/c to: Pending clinical course      Diet:  Diet Order             Diet full liquid Room service appropriate? Yes; Fluid consistency: Thin  Diet effective now                   Scheduled Meds:  alum & mag hydroxide-simeth  30 mL Oral Q6H   bisacodyl  10 mg Rectal Once   Chlorhexidine Gluconate Cloth  6 each Topical Daily   dorzolamide-timolol  1 drop Both Eyes BID   feeding supplement  1 Container Oral BID BM   insulin aspart  0-9 Units Subcutaneous Q4H   latanoprost  1 drop Both Eyes QHS   pantoprazole (PROTONIX) IV  40 mg Intravenous BID AC   rivaroxaban  20 mg Oral Daily   sucralfate  1 g Oral TID AC    PRN meds: acetaminophen, hydrALAZINE, HYDROmorphone (DILAUDID) injection, metoprolol tartrate, ondansetron (ZOFRAN) IV   Infusions:    dextrose 5 % and 0.9 % NaCl 75 mL/hr at 07/19/23 1000   famotidine (PEPCID) IV Stopped (07/18/23 2134)   lactated ringers Stopped (07/03/2023 1502)    Antimicrobials: Anti-infectives (From admission, onward)    None       Objective: Vitals:   07/19/23 0403 07/19/23 0910  BP: (!) 144/62 (!) 140/63  Pulse: 76 80  Resp: 18 18  Temp: 98.8 F (37.1 C) 98.5 F (36.9 C)  SpO2: 96% 96%    Intake/Output Summary (Last 24 hours) at 07/19/2023 1109 Last data filed at 07/19/2023 1000 Gross per 24 hour  Intake 1347.27 ml  Output --  Net 1347.27 ml  Filed Weights   06/29/2023 0410 07/27/2023 1300  Weight: 80.9 kg 80.9 kg   Weight change:  Body mass index is 31.59 kg/m.   Physical Exam: General exam: Pleasant elderly African-American female.  In distress because of intense nausea Skin: No rashes, lesions or ulcers. HEENT: Atraumatic, normocephalic, no obvious bleeding Lungs: Clear to auscultate bilaterally CVS: Regular rate and rhythm, no murmur GI/Abd soft, feels distended nauseous.  Tenderness improving.  Bowel sound present. CNS: Alert, awake, oriented x 3 Psychiatry: Mood appropriate Extremities: No edema, no calf tenderness  Data Review: I have personally reviewed the laboratory data and studies available.  F/u labs ordered Unresulted Labs (From admission, onward)    None       Total time spent in review of labs and imaging, patient evaluation, formulation of plan, documentation and communication with family: 45 minutes  Signed, Lorin Glass, MD [facility] 07/19/2023

## 2023-07-19 NOTE — Progress Notes (Signed)
Mobility Specialist - Progress Note   07/19/23 1430  Mobility  Activity Ambulated independently in hallway  Level of Assistance Independent  Assistive Device None  Distance Ambulated (ft) 500 ft  Activity Response Tolerated well  Mobility Referral Yes  $Mobility charge 1 Mobility  Mobility Specialist Start Time (ACUTE ONLY) 0221  Mobility Specialist Stop Time (ACUTE ONLY) 0229  Mobility Specialist Time Calculation (min) (ACUTE ONLY) 8 min   Pt received in recliner and agreeable to mobility. No complaints during session. Pt to recliner after session with all needs met.    [facility]

## 2023-07-19 NOTE — Progress Notes (Signed)
Chaplain visited patient at nurse's request while rounding on unit. Nurse shared that patient has asked whether she is dying, and nurse attempted to reassure pt but thought chaplain visit would be helpful. Sherri Gill spoke openly about her health crisis and how it has impacted her emotionally. Her close friend was present as well. Sherri Gill received comfort from prayer she requested, reflective listening and ministry of presence.  Chaplain [place], [place]   07/19/23 1700  Spiritual Encounters  Type of Visit Follow up  Care provided to: Patient;Friend  Conversation partners present during encounter Nurse  Referral source Nurse (RN/NT/LPN)  Reason for visit Routine spiritual support  Spiritual Framework  Presenting Themes Values and beliefs;Impactful experiences and emotions  Community/Connection Family;Friend(s);Faith community  Patient Stress Factors Health changes  Interventions  Spiritual Care Interventions Made Established relationship of care and support;Compassionate presence;Reflective listening;Normalization of emotions;Prayer  Intervention Outcomes  Outcomes Reduced anxiety;Connection to values and goals of care

## 2023-07-20 DIAGNOSIS — K311 Adult hypertrophic pyloric stenosis: Secondary | ICD-10-CM | POA: Diagnosis not present

## 2023-07-20 LAB — GLUCOSE, CAPILLARY
Glucose-Capillary: 100 mg/dL — ABNORMAL HIGH (ref 70–99)
Glucose-Capillary: 104 mg/dL — ABNORMAL HIGH (ref 70–99)
Glucose-Capillary: 107 mg/dL — ABNORMAL HIGH (ref 70–99)
Glucose-Capillary: 114 mg/dL — ABNORMAL HIGH (ref 70–99)
Glucose-Capillary: 119 mg/dL — ABNORMAL HIGH (ref 70–99)
Glucose-Capillary: 135 mg/dL — ABNORMAL HIGH (ref 70–99)

## 2023-07-20 MED ORDER — METOCLOPRAMIDE HCL 5 MG/ML IJ SOLN
10.0000 mg | Freq: Three times a day (TID) | INTRAMUSCULAR | Status: AC
  Administered 2023-07-20 – 2023-07-21 (×2): 10 mg via INTRAVENOUS
  Filled 2023-07-20 (×2): qty 2

## 2023-07-20 NOTE — Plan of Care (Signed)

## 2023-07-20 NOTE — Progress Notes (Signed)
PROGRESS NOTE  Sherri Gill  DOB: November 27, 1950  PCP: [name], MD WUJ:[id number]  DOA: 06/30/2023  LOS: 6 days  Hospital Day: 7  Brief narrative: Sherri Gill is a 72 y.o. female with PMH significant for advanced uterine cancer (s/p TAH, b/l SOO) with peritoneal carcinomatosis on active chemotherapy/immunotherapy, PAF on Xarelto, T2DM, HTN, HLD, migraine, anxiety, GERD 9/14, patient presented to the ED with complaint of persistent nausea, vomiting for several days, with poor oral intake, abdominal discomfort. CT A/P showed distention and wall thickening of stomach, proximal duodenum, some abnormal stranding along the remaining omentum and abdominal fluid collections along the small bowel mesentery and in the lesser sac.  This was raising concern for possible residual peritoneal tumor causing fluid deposits.  Patient was able to tolerate food and discharged to home.  Her symptoms continue to worsen at home and hence she returned back to ED on 9/16.  In the ED, patient was afebrile, tachycardic, hemodynamically stable Labs with WC count 10.6, hemoglobin 10.6, BUN/creatinine 24/1.28, lipase 47 Abdominal x-ray was obtained which was negative for acute pathology EDP discussed with oncology Dr. [name] and GI Dr. [name] NG tube was placed after which patient started to feel better Admitted to [facility]  Subjective: Patient was seen and examined this morning.   Propped up in bed.  States she does not feel any different than how she first came in in the ER.   Continues to feel nauseous and feels abdominal fullness. States she is burping a lot.  Last BM yesterday.  Assessment and plan: Partial small bowel obstruction Presented with intractable nausea and vomiting in the setting of peritoneal carcinomatosis  Suspect possible malignant stricture of proximal small bowel CT abdomen 9/14 as above showing stomach distention and raise suspicion of bowel obstruction. Decompressed with NG  tube. 2023-08-13, underwent EGD.  Normal-appearing, widely patent esophagus and GEJ, edematous gastric fold which were biopsied.  Single duodenal polyp, biopsied.  No visible structural mass causing blockage.  NGT was removed.   9/20, upper GI series x-rays was done.  9/21, KUB showed contrast progression to rectum ruling out any possibly of obstruction. Currently on IV Protonix, scheduled sucralfate and scheduled Mylanta. Despite these meds, patient continues to feel nauseous gassy and abdominal fullness.  She may probably have delayed gastric emptying from peritoneal carcinomatosis. I added 3 doses of IV Reglan scheduled.  Continue to monitor  Advanced uterine cancer  (s/p TAH, b/l SOO) with peritoneal carcinomatosis on active chemotherapy/immunotherapy Follows with oncology, Dr. [name].    Hypokalemia/hypomagnesemia Potassium level to be rechecked tomorrow Recent Labs  Lab 07/15/23 0320 August 13, 2023 0515 07/17/23 9562 07/18/23 0525 07/19/23 0525  K 3.1* 2.9* 3.2* 3.4* 3.6  MG  --  1.5*  --   --   --   PHOS  --  3.0  --   --   --    Acute kidney injury: Mild due to the poor oral intake and GI losses.  Creatinine improving with hydration. Recent Labs    07/04/23 0956 07/07/23 1049 07/10/23 1331 07/12/23 1249 07/09/2023 1334 07/15/23 0320 08/13/23 0515 07/17/23 1308 07/18/23 0525 07/19/23 0525  BUN 13 23 20 19  24* 17 10 6* 7* 8  CREATININE 0.81 0.90 0.87 0.82 1.28* 0.97 0.67 0.68 0.70 0.73   Paroxysmal atrial fibrillation Remains in sinus rhythm with controlled rate.   PTA meds- Cardizem 120 mg daily, Xarelto. Both on hold currently.   Resume Cardizem today Since bowel obstruction has been ruled out and  she has no potential need of surgery, I would resume Xarelto today.   Hypertension: PTA meds- losartan 100 mg daily, Cardizem 120 mg daily Currently on Cardizem.  Losartan on hold.  Blood pressure mostly under 150 Continue IV hydralazine IV hydralazine as needed  Type 2  diabetes mellitus Hypoglycemia A1c 6.4 on April 2024 PTA meds-metformin 1000 mg twice daily, currently on hold Because of poor appetite, blood sugar level is running low and hence she has been continued on D5 NS drip at 50 mL/h. Continue SSI/Accu-Cheks Recent Labs  Lab 07/19/23 1619 07/19/23 2012 07/20/23 0010 07/20/23 0432 07/20/23 0733  GLUCAP 88 110* 104* 107* 114*   Anemia chronic disease Vitamin B12 deficiency Hemoglobin at baseline close to 9.  No active bleeding.  Continue to monitor. Resume vitamin B12 supplement when able to. Recent Labs    03/21/23 0912 03/25/23 1541 07/15/23 0320 2023-08-09 0515 07/17/23 3086 07/18/23 0525 07/19/23 0525  HGB  --    < > 8.8* 8.7* 8.4* 8.8* 8.2*  MCV  --    < > 89.4 88.9 89.9 90.5 90.9  VITAMINB12 127*  --   --   --   --   --   --   FERRITIN 232  --   --   --   --   --   --   TIBC 260  --   --   --   --   --   --   IRON 42  --   --   --   --   --   --    < > = values in this interval not displayed.   Mobility: Independent at baseline.  Continue to encourage ambulation  Goals of care   Code Status: Full Code     DVT prophylaxis:  SCDs Start: 07/03/2023 2156 rivaroxaban (XARELTO) tablet 20 mg   Antimicrobials: None Fluid: D5 NS at 50 mL/h Consultants: Oncology, GI Family Communication: None at bedside  Status: Inpatient Level of care:  Med-Surg   Patient is from: Home Needs to continue in-hospital care: Continues to feel nauseous, uncomfortable and gassy Anticipated d/c to: Pending clinical course      Diet:  Diet Order             Diet full liquid Room service appropriate? Yes; Fluid consistency: Thin  Diet effective now                   Scheduled Meds:  alum & mag hydroxide-simeth  30 mL Oral Q6H   bisacodyl  10 mg Rectal Once   Chlorhexidine Gluconate Cloth  6 each Topical Daily   diltiazem  120 mg Oral Daily   dorzolamide-timolol  1 drop Both Eyes BID   feeding supplement  1 Container Oral BID  BM   insulin aspart  0-9 Units Subcutaneous Q4H   latanoprost  1 drop Both Eyes QHS   pantoprazole (PROTONIX) IV  40 mg Intravenous BID AC   rivaroxaban  20 mg Oral Daily   sucralfate  1 g Oral TID WC & HS    PRN meds: acetaminophen, hydrALAZINE, HYDROmorphone (DILAUDID) injection, metoprolol tartrate, ondansetron (ZOFRAN) IV   Infusions:   dextrose 5 % and 0.9 % NaCl 50 mL/hr at 07/19/23 1700   famotidine (PEPCID) IV 20 mg (07/19/23 2228)   lactated ringers Stopped (2023-08-09 1502)    Antimicrobials: Anti-infectives (From admission, onward)    None       Objective: Vitals:   07/19/23  2014 07/20/23 0436  BP: (!) 152/69 (!) 139/57  Pulse: 76 74  Resp: 18 18  Temp: 99 F (37.2 C)   SpO2: 95% 97%    Intake/Output Summary (Last 24 hours) at 07/20/2023 1153 Last data filed at 07/20/2023 0600 Gross per 24 hour  Intake 683.52 ml  Output --  Net 683.52 ml    Filed Weights   07/18/2023 0410 07/28/2023 1300  Weight: 80.9 kg 80.9 kg   Weight change:  Body mass index is 31.59 kg/m.   Physical Exam: General exam: Pleasant elderly African-American female.  In distress because of intense nausea Skin: No rashes, lesions or ulcers. HEENT: Atraumatic, normocephalic, no obvious bleeding Lungs: Clear to auscultate bilaterally CVS: Regular rate and rhythm, no murmur GI/Abd soft, feels distended nauseous.  Tenderness mild.  Bowel sound sluggish. CNS: Alert, awake, oriented x 3 Psychiatry: Mood appropriate Extremities: No edema, no calf tenderness  Data Review: I have personally reviewed the laboratory data and studies available.  F/u labs ordered Unresulted Labs (From admission, onward)     Start     Ordered   07/21/23 0500  Basic metabolic panel  Tomorrow morning,   R       Question:  Specimen collection method  Answer:  Unit=Unit collect   07/20/23 0927   07/21/23 0500  CBC with Differential/Platelet  Tomorrow morning,   R       Question:  Specimen collection method   Answer:  Unit=Unit collect   07/20/23 0927            Total time spent in review of labs and imaging, patient evaluation, formulation of plan, documentation and communication with family: 45 minutes  Signed, Lorin Glass, MD [facility] 07/20/2023

## 2023-07-21 ENCOUNTER — Encounter: Payer: Self-pay | Admitting: Hematology and Oncology

## 2023-07-21 DIAGNOSIS — K311 Adult hypertrophic pyloric stenosis: Secondary | ICD-10-CM | POA: Diagnosis not present

## 2023-07-21 LAB — BASIC METABOLIC PANEL WITH GFR
Anion gap: 7 (ref 5–15)
BUN: 9 mg/dL (ref 8–23)
CO2: 25 mmol/L (ref 22–32)
Calcium: 8.7 mg/dL — ABNORMAL LOW (ref 8.9–10.3)
Chloride: 104 mmol/L (ref 98–111)
Creatinine, Ser: 0.71 mg/dL (ref 0.44–1.00)
GFR, Estimated: >60 mL/min
Glucose, Bld: 118 mg/dL — ABNORMAL HIGH (ref 70–99)
Potassium: 3.2 mmol/L — ABNORMAL LOW (ref 3.5–5.1)
Sodium: 136 mmol/L (ref 135–145)

## 2023-07-21 LAB — CBC WITH DIFFERENTIAL/PLATELET
Abs Immature Granulocytes: 0.03 K/uL (ref 0.00–0.07)
Basophils Absolute: 0 K/uL (ref 0.0–0.1)
Basophils Relative: 0 %
Eosinophils Absolute: 0.1 K/uL (ref 0.0–0.5)
Eosinophils Relative: 1 %
HCT: 27.7 % — ABNORMAL LOW (ref 36.0–46.0)
Hemoglobin: 8.6 g/dL — ABNORMAL LOW (ref 12.0–15.0)
Immature Granulocytes: 0 %
Lymphocytes Relative: 17 %
Lymphs Abs: 1.4 K/uL (ref 0.7–4.0)
MCH: 27.7 pg (ref 26.0–34.0)
MCHC: 31 g/dL (ref 30.0–36.0)
MCV: 89.1 fL (ref 80.0–100.0)
Monocytes Absolute: 1.1 K/uL — ABNORMAL HIGH (ref 0.1–1.0)
Monocytes Relative: 13 %
Neutro Abs: 5.4 K/uL (ref 1.7–7.7)
Neutrophils Relative %: 69 %
Platelets: 271 K/uL (ref 150–400)
RBC: 3.11 MIL/uL — ABNORMAL LOW (ref 3.87–5.11)
RDW: 15.8 % — ABNORMAL HIGH (ref 11.5–15.5)
WBC: 7.9 K/uL (ref 4.0–10.5)
nRBC: 0 % (ref 0.0–0.2)

## 2023-07-21 LAB — GLUCOSE, CAPILLARY
Glucose-Capillary: 104 mg/dL — ABNORMAL HIGH (ref 70–99)
Glucose-Capillary: 109 mg/dL — ABNORMAL HIGH (ref 70–99)
Glucose-Capillary: 127 mg/dL — ABNORMAL HIGH (ref 70–99)
Glucose-Capillary: 142 mg/dL — ABNORMAL HIGH (ref 70–99)
Glucose-Capillary: 75 mg/dL (ref 70–99)
Glucose-Capillary: 95 mg/dL (ref 70–99)

## 2023-07-21 MED ORDER — METOCLOPRAMIDE HCL 5 MG/ML IJ SOLN
10.0000 mg | Freq: Three times a day (TID) | INTRAMUSCULAR | Status: AC
  Administered 2023-07-21 – 2023-07-24 (×7): 10 mg via INTRAVENOUS
  Filled 2023-07-21 (×7): qty 2

## 2023-07-21 MED ORDER — POTASSIUM CHLORIDE CRYS ER 20 MEQ PO TBCR
40.0000 meq | EXTENDED_RELEASE_TABLET | Freq: Once | ORAL | Status: AC
  Administered 2023-07-21: 40 meq via ORAL
  Filled 2023-07-21: qty 2

## 2023-07-21 MED ORDER — GLUCERNA SHAKE PO LIQD
237.0000 mL | Freq: Two times a day (BID) | ORAL | Status: DC
  Administered 2023-07-21: 237 mL via ORAL
  Filled 2023-07-21 (×3): qty 237

## 2023-07-21 MED ORDER — DEXAMETHASONE 4 MG PO TABS: 8.0000 mg | ORAL_TABLET | ORAL | Status: DC

## 2023-07-21 NOTE — Progress Notes (Signed)
PROGRESS NOTE  Sherri Gill  DOB: 1951/10/24  PCP: [name], MD ZOX:[id number]  DOA: 07/10/2023  LOS: 7 days  Hospital Day: 8  Brief narrative: Sherri Gill is a 72 y.o. female with PMH significant for advanced uterine cancer (s/p TAH, b/l SOO) with peritoneal carcinomatosis on active chemotherapy/immunotherapy, PAF on Xarelto, T2DM, HTN, HLD, migraine, anxiety, GERD 9/14, patient presented to the ED with complaint of persistent nausea, vomiting for several days, with poor oral intake, abdominal discomfort. CT A/P showed distention and wall thickening of stomach, proximal duodenum, some abnormal stranding along the remaining omentum and abdominal fluid collections along the small bowel mesentery and in the lesser sac.  This was raising concern for possible residual peritoneal tumor causing fluid deposits.  Patient was able to tolerate food and discharged to home.  Her symptoms continue to worsen at home and hence she returned back to ED on 9/16.  In the ED, patient was afebrile, tachycardic, hemodynamically stable Labs with WC count 10.6, hemoglobin 10.6, BUN/creatinine 24/1.28, lipase 47 Abdominal x-ray was obtained which was negative for acute pathology EDP discussed with oncology Dr. [name] and GI Dr. [name] NG tube was placed after which patient started to feel better Admitted to [facility]  Subjective: Patient was seen and examined this morning.   Continues to have nausea.  No vomiting.  Passing small bowel movements and gas. Wants to try soft food today. Oncology follow-up from this morning apprreciated.  Assessment and plan: Partial small bowel obstruction Presented with intractable nausea and vomiting in the setting of peritoneal carcinomatosis  Suspect possible malignant stricture of proximal small bowel CT abdomen 9/14 as above showing stomach distention and raise suspicion of bowel obstruction. Decompressed with NG tube. 2023/07/24, underwent EGD.  Normal-appearing,  widely patent esophagus and GEJ, edematous gastric fold which were biopsied.  Single duodenal polyp, biopsied.  No visible structural mass causing blockage.  NGT was removed.   9/20, upper GI series x-rays was done.  9/21, KUB showed contrast progression to rectum ruling out any possibly of obstruction. Currently on IV Protonix, scheduled sucralfate and scheduled Mylanta. Despite these meds, patient continues to feel nauseous gassy and abdominal fullness.  She may probably have delayed gastric emptying from peritoneal carcinomatosis. 9/22, I started her on IV Reglan every 8 hours scheduled.  Feel somewhat better with that.  I would continue same plan for today.  EKG 9/14 with QTc 490 ms. Obtain repeat EKG for QTc monitoring.  Advanced uterine cancer  (s/p TAH, b/l SOO) with peritoneal carcinomatosis on active chemotherapy/immunotherapy Follows with oncology, Dr. [name].   Noted a plan to start chemotherapy inpatient starting 9/25  Hypokalemia/hypomagnesemia Potassium level low today.  Replacement given. Recent Labs  Lab 24-Jul-2023 0515 07/17/23 8119 07/18/23 0525 07/19/23 0525 07/21/23 0500  K 2.9* 3.2* 3.4* 3.6 3.2*  MG 1.5*  --   --   --   --   PHOS 3.0  --   --   --   --    Acute kidney injury: Mild due to the poor oral intake and GI losses.  Creatinine improving with hydration. Recent Labs    07/07/23 1049 07/10/23 1331 07/12/23 1249 07/02/2023 1334 07/15/23 0320 July 24, 2023 0515 07/17/23 1478 07/18/23 0525 07/19/23 0525 07/21/23 0500  BUN 23 20 19  24* 17 10 6* 7* 8 9  CREATININE 0.90 0.87 0.82 1.28* 0.97 0.67 0.68 0.70 0.73 0.71   Paroxysmal atrial fibrillation Remains in sinus rhythm with controlled rate.   PTA meds- Cardizem  120 mg daily, Xarelto. Both have been resumed.   Hypertension: PTA meds- losartan 100 mg daily, Cardizem 120 mg daily Currently on Cardizem.  Losartan remains on hold.  Blood pressure mostly less than 150.   Continue IV hydralazine  Type 2  diabetes mellitus Hypoglycemia A1c 6.4 on April 2024 PTA meds-metformin 1000 mg twice daily, currently on hold Because of poor appetite, blood sugar level is running low and hence she has been continued on D5 NS drip at 50 mL/h. Continue SSI/Accu-Cheks Recent Labs  Lab 07/20/23 1944 07/21/23 0019 07/21/23 0446 07/21/23 0738 07/21/23 1241  GLUCAP 119* 127* 109* 104* 95   Anemia chronic disease Vitamin B12 deficiency Hemoglobin at baseline close to 9.  No active bleeding.  Continue to monitor. Resume vitamin B12 supplement when able to. Recent Labs    03/21/23 0912 03/25/23 1541 07/03/2023 0515 07/17/23 4098 07/18/23 0525 07/19/23 0525 07/21/23 0500  HGB  --    < > 8.7* 8.4* 8.8* 8.2* 8.6*  MCV  --    < > 88.9 89.9 90.5 90.9 89.1  VITAMINB12 127*  --   --   --   --   --   --   FERRITIN 232  --   --   --   --   --   --   TIBC 260  --   --   --   --   --   --   IRON 42  --   --   --   --   --   --    < > = values in this interval not displayed.   Bipedal edema She is starting to get puffy legs.  I plan to stop IV fluid but patient is concerned of dehydration and low glucose level and hence wants to continue it.  Continue to monitor edema.  Obtain albumin level  Mobility: Independent at baseline.  Continue to encourage ambulation  Goals of care   Code Status: Full Code     DVT prophylaxis:  SCDs Start: 07/08/2023 2156 rivaroxaban (XARELTO) tablet 20 mg   Antimicrobials: None Fluid: D5 NS at 50 mL/h Consultants: Oncology, GI Family Communication: None at bedside  Status: Inpatient Level of care:  Med-Surg   Patient is from: Home Needs to continue in-hospital care: Continues to feel nauseous, uncomfortable and gassy.  Noted plan from oncology to initiate chemotherapy inpatient on 9/25 Anticipated d/c to: Pending clinical course    Diet:  Diet Order             DIET SOFT Room service appropriate? Yes; Fluid consistency: Thin  Diet effective now                    Scheduled Meds:  alum & mag hydroxide-simeth  30 mL Oral Q6H   Chlorhexidine Gluconate Cloth  6 each Topical Daily   [START ON 07/22/2023] dexamethasone  8 mg Oral UD   diltiazem  120 mg Oral Daily   dorzolamide-timolol  1 drop Both Eyes BID   feeding supplement  1 Container Oral BID BM   insulin aspart  0-9 Units Subcutaneous Q4H   latanoprost  1 drop Both Eyes QHS   metoCLOPramide (REGLAN) injection  10 mg Intravenous Q8H   metoCLOPramide (REGLAN) injection  10 mg Intravenous Q8H   pantoprazole (PROTONIX) IV  40 mg Intravenous BID AC   rivaroxaban  20 mg Oral Daily   sucralfate  1 g Oral TID WC & HS  PRN meds: acetaminophen, hydrALAZINE, HYDROmorphone (DILAUDID) injection, metoprolol tartrate, ondansetron (ZOFRAN) IV   Infusions:   dextrose 5 % and 0.9 % NaCl 50 mL/hr at 07/21/23 1128   famotidine (PEPCID) IV Stopped (07/20/23 2229)   lactated ringers Stopped (06/29/2023 1502)    Antimicrobials: Anti-infectives (From admission, onward)    None       Objective: Vitals:   07/20/23 2200 07/21/23 1003  BP: 138/62 (!) 141/62  Pulse: 75 66  Resp: 17 20  Temp: 98.7 F (37.1 C) 98.4 F (36.9 C)  SpO2: 94% 100%    Intake/Output Summary (Last 24 hours) at 07/21/2023 1337 Last data filed at 07/21/2023 0630 Gross per 24 hour  Intake 1191.25 ml  Output --  Net 1191.25 ml    Filed Weights   07/22/2023 0410 07/18/2023 1300  Weight: 80.9 kg 80.9 kg   Weight change:  Body mass index is 31.59 kg/m.   Physical Exam: General exam: Pleasant elderly African-American female.  Remains in distress from nausea, abdominal distention Skin: No rashes, lesions or ulcers. HEENT: Atraumatic, normocephalic, no obvious bleeding Lungs: Clear to auscultate bilaterally CVS: Regular rate and rhythm, no murmur GI/Abd soft, feels distended nauseous.  Tenderness mild.  Bowel sound . CNS: Alert, awake, oriented x 3 Psychiatry: Mood appropriate Extremities: No edema, no calf  tenderness  Data Review: I have personally reviewed the laboratory data and studies available.  F/u labs ordered Unresulted Labs (From admission, onward)     Start     Ordered   07/22/23 0500  Comprehensive metabolic panel  Tomorrow morning,   R       Question:  Specimen collection method  Answer:  Unit=Unit collect   07/21/23 0806   07/21/23 1337  Hepatic function panel  Add-on,   AD       Question:  Specimen collection method  Answer:  Unit=Unit collect   07/21/23 1336            Total time spent in review of labs and imaging, patient evaluation, formulation of plan, documentation and communication with family: 45 minutes  Signed, Lorin Glass, MD [facility] 07/21/2023

## 2023-07-21 NOTE — Progress Notes (Signed)
Chaplain engaged in a follow-up visit with Sherri Gill. Somone voiced not wanting to be consumed by fear during this time in her life. She stated that she doesn't want to live out her time afraid or consumed by what she cannot change essentially. Chaplain affirmed her emotions and how hard it is to be in the unknown of diagnosis and health. Chaplain was able to provide compassionate and supportive presence and pray with Sherri Gill. Chaplain could assess that Sherri Gill may be feeling confused, anxious or overwhelmed today and in need of some assurance.     07/21/23 1100  Spiritual Encounters  Type of Visit Follow up  Care provided to: Patient  Reason for visit Routine spiritual support

## 2023-07-21 NOTE — Progress Notes (Signed)
Sherri Gill   DOB:[birth year]   ZD#:[id number]    ASSESSMENT & PLAN:  Recurrent uterine cancer She presents with partial small bowel obstruction Appreciate GI consult Already feeling better with NG tube decompression and IV fluids I reviewed the operative note from her EGD and biopsy results Biopsy is taken but no signs of malignant obstruction, negative for malignancy Further imaging studies show no definitive signs of obstruction but possibly interference with peristalsis from peritoneal disease At current state, she is not able to maintain nutritional state as she has chronic nausea vomiting  We discussed risk, benefits, side effects of chemotherapy I recommend resumption of chemotherapy with carboplatin and paclitaxel which she tolerated well in the past I will make arrangement for her to resume chemotherapy possibly on Wednesday this week In the meantime, continue supportive care   Nausea, vomiting and dehydration She is feeling better with nothing by mouth, IV fluids and antiemetics as needed NG tube was removed She will continue antiemetics as needed and slowly advance diet with clear liquid diet There is a component of GERD Recommend adding Pepcid in the evening Appreciate GI consult for further evaluation with different imaging She will continue liquid diet as tolerated along with Reglan and other medications   Anemia chronic disease She is not symptomatic Observe only   Dehydration, mild hypokalemia and other electrolyte imbalance Likely due to poor oral intake Replace as needed   Discharge planning Unlikely to be discharged until end of this week, pending response to treatment  All questions were answered. The patient knows to call the clinic with any problems, questions or concerns.   The total time spent in the appointment was 55 minutes encounter with patients including review of chart and various tests results, discussions about plan of care and coordination of  care plan  Artis Delay, MD 07/21/2023 7:59 AM  Subjective:  Events over the weekend is reviewed.  She continues to have difficulties tolerating oral diet and postprandial nausea but no vomiting.  She denies constipation I reviewed all her imaging studies so far  Objective:  Vitals:   07/20/23 1440 07/20/23 2200  BP: (!) 145/65 138/62  Pulse: 72 75  Resp: 16 17  Temp: 99.3 F (37.4 C) 98.7 F (37.1 C)  SpO2: 99% 94%     Intake/Output Summary (Last 24 hours) at 07/21/2023 0759 Last data filed at 07/20/2023 1736 Gross per 24 hour  Intake 527.37 ml  Output 150 ml  Net 377.37 ml    GENERAL:alert, no distress and comfortable  NEURO: alert & oriented x 3 with fluent speech, no focal motor/sensory deficits   Labs:  Recent Labs    07/10/23 1331 07/10/23 1331 07/12/23 1249 August 06, 2023 1334 07/15/23 0320 07/18/23 0525 07/19/23 0525 07/21/23 0500  NA 134*  --  134* 135   < > 135 136 136  K 3.8  --  2.8* 3.6   < > 3.4* 3.6 3.2*  CL 96*  --  96* 97*   < > 106 106 104  CO2 28  --  27 28   < > 23 24 25   GLUCOSE 105*  --  110* 104*   < > 109* 165* 118*  BUN 20  --  19 24*   < > 7* 8 9  CREATININE 0.87  --  0.82 1.28*   < > 0.70 0.73 0.71  CALCIUM 10.1  --  9.6 9.8   < > 8.4* 8.5* 8.7*  GFRNONAA >60   < > >60  45*   < > >60 >60 >60  PROT 7.6  --  7.1 7.3  --   --   --   --   ALBUMIN 4.3  --  3.9 4.2  --   --   --   --   AST 12*  --  12* 10*  --   --   --   --   ALT 12  --  13 8  --   --   --   --   ALKPHOS 128*  --  108 117  --   --   --   --   BILITOT 0.3  --  0.9 0.3  --   --   --   --    < > = values in this interval not displayed.    Studies: Imaging studies reviewed DG Abd 1 View  Result Date: 07/19/2023 CLINICAL DATA:  [id number] SBO (small bowel obstruction) (HCC) [id number] EXAM: ABDOMEN - 1 VIEW COMPARISON:  July 18, 2023 FINDINGS: Enteric contrast has progressed to the rectum. Multiple colonic diverticuli. No dilated loops of bowel are seen. IMPRESSION: Enteric contrast  has progressed to the rectum. Electronically Signed   By: Meda Klinefelter M.D.   On: 07/19/2023 10:22   DG UGI W SINGLE CM (SOL OR THIN BA)  Result Date: 07/18/2023 CLINICAL DATA:  Endometrial/uterine cancer. Fold thickening in the stomach on recent endoscopy. Reflux and bloating. Difficulty keeping foods and liquids down. EXAM: UPPER GI SERIES WITH KUB TECHNIQUE: After obtaining a scout radiograph a routine upper GI series was performed using thin barium. Mobility was mildly limited on today's exam, for example the patient was unable to turn prone. FLUOROSCOPY: Radiation Exposure Index (as provided by the fluoroscopic device): 21.7 mGy Kerma COMPARISON:  CT abdomen 07/12/2023 FINDINGS: Initial KUB unremarkable aside from mild levoconvex lumbar scoliosis. The pharyngeal phase of swallowing appears normal. Primary peristaltic waves were intact on 4/4 swallows. Small type 1 hiatal hernia. Occasional gastroesophageal reflux was observed on today's exam. I used mucosal relief and balloon compression of the stomach in order to help mitigate the use of thin barium and single contrast technique as well as the patient is inability to perform prone imaging. These demonstrate a gastric fold thickening as shown on prior CT and prior endoscopy. The duodenum is mildly dilated, especially proximally. Eventually this proceeded on to the fourth portion of the duodenum as shown on image 1 series 15. However, the contrast column was sluggish to flow through the duodenum, and given the somewhat dilated appearance, the possibility of obstruction distally is not excluded. Consider following the barium column with serial KUB use in assessing for small bowel obstruction in several hours. A 13 mm barium tablet passed briskly into the stomach. Port-A-Cath noted. IMPRESSION: 1. Gastric fold thickening as shown on prior CT and endoscopy. 2. Small type 1 hiatal hernia. 3. Intermittent gastroesophageal reflux. 4. Mildly dilated duodenum,  with sluggish flow of contrast through the duodenum. The possibility of a more distal small bowel obstruction is not excluded given this appearance. Consider KUB follow-up in several hours to assess for transit of the barium column through the small bowel in assessing for small bowel obstruction. Electronically Signed   By: Gaylyn Rong M.D.   On: 07/18/2023 12:00   DG Abd Portable 1 View  Result Date: 2023/07/28 CLINICAL DATA:  Nasogastric tube placement. EXAM: PORTABLE ABDOMEN - 1 VIEW COMPARISON:  Abdomen and pelvis CT dated 07/12/2023. FINDINGS: Normal bowel-gas pattern. Interval nasogastric  tube with its tip and side hole in the proximal to mid stomach. Lower thoracic spine degenerative changes. IMPRESSION: Nasogastric tube tip and side hole in the proximal to mid stomach. Electronically Signed   By: Beckie Salts M.D.   On: 07/20/2023 17:09   DG Abdomen Acute W/Chest  Result Date: 07/13/2023 CLINICAL DATA:  Vomiting EXAM: DG ABDOMEN ACUTE WITH 1 VIEW CHEST COMPARISON:  07/10/2023. FINDINGS: There is no evidence of dilated bowel loops or free intraperitoneal air. No radiopaque calculi or other significant radiographic abnormality is seen. Heart size and mediastinal contours are within normal limits. Both lungs are clear. There are thoracic degenerative changes. Right-sided Port-A-Cath tip overlies distal SVC. IMPRESSION: Negative abdominal radiographs.  No acute cardiopulmonary disease. Electronically Signed   By: Layla Maw M.D.   On: 07/07/2023 14:40   CT ABDOMEN PELVIS W CONTRAST  Result Date: 07/12/2023 CLINICAL DATA:  Endometrial/uterine cancer.  Nausea and vomiting. * Tracking Code: BO * EXAM: CT ABDOMEN AND PELVIS WITH CONTRAST TECHNIQUE: Multidetector CT imaging of the abdomen and pelvis was performed using the standard protocol following bolus administration of intravenous contrast. RADIATION DOSE REDUCTION: This exam was performed according to the departmental dose-optimization  program which includes automated exposure control, adjustment of the mA and/or kV according to patient size and/or use of iterative reconstruction technique. CONTRAST:  OMNIPAQUE IOHEXOL 300 MG/ML  SOLN COMPARISON:  CT abdomen/pelvis from 06/17/2023 FINDINGS: Lower chest: A catheter terminates in the right atrium on the top most image. Mild cardiomegaly. Hepatobiliary: Unremarkable Pancreas: Unremarkable Spleen: Unremarkable Adrenals/Urinary Tract: Unremarkable Stomach/Bowel: Diffuse gastric wall thickening with mild gastric and substantial proximal duodenal distension. After crossing the midline, the duodenum does not appear distended but there is substantial distal duodenal and jejunal wall thickening, worsened from prior exams. Scattered colonic diverticula. Appendix unremarkable. No pneumatosis. No hypoenhancement of bowel wall. There are abnormal contains small fluid collections along the small bowel mesentery, for example along the left abdomen on image 64 series 7 and along the right abdomen on image 55 series 7. In addition there is fluid density in the lesser sac on image 76 series 7. Vascular/Lymphatic: Mild atheromatous vascular disease of the aortoiliac tree. Patent celiac trunk and SMA. Patent superior mesenteric vein. Suspected left iliac adenopathy measuring about 1.3 cm in diameter on image 47 series 2, stable. This is of similar density to the adjacent vein. Reproductive: Uterus absent. Nodular enhancement along the left vaginal cuff measuring 2.5 by 1.7 cm, previously 2.3 by 1.7 cm by my measurements. Tumor in this location is not excluded. Other: There is some abnormal stranding along the remaining omentum for example on image 73 series 9. Small nodular deposit along the upper omentum on image 31 series 2. Musculoskeletal: Lower thoracic and lumbar spondylosis. Density within the umbilicus on image 102 series 9, potentially a small knuckle of bowel versus tumor. IMPRESSION: 1. Worsening of  gastric wall thickening, distal duodenal wall thickening, and jejunal wall thickening. The stomach and proximal duodenum are distended. The appearance favors gastroenteritis. 2. There is also some abnormal stranding along the remaining omentum. There are also abnormal fluid collections along the small bowel mesentery and in the lesser sac. The appearance raises concern for possible residual peritoneal tumor causing fluid deposits. Small nodular deposit along the upper omentum along with nodularity in the umbilicus. 3. Nodular enhancement along the left vaginal cuff measuring 2.5 by 1.7 cm, previously 2.3 by 1.7 cm by my measurements. This likely represents tumor. 4. Suspected left iliac adenopathy  measuring about 1.3 cm in diameter, stable. 5. Mild cardiomegaly. 6. Scattered colonic diverticula. 7. Mild atheromatous vascular disease of the aortoiliac tree. 8. Lower thoracic and lumbar spondylosis. Electronically Signed   By: Gaylyn Rong M.D.   On: 07/12/2023 14:56   DG Abd 1 View  Result Date: 07/10/2023 CLINICAL DATA:  Abdominal pain, vomiting. History of uterine cancer. EXAM: ABDOMEN - 1 VIEW COMPARISON:  April 09, 2022. FINDINGS: The bowel gas pattern is normal. Surgical clip is seen over the left sacrum. No radio-opaque calculi or other significant radiographic abnormality are seen. IMPRESSION: No abnormal bowel dilatation. Electronically Signed   By: Lupita Raider M.D.   On: 07/10/2023 14:18

## 2023-07-22 ENCOUNTER — Inpatient Hospital Stay (HOSPITAL_COMMUNITY): Payer: Medicare HMO

## 2023-07-22 DIAGNOSIS — Z4682 Encounter for fitting and adjustment of non-vascular catheter: Secondary | ICD-10-CM | POA: Diagnosis not present

## 2023-07-22 DIAGNOSIS — K311 Adult hypertrophic pyloric stenosis: Secondary | ICD-10-CM | POA: Diagnosis not present

## 2023-07-22 LAB — COMPREHENSIVE METABOLIC PANEL
ALT: 14 U/L (ref 0–44)
AST: 14 U/L — ABNORMAL LOW (ref 15–41)
Albumin: 3.1 g/dL — ABNORMAL LOW (ref 3.5–5.0)
Alkaline Phosphatase: 116 U/L (ref 38–126)
Anion gap: 5 (ref 5–15)
BUN: 8 mg/dL (ref 8–23)
CO2: 26 mmol/L (ref 22–32)
Calcium: 9 mg/dL (ref 8.9–10.3)
Chloride: 106 mmol/L (ref 98–111)
Creatinine, Ser: 0.87 mg/dL (ref 0.44–1.00)
GFR, Estimated: >60 mL/min (ref >60)
Glucose, Bld: 132 mg/dL — ABNORMAL HIGH (ref 70–99)
Potassium: 3.4 mmol/L — ABNORMAL LOW (ref 3.5–5.1)
Sodium: 137 mmol/L (ref 135–145)
Total Bilirubin: 0.4 mg/dL (ref 0.3–1.2)
Total Protein: 6.1 g/dL — ABNORMAL LOW (ref 6.5–8.1)

## 2023-07-22 LAB — GLUCOSE, CAPILLARY
Glucose-Capillary: 105 mg/dL — ABNORMAL HIGH (ref 70–99)
Glucose-Capillary: 108 mg/dL — ABNORMAL HIGH (ref 70–99)
Glucose-Capillary: 114 mg/dL — ABNORMAL HIGH (ref 70–99)
Glucose-Capillary: 120 mg/dL — ABNORMAL HIGH (ref 70–99)
Glucose-Capillary: 123 mg/dL — ABNORMAL HIGH (ref 70–99)
Glucose-Capillary: 137 mg/dL — ABNORMAL HIGH (ref 70–99)

## 2023-07-22 MED ORDER — DEXAMETHASONE SODIUM PHOSPHATE 4 MG/ML IJ SOLN: 4.0000 mg | INTRAMUSCULAR | Status: DC

## 2023-07-22 MED ORDER — PROCHLORPERAZINE EDISYLATE 10 MG/2ML IJ SOLN
5.0000 mg | Freq: Four times a day (QID) | INTRAMUSCULAR | Status: DC | PRN
  Administered 2023-07-28 – 2023-08-16 (×20): 5 mg via INTRAVENOUS
  Filled 2023-07-22 (×21): qty 2

## 2023-07-22 MED ORDER — PROCHLORPERAZINE EDISYLATE 10 MG/2ML IJ SOLN: 5.0000 mg | Freq: Four times a day (QID) | INTRAMUSCULAR | Status: DC | PRN

## 2023-07-22 MED ORDER — DEXAMETHASONE SODIUM PHOSPHATE 10 MG/ML IJ SOLN
8.0000 mg | Freq: Once | INTRAMUSCULAR | Status: AC
  Administered 2023-07-23: 8 mg via INTRAVENOUS
  Filled 2023-07-22: qty 1

## 2023-07-22 MED ORDER — DILTIAZEM HCL 30 MG PO TABS: 30.0000 mg | ORAL_TABLET | Freq: Four times a day (QID) | ORAL | Status: DC

## 2023-07-22 MED ORDER — DEXAMETHASONE SODIUM PHOSPHATE 10 MG/ML IJ SOLN
8.0000 mg | Freq: Once | INTRAMUSCULAR | Status: AC
  Administered 2023-07-22: 8 mg via INTRAVENOUS
  Filled 2023-07-22: qty 1

## 2023-07-22 MED ORDER — POTASSIUM CHLORIDE 10 MEQ/100ML IV SOLN
10.0000 meq | INTRAVENOUS | Status: AC
  Administered 2023-07-22 (×2): 10 meq via INTRAVENOUS
  Filled 2023-07-22: qty 100

## 2023-07-22 MED ORDER — CARMEX CLASSIC LIP BALM EX OINT
TOPICAL_OINTMENT | CUTANEOUS | Status: DC | PRN
  Administered 2023-08-13 – 2023-08-15 (×2): 1 via TOPICAL
  Filled 2023-07-22: qty 10

## 2023-07-22 MED ORDER — DILTIAZEM HCL 30 MG PO TABS
30.0000 mg | ORAL_TABLET | Freq: Four times a day (QID) | ORAL | Status: DC
  Administered 2023-07-23 – 2023-07-26 (×13): 30 mg
  Filled 2023-07-22 (×13): qty 1

## 2023-07-22 MED ORDER — RIVAROXABAN 20 MG PO TABS
20.0000 mg | ORAL_TABLET | Freq: Every day | ORAL | Status: DC
  Administered 2023-07-23 – 2023-07-29 (×7): 20 mg
  Filled 2023-07-22 (×7): qty 1

## 2023-07-22 MED ORDER — ALUM & MAG HYDROXIDE-SIMETH 200-200-20 MG/5ML PO SUSP
30.0000 mL | Freq: Four times a day (QID) | ORAL | Status: DC | PRN
  Administered 2023-07-31 – 2023-08-01 (×2): 30 mL
  Filled 2023-07-22: qty 30

## 2023-07-22 MED ORDER — LORAZEPAM 2 MG/ML IJ SOLN
1.0000 mg | Freq: Once | INTRAMUSCULAR | Status: AC
  Administered 2023-07-22: 1 mg via INTRAVENOUS
  Filled 2023-07-22: qty 1

## 2023-07-22 MED ORDER — PROCHLORPERAZINE EDISYLATE 10 MG/2ML IJ SOLN
5.0000 mg | Freq: Once | INTRAMUSCULAR | Status: AC | PRN
  Administered 2023-07-22: 5 mg via INTRAVENOUS
  Filled 2023-07-22: qty 2

## 2023-07-22 NOTE — Progress Notes (Signed)
PROGRESS NOTE  Sherri Gill  DOB: [birth year]  PCP: [name], MD BMW:[id number]  DOA: 07/07/2023  LOS: 8 days  Hospital Day: 9  Brief narrative: Sherri Gill is a 72 y.o. female with PMH significant for advanced uterine cancer (s/p TAH, b/l SOO) with peritoneal carcinomatosis on active chemotherapy/immunotherapy, PAF on Xarelto, T2DM, HTN, HLD, migraine, anxiety, GERD 9/14, patient presented to the ED with complaint of persistent nausea, vomiting for several days, with poor oral intake, abdominal discomfort. CT A/P showed distention and wall thickening of stomach, proximal duodenum, some abnormal stranding along the remaining omentum and abdominal fluid collections along the small bowel mesentery and in the lesser sac.  This was raising concern for possible residual peritoneal tumor causing fluid deposits.  Patient was able to tolerate food and discharged to home.  Her symptoms continue to worsen at home and hence she returned back to ED on 9/16.  In the ED, patient was afebrile, tachycardic, hemodynamically stable Labs with WC count 10.6, hemoglobin 10.6, BUN/creatinine 24/1.28, lipase 47 Abdominal x-ray was obtained which was negative for acute pathology EDP discussed with oncology Dr. [name] and GI Dr. [name] NG tube was placed after which patient started to feel better Admitted to [facility]  Patient's hospitalization has been prolonged because of persistent nausea, vomiting and slow GI transit. Mechanical obstruction was ruled out but continues to have symptoms. Oncology planning for inpatient chemotherapy on 9/25.  Subjective: Patient was seen and examined this morning.   Vomited earlier this morning.  NG tube was inserted after which she had a liter of drainage Made NPO. Seems depressed at the time of my evaluation  Assessment and plan: Partial small bowel obstruction Presented with intractable nausea and vomiting in the setting of peritoneal carcinomatosis   Suspect possible malignant stricture of proximal small bowel CT abdomen 9/14 as above showing stomach distention and raise suspicion of bowel obstruction. Decompressed with NG tube. 9/18, underwent EGD.  Normal-appearing, widely patent esophagus and GEJ, edematous gastric fold which were biopsied.  Single duodenal polyp, biopsied.  No visible structural mass causing blockage.  NGT was removed.   9/20, upper GI series x-rays was done.  9/21, KUB showed contrast progression to rectum ruling out any possibly of obstruction. Currently on IV Protonix, scheduled sucralfate and scheduled Mylanta. Despite these meds, patient continues to feel nauseous gassy and abdominal fullness.  She may probably have delayed gastric emptying from peritoneal carcinomatosis. 9/22, I started her on IV Reglan every 8 hours scheduled. QTc 374 ms on EKG 9/23.   9/24, NG tube insertion done  Advanced uterine cancer  (s/p TAH, b/l SOO) with peritoneal carcinomatosis on active chemotherapy/immunotherapy Follows with oncology, Dr. [name].   Noted a plan to start chemotherapy inpatient starting 9/25  Hypokalemia/hypomagnesemia Potassium level low at 3.4 today..  Replacement given. Recent Labs  Lab 07/21/2023 0515 07/17/23 2725 07/18/23 0525 07/19/23 0525 07/21/23 0500 07/22/23 0600  K 2.9* 3.2* 3.4* 3.6 3.2* 3.4*  MG 1.5*  --   --   --   --   --   PHOS 3.0  --   --   --   --   --    Acute kidney injury: Mild due to the poor oral intake and GI losses.  Creatinine improving with hydration. Recent Labs    07/10/23 1331 07/12/23 1249 07/03/2023 1334 07/15/23 0320 06/29/2023 0515 07/17/23 3664 07/18/23 0525 07/19/23 0525 07/21/23 0500 07/22/23 0600  BUN 20 19 24* 17 10 6* 7* 8 9  8  CREATININE 0.87 0.82 1.28* 0.97 0.67 0.68 0.70 0.73 0.71 0.87   Paroxysmal atrial fibrillation Remains in sinus rhythm with controlled rate.   PTA meds- Cardizem 120 mg daily, Xarelto. Both have been  resumed.   Hypertension: PTA meds- losartan 100 mg daily, Cardizem 120 mg daily Currently on Cardizem.  Losartan remains on hold.  Blood pressure mostly less than 150.   Continue IV hydralazine  Type 2 diabetes mellitus Hypoglycemia A1c 6.4 on April 2024 PTA meds-metformin 1000 mg twice daily, currently on hold Because of poor appetite, blood sugar level is running low.  Continue D5 NS at 75 mL/h Continue SSI/Accu-Cheks Recent Labs  Lab 07/21/23 2008 07/22/23 0004 07/22/23 0401 07/22/23 0756 07/22/23 1133  GLUCAP 75 120* 114* 123* 108*   Anemia chronic disease Vitamin B12 deficiency Hemoglobin at baseline close to 9.  No active bleeding.  Continue to monitor. Resume vitamin B12 supplement when able to. Recent Labs    03/21/23 0912 03/25/23 1541 07-22-2023 0515 07/17/23 8119 07/18/23 0525 07/19/23 0525 07/21/23 0500  HGB  --    < > 8.7* 8.4* 8.8* 8.2* 8.6*  MCV  --    < > 88.9 89.9 90.5 90.9 89.1  VITAMINB12 127*  --   --   --   --   --   --   FERRITIN 232  --   --   --   --   --   --   TIBC 260  --   --   --   --   --   --   IRON 42  --   --   --   --   --   --    < > = values in this interval not displayed.   Mobility: Independent at baseline.  Continue to encourage ambulation  Goals of care   Code Status: Full Code     DVT prophylaxis:  SCDs Start: 07/06/2023 2156 rivaroxaban (XARELTO) tablet 20 mg   Antimicrobials: None Fluid: D5 NS at 75 mL/h Consultants: Oncology, GI Family Communication: None at bedside  Status: Inpatient Level of care:  Med-Surg   Patient is from: Home Needs to continue in-hospital care: Continues to feel nauseous, uncomfortable and gassy.  Noted plan from oncology to initiate chemotherapy inpatient on 9/25 Anticipated d/c to: Pending clinical course    Diet:  Diet Order             Diet NPO time specified  Diet effective now                   Scheduled Meds:  Chlorhexidine Gluconate Cloth  6 each Topical Daily    dexamethasone (DECADRON) injection  8 mg Intravenous Once   [START ON 07/23/2023] dexamethasone (DECADRON) injection  8 mg Intravenous Once   [START ON 07/23/2023] diltiazem  30 mg Per Tube Q6H   dorzolamide-timolol  1 drop Both Eyes BID   insulin aspart  0-9 Units Subcutaneous Q4H   latanoprost  1 drop Both Eyes QHS   metoCLOPramide (REGLAN) injection  10 mg Intravenous Q8H   pantoprazole (PROTONIX) IV  40 mg Intravenous BID AC   [START ON 07/23/2023] rivaroxaban  20 mg Per Tube Daily    PRN meds: acetaminophen, alum & mag hydroxide-simeth, hydrALAZINE, HYDROmorphone (DILAUDID) injection, metoprolol tartrate, ondansetron (ZOFRAN) IV, prochlorperazine   Infusions:   dextrose 5 % and 0.9 % NaCl 75 mL/hr at 07/22/23 1443   famotidine (PEPCID) IV Stopped (07/21/23 2313)  potassium chloride      Antimicrobials: Anti-infectives (From admission, onward)    None       Objective: Vitals:   07/22/23 0400 07/22/23 1415  BP: (!) 146/64 (!) 160/64  Pulse: 72 84  Resp: 18 (!) 22  Temp: 98.6 F (37 C) 98.2 F (36.8 C)  SpO2: 97% 99%    Intake/Output Summary (Last 24 hours) at 07/22/2023 1452 Last data filed at 07/22/2023 0959 Gross per 24 hour  Intake 1072.09 ml  Output 850 ml  Net 222.09 ml    Filed Weights   07/05/2023 0410 07/19/2023 1300  Weight: 80.9 kg 80.9 kg   Weight change:  Body mass index is 31.59 kg/m.   Physical Exam: General exam: Pleasant elderly African-American female.  NG tube in for decompression Skin: No rashes, lesions or ulcers. HEENT: Atraumatic, normocephalic, no obvious bleeding Lungs: Clear to auscultate bilaterally CVS: Regular rate and rhythm, no murmur GI/Abd soft, feels distended nauseous.  Tenderness mild.  Bowel sound . CNS: Alert, awake, oriented x 3 Psychiatry: Frustrated Extremities: No edema, no calf tenderness  Data Review: I have personally reviewed the laboratory data and studies available.  F/u labs ordered Unresulted Labs (From  admission, onward)     Start     Ordered   07/23/23 0500  CBC with Differential/Platelet  Tomorrow morning,   R       Question:  Specimen collection method  Answer:  Unit=Unit collect   07/22/23 1452   07/23/23 0500  Basic metabolic panel  Tomorrow morning,   R       Question:  Specimen collection method  Answer:  Unit=Unit collect   07/22/23 1452   07/23/23 0500  Magnesium  Tomorrow morning,   R       Question:  Specimen collection method  Answer:  Unit=Unit collect   07/22/23 1452   07/23/23 0500  Phosphorus  Tomorrow morning,   R       Question:  Specimen collection method  Answer:  Unit=Unit collect   07/22/23 1452            Total time spent in review of labs and imaging, patient evaluation, formulation of plan, documentation and communication with family: 45 minutes  Signed, Lorin Glass, MD [facility] 07/22/2023

## 2023-07-22 NOTE — Progress Notes (Signed)
Sherri Gill   DOB:01/11/1951   ZO#:[id number]    ASSESSMENT & PLAN:  Recurrent uterine cancer She presents with partial small bowel obstruction Appreciate GI consult Already feeling better with NG tube decompression and IV fluids I reviewed the operative note from her EGD and biopsy results Biopsy is taken but no signs of malignant obstruction, negative for malignancy Further imaging studies show no definitive signs of obstruction but possibly interference with peristalsis from peritoneal disease At current state, she is not able to maintain nutritional state as she has chronic nausea vomiting   We discussed risk, benefits, side effects of chemotherapy I recommend resumption of chemotherapy with carboplatin and paclitaxel which she tolerated well in the past I will make arrangement for her to resume chemotherapy possibly on Wednesday this week In the meantime, continue supportive care   Nausea, vomiting and dehydration signs of recurrent SBO She was feeling better with nothing by mouth, IV fluids and antiemetics as needed NG tube was removed She will continue antiemetics as needed and slowly advance diet with clear liquid diet There is a component of GERD Despite scheduled IV Reglan, she had recurrent vomiting and her abdomen is getting more distended indicated for signs of recurrent small bowel obstruction I recommend NG tube decompression for few days until we can get her started on chemotherapy Ultimately, she agreed to proceed Will restart IV fluid to keep her hydrated   Anemia chronic disease She is not symptomatic Observe only   Dehydration, mild hypokalemia and other electrolyte imbalance Likely due to poor oral intake Replace as needed   Discharge planning She cannot be discharged at current state and would need inpatient chemotherapy with the chance that she might improve Discharge date is unknown  All questions were answered. The patient knows to call the clinic  with any problems, questions or concerns.   The total time spent in the appointment was 55 minutes encounter with patients including review of chart and various tests results, discussions about plan of care and coordination of care plan  Artis Delay, MD 07/22/2023 9:00 AM  Subjective:  She is not feeling well.  She has recurrent nausea vomiting yesterday after eating.  Her abdomen looks progressively distended We discussed risk and benefits of NG tube placement and plan for chemotherapy tomorrow  Objective:  Vitals:   07/21/23 2011 07/22/23 0400  BP: (!) 141/64 (!) 146/64  Pulse: 69 72  Resp: 20 18  Temp: 98.6 F (37 C) 98.6 F (37 C)  SpO2: 97% 97%     Intake/Output Summary (Last 24 hours) at 07/22/2023 0900 Last data filed at 07/22/2023 0630 Gross per 24 hour  Intake 1072.09 ml  Output --  Net 1072.09 ml    GENERAL:alert, no distress and comfortable ABDOMEN:abdomen is grossly distended NEURO: alert & oriented x 3    Labs:  Recent Labs    07/12/23 1249 07/06/2023 1334 07/15/23 0320 07/19/23 0525 07/21/23 0500 07/22/23 0600  NA 134* 135   < > 136 136 137  K 2.8* 3.6   < > 3.6 3.2* 3.4*  CL 96* 97*   < > 106 104 106  CO2 27 28   < > 24 25 26   GLUCOSE 110* 104*   < > 165* 118* 132*  BUN 19 24*   < > 8 9 8   CREATININE 0.82 1.28*   < > 0.73 0.71 0.87  CALCIUM 9.6 9.8   < > 8.5* 8.7* 9.0  GFRNONAA >60 45*   < > >  60 >60 >60  PROT 7.1 7.3  --   --   --  6.1*  ALBUMIN 3.9 4.2  --   --   --  3.1*  AST 12* 10*  --   --   --  14*  ALT 13 8  --   --   --  14  ALKPHOS 108 117  --   --   --  116  BILITOT 0.9 0.3  --   --   --  0.4   < > = values in this interval not displayed.    Studies:  DG Abd 1 View  Result Date: 07/19/2023 CLINICAL DATA:  [id number] SBO (small bowel obstruction) (HCC) [id number] EXAM: ABDOMEN - 1 VIEW COMPARISON:  July 18, 2023 FINDINGS: Enteric contrast has progressed to the rectum. Multiple colonic diverticuli. No dilated loops of bowel are seen.  IMPRESSION: Enteric contrast has progressed to the rectum. Electronically Signed   By: Meda Klinefelter M.D.   On: 07/19/2023 10:22   DG UGI W SINGLE CM (SOL OR THIN BA)  Result Date: 07/18/2023 CLINICAL DATA:  Endometrial/uterine cancer. Fold thickening in the stomach on recent endoscopy. Reflux and bloating. Difficulty keeping foods and liquids down. EXAM: UPPER GI SERIES WITH KUB TECHNIQUE: After obtaining a scout radiograph a routine upper GI series was performed using thin barium. Mobility was mildly limited on today's exam, for example the patient was unable to turn prone. FLUOROSCOPY: Radiation Exposure Index (as provided by the fluoroscopic device): 21.7 mGy Kerma COMPARISON:  CT abdomen 07/12/2023 FINDINGS: Initial KUB unremarkable aside from mild levoconvex lumbar scoliosis. The pharyngeal phase of swallowing appears normal. Primary peristaltic waves were intact on 4/4 swallows. Small type 1 hiatal hernia. Occasional gastroesophageal reflux was observed on today's exam. I used mucosal relief and balloon compression of the stomach in order to help mitigate the use of thin barium and single contrast technique as well as the patient is inability to perform prone imaging. These demonstrate a gastric fold thickening as shown on prior CT and prior endoscopy. The duodenum is mildly dilated, especially proximally. Eventually this proceeded on to the fourth portion of the duodenum as shown on image 1 series 15. However, the contrast column was sluggish to flow through the duodenum, and given the somewhat dilated appearance, the possibility of obstruction distally is not excluded. Consider following the barium column with serial KUB use in assessing for small bowel obstruction in several hours. A 13 mm barium tablet passed briskly into the stomach. Port-A-Cath noted. IMPRESSION: 1. Gastric fold thickening as shown on prior CT and endoscopy. 2. Small type 1 hiatal hernia. 3. Intermittent gastroesophageal  reflux. 4. Mildly dilated duodenum, with sluggish flow of contrast through the duodenum. The possibility of a more distal small bowel obstruction is not excluded given this appearance. Consider KUB follow-up in several hours to assess for transit of the barium column through the small bowel in assessing for small bowel obstruction. Electronically Signed   By: Gaylyn Rong M.D.   On: 07/18/2023 12:00   DG Abd Portable 1 View  Result Date: 06/30/2023 CLINICAL DATA:  Nasogastric tube placement. EXAM: PORTABLE ABDOMEN - 1 VIEW COMPARISON:  Abdomen and pelvis CT dated 07/12/2023. FINDINGS: Normal bowel-gas pattern. Interval nasogastric tube with its tip and side hole in the proximal to mid stomach. Lower thoracic spine degenerative changes. IMPRESSION: Nasogastric tube tip and side hole in the proximal to mid stomach. Electronically Signed   By: Beckie Salts M.D.   On:  06/30/2023 17:09   DG Abdomen Acute W/Chest  Result Date: 07/08/2023 CLINICAL DATA:  Vomiting EXAM: DG ABDOMEN ACUTE WITH 1 VIEW CHEST COMPARISON:  07/10/2023. FINDINGS: There is no evidence of dilated bowel loops or free intraperitoneal air. No radiopaque calculi or other significant radiographic abnormality is seen. Heart size and mediastinal contours are within normal limits. Both lungs are clear. There are thoracic degenerative changes. Right-sided Port-A-Cath tip overlies distal SVC. IMPRESSION: Negative abdominal radiographs.  No acute cardiopulmonary disease. Electronically Signed   By: Layla Maw M.D.   On: 07/25/2023 14:40   CT ABDOMEN PELVIS W CONTRAST  Result Date: 07/12/2023 CLINICAL DATA:  Endometrial/uterine cancer.  Nausea and vomiting. * Tracking Code: BO * EXAM: CT ABDOMEN AND PELVIS WITH CONTRAST TECHNIQUE: Multidetector CT imaging of the abdomen and pelvis was performed using the standard protocol following bolus administration of intravenous contrast. RADIATION DOSE REDUCTION: This exam was performed according to  the departmental dose-optimization program which includes automated exposure control, adjustment of the mA and/or kV according to patient size and/or use of iterative reconstruction technique. CONTRAST:  OMNIPAQUE IOHEXOL 300 MG/ML  SOLN COMPARISON:  CT abdomen/pelvis from 06/17/2023 FINDINGS: Lower chest: A catheter terminates in the right atrium on the top most image. Mild cardiomegaly. Hepatobiliary: Unremarkable Pancreas: Unremarkable Spleen: Unremarkable Adrenals/Urinary Tract: Unremarkable Stomach/Bowel: Diffuse gastric wall thickening with mild gastric and substantial proximal duodenal distension. After crossing the midline, the duodenum does not appear distended but there is substantial distal duodenal and jejunal wall thickening, worsened from prior exams. Scattered colonic diverticula. Appendix unremarkable. No pneumatosis. No hypoenhancement of bowel wall. There are abnormal contains small fluid collections along the small bowel mesentery, for example along the left abdomen on image 64 series 7 and along the right abdomen on image 55 series 7. In addition there is fluid density in the lesser sac on image 76 series 7. Vascular/Lymphatic: Mild atheromatous vascular disease of the aortoiliac tree. Patent celiac trunk and SMA. Patent superior mesenteric vein. Suspected left iliac adenopathy measuring about 1.3 cm in diameter on image 47 series 2, stable. This is of similar density to the adjacent vein. Reproductive: Uterus absent. Nodular enhancement along the left vaginal cuff measuring 2.5 by 1.7 cm, previously 2.3 by 1.7 cm by my measurements. Tumor in this location is not excluded. Other: There is some abnormal stranding along the remaining omentum for example on image 73 series 9. Small nodular deposit along the upper omentum on image 31 series 2. Musculoskeletal: Lower thoracic and lumbar spondylosis. Density within the umbilicus on image 102 series 9, potentially a small knuckle of bowel versus  tumor. IMPRESSION: 1. Worsening of gastric wall thickening, distal duodenal wall thickening, and jejunal wall thickening. The stomach and proximal duodenum are distended. The appearance favors gastroenteritis. 2. There is also some abnormal stranding along the remaining omentum. There are also abnormal fluid collections along the small bowel mesentery and in the lesser sac. The appearance raises concern for possible residual peritoneal tumor causing fluid deposits. Small nodular deposit along the upper omentum along with nodularity in the umbilicus. 3. Nodular enhancement along the left vaginal cuff measuring 2.5 by 1.7 cm, previously 2.3 by 1.7 cm by my measurements. This likely represents tumor. 4. Suspected left iliac adenopathy measuring about 1.3 cm in diameter, stable. 5. Mild cardiomegaly. 6. Scattered colonic diverticula. 7. Mild atheromatous vascular disease of the aortoiliac tree. 8. Lower thoracic and lumbar spondylosis. Electronically Signed   By: Gaylyn Rong M.D.   On: 07/12/2023 14:56  DG Abd 1 View  Result Date: 07/10/2023 CLINICAL DATA:  Abdominal pain, vomiting. History of uterine cancer. EXAM: ABDOMEN - 1 VIEW COMPARISON:  April 09, 2022. FINDINGS: The bowel gas pattern is normal. Surgical clip is seen over the left sacrum. No radio-opaque calculi or other significant radiographic abnormality are seen. IMPRESSION: No abnormal bowel dilatation. Electronically Signed   By: Lupita Raider M.D.   On: 07/10/2023 14:18

## 2023-07-22 NOTE — Progress Notes (Signed)
NG tube placed per orders.  Abdominal xray ordered for confirmation.  Output was 850 ml of yellow gastric contents immediately upon insertion.

## 2023-07-23 ENCOUNTER — Telehealth: Payer: Self-pay

## 2023-07-23 DIAGNOSIS — K311 Adult hypertrophic pyloric stenosis: Secondary | ICD-10-CM | POA: Diagnosis not present

## 2023-07-23 DIAGNOSIS — E440 Moderate protein-calorie malnutrition: Secondary | ICD-10-CM | POA: Insufficient documentation

## 2023-07-23 LAB — CBC WITH DIFFERENTIAL/PLATELET
Abs Immature Granulocytes: 0.03 10*3/uL (ref 0.00–0.07)
Basophils Absolute: 0 10*3/uL (ref 0.0–0.1)
Basophils Relative: 0 %
Eosinophils Absolute: 0 10*3/uL (ref 0.0–0.5)
Eosinophils Relative: 0 %
HCT: 26.2 % — ABNORMAL LOW (ref 36.0–46.0)
Hemoglobin: 8.1 g/dL — ABNORMAL LOW (ref 12.0–15.0)
Immature Granulocytes: 0 %
Lymphocytes Relative: 13 %
Lymphs Abs: 0.9 10*3/uL (ref 0.7–4.0)
MCH: 27.3 pg (ref 26.0–34.0)
MCHC: 30.9 g/dL (ref 30.0–36.0)
MCV: 88.2 fL (ref 80.0–100.0)
Monocytes Absolute: 0.3 10*3/uL (ref 0.1–1.0)
Monocytes Relative: 5 %
Neutro Abs: 5.6 10*3/uL (ref 1.7–7.7)
Neutrophils Relative %: 82 %
Platelets: 253 10*3/uL (ref 150–400)
RBC: 2.97 MIL/uL — ABNORMAL LOW (ref 3.87–5.11)
RDW: 15.6 % — ABNORMAL HIGH (ref 11.5–15.5)
WBC: 6.9 10*3/uL (ref 4.0–10.5)
nRBC: 0 % (ref 0.0–0.2)

## 2023-07-23 LAB — GLUCOSE, CAPILLARY
Glucose-Capillary: 123 mg/dL — ABNORMAL HIGH (ref 70–99)
Glucose-Capillary: 129 mg/dL — ABNORMAL HIGH (ref 70–99)
Glucose-Capillary: 133 mg/dL — ABNORMAL HIGH (ref 70–99)
Glucose-Capillary: 140 mg/dL — ABNORMAL HIGH (ref 70–99)
Glucose-Capillary: 141 mg/dL — ABNORMAL HIGH (ref 70–99)
Glucose-Capillary: 150 mg/dL — ABNORMAL HIGH (ref 70–99)
Glucose-Capillary: 151 mg/dL — ABNORMAL HIGH (ref 70–99)

## 2023-07-23 LAB — COMPREHENSIVE METABOLIC PANEL
ALT: 13 U/L (ref 0–44)
AST: 11 U/L — ABNORMAL LOW (ref 15–41)
Albumin: 2.9 g/dL — ABNORMAL LOW (ref 3.5–5.0)
Alkaline Phosphatase: 103 U/L (ref 38–126)
Anion gap: 9 (ref 5–15)
BUN: 10 mg/dL (ref 8–23)
CO2: 21 mmol/L — ABNORMAL LOW (ref 22–32)
Calcium: 8.6 mg/dL — ABNORMAL LOW (ref 8.9–10.3)
Chloride: 107 mmol/L (ref 98–111)
Creatinine, Ser: 0.67 mg/dL (ref 0.44–1.00)
GFR, Estimated: >60 mL/min (ref >60)
Glucose, Bld: 164 mg/dL — ABNORMAL HIGH (ref 70–99)
Potassium: 3.1 mmol/L — ABNORMAL LOW (ref 3.5–5.1)
Sodium: 137 mmol/L (ref 135–145)
Total Bilirubin: 0.8 mg/dL (ref 0.3–1.2)
Total Protein: 5.9 g/dL — ABNORMAL LOW (ref 6.5–8.1)

## 2023-07-23 LAB — BASIC METABOLIC PANEL
Anion gap: 5 (ref 5–15)
BUN: 9 mg/dL (ref 8–23)
CO2: 24 mmol/L (ref 22–32)
Calcium: 8.7 mg/dL — ABNORMAL LOW (ref 8.9–10.3)
Chloride: 106 mmol/L (ref 98–111)
Creatinine, Ser: 0.78 mg/dL (ref 0.44–1.00)
GFR, Estimated: >60 mL/min (ref >60)
Glucose, Bld: 147 mg/dL — ABNORMAL HIGH (ref 70–99)
Potassium: 3.7 mmol/L (ref 3.5–5.1)
Sodium: 135 mmol/L (ref 135–145)

## 2023-07-23 LAB — MAGNESIUM: Magnesium: 1.6 mg/dL — ABNORMAL LOW (ref 1.7–2.4)

## 2023-07-23 LAB — PHOSPHORUS
Phosphorus: 3.4 mg/dL (ref 2.5–4.6)
Phosphorus: 3.4 mg/dL (ref 2.5–4.6)

## 2023-07-23 MED ORDER — FAMOTIDINE 20 MG IN NS 100 ML IVPB
20.0000 mg | Freq: Once | INTRAVENOUS | Status: DC
  Filled 2023-07-23: qty 100

## 2023-07-23 MED ORDER — SODIUM CHLORIDE 0.9 % IV SOLN
175.0000 mg/m2 | Freq: Once | INTRAVENOUS | Status: AC
  Administered 2023-07-23: 330 mg via INTRAVENOUS
  Filled 2023-07-23: qty 55

## 2023-07-23 MED ORDER — CETIRIZINE HCL 10 MG/ML IV SOLN
10.0000 mg | Freq: Once | INTRAVENOUS | Status: AC
  Administered 2023-07-23: 10 mg via INTRAVENOUS
  Filled 2023-07-23: qty 1

## 2023-07-23 MED ORDER — LEVOTHYROXINE SODIUM 100 MCG/5ML IV SOLN
37.5000 ug | Freq: Every day | INTRAVENOUS | Status: DC
  Administered 2023-07-23 – 2023-07-26 (×4): 37.5 ug via INTRAVENOUS
  Filled 2023-07-23 (×4): qty 5

## 2023-07-23 MED ORDER — SODIUM CHLORIDE 0.9 % IV SOLN
10.0000 mg | Freq: Once | INTRAVENOUS | Status: AC
  Administered 2023-07-23: 10 mg via INTRAVENOUS
  Filled 2023-07-23: qty 1

## 2023-07-23 MED ORDER — SODIUM CHLORIDE 0.9 % IV SOLN
150.0000 mg | Freq: Once | INTRAVENOUS | Status: AC
  Administered 2023-07-23: 150 mg via INTRAVENOUS
  Filled 2023-07-23: qty 5

## 2023-07-23 MED ORDER — FAMOTIDINE IN NACL 20-0.9 MG/50ML-% IV SOLN
20.0000 mg | Freq: Once | INTRAVENOUS | Status: AC
  Administered 2023-07-23: 20 mg via INTRAVENOUS
  Filled 2023-07-23: qty 50

## 2023-07-23 MED ORDER — SODIUM CHLORIDE 0.9 % IV SOLN
462.0000 mg | Freq: Once | INTRAVENOUS | Status: AC
  Administered 2023-07-23: 460 mg via INTRAVENOUS
  Filled 2023-07-23: qty 46

## 2023-07-23 MED ORDER — PALONOSETRON HCL INJECTION 0.25 MG/5ML
0.2500 mg | Freq: Once | INTRAVENOUS | Status: AC
  Administered 2023-07-23: 0.25 mg via INTRAVENOUS
  Filled 2023-07-23: qty 5

## 2023-07-23 MED ORDER — LEVOTHYROXINE SODIUM 100 MCG/5ML IV SOLN: 25.0000 ug | Freq: Every day | INTRAVENOUS | Status: DC

## 2023-07-23 MED ORDER — COLD PACK MISC ONCOLOGY: 1.0000 | Freq: Once | Status: AC | PRN

## 2023-07-23 MED ORDER — SODIUM CHLORIDE 0.9 % IV SOLN: Freq: Once | INTRAVENOUS | Status: AC

## 2023-07-23 NOTE — Progress Notes (Signed)
Initial Nutrition Assessment  DOCUMENTATION CODES:   Non-severe (moderate) malnutrition in context of chronic illness  INTERVENTION:   -Recommend TPN initiation given pt's poor PO for 8 days now and even before admission. NGT with a lot of output. -Patient will be at refeeding risk -Recommend 100 mg Thiamine daily x 5 days  -Will monitor for plan  NUTRITION DIAGNOSIS:   Moderate Malnutrition related to chronic illness, cancer and cancer related treatments as evidenced by mild muscle depletion, energy intake < or equal to 75% for > or equal to 1 month.  GOAL:   Patient will meet greater than or equal to 90% of their needs  MONITOR:   Labs, Weight trends, I & O's  REASON FOR ASSESSMENT:   LOS, Malnutrition Screening Tool    ASSESSMENT:   72 y.o. 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  9/16: admitted, NPO, NGT placed 9/18: NGT removed, CLD ->Regular diet ->CLD 9/19: Soft diet ->FLD 9/23: Soft diet 9/24: NPO, NGT replaced  Patient about to start chemo infusion today. Pt reports last eating sausage, eggs, and grits on 9/16. Was having N/V and bloating since August 30th after eating what she thinks was bad shrimp. Has struggled to eat enough since. Reports she had a paracentesis, last documented in March.   NGT in place, output increasing during visit, output in cannister at bedside 800 ml. Pt points out red flecks in output, states she believes this is part of the sausage she ate 9/16. 850 ml documented yesterday.   Pt reports UBW has been ~170 lbs.  Weight currently 181 lbs. Continue to monitor weights.  Medications: Reglan, D5 infusion, Pepcid, Emend  Labs  reviewed: CBGs: 105-151 Low Mg   NUTRITION - FOCUSED PHYSICAL EXAM:  Flowsheet Row Most Recent Value  Orbital Region No depletion  Upper Arm Region No depletion  Thoracic and Lumbar Region No depletion  Buccal Region Mild depletion  Temple Region Mild depletion  Clavicle Bone Region Mild depletion  Clavicle and Acromion Bone Region No depletion  Scapular Bone Region No depletion  Dorsal Hand No depletion  Patellar Region No depletion  Anterior Thigh Region No depletion  Posterior Calf Region No depletion  Edema (RD Assessment) None  Hair Unable to assess  [covered]  Eyes Reviewed  Mouth Reviewed  Skin Reviewed       Diet Order:   Diet Order             Diet NPO time specified  Diet effective now                   EDUCATION NEEDS:   No education needs have been identified at this time  Skin:  Skin Assessment: Reviewed RN Assessment  Last BM:  9/24 -type 6  Height:   Ht Readings from Last 1 Encounters:  07/16/23 5\' 3"  (1.6 m)    Weight:   Wt Readings from Last 1 Encounters:  07/23/23 82.2 kg    BMI:  Body mass index is 32.1 kg/m.  Estimated Nutritional Needs:   Kcal:  1650-1850  Protein:  85-100g  Fluid:  1.8L/day   Tilda Franco, MS, RD, LDN Inpatient Clinical Dietitian Contact information available via Amion

## 2023-07-23 NOTE — Telephone Encounter (Signed)
Pt and pt's daughter called and LVM regarding ice and paclitaxel treatment stating that she is concerned if she doesn't get ice her fingernails will turn brittle. This RN returned the call to the pt's daughter, with the patient also on the line, to discuss what they had questions about. Pt states that she is currently admitted at [facility] hospital and is supposed to receive chemotherapy while she is there and has always received ice on her hands and feet for her "3 hour infusion."  Upon reading her treatment plan, this RN states that her 180 minute Paclitaxel infusion would be the treatment that she would receive ice for. Pt and pt's daughter wanted to seek clarification because per pt, she states that the "nurses here act like they don't know what I'm talking about" when bringing up the ice with infusions.   This RN proceeds to clarify that she can get ice for her hands and feet during her Paclitaxel infusion but it is just not needed for her Carboplatin. This RN asked pt if she can still ask for ice and if they will bring it to her for her infusion. Pt states that they are "not listening to her" when she asks for it.   This RN states that she can call the charge nurse to see if she can receive ice and to figure out what is going on. Upon offering that to the pt, a staff member enters the room and states that she is going to bring her the ice when it is time for her infusion. This RN clarifies with pt that the staff member stated that she would bring her ice. Pt states that the staff member is from "mobility" and said that they would bring her ice for her infusion but they are not doing it "right now." This RN verbalized that she will only need it during her Paclitaxel infusion.   This RN states that she will hold off on calling the charge nurse if they are bringing her the ice for her infusion and does not have any further concerns. Pt verbalized understanding.

## 2023-07-23 NOTE — Plan of Care (Signed)
  Problem: Education: Goal: Knowledge of General Education information will improve Description: Including pain rating scale, medication(s)/side effects and non-pharmacologic comfort measures Outcome: Progressing   Problem: Health Behavior/Discharge Planning: Goal: Ability to manage health-related needs will improve Outcome: Progressing   Problem: Clinical Measurements: Goal: Ability to maintain clinical measurements within normal limits will improve Outcome: Progressing Goal: Will remain free from infection Outcome: Progressing Goal: Respiratory complications will improve Outcome: Progressing Goal: Cardiovascular complication will be avoided Outcome: Progressing   Problem: Clinical Measurements: Goal: Will remain free from infection Outcome: Progressing   Problem: Clinical Measurements: Goal: Respiratory complications will improve Outcome: Progressing   Problem: Clinical Measurements: Goal: Cardiovascular complication will be avoided Outcome: Progressing   Problem: Activity: Goal: Risk for activity intolerance will decrease Outcome: Progressing   Problem: Nutrition: Goal: Adequate nutrition will be maintained Outcome: Progressing   Problem: Coping: Goal: Level of anxiety will decrease Outcome: Progressing   Problem: Elimination: Goal: Will not experience complications related to bowel motility Outcome: Progressing   Problem: Pain Managment: Goal: General experience of comfort will improve Outcome: Progressing   Problem: Safety: Goal: Ability to remain free from injury will improve Outcome: Progressing   Problem: Skin Integrity: Goal: Risk for impaired skin integrity will decrease Outcome: Progressing   Problem: Nutritional: Goal: Maintenance of adequate nutrition will improve Outcome: Progressing   Problem: Skin Integrity: Goal: Risk for impaired skin integrity will decrease Outcome: Progressing   Problem: Tissue Perfusion: Goal: Adequacy of tissue  perfusion will improve Outcome: Progressing

## 2023-07-23 NOTE — Progress Notes (Signed)
HOSPITALIST ROUNDING NOTE Sherri Gill:[id number]  DOB: [birth year]  DOA: 07/19/2023  PCP: [name], MD  07/23/2023,7:38 AM   LOS: 9 days      Code Status: Full    From:      Current Dispo:      72 year old black female Prior HSV meningitis in 2015 DM TY 2 HTN HLD IDA Paroxysmal A-fib on Xarelto Metastatic serous carcinoma of uterus status post omental BH TSO Dr. [name] Seen several times over the past 2 weeks for poor p.o. intake with constant nausea vomiting + constipation and straining despite MiraLAX senna  Seen in ED 07/12/2023 for symptoms CT ABD = gastric wall thickening distal duodenal wall thickening and junctional wall thickening with stomach and proximal duodenal distention?  Residual peritoneal tumor-patient was able to tolerate food and was discharged home  Re-presented 07/17/2023 to the med center [facility] Pulse 111 Tmax 97 WBC 10.6 BUN/creatinine 24/1.2 acute abdominal x-rays negative-NG tube was placed secondary to possible malignant stricture of proximal small bowel GI Dr. [name] consulted 2/2?  Need upper endoscopy patient kept n.p.o. p.o. 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 Pricilla Holm Resumed 9/25 Rx carboplatin paclitaxel Keep NG tube in place and is n.p.o.-switch most meds to IV as best possible IV Reglan 10 every 8 famotidine 20 at bedtime Protonix 40 twice daily If prolonged SBO may require TPN-will discuss with Dr. Tanya Nones  Paroxysmal A-fib CHADVASC >4 on Xarelto Seems okay to continue continue Xarelto orally?  If not we will switch to Lovenox/heparin gtt. Continue Cardizem 30 every 6 with metoprolol IV for heart rate above 120  HTN holding hydralazine, losartan, atorvastatin etc. etc.  DM TY 2 now  n.p.o. A1c recently 6.4 Home metformin 1000 twice daily continue D5 NS 75 cc/H CBGs trending only 120s to 150s so only sliding scale coverage note that she received Decadron so monitor carefully  Hypothyroid Synthroid on hold since admission-May require replacement IV-dose 25 mcg  AKI, hypokalemia-treated-periodic labs Check magnesium only if potassium also drops  Normocytic anemia query B12 deficiency Trend as needed  DVT prophylaxis: Xarelto p.o.  Status is: Inpatient Remains inpatient appropriate because:   Requires resolution of issues    Subjective: Looks comfortable-NG succus yellow in appearance No distress-no flatus no stool  Objective + exam Vitals:   07/22/23 0400 07/22/23 1415 07/22/23 2035 07/23/23 0422  BP: (!) 146/64 (!) 160/64 (!) 149/68 (!) 153/65  Pulse: 72 84 74 62  Resp: 18 (!) 22 18 18   Temp: 98.6 F (37 C) 98.2 F (36.8 C) 98.2 F (36.8 C) 97.7 F (36.5 C)  TempSrc: Oral Oral Oral Oral  SpO2: 97% 99% 95% 97%  Weight:    82.2 kg  Height:       Filed Weights   07/05/2023 0410 07/09/2023 1300 07/23/23 0422  Weight: 80.9 kg 80.9 kg 82.2 kg    Examination:  Looks younger than  stated age no distress, NG tube in place CTAB no added sound--- right-sided port noted Abdomen soft slightly distended no rebound S1-S2 slight tachycardia seems sinus No lower extremity edema  Data Reviewed: reviewed   CBC    Component Value Date/Time   WBC 6.9 07/23/2023 0500   RBC 2.97 (L) 07/23/2023 0500   HGB 8.1 (L) 07/23/2023 0500  HGB 10.1 (L) 07/10/2023 1331   HGB 9.8 (L) 03/25/2023 1541   HCT 26.2 (L) 07/23/2023 0500   HCT 31.5 (L) 03/25/2023 1541   PLT 253 07/23/2023 0500   PLT 384 07/10/2023 1331   PLT 237 03/25/2023 1541   MCV 88.2 07/23/2023 0500   MCV 84 03/25/2023 1541   MCH 27.3 07/23/2023 0500   MCHC 30.9 07/23/2023 0500   RDW 15.6 (H) 07/23/2023 0500   RDW 17.0 (H) 03/25/2023 1541   LYMPHSABS 0.9 07/23/2023 0500   LYMPHSABS 2.8 05/31/2021  1531   MONOABS 0.3 07/23/2023 0500   EOSABS 0.0 07/23/2023 0500   EOSABS 0.3 05/31/2021 1531   BASOSABS 0.0 07/23/2023 0500   BASOSABS 0.0 05/31/2021 1531      Latest Ref Rng & Units 07/23/2023    5:00 AM 07/22/2023    6:00 AM 07/21/2023    5:00 AM  CMP  Glucose 70 - 99 mg/dL 782  956  213   BUN 8 - 23 mg/dL 9  8  9    Creatinine 0.44 - 1.00 mg/dL 0.86  5.78  4.69   Sodium 135 - 145 mmol/L 135  137  136   Potassium 3.5 - 5.1 mmol/L 3.7  3.4  3.2   Chloride 98 - 111 mmol/L 106  106  104   CO2 22 - 32 mmol/L 24  26  25    Calcium 8.9 - 10.3 mg/dL 8.7  9.0  8.7   Total Protein 6.5 - 8.1 g/dL  6.1    Total Bilirubin 0.3 - 1.2 mg/dL  0.4    Alkaline Phos 38 - 126 U/L  116    AST 15 - 41 U/L  14    ALT 0 - 44 U/L  14       Scheduled Meds:  Chlorhexidine Gluconate Cloth  6 each Topical Daily   diltiazem  30 mg Per Tube Q6H   dorzolamide-timolol  1 drop Both Eyes BID   insulin aspart  0-9 Units Subcutaneous Q4H   latanoprost  1 drop Both Eyes QHS   metoCLOPramide (REGLAN) injection  10 mg Intravenous Q8H   pantoprazole (PROTONIX) IV  40 mg Intravenous BID AC   rivaroxaban  20 mg Per Tube Daily   Continuous Infusions:  dextrose 5 % and 0.9 % NaCl 75 mL/hr at 07/23/23 1658   famotidine (PEPCID) IV Stopped (07/22/23 2240)    Time 40  Rhetta Mura, MD  [facility]

## 2023-07-23 NOTE — Progress Notes (Signed)
Pharmacy Consult: TPN  Pharmacy consulted to manage TPN for prolonged ileus.  Orders received after cutoff time. RD goals noted. Pt has PAC for chemo - unclear if this will be used for TPN or if new access will be placed.   Plan: TPN labs ordered for 9/25 TPN to start 9/26 @ 1800  Herby Abraham, [place].D Use secure chat for questions 07/23/2023 6:16 PM

## 2023-07-24 DIAGNOSIS — K311 Adult hypertrophic pyloric stenosis: Secondary | ICD-10-CM | POA: Diagnosis not present

## 2023-07-24 LAB — COMPREHENSIVE METABOLIC PANEL
ALT: 12 U/L (ref 0–44)
AST: 12 U/L — ABNORMAL LOW (ref 15–41)
Albumin: 2.8 g/dL — ABNORMAL LOW (ref 3.5–5.0)
Alkaline Phosphatase: 98 U/L (ref 38–126)
Anion gap: 7 (ref 5–15)
BUN: 10 mg/dL (ref 8–23)
CO2: 23 mmol/L (ref 22–32)
Calcium: 8.2 mg/dL — ABNORMAL LOW (ref 8.9–10.3)
Chloride: 106 mmol/L (ref 98–111)
Creatinine, Ser: 0.8 mg/dL (ref 0.44–1.00)
GFR, Estimated: >60 mL/min (ref >60)
Glucose, Bld: 147 mg/dL — ABNORMAL HIGH (ref 70–99)
Potassium: 3.2 mmol/L — ABNORMAL LOW (ref 3.5–5.1)
Sodium: 136 mmol/L (ref 135–145)
Total Bilirubin: 0.6 mg/dL (ref 0.3–1.2)
Total Protein: 5.7 g/dL — ABNORMAL LOW (ref 6.5–8.1)

## 2023-07-24 LAB — PHOSPHORUS: Phosphorus: 3.4 mg/dL (ref 2.5–4.6)

## 2023-07-24 LAB — MAGNESIUM: Magnesium: 1.5 mg/dL — ABNORMAL LOW (ref 1.7–2.4)

## 2023-07-24 LAB — GLUCOSE, CAPILLARY
Glucose-Capillary: 101 mg/dL — ABNORMAL HIGH (ref 70–99)
Glucose-Capillary: 112 mg/dL — ABNORMAL HIGH (ref 70–99)
Glucose-Capillary: 118 mg/dL — ABNORMAL HIGH (ref 70–99)
Glucose-Capillary: 119 mg/dL — ABNORMAL HIGH (ref 70–99)
Glucose-Capillary: 130 mg/dL — ABNORMAL HIGH (ref 70–99)
Glucose-Capillary: 141 mg/dL — ABNORMAL HIGH (ref 70–99)

## 2023-07-24 MED ORDER — TRAVASOL 10 % IV SOLN
INTRAVENOUS | Status: AC
  Filled 2023-07-24: qty 480

## 2023-07-24 MED ORDER — INSULIN ASPART 100 UNIT/ML IJ SOLN
0.0000 [IU] | INTRAMUSCULAR | Status: DC
  Administered 2023-07-24 – 2023-07-26 (×6): 1 [IU] via SUBCUTANEOUS
  Administered 2023-07-26: 2 [IU] via SUBCUTANEOUS
  Administered 2023-07-26: 1 [IU] via SUBCUTANEOUS
  Administered 2023-07-27 (×2): 2 [IU] via SUBCUTANEOUS
  Administered 2023-07-27 (×2): 1 [IU] via SUBCUTANEOUS
  Administered 2023-07-28 (×2): 2 [IU] via SUBCUTANEOUS
  Administered 2023-07-28: 1 [IU] via SUBCUTANEOUS
  Administered 2023-07-28 (×3): 2 [IU] via SUBCUTANEOUS
  Administered 2023-07-29: 1 [IU] via SUBCUTANEOUS
  Administered 2023-07-29 – 2023-07-30 (×6): 2 [IU] via SUBCUTANEOUS
  Administered 2023-07-30: 1 [IU] via SUBCUTANEOUS
  Administered 2023-07-30 (×3): 2 [IU] via SUBCUTANEOUS
  Administered 2023-07-30: 1 [IU] via SUBCUTANEOUS
  Administered 2023-07-31: 2 [IU] via SUBCUTANEOUS
  Administered 2023-07-31 (×2): 1 [IU] via SUBCUTANEOUS
  Administered 2023-07-31 (×4): 2 [IU] via SUBCUTANEOUS
  Administered 2023-08-01 (×3): 1 [IU] via SUBCUTANEOUS
  Administered 2023-08-01: 2 [IU] via SUBCUTANEOUS
  Administered 2023-08-01: 1 [IU] via SUBCUTANEOUS
  Administered 2023-08-02: 2 [IU] via SUBCUTANEOUS
  Administered 2023-08-02: 1 [IU] via SUBCUTANEOUS
  Administered 2023-08-02: 2 [IU] via SUBCUTANEOUS
  Administered 2023-08-02 – 2023-08-03 (×4): 1 [IU] via SUBCUTANEOUS
  Administered 2023-08-03 (×2): 2 [IU] via SUBCUTANEOUS
  Administered 2023-08-03: 4 [IU] via SUBCUTANEOUS
  Administered 2023-08-03 (×2): 2 [IU] via SUBCUTANEOUS
  Administered 2023-08-04 (×4): 1 [IU] via SUBCUTANEOUS
  Administered 2023-08-04: 2 [IU] via SUBCUTANEOUS
  Administered 2023-08-05 (×3): 1 [IU] via SUBCUTANEOUS
  Administered 2023-08-05: 2 [IU] via SUBCUTANEOUS
  Administered 2023-08-05: 1 [IU] via SUBCUTANEOUS
  Administered 2023-08-06: 2 [IU] via SUBCUTANEOUS
  Administered 2023-08-06: 1 [IU] via SUBCUTANEOUS
  Administered 2023-08-06: 2 [IU] via SUBCUTANEOUS
  Administered 2023-08-06 – 2023-08-07 (×4): 1 [IU] via SUBCUTANEOUS
  Administered 2023-08-07: 2 [IU] via SUBCUTANEOUS
  Administered 2023-08-08 (×2): 1 [IU] via SUBCUTANEOUS

## 2023-07-24 MED ORDER — SODIUM CHLORIDE 0.9% FLUSH
10.0000 mL | INTRAVENOUS | Status: DC | PRN
  Administered 2023-08-01 – 2023-08-17 (×5): 10 mL
  Administered 2023-08-17: 20 mL

## 2023-07-24 MED ORDER — METOCLOPRAMIDE HCL 5 MG/ML IJ SOLN
10.0000 mg | Freq: Three times a day (TID) | INTRAMUSCULAR | Status: AC
  Administered 2023-07-24 – 2023-07-27 (×9): 10 mg via INTRAVENOUS
  Filled 2023-07-24 (×9): qty 2

## 2023-07-24 MED ORDER — SODIUM CHLORIDE 0.9% FLUSH
10.0000 mL | Freq: Two times a day (BID) | INTRAVENOUS | Status: DC
  Administered 2023-07-25: 10 mL
  Administered 2023-07-26: 30 mL
  Administered 2023-07-28 – 2023-08-16 (×28): 10 mL
  Administered 2023-08-17: 30 mL
  Administered 2023-08-17: 10 mL
  Administered 2023-08-18: 30 mL
  Administered 2023-08-18: 10 mL
  Administered 2023-08-19: 30 mL
  Administered 2023-08-19 – 2023-08-21 (×4): 10 mL
  Administered 2023-08-22: 20 mL
  Administered 2023-08-22 – 2023-08-23 (×2): 10 mL

## 2023-07-24 MED ORDER — POTASSIUM CHLORIDE 2 MEQ/ML IV SOLN
INTRAVENOUS | Status: AC
  Filled 2023-07-24: qty 1000

## 2023-07-24 MED ORDER — MAGNESIUM SULFATE 2 GM/50ML IV SOLN
2.0000 g | Freq: Once | INTRAVENOUS | Status: AC
  Administered 2023-07-24: 2 g via INTRAVENOUS
  Filled 2023-07-24: qty 50

## 2023-07-24 MED ORDER — THIAMINE HCL 100 MG/ML IJ SOLN
100.0000 mg | INTRAMUSCULAR | Status: AC
  Administered 2023-07-24 – 2023-07-28 (×5): 100 mg via INTRAVENOUS
  Filled 2023-07-24 (×5): qty 2

## 2023-07-24 NOTE — Progress Notes (Signed)
HOSPITALIST ROUNDING NOTE Sherri Gill MWU:[id number]  DOB: April 09, 1951  DOA: July 16, 2023  PCP: [name], MD  07/24/2023,8:56 AM   LOS: 10 days      Code Status: Full    From:      Current Dispo:      72 year old black female Prior HSV meningitis in 2015 DM TY 2 HTN HLD IDA Paroxysmal A-fib on Xarelto Metastatic serous carcinoma of uterus status post omental BH TSO Dr. [name] Seen several times over the past 2 weeks for poor p.o. intake with constant nausea vomiting + constipation and straining despite MiraLAX senna  Seen in ED 07/12/2023 for symptoms CT ABD = gastric wall thickening distal duodenal wall thickening and junctional wall thickening with stomach and proximal duodenal distention?  Residual peritoneal tumor-patient was able to tolerate food and was discharged home  Re-presented 07/12/2023 to the med center at [facility] Pulse 111 Tmax 97 WBC 10.6 BUN/creatinine 24/1.2 acute abdominal x-rays negative-NG tube was placed secondary to possible malignant stricture of proximal small bowel GI Dr. [name] consulted 2/2?  Need upper endoscopy patient kept n.p.o. . 9/18 EGD-patent esophagus --push enteroscopy ruled out gastric outlet obstruction with thickened gastric folds 9/20 upper GI series KUB contrast progressing to rectum--- despite this, starting Reglan still nauseous 9/21 [facility] signed off 9/24 NG tube reinserted 9/25 resumption chemo paclitaxel carboplatin  Plan  Carcinomatosis causing delayed gastric motility -small bowel obstruction ruled out as above with endoscopy, upper GI series NG tube clamped today allow sips with clears Continue Reglan 10 Q8, Compazine 5 every 6 as needed refractory nausea, Pepcid 20 daily at bedtime Zofran 4 every 6 as needed and pantoprazole 40 twice daily Requires TPN at this time until p.o. intake can be confirmed  Metastatic serous carcinoma of uterus Resumed 9/25 Rx carboplatin paclitaxel-rest as per Dr.  Tanya Nones  Paroxysmal A-fib CHADVASC >4 on Xarelto continue Xarelto orally Continue Cardizem 30 every 6 with metoprolol IV for heart rate above 120  HTN holding hydralazine, losartan, atorvastatin etc. etc.  DM TY 2 now n.p.o. A1c recently 6.4 Hold metformin 1000 twice daily  continue D5 NS 75 cc/H CBGs trending only 120s to 150s so only sliding scale coverage  Hypothyroid Synthroid on hold since admission-May require replacement IV-dose 25 mcg  AKI, hypokalemia-treated-periodic labs Replace and fluids with D5 40K 75 cc/H, give 2 g of IV mag now-a.m. labs  Normocytic anemia query B12 deficiency Trend as needed  DVT prophylaxis: Xarelto p.o.  Status is: Inpatient Remains inpatient appropriate because:   Requires resolution of issues    Subjective:  She is about the same maybe a little less bloated-no flatus or stool yet NG output about 2000 dark liquid   Objective + exam Vitals:   07/23/23 0422 07/23/23 1411 07/23/23 2346 07/24/23 0525  BP: (!) 153/65 (!) 153/73 (!) 123/51 (!) 147/66  Pulse: 62 64 65 (!) 58  Resp: 18 18 15 18   Temp: 97.7 F (36.5 C) 98.3 F (36.8 C) 98.2 F (36.8 C) 98 F (36.7 C)  TempSrc: Oral Oral Oral Oral  SpO2: 97% 93% 98% 100%  Weight: 82.2 kg     Height:       Filed Weights   07/02/2023 0410 07/22/2023 1300 07/23/23 0422  Weight: 80.9 kg 80.9 kg 82.2 kg    Examination:  Awake coherent no distress NG tube in place Chest clear no wheeze rales rhonchi S1-S2 sinus rhythm Abdomen softer and not as bloated Moving 4 limbs equally no focal  deficit Psych euthymic coherent  Data Reviewed: reviewed   CBC    Component Value Date/Time   WBC 6.9 07/23/2023 0500   RBC 2.97 (L) 07/23/2023 0500   HGB 8.1 (L) 07/23/2023 0500   HGB 10.1 (L) 07/10/2023 1331   HGB 9.8 (L) 03/25/2023 1541   HCT 26.2 (L) 07/23/2023 0500   HCT 31.5 (L) 03/25/2023 1541   PLT 253 07/23/2023 0500   PLT 384 07/10/2023 1331   PLT 237 03/25/2023 1541   MCV 88.2  07/23/2023 0500   MCV 84 03/25/2023 1541   MCH 27.3 07/23/2023 0500   MCHC 30.9 07/23/2023 0500   RDW 15.6 (H) 07/23/2023 0500   RDW 17.0 (H) 03/25/2023 1541   LYMPHSABS 0.9 07/23/2023 0500   LYMPHSABS 2.8 05/31/2021 1531   MONOABS 0.3 07/23/2023 0500   EOSABS 0.0 07/23/2023 0500   EOSABS 0.3 05/31/2021 1531   BASOSABS 0.0 07/23/2023 0500   BASOSABS 0.0 05/31/2021 1531      Latest Ref Rng & Units 07/24/2023    5:00 AM 07/23/2023    8:43 PM 07/23/2023    5:00 AM  CMP  Glucose 70 - 99 mg/dL 725  366  440   BUN 8 - 23 mg/dL 10  10  9    Creatinine 0.44 - 1.00 mg/dL 3.47  4.25  9.56   Sodium 135 - 145 mmol/L 136  137  135   Potassium 3.5 - 5.1 mmol/L 3.2  3.1  3.7   Chloride 98 - 111 mmol/L 106  107  106   CO2 22 - 32 mmol/L 23  21  24    Calcium 8.9 - 10.3 mg/dL 8.2  8.6  8.7   Total Protein 6.5 - 8.1 g/dL 5.7  5.9    Total Bilirubin 0.3 - 1.2 mg/dL 0.6  0.8    Alkaline Phos 38 - 126 U/L 98  103    AST 15 - 41 U/L 12  11    ALT 0 - 44 U/L 12  13       Scheduled Meds:  Chlorhexidine Gluconate Cloth  6 each Topical Daily   diltiazem  30 mg Per Tube Q6H   dorzolamide-timolol  1 drop Both Eyes BID   insulin aspart  0-9 Units Subcutaneous Q4H   latanoprost  1 drop Both Eyes QHS   levothyroxine  37.5 mcg Intravenous Daily   metoCLOPramide (REGLAN) injection  10 mg Intravenous Q8H   pantoprazole (PROTONIX) IV  40 mg Intravenous BID AC   rivaroxaban  20 mg Per Tube Daily   Continuous Infusions:  dextrose 5 % 1,000 mL with potassium chloride 40 mEq infusion 75 mL/hr at 07/24/23 0851   famotidine (PEPCID) IV 20 mg (07/23/23 2204)    Time 40  Rhetta Mura, MD  [facility]

## 2023-07-24 NOTE — Progress Notes (Signed)
Sherri Gill   DOB:1951-08-12   UJ#:[id number]    ASSESSMENT & PLAN:  Recurrent uterine cancer She presents with partial small bowel obstruction Appreciate GI consult Already feeling better with NG tube decompression and IV fluids I reviewed the operative note from her EGD and biopsy results Biopsy is taken but no signs of malignant obstruction, negative for malignancy Further imaging studies show no definitive signs of obstruction but possibly interference with peristalsis from peritoneal disease At current state, she is not able to maintain nutritional state as she has chronic nausea vomiting   We discussed risk, benefits, side effects of chemotherapy I recommend resumption of chemotherapy with carboplatin and paclitaxel which she tolerated well in the past She tolerated chemotherapy with carboplatin and paclitaxel 07/23/2023 In the meantime, continue supportive care   Nausea, vomiting and dehydration Feeling better after NG tube decompression Will clamp NG tube for next 24 to 48 hours and see what happens Will allow some ice chips and sips of water   Anemia chronic disease She is not symptomatic Observe only   Dehydration, mild hypokalemia and other electrolyte imbalance, severe malnutrition Anticipate prolonged ileus and the patient is already malnourished Agree with TPN   Discharge planning Unknown All questions were answered. The patient knows to call the clinic with any problems, questions or concerns.   The total time spent in the appointment was 40 minutes encounter with patients including review of chart and various tests results, discussions about plan of care and coordination of care plan  Artis Delay, MD 07/24/2023 7:44 AM  Subjective:  She tolerated chemo well.  She had intermittent minimum nausea but no vomiting.  She already feeling much better after NG tube decompression.  We discussed plan of care including clamping of NG tube She continues to have mild  intermittent passage of flatus  Objective:  Vitals:   07/23/23 2346 07/24/23 0525  BP: (!) 123/51 (!) 147/66  Pulse: 65 (!) 58  Resp: 15 18  Temp: 98.2 F (36.8 C) 98 F (36.7 C)  SpO2: 98% 100%     Intake/Output Summary (Last 24 hours) at 07/24/2023 0744 Last data filed at 07/24/2023 0600 Gross per 24 hour  Intake 1443.24 ml  Output 2300 ml  Net -856.76 ml    GENERAL:alert, no distress and comfortable    Labs:  Recent Labs    07/22/23 0600 07/23/23 0500 07/23/23 2043 07/24/23 0500  NA 137 135 137 136  K 3.4* 3.7 3.1* 3.2*  CL 106 106 107 106  CO2 26 24 21* 23  GLUCOSE 132* 147* 164* 147*  BUN 8 9 10 10   CREATININE 0.87 0.78 0.67 0.80  CALCIUM 9.0 8.7* 8.6* 8.2*  GFRNONAA >60 >60 >60 >60  PROT 6.1*  --  5.9* 5.7*  ALBUMIN 3.1*  --  2.9* 2.8*  AST 14*  --  11* 12*  ALT 14  --  13 12  ALKPHOS 116  --  103 98  BILITOT 0.4  --  0.8 0.6    Studies:  DG Abd 1 View  Result Date: 07/22/2023 CLINICAL DATA:  [id number] Encounter for nasogastric (NG) tube placement [id number]. EXAM: ABDOMEN - 1 VIEW COMPARISON:  Abdominal radiograph 07/19/2023. FINDINGS: Right chest Port-A-Cath tip projects over the right atrium. Enteric tube tip and side port project over the stomach. Scant retained contrast in visualized portions of the colon. IMPRESSION: Enteric tube tip and side port project over the stomach. Electronically Signed   By: Elwyn Reach.D.  On: 07/22/2023 14:27   DG Abd 1 View  Result Date: 07/19/2023 CLINICAL DATA:  [id number] SBO (small bowel obstruction) (HCC) [id number] EXAM: ABDOMEN - 1 VIEW COMPARISON:  July 18, 2023 FINDINGS: Enteric contrast has progressed to the rectum. Multiple colonic diverticuli. No dilated loops of bowel are seen. IMPRESSION: Enteric contrast has progressed to the rectum. Electronically Signed   By: Meda Klinefelter M.D.   On: 07/19/2023 10:22   DG UGI W SINGLE CM (SOL OR THIN BA)  Result Date: 07/18/2023 CLINICAL DATA:  Endometrial/uterine  cancer. Fold thickening in the stomach on recent endoscopy. Reflux and bloating. Difficulty keeping foods and liquids down. EXAM: UPPER GI SERIES WITH KUB TECHNIQUE: After obtaining a scout radiograph a routine upper GI series was performed using thin barium. Mobility was mildly limited on today's exam, for example the patient was unable to turn prone. FLUOROSCOPY: Radiation Exposure Index (as provided by the fluoroscopic device): 21.7 mGy Kerma COMPARISON:  CT abdomen 07/12/2023 FINDINGS: Initial KUB unremarkable aside from mild levoconvex lumbar scoliosis. The pharyngeal phase of swallowing appears normal. Primary peristaltic waves were intact on 4/4 swallows. Small type 1 hiatal hernia. Occasional gastroesophageal reflux was observed on today's exam. I used mucosal relief and balloon compression of the stomach in order to help mitigate the use of thin barium and single contrast technique as well as the patient is inability to perform prone imaging. These demonstrate a gastric fold thickening as shown on prior CT and prior endoscopy. The duodenum is mildly dilated, especially proximally. Eventually this proceeded on to the fourth portion of the duodenum as shown on image 1 series 15. However, the contrast column was sluggish to flow through the duodenum, and given the somewhat dilated appearance, the possibility of obstruction distally is not excluded. Consider following the barium column with serial KUB use in assessing for small bowel obstruction in several hours. A 13 mm barium tablet passed briskly into the stomach. Port-A-Cath noted. IMPRESSION: 1. Gastric fold thickening as shown on prior CT and endoscopy. 2. Small type 1 hiatal hernia. 3. Intermittent gastroesophageal reflux. 4. Mildly dilated duodenum, with sluggish flow of contrast through the duodenum. The possibility of a more distal small bowel obstruction is not excluded given this appearance. Consider KUB follow-up in several hours to assess for  transit of the barium column through the small bowel in assessing for small bowel obstruction. Electronically Signed   By: Gaylyn Rong M.D.   On: 07/18/2023 12:00   DG Abd Portable 1 View  Result Date: 07/27/2023 CLINICAL DATA:  Nasogastric tube placement. EXAM: PORTABLE ABDOMEN - 1 VIEW COMPARISON:  Abdomen and pelvis CT dated 07/12/2023. FINDINGS: Normal bowel-gas pattern. Interval nasogastric tube with its tip and side hole in the proximal to mid stomach. Lower thoracic spine degenerative changes. IMPRESSION: Nasogastric tube tip and side hole in the proximal to mid stomach. Electronically Signed   By: Beckie Salts M.D.   On: 07/12/2023 17:09   DG Abdomen Acute W/Chest  Result Date: 07/19/2023 CLINICAL DATA:  Vomiting EXAM: DG ABDOMEN ACUTE WITH 1 VIEW CHEST COMPARISON:  07/10/2023. FINDINGS: There is no evidence of dilated bowel loops or free intraperitoneal air. No radiopaque calculi or other significant radiographic abnormality is seen. Heart size and mediastinal contours are within normal limits. Both lungs are clear. There are thoracic degenerative changes. Right-sided Port-A-Cath tip overlies distal SVC. IMPRESSION: Negative abdominal radiographs.  No acute cardiopulmonary disease. Electronically Signed   By: Layla Maw M.D.   On: 07/17/2023 14:40  CT ABDOMEN PELVIS W CONTRAST  Result Date: 07/12/2023 CLINICAL DATA:  Endometrial/uterine cancer.  Nausea and vomiting. * Tracking Code: BO * EXAM: CT ABDOMEN AND PELVIS WITH CONTRAST TECHNIQUE: Multidetector CT imaging of the abdomen and pelvis was performed using the standard protocol following bolus administration of intravenous contrast. RADIATION DOSE REDUCTION: This exam was performed according to the departmental dose-optimization program which includes automated exposure control, adjustment of the mA and/or kV according to patient size and/or use of iterative reconstruction technique. CONTRAST:  OMNIPAQUE IOHEXOL 300 MG/ML   SOLN COMPARISON:  CT abdomen/pelvis from 06/17/2023 FINDINGS: Lower chest: A catheter terminates in the right atrium on the top most image. Mild cardiomegaly. Hepatobiliary: Unremarkable Pancreas: Unremarkable Spleen: Unremarkable Adrenals/Urinary Tract: Unremarkable Stomach/Bowel: Diffuse gastric wall thickening with mild gastric and substantial proximal duodenal distension. After crossing the midline, the duodenum does not appear distended but there is substantial distal duodenal and jejunal wall thickening, worsened from prior exams. Scattered colonic diverticula. Appendix unremarkable. No pneumatosis. No hypoenhancement of bowel wall. There are abnormal contains small fluid collections along the small bowel mesentery, for example along the left abdomen on image 64 series 7 and along the right abdomen on image 55 series 7. In addition there is fluid density in the lesser sac on image 76 series 7. Vascular/Lymphatic: Mild atheromatous vascular disease of the aortoiliac tree. Patent celiac trunk and SMA. Patent superior mesenteric vein. Suspected left iliac adenopathy measuring about 1.3 cm in diameter on image 47 series 2, stable. This is of similar density to the adjacent vein. Reproductive: Uterus absent. Nodular enhancement along the left vaginal cuff measuring 2.5 by 1.7 cm, previously 2.3 by 1.7 cm by my measurements. Tumor in this location is not excluded. Other: There is some abnormal stranding along the remaining omentum for example on image 73 series 9. Small nodular deposit along the upper omentum on image 31 series 2. Musculoskeletal: Lower thoracic and lumbar spondylosis. Density within the umbilicus on image 102 series 9, potentially a small knuckle of bowel versus tumor. IMPRESSION: 1. Worsening of gastric wall thickening, distal duodenal wall thickening, and jejunal wall thickening. The stomach and proximal duodenum are distended. The appearance favors gastroenteritis. 2. There is also some abnormal  stranding along the remaining omentum. There are also abnormal fluid collections along the small bowel mesentery and in the lesser sac. The appearance raises concern for possible residual peritoneal tumor causing fluid deposits. Small nodular deposit along the upper omentum along with nodularity in the umbilicus. 3. Nodular enhancement along the left vaginal cuff measuring 2.5 by 1.7 cm, previously 2.3 by 1.7 cm by my measurements. This likely represents tumor. 4. Suspected left iliac adenopathy measuring about 1.3 cm in diameter, stable. 5. Mild cardiomegaly. 6. Scattered colonic diverticula. 7. Mild atheromatous vascular disease of the aortoiliac tree. 8. Lower thoracic and lumbar spondylosis. Electronically Signed   By: Gaylyn Rong M.D.   On: 07/12/2023 14:56   DG Abd 1 View  Result Date: 07/10/2023 CLINICAL DATA:  Abdominal pain, vomiting. History of uterine cancer. EXAM: ABDOMEN - 1 VIEW COMPARISON:  April 09, 2022. FINDINGS: The bowel gas pattern is normal. Surgical clip is seen over the left sacrum. No radio-opaque calculi or other significant radiographic abnormality are seen. IMPRESSION: No abnormal bowel dilatation. Electronically Signed   By: Lupita Raider M.D.   On: 07/10/2023 14:18

## 2023-07-24 NOTE — Progress Notes (Signed)
Mobility Specialist - Progress Note   07/24/23 1106  Mobility  Activity Ambulated with assistance in hallway  Level of Assistance Modified independent, requires aide device or extra time  Assistive Device Other (Comment) (IV Pole)  Distance Ambulated (ft) 1000 ft  Activity Response Tolerated well  Mobility Referral Yes  $Mobility charge 1 Mobility  Mobility Specialist Start Time (ACUTE ONLY) 1047  Mobility Specialist Stop Time (ACUTE ONLY) 1105  Mobility Specialist Time Calculation (min) (ACUTE ONLY) 18 min   Pt received in recliner and agreeable to mobility. No complaints during session. Pt to recliner after session with all needs met.    [facility]

## 2023-07-24 NOTE — Progress Notes (Signed)
Nutrition Follow-up  DOCUMENTATION CODES:   Non-severe (moderate) malnutrition in context of chronic illness  INTERVENTION:   Monitor magnesium, potassium, and phosphorus for at least 3 days, MD to replete as needed, as pt is at risk for refeeding syndrome.  -TPN management per Pharmacy -Continue 100 mg Thiamine daily x 5 days  -Daily weights while on TPN  NUTRITION DIAGNOSIS:   Moderate Malnutrition related to chronic illness, cancer and cancer related treatments as evidenced by mild muscle depletion, energy intake < or equal to 75% for > or equal to 1 month.  Ongoing.  GOAL:   Patient will meet greater than or equal to 90% of their needs  Not meeting yet.  MONITOR:   Labs, Weight trends, I & O's, TPN  REASON FOR ASSESSMENT:   Consult New TPN/TNA  ASSESSMENT:   72 y.o. 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  9/16: admitted, NPO, NGT placed 9/18: NGT removed, CLD ->Regular diet ->CLD 9/19: Soft diet ->FLD 9/23: Soft diet 9/24: NPO, NGT replaced  Patient in room, on the phone, asked RD to come back later. Will check with patient at later time.  NGT was clamped.   TPN to start at 40 ml/hr, providing 931 kcals and 48g protein.  Admission weight: 178 lbs Current weight: 181 lbs  Medications: Thiamine, D5 infusion, Pepcid, IV Mg sulfate  Labs reviewed: CBGs: 101-150 Low K Low Mg   Diet Order:   Diet Order             Diet NPO time specified Except for: Ice Chips, Sips with Meds  Diet effective now                   EDUCATION NEEDS:   No education needs have been identified at this time  Skin:  Skin Assessment: Reviewed RN Assessment  Last BM:   9/24 -type 6  Height:   Ht Readings from Last 1 Encounters:  07/16/23 5\' 3"  (1.6 m)    Weight:   Wt Readings from Last 1 Encounters:  07/23/23 82.2 kg    BMI:  Body mass index is 32.1 kg/m.  Estimated Nutritional Needs:   Kcal:  1650-1850  Protein:  85-100g  Fluid:  1.8L/day   Tilda Franco, MS, RD, LDN Inpatient Clinical Dietitian Contact information available via Amion

## 2023-07-24 NOTE — Progress Notes (Signed)
NG tube clamped at this time per MD order.

## 2023-07-24 NOTE — Progress Notes (Addendum)
PHARMACY - TOTAL PARENTERAL NUTRITION CONSULT NOTE   Indication: Prolonged ileus  Patient Measurements: Height: 5\' 3"  (160 cm) Weight: 82.2 kg (181 lb 3.5 oz) IBW/kg (Calculated) : 52.4 TPN AdjBW (KG): 59.5 Body mass index is 32.1 kg/m.  Assessment:  [name] admitted 9/16 for nausea/vomiting x2.5 weeks. Patient has a history of recurrent uterine cancer--Biopsy taken during 9/18 small bowel enteroscopy but no signs of malignant obstruction, negative for malignancy. Oncology recommended resumption of chemotherapy during this admission--carboplatin and paclitaxel completed 9/25. Patient has severe malnutrition given inability to keep food down with continued N/V during this admission and PTA. Pharmacy consulted for TPN management.   Glucose / Insulin: Hx of T2DM - CBGs 119-164 in last 24 hrs (5 units of SSI used) - Dexamethasone 10mg  x1 given 9/25 - Patient currently on D5/K8mEq infusion @75mL /hr Electrolytes:  - K 3.2 (low) - CO2 23 (lower end of normal range) - Mg 1.5 (low) - CoCa 9.2 (WNL) - All other lytes WNL Renal:  - Scr <1 - BUN WNL Hepatic:  - LFTs, Tbili, Alk phos all WNL - Albumin 2.8 (low) Intake / Output; MIVF:  - UOP: -300 mL/24 hrs - Emesis/NG output: -2000 mL/24 hrs - Patient currently on D5/K29mEq infusion @75mL /hr - Clamping NG tube next 24-48 hrs to see how patient tolerates GI Imaging: - 9/20 DG UGI: Gastric fold thickening as shown on prior CT and endoscopy. Mildly dilated duodenum, with sluggish flow of contrast through the duodenum--possibility of a more distal SBO is not excluded given this appearance. GI Surgeries / Procedures:  - 9/18: small bowel endoscopy: Normal appearing, widely patent esophagus and GEJ. Edematous gastric folds-biopsied. A single duodenal polyp in the post-bulbar duodenum-biopsied  Central access: port vs PICC (no PICC currently) TPN start date: pending 9/26  Nutritional Goals: Goal TPN rate is 75 mL/hr (provides 90 g of  protein and 1746 kcals per day)  RD Assessment: Estimated Needs Total Energy Estimated Needs: 1650-1850 Total Protein Estimated Needs: 85-100g Total Fluid Estimated Needs: 1.8L/day  Current Nutrition:  NPO Ice chips and sips with meds  Plan:  - Lytes: D5/K39mEq infusion started 9/26 AM Magnesium 2g IV given 9/26 AM  - Start TPN at 11mL/hr at 1800 (provides 48 g/day of protein and 932.6 total kcal/day) - Electrolytes in TPN:  Na 63mEq/L K 47mEq/L  Ca 49mEq/L Mg 95mEq/L Phos 68mmol/L Cl:Ac 1:1 - Add standard MVI and trace elements to TPN - Initiate Sensitive q4h SSI and adjust as needed  - Reduce MIVF to KVO at 1800 per Dr. Mahala Menghini - Give thiamine 100mg  daily x5 days - Monitor TPN labs on Mon/Thurs, and daily thru 9/29   Cherylin Mylar, PharmD Clinical Pharmacist  9/26/202410:46 AM

## 2023-07-25 DIAGNOSIS — K311 Adult hypertrophic pyloric stenosis: Secondary | ICD-10-CM | POA: Diagnosis not present

## 2023-07-25 LAB — GLUCOSE, CAPILLARY
Glucose-Capillary: 112 mg/dL — ABNORMAL HIGH (ref 70–99)
Glucose-Capillary: 115 mg/dL — ABNORMAL HIGH (ref 70–99)
Glucose-Capillary: 118 mg/dL — ABNORMAL HIGH (ref 70–99)
Glucose-Capillary: 122 mg/dL — ABNORMAL HIGH (ref 70–99)
Glucose-Capillary: 126 mg/dL — ABNORMAL HIGH (ref 70–99)
Glucose-Capillary: 127 mg/dL — ABNORMAL HIGH (ref 70–99)

## 2023-07-25 LAB — COMPREHENSIVE METABOLIC PANEL
ALT: 12 U/L (ref 0–44)
AST: 12 U/L — ABNORMAL LOW (ref 15–41)
Albumin: 3 g/dL — ABNORMAL LOW (ref 3.5–5.0)
Alkaline Phosphatase: 100 U/L (ref 38–126)
Anion gap: 7 (ref 5–15)
BUN: 11 mg/dL (ref 8–23)
CO2: 24 mmol/L (ref 22–32)
Calcium: 8.4 mg/dL — ABNORMAL LOW (ref 8.9–10.3)
Chloride: 104 mmol/L (ref 98–111)
Creatinine, Ser: 0.88 mg/dL (ref 0.44–1.00)
GFR, Estimated: >60 mL/min (ref >60)
Glucose, Bld: 124 mg/dL — ABNORMAL HIGH (ref 70–99)
Potassium: 2.8 mmol/L — ABNORMAL LOW (ref 3.5–5.1)
Sodium: 135 mmol/L (ref 135–145)
Total Bilirubin: 0.4 mg/dL (ref 0.3–1.2)
Total Protein: 5.8 g/dL — ABNORMAL LOW (ref 6.5–8.1)

## 2023-07-25 LAB — PHOSPHORUS: Phosphorus: 2.5 mg/dL (ref 2.5–4.6)

## 2023-07-25 LAB — MAGNESIUM: Magnesium: 1.7 mg/dL (ref 1.7–2.4)

## 2023-07-25 MED ORDER — POTASSIUM CHLORIDE 10 MEQ/50ML IV SOLN
10.0000 meq | INTRAVENOUS | Status: DC
  Filled 2023-07-25 (×5): qty 50

## 2023-07-25 MED ORDER — POTASSIUM CHLORIDE 10 MEQ/50ML IV SOLN
10.0000 meq | INTRAVENOUS | Status: DC
  Administered 2023-07-25 (×2): 10 meq via INTRAVENOUS
  Filled 2023-07-25 (×4): qty 50

## 2023-07-25 MED ORDER — TRAZODONE HCL 50 MG PO TABS
50.0000 mg | ORAL_TABLET | Freq: Every evening | ORAL | Status: DC | PRN
  Administered 2023-07-25: 50 mg via ORAL
  Filled 2023-07-25: qty 1

## 2023-07-25 MED ORDER — METOPROLOL SUCCINATE ER 25 MG PO TB24
12.5000 mg | ORAL_TABLET | Freq: Every day | ORAL | Status: DC
  Administered 2023-07-25 – 2023-08-07 (×14): 12.5 mg via ORAL
  Filled 2023-07-25 (×15): qty 1

## 2023-07-25 MED ORDER — TRAVASOL 10 % IV SOLN
INTRAVENOUS | Status: AC
  Filled 2023-07-25: qty 480

## 2023-07-25 MED ORDER — POTASSIUM CHLORIDE 10 MEQ/50ML IV SOLN
10.0000 meq | INTRAVENOUS | Status: AC
  Administered 2023-07-25 (×4): 10 meq via INTRAVENOUS
  Filled 2023-07-25 (×4): qty 50

## 2023-07-25 MED ORDER — POTASSIUM CHLORIDE CRYS ER 20 MEQ PO TBCR
20.0000 meq | EXTENDED_RELEASE_TABLET | ORAL | Status: DC
  Administered 2023-07-25: 20 meq via ORAL
  Filled 2023-07-25: qty 1

## 2023-07-25 NOTE — Progress Notes (Signed)
PHARMACY - TOTAL PARENTERAL NUTRITION CONSULT NOTE   Indication: Prolonged ileus  Patient Measurements: Height: 5\' 3"  (160 cm) Weight: 82.2 kg (181 lb 3.5 oz) IBW/kg (Calculated) : 52.4 TPN AdjBW (KG): 59.5 Body mass index is 32.1 kg/m.  Assessment:  [name] admitted 9/16 for nausea/vomiting x 2.5 weeks. Patient with h/o recurrent uterine cancer--Bx taken during 9/18 small bowel enteroscopy but no signs of malignant obstruction, negative for malignancy. Oncology recommended resumption of chemotherapy during this admission--carboplatin and paclitaxel completed 9/25.   Patient has severe malnutrition given inability to keep food down with continued N/V during this admission and PTA. Pharmacy consulted for TPN management.  - Carcinomatosis causing delayed gastric motility   Glucose / Insulin: Hx of T2DM - CBGs 101-119 in last 24 hrs (1 unit of SSI used) - Dexamethasone 10mg  x1 given 9/25  Electrolytes:  - K 2.8 lower - Mg 1.7 - CoCa 9.2 (WNL) - All other lytes WNL  Renal:  - Scr <1 - BUN WNL  Hepatic:  - LFTs, Tbili, Alk phos all WNL - Albumin 3  Intake / Output; MIVF:  - UOP: 0/24 hrs - Emesis/NG output: None documented 9/26 - Clamping NG tube next 24-48 hrs to see how patient tolerates - LBM 9/24  GI Meds: IV Famotidine/hs, IV Reglan/8hr, IV PPI BID,   GI Imaging: - 9/20 DG UGI: Gastric fold thickening as shown on prior CT and endoscopy. Mildly dilated duodenum, with sluggish flow of contrast through the duodenum--possibility of a more distal SBO is not excluded given this appearance. GI Surgeries / Procedures:  - 9/18: small bowel endoscopy: Normal appearing, widely patent esophagus and GEJ. Edematous gastric folds-biopsied. A single duodenal polyp in the post-bulbar duodenum-biopsied  Central access: port vs PICC (no PICC currently) TPN start date: pending 9/26  Nutritional Goals: Goal TPN rate is 75 mL/hr (provides 90 g of protein and 1746 kcals per  day)  RD Assessment: Estimated Needs Total Energy Estimated Needs: 1650-1850 Total Protein Estimated Needs: 85-100g Total Fluid Estimated Needs: 1.8L/day  Current Nutrition:  NPO Ice chips and sips with meds  Plan:  - Con't TPN at 69mL/hr at 1800 (provides 48 g/day of protein and 932.6 total kcal/day)--will not increase rate due to likely refeeding - Electrolytes in TPN:  Na 62mEq/L K 70mEq/L (extra in next bag at this low rate) Ca 76mEq/L Mg 38mEq/L(extra 1g in next bag at this low rate) Phos 24mmol/L Cl:Ac 1:1 - Add standard MVI and trace elements to TPN - Initiate Sensitive q4h SSI and adjust as needed  - Give thiamine 100mg  daily x5 days - Monitor TPN labs on Mon/Thurs, and daily thru 9/29 - K+ IV x 4 then Kdur po x 2 (Can take some oral meds, RN will try oral route)  Zarina Pe S. Merilynn Finland, PharmD, BCPS Clinical Staff Pharmacist [email or web address]  9/27/20247:45 AM

## 2023-07-25 NOTE — Progress Notes (Signed)
Mobility Specialist - Progress Note   07/25/23 1115  Mobility  Activity Ambulated independently in hallway  Level of Assistance Independent  Assistive Device None  Distance Ambulated (ft) 1000 ft  Activity Response Tolerated well  Mobility Referral Yes  $Mobility charge 1 Mobility  Mobility Specialist Start Time (ACUTE ONLY) 1059  Mobility Specialist Stop Time (ACUTE ONLY) 1114  Mobility Specialist Time Calculation (min) (ACUTE ONLY) 15 min   Pt received in bed and agreeable to mobility. No complaints during session. Pt to recliner after session with all needs met.    [facility]

## 2023-07-25 NOTE — Progress Notes (Signed)
Sherri Gill   DOB:06-Nov-1950   OH#:[id number]    ASSESSMENT & PLAN:   Recurrent uterine cancer She presents with partial small bowel obstruction Appreciate GI consult Already feeling better with NG tube decompression and IV fluids I reviewed the operative note from her EGD and biopsy results Biopsy is taken but no signs of malignant obstruction, negative for malignancy Further imaging studies show no definitive signs of obstruction but possibly interference with peristalsis from peritoneal disease At current state, she is not able to maintain nutritional state as she has chronic nausea vomiting   We discussed risk, benefits, side effects of chemotherapy I recommend resumption of chemotherapy with carboplatin and paclitaxel which she tolerated well in the past She tolerated chemotherapy with carboplatin and paclitaxel 07/23/2023 In the meantime, continue supportive care   Nausea, vomiting and dehydration Feeling better after NG tube decompression She had recurrent nausea yesterday requiring intermittent suction Will allow some ice chips and sips of water I recommend trial of clamping during daytime and resume continuous suction at nighttime over the weekend I am hopeful we can get her NG tube removed next week   Anemia chronic disease She is not symptomatic Observe only   Dehydration, mild hypokalemia and other electrolyte imbalance, severe malnutrition Anticipate prolonged ileus and the patient is already malnourished Agree with TPN   Discharge planning Unknown  All questions were answered. The patient knows to call the clinic with any problems, questions or concerns.   The total time spent in the appointment was 40 minutes encounter with patients including review of chart and various tests results, discussions about plan of care and coordination of care plan  Artis Delay, MD 07/25/2023 8:02 AM  Subjective:  She had a bout of nausea yesterday requiring intermittent suction  through the NG tube.  She felt okay this morning.  She had passage of flatus.  Her pain is well-controlled.  She was able to walk around without difficulties.  Objective:  Vitals:   07/25/23 0011 07/25/23 0627  BP: (!) 153/59 (!) 148/58  Pulse: 71 73  Resp:  16  Temp:  98.8 F (37.1 C)  SpO2:  97%     Intake/Output Summary (Last 24 hours) at 07/25/2023 0802 Last data filed at 07/24/2023 1742 Gross per 24 hour  Intake 700.1 ml  Output --  Net 700.1 ml    GENERAL:alert, no distress and comfortable ABDOMEN:abdomen soft, non-tender and a little firm but not significantly distended Musculoskeletal:no cyanosis of digits and no clubbing  NEURO: alert & oriented x 3 with fluent speech, no focal motor/sensory deficits   Labs:  Recent Labs    07/22/23 0600 07/23/23 0500 07/23/23 2043 07/24/23 0500  NA 137 135 137 136  K 3.4* 3.7 3.1* 3.2*  CL 106 106 107 106  CO2 26 24 21* 23  GLUCOSE 132* 147* 164* 147*  BUN 8 9 10 10   CREATININE 0.87 0.78 0.67 0.80  CALCIUM 9.0 8.7* 8.6* 8.2*  GFRNONAA >60 >60 >60 >60  PROT 6.1*  --  5.9* 5.7*  ALBUMIN 3.1*  --  2.9* 2.8*  AST 14*  --  11* 12*  ALT 14  --  13 12  ALKPHOS 116  --  103 98  BILITOT 0.4  --  0.8 0.6    Studies:  DG Abd 1 View  Result Date: 07/22/2023 CLINICAL DATA:  [id number] Encounter for nasogastric (NG) tube placement [id number]. EXAM: ABDOMEN - 1 VIEW COMPARISON:  Abdominal radiograph 07/19/2023. FINDINGS: Right chest Port-A-Cath  tip projects over the right atrium. Enteric tube tip and side port project over the stomach. Scant retained contrast in visualized portions of the colon. IMPRESSION: Enteric tube tip and side port project over the stomach. Electronically Signed   By: Orvan Falconer M.D.   On: 07/22/2023 14:27   DG Abd 1 View  Result Date: 07/19/2023 CLINICAL DATA:  [id number] SBO (small bowel obstruction) (HCC) [id number] EXAM: ABDOMEN - 1 VIEW COMPARISON:  July 18, 2023 FINDINGS: Enteric contrast has progressed to the  rectum. Multiple colonic diverticuli. No dilated loops of bowel are seen. IMPRESSION: Enteric contrast has progressed to the rectum. Electronically Signed   By: Meda Klinefelter M.D.   On: 07/19/2023 10:22   DG UGI W SINGLE CM (SOL OR THIN BA)  Result Date: 07/18/2023 CLINICAL DATA:  Endometrial/uterine cancer. Fold thickening in the stomach on recent endoscopy. Reflux and bloating. Difficulty keeping foods and liquids down. EXAM: UPPER GI SERIES WITH KUB TECHNIQUE: After obtaining a scout radiograph a routine upper GI series was performed using thin barium. Mobility was mildly limited on today's exam, for example the patient was unable to turn prone. FLUOROSCOPY: Radiation Exposure Index (as provided by the fluoroscopic device): 21.7 mGy Kerma COMPARISON:  CT abdomen 07/12/2023 FINDINGS: Initial KUB unremarkable aside from mild levoconvex lumbar scoliosis. The pharyngeal phase of swallowing appears normal. Primary peristaltic waves were intact on 4/4 swallows. Small type 1 hiatal hernia. Occasional gastroesophageal reflux was observed on today's exam. I used mucosal relief and balloon compression of the stomach in order to help mitigate the use of thin barium and single contrast technique as well as the patient is inability to perform prone imaging. These demonstrate a gastric fold thickening as shown on prior CT and prior endoscopy. The duodenum is mildly dilated, especially proximally. Eventually this proceeded on to the fourth portion of the duodenum as shown on image 1 series 15. However, the contrast column was sluggish to flow through the duodenum, and given the somewhat dilated appearance, the possibility of obstruction distally is not excluded. Consider following the barium column with serial KUB use in assessing for small bowel obstruction in several hours. A 13 mm barium tablet passed briskly into the stomach. Port-A-Cath noted. IMPRESSION: 1. Gastric fold thickening as shown on prior CT and  endoscopy. 2. Small type 1 hiatal hernia. 3. Intermittent gastroesophageal reflux. 4. Mildly dilated duodenum, with sluggish flow of contrast through the duodenum. The possibility of a more distal small bowel obstruction is not excluded given this appearance. Consider KUB follow-up in several hours to assess for transit of the barium column through the small bowel in assessing for small bowel obstruction. Electronically Signed   By: Gaylyn Rong M.D.   On: 07/18/2023 12:00   DG Abd Portable 1 View  Result Date: 07/05/2023 CLINICAL DATA:  Nasogastric tube placement. EXAM: PORTABLE ABDOMEN - 1 VIEW COMPARISON:  Abdomen and pelvis CT dated 07/12/2023. FINDINGS: Normal bowel-gas pattern. Interval nasogastric tube with its tip and side hole in the proximal to mid stomach. Lower thoracic spine degenerative changes. IMPRESSION: Nasogastric tube tip and side hole in the proximal to mid stomach. Electronically Signed   By: Beckie Salts M.D.   On: 07/26/2023 17:09   DG Abdomen Acute W/Chest  Result Date: 07/13/2023 CLINICAL DATA:  Vomiting EXAM: DG ABDOMEN ACUTE WITH 1 VIEW CHEST COMPARISON:  07/10/2023. FINDINGS: There is no evidence of dilated bowel loops or free intraperitoneal air. No radiopaque calculi or other significant radiographic abnormality is seen. Heart  size and mediastinal contours are within normal limits. Both lungs are clear. There are thoracic degenerative changes. Right-sided Port-A-Cath tip overlies distal SVC. IMPRESSION: Negative abdominal radiographs.  No acute cardiopulmonary disease. Electronically Signed   By: Layla Maw M.D.   On: 07/12/2023 14:40   CT ABDOMEN PELVIS W CONTRAST  Result Date: 07/12/2023 CLINICAL DATA:  Endometrial/uterine cancer.  Nausea and vomiting. * Tracking Code: BO * EXAM: CT ABDOMEN AND PELVIS WITH CONTRAST TECHNIQUE: Multidetector CT imaging of the abdomen and pelvis was performed using the standard protocol following bolus administration of  intravenous contrast. RADIATION DOSE REDUCTION: This exam was performed according to the departmental dose-optimization program which includes automated exposure control, adjustment of the mA and/or kV according to patient size and/or use of iterative reconstruction technique. CONTRAST:  OMNIPAQUE IOHEXOL 300 MG/ML  SOLN COMPARISON:  CT abdomen/pelvis from 06/17/2023 FINDINGS: Lower chest: A catheter terminates in the right atrium on the top most image. Mild cardiomegaly. Hepatobiliary: Unremarkable Pancreas: Unremarkable Spleen: Unremarkable Adrenals/Urinary Tract: Unremarkable Stomach/Bowel: Diffuse gastric wall thickening with mild gastric and substantial proximal duodenal distension. After crossing the midline, the duodenum does not appear distended but there is substantial distal duodenal and jejunal wall thickening, worsened from prior exams. Scattered colonic diverticula. Appendix unremarkable. No pneumatosis. No hypoenhancement of bowel wall. There are abnormal contains small fluid collections along the small bowel mesentery, for example along the left abdomen on image 64 series 7 and along the right abdomen on image 55 series 7. In addition there is fluid density in the lesser sac on image 76 series 7. Vascular/Lymphatic: Mild atheromatous vascular disease of the aortoiliac tree. Patent celiac trunk and SMA. Patent superior mesenteric vein. Suspected left iliac adenopathy measuring about 1.3 cm in diameter on image 47 series 2, stable. This is of similar density to the adjacent vein. Reproductive: Uterus absent. Nodular enhancement along the left vaginal cuff measuring 2.5 by 1.7 cm, previously 2.3 by 1.7 cm by my measurements. Tumor in this location is not excluded. Other: There is some abnormal stranding along the remaining omentum for example on image 73 series 9. Small nodular deposit along the upper omentum on image 31 series 2. Musculoskeletal: Lower thoracic and lumbar spondylosis. Density  within the umbilicus on image 102 series 9, potentially a small knuckle of bowel versus tumor. IMPRESSION: 1. Worsening of gastric wall thickening, distal duodenal wall thickening, and jejunal wall thickening. The stomach and proximal duodenum are distended. The appearance favors gastroenteritis. 2. There is also some abnormal stranding along the remaining omentum. There are also abnormal fluid collections along the small bowel mesentery and in the lesser sac. The appearance raises concern for possible residual peritoneal tumor causing fluid deposits. Small nodular deposit along the upper omentum along with nodularity in the umbilicus. 3. Nodular enhancement along the left vaginal cuff measuring 2.5 by 1.7 cm, previously 2.3 by 1.7 cm by my measurements. This likely represents tumor. 4. Suspected left iliac adenopathy measuring about 1.3 cm in diameter, stable. 5. Mild cardiomegaly. 6. Scattered colonic diverticula. 7. Mild atheromatous vascular disease of the aortoiliac tree. 8. Lower thoracic and lumbar spondylosis. Electronically Signed   By: Gaylyn Rong M.D.   On: 07/12/2023 14:56   DG Abd 1 View  Result Date: 07/10/2023 CLINICAL DATA:  Abdominal pain, vomiting. History of uterine cancer. EXAM: ABDOMEN - 1 VIEW COMPARISON:  April 09, 2022. FINDINGS: The bowel gas pattern is normal. Surgical clip is seen over the left sacrum. No radio-opaque calculi or other significant  radiographic abnormality are seen. IMPRESSION: No abnormal bowel dilatation. Electronically Signed   By: Lupita Raider M.D.   On: 07/10/2023 14:18

## 2023-07-25 NOTE — Progress Notes (Signed)
HOSPITALIST ROUNDING NOTE Sherri Gill UEA:[id number]  DOB: 07/18/1951  DOA: 07/11/2023  PCP: [name], MD  07/25/2023,11:10 AM   LOS: 11 days      Code Status: Full    From:      Current Dispo:      72 year old black female Prior HSV meningitis in 2015 DM TY 2 HTN HLD IDA Paroxysmal A-fib on Xarelto Metastatic serous carcinoma of uterus status post omental BH TSO Dr. [name] Seen several times over the past 2 weeks for poor p.o. intake with constant nausea vomiting + constipation and straining despite MiraLAX senna  Seen in ED 07/12/2023 for symptoms CT ABD = gastric wall thickening distal duodenal wall thickening and junctional wall thickening with stomach and proximal duodenal distention?  Residual peritoneal tumor-patient was able to tolerate food and was discharged home  Re-presented 07/12/2023 to the med center at [facility] Pulse 111 Tmax 97 WBC 10.6 BUN/creatinine 24/1.2 acute abdominal x-rays negative-NG tube was placed secondary to possible malignant stricture of proximal small bowel GI Dr. [name] consulted 2/2?  Need upper endoscopy patient kept n.p.o. . 9/18 EGD-patent esophagus --push enteroscopy ruled out gastric outlet obstruction with thickened gastric folds 9/20 upper GI series KUB contrast progressing to rectum--- despite this, starting Reglan still nauseous 9/21 [facility] signed off 9/24 NG tube reinserted 9/25 resumption chemo paclitaxel carboplatin 9/26 clamping trials  Plan   Carcinomatosis causing delayed gastric motility -small bowel obstruction ruled out as above with endoscopy, upper GI series Abdomen feels better with clamping trials and mild p.o. intake on liquids-much Continue Reglan 10 Q8, Compazine 5 every 6 refractory nausea, Pepcid 20 daily at bedtime Zofran 4 every 6 as needed and pantoprazole 40 twice daily Requires TPN at this time until p.o. intake can be confirmed--watch for refeeding with phosphorus magnesium etc.  Metastatic  serous carcinoma of uterus Resumed 9/25 Rx carboplatin paclitaxel-rest as per Dr. Tanya Nones  Paroxysmal A-fib CHADVASC >4 on Xarelto continue Xarelto orally Continue Cardizem 30 every 6, added metoprolol 12.5 XL scheduled  metoprolol IV for heart rate above 120--this has been stable  HTN holding hydralazine, losartan, atorvastatin etc. etc.  DM TY 2 now n.p.o. A1c recently 6.4 Hold metformin 1000 twice daily  CBGs overall below 150  Hypothyroid Synthroid on hold since admission-give IV-dose 25 mcg  AKI, hypokalemia-treated-periodic labs Now that is on TPN getting runs of IV K with pharmacy-recheck all electrolytes in a.m.  Normocytic anemia query B12 deficiency Trend as needed  DVT prophylaxis: Xarelto p.o.  Status is: Inpatient Remains inpatient appropriate because:   Requires resolution of issues    Subjective:  Less bloated Some nausea this morning but relieved with medication and unclamping the NG Abdomen is much softer to her She had some flatus as well  Objective + exam Vitals:   07/24/23 1308 07/24/23 2009 07/25/23 0011 07/25/23 0627  BP: 135/60 (!) 139/58 (!) 153/59 (!) 148/58  Pulse: 65 72 71 73  Resp: 20 18  16   Temp: 98.1 F (36.7 C) 98.6 F (37 C)  98.8 F (37.1 C)  TempSrc: Oral Oral  Oral  SpO2: 99% 100%  97%  Weight:      Height:       Filed Weights   07/17/2023 0410 07/24/2023 1300 07/23/23 0422  Weight: 80.9 kg 80.9 kg 82.2 kg    Examination:  [place] black female no distress NG tube in place S1-S2 no murmur seems to be in sinus Abdomen much softer less distended no rebound  Trace lower extremity edema ROM intact  Data Reviewed: reviewed   CBC    Component Value Date/Time   WBC 6.9 07/23/2023 0500   RBC 2.97 (L) 07/23/2023 0500   HGB 8.1 (L) 07/23/2023 0500   HGB 10.1 (L) 07/10/2023 1331   HGB 9.8 (L) 03/25/2023 1541   HCT 26.2 (L) 07/23/2023 0500   HCT 31.5 (L) 03/25/2023 1541   PLT 253 07/23/2023 0500   PLT 384 07/10/2023  1331   PLT 237 03/25/2023 1541   MCV 88.2 07/23/2023 0500   MCV 84 03/25/2023 1541   MCH 27.3 07/23/2023 0500   MCHC 30.9 07/23/2023 0500   RDW 15.6 (H) 07/23/2023 0500   RDW 17.0 (H) 03/25/2023 1541   LYMPHSABS 0.9 07/23/2023 0500   LYMPHSABS 2.8 05/31/2021 1531   MONOABS 0.3 07/23/2023 0500   EOSABS 0.0 07/23/2023 0500   EOSABS 0.3 05/31/2021 1531   BASOSABS 0.0 07/23/2023 0500   BASOSABS 0.0 05/31/2021 1531      Latest Ref Rng & Units 07/25/2023    8:21 AM 07/24/2023    5:00 AM 07/23/2023    8:43 PM  CMP  Glucose 70 - 99 mg/dL 540  981  191   BUN 8 - 23 mg/dL 11  10  10    Creatinine 0.44 - 1.00 mg/dL 4.78  2.95  6.21   Sodium 135 - 145 mmol/L 135  136  137   Potassium 3.5 - 5.1 mmol/L 2.8  3.2  3.1   Chloride 98 - 111 mmol/L 104  106  107   CO2 22 - 32 mmol/L 24  23  21    Calcium 8.9 - 10.3 mg/dL 8.4  8.2  8.6   Total Protein 6.5 - 8.1 g/dL 5.8  5.7  5.9   Total Bilirubin 0.3 - 1.2 mg/dL 0.4  0.6  0.8   Alkaline Phos 38 - 126 U/L 100  98  103   AST 15 - 41 U/L 12  12  11    ALT 0 - 44 U/L 12  12  13       Scheduled Meds:  Chlorhexidine Gluconate Cloth  6 each Topical Daily   diltiazem  30 mg Per Tube Q6H   dorzolamide-timolol  1 drop Both Eyes BID   insulin aspart  0-9 Units Subcutaneous Q4H   latanoprost  1 drop Both Eyes QHS   levothyroxine  37.5 mcg Intravenous Daily   metoCLOPramide (REGLAN) injection  10 mg Intravenous Q8H   pantoprazole (PROTONIX) IV  40 mg Intravenous BID AC   potassium chloride  20 mEq Oral Q2H   rivaroxaban  20 mg Per Tube Daily   sodium chloride flush  10-40 mL Intracatheter Q12H   thiamine (VITAMIN B1) injection  100 mg Intravenous Q24H   Continuous Infusions:  famotidine (PEPCID) IV 20 mg (07/25/23 0007)   potassium chloride     TPN ADULT (ION) 40 mL/hr at 07/24/23 1742   TPN ADULT (ION)      Time 40  Rhetta Mura, MD  [facility]

## 2023-07-25 NOTE — Progress Notes (Signed)
This a.m. pt stated her abd felt full and she was nauseated. Hooked pt up to [facility] and out put was . After several minutes of being on LWS. Patient stated she felt better.  Pt also received new PRN order for trazodone 50 mg stated that she could not sleep.

## 2023-07-26 DIAGNOSIS — K311 Adult hypertrophic pyloric stenosis: Secondary | ICD-10-CM | POA: Diagnosis not present

## 2023-07-26 LAB — COMPREHENSIVE METABOLIC PANEL
ALT: 9 U/L (ref 0–44)
AST: 12 U/L — ABNORMAL LOW (ref 15–41)
Albumin: 2.8 g/dL — ABNORMAL LOW (ref 3.5–5.0)
Alkaline Phosphatase: 97 U/L (ref 38–126)
Anion gap: 7 (ref 5–15)
BUN: 14 mg/dL (ref 8–23)
CO2: 26 mmol/L (ref 22–32)
Calcium: 8.6 mg/dL — ABNORMAL LOW (ref 8.9–10.3)
Chloride: 102 mmol/L (ref 98–111)
Creatinine, Ser: 0.7 mg/dL (ref 0.44–1.00)
GFR, Estimated: >60 mL/min (ref >60)
Glucose, Bld: 134 mg/dL — ABNORMAL HIGH (ref 70–99)
Potassium: 4 mmol/L (ref 3.5–5.1)
Sodium: 135 mmol/L (ref 135–145)
Total Bilirubin: 0.5 mg/dL (ref 0.3–1.2)
Total Protein: 5.8 g/dL — ABNORMAL LOW (ref 6.5–8.1)

## 2023-07-26 LAB — GLUCOSE, CAPILLARY
Glucose-Capillary: 128 mg/dL — ABNORMAL HIGH (ref 70–99)
Glucose-Capillary: 127 mg/dL — ABNORMAL HIGH (ref 70–99)
Glucose-Capillary: 119 mg/dL — ABNORMAL HIGH (ref 70–99)
Glucose-Capillary: 131 mg/dL — ABNORMAL HIGH (ref 70–99)
Glucose-Capillary: 155 mg/dL — ABNORMAL HIGH (ref 70–99)

## 2023-07-26 LAB — PHOSPHORUS: Phosphorus: 2.5 mg/dL (ref 2.5–4.6)

## 2023-07-26 LAB — MAGNESIUM: Magnesium: 1.7 mg/dL (ref 1.7–2.4)

## 2023-07-26 MED ORDER — DILTIAZEM HCL ER COATED BEADS 120 MG PO CP24
120.0000 mg | ORAL_CAPSULE | Freq: Every day | ORAL | Status: DC
  Filled 2023-07-26: qty 1

## 2023-07-26 MED ORDER — LEVOTHYROXINE SODIUM 50 MCG PO TABS
50.0000 ug | ORAL_TABLET | Freq: Every day | ORAL | Status: DC
  Administered 2023-07-27 – 2023-08-08 (×13): 50 ug via ORAL
  Filled 2023-07-26 (×14): qty 1

## 2023-07-26 MED ORDER — TRAVASOL 10 % IV SOLN
INTRAVENOUS | Status: AC
  Filled 2023-07-26: qty 720

## 2023-07-26 MED ORDER — LOSARTAN POTASSIUM 50 MG PO TABS
25.0000 mg | ORAL_TABLET | Freq: Every day | ORAL | Status: DC
  Administered 2023-07-26 – 2023-07-28 (×3): 25 mg via ORAL
  Filled 2023-07-26 (×3): qty 1

## 2023-07-26 MED ORDER — DILTIAZEM HCL ER COATED BEADS 180 MG PO CP24: 180.0000 mg | ORAL_CAPSULE | Freq: Every day | ORAL | Status: DC

## 2023-07-26 MED ORDER — MAGNESIUM SULFATE 2 GM/50ML IV SOLN
2.0000 g | Freq: Once | INTRAVENOUS | Status: AC
  Administered 2023-07-26: 2 g via INTRAVENOUS
  Filled 2023-07-26: qty 50

## 2023-07-26 MED ORDER — DILTIAZEM HCL 30 MG PO TABS
30.0000 mg | ORAL_TABLET | Freq: Four times a day (QID) | ORAL | Status: DC
  Administered 2023-07-26 – 2023-07-31 (×16): 30 mg via ORAL
  Filled 2023-07-26 (×19): qty 1

## 2023-07-26 MED ORDER — PHENOL 1.4 % MT LIQD
1.0000 | OROMUCOSAL | Status: DC | PRN
  Administered 2023-07-27 – 2023-07-29 (×2): 1 via OROMUCOSAL
  Filled 2023-07-26: qty 177

## 2023-07-26 NOTE — Progress Notes (Signed)
Mobility Specialist - Progress Note   07/26/23 1410  Mobility  Activity Ambulated independently in hallway  Level of Assistance Independent  Assistive Device None  Distance Ambulated (ft) 1000 ft  Activity Response Tolerated well  Mobility Referral Yes  $Mobility charge 1 Mobility  Mobility Specialist Start Time (ACUTE ONLY) 0152  Mobility Specialist Stop Time (ACUTE ONLY) 0209  Mobility Specialist Time Calculation (min) (ACUTE ONLY) 17 min   Pt received in recliner and agreeable to mobility. No complaints during session. Pt to recliner after session with all needs met.    [facility]

## 2023-07-26 NOTE — Progress Notes (Signed)
HOSPITALIST ROUNDING NOTE Sherri Gill:[id number]  DOB: Aug 23, 1951  DOA: 07/23/2023  PCP: [name], MD  07/26/2023,1:16 PM   LOS: 12 days      Code Status: Full    From:      Current Dispo:      72 year old black female Prior HSV meningitis in 2015 DM TY 2 HTN HLD IDA Paroxysmal A-fib on Xarelto Metastatic serous carcinoma of uterus status post omental BH TSO Dr. [name] Seen several times over the past 2 weeks for poor p.o. intake with constant nausea vomiting + constipation and straining despite MiraLAX senna  Seen in ED 07/12/2023 for symptoms CT ABD = gastric wall thickening distal duodenal wall thickening and junctional wall thickening with stomach and proximal duodenal distention?  Residual peritoneal tumor-patient was able to tolerate food and was discharged home  Re-presented 07/19/2023 to the med center at [facility] Pulse 111 Tmax 97 WBC 10.6 BUN/creatinine 24/1.2 acute abdominal x-rays negative-NG tube was placed secondary to possible malignant stricture of proximal small bowel GI Dr. [name] consulted 2/2?  Need upper endoscopy patient kept n.p.o. . 07-25-23 EGD-patent esophagus --push enteroscopy ruled out gastric outlet obstruction with thickened gastric folds 9/20 upper GI series KUB contrast progressing to rectum--- despite this, starting Reglan still nauseous 9/21 [facility] signed off 9/24 NG tube reinserted 9/25 resumption chemo paclitaxel carboplatin 9/26 clamping trials  Plan   Carcinomatosis causing delayed gastric motility -small bowel obstruction ruled out as above with endoscopy, upper GI series Continues to pass gas 1 small stool today clamping trials-increase to full liquids tomorrow if continues to have passage of flatus/stool Requires TPN ---TPN protocol electrolytes-Hope to graduate diet in a.m. Continue Reglan 10 Q8, Compazine 5 every 6 refractory nausea, Pepcid 20 daily at bedtime Zofran 4 every 6 as needed and pantoprazole 40 twice  daily  Metastatic serous carcinoma of uterus Resumed 9/25 Rx carboplatin paclitaxel-rest as per Dr. Tanya Nones  Paroxysmal A-fib CHADVASC >4 on Xarelto continue Xarelto orally Convert Cardizem to 120 CD, added metoprolol 12.5 XL scheduled  metoprolol IV for heart rate above 120--this has been stable  HTN  Resume losartan at lower dose of 25 given uncontrolled nature of blood pressure Discontinue hydralazine  DM TY 2 now n.p.o. A1c recently 6.4 Hold metformin 1000 twice daily until reliably taking p.o. CBGs overall below 150  Hypothyroid Resume Synthroid 50 mcg   AKI, hypokalemia-treated-periodic labs Resolving is on TPN-periodic labs  Normocytic anemia query B12 deficiency Trend as needed  DVT prophylaxis: Xarelto p.o.  Status is: Inpatient Remains inpatient appropriate because:   Requires resolution of issues    Subjective:  Ambulating the unit 1 small stool today continues to pass flatus clamping trial seem to be working well  Objective + exam Vitals:   07/25/23 1647 07/25/23 1948 07/26/23 0417 07/26/23 1250  BP: (!) 147/71 (!) 146/64 (!) 146/71 137/77  Pulse: 85 76 81   Resp: 16 14 14    Temp: 98.6 F (37 C) 98.3 F (36.8 C) 98.1 F (36.7 C)   TempSrc: Oral Oral Oral   SpO2: 97% 97% 96%   Weight:   82.2 kg   Height:       Filed Weights   25-Jul-2023 1300 07/23/23 0422 07/26/23 0417  Weight: 80.9 kg 82.2 kg 82.2 kg    Examination:  Weight coherent looks fair walking the hallways Chest clear Abdomen slight distention still NG tube in place clamped off  Data Reviewed: reviewed   CBC    Component  Value Date/Time   WBC 6.9 07/23/2023 0500   RBC 2.97 (L) 07/23/2023 0500   HGB 8.1 (L) 07/23/2023 0500   HGB 10.1 (L) 07/10/2023 1331   HGB 9.8 (L) 03/25/2023 1541   HCT 26.2 (L) 07/23/2023 0500   HCT 31.5 (L) 03/25/2023 1541   PLT 253 07/23/2023 0500   PLT 384 07/10/2023 1331   PLT 237 03/25/2023 1541   MCV 88.2 07/23/2023 0500   MCV 84 03/25/2023  1541   MCH 27.3 07/23/2023 0500   MCHC 30.9 07/23/2023 0500   RDW 15.6 (H) 07/23/2023 0500   RDW 17.0 (H) 03/25/2023 1541   LYMPHSABS 0.9 07/23/2023 0500   LYMPHSABS 2.8 05/31/2021 1531   MONOABS 0.3 07/23/2023 0500   EOSABS 0.0 07/23/2023 0500   EOSABS 0.3 05/31/2021 1531   BASOSABS 0.0 07/23/2023 0500   BASOSABS 0.0 05/31/2021 1531      Latest Ref Rng & Units 07/26/2023    4:35 AM 07/25/2023    8:21 AM 07/24/2023    5:00 AM  CMP  Glucose 70 - 99 mg/dL 478  295  621   BUN 8 - 23 mg/dL 14  11  10    Creatinine 0.44 - 1.00 mg/dL 3.08  6.57  8.46   Sodium 135 - 145 mmol/L 135  135  136   Potassium 3.5 - 5.1 mmol/L 4.0  2.8  3.2   Chloride 98 - 111 mmol/L 102  104  106   CO2 22 - 32 mmol/L 26  24  23    Calcium 8.9 - 10.3 mg/dL 8.6  8.4  8.2   Total Protein 6.5 - 8.1 g/dL 5.8  5.8  5.7   Total Bilirubin 0.3 - 1.2 mg/dL 0.5  0.4  0.6   Alkaline Phos 38 - 126 U/L 97  100  98   AST 15 - 41 U/L 12  12  12    ALT 0 - 44 U/L 9  12  12       Scheduled Meds:  Chlorhexidine Gluconate Cloth  6 each Topical Daily   diltiazem  30 mg Per Tube Q6H   dorzolamide-timolol  1 drop Both Eyes BID   insulin aspart  0-9 Units Subcutaneous Q4H   latanoprost  1 drop Both Eyes QHS   levothyroxine  37.5 mcg Intravenous Daily   metoCLOPramide (REGLAN) injection  10 mg Intravenous Q8H   metoprolol succinate  12.5 mg Oral Daily   pantoprazole (PROTONIX) IV  40 mg Intravenous BID AC   rivaroxaban  20 mg Per Tube Daily   sodium chloride flush  10-40 mL Intracatheter Q12H   thiamine (VITAMIN B1) injection  100 mg Intravenous Q24H   Continuous Infusions:  famotidine (PEPCID) IV 100 mL/hr at 07/26/23 0644   TPN ADULT (ION) 40 mL/hr at 07/26/23 0644   TPN ADULT (ION)      Time 25  Rhetta Mura, MD  [facility]

## 2023-07-26 NOTE — Progress Notes (Signed)
Pt with nausea requiring unclamping of NGT back to LIWS. Offered nausea med vs. resumption of suction, she was insistent on using NGT. Will monitor output and symptoms over the next few hours. Mick Sell RN

## 2023-07-26 NOTE — Progress Notes (Signed)
PHARMACY - TOTAL PARENTERAL NUTRITION CONSULT NOTE   Indication: Prolonged ileus  Patient Measurements: Height: 5\' 3"  (160 cm) Weight: 82.2 kg (181 lb 3.5 oz) IBW/kg (Calculated) : 52.4 TPN AdjBW (KG): 59.5 Body mass index is 32.1 kg/m.  Assessment:  [name] admitted 9/16 for nausea/vomiting x 2.5 weeks. Patient with h/o recurrent uterine cancer--Bx taken during 9/18 small bowel enteroscopy but no signs of malignant obstruction, negative for malignancy. Oncology recommended resumption of chemotherapy during this admission--carboplatin and paclitaxel completed 9/25.   Patient has severe malnutrition given inability to keep food down with continued N/V during this admission and PTA. Pharmacy consulted for TPN management.  - Carcinomatosis causing delayed gastric motility   Glucose / Insulin: Hx of T2DM - CBGs 118 - 124 in last 24 hrs (4 unit of SSI/24 hrs)  - Dexamethasone 10mg  x1 given 9/25  Electrolytes: - K 4 (after 60 IV & 20 PO 9/27)  - Mg 1.7 - phos 2.5 - CoCa 9.56 (WNL) - All other lytes WNL  Renal:  - Scr <1 - BUN WNL  Hepatic:  - LFTs, Tbili, Alk phos all WNL - Albumin low  Intake / Output; MIVF:  - UOP: x 3 occurrences - Emesis/NG output: 185 ml/24 hrs - 9/27 am recurrent nausea after NGT clamped. Per Onc plan is trial of clamping during daytime & continuous suction at nighttime - LBM 9/24  GI Meds: IV Famotidine/hs, IV Reglan/8hr, IV PPI BID,   GI Imaging: - 9/20 DG UGI: Gastric fold thickening as shown on prior CT and endoscopy. Mildly dilated duodenum, with sluggish flow of contrast through the duodenum--possibility of a more distal SBO is not excluded given this appearance. GI Surgeries / Procedures:  - 9/18: small bowel endoscopy: Normal appearing, widely patent esophagus and GEJ. Edematous gastric folds-biopsied. A single duodenal polyp in the post-bulbar duodenum-biopsied  Central access: port vs PICC (no PICC currently) TPN start date: pending  9/26  Nutritional Goals: Goal TPN rate is 75 mL/hr (provides 90 g of protein and 1746 kcals per day)  RD Assessment: Estimated Needs Total Energy Estimated Needs: 1650-1850 Total Protein Estimated Needs: 85-100g Total Fluid Estimated Needs: 1.8L/day  Current Nutrition:  NPO Ice chips and sips with meds  Plan:  Now: Mag 2 gm IVPB x 1  - Increase TPN to 60 mL/hr at 1800  - Electrolytes in TPN:  Na 70mEq/L K 44mEq/L  Ca 58mEq/L Mg 32mEq/L Phos 71mmol/L Cl:Ac 1:1 - Add standard MVI and trace elements to TPN - continue Sensitive q4h SSI and adjust as needed  -  thiamine 100mg  daily x5 days - Monitor TPN labs on Mon/Thurs, and daily thru 9/29   Herby Abraham, Pharm.D Use secure chat for questions 07/26/2023 9:02 AM

## 2023-07-27 ENCOUNTER — Inpatient Hospital Stay (HOSPITAL_COMMUNITY): Payer: Medicare HMO

## 2023-07-27 DIAGNOSIS — K311 Adult hypertrophic pyloric stenosis: Secondary | ICD-10-CM | POA: Diagnosis not present

## 2023-07-27 LAB — COMPREHENSIVE METABOLIC PANEL
ALT: 14 U/L (ref 0–44)
AST: 16 U/L (ref 15–41)
Albumin: 3.1 g/dL — ABNORMAL LOW (ref 3.5–5.0)
Alkaline Phosphatase: 109 U/L (ref 38–126)
Anion gap: 9 (ref 5–15)
BUN: 20 mg/dL (ref 8–23)
CO2: 26 mmol/L (ref 22–32)
Calcium: 9.2 mg/dL (ref 8.9–10.3)
Chloride: 97 mmol/L — ABNORMAL LOW (ref 98–111)
Creatinine, Ser: 0.76 mg/dL (ref 0.44–1.00)
GFR, Estimated: >60 mL/min (ref >60)
Glucose, Bld: 127 mg/dL — ABNORMAL HIGH (ref 70–99)
Potassium: 4.1 mmol/L (ref 3.5–5.1)
Sodium: 132 mmol/L — ABNORMAL LOW (ref 135–145)
Total Bilirubin: 0.5 mg/dL (ref 0.3–1.2)
Total Protein: 6.4 g/dL — ABNORMAL LOW (ref 6.5–8.1)

## 2023-07-27 LAB — MAGNESIUM: Magnesium: 1.9 mg/dL (ref 1.7–2.4)

## 2023-07-27 LAB — GLUCOSE, CAPILLARY
Glucose-Capillary: 118 mg/dL — ABNORMAL HIGH (ref 70–99)
Glucose-Capillary: 130 mg/dL — ABNORMAL HIGH (ref 70–99)
Glucose-Capillary: 139 mg/dL — ABNORMAL HIGH (ref 70–99)
Glucose-Capillary: 145 mg/dL — ABNORMAL HIGH (ref 70–99)
Glucose-Capillary: 164 mg/dL — ABNORMAL HIGH (ref 70–99)
Glucose-Capillary: 186 mg/dL — ABNORMAL HIGH (ref 70–99)

## 2023-07-27 LAB — PHOSPHORUS: Phosphorus: 3.7 mg/dL (ref 2.5–4.6)

## 2023-07-27 MED ORDER — NYSTATIN 100000 UNIT/ML MT SUSP
5.0000 mL | Freq: Four times a day (QID) | OROMUCOSAL | Status: DC
  Administered 2023-07-27: 500000 [IU] via ORAL
  Administered 2023-07-27: 5 mL via ORAL
  Administered 2023-07-28 – 2023-07-29 (×6): 500000 [IU] via ORAL
  Administered 2023-07-29: 100000 [IU] via ORAL
  Administered 2023-07-29 – 2023-08-02 (×14): 500000 [IU] via ORAL
  Filled 2023-07-27 (×25): qty 5

## 2023-07-27 MED ORDER — TRAVASOL 10 % IV SOLN
INTRAVENOUS | Status: AC
  Filled 2023-07-27: qty 900

## 2023-07-27 MED ORDER — SODIUM CHLORIDE 0.9 % IV SOLN
100.0000 mg | INTRAVENOUS | Status: DC
  Administered 2023-07-27 – 2023-07-31 (×5): 100 mg via INTRAVENOUS
  Filled 2023-07-27 (×6): qty 5

## 2023-07-27 MED ORDER — MAGNESIUM SULFATE IN D5W 1-5 GM/100ML-% IV SOLN
1.0000 g | Freq: Once | INTRAVENOUS | Status: AC
  Administered 2023-07-27: 1 g via INTRAVENOUS
  Filled 2023-07-27: qty 100

## 2023-07-27 MED ORDER — MAGIC MOUTHWASH W/LIDOCAINE
15.0000 mL | Freq: Four times a day (QID) | ORAL | Status: DC | PRN
  Administered 2023-07-27 (×2): 15 mL via ORAL
  Filled 2023-07-27 (×4): qty 15

## 2023-07-27 NOTE — Progress Notes (Signed)
Pt has had 600cc output from NGT since 2345 and reports relief of nausea. She has complained of extremely sore throat and copious thick white secretions she has to spit out; exam of oral cavity noted thick white coating on tongue. Paged on call NP and received order for magic MW w/ lidocaine, pt reported relief of discomfort with use of this. Mick Sell RN

## 2023-07-27 NOTE — Progress Notes (Signed)
HOSPITALIST ROUNDING NOTE Sherri Gill LKG:[id number]  DOB: January 03, 1951  DOA: 07-18-2023  PCP: [name], MD  07/27/2023,4:14 PM   LOS: 13 days      Code Status: Full    From:      Current Dispo:      72 year old black female Prior HSV meningitis in 2015 DM TY 2 HTN HLD IDA Paroxysmal A-fib on Xarelto Metastatic serous carcinoma of uterus status post omental BH TSO Dr. [name] Seen several times over the past 2 weeks for poor p.o. intake with constant nausea vomiting + constipation and straining despite MiraLAX senna  Seen in ED 07/12/2023 for symptoms CT ABD = gastric wall thickening distal duodenal wall thickening and junctional wall thickening with stomach and proximal duodenal distention?  Residual peritoneal tumor-patient was able to tolerate food and was discharged home  Re-presented 07/18/23 to the med center at [facility] Pulse 111 Tmax 97 WBC 10.6 BUN/creatinine 24/1.2 acute abdominal x-rays negative-NG tube was placed secondary to possible malignant stricture of proximal small bowel GI Dr. [name] consulted 2/2?  Need upper endoscopy patient kept n.p.o. . 9/18 EGD-patent esophagus --push enteroscopy ruled out gastric outlet obstruction with thickened gastric folds 9/20 upper GI series KUB contrast progressing to rectum--- despite this, starting Reglan still nauseous 9/21 [facility] signed off 9/24 NG tube reinserted 9/25 resumption chemo paclitaxel carboplatin 9/26 clamping trials 9/29 clamping trials failed placed back on low intermittent wall suction keep n.p.o. for now  Plan   Carcinomatosis causing delayed gastric motility -small bowel obstruction ruled out as above with endoscopy, upper GI series Keep n.p.o. as worsening symptoms now on LIWS may need to convert all meds back to IV if she continues to have no passage of flatus, stool-continue to mobilize as best possible--Obtain DG AXR --If no flatus in a.m. will trial enema etc.--- may need to involve GI  although not sure if this would change underlying mechanical process (carcinomatosis) causing obstruction On Reglan 10 Q8, Compazine 5 every 6 refractory nausea, Pepcid 20 daily at bedtime Zofran 4 every 6 as needed and pantoprazole 40 twice daily  Metastatic serous carcinoma of uterus Resumed 9/25 Rx carboplatin paclitaxel-rest as per Dr. [name] Hopeful for recovery of bowel function  Paroxysmal A-fib CHADVASC >4 on Xarelto continue Xarelto orally This admission added metoprolol 12.5 XL scheduled, continue Cardizem 30 every 6 as cannot give long-acting formulation metoprolol IV for heart rate above 120--this has been stable  Probable oropharyngeal thrush 9/29 Given immunocompromise state start Diflucan for pharmacy and switch Magic mouthwash to nystatin swish swallow  HTN  Resume losartan at lower dose of 25 given uncontrolled nature of blood pressure Discontinue hydralazine  DM TY 2 now n.p.o. A1c recently 6.4 Hold metformin 1000 twice daily until reliably taking p.o. CBGs overall below 150  Hypothyroid Resume Synthroid 50 mcg   AKI, hypokalemia-treated-periodic labs Resolving is on TPN-periodic labs  Normocytic anemia query B12 deficiency Trend as needed  DVT prophylaxis: Xarelto p.o.  Status is: Inpatient Remains inpatient appropriate because:   Requires resolution of issues      Subjective:  Persistent nausea last night and this morning, 600 cc out early a.m.  1400 cc out today sore throat--I do not appreciate any thrush She is feeling better however after the Magic mouthwash She has no chest pain Abdomen is more bloated than yesterday She is not passing gas and has not passed stool  Objective + exam Vitals:   07/26/23 1828 07/26/23 2009 07/27/23 0432 07/27/23 1246  BP: (!) 147/63 (!) 156/66 (!) 155/73 (!) 145/70  Pulse: 85 87 86 79  Resp:  18 16 16   Temp:  99.3 F (37.4 C) 98.7 F (37.1 C) 98 F (36.7 C)  TempSrc:  Oral Oral Oral  SpO2:  97% 97%  99%  Weight:      Height:       Filed Weights   07/18/2023 1300 07/23/23 0422 07/26/23 0417  Weight: 80.9 kg 82.2 kg 82.2 kg    Examination:  Looks more ill than yesterday Uncomfortable appearing black female Cannot appreciate thrush NG tube in place with yellow-brown succus in canister Abdomen slightly distended nontender Rectal deferred No lower extremity edema S1-S2 slightly tachycardic   Data Reviewed: reviewed   CBC    Component Value Date/Time   WBC 6.9 07/23/2023 0500   RBC 2.97 (L) 07/23/2023 0500   HGB 8.1 (L) 07/23/2023 0500   HGB 10.1 (L) 07/10/2023 1331   HGB 9.8 (L) 03/25/2023 1541   HCT 26.2 (L) 07/23/2023 0500   HCT 31.5 (L) 03/25/2023 1541   PLT 253 07/23/2023 0500   PLT 384 07/10/2023 1331   PLT 237 03/25/2023 1541   MCV 88.2 07/23/2023 0500   MCV 84 03/25/2023 1541   MCH 27.3 07/23/2023 0500   MCHC 30.9 07/23/2023 0500   RDW 15.6 (H) 07/23/2023 0500   RDW 17.0 (H) 03/25/2023 1541   LYMPHSABS 0.9 07/23/2023 0500   LYMPHSABS 2.8 05/31/2021 1531   MONOABS 0.3 07/23/2023 0500   EOSABS 0.0 07/23/2023 0500   EOSABS 0.3 05/31/2021 1531   BASOSABS 0.0 07/23/2023 0500   BASOSABS 0.0 05/31/2021 1531      Latest Ref Rng & Units 07/27/2023    7:36 AM 07/26/2023    4:35 AM 07/25/2023    8:21 AM  CMP  Glucose 70 - 99 mg/dL 664  403  474   BUN 8 - 23 mg/dL 20  14  11    Creatinine 0.44 - 1.00 mg/dL 2.59  5.63  8.75   Sodium 135 - 145 mmol/L 132  135  135   Potassium 3.5 - 5.1 mmol/L 4.1  4.0  2.8   Chloride 98 - 111 mmol/L 97  102  104   CO2 22 - 32 mmol/L 26  26  24    Calcium 8.9 - 10.3 mg/dL 9.2  8.6  8.4   Total Protein 6.5 - 8.1 g/dL 6.4  5.8  5.8   Total Bilirubin 0.3 - 1.2 mg/dL 0.5  0.5  0.4   Alkaline Phos 38 - 126 U/L 109  97  100   AST 15 - 41 U/L 16  12  12    ALT 0 - 44 U/L 14  9  12       Scheduled Meds:  Chlorhexidine Gluconate Cloth  6 each Topical Daily   diltiazem  30 mg Oral Q6H   dorzolamide-timolol  1 drop Both Eyes BID    insulin aspart  0-9 Units Subcutaneous Q4H   latanoprost  1 drop Both Eyes QHS   levothyroxine  50 mcg Oral Q0600   losartan  25 mg Oral QHS   metoprolol succinate  12.5 mg Oral Daily   pantoprazole (PROTONIX) IV  40 mg Intravenous BID AC   rivaroxaban  20 mg Per Tube Daily   sodium chloride flush  10-40 mL Intracatheter Q12H   thiamine (VITAMIN B1) injection  100 mg Intravenous Q24H   Continuous Infusions:  famotidine (PEPCID) IV Stopped (07/26/23 2150)   TPN  ADULT (ION) 60 mL/hr at 07/27/23 1534   TPN ADULT (ION)      Time 25  Rhetta Mura, MD  [facility]

## 2023-07-27 NOTE — Progress Notes (Signed)
PHARMACY - TOTAL PARENTERAL NUTRITION CONSULT NOTE   Indication: Prolonged ileus  Patient Measurements: Height: 5\' 3"  (160 cm) Weight: 82.2 kg (181 lb 3.5 oz) IBW/kg (Calculated) : 52.4 TPN AdjBW (KG): 59.5 Body mass index is 32.1 kg/m.  Assessment:  [name] admitted 9/16 for nausea/vomiting x 2.5 weeks. Patient with h/o recurrent uterine cancer--Bx taken during 9/18 small bowel enteroscopy but no signs of malignant obstruction, negative for malignancy. Oncology recommended resumption of chemotherapy during this admission--carboplatin and paclitaxel completed 9/25.   Patient has severe malnutrition given inability to keep food down with continued N/V during this admission and PTA. Pharmacy consulted for TPN management.  - Carcinomatosis causing delayed gastric motility   Glucose / Insulin: Hx of T2DM - CBGs 127 - 155 in last 24 hrs (5 unit of SSI/24 hrs) - acceptable  Electrolytes:  Na 132, K 4.1, Phos 3.7, Mag 1.9, CoCa 992 (WNL)  Renal:  - Scr <1 - BUN WNL  Hepatic:  - LFTs, Tbili, Alk phos all WNL - Albumin low  Intake / Output; MIVF:  - UOP: x 5 occurrences - Emesis/NG output: 875 ml/24 hrs - stool x 1 9/28 - 9/27 am recurrent nausea after NGT clamped.  - 9/28 PM nausea requiring unclamping of NGT & back to LIWS  GI Meds: IV Famotidine/hs, IV Reglan/8hr, IV PPI BID,   GI Imaging: - 9/20 DG UGI: Gastric fold thickening as shown on prior CT and endoscopy. Mildly dilated duodenum, with sluggish flow of contrast through the duodenum--possibility of a more distal SBO is not excluded given this appearance. GI Surgeries / Procedures:  - 9/18: small bowel endoscopy: Normal appearing, widely patent esophagus and GEJ. Edematous gastric folds-biopsied. A single duodenal polyp in the post-bulbar duodenum-biopsied  Central access: port vs PICC (no PICC currently) TPN start date: pending 9/26  Nutritional Goals: Goal TPN rate is 75 mL/hr (provides 90 g of protein and 1746  kcals per day)  RD Assessment: Estimated Needs Total Energy Estimated Needs: 1650-1850 Total Protein Estimated Needs: 85-100g Total Fluid Estimated Needs: 1.8L/day  Current Nutrition:  NPO Ice chips and sips with meds  Plan:  Now: Mag 1 gm IVPB x 1  - Increase TPN to goal rate of 75 mL/hr at 1800 to provide 100% of goals - Electrolytes in TPN:  Na 81mEq/L K 50Eq/L  Ca 30mEq/L Mg 71mEq/L Phos 100mmol/L Cl:Ac 1:1 - Add standard MVI and trace elements to TPN - continue Sensitive q4h SSI and adjust as needed  -  thiamine 100mg  daily x5 days - Monitor TPN labs on Mon/Thurs   Herby Abraham, Pharm.D Use secure chat for questions 07/27/2023 8:48 AM

## 2023-07-28 DIAGNOSIS — K311 Adult hypertrophic pyloric stenosis: Secondary | ICD-10-CM | POA: Diagnosis not present

## 2023-07-28 LAB — COMPREHENSIVE METABOLIC PANEL
ALT: 17 U/L (ref 0–44)
AST: 23 U/L (ref 15–41)
Albumin: 3.1 g/dL — ABNORMAL LOW (ref 3.5–5.0)
Alkaline Phosphatase: 122 U/L (ref 38–126)
Anion gap: 9 (ref 5–15)
BUN: 29 mg/dL — ABNORMAL HIGH (ref 8–23)
CO2: 25 mmol/L (ref 22–32)
Calcium: 9.1 mg/dL (ref 8.9–10.3)
Chloride: 97 mmol/L — ABNORMAL LOW (ref 98–111)
Creatinine, Ser: 0.78 mg/dL (ref 0.44–1.00)
GFR, Estimated: >60 mL/min (ref >60)
Glucose, Bld: 152 mg/dL — ABNORMAL HIGH (ref 70–99)
Potassium: 4.2 mmol/L (ref 3.5–5.1)
Sodium: 131 mmol/L — ABNORMAL LOW (ref 135–145)
Total Bilirubin: 0.5 mg/dL (ref 0.3–1.2)
Total Protein: 6.5 g/dL (ref 6.5–8.1)

## 2023-07-28 LAB — PHOSPHORUS: Phosphorus: 4 mg/dL (ref 2.5–4.6)

## 2023-07-28 LAB — GLUCOSE, CAPILLARY
Glucose-Capillary: 156 mg/dL — ABNORMAL HIGH (ref 70–99)
Glucose-Capillary: 173 mg/dL — ABNORMAL HIGH (ref 70–99)
Glucose-Capillary: 147 mg/dL — ABNORMAL HIGH (ref 70–99)
Glucose-Capillary: 161 mg/dL — ABNORMAL HIGH (ref 70–99)
Glucose-Capillary: 155 mg/dL — ABNORMAL HIGH (ref 70–99)
Glucose-Capillary: 185 mg/dL — ABNORMAL HIGH (ref 70–99)

## 2023-07-28 LAB — MAGNESIUM: Magnesium: 1.9 mg/dL (ref 1.7–2.4)

## 2023-07-28 LAB — TRIGLYCERIDES: Triglycerides: 100 mg/dL (ref <150)

## 2023-07-28 IMAGING — DX DG CHEST 2V
2 series · 2 of 2 positions shown · non-contrast
Comparison: 12/30/2021 and prior studies

CLINICAL DATA: Leukocytosis.  Recent abdominal surgery.

EXAM:
CHEST - 2 VIEW

[chest pa]
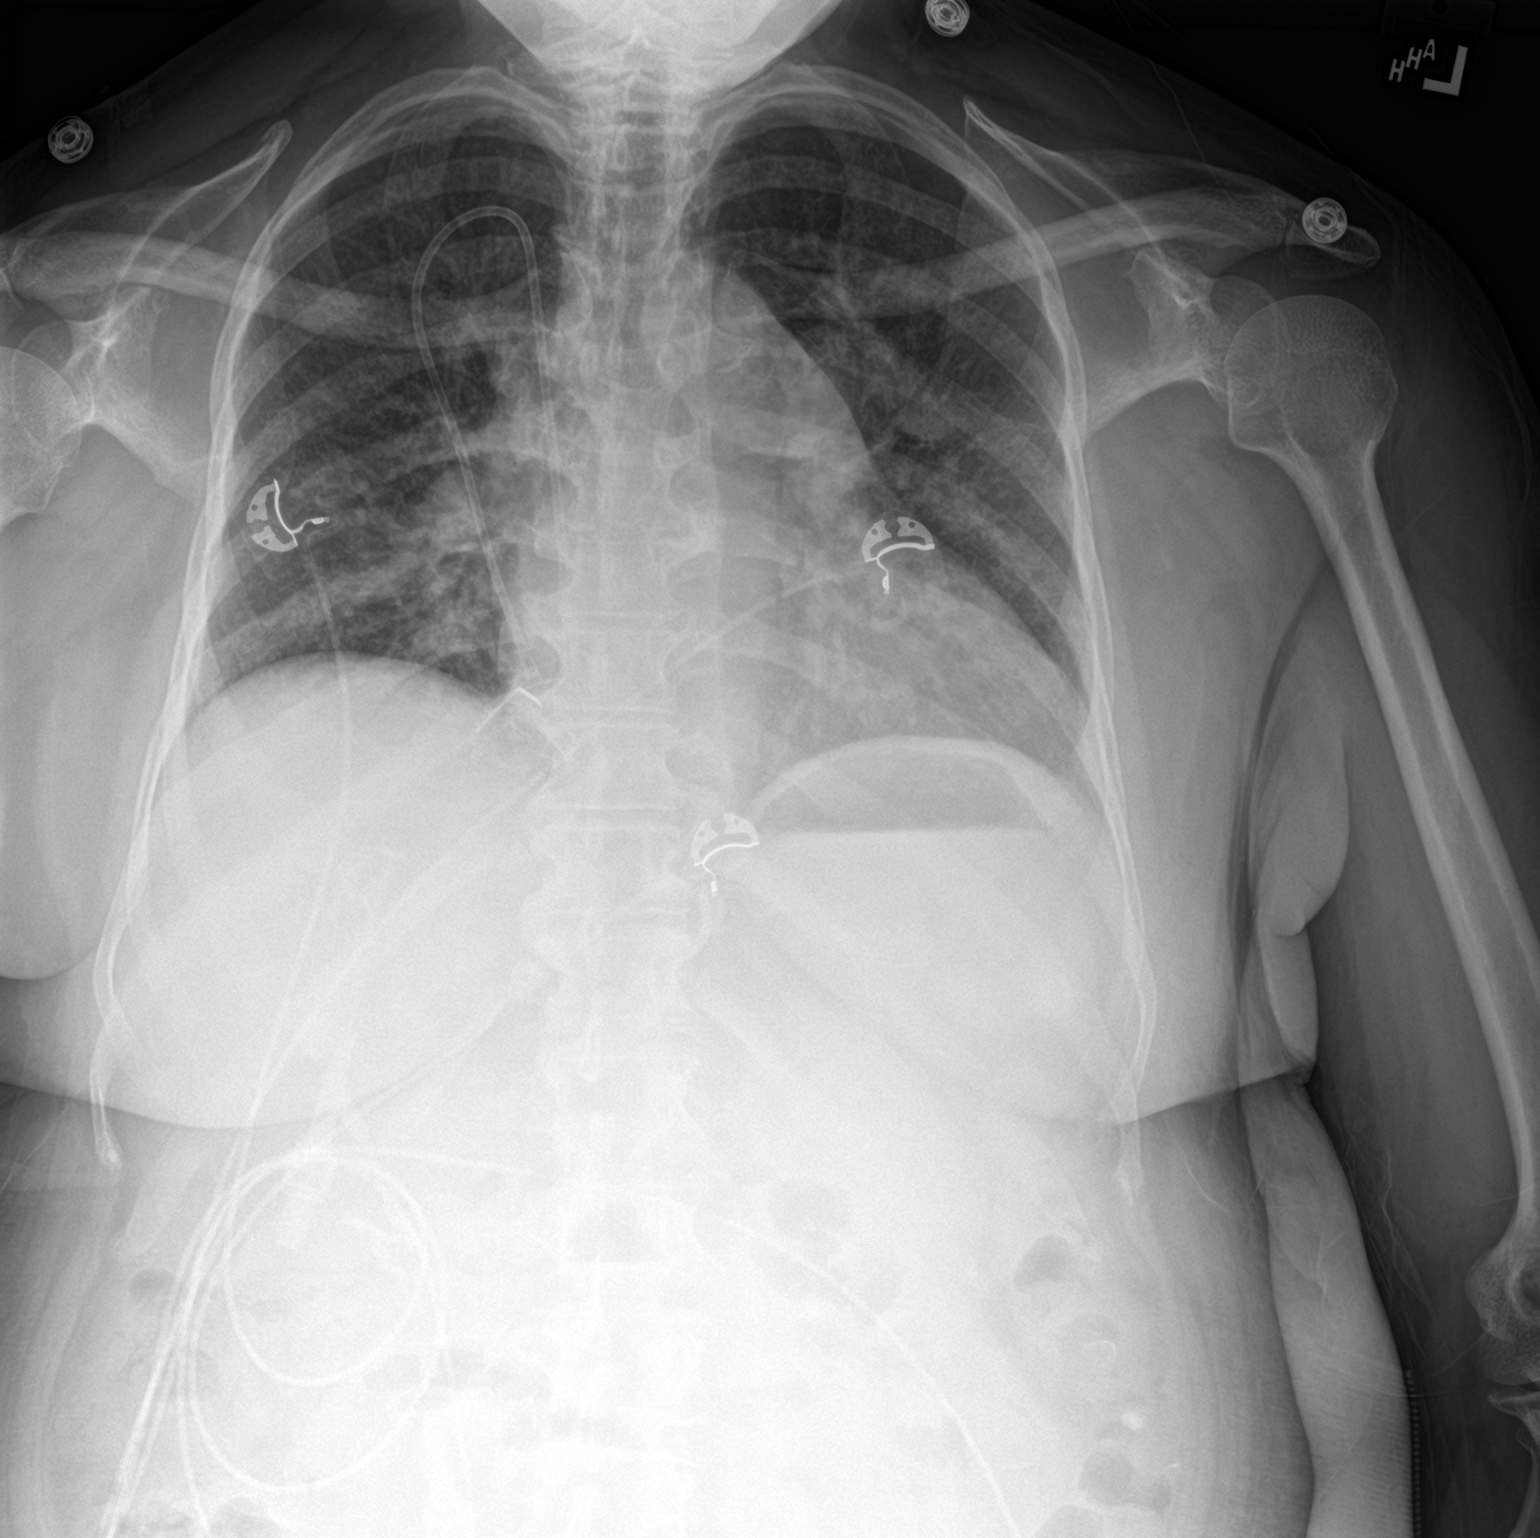

[chest lat]
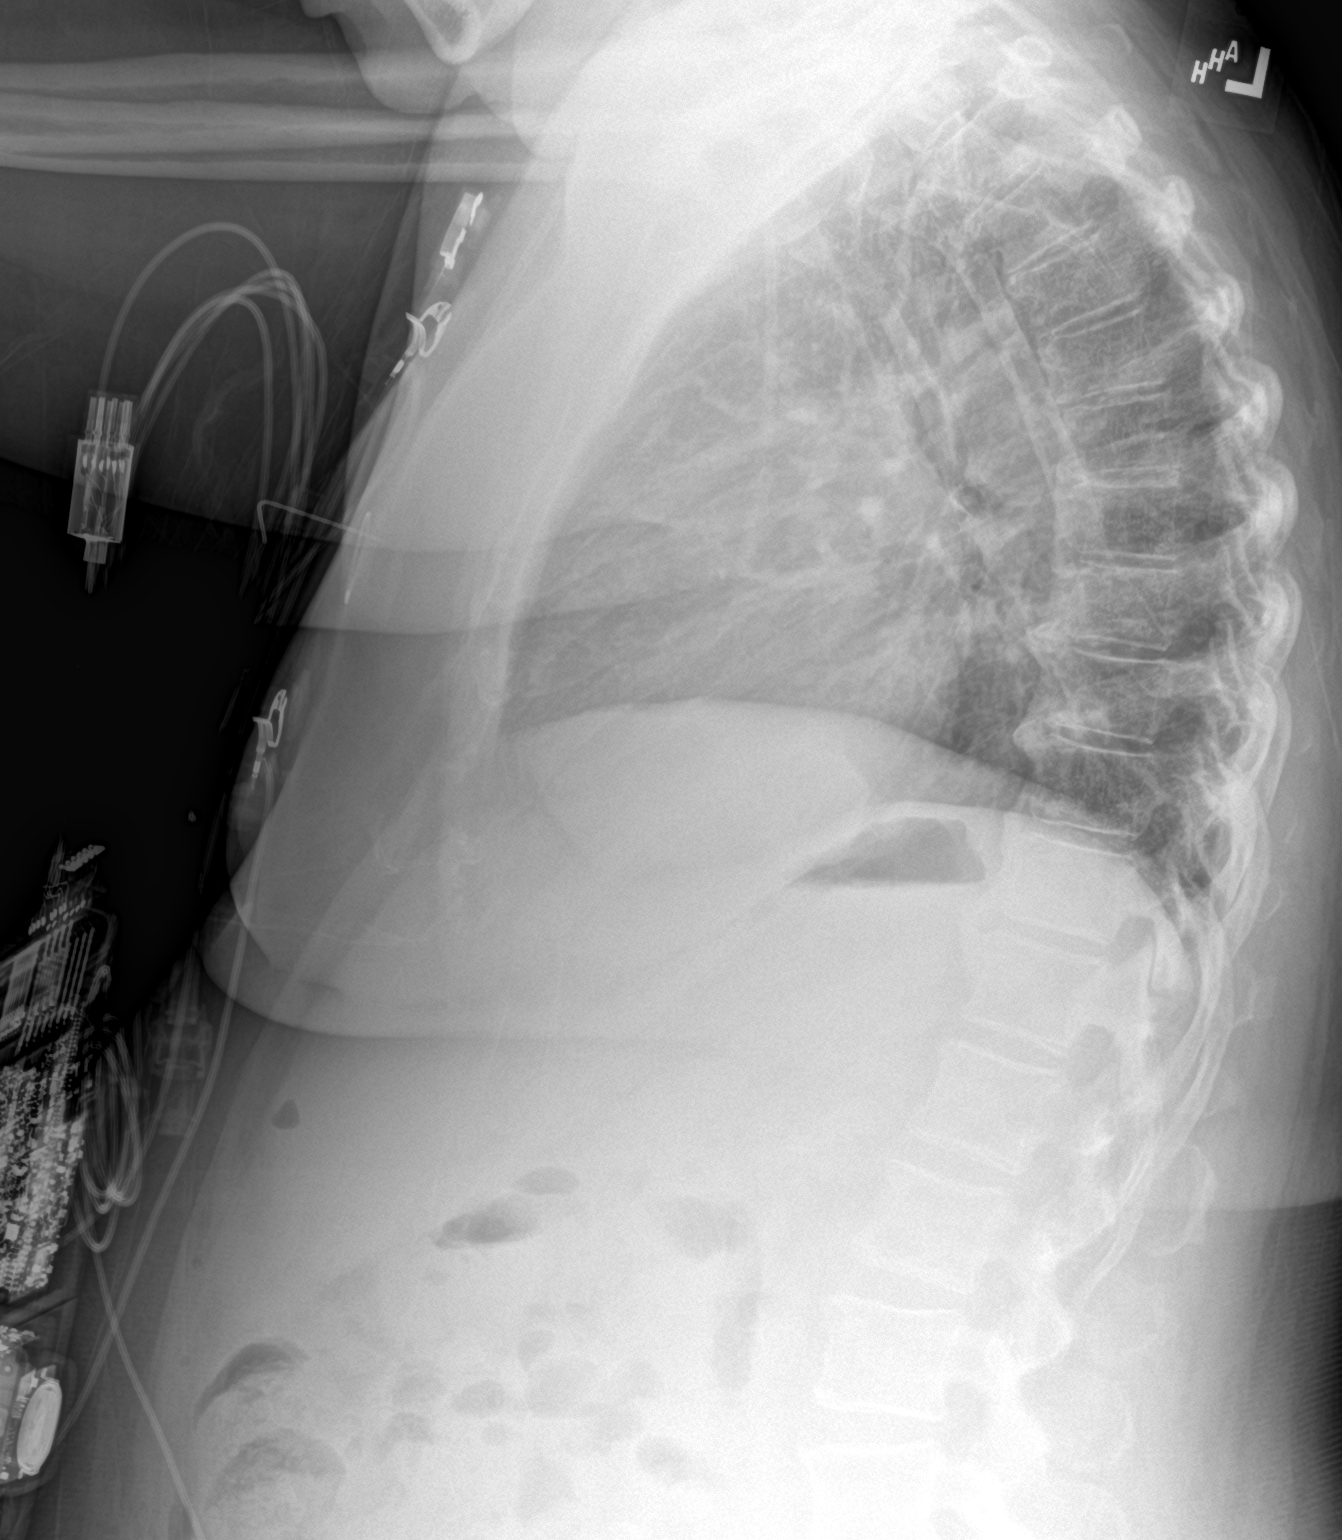

[2 of 2 positions shown; findings below may reference images not displayed]

FINDINGS: Cardiomediastinal silhouette is unchanged.

A RIGHT Port-A-Cath is present with tip overlying the UPPER RIGHT
atrium.

This is a low volume study with peribronchial thickening bilaterally
noted.

No definite airspace disease, pleural effusion or pneumothorax
noted.

No significant abnormalities identified in the visualized UPPER
abdomen. There is no evidence of gross pneumoperitoneum.
IMPRESSION: Low volume study with peribronchial thickening bilaterally. No
definite airspace disease.

## 2023-07-28 MED ORDER — GUAIFENESIN-DM 100-10 MG/5ML PO SYRP
5.0000 mL | ORAL_SOLUTION | ORAL | Status: DC | PRN
  Administered 2023-07-28 – 2023-08-05 (×7): 5 mL via ORAL
  Filled 2023-07-28 (×7): qty 5

## 2023-07-28 MED ORDER — MAGNESIUM SULFATE IN D5W 1-5 GM/100ML-% IV SOLN
1.0000 g | Freq: Once | INTRAVENOUS | Status: AC
  Administered 2023-07-28: 1 g via INTRAVENOUS
  Filled 2023-07-28: qty 100

## 2023-07-28 MED ORDER — TRAVASOL 10 % IV SOLN
INTRAVENOUS | Status: AC
  Filled 2023-07-28: qty 900

## 2023-07-28 NOTE — Progress Notes (Signed)
Sherri Gill   DOB:1951-09-09   ZO#:[id number]    ASSESSMENT & PLAN:   Recurrent uterine cancer She presents with partial small bowel obstruction She tolerated chemotherapy with carboplatin and paclitaxel 07/23/2023 In the meantime, continue supportive care   Nausea, vomiting and dehydration Feeling better after NG tube decompression I recommend trial of clamping NGT again If successful without nausea vomiting for 3 days, we can remove her NG tube.  However, if she continues to have recurrent nausea vomiting with clamping, the plan will be to switch over to a venting gastrostomy tube   Anemia chronic disease She is not symptomatic Observe only   Dehydration, mild hypokalemia and other electrolyte imbalance, severe malnutrition Anticipate prolonged ileus and the patient is already malnourished Agree with TPN   Discharge planning Unknown   All questions were answered. The patient knows to call the clinic with any problems, questions or concerns.   The total time spent in the appointment was 40 minutes encounter with patients including review of chart and various tests results, discussions about plan of care and coordination of care plan  Artis Delay, MD 07/28/2023 7:34 AM  Subjective:  The patient had increased secretions over the weekend.  She was diagnosed with thrush.  She is doing fine with intermittent suction at nighttime.  Had bowel movement this morning.  Pain is well-controlled. We discussed plan of care  Objective:  Vitals:   07/28/23 0003 07/28/23 0458  BP: 130/65 139/63  Pulse: 94 78  Resp: 16 14  Temp: 98.5 F (36.9 C) 98.1 F (36.7 C)  SpO2: 99% 98%     Intake/Output Summary (Last 24 hours) at 07/28/2023 0734 Last data filed at 07/28/2023 0406 Gross per 24 hour  Intake 1840.41 ml  Output 1900 ml  Net -59.59 ml    GENERAL:alert, no distress and comfortable    Labs:  Recent Labs    07/26/23 0435 07/27/23 0736 07/28/23 0515  NA 135 132* 131*  K  4.0 4.1 4.2  CL 102 97* 97*  CO2 26 26 25   GLUCOSE 134* 127* 152*  BUN 14 20 29*  CREATININE 0.70 0.76 0.78  CALCIUM 8.6* 9.2 9.1  GFRNONAA >60 >60 >60  PROT 5.8* 6.4* 6.5  ALBUMIN 2.8* 3.1* 3.1*  AST 12* 16 23  ALT 9 14 17   ALKPHOS 97 109 122  BILITOT 0.5 0.5 0.5    Studies:  DG Abd 2 Views  Result Date: 07/27/2023 CLINICAL DATA:  Abdominal pain EXAM: ABDOMEN - 2 VIEW COMPARISON:  07/22/2023 FINDINGS: Gastric catheter is again seen in the stomach. No free air is noted. Right chest wall port is seen. Contrast material is noted in multiple colonic diverticula. A few mildly prominent loops of small bowel are noted in the central abdomen. This is somewhat increased when compare with the prior CT of 07/12/2023. Bony abnormality is noted. IMPRESSION: Slight increase in central small bowel dilatation. CT may be helpful for further evaluation. Electronically Signed   By: Alcide Clever M.D.   On: 07/27/2023 20:13   DG Abd 1 View  Result Date: 07/22/2023 CLINICAL DATA:  [id number] Encounter for nasogastric (NG) tube placement [id number]. EXAM: ABDOMEN - 1 VIEW COMPARISON:  Abdominal radiograph 07/19/2023. FINDINGS: Right chest Port-A-Cath tip projects over the right atrium. Enteric tube tip and side port project over the stomach. Scant retained contrast in visualized portions of the colon. IMPRESSION: Enteric tube tip and side port project over the stomach. Electronically Signed   By: Orvan Falconer  M.D.   On: 07/22/2023 14:27   DG Abd 1 View  Result Date: 07/19/2023 CLINICAL DATA:  [id number] SBO (small bowel obstruction) (HCC) [id number] EXAM: ABDOMEN - 1 VIEW COMPARISON:  July 18, 2023 FINDINGS: Enteric contrast has progressed to the rectum. Multiple colonic diverticuli. No dilated loops of bowel are seen. IMPRESSION: Enteric contrast has progressed to the rectum. Electronically Signed   By: Meda Klinefelter M.D.   On: 07/19/2023 10:22   DG UGI W SINGLE CM (SOL OR THIN BA)  Result Date:  07/18/2023 CLINICAL DATA:  Endometrial/uterine cancer. Fold thickening in the stomach on recent endoscopy. Reflux and bloating. Difficulty keeping foods and liquids down. EXAM: UPPER GI SERIES WITH KUB TECHNIQUE: After obtaining a scout radiograph a routine upper GI series was performed using thin barium. Mobility was mildly limited on today's exam, for example the patient was unable to turn prone. FLUOROSCOPY: Radiation Exposure Index (as provided by the fluoroscopic device): 21.7 mGy Kerma COMPARISON:  CT abdomen 07/12/2023 FINDINGS: Initial KUB unremarkable aside from mild levoconvex lumbar scoliosis. The pharyngeal phase of swallowing appears normal. Primary peristaltic waves were intact on 4/4 swallows. Small type 1 hiatal hernia. Occasional gastroesophageal reflux was observed on today's exam. I used mucosal relief and balloon compression of the stomach in order to help mitigate the use of thin barium and single contrast technique as well as the patient is inability to perform prone imaging. These demonstrate a gastric fold thickening as shown on prior CT and prior endoscopy. The duodenum is mildly dilated, especially proximally. Eventually this proceeded on to the fourth portion of the duodenum as shown on image 1 series 15. However, the contrast column was sluggish to flow through the duodenum, and given the somewhat dilated appearance, the possibility of obstruction distally is not excluded. Consider following the barium column with serial KUB use in assessing for small bowel obstruction in several hours. A 13 mm barium tablet passed briskly into the stomach. Port-A-Cath noted. IMPRESSION: 1. Gastric fold thickening as shown on prior CT and endoscopy. 2. Small type 1 hiatal hernia. 3. Intermittent gastroesophageal reflux. 4. Mildly dilated duodenum, with sluggish flow of contrast through the duodenum. The possibility of a more distal small bowel obstruction is not excluded given this appearance. Consider  KUB follow-up in several hours to assess for transit of the barium column through the small bowel in assessing for small bowel obstruction. Electronically Signed   By: Gaylyn Rong M.D.   On: 07/18/2023 12:00   DG Abd Portable 1 View  Result Date: 07/25/2023 CLINICAL DATA:  Nasogastric tube placement. EXAM: PORTABLE ABDOMEN - 1 VIEW COMPARISON:  Abdomen and pelvis CT dated 07/12/2023. FINDINGS: Normal bowel-gas pattern. Interval nasogastric tube with its tip and side hole in the proximal to mid stomach. Lower thoracic spine degenerative changes. IMPRESSION: Nasogastric tube tip and side hole in the proximal to mid stomach. Electronically Signed   By: Beckie Salts M.D.   On: 07/13/2023 17:09   DG Abdomen Acute W/Chest  Result Date: 07/05/2023 CLINICAL DATA:  Vomiting EXAM: DG ABDOMEN ACUTE WITH 1 VIEW CHEST COMPARISON:  07/10/2023. FINDINGS: There is no evidence of dilated bowel loops or free intraperitoneal air. No radiopaque calculi or other significant radiographic abnormality is seen. Heart size and mediastinal contours are within normal limits. Both lungs are clear. There are thoracic degenerative changes. Right-sided Port-A-Cath tip overlies distal SVC. IMPRESSION: Negative abdominal radiographs.  No acute cardiopulmonary disease. Electronically Signed   By: Layla Maw M.D.   On:  2023-08-03 14:40   CT ABDOMEN PELVIS W CONTRAST  Result Date: 07/12/2023 CLINICAL DATA:  Endometrial/uterine cancer.  Nausea and vomiting. * Tracking Code: BO * EXAM: CT ABDOMEN AND PELVIS WITH CONTRAST TECHNIQUE: Multidetector CT imaging of the abdomen and pelvis was performed using the standard protocol following bolus administration of intravenous contrast. RADIATION DOSE REDUCTION: This exam was performed according to the departmental dose-optimization program which includes automated exposure control, adjustment of the mA and/or kV according to patient size and/or use of iterative reconstruction technique.  CONTRAST:  OMNIPAQUE IOHEXOL 300 MG/ML  SOLN COMPARISON:  CT abdomen/pelvis from 06/17/2023 FINDINGS: Lower chest: A catheter terminates in the right atrium on the top most image. Mild cardiomegaly. Hepatobiliary: Unremarkable Pancreas: Unremarkable Spleen: Unremarkable Adrenals/Urinary Tract: Unremarkable Stomach/Bowel: Diffuse gastric wall thickening with mild gastric and substantial proximal duodenal distension. After crossing the midline, the duodenum does not appear distended but there is substantial distal duodenal and jejunal wall thickening, worsened from prior exams. Scattered colonic diverticula. Appendix unremarkable. No pneumatosis. No hypoenhancement of bowel wall. There are abnormal contains small fluid collections along the small bowel mesentery, for example along the left abdomen on image 64 series 7 and along the right abdomen on image 55 series 7. In addition there is fluid density in the lesser sac on image 76 series 7. Vascular/Lymphatic: Mild atheromatous vascular disease of the aortoiliac tree. Patent celiac trunk and SMA. Patent superior mesenteric vein. Suspected left iliac adenopathy measuring about 1.3 cm in diameter on image 47 series 2, stable. This is of similar density to the adjacent vein. Reproductive: Uterus absent. Nodular enhancement along the left vaginal cuff measuring 2.5 by 1.7 cm, previously 2.3 by 1.7 cm by my measurements. Tumor in this location is not excluded. Other: There is some abnormal stranding along the remaining omentum for example on image 73 series 9. Small nodular deposit along the upper omentum on image 31 series 2. Musculoskeletal: Lower thoracic and lumbar spondylosis. Density within the umbilicus on image 102 series 9, potentially a small knuckle of bowel versus tumor. IMPRESSION: 1. Worsening of gastric wall thickening, distal duodenal wall thickening, and jejunal wall thickening. The stomach and proximal duodenum are distended. The appearance favors  gastroenteritis. 2. There is also some abnormal stranding along the remaining omentum. There are also abnormal fluid collections along the small bowel mesentery and in the lesser sac. The appearance raises concern for possible residual peritoneal tumor causing fluid deposits. Small nodular deposit along the upper omentum along with nodularity in the umbilicus. 3. Nodular enhancement along the left vaginal cuff measuring 2.5 by 1.7 cm, previously 2.3 by 1.7 cm by my measurements. This likely represents tumor. 4. Suspected left iliac adenopathy measuring about 1.3 cm in diameter, stable. 5. Mild cardiomegaly. 6. Scattered colonic diverticula. 7. Mild atheromatous vascular disease of the aortoiliac tree. 8. Lower thoracic and lumbar spondylosis. Electronically Signed   By: Gaylyn Rong M.D.   On: 07/12/2023 14:56   DG Abd 1 View  Result Date: 07/10/2023 CLINICAL DATA:  Abdominal pain, vomiting. History of uterine cancer. EXAM: ABDOMEN - 1 VIEW COMPARISON:  April 09, 2022. FINDINGS: The bowel gas pattern is normal. Surgical clip is seen over the left sacrum. No radio-opaque calculi or other significant radiographic abnormality are seen. IMPRESSION: No abnormal bowel dilatation. Electronically Signed   By: Lupita Raider M.D.   On: 07/10/2023 14:18

## 2023-07-28 NOTE — Progress Notes (Signed)
HOSPITALIST ROUNDING NOTE Sherri Gill:[id number]  DOB: 1950-11-23  DOA: 07/21/2023  PCP: [name], MD  07/28/2023,11:47 AM   LOS: 14 days      Code Status: Full    From:      Current Dispo:      72 year old black female Prior HSV meningitis in 2015 DM TY 2 HTN HLD IDA Paroxysmal A-fib on Xarelto Metastatic serous carcinoma of uterus status post omental BH TSO Dr. [name] Seen several times over the past 2 weeks for poor p.o. intake with constant nausea vomiting + constipation and straining despite MiraLAX senna  Seen in ED 07/12/2023 for symptoms CT ABD = gastric wall thickening distal duodenal wall thickening and junctional wall thickening with stomach and proximal duodenal distention?  Residual peritoneal tumor-patient was able to tolerate food and was discharged home  Re-presented 07/06/2023 to the med center at [facility] Pulse 111 Tmax 97 WBC 10.6 BUN/creatinine 24/1.2 acute abdominal x-rays negative-NG tube was placed secondary to possible malignant stricture of proximal small bowel GI Dr. [name] consulted 2/2?  Need upper endoscopy patient kept n.p.o. .  9/18 EGD-patent esophagus --push enteroscopy ruled out gastric outlet obstruction with thickened gastric folds 9/20 upper GI series KUB contrast progressing to rectum--- despite this, starting Reglan still nauseous 9/21 [facility] signed off 9/24 NG tube reinserted 9/25 resumption chemo paclitaxel carboplatin and an attempt to lower carcinomatosis burden 9/26 clamping trials 9/29 clamping trials failed placed back on low intermittent wall suction keep n.p.o. for now 9/30 clamping trials resumed  Plan   Carcinomatosis causing delayed gastric motility -small bowel obstruction ruled out as above with endoscopy, upper GI series Attempt again clamping trials ice chips only for now large stool today passing flatus feels better--- continues TPN for now Continues Reglan 10 Q8, Compazine 5 every 6 refractory  nausea, Pepcid 20 daily at bedtime Zofran 4 every 6 as needed and pantoprazole 40 twice daily  Metastatic serous carcinoma of uterus Resumed 9/25 Rx carboplatin paclitaxel-rest as per Dr. [name] Hopeful for recovery of bowel function  Paroxysmal A-fib CHADVASC >4 on Xarelto continue Xarelto orally This admission added metoprolol 12.5 XL scheduled, continue Cardizem 30 every 6 as cannot give long-acting formulation metoprolol IV for heart rate above 120--continues to remain stable  Probable oropharyngeal thrush 9/29 Micafungin + nystatin swish for at least 7 days 10/5.  HTN  Resume losartan at lower dose of 25 given uncontrolled nature of blood pressure Discontinue hydralazine  DM TY 2 now n.p.o. A1c recently 6.4 Hold metformin 1000 twice daily until reliably taking p.o. CBGs overall below 180  Hypothyroid Resume Synthroid 50 mcg   AKI, hypokalemia-treated-periodic labs Resolving is on TPN-periodic labs Becoming slightly azotemic-monitor may need to increase water component, sodium component of TPN  Normocytic anemia query B12 deficiency Check CBC in a.m.  DVT prophylaxis: Xarelto p.o.  Status is: Inpatient Remains inpatient appropriate because:   Requires resolution of issues      Subjective:  Large stool today passing flatus belly softer Sore throat better  Objective + exam Vitals:   07/27/23 1246 07/27/23 1952 07/28/23 0003 07/28/23 0458  BP: (!) 145/70 137/66 130/65 139/63  Pulse: 79 92 94 78  Resp: 16 16 16 14   Temp: 98 F (36.7 C) 98.1 F (36.7 C) 98.5 F (36.9 C) 98.1 F (36.7 C)  TempSrc: Oral Oral Oral Oral  SpO2: 99% 99% 99% 98%  Weight:    78.5 kg  Height:       Filed  Weights   07/23/23 0422 07/26/23 0417 07/28/23 0458  Weight: 82.2 kg 82.2 kg 78.5 kg    Examination:  EOMI NCAT no focal deficit no icterus no pallor Chest clear no added sound Slight sinus, sinus tach Abdomen much softer than prior No lower extremity edema TPN  running   Data Reviewed: reviewed   CBC    Component Value Date/Time   WBC 6.9 07/23/2023 0500   RBC 2.97 (L) 07/23/2023 0500   HGB 8.1 (L) 07/23/2023 0500   HGB 10.1 (L) 07/10/2023 1331   HGB 9.8 (L) 03/25/2023 1541   HCT 26.2 (L) 07/23/2023 0500   HCT 31.5 (L) 03/25/2023 1541   PLT 253 07/23/2023 0500   PLT 384 07/10/2023 1331   PLT 237 03/25/2023 1541   MCV 88.2 07/23/2023 0500   MCV 84 03/25/2023 1541   MCH 27.3 07/23/2023 0500   MCHC 30.9 07/23/2023 0500   RDW 15.6 (H) 07/23/2023 0500   RDW 17.0 (H) 03/25/2023 1541   LYMPHSABS 0.9 07/23/2023 0500   LYMPHSABS 2.8 05/31/2021 1531   MONOABS 0.3 07/23/2023 0500   EOSABS 0.0 07/23/2023 0500   EOSABS 0.3 05/31/2021 1531   BASOSABS 0.0 07/23/2023 0500   BASOSABS 0.0 05/31/2021 1531      Latest Ref Rng & Units 07/28/2023    5:15 AM 07/27/2023    7:36 AM 07/26/2023    4:35 AM  CMP  Glucose 70 - 99 mg/dL 409  811  914   BUN 8 - 23 mg/dL 29  20  14    Creatinine 0.44 - 1.00 mg/dL 7.82  9.56  2.13   Sodium 135 - 145 mmol/L 131  132  135   Potassium 3.5 - 5.1 mmol/L 4.2  4.1  4.0   Chloride 98 - 111 mmol/L 97  97  102   CO2 22 - 32 mmol/L 25  26  26    Calcium 8.9 - 10.3 mg/dL 9.1  9.2  8.6   Total Protein 6.5 - 8.1 g/dL 6.5  6.4  5.8   Total Bilirubin 0.3 - 1.2 mg/dL 0.5  0.5  0.5   Alkaline Phos 38 - 126 U/L 122  109  97   AST 15 - 41 U/L 23  16  12    ALT 0 - 44 U/L 17  14  9       Scheduled Meds:  Chlorhexidine Gluconate Cloth  6 each Topical Daily   diltiazem  30 mg Oral Q6H   dorzolamide-timolol  1 drop Both Eyes BID   insulin aspart  0-9 Units Subcutaneous Q4H   latanoprost  1 drop Both Eyes QHS   levothyroxine  50 mcg Oral Q0600   losartan  25 mg Oral QHS   metoprolol succinate  12.5 mg Oral Daily   nystatin  5 mL Oral QID   pantoprazole (PROTONIX) IV  40 mg Intravenous BID AC   rivaroxaban  20 mg Per Tube Daily   sodium chloride flush  10-40 mL Intracatheter Q12H   thiamine (VITAMIN B1) injection  100 mg  Intravenous Q24H   Continuous Infusions:  famotidine (PEPCID) IV Stopped (07/27/23 2250)   micafungin (MYCAMINE) 100 mg in sodium chloride 0.9 % 100 mL IVPB Stopped (07/27/23 1904)   TPN ADULT (ION) 75 mL/hr at 07/28/23 0335   TPN ADULT (ION)      Time 25  Rhetta Mura, MD  [facility]

## 2023-07-28 NOTE — Progress Notes (Signed)
PHARMACY - TOTAL PARENTERAL NUTRITION CONSULT NOTE   Indication: Prolonged ileus  Patient Measurements: Height: 5\' 3"  (160 cm) Weight: 78.5 kg (173 lb 1 oz) IBW/kg (Calculated) : 52.4 TPN AdjBW (KG): 59.5 Body mass index is 30.66 kg/m.  Assessment:  [name] admitted 9/16 for nausea/vomiting x 2.5 weeks. Patient with h/o recurrent uterine cancer--Bx taken during 9/18 small bowel enteroscopy but no signs of malignant obstruction, negative for malignancy. Oncology recommended resumption of chemotherapy during this admission--carboplatin and paclitaxel completed 9/25.   Patient has severe malnutrition given inability to keep food down with continued N/V during this admission and PTA. Pharmacy consulted for TPN management.  - Carcinomatosis causing delayed gastric motility   Glucose / Insulin: Hx of T2DM - sSSI q4h (used 6 units since TPN rate increased to 75 ml/hr on 9/29 at 6p; est ~12 units in 24 hrs) - CBGs (goal 60-150): 152-173 Electrolytes: Na low 131, Mag 1.9, CL slightly low 97, phos trending up; other lytes wnl Renal: Scr <1; BUN slightly elevated 29 Hepatic: - LFTs, Tbili, Alk phos all WNL - Albumin low Intake / Output; MIVF:  - I/O: - 60 mL - UOP: 7x - Emesis/NG output: 1900 ml/24 hrs - Last BM: 9/30 - 9/27 am recurrent nausea after NGT clamped.  - 9/28 PM nausea requiring unclamping of NGT & back to LIWS GI Imaging: - 9/20 DG UGI: Gastric fold thickening as shown on prior CT and endoscopy. Mildly dilated duodenum, with sluggish flow of contrast through the duodenum--possibility of a more distal SBO is not excluded given this appearance. - 9/29 abd Xray: Slight increase in central small bowel dilatation.  GI Surgeries / Procedures:  - 9/18: small bowel endoscopy: Normal appearing, widely patent esophagus and GEJ. Edematous gastric folds-biopsied. A single duodenal polyp in the post-bulbar duodenum-biopsied  Central access: port TPN start date:  9/26  Nutritional  Goals: Goal TPN rate is 75 mL/hr (provides 90 g of protein and 1746 kcals per day)  RD Assessment: Estimated Needs Total Energy Estimated Needs: 1650-1850 Total Protein Estimated Needs: 85-100g Total Fluid Estimated Needs: 1.8L/day  Current Nutrition:  - TPN  NPO Ice chips and sips with meds  Plan:   Now: - Magnesium sulfate 1gm IV x1  Today, at 1800:  - Continue TPN at goal rate of 75 mL/hr - Electrolytes in TPN:  Increase Na to 150 mEq/L K 50Eq/L  Ca 95mEq/L Mg 25mEq/L Decrease Phos to 5 mmol/L Cl:Ac 2:1 - Add standard MVI and trace elements to TPN - continue Sensitive q4h SSI and adjust as needed  -  thiamine 100mg  daily x5 days (9/26-9/30) - Monitor TPN labs on Mon/Thurs - bmet, phos and mag on 10/1   Dorna Leitz, PharmD, BCPS 07/28/2023 7:21 AM

## 2023-07-29 DIAGNOSIS — K311 Adult hypertrophic pyloric stenosis: Secondary | ICD-10-CM | POA: Diagnosis not present

## 2023-07-29 LAB — CBC WITH DIFFERENTIAL/PLATELET
Abs Immature Granulocytes: 0.06 10*3/uL (ref 0.00–0.07)
Basophils Absolute: 0 10*3/uL (ref 0.0–0.1)
Basophils Relative: 1 %
Eosinophils Absolute: 0.1 10*3/uL (ref 0.0–0.5)
Eosinophils Relative: 1 %
HCT: 30.2 % — ABNORMAL LOW (ref 36.0–46.0)
Hemoglobin: 9.3 g/dL — ABNORMAL LOW (ref 12.0–15.0)
Immature Granulocytes: 1 %
Lymphocytes Relative: 9 %
Lymphs Abs: 0.4 10*3/uL — ABNORMAL LOW (ref 0.7–4.0)
MCH: 27.6 pg (ref 26.0–34.0)
MCHC: 30.8 g/dL (ref 30.0–36.0)
MCV: 89.6 fL (ref 80.0–100.0)
Monocytes Absolute: 0.1 10*3/uL (ref 0.1–1.0)
Monocytes Relative: 2 %
Neutro Abs: 4.1 10*3/uL (ref 1.7–7.7)
Neutrophils Relative %: 86 %
Platelets: 166 10*3/uL (ref 150–400)
RBC: 3.37 MIL/uL — ABNORMAL LOW (ref 3.87–5.11)
RDW: 15.8 % — ABNORMAL HIGH (ref 11.5–15.5)
WBC: 4.7 10*3/uL (ref 4.0–10.5)
nRBC: 0 % (ref 0.0–0.2)

## 2023-07-29 LAB — GLUCOSE, CAPILLARY
Glucose-Capillary: 157 mg/dL — ABNORMAL HIGH (ref 70–99)
Glucose-Capillary: 159 mg/dL — ABNORMAL HIGH (ref 70–99)
Glucose-Capillary: 162 mg/dL — ABNORMAL HIGH (ref 70–99)
Glucose-Capillary: 173 mg/dL — ABNORMAL HIGH (ref 70–99)
Glucose-Capillary: 174 mg/dL — ABNORMAL HIGH (ref 70–99)
Glucose-Capillary: 185 mg/dL — ABNORMAL HIGH (ref 70–99)

## 2023-07-29 LAB — BASIC METABOLIC PANEL
Anion gap: 9 (ref 5–15)
BUN: 33 mg/dL — ABNORMAL HIGH (ref 8–23)
CO2: 22 mmol/L (ref 22–32)
Calcium: 8.9 mg/dL (ref 8.9–10.3)
Chloride: 100 mmol/L (ref 98–111)
Creatinine, Ser: 0.63 mg/dL (ref 0.44–1.00)
GFR, Estimated: >60 mL/min (ref >60)
Glucose, Bld: 180 mg/dL — ABNORMAL HIGH (ref 70–99)
Potassium: 4.4 mmol/L (ref 3.5–5.1)
Sodium: 131 mmol/L — ABNORMAL LOW (ref 135–145)

## 2023-07-29 LAB — MAGNESIUM: Magnesium: 1.6 mg/dL — ABNORMAL LOW (ref 1.7–2.4)

## 2023-07-29 LAB — PHOSPHORUS: Phosphorus: 2.6 mg/dL (ref 2.5–4.6)

## 2023-07-29 MED ORDER — SODIUM CHLORIDE 0.9 % IV SOLN: INTRAVENOUS | Status: DC

## 2023-07-29 MED ORDER — TRAVASOL 10 % IV SOLN
INTRAVENOUS | Status: AC
  Filled 2023-07-29: qty 900

## 2023-07-29 MED ORDER — MAGNESIUM SULFATE 2 GM/50ML IV SOLN
2.0000 g | Freq: Once | INTRAVENOUS | Status: AC
  Administered 2023-07-29: 2 g via INTRAVENOUS
  Filled 2023-07-29: qty 50

## 2023-07-29 NOTE — Progress Notes (Signed)
PHARMACY - TOTAL PARENTERAL NUTRITION CONSULT NOTE   Indication: Prolonged ileus  Patient Measurements: Height: 5\' 3"  (160 cm) Weight: (P) 79.6 kg (175 lb 7.8 oz) IBW/kg (Calculated) : 52.4 TPN AdjBW (KG): 59.5 Body mass index is 31.09 kg/m (pended).  Assessment:  [name] admitted 9/16 for nausea/vomiting x 2.5 weeks. Patient with h/o recurrent uterine cancer--Bx taken during 9/18 small bowel enteroscopy but no signs of malignant obstruction, negative for malignancy. Oncology recommended resumption of chemotherapy during this admission--carboplatin and paclitaxel completed 9/25.   Patient has severe malnutrition given inability to keep food down with continued N/V during this admission and PTA. Pharmacy consulted for TPN management.  - Carcinomatosis causing delayed gastric motility   Glucose / Insulin: Hx of T2DM - sSSI q4h (used 11 units in 24 hrs) - CBGs (goal 60-150): 155-185 Electrolytes: Na low 131 (max Na conc in TPN), Mag 1.6, phos down 2.6 after decreasing conc in TPN bag on 9/30; other lytes wnl Renal: Scr <1; BUN slightly elevated 29 Hepatic: - LFTs, Tbili, Alk phos all WNL - Albumin low Intake / Output; MIVF:  - I/O: + 1030 mL - UOP: 4x - Last BM: 9/30 - 9/27 am recurrent nausea after NGT clamped.  - 9/28 PM nausea requiring unclamping of NGT & back to LIWS - 9/30: resumed clamping trial GI Imaging: - 9/20 DG UGI: Gastric fold thickening as shown on prior CT and endoscopy. Mildly dilated duodenum, with sluggish flow of contrast through the duodenum--possibility of a more distal SBO is not excluded given this appearance. - 9/29 abd Xray: Slight increase in central small bowel dilatation.  GI Surgeries / Procedures:  - 9/18: small bowel endoscopy: Normal appearing, widely patent esophagus and GEJ. Edematous gastric folds-biopsied. A single duodenal polyp in the post-bulbar duodenum-biopsied  Central access: port TPN start date:  9/26  Nutritional Goals: Goal  TPN rate is 75 mL/hr (provides 90 g of protein and 1746 kcals per day)  RD Assessment: Estimated Needs Total Energy Estimated Needs: 1650-1850 Total Protein Estimated Needs: 85-100g Total Fluid Estimated Needs: 1.8L/day  Current Nutrition:  - TPN  - completed thiamine 100mg  daily x5 days (9/26-9/30)  NPO Ice chips and sips with meds  Plan:   Now: - Magnesium sulfate 2gm IV x1  Today, at 1800:  - Continue TPN at goal rate of 75 mL/hr - Electrolytes in TPN:  Na 150 mEq/L K 50Eq/L  Ca 71mEq/L Mg 17mEq/L Increase Phos to 7 mmol/L Cl:Ac 2:1 - Add standard MVI and trace elements to TPN - continue Sensitive q4h SSI and adjust as needed  - Monitor TPN labs on Mon/Thurs - bmet, phos and mag on 10/2   Dorna Leitz, PharmD, BCPS 07/29/2023 7:01 AM

## 2023-07-29 NOTE — Progress Notes (Addendum)
HOSPITALIST ROUNDING NOTE Sherri Gill XBM:[id number]  DOB: 11/07/1950  DOA: 2023-07-28  PCP: Sherri Aschoff, MD  07/29/2023,1:36 PM   LOS: 15 days      Code Status: Full    From:      Current Dispo:      72 year old black female Prior HSV meningitis in 2015 DM TY 2 HTN HLD IDA Paroxysmal A-fib on Xarelto Metastatic serous carcinoma of uterus status post omental BH TSO Dr. [name] Seen several times over the past 2 weeks for poor p.o. intake with constant nausea vomiting + constipation and straining despite MiraLAX senna  Seen in ED 07/12/2023 for symptoms CT ABD = gastric wall thickening distal duodenal wall thickening and junctional wall thickening with stomach and proximal duodenal distention?  Residual peritoneal tumor-patient was able to tolerate food and was discharged home  Re-presented 07-28-2023 to the med center at [facility] Pulse 111 Tmax 97 WBC 10.6 BUN/creatinine 24/1.2 acute abdominal x-rays negative-NG tube was placed secondary to possible malignant stricture of proximal small bowel GI Dr. Loreta Gill consulted 2/2?  Need upper endoscopy patient kept n.p.o. .  9/18 EGD-patent esophagus --push enteroscopy ruled out gastric outlet obstruction with thickened gastric folds 9/20 upper GI series KUB contrast progressing to rectum--- despite this, starting Reglan still nauseous 9/21 [facility] signed off 9/24 NG tube reinserted 9/25 resumption chemo paclitaxel carboplatin and an attempt to lower carcinomatosis burden 9/26 clamping trials 9/29 clamping trials failed placed back on low intermittent wall suction keep n.p.o. for now 9/30 clamping trials resumed--has been in ice chiips since--AXR stll shows distension   Plan   Carcinomatosis causing delayed gastric motility -small bowel obstruction ruled out as above with endoscopy, upper GI series Continues on ice chips--stool + today also--would advance diet very slowly--AXR as above---hold rpt Ct for now as improving  some Continues Reglan 10 Q8, Compazine 5 every 6 refractory nausea, Pepcid 20 daily at bedtime Zofran 4 every 6 as needed and pantoprazole 40 twice daily  Sputum-risk aspiration as NG tube in place Get 2 vw cxr --NG tube independent risk factor--watch for s/s of fever chills etc anfd add Abx for empiric coverage if spikes fever  Metastatic serous carcinoma of uterus Resumed 9/25 Rx carboplatin paclitaxel-rest as per Dr. Bertis Gill Hopeful for recovery of bowel function  Paroxysmal A-fib CHADVASC >4 on Xarelto continue Xarelto orally This admission added metoprolol 12.5 XL scheduled, continue Cardizem 30 every 6 as cannot give long-acting formulation metoprolol IV for heart rate above 120--continues to remain stable  Probable oropharyngeal thrush 9/29 Micafungin + nystatin swish for at least 7 days ending 10/5.  HTN  Toprol/cardizem as above  DM TY 2 now n.p.o. A1c recently 6.4 Hold metformin 1000 twice daily until reliably taking p.o. CBGs overall below 180  Hypothyroid Resume Synthroid 50 mcg   AKI, hypokalemia-treated-periodic labs -as above discussion--saline 50 cc/h now--Losartan held given azotemia---labs in am  Normocytic anemia query B12 deficiency Check CBC in a.m.periodic labs  DVT prophylaxis: Xarelto p.o.  Status is: Inpatient Remains inpatient appropriate because:   Requires resolution of issues      Subjective:  Coughing+ +sputum Sore throat as well despite swish swallow No fever no cp  Objective + exam Vitals:   07/28/23 2100 07/29/23 0055 07/29/23 0521 07/29/23 0841  BP: (!) 147/72 129/60 (!) 146/70 137/66  Pulse: 79 70 84 92  Resp: 18  18   Temp: 98 F (36.7 C)  98.3 F (36.8 C)   TempSrc: Oral  Oral  SpO2:   99%   Weight:   (P) 79.6 kg   Height:       Filed Weights   07/26/23 0417 07/28/23 0458 07/29/23 0521  Weight: 82.2 kg 78.5 kg (P) 79.6 kg    Examination:  EOMI NCAT no focal deficit no icterus no pallor Chest clear but slight  decrease AE on R side Slight sinus, sinus tach Abd soft   Data Reviewed: reviewed   CBC    Component Value Date/Time   WBC 4.7 07/29/2023 0624   RBC 3.37 (L) 07/29/2023 0624   HGB 9.3 (L) 07/29/2023 0624   HGB 10.1 (L) 07/10/2023 1331   HGB 9.8 (L) 03/25/2023 1541   HCT 30.2 (L) 07/29/2023 0624   HCT 31.5 (L) 03/25/2023 1541   PLT 166 07/29/2023 0624   PLT 384 07/10/2023 1331   PLT 237 03/25/2023 1541   MCV 89.6 07/29/2023 0624   MCV 84 03/25/2023 1541   MCH 27.6 07/29/2023 0624   MCHC 30.8 07/29/2023 0624   RDW 15.8 (H) 07/29/2023 0624   RDW 17.0 (H) 03/25/2023 1541   LYMPHSABS 0.4 (L) 07/29/2023 0624   LYMPHSABS 2.8 05/31/2021 1531   MONOABS 0.1 07/29/2023 0624   EOSABS 0.1 07/29/2023 0624   EOSABS 0.3 05/31/2021 1531   BASOSABS 0.0 07/29/2023 0624   BASOSABS 0.0 05/31/2021 1531      Latest Ref Rng & Units 07/29/2023    6:24 AM 07/28/2023    5:15 AM 07/27/2023    7:36 AM  CMP  Glucose 70 - 99 mg/dL 409  811  914   BUN 8 - 23 mg/dL 33  29  20   Creatinine 0.44 - 1.00 mg/dL 7.82  9.56  2.13   Sodium 135 - 145 mmol/L 131  131  132   Potassium 3.5 - 5.1 mmol/L 4.4  4.2  4.1   Chloride 98 - 111 mmol/L 100  97  97   CO2 22 - 32 mmol/L 22  25  26    Calcium 8.9 - 10.3 mg/dL 8.9  9.1  9.2   Total Protein 6.5 - 8.1 g/dL  6.5  6.4   Total Bilirubin 0.3 - 1.2 mg/dL  0.5  0.5   Alkaline Phos 38 - 126 U/L  122  109   AST 15 - 41 U/L  23  16   ALT 0 - 44 U/L  17  14      Scheduled Meds:  Chlorhexidine Gluconate Cloth  6 each Topical Daily   diltiazem  30 mg Oral Q6H   dorzolamide-timolol  1 drop Both Eyes BID   insulin aspart  0-9 Units Subcutaneous Q4H   latanoprost  1 drop Both Eyes QHS   levothyroxine  50 mcg Oral Q0600   metoprolol succinate  12.5 mg Oral Daily   nystatin  5 mL Oral QID   pantoprazole (PROTONIX) IV  40 mg Intravenous BID AC   rivaroxaban  20 mg Per Tube Daily   sodium chloride flush  10-40 mL Intracatheter Q12H   Continuous Infusions:  sodium  chloride 50 mL/hr at 07/29/23 1255   famotidine (PEPCID) IV 20 mg (07/28/23 2140)   micafungin (MYCAMINE) 100 mg in sodium chloride 0.9 % 100 mL IVPB 100 mg (07/28/23 1753)   TPN ADULT (ION) 75 mL/hr at 07/28/23 1831   TPN ADULT (ION)      Time 25  Rhetta Mura, MD  [facility]

## 2023-07-29 DEATH — deceased

## 2023-07-30 ENCOUNTER — Inpatient Hospital Stay (HOSPITAL_COMMUNITY): Payer: Medicare HMO

## 2023-07-30 DIAGNOSIS — I10 Essential (primary) hypertension: Secondary | ICD-10-CM | POA: Diagnosis not present

## 2023-07-30 DIAGNOSIS — C55 Malignant neoplasm of uterus, part unspecified: Secondary | ICD-10-CM

## 2023-07-30 DIAGNOSIS — B370 Candidal stomatitis: Secondary | ICD-10-CM | POA: Insufficient documentation

## 2023-07-30 DIAGNOSIS — I480 Paroxysmal atrial fibrillation: Secondary | ICD-10-CM | POA: Diagnosis not present

## 2023-07-30 DIAGNOSIS — D61818 Other pancytopenia: Secondary | ICD-10-CM | POA: Insufficient documentation

## 2023-07-30 DIAGNOSIS — E039 Hypothyroidism, unspecified: Secondary | ICD-10-CM | POA: Insufficient documentation

## 2023-07-30 DIAGNOSIS — K311 Adult hypertrophic pyloric stenosis: Secondary | ICD-10-CM | POA: Diagnosis not present

## 2023-07-30 DIAGNOSIS — E440 Moderate protein-calorie malnutrition: Secondary | ICD-10-CM

## 2023-07-30 DIAGNOSIS — E1149 Type 2 diabetes mellitus with other diabetic neurological complication: Secondary | ICD-10-CM

## 2023-07-30 LAB — CBC
HCT: 27.5 % — ABNORMAL LOW (ref 36.0–46.0)
Hemoglobin: 8.4 g/dL — ABNORMAL LOW (ref 12.0–15.0)
MCH: 27.2 pg (ref 26.0–34.0)
MCHC: 30.5 g/dL (ref 30.0–36.0)
MCV: 89 fL (ref 80.0–100.0)
Platelets: 148 10*3/uL — ABNORMAL LOW (ref 150–400)
RBC: 3.09 MIL/uL — ABNORMAL LOW (ref 3.87–5.11)
RDW: 15.9 % — ABNORMAL HIGH (ref 11.5–15.5)
WBC: 3.7 10*3/uL — ABNORMAL LOW (ref 4.0–10.5)
nRBC: 0 % (ref 0.0–0.2)

## 2023-07-30 LAB — GLUCOSE, CAPILLARY
Glucose-Capillary: 141 mg/dL — ABNORMAL HIGH (ref 70–99)
Glucose-Capillary: 148 mg/dL — ABNORMAL HIGH (ref 70–99)
Glucose-Capillary: 151 mg/dL — ABNORMAL HIGH (ref 70–99)
Glucose-Capillary: 155 mg/dL — ABNORMAL HIGH (ref 70–99)
Glucose-Capillary: 156 mg/dL — ABNORMAL HIGH (ref 70–99)
Glucose-Capillary: 158 mg/dL — ABNORMAL HIGH (ref 70–99)
Glucose-Capillary: 171 mg/dL — ABNORMAL HIGH (ref 70–99)

## 2023-07-30 LAB — COMPREHENSIVE METABOLIC PANEL
ALT: 25 U/L (ref 0–44)
AST: 18 U/L (ref 15–41)
Albumin: 2.7 g/dL — ABNORMAL LOW (ref 3.5–5.0)
Alkaline Phosphatase: 114 U/L (ref 38–126)
Anion gap: 6 (ref 5–15)
BUN: 33 mg/dL — ABNORMAL HIGH (ref 8–23)
CO2: 25 mmol/L (ref 22–32)
Calcium: 9.1 mg/dL (ref 8.9–10.3)
Chloride: 105 mmol/L (ref 98–111)
Creatinine, Ser: 0.63 mg/dL (ref 0.44–1.00)
GFR, Estimated: >60 mL/min (ref >60)
Glucose, Bld: 179 mg/dL — ABNORMAL HIGH (ref 70–99)
Potassium: 4.5 mmol/L (ref 3.5–5.1)
Sodium: 136 mmol/L (ref 135–145)
Total Bilirubin: 0.2 mg/dL — ABNORMAL LOW (ref 0.3–1.2)
Total Protein: 6.1 g/dL — ABNORMAL LOW (ref 6.5–8.1)

## 2023-07-30 LAB — PHOSPHORUS: Phosphorus: 2.5 mg/dL (ref 2.5–4.6)

## 2023-07-30 LAB — MAGNESIUM: Magnesium: 1.8 mg/dL (ref 1.7–2.4)

## 2023-07-30 MED ORDER — ORAL CARE MOUTH RINSE: 15.0000 mL | OROMUCOSAL | Status: DC | PRN

## 2023-07-30 MED ORDER — RIVAROXABAN 20 MG PO TABS
20.0000 mg | ORAL_TABLET | Freq: Every day | ORAL | Status: DC
  Administered 2023-07-30: 20 mg via ORAL
  Filled 2023-07-30: qty 1

## 2023-07-30 MED ORDER — TRAVASOL 10 % IV SOLN
INTRAVENOUS | Status: AC
  Filled 2023-07-30: qty 900

## 2023-07-30 NOTE — Progress Notes (Signed)
Progress Note   Patient: Sherri Gill KGM:[id number] DOB: [birth year] DOA: 06/30/2023     16 DOS: the patient was seen and examined on 07/30/2023 at 11:37AM      Brief hospital course: Mrs. Gire is a 72 y.o. F with stage IV uterine cancer (s/p TAH, b/l SOO) with peritoneal carcinomatosis on active chemotherapy/immunotherapy, pAF on Xarelto, DM, and HTN who was admitted with intractable nausea and vomiting.     Assessment and Plan:  Refractory ileus due to carcinomatosis Failed clamping trial again. - Consult IR - Continue TPN - Continue NG to LIS - Consult IR for venting PEG - Clamp tube for 2 hours post-oral med administration   Metastatic uterine cancer - Consult Oncology, appreciate cares - Chemo per Oncology  Paroxysmal atrial fibrillation - Hold Xarelto for gastrostomy tube placement - Continue metoprolol and diltiazem as able  Thrush - Continue micafungin and nystatin swish  Hypertension BP slightly high - Continue metoprolol and diltiazem as able  Diabetes - Continue SS correction insulin  Hypothyroidism - Continue levothyroxine as able to take p.o.  Pancytopenia Due to chemo        Subjective: Patient having a lot of discomfort from her NG tube, no fever, no confusion, no respiratory symptoms     Physical Exam: BP (!) 143/51 (BP Location: Right Arm)   Pulse 76   Temp 98.3 F (36.8 C) (Oral)   Resp 18   Ht 5\' 3"  (1.6 m)   Wt 79.6 kg   SpO2 99%   BMI 31.09 kg/m   Elderly adult female, lying in bed, appears weak and tired, NG tube in place RRR, no murmurs, no peripheral edema Respiratory rate normal, lungs clear without rales or wheezes Abdomen tender, diffusely guarding, distended Attention normal, affect appropriate, judgment and insight appear normal    Data Reviewed: Basic metabolic panel normal LFTs normal CBC shows mild anemia, worse from yesterday, mild leukopenia  Family Communication: Friends at the  bedside    Disposition: Status is: Inpatient         Author: Alberteen Sam, MD 07/30/2023 3:19 PM  For on call review [email or web address].

## 2023-07-30 NOTE — Assessment & Plan Note (Addendum)
Peritoneal implants leading to ileus.  Hopefully chemo will being to shrink this over the next few weeks and TPN can be weaned. - Consult Oncology, appreciate cares - Chemo per Oncology

## 2023-07-30 NOTE — Assessment & Plan Note (Signed)
Supplemented 

## 2023-07-30 NOTE — Progress Notes (Signed)
Nutrition Follow-up  DOCUMENTATION CODES:   Non-severe (moderate) malnutrition in context of chronic illness  INTERVENTION:  - Continue goal TPN to meet 100% of needs. Providing 1746 kcals and 90g protein.  -TPN management per Pharmacy  -Daily weights while on TPN   NUTRITION DIAGNOSIS:   Moderate Malnutrition related to chronic illness, cancer and cancer related treatments as evidenced by mild muscle depletion, energy intake < or equal to 75% for > or equal to 1 month. *ongoing  GOAL:   Patient will meet greater than or equal to 90% of their needs *met with TPN  MONITOR:   Labs, Weight trends, I & O's  REASON FOR ASSESSMENT:   Consult New TPN/TNA  ASSESSMENT:   72 y.o. 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  9/16: admitted, NPO, NGT placed 9/18: NGT removed, CLD ->Regular diet ->CLD 9/19: Soft diet ->FLD 9/23: Soft diet 9/24: NPO, NGT replaced 9/26: clamping trials 9/29: clamping trails -> failed, NGT back to suction 9/30: clamping trails resumed 10/2: vomited; NGT back to suction  Patient vomited overnight so NGT back to suction. out from 0100-0700 overnight. Nothing documented since then.  Patient in bed at time of visit. NGT remains to LIS. She reports spitting up a lot of secretions but no vomiting since.  Remains on goal TPN, meeting 100% of needs.  Oncology saw patient today, recommending a venting G-tube. TRH MD to consult IR for tube placement.    Admit weight: 178# Current weight: 175# I&O's: +6.6L  Medications reviewed and include: Insulin  Labs reviewed:  HA1C 6.4 Blood Glucose 151-174 x24 hours Triglycerides 100 (as of 9/30)   Diet  Order:   Diet Order             Diet NPO time specified Except for: Ice Chips, Sips with Meds  Diet effective now                   EDUCATION NEEDS:  No education needs have been identified at this time  Skin:  Skin Assessment: Reviewed RN Assessment  Last BM:  10/1  Height:  Ht Readings from Last 1 Encounters:  07/16/23 5\' 3"  (1.6 m)   Weight:  Wt Readings from Last 1 Encounters:  07/29/23 79.6 kg   BMI:  Body mass index is 31.09 kg/m. Estimated Nutritional Needs:  Kcal:  1650-1850 Protein:  85-100g Fluid:  1.8L/day    Shelle Iron RD, LDN For contact information, refer to [facility].

## 2023-07-30 NOTE — Assessment & Plan Note (Addendum)
S/p venting PEG on 10/3.  Feels better with NG out.   - Keep PEG open to gravity overnight, clamped during the day - Clears today, and hopefully ADAT tomorrow - Continue TPN

## 2023-07-30 NOTE — Assessment & Plan Note (Addendum)
Continue Nystatin

## 2023-07-30 NOTE — TOC Progression Note (Signed)
Transition of Care [facility]) - Progression Note    Patient Details  Name: Sherri Gill MRN: [id number] Date of Birth: Oct 02, 1951  Transition of Care [facility]) CM/SW Contact  Beckie Busing, RN Phone Number:[phone]  07/30/2023, 12:12 PM  Clinical Narrative:    TOC continue to follow. Failed NG clamp trial reconnected to ILWS. Patient is ambulating with mobility specialist. Remains inpatient appropriate per reports of plan for venting gastrostomy.    Expected Discharge Plan: Home/Self Care Barriers to Discharge: Continued Medical Work up  Expected Discharge Plan and Services In-house Referral: NA Discharge Planning Services: CM Consult Post Acute Care Choice: NA Living arrangements for the past 2 months: Apartment                 DME Arranged: N/A                     Social Determinants of Health (SDOH) Interventions SDOH Screenings   Food Insecurity: No Food Insecurity (07/14/2023)  Housing: Low Risk  (07/14/2023)  Transportation Needs: No Transportation Needs (07/14/2023)  Utilities: Not At Risk (07/14/2023)  Alcohol Screen: Low Risk  (02/20/2023)  Depression (PHQ2-9): Medium Risk (03/25/2023)  Financial Resource Strain: Medium Risk (02/20/2023)  Physical Activity: Inactive (02/20/2023)  Social Connections: Unknown (02/20/2023)  Stress: Stress Concern Present (02/20/2023)  Tobacco Use: Medium Risk (07/14/2023)    Readmission Risk Interventions    07/16/2023   11:28 AM  Readmission Risk Prevention Plan  Transportation Screening Complete  PCP or Specialist Appt within 3-5 Days Complete  HRI or Home Care Consult Complete  Social Work Consult for Recovery Care Planning/Counseling Complete  Palliative Care Screening Not Applicable  Medication Review Oceanographer) Referral to Pharmacy

## 2023-07-30 NOTE — Assessment & Plan Note (Signed)
Glucose controlled - Continue SS correction insulin

## 2023-07-30 NOTE — Assessment & Plan Note (Addendum)
Due to chemo. Hgb trending down to 7.6 today, Oncology recommend transfusion - Transfuse PRBCs

## 2023-07-30 NOTE — Progress Notes (Signed)
PHARMACY - TOTAL PARENTERAL NUTRITION CONSULT NOTE   Indication: Prolonged ileus  Patient Measurements: Height: 5\' 3"  (160 cm) Weight: 79.6 kg (175 lb 7.8 oz) IBW/kg (Calculated) : 52.4 TPN AdjBW (KG): 59.5 Body mass index is 31.09 kg/m.  Assessment:  [name] admitted 9/16 for nausea/vomiting x 2.5 weeks. Patient with h/o recurrent uterine cancer--Bx taken during 9/18 small bowel enteroscopy but no signs of malignant obstruction, negative for malignancy. Oncology recommended resumption of chemotherapy during this admission--carboplatin and paclitaxel completed 9/25.   Patient has severe malnutrition given inability to keep food down with continued N/V during this admission and PTA. Pharmacy consulted for TPN management.  - Carcinomatosis causing delayed gastric motility   Glucose / Insulin: Hx of T2DM - sSSI q4h (used 11 units in 24 hrs) - CBGs (goal 60-150): 150s  Electrolytes: Na up to 136 (max Na conc in TPN), Mag 1.8, phos down 2.5  Renal: Scr <1; BUN elevated 33  Hepatic: - LFTs, Tbili, Alk phos all WNL - Albumin low down to 2.7  Intake / Output; MIVF:  - I/O: + 1383 mL - UOP: 4x - Last BM: 9/30 but unmeasured stool documented 10/1 - Emesis/NG: 650 + 1 unmeasured - MIVF: NS at 21ml/hr - Clamping trials of NG unsuccessful so far.  GI Meds: Compazine 5 every 6 refractory nausea, Pepcid 20 IV/hs,  Zofran 4 every 6 as needed and pantoprazole 40 IV BID   GI Imaging: - 9/20 DG UGI: Gastric fold thickening as shown on prior CT and endoscopy. Mildly dilated duodenum, with sluggish flow of contrast through the duodenum--possibility of a more distal SBO is not excluded given this appearance. - 9/29 abd Xray: Slight increase in central small bowel dilatation.   GI Surgeries / Procedures:  - 9/18: small bowel endoscopy: Normal appearing, widely patent esophagus and GEJ. Edematous gastric folds-biopsied. A single duodenal polyp in the post-bulbar duodenum-biopsied  Central  access: port TPN start date:  9/26  Nutritional Goals: Goal TPN rate is 75 mL/hr (provides 90 g of protein and 1746 kcals per day)  RD Assessment: Estimated Needs Total Energy Estimated Needs: 1650-1850 Total Protein Estimated Needs: 85-100g Total Fluid Estimated Needs: 1.8L/day  Current Nutrition:  - TPN - completed thiamine 100mg  daily x5 days (9/26-9/30) NPO Ice chips and sips with meds  Plan:  Today, at 1800:  - Continue TPN at goal rate of 75 mL/hr - Electrolytes in TPN:  Na 150 mEq/L maxed K 50Eq/L  Ca 46mEq/L Mg 82mEq/L Increase Phos to 12 mmol/L Cl:Ac 2:1 - Add standard MVI and trace elements to TPN - continue Sensitive q4h SSI and adjust as needed  - Monitor TPN labs on Mon/Thurs   Sherri Gill, PharmD, BCPS Clinical Staff Pharmacist [email or web address] 07/30/2023 7:41 AM

## 2023-07-30 NOTE — Progress Notes (Addendum)
Patient vomited bile fluid even after giving IV zofran. Administered IV compazine and patient requested to turn NG tube back up to LWIS. Notified J. Garner Nash, NP. He agreed to turn LWIS back on. Will continue to monitor.   Dayshift RN stated that there had been some movement out of the NG tube. Notified J. Garner Nash, NP. New order for KUB to confirm placement of NG tube.   Per KUB - placement is confirmed for NG tube placement. Will keep in place.   0600 - Clamped NG tube to give 0600 PO medications. Patient is in agreement.

## 2023-07-30 NOTE — Progress Notes (Signed)
Mobility Specialist - Progress Note   07/30/23 1005  Mobility  Activity Ambulated independently in hallway  Level of Assistance Independent  Assistive Device None  Distance Ambulated (ft) 500 ft  Activity Response Tolerated well  Mobility Referral Yes  $Mobility charge 1 Mobility  Mobility Specialist Start Time (ACUTE ONLY) 0955  Mobility Specialist Stop Time (ACUTE ONLY) 1004  Mobility Specialist Time Calculation (min) (ACUTE ONLY) 9 min   Pt received in recliner and agreeable to mobility. No complaints during session. Pt to recliner after session with all needs met.     [facility]

## 2023-07-30 NOTE — Assessment & Plan Note (Addendum)
AKI ruled out.  BP slightly high - Continue metoprolol and diltiazem as able

## 2023-07-30 NOTE — Assessment & Plan Note (Signed)
Continue levothyroxine 

## 2023-07-30 NOTE — Plan of Care (Signed)
  Problem: Nutrition: Goal: Adequate nutrition will be maintained Outcome: Progressing   Problem: Safety: Goal: Ability to remain free from injury will improve Outcome: Progressing   Problem: Skin Integrity: Goal: Risk for impaired skin integrity will decrease Outcome: Progressing   Problem: Nutritional: Goal: Maintenance of adequate nutrition will improve Outcome: Progressing

## 2023-07-30 NOTE — Progress Notes (Signed)
Sherri Gill   DOB:November 20, 1950   UV#:[id number]    ASSESSMENT & PLAN:  Recurrent uterine cancer She presents with partial small bowel obstruction She tolerated chemotherapy with carboplatin and paclitaxel 07/23/2023 In the meantime, continue supportive care   Nausea, vomiting and dehydration Feeling better after NG tube decompression I recommend trial of clamping NGT again, failed for second time At this point in time, I recommend venting gastrostomy tube placement Will consult interventional radiologist for this  Acquired pancytopenia Due to chemotherapy She is not symptomatic Observe only No transfusion is needed at this point   Dehydration, mild hypokalemia and other electrolyte imbalance, severe malnutrition Anticipate prolonged ileus and the patient is already malnourished Agree with TPN   Discharge planning Unknown  All questions were answered. The patient knows to call the clinic with any problems, questions or concerns.   The total time spent in the appointment was 55 minutes encounter with patients including review of chart and various tests results, discussions about plan of care and coordination of care plan  Artis Delay, MD 07/30/2023 8:31 AM  Subjective:  Patient is seen this morning.  Unfortunately, the patient failed clamping a second time, each time lasts less than 2 days.  Her NG tube has been in situ for almost a week.  I do not anticipate much improvement over the next few days.  I discussed risk and benefits of venting gastrostomy tube placement with the patient  Objective:  Vitals:   07/29/23 2037 07/30/23 0502  BP: (!) 141/55 (!) 154/63  Pulse: 83 82  Resp: 18 18  Temp: 98.2 F (36.8 C) 98.6 F (37 C)  SpO2: 100% 100%     Intake/Output Summary (Last 24 hours) at 07/30/2023 0831 Last data filed at 07/30/2023 4742 Gross per 24 hour  Intake 2033.59 ml  Output 650 ml  Net 1383.59 ml    GENERAL:alert, no distress and comfortable   Labs:  Recent  Labs    07/27/23 0736 07/28/23 0515 07/29/23 0624 07/30/23 0539  NA 132* 131* 131* 136  K 4.1 4.2 4.4 4.5  CL 97* 97* 100 105  CO2 26 25 22 25   GLUCOSE 127* 152* 180* 179*  BUN 20 29* 33* 33*  CREATININE 0.76 0.78 0.63 0.63  CALCIUM 9.2 9.1 8.9 9.1  GFRNONAA >60 >60 >60 >60  PROT 6.4* 6.5  --  6.1*  ALBUMIN 3.1* 3.1*  --  2.7*  AST 16 23  --  18  ALT 14 17  --  25  ALKPHOS 109 122  --  114  BILITOT 0.5 0.5  --  0.2*    Studies:  DG Abd 1 View  Result Date: 07/30/2023 CLINICAL DATA:  Nasogastric tube placement EXAM: ABDOMEN - 1 VIEW COMPARISON:  07/27/2023 FINDINGS: Enteric tube with tip at the stomach which appears decompressed. No gas dilated bowel is seen, there is partial coverage of the right abdomen. Rounded densities over the left flank are within colonic diverticula based on prior CT. IMPRESSION: Enteric tube with tip at the stomach. Electronically Signed   By: Tiburcio Pea M.D.   On: 07/30/2023 04:36   DG Abd 2 Views  Result Date: 07/27/2023 CLINICAL DATA:  Abdominal pain EXAM: ABDOMEN - 2 VIEW COMPARISON:  07/22/2023 FINDINGS: Gastric catheter is again seen in the stomach. No free air is noted. Right chest wall port is seen. Contrast material is noted in multiple colonic diverticula. A few mildly prominent loops of small bowel are noted in the central abdomen.  This is somewhat increased when compare with the prior CT of 07/12/2023. Bony abnormality is noted. IMPRESSION: Slight increase in central small bowel dilatation. CT may be helpful for further evaluation. Electronically Signed   By: Alcide Clever M.D.   On: 07/27/2023 20:13   DG Abd 1 View  Result Date: 07/22/2023 CLINICAL DATA:  [id number] Encounter for nasogastric (NG) tube placement [id number]. EXAM: ABDOMEN - 1 VIEW COMPARISON:  Abdominal radiograph 07/19/2023. FINDINGS: Right chest Port-A-Cath tip projects over the right atrium. Enteric tube tip and side port project over the stomach. Scant retained contrast in  visualized portions of the colon. IMPRESSION: Enteric tube tip and side port project over the stomach. Electronically Signed   By: Orvan Falconer M.D.   On: 07/22/2023 14:27   DG Abd 1 View  Result Date: 07/19/2023 CLINICAL DATA:  [id number] SBO (small bowel obstruction) (HCC) [id number] EXAM: ABDOMEN - 1 VIEW COMPARISON:  July 18, 2023 FINDINGS: Enteric contrast has progressed to the rectum. Multiple colonic diverticuli. No dilated loops of bowel are seen. IMPRESSION: Enteric contrast has progressed to the rectum. Electronically Signed   By: Meda Klinefelter M.D.   On: 07/19/2023 10:22   DG UGI W SINGLE CM (SOL OR THIN BA)  Result Date: 07/18/2023 CLINICAL DATA:  Endometrial/uterine cancer. Fold thickening in the stomach on recent endoscopy. Reflux and bloating. Difficulty keeping foods and liquids down. EXAM: UPPER GI SERIES WITH KUB TECHNIQUE: After obtaining a scout radiograph a routine upper GI series was performed using thin barium. Mobility was mildly limited on today's exam, for example the patient was unable to turn prone. FLUOROSCOPY: Radiation Exposure Index (as provided by the fluoroscopic device): 21.7 mGy Kerma COMPARISON:  CT abdomen 07/12/2023 FINDINGS: Initial KUB unremarkable aside from mild levoconvex lumbar scoliosis. The pharyngeal phase of swallowing appears normal. Primary peristaltic waves were intact on 4/4 swallows. Small type 1 hiatal hernia. Occasional gastroesophageal reflux was observed on today's exam. I used mucosal relief and balloon compression of the stomach in order to help mitigate the use of thin barium and single contrast technique as well as the patient is inability to perform prone imaging. These demonstrate a gastric fold thickening as shown on prior CT and prior endoscopy. The duodenum is mildly dilated, especially proximally. Eventually this proceeded on to the fourth portion of the duodenum as shown on image 1 series 15. However, the contrast column was sluggish  to flow through the duodenum, and given the somewhat dilated appearance, the possibility of obstruction distally is not excluded. Consider following the barium column with serial KUB use in assessing for small bowel obstruction in several hours. A 13 mm barium tablet passed briskly into the stomach. Port-A-Cath noted. IMPRESSION: 1. Gastric fold thickening as shown on prior CT and endoscopy. 2. Small type 1 hiatal hernia. 3. Intermittent gastroesophageal reflux. 4. Mildly dilated duodenum, with sluggish flow of contrast through the duodenum. The possibility of a more distal small bowel obstruction is not excluded given this appearance. Consider KUB follow-up in several hours to assess for transit of the barium column through the small bowel in assessing for small bowel obstruction. Electronically Signed   By: Gaylyn Rong M.D.   On: 07/18/2023 12:00   DG Abd Portable 1 View  Result Date: 21-Jul-2023 CLINICAL DATA:  Nasogastric tube placement. EXAM: PORTABLE ABDOMEN - 1 VIEW COMPARISON:  Abdomen and pelvis CT dated 07/12/2023. FINDINGS: Normal bowel-gas pattern. Interval nasogastric tube with its tip and side hole in the proximal to mid stomach. Lower  thoracic spine degenerative changes. IMPRESSION: Nasogastric tube tip and side hole in the proximal to mid stomach. Electronically Signed   By: Beckie Salts M.D.   On: 07/22/2023 17:09   DG Abdomen Acute W/Chest  Result Date: 07/07/2023 CLINICAL DATA:  Vomiting EXAM: DG ABDOMEN ACUTE WITH 1 VIEW CHEST COMPARISON:  07/10/2023. FINDINGS: There is no evidence of dilated bowel loops or free intraperitoneal air. No radiopaque calculi or other significant radiographic abnormality is seen. Heart size and mediastinal contours are within normal limits. Both lungs are clear. There are thoracic degenerative changes. Right-sided Port-A-Cath tip overlies distal SVC. IMPRESSION: Negative abdominal radiographs.  No acute cardiopulmonary disease. Electronically Signed    By: Layla Maw M.D.   On: 07/06/2023 14:40   CT ABDOMEN PELVIS W CONTRAST  Result Date: 07/12/2023 CLINICAL DATA:  Endometrial/uterine cancer.  Nausea and vomiting. * Tracking Code: BO * EXAM: CT ABDOMEN AND PELVIS WITH CONTRAST TECHNIQUE: Multidetector CT imaging of the abdomen and pelvis was performed using the standard protocol following bolus administration of intravenous contrast. RADIATION DOSE REDUCTION: This exam was performed according to the departmental dose-optimization program which includes automated exposure control, adjustment of the mA and/or kV according to patient size and/or use of iterative reconstruction technique. CONTRAST:  OMNIPAQUE IOHEXOL 300 MG/ML  SOLN COMPARISON:  CT abdomen/pelvis from 06/17/2023 FINDINGS: Lower chest: A catheter terminates in the right atrium on the top most image. Mild cardiomegaly. Hepatobiliary: Unremarkable Pancreas: Unremarkable Spleen: Unremarkable Adrenals/Urinary Tract: Unremarkable Stomach/Bowel: Diffuse gastric wall thickening with mild gastric and substantial proximal duodenal distension. After crossing the midline, the duodenum does not appear distended but there is substantial distal duodenal and jejunal wall thickening, worsened from prior exams. Scattered colonic diverticula. Appendix unremarkable. No pneumatosis. No hypoenhancement of bowel wall. There are abnormal contains small fluid collections along the small bowel mesentery, for example along the left abdomen on image 64 series 7 and along the right abdomen on image 55 series 7. In addition there is fluid density in the lesser sac on image 76 series 7. Vascular/Lymphatic: Mild atheromatous vascular disease of the aortoiliac tree. Patent celiac trunk and SMA. Patent superior mesenteric vein. Suspected left iliac adenopathy measuring about 1.3 cm in diameter on image 47 series 2, stable. This is of similar density to the adjacent vein. Reproductive: Uterus absent. Nodular enhancement  along the left vaginal cuff measuring 2.5 by 1.7 cm, previously 2.3 by 1.7 cm by my measurements. Tumor in this location is not excluded. Other: There is some abnormal stranding along the remaining omentum for example on image 73 series 9. Small nodular deposit along the upper omentum on image 31 series 2. Musculoskeletal: Lower thoracic and lumbar spondylosis. Density within the umbilicus on image 102 series 9, potentially a small knuckle of bowel versus tumor. IMPRESSION: 1. Worsening of gastric wall thickening, distal duodenal wall thickening, and jejunal wall thickening. The stomach and proximal duodenum are distended. The appearance favors gastroenteritis. 2. There is also some abnormal stranding along the remaining omentum. There are also abnormal fluid collections along the small bowel mesentery and in the lesser sac. The appearance raises concern for possible residual peritoneal tumor causing fluid deposits. Small nodular deposit along the upper omentum along with nodularity in the umbilicus. 3. Nodular enhancement along the left vaginal cuff measuring 2.5 by 1.7 cm, previously 2.3 by 1.7 cm by my measurements. This likely represents tumor. 4. Suspected left iliac adenopathy measuring about 1.3 cm in diameter, stable. 5. Mild cardiomegaly. 6. Scattered colonic diverticula.  7. Mild atheromatous vascular disease of the aortoiliac tree. 8. Lower thoracic and lumbar spondylosis. Electronically Signed   By: Gaylyn Rong M.D.   On: 07/12/2023 14:56   DG Abd 1 View  Result Date: 07/10/2023 CLINICAL DATA:  Abdominal pain, vomiting. History of uterine cancer. EXAM: ABDOMEN - 1 VIEW COMPARISON:  April 09, 2022. FINDINGS: The bowel gas pattern is normal. Surgical clip is seen over the left sacrum. No radio-opaque calculi or other significant radiographic abnormality are seen. IMPRESSION: No abnormal bowel dilatation. Electronically Signed   By: Lupita Raider M.D.   On: 07/10/2023 14:18

## 2023-07-30 NOTE — Assessment & Plan Note (Addendum)
Severe malnutrition ruled out.  Related to cancer and cancer related treatments as evidenced by mild muscle depletion, energy intake less than 75% for more than 1 month.  - Continue TPN - Consult dietitian

## 2023-07-30 NOTE — Assessment & Plan Note (Addendum)
-   Continue Xarelto - Continue metoprolol and diltiazem

## 2023-07-31 ENCOUNTER — Inpatient Hospital Stay (HOSPITAL_COMMUNITY): Payer: Medicare HMO

## 2023-07-31 ENCOUNTER — Encounter: Payer: Medicare HMO | Admitting: Internal Medicine

## 2023-07-31 DIAGNOSIS — I10 Essential (primary) hypertension: Secondary | ICD-10-CM | POA: Diagnosis not present

## 2023-07-31 DIAGNOSIS — R112 Nausea with vomiting, unspecified: Secondary | ICD-10-CM | POA: Diagnosis not present

## 2023-07-31 DIAGNOSIS — K567 Ileus, unspecified: Secondary | ICD-10-CM | POA: Diagnosis not present

## 2023-07-31 DIAGNOSIS — Z515 Encounter for palliative care: Secondary | ICD-10-CM | POA: Diagnosis not present

## 2023-07-31 DIAGNOSIS — I480 Paroxysmal atrial fibrillation: Secondary | ICD-10-CM | POA: Diagnosis not present

## 2023-07-31 DIAGNOSIS — C55 Malignant neoplasm of uterus, part unspecified: Secondary | ICD-10-CM | POA: Diagnosis not present

## 2023-07-31 DIAGNOSIS — Z7189 Other specified counseling: Secondary | ICD-10-CM

## 2023-07-31 DIAGNOSIS — K311 Adult hypertrophic pyloric stenosis: Secondary | ICD-10-CM | POA: Diagnosis not present

## 2023-07-31 HISTORY — PX: IR GASTROSTOMY TUBE MOD SED: IMG625

## 2023-07-31 LAB — COMPREHENSIVE METABOLIC PANEL
ALT: 18 U/L (ref 0–44)
AST: 13 U/L — ABNORMAL LOW (ref 15–41)
Albumin: 2.6 g/dL — ABNORMAL LOW (ref 3.5–5.0)
Alkaline Phosphatase: 114 U/L (ref 38–126)
Anion gap: 7 (ref 5–15)
BUN: 28 mg/dL — ABNORMAL HIGH (ref 8–23)
CO2: 25 mmol/L (ref 22–32)
Calcium: 9 mg/dL (ref 8.9–10.3)
Chloride: 106 mmol/L (ref 98–111)
Creatinine, Ser: 0.68 mg/dL (ref 0.44–1.00)
GFR, Estimated: >60 mL/min (ref >60)
Glucose, Bld: 168 mg/dL — ABNORMAL HIGH (ref 70–99)
Potassium: 4.3 mmol/L (ref 3.5–5.1)
Sodium: 138 mmol/L (ref 135–145)
Total Bilirubin: 0.2 mg/dL — ABNORMAL LOW (ref 0.3–1.2)
Total Protein: 5.9 g/dL — ABNORMAL LOW (ref 6.5–8.1)

## 2023-07-31 LAB — PROTIME-INR
INR: 1.1 (ref 0.8–1.2)
Prothrombin Time: 13.9 s (ref 11.4–15.2)

## 2023-07-31 LAB — GLUCOSE, CAPILLARY
Glucose-Capillary: 145 mg/dL — ABNORMAL HIGH (ref 70–99)
Glucose-Capillary: 163 mg/dL — ABNORMAL HIGH (ref 70–99)
Glucose-Capillary: 151 mg/dL — ABNORMAL HIGH (ref 70–99)
Glucose-Capillary: 167 mg/dL — ABNORMAL HIGH (ref 70–99)
Glucose-Capillary: 133 mg/dL — ABNORMAL HIGH (ref 70–99)
Glucose-Capillary: 169 mg/dL — ABNORMAL HIGH (ref 70–99)

## 2023-07-31 LAB — CBC
HCT: 26.3 % — ABNORMAL LOW (ref 36.0–46.0)
Hemoglobin: 7.9 g/dL — ABNORMAL LOW (ref 12.0–15.0)
MCH: 27.6 pg (ref 26.0–34.0)
MCHC: 30 g/dL (ref 30.0–36.0)
MCV: 92 fL (ref 80.0–100.0)
Platelets: 133 10*3/uL — ABNORMAL LOW (ref 150–400)
RBC: 2.86 MIL/uL — ABNORMAL LOW (ref 3.87–5.11)
RDW: 15.9 % — ABNORMAL HIGH (ref 11.5–15.5)
WBC: 3 10*3/uL — ABNORMAL LOW (ref 4.0–10.5)
nRBC: 0 % (ref 0.0–0.2)

## 2023-07-31 LAB — MAGNESIUM: Magnesium: 1.6 mg/dL — ABNORMAL LOW (ref 1.7–2.4)

## 2023-07-31 LAB — PHOSPHORUS: Phosphorus: 3.3 mg/dL (ref 2.5–4.6)

## 2023-07-31 MED ORDER — MIDAZOLAM HCL 2 MG/2ML IJ SOLN
INTRAMUSCULAR | Status: AC
  Filled 2023-07-31: qty 4

## 2023-07-31 MED ORDER — LIDOCAINE-EPINEPHRINE 1 %-1:100000 IJ SOLN
20.0000 mL | Freq: Once | INTRAMUSCULAR | Status: AC
  Administered 2023-07-31: 10 mL via INTRADERMAL
  Filled 2023-07-31: qty 20

## 2023-07-31 MED ORDER — DILTIAZEM HCL ER COATED BEADS 120 MG PO CP24
120.0000 mg | ORAL_CAPSULE | Freq: Every day | ORAL | Status: DC
  Administered 2023-08-01 – 2023-08-08 (×8): 120 mg via ORAL
  Filled 2023-07-31 (×8): qty 1

## 2023-07-31 MED ORDER — LIDOCAINE-EPINEPHRINE 1 %-1:100000 IJ SOLN
INTRAMUSCULAR | Status: AC
  Filled 2023-07-31: qty 1

## 2023-07-31 MED ORDER — RIVAROXABAN 20 MG PO TABS
20.0000 mg | ORAL_TABLET | Freq: Every day | ORAL | Status: DC
  Administered 2023-07-31 – 2023-08-07 (×8): 20 mg via ORAL
  Filled 2023-07-31 (×8): qty 1

## 2023-07-31 MED ORDER — SIMETHICONE 40 MG/0.6ML PO SUSP
40.0000 mg | Freq: Once | ORAL | Status: DC
  Filled 2023-07-31: qty 0.6

## 2023-07-31 MED ORDER — TRAMADOL HCL 50 MG PO TABS
50.0000 mg | ORAL_TABLET | Freq: Four times a day (QID) | ORAL | Status: AC | PRN
  Administered 2023-07-31: 50 mg via ORAL
  Filled 2023-07-31: qty 1

## 2023-07-31 MED ORDER — SIMETHICONE 80 MG PO CHEW
80.0000 mg | CHEWABLE_TABLET | Freq: Four times a day (QID) | ORAL | Status: DC | PRN
  Administered 2023-07-31 – 2023-08-05 (×5): 80 mg via ORAL
  Filled 2023-07-31 (×6): qty 1

## 2023-07-31 MED ORDER — ONDANSETRON HCL 4 MG/2ML IJ SOLN
INTRAMUSCULAR | Status: AC
  Filled 2023-07-31: qty 2

## 2023-07-31 MED ORDER — FENTANYL CITRATE (PF) 100 MCG/2ML IJ SOLN
INTRAMUSCULAR | Status: AC | PRN
Start: 2023-07-31 — End: 2023-07-31
  Administered 2023-07-31 (×2): 50 ug via INTRAVENOUS

## 2023-07-31 MED ORDER — TRAVASOL 10 % IV SOLN
INTRAVENOUS | Status: AC
  Filled 2023-07-31: qty 900

## 2023-07-31 MED ORDER — FENTANYL CITRATE (PF) 100 MCG/2ML IJ SOLN
INTRAMUSCULAR | Status: AC
  Filled 2023-07-31: qty 2

## 2023-07-31 MED ORDER — CEFAZOLIN SODIUM-DEXTROSE 2-4 GM/100ML-% IV SOLN
2.0000 g | INTRAVENOUS | Status: AC
  Administered 2023-07-31: 2 g via INTRAVENOUS

## 2023-07-31 MED ORDER — CEFAZOLIN SODIUM-DEXTROSE 2-4 GM/100ML-% IV SOLN
INTRAVENOUS | Status: AC
  Filled 2023-07-31: qty 100

## 2023-07-31 MED ORDER — GLUCAGON HCL RDNA (DIAGNOSTIC) 1 MG IJ SOLR
INTRAMUSCULAR | Status: AC
  Filled 2023-07-31: qty 1

## 2023-07-31 MED ORDER — ONDANSETRON HCL 4 MG/2ML IJ SOLN
INTRAMUSCULAR | Status: AC | PRN
Start: 2023-07-31 — End: 2023-07-31
  Administered 2023-07-31: 4 mg via INTRAVENOUS

## 2023-07-31 MED ORDER — MIDAZOLAM HCL 2 MG/2ML IJ SOLN
INTRAMUSCULAR | Status: AC | PRN
Start: 2023-07-31 — End: 2023-07-31
  Administered 2023-07-31 (×2): 1 mg via INTRAVENOUS

## 2023-07-31 MED ORDER — IOHEXOL 300 MG/ML  SOLN
50.0000 mL | Freq: Once | INTRAMUSCULAR | Status: AC | PRN
  Administered 2023-07-31: 20 mL

## 2023-07-31 MED ORDER — MAGNESIUM SULFATE 2 GM/50ML IV SOLN
2.0000 g | Freq: Once | INTRAVENOUS | Status: AC
  Administered 2023-07-31: 2 g via INTRAVENOUS
  Filled 2023-07-31: qty 50

## 2023-07-31 NOTE — Plan of Care (Signed)
  Problem: Education: Goal: Knowledge of General Education information will improve Description: Including pain rating scale, medication(s)/side effects and non-pharmacologic comfort measures Outcome: Progressing   Problem: Health Behavior/Discharge Planning: Goal: Ability to manage health-related needs will improve Outcome: Progressing   Problem: Clinical Measurements: Goal: Ability to maintain clinical measurements within normal limits will improve Outcome: Progressing Goal: Will remain free from infection Outcome: Progressing Goal: Diagnostic test results will improve Outcome: Progressing Goal: Cardiovascular complication will be avoided Outcome: Progressing   Problem: Activity: Goal: Risk for activity intolerance will decrease Outcome: Progressing   Problem: Nutrition: Goal: Adequate nutrition will be maintained Outcome: Progressing   Problem: Coping: Goal: Level of anxiety will decrease Outcome: Progressing   Problem: Elimination: Goal: Will not experience complications related to bowel motility Outcome: Progressing Goal: Will not experience complications related to urinary retention Outcome: Progressing   Problem: Pain Managment: Goal: General experience of comfort will improve Outcome: Progressing   Problem: Safety: Goal: Ability to remain free from injury will improve Outcome: Progressing   Problem: Skin Integrity: Goal: Risk for impaired skin integrity will decrease Outcome: Progressing   Problem: Education: Goal: Ability to describe self-care measures that may prevent or decrease complications (Diabetes Survival Skills Education) will improve Outcome: Progressing Goal: Individualized Educational Video(s) Outcome: Progressing   Problem: Coping: Goal: Ability to adjust to condition or change in health will improve Outcome: Progressing   Problem: Fluid Volume: Goal: Ability to maintain a balanced intake and output will improve Outcome: Progressing    Problem: Health Behavior/Discharge Planning: Goal: Ability to identify and utilize available resources and services will improve Outcome: Progressing Goal: Ability to manage health-related needs will improve Outcome: Progressing   Problem: Metabolic: Goal: Ability to maintain appropriate glucose levels will improve Outcome: Progressing   Problem: Nutritional: Goal: Maintenance of adequate nutrition will improve Outcome: Progressing Goal: Progress toward achieving an optimal weight will improve Outcome: Progressing   Problem: Skin Integrity: Goal: Risk for impaired skin integrity will decrease Outcome: Progressing   Problem: Tissue Perfusion: Goal: Adequacy of tissue perfusion will improve Outcome: Progressing   

## 2023-07-31 NOTE — Procedures (Signed)
Interventional Radiology Procedure Note  Procedure: Placement of percutaneous 56F balloon-retention gastrostomy tube.  Complications: None  EBL:  < 5 mL  Recommendations: - NPO for 4 hours after placement - Maintain G-tube to low wall suction for 4 hours after placement - May advance diet as tolerated and begin using tube 4 hours after placement - Gastropexy sutures are absorbable and do not require removal  Marliss Coots, MD Pager: [phone]

## 2023-07-31 NOTE — Progress Notes (Signed)
The patient returned from IR procedure. Peg tube dressing is clean, dry intact. The pt denies pain or discomfort at this time.   07/31/23 1131  Vitals  Temp 98.5 F (36.9 C)  Temp Source Oral  BP (!) 180/80  MAP (mmHg) 108  BP Location Right Arm  BP Method Automatic  Patient Position (if appropriate) Lying  Pulse Rate (!) 101  Pulse Rate Source Monitor  Resp 16  MEWS COLOR  MEWS Score Color Green  Oxygen Therapy  SpO2 96 %  O2 Device Room Air  MEWS Score  MEWS Temp 0  MEWS Systolic 0  MEWS Pulse 1  MEWS RR 0  MEWS LOC 0  MEWS Score 1

## 2023-07-31 NOTE — Progress Notes (Signed)
PHARMACY - TOTAL PARENTERAL NUTRITION CONSULT NOTE   Indication: Prolonged ileus  Patient Measurements: Height: 5\' 3"  (160 cm) Weight: 79.6 kg (175 lb 7.8 oz) IBW/kg (Calculated) : 52.4 TPN AdjBW (KG): 59.5 Body mass index is 31.09 kg/m.  Assessment:  [name] admitted 9/16 for nausea/vomiting x 2.5 weeks. Patient with h/o recurrent uterine cancer--Bx taken during 9/18 small bowel enteroscopy but no signs of malignant obstruction, negative for malignancy. Oncology recommended resumption of chemotherapy during this admission--carboplatin and paclitaxel completed 9/25.   Patient has severe malnutrition given inability to keep food down with continued N/V during this admission and PTA. Pharmacy consulted for TPN management.  - Carcinomatosis causing delayed gastric motility   Glucose / Insulin: Hx of T2DM - sSSI q4h (used 9 units in 24 hrs) - CBGs (goal 60-150): 145-171  Electrolytes: Na up to 138 (max Na conc in TPN), Mag down 1.6, phos improved to 3.3; CorrCa 10.12; other lytes wnl   Renal: Scr <1; BUN elevated but down 28  Hepatic: - AST low, Tbili low, Alk phos WNL - Albumin low down to 2.6 - TG 100 (9/30)  Intake / Output; MIVF:  - I/O: + 1105 mL - UOP: 2x - Last BM: 9/30 but unmeasured stool documented 10/1 - Emesis/NG: 600 - MIVF: NS at 20ml/hr - Clamping trials of NG unsuccessful so far.  GI Meds: Compazine 5 every 6 hrs prn  refractory nausea, Pepcid 20 IV/hs,  Zofran 4 every 6 as needed and pantoprazole 40 IV BID   GI Imaging: - 9/20 DG UGI: Gastric fold thickening as shown on prior CT and endoscopy. Mildly dilated duodenum, with sluggish flow of contrast through the duodenum--possibility of a more distal SBO is not excluded given this appearance. - 9/29 abd Xray: Slight increase in central small bowel dilatation.   GI Surgeries / Procedures:  - 9/18: small bowel endoscopy: Normal appearing, widely patent esophagus and GEJ. Edematous gastric folds-biopsied. A  single duodenal polyp in the post-bulbar duodenum-biopsied  Central access: port TPN start date:  9/26  Nutritional Goals: Goal TPN rate is 75 mL/hr (provides 90 g of protein and 1746 kcals per day)  RD Assessment: Estimated Needs Total Energy Estimated Needs: 1650-1850 Total Protein Estimated Needs: 85-100g Total Fluid Estimated Needs: 1.8L/day  Current Nutrition:  - TPN - completed thiamine 100mg  daily x5 days (9/26-9/30) NPO Ice chips and sips with meds  Plan:   Now:  - Magnesium sulfate 2gm IV x1  Today, at 1800:  - Continue TPN at goal rate of 75 mL/hr - Electrolytes in TPN:  Na 150 mEq/L maxed K 50Eq/L  Ca 15mEq/L Mg 42mEq/L Phos 10 mmol/L Cl:Ac 1:1 - Add standard MVI and trace elements to TPN - continue Sensitive q4h SSI and adjust as needed  - Monitor TPN labs on Mon/Thurs - bmet, phos, mag on 10/4  Dorna Leitz, PharmD, BCPS 07/31/2023 7:12 AM

## 2023-07-31 NOTE — Progress Notes (Signed)
I met with the patient briefly.  The patient is on the wait to interventional radiology for G-tube placement.  She feels fine.  Denies nausea or pain.  Plan of care Recurrent uterine cancer She presents with partial small bowel obstruction She tolerated chemotherapy with carboplatin and paclitaxel 07/23/2023 In the meantime, continue supportive care   Nausea, vomiting and dehydration Feeling better after NG tube decompression I recommend trial of clamping NGT again, failed for second time At this point in time, I recommend venting gastrostomy tube placement   Acquired pancytopenia Due to chemotherapy She is not symptomatic Observe only No transfusion is needed at this point Will consider blood transfusion tomorrow if her hemoglobin continues to trend down   Dehydration, mild hypokalemia and other electrolyte imbalance, severe malnutrition Anticipate prolonged ileus and the patient is already malnourished Agree with TPN   Discharge planning Unknown, will follow

## 2023-07-31 NOTE — Consult Note (Signed)
Chief Complaint: Patient was seen in consultation today for recurrent uterine cancer  Referring Physician(s): Artis Delay  Supervising Physician: Marliss Coots  Patient Status: [facility] - In-pt  History of Present Illness: Sherri Gill is a 72 y.o. female with history significant for recurrent uterine cancer and severe ileus due to carcinomatosis who presented to [facility] ED with nausea, vomiting, poor PO tolerance, and malnutrition.  She has required venting NGT and TPN without ability to resume PO intake.  IR consulted for venting G-tube placement.   PA to bedside to assess.  Patient resting in bed.  She complained of "chest fullness" this AM so RN obtaining EKG for rule out.  Patient without additional complaint.  Vital signs otherwise stable. She is aware of goal for venting gastrostomy and is agreeable to proceed.     Past Medical History:  Diagnosis Date   AKI (acute kidney injury) (HCC)    History of   Ascites    09/2021 paracentesis with almost 3 L of fluid 12/2021 Paracentesis performed with 2.8 L   Carpal tunnel syndrome 02/11/2014   Chronic anemia 08/29/2012   Need colonoscopy or report. Need iron panel.    Dyslipidemia 06/30/2007   Dyspnea    Essential hypertension, benign 06/30/2007   GERD 06/30/2007   History of blood transfusion    History of fatty infiltration of liver    History of migraine    Hx of Herpes simplex meningitis 2015   Also noted to have primary empty sella on imaging at this admission   Insomnia 06/11/2012   Major depressive disorder, recurrent episode, moderate with anxious distress (HCC) 06/30/2007   Nausea and vomiting 01/13/2022   Obesity, BMI 35-40 06/11/2012   Peripheral neuropathy 2/2 T2DM 12/28/2009   Primary empty sella syndrome (HCC) 2015   Noted on imaging during hospitalization for herpes meningitis; no pituitary mass, no hormone w/u at that time, hormonally asymptomatic   Type 2 diabetes mellitus with neurological complications (HCC)  06/30/2007    Past Surgical History:  Procedure Laterality Date   BIOPSY  07/16/2023   Procedure: BIOPSY;  Surgeon: Charna Elizabeth, MD;  Location: [facility];  Service: Gastroenterology;;   COLONOSCOPY W/ POLYPECTOMY  06/28/2004   DENTAL SURGERY     DILATATION & CURETTAGE/HYSTEROSCOPY WITH MYOSURE N/A 01/15/2022   Procedure: DILATATION & CURETTAGE/HYSTEROSCOPY WITH MYOSURE;  Surgeon: Carver Fila, MD;  Location: [facility];  Service: Gynecology;  Laterality: N/A;  DO NOT OPEN HYSTEROSCOPY KIT   ENTEROSCOPY N/A 07/16/2023   Procedure: ENTEROSCOPY;  Surgeon: Charna Elizabeth, MD;  Location: [facility];  Service: Gastroenterology;  Laterality: N/A;   HYSTERECTOMY ABDOMINAL WITH SALPINGO-OOPHORECTOMY Bilateral 04/09/2022   Procedure: HYSTERECTOMY ABDOMINAL BILATERAL SALPINGO OOPHORECTOMY WITH OMENTECTOMY ,DEBULKING;  Surgeon: Carver Fila, MD;  Location: [facility];  Service: Gynecology;  Laterality: Bilateral;   IR IMAGING GUIDED PORT INSERTION  01/18/2022   LAPAROSCOPY N/A 04/09/2022   Procedure: LAPAROSCOPY DIAGNOSTIC;  Surgeon: Carver Fila, MD;  Location: [facility];  Service: Gynecology;  Laterality: N/A;   OPERATIVE ULTRASOUND N/A 01/15/2022   Procedure: OPERATIVE ULTRASOUND;  Surgeon: Carver Fila, MD;  Location: [facility];  Service: Gynecology;  Laterality: N/A;   port a cath placement     TUBAL LIGATION     age 58    Allergies: Codeine sulfate, Penicillins, Percocet [oxycodone-acetaminophen], Pork-derived products, and Zestril [lisinopril]  Medications: Prior to Admission medications   Medication Sig Start Date End Date Taking? Authorizing Provider  acetaminophen (TYLENOL) 500 MG tablet Take  1,000 mg by mouth every 6 (six) hours as needed for mild pain.   Yes [provider]  albuterol (VENTOLIN HFA) 108 (90 Base) MCG/ACT inhaler Inhale 2 puffs into the lungs every 4 (four) hours as needed for wheezing or shortness of breath. 12/31/21  Yes Glade Lloyd, MD   Biotin 1000 MCG CHEW Chew 1,000 mcg by mouth daily.   Yes [provider]  Cholecalciferol (VITAMIN D3) 25 MCG (1000 UT) CAPS Take 1,000 Units by mouth daily with breakfast.   Yes [provider]  diltiazem (CARDIZEM CD) 120 MG 24 hr capsule Take 1 capsule (120 mg total) by mouth daily. 02/20/23  Yes [name], MD  dorzolamide-timolol (COSOPT) 22.3-6.8 MG/ML ophthalmic solution Place 1 drop into both eyes in the morning and at bedtime. 12/15/21  Yes [provider]  hydrALAZINE (APRESOLINE) 25 MG tablet TAKE 1 TABLET BY MOUTH THREE TIMES DAILY 01/03/23  Yes [name], MD  HYDROmorphone (DILAUDID) 2 MG tablet Take 1 tablet (2 mg) by mouth 3 times daily as needed for severe pain. 06/20/23  Yes Gorsuch, Ni, MD  latanoprost (XALATAN) 0.005 % ophthalmic solution Place 1 drop into both eyes at bedtime. 12/15/21  Yes [provider]  lidocaine-prilocaine (EMLA) cream Apply to affected area once as directed Patient taking differently: Apply 1 Application topically See admin instructions. Apply to affected area as directed for port access 01/31/23  Yes Gorsuch, Ni, MD  losartan (COZAAR) 100 MG tablet Take 1 tablet (100 mg total) by mouth at bedtime. 02/17/23 02/12/24 Yes [name], MD  magnesium oxide (MAG-OX) 400 (240 Mg) MG tablet Take 1 tablet (400 mg total) by mouth 2 (two) times daily. 03/21/23  Yes Gorsuch, Ni, MD  metFORMIN (GLUCOPHAGE) 1000 MG tablet Take 1 tablet (1,000 mg total) by mouth 2 (two) times daily. 08/28/22 08/28/23 Yes Atway, Rayann N, DO  methylPREDNISolone (MEDROL DOSEPAK) 4 MG TBPK tablet Take 6 tablets by mouth on day 1 then decrease by 1 tablet daily until finished (6-5-4-3-2-1) Patient taking differently: Take 4-24 mg by mouth See admin instructions. Take 24 mg (6 tablets) by mouth on day 1 then decrease by 4 mg daily (1 tablet) daily until finished (6-5-4-3-2-1) 07/07/23  Yes Walisiewicz, Kaitlyn E, PA-C  ondansetron (ZOFRAN) 8  MG tablet Take 1 tablet (8 mg total) by mouth every 8 (eight) hours as needed for nausea or vomiting. Start on the third day after chemotherapy. 01/31/23  Yes Gorsuch, Ni, MD  pantoprazole (PROTONIX) 40 MG tablet Take 1 tablet by mouth daily. Patient taking differently: Take 40 mg by mouth daily before breakfast. 02/20/23 02/15/24 Yes [name], MD  polyethylene glycol powder (GLYCOLAX/MIRALAX) 17 GM/SCOOP powder Take 17 g by mouth daily. 03/28/22  Yes Cross, Melissa D, NP  prochlorperazine (COMPAZINE) 10 MG tablet Take 1 tablet (10 mg total) by mouth every 6 (six) hours as needed for nausea or vomiting. 01/31/23  Yes Artis Delay, MD  promethazine (PHENERGAN) 25 MG suppository Place 1 suppository (25 mg total) rectally every 6 (six) hours as needed for up to 12 days for nausea or vomiting. 07/12/23 07/24/23 Yes Young, Harmon Dun, DO  rivaroxaban (XARELTO) 20 MG TABS tablet TAKE 1 TABLET BY MOUTH ONCE DAILY WITH SUPPER 06/20/23  Yes [name], MD  TUMS 500 MG chewable tablet Chew 1 tablet by mouth 3 (three) times daily as needed for indigestion or heartburn.   Yes [provider]  atorvastatin (LIPITOR) 20 MG tablet Take 1  tablet (20 mg total) by mouth at bedtime. Patient not taking: Reported on 07/14/2023 08/28/22 08/28/23  Atway, Rayann N, DO  Blood Glucose Monitoring Suppl (ONE TOUCH ULTRA MINI) w/Device KIT Please use as directed. 12/20/16   Beather Arbour, MD  Cholecalciferol 1000 units capsule Take 1 capsule (1,000 Units total) by mouth daily. Patient not taking: Reported on 07/14/2023 11/08/16   Valentino Nose, MD  glucose blood (ONE TOUCH ULTRA TEST) test strip Use as instructed 12/20/16   Beather Arbour, MD  Insulin Pen Needle 32G X 4 MM MISC 1 each by Does not apply route daily. 09/01/19   Lorenso Courier, MD  levothyroxine (SYNTHROID) 50 MCG tablet Take 1 tablet (50 mcg total) by mouth daily before breakfast. Patient not taking: Reported on 07/14/2023 06/20/23   Artis Delay, MD   [facility] DELICA LANCETS 33G MISC Please use as directed. 12/20/16   Beather Arbour, MD     Family History  Adopted: Yes  Problem Relation Age of Onset   Dementia Mother    Breast cancer Sister        paternal half-sister   Liver cancer Brother        maternal half-brother   Thyroid cancer Maternal Aunt    Prostate cancer Maternal Uncle    Prostate cancer Cousin    Breast cancer Other    Ovarian cancer Neg Hx    Colon cancer Neg Hx    Endometrial cancer Neg Hx    Pancreatic cancer Neg Hx     Social History   Socioeconomic History   Marital status: Single    Spouse name: Not on file   Number of children: Not on file   Years of education: 12   Highest education level: Not on file  Occupational History   Occupation: SALES    Employer: [employer]  Tobacco Use   Smoking status: Former    Current packs/day: 0.00    Average packs/day: 0.3 packs/day for 10.0 years (2.5 ttl pk-yrs)    Types: Cigarettes    Start date: 10/28/1968    Quit date: 10/28/1978    Years since quitting: 44.7   Smokeless tobacco: Never  Vaping Use   Vaping status: Never Used  Substance and Sexual Activity   Alcohol use: No    Alcohol/week: 0.0 standard drinks of alcohol   Drug use: No   Sexual activity: Not Currently    Partners: Male    Birth control/protection: Post-menopausal  Other Topics Concern   Not on file  Social History Narrative   Not on file   Social Determinants of Health   Financial Resource Strain: Medium Risk (02/20/2023)   Overall Financial Resource Strain (CARDIA)    Difficulty of Paying Living Expenses: Somewhat hard  Food Insecurity: No Food Insecurity (07/14/2023)   Hunger Vital Sign    Worried About Running Out of Food in the Last Year: Never true    Ran Out of Food in the Last Year: Never true  Transportation Needs: No Transportation Needs (07/14/2023)   PRAPARE - Administrator, Civil Service (Medical): No    Lack of Transportation (Non-Medical): No  Physical  Activity: Inactive (02/20/2023)   Exercise Vital Sign    Days of Exercise per Week: 0 days    Minutes of Exercise per Session: 0 min  Stress: Stress Concern Present (02/20/2023)   [employer] of Occupational Health - Occupational Stress Questionnaire    Feeling of Stress : To some extent  Social Connections:  Unknown (02/20/2023)   Social Connection and Isolation Panel [NHANES]    Frequency of Communication with Friends and Family: More than three times a week    Frequency of Social Gatherings with Friends and Family: Once a week    Attends Religious Services: Patient declined    Database administrator or Organizations: No    Attends Engineer, structural: Patient declined    Marital Status: Patient declined     Review of Systems: A 12 point ROS discussed and pertinent positives are indicated in the HPI above.  All other systems are negative.  Review of Systems  Constitutional:  Negative for fatigue and fever.  Respiratory:  Negative for cough and shortness of breath.   Cardiovascular:  Negative for chest pain.  Gastrointestinal:  Negative for abdominal pain.  Musculoskeletal:  Negative for back pain.  Psychiatric/Behavioral:  Negative for behavioral problems and confusion.     Vital Signs: BP (!) 160/59 (BP Location: Left Arm)   Pulse 87   Temp 99.3 F (37.4 C) (Oral)   Resp 16   Ht 5\' 3"  (1.6 m)   Wt 175 lb 7.8 oz (79.6 kg)   SpO2 96%   BMI 31.09 kg/m   Physical Exam Vitals and nursing note reviewed.  Constitutional:      General: She is not in acute distress.    Appearance: Normal appearance. She is not ill-appearing.  HENT:     Mouth/Throat:     Mouth: Mucous membranes are moist.     Pharynx: Oropharynx is clear.  Cardiovascular:     Rate and Rhythm: Normal rate and regular rhythm.  Pulmonary:     Effort: Pulmonary effort is normal.  Abdominal:     General: Abdomen is flat.     Palpations: Abdomen is soft.  Skin:    General: Skin is warm and  dry.  Neurological:     General: No focal deficit present.     Mental Status: She is alert and oriented to person, place, and time. Mental status is at baseline.  Psychiatric:        Mood and Affect: Mood normal.        Behavior: Behavior normal.        Thought Content: Thought content normal.        Judgment: Judgment normal.      MD Evaluation Airway: WNL Heart: WNL Abdomen: Other (comments) Abdomen comments: distended with NABS Chest/ Lungs: WNL ASA  Classification: 3 Mallampati/Airway Score: Two   Imaging: DG Chest 2 View  Result Date: 07/30/2023 CLINICAL DATA:  Hospital-acquired pneumonia. EXAM: CHEST - 2 VIEW COMPARISON:  Chest/abdominal radiographs 07/14/2023 FINDINGS: A right jugular Port-A-Cath remains in place with tip near the superior cavoatrial junction. An enteric tube courses into the abdomen with tip not imaged and with side port near the GE junction. The cardiomediastinal silhouette is unchanged with normal heart size. Aortic atherosclerosis is noted. The lungs are hypoinflated. No confluent airspace opacity, edema, pleural effusion, or pneumothorax is identified. No acute osseous abnormality is seen. IMPRESSION: No active cardiopulmonary disease. Electronically Signed   By: Sebastian Ache M.D.   On: 07/30/2023 13:44   DG Abd 1 View  Result Date: 07/30/2023 CLINICAL DATA:  Nasogastric tube placement EXAM: ABDOMEN - 1 VIEW COMPARISON:  07/27/2023 FINDINGS: Enteric tube with tip at the stomach which appears decompressed. No gas dilated bowel is seen, there is partial coverage of the right abdomen. Rounded densities over the left flank are within colonic diverticula  based on prior CT. IMPRESSION: Enteric tube with tip at the stomach. Electronically Signed   By: Tiburcio Pea M.D.   On: 07/30/2023 04:36   DG Abd 2 Views  Result Date: 07/27/2023 CLINICAL DATA:  Abdominal pain EXAM: ABDOMEN - 2 VIEW COMPARISON:  07/22/2023 FINDINGS: Gastric catheter is again seen in the  stomach. No free air is noted. Right chest wall port is seen. Contrast material is noted in multiple colonic diverticula. A few mildly prominent loops of small bowel are noted in the central abdomen. This is somewhat increased when compare with the prior CT of 07/12/2023. Bony abnormality is noted. IMPRESSION: Slight increase in central small bowel dilatation. CT may be helpful for further evaluation. Electronically Signed   By: Alcide Clever M.D.   On: 07/27/2023 20:13   DG Abd 1 View  Result Date: 07/22/2023 CLINICAL DATA:  [id number] Encounter for nasogastric (NG) tube placement [id number]. EXAM: ABDOMEN - 1 VIEW COMPARISON:  Abdominal radiograph 07/19/2023. FINDINGS: Right chest Port-A-Cath tip projects over the right atrium. Enteric tube tip and side port project over the stomach. Scant retained contrast in visualized portions of the colon. IMPRESSION: Enteric tube tip and side port project over the stomach. Electronically Signed   By: Orvan Falconer M.D.   On: 07/22/2023 14:27   DG Abd 1 View  Result Date: 07/19/2023 CLINICAL DATA:  [id number] SBO (small bowel obstruction) (HCC) [id number] EXAM: ABDOMEN - 1 VIEW COMPARISON:  July 18, 2023 FINDINGS: Enteric contrast has progressed to the rectum. Multiple colonic diverticuli. No dilated loops of bowel are seen. IMPRESSION: Enteric contrast has progressed to the rectum. Electronically Signed   By: Meda Klinefelter M.D.   On: 07/19/2023 10:22   DG UGI W SINGLE CM (SOL OR THIN BA)  Result Date: 07/18/2023 CLINICAL DATA:  Endometrial/uterine cancer. Fold thickening in the stomach on recent endoscopy. Reflux and bloating. Difficulty keeping foods and liquids down. EXAM: UPPER GI SERIES WITH KUB TECHNIQUE: After obtaining a scout radiograph a routine upper GI series was performed using thin barium. Mobility was mildly limited on today's exam, for example the patient was unable to turn prone. FLUOROSCOPY: Radiation Exposure Index (as provided by the fluoroscopic  device): 21.7 mGy Kerma COMPARISON:  CT abdomen 07/12/2023 FINDINGS: Initial KUB unremarkable aside from mild levoconvex lumbar scoliosis. The pharyngeal phase of swallowing appears normal. Primary peristaltic waves were intact on 4/4 swallows. Small type 1 hiatal hernia. Occasional gastroesophageal reflux was observed on today's exam. I used mucosal relief and balloon compression of the stomach in order to help mitigate the use of thin barium and single contrast technique as well as the patient is inability to perform prone imaging. These demonstrate a gastric fold thickening as shown on prior CT and prior endoscopy. The duodenum is mildly dilated, especially proximally. Eventually this proceeded on to the fourth portion of the duodenum as shown on image 1 series 15. However, the contrast column was sluggish to flow through the duodenum, and given the somewhat dilated appearance, the possibility of obstruction distally is not excluded. Consider following the barium column with serial KUB use in assessing for small bowel obstruction in several hours. A 13 mm barium tablet passed briskly into the stomach. Port-A-Cath noted. IMPRESSION: 1. Gastric fold thickening as shown on prior CT and endoscopy. 2. Small type 1 hiatal hernia. 3. Intermittent gastroesophageal reflux. 4. Mildly dilated duodenum, with sluggish flow of contrast through the duodenum. The possibility of a more distal small bowel obstruction is not excluded given  this appearance. Consider KUB follow-up in several hours to assess for transit of the barium column through the small bowel in assessing for small bowel obstruction. Electronically Signed   By: Gaylyn Rong M.D.   On: 07/18/2023 12:00   DG Abd Portable 1 View  Result Date: 07/14/2023 CLINICAL DATA:  Nasogastric tube placement. EXAM: PORTABLE ABDOMEN - 1 VIEW COMPARISON:  Abdomen and pelvis CT dated 07/12/2023. FINDINGS: Normal bowel-gas pattern. Interval nasogastric tube with its tip  and side hole in the proximal to mid stomach. Lower thoracic spine degenerative changes. IMPRESSION: Nasogastric tube tip and side hole in the proximal to mid stomach. Electronically Signed   By: Beckie Salts M.D.   On: 07/14/2023 17:09   DG Abdomen Acute W/Chest  Result Date: 07/14/2023 CLINICAL DATA:  Vomiting EXAM: DG ABDOMEN ACUTE WITH 1 VIEW CHEST COMPARISON:  07/10/2023. FINDINGS: There is no evidence of dilated bowel loops or free intraperitoneal air. No radiopaque calculi or other significant radiographic abnormality is seen. Heart size and mediastinal contours are within normal limits. Both lungs are clear. There are thoracic degenerative changes. Right-sided Port-A-Cath tip overlies distal SVC. IMPRESSION: Negative abdominal radiographs.  No acute cardiopulmonary disease. Electronically Signed   By: Layla Maw M.D.   On: 07/14/2023 14:40   CT ABDOMEN PELVIS W CONTRAST  Result Date: 07/12/2023 CLINICAL DATA:  Endometrial/uterine cancer.  Nausea and vomiting. * Tracking Code: BO * EXAM: CT ABDOMEN AND PELVIS WITH CONTRAST TECHNIQUE: Multidetector CT imaging of the abdomen and pelvis was performed using the standard protocol following bolus administration of intravenous contrast. RADIATION DOSE REDUCTION: This exam was performed according to the departmental dose-optimization program which includes automated exposure control, adjustment of the mA and/or kV according to patient size and/or use of iterative reconstruction technique. CONTRAST:  OMNIPAQUE IOHEXOL 300 MG/ML  SOLN COMPARISON:  CT abdomen/pelvis from 06/17/2023 FINDINGS: Lower chest: A catheter terminates in the right atrium on the top most image. Mild cardiomegaly. Hepatobiliary: Unremarkable Pancreas: Unremarkable Spleen: Unremarkable Adrenals/Urinary Tract: Unremarkable Stomach/Bowel: Diffuse gastric wall thickening with mild gastric and substantial proximal duodenal distension. After crossing the midline, the duodenum does  not appear distended but there is substantial distal duodenal and jejunal wall thickening, worsened from prior exams. Scattered colonic diverticula. Appendix unremarkable. No pneumatosis. No hypoenhancement of bowel wall. There are abnormal contains small fluid collections along the small bowel mesentery, for example along the left abdomen on image 64 series 7 and along the right abdomen on image 55 series 7. In addition there is fluid density in the lesser sac on image 76 series 7. Vascular/Lymphatic: Mild atheromatous vascular disease of the aortoiliac tree. Patent celiac trunk and SMA. Patent superior mesenteric vein. Suspected left iliac adenopathy measuring about 1.3 cm in diameter on image 47 series 2, stable. This is of similar density to the adjacent vein. Reproductive: Uterus absent. Nodular enhancement along the left vaginal cuff measuring 2.5 by 1.7 cm, previously 2.3 by 1.7 cm by my measurements. Tumor in this location is not excluded. Other: There is some abnormal stranding along the remaining omentum for example on image 73 series 9. Small nodular deposit along the upper omentum on image 31 series 2. Musculoskeletal: Lower thoracic and lumbar spondylosis. Density within the umbilicus on image 102 series 9, potentially a small knuckle of bowel versus tumor. IMPRESSION: 1. Worsening of gastric wall thickening, distal duodenal wall thickening, and jejunal wall thickening. The stomach and proximal duodenum are distended. The appearance favors gastroenteritis. 2. There is also some  abnormal stranding along the remaining omentum. There are also abnormal fluid collections along the small bowel mesentery and in the lesser sac. The appearance raises concern for possible residual peritoneal tumor causing fluid deposits. Small nodular deposit along the upper omentum along with nodularity in the umbilicus. 3. Nodular enhancement along the left vaginal cuff measuring 2.5 by 1.7 cm, previously 2.3 by 1.7 cm by my  measurements. This likely represents tumor. 4. Suspected left iliac adenopathy measuring about 1.3 cm in diameter, stable. 5. Mild cardiomegaly. 6. Scattered colonic diverticula. 7. Mild atheromatous vascular disease of the aortoiliac tree. 8. Lower thoracic and lumbar spondylosis. Electronically Signed   By: Gaylyn Rong M.D.   On: 07/12/2023 14:56   DG Abd 1 View  Result Date: 07/10/2023 CLINICAL DATA:  Abdominal pain, vomiting. History of uterine cancer. EXAM: ABDOMEN - 1 VIEW COMPARISON:  April 09, 2022. FINDINGS: The bowel gas pattern is normal. Surgical clip is seen over the left sacrum. No radio-opaque calculi or other significant radiographic abnormality are seen. IMPRESSION: No abnormal bowel dilatation. Electronically Signed   By: Lupita Raider M.D.   On: 07/10/2023 14:18    Labs:  CBC: Recent Labs    07/23/23 0500 07/29/23 0624 07/30/23 0539 07/31/23 0557  WBC 6.9 4.7 3.7* 3.0*  HGB 8.1* 9.3* 8.4* 7.9*  HCT 26.2* 30.2* 27.5* 26.3*  PLT 253 166 148* 133*    COAGS: No results for input(s): "INR", "APTT" in the last 8760 hours.  BMP: Recent Labs    07/28/23 0515 07/29/23 0624 07/30/23 0539 07/31/23 0557  NA 131* 131* 136 138  K 4.2 4.4 4.5 4.3  CL 97* 100 105 106  CO2 25 22 25 25   GLUCOSE 152* 180* 179* 168*  BUN 29* 33* 33* 28*  CALCIUM 9.1 8.9 9.1 9.0  CREATININE 0.78 0.63 0.63 0.68  GFRNONAA >60 >60 >60 >60    LIVER FUNCTION TESTS: Recent Labs    07/27/23 0736 07/28/23 0515 07/30/23 0539 07/31/23 0557  BILITOT 0.5 0.5 0.2* 0.2*  AST 16 23 18  13*  ALT 14 17 25 18   ALKPHOS 109 122 114 114  PROT 6.4* 6.5 6.1* 5.9*  ALBUMIN 3.1* 3.1* 2.7* 2.6*    TUMOR MARKERS: No results for input(s): "AFPTM", "CEA", "CA199", "CHROMGRNA" in the last 8760 hours.  Assessment and Plan: Uterine cancer with recurrent ileus, nausea, vomiting. Patient with NGT for venting, TPN for nutrition.  No return of adequate bowel function.  Becomes symptomatic with NGT  clamping.  IR consulted for venting gastrostomy tube placement.  NPO.  INR ordered.   Risks and benefits image guided gastrostomy tube placement was discussed with the patient including, but not limited to the need for a barium enema during the procedure, bleeding, infection, peritonitis and/or damage to adjacent structures.  All of the patient's questions were answered, patient is agreeable to proceed.  Consent signed and in chart.   Thank you for this interesting consult.  I greatly enjoyed meeting Sherri Gill and look forward to participating in their care.  A copy of this report was sent to the requesting provider on this date.  Electronically Signed: Hoyt Koch, PA 07/31/2023, 8:48 AM   I spent a total of  40 Minutes   in face to face in clinical consultation, greater than 50% of which was counseling/coordinating care for uterine cancer.

## 2023-07-31 NOTE — Consult Note (Signed)
Palliative Care Consult Note                                  Date: 07/31/2023   Patient Name: [place]  DOB: 02/23/1951  MRN: [id number]  Age / Sex: 72 y.o., female  PCP: Sherri Aschoff, MD Referring Physician: [place], *  Reason for Consultation: Establishing goals of care  HPI/Patient Profile: 72 y.o. female  with past medical history of metastatic uterine cancer with peritoneal carcinomatosis on active chemotherapy/immunotherapy, paroxysmal atrial fibrillation on Xarelto, DM, and HTN who was admitted on 07/05/2023 with intractable nausea and vomiting. She was started on TPN due to refractory ileus.  Palliative Medicine was consulted for goals of care.   Subjective:   I have reviewed medical records including EPIC notes, labs and imaging. Discussed with [place]. Patient seen and examined.   I met with patient at bedside to discuss diagnosis, prognosis, and goals of care. She underwent placement of venting G-tube earlier today.   I introduced Palliative Medicine as specialized medical care for people living with serious illness. It focuses on providing relief from the symptoms and stress of a serious illness.   We discussed patient's current illness and what it means in the larger context of her ongoing co-morbidities. Current clinical status was reviewed.   Created space and opportunity for patient to express thoughts and feelings regarding current medical situation. Values and goals of care were attempted to be elicited.  Social History: Patient is originally from [place].  She retired from working at [employer].  She is separated.  She has 1 daughter and 1 son.  She enjoys doing nails, reading, and thrift shopping.  Functional Status: Patient lives at home, and was independent prior to admission.  Her son lives with her - she shares he has schizophrenia but is stable on his medications. She helps him  by driving him to appointments, and he helps her with the housework.   Today's Discussion: When I initially introduced myself and the role of Palliative Medicine, patient is quite guarded and states "this is scary". I provided reassurance that palliative is intended to be an extra layer of support while she is in the hospital.   Patient shares she was very emotional when she initially received the diagnosis of uterine cancer, but eventually was able to accept it. She expresses appreciation for the care of her oncologist [place]. She shares that she has tolerated chemo "like a champ".   Patient is clear in her desire to continue current supportive care and allow every opportunity for her to thrive.  She prefers to remain positive and to take things 1 day at a time. She is hopeful that the venting G-tube will help improve her abdominal symptoms and allow for TPN to be weaned off. She is very relieved the NG has been removed.   We briefly discussed symptom management. Patient endorses chronic constipation and nausea, but reports symptoms are currently well-controlled on the current regimen.   Encouraged patient to consider outlining her treatment preferences, regarding what medical interventions she would or would not want in the event her condition declines (the "what-ifs"). The MOST form was introduced.  Patient does not have any questions or concerns at this time. She is open to ongoing palliative support.    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  Constitutional:      General: She is not in acute distress. Pulmonary:     Effort:  Pulmonary effort is normal.  Abdominal:     General: There is no distension.  Neurological:     Mental Status: She is alert and oriented to person, place, and time.    Vital Signs:  BP (!) 156/68 (BP Location: Left Arm)   Pulse 100   Temp 97.8 F (36.6 C) (Oral)   Resp 18   Ht 5\' 3"  (1.6 m)   Wt 79.6 kg   SpO2 97%   BMI 31.09 kg/m   Palliative Assessment/Data: PPS 50%     Assessment & Plan:   SUMMARY OF RECOMMENDATIONS   Established therapeutic relationship Education provided on palliative's collaborative role with the medical team Goals of care - clinical improvement and to return home when medically stable PMT will continue to follow and support  Primary Decision Maker: PATIENT  Advanced Directives: HCPOA document on file in EMR/Vynca designating her daughter/[place] as health care agent and her friend/[place] as alternate.    Code Status/Advance Care Planning: Full code - code status discussion was deferred today   Symptom Management:  As ordered per primary team  Prognosis:  Unable to determine  Discharge Planning:  To Be Determined    Thank you for allowing [place] to participate in the care of [place]   Time Total: 75 minutes   Signed by: Sherlean Foot, NP Palliative Medicine Team  Team Phone # [phone]  For individual providers, please see AMION

## 2023-07-31 NOTE — Progress Notes (Signed)
Brief Nutrition Note  Received consult for assessment of nutrition requirements/status. Patient being followed by RD, last assessment 10/2. Please see note for details.  Patient is s/p placement of gastrostomy tube today to be used for venting.   Interventions: - Continue goal TPN to meet 100% of needs. Providing 1746 kcals and 90g protein.  -TPN management per Pharmacy   -Daily weights while on TPN  - Will monitor for diet advancement.   Shelle Iron RD, LDN For contact information, refer to [facility].

## 2023-07-31 NOTE — Progress Notes (Signed)
Progress Note   Patient: [place]:[id number] DOB: May 17, 1951 DOA: 07/23/2023     17 DOS: the patient was seen and examined on 07/31/2023 at 8:50AM      Brief hospital course: [place]ri [place] is a 72 y.o. F with stage IV uterine cancer (s/p TAH, b/l SOO) with peritoneal carcinomatosis on active chemotherapy/immunotherapy, pAF on Xarelto, DM, and HTN who was admitted with intractable nausea and vomiting.     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 - Consult IR - Continue TPN - Consult Palliative Care - Post-PEG:  - Place PEG to LIS  - Remove NG - Clamp tube for 2 hours post-oral med administration  Uterine cancer (HCC) - Consult Oncology, appreciate cares - Chemo per Oncology  Pancytopenia (HCC) Due to chemo, stable  Hypothyroidism - Continue levothyroxine    Thrush - Continue micafungin and nystatin swish through 10/5 - Remove NG  Malnutrition of moderate degree Severe malnutrition ruled out.  Related to cancer and cancer related treatments as evidenced by mild muscle depletion, energy intake less than 75% for more than 1 month.  - Continue TPN - Consult dietitian  PAF (paroxysmal atrial fibrillation) (HCC) onset post-op Spring 2023 - Resume Xarelto tonight - Continue metoprolol and diltiazem    Hypokalemia Supplemented  Type 2 diabetes mellitus with neurological complications (HCC) Glucose controlled - Continue SS correction insulin   Essential hypertension AKI ruled out.  BP remains in 150s - Continue metoprolol and diltiazem   Chest pain ECG reviewed, all normal.  Doubt PE.          Subjective: Patient had some chest pain this morning, resolved spontaneously, felt like a gas bubble.  No confusion, fever, respiratory symptoms, no nursing concerns, no bleeding.     Physical Exam: BP (!) 156/68 (BP Location: Left Arm)   Pulse 100   Temp 97.8 F (36.6 C) (Oral)   Resp 18   Ht 5\' 3"   (1.6 m)   Wt 79.6 kg   SpO2 97%   BMI 31.09 kg/m   Elderly adult female, lying in bed, interactive and appropriate Tachycardic, irregular, no murmurs,, 1+ pitting edema bilaterally Respiratory rate normal, lungs clear without rales or wheezes Abdomen soft without tenderness palpation or guarding, no ascites or distention Attention normal, affect appropriate, judgment and insight appear normal    Data Reviewed: Discussed with oncology Basic metabolic panel shows hypomagnesemia otherwise metabolic panel normal CBC shows hemoglobin down to 7.9  Family Communication: None present    Disposition: Status is: Inpatient         Author: Alberteen Sam, MD 07/31/2023 2:00 PM  For on call review [email or web address].

## 2023-08-01 ENCOUNTER — Encounter: Payer: Medicare HMO | Admitting: Dietician

## 2023-08-01 ENCOUNTER — Ambulatory Visit: Payer: Medicare HMO | Admitting: Hematology and Oncology

## 2023-08-01 ENCOUNTER — Ambulatory Visit: Payer: Medicare HMO

## 2023-08-01 ENCOUNTER — Other Ambulatory Visit: Payer: Medicare HMO

## 2023-08-01 DIAGNOSIS — E669 Obesity, unspecified: Secondary | ICD-10-CM | POA: Insufficient documentation

## 2023-08-01 DIAGNOSIS — K567 Ileus, unspecified: Secondary | ICD-10-CM | POA: Diagnosis not present

## 2023-08-01 DIAGNOSIS — K311 Adult hypertrophic pyloric stenosis: Secondary | ICD-10-CM | POA: Diagnosis not present

## 2023-08-01 DIAGNOSIS — I10 Essential (primary) hypertension: Secondary | ICD-10-CM | POA: Diagnosis not present

## 2023-08-01 DIAGNOSIS — I480 Paroxysmal atrial fibrillation: Secondary | ICD-10-CM | POA: Diagnosis not present

## 2023-08-01 DIAGNOSIS — C55 Malignant neoplasm of uterus, part unspecified: Secondary | ICD-10-CM | POA: Diagnosis not present

## 2023-08-01 LAB — GLUCOSE, CAPILLARY
Glucose-Capillary: 155 mg/dL — ABNORMAL HIGH (ref 70–99)
Glucose-Capillary: 134 mg/dL — ABNORMAL HIGH (ref 70–99)
Glucose-Capillary: 143 mg/dL — ABNORMAL HIGH (ref 70–99)
Glucose-Capillary: 142 mg/dL — ABNORMAL HIGH (ref 70–99)
Glucose-Capillary: 132 mg/dL — ABNORMAL HIGH (ref 70–99)

## 2023-08-01 LAB — CBC
HCT: 24.8 % — ABNORMAL LOW (ref 36.0–46.0)
Hemoglobin: 7.6 g/dL — ABNORMAL LOW (ref 12.0–15.0)
MCH: 27.9 pg (ref 26.0–34.0)
MCHC: 30.6 g/dL (ref 30.0–36.0)
MCV: 91.2 fL (ref 80.0–100.0)
Platelets: 129 10*3/uL — ABNORMAL LOW (ref 150–400)
RBC: 2.72 MIL/uL — ABNORMAL LOW (ref 3.87–5.11)
RDW: 16 % — ABNORMAL HIGH (ref 11.5–15.5)
WBC: 2.2 10*3/uL — ABNORMAL LOW (ref 4.0–10.5)
nRBC: 0 % (ref 0.0–0.2)

## 2023-08-01 LAB — COMPREHENSIVE METABOLIC PANEL
ALT: 19 U/L (ref 0–44)
AST: 18 U/L (ref 15–41)
Albumin: 2.6 g/dL — ABNORMAL LOW (ref 3.5–5.0)
Alkaline Phosphatase: 119 U/L (ref 38–126)
Anion gap: 5 (ref 5–15)
BUN: 27 mg/dL — ABNORMAL HIGH (ref 8–23)
CO2: 27 mmol/L (ref 22–32)
Calcium: 8.7 mg/dL — ABNORMAL LOW (ref 8.9–10.3)
Chloride: 105 mmol/L (ref 98–111)
Creatinine, Ser: 0.66 mg/dL (ref 0.44–1.00)
GFR, Estimated: >60 mL/min (ref >60)
Glucose, Bld: 167 mg/dL — ABNORMAL HIGH (ref 70–99)
Potassium: 4 mmol/L (ref 3.5–5.1)
Sodium: 137 mmol/L (ref 135–145)
Total Bilirubin: 0.3 mg/dL (ref 0.3–1.2)
Total Protein: 5.8 g/dL — ABNORMAL LOW (ref 6.5–8.1)

## 2023-08-01 LAB — PHOSPHORUS: Phosphorus: 3.5 mg/dL (ref 2.5–4.6)

## 2023-08-01 LAB — PREPARE RBC (CROSSMATCH)

## 2023-08-01 LAB — MAGNESIUM: Magnesium: 1.8 mg/dL (ref 1.7–2.4)

## 2023-08-01 MED ORDER — MAGNESIUM SULFATE 2 GM/50ML IV SOLN
2.0000 g | Freq: Once | INTRAVENOUS | Status: AC
  Administered 2023-08-01: 2 g via INTRAVENOUS
  Filled 2023-08-01: qty 50

## 2023-08-01 MED ORDER — TRAMADOL HCL 50 MG PO TABS
50.0000 mg | ORAL_TABLET | Freq: Four times a day (QID) | ORAL | Status: DC | PRN
  Administered 2023-08-01 – 2023-08-08 (×10): 50 mg via ORAL
  Filled 2023-08-01 (×10): qty 1

## 2023-08-01 MED ORDER — CALCIUM CARBONATE ANTACID 500 MG PO CHEW
1.0000 | CHEWABLE_TABLET | Freq: Three times a day (TID) | ORAL | Status: DC | PRN
  Administered 2023-08-01: 200 mg via ORAL
  Filled 2023-08-01: qty 1

## 2023-08-01 MED ORDER — TRAVASOL 10 % IV SOLN
INTRAVENOUS | Status: AC
  Filled 2023-08-01: qty 900

## 2023-08-01 MED ORDER — SODIUM CHLORIDE 0.9% IV SOLUTION: Freq: Once | INTRAVENOUS | Status: AC

## 2023-08-01 NOTE — Assessment & Plan Note (Signed)
BMI 31 °

## 2023-08-01 NOTE — Progress Notes (Signed)
Mobility Specialist - Progress Note   08/01/23 1149  Mobility  Activity Ambulated independently in hallway  Level of Assistance Independent  Assistive Device None  Distance Ambulated (ft) 500 ft  Activity Response Tolerated well  Mobility Referral Yes  $Mobility charge 1 Mobility  Mobility Specialist Start Time (ACUTE ONLY) 1135  Mobility Specialist Stop Time (ACUTE ONLY) 1148  Mobility Specialist Time Calculation (min) (ACUTE ONLY) 13 min   Pt received in bed and agreeable to mobility. Upon returning to room, pt c/o abdomen pain rating it a 10/10. No other complaints during session. Pt to recliner after session with all needs met.    [facility]

## 2023-08-01 NOTE — Progress Notes (Signed)
Drain Location: LUQ Size: 20 FR Date of placement: 10/3 Currently to: Capped 24 hour output:  Output by Drain (mL) 07/30/23 0701 - 07/30/23 1900 07/30/23 1901 - 07/31/23 0700 07/31/23 0701 - 07/31/23 1900 07/31/23 1901 - 08/01/23 0700 08/01/23 0701 - 08/01/23 1555  Gastrostomy/Enterostomy Gastrostomy 20 Fr. LUQ   300 (P)300    Patient alert, sitting in chair. Dressing and sutures intact.  Small amount of dried blood on split gauze at insertion site. Abdomen tender to palpation.  Interval imaging/drain manipulation:    Current examination: Flushes/aspirates easily.  Insertion site unremarkable. Suture and stat lock in place. Dressed appropriately.   Plan: Continue TID flushes with 5 cc NS. Record output Q shift. Dressing changes QD or PRN if soiled.  Call IR APP or on call IR MD if difficulty flushing or sudden change in drain output.  Repeat imaging/possible drain injection once output < 10 mL/QD (excluding flush material). Consideration for drain removal if output is < 10 mL/QD (excluding flush material), pending discussion with the providing surgical service.  Discharge planning: Please contact IR APP or on call IR MD prior to patient d/c to ensure appropriate follow up plans are in place. Typically patient will follow up with IR clinic 10-14 days post d/c for repeat imaging/possible drain injection. IR scheduler will contact patient with date/time of appointment. Patient will need to flush drain QD with 5 cc NS, record output QD, dressing changes every 2-3 days or earlier if soiled.   IR will continue to follow - please call with questions or concerns.  Patient ID: [place]ri [place], female   DOB: 07/31/1951, 72 y.o.   MRN: [id number]

## 2023-08-01 NOTE — Progress Notes (Signed)
Sherri Gill   DOB:Jan 29, 1951   YN#:[id number]    ASSESSMENT & PLAN:   Recurrent uterine cancer She presents with partial small bowel obstruction She tolerated chemotherapy with carboplatin and paclitaxel 07/23/2023 In the meantime, continue supportive care   Nausea, vomiting and dehydration Feeling better after NG tube decompression I recommend trial of clamping NGT again, failed for second time She had venting gastrostomy tube placement on 10/3   Acquired pancytopenia Due to chemotherapy I recommend 1 unit of blood transfusion today   Dehydration, mild hypokalemia and other electrolyte imbalance, severe malnutrition Anticipate prolonged ileus and the patient is already malnourished Agree with TPN   Discharge planning Unknown, will follow All questions were answered. The patient knows to call the clinic with any problems, questions or concerns.   The total time spent in the appointment was 40 minutes encounter with patients including review of chart and various tests results, discussions about plan of care and coordination of care plan  Artis Delay, MD 08/01/2023 1:38 PM  Subjective:  She is seen in the room.  She is in good spirits since NG tube was removed.  Denies nausea or abdominal pain.  We discussed plan of care  Objective:  Vitals:   07/31/23 2004 08/01/23 0404  BP: (!) 150/73 (!) 139/59  Pulse: 95 83  Resp: 16 17  Temp: 98.7 F (37.1 C) 98.2 F (36.8 C)  SpO2: 97% 98%     Intake/Output Summary (Last 24 hours) at 08/01/2023 1338 Last data filed at 08/01/2023 0700 Gross per 24 hour  Intake 2155.07 ml  Output 900 ml  Net 1255.07 ml    GENERAL:alert, no distress and comfortable   Labs:  Recent Labs    07/30/23 0539 07/31/23 0557 08/01/23 0542  NA 136 138 137  K 4.5 4.3 4.0  CL 105 106 105  CO2 25 25 27   GLUCOSE 179* 168* 167*  BUN 33* 28* 27*  CREATININE 0.63 0.68 0.66  CALCIUM 9.1 9.0 8.7*  GFRNONAA >60 >60 >60  PROT 6.1* 5.9* 5.8*  ALBUMIN  2.7* 2.6* 2.6*  AST 18 13* 18  ALT 25 18 19   ALKPHOS 114 114 119  BILITOT 0.2* 0.2* 0.3    Studies:  IR GASTROSTOMY TUBE MOD SED  Result Date: 07/31/2023 INDICATION: 72 year old female with history of progressive gynecologic malignancy and bowel obstruction requiring percutaneous gastrostomy access for venting purposes. EXAM: PERC PLACEMENT GASTROSTOMY MEDICATIONS: Ancef 2 gm IV; Antibiotics were administered within 1 hour of the procedure. ANESTHESIA/SEDATION: Versed 2 mg IV; Fentanyl 100 mcg IV Moderate Sedation Time:  12 The patient was continuously monitored during the procedure by the interventional radiology nurse under my direct supervision. CONTRAST:  20mL OMNIPAQUE IOHEXOL 300 MG/ML SOLN - administered into the gastric lumen. FLUOROSCOPY TIME:  Ten mGy COMPLICATIONS: None immediate. PROCEDURE: Informed written consent was obtained from the patient after a thorough discussion of the procedural risks, benefits and alternatives. All questions were addressed. Maximal Sterile barrier Technique was utilized including caps, mask, sterile gowns, sterile gloves, sterile drape, hand hygiene and skin antiseptic. A timeout was performed prior to the initiation of the procedure. The patient was placed on the procedure table in the supine position. Pre-procedure abdominal film confirmed visualization of the transverse colon. The patient was prepped and draped in usual sterile fashion. The stomach was insufflated with air via the indwelling nasogastric tube. Under fluoroscopy, a puncture site was selected and local analgesia achieved with 1% lidocaine infiltrated subcutaneously. Under fluoroscopic guidance, a gastropexy needle  was passed into the stomach and the T-bar suture was released. Entry into the stomach was confirmed with fluoroscopy, aspiration of air, and injection of contrast material. This was repeated with an additional gastropexy suture (for a total of 2 fasteners). At the center of these  gastropexy sutures, a dermatotomy was performed. An 18 gauge needle was passed into the stomach at the site of this dermatotomy, and position within the gastric lumen again confirmed under fluoroscopy using aspiration of air and contrast injection. An Amplatz guidewire was passed through this needle and intraluminal placement within the stomach was confirmed by fluoroscopy. The needle was removed. Over the guidewire, the percutaneous tract was dilated using a 10 mm non-compliant balloon. The balloon was deflated, then pushed into the gastric lumen followed in concert by the 20 Fr gastrostomy tube. The retention balloon of the percutaneous gastrostomy tube was inflated with 10 mL of sterile water. The tube was withdrawn until the retention balloon was at the edge of the gastric lumen. The external bumper was brought to the abdominal wall. Contrast was injected through the gastrostomy tube, confirming intraluminal positioning. The patient tolerated the procedure well without any immediate post-procedural complications. IMPRESSION: Technically successful placement of 20 Fr gastrostomy tube. PLAN: Plan for routine 6 month gastrostomy tube exchange. Marliss Coots, MD [facility] Radiology Electronically Signed   By: Marliss Coots M.D.   On: 07/31/2023 11:26   DG Chest 2 View  Result Date: 07/30/2023 CLINICAL DATA:  Hospital-acquired pneumonia. EXAM: CHEST - 2 VIEW COMPARISON:  Chest/abdominal radiographs 07/18/2023 FINDINGS: A right jugular Port-A-Cath remains in place with tip near the superior cavoatrial junction. An enteric tube courses into the abdomen with tip not imaged and with side port near the GE junction. The cardiomediastinal silhouette is unchanged with normal heart size. Aortic atherosclerosis is noted. The lungs are hypoinflated. No confluent airspace opacity, edema, pleural effusion, or pneumothorax is identified. No acute osseous abnormality is seen.  IMPRESSION: No active cardiopulmonary disease. Electronically Signed   By: Sebastian Ache M.D.   On: 07/30/2023 13:44   DG Abd 1 View  Result Date: 07/30/2023 CLINICAL DATA:  Nasogastric tube placement EXAM: ABDOMEN - 1 VIEW COMPARISON:  07/27/2023 FINDINGS: Enteric tube with tip at the stomach which appears decompressed. No gas dilated bowel is seen, there is partial coverage of the right abdomen. Rounded densities over the left flank are within colonic diverticula based on prior CT. IMPRESSION: Enteric tube with tip at the stomach. Electronically Signed   By: Tiburcio Pea M.D.   On: 07/30/2023 04:36   DG Abd 2 Views  Result Date: 07/27/2023 CLINICAL DATA:  Abdominal pain EXAM: ABDOMEN - 2 VIEW COMPARISON:  07/22/2023 FINDINGS: Gastric catheter is again seen in the stomach. No free air is noted. Right chest wall port is seen. Contrast material is noted in multiple colonic diverticula. A few mildly prominent loops of small bowel are noted in the central abdomen. This is somewhat increased when compare with the prior CT of 07/12/2023. Bony abnormality is noted. IMPRESSION: Slight increase in central small bowel dilatation. CT may be helpful for further evaluation. Electronically Signed   By: Alcide Clever M.D.   On: 07/27/2023 20:13   DG Abd 1 View  Result Date: 07/22/2023 CLINICAL DATA:  [id number] Encounter for nasogastric (NG) tube placement [id number]. EXAM: ABDOMEN - 1 VIEW COMPARISON:  Abdominal radiograph 07/19/2023. FINDINGS: Right chest Port-A-Cath tip projects over the right atrium. Enteric tube tip and side port project  over the stomach. Scant retained contrast in visualized portions of the colon. IMPRESSION: Enteric tube tip and side port project over the stomach. Electronically Signed   By: Orvan Falconer M.D.   On: 07/22/2023 14:27   DG Abd 1 View  Result Date: 07/19/2023 CLINICAL DATA:  [id number] SBO (small bowel obstruction) (HCC) [id number] EXAM: ABDOMEN - 1 VIEW COMPARISON:  July 18, 2023  FINDINGS: Enteric contrast has progressed to the rectum. Multiple colonic diverticuli. No dilated loops of bowel are seen. IMPRESSION: Enteric contrast has progressed to the rectum. Electronically Signed   By: Meda Klinefelter M.D.   On: 07/19/2023 10:22   DG UGI W SINGLE CM (SOL OR THIN BA)  Result Date: 07/18/2023 CLINICAL DATA:  Endometrial/uterine cancer. Fold thickening in the stomach on recent endoscopy. Reflux and bloating. Difficulty keeping foods and liquids down. EXAM: UPPER GI SERIES WITH KUB TECHNIQUE: After obtaining a scout radiograph a routine upper GI series was performed using thin barium. Mobility was mildly limited on today's exam, for example the patient was unable to turn prone. FLUOROSCOPY: Radiation Exposure Index (as provided by the fluoroscopic device): 21.7 mGy Kerma COMPARISON:  CT abdomen 07/12/2023 FINDINGS: Initial KUB unremarkable aside from mild levoconvex lumbar scoliosis. The pharyngeal phase of swallowing appears normal. Primary peristaltic waves were intact on 4/4 swallows. Small type 1 hiatal hernia. Occasional gastroesophageal reflux was observed on today's exam. I used mucosal relief and balloon compression of the stomach in order to help mitigate the use of thin barium and single contrast technique as well as the patient is inability to perform prone imaging. These demonstrate a gastric fold thickening as shown on prior CT and prior endoscopy. The duodenum is mildly dilated, especially proximally. Eventually this proceeded on to the fourth portion of the duodenum as shown on image 1 series 15. However, the contrast column was sluggish to flow through the duodenum, and given the somewhat dilated appearance, the possibility of obstruction distally is not excluded. Consider following the barium column with serial KUB use in assessing for small bowel obstruction in several hours. A 13 mm barium tablet passed briskly into the stomach. Port-A-Cath noted. IMPRESSION: 1. Gastric  fold thickening as shown on prior CT and endoscopy. 2. Small type 1 hiatal hernia. 3. Intermittent gastroesophageal reflux. 4. Mildly dilated duodenum, with sluggish flow of contrast through the duodenum. The possibility of a more distal small bowel obstruction is not excluded given this appearance. Consider KUB follow-up in several hours to assess for transit of the barium column through the small bowel in assessing for small bowel obstruction. Electronically Signed   By: Gaylyn Rong M.D.   On: 07/18/2023 12:00   DG Abd Portable 1 View  Result Date: 07/17/2023 CLINICAL DATA:  Nasogastric tube placement. EXAM: PORTABLE ABDOMEN - 1 VIEW COMPARISON:  Abdomen and pelvis CT dated 07/12/2023. FINDINGS: Normal bowel-gas pattern. Interval nasogastric tube with its tip and side hole in the proximal to mid stomach. Lower thoracic spine degenerative changes. IMPRESSION: Nasogastric tube tip and side hole in the proximal to mid stomach. Electronically Signed   By: Beckie Salts M.D.   On: 07/03/2023 17:09   DG Abdomen Acute W/Chest  Result Date: 07/24/2023 CLINICAL DATA:  Vomiting EXAM: DG ABDOMEN ACUTE WITH 1 VIEW CHEST COMPARISON:  07/10/2023. FINDINGS: There is no evidence of dilated bowel loops or free intraperitoneal air. No radiopaque calculi or other significant radiographic abnormality is seen. Heart size and mediastinal contours are within normal limits. Both lungs are clear. There  are thoracic degenerative changes. Right-sided Port-A-Cath tip overlies distal SVC. IMPRESSION: Negative abdominal radiographs.  No acute cardiopulmonary disease. Electronically Signed   By: Layla Maw M.D.   On: 07/28/2023 14:40   CT ABDOMEN PELVIS W CONTRAST  Result Date: 07/12/2023 CLINICAL DATA:  Endometrial/uterine cancer.  Nausea and vomiting. * Tracking Code: BO * EXAM: CT ABDOMEN AND PELVIS WITH CONTRAST TECHNIQUE: Multidetector CT imaging of the abdomen and pelvis was performed using the standard protocol  following bolus administration of intravenous contrast. RADIATION DOSE REDUCTION: This exam was performed according to the departmental dose-optimization program which includes automated exposure control, adjustment of the mA and/or kV according to patient size and/or use of iterative reconstruction technique. CONTRAST:  OMNIPAQUE IOHEXOL 300 MG/ML  SOLN COMPARISON:  CT abdomen/pelvis from 06/17/2023 FINDINGS: Lower chest: A catheter terminates in the right atrium on the top most image. Mild cardiomegaly. Hepatobiliary: Unremarkable Pancreas: Unremarkable Spleen: Unremarkable Adrenals/Urinary Tract: Unremarkable Stomach/Bowel: Diffuse gastric wall thickening with mild gastric and substantial proximal duodenal distension. After crossing the midline, the duodenum does not appear distended but there is substantial distal duodenal and jejunal wall thickening, worsened from prior exams. Scattered colonic diverticula. Appendix unremarkable. No pneumatosis. No hypoenhancement of bowel wall. There are abnormal contains small fluid collections along the small bowel mesentery, for example along the left abdomen on image 64 series 7 and along the right abdomen on image 55 series 7. In addition there is fluid density in the lesser sac on image 76 series 7. Vascular/Lymphatic: Mild atheromatous vascular disease of the aortoiliac tree. Patent celiac trunk and SMA. Patent superior mesenteric vein. Suspected left iliac adenopathy measuring about 1.3 cm in diameter on image 47 series 2, stable. This is of similar density to the adjacent vein. Reproductive: Uterus absent. Nodular enhancement along the left vaginal cuff measuring 2.5 by 1.7 cm, previously 2.3 by 1.7 cm by my measurements. Tumor in this location is not excluded. Other: There is some abnormal stranding along the remaining omentum for example on image 73 series 9. Small nodular deposit along the upper omentum on image 31 series 2. Musculoskeletal: Lower thoracic  and lumbar spondylosis. Density within the umbilicus on image 102 series 9, potentially a small knuckle of bowel versus tumor. IMPRESSION: 1. Worsening of gastric wall thickening, distal duodenal wall thickening, and jejunal wall thickening. The stomach and proximal duodenum are distended. The appearance favors gastroenteritis. 2. There is also some abnormal stranding along the remaining omentum. There are also abnormal fluid collections along the small bowel mesentery and in the lesser sac. The appearance raises concern for possible residual peritoneal tumor causing fluid deposits. Small nodular deposit along the upper omentum along with nodularity in the umbilicus. 3. Nodular enhancement along the left vaginal cuff measuring 2.5 by 1.7 cm, previously 2.3 by 1.7 cm by my measurements. This likely represents tumor. 4. Suspected left iliac adenopathy measuring about 1.3 cm in diameter, stable. 5. Mild cardiomegaly. 6. Scattered colonic diverticula. 7. Mild atheromatous vascular disease of the aortoiliac tree. 8. Lower thoracic and lumbar spondylosis. Electronically Signed   By: Gaylyn Rong M.D.   On: 07/12/2023 14:56   DG Abd 1 View  Result Date: 07/10/2023 CLINICAL DATA:  Abdominal pain, vomiting. History of uterine cancer. EXAM: ABDOMEN - 1 VIEW COMPARISON:  April 09, 2022. FINDINGS: The bowel gas pattern is normal. Surgical clip is seen over the left sacrum. No radio-opaque calculi or other significant radiographic abnormality are seen. IMPRESSION: No abnormal bowel dilatation. Electronically Signed  By: Lupita Raider M.D.   On: 07/10/2023 14:18

## 2023-08-01 NOTE — Progress Notes (Signed)
Progress Note   Patient: Sherri Gill ZOX:[id number] DOB: [birth year] DOA: 07/24/2023     18 DOS: the patient was seen and examined on 08/01/2023 at 11:04AM      Brief hospital course: Sherri Gill is a 72 y.o. F with stage IV uterine cancer (s/p TAH, b/l SOO) with peritoneal carcinomatosis on active chemotherapy/immunotherapy, pAF on Xarelto, DM, and HTN who was admitted with intractable nausea and vomiting.     Assessment and Plan: * Refractory ileus due to carcinomatosis S/p venting PEG on 10/3.  Feels better with NG out.   - Keep PEG open to gravity overnight, clamped during the day - Clears today, and hopefully ADAT tomorrow - Continue TPN  Uterine cancer (HCC) Peritoneal implants leading to ileus.  Hopefully chemo will being to shrink this over the next few weeks and TPN can be weaned. - Consult Oncology, appreciate cares - Chemo per Oncology  Pancytopenia [facility]) Due to chemo. Hgb trending down to 7.6 today, Oncology recommend transfusion - Transfuse PRBCs  Hypothyroidism - Continue levothyroxine    Thrush - Continue Nystatin   Malnutrition of moderate degree Severe malnutrition ruled out.  Related to cancer and cancer related treatments as evidenced by mild muscle depletion, energy intake less than 75% for more than 1 month.  - Continue TPN - Consult dietitian  PAF (paroxysmal atrial fibrillation) (HCC) onset post-op Spring 2023 - Continue Xarelto - Continue metoprolol and diltiazem    Hypokalemia Supplemented  Type 2 diabetes mellitus with neurological complications (HCC) Glucose controlled - Continue SS correction insulin   Essential hypertension AKI ruled out.  BP remains in 150s - Continue metoprolol and diltiazem           Subjective: Feeling quite a bit better, ambulating in the halls, no fever, confusion.  No nursing concerns.  No clinical bleeding.     Physical Exam: BP (!) 139/59 (BP Location: Right Arm)   Pulse 83   Temp 98.2 F  (36.8 C) (Oral)   Resp 17   Ht 5\' 3"  (1.6 m)   Wt 79.6 kg   SpO2 98%   BMI 31.09 kg/m   Adult female, sitting up in bed, interactive and appropriate RRR, no murmurs, no peripheral edema Respiratory normal, lungs clear without rales or wheezes Abdomen soft without tenderness ovation or guarding Attention normal, affect normal, judgment and insight appear normal   Data Reviewed: Discussed with oncology Basic metabolic panel shows no abnormalities CBC shows pancytopenia, hemoglobin down to 7.6  Family Communication: Friend at the bedside    Disposition: Status is: Inpatient         Author: Alberteen Sam, MD 08/01/2023 2:05 PM  For on call review [email or web address].

## 2023-08-01 NOTE — Progress Notes (Signed)
PHARMACY - TOTAL PARENTERAL NUTRITION CONSULT NOTE   Indication: Prolonged ileus  Patient Measurements: Height: 5\' 3"  (160 cm) Weight: 79.6 kg (175 lb 7.8 oz) IBW/kg (Calculated) : 52.4 TPN AdjBW (KG): 59.5 Body mass index is 31.09 kg/m.  Assessment:  [name] admitted 9/16 for nausea/vomiting x 2.5 weeks. Patient with h/o recurrent uterine cancer--Bx taken during 9/18 small bowel enteroscopy but no signs of malignant obstruction, negative for malignancy. Oncology recommended resumption of chemotherapy during this admission--carboplatin and paclitaxel completed 9/25.   Patient has severe malnutrition given inability to keep food down with continued N/V during this admission and PTA. Pharmacy consulted for TPN management.  - Carcinomatosis causing delayed gastric motility   Glucose / Insulin: Hx of T2DM - sSSI q4h (used 11 units in 24 hrs) - CBGs (goal 60-150): 133- 169  Electrolytes: Na 137 (max Na conc in TPN), Mag 1.8 after 2 gm given yesterday, phos 3.5; CorrCa 9.82 other lytes wnl   Renal: Scr <1; BUN elevated but down 27  Hepatic: -LFTs WNL - Albumin low  - TG 100 (9/30)  Intake / Output; MIVF:  - I/O: + 1055 mL - UOP: 2x - Last BM: 9/30 but unmeasured stool documented 10/1 - Emesis/NG: 200 - NGT removed -G tube drain - 500 ml - MIVF: NS at 63ml/hr stopped 10/3 at 1835 per [facility] but order still active  GI Meds: Compazine 5 every 6 hrs prn  refractory nausea, Pepcid 20 IV/hs,  Zofran 4 every 6 as needed and pantoprazole 40 IV BID   GI Imaging: - 9/20 DG UGI: Gastric fold thickening as shown on prior CT and endoscopy. Mildly dilated duodenum, with sluggish flow of contrast through the duodenum--possibility of a more distal SBO is not excluded given this appearance. - 9/29 abd Xray: Slight increase in central small bowel dilatation.   GI Surgeries / Procedures:  - 9/18: small bowel endoscopy: Normal appearing, widely patent esophagus and GEJ. Edematous gastric  folds-biopsied. A single duodenal polyp in the post-bulbar duodenum-biopsied 10/3 placement of gastrostomy tube today to be used for venting  Central access: port TPN start date:  9/26  Nutritional Goals: Goal TPN rate is 75 mL/hr (provides 90 g of protein and 1746 kcals per day)  RD Assessment: Estimated Needs Total Energy Estimated Needs: 1650-1850 Total Protein Estimated Needs: 85-100g Total Fluid Estimated Needs: 1.8L/day  Current Nutrition:  - TPN - completed thiamine 100mg  daily x5 days (9/26-9/30) NPO Ice chips and sips with meds  Plan:  Now:  - Magnesium sulfate 2gm IV x1  Today, at 1800:  - Continue TPN at goal rate of 75 mL/hr - Electrolytes in TPN:  Na 150 mEq/L maxed K 50Eq/L  Ca 67mEq/L Mg 75mEq/L Phos 10 mmol/L Cl:Ac 1:1 - Add standard MVI and trace elements to TPN - continue Sensitive q4h SSI and adjust as needed  - Monitor TPN labs on Mon/Thurs - f/u diet advancement now that venting G tube placed - f/u ability to change IV PPI & Pepcid to po  Herby Abraham, Pharm.D Use secure chat for questions 08/01/2023 8:07 AM

## 2023-08-01 NOTE — Progress Notes (Signed)
Instructed by Joen Laura, MD not to order 2 hour post transfusion H&H as he will order labs to recheck tomorrow morning 08/02/2023.

## 2023-08-01 NOTE — Plan of Care (Signed)
  Problem: Pain Managment: Goal: General experience of comfort will improve Outcome: Progressing   Problem: Safety: Goal: Ability to remain free from injury will improve Outcome: Progressing   Problem: Skin Integrity: Goal: Risk for impaired skin integrity will decrease Outcome: Progressing   

## 2023-08-02 DIAGNOSIS — I10 Essential (primary) hypertension: Secondary | ICD-10-CM | POA: Diagnosis not present

## 2023-08-02 DIAGNOSIS — K567 Ileus, unspecified: Secondary | ICD-10-CM | POA: Diagnosis not present

## 2023-08-02 DIAGNOSIS — E876 Hypokalemia: Secondary | ICD-10-CM

## 2023-08-02 DIAGNOSIS — E669 Obesity, unspecified: Secondary | ICD-10-CM

## 2023-08-02 DIAGNOSIS — Z515 Encounter for palliative care: Secondary | ICD-10-CM | POA: Diagnosis not present

## 2023-08-02 DIAGNOSIS — E039 Hypothyroidism, unspecified: Secondary | ICD-10-CM

## 2023-08-02 DIAGNOSIS — C55 Malignant neoplasm of uterus, part unspecified: Secondary | ICD-10-CM | POA: Diagnosis not present

## 2023-08-02 LAB — BPAM RBC
Blood Product Expiration Date: 202410312359
ISSUE DATE / TIME: 202410041559
Unit Type and Rh: 5100

## 2023-08-02 LAB — TYPE AND SCREEN
ABO/RH(D): O POS
Antibody Screen: NEGATIVE
Unit division: 0

## 2023-08-02 LAB — GLUCOSE, CAPILLARY
Glucose-Capillary: 128 mg/dL — ABNORMAL HIGH (ref 70–99)
Glucose-Capillary: 135 mg/dL — ABNORMAL HIGH (ref 70–99)
Glucose-Capillary: 137 mg/dL — ABNORMAL HIGH (ref 70–99)
Glucose-Capillary: 146 mg/dL — ABNORMAL HIGH (ref 70–99)
Glucose-Capillary: 152 mg/dL — ABNORMAL HIGH (ref 70–99)
Glucose-Capillary: 154 mg/dL — ABNORMAL HIGH (ref 70–99)

## 2023-08-02 LAB — CBC
HCT: 29.4 % — ABNORMAL LOW (ref 36.0–46.0)
Hemoglobin: 9.4 g/dL — ABNORMAL LOW (ref 12.0–15.0)
MCH: 28.3 pg (ref 26.0–34.0)
MCHC: 32 g/dL (ref 30.0–36.0)
MCV: 88.6 fL (ref 80.0–100.0)
Platelets: 116 10*3/uL — ABNORMAL LOW (ref 150–400)
RBC: 3.32 MIL/uL — ABNORMAL LOW (ref 3.87–5.11)
RDW: 15.4 % (ref 11.5–15.5)
WBC: 2.8 10*3/uL — ABNORMAL LOW (ref 4.0–10.5)
nRBC: 0.7 % — ABNORMAL HIGH (ref 0.0–0.2)

## 2023-08-02 LAB — COMPREHENSIVE METABOLIC PANEL
ALT: 21 U/L (ref 0–44)
AST: 22 U/L (ref 15–41)
Albumin: 2.5 g/dL — ABNORMAL LOW (ref 3.5–5.0)
Alkaline Phosphatase: 128 U/L — ABNORMAL HIGH (ref 38–126)
Anion gap: 8 (ref 5–15)
BUN: 24 mg/dL — ABNORMAL HIGH (ref 8–23)
CO2: 26 mmol/L (ref 22–32)
Calcium: 8.7 mg/dL — ABNORMAL LOW (ref 8.9–10.3)
Chloride: 100 mmol/L (ref 98–111)
Creatinine, Ser: 0.69 mg/dL (ref 0.44–1.00)
GFR, Estimated: >60 mL/min (ref >60)
Glucose, Bld: 166 mg/dL — ABNORMAL HIGH (ref 70–99)
Potassium: 4.7 mmol/L (ref 3.5–5.1)
Sodium: 134 mmol/L — ABNORMAL LOW (ref 135–145)
Total Bilirubin: 0.6 mg/dL (ref 0.3–1.2)
Total Protein: 5.9 g/dL — ABNORMAL LOW (ref 6.5–8.1)

## 2023-08-02 MED ORDER — SENNA 8.6 MG PO TABS
1.0000 | ORAL_TABLET | Freq: Every day | ORAL | Status: DC
  Administered 2023-08-03 – 2023-08-10 (×6): 8.6 mg via ORAL
  Filled 2023-08-02 (×8): qty 1

## 2023-08-02 MED ORDER — FAMOTIDINE 20 MG PO TABS
20.0000 mg | ORAL_TABLET | Freq: Every day | ORAL | Status: DC
  Administered 2023-08-02 – 2023-08-07 (×5): 20 mg via ORAL
  Filled 2023-08-02 (×5): qty 1

## 2023-08-02 MED ORDER — TRAVASOL 10 % IV SOLN
INTRAVENOUS | Status: AC
  Filled 2023-08-02: qty 900

## 2023-08-02 MED ORDER — POLYETHYLENE GLYCOL 3350 17 G PO PACK
17.0000 g | PACK | Freq: Every day | ORAL | Status: DC
  Administered 2023-08-04 – 2023-08-10 (×5): 17 g via ORAL
  Filled 2023-08-02 (×8): qty 1

## 2023-08-02 NOTE — Progress Notes (Signed)
PHARMACY - TOTAL PARENTERAL NUTRITION CONSULT NOTE   Indication: Prolonged ileus  Patient Measurements: Height: 5\' 3"  (160 cm) Weight: 79.7 kg (175 lb 11.3 oz) IBW/kg (Calculated) : 52.4 TPN AdjBW (KG): 59.5 Body mass index is 31.13 kg/m.  Assessment:  [name] admitted 9/16 for nausea/vomiting x 2.5 weeks. Patient with h/o recurrent uterine cancer--Bx taken during 9/18 small bowel enteroscopy but no signs of malignant obstruction, negative for malignancy. Oncology recommended resumption of chemotherapy during this admission--carboplatin and paclitaxel completed 9/25.   Patient has severe malnutrition given inability to keep food down with continued N/V during this admission and PTA. Pharmacy consulted for TPN management.  - Carcinomatosis causing delayed gastric motility   Glucose / Insulin: Hx of T2DM - sSSI q4h (used 8 units in 24 hrs) - CBGs (goal 60-150): 132-152  Electrolytes: Na 137 (max Na conc in TPN), Mag 1.8 after 2 gm given yesterday, phos 3.5; CorrCa 9.82 other lytes wnl   Renal: Scr <1; BUN elevated but down 27  Hepatic: -LFTs WNL - Albumin low  - TG 100 (9/30)  Intake / Output; MIVF:  - I/O: + 2059 mL - UOP: 2x - Last BM: 10/2  - Emesis/NG: NGT out - po intake -G tube drain - 500 ml - MIVF: NS at 4ml/hr  GI Meds: Compazine 5 every 6 hrs prn  refractory nausea, Pepcid 20 IV/hs,  Zofran 4 every 6 as needed and pantoprazole 40 IV BID   GI Imaging: - 9/20 DG UGI: Gastric fold thickening as shown on prior CT and endoscopy. Mildly dilated duodenum, with sluggish flow of contrast through the duodenum--possibility of a more distal SBO is not excluded given this appearance. - 9/29 abd Xray: Slight increase in central small bowel dilatation.   GI Surgeries / Procedures:  - 9/18: small bowel endoscopy: Normal appearing, widely patent esophagus and GEJ. Edematous gastric folds-biopsied. A single duodenal polyp in the post-bulbar duodenum-biopsied 10/3  placement of gastrostomy tube today to be used for venting  Central access: port TPN start date:  9/26  Nutritional Goals: Goal TPN rate is 75 mL/hr (provides 90 g of protein and 1746 kcals per day)  RD Assessment: Estimated Needs Total Energy Estimated Needs: 1650-1850 Total Protein Estimated Needs: 85-100g Total Fluid Estimated Needs: 1.8L/day  Current Nutrition:  - TPN 9/26>> - completed thiamine 100mg  daily x5 days (9/26-9/30) - 10/4: CLD  Plan:  Today, at 1800:  - Continue TPN at goal rate of 75 mL/hr - Electrolytes in TPN:  Na 150 mEq/L maxed K 50Eq/L  Ca 6mEq/L Mg 23mEq/L Phos 10 mmol/L Cl:Ac 1:1 - Add standard MVI and trace elements to TPN - continue Sensitive q4h SSI and adjust as needed  - Monitor TPN labs on Mon/Thurs and PRN - f/u diet advancement now that venting G tube placed: CLD - f/u ability to change IV PPI & Pepcid to po  10/5: No change to TPN formula today. F/u diet advancement  Sherri Gill, PharmD, BCPS Clinical Staff Pharmacist [email or web address] 08/02/2023 9:59 AM

## 2023-08-02 NOTE — Progress Notes (Signed)
Progress Note   Patient: Sherri Gill HKV:[id number] DOB: 1951/02/27 DOA: 07/09/2023     19 DOS: the patient was seen and examined on 08/02/2023 at 11:04AM      Brief hospital course: [name] is a 72 y.o. F with stage IV uterine cancer (s/p TAH, b/l SOO) with peritoneal carcinomatosis on active chemotherapy/immunotherapy, pAF on Xarelto, DM, and HTN who was admitted with intractable nausea and vomiting.     Assessment and Plan: * Refractory ileus due to carcinomatosis S/p venting PEG on 10/3.      - Keep PEG open to gravity overnight, clamped during the day - Continue clear liquid diet - Continue TPN  Uterine cancer (HCC) -Consult oncology  Obesity (BMI 30-39.9) BMI 31  Pancytopenia (HCC) Status posttransfusion 10/4, hemoglobin up appropriately after - Trend CBC  Hypothyroidism - Continue levothyroxine    Malnutrition of moderate degree -Continue TPN  PAF (paroxysmal atrial fibrillation) (HCC) onset post-op Spring 2023 - Continue Xarelto - Continue metoprolol and diltiazem    Type 2 diabetes mellitus with neurological complications (HCC) Glucose controlled - Continue SS correction insulin   Essential hypertension AKI ruled out.  BP remains in 150s - Continue metoprolol and diltiazem           Subjective: Patient has some pain with coughing, she has no fever, she has had no bleeding from her site of her PEG, she had a little bloating and indigestion yesterday evening     Physical Exam: BP (!) 148/62 (BP Location: Right Arm)   Pulse 79   Temp 99.1 F (37.3 C) (Oral)   Resp 16   Ht 5\' 3"  (1.6 m)   Wt 79.7 kg   SpO2 93%   BMI 31.13 kg/m   Adult female, lying bed, interactive and appropriate RRR, no murmurs, no peripheral edema Respiratory rate normal, lungs clear without rales or wheezes Abdomen soft, some mild tenderness near her PEG site which appears clean dry and intact Attention normal, affect appropriate, judgment and insight appear  normal   Data Reviewed: CBC shows pancytopenia, hemoglobin appropriately up Basic metabolic panel unremarkable  Family Communication:      Disposition: Status is: Inpatient         Author: Alberteen Sam, MD 08/02/2023 3:40 PM  For on call review [email or web address]

## 2023-08-03 DIAGNOSIS — C55 Malignant neoplasm of uterus, part unspecified: Secondary | ICD-10-CM | POA: Diagnosis not present

## 2023-08-03 DIAGNOSIS — K567 Ileus, unspecified: Secondary | ICD-10-CM | POA: Diagnosis not present

## 2023-08-03 DIAGNOSIS — I10 Essential (primary) hypertension: Secondary | ICD-10-CM | POA: Diagnosis not present

## 2023-08-03 DIAGNOSIS — E876 Hypokalemia: Secondary | ICD-10-CM | POA: Diagnosis not present

## 2023-08-03 LAB — COMPREHENSIVE METABOLIC PANEL
ALT: 26 U/L (ref 0–44)
AST: 26 U/L (ref 15–41)
Albumin: 2.4 g/dL — ABNORMAL LOW (ref 3.5–5.0)
Alkaline Phosphatase: 138 U/L — ABNORMAL HIGH (ref 38–126)
Anion gap: 10 (ref 5–15)
BUN: 25 mg/dL — ABNORMAL HIGH (ref 8–23)
CO2: 29 mmol/L (ref 22–32)
Calcium: 9 mg/dL (ref 8.9–10.3)
Chloride: 96 mmol/L — ABNORMAL LOW (ref 98–111)
Creatinine, Ser: 0.63 mg/dL (ref 0.44–1.00)
GFR, Estimated: >60 mL/min (ref >60)
Glucose, Bld: 159 mg/dL — ABNORMAL HIGH (ref 70–99)
Potassium: 4.1 mmol/L (ref 3.5–5.1)
Sodium: 135 mmol/L (ref 135–145)
Total Bilirubin: 0.5 mg/dL (ref 0.3–1.2)
Total Protein: 6 g/dL — ABNORMAL LOW (ref 6.5–8.1)

## 2023-08-03 LAB — GLUCOSE, CAPILLARY
Glucose-Capillary: 130 mg/dL — ABNORMAL HIGH (ref 70–99)
Glucose-Capillary: 149 mg/dL — ABNORMAL HIGH (ref 70–99)
Glucose-Capillary: 151 mg/dL — ABNORMAL HIGH (ref 70–99)
Glucose-Capillary: 157 mg/dL — ABNORMAL HIGH (ref 70–99)
Glucose-Capillary: 163 mg/dL — ABNORMAL HIGH (ref 70–99)
Glucose-Capillary: 207 mg/dL — ABNORMAL HIGH (ref 70–99)

## 2023-08-03 MED ORDER — TRAVASOL 10 % IV SOLN
INTRAVENOUS | Status: AC
  Filled 2023-08-03: qty 900

## 2023-08-03 MED ORDER — PANTOPRAZOLE SODIUM 40 MG PO TBEC
40.0000 mg | DELAYED_RELEASE_TABLET | Freq: Two times a day (BID) | ORAL | Status: DC
  Administered 2023-08-03 – 2023-08-08 (×11): 40 mg via ORAL
  Filled 2023-08-03 (×12): qty 1

## 2023-08-03 NOTE — TOC Progression Note (Signed)
Transition of Care [facility]) - Progression Note    Patient Details  Name: Sherri Gill MRN: [id number] Date of Birth: 09/13/1951  Transition of Care [facility]) CM/SW Contact  Darleene Cleaver, [place] Phone Number: 08/03/2023, 6:25 PM  Clinical Narrative:     TOC continuing to follow patient in case of any needs.  Currently no TOC needs at this time.  Expected Discharge Plan: Home/Self Care Barriers to Discharge: Continued Medical Work up  Expected Discharge Plan and Services In-house Referral: NA Discharge Planning Services: CM Consult Post Acute Care Choice: NA Living arrangements for the past 2 months: Apartment                 DME Arranged: N/A                     Social Determinants of Health (SDOH) Interventions SDOH Screenings   Food Insecurity: No Food Insecurity (07/14/2023)  Housing: Low Risk  (07/14/2023)  Transportation Needs: No Transportation Needs (07/14/2023)  Utilities: Not At Risk (07/14/2023)  Alcohol Screen: Low Risk  (02/20/2023)  Depression (PHQ2-9): Medium Risk (03/25/2023)  Financial Resource Strain: Medium Risk (02/20/2023)  Physical Activity: Inactive (02/20/2023)  Social Connections: Unknown (02/20/2023)  Stress: Stress Concern Present (02/20/2023)  Tobacco Use: Medium Risk (07/14/2023)    Readmission Risk Interventions    07/16/2023   11:28 AM  Readmission Risk Prevention Plan  Transportation Screening Complete  PCP or Specialist Appt within 3-5 Days Complete  HRI or Home Care Consult Complete  Social Work Consult for Recovery Care Planning/Counseling Complete  Palliative Care Screening Not Applicable  Medication Review Oceanographer) Referral to Pharmacy

## 2023-08-03 NOTE — Progress Notes (Signed)
PHARMACY - TOTAL PARENTERAL NUTRITION CONSULT NOTE   Indication: Prolonged ileus  Patient Measurements: Height: 5\' 3"  (160 cm) Weight: 81.1 kg (178 lb 12.7 oz) IBW/kg (Calculated) : 52.4 TPN AdjBW (KG): 59.5 Body mass index is 31.67 kg/m.  Assessment:  [name] admitted 9/16 for nausea/vomiting x 2.5 weeks. Patient with h/o recurrent uterine cancer--Bx taken during 9/18 small bowel enteroscopy but no signs of malignant obstruction, negative for malignancy. Oncology recommended resumption of chemotherapy during this admission--carboplatin and paclitaxel completed 9/25.   Patient has severe malnutrition given inability to keep food down with continued N/V during this admission and PTA. Pharmacy consulted for TPN management.  - Carcinomatosis causing delayed gastric motility   Glucose / Insulin: Hx of T2DM - sSSI q4h (used 11 units in 24 hrs) - CBGs (goal 60-150):128-207 (high this AM)  Electrolytes: Na 135 (max Na conc in TPN), CorrCa 10.28  Renal: Scr <1; BUN elevated but down 27  Hepatic: -LFTs WNL - Albumin low  - TG 100 (9/30)  Intake / Output; MIVF:  - I/O: + 869 mL - UOP: 2x - Last BM: 10/5  - Emesis/NG: NGT out - po intake: none recorded - G tube drain - none recorded - MIVF: NS at 41ml/hr  GI Meds: Compazine 5 every 6 hrs prn  refractory nausea, Pepcid 20 po/hs,  Zofran 4 every 6 as needed and pantoprazole 40 po BID   GI Imaging: - 9/20 DG UGI: Gastric fold thickening as shown on prior CT and endoscopy. Mildly dilated duodenum, with sluggish flow of contrast through the duodenum--possibility of a more distal SBO is not excluded given this appearance. - 9/29 abd Xray: Slight increase in central small bowel dilatation.   GI Surgeries / Procedures:  - 9/18: small bowel endoscopy: Normal appearing, widely patent esophagus and GEJ. Edematous gastric folds-biopsied. A single duodenal polyp in the post-bulbar duodenum-biopsied 10/3 placement of gastrostomy tube  today to be used for venting  Central access: port TPN start date:  9/26  Nutritional Goals: Goal TPN rate is 75 mL/hr (provides 90 g of protein and 1746 kcals per day)  RD Assessment: Estimated Needs Total Energy Estimated Needs: 1650-1850 Total Protein Estimated Needs: 85-100g Total Fluid Estimated Needs: 1.8L/day  Current Nutrition:  - TPN 9/26>> - completed thiamine 100mg  daily x5 days (9/26-9/30) - 10/4: CLD  Plan:  Today, at 1800:  - Continue TPN at goal rate of 75 mL/hr - Electrolytes in TPN:  Na 150 mEq/L maxed K 50Eq/L  Ca 3 mEq/L Mg 25mEq/L Phos 10 mmol/L Cl:Ac max Cl - Add standard MVI and trace elements to TPN - continue Sensitive q4h SSI and adjust as needed  - Monitor TPN labs on Mon/Thurs and PRN - f/u diet advancement now that venting G tube placed: CLD - change IV PPI & Pepcid to po -  F/u toleration of diet  Christian Treadway S. Merilynn Finland, PharmD, BCPS Clinical Staff Pharmacist [email or web address] 08/03/2023 7:45 AM

## 2023-08-03 NOTE — Progress Notes (Signed)
Palliative Medicine Progress Note   Patient Name: Sherri Gill       Date: 08/03/2023 DOB: 1951-08-23  Age: 72 y.o. MRN#: [id number] Attending Physician: Alberteen Sam, * Primary Care Physician: [name], MD Admit Date: 06/29/2023    HPI/Patient Profile: 72 y.o. female  with past medical history of metastatic uterine cancer with peritoneal carcinomatosis on active chemotherapy/immunotherapy, paroxysmal atrial fibrillation on Xarelto, DM, and HTN who was admitted on 07/02/2023 with intractable nausea and vomiting. She was started on TPN due to refractory ileus.  Palliative Medicine was consulted for goals of care.   Subjective: Chart reviewed including labs, progress notes, and imaging. Patient remains on TPN.   Bedside visit. Patient is alert, sitting up in bed. She has no acute complaints other than pain/soreness at the g-tube insertion site.   I attempted to continue GOC discussion, but patient declines, stating "today is not a good time".    Objective:  Physical Exam Constitutional:      General: She is not in acute distress. Pulmonary:     Effort: Pulmonary effort is normal.  Neurological:     Mental Status: She is alert and oriented to person, place, and time.  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 Active Problems:   Essential hypertension   Type 2 diabetes mellitus with neurological complications (HCC)   Uterine cancer (HCC)   Hypokalemia   PAF (paroxysmal atrial fibrillation) (HCC) onset post-op Spring 2023   Malnutrition of moderate degree   Thrush   Hypothyroidism   Pancytopenia (HCC)   Obesity (BMI 30-39.9)     Recommendations/Plan: Goals of care - clinical improvement and to return home when medically stable Patient seems reluctant to engage in additional GOC discussion PMT will continue to follow  Primary Decision Maker: PATIENT   Advanced Directives: HCPOA document on file in EMR/Vynca designating her daughter/Sherri Gill as health care agent and her friend/Sherri Gill as alternate.     Code Status: Full code   Prognosis:  Unable to determine  Discharge Planning: To Be Determined    Thank you for allowing the Palliative Medicine Team to assist in the care of this patient.  MDM - low   Merry Proud, NP   Please contact Palliative Medicine Team phone at [phone] for questions and concerns.  For individual providers, please see [employer]ION.

## 2023-08-03 NOTE — Progress Notes (Signed)
Progress Note   Patient: Sherri Gill ZHY:[id number] DOB: 1950-11-16 DOA: 07/28/2023     20 DOS: the patient was seen and examined on 08/03/2023 at 11:04AM      Brief hospital course: Sherri Gill is a 72 y.o. F with stage IV uterine cancer (s/p TAH, b/l SOO) with peritoneal carcinomatosis on active chemotherapy/immunotherapy, pAF on Xarelto, DM, and HTN who was admitted with intractable nausea and vomiting.     Assessment and Plan: * Refractory ileus due to carcinomatosis Venting PEG in place Has done well so far with venting at night and clear liquids Several bowel movements yesterday -Continue vent PEG at night - Continue TPN - Advance to full liquids  Uterine cancer (HCC) -Consult oncology  Pancytopenia (HCC) Blood counts appropriately improved after transfusion - Repeat CBC tomorrow  Hypothyroidism - Continue levothyroxine    PAF (paroxysmal atrial fibrillation) (HCC) onset post-op Spring 2023 No pulmonary bleeding - Continue Xarelto, metoprolol, diltiazem   Type 2 diabetes mellitus with neurological complications (HCC) Glucose controlled - Continue SS correction insulin   Essential hypertension Blood pressure slightly high - Continue with open diltiazem          Subjective: Had some good bowel movements yesterday, no significant vomiting or indigestion.     Physical Exam: BP (!) 155/67 (BP Location: Right Arm)   Pulse 80   Temp 98.4 F (36.9 C) (Oral)   Resp 18   Ht 5\' 3"  (1.6 m)   Wt 81.1 kg   SpO2 94%   BMI 31.67 kg/m   Adult female, lying bed, interactive and appropriate RRR, no murmurs, no peripheral edema Respiratory rate normal, lungs clear without rales or wheezes Abdomen soft, some mild tenderness near her PEG site which appears clean dry and intact Attention normal, affect appropriate, judgment and insight appear normal   Data Reviewed: Comprehensive metabolic panel unremarkable  Family Communication:       Disposition: Status is: Inpatient         Author: Alberteen Sam, MD 08/03/2023 12:29 PM  For on call review [email or web address].

## 2023-08-04 DIAGNOSIS — C55 Malignant neoplasm of uterus, part unspecified: Secondary | ICD-10-CM | POA: Diagnosis not present

## 2023-08-04 DIAGNOSIS — E876 Hypokalemia: Secondary | ICD-10-CM | POA: Diagnosis not present

## 2023-08-04 DIAGNOSIS — K567 Ileus, unspecified: Secondary | ICD-10-CM | POA: Diagnosis not present

## 2023-08-04 DIAGNOSIS — I10 Essential (primary) hypertension: Secondary | ICD-10-CM | POA: Diagnosis not present

## 2023-08-04 LAB — CBC
HCT: 29.2 % — ABNORMAL LOW (ref 36.0–46.0)
Hemoglobin: 9 g/dL — ABNORMAL LOW (ref 12.0–15.0)
MCH: 28.3 pg (ref 26.0–34.0)
MCHC: 30.8 g/dL (ref 30.0–36.0)
MCV: 91.8 fL (ref 80.0–100.0)
Platelets: 116 10*3/uL — ABNORMAL LOW (ref 150–400)
RBC: 3.18 MIL/uL — ABNORMAL LOW (ref 3.87–5.11)
RDW: 15.4 % (ref 11.5–15.5)
WBC: 4.5 10*3/uL (ref 4.0–10.5)
nRBC: 0 % (ref 0.0–0.2)

## 2023-08-04 LAB — PHOSPHORUS: Phosphorus: 3.5 mg/dL (ref 2.5–4.6)

## 2023-08-04 LAB — COMPREHENSIVE METABOLIC PANEL
ALT: 39 U/L (ref 0–44)
AST: 34 U/L (ref 15–41)
Albumin: 2.3 g/dL — ABNORMAL LOW (ref 3.5–5.0)
Alkaline Phosphatase: 161 U/L — ABNORMAL HIGH (ref 38–126)
Anion gap: 10 (ref 5–15)
BUN: 24 mg/dL — ABNORMAL HIGH (ref 8–23)
CO2: 27 mmol/L (ref 22–32)
Calcium: 9.2 mg/dL (ref 8.9–10.3)
Chloride: 100 mmol/L (ref 98–111)
Creatinine, Ser: 0.76 mg/dL (ref 0.44–1.00)
GFR, Estimated: >60 mL/min (ref >60)
Glucose, Bld: 171 mg/dL — ABNORMAL HIGH (ref 70–99)
Potassium: 4.2 mmol/L (ref 3.5–5.1)
Sodium: 137 mmol/L (ref 135–145)
Total Bilirubin: 0.5 mg/dL (ref 0.3–1.2)
Total Protein: 6.1 g/dL — ABNORMAL LOW (ref 6.5–8.1)

## 2023-08-04 LAB — GLUCOSE, CAPILLARY
Glucose-Capillary: 143 mg/dL — ABNORMAL HIGH (ref 70–99)
Glucose-Capillary: 146 mg/dL — ABNORMAL HIGH (ref 70–99)
Glucose-Capillary: 147 mg/dL — ABNORMAL HIGH (ref 70–99)
Glucose-Capillary: 150 mg/dL — ABNORMAL HIGH (ref 70–99)
Glucose-Capillary: 164 mg/dL — ABNORMAL HIGH (ref 70–99)
Glucose-Capillary: 97 mg/dL (ref 70–99)

## 2023-08-04 LAB — MAGNESIUM: Magnesium: 1.5 mg/dL — ABNORMAL LOW (ref 1.7–2.4)

## 2023-08-04 LAB — TRIGLYCERIDES: Triglycerides: 47 mg/dL (ref <150)

## 2023-08-04 MED ORDER — ENSURE ENLIVE PO LIQD: 237.0000 mL | Freq: Two times a day (BID) | ORAL | Status: DC

## 2023-08-04 MED ORDER — TRAVASOL 10 % IV SOLN
INTRAVENOUS | Status: DC
  Filled 2023-08-04: qty 900

## 2023-08-04 MED ORDER — GLUCERNA SHAKE PO LIQD
237.0000 mL | Freq: Three times a day (TID) | ORAL | Status: DC
  Administered 2023-08-04 – 2023-08-05 (×3): 237 mL via ORAL
  Filled 2023-08-04 (×7): qty 237

## 2023-08-04 MED ORDER — MAGNESIUM SULFATE 2 GM/50ML IV SOLN
2.0000 g | Freq: Once | INTRAVENOUS | Status: AC
  Administered 2023-08-04: 2 g via INTRAVENOUS
  Filled 2023-08-04: qty 50

## 2023-08-04 MED ORDER — TRAVASOL 10 % IV SOLN
INTRAVENOUS | Status: AC
  Filled 2023-08-04: qty 480

## 2023-08-04 MED ORDER — MAGNESIUM SULFATE 2 GM/50ML IV SOLN
INTRAVENOUS | Status: AC
  Filled 2023-08-04: qty 50

## 2023-08-04 NOTE — Progress Notes (Addendum)
PHARMACY - TOTAL PARENTERAL NUTRITION CONSULT NOTE   Indication: Prolonged ileus  Patient Measurements: Height: 5\' 3"  (160 cm) Weight: 81.1 kg (178 lb 12.7 oz) IBW/kg (Calculated) : 52.4 TPN AdjBW (KG): 59.5 Body mass index is 31.67 kg/m.  Assessment:  [name] admitted 9/16 for nausea/vomiting x 2.5 weeks. Patient with h/o recurrent uterine cancer--Bx taken during 9/18 small bowel enteroscopy but no signs of malignant obstruction, negative for malignancy. Oncology recommended resumption of chemotherapy during this admission--carboplatin and paclitaxel completed 9/25.   Patient has severe malnutrition given inability to keep food down with continued N/V during this admission and PTA. Pharmacy consulted for TPN management.  - Carcinomatosis causing delayed gastric motility   Glucose / Insulin: Hx of T2DM - sSSI q4h (used 9 units in 24 hrs) - CBGs (goal 60-150): 130 - 171  Electrolytes: Na improves to 137 (max Na conc in TPN), CorrCa up 10.56, Mag 1.5; other lytes wnl   Renal: Scr <1; BUN elevated but down 24  Hepatic: -LFTs and Tbili wnl, Alk phos trending up 161 - Albumin low  - TG 100 (9/30), 47 (10/7)  Intake / Output; MIVF:  - I/O: - 110 mL - UOP: 3x - Last BM: 10/6  - Emesis/NG: NGT out - po intake: none recorded - G tube drain: 240 mL  - MIVF: NS at 38ml/hr  GI Meds: Compazine 5 every 6 hrs prn  refractory nausea, Pepcid 20 po/hs,  Zofran 4 every 6 as needed and pantoprazole 40 po BID   GI Imaging: - 9/20 DG UGI: Gastric fold thickening as shown on prior CT and endoscopy. Mildly dilated duodenum, with sluggish flow of contrast through the duodenum--possibility of a more distal SBO is not excluded given this appearance. - 9/29 abd Xray: Slight increase in central small bowel dilatation.   GI Surgeries / Procedures:  - 9/18: small bowel endoscopy: Normal appearing, widely patent esophagus and GEJ. Edematous gastric folds-biopsied. A single duodenal polyp in the  post-bulbar duodenum-biopsied - 10/3:  placement of gastrostomy tube today to be used for venting  Central access: port TPN start date:  9/26  Nutritional Goals: Goal TPN rate is 75 mL/hr (provides 90 g of protein and 1746 kcals per day)  RD Assessment: Estimated Needs Total Energy Estimated Needs: 1650-1850 Total Protein Estimated Needs: 85-100g Total Fluid Estimated Needs: 1.8L/day  Current Nutrition:  - TPN 9/26>> - completed thiamine 100mg  daily x5 days (9/26-9/30) - 10/4: CLD  Plan:   Now:  - Magnesium sulfate 2gm IV x1  Today, at 1800:  - Continue TPN at goal rate of 75 mL/hr - Electrolytes in TPN:  Na 150 mEq/L K 50 mEq/L  Reduce Ca 1 mEq/L Mg 25mEq/L Phos 10 mmol/L Cl:Ac max Cl - Add standard MVI and trace elements to TPN - continue Sensitive q4h SSI and adjust as needed  - Monitor TPN labs on Mon/Thurs and PRN - check bmet and mag level on 10/8 - f/u diet advancement now that venting G tube placed: CLD -  F/u toleration of diet  Dorna Leitz, PharmD, BCPS 08/04/2023 7:39 AM _____________________________________  Adden: Per Dr. Maryfrances Bunnell, wean TPN today and start calorie count - reduce TPN rate to 40 ml/hr with new bag at 6p today  Dorna Leitz, PharmD, BCPS 08/04/2023 9:01 AM

## 2023-08-04 NOTE — Progress Notes (Signed)
Sherri Gill   DOB:1950-12-15   ZO#:[id number]    ASSESSMENT & PLAN:  Recurrent uterine cancer She presents with partial small bowel obstruction She tolerated chemotherapy with carboplatin and paclitaxel 07/23/2023 In the meantime, continue supportive care   Nausea, vomiting and dehydration, resolving She had venting gastrostomy tube placement on 10/3   Acquired pancytopenia Due to chemotherapy, she had received 1 unit of blood last weekend on October 4 Monitor closely   Dehydration, mild hypokalemia and other electrolyte imbalance, severe malnutrition, improving Anticipate prolonged ileus and the patient is already malnourished She is able to tolerate some liquid diet Advance diet as tolerated and we will start weaning her off TPN Recommend calorie counts to monitor oral intake If she is able to eat more than 50% of required calories by mouth, I am optimistic she can be discharged soon   Discharge planning Unknown, will follow All questions were answered. The patient knows to call the clinic with any problems, questions or concerns.   The total time spent in the appointment was 40 minutes encounter with patients including review of chart and various tests results, discussions about plan of care and coordination of care plan  Sherri Delay, MD 08/04/2023 8:20 AM  Subjective:  She is doing well over the weekend.  She is able to tolerate oral intake without sensation of nausea or vomiting.  Continue to have passage of flatus.  Denies abdominal pain.  Objective:  Vitals:   08/03/23 2020 08/04/23 0402  BP: (!) 144/67 (!) 143/64  Pulse: 78 74  Resp: 16 18  Temp: 98.7 F (37.1 C) (!) 97.5 F (36.4 C)  SpO2: 92% 94%     Intake/Output Summary (Last 24 hours) at 08/04/2023 0820 Last data filed at 08/04/2023 0300 Gross per 24 hour  Intake 130 ml  Output 240 ml  Net -110 ml    GENERAL:alert, no distress and comfortable    Labs:  Recent Labs    08/02/23 1330 08/03/23 0725  08/04/23 0534  NA 134* 135 137  K 4.7 4.1 4.2  CL 100 96* 100  CO2 26 29 27   GLUCOSE 166* 159* 171*  BUN 24* 25* 24*  CREATININE 0.69 0.63 0.76  CALCIUM 8.7* 9.0 9.2  GFRNONAA >60 >60 >60  PROT 5.9* 6.0* 6.1*  ALBUMIN 2.5* 2.4* 2.3*  AST 22 26 34  ALT 21 26 39  ALKPHOS 128* 138* 161*  BILITOT 0.6 0.5 0.5    Studies:  IR GASTROSTOMY TUBE MOD SED  Result Date: 07/31/2023 INDICATION: 72 year old female with history of progressive gynecologic malignancy and bowel obstruction requiring percutaneous gastrostomy access for venting purposes. EXAM: PERC PLACEMENT GASTROSTOMY MEDICATIONS: Ancef 2 gm IV; Antibiotics were administered within 1 hour of the procedure. ANESTHESIA/SEDATION: Versed 2 mg IV; Fentanyl 100 mcg IV Moderate Sedation Time:  12 The patient was continuously monitored during the procedure by the interventional radiology nurse under my direct supervision. CONTRAST:  20mL OMNIPAQUE IOHEXOL 300 MG/ML SOLN - administered into the gastric lumen. FLUOROSCOPY TIME:  Ten mGy COMPLICATIONS: None immediate. PROCEDURE: Informed written consent was obtained from the patient after a thorough discussion of the procedural risks, benefits and alternatives. All questions were addressed. Maximal Sterile barrier Technique was utilized including caps, mask, sterile gowns, sterile gloves, sterile drape, hand hygiene and skin antiseptic. A timeout was performed prior to the initiation of the procedure. The patient was placed on the procedure table in the supine position. Pre-procedure abdominal film confirmed visualization of the transverse colon. The patient  was prepped and draped in usual sterile fashion. The stomach was insufflated with air via the indwelling nasogastric tube. Under fluoroscopy, a puncture site was selected and local analgesia achieved with 1% lidocaine infiltrated subcutaneously. Under fluoroscopic guidance, a gastropexy needle was passed into the stomach and the T-bar suture was  released. Entry into the stomach was confirmed with fluoroscopy, aspiration of air, and injection of contrast material. This was repeated with an additional gastropexy suture (for a total of 2 fasteners). At the center of these gastropexy sutures, a dermatotomy was performed. An 18 gauge needle was passed into the stomach at the site of this dermatotomy, and position within the gastric lumen again confirmed under fluoroscopy using aspiration of air and contrast injection. An Amplatz guidewire was passed through this needle and intraluminal placement within the stomach was confirmed by fluoroscopy. The needle was removed. Over the guidewire, the percutaneous tract was dilated using a 10 mm non-compliant balloon. The balloon was deflated, then pushed into the gastric lumen followed in concert by the 20 Fr gastrostomy tube. The retention balloon of the percutaneous gastrostomy tube was inflated with 10 mL of sterile water. The tube was withdrawn until the retention balloon was at the edge of the gastric lumen. The external bumper was brought to the abdominal wall. Contrast was injected through the gastrostomy tube, confirming intraluminal positioning. The patient tolerated the procedure well without any immediate post-procedural complications. IMPRESSION: Technically successful placement of 20 Fr gastrostomy tube. PLAN: Plan for routine 6 month gastrostomy tube exchange. Marliss Coots, MD [facility] Radiology Electronically Signed   By: Marliss Coots M.D.   On: 07/31/2023 11:26   DG Chest 2 View  Result Date: 07/30/2023 CLINICAL DATA:  Hospital-acquired pneumonia. EXAM: CHEST - 2 VIEW COMPARISON:  Chest/abdominal radiographs 07/20/2023 FINDINGS: A right jugular Port-A-Cath remains in place with tip near the superior cavoatrial junction. An enteric tube courses into the abdomen with tip not imaged and with side port near the GE junction. The cardiomediastinal  silhouette is unchanged with normal heart size. Aortic atherosclerosis is noted. The lungs are hypoinflated. No confluent airspace opacity, edema, pleural effusion, or pneumothorax is identified. No acute osseous abnormality is seen. IMPRESSION: No active cardiopulmonary disease. Electronically Signed   By: Sebastian Ache M.D.   On: 07/30/2023 13:44   DG Abd 1 View  Result Date: 07/30/2023 CLINICAL DATA:  Nasogastric tube placement EXAM: ABDOMEN - 1 VIEW COMPARISON:  07/27/2023 FINDINGS: Enteric tube with tip at the stomach which appears decompressed. No gas dilated bowel is seen, there is partial coverage of the right abdomen. Rounded densities over the left flank are within colonic diverticula based on prior CT. IMPRESSION: Enteric tube with tip at the stomach. Electronically Signed   By: Tiburcio Pea M.D.   On: 07/30/2023 04:36   DG Abd 2 Views  Result Date: 07/27/2023 CLINICAL DATA:  Abdominal pain EXAM: ABDOMEN - 2 VIEW COMPARISON:  07/22/2023 FINDINGS: Gastric catheter is again seen in the stomach. No free air is noted. Right chest wall port is seen. Contrast material is noted in multiple colonic diverticula. A few mildly prominent loops of small bowel are noted in the central abdomen. This is somewhat increased when compare with the prior CT of 07/12/2023. Bony abnormality is noted. IMPRESSION: Slight increase in central small bowel dilatation. CT may be helpful for further evaluation. Electronically Signed   By: Alcide Clever M.D.   On: 07/27/2023 20:13   DG Abd 1 View  Result Date: 07/22/2023 CLINICAL DATA:  [id number] Encounter for nasogastric (NG) tube placement [id number]. EXAM: ABDOMEN - 1 VIEW COMPARISON:  Abdominal radiograph 07/19/2023. FINDINGS: Right chest Port-A-Cath tip projects over the right atrium. Enteric tube tip and side port project over the stomach. Scant retained contrast in visualized portions of the colon. IMPRESSION: Enteric tube tip and side port project over the stomach.  Electronically Signed   By: Orvan Falconer M.D.   On: 07/22/2023 14:27   DG Abd 1 View  Result Date: 07/19/2023 CLINICAL DATA:  [id number] SBO (small bowel obstruction) (HCC) [id number] EXAM: ABDOMEN - 1 VIEW COMPARISON:  July 18, 2023 FINDINGS: Enteric contrast has progressed to the rectum. Multiple colonic diverticuli. No dilated loops of bowel are seen. IMPRESSION: Enteric contrast has progressed to the rectum. Electronically Signed   By: Meda Klinefelter M.D.   On: 07/19/2023 10:22   DG UGI W SINGLE CM (SOL OR THIN BA)  Result Date: 07/18/2023 CLINICAL DATA:  Endometrial/uterine cancer. Fold thickening in the stomach on recent endoscopy. Reflux and bloating. Difficulty keeping foods and liquids down. EXAM: UPPER GI SERIES WITH KUB TECHNIQUE: After obtaining a scout radiograph a routine upper GI series was performed using thin barium. Mobility was mildly limited on today's exam, for example the patient was unable to turn prone. FLUOROSCOPY: Radiation Exposure Index (as provided by the fluoroscopic device): 21.7 mGy Kerma COMPARISON:  CT abdomen 07/12/2023 FINDINGS: Initial KUB unremarkable aside from mild levoconvex lumbar scoliosis. The pharyngeal phase of swallowing appears normal. Primary peristaltic waves were intact on 4/4 swallows. Small type 1 hiatal hernia. Occasional gastroesophageal reflux was observed on today's exam. I used mucosal relief and balloon compression of the stomach in order to help mitigate the use of thin barium and single contrast technique as well as the patient is inability to perform prone imaging. These demonstrate a gastric fold thickening as shown on prior CT and prior endoscopy. The duodenum is mildly dilated, especially proximally. Eventually this proceeded on to the fourth portion of the duodenum as shown on image 1 series 15. However, the contrast column was sluggish to flow through the duodenum, and given the somewhat dilated appearance, the possibility of obstruction  distally is not excluded. Consider following the barium column with serial KUB use in assessing for small bowel obstruction in several hours. A 13 mm barium tablet passed briskly into the stomach. Port-A-Cath noted. IMPRESSION: 1. Gastric fold thickening as shown on prior CT and endoscopy. 2. Small type 1 hiatal hernia. 3. Intermittent gastroesophageal reflux. 4. Mildly dilated duodenum, with sluggish flow of contrast through the duodenum. The possibility of a more distal small bowel obstruction is not excluded given this appearance. Consider KUB follow-up in several hours to assess for transit of the barium column through the small bowel in assessing for small bowel obstruction. Electronically Signed   By: Gaylyn Rong M.D.   On: 07/18/2023 12:00   DG Abd Portable 1 View  Result Date: 07/04/2023 CLINICAL DATA:  Nasogastric tube placement. EXAM: PORTABLE ABDOMEN - 1 VIEW COMPARISON:  Abdomen and pelvis CT dated 07/12/2023. FINDINGS: Normal bowel-gas pattern. Interval nasogastric tube with its tip and side hole in the proximal to mid stomach. Lower thoracic spine degenerative changes. IMPRESSION: Nasogastric tube tip and side hole in the proximal to mid stomach. Electronically Signed   By: Beckie Salts M.D.   On: 07/01/2023 17:09   DG Abdomen Acute W/Chest  Result Date: 07/03/2023 CLINICAL DATA:  Vomiting EXAM: DG ABDOMEN ACUTE WITH 1 VIEW  CHEST COMPARISON:  07/10/2023. FINDINGS: There is no evidence of dilated bowel loops or free intraperitoneal air. No radiopaque calculi or other significant radiographic abnormality is seen. Heart size and mediastinal contours are within normal limits. Both lungs are clear. There are thoracic degenerative changes. Right-sided Port-A-Cath tip overlies distal SVC. IMPRESSION: Negative abdominal radiographs.  No acute cardiopulmonary disease. Electronically Signed   By: Layla Maw M.D.   On: 07/18/2023 14:40   CT ABDOMEN PELVIS W CONTRAST  Result Date:  07/12/2023 CLINICAL DATA:  Endometrial/uterine cancer.  Nausea and vomiting. * Tracking Code: BO * EXAM: CT ABDOMEN AND PELVIS WITH CONTRAST TECHNIQUE: Multidetector CT imaging of the abdomen and pelvis was performed using the standard protocol following bolus administration of intravenous contrast. RADIATION DOSE REDUCTION: This exam was performed according to the departmental dose-optimization program which includes automated exposure control, adjustment of the mA and/or kV according to patient size and/or use of iterative reconstruction technique. CONTRAST:  OMNIPAQUE IOHEXOL 300 MG/ML  SOLN COMPARISON:  CT abdomen/pelvis from 06/17/2023 FINDINGS: Lower chest: A catheter terminates in the right atrium on the top most image. Mild cardiomegaly. Hepatobiliary: Unremarkable Pancreas: Unremarkable Spleen: Unremarkable Adrenals/Urinary Tract: Unremarkable Stomach/Bowel: Diffuse gastric wall thickening with mild gastric and substantial proximal duodenal distension. After crossing the midline, the duodenum does not appear distended but there is substantial distal duodenal and jejunal wall thickening, worsened from prior exams. Scattered colonic diverticula. Appendix unremarkable. No pneumatosis. No hypoenhancement of bowel wall. There are abnormal contains small fluid collections along the small bowel mesentery, for example along the left abdomen on image 64 series 7 and along the right abdomen on image 55 series 7. In addition there is fluid density in the lesser sac on image 76 series 7. Vascular/Lymphatic: Mild atheromatous vascular disease of the aortoiliac tree. Patent celiac trunk and SMA. Patent superior mesenteric vein. Suspected left iliac adenopathy measuring about 1.3 cm in diameter on image 47 series 2, stable. This is of similar density to the adjacent vein. Reproductive: Uterus absent. Nodular enhancement along the left vaginal cuff measuring 2.5 by 1.7 cm, previously 2.3 by 1.7 cm by my measurements.  Tumor in this location is not excluded. Other: There is some abnormal stranding along the remaining omentum for example on image 73 series 9. Small nodular deposit along the upper omentum on image 31 series 2. Musculoskeletal: Lower thoracic and lumbar spondylosis. Density within the umbilicus on image 102 series 9, potentially a small knuckle of bowel versus tumor. IMPRESSION: 1. Worsening of gastric wall thickening, distal duodenal wall thickening, and jejunal wall thickening. The stomach and proximal duodenum are distended. The appearance favors gastroenteritis. 2. There is also some abnormal stranding along the remaining omentum. There are also abnormal fluid collections along the small bowel mesentery and in the lesser sac. The appearance raises concern for possible residual peritoneal tumor causing fluid deposits. Small nodular deposit along the upper omentum along with nodularity in the umbilicus. 3. Nodular enhancement along the left vaginal cuff measuring 2.5 by 1.7 cm, previously 2.3 by 1.7 cm by my measurements. This likely represents tumor. 4. Suspected left iliac adenopathy measuring about 1.3 cm in diameter, stable. 5. Mild cardiomegaly. 6. Scattered colonic diverticula. 7. Mild atheromatous vascular disease of the aortoiliac tree. 8. Lower thoracic and lumbar spondylosis. Electronically Signed   By: Gaylyn Rong M.D.   On: 07/12/2023 14:56   DG Abd 1 View  Result Date: 07/10/2023 CLINICAL DATA:  Abdominal pain, vomiting. History of uterine cancer. EXAM: ABDOMEN -  1 VIEW COMPARISON:  April 09, 2022. FINDINGS: The bowel gas pattern is normal. Surgical clip is seen over the left sacrum. No radio-opaque calculi or other significant radiographic abnormality are seen. IMPRESSION: No abnormal bowel dilatation. Electronically Signed   By: Lupita Raider M.D.   On: 07/10/2023 14:18

## 2023-08-04 NOTE — Progress Notes (Signed)
Mobility Specialist - Progress Note   08/04/23 1425  Mobility  Activity Ambulated with assistance in hallway  Level of Assistance Modified independent, requires aide device or extra time  Assistive Device Front wheel walker  Distance Ambulated (ft) 460 ft  Activity Response Tolerated well  Mobility Referral Yes  $Mobility charge 1 Mobility  Mobility Specialist Start Time (ACUTE ONLY) 0156  Mobility Specialist Stop Time (ACUTE ONLY) 0224  Mobility Specialist Time Calculation (min) (ACUTE ONLY) 28 min   Pt received in bed and agreeable to mobility. Prior to ambulating, pt requested assistance to [facility]. No complaints during session. Pt to EOB for meal after session with all needs met.   [facility]

## 2023-08-04 NOTE — Progress Notes (Signed)
Nutrition Follow-up  DOCUMENTATION CODES:   Non-severe (moderate) malnutrition in context of chronic illness  INTERVENTION:   -48 hour Calorie Count ordered per MD  - Please document % intake of all foods, drinks, and nutrition supplements patient consumes on meal tickets and place in envelope on patient's door.  - If patient skips/refuses meals please document 0% for that meal.  -TPN management per Pharmacy -Daily weights while on TPN  -Glucerna Shake po TID, each supplement provides 220 kcal and 10 grams of protein  -Ordered ice cream BID with meals, pt not able to have Magic cups as she does not eat pork products for religious reasons.  NUTRITION DIAGNOSIS:   Moderate Malnutrition related to chronic illness, cancer and cancer related treatments as evidenced by mild muscle depletion, energy intake < or equal to 75% for > or equal to 1 month.  Ongoing  GOAL:   Patient will meet greater than or equal to 90% of their needs  Meeting with TPN currently.  MONITOR:   PO intake, Supplement acceptance, Labs, Weight trends, I & O's (TPN)  REASON FOR ASSESSMENT:   Consult Calorie Count  ASSESSMENT:   72 y.o. 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  Spoke with patient and visitor at bedside about Calorie count starting today. Pt reports having no appetite, states she was hungry this past Saturday.  She mainly consumed clear liquids this morning, hot tea-sweetened, apple and cranberry juice (~225 kcals). Discussed higher calorie and higher protein options on full liquids. She is open to drinking protein shakes and likes Glucerna as she has drank this in the past. Have ordered TID. She  was open to Magic cups but they contain pork in the gelatin so plain ice cream is acceptable.  TPN at goal 75 at bedside, plan is for TPN to decrease to 40 tonight. Providing ~931 kcals and 48g protein.  Admission weight: 178 lbs Current weight: 178 lbs  Medications: Pepcid, Miralax, Senokot, IV Mg sulfate  Labs reviewed: CBGs: 130-157 Low Mg TG 47  Diet Order:   Diet Order             Diet full liquid Room service appropriate? Yes; Fluid consistency: Thin  Diet effective now                   EDUCATION NEEDS:   No education needs have been identified at this time  Skin:  Skin Assessment: Reviewed RN Assessment  Last BM:  10/6  Height:   Ht Readings from Last 1 Encounters:  07/16/23 5\' 3"  (1.6 m)    Weight:   Wt Readings from Last 1 Encounters:  08/03/23 81.1 kg    BMI:  Body mass index is 31.67 kg/m.  Estimated Nutritional Needs:   Kcal:  1650-1850  Protein:  85-100g  Fluid:  1.8L/day   Tilda Franco, MS, RD, LDN Inpatient Clinical Dietitian Contact information available via Amion

## 2023-08-04 NOTE — Progress Notes (Signed)
Progress Note   Patient: Sherri Gill BMW:[id number] DOB: December 18, 1950 DOA: August 04, 2023     21 DOS: the patient was seen and examined on 08/04/2023        Brief hospital course: Sherri Gill is a 72 y.o. F with stage IV uterine cancer (s/p TAH, b/l SOO) with peritoneal carcinomatosis on active chemotherapy/immunotherapy, pAF on Xarelto, DM, and HTN who was admitted with intractable nausea and vomiting.     Assessment and Plan: * Refractory ileus due to carcinomatosis S/p venting PEG on 10/3.  Feels better with NG out.   Did well with full liquids yesterday - Taper TPN to 50% - Start calorie count  Uterine cancer (HCC) - Consult Oncology, appreciate cares - Chemo per Oncology  Obesity (BMI 30-39.9) BMI 31  Pancytopenia (HCC) Hemoglobin stable  Hypothyroidism - Continue levothyroxine    PAF (paroxysmal atrial fibrillation) (HCC) onset post-op Spring 2023 -Continue Xarelto, metoprolol, diltiazem  Type 2 diabetes mellitus with neurological complications (HCC) Glucose controlled - Continue SS correction insulin   Essential hypertension -Continue metoprolol and diltiazem          Subjective: Patient is doing okay.  She is having bowel movements, she did well with full liquids.     Physical Exam: BP 131/67 (BP Location: Left Arm)   Pulse 78   Temp 97.9 F (36.6 C) (Oral)   Resp 20   Ht 5\' 3"  (1.6 m)   Wt 81.1 kg   SpO2 100%   BMI 31.67 kg/m   Adult female, lying in bed, interactive and appropriate RRR, no murmurs, no peripheral edema Respiratory rate normal, lungs clear without rales or wheezes Bowel sounds good Attention normal, affect pleasant, judgment and insight appear normal    Data Reviewed: Magnesium 1.5 Comprehensive metabolic panel unremarkable   Family Communication: None    Disposition: Status is: Inpatient         Author: Alberteen Sam, MD 08/04/2023 5:50 PM  For on call review [email or web address].

## 2023-08-05 DIAGNOSIS — I10 Essential (primary) hypertension: Secondary | ICD-10-CM | POA: Diagnosis not present

## 2023-08-05 DIAGNOSIS — K311 Adult hypertrophic pyloric stenosis: Secondary | ICD-10-CM | POA: Diagnosis not present

## 2023-08-05 DIAGNOSIS — C55 Malignant neoplasm of uterus, part unspecified: Secondary | ICD-10-CM | POA: Diagnosis not present

## 2023-08-05 DIAGNOSIS — I480 Paroxysmal atrial fibrillation: Secondary | ICD-10-CM | POA: Diagnosis not present

## 2023-08-05 LAB — COMPREHENSIVE METABOLIC PANEL
ALT: 34 U/L (ref 0–44)
AST: 27 U/L (ref 15–41)
Albumin: 2.2 g/dL — ABNORMAL LOW (ref 3.5–5.0)
Alkaline Phosphatase: 172 U/L — ABNORMAL HIGH (ref 38–126)
Anion gap: 11 (ref 5–15)
BUN: 20 mg/dL (ref 8–23)
CO2: 25 mmol/L (ref 22–32)
Calcium: 9.3 mg/dL (ref 8.9–10.3)
Chloride: 99 mmol/L (ref 98–111)
Creatinine, Ser: 0.72 mg/dL (ref 0.44–1.00)
GFR, Estimated: >60 mL/min (ref >60)
Glucose, Bld: 132 mg/dL — ABNORMAL HIGH (ref 70–99)
Potassium: 4.1 mmol/L (ref 3.5–5.1)
Sodium: 135 mmol/L (ref 135–145)
Total Bilirubin: 0.4 mg/dL (ref 0.3–1.2)
Total Protein: 6.3 g/dL — ABNORMAL LOW (ref 6.5–8.1)

## 2023-08-05 LAB — GLUCOSE, CAPILLARY
Glucose-Capillary: 117 mg/dL — ABNORMAL HIGH (ref 70–99)
Glucose-Capillary: 126 mg/dL — ABNORMAL HIGH (ref 70–99)
Glucose-Capillary: 130 mg/dL — ABNORMAL HIGH (ref 70–99)
Glucose-Capillary: 142 mg/dL — ABNORMAL HIGH (ref 70–99)
Glucose-Capillary: 152 mg/dL — ABNORMAL HIGH (ref 70–99)
Glucose-Capillary: 167 mg/dL — ABNORMAL HIGH (ref 70–99)

## 2023-08-05 LAB — MAGNESIUM: Magnesium: 1.6 mg/dL — ABNORMAL LOW (ref 1.7–2.4)

## 2023-08-05 MED ORDER — TRAVASOL 10 % IV SOLN
INTRAVENOUS | Status: AC
  Filled 2023-08-05: qty 480

## 2023-08-05 MED ORDER — MAGNESIUM SULFATE 2 GM/50ML IV SOLN
2.0000 g | Freq: Once | INTRAVENOUS | Status: AC
  Administered 2023-08-05: 2 g via INTRAVENOUS
  Filled 2023-08-05: qty 50

## 2023-08-05 NOTE — Progress Notes (Signed)
Calorie Count Note  48 hour calorie count ordered.  Diet: Full liquids Supplements:  -Glucerna Shake po TID, each supplement provides 220 kcal and 10 grams of protein   10/7: Breakfast: 225 kcals Lunch: 70 kcals Dinner: 100 kcals Supplements: none  Total intake: 395 kcal (23% of minimum estimated needs)  0g protein (0% of minimum estimated needs)  Patient reports feeling nauseous and has not felt good. Has not wanted to drink Glucerna given nausea.  Nutrition Dx: Moderate Malnutrition related to chronic illness, cancer and cancer related treatments as evidenced by mild muscle depletion, energy intake < or equal to 75% for > or equal to 1 month.   Goal: Pt to meet >/= 90% of their estimated nutrition needs   Intervention:  -Calorie Count continues  -TPN management per Pharmacy -Daily weights while on TPN -Glucerna Shake po TID -Encourage PO intakes  Tilda Franco, MS, RD, LDN Inpatient Clinical Dietitian Contact information available via Amion

## 2023-08-05 NOTE — Progress Notes (Signed)
PHARMACY - TOTAL PARENTERAL NUTRITION CONSULT NOTE   Indication: Prolonged ileus  Patient Measurements: Height: 5\' 3"  (160 cm) Weight: 81.1 kg (178 lb 12.7 oz) IBW/kg (Calculated) : 52.4 TPN AdjBW (KG): 59.5 Body mass index is 31.67 kg/m.  Assessment:  [name] admitted 9/16 for nausea/vomiting x 2.5 weeks. Patient with h/o recurrent uterine cancer--Bx taken during 9/18 small bowel enteroscopy but no signs of malignant obstruction, negative for malignancy. Oncology recommended resumption of chemotherapy during this admission--carboplatin and paclitaxel completed 9/25.   Patient has severe malnutrition given inability to keep food down with continued N/V during this admission and PTA. Pharmacy consulted for TPN management.  - Carcinomatosis causing delayed gastric motility   Glucose / Insulin: Hx of T2DM - sSSI q4h (used 6 units in 24 hrs) - CBGs (goal 60-150): 97-150  Electrolytes: Na 135 (max Na conc in TPN), CorrCa up 10.74 (despite Ca conc decreased in TPN yesterday), Mag 1.6; other lytes wnl   Renal: Scr <1; BUN elevated but down 24  Hepatic: -LFTs and Tbili wnl, Alk phos trending up 172 - Albumin low  - TG 100 (9/30), 47 (10/7)  Intake / Output; MIVF:  - I/O: + 2406 mL - UOP: not documented - Last BM: 10/6  - po intake: 480 mL  - G tube drain: 250 mL  - MIVF: NS at 64ml/hr  GI Meds: Compazine 5 every 6 hrs prn  refractory nausea, Pepcid 20 po/hs,  Zofran 4 every 6 as needed and pantoprazole 40 po BID   GI Imaging: - 9/20 DG UGI: Gastric fold thickening as shown on prior CT and endoscopy. Mildly dilated duodenum, with sluggish flow of contrast through the duodenum--possibility of a more distal SBO is not excluded given this appearance. - 9/29 abd Xray: Slight increase in central small bowel dilatation.   GI Surgeries / Procedures:  - 9/18: small bowel endoscopy: Normal appearing, widely patent esophagus and GEJ. Edematous gastric folds-biopsied. A single duodenal  polyp in the post-bulbar duodenum-biopsied - 10/3:  placement of gastrostomy tube today to be used for venting  Central access: port TPN start date:  9/26  Nutritional Goals: Goal TPN rate is 75 mL/hr (provides 90 g of protein and 1746 kcals per day)  RD Assessment: Estimated Needs Total Energy Estimated Needs: 1650-1850 Total Protein Estimated Needs: 85-100g Total Fluid Estimated Needs: 1.8L/day  Current Nutrition:  - TPN 9/26>> - completed thiamine 100mg  daily x5 days (9/26-9/30) - 10/4: CLD - 10/7: calorie count started, Glucerna 237 mL TID ordered  - 10/8: pt has n/v wit poor oral intake. Per Dr. Maryfrances Bunnell, cont TPN for now  Plan:   Now:  - Magnesium sulfate 2gm IV x1  Today, at 1800:  - Continue TPN at half rate of 40 mL/hr - Electrolytes in TPN:  Na 150 mEq/L K 50 mEq/L  Remove Ca from TPN Mg 45mEq/L Phos 10 mmol/L Cl:Ac max Cl - Add standard MVI and trace elements to TPN - continue Sensitive q4h SSI and adjust as needed  - Monitor TPN labs on Mon/Thurs and PRN - bmet and mag on 10/9 - f/u oral intake  Dorna Leitz, PharmD, BCPS 08/05/2023 7:12 AM

## 2023-08-05 NOTE — Progress Notes (Signed)
Progress Note   Patient: Sherri Gill [id number]:[id number] DOB: 1951/01/15 DOA: 07/26/2023     22 DOS: the patient was seen and examined on 08/05/2023 at 11:55AM      Brief hospital course: Sherri Gill is a 72 y.o. F with stage IV uterine cancer (s/p TAH, b/l SOO) with peritoneal carcinomatosis on active chemotherapy/immunotherapy, pAF on Xarelto, DM, and HTN who was admitted with intractable nausea and vomiting.     Assessment and Plan: * Refractory ileus due to carcinomatosis Admitted 9/16, NG placed in the ER.  Multiple attempts to clamp/remove NG over the subsequent weeks were unsuccessful and eventually venting PEG was recommended.  S/p venting PEG on 10/3.  Since venting PEG placement, patient did well with venting from 9pm to 7am daily.  She tolerated clears for several days.  In the last two nights, there have been problems with venting her PEG and so today she is more nauseated and bloated.  This resolved this afternoon when we found the correct equipment and vented her PEG.  - Continue PEG open to gravity overnight, clamped during the day - Continue full liquids   - TPN has been reduced today to 1/2 rate - Continue calorie count - If her Calorie count in the next 48-72 hours improves to close to goal, stop TPN and plan for d/c home   Uterine cancer [facility]) Peritoneal implants leading to ileus.    - Consult Oncology, appreciate cares - Chemo per Oncology  Obesity (BMI 30-39.9) BMI 31  Pancytopenia (HCC) Due to chemo.  Transfused on 10/4 and Hgb stable since.    Hypothyroidism - Continue levothyroxine    Thrush Resolved  Malnutrition of moderate degree Severe malnutrition ruled out.  Related to cancer and cancer related treatments as evidenced by mild muscle depletion, energy intake less than 75% for more than 1 month.  - Consult dietitian  PAF (paroxysmal atrial fibrillation) (HCC) onset post-op Spring 2023 - Continue Xarelto - Continue metoprolol and  diltiazem    Hypokalemia Supplemented  Type 2 diabetes mellitus with neurological complications (HCC) Glucose controlled - Continue SS correction insulin   Essential hypertension AKI ruled out.  BP okay. - Continue metoprolol and diltiazem           Subjective: Patient more distended and nauseated this morning, but that the gastrostomy and she felt much better.  No fever, no confusion     Physical Exam: BP 139/72 (BP Location: Left Arm)   Pulse 97   Temp 98.4 F (36.9 C) (Oral)   Resp 18   Ht 5\' 3"  (1.6 m)   Wt 81.1 kg   SpO2 100%   BMI 31.67 kg/m   Adult female, lying in bed, interactive and appropriate Tachycardic, regular, no murmurs, no peripheral edema Respiratory normal, lungs clear without rales or wheezes overall diminished Abdomen soft no tenderness palpation or guarding, no ascites or distention Attention normal, affect normal, judgment insight appear normal, mild generalized weakness but symmetric   Data Reviewed: Comprehensive metabolic panel unremarkable, mild hypomagnesemia  Family Communication: None present    Disposition: Status is: Inpatient Patient was admitted with ileus  Ileus became intractable and she had a venting gastrostomy placed  Our hope is that over the coming days, we can demonstrate that she has adequate oral intake.  Daylight hours, intermediate calorie nutrition needs and we can discharge home after stopping TPN with nightly venting         Author: Alberteen Sam, MD 08/05/2023 8:49 PM  For  on call review [email or web address].

## 2023-08-05 NOTE — Plan of Care (Signed)
  Problem: Education: Goal: Knowledge of General Education information will improve Description: Including pain rating scale, medication(s)/side effects and non-pharmacologic comfort measures Outcome: Progressing   Problem: Health Behavior/Discharge Planning: Goal: Ability to manage health-related needs will improve Outcome: Progressing   Problem: Clinical Measurements: Goal: Ability to maintain clinical measurements within normal limits will improve Outcome: Progressing Goal: Will remain free from infection Outcome: Progressing Goal: Diagnostic test results will improve Outcome: Progressing Goal: Cardiovascular complication will be avoided Outcome: Progressing   Problem: Activity: Goal: Risk for activity intolerance will decrease Outcome: Progressing   Problem: Nutrition: Goal: Adequate nutrition will be maintained Outcome: Progressing   Problem: Coping: Goal: Level of anxiety will decrease Outcome: Progressing   Problem: Elimination: Goal: Will not experience complications related to bowel motility Outcome: Progressing Goal: Will not experience complications related to urinary retention Outcome: Progressing   Problem: Pain Managment: Goal: General experience of comfort will improve Outcome: Progressing   Problem: Safety: Goal: Ability to remain free from injury will improve Outcome: Progressing   Problem: Skin Integrity: Goal: Risk for impaired skin integrity will decrease Outcome: Progressing   Problem: Education: Goal: Ability to describe self-care measures that may prevent or decrease complications (Diabetes Survival Skills Education) will improve Outcome: Progressing Goal: Individualized Educational Video(s) Outcome: Progressing   Problem: Coping: Goal: Ability to adjust to condition or change in health will improve Outcome: Progressing   Problem: Fluid Volume: Goal: Ability to maintain a balanced intake and output will improve Outcome: Progressing    Problem: Health Behavior/Discharge Planning: Goal: Ability to identify and utilize available resources and services will improve Outcome: Progressing Goal: Ability to manage health-related needs will improve Outcome: Progressing   Problem: Metabolic: Goal: Ability to maintain appropriate glucose levels will improve Outcome: Progressing   Problem: Nutritional: Goal: Maintenance of adequate nutrition will improve Outcome: Progressing Goal: Progress toward achieving an optimal weight will improve Outcome: Progressing   Problem: Skin Integrity: Goal: Risk for impaired skin integrity will decrease Outcome: Progressing   Problem: Tissue Perfusion: Goal: Adequacy of tissue perfusion will improve Outcome: Progressing   

## 2023-08-06 ENCOUNTER — Telehealth: Payer: Self-pay

## 2023-08-06 DIAGNOSIS — Z515 Encounter for palliative care: Secondary | ICD-10-CM | POA: Diagnosis not present

## 2023-08-06 DIAGNOSIS — Z7189 Other specified counseling: Secondary | ICD-10-CM | POA: Diagnosis not present

## 2023-08-06 DIAGNOSIS — C55 Malignant neoplasm of uterus, part unspecified: Secondary | ICD-10-CM | POA: Diagnosis not present

## 2023-08-06 DIAGNOSIS — K567 Ileus, unspecified: Secondary | ICD-10-CM | POA: Diagnosis not present

## 2023-08-06 LAB — CBC
HCT: 32.5 % — ABNORMAL LOW (ref 36.0–46.0)
Hemoglobin: 10.2 g/dL — ABNORMAL LOW (ref 12.0–15.0)
MCH: 28.3 pg (ref 26.0–34.0)
MCHC: 31.4 g/dL (ref 30.0–36.0)
MCV: 90.3 fL (ref 80.0–100.0)
Platelets: 154 10*3/uL (ref 150–400)
RBC: 3.6 MIL/uL — ABNORMAL LOW (ref 3.87–5.11)
RDW: 15.4 % (ref 11.5–15.5)
WBC: 8.6 10*3/uL (ref 4.0–10.5)
nRBC: 0 % (ref 0.0–0.2)

## 2023-08-06 LAB — COMPREHENSIVE METABOLIC PANEL
ALT: 50 U/L — ABNORMAL HIGH (ref 0–44)
AST: 41 U/L (ref 15–41)
Albumin: 2.5 g/dL — ABNORMAL LOW (ref 3.5–5.0)
Alkaline Phosphatase: 220 U/L — ABNORMAL HIGH (ref 38–126)
Anion gap: 9 (ref 5–15)
BUN: 18 mg/dL (ref 8–23)
CO2: 21 mmol/L — ABNORMAL LOW (ref 22–32)
Calcium: 9.3 mg/dL (ref 8.9–10.3)
Chloride: 105 mmol/L (ref 98–111)
Creatinine, Ser: 0.67 mg/dL (ref 0.44–1.00)
GFR, Estimated: >60 mL/min (ref >60)
Glucose, Bld: 148 mg/dL — ABNORMAL HIGH (ref 70–99)
Potassium: 3.8 mmol/L (ref 3.5–5.1)
Sodium: 135 mmol/L (ref 135–145)
Total Bilirubin: 0.6 mg/dL (ref 0.3–1.2)
Total Protein: 7 g/dL (ref 6.5–8.1)

## 2023-08-06 LAB — GLUCOSE, CAPILLARY
Glucose-Capillary: 117 mg/dL — ABNORMAL HIGH (ref 70–99)
Glucose-Capillary: 131 mg/dL — ABNORMAL HIGH (ref 70–99)
Glucose-Capillary: 147 mg/dL — ABNORMAL HIGH (ref 70–99)
Glucose-Capillary: 152 mg/dL — ABNORMAL HIGH (ref 70–99)
Glucose-Capillary: 160 mg/dL — ABNORMAL HIGH (ref 70–99)

## 2023-08-06 LAB — MAGNESIUM: Magnesium: 1.8 mg/dL (ref 1.7–2.4)

## 2023-08-06 MED ORDER — CLINIMIX E/DEXTROSE (8/10) 8 % IV SOLN
INTRAVENOUS | Status: AC
  Filled 2023-08-06: qty 1000

## 2023-08-06 MED ORDER — ADULT MULTIVITAMIN LIQUID CH
15.0000 mL | Freq: Every day | ORAL | Status: DC
  Administered 2023-08-07 – 2023-08-10 (×3): 15 mL via ORAL
  Filled 2023-08-06 (×4): qty 15

## 2023-08-06 MED ORDER — CLINIMIX E/DEXTROSE (8/10) 8 % IV SOLN
INTRAVENOUS | Status: DC
  Filled 2023-08-06: qty 960

## 2023-08-06 MED ORDER — KATE FARMS STANDARD 1.4 PO LIQD
325.0000 mL | Freq: Two times a day (BID) | ORAL | Status: DC
  Administered 2023-08-06 – 2023-08-07 (×2): 325 mL via ORAL
  Filled 2023-08-06 (×3): qty 325

## 2023-08-06 NOTE — Progress Notes (Signed)
   08/06/23 1640  Spiritual Encounters  Type of Visit Attempt (pt unavailable)  Care provided to: Pt not available  OnCall Visit No   Chaplain attempted a visit and the patient was working with the mobility specialist.    Arlyce Dice, Chaplain Resident

## 2023-08-06 NOTE — Progress Notes (Signed)
PROGRESS NOTE    Sherri Gill  KVQ:[id number] DOB: Sep 30, 1951 DOA: 07/05/2023 PCP: [name], MD    Brief Narrative:  72 year old female with a stage IV uterine cancer status post TAH and LSO with peritoneal carcinomatosis currently on active chemotherapy remains in the hospital due to persistent nausea and vomiting, intolerance to oral intake.  Patient does have history of persistent A-fib on Xarelto, type 2 diabetes, hypertension.  Complicated hospital course with refractory ileus due to carcinomatosis.   Assessment & Plan:   Persistent refractory ileus due to carcinomatosis  Admitted 9/16, NG placed in the ER.  Multiple attempts to clamp/remove NG over the subsequent weeks were unsuccessful and eventually venting PEG was recommended. S/p venting PEG on 10/3.  Since venting PEG placement, patient did well with venting from 9pm to 7am daily.  She tolerated clears for several days.  - Continue PEG open to gravity overnight, clamped during the day - Continue full liquids   - TPN has been reduced today to 1/2 rate - Continue calorie count - Patient does not have adequate intake, she will likely continue to need TPN.  There is Sport and exercise psychologist of TPN, will request to continue as much available.  Uterine cancer with peritoneal implants leading to ileus: This is a stage IV uterine cancer. Patient currently receiving chemotherapy.  Patient may need more education about her cancer.  Pancytopenia, due to chemo.  Blood transfusion on 10/4.  Stable since then. Hypothyroidism, on thyroxine. Moderate malnutrition: On TPN Paroxysmal A-fib: On Xarelto.  Sinus rhythm on metoprolol and Cardizem.  Blood pressure stable.  DVT prophylaxis: SCDs Start: 07/13/2023 2156 rivaroxaban (XARELTO) tablet 20 mg   Code Status: Full code Family Communication: Sister at the bedside Disposition Plan: Status is: Inpatient Remains inpatient appropriate because: On TPN unable to eat      Consultants:  Oncology  Procedures:  Multiple procedures as above  Antimicrobials:  None   Subjective: Patient seen and examined.  Her sister was at the bedside.  Patient told me she has mild discomfort in her abdomen.  Patient told me that she tried to drink plenty of Ensure and that caused her diarrhea.  Noted 1 loose stool since last 24 hours.  Denies any nausea vomiting but she did became uncomfortable while attempting to eat more last night.  Patient is here for more than 3 weeks.  Patient asked me why she is having intestinal blockage.  I explained to her about peritoneal carcinomatosis that caused ileus.  Also explained to her that carcinomatosis also causes nausea and poor appetite.  Patient started crying and explained to me that she never heard that her cancer is stage IV or outside the uterus.  Supportive listening, encouragement done.  Patient was agreeable to continue to discuss her care with oncology.  Also asked her to continue to discuss care with palliative care provider.  Patient was satisfied with answers.  Objective: Vitals:   08/05/23 2002 08/06/23 0429 08/06/23 1114 08/06/23 1331  BP: 139/72 (!) 145/70 138/72 (!) 153/73  Pulse: 97 83  94  Resp: 18 18  16   Temp: 98.4 F (36.9 C) (!) 97.3 F (36.3 C)  98.1 F (36.7 C)  TempSrc: Oral Oral  Oral  SpO2: 100% 97%  100%  Weight:      Height:        Intake/Output Summary (Last 24 hours) at 08/06/2023 1534 Last data filed at 08/06/2023 0500 Gross per 24 hour  Intake 456.46 ml  Output 1200  ml  Net -743.54 ml   Filed Weights   07/29/23 0521 08/01/23 1701 08/03/23 0500  Weight: 79.6 kg 79.7 kg 81.1 kg    Examination:  General exam: Slightly anxious but looks comfortable.  On room air. Respiratory system: No added sounds. Cardiovascular system: S1 & S2 heard, RRR. No pedal edema. Gastrointestinal system: Abdomen is nondistended, soft and mild tenderness along the epigastrium.  Bowel sound present.  PEG  tube is clean and dry.   Central nervous system: Alert and oriented. No focal neurological deficits. Extremities: Symmetric 5 x 5 power. Skin: No rashes, lesions or ulcers    Data Reviewed: I have personally reviewed following labs and imaging studies  CBC: Recent Labs  Lab 07/31/23 0557 08/01/23 0542 08/02/23 1330 08/04/23 0534 08/06/23 0559  WBC 3.0* 2.2* 2.8* 4.5 8.6  HGB 7.9* 7.6* 9.4* 9.0* 10.2*  HCT 26.3* 24.8* 29.4* 29.2* 32.5*  MCV 92.0 91.2 88.6 91.8 90.3  PLT 133* 129* 116* 116* 154   Basic Metabolic Panel: Recent Labs  Lab 07/31/23 0557 08/01/23 0542 08/02/23 1330 08/03/23 0725 08/04/23 0534 08/05/23 0545 08/06/23 0559  NA 138 137 134* 135 137 135 135  K 4.3 4.0 4.7 4.1 4.2 4.1 3.8  CL 106 105 100 96* 100 99 105  CO2 25 27 26 29 27 25  21*  GLUCOSE 168* 167* 166* 159* 171* 132* 148*  BUN 28* 27* 24* 25* 24* 20 18  CREATININE 0.68 0.66 0.69 0.63 0.76 0.72 0.67  CALCIUM 9.0 8.7* 8.7* 9.0 9.2 9.3 9.3  MG 1.6* 1.8  --   --  1.5* 1.6* 1.8  PHOS 3.3 3.5  --   --  3.5  --   --    GFR: Estimated Creatinine Clearance: 66 mL/min (by C-G formula based on SCr of 0.67 mg/dL). Liver Function Tests: Recent Labs  Lab 08/02/23 1330 08/03/23 0725 08/04/23 0534 08/05/23 0545 08/06/23 0559  AST 22 26 34 27 41  ALT 21 26 39 34 50*  ALKPHOS 128* 138* 161* 172* 220*  BILITOT 0.6 0.5 0.5 0.4 0.6  PROT 5.9* 6.0* 6.1* 6.3* 7.0  ALBUMIN 2.5* 2.4* 2.3* 2.2* 2.5*   No results for input(s): "LIPASE", "AMYLASE" in the last 168 hours. No results for input(s): "AMMONIA" in the last 168 hours. Coagulation Profile: Recent Labs  Lab 07/31/23 1136  INR 1.1   Cardiac Enzymes: No results for input(s): "CKTOTAL", "CKMB", "CKMBINDEX", "TROPONINI" in the last 168 hours. BNP (last 3 results) No results for input(s): "PROBNP" in the last 8760 hours. HbA1C: No results for input(s): "HGBA1C" in the last 72 hours. CBG: Recent Labs  Lab 08/05/23 2005 08/06/23 0033  08/06/23 0431 08/06/23 0725 08/06/23 1210  GLUCAP 167* 147* 131* 117* 152*   Lipid Profile: Recent Labs    08/04/23 0534  TRIG 47   Thyroid Function Tests: No results for input(s): "TSH", "T4TOTAL", "FREET4", "T3FREE", "THYROIDAB" in the last 72 hours. Anemia Panel: No results for input(s): "VITAMINB12", "FOLATE", "FERRITIN", "TIBC", "IRON", "RETICCTPCT" in the last 72 hours. Sepsis Labs: No results for input(s): "PROCALCITON", "LATICACIDVEN" in the last 168 hours.  No results found for this or any previous visit (from the past 240 hour(s)).       Radiology Studies: No results found.      Scheduled Meds:  Chlorhexidine Gluconate Cloth  6 each Topical Daily   diltiazem  120 mg Oral Daily   dorzolamide-timolol  1 drop Both Eyes BID   famotidine  20 mg Oral QHS  feeding supplement (K[employer] STANDARD 1.4)  325 mL Oral BID BM   insulin aspart  0-9 Units Subcutaneous Q4H   latanoprost  1 drop Both Eyes QHS   levothyroxine  50 mcg Oral Q0600   metoprolol succinate  12.5 mg Oral Daily   [START ON 08/07/2023] multivitamin  15 mL Oral Daily   pantoprazole  40 mg Oral BID AC   polyethylene glycol  17 g Oral Daily   rivaroxaban  20 mg Oral Q supper   senna  1 tablet Oral Daily   sodium chloride flush  10-40 mL Intracatheter Q12H   Continuous Infusions:  .TPN (CLINIMIX-E) Adult     TPN ADULT (ION) 40 mL/hr at 08/05/23 1735     LOS: 23 days    Time spent: 35 minutes    Dorcas Carrow, MD [facility]

## 2023-08-06 NOTE — Progress Notes (Signed)
Sherri Gill   DOB:1950/12/19   ZO#:[id number]    ASSESSMENT & PLAN:  Recurrent uterine cancer She presents with partial small bowel obstruction She tolerated chemotherapy with carboplatin and paclitaxel 07/23/2023 In the meantime, continue supportive care   Nausea, vomiting and dehydration, resolving She had venting gastrostomy tube placement on 10/3 She had another episode of vomiting yesterday I prefer not to put her gastrostomy tube on drainage Continue close observation If the patient experience another episode of recurrent nausea vomiting again, we will put her on on n.p.o.   Acquired pancytopenia Due to chemotherapy, she had received 1 unit of blood last weekend on October 4 Monitor closely   Dehydration, mild hypokalemia and other electrolyte imbalance, severe malnutrition, improving Anticipate prolonged ileus and the patient is already malnourished She is able to tolerate some liquid diet Recommend calorie counts to monitor oral intake If she is able to eat more than 50% of required calories by mouth, I am optimistic she can be discharged soon   Discharge planning Unknown, will follow  All questions were answered. The patient knows to call the clinic with any problems, questions or concerns.   The total time spent in the appointment was 40 minutes encounter with patients including review of chart and various tests results, discussions about plan of care and coordination of care plan  Artis Delay, MD 08/06/2023 9:16 AM  Subjective:  She had 1 episode of vomiting last evening.  She did not eat her dinner.  The patient did not understand the rationale behind advancing her diet.  She thought that her G-tube is supposed to be placed on suction every night.  She denies abdominal pain.  She had intermittent regular bowel movement  Objective:  Vitals:   08/05/23 2002 08/06/23 0429  BP: 139/72 (!) 145/70  Pulse: 97 83  Resp: 18 18  Temp: 98.4 F (36.9 C) (!) 97.3 F (36.3  C)  SpO2: 100% 97%     Intake/Output Summary (Last 24 hours) at 08/06/2023 0916 Last data filed at 08/06/2023 0500 Gross per 24 hour  Intake 936.46 ml  Output 2250 ml  Net -1313.54 ml    GENERAL:alert, no distress and comfortable  Labs:  Recent Labs    08/04/23 0534 08/05/23 0545 08/06/23 0559  NA 137 135 135  K 4.2 4.1 3.8  CL 100 99 105  CO2 27 25 21*  GLUCOSE 171* 132* 148*  BUN 24* 20 18  CREATININE 0.76 0.72 0.67  CALCIUM 9.2 9.3 9.3  GFRNONAA >60 >60 >60  PROT 6.1* 6.3* 7.0  ALBUMIN 2.3* 2.2* 2.5*  AST 34 27 41  ALT 39 34 50*  ALKPHOS 161* 172* 220*  BILITOT 0.5 0.4 0.6    Studies:  IR GASTROSTOMY TUBE MOD SED  Result Date: 07/31/2023 INDICATION: 72 year old female with history of progressive gynecologic malignancy and bowel obstruction requiring percutaneous gastrostomy access for venting purposes. EXAM: PERC PLACEMENT GASTROSTOMY MEDICATIONS: Ancef 2 gm IV; Antibiotics were administered within 1 hour of the procedure. ANESTHESIA/SEDATION: Versed 2 mg IV; Fentanyl 100 mcg IV Moderate Sedation Time:  12 The patient was continuously monitored during the procedure by the interventional radiology nurse under my direct supervision. CONTRAST:  20mL OMNIPAQUE IOHEXOL 300 MG/ML SOLN - administered into the gastric lumen. FLUOROSCOPY TIME:  Ten mGy COMPLICATIONS: None immediate. PROCEDURE: Informed written consent was obtained from the patient after a thorough discussion of the procedural risks, benefits and alternatives. All questions were addressed. Maximal Sterile barrier Technique was utilized including caps,  mask, sterile gowns, sterile gloves, sterile drape, hand hygiene and skin antiseptic. A timeout was performed prior to the initiation of the procedure. The patient was placed on the procedure table in the supine position. Pre-procedure abdominal film confirmed visualization of the transverse colon. The patient was prepped and draped in usual sterile fashion. The stomach  was insufflated with air via the indwelling nasogastric tube. Under fluoroscopy, a puncture site was selected and local analgesia achieved with 1% lidocaine infiltrated subcutaneously. Under fluoroscopic guidance, a gastropexy needle was passed into the stomach and the T-bar suture was released. Entry into the stomach was confirmed with fluoroscopy, aspiration of air, and injection of contrast material. This was repeated with an additional gastropexy suture (for a total of 2 fasteners). At the center of these gastropexy sutures, a dermatotomy was performed. An 18 gauge needle was passed into the stomach at the site of this dermatotomy, and position within the gastric lumen again confirmed under fluoroscopy using aspiration of air and contrast injection. An Amplatz guidewire was passed through this needle and intraluminal placement within the stomach was confirmed by fluoroscopy. The needle was removed. Over the guidewire, the percutaneous tract was dilated using a 10 mm non-compliant balloon. The balloon was deflated, then pushed into the gastric lumen followed in concert by the 20 Fr gastrostomy tube. The retention balloon of the percutaneous gastrostomy tube was inflated with 10 mL of sterile water. The tube was withdrawn until the retention balloon was at the edge of the gastric lumen. The external bumper was brought to the abdominal wall. Contrast was injected through the gastrostomy tube, confirming intraluminal positioning. The patient tolerated the procedure well without any immediate post-procedural complications. IMPRESSION: Technically successful placement of 20 Fr gastrostomy tube. PLAN: Plan for routine 6 month gastrostomy tube exchange. Marliss Coots, MD [facility] Radiology Electronically Signed   By: Marliss Coots M.D.   On: 07/31/2023 11:26   DG Chest 2 View  Result Date: 07/30/2023 CLINICAL DATA:  Hospital-acquired pneumonia. EXAM: CHEST - 2 VIEW  COMPARISON:  Chest/abdominal radiographs 07/08/2023 FINDINGS: A right jugular Port-A-Cath remains in place with tip near the superior cavoatrial junction. An enteric tube courses into the abdomen with tip not imaged and with side port near the GE junction. The cardiomediastinal silhouette is unchanged with normal heart size. Aortic atherosclerosis is noted. The lungs are hypoinflated. No confluent airspace opacity, edema, pleural effusion, or pneumothorax is identified. No acute osseous abnormality is seen. IMPRESSION: No active cardiopulmonary disease. Electronically Signed   By: Sebastian Ache M.D.   On: 07/30/2023 13:44   DG Abd 1 View  Result Date: 07/30/2023 CLINICAL DATA:  Nasogastric tube placement EXAM: ABDOMEN - 1 VIEW COMPARISON:  07/27/2023 FINDINGS: Enteric tube with tip at the stomach which appears decompressed. No gas dilated bowel is seen, there is partial coverage of the right abdomen. Rounded densities over the left flank are within colonic diverticula based on prior CT. IMPRESSION: Enteric tube with tip at the stomach. Electronically Signed   By: Tiburcio Pea M.D.   On: 07/30/2023 04:36   DG Abd 2 Views  Result Date: 07/27/2023 CLINICAL DATA:  Abdominal pain EXAM: ABDOMEN - 2 VIEW COMPARISON:  07/22/2023 FINDINGS: Gastric catheter is again seen in the stomach. No free air is noted. Right chest wall port is seen. Contrast material is noted in multiple colonic diverticula. A few mildly prominent loops of small bowel are noted in the central abdomen. This is somewhat increased when compare  with the prior CT of 07/12/2023. Bony abnormality is noted. IMPRESSION: Slight increase in central small bowel dilatation. CT may be helpful for further evaluation. Electronically Signed   By: Alcide Clever M.D.   On: 07/27/2023 20:13   DG Abd 1 View  Result Date: 07/22/2023 CLINICAL DATA:  [id number] Encounter for nasogastric (NG) tube placement [id number]. EXAM: ABDOMEN - 1 VIEW COMPARISON:  Abdominal  radiograph 07/19/2023. FINDINGS: Right chest Port-A-Cath tip projects over the right atrium. Enteric tube tip and side port project over the stomach. Scant retained contrast in visualized portions of the colon. IMPRESSION: Enteric tube tip and side port project over the stomach. Electronically Signed   By: Orvan Falconer M.D.   On: 07/22/2023 14:27   DG Abd 1 View  Result Date: 07/19/2023 CLINICAL DATA:  [id number] SBO (small bowel obstruction) (HCC) [id number] EXAM: ABDOMEN - 1 VIEW COMPARISON:  July 18, 2023 FINDINGS: Enteric contrast has progressed to the rectum. Multiple colonic diverticuli. No dilated loops of bowel are seen. IMPRESSION: Enteric contrast has progressed to the rectum. Electronically Signed   By: Meda Klinefelter M.D.   On: 07/19/2023 10:22   DG UGI W SINGLE CM (SOL OR THIN BA)  Result Date: 07/18/2023 CLINICAL DATA:  Endometrial/uterine cancer. Fold thickening in the stomach on recent endoscopy. Reflux and bloating. Difficulty keeping foods and liquids down. EXAM: UPPER GI SERIES WITH KUB TECHNIQUE: After obtaining a scout radiograph a routine upper GI series was performed using thin barium. Mobility was mildly limited on today's exam, for example the patient was unable to turn prone. FLUOROSCOPY: Radiation Exposure Index (as provided by the fluoroscopic device): 21.7 mGy Kerma COMPARISON:  CT abdomen 07/12/2023 FINDINGS: Initial KUB unremarkable aside from mild levoconvex lumbar scoliosis. The pharyngeal phase of swallowing appears normal. Primary peristaltic waves were intact on 4/4 swallows. Small type 1 hiatal hernia. Occasional gastroesophageal reflux was observed on today's exam. I used mucosal relief and balloon compression of the stomach in order to help mitigate the use of thin barium and single contrast technique as well as the patient is inability to perform prone imaging. These demonstrate a gastric fold thickening as shown on prior CT and prior endoscopy. The duodenum is  mildly dilated, especially proximally. Eventually this proceeded on to the fourth portion of the duodenum as shown on image 1 series 15. However, the contrast column was sluggish to flow through the duodenum, and given the somewhat dilated appearance, the possibility of obstruction distally is not excluded. Consider following the barium column with serial KUB use in assessing for small bowel obstruction in several hours. A 13 mm barium tablet passed briskly into the stomach. Port-A-Cath noted. IMPRESSION: 1. Gastric fold thickening as shown on prior CT and endoscopy. 2. Small type 1 hiatal hernia. 3. Intermittent gastroesophageal reflux. 4. Mildly dilated duodenum, with sluggish flow of contrast through the duodenum. The possibility of a more distal small bowel obstruction is not excluded given this appearance. Consider KUB follow-up in several hours to assess for transit of the barium column through the small bowel in assessing for small bowel obstruction. Electronically Signed   By: Gaylyn Rong M.D.   On: 07/18/2023 12:00   DG Abd Portable 1 View  Result Date: 07/15/2023 CLINICAL DATA:  Nasogastric tube placement. EXAM: PORTABLE ABDOMEN - 1 VIEW COMPARISON:  Abdomen and pelvis CT dated 07/12/2023. FINDINGS: Normal bowel-gas pattern. Interval nasogastric tube with its tip and side hole in the proximal to mid stomach. Lower thoracic spine degenerative changes. IMPRESSION: Nasogastric  tube tip and side hole in the proximal to mid stomach. Electronically Signed   By: Beckie Salts M.D.   On: August 13, 2023 17:09   DG Abdomen Acute W/Chest  Result Date: 08/13/2023 CLINICAL DATA:  Vomiting EXAM: DG ABDOMEN ACUTE WITH 1 VIEW CHEST COMPARISON:  07/10/2023. FINDINGS: There is no evidence of dilated bowel loops or free intraperitoneal air. No radiopaque calculi or other significant radiographic abnormality is seen. Heart size and mediastinal contours are within normal limits. Both lungs are clear. There are  thoracic degenerative changes. Right-sided Port-A-Cath tip overlies distal SVC. IMPRESSION: Negative abdominal radiographs.  No acute cardiopulmonary disease. Electronically Signed   By: Layla Maw M.D.   On: 08/13/2023 14:40   CT ABDOMEN PELVIS W CONTRAST  Result Date: 07/12/2023 CLINICAL DATA:  Endometrial/uterine cancer.  Nausea and vomiting. * Tracking Code: BO * EXAM: CT ABDOMEN AND PELVIS WITH CONTRAST TECHNIQUE: Multidetector CT imaging of the abdomen and pelvis was performed using the standard protocol following bolus administration of intravenous contrast. RADIATION DOSE REDUCTION: This exam was performed according to the departmental dose-optimization program which includes automated exposure control, adjustment of the mA and/or kV according to patient size and/or use of iterative reconstruction technique. CONTRAST:  OMNIPAQUE IOHEXOL 300 MG/ML  SOLN COMPARISON:  CT abdomen/pelvis from 06/17/2023 FINDINGS: Lower chest: A catheter terminates in the right atrium on the top most image. Mild cardiomegaly. Hepatobiliary: Unremarkable Pancreas: Unremarkable Spleen: Unremarkable Adrenals/Urinary Tract: Unremarkable Stomach/Bowel: Diffuse gastric wall thickening with mild gastric and substantial proximal duodenal distension. After crossing the midline, the duodenum does not appear distended but there is substantial distal duodenal and jejunal wall thickening, worsened from prior exams. Scattered colonic diverticula. Appendix unremarkable. No pneumatosis. No hypoenhancement of bowel wall. There are abnormal contains small fluid collections along the small bowel mesentery, for example along the left abdomen on image 64 series 7 and along the right abdomen on image 55 series 7. In addition there is fluid density in the lesser sac on image 76 series 7. Vascular/Lymphatic: Mild atheromatous vascular disease of the aortoiliac tree. Patent celiac trunk and SMA. Patent superior mesenteric vein. Suspected  left iliac adenopathy measuring about 1.3 cm in diameter on image 47 series 2, stable. This is of similar density to the adjacent vein. Reproductive: Uterus absent. Nodular enhancement along the left vaginal cuff measuring 2.5 by 1.7 cm, previously 2.3 by 1.7 cm by my measurements. Tumor in this location is not excluded. Other: There is some abnormal stranding along the remaining omentum for example on image 73 series 9. Small nodular deposit along the upper omentum on image 31 series 2. Musculoskeletal: Lower thoracic and lumbar spondylosis. Density within the umbilicus on image 102 series 9, potentially a small knuckle of bowel versus tumor. IMPRESSION: 1. Worsening of gastric wall thickening, distal duodenal wall thickening, and jejunal wall thickening. The stomach and proximal duodenum are distended. The appearance favors gastroenteritis. 2. There is also some abnormal stranding along the remaining omentum. There are also abnormal fluid collections along the small bowel mesentery and in the lesser sac. The appearance raises concern for possible residual peritoneal tumor causing fluid deposits. Small nodular deposit along the upper omentum along with nodularity in the umbilicus. 3. Nodular enhancement along the left vaginal cuff measuring 2.5 by 1.7 cm, previously 2.3 by 1.7 cm by my measurements. This likely represents tumor. 4. Suspected left iliac adenopathy measuring about 1.3 cm in diameter, stable. 5. Mild cardiomegaly. 6. Scattered colonic diverticula. 7. Mild atheromatous vascular disease of  the aortoiliac tree. 8. Lower thoracic and lumbar spondylosis. Electronically Signed   By: Gaylyn Rong M.D.   On: 07/12/2023 14:56   DG Abd 1 View  Result Date: 07/10/2023 CLINICAL DATA:  Abdominal pain, vomiting. History of uterine cancer. EXAM: ABDOMEN - 1 VIEW COMPARISON:  April 09, 2022. FINDINGS: The bowel gas pattern is normal. Surgical clip is seen over the left sacrum. No radio-opaque calculi or  other significant radiographic abnormality are seen. IMPRESSION: No abnormal bowel dilatation. Electronically Signed   By: Lupita Raider M.D.   On: 07/10/2023 14:18

## 2023-08-06 NOTE — Progress Notes (Signed)
Nutrition Follow-up  DOCUMENTATION CODES:   Non-severe (moderate) malnutrition in context of chronic illness  INTERVENTION:   -48 hour Calorie Count ordered per MD -continued 10/9             - Please document % intake of all foods, drinks, and nutrition supplements patient consumes on meal tickets and place in envelope on patient's door.             - If patient skips/refuses meals please document 0% for that meal.   -TPN management per Pharmacy -will start Clinimix 8/10 tonight given IV infusion shortage -Daily weights while on TPN   -D/c Glucerna Shake per patient request -[employer] 1.4 PO BID, each provides 455 kcals and 20g protein -plant based option -Took ice cream off trays as pt didn't like  NUTRITION DIAGNOSIS:   Moderate Malnutrition related to chronic illness, cancer and cancer related treatments as evidenced by mild muscle depletion, energy intake < or equal to 75% for > or equal to 1 month.  Ongoing  GOAL:   Patient will meet greater than or equal to 90% of their needs  Not meeting.  MONITOR:   PO intake, Supplement acceptance, Labs, Weight trends, I & O's (TPN)  REASON FOR ASSESSMENT:   Consult Calorie Count  ASSESSMENT:   72 y.o. 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  9/16: admitted, NPO, NGT placed 9/18: NGT removed, CLD ->Regular diet ->CLD 9/19: Soft diet ->FLD 9/23: Soft diet 9/24: NPO, NGT replaced 9/26: clamping trials 9/29: clamping trails -> failed, NGT back to suction 9/30: clamping trails resumed 10/2: vomited; NGT back to suction  Due to Baxter IV fluid disruption, TPN will be provided via Clinimix E 8/10. Will continue at 40 ml/hr,  providing ~633 kcals (38% of kcal needs) and 76g protein (89% of protein needs). Pt will receive ~300 kcals less than on compounded TPN but will receive more protein.   Calorie Count 10/8: Breakfast: 185 kcals Lunch: 255  kcals Dinner: 25 kcals, 3g protein Supplements: 1 Glucerna -220 kcals, 10g protein   Total intake: 685 kcal (41% of minimum estimated needs)  13g protein (15% of minimum estimated needs)   Patient reports she wants a different supplement, she drank a Glucerna yesterday and vomited it back up. Reports she doesn't do well with dairy (even though in previous conversations she said she tolerates dairy). Pt is open to trying [employer] plant based protein shakes. If unable to tolerate this supplement, likely won't tolerate many other protein oral options.   Admission weight: 178 lbs Last weight 10/6: 178 lbs  Medications: Pepcid, Miralax, Senokot,    Labs reviewed: CBGs: 117-167    Diet Order:   Diet Order             Diet full liquid Room service appropriate? Yes; Fluid consistency: Thin  Diet effective now                   EDUCATION NEEDS:   No education needs have been identified at this time  Skin:  Skin Assessment: Reviewed RN Assessment  Last BM:  10/9 -type 7  Height:   Ht Readings from Last 1 Encounters:  07/16/23 5\' 3"  (1.6 m)    Weight:  Wt Readings from Last 1 Encounters:  08/03/23 81.1 kg    BMI:  Body mass index is 31.67 kg/m.  Estimated Nutritional Needs:   Kcal:  1650-1850  Protein:  85-100g  Fluid:  1.8L/day   Tilda Franco, MS, RD, LDN Inpatient Clinical Dietitian Contact information available via Amion

## 2023-08-06 NOTE — Telephone Encounter (Signed)
She called back. She is concerned after a physician came in this am and told her that she has stage 4 cancer. Reassured her that Dr. [name] would come see her at the hospital to talk about her cancer stage. Forwarded message to Dr. [name].

## 2023-08-06 NOTE — Progress Notes (Signed)
PHARMACY - TOTAL PARENTERAL NUTRITION CONSULT NOTE   Indication: Prolonged ileus  Patient Measurements: Height: 5\' 3"  (160 cm) Weight: 81.1 kg (178 lb 12.7 oz) IBW/kg (Calculated) : 52.4 TPN AdjBW (KG): 59.5 Body mass index is 31.67 kg/m.  Assessment:  [name] admitted 9/16 for nausea/vomiting x 2.5 weeks. Patient with h/o recurrent uterine cancer--Bx taken during 9/18 small bowel enteroscopy but no signs of malignant obstruction, negative for malignancy. Oncology recommended resumption of chemotherapy during this admission--carboplatin and paclitaxel completed 9/25.   Patient has severe malnutrition given inability to keep food down with continued N/V during this admission and PTA. Pharmacy consulted for TPN management.  - Carcinomatosis causing delayed gastric motility   Glucose / Insulin: Hx of T2DM -BG goal < 180. Range: 117 - 167 (6 units SSI/24 hrs) Electrolytes: CorrCa (10.5) slightly elevated. All other lytes WNL Renal: SCR, BUN WNL Hepatic: Alk Phos continues to trend up. ALT slightly elevated - Albumin low  - TG, Tbili WNL Intake / Output; MIVF: Strict I/O not measured -UOP: 5x unmeasured, NG/emesis: 500 mL, venting G tube: 2150 mL. LBM 10/6 -PO intake: 480 mL -no mIVF  GI Imaging: - 9/20 DG UGI: Gastric fold thickening as shown on prior CT and endoscopy. Mildly dilated duodenum, with sluggish flow of contrast through the duodenum--possibility of a more distal SBO is not excluded given this appearance. - 9/29 abd Xray: Slight increase in central small bowel dilatation.   GI Surgeries / Procedures:  - 9/18: small bowel endoscopy: Normal appearing, widely patent esophagus and GEJ. Edematous gastric folds-biopsied. A single duodenal polyp in the post-bulbar duodenum-biopsied - 10/3:  placement of gastrostomy tube today to be used for venting  Central access: port TPN start date:  9/26  Nutritional Goals:   RD Assessment: Estimated Needs Total Energy Estimated  Needs: 1650-1850 Total Protein Estimated Needs: 85-100g Total Fluid Estimated Needs: 1.8L/day  Current Nutrition:  - TPN 9/26>> - 10/4: CLD - 10/7: calorie count started, Glucerna 237 mL TID ordered  - 10/8: pt has n/v wit poor oral intake. Per Dr. Maryfrances Bunnell, cont TPN for now  Plan:  Due to the Baxter IV fluid disruption, we will be switching all adult compounded parenteral nutrition to premade Clinimix products +/- fat emulsion infusion. Our goal will be to continue providing as close to 100% of our patient's nutritional needs. Due to the volatile availability of different Clinimix concentrations we may not be able to meet 100% of adult patient nutritional goals at this time.    Today, at 1800:  Clinimix E 8/10 @ 40 mL/hr. No lipids at this time. TPN already reduced to half of goal on 10/7 Pt taking PO medications, MVI transitioned to PO Continue sSSI q4h and adjust as needed TPN labs Mon, Thurs. Check additional labs PRN. Follow for ability to wean off of TPN once tolerating PO intake  Cindi Carbon, PharmD 08/06/23 11:58 AM

## 2023-08-06 NOTE — Plan of Care (Signed)
  Problem: Education: Goal: Knowledge of General Education information will improve Description: Including pain rating scale, medication(s)/side effects and non-pharmacologic comfort measures Outcome: Progressing   Problem: Health Behavior/Discharge Planning: Goal: Ability to manage health-related needs will improve Outcome: Progressing   Problem: Clinical Measurements: Goal: Ability to maintain clinical measurements within normal limits will improve Outcome: Progressing Goal: Will remain free from infection Outcome: Progressing Goal: Diagnostic test results will improve Outcome: Progressing Goal: Cardiovascular complication will be avoided Outcome: Progressing   Problem: Activity: Goal: Risk for activity intolerance will decrease Outcome: Progressing   Problem: Nutrition: Goal: Adequate nutrition will be maintained Outcome: Progressing   Problem: Coping: Goal: Level of anxiety will decrease Outcome: Progressing   Problem: Elimination: Goal: Will not experience complications related to bowel motility Outcome: Progressing Goal: Will not experience complications related to urinary retention Outcome: Progressing   Problem: Pain Managment: Goal: General experience of comfort will improve Outcome: Progressing   Problem: Safety: Goal: Ability to remain free from injury will improve Outcome: Progressing   Problem: Skin Integrity: Goal: Risk for impaired skin integrity will decrease Outcome: Progressing   Problem: Education: Goal: Ability to describe self-care measures that may prevent or decrease complications (Diabetes Survival Skills Education) will improve Outcome: Progressing Goal: Individualized Educational Video(s) Outcome: Progressing   Problem: Coping: Goal: Ability to adjust to condition or change in health will improve Outcome: Progressing   Problem: Fluid Volume: Goal: Ability to maintain a balanced intake and output will improve Outcome: Progressing    Problem: Health Behavior/Discharge Planning: Goal: Ability to identify and utilize available resources and services will improve Outcome: Progressing Goal: Ability to manage health-related needs will improve Outcome: Progressing   Problem: Metabolic: Goal: Ability to maintain appropriate glucose levels will improve Outcome: Progressing   Problem: Nutritional: Goal: Maintenance of adequate nutrition will improve Outcome: Progressing Goal: Progress toward achieving an optimal weight will improve Outcome: Progressing   Problem: Skin Integrity: Goal: Risk for impaired skin integrity will decrease Outcome: Progressing   Problem: Tissue Perfusion: Goal: Adequacy of tissue perfusion will improve Outcome: Progressing   

## 2023-08-06 NOTE — Progress Notes (Signed)
Palliative:  HPI: 72 y.o. female  with past medical history of metastatic uterine cancer with peritoneal carcinomatosis on active chemotherapy/immunotherapy, paroxysmal atrial fibrillation on Xarelto, DM, and HTN who was admitted on 07/14/2023 with intractable nausea and vomiting. She was started on TPN due to refractory ileus. Palliative Medicine was consulted for goals of care.   I met today with Sherri Gill. Sherri Gill is an extremely pleasant lady with unfortunate illness. She asked to speak with palliative care and asks about how palliative became involved. We discussed how inpatient palliative team was invited by hospital doctor to visit and speak with her. We discussed at length palliative care and what this entails and means with open discussion about her disease process and expectations, symptom management, and optimizing quality of life. She asks about outpatient palliative care and she would benefit from follow up with my colleague Willette Alma NP at [facility] - she agrees with referral at discharge.   We spent time discussing her cancer and how this has contributed and complicated her GI issues. She reports ongoing nausea. She has GI cramping during my visit and I assisted with heating pad which provided relief. She was able to get her aunt Sherri Gill over speaker phone to discuss as well. She plans to likely stay with Sherri Gill at time of discharge. She expresses sadness at knowledge that her cancer is advanced but then stops and tells me that this makes sense in what she has been experiencing. With this knowledge she seems very interested in having discussions and making plans. Most importantly she has a disabled adult son that she needs to make sure arrangements for his care are clear. I will try and help her to have this documented.   She does report that her daughter, Sherri Gill, is her HCPOA and will care for her son after she is gone. Sherri Gill lives in [place] but will be travelling from [place] to  come visit this Friday. We discussed plans to sit down and have a conversation together over the weekend and Sherri Gill feels this will be helpful. She is open to further conversation and I will visit with her again tomorrow. She has already contacted Dr. Maxine Glenn office as she wishes to follow up and discuss with Dr. [name]. I have also consulted chaplain services for ongoing follow up per patient request.   Exam: Alert, oriented. No distress. Generalized fatigue and weakness. Abd soft with cramping, tender. + Nausea without emesis.   Plan: - Ongoing GOC conversations.  - Anticipate family meeting over the weekend with daughter. If discharged prior to meeting follow up with palliative NP at [facility] for conversation is recommended.  - Needs assistance to document her wishes and creat a Will - specifically her plans for care for her adult disabled son.  - Further conversation with Dr. [name].   60 min  Yong Channel, NP Palliative Medicine Team Pager [phone] (Please see [email or web address] for schedule) Team Phone [phone]    Greater than 50%  of this time was spent counseling and coordinating care related to the above assessment and plan

## 2023-08-06 NOTE — Progress Notes (Signed)
Mobility Specialist - Progress Note   08/06/23 1630  Mobility  Activity Ambulated with assistance in hallway  Level of Assistance Modified independent, requires aide device or extra time  Assistive Device Front wheel walker  Distance Ambulated (ft) 480 ft  Activity Response Tolerated well  Mobility Referral Yes  $Mobility charge 1 Mobility  Mobility Specialist Start Time (ACUTE ONLY) 0411  Mobility Specialist Stop Time (ACUTE ONLY) 0429  Mobility Specialist Time Calculation (min) (ACUTE ONLY) 18 min   Pt received in bed and agreeable to mobility. C/o pain near incision. RN made aware. No other complaints during session. Pt to bed after session with all needs met.     [facility]

## 2023-08-06 NOTE — Telephone Encounter (Signed)
Attempted to call her back. No answer and no voicemail.

## 2023-08-06 NOTE — Plan of Care (Signed)
Pt continues to c/o nausea throughout shift, no vomiting noted or reported by patient. Pt refused Glucerna this shift and oral pepcid. Pt was educated. Peg was to gravity from 2100-0500 with adequate drainage, no leaking noted and peg site. Call light within pt's reach.    Problem: Education: Goal: Knowledge of General Education information will improve Description: Including pain rating scale, medication(s)/side effects and non-pharmacologic comfort measures Outcome: Progressing   Problem: Health Behavior/Discharge Planning: Goal: Ability to manage health-related needs will improve Outcome: Progressing   Problem: Skin Integrity: Goal: Risk for impaired skin integrity will decrease Outcome: Progressing   Problem: Skin Integrity: Goal: Risk for impaired skin integrity will decrease Outcome: Progressing

## 2023-08-07 DIAGNOSIS — K567 Ileus, unspecified: Secondary | ICD-10-CM | POA: Diagnosis not present

## 2023-08-07 LAB — COMPREHENSIVE METABOLIC PANEL
ALT: 56 U/L — ABNORMAL HIGH (ref 0–44)
AST: 35 U/L (ref 15–41)
Albumin: 2.7 g/dL — ABNORMAL LOW (ref 3.5–5.0)
Alkaline Phosphatase: 239 U/L — ABNORMAL HIGH (ref 38–126)
Anion gap: 10 (ref 5–15)
BUN: 27 mg/dL — ABNORMAL HIGH (ref 8–23)
CO2: 19 mmol/L — ABNORMAL LOW (ref 22–32)
Calcium: 9.3 mg/dL (ref 8.9–10.3)
Chloride: 104 mmol/L (ref 98–111)
Creatinine, Ser: 0.72 mg/dL (ref 0.44–1.00)
GFR, Estimated: >60 mL/min (ref >60)
Glucose, Bld: 138 mg/dL — ABNORMAL HIGH (ref 70–99)
Potassium: 4 mmol/L (ref 3.5–5.1)
Sodium: 133 mmol/L — ABNORMAL LOW (ref 135–145)
Total Bilirubin: 0.4 mg/dL (ref 0.3–1.2)
Total Protein: 7 g/dL (ref 6.5–8.1)

## 2023-08-07 LAB — GLUCOSE, CAPILLARY
Glucose-Capillary: 120 mg/dL — ABNORMAL HIGH (ref 70–99)
Glucose-Capillary: 130 mg/dL — ABNORMAL HIGH (ref 70–99)
Glucose-Capillary: 140 mg/dL — ABNORMAL HIGH (ref 70–99)
Glucose-Capillary: 147 mg/dL — ABNORMAL HIGH (ref 70–99)
Glucose-Capillary: 156 mg/dL — ABNORMAL HIGH (ref 70–99)
Glucose-Capillary: 92 mg/dL (ref 70–99)

## 2023-08-07 LAB — MAGNESIUM: Magnesium: 1.6 mg/dL — ABNORMAL LOW (ref 1.7–2.4)

## 2023-08-07 LAB — PHOSPHORUS: Phosphorus: 3.5 mg/dL (ref 2.5–4.6)

## 2023-08-07 MED ORDER — BOOST / RESOURCE BREEZE PO LIQD CUSTOM
1.0000 | Freq: Two times a day (BID) | ORAL | Status: DC
  Administered 2023-08-07 – 2023-08-08 (×3): 1 via ORAL

## 2023-08-07 MED ORDER — CLINIMIX E/DEXTROSE (8/10) 8 % IV SOLN
INTRAVENOUS | Status: AC
  Filled 2023-08-07: qty 1000

## 2023-08-07 NOTE — Progress Notes (Addendum)
Nutrition Follow-up  DOCUMENTATION CODES:   Non-severe (moderate) malnutrition in context of chronic illness  INTERVENTION:   -Continue TPN as long as it is within GOC -Encourage PO as tolerated -Daily weights while on TPN  -Starting Boost Breeze po TID, each supplement provides 250 kcal and 9 grams of protein -Pt has not tolerated any other thick protein supplement   NUTRITION DIAGNOSIS:   Moderate Malnutrition related to chronic illness, cancer and cancer related treatments as evidenced by mild muscle depletion, energy intake < or equal to 75% for > or equal to 1 month.  Ongoing.  GOAL:   Patient will meet greater than or equal to 90% of their needs  Unable to meet kcal needs currently. TPN providing ~89% of protein needs.  MONITOR:   PO intake, Supplement acceptance, Labs, Weight trends, I & O's (TPN)   ASSESSMENT:   72 y.o. 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  9/16: admitted, NPO, NGT placed 9/18: NGT removed, CLD ->Regular diet ->CLD 9/19: Soft diet ->FLD 9/23: Soft diet 9/24: NPO, NGT replaced 9/26: clamping trials 9/29: clamping trails -> failed, NGT back to suction 9/30: clamping trails resumed 10/2: vomited; NGT back to suction 10/3: venting G-tube placed 10/4: NGT removed  Due to Baxter IV fluid disruption, TPN will be provided via Clinimix E 8/10. Will continue at 40 ml/hr, providing ~633 kcals (38% of kcal needs) and 76g protein (89% of protein needs).   Continued Calorie count for 10/9 but pt reports she was vomiting most of her liquids up yesterday. Had some hot tea and lemonade which came up. Said she tried the [employer] but that came back up too.  Will trial Boost Breeze and if unable to tolerate, will likely not tolerate any of our protein options. Will monitor GOC and will need to continue TPN.  Admission weight: 178 lbs Last weight 10/6: 178 lbs  Medications: MVI, Compazine, Pepcid  Labs reviewed:  CBGs: 117-160  Diet Order:   Diet Order             Diet full liquid Room service appropriate? Yes; Fluid consistency: Thin  Diet effective now                   EDUCATION NEEDS:   No education needs have been identified at this time  Skin:  Skin Assessment: Reviewed RN Assessment  Last BM:  10/9 -type 6  Height:   Ht Readings from Last 1 Encounters:  07/16/23 5\' 3"  (1.6 m)    Weight:   Wt Readings from Last 1 Encounters:  08/03/23 81.1 kg    BMI:  Body mass index is 31.67 kg/m.  Estimated Nutritional Needs:   Kcal:  1650-1850  Protein:  85-100g  Fluid:  1.8L/day   Tilda Franco, MS, RD, LDN Inpatient Clinical Dietitian Contact information available via Amion

## 2023-08-07 NOTE — Progress Notes (Signed)
PHARMACY - TOTAL PARENTERAL NUTRITION CONSULT NOTE   Indication: Prolonged ileus  Patient Measurements: Height: 5\' 3"  (160 cm) Weight: 81.1 kg (178 lb 12.7 oz) IBW/kg (Calculated) : 52.4 TPN AdjBW (KG): 59.5 Body mass index is 31.67 kg/m.  Assessment:  [name] admitted 9/16 for nausea/vomiting x 2.5 weeks. Patient with h/o recurrent uterine cancer--Bx taken during 9/18 small bowel enteroscopy but no signs of malignant obstruction, negative for malignancy. Oncology recommended resumption of chemotherapy during this admission--carboplatin and paclitaxel completed 9/25.   Patient has severe malnutrition given inability to keep food down with continued N/V during this admission and PTA. Pharmacy consulted for TPN management.  - Carcinomatosis causing delayed gastric motility   Glucose / Insulin: Hx of T2DM -BG goal < 180. Range: 117 - 160 (6 units SSI/24 hrs) Electrolytes: CorrCa (10.3) at upper end of normal limits, Na 133, mag 1.6 Renal: SCR, BUN WNL Hepatic: Alk Phos continues to trend up. ALT slightly elevated - Albumin low  - TG, Tbili WNL Intake / Output; MIVF: Strict I/O not measured -UOP: 2x unmeasured, no documentation other other output  per charting last 24hr, 2x unmeasured stool -PO intake: 480 mL -no mIVF  GI Imaging: - 9/20 DG UGI: Gastric fold thickening as shown on prior CT and endoscopy. Mildly dilated duodenum, with sluggish flow of contrast through the duodenum--possibility of a more distal SBO is not excluded given this appearance. - 9/29 abd Xray: Slight increase in central small bowel dilatation.   GI Surgeries / Procedures:  - 9/18: small bowel endoscopy: Normal appearing, widely patent esophagus and GEJ. Edematous gastric folds-biopsied. A single duodenal polyp in the post-bulbar duodenum-biopsied - 10/3:  placement of gastrostomy tube today to be used for venting  Central access: port TPN start date:  9/26  Nutritional Goals:  Current TPN (Clinimix  E 8/10) providing 77g protein and 634 kcal daily  RD Assessment: Estimated Needs Total Energy Estimated Needs: 1650-1850 Total Protein Estimated Needs: 85-100g Total Fluid Estimated Needs: 1.8L/day  Current Nutrition:  - TPN 9/26>> - 10/4: CLD - 10/7: calorie count started, Glucerna 237 mL TID ordered  - 10/8: pt has n/v wit poor oral intake. Per Dr. Maryfrances Bunnell, cont TPN for now - 10/10: TPN continues at half rate, [employer] feeding supplement started per RD and on FL diet  Plan:  Due to the Baxter IV fluid disruption, we will be switching all adult compounded parenteral nutrition to premade Clinimix products +/- fat emulsion infusion. Our goal will be to continue providing as close to 100% of our patient's nutritional needs. Due to the volatile availability of different Clinimix concentrations we may not be able to meet 100% of adult patient nutritional goals at this time.   This AM: Mag Sulfate 2g IV x 1  Today, at 1800:  Continue Clinimix E 8/10 @ 40 mL/hr. No lipids at this time. TPN already reduced to half of goal on 10/7 Pt taking PO medications, MVI transitioned to PO Continue sSSI but will change to q6h TPN labs Mon, Thurs. Check additional labs PRN. Follow for ability to wean off of TPN once tolerating PO intake   Hessie Knows, PharmD, BCPS Secure Chat if ?s 08/07/2023 9:56 AM

## 2023-08-07 NOTE — Progress Notes (Signed)
Mobility Specialist - Progress Note   08/07/23 1132  Mobility  Activity Ambulated with assistance in hallway  Level of Assistance Modified independent, requires aide device or extra time  Assistive Device Other (Comment) (IV Pole)  Distance Ambulated (ft) 480 ft  Activity Response Tolerated well  Mobility Referral Yes  $Mobility charge 1 Mobility  Mobility Specialist Start Time (ACUTE ONLY) 1111  Mobility Specialist Stop Time (ACUTE ONLY) 1131  Mobility Specialist Time Calculation (min) (ACUTE ONLY) 20 min   Pt received EOB and agreeable to mobility. No complaints during session. Pt to recliner after session with all needs met.    [facility]

## 2023-08-07 NOTE — Plan of Care (Signed)
  Problem: Education: Goal: Knowledge of General Education information will improve Description: Including pain rating scale, medication(s)/side effects and non-pharmacologic comfort measures Outcome: Progressing   Problem: Activity: Goal: Risk for activity intolerance will decrease Outcome: Progressing   Problem: Elimination: Goal: Will not experience complications related to urinary retention Outcome: Progressing   Problem: Safety: Goal: Ability to remain free from injury will improve Outcome: Progressing   Problem: Skin Integrity: Goal: Risk for impaired skin integrity will decrease Outcome: Progressing   Problem: Tissue Perfusion: Goal: Adequacy of tissue perfusion will improve Outcome: Progressing

## 2023-08-07 NOTE — Progress Notes (Signed)
PROGRESS NOTE    Sherri Gill  FIE:[id number] DOB: [birth year] DOA: 07/20/2023 PCP: [name], MD    Brief Narrative:  72 year old female with a stage IV uterine cancer status post TAH and LSO with peritoneal carcinomatosis currently on active chemotherapy remains in the hospital due to persistent nausea and vomiting, intolerance to oral intake.  Patient does have history of persistent A-fib on Xarelto, type 2 diabetes, hypertension.  Complicated hospital course with refractory ileus due to carcinomatosis.   Assessment & Plan:   Persistent refractory ileus due to carcinomatosis  Admitted 9/16, NG placed in the ER.  Multiple attempts to clamp/remove NG over the subsequent weeks were unsuccessful and eventually venting PEG was recommended. S/p venting PEG on 10/3.  Since venting PEG placement, patient did well with venting from 9pm to 7am daily.  She tolerated clears for several days.  - Continue PEG open to gravity overnight, clamped during the day - Continue full liquids  .  Family can bring soup from home. - TPN has been reduced today to 1/2 rate - Continue calorie count - Patient does not have adequate intake, she will likely continue to need TPN.   Uterine cancer with peritoneal implants leading to ileus: Patient currently receiving chemotherapy.  Patient may need more education about her cancer.  Electrolytes: Replaced with TPN.  TPN managed by  pharmacy.  Pancytopenia, due to chemo.  Blood transfusion on 10/4.  Stable since then. Hypothyroidism, on thyroxine. Moderate malnutrition: On TPN Paroxysmal A-fib: On Xarelto.  Sinus rhythm on metoprolol and Cardizem.  Blood pressure stable.  DVT prophylaxis: SCDs Start: 07/13/2023 2156 rivaroxaban (XARELTO) tablet 20 mg   Code Status: Full code Family Communication: Sister at the bedside Disposition Plan: Status is: Inpatient Remains inpatient appropriate because: On TPN unable to eat     Consultants:   Oncology Palliative care  Procedures:  Multiple procedures as above  Antimicrobials:  None   Subjective:  Patient was seen and examined.  Her sister was at the bedside.  Patient has mild abdominal discomfort but denies any complaints today.  She was very open to conversation.  We discussed about food options, small frequent meals and also bringing any flavor or soups he likes from home and her sister agreed.  Still has small volume frequent watery stool but no incontinence.  Objective: Vitals:   08/06/23 1739 08/06/23 1957 08/07/23 0448 08/07/23 1145  BP:  138/69 (!) 151/79 131/70  Pulse:  80 86 91  Resp: 17 18 18 16   Temp:  98.2 F (36.8 C) 98.3 F (36.8 C) (!) 97.4 F (36.3 C)  TempSrc:  Oral Oral Oral  SpO2:  96% 100% 100%  Weight:      Height:        Intake/Output Summary (Last 24 hours) at 08/07/2023 1318 Last data filed at 08/07/2023 1159 Gross per 24 hour  Intake 1439.38 ml  Output --  Net 1439.38 ml   Filed Weights   07/29/23 0521 08/01/23 1701 08/03/23 0500  Weight: 79.6 kg 79.7 kg 81.1 kg    Examination:  General exam: Patient looks fairly comfortable.  She is interactive and pleasant to conversation.  She does have concern about her cancer that is appropriate. Respiratory system: No added sounds. Cardiovascular system: S1 & S2 heard, RRR. No pedal edema. Gastrointestinal system: Abdomen is nondistended, soft and mild tenderness along the epigastrium.  Bowel sound present.  PEG tube is clean and dry.   Central nervous system: Alert and oriented. No  focal neurological deficits. Extremities: Symmetric 5 x 5 power. Skin: No rashes, lesions or ulcers    Data Reviewed: I have personally reviewed following labs and imaging studies  CBC: Recent Labs  Lab 08/01/23 0542 08/02/23 1330 08/04/23 0534 08/06/23 0559  WBC 2.2* 2.8* 4.5 8.6  HGB 7.6* 9.4* 9.0* 10.2*  HCT 24.8* 29.4* 29.2* 32.5*  MCV 91.2 88.6 91.8 90.3  PLT 129* 116* 116* 154   Basic  Metabolic Panel: Recent Labs  Lab 08/01/23 0542 08/02/23 1330 08/03/23 0725 08/04/23 0534 08/05/23 0545 08/06/23 0559 08/07/23 0604  NA 137   < > 135 137 135 135 133*  K 4.0   < > 4.1 4.2 4.1 3.8 4.0  CL 105   < > 96* 100 99 105 104  CO2 27   < > 29 27 25  21* 19*  GLUCOSE 167*   < > 159* 171* 132* 148* 138*  BUN 27*   < > 25* 24* 20 18 27*  CREATININE 0.66   < > 0.63 0.76 0.72 0.67 0.72  CALCIUM 8.7*   < > 9.0 9.2 9.3 9.3 9.3  MG 1.8  --   --  1.5* 1.6* 1.8 1.6*  PHOS 3.5  --   --  3.5  --   --  3.5   < > = values in this interval not displayed.   GFR: Estimated Creatinine Clearance: 66 mL/min (by C-G formula based on SCr of 0.72 mg/dL). Liver Function Tests: Recent Labs  Lab 08/03/23 0725 08/04/23 0534 08/05/23 0545 08/06/23 0559 08/07/23 0604  AST 26 34 27 41 35  ALT 26 39 34 50* 56*  ALKPHOS 138* 161* 172* 220* 239*  BILITOT 0.5 0.5 0.4 0.6 0.4  PROT 6.0* 6.1* 6.3* 7.0 7.0  ALBUMIN 2.4* 2.3* 2.2* 2.5* 2.7*   No results for input(s): "LIPASE", "AMYLASE" in the last 168 hours. No results for input(s): "AMMONIA" in the last 168 hours. Coagulation Profile: No results for input(s): "INR", "PROTIME" in the last 168 hours.  Cardiac Enzymes: No results for input(s): "CKTOTAL", "CKMB", "CKMBINDEX", "TROPONINI" in the last 168 hours. BNP (last 3 results) No results for input(s): "PROBNP" in the last 8760 hours. HbA1C: No results for input(s): "HGBA1C" in the last 72 hours. CBG: Recent Labs  Lab 08/06/23 1959 08/07/23 0034 08/07/23 0450 08/07/23 0746 08/07/23 1146  GLUCAP 160* 120* 140* 130* 156*   Lipid Profile: No results for input(s): "CHOL", "HDL", "LDLCALC", "TRIG", "CHOLHDL", "LDLDIRECT" in the last 72 hours.  Thyroid Function Tests: No results for input(s): "TSH", "T4TOTAL", "FREET4", "T3FREE", "THYROIDAB" in the last 72 hours. Anemia Panel: No results for input(s): "VITAMINB12", "FOLATE", "FERRITIN", "TIBC", "IRON", "RETICCTPCT" in the last 72  hours. Sepsis Labs: No results for input(s): "PROCALCITON", "LATICACIDVEN" in the last 168 hours.  No results found for this or any previous visit (from the past 240 hour(s)).       Radiology Studies: No results found.      Scheduled Meds:  Chlorhexidine Gluconate Cloth  6 each Topical Daily   diltiazem  120 mg Oral Daily   dorzolamide-timolol  1 drop Both Eyes BID   famotidine  20 mg Oral QHS   feeding supplement  1 Container Oral BID BM   insulin aspart  0-9 Units Subcutaneous Q4H   latanoprost  1 drop Both Eyes QHS   levothyroxine  50 mcg Oral Q0600   metoprolol succinate  12.5 mg Oral Daily   multivitamin  15 mL Oral Daily   pantoprazole  40 mg Oral BID AC   polyethylene glycol  17 g Oral Daily   rivaroxaban  20 mg Oral Q supper   senna  1 tablet Oral Daily   sodium chloride flush  10-40 mL Intracatheter Q12H   Continuous Infusions:  .TPN (CLINIMIX-E) Adult 40 mL/hr at 08/06/23 1908   .TPN (CLINIMIX-E) Adult       LOS: 24 days    Time spent: 35 minutes    Dorcas Carrow, MD [facility]

## 2023-08-07 NOTE — Progress Notes (Signed)
Mobility Specialist - Progress Note   08/07/23 1429  Mobility  Activity Ambulated with assistance in hallway  Level of Assistance Modified independent, requires aide device or extra time  Assistive Device Other (Comment) (IV Pole)  Distance Ambulated (ft) 500 ft  Activity Response Tolerated well  Mobility Referral Yes  $Mobility charge 1 Mobility  Mobility Specialist Start Time (ACUTE ONLY) 0205  Mobility Specialist Stop Time (ACUTE ONLY) 0228  Mobility Specialist Time Calculation (min) (ACUTE ONLY) 23 min   Pt received EOB and agreeable to mobility. No complaints during session. Pt to recliner after session with all needs met.    [facility]

## 2023-08-07 NOTE — Progress Notes (Signed)
Chaplain engaged in a follow-up visit with [place]. Chizaram is still processing the information she received about the state of her body and health yesterday. She is candid about how shocking it has been to learn that information as she was still receiving treatment and getting imaging done regularly. She has felt very emotional yesterday and is still taking everything in. She does recognize however that she cannot change what is happening and is working out for herself what comes next. She voiced, "I don't want a pity party." Chaplain uplifted her statement has significant and healing.   Chaplain provided space for Floral to candidly sharing her feeling and emotions. Chaplain and Kimbley talked about quality of life and perspectives surrounding healing. Chaplain offered reflective listening, support, compassion, and prayer.     08/07/23 1100  Spiritual Encounters  Type of Visit Follow up  Care provided to: Patient  Reason for visit Routine spiritual support  Spiritual Framework  Presenting Themes Goals in life/care;Meaning/purpose/sources of inspiration;Coping tools;Community and relationships;Rituals and practive  Interventions  Spiritual Care Interventions Made Prayer;Established relationship of care and support;Compassionate presence;Reflective listening;Normalization of emotions;Narrative/life review;Bereavement/grief support  Intervention Outcomes  Outcomes Reduced anxiety;Awareness of support;Connection to spiritual care

## 2023-08-08 ENCOUNTER — Inpatient Hospital Stay (HOSPITAL_COMMUNITY): Payer: Medicare HMO

## 2023-08-08 ENCOUNTER — Encounter (HOSPITAL_COMMUNITY): Admission: EM | Disposition: E | Payer: Self-pay | Source: Home / Self Care | Attending: Internal Medicine

## 2023-08-08 DIAGNOSIS — E1122 Type 2 diabetes mellitus with diabetic chronic kidney disease: Secondary | ICD-10-CM | POA: Diagnosis not present

## 2023-08-08 DIAGNOSIS — J9601 Acute respiratory failure with hypoxia: Secondary | ICD-10-CM

## 2023-08-08 DIAGNOSIS — K922 Gastrointestinal hemorrhage, unspecified: Secondary | ICD-10-CM | POA: Diagnosis not present

## 2023-08-08 DIAGNOSIS — N189 Chronic kidney disease, unspecified: Secondary | ICD-10-CM | POA: Diagnosis not present

## 2023-08-08 DIAGNOSIS — K567 Ileus, unspecified: Secondary | ICD-10-CM | POA: Diagnosis not present

## 2023-08-08 DIAGNOSIS — I129 Hypertensive chronic kidney disease with stage 1 through stage 4 chronic kidney disease, or unspecified chronic kidney disease: Secondary | ICD-10-CM

## 2023-08-08 HISTORY — PX: ESOPHAGOGASTRODUODENOSCOPY (EGD) WITH PROPOFOL: SHX5813

## 2023-08-08 HISTORY — PX: SCLEROTHERAPY: SHX6841

## 2023-08-08 HISTORY — PX: HEMOSTASIS CONTROL: SHX6838

## 2023-08-08 HISTORY — PX: HEMOSTASIS CLIP PLACEMENT: SHX6857

## 2023-08-08 LAB — COMPREHENSIVE METABOLIC PANEL
ALT: 40 U/L (ref 0–44)
ALT: 25 U/L (ref 0–44)
AST: 20 U/L (ref 15–41)
AST: 20 U/L (ref 15–41)
Albumin: 2.6 g/dL — ABNORMAL LOW (ref 3.5–5.0)
Albumin: 3.7 g/dL (ref 3.5–5.0)
Alkaline Phosphatase: 229 U/L — ABNORMAL HIGH (ref 38–126)
Alkaline Phosphatase: 149 U/L — ABNORMAL HIGH (ref 38–126)
Anion gap: 10 (ref 5–15)
Anion gap: 10 (ref 5–15)
BUN: 31 mg/dL — ABNORMAL HIGH (ref 8–23)
BUN: UNDETERMINED mg/dL (ref 8–23)
CO2: 21 mmol/L — ABNORMAL LOW (ref 22–32)
CO2: 16 mmol/L — ABNORMAL LOW (ref 22–32)
Calcium: 9.4 mg/dL (ref 8.9–10.3)
Calcium: 8.8 mg/dL — ABNORMAL LOW (ref 8.9–10.3)
Chloride: 99 mmol/L (ref 98–111)
Chloride: 107 mmol/L (ref 98–111)
Creatinine, Ser: 0.75 mg/dL (ref 0.44–1.00)
Creatinine, Ser: UNDETERMINED mg/dL (ref 0.44–1.00)
GFR, Estimated: >60 mL/min (ref >60)
Glucose, Bld: 137 mg/dL — ABNORMAL HIGH (ref 70–99)
Glucose, Bld: 136 mg/dL — ABNORMAL HIGH (ref 70–99)
Potassium: 3.8 mmol/L (ref 3.5–5.1)
Potassium: 4.2 mmol/L (ref 3.5–5.1)
Sodium: 130 mmol/L — ABNORMAL LOW (ref 135–145)
Sodium: 133 mmol/L — ABNORMAL LOW (ref 135–145)
Total Bilirubin: 0.4 mg/dL (ref 0.3–1.2)
Total Bilirubin: 1.8 mg/dL — ABNORMAL HIGH (ref 0.3–1.2)
Total Protein: 7.1 g/dL (ref 6.5–8.1)
Total Protein: 6.9 g/dL (ref 6.5–8.1)

## 2023-08-08 LAB — PREPARE RBC (CROSSMATCH)

## 2023-08-08 LAB — PHOSPHORUS: Phosphorus: 3.8 mg/dL (ref 2.5–4.6)

## 2023-08-08 LAB — BLOOD GAS, ARTERIAL
Acid-base deficit: 9.9 mmol/L — ABNORMAL HIGH (ref 0.0–2.0)
Bicarbonate: 18 mmol/L — ABNORMAL LOW (ref 20.0–28.0)
FIO2: 50 %
MECHVT: 420 mL
O2 Content: 50 L/min
O2 Saturation: 99.9 %
PEEP: 5 cmH2O
Patient temperature: 36.9
RATE: 16 {breaths}/min
pCO2 arterial: 46 mm[Hg] (ref 32–48)
pH, Arterial: 7.2 — ABNORMAL LOW (ref 7.35–7.45)
pO2, Arterial: 138 mm[Hg] — ABNORMAL HIGH (ref 83–108)

## 2023-08-08 LAB — POCT I-STAT, CHEM 8
BUN: 34 mg/dL — ABNORMAL HIGH (ref 8–23)
Calcium, Ion: 1.32 mmol/L (ref 1.15–1.40)
Chloride: 107 mmol/L (ref 98–111)
Creatinine, Ser: 0.7 mg/dL (ref 0.44–1.00)
Glucose, Bld: 193 mg/dL — ABNORMAL HIGH (ref 70–99)
HCT: 18 % — ABNORMAL LOW (ref 36.0–46.0)
Hemoglobin: 6.1 g/dL — CL (ref 12.0–15.0)
Potassium: 3.7 mmol/L (ref 3.5–5.1)
Sodium: 135 mmol/L (ref 135–145)
TCO2: 18 mmol/L — ABNORMAL LOW (ref 22–32)

## 2023-08-08 LAB — MAGNESIUM: Magnesium: 1.5 mg/dL — ABNORMAL LOW (ref 1.7–2.4)

## 2023-08-08 LAB — APTT: aPTT: <20 s — ABNORMAL LOW (ref 24–36)

## 2023-08-08 LAB — GLUCOSE, CAPILLARY
Glucose-Capillary: 119 mg/dL — ABNORMAL HIGH (ref 70–99)
Glucose-Capillary: 123 mg/dL — ABNORMAL HIGH (ref 70–99)
Glucose-Capillary: 128 mg/dL — ABNORMAL HIGH (ref 70–99)
Glucose-Capillary: 140 mg/dL — ABNORMAL HIGH (ref 70–99)
Glucose-Capillary: 144 mg/dL — ABNORMAL HIGH (ref 70–99)
Glucose-Capillary: 155 mg/dL — ABNORMAL HIGH (ref 70–99)
Glucose-Capillary: 161 mg/dL — ABNORMAL HIGH (ref 70–99)

## 2023-08-08 LAB — PROTIME-INR
INR: 1 (ref 0.8–1.2)
Prothrombin Time: 13.8 s (ref 11.4–15.2)

## 2023-08-08 LAB — HEMOGLOBIN AND HEMATOCRIT, BLOOD
HCT: 30.1 % — ABNORMAL LOW (ref 36.0–46.0)
HCT: 32.5 % — ABNORMAL LOW (ref 36.0–46.0)
Hemoglobin: 9.1 g/dL — ABNORMAL LOW (ref 12.0–15.0)
Hemoglobin: 10.5 g/dL — ABNORMAL LOW (ref 12.0–15.0)

## 2023-08-08 SURGERY — ESOPHAGOGASTRODUODENOSCOPY (EGD) WITH PROPOFOL
Anesthesia: General

## 2023-08-08 MED ORDER — VANCOMYCIN HCL 1750 MG/350ML IV SOLN
1750.0000 mg | Freq: Once | INTRAVENOUS | Status: AC
  Administered 2023-08-08: 1750 mg via INTRAVENOUS
  Filled 2023-08-08: qty 350

## 2023-08-08 MED ORDER — SODIUM CHLORIDE 0.9 % IV SOLN
INTRAVENOUS | Status: DC | PRN
Start: 2023-08-08 — End: 2023-08-08

## 2023-08-08 MED ORDER — EMPTY CONTAINERS FLEXIBLE MISC: 900.0000 mg | Status: DC

## 2023-08-08 MED ORDER — PHENYLEPHRINE HCL (PRESSORS) 10 MG/ML IV SOLN
INTRAVENOUS | Status: AC
  Filled 2023-08-08: qty 1

## 2023-08-08 MED ORDER — ALBUMIN HUMAN 5 % IV SOLN
INTRAVENOUS | Status: DC | PRN
Start: 2023-08-08 — End: 2023-08-08

## 2023-08-08 MED ORDER — FENTANYL CITRATE (PF) 100 MCG/2ML IJ SOLN
INTRAMUSCULAR | Status: DC | PRN
Start: 2023-08-08 — End: 2023-08-08
  Administered 2023-08-08: 25 ug via INTRAVENOUS

## 2023-08-08 MED ORDER — PROPOFOL 10 MG/ML IV BOLUS
INTRAVENOUS | Status: DC | PRN
Start: 2023-08-08 — End: 2023-08-08
  Administered 2023-08-08: 120 mg via INTRAVENOUS
  Administered 2023-08-08: 40 ug/kg/min via INTRAVENOUS

## 2023-08-08 MED ORDER — MAGNESIUM SULFATE 4 GM/100ML IV SOLN
4.0000 g | Freq: Once | INTRAVENOUS | Status: AC
  Administered 2023-08-08: 4 g via INTRAVENOUS
  Filled 2023-08-08: qty 100

## 2023-08-08 MED ORDER — FENTANYL 2500MCG IN NS 250ML (10MCG/ML) PREMIX INFUSION
25.0000 ug/h | INTRAVENOUS | Status: DC
  Administered 2023-08-08: 25 ug/h via INTRAVENOUS
  Administered 2023-08-10: 75 ug/h via INTRAVENOUS
  Filled 2023-08-08 (×2): qty 250

## 2023-08-08 MED ORDER — SODIUM CHLORIDE 0.9 % IV SOLN
2.0000 g | INTRAVENOUS | Status: AC
  Administered 2023-08-08: 2 g via INTRAVENOUS
  Filled 2023-08-08: qty 12.5

## 2023-08-08 MED ORDER — MIDAZOLAM HCL 2 MG/2ML IJ SOLN
1.0000 mg | INTRAMUSCULAR | Status: DC | PRN
  Filled 2023-08-08: qty 2

## 2023-08-08 MED ORDER — ALBUMIN HUMAN 5 % IV SOLN
INTRAVENOUS | Status: AC
  Filled 2023-08-08: qty 250

## 2023-08-08 MED ORDER — ROCURONIUM BROMIDE 100 MG/10ML IV SOLN
INTRAVENOUS | Status: DC | PRN
Start: 2023-08-08 — End: 2023-08-08
  Administered 2023-08-08: 100 mg via INTRAVENOUS

## 2023-08-08 MED ORDER — FENTANYL CITRATE PF 50 MCG/ML IJ SOSY
25.0000 ug | PREFILLED_SYRINGE | Freq: Once | INTRAMUSCULAR | Status: AC
  Administered 2023-08-08: 25 ug via INTRAVENOUS

## 2023-08-08 MED ORDER — FAT EMUL FISH OIL/PLANT BASED 20% (SMOFLIPID)IV EMUL
250.0000 mL | INTRAVENOUS | Status: AC
  Administered 2023-08-08: 250 mL via INTRAVENOUS
  Filled 2023-08-08: qty 250

## 2023-08-08 MED ORDER — INSULIN ASPART 100 UNIT/ML IJ SOLN
0.0000 [IU] | Freq: Four times a day (QID) | INTRAMUSCULAR | Status: DC
  Administered 2023-08-08: 2 [IU] via SUBCUTANEOUS
  Administered 2023-08-08: 1 [IU] via SUBCUTANEOUS
  Administered 2023-08-09: 3 [IU] via SUBCUTANEOUS
  Administered 2023-08-09: 1 [IU] via SUBCUTANEOUS
  Administered 2023-08-09 (×2): 2 [IU] via SUBCUTANEOUS
  Administered 2023-08-10: 1 [IU] via SUBCUTANEOUS
  Administered 2023-08-10: 2 [IU] via SUBCUTANEOUS
  Administered 2023-08-10: 1 [IU] via SUBCUTANEOUS
  Administered 2023-08-11: 2 [IU] via SUBCUTANEOUS
  Administered 2023-08-11 – 2023-08-12 (×5): 1 [IU] via SUBCUTANEOUS
  Administered 2023-08-12 – 2023-08-13 (×3): 2 [IU] via SUBCUTANEOUS
  Administered 2023-08-13: 1 [IU] via SUBCUTANEOUS
  Administered 2023-08-14 (×2): 2 [IU] via SUBCUTANEOUS
  Administered 2023-08-14: 1 [IU] via SUBCUTANEOUS
  Administered 2023-08-14 – 2023-08-18 (×16): 2 [IU] via SUBCUTANEOUS
  Administered 2023-08-18: 3 [IU] via SUBCUTANEOUS
  Administered 2023-08-19 (×2): 2 [IU] via SUBCUTANEOUS

## 2023-08-08 MED ORDER — VANCOMYCIN HCL IN DEXTROSE 1-5 GM/200ML-% IV SOLN: 1000.0000 mg | Freq: Once | INTRAVENOUS | Status: DC

## 2023-08-08 MED ORDER — SODIUM CHLORIDE 0.9 % IV SOLN: INTRAVENOUS | Status: DC

## 2023-08-08 MED ORDER — SODIUM CHLORIDE 0.9 % IV SOLN: 10.0000 mL/h | Freq: Once | INTRAVENOUS | Status: DC

## 2023-08-08 MED ORDER — SODIUM CHLORIDE 0.9 % IV BOLUS (SEPSIS)
1000.0000 mL | Freq: Once | INTRAVENOUS | Status: AC
  Administered 2023-08-08: 1000 mL via INTRAVENOUS

## 2023-08-08 MED ORDER — METOPROLOL TARTRATE 5 MG/5ML IV SOLN
INTRAVENOUS | Status: DC | PRN
Start: 2023-08-08 — End: 2023-08-08
  Administered 2023-08-08: 5 mg via INTRAVENOUS

## 2023-08-08 MED ORDER — DOCUSATE SODIUM 50 MG/5ML PO LIQD
100.0000 mg | Freq: Two times a day (BID) | ORAL | Status: DC
  Administered 2023-08-10 (×2): 100 mg
  Filled 2023-08-08 (×3): qty 10

## 2023-08-08 MED ORDER — MIDAZOLAM HCL 2 MG/2ML IJ SOLN
INTRAMUSCULAR | Status: AC
  Filled 2023-08-08: qty 2

## 2023-08-08 MED ORDER — PROTHROMBIN COMPLEX CONC HUMAN 500 UNITS IV KIT
3886.0000 [IU] | PACK | Status: AC
  Administered 2023-08-08: 3886 [IU] via INTRAVENOUS
  Filled 2023-08-08: qty 552
  Filled 2023-08-08: qty 3886

## 2023-08-08 MED ORDER — SODIUM CHLORIDE (PF) 0.9 % IJ SOLN
PREFILLED_SYRINGE | INTRAMUSCULAR | Status: DC | PRN
  Administered 2023-08-08: 2 mL

## 2023-08-08 MED ORDER — SODIUM CHLORIDE 0.9 % IV BOLUS
1000.0000 mL | Freq: Once | INTRAVENOUS | Status: AC
  Administered 2023-08-08: 1000 mL via INTRAVENOUS

## 2023-08-08 MED ORDER — LACTATED RINGERS IV SOLN
150.0000 mL/h | INTRAVENOUS | Status: DC
  Administered 2023-08-09: 150 mL/h via INTRAVENOUS

## 2023-08-08 MED ORDER — MIDAZOLAM HCL 5 MG/5ML IJ SOLN
INTRAMUSCULAR | Status: DC | PRN
Start: 2023-08-08 — End: 2023-08-08
  Administered 2023-08-08: 2 mg via INTRAVENOUS

## 2023-08-08 MED ORDER — VANCOMYCIN HCL IN DEXTROSE 1-5 GM/200ML-% IV SOLN
1000.0000 mg | Freq: Two times a day (BID) | INTRAVENOUS | Status: DC
  Administered 2023-08-09 (×2): 1000 mg via INTRAVENOUS
  Filled 2023-08-08 (×2): qty 200

## 2023-08-08 MED ORDER — PHENYLEPHRINE HCL-NACL 20-0.9 MG/250ML-% IV SOLN
0.0000 ug/min | INTRAVENOUS | Status: DC
  Administered 2023-08-08: 20 ug/min via INTRAVENOUS
  Filled 2023-08-08: qty 250

## 2023-08-08 MED ORDER — PHENYLEPHRINE HCL (PRESSORS) 10 MG/ML IV SOLN
INTRAVENOUS | Status: DC | PRN
Start: 2023-08-08 — End: 2023-08-08
  Administered 2023-08-08: 560 ug via INTRAVENOUS
  Administered 2023-08-08: 240 ug via INTRAVENOUS

## 2023-08-08 MED ORDER — FENTANYL CITRATE (PF) 100 MCG/2ML IJ SOLN
INTRAMUSCULAR | Status: AC
  Filled 2023-08-08: qty 2

## 2023-08-08 MED ORDER — ORAL CARE MOUTH RINSE: 15.0000 mL | OROMUCOSAL | Status: DC

## 2023-08-08 MED ORDER — SODIUM CHLORIDE 0.9 % IV SOLN: INTRAVENOUS | Status: DC | PRN

## 2023-08-08 MED ORDER — METOPROLOL TARTRATE 5 MG/5ML IV SOLN
5.0000 mg | Freq: Once | INTRAVENOUS | Status: DC
  Filled 2023-08-08: qty 5

## 2023-08-08 MED ORDER — FENTANYL BOLUS VIA INFUSION
25.0000 ug | INTRAVENOUS | Status: DC | PRN
  Administered 2023-08-08: 25 ug via INTRAVENOUS
  Administered 2023-08-08 (×2): 50 ug via INTRAVENOUS
  Administered 2023-08-08: 25 ug via INTRAVENOUS
  Administered 2023-08-08 – 2023-08-09 (×5): 50 ug via INTRAVENOUS
  Administered 2023-08-09: 100 ug via INTRAVENOUS
  Administered 2023-08-09 – 2023-08-10 (×8): 50 ug via INTRAVENOUS

## 2023-08-08 MED ORDER — SODIUM CHLORIDE 0.9 % IV SOLN
2.0000 g | Freq: Three times a day (TID) | INTRAVENOUS | Status: DC
  Administered 2023-08-09 – 2023-08-11 (×7): 2 g via INTRAVENOUS
  Filled 2023-08-08 (×7): qty 12.5

## 2023-08-08 MED ORDER — ONDANSETRON HCL 4 MG/2ML IJ SOLN
INTRAMUSCULAR | Status: DC | PRN
Start: 2023-08-08 — End: 2023-08-08
  Administered 2023-08-08: 4 mg via INTRAVENOUS

## 2023-08-08 MED ORDER — SODIUM CHLORIDE 0.9% IV SOLUTION: Freq: Once | INTRAVENOUS | Status: DC

## 2023-08-08 MED ORDER — PROPOFOL 1000 MG/100ML IV EMUL
0.0000 ug/kg/min | INTRAVENOUS | Status: DC
  Administered 2023-08-08: 40 ug/kg/min via INTRAVENOUS
  Administered 2023-08-09: 45 ug/kg/min via INTRAVENOUS
  Administered 2023-08-09: 40 ug/kg/min via INTRAVENOUS
  Administered 2023-08-09: 35 ug/kg/min via INTRAVENOUS
  Administered 2023-08-09: 50 ug/kg/min via INTRAVENOUS
  Administered 2023-08-10 (×2): 45 ug/kg/min via INTRAVENOUS
  Filled 2023-08-08 (×8): qty 100

## 2023-08-08 MED ORDER — ORAL CARE MOUTH RINSE: 15.0000 mL | OROMUCOSAL | Status: DC | PRN

## 2023-08-08 MED ORDER — CLINIMIX E/DEXTROSE (8/10) 8 % IV SOLN
INTRAVENOUS | Status: AC
  Filled 2023-08-08: qty 1000

## 2023-08-08 MED ORDER — PHENYLEPHRINE 80 MCG/ML (10ML) SYRINGE FOR IV PUSH (FOR BLOOD PRESSURE SUPPORT)
PREFILLED_SYRINGE | INTRAVENOUS | Status: DC | PRN
Start: 2023-08-08 — End: 2023-08-08
  Administered 2023-08-08 (×2): 160 ug via INTRAVENOUS
  Administered 2023-08-08 (×2): 80 ug via INTRAVENOUS

## 2023-08-08 MED ORDER — PHENYLEPHRINE HCL-NACL 20-0.9 MG/250ML-% IV SOLN
INTRAVENOUS | Status: DC | PRN
Start: 2023-08-08 — End: 2023-08-08
  Administered 2023-08-08: 50 ug/min via INTRAVENOUS

## 2023-08-08 SURGICAL SUPPLY — 15 items

## 2023-08-08 NOTE — Anesthesia Preprocedure Evaluation (Addendum)
Anesthesia Evaluation  Patient identified by MRN, date of birth, ID band Patient awake    Reviewed: Allergy & Precautions, H&P , NPO status , Patient's Chart, lab work & pertinent test results  Airway Mallampati: II  TM Distance: >3 FB Neck ROM: Full    Dental no notable dental hx. (+) Dental Advisory Given   Pulmonary former smoker   Pulmonary exam normal breath sounds clear to auscultation       Cardiovascular hypertension, Normal cardiovascular exam+ dysrhythmias Atrial Fibrillation (-) pacemaker Rhythm:Regular Rate:Normal     Neuro/Psych  PSYCHIATRIC DISORDERS Anxiety Depression    negative neurological ROS     GI/Hepatic Neg liver ROS,GERD  ,,GI bleed  Currently on TPN   Endo/Other  diabetesHypothyroidism    Renal/GU Renal disease   Uterine cancer s/p chemotherapy    Musculoskeletal negative musculoskeletal ROS (+)    Abdominal   Peds negative pediatric ROS (+)  Hematology  (+) Blood dyscrasia, anemia   Anesthesia Other Findings   Reproductive/Obstetrics negative OB ROS                             Anesthesia Physical Anesthesia Plan  ASA: 4  Anesthesia Plan: General   Post-op Pain Management:    Induction: Intravenous and Rapid sequence  PONV Risk Score and Plan: Treatment may vary due to age or medical condition and Ondansetron  Airway Management Planned: Oral ETT  Additional Equipment:   Intra-op Plan:   Post-operative Plan:   Informed Consent: I have reviewed the patients History and Physical, chart, labs and discussed the procedure including the risks, benefits and alternatives for the proposed anesthesia with the patient or authorized representative who has indicated his/her understanding and acceptance.     Dental advisory given  Plan Discussed with: CRNA  Anesthesia Plan Comments: ("72 year old female with a stage IV uterine cancer status post TAH and  LSO with peritoneal carcinomatosis currently on active chemotherapy remains in the hospital due to persistent nausea and vomiting, intolerance to oral intake.  Patient does have history of persistent A-fib on Xarelto, type 2 diabetes, hypertension.  Complicated hospital course with refractory ileus due to carcinomatosis. Remains in the hospital, not tolerating oral intake.  On TPN. 10/11, patient started bleeding from PEG tube insertion site and also with large amount of bloody vomitus."  Plan for RSI in the setting of ongoing n/v and hematemesis  )        Anesthesia Quick Evaluation

## 2023-08-08 NOTE — Consult Note (Signed)
Sherri Gill 13-Oct-1951  [id number].    Requesting MD: Dr. Elnoria Howard Chief Complaint/Reason for Consult:   HPI: Sherri Gill is a 72 y.o. female with a history of PAF on Xarelto (last dose 10/10 PM) and stage IV recurrent uterine cancer followed by Dr. [name] on carboplatin and paclitaxel who we are asked to see for bleeding.  Patient underwent venting gastrostomy tube placement on 10/3. Patient with bleeding around her peg site and bloody emesis today. She underwent EGD today. Per discussion with Dr. Elnoria Howard patient had a cratered gastric ulcer with bleeding at the greater curvature of the stomach secondary to the G-tube.  The bleeding site appeared to be on the gastric luminal side of the track. This was attempted to be controlled with epi, clip x 2, and purestat spray without achievement of hemostasis. Per discussion, case has already been discussed with IR and they felt that they would not be able to correct the issue. We were asked to consult for ongoing bleeding. Patient remained intubated after endoscopy and was transported to the ICU. Most recent Hgb 6.1. Seen with RN and CCM. Her Xarelto has not been reversed. She is currently getting prbc and is on 46mcg/min of neo with sbp per cuff in 130's.  Hx of Diagnostic laparoscopy, conversion to exploratory laparotomy, partial omentectomy, lysis of adhesions for approximately 60 minutes, total abdominal hysterectomy, bilateral salpingo-oophorectomy, excision of ileal tumor implant and excision of transverse colon epiploica tumor implant by Dr. Pricilla Holm 04/10/2022  ROS: ROS As above, see hpi  Family History  Adopted: Yes  Problem Relation Age of Onset   Dementia Mother    Breast cancer Sister        paternal half-sister   Liver cancer Brother        maternal half-brother   Thyroid cancer Maternal Aunt    Prostate cancer Maternal Uncle    Prostate cancer Cousin    Breast cancer Other    Ovarian cancer Neg Hx    Colon cancer Neg Hx     Endometrial cancer Neg Hx    Pancreatic cancer Neg Hx     Past Medical History:  Diagnosis Date   AKI (acute kidney injury) (HCC)    History of   Ascites    09/2021 paracentesis with almost 3 L of fluid 12/2021 Paracentesis performed with 2.8 L   Carpal tunnel syndrome 02/11/2014   Chronic anemia 08/29/2012   Need colonoscopy or report. Need iron panel.    Dyslipidemia 06/30/2007   Dyspnea    Essential hypertension, benign 06/30/2007   GERD 06/30/2007   History of blood transfusion    History of fatty infiltration of liver    History of migraine    Hx of Herpes simplex meningitis 2015   Also noted to have primary empty sella on imaging at this admission   Insomnia 06/11/2012   Major depressive disorder, recurrent episode, moderate with anxious distress (HCC) 06/30/2007   Nausea and vomiting 01/13/2022   Obesity, BMI 35-40 06/11/2012   Peripheral neuropathy 2/2 T2DM 12/28/2009   Primary empty sella syndrome (HCC) 2015   Noted on imaging during hospitalization for herpes meningitis; no pituitary mass, no hormone w/u at that time, hormonally asymptomatic   Type 2 diabetes mellitus with neurological complications (HCC) 06/30/2007    Past Surgical History:  Procedure Laterality Date   BIOPSY  07/10/2023   Procedure: BIOPSY;  Surgeon: Charna Elizabeth, MD;  Location: [facility];  Service: Gastroenterology;;   COLONOSCOPY W/  POLYPECTOMY  06/28/2004   DENTAL SURGERY     DILATATION & CURETTAGE/HYSTEROSCOPY WITH MYOSURE N/A 01/15/2022   Procedure: DILATATION & CURETTAGE/HYSTEROSCOPY WITH MYOSURE;  Surgeon: Carver Fila, MD;  Location: [facility];  Service: Gynecology;  Laterality: N/A;  DO NOT OPEN HYSTEROSCOPY KIT   ENTEROSCOPY N/A 07/10/2023   Procedure: ENTEROSCOPY;  Surgeon: Charna Elizabeth, MD;  Location: [facility];  Service: Gastroenterology;  Laterality: N/A;   HYSTERECTOMY ABDOMINAL WITH SALPINGO-OOPHORECTOMY Bilateral 04/09/2022   Procedure: HYSTERECTOMY ABDOMINAL BILATERAL  SALPINGO OOPHORECTOMY WITH OMENTECTOMY ,DEBULKING;  Surgeon: Carver Fila, MD;  Location: [facility];  Service: Gynecology;  Laterality: Bilateral;   IR GASTROSTOMY TUBE MOD SED  07/31/2023   IR IMAGING GUIDED PORT INSERTION  01/18/2022   LAPAROSCOPY N/A 04/09/2022   Procedure: LAPAROSCOPY DIAGNOSTIC;  Surgeon: Carver Fila, MD;  Location: [facility];  Service: Gynecology;  Laterality: N/A;   OPERATIVE ULTRASOUND N/A 01/15/2022   Procedure: OPERATIVE ULTRASOUND;  Surgeon: Carver Fila, MD;  Location: [facility];  Service: Gynecology;  Laterality: N/A;   port a cath placement     TUBAL LIGATION     age 73    Social History:  reports that she quit smoking about 44 years ago. Her smoking use included cigarettes. She started smoking about 54 years ago. She has a 2.5 pack-year smoking history. She has never used smokeless tobacco. She reports that she does not drink alcohol and does not use drugs.  Allergies:  Allergies  Allergen Reactions   Codeine Sulfate Nausea And Vomiting and Other (See Comments)    Dizziness   Penicillins Nausea And Vomiting   Percocet [Oxycodone-Acetaminophen] Nausea And Vomiting   Pork-Derived Products     Patietn states : "I am not allergic - I don't eat Pork products because of my Muslim beliefs"   Zestril [Lisinopril] Cough    Medications Prior to Admission  Medication Sig Dispense Refill   acetaminophen (TYLENOL) 500 MG tablet Take 1,000 mg by mouth every 6 (six) hours as needed for mild pain.     albuterol (VENTOLIN HFA) 108 (90 Base) MCG/ACT inhaler Inhale 2 puffs into the lungs every 4 (four) hours as needed for wheezing or shortness of breath. 8 g 0   Biotin 1000 MCG CHEW Chew 1,000 mcg by mouth daily.     Cholecalciferol (VITAMIN D3) 25 MCG (1000 UT) CAPS Take 1,000 Units by mouth daily with breakfast.     diltiazem (CARDIZEM CD) 120 MG 24 hr capsule Take 1 capsule (120 mg total) by mouth daily. 30 capsule 11   dorzolamide-timolol (COSOPT) 22.3-6.8  MG/ML ophthalmic solution Place 1 drop into both eyes in the morning and at bedtime.     hydrALAZINE (APRESOLINE) 25 MG tablet TAKE 1 TABLET BY MOUTH THREE TIMES DAILY 90 tablet 3   HYDROmorphone (DILAUDID) 2 MG tablet Take 1 tablet (2 mg) by mouth 3 times daily as needed for severe pain. 30 tablet 0   latanoprost (XALATAN) 0.005 % ophthalmic solution Place 1 drop into both eyes at bedtime.     lidocaine-prilocaine (EMLA) cream Apply to affected area once as directed (Patient taking differently: Apply 1 Application topically See admin instructions. Apply to affected area as directed for port access) 30 g 3   losartan (COZAAR) 100 MG tablet Take 1 tablet (100 mg total) by mouth at bedtime. 90 tablet 3   magnesium oxide (MAG-OX) 400 (240 Mg) MG tablet Take 1 tablet (400 mg total) by mouth 2 (two) times daily. 60 tablet  1   metFORMIN (GLUCOPHAGE) 1000 MG tablet Take 1 tablet (1,000 mg total) by mouth 2 (two) times daily. 180 tablet 3   methylPREDNISolone (MEDROL DOSEPAK) 4 MG TBPK tablet Take 6 tablets by mouth on day 1 then decrease by 1 tablet daily until finished (6-5-4-3-2-1) (Patient taking differently: Take 4-24 mg by mouth See admin instructions. Take 24 mg (6 tablets) by mouth on day 1 then decrease by 4 mg daily (1 tablet) daily until finished (6-5-4-3-2-1)) 21 tablet 0   ondansetron (ZOFRAN) 8 MG tablet Take 1 tablet (8 mg total) by mouth every 8 (eight) hours as needed for nausea or vomiting. Start on the third day after chemotherapy. 30 tablet 1   pantoprazole (PROTONIX) 40 MG tablet Take 1 tablet by mouth daily. (Patient taking differently: Take 40 mg by mouth daily before breakfast.) 360 tablet 0   polyethylene glycol powder (GLYCOLAX/MIRALAX) 17 GM/SCOOP powder Take 17 g by mouth daily. 238 g 1   prochlorperazine (COMPAZINE) 10 MG tablet Take 1 tablet (10 mg total) by mouth every 6 (six) hours as needed for nausea or vomiting. 30 tablet 1   promethazine (PHENERGAN) 25 MG suppository Place 1  suppository (25 mg total) rectally every 6 (six) hours as needed for up to 12 days for nausea or vomiting. 12 each 0   rivaroxaban (XARELTO) 20 MG TABS tablet TAKE 1 TABLET BY MOUTH ONCE DAILY WITH SUPPER 90 tablet 3   TUMS 500 MG chewable tablet Chew 1 tablet by mouth 3 (three) times daily as needed for indigestion or heartburn.     atorvastatin (LIPITOR) 20 MG tablet Take 1 tablet (20 mg total) by mouth at bedtime. (Patient not taking: Reported on July 31, 2023) 30 tablet 11   Blood Glucose Monitoring Suppl (ONE TOUCH ULTRA MINI) w/Device KIT Please use as directed. 1 each 0   Cholecalciferol 1000 units capsule Take 1 capsule (1,000 Units total) by mouth daily. (Patient not taking: Reported on 07/31/23) 90 capsule 1   glucose blood (ONE TOUCH ULTRA TEST) test strip Use as instructed 100 each 1   Insulin Pen Needle 32G X 4 MM MISC 1 each by Does not apply route daily. 100 each 3   levothyroxine (SYNTHROID) 50 MCG tablet Take 1 tablet (50 mcg total) by mouth daily before breakfast. (Patient not taking: Reported on 07-31-2023) 30 tablet 1   ONETOUCH DELICA LANCETS 33G MISC Please use as directed. 100 each 0   Vent Mode: PRVC FiO2 (%):  [100 %] 100 % Set Rate:  [16 bmp] 16 bmp Vt Set:  [420 mL] 420 mL PEEP:  [5 cmH20] 5 cmH20   Physical Exam: Blood pressure (!) 84/47, pulse 84, temperature (!) 97.5 F (36.4 C), temperature source Temporal, resp. rate (!) 24, height 5\' 3"  (1.6 m), weight 78.9 kg, SpO2 100%. General: WD/WN female who is intubated and sedated.  HEENT: head is normocephalic, atraumatic.  Heart: Tachycardic   Lungs: On vent Abd: Soft, mild distension, no rigidity or guarding. Prior midline scar well healed. IR G-tube in place - there is a clot noted around tubing but no active bleeding around G-tube site.  Skin: warm and dry    Results for orders placed or performed during the hospital encounter of 07/31/23 (from the past 48 hour(s))  Glucose, capillary     Status: Abnormal    Collection Time: 08/06/23  7:59 PM  Result Value Ref Range   Glucose-Capillary 160 (H) 70 - 99 mg/dL    Comment: Glucose reference range  applies only to samples taken after fasting for at least 8 hours.  Glucose, capillary     Status: Abnormal   Collection Time: 08/07/23 12:34 AM  Result Value Ref Range   Glucose-Capillary 120 (H) 70 - 99 mg/dL    Comment: Glucose reference range applies only to samples taken after fasting for at least 8 hours.  Glucose, capillary     Status: Abnormal   Collection Time: 08/07/23  4:50 AM  Result Value Ref Range   Glucose-Capillary 140 (H) 70 - 99 mg/dL    Comment: Glucose reference range applies only to samples taken after fasting for at least 8 hours.  Magnesium     Status: Abnormal   Collection Time: 08/07/23  6:04 AM  Result Value Ref Range   Magnesium 1.6 (L) 1.7 - 2.4 mg/dL    Comment: Performed at [facility], [address], [place], [place] [address]  Phosphorus     Status: None   Collection Time: 08/07/23  6:04 AM  Result Value Ref Range   Phosphorus 3.5 2.5 - 4.6 mg/dL    Comment: Performed at [facility], [address], [place], [place] [address]  Comprehensive metabolic panel     Status: Abnormal   Collection Time: 08/07/23  6:04 AM  Result Value Ref Range   Sodium 133 (L) 135 - 145 mmol/L   Potassium 4.0 3.5 - 5.1 mmol/L   Chloride 104 98 - 111 mmol/L   CO2 19 (L) 22 - 32 mmol/L   Glucose, Bld 138 (H) 70 - 99 mg/dL    Comment: Glucose reference range applies only to samples taken after fasting for at least 8 hours.   BUN 27 (H) 8 - 23 mg/dL   Creatinine, Ser 5.28 0.44 - 1.00 mg/dL   Calcium 9.3 8.9 - 41.3 mg/dL   Total Protein 7.0 6.5 - 8.1 g/dL   Albumin 2.7 (L) 3.5 - 5.0 g/dL   AST 35 15 - 41 U/L   ALT 56 (H) 0 - 44 U/L   Alkaline Phosphatase 239 (H) 38 - 126 U/L   Total Bilirubin 0.4 0.3 - 1.2 mg/dL   GFR, Estimated >24 >40 mL/min    Comment: (NOTE) Calculated using the CKD-EPI  Creatinine Equation (2021)    Anion gap 10 5 - 15    Comment: Performed at [facility], [address], [place], [place] [address]  Glucose, capillary     Status: Abnormal   Collection Time: 08/07/23  7:46 AM  Result Value Ref Range   Glucose-Capillary 130 (H) 70 - 99 mg/dL    Comment: Glucose reference range applies only to samples taken after fasting for at least 8 hours.  Glucose, capillary     Status: Abnormal   Collection Time: 08/07/23 11:46 AM  Result Value Ref Range   Glucose-Capillary 156 (H) 70 - 99 mg/dL    Comment: Glucose reference range applies only to samples taken after fasting for at least 8 hours.  Glucose, capillary     Status: None   Collection Time: 08/07/23  3:35 PM  Result Value Ref Range   Glucose-Capillary 92 70 - 99 mg/dL    Comment: Glucose reference range applies only to samples taken after fasting for at least 8 hours.  Glucose, capillary     Status: Abnormal   Collection Time: 08/07/23  8:12 PM  Result Value Ref Range   Glucose-Capillary 147 (H) 70 - 99 mg/dL    Comment: Glucose  reference range applies only to samples taken after fasting for at least 8 hours.  Glucose, capillary     Status: Abnormal   Collection Time: 08/14/2023 12:25 AM  Result Value Ref Range   Glucose-Capillary 123 (H) 70 - 99 mg/dL    Comment: Glucose reference range applies only to samples taken after fasting for at least 8 hours.  Glucose, capillary     Status: Abnormal   Collection Time: 08/22/2023  4:14 AM  Result Value Ref Range   Glucose-Capillary 128 (H) 70 - 99 mg/dL    Comment: Glucose reference range applies only to samples taken after fasting for at least 8 hours.  Comprehensive metabolic panel     Status: Abnormal   Collection Time: 08/22/2023  6:01 AM  Result Value Ref Range   Sodium 130 (L) 135 - 145 mmol/L   Potassium 3.8 3.5 - 5.1 mmol/L   Chloride 99 98 - 111 mmol/L   CO2 21 (L) 22 - 32 mmol/L   Glucose, Bld 137 (H) 70 - 99 mg/dL    Comment:  Glucose reference range applies only to samples taken after fasting for at least 8 hours.   BUN 31 (H) 8 - 23 mg/dL   Creatinine, Ser 1.30 0.44 - 1.00 mg/dL   Calcium 9.4 8.9 - 86.5 mg/dL   Total Protein 7.1 6.5 - 8.1 g/dL   Albumin 2.6 (L) 3.5 - 5.0 g/dL   AST 20 15 - 41 U/L   ALT 40 0 - 44 U/L   Alkaline Phosphatase 229 (H) 38 - 126 U/L   Total Bilirubin 0.4 0.3 - 1.2 mg/dL   GFR, Estimated >78 >46 mL/min    Comment: (NOTE) Calculated using the CKD-EPI Creatinine Equation (2021)    Anion gap 10 5 - 15    Comment: Performed at [facility], [address], [place], [place] [address]  Magnesium     Status: Abnormal   Collection Time: 08/03/2023  6:01 AM  Result Value Ref Range   Magnesium 1.5 (L) 1.7 - 2.4 mg/dL    Comment: Performed at [facility], [address] [place], [place] [address]  Phosphorus     Status: None   Collection Time: 07/29/2023  6:01 AM  Result Value Ref Range   Phosphorus 3.8 2.5 - 4.6 mg/dL    Comment: Performed at [facility], [address], [place], [place] [address]  Glucose, capillary     Status: Abnormal   Collection Time: 08/05/2023  7:45 AM  Result Value Ref Range   Glucose-Capillary 119 (H) 70 - 99 mg/dL    Comment: Glucose reference range applies only to samples taken after fasting for at least 8 hours.  Glucose, capillary     Status: Abnormal   Collection Time: 08/19/2023 11:29 AM  Result Value Ref Range   Glucose-Capillary 144 (H) 70 - 99 mg/dL    Comment: Glucose reference range applies only to samples taken after fasting for at least 8 hours.  Hemoglobin and hematocrit, blood     Status: Abnormal   Collection Time: 08/11/2023 12:16 PM  Result Value Ref Range   Hemoglobin 9.1 (L) 12.0 - 15.0 g/dL   HCT 02.7 (L) 25.3 - 66.4 %    Comment: Performed at [facility], [address], [place], [place] [address]  Type and screen [facility]     Status: None  (Preliminary result)   Collection Time: 08/16/2023 12:18 PM  Result  Value Ref Range   ABO/RH(D) O POS    Antibody Screen NEG    Sample Expiration 08/11/2023,2359    Unit Number [id number]    Blood Component Type RED CELLS,LR    Unit division 00    Status of Unit ISSUED    Transfusion Status OK TO TRANSFUSE    Crossmatch Result Compatible    Unit Number [id number]    Blood Component Type RED CELLS,LR    Unit division 00    Status of Unit ISSUED    Transfusion Status OK TO TRANSFUSE    Crossmatch Result Compatible    Unit Number [id number]    Blood Component Type RED CELLS,LR    Unit division 00    Status of Unit ALLOCATED    Transfusion Status OK TO TRANSFUSE    Crossmatch Result Compatible    Unit Number [id number]    Blood Component Type RED CELLS,LR    Unit division 00    Status of Unit ISSUED    Transfusion Status OK TO TRANSFUSE    Crossmatch Result      Compatible Performed at [facility], [address] [place], [place] [address]   Prepare RBC (crossmatch)     Status: None   Collection Time: 08/10/2023 12:56 PM  Result Value Ref Range   Order Confirmation      ORDER PROCESSED BY BLOOD BANK Performed at [facility], [address] [place], [place] [address]   I-STAT, [place] [address]     Status: Abnormal   Collection Time: 08/06/2023  3:12 PM  Result Value Ref Range   Sodium 135 135 - 145 mmol/L   Potassium 3.7 3.5 - 5.1 mmol/L   Chloride 107 98 - 111 mmol/L   BUN 34 (H) 8 - 23 mg/dL   Creatinine, Ser 3.23 0.44 - 1.00 mg/dL   Glucose, Bld 557 (H) 70 - 99 mg/dL    Comment: Glucose reference range applies only to samples taken after fasting for at least 8 hours.   Calcium, Ion 1.32 1.15 - 1.40 mmol/L   TCO2 18 (L) 22 - 32 mmol/L   Hemoglobin 6.1 (LL) 12.0 - 15.0 g/dL   HCT 32.2 (L) 02.5 - 42.7 %  Prepare RBC (crossmatch)     Status: None   Collection Time: 08/21/2023  3:19 PM  Result Value Ref Range   Order Confirmation       ORDER PROCESSED BY BLOOD BANK Performed at [facility], [address] [place], [place] [address]    *Note: Due to a large number of results and/or encounters for the requested time period, some results have not been displayed. A complete set of results can be found in Results Review.   No results found.  Anti-infectives (From admission, onward)    Start     Dose/Rate Route Frequency Ordered Stop   07/31/23 1000  ceFAZolin (ANCEF) IVPB 2g/100 mL premix        2 g 200 mL/hr over 30 Minutes Intravenous On call 07/31/23 0912 07/31/23 1126   07/27/23 1800  micafungin (MYCAMINE) 100 mg in sodium chloride 0.9 % 100 mL IVPB  Status:  Discontinued        100 mg 105 mL/hr over 1 Hours Intravenous Every 24 hours 07/27/23 1718 08/01/23 1404       Assessment/Plan KERILYNN KARGE is a 72 y.o. female with a history of PAF on Xarelto (last dose 10/10 PM) and stage IV recurrent uterine cancer followed  by Dr. [name] on carboplatin and paclitaxel who we are asked to see for bleeding.  Patient underwent venting gastrostomy tube placement on 10/3. Patient with bleeding around her peg site and bloody emesis today. She underwent EGD today. Per discussion with Dr. Elnoria Howard patient had a cratered gastric ulcer with bleeding at the greater curvature of the stomach secondary to the G-tube.  The bleeding site appeared to be on the gastric luminal side of the track. This was attempted to be controlled with epi, clip x 2, and purestat spray without achievement of hemostasis. Per discussion, case has already been discussed with IR and they felt that they would not be able to correct the issue. We were asked to consult for ongoing bleeding. Patient remained intubated after endoscopy and was transported to the ICU. Most recent Hgb 6.1. Seen with RN and CCM. Her Xarelto has not been reversed. She is currently getting prbc and is on 72mcg/min of neo with sbp per cuff in 130's.  Discussed case with my  attending. Recommend reversing her anticoagulation and seeing if this corrects her bleeding. Agree with PRBC. Monitor hgb and hemodynamics. If reversing her anticoagulation does not correct the issue, we will need to have clear GOC conversations with family/next of kin to decide if they would like to proceed to the OR or not. Please see my attendings attestation for additional recommendations. Discussed with CCM.   I reviewed nursing notes, last 24 h vitals and pain scores, last 48 h intake and output, last 24 h labs and trends, and last 24 h imaging results.  Sherri Gill, [facility] Surgery 08/02/2023, 3:48 PM Please see Amion for pager number during day hours 7:00am-4:30pm

## 2023-08-08 NOTE — Plan of Care (Signed)
  Problem: Education: Goal: Knowledge of General Education information will improve Description: Including pain rating scale, medication(s)/side effects and non-pharmacologic comfort measures Outcome: Progressing   Problem: Health Behavior/Discharge Planning: Goal: Ability to manage health-related needs will improve Outcome: Progressing   Problem: Clinical Measurements: Goal: Ability to maintain clinical measurements within normal limits will improve Outcome: Progressing Goal: Will remain free from infection Outcome: Progressing Goal: Diagnostic test results will improve Outcome: Progressing Goal: Cardiovascular complication will be avoided Outcome: Progressing   Problem: Activity: Goal: Risk for activity intolerance will decrease Outcome: Progressing   Problem: Nutrition: Goal: Adequate nutrition will be maintained Outcome: Progressing   Problem: Coping: Goal: Level of anxiety will decrease Outcome: Progressing   Problem: Elimination: Goal: Will not experience complications related to bowel motility Outcome: Progressing Goal: Will not experience complications related to urinary retention Outcome: Progressing   Problem: Pain Managment: Goal: General experience of comfort will improve Outcome: Progressing   Problem: Safety: Goal: Ability to remain free from injury will improve Outcome: Progressing   Problem: Skin Integrity: Goal: Risk for impaired skin integrity will decrease Outcome: Progressing   Problem: Education: Goal: Ability to describe self-care measures that may prevent or decrease complications (Diabetes Survival Skills Education) will improve Outcome: Progressing Goal: Individualized Educational Video(s) Outcome: Progressing   Problem: Coping: Goal: Ability to adjust to condition or change in health will improve Outcome: Progressing   Problem: Fluid Volume: Goal: Ability to maintain a balanced intake and output will improve Outcome: Progressing    Problem: Health Behavior/Discharge Planning: Goal: Ability to identify and utilize available resources and services will improve Outcome: Progressing Goal: Ability to manage health-related needs will improve Outcome: Progressing   Problem: Metabolic: Goal: Ability to maintain appropriate glucose levels will improve Outcome: Progressing   Problem: Nutritional: Goal: Maintenance of adequate nutrition will improve Outcome: Progressing Goal: Progress toward achieving an optimal weight will improve Outcome: Progressing   Problem: Skin Integrity: Goal: Risk for impaired skin integrity will decrease Outcome: Progressing   Problem: Tissue Perfusion: Goal: Adequacy of tissue perfusion will improve Outcome: Progressing   

## 2023-08-08 NOTE — Progress Notes (Signed)
Pharmacy Antibiotic Note  Sherri Gill is a 71 y.o. female who is known to pharmacy from TPN consult.  Pharmacy has been consulted on 08/08/23 to dose vancomycin an cefepime for sepsis.  Plan: - cefepime 2gm q8h - vancomycin 1750 mg IV x1, then 1000 mg q12h for est AUC 513   ______________________________________________ Height: 5\' 3"  (160 cm) Weight: 78.9 kg (173 lb 15.1 oz) IBW/kg (Calculated) : 52.4  Temp (24hrs), Avg:98.9 F (37.2 C), Min:97 F (36.1 C), Max:102.9 F (39.4 C)  Recent Labs  Lab 08/02/23 1330 08/03/23 0725 08/04/23 0534 08/05/23 0545 08/06/23 0559 08/07/23 0604 08/08/23 0601 08/08/23 1512  WBC 2.8*  --  4.5  --  8.6  --   --   --   CREATININE 0.69   < > 0.76 0.72 0.67 0.72 0.75 0.70   < > = values in this interval not displayed.    Estimated Creatinine Clearance: 65.1 mL/min (by C-G formula based on SCr of 0.7 mg/dL).    Allergies  Allergen Reactions   Codeine Sulfate Nausea And Vomiting and Other (See Comments)    Dizziness   Penicillins Nausea And Vomiting   Percocet [Oxycodone-Acetaminophen] Nausea And Vomiting   Pork-Derived Products     Patietn states : "I am not allergic - I don't eat Pork products because of my Muslim beliefs"   Zestril [Lisinopril] Cough     Thank you for allowing pharmacy to be a part of this patient's care.  Lucia Gaskins 08/08/2023 9:24 PM

## 2023-08-08 NOTE — Progress Notes (Addendum)
PHARMACY - TOTAL PARENTERAL NUTRITION CONSULT NOTE   Indication: Prolonged ileus  Patient Measurements: Height: 5\' 3"  (160 cm) Weight: 78.9 kg (173 lb 15.1 oz) IBW/kg (Calculated) : 52.4 TPN AdjBW (KG): 59.5 Body mass index is 30.81 kg/m.  Assessment:  [name] admitted 9/16 for nausea/vomiting x 2.5 weeks. Patient with h/o recurrent uterine cancer--Bx taken during 9/18 small bowel enteroscopy but no signs of malignant obstruction, negative for malignancy. Oncology recommended resumption of chemotherapy during this admission--carboplatin and paclitaxel completed 9/25.   Patient has severe malnutrition given inability to keep food down with continued N/V during this admission and PTA. Pharmacy consulted for TPN management.  - Carcinomatosis causing delayed gastric motility   Glucose / Insulin: Hx of T2DM -BG goal < 180. Range: 92-156 (6 units SSI/24 hrs) Electrolytes: CorrCa (10.5) elevated, Na 130, mag 1.5 Renal: SCr, BUN WNL Hepatic: Alk Phos elevated. ALT has returned to WNL - Albumin low  - TG, Tbili WNL Intake / Output; MIVF: Strict I/O not measured -UOP: 2x unmeasured, no documentation other other output  per charting last 24hr, 2x unmeasured stool -PO intake: 480 mL -no mIVF  GI Imaging: - 9/20 DG UGI: Gastric fold thickening as shown on prior CT and endoscopy. Mildly dilated duodenum, with sluggish flow of contrast through the duodenum--possibility of a more distal SBO is not excluded given this appearance. - 9/29 abd Xray: Slight increase in central small bowel dilatation.   GI Surgeries / Procedures:  - 9/18: small bowel endoscopy: Normal appearing, widely patent esophagus and GEJ. Edematous gastric folds-biopsied. A single duodenal polyp in the post-bulbar duodenum-biopsied - 10/3:  placement of gastrostomy tube today to be used for venting  Central access: port TPN start date:  9/26  Nutritional Goals:  Current TPN (Clinimix E 8/10) providing 77g protein and  634 kcal daily and on MWF with fat administration adds 500 kcal more to = 1134 kcal on those days  RD Assessment: Estimated Needs Total Energy Estimated Needs: 1650-1850 Total Protein Estimated Needs: 85-100g Total Fluid Estimated Needs: 1.8L/day  Current Nutrition:  - TPN 9/26>> - 10/4: CLD - 10/7: calorie count started, Glucerna 237 mL TID ordered  - 10/8: pt has n/v wit poor oral intake. Per Dr. Maryfrances Bunnell, cont TPN for now - 10/11: TPN continues at half rate, supplements started per RD and on [place] diet  Plan:  Due to the Baxter IV fluid disruption, we will be switching all adult compounded parenteral nutrition to premade Clinimix products +/- fat emulsion infusion. Our goal will be to continue providing as close to 100% of our patient's nutritional needs. Due to the volatile availability of different Clinimix concentrations we may not be able to meet 100% of adult patient nutritional goals at this time.   This AM: Mag Sulfate 4g IV x 1  Today, at 1800:  Continue Clinimix E 8/10 @ 40 mL/hr. No lipids at this time. TPN already reduced to half of goal on 10/7 With PO intake not yet being adequate, will at least add back lipid administration on MWF for a little extra calories and prevent FA deficiency Pt taking PO medications, MVI transitioned to PO Continue sSSI but will change to q6h TPN labs Mon, Thurs. Check additional labs PRN. Follow for ability to wean off of TPN once tolerating PO intake or if PO intake does not improve may need to re-consider increasing TPN back towards goal per RD   Hessie Knows, PharmD, BCPS Secure Chat if ?s 08/08/2023 9:40 AM

## 2023-08-08 NOTE — Transfer of Care (Signed)
Immediate Anesthesia Transfer of Care Note  Patient: Sherri Gill  Procedure(s) Performed: ESOPHAGOGASTRODUODENOSCOPY (EGD) WITH PROPOFOL HEMOSTASIS CLIP PLACEMENT HEMOSTASIS CONTROL SCLEROTHERAPY  Patient Location: ICU  Anesthesia Type:General  Level of Consciousness: sedated and Patient remains intubated per anesthesia plan  Airway & Oxygen Therapy: Patient remains intubated per anesthesia plan  Post-op Assessment: Report given to RN and Post -op Vital signs reviewed and stable  Post vital signs: Reviewed and stable  Last Vitals:  Vitals Value Taken Time  BP 163/57 08/08/23 1609  Temp    Pulse 93 08/08/23 1610  Resp 19 08/08/23 1610  SpO2 100 % 08/08/23 1610  Vitals shown include unfiled device data.  Last Pain:  Vitals:   08/08/23 1410  TempSrc: Temporal  PainSc:       Patients Stated Pain Goal: 1 (08/08/23 0523)  Complications: No notable events documented.

## 2023-08-08 NOTE — H&P (View-Only) (Signed)
PROGRESS NOTE    Sherri Gill  YNW:[id number] DOB: 04-20-1951 DOA: 07/07/2023 PCP: [name], MD    Brief Narrative:  72 year old female with a stage IV uterine cancer status post TAH and LSO with peritoneal carcinomatosis currently on active chemotherapy remains in the hospital due to persistent nausea and vomiting, intolerance to oral intake.  Patient does have history of persistent A-fib on Xarelto, type 2 diabetes, hypertension.  Complicated hospital course with refractory ileus due to carcinomatosis. Remains in the hospital, not tolerating oral intake.  On TPN. 10/11, patient started bleeding from PEG tube insertion site and also with large amount of bloody vomitus.   Assessment & Plan:   Persistent refractory ileus due to carcinomatosis Admitted 9/16, NG placed in the ER.  Multiple attempts to clamp/remove NG over the subsequent weeks were unsuccessful and eventually venting PEG was recommended. S/p venting PEG on 10/3.  Since venting PEG placement, patient did have some response, still not able to eat.    - Continue PEG open to gravity overnight, clamped during the day - Continue full liquids .  Holding today due to bleeding. - TPN has been reduced today to 1/2 rate - Patient does not have adequate intake, she will likely continue to need TPN.   Bleeding through the PEG tube site, suspect internal bleeding: IR notified.  May need angiogram.  Unable to control bleeding with pressure. Stat hemoglobin.  1 L IV fluid bolus.  May need blood transfusion if hemoglobin less than 8. Keep NPO.  Hold Xarelto.  Uterine cancer with peritoneal implants leading to ileus: Patient currently receiving chemotherapy.   Oncology following.  Electrolytes: Replaced with TPN.  TPN managed by  pharmacy.  Pancytopenia, due to chemo.  Blood transfusion on 10/4.  Stable since then.  Recheck today. Hypothyroidism, on thyroxine. Moderate malnutrition: On TPN Paroxysmal A-fib: On Xarelto.   Sinus rhythm on metoprolol and Cardizem.  Blood pressure stable.  Holding Xarelto today.  DVT prophylaxis: SCDs Start: 07/05/2023 2156   Code Status: Full code Family Communication: None at the bedside. Disposition Plan: Status is: Inpatient Remains inpatient appropriate because: On TPN unable to eat     Consultants:  Oncology Palliative care Interventional radiology.  Procedures:  Multiple procedures as above  Antimicrobials:  None   Subjective:  Patient seen in the morning rounds.  She had about 1 cup full of vomiting last night after trying liquids yesterday. Patient was now convinced that she has incurable cancer as she also talked to her oncologist. I was called back to patient's room that she is bleeding through her PEG tube site.  Patient has brisk bleeding through the PEG tube insertion site.  Later on she had 500 mL of fresh blood through hematemesis.  Objective: Vitals:   08/07/23 0448 08/07/23 1145 08/07/23 2009 07/29/2023 0411  BP: (!) 151/79 131/70 133/65 124/69  Pulse: 86 91 73 76  Resp: 18 16 14 14   Temp: 98.3 F (36.8 C) (!) 97.4 F (36.3 C) 98.3 F (36.8 C) 98 F (36.7 C)  TempSrc: Oral Oral Oral Oral  SpO2: 100% 100% 98% 98%  Weight:    78.9 kg  Height:        Intake/Output Summary (Last 24 hours) at 07/29/2023 1156 Last data filed at 08/18/2023 0900 Gross per 24 hour  Intake 814.04 ml  Output 101 ml  Net 713.04 ml   Filed Weights   08/01/23 1701 08/03/23 0500 08/09/2023 0411  Weight: 79.7 kg 81.1 kg 78.9 kg  Examination:  General exam: Patient looked fairly comfortable.  She is interactive and pleasant to conversation.  Respiratory system: No added sounds. Cardiovascular system: S1 & S2 heard, RRR. No pedal edema. Gastrointestinal system: Abdomen is nondistended, soft and mild tenderness along the PEG tube site.  Bowel sound present.  Brisk fresh bleeding noted along the tube site.  Central nervous system: Alert and oriented. No focal  neurological deficits. Extremities: Symmetric 5 x 5 power. Skin: No rashes, lesions or ulcers    Data Reviewed: I have personally reviewed following labs and imaging studies  CBC: Recent Labs  Lab 08/02/23 1330 08/04/23 0534 08/06/23 0559  WBC 2.8* 4.5 8.6  HGB 9.4* 9.0* 10.2*  HCT 29.4* 29.2* 32.5*  MCV 88.6 91.8 90.3  PLT 116* 116* 154   Basic Metabolic Panel: Recent Labs  Lab 08/04/23 0534 08/05/23 0545 08/06/23 0559 08/07/23 0604 08/25/2023 0601  NA 137 135 135 133* 130*  K 4.2 4.1 3.8 4.0 3.8  CL 100 99 105 104 99  CO2 27 25 21* 19* 21*  GLUCOSE 171* 132* 148* 138* 137*  BUN 24* 20 18 27* 31*  CREATININE 0.76 0.72 0.67 0.72 0.75  CALCIUM 9.2 9.3 9.3 9.3 9.4  MG 1.5* 1.6* 1.8 1.6* 1.5*  PHOS 3.5  --   --  3.5 3.8   GFR: Estimated Creatinine Clearance: 65.1 mL/min (by C-G formula based on SCr of 0.75 mg/dL). Liver Function Tests: Recent Labs  Lab 08/04/23 0534 08/05/23 0545 08/06/23 0559 08/07/23 0604 08/28/2023 0601  AST 34 27 41 35 20  ALT 39 34 50* 56* 40  ALKPHOS 161* 172* 220* 239* 229*  BILITOT 0.5 0.4 0.6 0.4 0.4  PROT 6.1* 6.3* 7.0 7.0 7.1  ALBUMIN 2.3* 2.2* 2.5* 2.7* 2.6*   No results for input(s): "LIPASE", "AMYLASE" in the last 168 hours. No results for input(s): "AMMONIA" in the last 168 hours. Coagulation Profile: No results for input(s): "INR", "PROTIME" in the last 168 hours.  Cardiac Enzymes: No results for input(s): "CKTOTAL", "CKMB", "CKMBINDEX", "TROPONINI" in the last 168 hours. BNP (last 3 results) No results for input(s): "PROBNP" in the last 8760 hours. HbA1C: No results for input(s): "HGBA1C" in the last 72 hours. CBG: Recent Labs  Lab 08/07/23 2012 08/23/2023 0025 08/14/2023 0414 08/07/2023 0745 08/10/2023 1129  GLUCAP 147* 123* 128* 119* 144*   Lipid Profile: No results for input(s): "CHOL", "HDL", "LDLCALC", "TRIG", "CHOLHDL", "LDLDIRECT" in the last 72 hours.  Thyroid Function Tests: No results for input(s): "TSH",  "T4TOTAL", "FREET4", "T3FREE", "THYROIDAB" in the last 72 hours. Anemia Panel: No results for input(s): "VITAMINB12", "FOLATE", "FERRITIN", "TIBC", "IRON", "RETICCTPCT" in the last 72 hours. Sepsis Labs: No results for input(s): "PROCALCITON", "LATICACIDVEN" in the last 168 hours.  No results found for this or any previous visit (from the past 240 hour(s)).       Radiology Studies: No results found.      Scheduled Meds:  Chlorhexidine Gluconate Cloth  6 each Topical Daily   diltiazem  120 mg Oral Daily   dorzolamide-timolol  1 drop Both Eyes BID   famotidine  20 mg Oral QHS   feeding supplement  1 Container Oral BID BM   insulin aspart  0-9 Units Subcutaneous Q6H   latanoprost  1 drop Both Eyes QHS   levothyroxine  50 mcg Oral Q0600   metoprolol succinate  12.5 mg Oral Daily   multivitamin  15 mL Oral Daily   pantoprazole  40 mg Oral BID AC  polyethylene glycol  17 g Oral Daily   senna  1 tablet Oral Daily   sodium chloride flush  10-40 mL Intracatheter Q12H   Continuous Infusions:  .TPN (CLINIMIX-E) Adult 40 mL/hr at 08/15/2023 0328   TPN (CLINIMIX-E) Adult     And   fat emul(SMOFlipid)     magnesium sulfate bolus IVPB 4 g (08/07/2023 1037)     LOS: 25 days    Time spent: 52 minutes    Dorcas Carrow, MD [facility]

## 2023-08-08 NOTE — Progress Notes (Signed)
PROGRESS NOTE    Sherri Gill  YNW:[id number] DOB: 04-20-1951 DOA: 07/07/2023 PCP: [name], MD    Brief Narrative:  72 year old female with a stage IV uterine cancer status post TAH and LSO with peritoneal carcinomatosis currently on active chemotherapy remains in the hospital due to persistent nausea and vomiting, intolerance to oral intake.  Patient does have history of persistent A-fib on Xarelto, type 2 diabetes, hypertension.  Complicated hospital course with refractory ileus due to carcinomatosis. Remains in the hospital, not tolerating oral intake.  On TPN. 10/11, patient started bleeding from PEG tube insertion site and also with large amount of bloody vomitus.   Assessment & Plan:   Persistent refractory ileus due to carcinomatosis Admitted 9/16, NG placed in the ER.  Multiple attempts to clamp/remove NG over the subsequent weeks were unsuccessful and eventually venting PEG was recommended. S/p venting PEG on 10/3.  Since venting PEG placement, patient did have some response, still not able to eat.    - Continue PEG open to gravity overnight, clamped during the day - Continue full liquids .  Holding today due to bleeding. - TPN has been reduced today to 1/2 rate - Patient does not have adequate intake, she will likely continue to need TPN.   Bleeding through the PEG tube site, suspect internal bleeding: IR notified.  May need angiogram.  Unable to control bleeding with pressure. Stat hemoglobin.  1 L IV fluid bolus.  May need blood transfusion if hemoglobin less than 8. Keep NPO.  Hold Xarelto.  Uterine cancer with peritoneal implants leading to ileus: Patient currently receiving chemotherapy.   Oncology following.  Electrolytes: Replaced with TPN.  TPN managed by  pharmacy.  Pancytopenia, due to chemo.  Blood transfusion on 10/4.  Stable since then.  Recheck today. Hypothyroidism, on thyroxine. Moderate malnutrition: On TPN Paroxysmal A-fib: On Xarelto.   Sinus rhythm on metoprolol and Cardizem.  Blood pressure stable.  Holding Xarelto today.  DVT prophylaxis: SCDs Start: 07/05/2023 2156   Code Status: Full code Family Communication: None at the bedside. Disposition Plan: Status is: Inpatient Remains inpatient appropriate because: On TPN unable to eat     Consultants:  Oncology Palliative care Interventional radiology.  Procedures:  Multiple procedures as above  Antimicrobials:  None   Subjective:  Patient seen in the morning rounds.  She had about 1 cup full of vomiting last night after trying liquids yesterday. Patient was now convinced that she has incurable cancer as she also talked to her oncologist. I was called back to patient's room that she is bleeding through her PEG tube site.  Patient has brisk bleeding through the PEG tube insertion site.  Later on she had 500 mL of fresh blood through hematemesis.  Objective: Vitals:   08/07/23 0448 08/07/23 1145 08/07/23 2009 07/29/2023 0411  BP: (!) 151/79 131/70 133/65 124/69  Pulse: 86 91 73 76  Resp: 18 16 14 14   Temp: 98.3 F (36.8 C) (!) 97.4 F (36.3 C) 98.3 F (36.8 C) 98 F (36.7 C)  TempSrc: Oral Oral Oral Oral  SpO2: 100% 100% 98% 98%  Weight:    78.9 kg  Height:        Intake/Output Summary (Last 24 hours) at 07/29/2023 1156 Last data filed at 08/18/2023 0900 Gross per 24 hour  Intake 814.04 ml  Output 101 ml  Net 713.04 ml   Filed Weights   08/01/23 1701 08/03/23 0500 08/09/2023 0411  Weight: 79.7 kg 81.1 kg 78.9 kg  Examination:  General exam: Patient looked fairly comfortable.  She is interactive and pleasant to conversation.  Respiratory system: No added sounds. Cardiovascular system: S1 & S2 heard, RRR. No pedal edema. Gastrointestinal system: Abdomen is nondistended, soft and mild tenderness along the PEG tube site.  Bowel sound present.  Brisk fresh bleeding noted along the tube site.  Central nervous system: Alert and oriented. No focal  neurological deficits. Extremities: Symmetric 5 x 5 power. Skin: No rashes, lesions or ulcers    Data Reviewed: I have personally reviewed following labs and imaging studies  CBC: Recent Labs  Lab 08/02/23 1330 08/04/23 0534 08/06/23 0559  WBC 2.8* 4.5 8.6  HGB 9.4* 9.0* 10.2*  HCT 29.4* 29.2* 32.5*  MCV 88.6 91.8 90.3  PLT 116* 116* 154   Basic Metabolic Panel: Recent Labs  Lab 08/04/23 0534 08/05/23 0545 08/06/23 0559 08/07/23 0604 08/25/2023 0601  NA 137 135 135 133* 130*  K 4.2 4.1 3.8 4.0 3.8  CL 100 99 105 104 99  CO2 27 25 21* 19* 21*  GLUCOSE 171* 132* 148* 138* 137*  BUN 24* 20 18 27* 31*  CREATININE 0.76 0.72 0.67 0.72 0.75  CALCIUM 9.2 9.3 9.3 9.3 9.4  MG 1.5* 1.6* 1.8 1.6* 1.5*  PHOS 3.5  --   --  3.5 3.8   GFR: Estimated Creatinine Clearance: 65.1 mL/min (by C-G formula based on SCr of 0.75 mg/dL). Liver Function Tests: Recent Labs  Lab 08/04/23 0534 08/05/23 0545 08/06/23 0559 08/07/23 0604 08/28/2023 0601  AST 34 27 41 35 20  ALT 39 34 50* 56* 40  ALKPHOS 161* 172* 220* 239* 229*  BILITOT 0.5 0.4 0.6 0.4 0.4  PROT 6.1* 6.3* 7.0 7.0 7.1  ALBUMIN 2.3* 2.2* 2.5* 2.7* 2.6*   No results for input(s): "LIPASE", "AMYLASE" in the last 168 hours. No results for input(s): "AMMONIA" in the last 168 hours. Coagulation Profile: No results for input(s): "INR", "PROTIME" in the last 168 hours.  Cardiac Enzymes: No results for input(s): "CKTOTAL", "CKMB", "CKMBINDEX", "TROPONINI" in the last 168 hours. BNP (last 3 results) No results for input(s): "PROBNP" in the last 8760 hours. HbA1C: No results for input(s): "HGBA1C" in the last 72 hours. CBG: Recent Labs  Lab 08/07/23 2012 08/23/2023 0025 08/14/2023 0414 08/07/2023 0745 08/10/2023 1129  GLUCAP 147* 123* 128* 119* 144*   Lipid Profile: No results for input(s): "CHOL", "HDL", "LDLCALC", "TRIG", "CHOLHDL", "LDLDIRECT" in the last 72 hours.  Thyroid Function Tests: No results for input(s): "TSH",  "T4TOTAL", "FREET4", "T3FREE", "THYROIDAB" in the last 72 hours. Anemia Panel: No results for input(s): "VITAMINB12", "FOLATE", "FERRITIN", "TIBC", "IRON", "RETICCTPCT" in the last 72 hours. Sepsis Labs: No results for input(s): "PROCALCITON", "LATICACIDVEN" in the last 168 hours.  No results found for this or any previous visit (from the past 240 hour(s)).       Radiology Studies: No results found.      Scheduled Meds:  Chlorhexidine Gluconate Cloth  6 each Topical Daily   diltiazem  120 mg Oral Daily   dorzolamide-timolol  1 drop Both Eyes BID   famotidine  20 mg Oral QHS   feeding supplement  1 Container Oral BID BM   insulin aspart  0-9 Units Subcutaneous Q6H   latanoprost  1 drop Both Eyes QHS   levothyroxine  50 mcg Oral Q0600   metoprolol succinate  12.5 mg Oral Daily   multivitamin  15 mL Oral Daily   pantoprazole  40 mg Oral BID AC  polyethylene glycol  17 g Oral Daily   senna  1 tablet Oral Daily   sodium chloride flush  10-40 mL Intracatheter Q12H   Continuous Infusions:  .TPN (CLINIMIX-E) Adult 40 mL/hr at 08/15/2023 0328   TPN (CLINIMIX-E) Adult     And   fat emul(SMOFlipid)     magnesium sulfate bolus IVPB 4 g (08/07/2023 1037)     LOS: 25 days    Time spent: 52 minutes    Dorcas Carrow, MD [facility]

## 2023-08-08 NOTE — Op Note (Signed)
[facility] Patient Name: Sherri Gill Procedure Date: 08/28/2023 MRN: [id number] Attending MD: Jeani Hawking , MD, [id number] Date of Birth: 10/12/1951 CSN: [id number] Age: 72 Admit Type: Outpatient Procedure:                Upper GI endoscopy Indications:              Hematemesis Providers:                Jeani Hawking, MD, Lorenza Evangelist, RN, Geoffery Lyons, Technician, Harrington Challenger, Technician Referring MD:             Artis Delay Medicines:                General Anesthesia Complications:            No immediate complications. Estimated Blood Loss:     Estimated blood loss: none. Procedure:                Pre-Anesthesia Assessment:                           - Prior to the procedure, a History and Physical                            was performed, and patient medications and                            allergies were reviewed. The patient's tolerance of                            previous anesthesia was also reviewed. The risks                            and benefits of the procedure and the sedation                            options and risks were discussed with the patient.                            All questions were answered, and informed consent                            was obtained. Prior Anticoagulants: The patient has                            taken Xarelto (rivaroxaban), last dose was 1 day                            prior to procedure. ASA Grade Assessment: IV - A                            patient with severe systemic disease that is a  constant threat to life. After reviewing the risks                            and benefits, the patient was deemed in                            satisfactory condition to undergo the procedure.                           - Sedation was administered by an anesthesia                            professional. General anesthesia was attained.                            After obtaining informed consent, the endoscope was                            passed under direct vision. Throughout the                            procedure, the patient's blood pressure, pulse, and                            oxygen saturations were monitored continuously. The                            GIF-H190 ([id number]) Olympus endoscope was introduced                            through the mouth, and advanced to the duodenal                            bulb. The upper GI endoscopy was performed with                            difficulty due to excessive bleeding. The patient                            tolerated the procedure well. Scope In: Scope Out: Findings:      The esophagus was normal.      One oozing cratered gastric ulcer with oozing hemorrhage (Forrest Class       Ib) was found on the greater curvature of the stomach. The lesion was 10       mm in largest dimension. Area was unsuccessfully injected with 3 mL of a       0.1 mg/mL solution of epinephrine for hemostasis. Area was       unsuccessfully injected with 6 mL PuraStat gel for hemostasis. For       hemostasis, two hemostatic clips were successfully placed (MR safe).       Clip manufacturer: [employer]. To stop active bleeding,       hemostatic spray was deployed. Two sprays were applied. Oozing bleeding       persisted despite treatment with hemostatic spray.  The examined duodenum was normal.      The source of bleeding was localized to the G-tube site. There was a       moderate bleeding from an ulceration secondary to the G-tube. The       bleeding site appeared to be on the gastric luminal side of the track.       Visualization of the blooding was noted through the transparent balloon.       Before the balloon was deflated, an attempt was made to slow/arrest the       bleeding with PuraStat. The bleeding continued and the decision was made       to completely deflate the balloong. Fortunately the rate of  bleeding did       not change. Further application of the PuraStat was made and a total of       6 mL was injected in the area. The bleeding broke through. The area was       washed further and it appears that the exact site of bleeding was       localized. A Mantis hemoclip was deployed and there was a temporary       cessation of bleeding, but then more bleeding ensued. A second clip was       placed without success. Approximately 3 mL of 1:10,000 Epi was injected       without any slowing of the bleeding. Two applications of hemospray were       deployed and the oozing continued to persist. It was subjectively       slower. The case was discussed with IR and they felt that they would not       be able to correct the issue. Impression:               - Normal esophagus.                           - Oozing gastric ulcer with oozing hemorrhage                            (Forrest Class Ib). Treatment not successful. Clip                            manufacturer: [employer]. Clips (MR safe)                            were placed. hemostatic spray applied.                           - Normal examined duodenum.                           - No specimens collected. Moderate Sedation:      Not Applicable - Patient had care per Anesthesia. Recommendation:           - Return patient to ICU for ongoing care.                           - NPO.                           -  Continue present medications.                           - Hold Xarelto.                           - Stat Surgical consultation. Procedure Code(s):        --- Professional ---                           [phone], Esophagogastroduodenoscopy, flexible,                            transoral; with control of bleeding, any method Diagnosis Code(s):        --- Professional ---                           K25.4, Chronic or unspecified gastric ulcer with                            hemorrhage                           K92.0, Hematemesis CPT  copyright 2022 American Medical Association. All rights reserved. The codes documented in this report are preliminary and upon coder review may  be revised to meet current compliance requirements. Jeani Hawking, MD Jeani Hawking, MD 08/07/2023 3:56:11 PM This report has been signed electronically. Number of Addenda: 0

## 2023-08-08 NOTE — Sepsis Progress Note (Signed)
Elink monitoring for the code sepsis protocol.  

## 2023-08-08 NOTE — Progress Notes (Signed)
Patient called this nurse into room and is bleeding from around her peg site, dressing reinforces, MD notified and is in room to assess site

## 2023-08-08 NOTE — Progress Notes (Signed)
Nutrition Follow-up  DOCUMENTATION CODES:   Non-severe (moderate) malnutrition in context of chronic illness  INTERVENTION:   -Continue TPN as long as it is within GOC  -Recommend increasing to meet as close to nutritional needs as possible given pt is now NPO -Daily weights while on TPN  -Resume Boost Breeze as tolerated once diet is advanced  NUTRITION DIAGNOSIS:   Moderate Malnutrition related to chronic illness, cancer and cancer related treatments as evidenced by mild muscle depletion, energy intake < or equal to 75% for > or equal to 1 month.  Ongoing.  GOAL:   Patient will meet greater than or equal to 90% of their needs  Not meeting.  MONITOR:   PO intake, Supplement acceptance, Labs, Weight trends, I & O's (TPN)  ASSESSMENT:   72 y.o. 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  9/16: admitted, NPO, NGT placed 9/18: NGT removed, CLD ->Regular diet ->CLD 9/19: Soft diet ->FLD 9/23: Soft diet 9/24: NPO, NGT replaced 9/26: clamping trials 9/29: clamping trails -> failed, NGT back to suction 9/30: clamping trails resumed 10/2: vomited; NGT back to suction 10/3: venting G-tube placed 10/4: NGT removed  Patient having EGD today and is currently NPO given increased bloody vomiting and pt was bleeding from PEG site.  Patient continuing Clinimix E 8/10 @ 40 ml/hr, providing ~633 kcals and 76g protein. Pharmacy adding SMOFlipid tonight to provide additional 500 kcals. Total received will be ~1133 kcals (68% of kcal needs) and 76g protein (~89% of protein needs). Likely needs Clinimix increased if possible to help meet kcal needs, hopefully at least 75%.  Admission weight;  178 lbs Current weight: 173 lbs  Medications: Pepcid, MVI, Miralax, Senokot  Labs reviewed: CBGs: 119-144 Low Na Low Mg   Diet Order:   Diet Order             Diet NPO time specified  Diet effective now                   EDUCATION NEEDS:   No education needs have been identified at this time  Skin:  Skin Assessment: Reviewed RN Assessment  Last BM:  10/9  Height:   Ht Readings from Last 1 Encounters:  07/16/23 5\' 3"  (1.6 m)    Weight:   Wt Readings from Last 1 Encounters:  08/08/23 78.9 kg    BMI:  Body mass index is 30.81 kg/m.  Estimated Nutritional Needs:   Kcal:  1650-1850  Protein:  85-100g  Fluid:  1.8L/day   Tilda Franco, MS, RD, LDN Inpatient Clinical Dietitian Contact information available via Amion

## 2023-08-08 NOTE — Anesthesia Procedure Notes (Signed)
Procedure Name: Intubation Date/Time: 08/08/2023 2:45 PM  Performed by: Ahmed Prima, CRNAPre-anesthesia Checklist: Patient identified, Emergency Drugs available, Suction available and Patient being monitored Patient Re-evaluated:Patient Re-evaluated prior to induction Oxygen Delivery Method: Circle system utilized Preoxygenation: Pre-oxygenation with 100% oxygen Induction Type: IV induction Laryngoscope Size: Mac, Glidescope and 3 Grade View: Grade I Tube type: Oral Tube size: 7.0 mm Number of attempts: 1 Airway Equipment and Method: Stylet, Oral airway and Video-laryngoscopy Placement Confirmation: ETT inserted through vocal cords under direct vision, positive ETCO2 and breath sounds checked- equal and bilateral Secured at: 23 cm Tube secured with: Tape Dental Injury: Teeth and Oropharynx as per pre-operative assessment

## 2023-08-08 NOTE — Progress Notes (Signed)
Patient continues to vomit blood. Hematemesis is now thicker with visible clots. Just spoke with endo regarding procedure.

## 2023-08-08 NOTE — TOC Progression Note (Signed)
Transition of Care [facility]) - Progression Note    Patient Details  Name: Sherri Gill MRN: [id number] Date of Birth: [birth year]  Transition of Care [facility]) CM/SW Contact  Lavenia Atlas, RN Phone Number: 08/08/2023, 6:51 PM  Clinical Narrative:   Per chart review  patient currently intubated at [facility] ICU for refactory ileus due to carcinomatosis.   Transition of Care [facility]) Department has reviewed patient and no TOC needs have been identified at this time. We will continue to monitor patient advancement through Interdisciplinary progressions and if new patient needs arise, please place a consult.      Expected Discharge Plan: Home/Self Care Barriers to Discharge: Continued Medical Work up  Expected Discharge Plan and Services In-house Referral: NA Discharge Planning Services: CM Consult Post Acute Care Choice: NA Living arrangements for the past 2 months: Apartment                 DME Arranged: N/A                     Social Determinants of Health (SDOH) Interventions SDOH Screenings   Food Insecurity: No Food Insecurity (07/14/2023)  Housing: Low Risk  (07/14/2023)  Transportation Needs: No Transportation Needs (07/14/2023)  Utilities: Not At Risk (07/14/2023)  Alcohol Screen: Low Risk  (02/20/2023)  Depression (PHQ2-9): Medium Risk (03/25/2023)  Financial Resource Strain: Medium Risk (02/20/2023)  Physical Activity: Inactive (02/20/2023)  Social Connections: Unknown (02/20/2023)  Stress: Stress Concern Present (02/20/2023)  Tobacco Use: Medium Risk (07/14/2023)    Readmission Risk Interventions    08/08/2023    6:50 PM 07/16/2023   11:28 AM  Readmission Risk Prevention Plan  Transportation Screening Complete Complete  PCP or Specialist Appt within 3-5 Days  Complete  HRI or Home Care Consult  Complete  Social Work Consult for Recovery Care Planning/Counseling  Complete  Palliative Care Screening  Not Applicable  Medication Review Oceanographer) Complete Referral  to Pharmacy  PCP or Specialist appointment within 3-5 days of discharge Complete   HRI or Home Care Consult Complete   SW Recovery Care/Counseling Consult Complete   Skilled Nursing Facility Not Applicable

## 2023-08-08 NOTE — Progress Notes (Signed)
Patient called this nurse into room, patient is vomiting up blood with noticeable clots. Md notified at this time, stating that he will notify IR

## 2023-08-08 NOTE — Consult Note (Signed)
NAME:  Sherri Gill, MRN:  [id number], DOB:  [birth year], LOS: 25 ADMISSION DATE:  06/29/2023, CONSULTATION DATE: 08/27/2023 REFERRING MD: Dr. Elnoria Howard, CHIEF COMPLAINT: Acute respiratory failure, shock  History of Present Illness:  72 year old woman with a history of recurrent stage IV uterine cancer with peritoneal carcinomatosis on chemotherapy per Dr. [name], atrial fibrillation on Xarelto, diabetes, hypertension.  She is admitted with refractory ileus due to her intra-abdominal tumor burden.  She has been on TPN, had a PEG placed on 10/3.  On 10/11 she started bleeding for the PEG tube insertion site and had hematemesis.  Xarelto held, last dose was evening 10/10.  She went urgently for EGD 10/11> oozing gastric ulcer with active bleeding at the site of PEG tube insertion, unable to achieve hemostasis with clips and Hemospray.  Return to the ICU intubated, sedated and on low-dose phenylephrine.  Pertinent  Medical History   Past Medical History:  Diagnosis Date   AKI (acute kidney injury) (HCC)    History of   Ascites    09/2021 paracentesis with almost 3 L of fluid 12/2021 Paracentesis performed with 2.8 L   Carpal tunnel syndrome 02/11/2014   Chronic anemia 08/29/2012   Need colonoscopy or report. Need iron panel.    Dyslipidemia 06/30/2007   Dyspnea    Essential hypertension, benign 06/30/2007   GERD 06/30/2007   History of blood transfusion    History of fatty infiltration of liver    History of migraine    Hx of Herpes simplex meningitis 2015   Also noted to have primary empty sella on imaging at this admission   Insomnia 06/11/2012   Major depressive disorder, recurrent episode, moderate with anxious distress (HCC) 06/30/2007   Nausea and vomiting 01/13/2022   Obesity, BMI 35-40 06/11/2012   Peripheral neuropathy 2/2 T2DM 12/28/2009   Primary empty sella syndrome (HCC) 2015   Noted on imaging during hospitalization for herpes meningitis; no pituitary mass, no hormone w/u at  that time, hormonally asymptomatic   Type 2 diabetes mellitus with neurological complications (HCC) 06/30/2007    Significant Hospital Events: Including procedures, antibiotic start and stop dates in addition to other pertinent events   EGD 10/11 >> actively bleeding gastric ulcer at PEG insertion site, unsuccessful clip and Hemospray.  To the ICU intubated and sedated  Interim History / Subjective:    Objective   Blood pressure (!) 84/47, pulse 95, temperature (!) 97.5 F (36.4 C), temperature source Temporal, resp. rate 16, height 5\' 3"  (1.6 m), weight 78.9 kg, SpO2 100%.    Vent Mode: PRVC FiO2 (%):  [100 %] 100 % Set Rate:  [16 bmp] 16 bmp Vt Set:  [420 mL] 420 mL PEEP:  [5 cmH20] 5 cmH20   Intake/Output Summary (Last 24 hours) at 08/23/2023 1631 Last data filed at 08/12/2023 1611 Gross per 24 hour  Intake 1687.04 ml  Output 101 ml  Net 1586.04 ml   Filed Weights   08/01/23 1701 08/03/23 0500 07/30/2023 0411  Weight: 79.7 kg 81.1 kg 78.9 kg    Examination: General: Ill-appearing woman, ventilated and sedated HENT: ET tube in position Lungs: Clear bilaterally Cardiovascular: Tachycardic, regular, no murmur Abdomen: PEG tube in place with clot underneath button Extremities: No edema Neuro: Sedated unresponsive GU: Foley  Resolved Hospital Problem list   Leukopenia Thrombocytopenia  Assessment & Plan:   Acute upper GI bleeding due to gastric ulcer at PEG site.  Hemostasis not achieved on EGD. -Surgical consultation obtained by GI, appreciate Dr.  Gerkins assistance.  Unclear whether she would require or is a good candidate for surgical intervention.  Our goal will be to achieve hemostasis and stabilize medically -[facility] ordered for Xarelto reversal -Xarelto on hold -N.p.o. and stop any TF -Follow serial CBC -Transfuse PRBC, goal hemoglobin 8.0 in setting of active bleeding  Refractory ileus.  Apparently has poorly tolerated clamping G-tube, has required venting and  TPN -Presume that given this issue she will continue to require TPN as per pharmacy recommendations  Uterine cancer with peritoneal carcinomatosis -Followed by Dr. [name] and has been receiving chemotherapy.  Anticipate that this will be on hold at least temporarily to achieve stability, particularly if she requires surgical intervention  Hypothyroidism -Continue levothyroxine  Paroxysmal atrial fibrillation -Hold her diltiazem and metoprolol given current hypotension -Xarelto on hold   Best Practice (right click and "Reselect all SmartList Selections" daily)   Diet/type: NPO DVT prophylaxis: SCD GI prophylaxis: PPI Lines: N/A Foley:  Yes, and it is still needed Code Status:  full code Last date of multidisciplinary goals of care discussion [pending]  Labs   CBC: Recent Labs  Lab 08/02/23 1330 08/04/23 0534 08/06/23 0559 08/01/2023 1216 08/21/2023 1512  WBC 2.8* 4.5 8.6  --   --   HGB 9.4* 9.0* 10.2* 9.1* 6.1*  HCT 29.4* 29.2* 32.5* 30.1* 18.0*  MCV 88.6 91.8 90.3  --   --   PLT 116* 116* 154  --   --     Basic Metabolic Panel: Recent Labs  Lab 08/04/23 0534 08/05/23 0545 08/06/23 0559 08/07/23 0604 07/31/2023 0601 08/25/2023 1512  NA 137 135 135 133* 130* 135  K 4.2 4.1 3.8 4.0 3.8 3.7  CL 100 99 105 104 99 107  CO2 27 25 21* 19* 21*  --   GLUCOSE 171* 132* 148* 138* 137* 193*  BUN 24* 20 18 27* 31* 34*  CREATININE 0.76 0.72 0.67 0.72 0.75 0.70  CALCIUM 9.2 9.3 9.3 9.3 9.4  --   MG 1.5* 1.6* 1.8 1.6* 1.5*  --   PHOS 3.5  --   --  3.5 3.8  --    GFR: Estimated Creatinine Clearance: 65.1 mL/min (by C-G formula based on SCr of 0.7 mg/dL). Recent Labs  Lab 08/02/23 1330 08/04/23 0534 08/06/23 0559  WBC 2.8* 4.5 8.6    Liver Function Tests: Recent Labs  Lab 08/04/23 0534 08/05/23 0545 08/06/23 0559 08/07/23 0604 08/27/2023 0601  AST 34 27 41 35 20  ALT 39 34 50* 56* 40  ALKPHOS 161* 172* 220* 239* 229*  BILITOT 0.5 0.4 0.6 0.4 0.4  PROT 6.1* 6.3*  7.0 7.0 7.1  ALBUMIN 2.3* 2.2* 2.5* 2.7* 2.6*   No results for input(s): "LIPASE", "AMYLASE" in the last 168 hours. No results for input(s): "AMMONIA" in the last 168 hours.  ABG    Component Value Date/Time   TCO2 18 (L) 08/07/2023 1512     Coagulation Profile: No results for input(s): "INR", "PROTIME" in the last 168 hours.  Cardiac Enzymes: No results for input(s): "CKTOTAL", "CKMB", "CKMBINDEX", "TROPONINI" in the last 168 hours.  HbA1C: Hemoglobin A1C  Date/Time Value Ref Range Status  02/20/2023 10:32 AM 6.4 (A) 4.0 - 5.6 % Final  08/22/2022 09:21 AM 6.0 (A) 4.0 - 5.6 % Final   Hgb A1c MFr Bld  Date/Time Value Ref Range Status  04/05/2022 01:30 PM 6.5 (H) 4.8 - 5.6 % Final    Comment:    (NOTE) Pre diabetes:  5.7%-6.4%  Diabetes:              >6.4%  Glycemic control for   <7.0% adults with diabetes   12/29/2021 05:40 AM 6.7 (H) 4.8 - 5.6 % Final    Comment:    (NOTE) Pre diabetes:          5.7%-6.4%  Diabetes:              >6.4%  Glycemic control for   <7.0% adults with diabetes     CBG: Recent Labs  Lab 08/07/23 2012 09-05-2023 0025 2023/09/05 0414 Sep 05, 2023 0745 09/05/23 1129  GLUCAP 147* 123* 128* 119* 144*    Review of Systems:   Unable to obtain  Past Medical History:  She,  has a past medical history of AKI (acute kidney injury) (HCC), Ascites, Carpal tunnel syndrome (02/11/2014), Chronic anemia (08/29/2012), Dyslipidemia (06/30/2007), Dyspnea, Essential hypertension, benign (06/30/2007), GERD (06/30/2007), History of blood transfusion, History of fatty infiltration of liver, History of migraine, Hx of Herpes simplex meningitis (2015), Insomnia (06/11/2012), Major depressive disorder, recurrent episode, moderate with anxious distress (HCC) (06/30/2007), Nausea and vomiting (01/13/2022), Obesity, BMI 35-40 (06/11/2012), Peripheral neuropathy 2/2 T2DM (12/28/2009), Primary empty sella syndrome (HCC) (2015), and Type 2 diabetes mellitus with  neurological complications (HCC) (06/30/2007).   Surgical History:   Past Surgical History:  Procedure Laterality Date   BIOPSY  06/30/2023   Procedure: BIOPSY;  Surgeon: Charna Elizabeth, MD;  Location: [facility];  Service: Gastroenterology;;   COLONOSCOPY W/ POLYPECTOMY  06/28/2004   DENTAL SURGERY     DILATATION & CURETTAGE/HYSTEROSCOPY WITH MYOSURE N/A 01/15/2022   Procedure: DILATATION & CURETTAGE/HYSTEROSCOPY WITH MYOSURE;  Surgeon: Carver Fila, MD;  Location: [facility];  Service: Gynecology;  Laterality: N/A;  DO NOT OPEN HYSTEROSCOPY KIT   ENTEROSCOPY N/A 07/25/2023   Procedure: ENTEROSCOPY;  Surgeon: Charna Elizabeth, MD;  Location: [facility];  Service: Gastroenterology;  Laterality: N/A;   HYSTERECTOMY ABDOMINAL WITH SALPINGO-OOPHORECTOMY Bilateral 04/09/2022   Procedure: HYSTERECTOMY ABDOMINAL BILATERAL SALPINGO OOPHORECTOMY WITH OMENTECTOMY ,DEBULKING;  Surgeon: Carver Fila, MD;  Location: [facility];  Service: Gynecology;  Laterality: Bilateral;   IR GASTROSTOMY TUBE MOD SED  07/31/2023   IR IMAGING GUIDED PORT INSERTION  01/18/2022   LAPAROSCOPY N/A 04/09/2022   Procedure: LAPAROSCOPY DIAGNOSTIC;  Surgeon: Carver Fila, MD;  Location: [facility];  Service: Gynecology;  Laterality: N/A;   OPERATIVE ULTRASOUND N/A 01/15/2022   Procedure: OPERATIVE ULTRASOUND;  Surgeon: Carver Fila, MD;  Location: [facility];  Service: Gynecology;  Laterality: N/A;   port a cath placement     TUBAL LIGATION     age 18     Social History:   reports that she quit smoking about 44 years ago. Her smoking use included cigarettes. She started smoking about 54 years ago. She has a 2.5 pack-year smoking history. She has never used smokeless tobacco. She reports that she does not drink alcohol and does not use drugs.   Family History:  Her family history includes Breast cancer in her sister and another family member; Dementia in her mother; Liver cancer in her brother; Prostate cancer in her  cousin and maternal uncle; Thyroid cancer in her maternal aunt. There is no history of Ovarian cancer, Colon cancer, Endometrial cancer, or Pancreatic cancer. She was adopted.   Allergies Allergies  Allergen Reactions   Codeine Sulfate Nausea And Vomiting and Other (See Comments)    Dizziness   Penicillins Nausea And Vomiting   Percocet [Oxycodone-Acetaminophen] Nausea  And Vomiting   Pork-Derived Products     Patietn states : "I am not allergic - I don't eat Pork products because of my Muslim beliefs"   Zestril [Lisinopril] Cough     Home Medications  Prior to Admission medications   Medication Sig Start Date End Date Taking? Authorizing Provider  acetaminophen (TYLENOL) 500 MG tablet Take 1,000 mg by mouth every 6 (six) hours as needed for mild pain.   Yes [provider]  albuterol (VENTOLIN HFA) 108 (90 Base) MCG/ACT inhaler Inhale 2 puffs into the lungs every 4 (four) hours as needed for wheezing or shortness of breath. 12/31/21  Yes Glade Lloyd, MD  Biotin 1000 MCG CHEW Chew 1,000 mcg by mouth daily.   Yes [provider]  Cholecalciferol (VITAMIN D3) 25 MCG (1000 UT) CAPS Take 1,000 Units by mouth daily with breakfast.   Yes [provider]  diltiazem (CARDIZEM CD) 120 MG 24 hr capsule Take 1 capsule (120 mg total) by mouth daily. 02/20/23  Yes [name], MD  dorzolamide-timolol (COSOPT) 22.3-6.8 MG/ML ophthalmic solution Place 1 drop into both eyes in the morning and at bedtime. 12/15/21  Yes [provider]  hydrALAZINE (APRESOLINE) 25 MG tablet TAKE 1 TABLET BY MOUTH THREE TIMES DAILY 01/03/23  Yes [name], MD  HYDROmorphone (DILAUDID) 2 MG tablet Take 1 tablet (2 mg) by mouth 3 times daily as needed for severe pain. 06/20/23  Yes Gorsuch, Ni, MD  latanoprost (XALATAN) 0.005 % ophthalmic solution Place 1 drop into both eyes at bedtime. 12/15/21  Yes [provider]  lidocaine-prilocaine (EMLA) cream Apply to affected  area once as directed Patient taking differently: Apply 1 Application topically See admin instructions. Apply to affected area as directed for port access 01/31/23  Yes Gorsuch, Ni, MD  losartan (COZAAR) 100 MG tablet Take 1 tablet (100 mg total) by mouth at bedtime. 02/17/23 02/12/24 Yes [name], MD  magnesium oxide (MAG-OX) 400 (240 Mg) MG tablet Take 1 tablet (400 mg total) by mouth 2 (two) times daily. 03/21/23  Yes Gorsuch, Ni, MD  metFORMIN (GLUCOPHAGE) 1000 MG tablet Take 1 tablet (1,000 mg total) by mouth 2 (two) times daily. 08/28/22 08/28/23 Yes Atway, Rayann N, DO  methylPREDNISolone (MEDROL DOSEPAK) 4 MG TBPK tablet Take 6 tablets by mouth on day 1 then decrease by 1 tablet daily until finished (6-5-4-3-2-1) Patient taking differently: Take 4-24 mg by mouth See admin instructions. Take 24 mg (6 tablets) by mouth on day 1 then decrease by 4 mg daily (1 tablet) daily until finished (6-5-4-3-2-1) 07/07/23  Yes Walisiewicz, Kaitlyn E, PA-C  ondansetron (ZOFRAN) 8 MG tablet Take 1 tablet (8 mg total) by mouth every 8 (eight) hours as needed for nausea or vomiting. Start on the third day after chemotherapy. 01/31/23  Yes Gorsuch, Ni, MD  pantoprazole (PROTONIX) 40 MG tablet Take 1 tablet by mouth daily. Patient taking differently: Take 40 mg by mouth daily before breakfast. 02/20/23 02/15/24 Yes [name], MD  polyethylene glycol powder (GLYCOLAX/MIRALAX) 17 GM/SCOOP powder Take 17 g by mouth daily. 03/28/22  Yes Cross, Melissa D, NP  prochlorperazine (COMPAZINE) 10 MG tablet Take 1 tablet (10 mg total) by mouth every 6 (six) hours as needed for nausea or vomiting. 01/31/23  Yes Artis Delay, MD  promethazine (PHENERGAN) 25 MG suppository Place 1 suppository (25 mg total) rectally every 6 (six) hours as needed for up to 12 days for nausea or vomiting. 07/12/23 07/24/23 Yes Young,  Harmon Dun, DO  rivaroxaban (XARELTO) 20 MG TABS tablet TAKE 1 TABLET BY MOUTH ONCE DAILY WITH SUPPER 06/20/23  Yes  [name], MD  TUMS 500 MG chewable tablet Chew 1 tablet by mouth 3 (three) times daily as needed for indigestion or heartburn.   Yes [provider]  atorvastatin (LIPITOR) 20 MG tablet Take 1 tablet (20 mg total) by mouth at bedtime. Patient not taking: Reported on 07/07/2023 08/28/22 08/28/23  Atway, Rayann N, DO  Blood Glucose Monitoring Suppl (ONE TOUCH ULTRA MINI) w/Device KIT Please use as directed. 12/20/16   Beather Arbour, MD  Cholecalciferol 1000 units capsule Take 1 capsule (1,000 Units total) by mouth daily. Patient not taking: Reported on 07/18/2023 11/08/16   Valentino Nose, MD  glucose blood (ONE TOUCH ULTRA TEST) test strip Use as instructed 12/20/16   Beather Arbour, MD  Insulin Pen Needle 32G X 4 MM MISC 1 each by Does not apply route daily. 09/01/19   Lorenso Courier, MD  levothyroxine (SYNTHROID) 50 MCG tablet Take 1 tablet (50 mcg total) by mouth daily before breakfast. Patient not taking: Reported on 07/11/2023 06/20/23   Artis Delay, MD  [facility] DELICA LANCETS 33G MISC Please use as directed. 12/20/16   Beather Arbour, MD     Critical care time: 6 min      Levy Pupa, MD, PhD 08/13/2023, 4:31 PM [facility] [phone] or if no answer before 7:00PM call [phone] For any issues after 7:00PM please call eLink [phone]

## 2023-08-08 NOTE — Progress Notes (Addendum)
eLink Physician-Brief Progress Note Patient Name: Sherri Gill DOB: Mar 11, 1951 MRN: [id number]   Date of Service  08/08/2023  HPI/Events of Note  Pt is febrile. 103.8 bladder, 101.3 axillary. Temp has jumped from 99 to this in an hour. Blood transfusion reaction??? She was cold initially in Endo, was given 3 units of blood rapidly through warmer. Pt also aspirated in Endo.  Camera: Discussed with RN. Spiking fever, suspected transfusion related  reaction vs new sepsis, asking for any antibiotics/Rx.  Off of pressors. MAP > 65, sinus tachy at 116. 100% sats, on Vent, in synchrony. Aspirated in EGD.   eICU Interventions  Meeting SIRS criteria, in hospital, immunosuppressed from Metastatic uterine cancer, last chemo on 9/25.   Sepsis: CxR > developing RLL air space density, ET in place. Wbc was normal from 9th. - get stat LA, proc calcitonin, CBC, blood culture x 2, ABG, BMP. EF from 2023-63%. Not seeing any CHF picture on CxR.  - EGDT, but will give only 1 lit fluids for now. -Empirically will start Vanc/cefepime.  -Tylenol       Intervention Category Major Interventions: Other:;Sepsis - evaluation and management  Ranee Gosselin 08/08/2023, 9:05 PM  02:24 AM K+ 3.2 (Was 4.0 @ 2327) Mag 1.5  UA neg, LA still normal, improved further down.  Has PIV  On TPN  - replacement ordered - de escalate antibiotics if no growth on cultures.

## 2023-08-08 NOTE — Progress Notes (Signed)
Patient ID: Sherri Gill, female   DOB: 1951/09/16, 72 y.o.   MRN: [id number] Asked to see patient due to bleeding at G-tube insertion site, placed on 07/31/23. On exam there is visible clot underneath G-tube disc.  Site was cleansed and small amount of bleeding noted.  Patient evaluated 1 hour later with no brisk bleeding at site.  G-tube is appropriately cinched to skin surface and 2T tacks remain in place.  A hemostatic pad was placed underneath disc.  T tacks should fall off on their own within the next week, patient also noted to have nausea/vomiting of blood this morning.  Case discussed with Dr. Archer Asa and recommend GI consult.  Above discussed with Dr. Chesley Mires pending.

## 2023-08-08 NOTE — Progress Notes (Signed)
Sherri Gill   DOB:04-Feb-1951   ZO#:[id number]    ASSESSMENT & PLAN:   Recurrent uterine cancer She presents with partial small bowel obstruction She tolerated chemotherapy with carboplatin and paclitaxel 07/23/2023 In the meantime, continue supportive care, next cycle of chemotherapy is due next week The patient is quite upset when she found out she have stage IV disease I redirected her focus.  Rather than focusing on the stage of the disease, the big picture is whether she would respond to the treatment or not.  I plan to repeat imaging study after cycle 2 of treatment and if she does not respond to the chemotherapy, then her condition will be considered terminal I addressed all her questions   Nausea, vomiting and dehydration, with persistent ileus She had venting gastrostomy tube placement on 10/3 She had another episode of vomiting yesterday I prefer not to put her gastrostomy tube on drainage Continue close observation If the patient experience another episode of recurrent nausea vomiting again, we will put her on on n.p.o.   Acquired pancytopenia Due to chemotherapy, she had received 1 unit of blood last weekend on October 4 Monitor closely   Dehydration, mild hypokalemia and other electrolyte imbalance, severe malnutrition, improving Anticipate prolonged ileus and the patient is already malnourished She is able to tolerate some liquid diet but overall, according to her calorie counts, her oral intake is negligible  if she is able to eat more than 50% of required calories by mouth, then she can be discharged soon   Discharge planning Unknown, will follow All questions were answered. The patient knows to call the clinic with any problems, questions or concerns.   The total time spent in the appointment was 40 minutes encounter with patients including review of chart and various tests results, discussions about plan of care and coordination of care plan  Artis Delay,  MD 08/23/2023 8:06 AM  Subjective:  She has another episode of vomiting yesterday.  We discussed stage of disease, current state of her condition, the terminal nature of her disease as well as plan for future treatment  Objective:  Vitals:   08/07/23 2009 07/31/2023 0411  BP: 133/65 124/69  Pulse: 73 76  Resp: 14 14  Temp: 98.3 F (36.8 C) 98 F (36.7 C)  SpO2: 98% 98%     Intake/Output Summary (Last 24 hours) at 08/03/2023 0806 Last data filed at 08/07/2023 0655 Gross per 24 hour  Intake 694.04 ml  Output 101 ml  Net 593.04 ml    GENERAL:alert, no distress and comfortable  Labs:  Recent Labs    08/06/23 0559 08/07/23 0604 08/22/2023 0601  NA 135 133* 130*  K 3.8 4.0 3.8  CL 105 104 99  CO2 21* 19* 21*  GLUCOSE 148* 138* 137*  BUN 18 27* 31*  CREATININE 0.67 0.72 0.75  CALCIUM 9.3 9.3 9.4  GFRNONAA >60 >60 >60  PROT 7.0 7.0 7.1  ALBUMIN 2.5* 2.7* 2.6*  AST 41 35 20  ALT 50* 56* 40  ALKPHOS 220* 239* 229*  BILITOT 0.6 0.4 0.4    Studies:  IR GASTROSTOMY TUBE MOD SED  Result Date: 07/31/2023 INDICATION: 72 year old female with history of progressive gynecologic malignancy and bowel obstruction requiring percutaneous gastrostomy access for venting purposes. EXAM: PERC PLACEMENT GASTROSTOMY MEDICATIONS: Ancef 2 gm IV; Antibiotics were administered within 1 hour of the procedure. ANESTHESIA/SEDATION: Versed 2 mg IV; Fentanyl 100 mcg IV Moderate Sedation Time:  12 The patient was continuously monitored during the procedure  by the interventional radiology nurse under my direct supervision. CONTRAST:  20mL OMNIPAQUE IOHEXOL 300 MG/ML SOLN - administered into the gastric lumen. FLUOROSCOPY TIME:  Ten mGy COMPLICATIONS: None immediate. PROCEDURE: Informed written consent was obtained from the patient after a thorough discussion of the procedural risks, benefits and alternatives. All questions were addressed. Maximal Sterile barrier Technique was utilized including caps, mask,  sterile gowns, sterile gloves, sterile drape, hand hygiene and skin antiseptic. A timeout was performed prior to the initiation of the procedure. The patient was placed on the procedure table in the supine position. Pre-procedure abdominal film confirmed visualization of the transverse colon. The patient was prepped and draped in usual sterile fashion. The stomach was insufflated with air via the indwelling nasogastric tube. Under fluoroscopy, a puncture site was selected and local analgesia achieved with 1% lidocaine infiltrated subcutaneously. Under fluoroscopic guidance, a gastropexy needle was passed into the stomach and the T-bar suture was released. Entry into the stomach was confirmed with fluoroscopy, aspiration of air, and injection of contrast material. This was repeated with an additional gastropexy suture (for a total of 2 fasteners). At the center of these gastropexy sutures, a dermatotomy was performed. An 18 gauge needle was passed into the stomach at the site of this dermatotomy, and position within the gastric lumen again confirmed under fluoroscopy using aspiration of air and contrast injection. An Amplatz guidewire was passed through this needle and intraluminal placement within the stomach was confirmed by fluoroscopy. The needle was removed. Over the guidewire, the percutaneous tract was dilated using a 10 mm non-compliant balloon. The balloon was deflated, then pushed into the gastric lumen followed in concert by the 20 Fr gastrostomy tube. The retention balloon of the percutaneous gastrostomy tube was inflated with 10 mL of sterile water. The tube was withdrawn until the retention balloon was at the edge of the gastric lumen. The external bumper was brought to the abdominal wall. Contrast was injected through the gastrostomy tube, confirming intraluminal positioning. The patient tolerated the procedure well without any immediate post-procedural complications. IMPRESSION: Technically successful  placement of 20 Fr gastrostomy tube. PLAN: Plan for routine 6 month gastrostomy tube exchange. Marliss Coots, MD [facility] Radiology Electronically Signed   By: Marliss Coots M.D.   On: 07/31/2023 11:26   DG Chest 2 View  Result Date: 07/30/2023 CLINICAL DATA:  Hospital-acquired pneumonia. EXAM: CHEST - 2 VIEW COMPARISON:  Chest/abdominal radiographs 07/08/2023 FINDINGS: A right jugular Port-A-Cath remains in place with tip near the superior cavoatrial junction. An enteric tube courses into the abdomen with tip not imaged and with side port near the GE junction. The cardiomediastinal silhouette is unchanged with normal heart size. Aortic atherosclerosis is noted. The lungs are hypoinflated. No confluent airspace opacity, edema, pleural effusion, or pneumothorax is identified. No acute osseous abnormality is seen. IMPRESSION: No active cardiopulmonary disease. Electronically Signed   By: Sebastian Ache M.D.   On: 07/30/2023 13:44   DG Abd 1 View  Result Date: 07/30/2023 CLINICAL DATA:  Nasogastric tube placement EXAM: ABDOMEN - 1 VIEW COMPARISON:  07/27/2023 FINDINGS: Enteric tube with tip at the stomach which appears decompressed. No gas dilated bowel is seen, there is partial coverage of the right abdomen. Rounded densities over the left flank are within colonic diverticula based on prior CT. IMPRESSION: Enteric tube with tip at the stomach. Electronically Signed   By: Tiburcio Pea M.D.   On: 07/30/2023 04:36   DG Abd 2 Views  Result  Date: 07/27/2023 CLINICAL DATA:  Abdominal pain EXAM: ABDOMEN - 2 VIEW COMPARISON:  07/22/2023 FINDINGS: Gastric catheter is again seen in the stomach. No free air is noted. Right chest wall port is seen. Contrast material is noted in multiple colonic diverticula. A few mildly prominent loops of small bowel are noted in the central abdomen. This is somewhat increased when compare with the prior CT of 07/12/2023. Bony  abnormality is noted. IMPRESSION: Slight increase in central small bowel dilatation. CT may be helpful for further evaluation. Electronically Signed   By: Alcide Clever M.D.   On: 07/27/2023 20:13   DG Abd 1 View  Result Date: 07/22/2023 CLINICAL DATA:  [id number] Encounter for nasogastric (NG) tube placement [id number]. EXAM: ABDOMEN - 1 VIEW COMPARISON:  Abdominal radiograph 07/19/2023. FINDINGS: Right chest Port-A-Cath tip projects over the right atrium. Enteric tube tip and side port project over the stomach. Scant retained contrast in visualized portions of the colon. IMPRESSION: Enteric tube tip and side port project over the stomach. Electronically Signed   By: Orvan Falconer M.D.   On: 07/22/2023 14:27   DG Abd 1 View  Result Date: 07/19/2023 CLINICAL DATA:  [id number] SBO (small bowel obstruction) (HCC) [id number] EXAM: ABDOMEN - 1 VIEW COMPARISON:  July 18, 2023 FINDINGS: Enteric contrast has progressed to the rectum. Multiple colonic diverticuli. No dilated loops of bowel are seen. IMPRESSION: Enteric contrast has progressed to the rectum. Electronically Signed   By: Meda Klinefelter M.D.   On: 07/19/2023 10:22   DG UGI W SINGLE CM (SOL OR THIN BA)  Result Date: 07/18/2023 CLINICAL DATA:  Endometrial/uterine cancer. Fold thickening in the stomach on recent endoscopy. Reflux and bloating. Difficulty keeping foods and liquids down. EXAM: UPPER GI SERIES WITH KUB TECHNIQUE: After obtaining a scout radiograph a routine upper GI series was performed using thin barium. Mobility was mildly limited on today's exam, for example the patient was unable to turn prone. FLUOROSCOPY: Radiation Exposure Index (as provided by the fluoroscopic device): 21.7 mGy Kerma COMPARISON:  CT abdomen 07/12/2023 FINDINGS: Initial KUB unremarkable aside from mild levoconvex lumbar scoliosis. The pharyngeal phase of swallowing appears normal. Primary peristaltic waves were intact on 4/4 swallows. Small type 1 hiatal hernia.  Occasional gastroesophageal reflux was observed on today's exam. I used mucosal relief and balloon compression of the stomach in order to help mitigate the use of thin barium and single contrast technique as well as the patient is inability to perform prone imaging. These demonstrate a gastric fold thickening as shown on prior CT and prior endoscopy. The duodenum is mildly dilated, especially proximally. Eventually this proceeded on to the fourth portion of the duodenum as shown on image 1 series 15. However, the contrast column was sluggish to flow through the duodenum, and given the somewhat dilated appearance, the possibility of obstruction distally is not excluded. Consider following the barium column with serial KUB use in assessing for small bowel obstruction in several hours. A 13 mm barium tablet passed briskly into the stomach. Port-A-Cath noted. IMPRESSION: 1. Gastric fold thickening as shown on prior CT and endoscopy. 2. Small type 1 hiatal hernia. 3. Intermittent gastroesophageal reflux. 4. Mildly dilated duodenum, with sluggish flow of contrast through the duodenum. The possibility of a more distal small bowel obstruction is not excluded given this appearance. Consider KUB follow-up in several hours to assess for transit of the barium column through the small bowel in assessing for small bowel obstruction. Electronically Signed   By: Gaylyn Rong  M.D.   On: 07/18/2023 12:00   DG Abd Portable 1 View  Result Date: 07/12/2023 CLINICAL DATA:  Nasogastric tube placement. EXAM: PORTABLE ABDOMEN - 1 VIEW COMPARISON:  Abdomen and pelvis CT dated 07/12/2023. FINDINGS: Normal bowel-gas pattern. Interval nasogastric tube with its tip and side hole in the proximal to mid stomach. Lower thoracic spine degenerative changes. IMPRESSION: Nasogastric tube tip and side hole in the proximal to mid stomach. Electronically Signed   By: Beckie Salts M.D.   On: 07/23/2023 17:09   DG Abdomen Acute W/Chest  Result  Date: 07/28/2023 CLINICAL DATA:  Vomiting EXAM: DG ABDOMEN ACUTE WITH 1 VIEW CHEST COMPARISON:  07/10/2023. FINDINGS: There is no evidence of dilated bowel loops or free intraperitoneal air. No radiopaque calculi or other significant radiographic abnormality is seen. Heart size and mediastinal contours are within normal limits. Both lungs are clear. There are thoracic degenerative changes. Right-sided Port-A-Cath tip overlies distal SVC. IMPRESSION: Negative abdominal radiographs.  No acute cardiopulmonary disease. Electronically Signed   By: Layla Maw M.D.   On: 07/12/2023 14:40   CT ABDOMEN PELVIS W CONTRAST  Result Date: 07/12/2023 CLINICAL DATA:  Endometrial/uterine cancer.  Nausea and vomiting. * Tracking Code: BO * EXAM: CT ABDOMEN AND PELVIS WITH CONTRAST TECHNIQUE: Multidetector CT imaging of the abdomen and pelvis was performed using the standard protocol following bolus administration of intravenous contrast. RADIATION DOSE REDUCTION: This exam was performed according to the departmental dose-optimization program which includes automated exposure control, adjustment of the mA and/or kV according to patient size and/or use of iterative reconstruction technique. CONTRAST:  OMNIPAQUE IOHEXOL 300 MG/ML  SOLN COMPARISON:  CT abdomen/pelvis from 06/17/2023 FINDINGS: Lower chest: A catheter terminates in the right atrium on the top most image. Mild cardiomegaly. Hepatobiliary: Unremarkable Pancreas: Unremarkable Spleen: Unremarkable Adrenals/Urinary Tract: Unremarkable Stomach/Bowel: Diffuse gastric wall thickening with mild gastric and substantial proximal duodenal distension. After crossing the midline, the duodenum does not appear distended but there is substantial distal duodenal and jejunal wall thickening, worsened from prior exams. Scattered colonic diverticula. Appendix unremarkable. No pneumatosis. No hypoenhancement of bowel wall. There are abnormal contains small fluid collections  along the small bowel mesentery, for example along the left abdomen on image 64 series 7 and along the right abdomen on image 55 series 7. In addition there is fluid density in the lesser sac on image 76 series 7. Vascular/Lymphatic: Mild atheromatous vascular disease of the aortoiliac tree. Patent celiac trunk and SMA. Patent superior mesenteric vein. Suspected left iliac adenopathy measuring about 1.3 cm in diameter on image 47 series 2, stable. This is of similar density to the adjacent vein. Reproductive: Uterus absent. Nodular enhancement along the left vaginal cuff measuring 2.5 by 1.7 cm, previously 2.3 by 1.7 cm by my measurements. Tumor in this location is not excluded. Other: There is some abnormal stranding along the remaining omentum for example on image 73 series 9. Small nodular deposit along the upper omentum on image 31 series 2. Musculoskeletal: Lower thoracic and lumbar spondylosis. Density within the umbilicus on image 102 series 9, potentially a small knuckle of bowel versus tumor. IMPRESSION: 1. Worsening of gastric wall thickening, distal duodenal wall thickening, and jejunal wall thickening. The stomach and proximal duodenum are distended. The appearance favors gastroenteritis. 2. There is also some abnormal stranding along the remaining omentum. There are also abnormal fluid collections along the small bowel mesentery and in the lesser sac. The appearance raises concern for possible residual peritoneal tumor causing fluid deposits.  Small nodular deposit along the upper omentum along with nodularity in the umbilicus. 3. Nodular enhancement along the left vaginal cuff measuring 2.5 by 1.7 cm, previously 2.3 by 1.7 cm by my measurements. This likely represents tumor. 4. Suspected left iliac adenopathy measuring about 1.3 cm in diameter, stable. 5. Mild cardiomegaly. 6. Scattered colonic diverticula. 7. Mild atheromatous vascular disease of the aortoiliac tree. 8. Lower thoracic and lumbar  spondylosis. Electronically Signed   By: Gaylyn Rong M.D.   On: 07/12/2023 14:56   DG Abd 1 View  Result Date: 07/10/2023 CLINICAL DATA:  Abdominal pain, vomiting. History of uterine cancer. EXAM: ABDOMEN - 1 VIEW COMPARISON:  April 09, 2022. FINDINGS: The bowel gas pattern is normal. Surgical clip is seen over the left sacrum. No radio-opaque calculi or other significant radiographic abnormality are seen. IMPRESSION: No abnormal bowel dilatation. Electronically Signed   By: Lupita Raider M.D.   On: 07/10/2023 14:18

## 2023-08-08 NOTE — Plan of Care (Signed)
  Problem: Education: Goal: Knowledge of General Education information will improve Description: Including pain rating scale, medication(s)/side effects and non-pharmacologic comfort measures Outcome: Not Progressing   Problem: Clinical Measurements: Goal: Diagnostic test results will improve Outcome: Not Progressing   Problem: Nutrition: Goal: Adequate nutrition will be maintained Outcome: Not Progressing

## 2023-08-08 NOTE — Interval H&P Note (Signed)
History and Physical Interval Note:  08/08/2023 2:05 PM  Sherri Gill  has presented today for surgery, with the diagnosis of GI bleed.  The various methods of treatment have been discussed with the patient and family. After consideration of risks, benefits and other options for treatment, the patient has consented to  Procedure(s): ESOPHAGOGASTRODUODENOSCOPY (EGD) WITH PROPOFOL (N/A) as a surgical intervention.  The patient's history has been reviewed, patient examined, no change in status, stable for surgery.  I have reviewed the patient's chart and labs.  Questions were answered to the patient's satisfaction.     Lorilyn Laitinen D

## 2023-08-09 ENCOUNTER — Inpatient Hospital Stay (HOSPITAL_COMMUNITY): Payer: Medicare HMO

## 2023-08-09 DIAGNOSIS — K9420 Gastrostomy complication, unspecified: Secondary | ICD-10-CM | POA: Diagnosis not present

## 2023-08-09 DIAGNOSIS — K567 Ileus, unspecified: Secondary | ICD-10-CM | POA: Diagnosis not present

## 2023-08-09 LAB — CBC WITH DIFFERENTIAL/PLATELET
Abs Immature Granulocytes: 0.4 K/uL — ABNORMAL HIGH (ref 0.00–0.07)
Basophils Absolute: 0.3 K/uL — ABNORMAL HIGH (ref 0.0–0.1)
Basophils Relative: 4 %
Eosinophils Absolute: 0 K/uL (ref 0.0–0.5)
Eosinophils Relative: 0 %
HCT: 30.3 % — ABNORMAL LOW (ref 36.0–46.0)
Hemoglobin: 9.7 g/dL — ABNORMAL LOW (ref 12.0–15.0)
Lymphocytes Relative: 5 %
Lymphs Abs: 0.4 K/uL — ABNORMAL LOW (ref 0.7–4.0)
MCH: 30.4 pg (ref 26.0–34.0)
MCHC: 32 g/dL (ref 30.0–36.0)
MCV: 95 fL (ref 80.0–100.0)
Metamyelocytes Relative: 4 %
Monocytes Absolute: 0.3 K/uL (ref 0.1–1.0)
Monocytes Relative: 3 %
Myelocytes: 1 %
Neutro Abs: 7.1 K/uL (ref 1.7–7.7)
Neutrophils Relative %: 83 %
Platelets: 102 K/uL — ABNORMAL LOW (ref 150–400)
RBC: 3.19 MIL/uL — ABNORMAL LOW (ref 3.87–5.11)
RDW: 15.9 % — ABNORMAL HIGH (ref 11.5–15.5)
WBC: 8.5 K/uL (ref 4.0–10.5)
nRBC: 0 % (ref 0.0–0.2)

## 2023-08-09 LAB — HEMOGLOBIN AND HEMATOCRIT, BLOOD
HCT: 31.2 % — ABNORMAL LOW (ref 36.0–46.0)
HCT: 30.2 % — ABNORMAL LOW (ref 36.0–46.0)
Hemoglobin: 10.3 g/dL — ABNORMAL LOW (ref 12.0–15.0)
Hemoglobin: 9.6 g/dL — ABNORMAL LOW (ref 12.0–15.0)

## 2023-08-09 LAB — GLUCOSE, CAPILLARY
Glucose-Capillary: 124 mg/dL — ABNORMAL HIGH (ref 70–99)
Glucose-Capillary: 130 mg/dL — ABNORMAL HIGH (ref 70–99)
Glucose-Capillary: 148 mg/dL — ABNORMAL HIGH (ref 70–99)
Glucose-Capillary: 159 mg/dL — ABNORMAL HIGH (ref 70–99)
Glucose-Capillary: 193 mg/dL — ABNORMAL HIGH (ref 70–99)
Glucose-Capillary: 222 mg/dL — ABNORMAL HIGH (ref 70–99)

## 2023-08-09 LAB — COMPREHENSIVE METABOLIC PANEL WITH GFR
ALT: 20 U/L (ref 0–44)
AST: 14 U/L — ABNORMAL LOW (ref 15–41)
Albumin: 2.7 g/dL — ABNORMAL LOW (ref 3.5–5.0)
Alkaline Phosphatase: 112 U/L (ref 38–126)
Anion gap: 9 (ref 5–15)
BUN: 38 mg/dL — ABNORMAL HIGH (ref 8–23)
CO2: 15 mmol/L — ABNORMAL LOW (ref 22–32)
Calcium: 7.7 mg/dL — ABNORMAL LOW (ref 8.9–10.3)
Chloride: 111 mmol/L (ref 98–111)
Creatinine, Ser: 0.78 mg/dL (ref 0.44–1.00)
GFR, Estimated: >60 mL/min (ref >60)
Glucose, Bld: 155 mg/dL — ABNORMAL HIGH (ref 70–99)
Potassium: 4 mmol/L (ref 3.5–5.1)
Sodium: 135 mmol/L (ref 135–145)
Total Bilirubin: 1.2 mg/dL (ref 0.3–1.2)
Total Protein: 5 g/dL — ABNORMAL LOW (ref 6.5–8.1)

## 2023-08-09 LAB — COMPREHENSIVE METABOLIC PANEL
ALT: 18 U/L (ref 0–44)
AST: 13 U/L — ABNORMAL LOW (ref 15–41)
Albumin: 2.6 g/dL — ABNORMAL LOW (ref 3.5–5.0)
Alkaline Phosphatase: 104 U/L (ref 38–126)
Anion gap: 8 (ref 5–15)
BUN: 43 mg/dL — ABNORMAL HIGH (ref 8–23)
CO2: 14 mmol/L — ABNORMAL LOW (ref 22–32)
Calcium: 7.7 mg/dL — ABNORMAL LOW (ref 8.9–10.3)
Chloride: 110 mmol/L (ref 98–111)
Creatinine, Ser: 0.82 mg/dL (ref 0.44–1.00)
GFR, Estimated: >60 mL/min (ref >60)
Glucose, Bld: 199 mg/dL — ABNORMAL HIGH (ref 70–99)
Potassium: 3.2 mmol/L — ABNORMAL LOW (ref 3.5–5.1)
Sodium: 132 mmol/L — ABNORMAL LOW (ref 135–145)
Total Bilirubin: 1.4 mg/dL — ABNORMAL HIGH (ref 0.3–1.2)
Total Protein: 5 g/dL — ABNORMAL LOW (ref 6.5–8.1)

## 2023-08-09 LAB — LACTIC ACID, PLASMA
Lactic Acid, Venous: 1.8 mmol/L (ref 0.5–1.9)
Lactic Acid, Venous: 1.1 mmol/L (ref 0.5–1.9)

## 2023-08-09 LAB — URINALYSIS, ROUTINE W REFLEX MICROSCOPIC
Bilirubin Urine: NEGATIVE
Glucose, UA: NEGATIVE mg/dL
Hgb urine dipstick: NEGATIVE
Ketones, ur: NEGATIVE mg/dL
Leukocytes,Ua: NEGATIVE
Nitrite: NEGATIVE
Protein, ur: NEGATIVE mg/dL
Specific Gravity, Urine: 1.018 (ref 1.005–1.030)
pH: 5 (ref 5.0–8.0)

## 2023-08-09 LAB — TRIGLYCERIDES: Triglycerides: 234 mg/dL — ABNORMAL HIGH (ref <150)

## 2023-08-09 LAB — PROCALCITONIN: Procalcitonin: 1.85 ng/mL

## 2023-08-09 LAB — MAGNESIUM: Magnesium: 1.5 mg/dL — ABNORMAL LOW (ref 1.7–2.4)

## 2023-08-09 MED ORDER — ORAL CARE MOUTH RINSE
15.0000 mL | OROMUCOSAL | Status: DC
  Administered 2023-08-09 – 2023-08-10 (×12): 15 mL via OROMUCOSAL

## 2023-08-09 MED ORDER — TRACE MINERALS CU-MN-SE-ZN 300-55-60-3000 MCG/ML IV SOLN
INTRAVENOUS | Status: AC
  Filled 2023-08-09 (×2): qty 1000

## 2023-08-09 MED ORDER — MAGNESIUM SULFATE 4 GM/100ML IV SOLN
4.0000 g | Freq: Once | INTRAVENOUS | Status: AC
  Administered 2023-08-09: 4 g via INTRAVENOUS
  Filled 2023-08-09: qty 100

## 2023-08-09 MED ORDER — ORAL CARE MOUTH RINSE: 15.0000 mL | OROMUCOSAL | Status: DC | PRN

## 2023-08-09 MED ORDER — PANTOPRAZOLE SODIUM 40 MG IV SOLR
40.0000 mg | Freq: Two times a day (BID) | INTRAVENOUS | Status: DC
  Administered 2023-08-09 – 2023-08-25 (×32): 40 mg via INTRAVENOUS
  Filled 2023-08-09 (×32): qty 10

## 2023-08-09 MED ORDER — IOHEXOL 350 MG/ML SOLN
100.0000 mL | Freq: Once | INTRAVENOUS | Status: AC | PRN
  Administered 2023-08-09: 100 mL via INTRAVENOUS

## 2023-08-09 MED ORDER — POTASSIUM CHLORIDE 10 MEQ/100ML IV SOLN
10.0000 meq | INTRAVENOUS | Status: AC
  Administered 2023-08-09 (×6): 10 meq via INTRAVENOUS
  Filled 2023-08-09 (×6): qty 100

## 2023-08-09 MED ORDER — POTASSIUM CHLORIDE 10 MEQ/50ML IV SOLN
10.0000 meq | INTRAVENOUS | Status: DC
  Administered 2023-08-09: 10 meq via INTRAVENOUS
  Filled 2023-08-09 (×2): qty 50

## 2023-08-09 MED ORDER — SODIUM CHLORIDE 0.9% FLUSH
10.0000 mL | Freq: Two times a day (BID) | INTRAVENOUS | Status: DC
  Administered 2023-08-09 – 2023-08-22 (×25): 10 mL via INTRAVENOUS

## 2023-08-09 NOTE — Progress Notes (Signed)
   08/09/23 1002  Adult Ventilator Settings  Vent Type Servo i  Vent Mode (S)  PSV;CPAP  FiO2 (%) 30 %  Pressure Support (S)  5 cmH20  PEEP (S)  5 cmH20  Adult Ventilator Measurements  Peak Airway Pressure 11 L/min  Mean Airway Pressure 7 cmH20  Resp Rate Spontaneous 35 br/min  Resp Rate Total 35 br/min  Spont TV 316 mL  Measured Ve 11.3 L  Total PEEP 5 cmH20  SpO2 100 %  Adult Ventilator Alarms  Alarms On Y  Ve High Alarm 20 L/min  Ve Low Alarm 4 L/min  Resp Rate High Alarm 38 br/min  Resp Rate Low Alarm 10  PEEP Low Alarm 3 cmH2O  Press High Alarm 45 cmH2O  T Apnea 20 sec(s)  VAP Prevention  HOB> 30 Degrees Y  Breath Sounds  Bilateral Breath Sounds Rhonchi;Diminished  Vent Respiratory Assessment  Level of Consciousness Alert (Pt is following commands, able to lift her head, stick out tongue, etc.)  Suction Method  Respiratory Interventions Airway suction;Oral suction  Oral Suctioning/Secretions  Suction Type Oral  Suction Device Yankauer  Secretion Amount Small  Secretion Color Clear  Secretion Consistency Thin  Suction Tolerance Tolerated well  Suctioning Adverse Effects None  Airway Suctioning/Secretions  Secretion Amount Small  Secretion Color Yellow;White  Secretion Consistency Thick;Thin  Suction Tolerance Tolerated well  Suctioning Adverse Effects None

## 2023-08-09 NOTE — Progress Notes (Signed)
   08/09/23 1040  Adult Ventilator Settings  Vent Mode (S)  PRVC (Ended vent wean due to excessive bleeding from PEG tube per RN.)  Vt Set 420 mL  Set Rate 16 bmp  FiO2 (%) 40 %  PEEP 5 cmH20  Adult Ventilator Measurements  SpO2 100 %

## 2023-08-09 NOTE — Progress Notes (Addendum)
  GI Progress Note Covering for Drs, [name] & Elnoria Howard    Assessment    S/P IR placed venting G-tube on 10/3. G-tube fell out today. Bleeding ulceration at G-tube site has stopped/slowed Ileus vs partial SBO from carcinomatosis Uterine cancer with peritoneal carcinomatosis  Acute respiratory failure  Recommendations:   Consult IR regarding G-tube replacement  NGT placement for gastric decompression until G-tube replaced Change pantoprazole to 40 mg IV 12h Mgmt discussed with Dr. Delton Coombes   Chief Complaint   Intubated. Dark bilious drainage from G-tube site, probably with some old blood. EGD yesterday with ulcer at G-tube site   Vital signs in last 24 hours: Temp:  [97 F (36.1 C)-104.7 F (40.4 C)] 99.1 F (37.3 C) (10/12 1028) Pulse Rate:  [31-138] 109 (10/12 1028) Resp:  [9-34] 34 (10/12 1028) BP: (57-212)/(34-184) 160/59 (10/12 1028) SpO2:  [92 %-100 %] 100 % (10/12 1040) FiO2 (%):  [30 %-100 %] 40 % (10/12 1040) Weight:  [77 kg] 77 kg (10/12 0026) Last BM Date : 08/06/23  General: Alert, well-developed, intubated Heart:  Regular rate and rhythm; no murmurs Chest: Clear to ascultation bilaterally Abdomen:  Soft, mild tenderness and nondistended. G-tube site with thin dark bilious drainage, Normal bowel sounds, without guarding, and without rebound.   Extremities:  Without edema. Neurologic:  Alert and  oriented x 4; grossly normal neurologically. Psych:  Alert.  Intake/Output from previous day: 10/11 0701 - 10/12 0700 In: 5920.4 [P.O.:120; I.V.:1989; Blood:1032.7; IV Piggyback:2778.7] Out: 50 [Urine:50] Intake/Output this shift: Total I/O In: 901.1 [I.V.:631.3; IV Piggyback:269.7] Out: -   Lab Results: Recent Labs    08/08/23 1901 08/08/23 2327 08/09/23 0653  WBC  --  8.5  --   HGB 10.5* 9.7* 10.3*  HCT 32.5* 30.3* 31.2*  PLT  --  102*  --    BMET Recent Labs    08/08/23 2215 08/08/23 2327 08/09/23 0139  NA 133* 135 132*  K 4.2 4.0 3.2*  CL 107 111  110  CO2 16* 15* 14*  GLUCOSE 136* 155* 199*  BUN QUANTITY NOT SUFFICIENT, UNABLE TO PERFORM TEST 38* 43*  CREATININE QUANTITY NOT SUFFICIENT, UNABLE TO PERFORM TEST 0.78 0.82  CALCIUM 8.8* 7.7* 7.7*   LFT Recent Labs    08/09/23 0139  PROT 5.0*  ALBUMIN 2.6*  AST 13*  ALT 18  ALKPHOS 104  BILITOT 1.4*   PT/INR Recent Labs    08/08/23 2215  LABPROT 13.8  INR 1.0     LOS: 26 days   Sherri Gill T. Russella Dar, MD 08/09/2023, 11:06 AM See Loretha Stapler, [facility], to contact our on call provider

## 2023-08-09 NOTE — Progress Notes (Signed)
Ileus (HCC)  Subjective: Pt febrile overnight, thought to be transfusion reaction but given immunocompromised nature, empiric antibiotics started.   Objective: Vital signs in last 24 hours: Temp:  [97 F (36.1 C)-104.7 F (40.4 C)] 98.8 F (37.1 C) (10/12 0615) Pulse Rate:  [31-138] 109 (10/12 0615) Resp:  [9-34] 22 (10/12 0615) BP: (57-212)/(34-184) 157/65 (10/12 0615) SpO2:  [92 %-100 %] 100 % (10/12 0615) FiO2 (%):  [30 %-100 %] 30 % (10/12 0354) Weight:  [77 kg] 77 kg (10/12 0026) Last BM Date : 08/06/23  Intake/Output from previous day: 10/11 0701 - 10/12 0700 In: 5573.1 [P.O.:120; I.V.:1859; Blood:1032.7; IV Piggyback:2561.4] Out: 50 [Urine:50] Intake/Output this shift: Total I/O In: 23.7 [I.V.:23.7] Out: -   General appearance: alert and cooperative GI: soft  Lab Results:  Results for orders placed or performed during the hospital encounter of 07/14/23 (from the past 24 hour(s))  Glucose, capillary     Status: Abnormal   Collection Time: 08/08/23 11:29 AM  Result Value Ref Range   Glucose-Capillary 144 (H) 70 - 99 mg/dL  Hemoglobin and hematocrit, blood     Status: Abnormal   Collection Time: 08/08/23 12:16 PM  Result Value Ref Range   Hemoglobin 9.1 (L) 12.0 - 15.0 g/dL   HCT 16.1 (L) 09.6 - 04.5 %  Type and screen [facility]     Status: None (Preliminary result)   Collection Time: 08/08/23 12:18 PM  Result Value Ref Range   ABO/RH(D) O POS    Antibody Screen NEG    Sample Expiration 08/11/2023,2359    Unit Number [id number]    Blood Component Type RED CELLS,LR    Unit division 00    Status of Unit ISSUED    Transfusion Status OK TO TRANSFUSE    Crossmatch Result Compatible    Unit Number [id number]    Blood Component Type RED CELLS,LR    Unit division 00    Status of Unit ISSUED    Transfusion Status OK TO TRANSFUSE    Crossmatch Result Compatible    Unit Number [id number]    Blood Component Type RED CELLS,LR    Unit  division 00    Status of Unit ALLOCATED    Transfusion Status OK TO TRANSFUSE    Crossmatch Result Compatible    Unit Number [id number]    Blood Component Type RED CELLS,LR    Unit division 00    Status of Unit ISSUED    Transfusion Status OK TO TRANSFUSE    Crossmatch Result      Compatible Performed at [facility], [address], [place], [place] [address]   Prepare RBC (crossmatch)     Status: None   Collection Time: 08/08/23 12:56 PM  Result Value Ref Range   Order Confirmation      ORDER PROCESSED BY BLOOD BANK Performed at [facility], [address], [place], [place] [address]   I-STAT, [place] 8     Status: Abnormal   Collection Time: 08/08/23  3:12 PM  Result Value Ref Range   Sodium 135 135 - 145 mmol/L   Potassium 3.7 3.5 - 5.1 mmol/L   Chloride 107 98 - 111 mmol/L   BUN 34 (H) 8 - 23 mg/dL   Creatinine, Ser 8.75 0.44 - 1.00 mg/dL   Glucose, Bld 643 (H) 70 - 99 mg/dL   Calcium, Ion 3.29 5.18 - 1.40 mmol/L   TCO2 18 (L) 22 - 32 mmol/L   Hemoglobin 6.1 (  LL) 12.0 - 15.0 g/dL   HCT 47.4 (L) 25.9 - 56.3 %  Prepare RBC (crossmatch)     Status: None   Collection Time: 08/08/23  3:19 PM  Result Value Ref Range   Order Confirmation      ORDER PROCESSED BY BLOOD BANK Performed at [facility], [address] [place], [place] [address]   Blood gas, arterial     Status: Abnormal   Collection Time: 08/08/23  5:18 PM  Result Value Ref Range   FIO2 50% %   O2 Content 50% L/min   Delivery systems VENTILATOR    Mode PRESSURE REGULATED VOLUME CONTROL    MECHVT 420 mL   RATE 16 resp/min   PEEP 5 cm H20   pH, Arterial 7.2 (L) 7.35 - 7.45   pCO2 arterial 46 32 - 48 mmHg   pO2, Arterial 138 (H) 83 - 108 mmHg   Bicarbonate 18.0 (L) 20.0 - 28.0 mmol/L   Acid-base deficit 9.9 (H) 0.0 - 2.0 mmol/L   O2 Saturation 99.9 %   Patient temperature 36.9    Collection site RIGHT RADIAL    Allens test (pass/fail) PASS PASS   Glucose, capillary     Status: Abnormal   Collection Time: 08/08/23  6:07 PM  Result Value Ref Range   Glucose-Capillary 155 (H) 70 - 99 mg/dL  Hemoglobin and hematocrit, blood     Status: Abnormal   Collection Time: 08/08/23  7:01 PM  Result Value Ref Range   Hemoglobin 10.5 (L) 12.0 - 15.0 g/dL   HCT 33.2 (L) 95.1 - 88.4 %  Glucose, capillary     Status: Abnormal   Collection Time: 08/08/23  7:41 PM  Result Value Ref Range   Glucose-Capillary 140 (H) 70 - 99 mg/dL  Comprehensive metabolic panel     Status: Abnormal   Collection Time: 08/08/23 10:15 PM  Result Value Ref Range   Sodium 133 (L) 135 - 145 mmol/L   Potassium 4.2 3.5 - 5.1 mmol/L   Chloride 107 98 - 111 mmol/L   CO2 16 (L) 22 - 32 mmol/L   Glucose, Bld 136 (H) 70 - 99 mg/dL   BUN QUANTITY NOT SUFFICIENT, UNABLE TO PERFORM TEST 8 - 23 mg/dL   Creatinine, Ser QUANTITY NOT SUFFICIENT, UNABLE TO PERFORM TEST 0.44 - 1.00 mg/dL   Calcium 8.8 (L) 8.9 - 10.3 mg/dL   Total Protein 6.9 6.5 - 8.1 g/dL   Albumin 3.7 3.5 - 5.0 g/dL   AST 20 15 - 41 U/L   ALT 25 0 - 44 U/L   Alkaline Phosphatase 149 (H) 38 - 126 U/L   Total Bilirubin 1.8 (H) 0.3 - 1.2 mg/dL   GFR, Estimated NOT CALCULATED >60 mL/min   Anion gap 10 5 - 15  Protime-INR     Status: None   Collection Time: 08/08/23 10:15 PM  Result Value Ref Range   Prothrombin Time 13.8 11.4 - 15.2 seconds   INR 1.0 0.8 - 1.2  APTT     Status: Abnormal   Collection Time: 08/08/23 10:15 PM  Result Value Ref Range   aPTT <20 (L) 24 - 36 seconds  Lactic acid, plasma     Status: None   Collection Time: 08/08/23 11:27 PM  Result Value Ref Range   Lactic Acid, Venous 1.8 0.5 - 1.9 mmol/L  CBC with Differential/Platelet     Status: Abnormal   Collection Time: 08/08/23 11:27 PM  Result Value Ref Range  WBC 8.5 4.0 - 10.5 K/uL   RBC 3.19 (L) 3.87 - 5.11 MIL/uL   Hemoglobin 9.7 (L) 12.0 - 15.0 g/dL   HCT 65.7 (L) 84.6 - 96.2 %   MCV 95.0 80.0 - 100.0 fL   MCH 30.4 26.0 -  34.0 pg   MCHC 32.0 30.0 - 36.0 g/dL   RDW 95.2 (H) 84.1 - 32.4 %   Platelets 102 (L) 150 - 400 K/uL   nRBC 0.0 0.0 - 0.2 %   Neutrophils Relative % 83 %   Neutro Abs 7.1 1.7 - 7.7 K/uL   Lymphocytes Relative 5 %   Lymphs Abs 0.4 (L) 0.7 - 4.0 K/uL   Monocytes Relative 3 %   Monocytes Absolute 0.3 0.1 - 1.0 K/uL   Eosinophils Relative 0 %   Eosinophils Absolute 0.0 0.0 - 0.5 K/uL   Basophils Relative 4 %   Basophils Absolute 0.3 (H) 0.0 - 0.1 K/uL   WBC Morphology Mild Left Shift (1-5% metas, occ myelo)    Metamyelocytes Relative 4 %   Myelocytes 1 %   Abs Immature Granulocytes 0.40 (H) 0.00 - 0.07 K/uL  Comprehensive metabolic panel     Status: Abnormal   Collection Time: 08/08/23 11:27 PM  Result Value Ref Range   Sodium 135 135 - 145 mmol/L   Potassium 4.0 3.5 - 5.1 mmol/L   Chloride 111 98 - 111 mmol/L   CO2 15 (L) 22 - 32 mmol/L   Glucose, Bld 155 (H) 70 - 99 mg/dL   BUN 38 (H) 8 - 23 mg/dL   Creatinine, Ser 4.01 0.44 - 1.00 mg/dL   Calcium 7.7 (L) 8.9 - 10.3 mg/dL   Total Protein 5.0 (L) 6.5 - 8.1 g/dL   Albumin 2.7 (L) 3.5 - 5.0 g/dL   AST 14 (L) 15 - 41 U/L   ALT 20 0 - 44 U/L   Alkaline Phosphatase 112 38 - 126 U/L   Total Bilirubin 1.2 0.3 - 1.2 mg/dL   GFR, Estimated >02 >72 mL/min   Anion gap 9 5 - 15  Procalcitonin     Status: None   Collection Time: 08/08/23 11:27 PM  Result Value Ref Range   Procalcitonin 1.85 ng/mL  Urinalysis, Routine w reflex microscopic -Urine, Catheterized     Status: None   Collection Time: 08/08/23 11:40 PM  Result Value Ref Range   Color, Urine YELLOW YELLOW   APPearance CLEAR CLEAR   Specific Gravity, Urine 1.018 1.005 - 1.030   pH 5.0 5.0 - 8.0   Glucose, UA NEGATIVE NEGATIVE mg/dL   Hgb urine dipstick NEGATIVE NEGATIVE   Bilirubin Urine NEGATIVE NEGATIVE   Ketones, ur NEGATIVE NEGATIVE mg/dL   Protein, ur NEGATIVE NEGATIVE mg/dL   Nitrite NEGATIVE NEGATIVE   Leukocytes,Ua NEGATIVE NEGATIVE  Glucose, capillary      Status: Abnormal   Collection Time: 08/08/23 11:45 PM  Result Value Ref Range   Glucose-Capillary 161 (H) 70 - 99 mg/dL  Comprehensive metabolic panel     Status: Abnormal   Collection Time: 08/09/23  1:39 AM  Result Value Ref Range   Sodium 132 (L) 135 - 145 mmol/L   Potassium 3.2 (L) 3.5 - 5.1 mmol/L   Chloride 110 98 - 111 mmol/L   CO2 14 (L) 22 - 32 mmol/L   Glucose, Bld 199 (H) 70 - 99 mg/dL   BUN 43 (H) 8 - 23 mg/dL   Creatinine, Ser 5.36 0.44 - 1.00 mg/dL   Calcium 7.7 (  L) 8.9 - 10.3 mg/dL   Total Protein 5.0 (L) 6.5 - 8.1 g/dL   Albumin 2.6 (L) 3.5 - 5.0 g/dL   AST 13 (L) 15 - 41 U/L   ALT 18 0 - 44 U/L   Alkaline Phosphatase 104 38 - 126 U/L   Total Bilirubin 1.4 (H) 0.3 - 1.2 mg/dL   GFR, Estimated >40 >98 mL/min   Anion gap 8 5 - 15  Magnesium     Status: Abnormal   Collection Time: 08/09/23  1:39 AM  Result Value Ref Range   Magnesium 1.5 (L) 1.7 - 2.4 mg/dL  Triglycerides     Status: Abnormal   Collection Time: 08/09/23  1:39 AM  Result Value Ref Range   Triglycerides 234 (H) <150 mg/dL  Lactic acid, plasma     Status: None   Collection Time: 08/09/23  1:39 AM  Result Value Ref Range   Lactic Acid, Venous 1.1 0.5 - 1.9 mmol/L  Glucose, capillary     Status: Abnormal   Collection Time: 08/09/23  3:47 AM  Result Value Ref Range   Glucose-Capillary 193 (H) 70 - 99 mg/dL  Glucose, capillary     Status: Abnormal   Collection Time: 08/09/23  6:36 AM  Result Value Ref Range   Glucose-Capillary 222 (H) 70 - 99 mg/dL  Hemoglobin and hematocrit, blood     Status: Abnormal   Collection Time: 08/09/23  6:53 AM  Result Value Ref Range   Hemoglobin 10.3 (L) 12.0 - 15.0 g/dL   HCT 11.9 (L) 14.7 - 82.9 %   *Note: Due to a large number of results and/or encounters for the requested time period, some results have not been displayed. A complete set of results can be found in Results Review.     Studies/Results Radiology     MEDS, Scheduled  sodium chloride    Intravenous Once   Chlorhexidine Gluconate Cloth  6 each Topical Daily   docusate  100 mg Per Tube BID   dorzolamide-timolol  1 drop Both Eyes BID   famotidine  20 mg Oral QHS   feeding supplement  1 Container Oral BID BM   insulin aspart  0-9 Units Subcutaneous Q6H   latanoprost  1 drop Both Eyes QHS   levothyroxine  50 mcg Oral Q0600   metoprolol tartrate  5 mg Intravenous Once   multivitamin  15 mL Oral Daily   pantoprazole  40 mg Oral BID AC   polyethylene glycol  17 g Oral Daily   senna  1 tablet Oral Daily   sodium chloride flush  10-40 mL Intracatheter Q12H     Assessment: Ileus (HCC) GI bleed due to G tube erosion  Plan:  Pt HD stable, off pressors, Hgb stable.   No indications for surgery at this time   LOS: 26 days    Vanita Panda, MD [facility]  Patient's medical decision making was moderate    08/09/2023 7:53 AM

## 2023-08-09 NOTE — Progress Notes (Signed)
NAME:  Sherri Gill, MRN:  [id number], DOB:  09/04/1951, LOS: 26 ADMISSION DATE:  07/23/2023, CONSULTATION DATE: 06-Sep-2023 REFERRING MD: Dr. Elnoria Howard, CHIEF COMPLAINT: Acute respiratory failure, shock  History of Present Illness:  72 year old woman with a history of recurrent stage IV uterine cancer with peritoneal carcinomatosis on chemotherapy per Dr. [name], atrial fibrillation on Xarelto, diabetes, hypertension.  She is admitted with refractory ileus due to her intra-abdominal tumor burden.  She has been on TPN, had a PEG placed on 10/3.  On 06-Sep-2023 she started bleeding for the PEG tube insertion site and had hematemesis.  Xarelto held, last dose was evening 10/10.  She went urgently for EGD 10/11> oozing gastric ulcer with active bleeding at the site of PEG tube insertion, unable to achieve hemostasis with clips and Hemospray.  Return to the ICU intubated, sedated and on low-dose phenylephrine.  Pertinent  Medical History   Past Medical History:  Diagnosis Date   AKI (acute kidney injury) (HCC)    History of   Ascites    09/2021 paracentesis with almost 3 L of fluid 12/2021 Paracentesis performed with 2.8 L   Carpal tunnel syndrome 02/11/2014   Chronic anemia 08/29/2012   Need colonoscopy or report. Need iron panel.    Dyslipidemia 06/30/2007   Dyspnea    Essential hypertension, benign 06/30/2007   GERD 06/30/2007   History of blood transfusion    History of fatty infiltration of liver    History of migraine    Hx of Herpes simplex meningitis 2015   Also noted to have primary empty sella on imaging at this admission   Insomnia 06/11/2012   Major depressive disorder, recurrent episode, moderate with anxious distress (HCC) 06/30/2007   Nausea and vomiting 01/13/2022   Obesity, BMI 35-40 06/11/2012   Peripheral neuropathy 2/2 T2DM 12/28/2009   Primary empty sella syndrome (HCC) 2015   Noted on imaging during hospitalization for herpes meningitis; no pituitary mass, no hormone w/u at  that time, hormonally asymptomatic   Type 2 diabetes mellitus with neurological complications (HCC) 06/30/2007    Significant Hospital Events: Including procedures, antibiotic start and stop dates in addition to other pertinent events   EGD 09-06-2023 >> actively bleeding gastric ulcer at PEG insertion site, unsuccessful clip and Hemospray.  To the ICU intubated and sedated  Interim History / Subjective:  -Patient had fever overnight, empiric cefepime and vancomycin started overnight.  Lactic acid, procalcitonin, cultures obtained -Patient has had some coffee-ground secretions from her mouth overnight -I/O+ 22.5 L total Lactic acid 1.1, WBC normal Hemoglobin 6.1 >> 10.3 this morning 0.30 + PEEP 5, 420 x 28   Objective   Blood pressure (!) 157/65, pulse (!) 109, temperature 98.8 F (37.1 C), resp. rate (!) 22, height 5\' 3"  (1.6 m), weight 77 kg, SpO2 100%.    Vent Mode: PRVC FiO2 (%):  [30 %-100 %] 30 % Set Rate:  [16 bmp-28 bmp] 28 bmp Vt Set:  [420 mL] 420 mL PEEP:  [5 cmH20] 5 cmH20 Plateau Pressure:  [19 cmH20-26 cmH20] 24 cmH20   Intake/Output Summary (Last 24 hours) at 08/09/2023 0738 Last data filed at 08/09/2023 0541 Gross per 24 hour  Intake 5573.08 ml  Output 50 ml  Net 5523.08 ml   Filed Weights   08/03/23 0500 09-06-23 0411 08/09/23 0026  Weight: 81.1 kg 78.9 kg 77 kg    Examination: General: Ill-appearing woman, sedated and ventilated HENT: ET tube in position.  She has slowly accumulating oral secretions mixed  with coffee-ground that are being suctioned.  Appear to be continuous Lungs: Clear bilaterally Cardiovascular: Regular, tachycardic, no murmur Abdomen: PEG tube is in place.  No active bleeding around the opening Extremities: No edema Neuro: Sedated, unresponsive on propofol 25 and fentanyl GU: Foley catheter  Resolved Hospital Problem list   Leukopenia Thrombocytopenia  Assessment & Plan:   Acute upper GI bleeding due to gastric ulcer at PEG  site.  Hemostasis not achieved on EGD. -Appreciate Dr. Sid Falcon assistance.  Surgery be quite difficult given her known carcinomatosis.  Recommends that if bleeding continues attempting PEG tube exchange with a balloon that could be used for tamponade. -Appears that hemoglobin stabilizing.  Continue to follow CBC -Received [facility] on 10/11 to reverse Xarelto -N.p.o., TF on hold -Transfuse for hemoglobin goal 8.0 insetting active bleeding  Acute respiratory failure due to hemodynamic instability, active GI bleeding, post EGD -PRVC 8 cc/kg -Goal decrease sedation and work towards SBT on 10/12 -We will need to see if it is acceptable to place an NG tube so we can establish low intermittent suction, avoid any complication from her oral secretions and residual coffee ground gastric secretions prior to extubation. Will discuss w GI -Pulmonary hygiene -VAP prevention orders  Fever overnight 10/11.  She received blood products rapidly via warmer, question whether this was a contributor.  Some question possible transfusion reaction although no other signs of same.  WBC and lactic acid reassuring.  Chest x-ray not done.  Cultures were drawn -Chest x-ray now -Follow culture data -Okay to continue vancomycin and cefepime empiric for now pending the infectious workup  Refractory ileus.  Apparently has poorly tolerated clamping G-tube, has required venting and TPN -Anticipate that she will likely have to restart TPN going forward once stabilized  Uterine cancer with peritoneal carcinomatosis -Followed by Dr. [name] and has been receiving chemotherapy.  Anticipate that this will be on hold at least temporarily to achieve stability, particularly if she requires surgical intervention  Hypothyroidism -Continue levothyroxine  Paroxysmal atrial fibrillation -Diltiazem and metoprolol currently on hold, restart as blood pressure and heart rate allow -Xarelto on hold   Best Practice (right click and "Reselect  all SmartList Selections" daily)   Diet/type: NPO DVT prophylaxis: SCD GI prophylaxis: PPI Lines: N/A Foley:  Yes, and it is still needed Code Status:  full code Last date of multidisciplinary goals of care discussion [pending] Family: Discussed status, plans with the patient's daughter at bedside on 10/12  Labs   CBC: Recent Labs  Lab 08/02/23 1330 08/04/23 0534 08/06/23 0559 08/06/2023 1216 08/03/2023 1512 08/18/2023 1901 08/17/2023 2327 08/09/23 0653  WBC 2.8* 4.5 8.6  --   --   --  8.5  --   NEUTROABS  --   --   --   --   --   --  7.1  --   HGB 9.4* 9.0* 10.2* 9.1* 6.1* 10.5* 9.7* 10.3*  HCT 29.4* 29.2* 32.5* 30.1* 18.0* 32.5* 30.3* 31.2*  MCV 88.6 91.8 90.3  --   --   --  95.0  --   PLT 116* 116* 154  --   --   --  102*  --     Basic Metabolic Panel: Recent Labs  Lab 08/04/23 0534 08/05/23 0545 08/06/23 0559 08/07/23 0604 08/22/2023 0601 08/25/2023 1512 08/04/2023 2215 08/17/2023 2327 08/09/23 0139  NA 137 135 135 133* 130* 135 133* 135 132*  K 4.2 4.1 3.8 4.0 3.8 3.7 4.2 4.0 3.2*  CL 100 99 105 104  99 107 107 111 110  CO2 27 25 21* 19* 21*  --  16* 15* 14*  GLUCOSE 171* 132* 148* 138* 137* 193* 136* 155* 199*  BUN 24* 20 18 27* 31* 34* QUANTITY NOT SUFFICIENT, UNABLE TO PERFORM TEST 38* 43*  CREATININE 0.76 0.72 0.67 0.72 0.75 0.70 QUANTITY NOT SUFFICIENT, UNABLE TO PERFORM TEST 0.78 0.82  CALCIUM 9.2 9.3 9.3 9.3 9.4  --  8.8* 7.7* 7.7*  MG 1.5* 1.6* 1.8 1.6* 1.5*  --   --   --  1.5*  PHOS 3.5  --   --  3.5 3.8  --   --   --   --    GFR: Estimated Creatinine Clearance: 62.7 mL/min (by C-G formula based on SCr of 0.82 mg/dL). Recent Labs  Lab 08/02/23 1330 08/04/23 0534 08/06/23 0559 08/23/23 2327 08/09/23 0139  PROCALCITON  --   --   --  1.85  --   WBC 2.8* 4.5 8.6 8.5  --   LATICACIDVEN  --   --   --  1.8 1.1    Liver Function Tests: Recent Labs  Lab 08/07/23 0604 August 23, 2023 0601 August 23, 2023 2215 23-Aug-2023 2327 08/09/23 0139  AST 35 20 20 14* 13*  ALT 56*  40 25 20 18   ALKPHOS 239* 229* 149* 112 104  BILITOT 0.4 0.4 1.8* 1.2 1.4*  PROT 7.0 7.1 6.9 5.0* 5.0*  ALBUMIN 2.7* 2.6* 3.7 2.7* 2.6*   No results for input(s): "LIPASE", "AMYLASE" in the last 168 hours. No results for input(s): "AMMONIA" in the last 168 hours.  ABG    Component Value Date/Time   PHART 7.2 (L) 23-Aug-2023 1718   PCO2ART 46 08-23-23 1718   PO2ART 138 (H) 08-23-23 1718   HCO3 18.0 (L) Aug 23, 2023 1718   TCO2 18 (L) 08/23/2023 1512   ACIDBASEDEF 9.9 (H) 2023-08-23 1718   O2SAT 99.9 08/23/2023 1718     Coagulation Profile: Recent Labs  Lab 2023/08/23 2215  INR 1.0    Cardiac Enzymes: No results for input(s): "CKTOTAL", "CKMB", "CKMBINDEX", "TROPONINI" in the last 168 hours.  HbA1C: Hemoglobin A1C  Date/Time Value Ref Range Status  02/20/2023 10:32 AM 6.4 (A) 4.0 - 5.6 % Final  08/22/2022 09:21 AM 6.0 (A) 4.0 - 5.6 % Final   Hgb A1c MFr Bld  Date/Time Value Ref Range Status  04/05/2022 01:30 PM 6.5 (H) 4.8 - 5.6 % Final    Comment:    (NOTE) Pre diabetes:          5.7%-6.4%  Diabetes:              >6.4%  Glycemic control for   <7.0% adults with diabetes   12/29/2021 05:40 AM 6.7 (H) 4.8 - 5.6 % Final    Comment:    (NOTE) Pre diabetes:          5.7%-6.4%  Diabetes:              >6.4%  Glycemic control for   <7.0% adults with diabetes     CBG: Recent Labs  Lab Aug 23, 2023 1807 08/23/23 1941 23-Aug-2023 2345 08/09/23 0347 08/09/23 0636  GLUCAP 155* 140* 161* 193* 222*     Critical care time: 33 min      Levy Pupa, MD, PhD 08/09/2023, 7:38 AM [facility] [phone] or if no answer before 7:00PM call [phone] For any issues after 7:00PM please call eLink [phone]

## 2023-08-09 NOTE — Progress Notes (Signed)
PHARMACY - TOTAL PARENTERAL NUTRITION CONSULT NOTE   Indication: Prolonged ileus  Patient Measurements: Height: 5\' 3"  (160 cm) Weight: 77 kg (169 lb 12.1 oz) IBW/kg (Calculated) : 52.4 TPN AdjBW (KG): 58.6 Body mass index is 30.07 kg/m.  Assessment:  [name] admitted 9/16 for nausea/vomiting x 2.5 weeks. Patient with h/o recurrent uterine cancer--Bx taken during 9/18 small bowel enteroscopy but no signs of malignant obstruction, negative for malignancy. Oncology recommended resumption of chemotherapy during this admission--carboplatin and paclitaxel completed 9/25.   Patient has severe malnutrition given inability to keep food down with continued N/V during this admission and PTA. Pharmacy consulted for TPN management.  - Carcinomatosis causing delayed gastric motility   Glucose / Insulin: Hx of T2DM -BG goal < 180. Range: 128-199 (8 units SSI/24 hrs) Electrolytes:  Na low at 132 K low at 3.2-replaced by provider this am CorrCa WNL at 9.14 Mag low at 1.5-replaced by provider this am Renal: SCr, BUN WNL Hepatic: Alk Phos has trended down to WNL. ALT has returned to WNL.  AST low at 13. - Albumin low at 2.6 - TG up/elevated at 234, Tbili up slightly 1.4 Intake / Output; MIVF: Strict I/O not measured -UOP: no documentation other than 50 ml urine per charting last 24hr -PO intake: 120 mL -no mIVF  GI Imaging: - 9/20 DG UGI: Gastric fold thickening as shown on prior CT and endoscopy. Mildly dilated duodenum, with sluggish flow of contrast through the duodenum--possibility of a more distal SBO is not excluded given this appearance. - 9/29 abd Xray: Slight increase in central small bowel dilatation.   GI Surgeries / Procedures:  - 9/18: small bowel endoscopy: Normal appearing, widely patent esophagus and GEJ. Edematous gastric folds-biopsied. A single duodenal polyp in the post-bulbar duodenum-biopsied - 10/3:  placement of gastrostomy tube today to be used for  venting  Central access: port TPN start date:  9/26  Nutritional Goals:  Current TPN (Clinimix E 8/10) providing 77g protein and 634 kcal daily and on MWF with fat administration adds 500 kcal more to = 1134 kcal on those days  RD Assessment: Estimated Needs Total Energy Estimated Needs: 1650-1850 Total Protein Estimated Needs: 85-100g Total Fluid Estimated Needs: 1.8L/day  Current Nutrition:  - TPN 9/26>> - 10/4: CLD - 10/7: calorie count started, Glucerna 237 mL TID ordered  - 10/8: pt has n/v wit poor oral intake. Per Dr. Maryfrances Bunnell, cont TPN for now - 10/11: TPN continues at half rate, supplements started per RD and on [place] diet  Plan:  Due to the Baxter IV fluid disruption, we will be switching all adult compounded parenteral nutrition to premade Clinimix products +/- fat emulsion infusion. Our goal will be to continue providing as close to 100% of our patient's nutritional needs. Due to the volatile availability of different Clinimix concentrations we may not be able to meet 100% of adult patient nutritional goals at this time.   Today, at 1800:  Continue Clinimix E 8/10 @ 40 mL/hr. No lipids today. Pt taking PO medications, MVI transitioned to PO Continue q6h sSSI TPN labs Mon, Thurs. Check additional labs PRN. Follow for ability to wean off of TPN once tolerating PO intake or if PO intake does not improve may need to re-consider increasing TPN back towards goal per RD   Thank you for allowing pharmacy to be a part of this patient's care.  Selinda Eon, PharmD, BCPS Clinical Pharmacist [place] 08/09/2023 8:02 AM

## 2023-08-09 NOTE — Progress Notes (Signed)
   08/09/23 1012  Adult Ventilator Settings  Vent Mode PSV;CPAP  FiO2 (%) 30 %  Pressure Support (S)  10 cmH20 (Increased to PSV 10, for pt comfort, persistent high RR 35, encouraged pt to slow breathing down.)  PEEP 5 cmH20  Adult Ventilator Measurements  Peak Airway Pressure 16 L/min  Mean Airway Pressure 8 cmH20  Resp Rate Spontaneous 36 br/min  Resp Rate Total 36 br/min  Spont TV 514 mL  Measured Ve 15.4 L  Total PEEP 5 cmH20  SpO2 100 %  Adult Ventilator Alarms  Alarms On Y

## 2023-08-10 DIAGNOSIS — K567 Ileus, unspecified: Secondary | ICD-10-CM | POA: Diagnosis not present

## 2023-08-10 DIAGNOSIS — D62 Acute posthemorrhagic anemia: Secondary | ICD-10-CM | POA: Diagnosis not present

## 2023-08-10 DIAGNOSIS — K250 Acute gastric ulcer with hemorrhage: Secondary | ICD-10-CM

## 2023-08-10 DIAGNOSIS — J9601 Acute respiratory failure with hypoxia: Secondary | ICD-10-CM | POA: Diagnosis not present

## 2023-08-10 DIAGNOSIS — Z7901 Long term (current) use of anticoagulants: Secondary | ICD-10-CM

## 2023-08-10 LAB — GLUCOSE, CAPILLARY
Glucose-Capillary: 112 mg/dL — ABNORMAL HIGH (ref 70–99)
Glucose-Capillary: 115 mg/dL — ABNORMAL HIGH (ref 70–99)
Glucose-Capillary: 122 mg/dL — ABNORMAL HIGH (ref 70–99)
Glucose-Capillary: 143 mg/dL — ABNORMAL HIGH (ref 70–99)
Glucose-Capillary: 153 mg/dL — ABNORMAL HIGH (ref 70–99)

## 2023-08-10 LAB — CBC
HCT: 27.8 % — ABNORMAL LOW (ref 36.0–46.0)
HCT: 30.4 % — ABNORMAL LOW (ref 36.0–46.0)
Hemoglobin: 9 g/dL — ABNORMAL LOW (ref 12.0–15.0)
Hemoglobin: 9.7 g/dL — ABNORMAL LOW (ref 12.0–15.0)
MCH: 30.5 pg (ref 26.0–34.0)
MCH: 30.2 pg (ref 26.0–34.0)
MCHC: 32.4 g/dL (ref 30.0–36.0)
MCHC: 31.9 g/dL (ref 30.0–36.0)
MCV: 94.2 fL (ref 80.0–100.0)
MCV: 94.7 fL (ref 80.0–100.0)
Platelets: 144 10*3/uL — ABNORMAL LOW (ref 150–400)
Platelets: 166 10*3/uL (ref 150–400)
RBC: 2.95 MIL/uL — ABNORMAL LOW (ref 3.87–5.11)
RBC: 3.21 MIL/uL — ABNORMAL LOW (ref 3.87–5.11)
RDW: 16.6 % — ABNORMAL HIGH (ref 11.5–15.5)
RDW: 16.7 % — ABNORMAL HIGH (ref 11.5–15.5)
WBC: 20.8 10*3/uL — ABNORMAL HIGH (ref 4.0–10.5)
WBC: 22.8 10*3/uL — ABNORMAL HIGH (ref 4.0–10.5)
nRBC: 0.1 % (ref 0.0–0.2)
nRBC: 0 % (ref 0.0–0.2)

## 2023-08-10 LAB — BASIC METABOLIC PANEL
Anion gap: 8 (ref 5–15)
BUN: 40 mg/dL — ABNORMAL HIGH (ref 8–23)
CO2: 18 mmol/L — ABNORMAL LOW (ref 22–32)
Calcium: 9.1 mg/dL (ref 8.9–10.3)
Chloride: 107 mmol/L (ref 98–111)
Creatinine, Ser: 0.81 mg/dL (ref 0.44–1.00)
GFR, Estimated: >60 mL/min (ref >60)
Glucose, Bld: 142 mg/dL — ABNORMAL HIGH (ref 70–99)
Potassium: 3.5 mmol/L (ref 3.5–5.1)
Sodium: 133 mmol/L — ABNORMAL LOW (ref 135–145)

## 2023-08-10 LAB — MAGNESIUM: Magnesium: 2.2 mg/dL (ref 1.7–2.4)

## 2023-08-10 MED ORDER — LORAZEPAM 2 MG/ML IJ SOLN
INTRAMUSCULAR | Status: AC
  Filled 2023-08-10: qty 1

## 2023-08-10 MED ORDER — ADULT MULTIVITAMIN LIQUID CH: 15.0000 mL | Freq: Every day | ORAL | Status: DC

## 2023-08-10 MED ORDER — METOPROLOL TARTRATE 5 MG/5ML IV SOLN
5.0000 mg | Freq: Once | INTRAVENOUS | Status: AC
  Administered 2023-08-10: 5 mg via INTRAVENOUS
  Filled 2023-08-10: qty 5

## 2023-08-10 MED ORDER — FAMOTIDINE 20 MG PO TABS
20.0000 mg | ORAL_TABLET | Freq: Every day | ORAL | Status: DC
  Administered 2023-08-10: 20 mg
  Filled 2023-08-10: qty 1

## 2023-08-10 MED ORDER — LORAZEPAM 2 MG/ML IJ SOLN
1.0000 mg | Freq: Once | INTRAMUSCULAR | Status: AC
  Administered 2023-08-10: 1 mg via INTRAVENOUS
  Filled 2023-08-10: qty 1

## 2023-08-10 MED ORDER — MAGNESIUM SULFATE 2 GM/50ML IV SOLN
2.0000 g | Freq: Once | INTRAVENOUS | Status: AC
  Administered 2023-08-10: 2 g via INTRAVENOUS

## 2023-08-10 MED ORDER — SIMETHICONE 80 MG PO CHEW: 80.0000 mg | CHEWABLE_TABLET | Freq: Four times a day (QID) | ORAL | Status: DC | PRN

## 2023-08-10 MED ORDER — POTASSIUM CHLORIDE 10 MEQ/100ML IV SOLN
10.0000 meq | INTRAVENOUS | Status: AC
  Administered 2023-08-10 (×5): 10 meq via INTRAVENOUS
  Filled 2023-08-10 (×5): qty 100

## 2023-08-10 MED ORDER — POLYETHYLENE GLYCOL 3350 17 G PO PACK: 17.0000 g | PACK | Freq: Every day | ORAL | Status: DC

## 2023-08-10 MED ORDER — TRACE MINERALS CU-MN-SE-ZN 300-55-60-3000 MCG/ML IV SOLN
INTRAVENOUS | Status: AC
  Filled 2023-08-10 (×2): qty 1000

## 2023-08-10 MED ORDER — SENNA 8.6 MG PO TABS: 1.0000 | ORAL_TABLET | Freq: Every day | ORAL | Status: DC

## 2023-08-10 MED ORDER — PHENOL 1.4 % MT LIQD
1.0000 | OROMUCOSAL | Status: DC | PRN
  Administered 2023-08-10: 1 via OROMUCOSAL
  Filled 2023-08-10: qty 177

## 2023-08-10 MED ORDER — LEVOTHYROXINE SODIUM 50 MCG PO TABS
50.0000 ug | ORAL_TABLET | Freq: Every day | ORAL | Status: DC
  Administered 2023-08-11: 50 ug
  Filled 2023-08-10: qty 1

## 2023-08-10 MED ORDER — MAGNESIUM SULFATE 2 GM/50ML IV SOLN
2.0000 g | Freq: Once | INTRAVENOUS | Status: DC
  Filled 2023-08-10 (×2): qty 50

## 2023-08-10 MED ORDER — FAT EMUL FISH OIL/PLANT BASED 20% (SMOFLIPID)IV EMUL
250.0000 mL | INTRAVENOUS | Status: AC
  Administered 2023-08-10: 250 mL via INTRAVENOUS
  Filled 2023-08-10: qty 250

## 2023-08-10 NOTE — Progress Notes (Signed)
     Brief Palliative progress note:  Chart reviewed. On 10/11, patient had bleeding from PEG tube insertion site and hematemesis. She underwent urgent EGD and remained intubated after the procedure. She was extubated earlier today 10/13.   I spoke with her daughter Zacarias Pontes by phone. Plan for GOC meeting tomorrow after 2 pm.    Sherlean Foot, NP-C Palliative Medicine   Please call Palliative Medicine team phone with any questions [phone]. For individual providers please see AMION.   No charge

## 2023-08-10 NOTE — Procedures (Signed)
Extubation Procedure Note  Patient Details:   Name: SARABI SOCKWELL DOB: 06/03/1951 MRN: [id number]   Airway Documentation:    Vent end date: 08/10/23 Vent end time: 0835   Evaluation  O2 sats: stable throughout Complications: No apparent complications Patient did tolerate procedure well. Bilateral Breath Sounds: Clear, Diminished   Yes  Pt was extubated to 4L [place] per CCMD order with saturations of 100%, will wean O2 as pt progresses. Pt was suctioned and a positive cuff leak was heard prior to extubation. Pt was able to cough and speak afterwards. No stridor is heard at this time. Vitals are currently stable and RN is at bedside.  Semaj Coburn A Tavarius Grewe 08/10/2023, 8:40 AM

## 2023-08-10 NOTE — Progress Notes (Signed)
PHARMACY - TOTAL PARENTERAL NUTRITION CONSULT NOTE   Indication: Prolonged ileus  Patient Measurements: Height: 5\' 3"  (160 cm) Weight: 79.9 kg (176 lb 2.4 oz) IBW/kg (Calculated) : 52.4 TPN AdjBW (KG): 58.6 Body mass index is 31.2 kg/m.  Assessment:  [name] admitted 9/16 for nausea/vomiting x 2.5 weeks. Patient with h/o recurrent uterine cancer--Bx taken during 9/18 small bowel enteroscopy but no signs of malignant obstruction, negative for malignancy. Oncology recommended resumption of chemotherapy during this admission--carboplatin and paclitaxel completed 9/25.   Patient has severe malnutrition given inability to keep food down with continued N/V during this admission and PTA. Pharmacy consulted for TPN management.  - Carcinomatosis causing delayed gastric motility   Glucose / Insulin: Hx of T2DM -BG goal < 180. Range: 124-222 (4 units SSI/24 hrs) Electrolytes:  BMP & magnesium ordered; awaiting results for replacement if needed separate from TPN Renal: SCr, BUN WNL Hepatic: Alk Phos has trended down to WNL. ALT has returned to WNL.  AST low at 13. - Albumin low at 2.6 - TG up/elevated at 234, Tbili up slightly 1.4 Intake / Output; MIVF: Strict I/O not measured -UOP: no documentation other than 50 ml urine per charting last 24hr -PO intake: 120 mL -no mIVF  GI Imaging: - 9/20 DG UGI: Gastric fold thickening as shown on prior CT and endoscopy. Mildly dilated duodenum, with sluggish flow of contrast through the duodenum--possibility of a more distal SBO is not excluded given this appearance. - 9/29 abd Xray: Slight increase in central small bowel dilatation.   GI Surgeries / Procedures:  - 9/18: small bowel endoscopy: Normal appearing, widely patent esophagus and GEJ. Edematous gastric folds-biopsied. A single duodenal polyp in the post-bulbar duodenum-biopsied - 10/3:  placement of gastrostomy tube today to be used for venting -10/12: PEG tube came out. NG tube placed  for stomach decompression, suction   Central access: port TPN start date:  9/26  Nutritional Goals:  Current TPN (1L Clinimix E 8/10) with fat administration daily provides 80 g protein and 1159 kcal daily   RD Assessment: Estimated Needs Total Energy Estimated Needs: 1650-1850 Total Protein Estimated Needs: 85-100g Total Fluid Estimated Needs: 1.8L/day  Current Nutrition:  - TPN 9/26>> - 10/4: CLD - 10/7: calorie count started, Glucerna 237 mL TID ordered  - 10/8: pt has n/v wit poor oral intake. Per Dr. Maryfrances Bunnell, cont TPN for now - 10/11: TPN continues at half rate, supplements started per RD and on [place] diet - 10/13 TPN, pt is now NPO, awaiting G-tube replacement  Plan:  Due to the Baxter IV fluid disruption, we will be switching all adult compounded parenteral nutrition to premade Clinimix products +/- fat emulsion infusion. Our goal will be to continue providing as close to 100% of our patient's nutritional needs. Due to the volatile availability of different Clinimix concentrations we may not be able to meet 100% of adult patient nutritional goals at this time.   Today, at 1800:  Clinimix E 8/10 @ 41.6 ml/hr + fat emulsion 20% 250 ml over 12 hours Pt taking PO medications, MVI transitioned to PO Continue q6h sSSI TPN labs Mon, Thurs. Check additional labs PRN.   Thank you for allowing pharmacy to be a part of this patient's care.  Selinda Eon, PharmD, BCPS Clinical Pharmacist [facility] 08/10/2023 8:18 AM

## 2023-08-10 NOTE — Progress Notes (Signed)
NAME:  Sherri Gill, MRN:  [id number], DOB:  May 16, 1951, LOS: 27 ADMISSION DATE:  07/01/2023, CONSULTATION DATE: 2023-08-22 REFERRING MD: Dr. Elnoria Howard, CHIEF COMPLAINT: Acute respiratory failure, shock  History of Present Illness:  72 year old woman with a history of recurrent stage IV uterine cancer with peritoneal carcinomatosis on chemotherapy per Dr. [name], atrial fibrillation on Xarelto, diabetes, hypertension.  She is admitted with refractory ileus due to her intra-abdominal tumor burden.  She has been on TPN, had a PEG placed on 10/3.  On 08-22-23 she started bleeding for the PEG tube insertion site and had hematemesis.  Xarelto held, last dose was evening 10/10.  She went urgently for EGD 10/11> oozing gastric ulcer with active bleeding at the site of PEG tube insertion, unable to achieve hemostasis with clips and Hemospray.  Return to the ICU intubated, sedated and on low-dose phenylephrine.  Pertinent  Medical History   Past Medical History:  Diagnosis Date   AKI (acute kidney injury) (HCC)    History of   Ascites    09/2021 paracentesis with almost 3 L of fluid 12/2021 Paracentesis performed with 2.8 L   Carpal tunnel syndrome 02/11/2014   Chronic anemia 08/29/2012   Need colonoscopy or report. Need iron panel.    Dyslipidemia 06/30/2007   Dyspnea    Essential hypertension, benign 06/30/2007   GERD 06/30/2007   History of blood transfusion    History of fatty infiltration of liver    History of migraine    Hx of Herpes simplex meningitis 2015   Also noted to have primary empty sella on imaging at this admission   Insomnia 06/11/2012   Major depressive disorder, recurrent episode, moderate with anxious distress (HCC) 06/30/2007   Nausea and vomiting 01/13/2022   Obesity, BMI 35-40 06/11/2012   Peripheral neuropathy 2/2 T2DM 12/28/2009   Primary empty sella syndrome (HCC) 2015   Noted on imaging during hospitalization for herpes meningitis; no pituitary mass, no hormone w/u at  that time, hormonally asymptomatic   Type 2 diabetes mellitus with neurological complications (HCC) 06/30/2007    Significant Hospital Events: Including procedures, antibiotic start and stop dates in addition to other pertinent events   EGD Aug 22, 2023 >> actively bleeding gastric ulcer at PEG insertion site, unsuccessful clip and Hemospray.  To the ICU intubated and sedated 10/12 empiric antibiotics for fever 10/12 PEG tube came out.  NG tube placed for stomach decompression, suction  Interim History / Subjective:  -PEG tube came out so NG tube placed to decompress stomach, deal with secretions -Procalcitonin 1.85, WBC 8.5, lactic acid 1.8 > 1.1 -Hemoglobin 9.7 > 10.3 > 9.6 -Potassium 3.2, magnesium 1.5 -Fentanyl 75, propofol 45    Objective   Blood pressure (!) 101/34, pulse 83, temperature 98.4 F (36.9 C), resp. rate (!) 28, height 5\' 3"  (1.6 m), weight 79.9 kg, SpO2 100%.    Vent Mode: PRVC FiO2 (%):  [30 %] 30 % Set Rate:  [28 bmp] 28 bmp Vt Set:  [420 mL] 420 mL PEEP:  [5 cmH20] 5 cmH20 Pressure Support:  [5 cmH20-10 cmH20] 10 cmH20 Plateau Pressure:  [20 cmH20-25 cmH20] 21 cmH20   Intake/Output Summary (Last 24 hours) at 08/10/2023 0759 Last data filed at 08/10/2023 0658 Gross per 24 hour  Intake 3359.55 ml  Output 2400 ml  Net 959.55 ml   Filed Weights   08/22/23 0411 08/09/23 0026 08/10/23 0432  Weight: 78.9 kg 77 kg 79.9 kg    Examination: General: Chronically ill-appearing woman, intubated and  on SBT HENT: ET tube, NG tube with bilious secretions Lungs: Clear bilaterally Cardiovascular: Regular, no murmur Abdomen: Some bilious secretions from her PEG tube site Extremities: No edema Neuro: Awake, alert, following commands, good strength, good cough GU: Foley catheter  Resolved Hospital Problem list   Leukopenia Thrombocytopenia  Assessment & Plan:   Acute upper GI bleeding due to gastric ulcer/erosion at PEG site.  Hemostasis not achieved on EGD.   Bleeding stabilized with reversal Xarelto.  PEG out on 10/12 -PPI every 12 hours -NG tube placed, continue low remittent suction and plan to leave the tube in place even with extubation -Following CBC -Transfusion goal hemoglobin 8.0 in the setting of active bleeding, can likely decrease goal to 7.0 if remains stable on 10/13 -Xarelto held -Appreciate GI follow-up, surgery follow-up  Acute respiratory failure due to hemodynamic instability, active GI bleeding, post EGD -PRVC 8 cc/kg -Decrease sedation on 10/13 and work for SBT -Leave NG tube in place to deal with coffee-ground gastric secretions that precluded extubation on 10/12 -VAP prevention orders -Pulmonary hygiene  Fever overnight 10/11.  She received blood products rapidly via warmer, question whether this was a contributor.  Some question possible transfusion reaction although no other signs of same.  WBC and lactic acid reassuring.  Cultures pending.  Chest x-ray low lung volumes -Follow culture data -Stop vancomycin on 10/13, low threshold to stop cefepime 10/14 if no evidence active infection  Refractory ileus.  Has poorly tolerated clamping G-tube, has required venting and TPN -Anticipate that she will likely have to restart TPN going forward  Uterine cancer with peritoneal carcinomatosis -Followed by Dr. [name] and has been receiving chemotherapy.  Anticipate that this will be on hold at least temporarily.  Timing of restart will depend on stability from acute events  Hypothyroidism -Continue levothyroxine  Paroxysmal atrial fibrillation -Diltiazem and metoprolol currently on hold, restart as blood pressure and heart rate allow -Xarelto on hold   Best Practice (right click and "Reselect all SmartList Selections" daily)   Diet/type: NPO DVT prophylaxis: SCD GI prophylaxis: PPI Lines: N/A Foley:  Yes, and it is still needed Code Status:  full code Last date of multidisciplinary goals of care discussion  [pending] Family: Discussed status, plans with the patient's daughter at bedside on 10/12, 10/13  Labs   CBC: Recent Labs  Lab 08/04/23 0534 08/06/23 0559 08/11/2023 1216 08/04/2023 1512 08/19/2023 1901 08/07/2023 2327 08/09/23 0653 08/09/23 1540  WBC 4.5 8.6  --   --   --  8.5  --   --   NEUTROABS  --   --   --   --   --  7.1  --   --   HGB 9.0* 10.2*   < > 6.1* 10.5* 9.7* 10.3* 9.6*  HCT 29.2* 32.5*   < > 18.0* 32.5* 30.3* 31.2* 30.2*  MCV 91.8 90.3  --   --   --  95.0  --   --   PLT 116* 154  --   --   --  102*  --   --    < > = values in this interval not displayed.    Basic Metabolic Panel: Recent Labs  Lab 08/04/23 0534 08/05/23 0545 08/06/23 0559 08/07/23 0604 08/27/2023 0601 08/21/2023 1512 08/07/2023 2215 08/17/2023 2327 08/09/23 0139  NA 137 135 135 133* 130* 135 133* 135 132*  K 4.2 4.1 3.8 4.0 3.8 3.7 4.2 4.0 3.2*  CL 100 99 105 104 99 107 107 111 110  CO2 27 25 21* 19* 21*  --  16* 15* 14*  GLUCOSE 171* 132* 148* 138* 137* 193* 136* 155* 199*  BUN 24* 20 18 27* 31* 34* QUANTITY NOT SUFFICIENT, UNABLE TO PERFORM TEST 38* 43*  CREATININE 0.76 0.72 0.67 0.72 0.75 0.70 QUANTITY NOT SUFFICIENT, UNABLE TO PERFORM TEST 0.78 0.82  CALCIUM 9.2 9.3 9.3 9.3 9.4  --  8.8* 7.7* 7.7*  MG 1.5* 1.6* 1.8 1.6* 1.5*  --   --   --  1.5*  PHOS 3.5  --   --  3.5 3.8  --   --   --   --    GFR: Estimated Creatinine Clearance: 63.9 mL/min (by C-G formula based on SCr of 0.82 mg/dL). Recent Labs  Lab 08/04/23 0534 08/06/23 0559 08/22/2023 2327 08/09/23 0139  PROCALCITON  --   --  1.85  --   WBC 4.5 8.6 8.5  --   LATICACIDVEN  --   --  1.8 1.1    Liver Function Tests: Recent Labs  Lab 08/07/23 0604 07/29/2023 0601 08/28/2023 2215 08/04/2023 2327 08/09/23 0139  AST 35 20 20 14* 13*  ALT 56* 40 25 20 18   ALKPHOS 239* 229* 149* 112 104  BILITOT 0.4 0.4 1.8* 1.2 1.4*  PROT 7.0 7.1 6.9 5.0* 5.0*  ALBUMIN 2.7* 2.6* 3.7 2.7* 2.6*   No results for input(s): "LIPASE", "AMYLASE" in the  last 168 hours. No results for input(s): "AMMONIA" in the last 168 hours.  ABG    Component Value Date/Time   PHART 7.2 (L) 08/19/2023 1718   PCO2ART 46 08/05/2023 1718   PO2ART 138 (H) 08/05/2023 1718   HCO3 18.0 (L) 08/16/2023 1718   TCO2 18 (L) 08/27/2023 1512   ACIDBASEDEF 9.9 (H) 08/21/2023 1718   O2SAT 99.9 08/12/2023 1718     Coagulation Profile: Recent Labs  Lab 08/05/2023 2215  INR 1.0    Cardiac Enzymes: No results for input(s): "CKTOTAL", "CKMB", "CKMBINDEX", "TROPONINI" in the last 168 hours.  HbA1C: Hemoglobin A1C  Date/Time Value Ref Range Status  02/20/2023 10:32 AM 6.4 (A) 4.0 - 5.6 % Final  08/22/2022 09:21 AM 6.0 (A) 4.0 - 5.6 % Final   Hgb A1c MFr Bld  Date/Time Value Ref Range Status  04/05/2022 01:30 PM 6.5 (H) 4.8 - 5.6 % Final    Comment:    (NOTE) Pre diabetes:          5.7%-6.4%  Diabetes:              >6.4%  Glycemic control for   <7.0% adults with diabetes   12/29/2021 05:40 AM 6.7 (H) 4.8 - 5.6 % Final    Comment:    (NOTE) Pre diabetes:          5.7%-6.4%  Diabetes:              >6.4%  Glycemic control for   <7.0% adults with diabetes     CBG: Recent Labs  Lab 08/09/23 1415 08/09/23 1533 08/09/23 1731 08/09/23 2349 08/10/23 0616  GLUCAP 159* 130* 124* 148* 115*     Critical care time: 35 min      Levy Pupa, MD, PhD 08/10/2023, 7:59 AM [facility] [phone] or if no answer before 7:00PM call [phone] For any issues after 7:00PM please call eLink [phone]

## 2023-08-10 NOTE — Progress Notes (Signed)
[facility] Progress Note  Chief Complaint: Gastric ulcer bleed (weekend coverage for Dr. Elnoria Howard)  History:  Notes and events of hospitalization reviewed. Dr. Elnoria Howard did upper endoscopy 2 days ago for attempted endoscopic control of bleeding ulcer at G-tube site, though control was unsuccessful.  Oral anticoagulation was stopped.  Surgery saw patient with no intervention planned.  Since Dr. Russella Dar saw this patient yesterday, she was extubated earlier this morning and Dr. Delton Coombes had just come by to see the patient when I was there.  Looks like her respiratory status is stable since extubation.  NG tube was placed while she was intubated and remains in narrow draining ileus fluid.  There is still leakage of some partially clotted maroon blood from the G-tube site into a collection bag that was placed yesterday.  There is presently about 30-50 cc of blood and fluid in there.  ROS: Cardiovascular: Currently denies chest pain No cough.  Able to give brief verbal replies.  No accessory muscle use. Objective:   Current Facility-Administered Medications:    0.9 %  sodium chloride infusion (Manually program via Guardrails IV Fluids), , Intravenous, Once, Dorcas Carrow, MD   acetaminophen (TYLENOL) suppository 650 mg, 650 mg, Rectal, Q6H PRN, Darreld Mclean R, MD, 650 mg at 08/08/23 2209   alum & mag hydroxide-simeth (MAALOX/MYLANTA) 200-200-20 MG/5ML suspension 30 mL, 30 mL, Per Tube, Q6H PRN, Dahal, Binaya, MD, 30 mL at 08/01/23 0616   ceFEPIme (MAXIPIME) 2 g in sodium chloride 0.9 % 100 mL IVPB, 2 g, Intravenous, Q8H, Pham, Anh P, RPH, Stopped at 08/10/23 0651   Chlorhexidine Gluconate Cloth 2 % PADS 6 each, 6 each, Topical, Daily, Dahal, Binaya, MD, 6 each at 08/09/23 1420   docusate (COLACE) 50 MG/5ML liquid 100 mg, 100 mg, Per Tube, BID, Leslye Peer, MD, 100 mg at 08/10/23 0959   dorzolamide-timolol (COSOPT) 2-0.5 % ophthalmic solution 1 drop, 1 drop, Both Eyes, BID, Dahal, Binaya, MD, 1 drop at  08/09/23 2230   famotidine (PEPCID) tablet 20 mg, 20 mg, Oral, QHS, Robertson, Crystal S, RPH, 20 mg at 08/07/23 2131   TPN (CLINIMIX-E) Adult, , Intravenous, Continuous TPN **AND** fat emulsion 20 % (SMOFLIPID) infusion, 250 mL, Intravenous, Continuous TPN, Fayrene Fearing, Melissa, RPH   feeding supplement (BOOST / RESOURCE BREEZE) liquid 1 Container, 1 Container, Oral, BID BM, Dorcas Carrow, MD, 1 Container at 08/08/23 1034   fentaNYL (SUBLIMAZE) bolus via infusion 25-100 mcg, 25-100 mcg, Intravenous, Q15 min PRN, Leslye Peer, MD, 50 mcg at 08/10/23 0434   fentaNYL in NS (44mcg/ml) infusion-PREMIX, 25-200 mcg/hr, Intravenous, Continuous, Byrum, Les Pou, MD, Last Rate: 2.5 mL/hr at 08/10/23 0800, 25 mcg/hr at 08/10/23 0800   hydrALAZINE (APRESOLINE) injection 10 mg, 10 mg, Intravenous, Q4H PRN, Darreld Mclean R, MD   HYDROmorphone (DILAUDID) injection 0.5 mg, 0.5 mg, Intravenous, Q3H PRN, Darreld Mclean R, MD, 0.5 mg at 08/02/23 0046   insulin aspart (novoLOG) injection 0-9 Units, 0-9 Units, Subcutaneous, Q6H, Hessie Knows, RPH, 1 Units at 08/10/23 0006   latanoprost (XALATAN) 0.005 % ophthalmic solution 1 drop, 1 drop, Both Eyes, QHS, Dahal, Binaya, MD, 1 drop at 08/09/23 2231   levothyroxine (SYNTHROID) tablet 50 mcg, 50 mcg, Oral, Q0600, Rhetta Mura, MD, 50 mcg at 08/08/23 0516   lip balm (CARMEX) ointment, , Topical, PRN, Dahal, Binaya, MD   magnesium sulfate IVPB 2 g 50 mL, 2 g, Intravenous, Once, Byrum, Les Pou, MD   metoprolol tartrate (LOPRESSOR) injection 5 mg, 5 mg,  Intravenous, Q4H PRN, Charlsie Quest, MD   metoprolol tartrate (LOPRESSOR) injection 5 mg, 5 mg, Intravenous, Once, Concow Nation, MD   midazolam (VERSED) injection 1-2 mg, 1-2 mg, Intravenous, Q1H PRN, Delton Coombes, Les Pou, MD   multivitamin liquid 15 mL, 15 mL, Oral, Daily, Cindi Carbon, RPH, 15 mL at 08/10/23 1001   ondansetron (ZOFRAN) injection 4 mg, 4 mg, Intravenous, Q6H PRN, Charlsie Quest, MD, 4 mg  at 08/08/23 1033   Oral care mouth rinse, 15 mL, Mouth Rinse, Q2H, Byrum, Les Pou, MD, 15 mL at 08/10/23 1610   Oral care mouth rinse, 15 mL, Mouth Rinse, PRN, Leslye Peer, MD   pantoprazole (PROTONIX) injection 40 mg, 40 mg, Intravenous, BID AC, Cherylin Mylar, RPH, 40 mg at 08/10/23 0957   polyethylene glycol (MIRALAX / GLYCOLAX) packet 17 g, 17 g, Oral, Daily, Danford, Earl Lites, MD, 17 g at 08/10/23 0959   potassium chloride 10 mEq in 100 mL IVPB, 10 mEq, Intravenous, Q1 Hr x 6, Byrum, Les Pou, MD   prochlorperazine (COMPAZINE) injection 5 mg, 5 mg, Intravenous, Q6H PRN, Dahal, Melina Schools, MD, 5 mg at 08/08/23 0516   senna (SENOKOT) tablet 8.6 mg, 1 tablet, Oral, Daily, Danford, Earl Lites, MD, 8.6 mg at 08/10/23 0958   simethicone (MYLICON) chewable tablet 80 mg, 80 mg, Oral, Q6H PRN, Anthoney Harada, NP, 80 mg at 08/05/23 1830   sodium chloride flush (NS) 0.9 % injection 10 mL, 10 mL, Intravenous, Q12H, Byrum, Les Pou, MD, 10 mL at 08/09/23 2229   sodium chloride flush (NS) 0.9 % injection 10-40 mL, 10-40 mL, Intracatheter, Q12H, Samtani, Jai-Gurmukh, MD, 10 mL at 08/09/23 2229   sodium chloride flush (NS) 0.9 % injection 10-40 mL, 10-40 mL, Intracatheter, PRN, Rhetta Mura, MD, 10 mL at 08/01/23 1758   TPN (CLINIMIX-E) Adult, , Intravenous, Continuous TPN, Lynden Ang, [facility], Last Rate: 40 mL/hr at 08/10/23 0800, Infusion Verify at 08/10/23 0800   ceFEPime (MAXIPIME) IV Stopped (08/10/23 9604)   TPN (CLINIMIX-E) Adult     And   fat emul(SMOFlipid)     fentaNYL infusion INTRAVENOUS 25 mcg/hr (08/10/23 0800)   magnesium sulfate bolus IVPB     potassium chloride     TPN (CLINIMIX-E) Adult 40 mL/hr at 08/10/23 0800     Vital signs in last 24 hrs: Vitals:   08/10/23 0759 08/10/23 1000  BP:  (!) 151/52  Pulse:    Resp:  (!) 29  Temp:  (!) 100.4 F (38 C)  SpO2: 100% 100%    Intake/Output Summary (Last 24 hours) at 08/10/2023 1006 Last data filed at 08/10/2023  0800 Gross per 24 hour  Intake 2523.68 ml  Output 2700 ml  Net -176.32 ml     Physical Exam Chronically ill-appearing woman NG tube draining fluid as noted above Cardiac: Tachycardic Pulm: Increased respiratory rate, good air entry bilaterally Abdomen: Softly distended, decreased bowel sounds.  G-tube collection bag described above Recent Labs:     Latest Ref Rng & Units 08/09/2023    3:40 PM 08/09/2023    6:53 AM 08/08/2023   11:27 PM  CBC  WBC 4.0 - 10.5 K/uL   8.5   Hemoglobin 12.0 - 15.0 g/dL 9.6  54.0  9.7   Hematocrit 36.0 - 46.0 % 30.2  31.2  30.3   Platelets 150 - 400 K/uL   102     Recent Labs  Lab 08/08/23 2215  INR 1.0      Latest Ref Rng &  Units 08/09/2023    1:39 AM 08/08/2023   11:27 PM 08/08/2023   10:15 PM  CMP  Glucose 70 - 99 mg/dL 161  096  045   BUN 8 - 23 mg/dL 43  38  QUANTITY NOT SUFFICIENT, UNABLE TO PERFORM TEST   Creatinine 0.44 - 1.00 mg/dL 4.09  8.11  QUANTITY NOT SUFFICIENT, UNABLE TO PERFORM TEST   Sodium 135 - 145 mmol/L 132  135  133   Potassium 3.5 - 5.1 mmol/L 3.2  4.0  4.2   Chloride 98 - 111 mmol/L 110  111  107   CO2 22 - 32 mmol/L 14  15  16    Calcium 8.9 - 10.3 mg/dL 7.7  7.7  8.8   Total Protein 6.5 - 8.1 g/dL 5.0  5.0  6.9   Total Bilirubin 0.3 - 1.2 mg/dL 1.4  1.2  1.8   Alkaline Phos 38 - 126 U/L 104  112  149   AST 15 - 41 U/L 13  14  20    ALT 0 - 44 U/L 18  20  25       Radiologic studies: KUB from today personally reviewed-report states that G-tube sideport is in the stomach.  There is a candycane shaped curve at the end of the tube.  CT angiogram was done yesterday to look for active bleeding (none seen), but the NG tube had not been placed at that point   Assessment & Plan  Assessment: Upper GI bleed from gastrostomy site ulcer, failed endoscopic control.  Bleeding seems to have significantly slowed after that procedure and with more time away from anticoagulation.  However, I am concerned that she is still  slowly bleeding from the site as evidenced by what is coming into the collection bag.  While there is only bilious fluid in the NG tube drainage, and though the KUB says the side drainage port is in the stomach, it is possible that the tip might actually be in the duodenum.  I also just noticed that her last hemoglobin and hematocrit was at 3 PM yesterday.   Plan: I messaged Dr. Delton Coombes, and he will order every 8 hour hemoglobin and hematocrit starting now.  Ultimate plan for this G-tube site and possible placement of another G-tube for the original goal of venting her obstruction from peritoneal carcinomatosis is yet unclear.  This will need to be navigated by Dr. Jacques Navy tomorrow in conjunction with interventional radiology. It would seem that the current G-tube site is still open since it is draining bloody fluid, and consideration should be given to placing an endoscopic over the scope clip to both close the site and stop the ulcer bleeding.  Then there would be the option of placing a new G-tube with a mushroom type internal bumper endoscopically or ask IR to place a new one depending on the available surface area in relation to the current problematic G-tube site. Continue close monitoring, signed and will be given to Dr. Elnoria Howard service this evening to assume care tomorrow.   40 minutes were spent on this encounter (including chart review, history/exam, counseling/coordination of care, and documentation) > 50% of that time was spent on counseling and coordination of care.    Charlie Pitter III Office: [phone]

## 2023-08-11 DIAGNOSIS — C55 Malignant neoplasm of uterus, part unspecified: Secondary | ICD-10-CM | POA: Diagnosis not present

## 2023-08-11 DIAGNOSIS — C786 Secondary malignant neoplasm of retroperitoneum and peritoneum: Secondary | ICD-10-CM

## 2023-08-11 DIAGNOSIS — E440 Moderate protein-calorie malnutrition: Secondary | ICD-10-CM | POA: Diagnosis not present

## 2023-08-11 DIAGNOSIS — K567 Ileus, unspecified: Secondary | ICD-10-CM | POA: Diagnosis not present

## 2023-08-11 LAB — PHOSPHORUS: Phosphorus: 3 mg/dL (ref 2.5–4.6)

## 2023-08-11 LAB — COMPREHENSIVE METABOLIC PANEL
ALT: 14 U/L (ref 0–44)
AST: 11 U/L — ABNORMAL LOW (ref 15–41)
Albumin: 2.4 g/dL — ABNORMAL LOW (ref 3.5–5.0)
Alkaline Phosphatase: 119 U/L (ref 38–126)
Anion gap: 5 (ref 5–15)
BUN: 30 mg/dL — ABNORMAL HIGH (ref 8–23)
CO2: 18 mmol/L — ABNORMAL LOW (ref 22–32)
Calcium: 9.1 mg/dL (ref 8.9–10.3)
Chloride: 110 mmol/L (ref 98–111)
Creatinine, Ser: 0.76 mg/dL (ref 0.44–1.00)
GFR, Estimated: >60 mL/min (ref >60)
Glucose, Bld: 169 mg/dL — ABNORMAL HIGH (ref 70–99)
Potassium: 3.3 mmol/L — ABNORMAL LOW (ref 3.5–5.1)
Sodium: 133 mmol/L — ABNORMAL LOW (ref 135–145)
Total Bilirubin: 0.5 mg/dL (ref 0.3–1.2)
Total Protein: 5.8 g/dL — ABNORMAL LOW (ref 6.5–8.1)

## 2023-08-11 LAB — CBC
HCT: 26 % — ABNORMAL LOW (ref 36.0–46.0)
HCT: 26 % — ABNORMAL LOW (ref 36.0–46.0)
Hemoglobin: 8.3 g/dL — ABNORMAL LOW (ref 12.0–15.0)
Hemoglobin: 8.3 g/dL — ABNORMAL LOW (ref 12.0–15.0)
MCH: 30.3 pg (ref 26.0–34.0)
MCH: 30.2 pg (ref 26.0–34.0)
MCHC: 31.9 g/dL (ref 30.0–36.0)
MCHC: 31.9 g/dL (ref 30.0–36.0)
MCV: 94.9 fL (ref 80.0–100.0)
MCV: 94.5 fL (ref 80.0–100.0)
Platelets: 166 10*3/uL (ref 150–400)
Platelets: 183 10*3/uL (ref 150–400)
RBC: 2.74 MIL/uL — ABNORMAL LOW (ref 3.87–5.11)
RBC: 2.75 MIL/uL — ABNORMAL LOW (ref 3.87–5.11)
RDW: 16.5 % — ABNORMAL HIGH (ref 11.5–15.5)
RDW: 16.7 % — ABNORMAL HIGH (ref 11.5–15.5)
WBC: 18.7 10*3/uL — ABNORMAL HIGH (ref 4.0–10.5)
WBC: 16.8 10*3/uL — ABNORMAL HIGH (ref 4.0–10.5)
nRBC: 0 % (ref 0.0–0.2)
nRBC: 0 % (ref 0.0–0.2)

## 2023-08-11 LAB — GLUCOSE, CAPILLARY
Glucose-Capillary: 127 mg/dL — ABNORMAL HIGH (ref 70–99)
Glucose-Capillary: 130 mg/dL — ABNORMAL HIGH (ref 70–99)
Glucose-Capillary: 146 mg/dL — ABNORMAL HIGH (ref 70–99)
Glucose-Capillary: 154 mg/dL — ABNORMAL HIGH (ref 70–99)

## 2023-08-11 LAB — TRIGLYCERIDES: Triglycerides: 216 mg/dL — ABNORMAL HIGH (ref <150)

## 2023-08-11 LAB — MAGNESIUM: Magnesium: 2 mg/dL (ref 1.7–2.4)

## 2023-08-11 MED ORDER — LORAZEPAM 2 MG/ML IJ SOLN
1.0000 mg | Freq: Once | INTRAMUSCULAR | Status: DC
  Filled 2023-08-11: qty 1

## 2023-08-11 MED ORDER — POTASSIUM CHLORIDE 10 MEQ/50ML IV SOLN: 10.0000 meq | INTRAVENOUS | Status: DC

## 2023-08-11 MED ORDER — DEXMEDETOMIDINE HCL IN NACL 200 MCG/50ML IV SOLN
0.1000 ug/kg/h | INTRAVENOUS | Status: DC
  Administered 2023-08-11: 0.2 ug/kg/h via INTRAVENOUS
  Administered 2023-08-11 (×2): 0.4 ug/kg/h via INTRAVENOUS
  Filled 2023-08-11 (×4): qty 50

## 2023-08-11 MED ORDER — FAT EMUL FISH OIL/PLANT BASED 20% (SMOFLIPID)IV EMUL
250.0000 mL | INTRAVENOUS | Status: AC
  Administered 2023-08-11: 250 mL via INTRAVENOUS
  Filled 2023-08-11: qty 250

## 2023-08-11 MED ORDER — TRACE MINERALS CU-MN-SE-ZN 300-55-60-3000 MCG/ML IV SOLN
INTRAVENOUS | Status: AC
  Filled 2023-08-11 (×2): qty 1000

## 2023-08-11 MED ORDER — POTASSIUM CHLORIDE 10 MEQ/100ML IV SOLN
10.0000 meq | INTRAVENOUS | Status: AC
  Administered 2023-08-11 (×5): 10 meq via INTRAVENOUS
  Filled 2023-08-11 (×5): qty 100

## 2023-08-11 NOTE — Progress Notes (Signed)
Progress Note  3 Days Post-Op  Subjective: Drowsy from sedation. Denies pain   Objective: Vital signs in last 24 hours: Temp:  [99.3 F (37.4 C)-100.9 F (38.3 C)] 99.5 F (37.5 C) (10/14 0600) Pulse Rate:  [73-122] 73 (10/14 0600) Resp:  [20-36] 24 (10/14 0600) BP: (93-166)/(34-98) 106/34 (10/14 0600) SpO2:  [100 %] 100 % (10/14 0600) Weight:  [79.2 kg] 79.2 kg (10/14 0500) Last BM Date : 08/06/23  Intake/Output from previous day: 10/13 0701 - 10/14 0700 In: 1958.3 [I.V.:1225.5; NG/GT:150; IV Piggyback:582.8] Out: 4675 [Urine:3175; Emesis/NG output:1500] Intake/Output this shift: Total I/O In: 221.4 [I.V.:121.4; IV Piggyback:100] Out: -   PE: General: pleasant, WD, female who is laying in bed in NAD Lungs: Respiratory effort nonlabored Abd: soft, NT, NGT and G tube back with bilious nonbloody output MSK: all 4 extremities are symmetrical with no cyanosis, clubbing, or edema. Skin: warm and dry   Lab Results:  Recent Labs    08/10/23 1817 08/11/23 0304  WBC 22.8* 18.7*  HGB 9.7* 8.3*  HCT 30.4* 26.0*  PLT 166 166   BMET Recent Labs    08/10/23 0927 08/11/23 0651  NA 133* 133*  K 3.5 3.3*  CL 107 110  CO2 18* 18*  GLUCOSE 142* 169*  BUN 40* 30*  CREATININE 0.81 0.76  CALCIUM 9.1 9.1   PT/INR Recent Labs    08/08/23 2215  LABPROT 13.8  INR 1.0   CMP     Component Value Date/Time   NA 133 (L) 08/11/2023 0651   NA 140 03/25/2023 1541   K 3.3 (L) 08/11/2023 0651   CL 110 08/11/2023 0651   CO2 18 (L) 08/11/2023 0651   GLUCOSE 169 (H) 08/11/2023 0651   GLUCOSE 93 09/10/2006 0834   BUN 30 (H) 08/11/2023 0651   BUN 12 03/25/2023 1541   CREATININE 0.76 08/11/2023 0651   CREATININE 0.87 07/10/2023 1331   CREATININE 0.64 07/13/2014 1621   CALCIUM 9.1 08/11/2023 0651   CALCIUM 9.7 03/29/2010 1554   PROT 5.8 (L) 08/11/2023 0651   PROT 7.6 11/09/2020 1156   ALBUMIN 2.4 (L) 08/11/2023 0651   ALBUMIN 4.4 11/09/2020 1156   AST 11 (L)  08/11/2023 0651   AST 12 (L) 07/10/2023 1331   ALT 14 08/11/2023 0651   ALT 12 07/10/2023 1331   ALKPHOS 119 08/11/2023 0651   BILITOT 0.5 08/11/2023 0651   BILITOT 0.3 07/10/2023 1331   GFRNONAA >60 08/11/2023 0651   GFRNONAA >60 07/10/2023 1331   GFRNONAA >89 07/13/2014 1621   GFRAA 86 11/09/2020 1156   GFRAA >89 07/13/2014 1621   Lipase     Component Value Date/Time   LIPASE 47 07/14/2023 1334       Studies/Results: DG Abd Portable 1V  Result Date: 08/09/2023 CLINICAL DATA:  Encounter for nasogastric tube placement. EXAM: PORTABLE ABDOMEN - 1 VIEW COMPARISON:  CT angio abdomen 08/09/2023 FINDINGS: Side port of the NG tube is in the stomach. Bowel gas pattern is unremarkable. The lung bases are clear. IMPRESSION: Side port of the NG tube is in the stomach. Electronically Signed   By: Marin Roberts M.D.   On: 08/09/2023 15:30   CT ANGIO ABDOMEN W &/OR WO CONTRAST  Result Date: 08/09/2023 CLINICAL DATA:  History of endometrial/uterine cancer. Head displacement. GI bleeding. * Tracking Code: BO * EXAM: CT ANGIOGRAPHY ABDOMEN TECHNIQUE: Multidetector CT imaging of the abdomen was performed using the standard protocol during bolus administration of intravenous contrast. Multiplanar reconstructed  images and MIPs were obtained and reviewed to evaluate the vascular anatomy. RADIATION DOSE REDUCTION: This exam was performed according to the departmental dose-optimization program which includes automated exposure control, adjustment of the mA and/or kV according to patient size and/or use of iterative reconstruction technique. CONTRAST:  OMNIPAQUE IOHEXOL 350 MG/ML SOLN COMPARISON:  07/12/2023. FINDINGS: VASCULAR Aorta: Normal caliber aorta without aneurysm, dissection, vasculitis or significant stenosis. Mild aortic atherosclerotic calcifications. Celiac: Patent without evidence of aneurysm, dissection, vasculitis or significant stenosis. SMA: Patent without evidence of aneurysm,  dissection, vasculitis or significant stenosis. Renals: Both renal arteries are patent without evidence of aneurysm, dissection, vasculitis, fibromuscular dysplasia or significant stenosis. Calcifications are noted at the origin of bilateral renal arteries. IMA: Patent without evidence of aneurysm, dissection, vasculitis or significant stenosis. Inflow: Patent without evidence of aneurysm, dissection, vasculitis or significant stenosis. Veins: No obvious venous abnormality within the limitations of this arterial phase study. Review of the MIP images confirms the above findings. NON-VASCULAR Lower chest: Atelectasis is identified within both lung bases. Trace left pleural effusion. Hepatobiliary: No suspicious liver lesion identified. Recent gastrostomy tube tract is noted within the ventral abdomen through the far lateral aspect of the left hepatic lobe, image 37/8. Here there are 2 adjacent cervical trigger clips. Gallbladder appears within normal limits. No bile duct dilatation. Pancreas: Unremarkable. No pancreatic ductal dilatation or surrounding inflammatory changes. Spleen: Normal in size without focal abnormality. Adrenals/Urinary Tract: Normal adrenal glands. No nephrolithiasis, hydronephrosis or suspicious mass. Foley catheter identified within the bladder. No focal bladder abnormality. Stomach/Bowel: Two T-tacks are identified at the gastrostomy tube site along the anterior gastric wall, image 39/8. No signs of active intraluminal contrast extravasation into the stomach to indicate active gastric bleeding. Likewise, there is no definite intraluminal contrast extravasation within the large or small bowel loops to indicates active GI bleeding. Moderate retained stool is identified within the colon. Scattered colonic diverticula noted without signs of acute diverticulitis. No pathologic dilatation of the large or small bowel loops to suggest an obstruction. Multiple interloop small bowel fluid collections are  identified as noted previously which are concerning for peritoneal disease. Lymphatic: Aortic atherosclerosis. No aneurysm. Upper abdominal vascularity appears patent. No signs of abdominopelvic adenopathy. Reproductive: Uterus is absent. Soft tissue nodule associated with the left side of the vaginal cough is similar to the previous exam measuring 2.4 cm, image 74/8. Previously this measured the same. Other: Peritoneal soft tissue stranding and omental stranding is again noted concerning for peritoneal disease. This appears similar to the previous study. As mentioned above there are multiple fluid density structures which appear closely associated with the wall of the small bowel loops, similar in appearance to the previous exam. These are concerning for possible peritoneal deposits. Index fluid density structure within the central mesentery measures 2.1 cm, image 57/8. Formally 2.3 cm. Fluid collection associated with a left abdominal small bowel loop measures 3.4 x 2.6 cm, image 52/8. Formally this measured 2.6 by 2.2 cm. There is mild fluid identified extending along the lefta pericolic gutter into the left retroperitoneal reflection which is new from previous study, image 62/8. Musculoskeletal: No acute or significant osseous findings. No acute or suspicious osseous findings. IMPRESSION: 1. No signs of active intraluminal contrast extravasation into the stomach to indicate active gastric bleeding. Likewise, there is no definite intraluminal contrast extravasation within the large or small bowel loops to indicates active GI bleeding. 2. Non-vascular. Peritoneal soft tissue stranding mild nodularity is again noted concerning for peritoneal disease. This  appears similar to the previous exam. There are multiple fluid density structures which appear closely associated with the wall of the small bowel loops, similar in appearance to the previous exam. These are concerning for mucinous peritoneal deposits. 3. There is  mild fluid identified extending along the left pericolic gutter into the left retroperitoneal reflection which is new from previous study. 4. Soft tissue nodule associated with the left side of the vaginal cough is similar to the previous exam. 5. Trace left pleural effusion. 6.  Aortic Atherosclerosis (ICD10-I70.0). Electronically Signed   By: Signa Kell M.D.   On: 08/09/2023 13:46    Anti-infectives: Anti-infectives (From admission, onward)    Start     Dose/Rate Route Frequency Ordered Stop   08/09/23 1000  vancomycin (VANCOCIN) IVPB 1000 mg/200 mL premix  Status:  Discontinued        1,000 mg 200 mL/hr over 60 Minutes Intravenous Every 12 hours 08/08/23 2132 08/10/23 0807   08/09/23 0600  ceFEPIme (MAXIPIME) 2 g in sodium chloride 0.9 % 100 mL IVPB        2 g 200 mL/hr over 30 Minutes Intravenous Every 8 hours 08/08/23 2132     08/08/23 2230  vancomycin (VANCOREADY) IVPB 1750 mg/350 mL        1,750 mg 175 mL/hr over 120 Minutes Intravenous  Once 08/08/23 2122 08/09/23 0117   08/08/23 2215  vancomycin (VANCOCIN) IVPB 1000 mg/200 mL premix  Status:  Discontinued        1,000 mg 200 mL/hr over 60 Minutes Intravenous  Once 08/08/23 2117 08/08/23 2122   08/08/23 2130  ceFEPIme (MAXIPIME) 2 g in sodium chloride 0.9 % 100 mL IVPB        2 g 200 mL/hr over 30 Minutes Intravenous NOW 08/08/23 2117 08/08/23 2309   07/31/23 1000  ceFAZolin (ANCEF) IVPB 2g/100 mL premix        2 g 200 mL/hr over 30 Minutes Intravenous On call 07/31/23 0912 07/31/23 1126   07/27/23 1800  micafungin (MYCAMINE) 100 mg in sodium chloride 0.9 % 100 mL IVPB  Status:  Discontinued        100 mg 105 mL/hr over 1 Hours Intravenous Every 24 hours 07/27/23 1718 08/01/23 1404        Assessment/Plan Upper GI bleed due to gastrostomy site erosion S/p upper endoscopy 10/11 Dr. Elnoria Howard which failed to stop the bleeding Carcinomatosis PAF on xarelto   She remains off of pressors. Hemoglobin 8.3 from 9.7 and do not see  signs of overt bleeding this am  Surgical intervention would be high risk given carcinomatosis and not indicated at this time  We will sign off but please do not hesitate to contact us with any questions or concerns or reconsult if needed.   FEN: NPO ID: [email or web address] VTE: xarelto held in setting of bleeding  I reviewed Consultant GI notes, hospitalist notes, last 24 h vitals and pain scores, last 48 h intake and output, last 24 h labs and trends, and last 24 h imaging results.    LOS: 28 days   Eric Form, [facility] 08/11/2023, 10:26 AM Please see Amion for pager number during day hours 7:00am-4:30pm

## 2023-08-11 NOTE — Anesthesia Postprocedure Evaluation (Signed)
Anesthesia Post Note  Patient: Sherri Gill  Procedure(s) Performed: ESOPHAGOGASTRODUODENOSCOPY (EGD) WITH PROPOFOL HEMOSTASIS CLIP PLACEMENT HEMOSTASIS CONTROL SCLEROTHERAPY     Patient location during evaluation: SICU Anesthesia Type: General Level of consciousness: sedated Pain management: pain level controlled Vital Signs Assessment: post-procedure vital signs reviewed and stable Respiratory status: patient remains intubated per anesthesia plan Cardiovascular status: stable Postop Assessment: no apparent nausea or vomiting Anesthetic complications: no Comments: Patient remained intubated and sedated for ongoing GI bleed. Hg 6.1 intra-op, requiring transfusion.    No notable events documented.  Last Vitals:  Vitals:   08/11/23 0900 08/11/23 1000  BP: (!) 109/46 (!) 98/39  Pulse: 71 65  Resp: (!) 29 (!) 24  Temp: 37.5 C 37.5 C  SpO2: 100% 100%    Last Pain:  Vitals:   08/11/23 0800  TempSrc:   PainSc: 0-No pain                 Brisbin Nation

## 2023-08-11 NOTE — Progress Notes (Signed)
PT Cancellation Note  Patient Details Name: Sherri Gill MRN: [id number] DOB: 1951-04-17   Cancelled Treatment:    Reason Eval/Treat Not Completed: Medical issues which prohibited therapy, noted GOC tentatively planned for today. Will follow for indication for P needs.[name] PT Acute Rehabilitation Services Office [phone] Weekend [phone]    [name] 08/11/2023, 2:48 PM

## 2023-08-11 NOTE — Progress Notes (Signed)
NAME:  Sherri Gill, MRN:  [id number], DOB:  [birth year], LOS: 28 ADMISSION DATE:  07/28/2023, CONSULTATION DATE: 07/29/2023 REFERRING MD: Dr. Elnoria Howard, CHIEF COMPLAINT: Acute respiratory failure, shock  History of Present Illness:  72 year old woman with a history of recurrent stage IV uterine cancer with peritoneal carcinomatosis on chemotherapy per Dr. [name], atrial fibrillation on Xarelto, diabetes, hypertension.  She is admitted with refractory ileus due to her intra-abdominal tumor burden.  She has been on TPN, had a PEG placed on 10/3.  On 10/11 she started bleeding for the PEG tube insertion site and had hematemesis.  Xarelto held, last dose was evening 10/10.  She went urgently for EGD 10/11> oozing gastric ulcer with active bleeding at the site of PEG tube insertion, unable to achieve hemostasis with clips and Hemospray.  Return to the ICU intubated, sedated and on low-dose phenylephrine.  Pertinent  Medical History   Past Medical History:  Diagnosis Date   AKI (acute kidney injury) (HCC)    History of   Ascites    09/2021 paracentesis with almost 3 L of fluid 12/2021 Paracentesis performed with 2.8 L   Carpal tunnel syndrome 02/11/2014   Chronic anemia 08/29/2012   Need colonoscopy or report. Need iron panel.    Dyslipidemia 06/30/2007   Dyspnea    Essential hypertension, benign 06/30/2007   GERD 06/30/2007   History of blood transfusion    History of fatty infiltration of liver    History of migraine    Hx of Herpes simplex meningitis 2015   Also noted to have primary empty sella on imaging at this admission   Insomnia 06/11/2012   Major depressive disorder, recurrent episode, moderate with anxious distress (HCC) 06/30/2007   Nausea and vomiting 01/13/2022   Obesity, BMI 35-40 06/11/2012   Peripheral neuropathy 2/2 T2DM 12/28/2009   Primary empty sella syndrome (HCC) 2015   Noted on imaging during hospitalization for herpes meningitis; no pituitary mass, no hormone w/u at  that time, hormonally asymptomatic   Type 2 diabetes mellitus with neurological complications (HCC) 06/30/2007    Significant Hospital Events: Including procedures, antibiotic start and stop dates in addition to other pertinent events   EGD 10/11 >> actively bleeding gastric ulcer at PEG insertion site, unsuccessful clip and Hemospray.  To the ICU intubated and sedated 10/12 empiric antibiotics for fever 10/12 PEG tube came out.  NG tube placed for stomach decompression, suction 10/13 extubated   Interim History / Subjective:  Placed on precedex overnight for anxiety, only complaints of being sleepy, denies pain or nausea Back in NSR this am   Objective   Blood pressure (!) 106/34, pulse 73, temperature 99.5 F (37.5 C), resp. rate (!) 24, height 5\' 3"  (1.6 m), weight 79.2 kg, SpO2 100%.        Intake/Output Summary (Last 24 hours) at 08/11/2023 1003 Last data filed at 08/11/2023 6295 Gross per 24 hour  Intake 2120.16 ml  Output 4375 ml  Net -2254.84 ml   Filed Weights   08/09/23 0026 08/10/23 0432 08/11/23 0500  Weight: 77 kg 79.9 kg 79.2 kg    Examination: General:  AoC ill appearing older female resting in bed in NAD HEENT: MM pink/dry, L NGT- bilious, pupils 3/r Neuro: awakens to verbal, oriented to self, place, follow simple commands, generalized weakness CV: rr, NSR, no murmur, port right chest accessed PULM:  non labored, clear anteriorly, diminished in bases GI: soft, few bs on left, minimal dark secretions from PEG site, no active  bleeding, NT, foley- cyu Extremities: warm/dry, no pitting LE edema  Skin: no rashes   No BM since 10/10 Afebrile Labs reviewed Na 133, K 3.3, bicarb 18, sCr 0.76, triglycerides 216, WBC 22.8> 18.7, H/H 9.7/ 30.4> 8.3/ 26 UOP 3.1L/ 24hrs OGT output 1.5/ 24hrs (~700-854ml q 12hr) Net +14.4L  Resolved Hospital Problem list   Leukopenia Thrombocytopenia  Assessment & Plan:   Acute upper GI bleeding due to gastric ulcer/erosion  at PEG site.  Hemostasis not achieved on EGD 10/11.  Bleeding stabilized with reversal Xarelto.  PEG out on 10/12, s/p NGT P:  - NGT remains ILWS, remains bilious but 700-800 ml output q12hrs - cont PPI q 12 - cont venting of PEG> no evidence of active bleed - Surgery signed off today, not a surgical candidate given metastatic carcinomatosis recs to consider IR embolization if re-bleeding - [facility] following, appreciate input - trending H/H q8hrs, transfuse for Hgb < 7 - send repeat T&S 10/15 - cont to hold PTA xarelto - cont supportive care - ongoing GOC  Acute respiratory failure due to hemodynamic instability, active GI bleeding, post EGD - extubated 10/13, stable respiratory  - wean supplemental O2 for sat goal > 92% - aggressive pulm hygiene - minimize sedatives> attempt to wean off precedex  - delirium precautions - remains NPO, aspiration precautions  Fever/ SIRS - ?related to blood products vs warmer used.  WBC up 8.5, peaked 22.8 on 10/13 and continues to down trend.  Afebrile overnight, UA neg - MAPs lower overnight after starting precedex, improving as weaning off.  Goal MAP > 65 - monitor NGT output> at risk for hypovolemia - would hold further cefepime.  If worsening condition, would change over to zosyn for broader abd coverage - follow BC's> remains ngtd  Refractory ileus  Uterine cancer with peritoneal carcinomatosis - Has poorly tolerated clamping G-tube, has required venting palliative PEG and TPN - cont TPN per pharmacy as remains NPO - Dr. [name] following.  Given current clinical status/condition and severe deconditioning, chemotherapy on hold for now - ongoing GOC.  PMT meeting this afternoon given overall poor prognosis   Hypokalemia - Mag 2, 4 runs KCL ordered - replete for goal > 4  Hypothyroidism - change PO to IV levothyroxine  Paroxysmal atrial fibrillation - cont tele monitoring - cont to hold Diltiazem and metoprolol with soft BP -  optimize electrolytes - Xarelto on hold given above  T2DM - cont SSI sensitive  Deconditioning - PT as tolerated - nutrition via TPN for now  Best Practice (right click and "Reselect all SmartList Selections" daily)   Diet/type: NPO; TPN per pharmacy DVT prophylaxis: SCD GI prophylaxis: PPI Lines: N/A- port in right chest Foley:  Yes, and it is still needed Code Status:  full code Last date of multidisciplinary goals of care discussion [pending] Family: Discussed status, plans with the patient's daughter at bedside on 10/12, 10/13  Pending 10/14.    Labs   CBC: Recent Labs  Lab 08/06/23 0559 07/30/2023 1216 07/29/2023 2327 08/09/23 0653 08/09/23 1540 08/10/23 0927 08/10/23 1817 08/11/23 0304  WBC 8.6  --  8.5  --   --  20.8* 22.8* 18.7*  NEUTROABS  --   --  7.1  --   --   --   --   --   HGB 10.2*   < > 9.7* 10.3* 9.6* 9.0* 9.7* 8.3*  HCT 32.5*   < > 30.3* 31.2* 30.2* 27.8* 30.4* 26.0*  MCV 90.3  --  95.0  --   --  94.2 94.7 94.9  PLT 154  --  102*  --   --  144* 166 166   < > = values in this interval not displayed.    Basic Metabolic Panel: Recent Labs  Lab 08/07/23 0604 08/03/2023 0601 08/05/2023 1512 08/12/2023 2215 08/09/2023 2327 08/09/23 0139 08/10/23 0927 08/11/23 0651  NA 133* 130*   < > 133* 135 132* 133* 133*  K 4.0 3.8   < > 4.2 4.0 3.2* 3.5 3.3*  CL 104 99   < > 107 111 110 107 110  CO2 19* 21*  --  16* 15* 14* 18* 18*  GLUCOSE 138* 137*   < > 136* 155* 199* 142* 169*  BUN 27* 31*   < > QUANTITY NOT SUFFICIENT, UNABLE TO PERFORM TEST 38* 43* 40* 30*  CREATININE 0.72 0.75   < > QUANTITY NOT SUFFICIENT, UNABLE TO PERFORM TEST 0.78 0.82 0.81 0.76  CALCIUM 9.3 9.4  --  8.8* 7.7* 7.7* 9.1 9.1  MG 1.6* 1.5*  --   --   --  1.5* 2.2 2.0  PHOS 3.5 3.8  --   --   --   --   --  3.0   < > = values in this interval not displayed.   GFR: Estimated Creatinine Clearance: 65.2 mL/min (by C-G formula based on SCr of 0.76 mg/dL). Recent Labs  Lab 08/15/2023 2327  08/09/23 0139 08/10/23 0927 08/10/23 1817 08/11/23 0304  PROCALCITON 1.85  --   --   --   --   WBC 8.5  --  20.8* 22.8* 18.7*  LATICACIDVEN 1.8 1.1  --   --   --     Liver Function Tests: Recent Labs  Lab 08/15/2023 0601 08/19/2023 2215 08/13/2023 2327 08/09/23 0139 08/11/23 0651  AST 20 20 14* 13* 11*  ALT 40 25 20 18 14   ALKPHOS 229* 149* 112 104 119  BILITOT 0.4 1.8* 1.2 1.4* 0.5  PROT 7.1 6.9 5.0* 5.0* 5.8*  ALBUMIN 2.6* 3.7 2.7* 2.6* 2.4*   No results for input(s): "LIPASE", "AMYLASE" in the last 168 hours. No results for input(s): "AMMONIA" in the last 168 hours.  ABG    Component Value Date/Time   PHART 7.2 (L) 08/17/2023 1718   PCO2ART 46 08/09/2023 1718   PO2ART 138 (H) 08/07/2023 1718   HCO3 18.0 (L) 08/03/2023 1718   TCO2 18 (L) 08/28/2023 1512   ACIDBASEDEF 9.9 (H) 08/19/2023 1718   O2SAT 99.9 08/25/2023 1718     Coagulation Profile: Recent Labs  Lab 08/22/2023 2215  INR 1.0    Cardiac Enzymes: No results for input(s): "CKTOTAL", "CKMB", "CKMBINDEX", "TROPONINI" in the last 168 hours.  HbA1C: Hemoglobin A1C  Date/Time Value Ref Range Status  02/20/2023 10:32 AM 6.4 (A) 4.0 - 5.6 % Final  08/22/2022 09:21 AM 6.0 (A) 4.0 - 5.6 % Final   Hgb A1c MFr Bld  Date/Time Value Ref Range Status  04/05/2022 01:30 PM 6.5 (H) 4.8 - 5.6 % Final    Comment:    (NOTE) Pre diabetes:          5.7%-6.4%  Diabetes:              >6.4%  Glycemic control for   <7.0% adults with diabetes   12/29/2021 05:40 AM 6.7 (H) 4.8 - 5.6 % Final    Comment:    (NOTE) Pre diabetes:          5.7%-6.4%  Diabetes:              >6.4%  Glycemic control for   <7.0% adults with diabetes     CBG: Recent Labs  Lab 08/10/23 1140 08/10/23 1713 08/10/23 1827 08/10/23 2344 08/11/23 0553  GLUCAP 153* 112* 122* 143* 154*     Critical care time: 35 min       Posey Boyer, MSN, AG-ACNP-BC  Pulmonary & Critical Care 08/11/2023, 10:04 AM  See Amion for  pager If no response to pager , please call [phone] until 7pm After 7:00 pm call Elink  [phone]

## 2023-08-11 NOTE — Progress Notes (Addendum)
eLink Physician-Brief Progress Note Patient Name: Sherri Gill DOB: Nov 01, 1950 MRN: [id number]   Date of Service  08/11/2023  HPI/Events of Note  Camera eval done for anxiety. Bed side asking for ativan, earlier helped a lot. S/p extubation status, stable a fib, rate controlled. Mental status- able to protect her airways. Has NG tube-no bleeding. VS stable.   eICU Interventions  Ativan IV ordered once. Asp precautions.      Intervention Category Intermediate Interventions: Arrhythmia - evaluation and management;Other:  Ranee Gosselin 08/11/2023, 1:11 AM  01:49 Concerned about giving ativan for anxiety due to asp risk.  HR 90. MAP good. Discussed with RN.  She underwent urgent EGD and remained intubated after the procedure. She was extubated earlier today 10/13.  - ordered low dose precedex drip.

## 2023-08-11 NOTE — Progress Notes (Signed)
Sherri Gill   DOB:1951/04/02   WJ#:[id number]    ASSESSMENT & PLAN:  Recurrent uterine cancer She presents with partial small bowel obstruction She tolerated chemotherapy with carboplatin and paclitaxel 07/23/2023 Unfortunately, she developed complication with bleeding from the venting gastrostomy tube Currently, she is ill and incredibly debilitated  I have a long discussion with her daughter who is her medical power of attorney I did not have further discussion with the patient as she appears to be sedated this morning Overall, the patient is significantly deconditioned and had developed complications from medical treatment It is not clear to me that she is strong enough to undergo further chemotherapy at the end of the week I will continue to assess day by day but I think it is also prudent to have a realistic goals of care discussion and consideration to transition her care to comfort measures I addressed all questions from her daughter   Nausea, vomiting and dehydration, with persistent ileus She had venting gastrostomy tube placement on 10/3 NG tube was placed recently after intubation Currently n.p.o. status   Acquired pancytopenia Due to chemotherapy, she had received 1 unit of blood last weekend on October 4 Monitor closely, may need further blood transfusion support   Dehydration, mild hypokalemia and other electrolyte imbalance, severe malnutrition Continue total parenteral nutrition  Goals of care discussion I have a long discussion with her daughter and brought her up-to-date with all her treatment so far She understood that her mother is very ill and with organ dysfunction secondary to her cancer which may not be reversible even with further chemotherapy I strongly recommend consideration to transition her care to comfort measures as well as consideration to change her CODE STATUS to DNR   Discharge planning Unknown, will follow  All questions were answered. The  patient knows to call the clinic with any problems, questions or concerns.   The total time spent in the appointment was 55 minutes encounter with patients including review of chart and various tests results, discussions about plan of care and coordination of care plan  Artis Delay, MD 08/11/2023 8:34 AM  Subjective:  The patient appears sleeping and sedated.  Her daughter is by the bedside.  I have a long discussion with her daughter this morning and brought her up to speed of her current hospitalization so far Events over the weekend were reviewed and noted  Objective:  Vitals:   08/11/23 0500 08/11/23 0600  BP: (!) 93/37 (!) 106/34  Pulse: 73 73  Resp: (!) 28 (!) 24  Temp: 99.5 F (37.5 C) 99.5 F (37.5 C)  SpO2: 100% 100%     Intake/Output Summary (Last 24 hours) at 08/11/2023 0834 Last data filed at 08/11/2023 6213 Gross per 24 hour  Intake 2120.16 ml  Output 4375 ml  Net -2254.84 ml    GENERAL: Patient appears sleeping comfortably.    Labs:  Recent Labs    08/15/2023 2327 08/09/23 0139 08/10/23 0927 08/11/23 0651  NA 135 132* 133* 133*  K 4.0 3.2* 3.5 3.3*  CL 111 110 107 110  CO2 15* 14* 18* 18*  GLUCOSE 155* 199* 142* 169*  BUN 38* 43* 40* 30*  CREATININE 0.78 0.82 0.81 0.76  CALCIUM 7.7* 7.7* 9.1 9.1  GFRNONAA >60 >60 >60 >60  PROT 5.0* 5.0*  --  5.8*  ALBUMIN 2.7* 2.6*  --  2.4*  AST 14* 13*  --  11*  ALT 20 18  --  14  ALKPHOS 112 104  --  119  BILITOT 1.2 1.4*  --  0.5    Studies:  DG Abd Portable 1V  Result Date: 08/09/2023 CLINICAL DATA:  Encounter for nasogastric tube placement. EXAM: PORTABLE ABDOMEN - 1 VIEW COMPARISON:  CT angio abdomen 08/09/2023 FINDINGS: Side port of the NG tube is in the stomach. Bowel gas pattern is unremarkable. The lung bases are clear. IMPRESSION: Side port of the NG tube is in the stomach. Electronically Signed   By: Marin Roberts M.D.   On: 08/09/2023 15:30   CT ANGIO ABDOMEN W &/OR WO CONTRAST  Result  Date: 08/09/2023 CLINICAL DATA:  History of endometrial/uterine cancer. Head displacement. GI bleeding. * Tracking Code: BO * EXAM: CT ANGIOGRAPHY ABDOMEN TECHNIQUE: Multidetector CT imaging of the abdomen was performed using the standard protocol during bolus administration of intravenous contrast. Multiplanar reconstructed images and MIPs were obtained and reviewed to evaluate the vascular anatomy. RADIATION DOSE REDUCTION: This exam was performed according to the departmental dose-optimization program which includes automated exposure control, adjustment of the mA and/or kV according to patient size and/or use of iterative reconstruction technique. CONTRAST:  OMNIPAQUE IOHEXOL 350 MG/ML SOLN COMPARISON:  07/12/2023. FINDINGS: VASCULAR Aorta: Normal caliber aorta without aneurysm, dissection, vasculitis or significant stenosis. Mild aortic atherosclerotic calcifications. Celiac: Patent without evidence of aneurysm, dissection, vasculitis or significant stenosis. SMA: Patent without evidence of aneurysm, dissection, vasculitis or significant stenosis. Renals: Both renal arteries are patent without evidence of aneurysm, dissection, vasculitis, fibromuscular dysplasia or significant stenosis. Calcifications are noted at the origin of bilateral renal arteries. IMA: Patent without evidence of aneurysm, dissection, vasculitis or significant stenosis. Inflow: Patent without evidence of aneurysm, dissection, vasculitis or significant stenosis. Veins: No obvious venous abnormality within the limitations of this arterial phase study. Review of the MIP images confirms the above findings. NON-VASCULAR Lower chest: Atelectasis is identified within both lung bases. Trace left pleural effusion. Hepatobiliary: No suspicious liver lesion identified. Recent gastrostomy tube tract is noted within the ventral abdomen through the far lateral aspect of the left hepatic lobe, image 37/8. Here there are 2 adjacent cervical trigger  clips. Gallbladder appears within normal limits. No bile duct dilatation. Pancreas: Unremarkable. No pancreatic ductal dilatation or surrounding inflammatory changes. Spleen: Normal in size without focal abnormality. Adrenals/Urinary Tract: Normal adrenal glands. No nephrolithiasis, hydronephrosis or suspicious mass. Foley catheter identified within the bladder. No focal bladder abnormality. Stomach/Bowel: Two T-tacks are identified at the gastrostomy tube site along the anterior gastric wall, image 39/8. No signs of active intraluminal contrast extravasation into the stomach to indicate active gastric bleeding. Likewise, there is no definite intraluminal contrast extravasation within the large or small bowel loops to indicates active GI bleeding. Moderate retained stool is identified within the colon. Scattered colonic diverticula noted without signs of acute diverticulitis. No pathologic dilatation of the large or small bowel loops to suggest an obstruction. Multiple interloop small bowel fluid collections are identified as noted previously which are concerning for peritoneal disease. Lymphatic: Aortic atherosclerosis. No aneurysm. Upper abdominal vascularity appears patent. No signs of abdominopelvic adenopathy. Reproductive: Uterus is absent. Soft tissue nodule associated with the left side of the vaginal cough is similar to the previous exam measuring 2.4 cm, image 74/8. Previously this measured the same. Other: Peritoneal soft tissue stranding and omental stranding is again noted concerning for peritoneal disease. This appears similar to the previous study. As mentioned above there are multiple fluid density structures which appear closely associated with the wall of the small bowel loops, similar in  appearance to the previous exam. These are concerning for possible peritoneal deposits. Index fluid density structure within the central mesentery measures 2.1 cm, image 57/8. Formally 2.3 cm. Fluid collection  associated with a left abdominal small bowel loop measures 3.4 x 2.6 cm, image 52/8. Formally this measured 2.6 by 2.2 cm. There is mild fluid identified extending along the lefta pericolic gutter into the left retroperitoneal reflection which is new from previous study, image 62/8. Musculoskeletal: No acute or significant osseous findings. No acute or suspicious osseous findings. IMPRESSION: 1. No signs of active intraluminal contrast extravasation into the stomach to indicate active gastric bleeding. Likewise, there is no definite intraluminal contrast extravasation within the large or small bowel loops to indicates active GI bleeding. 2. Non-vascular. Peritoneal soft tissue stranding mild nodularity is again noted concerning for peritoneal disease. This appears similar to the previous exam. There are multiple fluid density structures which appear closely associated with the wall of the small bowel loops, similar in appearance to the previous exam. These are concerning for mucinous peritoneal deposits. 3. There is mild fluid identified extending along the left pericolic gutter into the left retroperitoneal reflection which is new from previous study. 4. Soft tissue nodule associated with the left side of the vaginal cough is similar to the previous exam. 5. Trace left pleural effusion. 6.  Aortic Atherosclerosis (ICD10-I70.0). Electronically Signed   By: Signa Kell M.D.   On: 08/09/2023 13:46   DG Chest Port 1 View  Result Date: 08/09/2023 CLINICAL DATA:  Acute respiratory failure. EXAM: PORTABLE CHEST 1 VIEW COMPARISON:  07/30/2023 FINDINGS: Exam detail is diminished secondary to rotational artifact. There is an endotracheal to in place. The tip of the ET tube is at the level of the carina and is directed towards the right mainstem bronchus. Consider withdrawing by approximately 2.5 cm. Right chest wall port a catheter tip is in the projection of the right atrium. Unchanged. Stable cardiomediastinal  contours. Lung volumes are low with asymmetric elevation of the right hemidiaphragm. Pulmonary vascular congestion noted. IMPRESSION: 1. Endotracheal tube tip is at the level of the carina and is directed towards the right mainstem bronchus. Consider withdrawing by approximately 2.5 cm. 2. Low lung volumes and pulmonary vascular congestion. These results will be called to the ordering clinician or representative by the Radiologist Assistant, and communication documented in the PACS or [employer]. Electronically Signed   By: Signa Kell M.D.   On: 08/09/2023 12:22   IR GASTROSTOMY TUBE MOD SED  Result Date: 07/31/2023 INDICATION: 72 year old female with history of progressive gynecologic malignancy and bowel obstruction requiring percutaneous gastrostomy access for venting purposes. EXAM: PERC PLACEMENT GASTROSTOMY MEDICATIONS: Ancef 2 gm IV; Antibiotics were administered within 1 hour of the procedure. ANESTHESIA/SEDATION: Versed 2 mg IV; Fentanyl 100 mcg IV Moderate Sedation Time:  12 The patient was continuously monitored during the procedure by the interventional radiology nurse under my direct supervision. CONTRAST:  20mL OMNIPAQUE IOHEXOL 300 MG/ML SOLN - administered into the gastric lumen. FLUOROSCOPY TIME:  Ten mGy COMPLICATIONS: None immediate. PROCEDURE: Informed written consent was obtained from the patient after a thorough discussion of the procedural risks, benefits and alternatives. All questions were addressed. Maximal Sterile barrier Technique was utilized including caps, mask, sterile gowns, sterile gloves, sterile drape, hand hygiene and skin antiseptic. A timeout was performed prior to the initiation of the procedure. The patient was placed on the procedure table in the supine position. Pre-procedure abdominal film confirmed visualization of the transverse colon. The  patient was prepped and draped in usual sterile fashion. The stomach was insufflated with air via the indwelling  nasogastric tube. Under fluoroscopy, a puncture site was selected and local analgesia achieved with 1% lidocaine infiltrated subcutaneously. Under fluoroscopic guidance, a gastropexy needle was passed into the stomach and the T-bar suture was released. Entry into the stomach was confirmed with fluoroscopy, aspiration of air, and injection of contrast material. This was repeated with an additional gastropexy suture (for a total of 2 fasteners). At the center of these gastropexy sutures, a dermatotomy was performed. An 18 gauge needle was passed into the stomach at the site of this dermatotomy, and position within the gastric lumen again confirmed under fluoroscopy using aspiration of air and contrast injection. An Amplatz guidewire was passed through this needle and intraluminal placement within the stomach was confirmed by fluoroscopy. The needle was removed. Over the guidewire, the percutaneous tract was dilated using a 10 mm non-compliant balloon. The balloon was deflated, then pushed into the gastric lumen followed in concert by the 20 Fr gastrostomy tube. The retention balloon of the percutaneous gastrostomy tube was inflated with 10 mL of sterile water. The tube was withdrawn until the retention balloon was at the edge of the gastric lumen. The external bumper was brought to the abdominal wall. Contrast was injected through the gastrostomy tube, confirming intraluminal positioning. The patient tolerated the procedure well without any immediate post-procedural complications. IMPRESSION: Technically successful placement of 20 Fr gastrostomy tube. PLAN: Plan for routine 6 month gastrostomy tube exchange. Marliss Coots, MD [facility] Electronically Signed   By: Marliss Coots M.D.   On: 07/31/2023 11:26   DG Chest 2 View  Result Date: 07/30/2023 CLINICAL DATA:  Hospital-acquired pneumonia. EXAM: CHEST - 2 VIEW COMPARISON:  Chest/abdominal radiographs  06/29/2023 FINDINGS: A right jugular Port-A-Cath remains in place with tip near the superior cavoatrial junction. An enteric tube courses into the abdomen with tip not imaged and with side port near the GE junction. The cardiomediastinal silhouette is unchanged with normal heart size. Aortic atherosclerosis is noted. The lungs are hypoinflated. No confluent airspace opacity, edema, pleural effusion, or pneumothorax is identified. No acute osseous abnormality is seen. IMPRESSION: No active cardiopulmonary disease. Electronically Signed   By: Sebastian Ache M.D.   On: 07/30/2023 13:44   DG Abd 1 View  Result Date: 07/30/2023 CLINICAL DATA:  Nasogastric tube placement EXAM: ABDOMEN - 1 VIEW COMPARISON:  07/27/2023 FINDINGS: Enteric tube with tip at the stomach which appears decompressed. No gas dilated bowel is seen, there is partial coverage of the right abdomen. Rounded densities over the left flank are within colonic diverticula based on prior CT. IMPRESSION: Enteric tube with tip at the stomach. Electronically Signed   By: Tiburcio Pea M.D.   On: 07/30/2023 04:36   DG Abd 2 Views  Result Date: 07/27/2023 CLINICAL DATA:  Abdominal pain EXAM: ABDOMEN - 2 VIEW COMPARISON:  07/22/2023 FINDINGS: Gastric catheter is again seen in the stomach. No free air is noted. Right chest wall port is seen. Contrast material is noted in multiple colonic diverticula. A few mildly prominent loops of small bowel are noted in the central abdomen. This is somewhat increased when compare with the prior CT of 07/12/2023. Bony abnormality is noted. IMPRESSION: Slight increase in central small bowel dilatation. CT may be helpful for further evaluation. Electronically Signed   By: Alcide Clever M.D.   On: 07/27/2023 20:13   DG Abd 1 View  Result Date: 07/22/2023 CLINICAL DATA:  [id number] Encounter for nasogastric (NG) tube placement [id number]. EXAM: ABDOMEN - 1 VIEW COMPARISON:  Abdominal radiograph 07/19/2023. FINDINGS: Right chest  Port-A-Cath tip projects over the right atrium. Enteric tube tip and side port project over the stomach. Scant retained contrast in visualized portions of the colon. IMPRESSION: Enteric tube tip and side port project over the stomach. Electronically Signed   By: Orvan Falconer M.D.   On: 07/22/2023 14:27   DG Abd 1 View  Result Date: 07/19/2023 CLINICAL DATA:  [id number] SBO (small bowel obstruction) (HCC) [id number] EXAM: ABDOMEN - 1 VIEW COMPARISON:  July 18, 2023 FINDINGS: Enteric contrast has progressed to the rectum. Multiple colonic diverticuli. No dilated loops of bowel are seen. IMPRESSION: Enteric contrast has progressed to the rectum. Electronically Signed   By: Meda Klinefelter M.D.   On: 07/19/2023 10:22   DG UGI W SINGLE CM (SOL OR THIN BA)  Result Date: 07/18/2023 CLINICAL DATA:  Endometrial/uterine cancer. Fold thickening in the stomach on recent endoscopy. Reflux and bloating. Difficulty keeping foods and liquids down. EXAM: UPPER GI SERIES WITH KUB TECHNIQUE: After obtaining a scout radiograph a routine upper GI series was performed using thin barium. Mobility was mildly limited on today's exam, for example the patient was unable to turn prone. FLUOROSCOPY: Radiation Exposure Index (as provided by the fluoroscopic device): 21.7 mGy Kerma COMPARISON:  CT abdomen 07/12/2023 FINDINGS: Initial KUB unremarkable aside from mild levoconvex lumbar scoliosis. The pharyngeal phase of swallowing appears normal. Primary peristaltic waves were intact on 4/4 swallows. Small type 1 hiatal hernia. Occasional gastroesophageal reflux was observed on today's exam. I used mucosal relief and balloon compression of the stomach in order to help mitigate the use of thin barium and single contrast technique as well as the patient is inability to perform prone imaging. These demonstrate a gastric fold thickening as shown on prior CT and prior endoscopy. The duodenum is mildly dilated, especially proximally.  Eventually this proceeded on to the fourth portion of the duodenum as shown on image 1 series 15. However, the contrast column was sluggish to flow through the duodenum, and given the somewhat dilated appearance, the possibility of obstruction distally is not excluded. Consider following the barium column with serial KUB use in assessing for small bowel obstruction in several hours. A 13 mm barium tablet passed briskly into the stomach. Port-A-Cath noted. IMPRESSION: 1. Gastric fold thickening as shown on prior CT and endoscopy. 2. Small type 1 hiatal hernia. 3. Intermittent gastroesophageal reflux. 4. Mildly dilated duodenum, with sluggish flow of contrast through the duodenum. The possibility of a more distal small bowel obstruction is not excluded given this appearance. Consider KUB follow-up in several hours to assess for transit of the barium column through the small bowel in assessing for small bowel obstruction. Electronically Signed   By: Gaylyn Rong M.D.   On: 07/18/2023 12:00   DG Abd Portable 1 View  Result Date: 07/19/23 CLINICAL DATA:  Nasogastric tube placement. EXAM: PORTABLE ABDOMEN - 1 VIEW COMPARISON:  Abdomen and pelvis CT dated 07/12/2023. FINDINGS: Normal bowel-gas pattern. Interval nasogastric tube with its tip and side hole in the proximal to mid stomach. Lower thoracic spine degenerative changes. IMPRESSION: Nasogastric tube tip and side hole in the proximal to mid stomach. Electronically Signed   By: Beckie Salts M.D.   On: 07/19/23 17:09   DG Abdomen Acute W/Chest  Result Date: 07-19-2023 CLINICAL DATA:  Vomiting EXAM: DG ABDOMEN ACUTE WITH 1 VIEW  CHEST COMPARISON:  07/10/2023. FINDINGS: There is no evidence of dilated bowel loops or free intraperitoneal air. No radiopaque calculi or other significant radiographic abnormality is seen. Heart size and mediastinal contours are within normal limits. Both lungs are clear. There are thoracic degenerative changes. Right-sided  Port-A-Cath tip overlies distal SVC. IMPRESSION: Negative abdominal radiographs.  No acute cardiopulmonary disease. Electronically Signed   By: Layla Maw M.D.   On: 07/18/2023 14:40   CT ABDOMEN PELVIS W CONTRAST  Result Date: 07/12/2023 CLINICAL DATA:  Endometrial/uterine cancer.  Nausea and vomiting. * Tracking Code: BO * EXAM: CT ABDOMEN AND PELVIS WITH CONTRAST TECHNIQUE: Multidetector CT imaging of the abdomen and pelvis was performed using the standard protocol following bolus administration of intravenous contrast. RADIATION DOSE REDUCTION: This exam was performed according to the departmental dose-optimization program which includes automated exposure control, adjustment of the mA and/or kV according to patient size and/or use of iterative reconstruction technique. CONTRAST:  OMNIPAQUE IOHEXOL 300 MG/ML  SOLN COMPARISON:  CT abdomen/pelvis from 06/17/2023 FINDINGS: Lower chest: A catheter terminates in the right atrium on the top most image. Mild cardiomegaly. Hepatobiliary: Unremarkable Pancreas: Unremarkable Spleen: Unremarkable Adrenals/Urinary Tract: Unremarkable Stomach/Bowel: Diffuse gastric wall thickening with mild gastric and substantial proximal duodenal distension. After crossing the midline, the duodenum does not appear distended but there is substantial distal duodenal and jejunal wall thickening, worsened from prior exams. Scattered colonic diverticula. Appendix unremarkable. No pneumatosis. No hypoenhancement of bowel wall. There are abnormal contains small fluid collections along the small bowel mesentery, for example along the left abdomen on image 64 series 7 and along the right abdomen on image 55 series 7. In addition there is fluid density in the lesser sac on image 76 series 7. Vascular/Lymphatic: Mild atheromatous vascular disease of the aortoiliac tree. Patent celiac trunk and SMA. Patent superior mesenteric vein. Suspected left iliac adenopathy measuring about 1.3 cm  in diameter on image 47 series 2, stable. This is of similar density to the adjacent vein. Reproductive: Uterus absent. Nodular enhancement along the left vaginal cuff measuring 2.5 by 1.7 cm, previously 2.3 by 1.7 cm by my measurements. Tumor in this location is not excluded. Other: There is some abnormal stranding along the remaining omentum for example on image 73 series 9. Small nodular deposit along the upper omentum on image 31 series 2. Musculoskeletal: Lower thoracic and lumbar spondylosis. Density within the umbilicus on image 102 series 9, potentially a small knuckle of bowel versus tumor. IMPRESSION: 1. Worsening of gastric wall thickening, distal duodenal wall thickening, and jejunal wall thickening. The stomach and proximal duodenum are distended. The appearance favors gastroenteritis. 2. There is also some abnormal stranding along the remaining omentum. There are also abnormal fluid collections along the small bowel mesentery and in the lesser sac. The appearance raises concern for possible residual peritoneal tumor causing fluid deposits. Small nodular deposit along the upper omentum along with nodularity in the umbilicus. 3. Nodular enhancement along the left vaginal cuff measuring 2.5 by 1.7 cm, previously 2.3 by 1.7 cm by my measurements. This likely represents tumor. 4. Suspected left iliac adenopathy measuring about 1.3 cm in diameter, stable. 5. Mild cardiomegaly. 6. Scattered colonic diverticula. 7. Mild atheromatous vascular disease of the aortoiliac tree. 8. Lower thoracic and lumbar spondylosis. Electronically Signed   By: Gaylyn Rong M.D.   On: 07/12/2023 14:56

## 2023-08-11 NOTE — Plan of Care (Signed)
Problem: Education: Goal: Knowledge of General Education information will improve Description: Including pain rating scale, medication(s)/side effects and non-pharmacologic comfort measures Outcome: Progressing   Problem: Coping: Goal: Level of anxiety will decrease Outcome: Progressing   Problem: Pain Managment: Goal: General experience of comfort will improve Outcome: Progressing

## 2023-08-11 NOTE — Progress Notes (Signed)
Palliative Medicine Progress Note   Patient Name: Sherri Gill       Date: 08/11/2023 DOB: July 31, 1951  Age: 72 y.o. MRN#: [id number] Attending Physician: Sherri Glass, MD Primary Care Physician: Sherri Aschoff, MD Admit Date: 07/05/2023   HPI/Patient Profile: 72 y.o. female with past medical history of metastatic uterine cancer with peritoneal carcinomatosis on active chemotherapy/immunotherapy, paroxysmal atrial fibrillation on Xarelto, DM, and HTN who was admitted on 07/21/2023 with intractable nausea and vomiting. She was started on TPN due to refractory ileus. Palliative Medicine was consulted for goals of care.   On 10/11, patient had bleeding from PEG tube insertion site and hematemesis. She underwent urgent EGD and remained intubated after the procedure. She was extubated on 10/13.   Subjective: Chart reviewed including progress notes, labs, and imaging.  Patient was started on Precedex overnight due to anxiety.  Currently on a low dose of 0.2 mcg/kg/hr.  I met at bedside with Sherri Gill, her daughter Sherri Gill, and her friend Sherri Gill. Sherri Gill is calm and able to participate in GOC discussion.   Reviewed that Sherri Gill is admitted with ileus due to tumor burden within her abdomen from stage IV uterine cancer with peritoneal carcinomatosis.  Reviewed that she has required TPN because her gut is not functioning.  Reviewed that she recently had and additional complication of bleeding from the PEG tube insertion site.  I shared my concern that given her frail and weakened state, Sherri Gill has low reserve to recover from additional complications.    Discussed the "what ifs" and encouraged Sherri Gill to consider what medical interventions she would or would not want in the event her  condition were to deteriorate, keeping in mind the concept of quality of life.    Discussed code status. I encouraged consideration of DNR/DNI status and provided education on evidence-based poor outcomes in similar hospitalized patients, as the cause of cardiac arrest would likely be associated with advanced illness rather than a reversible condition. Sherri Gill is clear that she would not want resuscitation efforts in the event of cardiac arrest and is agreeable to DNR.  However she is unsure about DNI and needs time to think about it - she does state she would only consider a time trial of intubation.   Family is very supportive and agrees with Sherri Gill's decisions for DNR. Their  main concern at this time is guardianship of Sherri Gill's son, who lives with her due to his significant mental illness. When Cos Cob ultimately passes away, she wants her daughter to take over care for her son. Family is working to get documents in place asap that will hopefully facilitate daughter obtaining legal guardianship. They are also working on documents pertaining to financial matters.    Objective:  Physical Exam Vitals reviewed.  Constitutional:      General: She is not in acute distress.    Appearance: She is ill-appearing.  HENT:     Head:     Comments: NG tube to suction Pulmonary:     Effort: Pulmonary effort is normal.  Neurological:     Mental Status: She is alert and oriented to person, place, and time.             Palliative Medicine Assessment & Plan   Assessment: Principal Problem:   Refractory ileus due to carcinomatosis Active Problems:   Essential hypertension   Type 2 diabetes mellitus with neurological complications (HCC)   Uterine cancer (HCC)   Hypokalemia   PAF (paroxysmal atrial fibrillation) (HCC) onset post-op Spring 2023   Malnutrition of moderate degree   Thrush   Hypothyroidism   Pancytopenia (HCC)   Obesity (BMI 30-39.9)    Recommendations/Plan: Code status changed  to DNR with full interventions  Family are working to get documents in place to support daughter obtaining guardianship of patient's adult son I will follow-up with patient and family tomorrow  Prognosis:  Unable to determine  Discharge Planning: To Be Determined   Thank you for allowing the Palliative Medicine Team to assist in the care of this patient.   Time: 95 minutes  Detailed review of medical records (labs, imaging, vital signs), medically appropriate exam, discussed with treatment team, counseling and education to patient, family, & staff, documenting clinical information, medication management, coordination of care.  Signed: Merry Proud, NP   Please contact Palliative Medicine Team phone at [phone] for questions and concerns.  For individual providers, please see AMION.

## 2023-08-11 NOTE — Progress Notes (Signed)
PHARMACY - TOTAL PARENTERAL NUTRITION CONSULT NOTE   Indication: Prolonged ileus  Patient Measurements: Height: 5\' 3"  (160 cm) Weight: 79.2 kg (174 lb 9.7 oz) IBW/kg (Calculated) : 52.4 TPN AdjBW (KG): 58.6 Body mass index is 30.93 kg/m.  Assessment:  [name] admitted 9/16 for nausea/vomiting x 2.5 weeks. Patient with h/o recurrent uterine cancer--Bx taken during 9/18 small bowel enteroscopy but no signs of malignant obstruction, negative for malignancy. Oncology recommended resumption of chemotherapy during this admission--carboplatin and paclitaxel completed 9/25.   Patient has severe malnutrition given inability to keep food down with continued N/V during this admission and PTA. Pharmacy consulted for TPN management.  - Carcinomatosis causing delayed gastric motility   Glucose / Insulin: Hx of T2DM -BG goal < 180. Range: 112 - 154 (5 units SSI/24 hrs) Electrolytes: K 3.3 after 5 runs K yesterday, Na 133;  all others WNL Renal: SCr WNL, BUN 30 Hepatic: Alk Phos WNL. ALT WNL.  AST low at 11. - Albumin low at 2.4 - TG 216 (10/14) , Tbili WNL Intake / Output; MIVF:  I/O =2717 ml -UOP: 3175 ml -NGO 1500 ml -no mIVF  GI Imaging: - 9/20 DG UGI: Gastric fold thickening as shown on prior CT and endoscopy. Mildly dilated duodenum, with sluggish flow of contrast through the duodenum--possibility of a more distal SBO is not excluded given this appearance. - 9/29 abd Xray: Slight increase in central small bowel dilatation.   GI Surgeries / Procedures:  - 9/18: small bowel endoscopy: Normal appearing, widely patent esophagus and GEJ. Edematous gastric folds-biopsied. A single duodenal polyp in the post-bulbar duodenum-biopsied - 10/3:  placement of gastrostomy tube today to be used for venting -10/12: PEG tube came out. NG tube placed for stomach decompression, suction   Central access: port TPN start date:  9/26  Nutritional Goals:  Current TPN (1L Clinimix E 8/10) with fat  administration daily provides 80 g protein and 1159 kcal daily   RD Assessment: Estimated Needs Total Energy Estimated Needs: 1650-1850 Total Protein Estimated Needs: 85-100g Total Fluid Estimated Needs: 1.8L/day  Current Nutrition:  - TPN 9/26>> - 10/4: CLD - 10/7: calorie count started, Glucerna 237 mL TID ordered  - 10/8: pt has n/v wit poor oral intake. Per Dr. Maryfrances Bunnell, cont TPN for now - 10/11: TPN continues at half rate, supplements started per RD and on [place] diet - 10/13 TPN, pt is now NPO, awaiting G-tube replacement  Plan:  Due to the Baxter IV fluid disruption, we will be switching all adult compounded parenteral nutrition to premade Clinimix products +/- fat emulsion infusion. Our goal will be to continue providing as close to 100% of our patient's nutritional needs. Due to the volatile availability of different Clinimix concentrations we may not be able to meet 100% of adult patient nutritional goals at this time.   Now: 6 runs K  Today, at 1800:  Clinimix E 8/10 @ 41.6 ml/hr + fat emulsion 20% 250 ml over 12 hours Add MVI to TPN as pt NPO Continue q6h sSSI TPN labs Mon, Thurs. Check additional labs PRN.   Thank you for allowing pharmacy to be a part of this patient's care.  Herby Abraham, Pharm.D Use secure chat for questions 08/11/2023 9:41 AM

## 2023-08-11 NOTE — Progress Notes (Signed)
Chaplain offered prayer with Sherri Gill and her family/friend at bedside. Chaplain provided support and will continue to follow-up.    08/11/23 1600  Spiritual Encounters  Type of Visit Initial  Care provided to: Pt and family;Friend  Reason for visit Routine spiritual support  Interventions  Spiritual Care Interventions Made Prayer;Established relationship of care and support;Compassionate presence;Reflective listening  Intervention Outcomes  Outcomes Awareness of support;Connection to spiritual care

## 2023-08-12 ENCOUNTER — Encounter (HOSPITAL_COMMUNITY): Payer: Self-pay | Admitting: Gastroenterology

## 2023-08-12 DIAGNOSIS — K567 Ileus, unspecified: Secondary | ICD-10-CM | POA: Diagnosis not present

## 2023-08-12 DIAGNOSIS — K311 Adult hypertrophic pyloric stenosis: Secondary | ICD-10-CM | POA: Diagnosis not present

## 2023-08-12 DIAGNOSIS — C55 Malignant neoplasm of uterus, part unspecified: Secondary | ICD-10-CM | POA: Diagnosis not present

## 2023-08-12 DIAGNOSIS — Z515 Encounter for palliative care: Secondary | ICD-10-CM | POA: Diagnosis not present

## 2023-08-12 LAB — TYPE AND SCREEN
ABO/RH(D): O POS
Antibody Screen: NEGATIVE
Unit division: 0
Unit division: 0
Unit division: 0
Unit division: 0

## 2023-08-12 LAB — CBC
HCT: 26.5 % — ABNORMAL LOW (ref 36.0–46.0)
Hemoglobin: 8.5 g/dL — ABNORMAL LOW (ref 12.0–15.0)
MCH: 30.2 pg (ref 26.0–34.0)
MCHC: 32.1 g/dL (ref 30.0–36.0)
MCV: 94.3 fL (ref 80.0–100.0)
Platelets: 220 10*3/uL (ref 150–400)
RBC: 2.81 MIL/uL — ABNORMAL LOW (ref 3.87–5.11)
RDW: 16.1 % — ABNORMAL HIGH (ref 11.5–15.5)
WBC: 13.9 10*3/uL — ABNORMAL HIGH (ref 4.0–10.5)
nRBC: 0 % (ref 0.0–0.2)

## 2023-08-12 LAB — BPAM RBC
Blood Product Expiration Date: 202411122359
Blood Product Expiration Date: 202411122359
Blood Product Expiration Date: 202411122359
Blood Product Expiration Date: 202411122359
ISSUE DATE / TIME: 202410111518
ISSUE DATE / TIME: 202410111530
ISSUE DATE / TIME: 202410111536
Unit Type and Rh: 5100
Unit Type and Rh: 5100
Unit Type and Rh: 5100
Unit Type and Rh: 5100

## 2023-08-12 LAB — COMPREHENSIVE METABOLIC PANEL
ALT: 16 U/L (ref 0–44)
AST: 14 U/L — ABNORMAL LOW (ref 15–41)
Albumin: 2.5 g/dL — ABNORMAL LOW (ref 3.5–5.0)
Alkaline Phosphatase: 155 U/L — ABNORMAL HIGH (ref 38–126)
Anion gap: 5 (ref 5–15)
BUN: 29 mg/dL — ABNORMAL HIGH (ref 8–23)
CO2: 17 mmol/L — ABNORMAL LOW (ref 22–32)
Calcium: 9.6 mg/dL (ref 8.9–10.3)
Chloride: 109 mmol/L (ref 98–111)
Creatinine, Ser: 0.7 mg/dL (ref 0.44–1.00)
GFR, Estimated: >60 mL/min (ref >60)
Glucose, Bld: 172 mg/dL — ABNORMAL HIGH (ref 70–99)
Potassium: 3.8 mmol/L (ref 3.5–5.1)
Sodium: 131 mmol/L — ABNORMAL LOW (ref 135–145)
Total Bilirubin: 0.6 mg/dL (ref 0.3–1.2)
Total Protein: 6.6 g/dL (ref 6.5–8.1)

## 2023-08-12 LAB — GLUCOSE, CAPILLARY
Glucose-Capillary: 176 mg/dL — ABNORMAL HIGH (ref 70–99)
Glucose-Capillary: 137 mg/dL — ABNORMAL HIGH (ref 70–99)
Glucose-Capillary: 123 mg/dL — ABNORMAL HIGH (ref 70–99)

## 2023-08-12 MED ORDER — LORAZEPAM 2 MG/ML IJ SOLN
0.2500 mg | Freq: Four times a day (QID) | INTRAMUSCULAR | Status: DC | PRN
  Administered 2023-08-12 – 2023-08-13 (×2): 0.25 mg via INTRAVENOUS
  Filled 2023-08-12 (×2): qty 1

## 2023-08-12 MED ORDER — ACETAMINOPHEN 10 MG/ML IV SOLN
1000.0000 mg | Freq: Once | INTRAVENOUS | Status: AC
  Administered 2023-08-12: 1000 mg via INTRAVENOUS
  Filled 2023-08-12: qty 100

## 2023-08-12 MED ORDER — HYDROMORPHONE HCL 1 MG/ML IJ SOLN
0.5000 mg | INTRAMUSCULAR | Status: DC | PRN
  Administered 2023-08-13 (×2): 0.5 mg via INTRAVENOUS
  Filled 2023-08-12 (×3): qty 1

## 2023-08-12 MED ORDER — POTASSIUM CHLORIDE 10 MEQ/100ML IV SOLN
10.0000 meq | Freq: Once | INTRAVENOUS | Status: AC
  Administered 2023-08-12: 10 meq via INTRAVENOUS
  Filled 2023-08-12: qty 100

## 2023-08-12 MED ORDER — FAT EMUL FISH OIL/PLANT BASED 20% (SMOFLIPID)IV EMUL
250.0000 mL | INTRAVENOUS | Status: AC
  Administered 2023-08-12: 250 mL via INTRAVENOUS
  Filled 2023-08-12: qty 250

## 2023-08-12 MED ORDER — METOPROLOL TARTRATE 5 MG/5ML IV SOLN
5.0000 mg | Freq: Four times a day (QID) | INTRAVENOUS | Status: DC
  Administered 2023-08-12 – 2023-08-25 (×48): 5 mg via INTRAVENOUS
  Filled 2023-08-12 (×49): qty 5

## 2023-08-12 MED ORDER — TRACE MINERALS CU-MN-SE-ZN 300-55-60-3000 MCG/ML IV SOLN
INTRAVENOUS | Status: AC
  Filled 2023-08-12: qty 1000

## 2023-08-12 MED ORDER — BISACODYL 10 MG RE SUPP
10.0000 mg | Freq: Once | RECTAL | Status: AC
  Administered 2023-08-12: 10 mg via RECTAL
  Filled 2023-08-12: qty 1

## 2023-08-12 NOTE — Progress Notes (Signed)
eLink Physician-Brief Progress Note Patient Name: Sherri Gill DOB: 1951-01-30 MRN: [id number]   Date of Service  08/12/2023  HPI/Events of Note  Patient having 5/10 pain and does not want dilaudid. Is there anything else we can give? It will need to be IV  Patient states she has a headache pain on temples  eICU Interventions  Ordered a one time dose of acetaminophen IV     Intervention Category Intermediate Interventions: Pain - evaluation and management  Darl Pikes 08/12/2023, 8:40 PM

## 2023-08-12 NOTE — TOC Progression Note (Signed)
Transition of Care [facility]) - Progression Note    Patient Details  Name: Sherri Gill MRN: [id number] Date of Birth: May 14, 1951  Transition of Care [facility]) CM/SW Contact  Darleene Cleaver, [place] Phone Number: 08/12/2023, 4:01 PM  Clinical Narrative:     Patient was seen by PT, and they are recommending home health PT.  TOC to continue to follow patient's progress throughout discharge planning.  Expected Discharge Plan: Home/Self Care Barriers to Discharge: Continued Medical Work up  Expected Discharge Plan and Services In-house Referral: NA Discharge Planning Services: CM Consult Post Acute Care Choice: NA Living arrangements for the past 2 months: Apartment                 DME Arranged: N/A                     Social Determinants of Health (SDOH) Interventions SDOH Screenings   Food Insecurity: No Food Insecurity (07/14/2023)  Housing: Low Risk  (07/14/2023)  Transportation Needs: No Transportation Needs (07/14/2023)  Utilities: Not At Risk (07/14/2023)  Alcohol Screen: Low Risk  (02/20/2023)  Depression (PHQ2-9): Medium Risk (03/25/2023)  Financial Resource Strain: Medium Risk (02/20/2023)  Physical Activity: Inactive (02/20/2023)  Social Connections: Unknown (02/20/2023)  Stress: Stress Concern Present (02/20/2023)  Tobacco Use: Medium Risk (07/14/2023)    Readmission Risk Interventions    08/08/2023    6:50 PM 07/16/2023   11:28 AM  Readmission Risk Prevention Plan  Transportation Screening Complete Complete  PCP or Specialist Appt within 3-5 Days  Complete  HRI or Home Care Consult  Complete  Social Work Consult for Recovery Care Planning/Counseling  Complete  Palliative Care Screening  Not Applicable  Medication Review [employer]) Complete Referral to Pharmacy  PCP or Specialist appointment within 3-5 days of discharge Complete   HRI or Home Care Consult Complete   SW Recovery Care/Counseling Consult Complete   Skilled Nursing Facility Not Applicable

## 2023-08-12 NOTE — Evaluation (Signed)
Physical Therapy Evaluation Patient Details Name: Sherri Gill MRN: [id number] DOB: [birth year] Today's Date: 08/12/2023  History of Present Illness  72 year old woman with a history of recurrent stage IV uterine cancer with peritoneal carcinomatosis on chemotherapy per Dr. [name], atrial fibrillation on Xarelto, diabetes, hypertension.  She is admitted with refractory ileus due to her intra-abdominal tumor burden.  She has been on TPN, had a PEG placed on 10/3.  On 10/11 she started bleeding for the PEG tube insertion site and had hematemesis.She went urgently for EGD 10/11> oozing gastric ulcer with active bleeding at the site of PEG tube insertion, unable to achieve hemostasis with clips and Hemospray.  Return to the ICU intubated, extubated 10/13.  Clinical Impression  Pt admitted with above diagnosis.  Pt currently with functional limitations due to the deficits listed below (see PT Problem List). Pt will benefit from acute skilled PT to increase their independence and safety with mobility to allow discharge.     The patient is eager to mobilize. Stood at bed edge and side stepped a few steps.  Patient was very mobilize prior to most recent medical ci\crisis and transfer to ICU. Patient's HR up to 140, SPO2 > 905 on 2 L for activity.  Continue PT for mobility as tolerated.       If plan is discharge home, recommend the following: A little help with walking and/or transfers;A little help with bathing/dressing/bathroom;Assistance with cooking/housework;Assist for transportation;Help with stairs or ramp for entrance   Can travel by private vehicle        Equipment Recommendations Rolling walker (2 wheels)  Recommendations for Other Services       Functional Status Assessment Patient has had a recent decline in their functional status and demonstrates the ability to make significant improvements in function in a reasonable and predictable amount of time.     Precautions /  Restrictions Precautions Precautions: Fall Precaution Comments: NG suction      Mobility  Bed Mobility Overal bed mobility: Needs Assistance Bed Mobility: Supine to Sit, Sit to Supine     Supine to sit: Mod assist, HOB elevated Sit to supine: Mod assist   General bed mobility comments: assist with trunk to pull to sit, assist legs onto bed    Transfers Overall transfer level: Needs assistance Equipment used: Rolling walker (2 wheels) Transfers: Sit to/from Stand Sit to Stand: Min assist           General transfer comment: stood from bed  x 2, side steps along the bed x 4    Ambulation/Gait                  Stairs            Wheelchair Mobility     Tilt Bed    Modified Rankin (Stroke Patients Only)       Balance Overall balance assessment: Needs assistance Sitting-balance support: Bilateral upper extremity supported, Feet supported Sitting balance-Leahy Scale: Fair     Standing balance support: Bilateral upper extremity supported, During functional activity, Reliant on assistive device for balance Standing balance-Leahy Scale: Fair                               Pertinent Vitals/Pain Pain Assessment Pain Assessment: No/denies pain    Home Living Family/patient expects to be discharged to:: Private residence Living Arrangements: Children Available Help at Discharge: Available 24 hours/day Type of Home: Apartment Home Access:  Stairs to enter Entrance Stairs-Rails: Right Entrance Stairs-Number of Steps: 3   Home Layout: One level Home Equipment: None      Prior Function Prior Level of Function : Independent/Modified Independent                     Extremity/Trunk Assessment   Upper Extremity Assessment Upper Extremity Assessment: Overall WFL for tasks assessed    Lower Extremity Assessment Lower Extremity Assessment: Generalized weakness    Cervical / Trunk Assessment Cervical / Trunk Assessment:  Normal;Other exceptions Cervical / Trunk Exceptions: guarded neck ROM with NG tube  Communication   Communication Communication: No apparent difficulties  Cognition Arousal: Alert Behavior During Therapy: WFL for tasks assessed/performed Overall Cognitive Status: Within Functional Limits for tasks assessed                                          General Comments      Exercises     Assessment/Plan    PT Assessment Patient needs continued PT services  PT Problem List Decreased strength;Decreased activity tolerance;Decreased mobility;Cardiopulmonary status limiting activity;Decreased knowledge of precautions       PT Treatment Interventions DME instruction;Functional mobility training;Gait training;Therapeutic activities;Therapeutic exercise;Patient/family education    PT Goals (Current goals can be found in the Care Plan section)  Acute Rehab PT Goals Patient Stated Goal: to get up, walk again PT Goal Formulation: With patient/family Time For Goal Achievement: 2023-09-24 Potential to Achieve Goals: Good    Frequency Min 1X/week     Co-evaluation               AM-PAC PT "6 Clicks" Mobility  Outcome Measure Help needed turning from your back to your side while in a flat bed without using bedrails?: A Lot Help needed moving from lying on your back to sitting on the side of a flat bed without using bedrails?: A Lot Help needed moving to and from a bed to a chair (including a wheelchair)?: A Lot Help needed standing up from a chair using your arms (e.g., wheelchair or bedside chair)?: A Lot Help needed to walk in hospital room?: Total Help needed climbing 3-5 steps with a railing? : Total 6 Click Score: 10    End of Session Equipment Utilized During Treatment: Oxygen Activity Tolerance: Patient tolerated treatment well Patient left: in bed;with call bell/phone within reach;with bed alarm set;with family/visitor present;with nursing/sitter in  room Nurse Communication: Mobility status PT Visit Diagnosis: Unsteadiness on feet (R26.81);Difficulty in walking, not elsewhere classified (R26.2)    Time: 8119-1478 PT Time Calculation (min) (ACUTE ONLY): 18 min   Charges:   PT Evaluation $PT Eval Low Complexity: 1 Low   PT General Charges $$ ACUTE PT VISIT: 1 Visit         [name] PT Acute Rehabilitation Services Office [phone] Weekend pager-[phone]   [name] 08/12/2023, 3:20 PM

## 2023-08-12 NOTE — Progress Notes (Signed)
Chaplain offered prayer over [name] with her family and friends at bedside. Sherri Gill values continuous Chaplain support and prayers.    08/12/23 1500  Spiritual Encounters  Type of Visit Follow up  Care provided to: Pt and family  Reason for visit Routine spiritual support  Interventions  Spiritual Care Interventions Made Prayer  Intervention Outcomes  Outcomes Awareness of support;Reduced anxiety

## 2023-08-12 NOTE — Progress Notes (Signed)
IVT consult placed for PAC needle& dressing change. Spoke with primary RN & plan will be to change needle at the time TPN is changed out this evening (approximately 1800).

## 2023-08-12 NOTE — Plan of Care (Signed)
  Problem: Education: Goal: Knowledge of General Education information will improve Description: Including pain rating scale, medication(s)/side effects and non-pharmacologic comfort measures Outcome: Progressing   Problem: Health Behavior/Discharge Planning: Goal: Ability to manage health-related needs will improve Outcome: Progressing   Problem: Safety: Goal: Ability to remain free from injury will improve Outcome: Progressing   Problem: Nutrition: Goal: Adequate nutrition will be maintained Outcome: Not Progressing   Problem: Coping: Goal: Level of anxiety will decrease Outcome: Not Progressing   Problem: Pain Managment: Goal: General experience of comfort will improve Outcome: Adequate for Discharge

## 2023-08-12 NOTE — Plan of Care (Signed)

## 2023-08-12 NOTE — Progress Notes (Signed)
eLink Physician-Brief Progress Note Patient Name: SULTANA TIERNEY DOB: 1951/07/13 MRN: [id number]   Date of Service  08/12/2023  HPI/Events of Note  Bedside asking can patient have ice chips. She has been NPO. On TPN  On chart review patient with refractory ileus and gastric ulcers that was not controlled on EGD  eICU Interventions  Will hold off on any oral intake for now until cleared by GI     Intervention Category Minor Interventions: Other:  Darl Pikes 08/12/2023, 8:30 PM

## 2023-08-12 NOTE — Progress Notes (Signed)
PHARMACY - TOTAL PARENTERAL NUTRITION CONSULT NOTE   Indication: Prolonged ileus  Patient Measurements: Height: 5\' 3"  (160 cm) Weight: 79.2 kg (174 lb 9.7 oz) IBW/kg (Calculated) : 52.4 TPN AdjBW (KG): 58.6 Body mass index is 30.93 kg/m.  Assessment:  [name] admitted 9/16 for nausea/vomiting x 2.5 weeks. Patient with h/o recurrent uterine cancer--Bx taken during 9/18 small bowel enteroscopy but no signs of malignant obstruction, negative for malignancy. Oncology recommended resumption of chemotherapy during this admission--carboplatin and paclitaxel completed 9/25.   Patient has severe malnutrition given inability to keep food down with continued N/V during this admission and PTA. Pharmacy consulted for TPN management.  - Carcinomatosis causing delayed gastric motility   Glucose / Insulin: Hx of T2DM -BG goal < 180. Range: 127 - 1768(5 units SSI/24 hrs) Electrolytes: K 3.3 after 6 runs K yesterday, Na 131;  all others WNL Renal: SCr WNL, BUN 30 Hepatic: Alk Phos WNL. ALT WNL.  AST low . - Albumin low  - TG 216 (10/14) , Tbili WNL Intake / Output; MIVF:  I/O + 159.5  ml -UOP: 1775 ml -NGO  950 mls so far today; none recorded for 10/14, 1500 ml for 10/13 -no mIVF  GI Imaging: - 9/20 DG UGI: Gastric fold thickening as shown on prior CT and endoscopy. Mildly dilated duodenum, with sluggish flow of contrast through the duodenum--possibility of a more distal SBO is not excluded given this appearance. - 9/29 abd Xray: Slight increase in central small bowel dilatation.   GI Surgeries / Procedures:  - 9/18: small bowel endoscopy: Normal appearing, widely patent esophagus and GEJ. Edematous gastric folds-biopsied. A single duodenal polyp in the post-bulbar duodenum-biopsied - 10/3:  placement of gastrostomy tube today to be used for venting -10/12: PEG tube came out. NG tube placed for stomach decompression, suction   Central access: port TPN start date:  9/26  Nutritional  Goals:  Current TPN (1L Clinimix E 8/10) with fat administration daily provides 80 g protein and 1159 kcal daily   RD Assessment: Estimated Needs Total Energy Estimated Needs: 1650-1850 Total Protein Estimated Needs: 85-100g Total Fluid Estimated Needs: 1.8L/day  Current Nutrition:  - TPN 9/26>> - 10/4: CLD - 10/7: calorie count started, Glucerna 237 mL TID ordered  - 10/8: pt has n/v wit poor oral intake. Per Dr. Maryfrances Bunnell, cont TPN for now - 10/11: TPN continues at half rate, supplements started per RD and on [place] diet - 10/13 TPN, pt is now NPO, awaiting G-tube replacement  Plan:  Due to the Baxter IV fluid disruption, we will be switching all adult compounded parenteral nutrition to premade Clinimix products +/- fat emulsion infusion. Our goal will be to continue providing as close to 100% of our patient's nutritional needs. Due to the volatile availability of different Clinimix concentrations we may not be able to meet 100% of adult patient nutritional goals at this time.   Today, at 1800:  Clinimix E 8/10 @ 41.6 ml/hr + fat emulsion 20% 250 ml over 12 hours Add MVI to TPN Continue q6h sSSI TPN labs Mon, Thurs. Check additional labs PRN.   Thank you for allowing pharmacy to be a part of this patient's care.  Herby Abraham, Pharm.D Use secure chat for questions 08/12/2023 10:12 AM

## 2023-08-12 NOTE — Progress Notes (Signed)
Palliative Medicine Progress Note   Patient Name: Sherri Gill       Date: 08/12/2023 DOB: [birth year]  Age: 72 y.o. MRN#: [id number] Attending Physician: Lorin Glass, MD Primary Care Physician: [name], MD Admit Date: 07/18/2023  Reason for Consultation/Follow-up: {Reason for Consult:23484}  HPI/Patient Profile: 72 y.o. female with past medical history of metastatic uterine cancer with peritoneal carcinomatosis on active chemotherapy/immunotherapy, paroxysmal atrial fibrillation on Xarelto, DM, and HTN who was admitted on 07/18/23 with intractable nausea and vomiting. She was started on TPN due to refractory ileus. Palliative Medicine was consulted for goals of care.    On 10/11, patient had bleeding from PEG tube insertion site and hematemesis. She underwent urgent EGD and remained intubated after the procedure. She was extubated on 10/13.   Subjective: Chart reviewed including progress notes, labs, and imaging. Patient remains in ICU. She is off precedex.   Bedside visit. She seems to be in good spirits and is currently visiting with several family members. Sherri Gill reports some shortness of breath earlier today. I provided education that pain medication (opioids) can help relieve shortness of breath. I encouraged her to ask for pain medication if needed for either pain or shortness of breath.     Objective:  Physical Exam          Vital Signs: BP (!) 137/51   Pulse (!) 113   Temp 98.2 F (36.8 C) (Oral)   Resp (!) 32   Ht 5\' 3"  (1.6 m)   Wt 79.2 kg   SpO2 100%   BMI 30.93 kg/m  SpO2: SpO2: 100 % O2 Device: O2 Device: Nasal Cannula O2 Flow Rate: O2 Flow Rate (L/min): 4 L/min  Intake/output summary:  Intake/Output Summary (Last 24 hours) at 08/12/2023  2158 Last data filed at 08/12/2023 2150 Gross per 24 hour  Intake 1253.54 ml  Output 4175 ml  Net -2921.46 ml    LBM: Last BM Date : 08/06/23     Palliative Assessment/Data: ***     Palliative Medicine Assessment & Plan   Assessment: Principal Problem:   Refractory ileus due to carcinomatosis Active Problems:   Essential hypertension   Type 2 diabetes mellitus with neurological complications (HCC)   Uterine cancer (HCC)   Hypokalemia   PAF (paroxysmal atrial fibrillation) (HCC) onset post-op Spring 2023  Malnutrition of moderate degree   Thrush   Hypothyroidism   Pancytopenia (HCC)   Obesity (BMI 30-39.9)    Recommendations/Plan: ***  Goals of Care and Additional Recommendations: Limitations on Scope of Treatment: {Recommended Scope and Preferences:21019}  Code Status:   Prognosis:  {Palliative Care Prognosis:23504}  Discharge Planning: {Palliative dispostion:23505}  Care plan was discussed with ***  Thank you for allowing the Palliative Medicine Team to assist in the care of this patient.   ***   Merry Proud, NP   Please contact Palliative Medicine Team phone at [phone] for questions and concerns.  For individual providers, please see AMION.

## 2023-08-12 NOTE — Progress Notes (Signed)
Nutrition Follow-up  DOCUMENTATION CODES:   Non-severe (moderate) malnutrition in context of chronic illness  INTERVENTION:   -TPN management per Pharmacy -Daily weights while on TPN  NUTRITION DIAGNOSIS:   Moderate Malnutrition related to chronic illness, cancer and cancer related treatments as evidenced by mild muscle depletion, energy intake < or equal to 75% for > or equal to 1 month.  Ongoing.  GOAL:   Patient will meet greater than or equal to 90% of their needs  Meeting 70% of kcal and 94% of protein needs with TPN.  MONITOR:   PO intake, Supplement acceptance, Labs, Weight trends, I & O's (TPN)  ASSESSMENT:   72 y.o. 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  9/16: admitted, NPO, NGT placed 9/18: NGT removed, CLD ->Regular diet ->CLD 9/19: Soft diet ->FLD 9/23: Soft diet 9/24: NPO, NGT replaced 9/26: clamping trials 9/29: clamping trails -> failed, NGT back to suction 9/30: clamping trails resumed 10/2: vomited; NGT back to suction 10/3: venting G-tube placed 10/4: NGT removed 10/11: EGD, remains on vent 10/12: PEG removed, NGT placed 10/13: extubated  Patient to remain NPO. Palliative care having ongoing GOC meetings. TPN to continue: Clinimix E 8/10 @ 41.6 ml/hr + lipid emulson, providing 1159 kcals (~70% of kcal needs and 80g protein (~94% of protein needs).  Admission weight: 178 lbs Current weight: 174 lbs  Medications: Dulcolax, KCl, Compazine  Labs reviewed: CBGs: 127-176 Low Na TG 216   Diet Order:   Diet Order             Diet NPO time specified  Diet effective now                   EDUCATION NEEDS:   No education needs have been  identified at this time  Skin:  Skin Assessment: Reviewed RN Assessment  Last BM:  10/9  Height:   Ht Readings from Last 1 Encounters:  08/09/23 5\' 3"  (1.6 m)    Weight:   Wt Readings from Last 1 Encounters:  08/11/23 79.2 kg    BMI:  Body mass index is 30.93 kg/m.  Estimated Nutritional Needs:   Kcal:  1650-1850  Protein:  85-100g  Fluid:  1.8L/day   Tilda Franco, MS, RD, LDN Inpatient Clinical Dietitian Contact information available via Amion

## 2023-08-12 NOTE — Progress Notes (Signed)
NAME:  Sherri Gill, MRN:  [id number], DOB:  11/12/1950, LOS: 29 ADMISSION DATE:  07/27/2023, CONSULTATION DATE: 08/16/2023 REFERRING MD: Dr. Elnoria Howard, CHIEF COMPLAINT: Acute respiratory failure, shock  History of Present Illness:  72 year old woman with a history of recurrent stage IV uterine cancer with peritoneal carcinomatosis on chemotherapy per Dr. [name], atrial fibrillation on Xarelto, diabetes, hypertension.  She is admitted with refractory ileus due to her intra-abdominal tumor burden.  She has been on TPN, had a PEG placed on 10/3.  On 10/11 she started bleeding for the PEG tube insertion site and had hematemesis.  Xarelto held, last dose was evening 10/10.  She went urgently for EGD 10/11> oozing gastric ulcer with active bleeding at the site of PEG tube insertion, unable to achieve hemostasis with clips and Hemospray.  Return to the ICU intubated, sedated and on low-dose phenylephrine.  Pertinent  Medical History   Past Medical History:  Diagnosis Date   AKI (acute kidney injury) (HCC)    History of   Ascites    09/2021 paracentesis with almost 3 L of fluid 12/2021 Paracentesis performed with 2.8 L   Carpal tunnel syndrome 02/11/2014   Chronic anemia 08/29/2012   Need colonoscopy or report. Need iron panel.    Dyslipidemia 06/30/2007   Dyspnea    Essential hypertension, benign 06/30/2007   GERD 06/30/2007   History of blood transfusion    History of fatty infiltration of liver    History of migraine    Hx of Herpes simplex meningitis 2015   Also noted to have primary empty sella on imaging at this admission   Insomnia 06/11/2012   Major depressive disorder, recurrent episode, moderate with anxious distress (HCC) 06/30/2007   Nausea and vomiting 01/13/2022   Obesity, BMI 35-40 06/11/2012   Peripheral neuropathy 2/2 T2DM 12/28/2009   Primary empty sella syndrome (HCC) 2015   Noted on imaging during hospitalization for herpes meningitis; no pituitary mass, no hormone w/u at  that time, hormonally asymptomatic   Type 2 diabetes mellitus with neurological complications (HCC) 06/30/2007    Significant Hospital Events: Including procedures, antibiotic start and stop dates in addition to other pertinent events   EGD 10/11 >> actively bleeding gastric ulcer at PEG insertion site, unsuccessful clip and Hemospray.  To the ICU intubated and sedated 10/12 empiric antibiotics for fever 10/12 PEG tube came out.  NG tube placed for stomach decompression, suction 10/13 extubated,  10/14  PMT meeting, DNR  Interim History / Subjective:  Asked to be placed back on precedex overnight to help with anxiety/ sleep, on 0.2.  No other complaints of pain or nausea.  No flatus yet but up to [facility] with urge  Objective   Blood pressure (!) 122/46, pulse 76, temperature 99.5 F (37.5 C), resp. rate (!) 28, height 5\' 3"  (1.6 m), weight 79.2 kg, SpO2 100%.        Intake/Output Summary (Last 24 hours) at 08/12/2023 0834 Last data filed at 08/12/2023 0701 Gross per 24 hour  Intake 1780.06 ml  Output 2725 ml  Net -944.94 ml   Filed Weights   08/09/23 0026 08/10/23 0432 08/11/23 0500  Weight: 77 kg 79.9 kg 79.2 kg    Examination: General:  chronically ill and frail appearing elderly female lying in bed in NAD HEENT: MM pink/moist, pupils 2/sluggish, anicteric, L NGT- bilious Neuro: Awake, oriented x 3, MAE, generalized weakness CV: rr, NSR, port R chest PULM:  no labored but tachypneic 20's, clear, 4L at 100% >  2L GI: soft, no BS appreciated on R, scant on left, PEG venting with minimal brown fluid, foley Extremities: warm/dry, no LE edema  Skin: no rashes   No BM since 10/10, denies flatus as of 10/15 Afebrile Labs reviewed Na 131, K 3.8, bicarb 17, sCr 0.7, alk phos up WBC down 13.9, H/H stable UOP 1.7L/ 24hrs OGT output 950/ 12hrs Net +13.5L  Resolved Hospital Problem list   Leukopenia Thrombocytopenia  Assessment & Plan:   Acute upper GI bleeding due to gastric  ulcer/erosion at PEG site.  Hemostasis not achieved on EGD 10/11.  Bleeding stabilized with reversal Xarelto.  PEG out on 10/12, s/p NGT P:  - H/H remains stable.  No evidence of bleeding, continue to monitor closely - PPI BID - consider IR embolization vs GOC talks if re-bleeding - [facility] following - cont to hold xarelto - cont PEG venting  - remains NPO, TPN for nutrition  - CCS signed off 10/14  Acute respiratory insufficiency post procedure, resolved - extubated 10/13, resp remains stable  - cont to wean O2 for sat goal > 92% - encourage mobilization w/ PT, IS - minimize sedatives - delirium precautions  - DNR but ok with short term intubation for now pending ongoing GOC  Fever/ SIRS, resolving  - continue to monitor off abx.  WBC trending down, afebrile - remains hemodynamically stable  - follow cultures> ngtd  Refractory ileus  Uterine cancer with peritoneal carcinomatosis - Has poorly tolerated clamping G-tube, has required venting palliative PEG and TPN previously.  Continue higher output from NGT, scant bowel sounds and no BM in 5 days, concerning for poor clinical trajectory, overall very poor prognosis  - TPN per pharmacy - dulcolax today.  Mobilize as able /w PT.  Optimize electrolytes - Dr. [name] following.  Given current clinical status/condition and severe deconditioning, chemotherapy on hold for now - ongoing GOC, appreciate PMT assistance.  DNR as of 10/14, ok with short term intubation.  Pt/ family need to get some paperwork in order and further clinical course to determine ongoing GOC  Hypokalemia - replete prn for goal > 4, Mag > 2 - trend on labs  Hypothyroidism - NPO.  Will start IV   Paroxysmal atrial fibrillation - cont tele monitoring.  Remains in NSR.   SBP better today - metoprolol prn IV if HR> 120 sustained - cont to hold Diltiazem - optimize electrolytes - Xarelto on hold given high risk for rebleeding  T2DM - cont SSI  sensitive  Deconditioning - PT  - nutrition via TPN   Anxiety - d/c precedex.  Trial of low dose ativan prn.  No reports of pain.  Could also consider SL klonopin  Best Practice (right click and "Reselect all SmartList Selections" daily)   Diet/type: NPO; TPN per pharmacy DVT prophylaxis: SCD GI prophylaxis: PPI Lines: N/A- port in right chest Foley:  Yes, and it is still needed> trial d/c  Code Status:  limited Last date of multidisciplinary goals of care discussion - 10/14 per PMT  Pt/ daughter updated 10/15 am.   Will transfer care to [facility] as of 10/16, PCCM available as needed.   Labs   CBC: Recent Labs  Lab 08/16/2023 2327 08/09/23 0653 08/10/23 0927 08/10/23 1817 08/11/23 0304 08/11/23 1310 08/12/23 0747  WBC 8.5  --  20.8* 22.8* 18.7* 16.8* 13.9*  NEUTROABS 7.1  --   --   --   --   --   --   HGB 9.7*   < >  9.0* 9.7* 8.3* 8.3* 8.5*  HCT 30.3*   < > 27.8* 30.4* 26.0* 26.0* 26.5*  MCV 95.0  --  94.2 94.7 94.9 94.5 94.3  PLT 102*  --  144* 166 166 183 220   < > = values in this interval not displayed.    Basic Metabolic Panel: Recent Labs  Lab 08/07/23 0604 08/04/2023 0601 08/09/2023 1512 08/17/2023 2327 08/09/23 0139 08/10/23 0927 08/11/23 0651 08/12/23 0747  NA 133* 130*   < > 135 132* 133* 133* 131*  K 4.0 3.8   < > 4.0 3.2* 3.5 3.3* 3.8  CL 104 99   < > 111 110 107 110 109  CO2 19* 21*   < > 15* 14* 18* 18* 17*  GLUCOSE 138* 137*   < > 155* 199* 142* 169* 172*  BUN 27* 31*   < > 38* 43* 40* 30* 29*  CREATININE 0.72 0.75   < > 0.78 0.82 0.81 0.76 0.70  CALCIUM 9.3 9.4   < > 7.7* 7.7* 9.1 9.1 9.6  MG 1.6* 1.5*  --   --  1.5* 2.2 2.0  --   PHOS 3.5 3.8  --   --   --   --  3.0  --    < > = values in this interval not displayed.   GFR: Estimated Creatinine Clearance: 65.2 mL/min (by C-G formula based on SCr of 0.7 mg/dL). Recent Labs  Lab 08/15/2023 2327 08/09/23 0139 08/10/23 0927 08/10/23 1817 08/11/23 0304 08/11/23 1310 08/12/23 0747  PROCALCITON  1.85  --   --   --   --   --   --   WBC 8.5  --    < > 22.8* 18.7* 16.8* 13.9*  LATICACIDVEN 1.8 1.1  --   --   --   --   --    < > = values in this interval not displayed.    Liver Function Tests: Recent Labs  Lab 08/21/2023 2215 08/19/2023 2327 08/09/23 0139 08/11/23 0651 08/12/23 0747  AST 20 14* 13* 11* 14*  ALT 25 20 18 14 16   ALKPHOS 149* 112 104 119 155*  BILITOT 1.8* 1.2 1.4* 0.5 0.6  PROT 6.9 5.0* 5.0* 5.8* 6.6  ALBUMIN 3.7 2.7* 2.6* 2.4* 2.5*   No results for input(s): "LIPASE", "AMYLASE" in the last 168 hours. No results for input(s): "AMMONIA" in the last 168 hours.  ABG    Component Value Date/Time   PHART 7.2 (L) 08/24/2023 1718   PCO2ART 46 08/25/2023 1718   PO2ART 138 (H) 08/17/2023 1718   HCO3 18.0 (L) 07/29/2023 1718   TCO2 18 (L) 08/10/2023 1512   ACIDBASEDEF 9.9 (H) 08/23/2023 1718   O2SAT 99.9 08/01/2023 1718     Coagulation Profile: Recent Labs  Lab 08/13/2023 2215  INR 1.0    Cardiac Enzymes: No results for input(s): "CKTOTAL", "CKMB", "CKMBINDEX", "TROPONINI" in the last 168 hours.  HbA1C: Hemoglobin A1C  Date/Time Value Ref Range Status  02/20/2023 10:32 AM 6.4 (A) 4.0 - 5.6 % Final  08/22/2022 09:21 AM 6.0 (A) 4.0 - 5.6 % Final   Hgb A1c MFr Bld  Date/Time Value Ref Range Status  04/05/2022 01:30 PM 6.5 (H) 4.8 - 5.6 % Final    Comment:    (NOTE) Pre diabetes:          5.7%-6.4%  Diabetes:              >6.4%  Glycemic control for   <7.0% adults  with diabetes   12/29/2021 05:40 AM 6.7 (H) 4.8 - 5.6 % Final    Comment:    (NOTE) Pre diabetes:          5.7%-6.4%  Diabetes:              >6.4%  Glycemic control for   <7.0% adults with diabetes     CBG: Recent Labs  Lab 08/10/23 2344 08/11/23 0553 08/11/23 1153 08/11/23 1837 08/11/23 2334  GLUCAP 143* 154* 130* 127* 146*     Critical care time: n/a       Posey Boyer, MSN, AG-ACNP-BC [facility] 08/12/2023, 8:34 AM  See Amion for  pager If no response to pager , please call [phone] until 7pm After 7:00 pm call Elink  [phone]

## 2023-08-13 DIAGNOSIS — K567 Ileus, unspecified: Secondary | ICD-10-CM | POA: Diagnosis not present

## 2023-08-13 DIAGNOSIS — C55 Malignant neoplasm of uterus, part unspecified: Secondary | ICD-10-CM | POA: Diagnosis not present

## 2023-08-13 DIAGNOSIS — R112 Nausea with vomiting, unspecified: Secondary | ICD-10-CM | POA: Diagnosis not present

## 2023-08-13 DIAGNOSIS — K311 Adult hypertrophic pyloric stenosis: Secondary | ICD-10-CM | POA: Diagnosis not present

## 2023-08-13 LAB — CBC
HCT: 31.8 % — ABNORMAL LOW (ref 36.0–46.0)
Hemoglobin: 10 g/dL — ABNORMAL LOW (ref 12.0–15.0)
MCH: 29.9 pg (ref 26.0–34.0)
MCHC: 31.4 g/dL (ref 30.0–36.0)
MCV: 95.2 fL (ref 80.0–100.0)
Platelets: 293 10*3/uL (ref 150–400)
RBC: 3.34 MIL/uL — ABNORMAL LOW (ref 3.87–5.11)
RDW: 15.7 % — ABNORMAL HIGH (ref 11.5–15.5)
WBC: 11.7 10*3/uL — ABNORMAL HIGH (ref 4.0–10.5)
nRBC: 0 % (ref 0.0–0.2)

## 2023-08-13 LAB — COMPREHENSIVE METABOLIC PANEL
ALT: 44 U/L (ref 0–44)
AST: 34 U/L (ref 15–41)
Albumin: 2.9 g/dL — ABNORMAL LOW (ref 3.5–5.0)
Alkaline Phosphatase: 249 U/L — ABNORMAL HIGH (ref 38–126)
Anion gap: 10 (ref 5–15)
BUN: 32 mg/dL — ABNORMAL HIGH (ref 8–23)
CO2: 17 mmol/L — ABNORMAL LOW (ref 22–32)
Calcium: 10.4 mg/dL — ABNORMAL HIGH (ref 8.9–10.3)
Chloride: 105 mmol/L (ref 98–111)
Creatinine, Ser: 0.79 mg/dL (ref 0.44–1.00)
GFR, Estimated: >60 mL/min (ref >60)
Glucose, Bld: 169 mg/dL — ABNORMAL HIGH (ref 70–99)
Potassium: 3.4 mmol/L — ABNORMAL LOW (ref 3.5–5.1)
Sodium: 132 mmol/L — ABNORMAL LOW (ref 135–145)
Total Bilirubin: 0.6 mg/dL (ref 0.3–1.2)
Total Protein: 7.6 g/dL (ref 6.5–8.1)

## 2023-08-13 LAB — GLUCOSE, CAPILLARY
Glucose-Capillary: 119 mg/dL — ABNORMAL HIGH (ref 70–99)
Glucose-Capillary: 141 mg/dL — ABNORMAL HIGH (ref 70–99)
Glucose-Capillary: 144 mg/dL — ABNORMAL HIGH (ref 70–99)
Glucose-Capillary: 158 mg/dL — ABNORMAL HIGH (ref 70–99)

## 2023-08-13 MED ORDER — TRACE MINERALS CU-MN-SE-ZN 300-55-60-3000 MCG/ML IV SOLN
INTRAVENOUS | Status: AC
  Filled 2023-08-13: qty 1000

## 2023-08-13 MED ORDER — FENTANYL CITRATE PF 50 MCG/ML IJ SOSY
12.5000 ug | PREFILLED_SYRINGE | INTRAMUSCULAR | Status: DC | PRN
  Administered 2023-08-14: 25 ug via INTRAVENOUS
  Administered 2023-08-14: 12.5 ug via INTRAVENOUS
  Filled 2023-08-13 (×2): qty 1

## 2023-08-13 MED ORDER — LORAZEPAM 2 MG/ML IJ SOLN
0.2500 mg | Freq: Four times a day (QID) | INTRAMUSCULAR | Status: DC | PRN
  Administered 2023-08-13 – 2023-08-16 (×4): 0.25 mg via INTRAVENOUS
  Filled 2023-08-13 (×5): qty 1

## 2023-08-13 MED ORDER — POTASSIUM CHLORIDE 10 MEQ/100ML IV SOLN
10.0000 meq | INTRAVENOUS | Status: AC
  Administered 2023-08-13 (×3): 10 meq via INTRAVENOUS
  Filled 2023-08-13 (×3): qty 100

## 2023-08-13 MED ORDER — FAT EMUL FISH OIL/PLANT BASED 20% (SMOFLIPID)IV EMUL
250.0000 mL | INTRAVENOUS | Status: AC
  Administered 2023-08-13: 250 mL via INTRAVENOUS
  Filled 2023-08-13: qty 250

## 2023-08-13 NOTE — Evaluation (Signed)
Occupational Therapy Evaluation Patient Details Name: Sherri Gill MRN: [id number] DOB: Feb 12, 1951 Today's Date: 08/13/2023   History of Present Illness 72 year old woman with a history of recurrent stage IV uterine cancer with peritoneal carcinomatosis on chemotherapy per Dr. [name], atrial fibrillation, diabetes, hypertension.  She is admitted with refractory ileus due to her intra-abdominal tumor burden.  She has been on TPN, had a PEG placed on 10/3.  On 10/11 she started bleeding for the PEG tube insertion site and had hematemesis.She went urgently for EGD 10/11> oozing gastric ulcer with active bleeding at the site of PEG tube insertion, unable to achieve hemostasis with clips and Hemospray.  Return to the ICU intubated, extubated 10/13.   Clinical Impression   The pt is currently presenting significantly below her baseline level of functioning for self-care management, given the below listed deficits (see OT problem list). During the session today, she was also noted to be with deconditioning, malaise, compromised activity tolerance, and reports of a headache. She required some reinforcement and encouragement towards therapy. She required increased assist to achieve sitting EOB; once seated EOB, she required min assist for sitting balance. She deferred attempts at out of bed activity. She will benefit from further OT services to facilitate improved ADL performance and to decrease the risk for further weakness and deconditioning. Patient will benefit from continued inpatient follow up therapy, <3 hours/day.        If plan is discharge home, recommend the following: A lot of help with walking and/or transfers;Assistance with cooking/housework;A lot of help with bathing/dressing/bathroom;Help with stairs or ramp for entrance    Functional Status Assessment  Patient has had a recent decline in their functional status and demonstrates the ability to make significant improvements in function  in a reasonable and predictable amount of time.  Equipment Recommendations  Other (comment) (to be determined)    Recommendations for Other Services       Precautions / Restrictions Precautions Precautions: Fall Precaution Comments: NG suction Restrictions Weight Bearing Restrictions: No      Mobility Bed Mobility Overal bed mobility: Needs Assistance Bed Mobility: Supine to Sit, Sit to Supine     Supine to sit: HOB elevated, Max assist Sit to supine: Mod assist        Transfers        General transfer comment: the pt deferred, requesting to return to supine, due to a headache and fatigue      Balance     Sitting balance-Leahy Scale: Poor               ADL either performed or assessed with clinical judgement   ADL Overall ADL's : Needs assistance/impaired Eating/Feeding: NPO   Grooming: Minimal assistance;Bed level Grooming Details (indicate cue type and reason): simulated         Upper Body Dressing : Moderate assistance;Sitting Upper Body Dressing Details (indicate cue type and reason): assist needed to don hospital gown seated EOB Lower Body Dressing: Maximal assistance;Sitting/lateral leans       Toileting- Clothing Manipulation and Hygiene: Maximal assistance;Bed level Toileting - Clothing Manipulation Details (indicate cue type and reason): based on clinical judgement                          Pertinent Vitals/Pain Pain Assessment Pain Location: She reported headache discomfort. Pain Intervention(s): Limited activity within patient's tolerance, Monitored during session        Communication Communication Communication: No apparent difficulties  Cognition Arousal: Alert Behavior During Therapy: WFL for tasks assessed/performed Overall Cognitive Status: Within Functional Limits for tasks assessed                       Home Living Family/patient expects to be discharged to:: Private residence Living Arrangements:  Children Available Help at Discharge: Family Type of Home: Apartment Home Access: Stairs to enter Secretary/administrator of Steps: 3   Home Layout: One level     Bathroom Shower/Tub: Tub/shower unit         Home Equipment: None          Prior Functioning/Environment Prior Level of Function : Independent/Modified Independent               OT Problem List: Decreased strength;Decreased activity tolerance;Impaired balance (sitting and/or standing);Decreased knowledge of use of DME or AE      OT Treatment/Interventions: Self-care/ADL training;Therapeutic exercise;Energy conservation;DME and/or AE instruction;Therapeutic activities;Balance training;Patient/family education    OT Goals(Current goals can be found in the care plan section) Acute Rehab OT Goals OT Goal Formulation: With patient Time For Goal Achievement: 08/27/23 Potential to Achieve Goals:  (guarded at current) ADL Goals Pt Will Perform Grooming: with contact guard assist;standing Pt Will Perform Upper Body Dressing: with set-up;sitting Pt Will Perform Lower Body Dressing: with contact guard assist;sit to/from stand Pt Will Transfer to Toilet: with contact guard assist;ambulating Pt Will Perform Toileting - Clothing Manipulation and hygiene: with contact guard assist;sit to/from stand  OT Frequency: Min 1X/week       AM-PAC OT "6 Clicks" Daily Activity     Outcome Measure Help from another person eating meals?: Total (NPO) Help from another person taking care of personal grooming?: A Little Help from another person toileting, which includes using toliet, bedpan, or urinal?: A Lot Help from another person bathing (including washing, rinsing, drying)?: A Lot Help from another person to put on and taking off regular upper body clothing?: A Lot Help from another person to put on and taking off regular lower body clothing?: A Lot 6 Click Score: 12   End of Session Equipment Utilized During Treatment:  Oxygen Nurse Communication: Mobility status  Activity Tolerance: Patient limited by fatigue Patient left: in bed;with call bell/phone within reach;with bed alarm set  OT Visit Diagnosis: Muscle weakness (generalized) (M62.81)                Time: 7829-5621 OT Time Calculation (min): 17 min Charges:  OT General Charges $OT Visit: 1 Visit OT Evaluation $OT Eval Moderate Complexity: 1 Mod   Zenita Kister L Christee Mervine, OTR/L 08/13/2023, 4:59 PM

## 2023-08-13 NOTE — Progress Notes (Signed)
Chaplain attempted follow up with [facility] today, but she was resting.

## 2023-08-13 NOTE — Progress Notes (Signed)
Sherri Gill   DOB:07-22-1951   UV#:[id number]    ASSESSMENT & PLAN:  Recurrent uterine cancer She presents with partial small bowel obstruction She tolerated chemotherapy with carboplatin and paclitaxel 07/23/2023 Unfortunately, she developed complication with bleeding from the venting gastrostomy tube Currently, she is ill and incredibly debilitated and remained in ICU  Today, the patient is alert with her daughter by the bedside.  I explained to the patient that with her state of significant deconditioning, persistent bowel obstruction, recent bleeding and significant complications from recent chemotherapy, I do not recommend further chemotherapy at this point  With her poor health status, chemotherapy will definitely delay wound healing and potentially increased risk of complication to the point that the patient could potentially succumb to side effects of chemo without the benefits of treatment sooner than her disease process   I strongly recommend the patient to consider transitioning her care to comfort measures when she is ready   Nausea, vomiting and dehydration, with persistent ileus She had venting gastrostomy tube placement on 10/3 NG tube was placed recently after intubation Currently n.p.o. status   Acquired pancytopenia Due to chemotherapy, she had received on October 4 and 10/11 Monitor closely, may need further blood transfusion support   Dehydration, mild hypokalemia and other electrolyte imbalance, severe malnutrition Continue total parenteral nutrition   Goals of care discussion I have a long discussion with both the patient and her daughter I recommend transitioning of care to comfort measures when she is ready   Discharge planning Unknown at this point All questions were answered. The patient knows to call the clinic with any problems, questions or concerns.   The total time spent in the appointment was 55 minutes encounter with patients including review of  chart and various tests results, discussions about plan of care and coordination of care plan  Artis Delay, MD 08/13/2023 8:32 AM  Subjective:  Patient is seen.  She was initially asleep but awake and alert.  I reviewed her current health status so far, complications that have developed recently and discussed future plan of care.  Currently, she is comfortable and denies pain.   Objective:  Vitals:   08/13/23 0500 08/13/23 0800  BP: (!) 154/61 (!) 158/72  Pulse:  (!) 101  Resp: (!) 26 (!) 27  Temp:    SpO2:  100%     Intake/Output Summary (Last 24 hours) at 08/13/2023 0832 Last data filed at 08/13/2023 0800 Gross per 24 hour  Intake 1357.51 ml  Output 1550 ml  Net -192.49 ml    GENERAL:alert, no distress and comfortable ABDOMEN: She has a colostomy bag on the left upper quadrant draining dark liquid NEURO: alert & oriented x 3 with fluent speech   Labs:  Recent Labs    08/09/23 0139 08/10/23 0927 08/11/23 0651 08/12/23 0747  NA 132* 133* 133* 131*  K 3.2* 3.5 3.3* 3.8  CL 110 107 110 109  CO2 14* 18* 18* 17*  GLUCOSE 199* 142* 169* 172*  BUN 43* 40* 30* 29*  CREATININE 0.82 0.81 0.76 0.70  CALCIUM 7.7* 9.1 9.1 9.6  GFRNONAA >60 >60 >60 >60  PROT 5.0*  --  5.8* 6.6  ALBUMIN 2.6*  --  2.4* 2.5*  AST 13*  --  11* 14*  ALT 18  --  14 16  ALKPHOS 104  --  119 155*  BILITOT 1.4*  --  0.5 0.6    Studies:  DG Abd Portable 1V  Result Date: 08/09/2023 CLINICAL  DATA:  Encounter for nasogastric tube placement. EXAM: PORTABLE ABDOMEN - 1 VIEW COMPARISON:  CT angio abdomen 08/09/2023 FINDINGS: Side port of the NG tube is in the stomach. Bowel gas pattern is unremarkable. The lung bases are clear. IMPRESSION: Side port of the NG tube is in the stomach. Electronically Signed   By: Marin Roberts M.D.   On: 08/09/2023 15:30   CT ANGIO ABDOMEN W &/OR WO CONTRAST  Result Date: 08/09/2023 CLINICAL DATA:  History of endometrial/uterine cancer. Head displacement. GI  bleeding. * Tracking Code: BO * EXAM: CT ANGIOGRAPHY ABDOMEN TECHNIQUE: Multidetector CT imaging of the abdomen was performed using the standard protocol during bolus administration of intravenous contrast. Multiplanar reconstructed images and MIPs were obtained and reviewed to evaluate the vascular anatomy. RADIATION DOSE REDUCTION: This exam was performed according to the departmental dose-optimization program which includes automated exposure control, adjustment of the mA and/or kV according to patient size and/or use of iterative reconstruction technique. CONTRAST:  OMNIPAQUE IOHEXOL 350 MG/ML SOLN COMPARISON:  07/12/2023. FINDINGS: VASCULAR Aorta: Normal caliber aorta without aneurysm, dissection, vasculitis or significant stenosis. Mild aortic atherosclerotic calcifications. Celiac: Patent without evidence of aneurysm, dissection, vasculitis or significant stenosis. SMA: Patent without evidence of aneurysm, dissection, vasculitis or significant stenosis. Renals: Both renal arteries are patent without evidence of aneurysm, dissection, vasculitis, fibromuscular dysplasia or significant stenosis. Calcifications are noted at the origin of bilateral renal arteries. IMA: Patent without evidence of aneurysm, dissection, vasculitis or significant stenosis. Inflow: Patent without evidence of aneurysm, dissection, vasculitis or significant stenosis. Veins: No obvious venous abnormality within the limitations of this arterial phase study. Review of the MIP images confirms the above findings. NON-VASCULAR Lower chest: Atelectasis is identified within both lung bases. Trace left pleural effusion. Hepatobiliary: No suspicious liver lesion identified. Recent gastrostomy tube tract is noted within the ventral abdomen through the far lateral aspect of the left hepatic lobe, image 37/8. Here there are 2 adjacent cervical trigger clips. Gallbladder appears within normal limits. No bile duct dilatation. Pancreas:  Unremarkable. No pancreatic ductal dilatation or surrounding inflammatory changes. Spleen: Normal in size without focal abnormality. Adrenals/Urinary Tract: Normal adrenal glands. No nephrolithiasis, hydronephrosis or suspicious mass. Foley catheter identified within the bladder. No focal bladder abnormality. Stomach/Bowel: Two T-tacks are identified at the gastrostomy tube site along the anterior gastric wall, image 39/8. No signs of active intraluminal contrast extravasation into the stomach to indicate active gastric bleeding. Likewise, there is no definite intraluminal contrast extravasation within the large or small bowel loops to indicates active GI bleeding. Moderate retained stool is identified within the colon. Scattered colonic diverticula noted without signs of acute diverticulitis. No pathologic dilatation of the large or small bowel loops to suggest an obstruction. Multiple interloop small bowel fluid collections are identified as noted previously which are concerning for peritoneal disease. Lymphatic: Aortic atherosclerosis. No aneurysm. Upper abdominal vascularity appears patent. No signs of abdominopelvic adenopathy. Reproductive: Uterus is absent. Soft tissue nodule associated with the left side of the vaginal cough is similar to the previous exam measuring 2.4 cm, image 74/8. Previously this measured the same. Other: Peritoneal soft tissue stranding and omental stranding is again noted concerning for peritoneal disease. This appears similar to the previous study. As mentioned above there are multiple fluid density structures which appear closely associated with the wall of the small bowel loops, similar in appearance to the previous exam. These are concerning for possible peritoneal deposits. Index fluid density structure within the central mesentery measures 2.1 cm,  image 57/8. Formally 2.3 cm. Fluid collection associated with a left abdominal small bowel loop measures 3.4 x 2.6 cm, image 52/8.  Formally this measured 2.6 by 2.2 cm. There is mild fluid identified extending along the lefta pericolic gutter into the left retroperitoneal reflection which is new from previous study, image 62/8. Musculoskeletal: No acute or significant osseous findings. No acute or suspicious osseous findings. IMPRESSION: 1. No signs of active intraluminal contrast extravasation into the stomach to indicate active gastric bleeding. Likewise, there is no definite intraluminal contrast extravasation within the large or small bowel loops to indicates active GI bleeding. 2. Non-vascular. Peritoneal soft tissue stranding mild nodularity is again noted concerning for peritoneal disease. This appears similar to the previous exam. There are multiple fluid density structures which appear closely associated with the wall of the small bowel loops, similar in appearance to the previous exam. These are concerning for mucinous peritoneal deposits. 3. There is mild fluid identified extending along the left pericolic gutter into the left retroperitoneal reflection which is new from previous study. 4. Soft tissue nodule associated with the left side of the vaginal cough is similar to the previous exam. 5. Trace left pleural effusion. 6.  Aortic Atherosclerosis (ICD10-I70.0). Electronically Signed   By: Signa Kell M.D.   On: 08/09/2023 13:46   DG Chest Port 1 View  Result Date: 08/09/2023 CLINICAL DATA:  Acute respiratory failure. EXAM: PORTABLE CHEST 1 VIEW COMPARISON:  07/30/2023 FINDINGS: Exam detail is diminished secondary to rotational artifact. There is an endotracheal to in place. The tip of the ET tube is at the level of the carina and is directed towards the right mainstem bronchus. Consider withdrawing by approximately 2.5 cm. Right chest wall port a catheter tip is in the projection of the right atrium. Unchanged. Stable cardiomediastinal contours. Lung volumes are low with asymmetric elevation of the right hemidiaphragm.  Pulmonary vascular congestion noted. IMPRESSION: 1. Endotracheal tube tip is at the level of the carina and is directed towards the right mainstem bronchus. Consider withdrawing by approximately 2.5 cm. 2. Low lung volumes and pulmonary vascular congestion. These results will be called to the ordering clinician or representative by the Radiologist Assistant, and communication documented in the PACS or [employer]. Electronically Signed   By: Signa Kell M.D.   On: 08/09/2023 12:22   IR GASTROSTOMY TUBE MOD SED  Result Date: 07/31/2023 INDICATION: 72 year old female with history of progressive gynecologic malignancy and bowel obstruction requiring percutaneous gastrostomy access for venting purposes. EXAM: PERC PLACEMENT GASTROSTOMY MEDICATIONS: Ancef 2 gm IV; Antibiotics were administered within 1 hour of the procedure. ANESTHESIA/SEDATION: Versed 2 mg IV; Fentanyl 100 mcg IV Moderate Sedation Time:  12 The patient was continuously monitored during the procedure by the interventional radiology nurse under my direct supervision. CONTRAST:  20mL OMNIPAQUE IOHEXOL 300 MG/ML SOLN - administered into the gastric lumen. FLUOROSCOPY TIME:  Ten mGy COMPLICATIONS: None immediate. PROCEDURE: Informed written consent was obtained from the patient after a thorough discussion of the procedural risks, benefits and alternatives. All questions were addressed. Maximal Sterile barrier Technique was utilized including caps, mask, sterile gowns, sterile gloves, sterile drape, hand hygiene and skin antiseptic. A timeout was performed prior to the initiation of the procedure. The patient was placed on the procedure table in the supine position. Pre-procedure abdominal film confirmed visualization of the transverse colon. The patient was prepped and draped in usual sterile fashion. The stomach was insufflated with air via the indwelling nasogastric tube. Under fluoroscopy, a  puncture site was selected and local analgesia  achieved with 1% lidocaine infiltrated subcutaneously. Under fluoroscopic guidance, a gastropexy needle was passed into the stomach and the T-bar suture was released. Entry into the stomach was confirmed with fluoroscopy, aspiration of air, and injection of contrast material. This was repeated with an additional gastropexy suture (for a total of 2 fasteners). At the center of these gastropexy sutures, a dermatotomy was performed. An 18 gauge needle was passed into the stomach at the site of this dermatotomy, and position within the gastric lumen again confirmed under fluoroscopy using aspiration of air and contrast injection. An Amplatz guidewire was passed through this needle and intraluminal placement within the stomach was confirmed by fluoroscopy. The needle was removed. Over the guidewire, the percutaneous tract was dilated using a 10 mm non-compliant balloon. The balloon was deflated, then pushed into the gastric lumen followed in concert by the 20 Fr gastrostomy tube. The retention balloon of the percutaneous gastrostomy tube was inflated with 10 mL of sterile water. The tube was withdrawn until the retention balloon was at the edge of the gastric lumen. The external bumper was brought to the abdominal wall. Contrast was injected through the gastrostomy tube, confirming intraluminal positioning. The patient tolerated the procedure well without any immediate post-procedural complications. IMPRESSION: Technically successful placement of 20 Fr gastrostomy tube. PLAN: Plan for routine 6 month gastrostomy tube exchange. Marliss Coots, MD [facility] Electronically Signed   By: Marliss Coots M.D.   On: 07/31/2023 11:26   DG Chest 2 View  Result Date: 07/30/2023 CLINICAL DATA:  Hospital-acquired pneumonia. EXAM: CHEST - 2 VIEW COMPARISON:  Chest/abdominal radiographs 07/23/2023 FINDINGS: A right jugular Port-A-Cath remains in place with tip near the  superior cavoatrial junction. An enteric tube courses into the abdomen with tip not imaged and with side port near the GE junction. The cardiomediastinal silhouette is unchanged with normal heart size. Aortic atherosclerosis is noted. The lungs are hypoinflated. No confluent airspace opacity, edema, pleural effusion, or pneumothorax is identified. No acute osseous abnormality is seen. IMPRESSION: No active cardiopulmonary disease. Electronically Signed   By: Sebastian Ache M.D.   On: 07/30/2023 13:44   DG Abd 1 View  Result Date: 07/30/2023 CLINICAL DATA:  Nasogastric tube placement EXAM: ABDOMEN - 1 VIEW COMPARISON:  07/27/2023 FINDINGS: Enteric tube with tip at the stomach which appears decompressed. No gas dilated bowel is seen, there is partial coverage of the right abdomen. Rounded densities over the left flank are within colonic diverticula based on prior CT. IMPRESSION: Enteric tube with tip at the stomach. Electronically Signed   By: Tiburcio Pea M.D.   On: 07/30/2023 04:36   DG Abd 2 Views  Result Date: 07/27/2023 CLINICAL DATA:  Abdominal pain EXAM: ABDOMEN - 2 VIEW COMPARISON:  07/22/2023 FINDINGS: Gastric catheter is again seen in the stomach. No free air is noted. Right chest wall port is seen. Contrast material is noted in multiple colonic diverticula. A few mildly prominent loops of small bowel are noted in the central abdomen. This is somewhat increased when compare with the prior CT of 07/12/2023. Bony abnormality is noted. IMPRESSION: Slight increase in central small bowel dilatation. CT may be helpful for further evaluation. Electronically Signed   By: Alcide Clever M.D.   On: 07/27/2023 20:13   DG Abd 1 View  Result Date: 07/22/2023 CLINICAL DATA:  [id number] Encounter for nasogastric (NG) tube placement [id number]. EXAM: ABDOMEN - 1 VIEW COMPARISON:  Abdominal  radiograph 07/19/2023. FINDINGS: Right chest Port-A-Cath tip projects over the right atrium. Enteric tube tip and side port project  over the stomach. Scant retained contrast in visualized portions of the colon. IMPRESSION: Enteric tube tip and side port project over the stomach. Electronically Signed   By: Orvan Falconer M.D.   On: 07/22/2023 14:27   DG Abd 1 View  Result Date: 07/19/2023 CLINICAL DATA:  [id number] SBO (small bowel obstruction) (HCC) [id number] EXAM: ABDOMEN - 1 VIEW COMPARISON:  July 18, 2023 FINDINGS: Enteric contrast has progressed to the rectum. Multiple colonic diverticuli. No dilated loops of bowel are seen. IMPRESSION: Enteric contrast has progressed to the rectum. Electronically Signed   By: Meda Klinefelter M.D.   On: 07/19/2023 10:22   DG UGI W SINGLE CM (SOL OR THIN BA)  Result Date: 07/18/2023 CLINICAL DATA:  Endometrial/uterine cancer. Fold thickening in the stomach on recent endoscopy. Reflux and bloating. Difficulty keeping foods and liquids down. EXAM: UPPER GI SERIES WITH KUB TECHNIQUE: After obtaining a scout radiograph a routine upper GI series was performed using thin barium. Mobility was mildly limited on today's exam, for example the patient was unable to turn prone. FLUOROSCOPY: Radiation Exposure Index (as provided by the fluoroscopic device): 21.7 mGy Kerma COMPARISON:  CT abdomen 07/12/2023 FINDINGS: Initial KUB unremarkable aside from mild levoconvex lumbar scoliosis. The pharyngeal phase of swallowing appears normal. Primary peristaltic waves were intact on 4/4 swallows. Small type 1 hiatal hernia. Occasional gastroesophageal reflux was observed on today's exam. I used mucosal relief and balloon compression of the stomach in order to help mitigate the use of thin barium and single contrast technique as well as the patient is inability to perform prone imaging. These demonstrate a gastric fold thickening as shown on prior CT and prior endoscopy. The duodenum is mildly dilated, especially proximally. Eventually this proceeded on to the fourth portion of the duodenum as shown on image 1 series  15. However, the contrast column was sluggish to flow through the duodenum, and given the somewhat dilated appearance, the possibility of obstruction distally is not excluded. Consider following the barium column with serial KUB use in assessing for small bowel obstruction in several hours. A 13 mm barium tablet passed briskly into the stomach. Port-A-Cath noted. IMPRESSION: 1. Gastric fold thickening as shown on prior CT and endoscopy. 2. Small type 1 hiatal hernia. 3. Intermittent gastroesophageal reflux. 4. Mildly dilated duodenum, with sluggish flow of contrast through the duodenum. The possibility of a more distal small bowel obstruction is not excluded given this appearance. Consider KUB follow-up in several hours to assess for transit of the barium column through the small bowel in assessing for small bowel obstruction. Electronically Signed   By: Gaylyn Rong M.D.   On: 07/18/2023 12:00   DG Abd Portable 1 View  Result Date: 07/15/2023 CLINICAL DATA:  Nasogastric tube placement. EXAM: PORTABLE ABDOMEN - 1 VIEW COMPARISON:  Abdomen and pelvis CT dated 07/12/2023. FINDINGS: Normal bowel-gas pattern. Interval nasogastric tube with its tip and side hole in the proximal to mid stomach. Lower thoracic spine degenerative changes. IMPRESSION: Nasogastric tube tip and side hole in the proximal to mid stomach. Electronically Signed   By: Beckie Salts M.D.   On: 07/26/2023 17:09   DG Abdomen Acute W/Chest  Result Date: 07/08/2023 CLINICAL DATA:  Vomiting EXAM: DG ABDOMEN ACUTE WITH 1 VIEW CHEST COMPARISON:  07/10/2023. FINDINGS: There is no evidence of dilated bowel loops or free intraperitoneal air. No radiopaque calculi or other  significant radiographic abnormality is seen. Heart size and mediastinal contours are within normal limits. Both lungs are clear. There are thoracic degenerative changes. Right-sided Port-A-Cath tip overlies distal SVC. IMPRESSION: Negative abdominal radiographs.  No acute  cardiopulmonary disease. Electronically Signed   By: Layla Maw M.D.   On: 07/05/2023 14:40

## 2023-08-13 NOTE — Progress Notes (Signed)
PROGRESS NOTE    Sherri Gill  ZOX:[id number] DOB: 10/02/1951 DOA: 07/21/2023 PCP: [name], MD   Brief Narrative:  72 year old female with a stage IV uterine cancer status post TAH and LSO with peritoneal carcinomatosis currently on active chemotherapy as an outpatient, persistent A-fib on Xarelto, diabetes mellitus type 2, hypertension presented with persistent nausea and vomiting with intolerance to oral intake.  Hospital course complicated with refractory ileus due to carcinomatosis requiring TPN.  She had PEG placed on 07/31/2023.  On 08-23-23, patient started bleeding from PEG tube insertion site along with large amount of bloody vomitus.  She underwent EGD on 23-Aug-2023 which showed oozing gastric ulcer with active bleeding at the site of PEG tube insertion.  She returned to the ICU intubated, sedated.  On 08/09/2023, PEG tube came out; NG tube placed for stomach decompression and suction.  She was extubated on 08/10/2023.  Palliative care and oncology following.  She was made DNR after palliative care meeting on 08/11/2023.  She was transferred back to [facility] service from 08/13/2023 onwards.  Assessment & Plan:   Acute upper GI bleeding due to gastric ulcer/erosion at PEG tube site -Hemostasis not achieved on EGD on 08/23/2023.  Bleeding stabilized with reversal of Xarelto. -Continue Protonix PPI twice daily. -H&H stable.  No evidence of bleeding. -If patient rebleeds: Will need IR embolization if family continues to want aggressive treatment -Xarelto to remain on hold.  Acute respiratory failure postprocedure, resolved -Extubated on 08/10/2023. -On 4 L oxygen via nasal cannula.  Wean off as able.  Refractory ileus Uterine cancer with peritoneal carcinomatosis Goals of care Generalized deconditioning -Palliative care and oncology following.  CODE STATUS has been changed to DNR during this hospitalization by palliative care team. -Not a candidate for further  chemotherapy as per oncology and oncology recommends transitioning to total comfort measures.  Overall prognosis is very poor. -General Surgery has already signed off. -Currently on TPN as per pharmacy.  Continue NPO.  NG tube recently placed when she was intubated -Had venting gastrostomy tube placed on 07/31/2023 by IR.  Leukocytosis -Improving.  Monitor  Fever/SIRS, resolving -Continue to monitor off antibiotics.  WBC is trending down.  Afebrile.  Cultures negative so far  Anemia of chronic disease -From chronic illnesses.  Hemoglobin currently stable.  Monitor intermittent  Thrombocytopenia -Resolved  Hypokalemia -Mild.  Monitor  Hyponatremia -Mild.  Monitor  Hypothyroidism -Oral levothyroxine on hold  Paroxysmal A-fib -Intermittently tachycardic.  Continue IV metoprolol.  Diltiazem on hold.  Xarelto on hold given risk for rebleeding  Anxiety -Off Precedex drip.  Currently on low-dose IV Ativan trial.   DVT prophylaxis: SCDs Code Status: DNR Family Communication: None at bedside Disposition Plan: Status is: Inpatient Remains inpatient appropriate because: Of severity of illness  Consultants: Oncology/palliative care/GI/PCCM/general surgery/IR  Procedures: As above  Antimicrobials:  Anti-infectives (From admission, onward)    Start     Dose/Rate Route Frequency Ordered Stop   08/09/23 1000  vancomycin (VANCOCIN) IVPB 1000 mg/200 mL premix  Status:  Discontinued        1,000 mg 200 mL/hr over 60 Minutes Intravenous Every 12 hours 08-23-2023 2132 08/10/23 0807   08/09/23 0600  ceFEPIme (MAXIPIME) 2 g in sodium chloride 0.9 % 100 mL IVPB  Status:  Discontinued        2 g 200 mL/hr over 30 Minutes Intravenous Every 8 hours 08-23-23 2132 08/11/23 1132   08/23/2023 2230  vancomycin (VANCOREADY) IVPB 1750 mg/350 mL  1,750 mg 175 mL/hr over 120 Minutes Intravenous  Once 08/02/2023 2122 08/09/23 0117   08/06/2023 2215  vancomycin (VANCOCIN) IVPB 1000 mg/200 mL premix   Status:  Discontinued        1,000 mg 200 mL/hr over 60 Minutes Intravenous  Once 08/02/2023 2117 08/17/2023 2122   08/13/2023 2130  ceFEPIme (MAXIPIME) 2 g in sodium chloride 0.9 % 100 mL IVPB        2 g 200 mL/hr over 30 Minutes Intravenous NOW 08/28/2023 2117 07/31/2023 2309   07/31/23 1000  ceFAZolin (ANCEF) IVPB 2g/100 mL premix        2 g 200 mL/hr over 30 Minutes Intravenous On call 07/31/23 0912 07/31/23 1126   07/27/23 1800  micafungin (MYCAMINE) 100 mg in sodium chloride 0.9 % 100 mL IVPB  Status:  Discontinued        100 mg 105 mL/hr over 1 Hours Intravenous Every 24 hours 07/27/23 1718 08/01/23 1404        Subjective: Patient seen and examined at bedside.  Complains of intermittent abdominal pain.  No fever, seizures or vomiting reported.  Objective: Vitals:   08/13/23 0500 08/13/23 0800 08/13/23 0900 08/13/23 1000  BP: (!) 154/61 (!) 158/72 (!) 152/77 123/79  Pulse:  (!) 101    Resp: (!) 26 (!) 27 (!) 24 (!) 25  Temp:  98 F (36.7 C)    TempSrc:  Oral    SpO2:  100%    Weight:      Height:        Intake/Output Summary (Last 24 hours) at 08/13/2023 1128 Last data filed at 08/13/2023 0833 Gross per 24 hour  Intake 1357.51 ml  Output 1700 ml  Net -342.49 ml   Filed Weights   08/09/23 0026 08/10/23 0432 08/11/23 0500  Weight: 77 kg 79.9 kg 79.2 kg    Examination:  General exam: Appears calm and comfortable.  Looks chronically ill and deconditioned.  On 4 L oxygen via nasal cannula ENT: NG tube present Respiratory system: Bilateral decreased breath sounds at bases with scattered crackles and intermittent tachypnea Cardiovascular system: S1 & S2 heard, Rate controlled Gastrointestinal system: Abdomen is distended, soft and tender.  Gastrostomy tube in place.  Bowel sounds sluggish.   Extremities: No cyanosis, clubbing; trace lower extremity edema present  Central nervous system: Awake.  Slow to respond.  No focal neurological deficits. Moving extremities Skin: No  rashes, lesions or ulcers Psychiatry: Flat affect.  Not agitated.    Data Reviewed: I have personally reviewed following labs and imaging studies  CBC: Recent Labs  Lab 08/06/2023 2327 08/09/23 0653 08/10/23 1817 08/11/23 0304 08/11/23 1310 08/12/23 0747 08/13/23 0845  WBC 8.5   < > 22.8* 18.7* 16.8* 13.9* 11.7*  NEUTROABS 7.1  --   --   --   --   --   --   HGB 9.7*   < > 9.7* 8.3* 8.3* 8.5* 10.0*  HCT 30.3*   < > 30.4* 26.0* 26.0* 26.5* 31.8*  MCV 95.0   < > 94.7 94.9 94.5 94.3 95.2  PLT 102*   < > 166 166 183 220 293   < > = values in this interval not displayed.   Basic Metabolic Panel: Recent Labs  Lab 08/07/23 0604 08/13/2023 0601 08/11/2023 1512 08/09/23 0139 08/10/23 0927 08/11/23 0651 08/12/23 0747 08/13/23 0845  NA 133* 130*   < > 132* 133* 133* 131* 132*  K 4.0 3.8   < > 3.2* 3.5 3.3* 3.8  3.4*  CL 104 99   < > 110 107 110 109 105  CO2 19* 21*   < > 14* 18* 18* 17* 17*  GLUCOSE 138* 137*   < > 199* 142* 169* 172* 169*  BUN 27* 31*   < > 43* 40* 30* 29* 32*  CREATININE 0.72 0.75   < > 0.82 0.81 0.76 0.70 0.79  CALCIUM 9.3 9.4   < > 7.7* 9.1 9.1 9.6 10.4*  MG 1.6* 1.5*  --  1.5* 2.2 2.0  --   --   PHOS 3.5 3.8  --   --   --  3.0  --   --    < > = values in this interval not displayed.   GFR: Estimated Creatinine Clearance: 65.2 mL/min (by C-G formula based on SCr of 0.79 mg/dL). Liver Function Tests: Recent Labs  Lab 08/13/2023 2327 08/09/23 0139 08/11/23 0651 08/12/23 0747 08/13/23 0845  AST 14* 13* 11* 14* 34  ALT 20 18 14 16  44  ALKPHOS 112 104 119 155* 249*  BILITOT 1.2 1.4* 0.5 0.6 0.6  PROT 5.0* 5.0* 5.8* 6.6 7.6  ALBUMIN 2.7* 2.6* 2.4* 2.5* 2.9*   No results for input(s): "LIPASE", "AMYLASE" in the last 168 hours. No results for input(s): "AMMONIA" in the last 168 hours. Coagulation Profile: Recent Labs  Lab 08/10/2023 2215  INR 1.0   Cardiac Enzymes: No results for input(s): "CKTOTAL", "CKMB", "CKMBINDEX", "TROPONINI" in the last 168  hours. BNP (last 3 results) No results for input(s): "PROBNP" in the last 8760 hours. HbA1C: No results for input(s): "HGBA1C" in the last 72 hours. CBG: Recent Labs  Lab 08/12/23 0638 08/12/23 1207 08/12/23 1847 08/13/23 0003 08/13/23 0746  GLUCAP 176* 137* 123* 144* 158*   Lipid Profile: Recent Labs    08/11/23 0651  TRIG 216*   Thyroid Function Tests: No results for input(s): "TSH", "T4TOTAL", "FREET4", "T3FREE", "THYROIDAB" in the last 72 hours. Anemia Panel: No results for input(s): "VITAMINB12", "FOLATE", "FERRITIN", "TIBC", "IRON", "RETICCTPCT" in the last 72 hours. Sepsis Labs: Recent Labs  Lab 07/30/2023 2327 08/09/23 0139  PROCALCITON 1.85  --   LATICACIDVEN 1.8 1.1    Recent Results (from the past 240 hour(s))  Culture, blood (x 2)     Status: None (Preliminary result)   Collection Time: 08/22/2023 10:15 PM   Specimen: BLOOD  Result Value Ref Range Status   Specimen Description   Final    BLOOD BLOOD RIGHT HAND Performed at [facility], [address] [place], [place] [address]    Special Requests   Final    BOTTLES DRAWN AEROBIC ONLY Blood Culture results may not be optimal due to an inadequate volume of blood received in culture bottles Performed at [facility], [address], [place], [place] [address]    Culture   Final    NO GROWTH 4 DAYS Performed at [facility], [address] [place], [place] [address]    Report Status PENDING  Incomplete  Culture, blood (x 2)     Status: None (Preliminary result)   Collection Time: 08/23/2023 11:27 PM   Specimen: BLOOD RIGHT HAND  Result Value Ref Range Status   Specimen Description   Final    BLOOD RIGHT HAND BOTTLES DRAWN AEROBIC ONLY Performed at [facility], [address], [place], [place] [address]    Special Requests   Final    Blood Culture adequate volume Performed at [facility], [address],  [place], [place] [address]    Culture   Final    NO GROWTH 4 DAYS Performed at [facility], [address], [place], [place] [address]    Report Status PENDING  Incomplete         Radiology Studies: No results found.      Scheduled Meds:  Chlorhexidine Gluconate Cloth  6 each Topical Daily   dorzolamide-timolol  1 drop Both Eyes BID   insulin aspart  0-9 Units Subcutaneous Q6H   latanoprost  1 drop Both Eyes QHS   metoprolol tartrate  5 mg Intravenous Q6H   pantoprazole (PROTONIX) IV  40 mg Intravenous BID AC   sodium chloride flush  10 mL Intravenous Q12H   sodium chloride flush  10-40 mL Intracatheter Q12H   Continuous Infusions:  TPN (CLINIMIX-E) Adult 41.6 mL/hr at 08/13/23 0740          Glade Lloyd, MD [facility] 08/13/2023, 11:28 AM

## 2023-08-13 NOTE — Progress Notes (Signed)
Palliative Medicine Progress Note   Patient Name: Sherri Gill       Date: 08/13/2023 DOB: 21-Sep-1951  Age: 72 y.o. MRN#: [id number] Attending Physician: Glade Lloyd, MD Primary Care Physician: [name], MD Admit Date: 2023/08/03  Reason for Consultation/Follow-up: {Reason for Consult:23484}  HPI/Patient Profile: 72 y.o. female with past medical history of metastatic uterine cancer with peritoneal carcinomatosis on active chemotherapy/immunotherapy, paroxysmal atrial fibrillation on Xarelto, DM, and HTN who was admitted on 08-03-23 with intractable nausea and vomiting. She was started on TPN due to refractory ileus. Palliative Medicine was consulted for goals of care.    On 10/11, patient had bleeding from PEG tube insertion site and hematemesis. She underwent urgent EGD and remained intubated after the procedure. She was extubated on 10/13.   Subjective: ***  Objective:  Physical Exam          Vital Signs: BP (!) 138/46 (BP Location: Right Arm)   Pulse (!) 101   Temp (!) 97.5 F (36.4 C) (Oral) Comment: thermostat turned to higher temp  Resp (!) 29   Ht 5\' 3"  (1.6 m)   Wt 79.2 kg   SpO2 100%   BMI 30.93 kg/m  SpO2: SpO2: 100 % O2 Device: O2 Device: Nasal Cannula O2 Flow Rate: O2 Flow Rate (L/min): 3 L/min  Intake/output summary:  Intake/Output Summary (Last 24 hours) at 08/13/2023 1836 Last data filed at 08/13/2023 1836 Gross per 24 hour  Intake 1595.59 ml  Output 850 ml  Net 745.59 ml    LBM: Last BM Date : 08/12/23     Palliative Assessment/Data: ***     Palliative Medicine Assessment & Plan   Assessment: Principal Problem:   Refractory ileus due to carcinomatosis Active Problems:   Essential hypertension   Type 2 diabetes mellitus with  neurological complications (HCC)   Uterine cancer (HCC)   Hypokalemia   PAF (paroxysmal atrial fibrillation) (HCC) onset post-op Spring 2023   Malnutrition of moderate degree   Thrush   Hypothyroidism   Pancytopenia (HCC)   Obesity (BMI 30-39.9)    Recommendations/Plan: ***  Goals of Care and Additional Recommendations: Limitations on Scope of Treatment: {Recommended Scope and Preferences:21019}  Code Status:   Prognosis:  {Palliative Care Prognosis:23504}  Discharge Planning: {Palliative dispostion:23505}  Care plan was discussed with ***  Thank you  for allowing the Palliative Medicine Team to assist in the care of this patient.   ***   Merry Proud, NP   Please contact Palliative Medicine Team phone at [phone] for questions and concerns.  For individual providers, please see AMION.

## 2023-08-13 NOTE — TOC Progression Note (Signed)
Transition of Care [facility]) - Progression Note    Patient Details  Name: Sherri Gill MRN: [id number] Date of Birth: [birth year]  Transition of Care [facility]) CM/SW Contact  Darleene Cleaver, [place] Phone Number: 08/13/2023, 5:50 PM  Clinical Narrative:     Palliative following patient, for goals of care.  TOC following to facilitate home hospice or home health, TOC to follow throughout discharge planning.   Expected Discharge Plan: Home/Self Care Barriers to Discharge: Continued Medical Work up  Expected Discharge Plan and Services In-house Referral: NA Discharge Planning Services: CM Consult Post Acute Care Choice: NA Living arrangements for the past 2 months: Apartment                 DME Arranged: N/A                     Social Determinants of Health (SDOH) Interventions SDOH Screenings   Food Insecurity: No Food Insecurity (07/14/2023)  Housing: Low Risk  (07/14/2023)  Transportation Needs: No Transportation Needs (07/14/2023)  Utilities: Not At Risk (07/14/2023)  Alcohol Screen: Low Risk  (02/20/2023)  Depression (PHQ2-9): Medium Risk (03/25/2023)  Financial Resource Strain: Medium Risk (02/20/2023)  Physical Activity: Inactive (02/20/2023)  Social Connections: Unknown (02/20/2023)  Stress: Stress Concern Present (02/20/2023)  Tobacco Use: Medium Risk (07/14/2023)    Readmission Risk Interventions    08/08/2023    6:50 PM 07/16/2023   11:28 AM  Readmission Risk Prevention Plan  Transportation Screening Complete Complete  PCP or Specialist Appt within 3-5 Days  Complete  HRI or Home Care Consult  Complete  Social Work Consult for Recovery Care Planning/Counseling  Complete  Palliative Care Screening  Not Applicable  Medication Review Oceanographer) Complete Referral to Pharmacy  PCP or Specialist appointment within 3-5 days of discharge Complete   HRI or Home Care Consult Complete   SW Recovery Care/Counseling Consult Complete   Skilled Nursing Facility Not  Applicable

## 2023-08-13 NOTE — Progress Notes (Signed)
PHARMACY - TOTAL PARENTERAL NUTRITION CONSULT NOTE   Indication: Prolonged ileus  Patient Measurements: Height: 5\' 3"  (160 cm) Weight: 79.2 kg (174 lb 9.7 oz) IBW/kg (Calculated) : 52.4 TPN AdjBW (KG): 58.6 Body mass index is 30.93 kg/m.  Assessment:  [name] admitted 9/16 for nausea/vomiting x 2.5 weeks. Patient with h/o recurrent uterine cancer--Bx taken during 9/18 small bowel enteroscopy but no signs of malignant obstruction, negative for malignancy. Oncology recommended resumption of chemotherapy during this admission--carboplatin and paclitaxel completed 9/25.   Patient has severe malnutrition given inability to keep food down with continued N/V during this admission and PTA. Pharmacy consulted for TPN management.  - Carcinomatosis causing delayed gastric motility   Glucose / Insulin: Hx of T2DM -BG goal < 180. Range: 123 - 176 (7 units SSI/24 hrs) Electrolytes: K 3.4; Na 132 Renal: SCr WNL, BUN 32 Hepatic: Alk Phos slightly high, AST/ALT WNL - Albumin low  - TG 216 (10/14) , Tbili WNL Intake / Output; MIVF:  -UOP: 2425 ml -NG: 1750 ml -no mIVF  GI Imaging: - 9/20 DG UGI: Gastric fold thickening as shown on prior CT and endoscopy. Mildly dilated duodenum, with sluggish flow of contrast through the duodenum--possibility of a more distal SBO is not excluded given this appearance. - 9/29 abd Xray: Slight increase in central small bowel dilatation.   GI Surgeries / Procedures:  - 9/18: small bowel endoscopy: Normal appearing, widely patent esophagus and GEJ. Edematous gastric folds-biopsied. A single duodenal polyp in the post-bulbar duodenum-biopsied - 10/3:  placement of gastrostomy tube today to be used for venting -10/12: PEG tube came out. NG tube placed for stomach decompression, suction   Central access: port TPN start date:  9/26  Nutritional Goals:  Current TPN (1L Clinimix E 8/10) with fat administration daily provides 80 g protein and 1159 kcal  daily   RD Assessment: Estimated Needs Total Energy Estimated Needs: 1650-1850 Total Protein Estimated Needs: 85-100g Total Fluid Estimated Needs: 1.8L/day  Current Nutrition:  TPN 9/26 >> NPO  Plan:  Due to the Baxter IV fluid disruption, we will be switching all adult compounded parenteral nutrition to premade Clinimix products +/- fat emulsion infusion. Our goal will be to continue providing as close to 100% of our patient's nutritional needs. Due to the volatile availability of different Clinimix concentrations we may not be able to meet 100% of adult patient nutritional goals at this time.     KCl 10 mEq IV x 3 runs Clinimix E 8/10 @ 41.6 ml/hr + fat emulsion 20% 250 ml over 12 hours Add MVI to TPN Continue q6h sSSI TPN labs Mon, Thurs. Check additional labs PRN.   Thank you for allowing pharmacy to be a part of this patient's care.  Pricilla Riffle, PharmD, BCPS Clinical Pharmacist 08/13/2023 2:23 PM

## 2023-08-14 ENCOUNTER — Inpatient Hospital Stay (HOSPITAL_COMMUNITY): Payer: Medicare HMO

## 2023-08-14 DIAGNOSIS — K567 Ileus, unspecified: Secondary | ICD-10-CM | POA: Diagnosis not present

## 2023-08-14 LAB — COMPREHENSIVE METABOLIC PANEL
ALT: 56 U/L — ABNORMAL HIGH (ref 0–44)
AST: 39 U/L (ref 15–41)
Albumin: 2.8 g/dL — ABNORMAL LOW (ref 3.5–5.0)
Alkaline Phosphatase: 328 U/L — ABNORMAL HIGH (ref 38–126)
Anion gap: 9 (ref 5–15)
BUN: 38 mg/dL — ABNORMAL HIGH (ref 8–23)
CO2: 17 mmol/L — ABNORMAL LOW (ref 22–32)
Calcium: 10.2 mg/dL (ref 8.9–10.3)
Chloride: 102 mmol/L (ref 98–111)
Creatinine, Ser: 0.79 mg/dL (ref 0.44–1.00)
GFR, Estimated: >60 mL/min (ref >60)
Glucose, Bld: 165 mg/dL — ABNORMAL HIGH (ref 70–99)
Potassium: 3.6 mmol/L (ref 3.5–5.1)
Sodium: 128 mmol/L — ABNORMAL LOW (ref 135–145)
Total Bilirubin: 0.4 mg/dL (ref 0.3–1.2)
Total Protein: 7.9 g/dL (ref 6.5–8.1)

## 2023-08-14 LAB — CULTURE, BLOOD (ROUTINE X 2)
Culture: NO GROWTH
Culture: NO GROWTH
Special Requests: ADEQUATE

## 2023-08-14 LAB — GLUCOSE, CAPILLARY
Glucose-Capillary: 146 mg/dL — ABNORMAL HIGH (ref 70–99)
Glucose-Capillary: 160 mg/dL — ABNORMAL HIGH (ref 70–99)
Glucose-Capillary: 162 mg/dL — ABNORMAL HIGH (ref 70–99)
Glucose-Capillary: 166 mg/dL — ABNORMAL HIGH (ref 70–99)

## 2023-08-14 LAB — CBC
HCT: 29.4 % — ABNORMAL LOW (ref 36.0–46.0)
Hemoglobin: 9.7 g/dL — ABNORMAL LOW (ref 12.0–15.0)
MCH: 31.1 pg (ref 26.0–34.0)
MCHC: 33 g/dL (ref 30.0–36.0)
MCV: 94.2 fL (ref 80.0–100.0)
Platelets: 335 10*3/uL (ref 150–400)
RBC: 3.12 MIL/uL — ABNORMAL LOW (ref 3.87–5.11)
RDW: 15.6 % — ABNORMAL HIGH (ref 11.5–15.5)
WBC: 11 10*3/uL — ABNORMAL HIGH (ref 4.0–10.5)
nRBC: 0.2 % (ref 0.0–0.2)

## 2023-08-14 LAB — MAGNESIUM: Magnesium: 1.7 mg/dL (ref 1.7–2.4)

## 2023-08-14 LAB — PHOSPHORUS: Phosphorus: 3.1 mg/dL (ref 2.5–4.6)

## 2023-08-14 MED ORDER — TRACE MINERALS CU-MN-SE-ZN 300-55-60-3000 MCG/ML IV SOLN
INTRAVENOUS | Status: AC
  Filled 2023-08-14: qty 1000

## 2023-08-14 MED ORDER — POTASSIUM CHLORIDE 10 MEQ/100ML IV SOLN
10.0000 meq | INTRAVENOUS | Status: AC
  Administered 2023-08-14 (×3): 10 meq via INTRAVENOUS
  Filled 2023-08-14 (×3): qty 100

## 2023-08-14 MED ORDER — LORAZEPAM 2 MG/ML IJ SOLN
2.0000 mg | Freq: Once | INTRAMUSCULAR | Status: AC
  Administered 2023-08-14: 2 mg via INTRAVENOUS

## 2023-08-14 MED ORDER — LORAZEPAM 2 MG/ML IJ SOLN
1.0000 mg | Freq: Once | INTRAMUSCULAR | Status: DC
  Filled 2023-08-14: qty 1

## 2023-08-14 MED ORDER — FENTANYL CITRATE PF 50 MCG/ML IJ SOSY
50.0000 ug | PREFILLED_SYRINGE | INTRAMUSCULAR | Status: DC | PRN
  Administered 2023-08-14 – 2023-08-25 (×47): 50 ug via INTRAVENOUS
  Filled 2023-08-14 (×51): qty 1

## 2023-08-14 MED ORDER — FAT EMUL FISH OIL/PLANT BASED 20% (SMOFLIPID)IV EMUL
250.0000 mL | INTRAVENOUS | Status: AC
  Administered 2023-08-14: 250 mL via INTRAVENOUS
  Filled 2023-08-14: qty 250

## 2023-08-14 MED ORDER — MAGNESIUM SULFATE 2 GM/50ML IV SOLN
2.0000 g | Freq: Once | INTRAVENOUS | Status: AC
  Administered 2023-08-14: 2 g via INTRAVENOUS
  Filled 2023-08-14: qty 50

## 2023-08-14 NOTE — Progress Notes (Signed)
PT Cancellation Note  Patient Details Name: Sherri Gill MRN: [id number] DOB: [birth year]   Cancelled Treatment:    Reason Eval/Treat Not Completed: Medical issues which prohibited therapy, reports abdominal pain, recently had medication. [name] PT Acute Rehabilitation Services Office [phone] Weekend [phone]    [name] 08/14/2023, 9:04 AM

## 2023-08-14 NOTE — Progress Notes (Incomplete)
Palliative Medicine Progress Note   Patient Name: Sherri Gill       Date: 08/13/2023 DOB: [birth year]  Age: 72 y.o. MRN#: [id number] Attending Physician: Glade Lloyd, MD Primary Care Physician: [name], MD Admit Date: 07/24/2023  Reason for Consultation/Follow-up: {Reason for Consult:23484}  HPI/Patient Profile: 72 y.o. female with past medical history of metastatic uterine cancer with peritoneal carcinomatosis on active chemotherapy/immunotherapy, paroxysmal atrial fibrillation on Xarelto, DM, and HTN who was admitted on 07/17/2023 with intractable nausea and vomiting. She was started on TPN due to refractory ileus. Palliative Medicine was consulted for goals of care.    On 10/11, patient had bleeding from PEG tube insertion site and hematemesis. She underwent urgent EGD and remained intubated after the procedure. She was extubated on 10/13.   Subjective: Chart reviewed including progress notes, labs, and imaging.  I met with patient and daughter at bedside. I offered to discuss  to maximize her comfort and quality of life, patient states all this "negative talk" is not  Objective:  Physical Exam          Vital Signs: BP (!) 138/46 (BP Location: Right Arm)   Pulse (!) 101   Temp (!) 97.5 F (36.4 C) (Oral) Comment: thermostat turned to higher temp  Resp (!) 29   Ht 5\' 3"  (1.6 m)   Wt 79.2 kg   SpO2 100%   BMI 30.93 kg/m  SpO2: SpO2: 100 % O2 Device: O2 Device: Nasal Cannula O2 Flow Rate: O2 Flow Rate (L/min): 3 L/min  Intake/output summary:  Intake/Output Summary (Last 24 hours) at 08/13/2023 1836 Last data filed at 08/13/2023 1836 Gross per 24 hour  Intake 1595.59 ml  Output 850 ml  Net 745.59 ml    LBM: Last BM Date : 08/12/23     Palliative  Assessment/Data: ***     Palliative Medicine Assessment & Plan   Assessment: Principal Problem:   Refractory ileus due to carcinomatosis Active Problems:   Essential hypertension   Type 2 diabetes mellitus with neurological complications (HCC)   Uterine cancer (HCC)   Hypokalemia   PAF (paroxysmal atrial fibrillation) (HCC) onset post-op Spring 2023   Malnutrition of moderate degree   Thrush   Hypothyroidism   Pancytopenia (HCC)   Obesity (BMI 30-39.9)    Recommendations/Plan: ***  Goals of Care  and Additional Recommendations: Limitations on Scope of Treatment: {Recommended Scope and Preferences:21019}  Code Status:   Prognosis:  {Palliative Care Prognosis:23504}  Discharge Planning: {Palliative dispostion:23505}  Care plan was discussed with ***  Thank you for allowing the Palliative Medicine Team to assist in the care of this patient.   ***   Merry Proud, NP   Please contact Palliative Medicine Team phone at [phone] for questions and concerns.  For individual providers, please see AMION.

## 2023-08-14 NOTE — Plan of Care (Signed)
  Problem: Education: Goal: Knowledge of General Education information will improve Description: Including pain rating scale, medication(s)/side effects and non-pharmacologic comfort measures Outcome: Progressing   Problem: Health Behavior/Discharge Planning: Goal: Ability to manage health-related needs will improve Outcome: Progressing   Problem: Clinical Measurements: Goal: Ability to maintain clinical measurements within normal limits will improve Outcome: Progressing   Problem: Activity: Goal: Risk for activity intolerance will decrease Outcome: Progressing   Problem: Nutrition: Goal: Adequate nutrition will be maintained Outcome: Progressing   Problem: Elimination: Goal: Will not experience complications related to urinary retention Outcome: Progressing   Problem: Pain Managment: Goal: General experience of comfort will improve Outcome: Progressing   Problem: Safety: Goal: Ability to remain free from injury will improve Outcome: Progressing   Problem: Skin Integrity: Goal: Risk for impaired skin integrity will decrease Outcome: Progressing   Problem: Nutritional: Goal: Maintenance of adequate nutrition will improve Outcome: Progressing   Problem: Clinical Measurements: Goal: Cardiovascular complication will be avoided Outcome: Not Progressing   Problem: Elimination: Goal: Will not experience complications related to bowel motility Outcome: Not Progressing

## 2023-08-14 NOTE — Progress Notes (Signed)
PHARMACY - TOTAL PARENTERAL NUTRITION CONSULT NOTE   Indication: Prolonged ileus  Patient Measurements: Height: 5\' 3"  (160 cm) Weight: 79.2 kg (174 lb 9.7 oz) IBW/kg (Calculated) : 52.4 TPN AdjBW (KG): 58.6 Body mass index is 30.93 kg/m.  Assessment:  [name] admitted 9/16 for nausea/vomiting x 2.5 weeks. Patient with h/o recurrent uterine cancer--Bx taken during 9/18 small bowel enteroscopy but no signs of malignant obstruction, negative for malignancy. Oncology recommended resumption of chemotherapy during this admission--carboplatin and paclitaxel completed 9/25.   Patient has severe malnutrition given inability to keep food down with continued N/V during this admission and PTA. Pharmacy consulted for TPN management.  - Carcinomatosis causing delayed gastric motility   Glucose / Insulin: Hx of T2DM -BG goal < 180. Range: 119 - 166 (8 units SSI/24 hrs) Electrolytes: K 3.6 after 3 runs yesterday; Na 128 - low; Mag 1.7 Renal: SCr WNL, BUN 38  Hepatic: Alk Phos up to 328 (10/17) , AST WNL, ALT slightly elevated - Albumin low  - TG 216 (10/14) , Tbili WNL Intake / Output; MIVF:  -UOP: 450 ml -NG: 1500 ml -no mIVF  GI Imaging: - 9/20 DG UGI: Gastric fold thickening as shown on prior CT and endoscopy. Mildly dilated duodenum, with sluggish flow of contrast through the duodenum--possibility of a more distal SBO is not excluded given this appearance. - 9/29 abd Xray: Slight increase in central small bowel dilatation.   GI Surgeries / Procedures:  - 9/18: small bowel endoscopy: Normal appearing, widely patent esophagus and GEJ. Edematous gastric folds-biopsied. A single duodenal polyp in the post-bulbar duodenum-biopsied - 10/3:  placement of gastrostomy tube today to be used for venting -10/12: PEG tube came out. NG tube placed for stomach decompression, suction   Central access: port TPN start date:  9/26  Nutritional Goals:  Current TPN (1L Clinimix E 8/10) with fat  administration daily provides 80 g protein and 1159 kcal daily   RD Assessment: Estimated Needs Total Energy Estimated Needs: 1650-1850 Total Protein Estimated Needs: 85-100g Total Fluid Estimated Needs: 1.8L/day  Current Nutrition:  TPN 9/26 >> NPO  Plan:  Due to the Baxter IV fluid disruption, we will be switching all adult compounded parenteral nutrition to premade Clinimix products +/- fat emulsion infusion. Our goal will be to continue providing as close to 100% of our patient's nutritional needs. Due to the volatile availability of different Clinimix concentrations we may not be able to meet 100% of adult patient nutritional goals at this time.     Mag 2 gm & KCl 10 mEq IV x 3 runs  Clinimix E 8/10 @ 41.6 ml/hr + fat emulsion 20% 250 ml over 12 hours Add MVI to TPN Continue q6h sSSI TPN labs Mon, Thurs. Check additional labs PRN.   Thank you for allowing pharmacy to be a part of this patient's care.  Herby Abraham, Pharm.D Use secure chat for questions 08/14/2023 7:29 AM

## 2023-08-14 NOTE — Progress Notes (Signed)
PROGRESS NOTE    Sherri Gill  GNF:[id number] DOB: 25-May-1951 DOA: 07/09/2023 PCP: [name], MD   Brief Narrative:  72 year old female with a stage IV uterine cancer status post TAH and LSO with peritoneal carcinomatosis currently on active chemotherapy as an outpatient, persistent A-fib on Xarelto, diabetes mellitus type 2, hypertension presented with persistent nausea and vomiting with intolerance to oral intake.  Hospital course complicated with refractory ileus due to carcinomatosis requiring TPN.  She had PEG placed on 07/31/2023.  On 08/09/2023, patient started bleeding from PEG tube insertion site along with large amount of bloody vomitus.  She underwent EGD on 08/12/2023 which showed oozing gastric ulcer with active bleeding at the site of PEG tube insertion.  She returned to the ICU intubated, sedated.  On 08/09/2023, PEG tube came out; NG tube placed for stomach decompression and suction.  She was extubated on 08/10/2023.  Palliative care and oncology following.  She was made DNR after palliative care meeting on 08/11/2023.  She was transferred back to [facility] service from 08/13/2023 onwards.  Assessment & Plan:   Acute upper GI bleeding due to gastric ulcer/erosion at PEG tube site -Hemostasis not achieved on EGD on 08/27/2023.  Bleeding stabilized with reversal of Xarelto. -Continue Protonix PPI twice daily. -H&H stable.  No evidence of bleeding. -If patient rebleeds: Will need IR embolization if family continues to want aggressive treatment -Xarelto to remain on hold.  Acute respiratory failure postprocedure, resolved -Extubated on 08/10/2023. -On 3 L oxygen via nasal cannula.  Wean off as able.  Refractory ileus Uterine cancer with peritoneal carcinomatosis Goals of care Generalized deconditioning -Palliative care and oncology following.  CODE STATUS has been changed to DNR during this hospitalization by palliative care team. -Not a candidate for further  chemotherapy as per oncology and oncology recommends transitioning to total comfort measures.  Overall prognosis is very poor. -General Surgery has already signed off. -Currently on TPN as per pharmacy.  Continue NPO.  NG tube recently placed when she was intubated -Had venting gastrostomy tube placed on 07/31/2023 by IR. -Patient is not ready for transitioning to hospice yet.  Leukocytosis -Improving.  Monitor  Fever/SIRS, resolving -Continue to monitor off antibiotics.  WBC is trending down.  Afebrile.  Cultures negative so far  Anemia of chronic disease -From chronic illnesses.  Hemoglobin currently stable.  Monitor intermittent  Thrombocytopenia -Resolved  Hypokalemia -Improved   Hyponatremia -Sodium decreasing to 128 today.  Monitor  Hypothyroidism -Oral levothyroxine on hold  Paroxysmal A-fib -Intermittently tachycardic.  Continue IV metoprolol.  Diltiazem on hold.  Xarelto on hold given risk for rebleeding  Anxiety -Off Precedex drip.  Currently on low-dose IV Ativan as needed  DVT prophylaxis: SCDs Code Status: DNR Family Communication: Daughter at bedside Disposition Plan: Status is: Inpatient Remains inpatient appropriate because: Of severity of illness  Consultants: Oncology/palliative care/GI/PCCM/general surgery/IR  Procedures: As above  Antimicrobials:  Anti-infectives (From admission, onward)    Start     Dose/Rate Route Frequency Ordered Stop   08/09/23 1000  vancomycin (VANCOCIN) IVPB 1000 mg/200 mL premix  Status:  Discontinued        1,000 mg 200 mL/hr over 60 Minutes Intravenous Every 12 hours 08/01/2023 2132 08/10/23 0807   08/09/23 0600  ceFEPIme (MAXIPIME) 2 g in sodium chloride 0.9 % 100 mL IVPB  Status:  Discontinued        2 g 200 mL/hr over 30 Minutes Intravenous Every 8 hours 08/12/2023 2132 08/11/23 1132   08/22/2023 2230  vancomycin (VANCOREADY) IVPB 1750 mg/350 mL        1,750 mg 175 mL/hr over 120 Minutes Intravenous  Once 09-05-2023 2122  08/09/23 0117   September 05, 2023 2215  vancomycin (VANCOCIN) IVPB 1000 mg/200 mL premix  Status:  Discontinued        1,000 mg 200 mL/hr over 60 Minutes Intravenous  Once 2023-09-05 2117 Sep 05, 2023 2122   09/05/2023 2130  ceFEPIme (MAXIPIME) 2 g in sodium chloride 0.9 % 100 mL IVPB        2 g 200 mL/hr over 30 Minutes Intravenous NOW 09-05-2023 2117 2023/09/05 2309   07/31/23 1000  ceFAZolin (ANCEF) IVPB 2g/100 mL premix        2 g 200 mL/hr over 30 Minutes Intravenous On call 07/31/23 0912 07/31/23 1126   07/27/23 1800  micafungin (MYCAMINE) 100 mg in sodium chloride 0.9 % 100 mL IVPB  Status:  Discontinued        100 mg 105 mL/hr over 1 Hours Intravenous Every 24 hours 07/27/23 1718 08/01/23 1404        Subjective: Patient seen and examined at bedside.  Having intermittent abdominal pain and nausea. No fever, chest pain reported. Objective: Vitals:   08/13/23 1400 08/13/23 1600 08/13/23 1900 08/14/23 0400  BP: (!) 126/92 (!) 138/46    Pulse:      Resp: (!) 22 (!) 29    Temp:  (!) 97.5 F (36.4 C) 98.3 F (36.8 C) 98.7 F (37.1 C)  TempSrc:  Oral Oral Oral  SpO2: 100% 100%    Weight:      Height:        Intake/Output Summary (Last 24 hours) at 08/14/2023 0717 Last data filed at 08/14/2023 0600 Gross per 24 hour  Intake 1189.04 ml  Output 1950 ml  Net -760.96 ml   Filed Weights   08/09/23 0026 08/10/23 0432 08/11/23 0500  Weight: 77 kg 79.9 kg 79.2 kg    Examination:  General: On 3 L oxygen via nasal cannula.  Looks chronically ill and deconditioned.  No distress ENT/neck: NG tube present.  No thyromegaly.  JVD is not elevated  respiratory: Decreased breath sounds at bases bilaterally with some crackles; no wheezing.  Intermittently tachypneic CVS: S1-S2 heard, rate controlled currently Abdominal: Soft, tender, slightly distended; no organomegaly, sluggish bowel sounds.  Gastrostomy tube in place Extremities: Mild lower extremity edema; no cyanosis  CNS: Alert; still slow to  respond.  No focal neurologic deficit.  Moves extremities Lymph: No obvious lymphadenopathy Skin: No obvious ecchymosis/lesions  psych: Currently not agitated.  Affect is extremely flat.   Musculoskeletal: No obvious joint swelling/deformity     Data Reviewed: I have personally reviewed following labs and imaging studies  CBC: Recent Labs  Lab 2023-09-05 2327 08/09/23 0653 08/11/23 0304 08/11/23 1310 08/12/23 0747 08/13/23 0845 08/14/23 0250  WBC 8.5   < > 18.7* 16.8* 13.9* 11.7* 11.0*  NEUTROABS 7.1  --   --   --   --   --   --   HGB 9.7*   < > 8.3* 8.3* 8.5* 10.0* 9.7*  HCT 30.3*   < > 26.0* 26.0* 26.5* 31.8* 29.4*  MCV 95.0   < > 94.9 94.5 94.3 95.2 94.2  PLT 102*   < > 166 183 220 293 335   < > = values in this interval not displayed.   Basic Metabolic Panel: Recent Labs  Lab September 05, 2023 0601 2023-09-05 1512 08/09/23 0139 08/10/23 0927 08/11/23 0651 08/12/23 0747 08/13/23 0845 08/14/23  0250  NA 130*   < > 132* 133* 133* 131* 132* 128*  K 3.8   < > 3.2* 3.5 3.3* 3.8 3.4* 3.6  CL 99   < > 110 107 110 109 105 102  CO2 21*   < > 14* 18* 18* 17* 17* 17*  GLUCOSE 137*   < > 199* 142* 169* 172* 169* 165*  BUN 31*   < > 43* 40* 30* 29* 32* 38*  CREATININE 0.75   < > 0.82 0.81 0.76 0.70 0.79 0.79  CALCIUM 9.4   < > 7.7* 9.1 9.1 9.6 10.4* 10.2  MG 1.5*  --  1.5* 2.2 2.0  --   --  1.7  PHOS 3.8  --   --   --  3.0  --   --  3.1   < > = values in this interval not displayed.   GFR: Estimated Creatinine Clearance: 65.2 mL/min (by C-G formula based on SCr of 0.79 mg/dL). Liver Function Tests: Recent Labs  Lab 08/09/23 0139 08/11/23 0651 08/12/23 0747 08/13/23 0845 08/14/23 0250  AST 13* 11* 14* 34 39  ALT 18 14 16  44 56*  ALKPHOS 104 119 155* 249* 328*  BILITOT 1.4* 0.5 0.6 0.6 0.4  PROT 5.0* 5.8* 6.6 7.6 7.9  ALBUMIN 2.6* 2.4* 2.5* 2.9* 2.8*   No results for input(s): "LIPASE", "AMYLASE" in the last 168 hours. No results for input(s): "AMMONIA" in the last 168  hours. Coagulation Profile: Recent Labs  Lab 08/04/2023 2215  INR 1.0   Cardiac Enzymes: No results for input(s): "CKTOTAL", "CKMB", "CKMBINDEX", "TROPONINI" in the last 168 hours. BNP (last 3 results) No results for input(s): "PROBNP" in the last 8760 hours. HbA1C: No results for input(s): "HGBA1C" in the last 72 hours. CBG: Recent Labs  Lab 08/13/23 0746 08/13/23 1148 08/13/23 1720 08/14/23 0037 08/14/23 0600  GLUCAP 158* 141* 119* 162* 166*   Lipid Profile: No results for input(s): "CHOL", "HDL", "LDLCALC", "TRIG", "CHOLHDL", "LDLDIRECT" in the last 72 hours.  Thyroid Function Tests: No results for input(s): "TSH", "T4TOTAL", "FREET4", "T3FREE", "THYROIDAB" in the last 72 hours. Anemia Panel: No results for input(s): "VITAMINB12", "FOLATE", "FERRITIN", "TIBC", "IRON", "RETICCTPCT" in the last 72 hours. Sepsis Labs: Recent Labs  Lab 08/05/2023 2327 08/09/23 0139  PROCALCITON 1.85  --   LATICACIDVEN 1.8 1.1    Recent Results (from the past 240 hour(s))  Culture, blood (x 2)     Status: None (Preliminary result)   Collection Time: 08/04/2023 10:15 PM   Specimen: BLOOD  Result Value Ref Range Status   Specimen Description   Final    BLOOD BLOOD RIGHT HAND Performed at [facility], [address], [place], [place] [address]    Special Requests   Final    BOTTLES DRAWN AEROBIC ONLY Blood Culture results may not be optimal due to an inadequate volume of blood received in culture bottles Performed at [facility], [address], [place], [place] [address]    Culture   Final    NO GROWTH 4 DAYS Performed at [facility], [address], [place], [place] [address]    Report Status PENDING  Incomplete  Culture, blood (x 2)     Status: None (Preliminary result)   Collection Time: 08/18/2023 11:27 PM   Specimen: BLOOD RIGHT HAND  Result Value Ref Range Status   Specimen Description   Final    BLOOD RIGHT HAND BOTTLES DRAWN  AEROBIC ONLY Performed  at [facility], [address], [place], [place] [address]    Special Requests   Final    Blood Culture adequate volume Performed at [facility], [address] [place], [place] [address]    Culture   Final    NO GROWTH 4 DAYS Performed at [facility], [address] [place], [place] [address]    Report Status PENDING  Incomplete         Radiology Studies: No results found.      Scheduled Meds:  Chlorhexidine Gluconate Cloth  6 each Topical Daily   dorzolamide-timolol  1 drop Both Eyes BID   insulin aspart  0-9 Units Subcutaneous Q6H   latanoprost  1 drop Both Eyes QHS   metoprolol tartrate  5 mg Intravenous Q6H   pantoprazole (PROTONIX) IV  40 mg Intravenous BID AC   sodium chloride flush  10 mL Intravenous Q12H   sodium chloride flush  10-40 mL Intracatheter Q12H   Continuous Infusions:  TPN (CLINIMIX-E) Adult 41.6 mL/hr at 08/13/23 1823          Glade Lloyd, MD [facility] 08/14/2023, 7:17 AM

## 2023-08-15 ENCOUNTER — Other Ambulatory Visit: Payer: Self-pay

## 2023-08-15 DIAGNOSIS — K567 Ileus, unspecified: Secondary | ICD-10-CM | POA: Diagnosis not present

## 2023-08-15 LAB — CBC
HCT: 31 % — ABNORMAL LOW (ref 36.0–46.0)
Hemoglobin: 9.9 g/dL — ABNORMAL LOW (ref 12.0–15.0)
MCH: 30.3 pg (ref 26.0–34.0)
MCHC: 31.9 g/dL (ref 30.0–36.0)
MCV: 94.8 fL (ref 80.0–100.0)
Platelets: 365 10*3/uL (ref 150–400)
RBC: 3.27 MIL/uL — ABNORMAL LOW (ref 3.87–5.11)
RDW: 15.5 % (ref 11.5–15.5)
WBC: 12.2 10*3/uL — ABNORMAL HIGH (ref 4.0–10.5)
nRBC: 0.2 % (ref 0.0–0.2)

## 2023-08-15 LAB — BASIC METABOLIC PANEL
Anion gap: 8 (ref 5–15)
BUN: 48 mg/dL — ABNORMAL HIGH (ref 8–23)
CO2: 17 mmol/L — ABNORMAL LOW (ref 22–32)
Calcium: 10.6 mg/dL — ABNORMAL HIGH (ref 8.9–10.3)
Chloride: 105 mmol/L (ref 98–111)
Creatinine, Ser: 0.87 mg/dL (ref 0.44–1.00)
GFR, Estimated: >60 mL/min (ref >60)
Glucose, Bld: 169 mg/dL — ABNORMAL HIGH (ref 70–99)
Potassium: 4.1 mmol/L (ref 3.5–5.1)
Sodium: 130 mmol/L — ABNORMAL LOW (ref 135–145)

## 2023-08-15 LAB — GLUCOSE, CAPILLARY
Glucose-Capillary: 161 mg/dL — ABNORMAL HIGH (ref 70–99)
Glucose-Capillary: 163 mg/dL — ABNORMAL HIGH (ref 70–99)
Glucose-Capillary: 163 mg/dL — ABNORMAL HIGH (ref 70–99)
Glucose-Capillary: 164 mg/dL — ABNORMAL HIGH (ref 70–99)
Glucose-Capillary: 165 mg/dL — ABNORMAL HIGH (ref 70–99)

## 2023-08-15 MED ORDER — TRACE MINERALS CU-MN-SE-ZN 300-55-60-3000 MCG/ML IV SOLN
INTRAVENOUS | Status: AC
  Filled 2023-08-15: qty 1000

## 2023-08-15 MED ORDER — PROCHLORPERAZINE EDISYLATE 10 MG/2ML IJ SOLN
5.0000 mg | Freq: Once | INTRAMUSCULAR | Status: AC
  Administered 2023-08-15: 5 mg via INTRAVENOUS
  Filled 2023-08-15: qty 2

## 2023-08-15 MED ORDER — NYSTATIN 100000 UNIT/ML MT SUSP
5.0000 mL | Freq: Four times a day (QID) | OROMUCOSAL | Status: DC
  Administered 2023-08-15 – 2023-08-22 (×14): 500000 [IU] via ORAL
  Filled 2023-08-15 (×15): qty 5

## 2023-08-15 MED ORDER — FAT EMUL FISH OIL/PLANT BASED 20% (SMOFLIPID)IV EMUL
250.0000 mL | INTRAVENOUS | Status: AC
  Administered 2023-08-15: 250 mL via INTRAVENOUS
  Filled 2023-08-15: qty 250

## 2023-08-15 NOTE — Progress Notes (Signed)
Patient experienced episode of emesis, during episode patient brady down to low 30's, recovered quickly. On-call provider paged, and one time add on of compazine added. Patient cleaned up and denies pain. Resting comfortable at this time.

## 2023-08-15 NOTE — Plan of Care (Signed)
  Problem: Education: Goal: Knowledge of General Education information will improve Description: Including pain rating scale, medication(s)/side effects and non-pharmacologic comfort measures Outcome: Progressing   Problem: Health Behavior/Discharge Planning: Goal: Ability to manage health-related needs will improve Outcome: Progressing   Problem: Clinical Measurements: Goal: Ability to maintain clinical measurements within normal limits will improve Outcome: Progressing Goal: Will remain free from infection Outcome: Progressing Goal: Diagnostic test results will improve Outcome: Progressing Goal: Cardiovascular complication will be avoided Outcome: Progressing   Problem: Nutrition: Goal: Adequate nutrition will be maintained Outcome: Not Progressing   Problem: Elimination: Goal: Will not experience complications related to bowel motility Outcome: Not Progressing

## 2023-08-15 NOTE — Progress Notes (Signed)
PROGRESS NOTE    Sherri Gill  ONG:[id number] DOB: 1951-04-22 DOA: Jul 15, 2023 PCP: [name], MD   Brief Narrative:  72 year old female with a stage IV uterine cancer status post TAH and LSO with peritoneal carcinomatosis currently on active chemotherapy as an outpatient, persistent A-fib on Xarelto, diabetes mellitus type 2, hypertension presented with persistent nausea and vomiting with intolerance to oral intake.  Hospital course complicated with refractory ileus due to carcinomatosis requiring TPN.  She had PEG placed on 07/31/2023.  On 07/30/2023, patient started bleeding from PEG tube insertion site along with large amount of bloody vomitus.  She underwent EGD on 07/29/2023 which showed oozing gastric ulcer with active bleeding at the site of PEG tube insertion.  She returned to the ICU intubated, sedated.  On 08/09/2023, PEG tube came out; NG tube placed for stomach decompression and suction.  She was extubated on 08/10/2023.  Palliative care and oncology following.  She was made DNR after palliative care meeting on 08/11/2023.  She was transferred back to [facility] service from 08/13/2023 onwards.  Assessment & Plan:   Acute upper GI bleeding due to gastric ulcer/erosion at PEG tube site -Hemostasis not achieved on EGD on 08/28/2023.  Bleeding stabilized with reversal of Xarelto. -Continue Protonix PPI twice daily. -H&H stable.  No evidence of bleeding. -If patient rebleeds: Will need IR embolization if family continues to want aggressive treatment -Xarelto to remain on hold.  Acute respiratory failure postprocedure, resolved -Extubated on 08/10/2023. -On 2 L oxygen via nasal cannula.  Wean off as able.  Refractory ileus Uterine cancer with peritoneal carcinomatosis Goals of care Generalized deconditioning -Palliative care and oncology following.  CODE STATUS has been changed to DNR during this hospitalization by palliative care team. -Not a candidate for further  chemotherapy as per oncology and oncology recommends transitioning to total comfort measures.  Overall prognosis is very poor. -General Surgery has already signed off. -Currently on TPN as per pharmacy.  Continue NPO.  NG tube accidentally got removed on 08/14/2023 but was replaced subsequently. -Had venting gastrostomy tube placed on 07/31/2023 by IR. -Patient is not ready for transitioning to hospice yet.  Leukocytosis - Monitor  Fever/SIRS, resolving -Continue to monitor off antibiotics.  WBC is trending down.  Afebrile.  Cultures negative so far  Anemia of chronic disease -From chronic illnesses.  Hemoglobin currently stable.  Monitor intermittent  Thrombocytopenia -Resolved  Hypokalemia -Improved   Hyponatremia -Sodium improving to 130 today.  Monitor  Hypothyroidism -Oral levothyroxine on hold  Paroxysmal A-fib -Intermittently tachycardic.  Continue IV metoprolol.  Diltiazem on hold.  Xarelto on hold given risk for rebleeding  Anxiety -Off Precedex drip.  Currently on low-dose IV Ativan as needed  DVT prophylaxis: SCDs Code Status: DNR Family Communication: Daughter and niece at bedside Disposition Plan: Status is: Inpatient Remains inpatient appropriate because: Of severity of illness  Consultants: Oncology/palliative care/GI/PCCM/general surgery/IR  Procedures: As above  Antimicrobials:  Anti-infectives (From admission, onward)    Start     Dose/Rate Route Frequency Ordered Stop   08/09/23 1000  vancomycin (VANCOCIN) IVPB 1000 mg/200 mL premix  Status:  Discontinued        1,000 mg 200 mL/hr over 60 Minutes Intravenous Every 12 hours 08/14/2023 2132 08/10/23 0807   08/09/23 0600  ceFEPIme (MAXIPIME) 2 g in sodium chloride 0.9 % 100 mL IVPB  Status:  Discontinued        2 g 200 mL/hr over 30 Minutes Intravenous Every 8 hours 08/02/2023 2132 08/11/23 1132  Aug 13, 2023 2230  vancomycin (VANCOREADY) IVPB 1750 mg/350 mL        1,750 mg 175 mL/hr over 120 Minutes  Intravenous  Once 2023-08-13 2122 08/09/23 0117   08-13-2023 2215  vancomycin (VANCOCIN) IVPB 1000 mg/200 mL premix  Status:  Discontinued        1,000 mg 200 mL/hr over 60 Minutes Intravenous  Once 13-Aug-2023 2117 08-13-23 2122   08-13-2023 2130  ceFEPIme (MAXIPIME) 2 g in sodium chloride 0.9 % 100 mL IVPB        2 g 200 mL/hr over 30 Minutes Intravenous NOW Aug 13, 2023 2117 13-Aug-2023 2309   07/31/23 1000  ceFAZolin (ANCEF) IVPB 2g/100 mL premix        2 g 200 mL/hr over 30 Minutes Intravenous On call 07/31/23 0912 07/31/23 1126   07/27/23 1800  micafungin (MYCAMINE) 100 mg in sodium chloride 0.9 % 100 mL IVPB  Status:  Discontinued        100 mg 105 mL/hr over 1 Hours Intravenous Every 24 hours 07/27/23 1718 08/01/23 1404        Subjective: Patient seen and examined at bedside.  Complains of severe intermittent abdominal pain with nausea.  No fever, seizure or agitation reported.   Objective: Vitals:   08/15/23 0037 08/15/23 0200 08/15/23 0400 08/15/23 0500  BP:  (!) 128/57 132/72   Pulse:      Resp:  (!) 24 (!) 22   Temp: 97.9 F (36.6 C)  98.6 F (37 C)   TempSrc: Oral  Oral   SpO2:      Weight:    76.1 kg  Height:        Intake/Output Summary (Last 24 hours) at 08/15/2023 0726 Last data filed at 08/15/2023 0333 Gross per 24 hour  Intake 1797.02 ml  Output 1000 ml  Net 797.02 ml   Filed Weights   08/10/23 0432 08/11/23 0500 08/15/23 0500  Weight: 79.9 kg 79.2 kg 76.1 kg    Examination:  General: No acute distress.  On 2 L oxygen via nasal cannula.  Chronically ill and deconditioned looking.   ENT/neck: NG tube is still present.  No palpable neck masses or elevated JVD noted  respiratory: Bilateral decreased breath sounds at bases with scattered crackles and intermittent tachypnea  CVS: Intermittently tachycardic; S1 and S2 are heard.   Abdominal: Soft, still tender, distended mildly; no organomegaly, bowel sounds are sluggish.  Gastrostomy tube in place Extremities: No  clubbing; trace lower extremity edema present CNS: Awake; slow to respond.  No focal neurologic deficit.  Able to move extremities Lymph: No palpable lymphadenopathy noted  skin: No obvious petechiae/rashes  psych: Flat affect.  Not agitated currently.   Musculoskeletal: No obvious joint tenderness/erythema    Data Reviewed: I have personally reviewed following labs and imaging studies  CBC: Recent Labs  Lab 08-13-2023 2327 08/09/23 0653 08/11/23 1310 08/12/23 0747 08/13/23 0845 08/14/23 0250 08/15/23 0259  WBC 8.5   < > 16.8* 13.9* 11.7* 11.0* 12.2*  NEUTROABS 7.1  --   --   --   --   --   --   HGB 9.7*   < > 8.3* 8.5* 10.0* 9.7* 9.9*  HCT 30.3*   < > 26.0* 26.5* 31.8* 29.4* 31.0*  MCV 95.0   < > 94.5 94.3 95.2 94.2 94.8  PLT 102*   < > 183 220 293 335 365   < > = values in this interval not displayed.   Basic Metabolic Panel: Recent Labs  Lab 08/09/23 0139 08/10/23 0927 08/11/23 0651 08/12/23 0747 08/13/23 0845 08/14/23 0250 08/15/23 0259  NA 132* 133* 133* 131* 132* 128* 130*  K 3.2* 3.5 3.3* 3.8 3.4* 3.6 4.1  CL 110 107 110 109 105 102 105  CO2 14* 18* 18* 17* 17* 17* 17*  GLUCOSE 199* 142* 169* 172* 169* 165* 169*  BUN 43* 40* 30* 29* 32* 38* 48*  CREATININE 0.82 0.81 0.76 0.70 0.79 0.79 0.87  CALCIUM 7.7* 9.1 9.1 9.6 10.4* 10.2 10.6*  MG 1.5* 2.2 2.0  --   --  1.7  --   PHOS  --   --  3.0  --   --  3.1  --    GFR: Estimated Creatinine Clearance: 58.8 mL/min (by C-G formula based on SCr of 0.87 mg/dL). Liver Function Tests: Recent Labs  Lab 08/09/23 0139 08/11/23 0651 08/12/23 0747 08/13/23 0845 08/14/23 0250  AST 13* 11* 14* 34 39  ALT 18 14 16  44 56*  ALKPHOS 104 119 155* 249* 328*  BILITOT 1.4* 0.5 0.6 0.6 0.4  PROT 5.0* 5.8* 6.6 7.6 7.9  ALBUMIN 2.6* 2.4* 2.5* 2.9* 2.8*   No results for input(s): "LIPASE", "AMYLASE" in the last 168 hours. No results for input(s): "AMMONIA" in the last 168 hours. Coagulation Profile: Recent Labs  Lab  08/06/2023 2215  INR 1.0   Cardiac Enzymes: No results for input(s): "CKTOTAL", "CKMB", "CKMBINDEX", "TROPONINI" in the last 168 hours. BNP (last 3 results) No results for input(s): "PROBNP" in the last 8760 hours. HbA1C: No results for input(s): "HGBA1C" in the last 72 hours. CBG: Recent Labs  Lab 08/14/23 0600 08/14/23 1156 08/14/23 1808 08/15/23 0007 08/15/23 0609  GLUCAP 166* 160* 146* 165* 163*   Lipid Profile: No results for input(s): "CHOL", "HDL", "LDLCALC", "TRIG", "CHOLHDL", "LDLDIRECT" in the last 72 hours.  Thyroid Function Tests: No results for input(s): "TSH", "T4TOTAL", "FREET4", "T3FREE", "THYROIDAB" in the last 72 hours. Anemia Panel: No results for input(s): "VITAMINB12", "FOLATE", "FERRITIN", "TIBC", "IRON", "RETICCTPCT" in the last 72 hours. Sepsis Labs: Recent Labs  Lab 08/14/2023 2327 08/09/23 0139  PROCALCITON 1.85  --   LATICACIDVEN 1.8 1.1    Recent Results (from the past 240 hour(s))  Culture, blood (x 2)     Status: None   Collection Time: 08/09/2023 10:15 PM   Specimen: BLOOD  Result Value Ref Range Status   Specimen Description   Final    BLOOD BLOOD RIGHT HAND Performed at [facility], [address], [place], [place] [address]    Special Requests   Final    BOTTLES DRAWN AEROBIC ONLY Blood Culture results may not be optimal due to an inadequate volume of blood received in culture bottles Performed at [facility], [address], [place], [place] [address]    Culture   Final    NO GROWTH 5 DAYS Performed at [facility], [address], [place], [place] [address]    Report Status 08/14/2023 FINAL  Final  Culture, blood (x 2)     Status: None   Collection Time: 08/02/2023 11:27 PM   Specimen: BLOOD RIGHT HAND  Result Value Ref Range Status   Specimen Description   Final    BLOOD RIGHT HAND BOTTLES DRAWN AEROBIC ONLY Performed at [facility], [address]  [place], [place] [address]    Special Requests   Final    Blood Culture adequate volume Performed at [facility], [address] [place], [place] [address]    Culture   Final    NO GROWTH 5 DAYS Performed at [facility], [address] [place], [place] [address]    Report Status 08/14/2023 FINAL  Final         Radiology Studies: DG Abd 1 View  Result Date: 08/14/2023 CLINICAL DATA:  NG tube placement EXAM: ABDOMEN - 1 VIEW COMPARISON:  08/09/2023, CT 08/09/2023 FINDINGS: Partially visualized right-sided port tip at the right atrium. Atypical course of esophageal tube with the tip directed to the patient's left, near T tacks previously noted on abdominal CT. Nonobstructed gas pattern IMPRESSION: Atypical course of esophageal tube with the tip directed to the patient's left, near T tacks previously noted on abdominal CT. Tip location is uncertain, question within previously noted gastrostomy tube tract. Recommend CT to more definitively determine the position of the esophageal tube tip. These results will be called to the ordering clinician or representative by the Radiologist Assistant, and communication documented in the PACS or [employer]. Electronically Signed   By: Jasmine Pang M.D.   On: 08/14/2023 15:28        Scheduled Meds:  Chlorhexidine Gluconate Cloth  6 each Topical Daily   dorzolamide-timolol  1 drop Both Eyes BID   insulin aspart  0-9 Units Subcutaneous Q6H   latanoprost  1 drop Both Eyes QHS   metoprolol tartrate  5 mg Intravenous Q6H   pantoprazole (PROTONIX) IV  40 mg Intravenous BID AC   sodium chloride flush  10 mL Intravenous Q12H   sodium chloride flush  10-40 mL Intracatheter Q12H   Continuous Infusions:  TPN (CLINIMIX-E) Adult 41.7 mL/hr at 08/15/23 0333          Glade Lloyd, MD [facility] 08/15/2023, 7:26 AM

## 2023-08-15 NOTE — Progress Notes (Signed)
Chaplain visited pt in response to request from another chaplain. Pt was alert for part of this visit, her friend, Egbal was at her bedside. Pt and Egbal requested prayer, pt drifted to sleep during my prayer. Pt experienced pain during this visit, I communicated her need for more pain medicine to her nurse, who provided it quickly. Ongoing spiritual support needed.  Chaplain [place], [place]   08/15/23 1600  Spiritual Encounters  Type of Visit Follow up  Care provided to: Patient;Friend  Conversation partners present during encounter Nurse  Referral source Chaplain team  Reason for visit Routine spiritual support  OnCall Visit No  Spiritual Framework  Presenting Themes Courage hope and growth  Community/Connection Family;Friend(s);Faith community  Patient Stress Factors Health changes  Interventions  Spiritual Care Interventions Made Prayer;Encouragement;Compassionate presence  Intervention Outcomes  Outcomes Reduced anxiety;Connection to spiritual care

## 2023-08-15 NOTE — Plan of Care (Signed)
[facility] Tonsillectomy/Adenoidectomy, Postoperative PEDS care plan entered in error.

## 2023-08-15 NOTE — Progress Notes (Signed)
Chaplain visited pt while rounding on unit, pt was not alert. Her close friend, daughter and granddaughter were present. I made this visit brief as they had brought a meal to enjoy in pt's room. I provided reflective listening, a non anxious presence and encouragement. Follow up visit will be made on 10/18.  Chaplain [place], [place].

## 2023-08-15 NOTE — Progress Notes (Signed)
PT Cancellation Note  Patient Details Name: Sherri Gill MRN: [id number] DOB: [birth year]   Cancelled Treatment:    Reason Eval/Treat Not Completed: Medical issues which prohibited therapy (Per RN, pt is not appropriate for PT today, she's feeling well, she's very nauseous, awaiting PICC placement. Will follow.)  Tamala Ser PT 08/15/2023  Acute Rehabilitation Services  Office [phone]

## 2023-08-15 NOTE — Progress Notes (Signed)
Peripherally Inserted Central Catheter Placement  The IV Nurse has discussed with the patient and/or persons authorized to consent for the patient, the purpose of this procedure and the potential benefits and risks involved with this procedure.  The benefits include less needle sticks, lab draws from the catheter, and the patient may be discharged home with the catheter. Risks include, but not limited to, infection, bleeding, blood clot (thrombus formation), and puncture of an artery; nerve damage and irregular heartbeat and possibility to perform a PICC exchange if needed/ordered by physician.  Alternatives to this procedure were also discussed.  Bard Power PICC patient education guide, fact sheet on infection prevention and patient information card has been provided to patient /or left at bedside.    PICC Placement Documentation  PICC Double Lumen 08/15/23 Right Basilic 38 cm 0 cm (Active)  Indication for Insertion or Continuance of Line Prolonged intravenous therapies;Administration of hyperosmolar/irritating solutions (i.e. TPN, Vancomycin, etc.);Poor Vasculature-patient has had multiple peripheral attempts or PIVs lasting less than 24 hours 08/15/23 1806  Exposed Catheter (cm) 0 cm 08/15/23 1806  Site Assessment Clean, Dry, Intact 08/15/23 1806  Lumen #1 Status Flushed;Saline locked;Blood return noted 08/15/23 1806  Lumen #2 Status Flushed;Saline locked;Blood return noted 08/15/23 1806  Dressing Type Transparent;Securing device 08/15/23 1806  Dressing Status Antimicrobial disc in place;Clean, Dry, Intact 08/15/23 1806  Line Care Connections checked and tightened 08/15/23 1806  Dressing Intervention New dressing 08/15/23 1806  Dressing Change Due 08/22/23 08/15/23 1806       Ellenora Talton, Lajean Manes 08/15/2023, 6:06 PM

## 2023-08-15 NOTE — Progress Notes (Signed)
PHARMACY - TOTAL PARENTERAL NUTRITION CONSULT NOTE   Indication: Prolonged ileus  Patient Measurements: Height: 5\' 3"  (160 cm) Weight: 76.1 kg (167 lb 12.3 oz) IBW/kg (Calculated) : 52.4 TPN AdjBW (KG): 58.6 Body mass index is 29.72 kg/m.  Assessment:  [name] admitted 9/16 for nausea/vomiting x 2.5 weeks. Patient with h/o recurrent uterine cancer--Bx taken during 9/18 small bowel enteroscopy but no signs of malignant obstruction, negative for malignancy. Oncology recommended resumption of chemotherapy during this admission--carboplatin and paclitaxel completed 9/25.   Patient has severe malnutrition given inability to keep food down with continued N/V during this admission and PTA. Pharmacy consulted for TPN management.  - Carcinomatosis causing delayed gastric motility   Glucose / Insulin: Hx of T2DM -BG goal < 180. Range: 146 - 165 (7 units SSI/24 hrs) Electrolytes: K 4.1 after 3 runs yesterday; Na 130 - low, Ca 10.6, CorCa 11.56 - slightly elevated Renal: SCr WNL, BUN 48  Hepatic: Alk Phos up to 328 (10/17) , AST WNL, ALT slightly elevated - Albumin low  - TG 216 (10/14) , Tbili WNL Intake / Output; MIVF:  -UOP: 400 ml -NG: 600 ml -no mIVF  GI Imaging: - 9/20 DG UGI: Gastric fold thickening as shown on prior CT and endoscopy. Mildly dilated duodenum, with sluggish flow of contrast through the duodenum--possibility of a more distal SBO is not excluded given this appearance. - 9/29 abd Xray: Slight increase in central small bowel dilatation.   GI Surgeries / Procedures:  - 9/18: small bowel endoscopy: Normal appearing, widely patent esophagus and GEJ. Edematous gastric folds-biopsied. A single duodenal polyp in the post-bulbar duodenum-biopsied - 10/3:  placement of gastrostomy tube today to be used for venting -10/12: PEG tube came out. NG tube placed for stomach decompression, suction   Central access: port TPN start date:  9/26  Nutritional Goals:  Current TPN (1L  Clinimix E 8/10) with fat administration daily provides 80 g protein and 1159 kcal daily   RD Assessment: Estimated Needs Total Energy Estimated Needs: 1650-1850 Total Protein Estimated Needs: 85-100g Total Fluid Estimated Needs: 1.8L/day  Current Nutrition:  TPN 9/26 >> NPO  Plan:  Due to the Baxter IV fluid disruption, we will be switching all adult compounded parenteral nutrition to premade Clinimix products +/- fat emulsion infusion. Our goal will be to continue providing as close to 100% of our patient's nutritional needs. Due to the volatile availability of different Clinimix concentrations we may not be able to meet 100% of adult patient nutritional goals at this time.     Clinimix E 8/10 @ 41.6 ml/hr + fat emulsion 20% 250 ml over 12 hours Add MVI to TPN Continue q6h sSSI TPN labs Mon, Thurs. Check additional labs PRN.   Thank you for allowing pharmacy to be a part of this patient's care.  Herby Abraham, Pharm.D Use secure chat for questions 08/15/2023 10:30 AM

## 2023-08-16 ENCOUNTER — Inpatient Hospital Stay (HOSPITAL_COMMUNITY): Payer: Medicare HMO

## 2023-08-16 DIAGNOSIS — K567 Ileus, unspecified: Secondary | ICD-10-CM | POA: Diagnosis not present

## 2023-08-16 LAB — GLUCOSE, CAPILLARY
Glucose-Capillary: 158 mg/dL — ABNORMAL HIGH (ref 70–99)
Glucose-Capillary: 160 mg/dL — ABNORMAL HIGH (ref 70–99)
Glucose-Capillary: 179 mg/dL — ABNORMAL HIGH (ref 70–99)
Glucose-Capillary: 179 mg/dL — ABNORMAL HIGH (ref 70–99)

## 2023-08-16 MED ORDER — METOCLOPRAMIDE HCL 5 MG/ML IJ SOLN
10.0000 mg | Freq: Once | INTRAMUSCULAR | Status: AC
  Administered 2023-08-16: 10 mg via INTRAVENOUS
  Filled 2023-08-16: qty 2

## 2023-08-16 MED ORDER — LORAZEPAM 2 MG/ML IJ SOLN
0.5000 mg | INTRAMUSCULAR | Status: DC | PRN
  Administered 2023-08-16 – 2023-08-17 (×5): 0.5 mg via INTRAVENOUS
  Filled 2023-08-16 (×5): qty 1

## 2023-08-16 MED ORDER — DIPHENHYDRAMINE HCL 50 MG/ML IJ SOLN
25.0000 mg | Freq: Once | INTRAMUSCULAR | Status: AC
  Administered 2023-08-16: 25 mg via INTRAVENOUS
  Filled 2023-08-16: qty 1

## 2023-08-16 MED ORDER — TRACE MINERALS CU-MN-SE-ZN 300-55-60-3000 MCG/ML IV SOLN
INTRAVENOUS | Status: AC
  Filled 2023-08-16 (×2): qty 1000

## 2023-08-16 MED ORDER — PROCHLORPERAZINE EDISYLATE 10 MG/2ML IJ SOLN
10.0000 mg | Freq: Four times a day (QID) | INTRAMUSCULAR | Status: DC | PRN
  Administered 2023-08-16 – 2023-08-25 (×24): 10 mg via INTRAVENOUS
  Filled 2023-08-16 (×25): qty 2

## 2023-08-16 MED ORDER — FAT EMUL FISH OIL/PLANT BASED 20% (SMOFLIPID)IV EMUL
250.0000 mL | INTRAVENOUS | Status: AC
  Administered 2023-08-16: 250 mL via INTRAVENOUS
  Filled 2023-08-16: qty 250

## 2023-08-16 NOTE — Plan of Care (Signed)
  Problem: Elimination: Goal: Will not experience complications related to urinary retention Outcome: Progressing   Problem: Pain Managment: Goal: General experience of comfort will improve Outcome: Progressing Note: Maintaining comfort with PRN medications.   Problem: Safety: Goal: Ability to remain free from injury will improve Outcome: Progressing Note: Maintaining two person assist on transfer to [facility].    Problem: Metabolic: Goal: Ability to maintain appropriate glucose levels will improve Outcome: Progressing Note: Sliding scale insulin is keeping blood sugars in range during TPN feedings.

## 2023-08-16 NOTE — Plan of Care (Signed)
  Problem: Education: Goal: Knowledge of General Education information will improve Description: Including pain rating scale, medication(s)/side effects and non-pharmacologic comfort measures Outcome: Progressing   Problem: Health Behavior/Discharge Planning: Goal: Ability to manage health-related needs will improve Outcome: Progressing   Problem: Clinical Measurements: Goal: Ability to maintain clinical measurements within normal limits will improve Outcome: Progressing Goal: Will remain free from infection Outcome: Progressing   Problem: Activity: Goal: Risk for activity intolerance will decrease Outcome: Progressing   Problem: Nutrition: Goal: Adequate nutrition will be maintained Outcome: Not Progressing   Problem: Elimination: Goal: Will not experience complications related to bowel motility Outcome: Not Progressing   Problem: Pain Managment: Goal: General experience of comfort will improve Outcome: Not Progressing

## 2023-08-16 NOTE — Progress Notes (Signed)
PHARMACY - TOTAL PARENTERAL NUTRITION CONSULT NOTE   Indication: Prolonged ileus  Patient Measurements: Height: 5\' 3"  (160 cm) Weight: 76.3 kg (168 lb 3.4 oz) IBW/kg (Calculated) : 52.4 TPN AdjBW (KG): 58.6 Body mass index is 29.8 kg/m.  Assessment:  [name] admitted 9/16 for nausea/vomiting x 2.5 weeks. Patient with h/o recurrent uterine cancer--Bx taken during 9/18 small bowel enteroscopy but no signs of malignant obstruction, negative for malignancy. Oncology recommended resumption of chemotherapy during this admission--carboplatin and paclitaxel completed 9/25.   Patient has severe malnutrition given inability to keep food down with continued N/V during this admission and PTA. Pharmacy consulted for TPN management.  - Carcinomatosis causing delayed gastric motility   Glucose / Insulin: Hx of T2DM -BG goal < 180. Range: 161 - 179 (8 units SSI/24 hrs) Electrolytes: no labs today.  On 10/18 K 4.1, Na 130 - low, Ca 10.6, CorCa 11.56 - slightly elevated Renal: SCr WNL, BUN 48 on 10/18 Hepatic: Alk Phos up to 328 (10/17) , AST WNL, ALT slightly elevated - Albumin low  - TG 216 (10/14) , Tbili WNL Intake / Output; MIVF:  -UOP: 350 ml -NG: 1300 ml -no mIVF  GI Imaging: - 9/20 DG UGI: Gastric fold thickening as shown on prior CT and endoscopy. Mildly dilated duodenum, with sluggish flow of contrast through the duodenum--possibility of a more distal SBO is not excluded given this appearance. - 9/29 abd Xray: Slight increase in central small bowel dilatation.   GI Surgeries / Procedures:  - 9/18: small bowel endoscopy: Normal appearing, widely patent esophagus and GEJ. Edematous gastric folds-biopsied. A single duodenal polyp in the post-bulbar duodenum-biopsied - 10/3:  placement of gastrostomy tube today to be used for venting -10/12: PEG tube came out. NG tube placed for stomach decompression, suction   Central access: port TPN start date:  9/26  Nutritional Goals:   Current TPN (1L Clinimix E 8/10) with fat administration daily provides 80 g protein and 1159 kcal daily   RD Assessment: Estimated Needs Total Energy Estimated Needs: 1650-1850 Total Protein Estimated Needs: 85-100g Total Fluid Estimated Needs: 1.8L/day  Current Nutrition:  TPN 9/26 >> NPO  Plan:  Due to the Baxter IV fluid disruption, we will be switching all adult compounded parenteral nutrition to premade Clinimix products +/- fat emulsion infusion. Our goal will be to continue providing as close to 100% of our patient's nutritional needs. Due to the volatile availability of different Clinimix concentrations we may not be able to meet 100% of adult patient nutritional goals at this time.     Clinimix E 8/10 @ 41.6 ml/hr + fat emulsion 20% 250 ml over 12 hours Add MVI to TPN Continue q6h sSSI TPN labs Mon, Thurs. Check additional labs PRN.   Thank you for allowing pharmacy to be a part of this patient's care.  Herby Abraham, Pharm.D Use secure chat for questions 08/16/2023 7:17 AM

## 2023-08-16 NOTE — Progress Notes (Signed)
PROGRESS NOTE    Sherri Gill  NWG:[id number] DOB: 1950-11-12 DOA: 07/28/2023 PCP: [name], MD   Brief Narrative:  72 year old female with a stage IV uterine cancer status post TAH and LSO with peritoneal carcinomatosis currently on active chemotherapy as an outpatient, persistent A-fib on Xarelto, diabetes mellitus type 2, hypertension presented with persistent nausea and vomiting with intolerance to oral intake.  Hospital course complicated with refractory ileus due to carcinomatosis requiring TPN.  She had PEG placed on 07/31/2023.  On 08/09/2023, patient started bleeding from PEG tube insertion site along with large amount of bloody vomitus.  She underwent EGD on 08/28/2023 which showed oozing gastric ulcer with active bleeding at the site of PEG tube insertion.  She returned to the ICU intubated, sedated.  On 08/09/2023, PEG tube came out; NG tube placed for stomach decompression and suction.  She was extubated on 08/10/2023.  Palliative care and oncology following.  She was made DNR after palliative care meeting on 08/11/2023.  She was transferred back to [facility] service from 08/13/2023 onwards.  Assessment & Plan:   Acute upper GI bleeding due to gastric ulcer/erosion at PEG tube site -Hemostasis not achieved on EGD on 08/21/2023.  Bleeding stabilized with reversal of Xarelto. -Continue Protonix PPI twice daily. -H&H stable.  No evidence of bleeding. -If patient rebleeds: Will need IR embolization if family continues to want aggressive treatment -Xarelto to remain on hold.  Acute respiratory failure postprocedure, resolved -Extubated on 08/10/2023. -On 2 L oxygen via nasal cannula.  Wean off as able.  Refractory ileus Uterine cancer with peritoneal carcinomatosis Goals of care Generalized deconditioning -Palliative care and oncology following.  CODE STATUS has been changed to DNR during this hospitalization by palliative care team. -Not a candidate for further  chemotherapy as per oncology and oncology recommends transitioning to total comfort measures.  Overall prognosis is very poor. -General Surgery has already signed off. -Currently on TPN as per pharmacy.  Continue NPO.  NG tube accidentally got removed on 08/14/2023 but was replaced subsequently. -Had venting gastrostomy tube placed on 07/31/2023 by IR. -Patient is not ready for transitioning to hospice yet.  Will prognosis is very poor.  Leukocytosis - Monitor intermittently.  Fever/SIRS, resolving -Continue to monitor off antibiotics.  WBC is trending down.  Afebrile.  Cultures negative so far  Anemia of chronic disease -From chronic illnesses.  Hemoglobin currently stable.  Monitor intermittent  Thrombocytopenia -Resolved  Hypokalemia -Improved   Hyponatremia -Sodium improving to 130 on 08/15/2023.  Monitor intermittently.  Hypothyroidism -Oral levothyroxine on hold  Paroxysmal A-fib -Intermittently tachycardic.  Continue IV metoprolol.  Diltiazem on hold.  Xarelto on hold given risk for rebleeding  Anxiety -Off Precedex drip.  Currently on low-dose IV Ativan as needed  DVT prophylaxis: SCDs Code Status: DNR Family Communication: Daughter at bedside Disposition Plan: Status is: Inpatient Remains inpatient appropriate because: Of severity of illness  Consultants: Oncology/palliative care/GI/PCCM/general surgery/IR  Procedures: As above  Antimicrobials:  Anti-infectives (From admission, onward)    Start     Dose/Rate Route Frequency Ordered Stop   08/09/23 1000  vancomycin (VANCOCIN) IVPB 1000 mg/200 mL premix  Status:  Discontinued        1,000 mg 200 mL/hr over 60 Minutes Intravenous Every 12 hours 08/04/2023 2132 08/10/23 0807   08/09/23 0600  ceFEPIme (MAXIPIME) 2 g in sodium chloride 0.9 % 100 mL IVPB  Status:  Discontinued        2 g 200 mL/hr over 30 Minutes Intravenous  Every 8 hours 08/02/2023 2132 08/11/23 1132   08/15/2023 2230  vancomycin (VANCOREADY) IVPB  1750 mg/350 mL        1,750 mg 175 mL/hr over 120 Minutes Intravenous  Once 08/19/2023 2122 08/09/23 0117   08/06/2023 2215  vancomycin (VANCOCIN) IVPB 1000 mg/200 mL premix  Status:  Discontinued        1,000 mg 200 mL/hr over 60 Minutes Intravenous  Once 08/14/2023 2117 08/27/2023 2122   08/20/2023 2130  ceFEPIme (MAXIPIME) 2 g in sodium chloride 0.9 % 100 mL IVPB        2 g 200 mL/hr over 30 Minutes Intravenous NOW 08/24/2023 2117 08/19/2023 2309   07/31/23 1000  ceFAZolin (ANCEF) IVPB 2g/100 mL premix        2 g 200 mL/hr over 30 Minutes Intravenous On call 07/31/23 0912 07/31/23 1126   07/27/23 1800  micafungin (MYCAMINE) 100 mg in sodium chloride 0.9 % 100 mL IVPB  Status:  Discontinued        100 mg 105 mL/hr over 1 Hours Intravenous Every 24 hours 07/27/23 1718 08/01/23 1404        Subjective: Patient seen and examined at bedside.  No agitation, seizures, fever reported.  Still complains of severe intermittent abdominal pain with nausea. Objective: Vitals:   08/16/23 0300 08/16/23 0400 08/16/23 0500 08/16/23 0600  BP: 131/61 124/63 134/63 128/64  Pulse: 95 96    Resp: (!) 22 (!) 25 (!) 23 (!) 23  Temp:      TempSrc:      SpO2: 99% 96%    Weight:   76.3 kg   Height:        Intake/Output Summary (Last 24 hours) at 08/16/2023 0748 Last data filed at 08/16/2023 0630 Gross per 24 hour  Intake 748.73 ml  Output 1650 ml  Net -901.27 ml   Filed Weights   08/11/23 0500 08/15/23 0500 08/16/23 0500  Weight: 79.2 kg 76.1 kg 76.3 kg    Examination:  General: Remains on 2 L oxygen management.  No distress.  Chronically ill and deconditioned looking.   ENT/neck: NG tube is still present.  No obvious thyromegaly or JVD elevation noted respiratory: Decreased breath sounds at bases bilaterally with tachypnea and some crackles  CVS: S1-S2 heard; rate controlled currently  abdominal: Soft, remains tender and distended; no organomegaly, sluggish bowel sounds.  Gastrostomy tube in  place Extremities: Mild lower extremity edema present with no cyanosis  CNS: Alert; still slow to respond.  No focal deficits noted  lymph: No palpable cervical lymphadenopathy noted  skin: No obvious ecchymosis/lesions psych: Extremely flat affect.  Showing no signs of agitation Musculoskeletal: No obvious joint deformity/tenderness  Data Reviewed: I have personally reviewed following labs and imaging studies  CBC: Recent Labs  Lab 08/11/23 1310 08/12/23 0747 08/13/23 0845 08/14/23 0250 08/15/23 0259  WBC 16.8* 13.9* 11.7* 11.0* 12.2*  HGB 8.3* 8.5* 10.0* 9.7* 9.9*  HCT 26.0* 26.5* 31.8* 29.4* 31.0*  MCV 94.5 94.3 95.2 94.2 94.8  PLT 183 220 293 335 365   Basic Metabolic Panel: Recent Labs  Lab 08/10/23 0927 08/11/23 0651 08/12/23 0747 08/13/23 0845 08/14/23 0250 08/15/23 0259  NA 133* 133* 131* 132* 128* 130*  K 3.5 3.3* 3.8 3.4* 3.6 4.1  CL 107 110 109 105 102 105  CO2 18* 18* 17* 17* 17* 17*  GLUCOSE 142* 169* 172* 169* 165* 169*  BUN 40* 30* 29* 32* 38* 48*  CREATININE 0.81 0.76 0.70 0.79 0.79 0.87  CALCIUM 9.1 9.1 9.6 10.4* 10.2 10.6*  MG 2.2 2.0  --   --  1.7  --   PHOS  --  3.0  --   --  3.1  --    GFR: Estimated Creatinine Clearance: 58.9 mL/min (by C-G formula based on SCr of 0.87 mg/dL). Liver Function Tests: Recent Labs  Lab 08/11/23 0651 08/12/23 0747 08/13/23 0845 08/14/23 0250  AST 11* 14* 34 39  ALT 14 16 44 56*  ALKPHOS 119 155* 249* 328*  BILITOT 0.5 0.6 0.6 0.4  PROT 5.8* 6.6 7.6 7.9  ALBUMIN 2.4* 2.5* 2.9* 2.8*   No results for input(s): "LIPASE", "AMYLASE" in the last 168 hours. No results for input(s): "AMMONIA" in the last 168 hours. Coagulation Profile: No results for input(s): "INR", "PROTIME" in the last 168 hours.  Cardiac Enzymes: No results for input(s): "CKTOTAL", "CKMB", "CKMBINDEX", "TROPONINI" in the last 168 hours. BNP (last 3 results) No results for input(s): "PROBNP" in the last 8760 hours. HbA1C: No results for  input(s): "HGBA1C" in the last 72 hours. CBG: Recent Labs  Lab 08/15/23 0609 08/15/23 1150 08/15/23 1823 08/15/23 2333 08/16/23 0504  GLUCAP 163* 164* 161* 163* 179*   Lipid Profile: No results for input(s): "CHOL", "HDL", "LDLCALC", "TRIG", "CHOLHDL", "LDLDIRECT" in the last 72 hours.  Thyroid Function Tests: No results for input(s): "TSH", "T4TOTAL", "FREET4", "T3FREE", "THYROIDAB" in the last 72 hours. Anemia Panel: No results for input(s): "VITAMINB12", "FOLATE", "FERRITIN", "TIBC", "IRON", "RETICCTPCT" in the last 72 hours. Sepsis Labs: No results for input(s): "PROCALCITON", "LATICACIDVEN" in the last 168 hours.   Recent Results (from the past 240 hour(s))  Culture, blood (x 2)     Status: None   Collection Time: 08/04/2023 10:15 PM   Specimen: BLOOD  Result Value Ref Range Status   Specimen Description   Final    BLOOD BLOOD RIGHT HAND Performed at [facility], [address] [place], [place] [address]    Special Requests   Final    BOTTLES DRAWN AEROBIC ONLY Blood Culture results may not be optimal due to an inadequate volume of blood received in culture bottles Performed at [facility], [address], [place], [place] [address]    Culture   Final    NO GROWTH 5 DAYS Performed at [facility], [address], [place], [place] [address]    Report Status 08/14/2023 FINAL  Final  Culture, blood (x 2)     Status: None   Collection Time: 07/31/2023 11:27 PM   Specimen: BLOOD RIGHT HAND  Result Value Ref Range Status   Specimen Description   Final    BLOOD RIGHT HAND BOTTLES DRAWN AEROBIC ONLY Performed at [facility], [address] [place], [place] [address]    Special Requests   Final    Blood Culture adequate volume Performed at [facility], [address] [place], [place] [address]    Culture   Final    NO GROWTH 5 DAYS Performed at [facility], [address], [place], [place] [address]    Report Status 08/14/2023 FINAL  Final         Radiology Studies: [place] EKG SITE RITE  Result Date: 08/15/2023 If Site Rite image not attached, placement could not be confirmed due to current cardiac rhythm.  DG Abd 1 View  Result Date: 08/14/2023 CLINICAL DATA:  NG tube placement EXAM: ABDOMEN - 1 VIEW COMPARISON:  08/09/2023,  CT 08/09/2023 FINDINGS: Partially visualized right-sided port tip at the right atrium. Atypical course of esophageal tube with the tip directed to the patient's left, near T tacks previously noted on abdominal CT. Nonobstructed gas pattern IMPRESSION: Atypical course of esophageal tube with the tip directed to the patient's left, near T tacks previously noted on abdominal CT. Tip location is uncertain, question within previously noted gastrostomy tube tract. Recommend CT to more definitively determine the position of the esophageal tube tip. These results will be called to the ordering clinician or representative by the Radiologist Assistant, and communication documented in the PACS or [employer]. Electronically Signed   By: Jasmine Pang M.D.   On: 08/14/2023 15:28        Scheduled Meds:  Chlorhexidine Gluconate Cloth  6 each Topical Daily   dorzolamide-timolol  1 drop Both Eyes BID   insulin aspart  0-9 Units Subcutaneous Q6H   latanoprost  1 drop Both Eyes QHS   metoprolol tartrate  5 mg Intravenous Q6H   nystatin  5 mL Oral QID   pantoprazole (PROTONIX) IV  40 mg Intravenous BID AC   sodium chloride flush  10 mL Intravenous Q12H   sodium chloride flush  10-40 mL Intracatheter Q12H   Continuous Infusions:  TPN (CLINIMIX-E) Adult 41.6 mL/hr at 08/16/23 1610          Glade Lloyd, MD [facility] 08/16/2023, 7:48 AM

## 2023-08-17 DIAGNOSIS — K311 Adult hypertrophic pyloric stenosis: Secondary | ICD-10-CM | POA: Diagnosis not present

## 2023-08-17 DIAGNOSIS — C55 Malignant neoplasm of uterus, part unspecified: Secondary | ICD-10-CM | POA: Diagnosis not present

## 2023-08-17 DIAGNOSIS — K567 Ileus, unspecified: Secondary | ICD-10-CM | POA: Diagnosis not present

## 2023-08-17 DIAGNOSIS — R112 Nausea with vomiting, unspecified: Secondary | ICD-10-CM | POA: Diagnosis not present

## 2023-08-17 LAB — GLUCOSE, CAPILLARY
Glucose-Capillary: 175 mg/dL — ABNORMAL HIGH (ref 70–99)
Glucose-Capillary: 174 mg/dL — ABNORMAL HIGH (ref 70–99)
Glucose-Capillary: 169 mg/dL — ABNORMAL HIGH (ref 70–99)

## 2023-08-17 MED ORDER — ORAL CARE MOUTH RINSE: 15.0000 mL | OROMUCOSAL | Status: DC | PRN

## 2023-08-17 MED ORDER — SODIUM CHLORIDE 0.9 % IV SOLN
12.5000 mg | Freq: Once | INTRAVENOUS | Status: DC
Start: 2023-08-17 — End: 2023-08-17

## 2023-08-17 MED ORDER — FAT EMUL FISH OIL/PLANT BASED 20% (SMOFLIPID)IV EMUL
250.0000 mL | INTRAVENOUS | Status: AC
  Administered 2023-08-17: 250 mL via INTRAVENOUS
  Filled 2023-08-17: qty 250

## 2023-08-17 MED ORDER — LORAZEPAM 2 MG/ML IJ SOLN
1.0000 mg | INTRAMUSCULAR | Status: DC | PRN
  Administered 2023-08-17 – 2023-08-23 (×16): 1 mg via INTRAVENOUS
  Filled 2023-08-17 (×16): qty 1

## 2023-08-17 MED ORDER — TRACE MINERALS CU-MN-SE-ZN 300-55-60-3000 MCG/ML IV SOLN
INTRAVENOUS | Status: AC
  Filled 2023-08-17 (×2): qty 1000

## 2023-08-17 NOTE — Plan of Care (Signed)
  Problem: Education: Goal: Knowledge of General Education information will improve Description: Including pain rating scale, medication(s)/side effects and non-pharmacologic comfort measures Outcome: Not Progressing   

## 2023-08-17 NOTE — Plan of Care (Signed)
  Problem: Clinical Measurements: Goal: Will remain free from infection Outcome: Progressing   Problem: Pain Managment: Goal: General experience of comfort will improve Outcome: Progressing   Problem: Skin Integrity: Goal: Risk for impaired skin integrity will decrease Outcome: Progressing   Problem: Metabolic: Goal: Ability to maintain appropriate glucose levels will improve Outcome: Progressing

## 2023-08-17 NOTE — Progress Notes (Signed)
PHARMACY - TOTAL PARENTERAL NUTRITION CONSULT NOTE   Indication: Prolonged ileus  Patient Measurements: Height: 5\' 3"  (160 cm) Weight: 76.3 kg (168 lb 3.4 oz) IBW/kg (Calculated) : 52.4 TPN AdjBW (KG): 58.6 Body mass index is 29.8 kg/m.  Assessment:  [name] admitted 9/16 for nausea/vomiting x 2.5 weeks. Patient with h/o recurrent uterine cancer--Bx taken during 9/18 small bowel enteroscopy but no signs of malignant obstruction, negative for malignancy. Oncology recommended resumption of chemotherapy during this admission--carboplatin and paclitaxel completed 9/25.   Patient has severe malnutrition given inability to keep food down with continued N/V during this admission and PTA. Pharmacy consulted for TPN management.  - Carcinomatosis causing delayed gastric motility   Glucose / Insulin: Hx of T2DM -BG goal < 180. Range: 158 - 175 (8 units SSI/24 hrs) Electrolytes: no labs today.  On 10/18 K 4.1, Na 130 - low, Ca 10.6, CorCa 11.56 - slightly elevated Renal: SCr WNL, BUN 48 on 10/18 Hepatic: Alk Phos up to 328 (10/17) , AST WNL, ALT slightly elevated - Albumin low  - TG 216 (10/14) , Tbili WNL Intake / Output; MIVF:  -UOP: 900 ml -NG: 800 ml 10/19 stool x 1 -no mIVF  GI Imaging: - 9/20 DG UGI: Gastric fold thickening as shown on prior CT and endoscopy. Mildly dilated duodenum, with sluggish flow of contrast through the duodenum--possibility of a more distal SBO is not excluded given this appearance. - 9/29 abd Xray: Slight increase in central small bowel dilatation.   GI Surgeries / Procedures:  - 9/18: small bowel endoscopy: Normal appearing, widely patent esophagus and GEJ. Edematous gastric folds-biopsied. A single duodenal polyp in the post-bulbar duodenum-biopsied - 10/3:  placement of gastrostomy tube today to be used for venting -10/12: PEG tube came out. NG tube placed for stomach decompression, suction   Central access: port TPN start date:  9/26  Nutritional  Goals:  Current TPN (1L Clinimix E 8/10) with fat administration daily provides 80 g protein and 1159 kcal daily   RD Assessment: Estimated Needs Total Energy Estimated Needs: 1650-1850 Total Protein Estimated Needs: 85-100g Total Fluid Estimated Needs: 1.8L/day  Current Nutrition:  TPN 9/26 >> NPO  Plan:  Due to the Baxter IV fluid disruption, we will be switching all adult compounded parenteral nutrition to premade Clinimix products +/- fat emulsion infusion. Our goal will be to continue providing as close to 100% of our patient's nutritional needs. Due to the volatile availability of different Clinimix concentrations we may not be able to meet 100% of adult patient nutritional goals at this time.     Clinimix E 8/10 @ 41.6 ml/hr + fat emulsion 20% 250 ml over 12 hours Add MVI to TPN Continue q6h sSSI TPN labs Mon, Thurs. Check additional labs PRN.   Thank you for allowing pharmacy to be a part of this patient's care.  Herby Abraham, Pharm.D Use secure chat for questions 08/17/2023 9:49 AM

## 2023-08-17 NOTE — Progress Notes (Signed)
PROGRESS NOTE    Sherri Gill  NWG:[id number] DOB: 15-Jan-1951 DOA: 07/01/2023 PCP: [name], MD   Brief Narrative:  72 year old female with a stage IV uterine cancer status post TAH and LSO with peritoneal carcinomatosis currently on active chemotherapy as an outpatient, persistent A-fib on Xarelto, diabetes mellitus type 2, hypertension presented with persistent nausea and vomiting with intolerance to oral intake.  Hospital course complicated with refractory ileus due to carcinomatosis requiring TPN.  She had PEG placed on 07/31/2023.  On 08/15/2023, patient started bleeding from PEG tube insertion site along with large amount of bloody vomitus.  She underwent EGD on 08/03/2023 which showed oozing gastric ulcer with active bleeding at the site of PEG tube insertion.  She returned to the ICU intubated, sedated.  On 08/09/2023, PEG tube came out; NG tube placed for stomach decompression and suction.  She was extubated on 08/10/2023.  Palliative care and oncology following.  She was made DNR after palliative care meeting on 08/11/2023.  She was transferred back to [facility] service from 08/13/2023 onwards.  Assessment & Plan:   Acute upper GI bleeding due to gastric ulcer/erosion at PEG tube site -Hemostasis not achieved on EGD on 08/07/2023.  Bleeding stabilized with reversal of Xarelto. -Continue Protonix PPI twice daily. -H&H stable.  No evidence of bleeding. -If patient rebleeds: Will need IR embolization if family continues to want aggressive treatment -Xarelto to remain on hold.  Acute respiratory failure postprocedure, resolved -Extubated on 08/10/2023. -On room air currently.  Refractory ileus Uterine cancer with peritoneal carcinomatosis Goals of care Generalized deconditioning -Palliative care and oncology following.  CODE STATUS has been changed to DNR during this hospitalization by palliative care team. -Not a candidate for further chemotherapy as per oncology and  oncology recommends transitioning to total comfort measures.  Overall prognosis is very poor. -General Surgery has already signed off. -Currently on TPN as per pharmacy.  Continue NPO.  NG tube accidentally got removed on 08/14/2023 but was replaced subsequently. -Had venting gastrostomy tube placed on 07/31/2023 by IR. -Patient is not ready for transitioning to hospice yet.  Overall pleased prognosis is very poor.  Leukocytosis - Monitor intermittently.  Fever/SIRS, resolving -Continue to monitor off antibiotics.  WBC is trending down.  Afebrile.  Cultures negative so far  Anemia of chronic disease -From chronic illnesses.  Hemoglobin currently stable.  Monitor intermittent  Thrombocytopenia -Resolved  Hypokalemia -Improved   Hyponatremia -Sodium improving to 130 on 08/15/2023.  Monitor intermittently.  Hypothyroidism -Oral levothyroxine on hold  Paroxysmal A-fib -Intermittently tachycardic.  Continue IV metoprolol.  Diltiazem on hold.  Xarelto on hold given risk for rebleeding  Anxiety -Off Precedex drip.  Currently on low-dose IV Ativan as needed  DVT prophylaxis: SCDs Code Status: DNR Family Communication: Daughter at bedside Disposition Plan: Status is: Inpatient Remains inpatient appropriate because: Of severity of illness  Consultants: Oncology/palliative care/GI/PCCM/general surgery/IR  Procedures: As above  Antimicrobials:  Anti-infectives (From admission, onward)    Start     Dose/Rate Route Frequency Ordered Stop   08/09/23 1000  vancomycin (VANCOCIN) IVPB 1000 mg/200 mL premix  Status:  Discontinued        1,000 mg 200 mL/hr over 60 Minutes Intravenous Every 12 hours 08/13/2023 2132 08/10/23 0807   08/09/23 0600  ceFEPIme (MAXIPIME) 2 g in sodium chloride 0.9 % 100 mL IVPB  Status:  Discontinued        2 g 200 mL/hr over 30 Minutes Intravenous Every 8 hours 08/16/2023 2132 08/11/23 1132  07/31/2023 2230  vancomycin (VANCOREADY) IVPB 1750 mg/350 mL         1,750 mg 175 mL/hr over 120 Minutes Intravenous  Once 08/20/2023 2122 08/09/23 0117   08/10/2023 2215  vancomycin (VANCOCIN) IVPB 1000 mg/200 mL premix  Status:  Discontinued        1,000 mg 200 mL/hr over 60 Minutes Intravenous  Once 08/01/2023 2117 08/28/2023 2122   08/09/2023 2130  ceFEPIme (MAXIPIME) 2 g in sodium chloride 0.9 % 100 mL IVPB        2 g 200 mL/hr over 30 Minutes Intravenous NOW 08/12/2023 2117 08/07/2023 2309   07/31/23 1000  ceFAZolin (ANCEF) IVPB 2g/100 mL premix        2 g 200 mL/hr over 30 Minutes Intravenous On call 07/31/23 0912 07/31/23 1126   07/27/23 1800  micafungin (MYCAMINE) 100 mg in sodium chloride 0.9 % 100 mL IVPB  Status:  Discontinued        100 mg 105 mL/hr over 1 Hours Intravenous Every 24 hours 07/27/23 1718 08/01/23 1404        Subjective: Patient seen and examined at bedside.  No fever, seizures or agitation reported.  Continues to have intermittent severe abdominal pain with nausea.   Objective: Vitals:   08/17/23 0200 08/17/23 0400 08/17/23 0500 08/17/23 0600  BP: (!) 129/58 128/62 98/71 121/89  Pulse: 95 (!) 107 (!) 110 (!) 117  Resp: (!) 30 (!) 27 (!) 28 (!) 33  Temp:  97.6 F (36.4 C)    TempSrc:  Axillary    SpO2: 98% 98% 97% 99%  Weight:      Height:        Intake/Output Summary (Last 24 hours) at 08/17/2023 0727 Last data filed at 08/17/2023 0655 Gross per 24 hour  Intake 1339.27 ml  Output 1700 ml  Net -360.73 ml   Filed Weights   08/11/23 0500 08/15/23 0500 08/16/23 0500  Weight: 79.2 kg 76.1 kg 76.3 kg    Examination:  General: No acute distress.  On room air.  Chronically ill and deconditioned looking.   ENT/neck: NG tube is still present.  No neck masses or JVD elevation noted respiratory: Bilateral decreased breath sounds at bases with scattered crackles and intermittent tachypnea  CVS: Rate currently controlled; S1 and S2 are heard abdominal: Soft, tender, still distended no organomegaly, sluggish bowel sounds.   Gastrostomy tube in place Extremities: No clubbing; trace lower extremity edema present CNS: Awake and still slow to respond.  No obvious focal deficits noted Lymph: No lymphadenopathy palpable  skin: No obvious petechiae/rashes psych: Currently not agitated.  Flat affect.   Musculoskeletal: No obvious joint erythema/swelling  Data Reviewed: I have personally reviewed following labs and imaging studies  CBC: Recent Labs  Lab 08/11/23 1310 08/12/23 0747 08/13/23 0845 08/14/23 0250 08/15/23 0259  WBC 16.8* 13.9* 11.7* 11.0* 12.2*  HGB 8.3* 8.5* 10.0* 9.7* 9.9*  HCT 26.0* 26.5* 31.8* 29.4* 31.0*  MCV 94.5 94.3 95.2 94.2 94.8  PLT 183 220 293 335 365   Basic Metabolic Panel: Recent Labs  Lab 08/10/23 0927 08/11/23 0651 08/12/23 0747 08/13/23 0845 08/14/23 0250 08/15/23 0259  NA 133* 133* 131* 132* 128* 130*  K 3.5 3.3* 3.8 3.4* 3.6 4.1  CL 107 110 109 105 102 105  CO2 18* 18* 17* 17* 17* 17*  GLUCOSE 142* 169* 172* 169* 165* 169*  BUN 40* 30* 29* 32* 38* 48*  CREATININE 0.81 0.76 0.70 0.79 0.79 0.87  CALCIUM 9.1 9.1 9.6  10.4* 10.2 10.6*  MG 2.2 2.0  --   --  1.7  --   PHOS  --  3.0  --   --  3.1  --    GFR: Estimated Creatinine Clearance: 58.9 mL/min (by C-G formula based on SCr of 0.87 mg/dL). Liver Function Tests: Recent Labs  Lab 08/11/23 0651 08/12/23 0747 08/13/23 0845 08/14/23 0250  AST 11* 14* 34 39  ALT 14 16 44 56*  ALKPHOS 119 155* 249* 328*  BILITOT 0.5 0.6 0.6 0.4  PROT 5.8* 6.6 7.6 7.9  ALBUMIN 2.4* 2.5* 2.9* 2.8*   No results for input(s): "LIPASE", "AMYLASE" in the last 168 hours. No results for input(s): "AMMONIA" in the last 168 hours. Coagulation Profile: No results for input(s): "INR", "PROTIME" in the last 168 hours.  Cardiac Enzymes: No results for input(s): "CKTOTAL", "CKMB", "CKMBINDEX", "TROPONINI" in the last 168 hours. BNP (last 3 results) No results for input(s): "PROBNP" in the last 8760 hours. HbA1C: No results for  input(s): "HGBA1C" in the last 72 hours. CBG: Recent Labs  Lab 08/16/23 0504 08/16/23 1153 08/16/23 1735 08/16/23 2358 08/17/23 0620  GLUCAP 179* 160* 158* 179* 175*   Lipid Profile: No results for input(s): "CHOL", "HDL", "LDLCALC", "TRIG", "CHOLHDL", "LDLDIRECT" in the last 72 hours.  Thyroid Function Tests: No results for input(s): "TSH", "T4TOTAL", "FREET4", "T3FREE", "THYROIDAB" in the last 72 hours. Anemia Panel: No results for input(s): "VITAMINB12", "FOLATE", "FERRITIN", "TIBC", "IRON", "RETICCTPCT" in the last 72 hours. Sepsis Labs: No results for input(s): "PROCALCITON", "LATICACIDVEN" in the last 168 hours.   Recent Results (from the past 240 hour(s))  Culture, blood (x 2)     Status: None   Collection Time: Aug 22, 2023 10:15 PM   Specimen: BLOOD  Result Value Ref Range Status   Specimen Description   Final    BLOOD BLOOD RIGHT HAND Performed at [facility], [address], [place], [place] [address]    Special Requests   Final    BOTTLES DRAWN AEROBIC ONLY Blood Culture results may not be optimal due to an inadequate volume of blood received in culture bottles Performed at [facility], [address], [place], [place] [address]    Culture   Final    NO GROWTH 5 DAYS Performed at [facility], [address] [place], [place] [address]    Report Status 08/14/2023 FINAL  Final  Culture, blood (x 2)     Status: None   Collection Time: Aug 22, 2023 11:27 PM   Specimen: BLOOD RIGHT HAND  Result Value Ref Range Status   Specimen Description   Final    BLOOD RIGHT HAND BOTTLES DRAWN AEROBIC ONLY Performed at [facility], [address] [place], [place] [address]    Special Requests   Final    Blood Culture adequate volume Performed at [facility], [address], [place], [place] [address]    Culture   Final    NO GROWTH 5 DAYS Performed at [facility], [address], [place], [place] [address]    Report Status 08/14/2023 FINAL  Final         Radiology Studies: DG Abd Portable 1V  Result Date: 08/16/2023 CLINICAL DATA:  Nasogastric tube placement. EXAM: PORTABLE ABDOMEN - 1 VIEW COMPARISON:  08/14/2023 FINDINGS: Nasogastric tube is seen with the tip in the distal gastric body. No evidence of dilated bowel loops. IMPRESSION: Nasogastric tube tip in distal gastric body. Electronically  Signed   By: Danae Orleans M.D.   On: 08/16/2023 11:44   [place] EKG SITE RITE  Result Date: 08/15/2023 If Site Rite image not attached, placement could not be confirmed due to current cardiac rhythm.       Scheduled Meds:  Chlorhexidine Gluconate Cloth  6 each Topical Daily   dorzolamide-timolol  1 drop Both Eyes BID   insulin aspart  0-9 Units Subcutaneous Q6H   latanoprost  1 drop Both Eyes QHS   metoprolol tartrate  5 mg Intravenous Q6H   nystatin  5 mL Oral QID   pantoprazole (PROTONIX) IV  40 mg Intravenous BID AC   sodium chloride flush  10 mL Intravenous Q12H   sodium chloride flush  10-40 mL Intracatheter Q12H   Continuous Infusions:  TPN (CLINIMIX-E) Adult 41.6 mL/hr at 08/17/23 0655          Glade Lloyd, MD [facility] 08/17/2023, 7:27 AM

## 2023-08-18 DIAGNOSIS — K567 Ileus, unspecified: Secondary | ICD-10-CM | POA: Diagnosis not present

## 2023-08-18 LAB — CBC WITH DIFFERENTIAL/PLATELET
Abs Immature Granulocytes: 0.49 10*3/uL — ABNORMAL HIGH (ref 0.00–0.07)
Basophils Absolute: 0.1 10*3/uL (ref 0.0–0.1)
Basophils Relative: 0 %
Eosinophils Absolute: 0 10*3/uL (ref 0.0–0.5)
Eosinophils Relative: 0 %
HCT: 33.7 % — ABNORMAL LOW (ref 36.0–46.0)
Hemoglobin: 10.5 g/dL — ABNORMAL LOW (ref 12.0–15.0)
Immature Granulocytes: 3 %
Lymphocytes Relative: 7 %
Lymphs Abs: 1.2 10*3/uL (ref 0.7–4.0)
MCH: 30.3 pg (ref 26.0–34.0)
MCHC: 31.2 g/dL (ref 30.0–36.0)
MCV: 97.1 fL (ref 80.0–100.0)
Monocytes Absolute: 1.6 10*3/uL — ABNORMAL HIGH (ref 0.1–1.0)
Monocytes Relative: 9 %
Neutro Abs: 15.2 10*3/uL — ABNORMAL HIGH (ref 1.7–7.7)
Neutrophils Relative %: 81 %
Platelets: 423 10*3/uL — ABNORMAL HIGH (ref 150–400)
RBC: 3.47 MIL/uL — ABNORMAL LOW (ref 3.87–5.11)
RDW: 15.5 % (ref 11.5–15.5)
WBC: 18.7 10*3/uL — ABNORMAL HIGH (ref 4.0–10.5)
nRBC: 0.2 % (ref 0.0–0.2)

## 2023-08-18 LAB — COMPREHENSIVE METABOLIC PANEL
ALT: 96 U/L — ABNORMAL HIGH (ref 0–44)
AST: 44 U/L — ABNORMAL HIGH (ref 15–41)
Albumin: 3.1 g/dL — ABNORMAL LOW (ref 3.5–5.0)
Alkaline Phosphatase: 589 U/L — ABNORMAL HIGH (ref 38–126)
Anion gap: 10 (ref 5–15)
BUN: 108 mg/dL — ABNORMAL HIGH (ref 8–23)
CO2: 16 mmol/L — ABNORMAL LOW (ref 22–32)
Calcium: 11.7 mg/dL — ABNORMAL HIGH (ref 8.9–10.3)
Chloride: 108 mmol/L (ref 98–111)
Creatinine, Ser: 1.45 mg/dL — ABNORMAL HIGH (ref 0.44–1.00)
GFR, Estimated: 39 mL/min — ABNORMAL LOW (ref >60)
Glucose, Bld: 234 mg/dL — ABNORMAL HIGH (ref 70–99)
Potassium: 4.9 mmol/L (ref 3.5–5.1)
Sodium: 134 mmol/L — ABNORMAL LOW (ref 135–145)
Total Bilirubin: 0.5 mg/dL (ref 0.3–1.2)
Total Protein: 8.6 g/dL — ABNORMAL HIGH (ref 6.5–8.1)

## 2023-08-18 LAB — TRIGLYCERIDES: Triglycerides: 413 mg/dL — ABNORMAL HIGH (ref <150)

## 2023-08-18 LAB — MAGNESIUM: Magnesium: 2.4 mg/dL (ref 1.7–2.4)

## 2023-08-18 LAB — GLUCOSE, CAPILLARY
Glucose-Capillary: 166 mg/dL — ABNORMAL HIGH (ref 70–99)
Glucose-Capillary: 177 mg/dL — ABNORMAL HIGH (ref 70–99)
Glucose-Capillary: 183 mg/dL — ABNORMAL HIGH (ref 70–99)
Glucose-Capillary: 207 mg/dL — ABNORMAL HIGH (ref 70–99)

## 2023-08-18 LAB — PHOSPHORUS: Phosphorus: 5.2 mg/dL — ABNORMAL HIGH (ref 2.5–4.6)

## 2023-08-18 MED ORDER — FAT EMUL FISH OIL/PLANT BASED 20% (SMOFLIPID)IV EMUL: 250.0000 mL | INTRAVENOUS | Status: DC

## 2023-08-18 MED ORDER — TRACE MINERALS CU-MN-SE-ZN 300-55-60-3000 MCG/ML IV SOLN
INTRAVENOUS | Status: DC
  Filled 2023-08-18: qty 1248

## 2023-08-18 MED ORDER — SODIUM CHLORIDE 0.9 % IV SOLN: INTRAVENOUS | Status: AC

## 2023-08-18 MED ORDER — TRACE MINERALS CU-MN-SE-ZN 300-55-60-3000 MCG/ML IV SOLN: INTRAVENOUS | Status: DC

## 2023-08-18 MED ORDER — FAT EMUL FISH OIL/PLANT BASED 20% (SMOFLIPID)IV EMUL
250.0000 mL | INTRAVENOUS | Status: AC
Start: 2023-08-18 — End: 2023-08-19
  Administered 2023-08-18: 250 mL via INTRAVENOUS
  Filled 2023-08-18: qty 250

## 2023-08-18 NOTE — Progress Notes (Signed)
PHARMACY - TOTAL PARENTERAL NUTRITION CONSULT NOTE   Indication: Prolonged ileus  Patient Measurements: Height: 5\' 3"  (160 cm) Weight: 74.6 kg (164 lb 7.4 oz) IBW/kg (Calculated) : 52.4 TPN AdjBW (KG): 58.6 Body mass index is 29.13 kg/m.  Assessment:  [name] admitted 9/16 for nausea/vomiting x 2.5 weeks. Patient with h/o recurrent uterine cancer--Bx taken during 9/18 small bowel enteroscopy but no signs of malignant obstruction, negative for malignancy. Oncology recommended resumption of chemotherapy during this admission--carboplatin and paclitaxel, completed 9/25.   Patient has severe malnutrition given inability to keep food down with continued N/V during this admission and PTA. Pharmacy consulted for TPN management.  - Refractory ileus from carcinomatosis causing delayed gastric motility   Glucose / Insulin: + H/o T2DM -BG goal < 180. Range: 169-207 (11 units SSI/24 hrs) Electrolytes: Na 132 improved, 16 low, CoCa 12.42 rising, Phos 5.2 elevated, Mg 2.4 rising,  Renal: SCr WNL, BUN 48>>108 Hepatic: Alk Phos up to 589, AST 44up, ALT 96 up,  - Albumin 3.1 - TG 216>>_____ , Tbili WNL Intake / Output; MIVF:  -UOP: -NG: 10/19 stool x 1 -no mIVF GIB: Reversal of PTA Xarelto with Kcentra, PPI BID   GI Imaging: - 9/20 DG UGI: Gastric fold thickening as shown on prior CT and endoscopy. Mildly dilated duodenum, with sluggish flow of contrast through the duodenum--possibility of a more distal SBO is not excluded given this appearance. - 9/29 abd Xray: Slight increase in central small bowel dilatation.   GI Surgeries / Procedures:  - 9/18: small bowel endoscopy: Normal appearing, widely patent esophagus and GEJ. Edematous gastric folds-biopsied. A single duodenal polyp in the post-bulbar duodenum-biopsied - 10/3:  placement of gastrostomy tube today to be used for venting - 10/11: EGD: oozing gastric ulcer with active bleeding at the site of PEG tube insertion.   -10/12: PEG tube came out. NG tube placed for stomach decompression, suction   Central access: port TPN start date:  9/26  Nutritional Goals:  Current TPN (1L Clinimix E 8/10) with fat administration daily provides 80 g protein and 1159 kcal daily  TPN (2L bag Clinimix 8/10 with NO LYTES) + fat administration daily will provide 100g protein and 1327 kcal daily    RD Assessment: Estimated Needs Total Energy Estimated Needs: 1650-1850 Total Protein Estimated Needs: 85-100g Total Fluid Estimated Needs: 1.8L/day  Current Nutrition:  TPN 9/26 >> NPO  Plan:  Due to the Baxter IV fluid disruption, we will be switching all adult compounded parenteral nutrition to premade Clinimix products +/- fat emulsion infusion. Our goal will be to continue providing as close to 100% of our patient's nutritional needs. Due to the volatile availability of different Clinimix concentrations we may not be able to meet 100% of adult patient nutritional goals at this time.     Change to Clinimix (NO ELECTROLYTES) 8/10 @ 52 ml/hr + fat emulsion 20% 250 ml over 12 hours Add MVI to TPN Continue q6h sSSI. Add 5 units insulin to the bag TPN labs Mon, Thurs. Check additional labs PRN. F/u triglyceride level --IP   Sherri Gill, PharmD, BCPS Clinical Staff Pharmacist [email or web address] 08/18/2023 7:34 AM

## 2023-08-18 NOTE — Plan of Care (Signed)
  Problem: Coping: Goal: Level of anxiety will decrease Outcome: Progressing   Problem: Elimination: Goal: Will not experience complications related to urinary retention Outcome: Progressing   Problem: Pain Managment: Goal: General experience of comfort will improve Outcome: Progressing   Problem: Safety: Goal: Ability to remain free from injury will improve Outcome: Progressing   Problem: Skin Integrity: Goal: Risk for impaired skin integrity will decrease Outcome: Progressing   Problem: Nutritional: Goal: Maintenance of adequate nutrition will improve Outcome: Progressing   Problem: Respiratory: Goal: Ability to maintain a clear airway and adequate ventilation will improve Outcome: Progressing   Problem: Role Relationship: Goal: Method of communication will improve Outcome: Progressing   Problem: Activity: Goal: Risk for activity intolerance will decrease Outcome: Not Progressing   Problem: Nutrition: Goal: Adequate nutrition will be maintained Outcome: Not Progressing

## 2023-08-18 NOTE — Plan of Care (Signed)
  Problem: Education: Goal: Knowledge of General Education information will improve Description: Including pain rating scale, medication(s)/side effects and non-pharmacologic comfort measures Outcome: Progressing   Problem: Health Behavior/Discharge Planning: Goal: Ability to manage health-related needs will improve Outcome: Progressing   Problem: Clinical Measurements: Goal: Will remain free from infection Outcome: Progressing Goal: Diagnostic test results will improve Outcome: Progressing Goal: Cardiovascular complication will be avoided Outcome: Progressing   Problem: Activity: Goal: Risk for activity intolerance will decrease Outcome: Progressing   Problem: Nutrition: Goal: Adequate nutrition will be maintained Outcome: Progressing   Problem: Elimination: Goal: Will not experience complications related to bowel motility Outcome: Progressing Goal: Will not experience complications related to urinary retention Outcome: Progressing   Problem: Safety: Goal: Ability to remain free from injury will improve Outcome: Progressing   Problem: Skin Integrity: Goal: Risk for impaired skin integrity will decrease Outcome: Progressing   Problem: Education: Goal: Ability to describe self-care measures that may prevent or decrease complications (Diabetes Survival Skills Education) will improve Outcome: Progressing Goal: Individualized Educational Video(s) Outcome: Progressing   Problem: Coping: Goal: Ability to adjust to condition or change in health will improve Outcome: Progressing   Problem: Health Behavior/Discharge Planning: Goal: Ability to identify and utilize available resources and services will improve Outcome: Progressing Goal: Ability to manage health-related needs will improve Outcome: Progressing   Problem: Nutritional: Goal: Maintenance of adequate nutrition will improve Outcome: Progressing Goal: Progress toward achieving an optimal weight will  improve Outcome: Progressing   Problem: Skin Integrity: Goal: Risk for impaired skin integrity will decrease Outcome: Progressing   Problem: Tissue Perfusion: Goal: Adequacy of tissue perfusion will improve Outcome: Progressing   Problem: Activity: Goal: Ability to tolerate increased activity will improve Outcome: Progressing   Problem: Respiratory: Goal: Ability to maintain a clear airway and adequate ventilation will improve Outcome: Progressing   Problem: Role Relationship: Goal: Method of communication will improve Outcome: Progressing   Problem: Fluid Volume: Goal: Hemodynamic stability will improve Outcome: Progressing   Problem: Clinical Measurements: Goal: Diagnostic test results will improve Outcome: Progressing Goal: Signs and symptoms of infection will decrease Outcome: Progressing   Problem: Respiratory: Goal: Ability to maintain adequate ventilation will improve Outcome: Progressing   Problem: Clinical Measurements: Goal: Ability to maintain clinical measurements within normal limits will improve Outcome: Not Progressing   Problem: Coping: Goal: Level of anxiety will decrease Outcome: Not Progressing   Problem: Pain Managment: Goal: General experience of comfort will improve Outcome: Not Progressing   Problem: Fluid Volume: Goal: Ability to maintain a balanced intake and output will improve Outcome: Not Progressing   Problem: Metabolic: Goal: Ability to maintain appropriate glucose levels will improve Outcome: Not Progressing

## 2023-08-18 NOTE — TOC Progression Note (Addendum)
Transition of Care [facility]) - Progression Note    Patient Details  Name: Sherri Gill MRN: [id number] Date of Birth: Dec 23, 1950  [facility]) CM/SW Contact  Lavenia Atlas, RN Phone Number: 08/18/2023, 6:21 PM  Clinical Narrative:  Per chart review palliative is following for GOC, patient not ready for hospice. PT recommended HH services. Patient no longer candidate for chemotherapy.   TOC following for discharge needs.      Expected Discharge Plan: Home/Self Care Barriers to Discharge: Continued Medical Work up  Expected Discharge Plan and Services In-house Referral: NA Discharge Planning Services: CM Consult Post Acute Care Choice: NA Living arrangements for the past 2 months: Apartment                 DME Arranged: N/A                     Social Determinants of Health (SDOH) Interventions SDOH Screenings   Food Insecurity: No Food Insecurity (07/14/2023)  Housing: Low Risk  (07/14/2023)  Transportation Needs: No Transportation Needs (07/14/2023)  Utilities: Not At Risk (07/14/2023)  Alcohol Screen: Low Risk  (02/20/2023)  Depression (PHQ2-9): Medium Risk (03/25/2023)  Financial Resource Strain: Medium Risk (02/20/2023)  Physical Activity: Inactive (02/20/2023)  Social Connections: Unknown (02/20/2023)  Stress: Stress Concern Present (02/20/2023)  Tobacco Use: Medium Risk (07/14/2023)    Readmission Risk Interventions    08/08/2023    6:50 PM 07/16/2023   11:28 AM  Readmission Risk Prevention Plan  Transportation Screening Complete Complete  PCP or Specialist Appt within 3-5 Days  Complete  HRI or Home Care Consult  Complete  Social Work Consult for Recovery Care Planning/Counseling  Complete  Palliative Care Screening  Not Applicable  Medication Review Oceanographer) Complete Referral to Pharmacy  PCP or Specialist appointment within 3-5 days of discharge Complete   HRI or Home Care Consult Complete   SW Recovery Care/Counseling Consult  Complete   Skilled Nursing Facility Not Applicable

## 2023-08-18 NOTE — Progress Notes (Signed)
PROGRESS NOTE    Sherri Gill  ZOX:[id number] DOB: 10-12-1951 DOA: 07/09/2023 PCP: [name], MD   Brief Narrative:  72 year old female with a stage IV uterine cancer status post TAH and LSO with peritoneal carcinomatosis currently on active chemotherapy as an outpatient, persistent A-fib on Xarelto, diabetes mellitus type 2, hypertension presented with persistent nausea and vomiting with intolerance to oral intake.  Hospital course complicated with refractory ileus due to carcinomatosis requiring TPN.  She had PEG placed on 07/31/2023.  On 08/09/2023, patient started bleeding from PEG tube insertion site along with large amount of bloody vomitus.  She underwent EGD on 08/13/2023 which showed oozing gastric ulcer with active bleeding at the site of PEG tube insertion.  She returned to the ICU intubated, sedated.  On 08/09/2023, PEG tube came out; NG tube placed for stomach decompression and suction.  She was extubated on 08/10/2023.  Palliative care and oncology following.  She was made DNR after palliative care meeting on 08/11/2023.  She was transferred back to [facility] service from 08/13/2023 onwards.  Assessment & Plan:   Acute upper GI bleeding due to gastric ulcer/erosion at PEG tube site -Hemostasis not achieved on EGD on 08/03/2023.  Bleeding stabilized with reversal of Xarelto. -Continue Protonix PPI twice daily. -H&H stable.  No evidence of bleeding. -If patient rebleeds: Will need IR embolization if family continues to want aggressive treatment -Xarelto to remain on hold.  Acute respiratory failure postprocedure, resolved -Extubated on 08/10/2023. -On room air currently.  Refractory ileus Uterine cancer with peritoneal carcinomatosis Goals of care Generalized deconditioning -Palliative care and oncology following.  CODE STATUS has been changed to DNR during this hospitalization by palliative care team. -Not a candidate for further chemotherapy as per oncology and  oncology recommends transitioning to total comfort measures.  Overall prognosis is very poor. -General Surgery has already signed off. -Currently on TPN as per pharmacy.  Continue NPO.  NG tube accidentally got removed on 08/14/2023 but was replaced subsequently. -Had venting gastrostomy tube placed on 07/31/2023 by IR. -Patient is not ready for transitioning to hospice yet.  Overall pleased prognosis is very poor.  Leukocytosis -WBCs has worsened to 18.7 today.  Monitor.  Fever/SIRS, resolving -Continue to monitor off antibiotics.  Afebrile.  Cultures negative so far  Anemia of chronic disease -From chronic illnesses.  Hemoglobin currently stable.  Monitor intermittent  Thrombocytosis -Possibly reactive.  Monitor intermittently  Hypokalemia -Improved   Hyponatremia -Sodium improving to 134 today.  Monitor intermittently.  Acute kidney injury -Possibly from dehydration.  Will give gentle hydration.  Repeat a.m. labs.  Acute metabolic acidosis -Bicarb 16 today.  Monitor.  Elevated LFTs -AST and ALT are slightly elevated.  Patient is on TPN.  Monitor.  Hypothyroidism -Oral levothyroxine on hold  Paroxysmal A-fib -Intermittently tachycardic.  Continue IV metoprolol.  Diltiazem on hold.  Xarelto on hold given risk for rebleeding  Anxiety -Off Precedex drip.  Currently on IV Ativan as needed  DVT prophylaxis: SCDs Code Status: DNR Family Communication: Daughter at bedside Disposition Plan: Status is: Inpatient Remains inpatient appropriate because: Of severity of illness  Consultants: Oncology/palliative care/GI/PCCM/general surgery/IR  Procedures: As above  Antimicrobials:  Anti-infectives (From admission, onward)    Start     Dose/Rate Route Frequency Ordered Stop   08/09/23 1000  vancomycin (VANCOCIN) IVPB 1000 mg/200 mL premix  Status:  Discontinued        1,000 mg 200 mL/hr over 60 Minutes Intravenous Every 12 hours 08/18/2023 2132 08/10/23 8119  08/09/23  0600  ceFEPIme (MAXIPIME) 2 g in sodium chloride 0.9 % 100 mL IVPB  Status:  Discontinued        2 g 200 mL/hr over 30 Minutes Intravenous Every 8 hours 08/01/2023 2132 08/11/23 1132   08/02/2023 2230  vancomycin (VANCOREADY) IVPB 1750 mg/350 mL        1,750 mg 175 mL/hr over 120 Minutes Intravenous  Once 08/25/2023 2122 08/09/23 0117   08/10/2023 2215  vancomycin (VANCOCIN) IVPB 1000 mg/200 mL premix  Status:  Discontinued        1,000 mg 200 mL/hr over 60 Minutes Intravenous  Once 08/27/2023 2117 08/05/2023 2122   07/29/2023 2130  ceFEPIme (MAXIPIME) 2 g in sodium chloride 0.9 % 100 mL IVPB        2 g 200 mL/hr over 30 Minutes Intravenous NOW 07/30/2023 2117 08/22/2023 2309   07/31/23 1000  ceFAZolin (ANCEF) IVPB 2g/100 mL premix        2 g 200 mL/hr over 30 Minutes Intravenous On call 07/31/23 0912 07/31/23 1126   07/27/23 1800  micafungin (MYCAMINE) 100 mg in sodium chloride 0.9 % 100 mL IVPB  Status:  Discontinued        100 mg 105 mL/hr over 1 Hours Intravenous Every 24 hours 07/27/23 1718 08/01/23 1404        Subjective: Patient seen and examined at bedside.  Complains of intermittent abdominal pain with nausea.  No chest pain, fever or agitation reported.   Objective: Vitals:   08/18/23 0300 08/18/23 0400 08/18/23 0500 08/18/23 0600  BP: 115/60 115/68 124/61 (!) 146/59  Pulse: 96 96 (!) 102 (!) 103  Resp: (!) 27 16 (!) 26 (!) 28  Temp:      TempSrc:      SpO2: 95% 98% 94% 98%  Weight:   74.6 kg   Height:        Intake/Output Summary (Last 24 hours) at 08/18/2023 0719 Last data filed at 08/18/2023 0644 Gross per 24 hour  Intake 1217.35 ml  Output 1400 ml  Net -182.65 ml   Filed Weights   08/15/23 0500 08/16/23 0500 08/18/23 0500  Weight: 76.1 kg 76.3 kg 74.6 kg    Examination:  General: Currently on room air.  No distress.  Chronically ill and deconditioned looking.   ENT/neck: NG tube is still present.  No obvious JVD elevation or palpable neck masses noted  respiratory:  Decreased breath sounds at bases bilaterally with intermittent tachypnea and some crackles CVS: S1-S2 heard; tachycardic  abdominal: Soft, distended and tender; no organomegaly, bowel sounds are sluggish.  Gastrostomy tube in place Extremities: Mild lower extremity edema; no cyanosis  CNS: Extremely slow to respond; alert.  No obvious focal deficits noted Lymph: No cervical lymphadenopathy noted  skin: No obvious lesions/ecchymosis  psych: Mostly flat affect.  Not agitated currently. Musculoskeletal: No obvious joint tenderness/deformity  Data Reviewed: I have personally reviewed following labs and imaging studies  CBC: Recent Labs  Lab 08/12/23 0747 08/13/23 0845 08/14/23 0250 08/15/23 0259 08/18/23 0510  WBC 13.9* 11.7* 11.0* 12.2* 18.7*  NEUTROABS  --   --   --   --  15.2*  HGB 8.5* 10.0* 9.7* 9.9* 10.5*  HCT 26.5* 31.8* 29.4* 31.0* 33.7*  MCV 94.3 95.2 94.2 94.8 97.1  PLT 220 293 335 365 423*   Basic Metabolic Panel: Recent Labs  Lab 08/12/23 0747 08/13/23 0845 08/14/23 0250 08/15/23 0259 08/18/23 0510  NA 131* 132* 128* 130* 134*  K 3.8 3.4*  3.6 4.1 4.9  CL 109 105 102 105 108  CO2 17* 17* 17* 17* 16*  GLUCOSE 172* 169* 165* 169* 234*  BUN 29* 32* 38* 48* 108*  CREATININE 0.70 0.79 0.79 0.87 1.45*  CALCIUM 9.6 10.4* 10.2 10.6* 11.7*  MG  --   --  1.7  --  2.4  PHOS  --   --  3.1  --  5.2*   GFR: Estimated Creatinine Clearance: 34.9 mL/min (A) (by C-G formula based on SCr of 1.45 mg/dL (H)). Liver Function Tests: Recent Labs  Lab 08/12/23 0747 08/13/23 0845 08/14/23 0250 08/18/23 0510  AST 14* 34 39 44*  ALT 16 44 56* 96*  ALKPHOS 155* 249* 328* 589*  BILITOT 0.6 0.6 0.4 0.5  PROT 6.6 7.6 7.9 8.6*  ALBUMIN 2.5* 2.9* 2.8* 3.1*   No results for input(s): "LIPASE", "AMYLASE" in the last 168 hours. No results for input(s): "AMMONIA" in the last 168 hours. Coagulation Profile: No results for input(s): "INR", "PROTIME" in the last 168 hours.  Cardiac  Enzymes: No results for input(s): "CKTOTAL", "CKMB", "CKMBINDEX", "TROPONINI" in the last 168 hours. BNP (last 3 results) No results for input(s): "PROBNP" in the last 8760 hours. HbA1C: No results for input(s): "HGBA1C" in the last 72 hours. CBG: Recent Labs  Lab 08/17/23 0620 08/17/23 1229 08/17/23 1742 08/17/23 2359 08/18/23 0610  GLUCAP 175* 174* 169* 183* 207*   Lipid Profile: No results for input(s): "CHOL", "HDL", "LDLCALC", "TRIG", "CHOLHDL", "LDLDIRECT" in the last 72 hours.  Thyroid Function Tests: No results for input(s): "TSH", "T4TOTAL", "FREET4", "T3FREE", "THYROIDAB" in the last 72 hours. Anemia Panel: No results for input(s): "VITAMINB12", "FOLATE", "FERRITIN", "TIBC", "IRON", "RETICCTPCT" in the last 72 hours. Sepsis Labs: No results for input(s): "PROCALCITON", "LATICACIDVEN" in the last 168 hours.   Recent Results (from the past 240 hour(s))  Culture, blood (x 2)     Status: None   Collection Time: 08/21/2023 10:15 PM   Specimen: BLOOD  Result Value Ref Range Status   Specimen Description   Final    BLOOD BLOOD RIGHT HAND Performed at [facility], [address] [place], [place] [address]    Special Requests   Final    BOTTLES DRAWN AEROBIC ONLY Blood Culture results may not be optimal due to an inadequate volume of blood received in culture bottles Performed at [facility], [address] [place], [place] [address]    Culture   Final    NO GROWTH 5 DAYS Performed at [facility], [address], [place], [place] [address]    Report Status 08/14/2023 FINAL  Final  Culture, blood (x 2)     Status: None   Collection Time: 08/22/2023 11:27 PM   Specimen: BLOOD RIGHT HAND  Result Value Ref Range Status   Specimen Description   Final    BLOOD RIGHT HAND BOTTLES DRAWN AEROBIC ONLY Performed at [facility], [address] [place], [place] [address]    Special Requests   Final    Blood  Culture adequate volume Performed at [facility], [address] [place], [place] [address]    Culture   Final    NO GROWTH 5 DAYS Performed at [facility], [address], [place], [place] [address]    Report Status 08/14/2023 FINAL  Final         Radiology Studies: DG Abd Portable 1V  Result Date: 08/16/2023 CLINICAL DATA:  Nasogastric tube placement. EXAM: PORTABLE ABDOMEN - 1 VIEW COMPARISON:  08/14/2023 FINDINGS: Nasogastric tube is seen with the tip in the distal gastric body. No evidence of dilated bowel loops. IMPRESSION: Nasogastric tube tip in distal gastric body. Electronically Signed   By: Danae Orleans M.D.   On: 08/16/2023 11:44        Scheduled Meds:  Chlorhexidine Gluconate Cloth  6 each Topical Daily   dorzolamide-timolol  1 drop Both Eyes BID   insulin aspart  0-9 Units Subcutaneous Q6H   latanoprost  1 drop Both Eyes QHS   metoprolol tartrate  5 mg Intravenous Q6H   nystatin  5 mL Oral QID   pantoprazole (PROTONIX) IV  40 mg Intravenous BID AC   sodium chloride flush  10 mL Intravenous Q12H   sodium chloride flush  10-40 mL Intracatheter Q12H   Continuous Infusions:  TPN (CLINIMIX-E) Adult 41.6 mL/hr at 08/18/23 0600          Glade Lloyd, MD [facility] 08/18/2023, 7:19 AM

## 2023-08-18 NOTE — Progress Notes (Signed)
Palliative Medicine Progress Note   Patient Name: Sherri Gill       Date: 08/18/2023 DOB: Mar 02, 1951  Age: 72 y.o. MRN#: [id number] Attending Physician: Glade Lloyd, MD Primary Care Physician: [name], MD Admit Date: 07/12/2023  Reason for Consultation/Follow-up: {Reason for Consult:23484}  HPI/Patient Profile: 72 y.o. female with past medical history of metastatic uterine cancer with peritoneal carcinomatosis on active chemotherapy/immunotherapy, paroxysmal atrial fibrillation on Xarelto, DM, and HTN who was admitted on 07/20/2023 with intractable nausea and vomiting. She was started on TPN due to refractory ileus. Palliative Medicine was consulted for goals of care.    On 10/11, patient had bleeding from PEG tube insertion site and hematemesis. She underwent urgent EGD and remained intubated after the procedure. She was extubated on 10/13.  Subjective: ***  Objective:  Physical Exam          Vital Signs: BP 133/67   Pulse (!) 102   Temp 97.8 F (36.6 C) (Oral)   Resp (!) 22   Ht 5\' 3"  (1.6 m)   Wt 74.6 kg   SpO2 99%   BMI 29.13 kg/m  SpO2: SpO2: 99 % O2 Device: O2 Device: Room Air O2 Flow Rate: O2 Flow Rate (L/min): 2 L/min  Intake/output summary:  Intake/Output Summary (Last 24 hours) at 08/18/2023 2133 Last data filed at 08/18/2023 2440 Gross per 24 hour  Intake 2115.07 ml  Output 1625 ml  Net 490.07 ml    LBM: Last BM Date : 08/16/23     Palliative Assessment/Data: ***     Palliative Medicine Assessment & Plan   Assessment: Principal Problem:   Refractory ileus due to carcinomatosis Active Problems:   Essential hypertension   Type 2 diabetes mellitus with neurological complications (HCC)   Uterine cancer (HCC)   Hypokalemia   PAF  (paroxysmal atrial fibrillation) (HCC) onset post-op Spring 2023   Malnutrition of moderate degree   Thrush   Hypothyroidism   Pancytopenia (HCC)   Obesity (BMI 30-39.9)    Recommendations/Plan: ***  Goals of Care and Additional Recommendations: Limitations on Scope of Treatment: {Recommended Scope and Preferences:21019}  Code Status:   Prognosis:  {Palliative Care Prognosis:23504}  Discharge Planning: {Palliative dispostion:23505}  Care plan was discussed with ***  Thank you for allowing the Palliative Medicine Team to assist in the care of  this patient.   ***   Merry Proud, NP   Please contact Palliative Medicine Team phone at [phone] for questions and concerns.  For individual providers, please see AMION.

## 2023-08-19 DIAGNOSIS — G893 Neoplasm related pain (acute) (chronic): Secondary | ICD-10-CM

## 2023-08-19 DIAGNOSIS — C55 Malignant neoplasm of uterus, part unspecified: Secondary | ICD-10-CM | POA: Diagnosis not present

## 2023-08-19 DIAGNOSIS — K567 Ileus, unspecified: Secondary | ICD-10-CM | POA: Diagnosis not present

## 2023-08-19 DIAGNOSIS — R112 Nausea with vomiting, unspecified: Secondary | ICD-10-CM | POA: Diagnosis not present

## 2023-08-19 LAB — BASIC METABOLIC PANEL
Anion gap: 6 (ref 5–15)
BUN: 112 mg/dL — ABNORMAL HIGH (ref 8–23)
CO2: 16 mmol/L — ABNORMAL LOW (ref 22–32)
Calcium: 10.6 mg/dL — ABNORMAL HIGH (ref 8.9–10.3)
Chloride: 116 mmol/L — ABNORMAL HIGH (ref 98–111)
Creatinine, Ser: 1.22 mg/dL — ABNORMAL HIGH (ref 0.44–1.00)
GFR, Estimated: 48 mL/min — ABNORMAL LOW (ref >60)
Glucose, Bld: 201 mg/dL — ABNORMAL HIGH (ref 70–99)
Potassium: 3.9 mmol/L (ref 3.5–5.1)
Sodium: 138 mmol/L (ref 135–145)

## 2023-08-19 LAB — PHOSPHORUS: Phosphorus: 3.7 mg/dL (ref 2.5–4.6)

## 2023-08-19 LAB — GLUCOSE, CAPILLARY
Glucose-Capillary: 140 mg/dL — ABNORMAL HIGH (ref 70–99)
Glucose-Capillary: 165 mg/dL — ABNORMAL HIGH (ref 70–99)
Glucose-Capillary: 169 mg/dL — ABNORMAL HIGH (ref 70–99)
Glucose-Capillary: 182 mg/dL — ABNORMAL HIGH (ref 70–99)
Glucose-Capillary: 193 mg/dL — ABNORMAL HIGH (ref 70–99)

## 2023-08-19 LAB — MAGNESIUM: Magnesium: 2.2 mg/dL (ref 1.7–2.4)

## 2023-08-19 MED ORDER — DEXTROSE-SODIUM CHLORIDE 5-0.9 % IV SOLN: INTRAVENOUS | Status: DC

## 2023-08-19 MED ORDER — FLUTICASONE PROPIONATE 50 MCG/ACT NA SUSP
1.0000 | Freq: Every day | NASAL | Status: DC
  Administered 2023-08-19 – 2023-08-22 (×4): 1 via NASAL
  Filled 2023-08-19: qty 16

## 2023-08-19 MED ORDER — INSULIN ASPART 100 UNIT/ML IJ SOLN
0.0000 [IU] | Freq: Four times a day (QID) | INTRAMUSCULAR | Status: DC
  Administered 2023-08-19: 3 [IU] via SUBCUTANEOUS
  Administered 2023-08-19: 2 [IU] via SUBCUTANEOUS
  Administered 2023-08-20 – 2023-08-23 (×16): 3 [IU] via SUBCUTANEOUS
  Administered 2023-08-24: 5 [IU] via SUBCUTANEOUS
  Administered 2023-08-24: 3 [IU] via SUBCUTANEOUS

## 2023-08-19 MED ORDER — INFUVITE ADULT IV SOLN
INTRAVENOUS | Status: AC
  Filled 2023-08-19: qty 1248

## 2023-08-19 NOTE — Plan of Care (Signed)
  Problem: Health Behavior/Discharge Planning: Goal: Ability to manage health-related needs will improve Outcome: Not Progressing   Problem: Activity: Goal: Risk for activity intolerance will decrease Outcome: Not Progressing   Problem: Nutrition: Goal: Adequate nutrition will be maintained Outcome: Not Progressing   Problem: Elimination: Goal: Will not experience complications related to bowel motility Outcome: Not Progressing

## 2023-08-19 NOTE — Progress Notes (Signed)
Nutrition Follow-up  DOCUMENTATION CODES:   Non-severe (moderate) malnutrition in context of chronic illness  INTERVENTION:  - TPN management per pharmacy - TPN: Clinimix (NO electrolytes) 8/10 @ 52 ml/hr (at max protein) + D5 @50mL /hr x24 hours to provide 1072 total kcal (65% of estimated needs) and 100g protein (100% estimated needs) over the next 24 hrs. - Holding lipids due to elevated triglycerides   - Daily weights while on TPN. - Will monitor for ongoing GOC discussions.  NUTRITION DIAGNOSIS:   Moderate Malnutrition related to chronic illness, cancer and cancer related treatments as evidenced by mild muscle depletion, energy intake < or equal to 75% for > or equal to 1 month. *ongoing  GOAL:   Patient will meet greater than or equal to 90% of their needs *on TPN  MONITOR:   PO intake, Supplement acceptance, Labs, Weight trends, I & O's (TPN)  REASON FOR ASSESSMENT:   Consult Calorie Count  ASSESSMENT:   72 y.o. 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  9/16: admitted, NPO, NGT placed 9/18: NGT removed, CLD ->Regular diet ->CLD 9/19: Soft diet ->FLD 9/23: Soft diet 9/24: NPO, NGT replaced 9/26: clamping trials 9/29: clamping trails -> failed, NGT back to suction 9/30: clamping trails resumed 10/2: vomited; NGT back to suction 10/3: venting G-tube placed 10/4: NGT removed 10/11: EGD, remains on vent 10/12: PEG removed, NGT placed 10/13: extubated 10/17: NGT removed, new one placed   Patient to remain NPO. NGT remains in place, to LIS. Palliative care having ongoing GOC meetings. She remains on TPN however lipids had to be removed today due to elevated  triglycerides.  TPN tonight: Clinimix (no electrolytes) 8/10 at 38mL/hr + D5 at 50mL/hr x24 hours to provide 100g protein (100% of protein needs) and 1072 kcals (65% of kcal needs). Due to ongoing Baxter IV fluid disruption patient will need to continue to receive premade Clinimix products which will likely continue to not be able to meet 100% of patient's needs.    Admit weight: 178# Current weight: 164# I&O's: + 2.9L  Medications reviewed and include: Insulin, D5 @ 55mL/hr x24 hours (will provide 204 kcals)  Labs reviewed:  Creatinine 1.22 HA1C 6.4 Blood Glucose 165-182 x24 hours Triglycerides 413 (as of 10/21)    Diet Order:   Diet Order             Diet NPO time specified Except for: Ice Chips  Diet effective now                   EDUCATION NEEDS:  No education needs have been identified at this time  Skin:  Skin Assessment: Reviewed RN Assessment  Last BM:  10/9  Height:  Ht Readings from Last 1 Encounters:  08/09/23 5\' 3"  (1.6 m)   Weight:  Wt Readings from Last 1 Encounters:  08/19/23 74.6 kg    BMI:  Body mass index is 29.13 kg/m.  Estimated Nutritional Needs:  Kcal:  1650-1850 Protein:  85-100g Fluid:  1.8L/day    Shelle Iron RD, LDN For contact information, refer to [facility].

## 2023-08-19 NOTE — Progress Notes (Signed)
Physical Therapy Treatment Patient Details Name: Sherri Gill MRN: [id number] DOB: [birth year] Today's Date: 08/19/2023   History of Present Illness 72 year old woman with a history of recurrent stage IV uterine cancer with peritoneal carcinomatosis on chemotherapy per Dr. [name], atrial fibrillation on Xarelto, diabetes, hypertension.  She is admitted with refractory ileus due to her intra-abdominal tumor burden.  She has been on TPN, had a PEG placed on 10/3.  On 10/11 she started bleeding for the PEG tube insertion site and had hematemesis.She went urgently for EGD 10/11> oozing gastric ulcer with active bleeding at the site of PEG tube insertion, unable to achieve hemostasis with clips and Hemospray.  Return to the ICU intubated, extubated 10/13.    PT Comments  The patient reporting abdominal pain but willing to mobilize, Assisted to sitting on bed edge and stood x 2 with small side steps supported by 2 persons. Patient is tolerating minimal mobility. Per report, does transfer to [facility].  Continue efforts for mobility.   If plan is discharge home, recommend the following: Assistance with cooking/housework;Assist for transportation;Help with stairs or ramp for entrance;A lot of help with bathing/dressing/bathroom;Two people to help with walking and/or transfers   Can travel by private vehicle        Equipment Recommendations  Rolling walker (2 wheels);Wheelchair (measurements PT);Wheelchair cushion (measurements PT)    Recommendations for Other Services       Precautions / Restrictions Precautions Precautions: Fall Precaution Comments: NG suction,     Mobility  Bed Mobility   Bed Mobility: Supine to Sit, Sit to Supine     Supine to sit: HOB elevated, Max assist Sit to supine: Max assist   General bed mobility comments: assist with trunk to pull to sit, assist legs onto bed    Transfers Overall transfer level: Needs assistance Equipment used: 2 person hand held  assist Transfers: Sit to/from Stand Sit to Stand: +2 safety/equipment, +2 physical assistance, Mod assist           General transfer comment: patient stood with assistance x 2 from bed, small side seps along the bed x 3, shuffling    Ambulation/Gait                   Stairs             Wheelchair Mobility     Tilt Bed    Modified Rankin (Stroke Patients Only)       Balance Overall balance assessment: Needs assistance Sitting-balance support: Bilateral upper extremity supported, Feet supported Sitting balance-Leahy Scale: Fair Sitting balance - Comments: keeping eyes closed mostly, supported with UE's   Standing balance support: Bilateral upper extremity supported, During functional activity Standing balance-Leahy Scale: Poor Standing balance comment: relaint on support.                            Cognition Arousal: Alert Behavior During Therapy: Flat affect Overall Cognitive Status: Difficult to assess                                 General Comments: required frequent stimulation to follow and stay on mobility task        Exercises      General Comments        Pertinent Vitals/Pain Pain Assessment Faces Pain Scale: Hurts even more Pain Location: abdomen Pain Descriptors / Indicators: Discomfort Pain Intervention(s): Monitored  during session, Limited activity within patient's tolerance    Home Living                          Prior Function            PT Goals (current goals can now be found in the care plan section) Progress towards PT goals: Not progressing toward goals - comment (remains very weak and has pain limiting, now not ambulatory)    Frequency    Min 1X/week      PT Plan      Co-evaluation PT/OT/SLP Co-Evaluation/Treatment: Yes Reason for Co-Treatment: Complexity of the patient's impairments (multi-system involvement);To address functional/ADL transfers PT goals addressed  during session: Mobility/safety with mobility OT goals addressed during session: ADL's and self-care      AM-PAC PT "6 Clicks" Mobility   Outcome Measure  Help needed turning from your back to your side while in a flat bed without using bedrails?: A Lot Help needed moving from lying on your back to sitting on the side of a flat bed without using bedrails?: A Lot Help needed moving to and from a bed to a chair (including a wheelchair)?: Total Help needed standing up from a chair using your arms (e.g., wheelchair or bedside chair)?: Total Help needed to walk in hospital room?: Total Help needed climbing 3-5 steps with a railing? : Total 6 Click Score: 8    End of Session Equipment Utilized During Treatment: Oxygen Activity Tolerance: Patient limited by fatigue Patient left: in bed;with call bell/phone within reach;with nursing/sitter in room Nurse Communication: Mobility status PT Visit Diagnosis: Unsteadiness on feet (R26.81);Difficulty in walking, not elsewhere classified (R26.2)     Time: 0093-8182 PT Time Calculation (min) (ACUTE ONLY): 22 min  Charges:      PT General Charges $$ ACUTE PT VISIT: 1 Visit                     [name] PT Acute Rehabilitation Services Office [phone] Weekend pager-[phone]    [name] 08/19/2023, 12:58 PM

## 2023-08-19 NOTE — Progress Notes (Signed)
Occupational Therapy Treatment Patient Details Name: Sherri Gill MRN: [id number] DOB: 1950/11/12 Today's Date: 08/19/2023   History of present illness 72 year old woman with a history of recurrent stage IV uterine cancer with peritoneal carcinomatosis on chemotherapy per Dr. [name], atrial fibrillation on Xarelto, diabetes, hypertension.  She is admitted with refractory ileus due to her intra-abdominal tumor burden.  She has been on TPN, had a PEG placed on 10/3.  On 10/11 she started bleeding for the PEG tube insertion site and had hematemesis.She went urgently for EGD 10/11> oozing gastric ulcer with active bleeding at the site of PEG tube insertion, unable to achieve hemostasis with clips and Hemospray.  Return to the ICU intubated, extubated 10/13.   OT comments  Patient was noted to have limited participation in session. Patient +2 for standing at EOB to change linens on bed. Patient continues to have decreased functional activity tolerance, decreased strength, decreased standing balance and decreased sitting balance. Patient's discharge plan remains appropriate at this time.  OT will continue to follow acutely.        If plan is discharge home, recommend the following:  A lot of help with walking and/or transfers;Assistance with cooking/housework;A lot of help with bathing/dressing/bathroom;Help with stairs or ramp for entrance         Precautions / Restrictions Precautions Precautions: Fall Precaution Comments: NG suction, Restrictions Weight Bearing Restrictions: No       Mobility Bed Mobility Overal bed mobility: Needs Assistance Bed Mobility: Supine to Sit, Sit to Supine     Supine to sit: HOB elevated, Max assist Sit to supine: Max assist, +2 for safety/equipment   General bed mobility comments: assist with trunk to pull to sit, assist legs onto bed        Balance Overall balance assessment: Needs assistance Sitting-balance support: Bilateral upper extremity  supported, Feet supported Sitting balance-Leahy Scale: Fair Sitting balance - Comments: keeping eyes closed mostly, supported with UE's   Standing balance support: Bilateral upper extremity supported, During functional activity Standing balance-Leahy Scale: Poor Standing balance comment: relaint on support.       ADL either performed or assessed with clinical judgement   ADL Overall ADL's : Needs assistance/impaired       Toilet Transfer: +2 for safety/equipment;+2 for physical assistance;Maximal assistance Toilet Transfer Details (indicate cue type and reason): patient was able to stand at EOB with increased time. with HHA. patient unable to progress feet to take steps on side of bed with wide base of support. Toileting- Clothing Manipulation and Hygiene: Bed level;Total assistance Toileting - Clothing Manipulation Details (indicate cue type and reason): patient was TD for hygiene at bed level.              Cognition Arousal: Alert Behavior During Therapy: Flat affect Overall Cognitive Status: Difficult to assess               General Comments: required frequent stimulation to follow and stay on mobility task                   Pertinent Vitals/ Pain       Pain Assessment Pain Assessment: Faces Faces Pain Scale: Hurts even more Pain Location: abdomen Pain Descriptors / Indicators: Discomfort, Grimacing Pain Intervention(s): Premedicated before session, Monitored during session         Frequency  Min 1X/week        Progress Toward Goals  OT Goals(current goals can now be found in the care plan section)  Progress  towards OT goals: Not progressing toward goals - comment (limited progress during session,)     Plan      Co-evaluation      Reason for Co-Treatment: Complexity of the patient's impairments (multi-system involvement);To address functional/ADL transfers PT goals addressed during session: Mobility/safety with mobility OT goals  addressed during session: ADL's and self-care      AM-PAC OT "6 Clicks" Daily Activity     Outcome Measure   Help from another person eating meals?: Total (NPO) Help from another person taking care of personal grooming?: A Little Help from another person toileting, which includes using toliet, bedpan, or urinal?: Total Help from another person bathing (including washing, rinsing, drying)?: A Lot Help from another person to put on and taking off regular upper body clothing?: A Lot Help from another person to put on and taking off regular lower body clothing?: A Lot 6 Click Score: 11    End of Session Equipment Utilized During Treatment: Oxygen  OT Visit Diagnosis: Muscle weakness (generalized) (M62.81)   Activity Tolerance Patient limited by fatigue   Patient Left in bed;with call bell/phone within reach;with bed alarm set;Other (comment) (with nurse in room)   Nurse Communication Mobility status        Time: [phone] OT Time Calculation (min): 22 min  Charges: OT General Charges $OT Visit: 1 Visit OT Treatments $Self Care/Home Management : 8-22 mins  Rosalio Loud, MS Acute Rehabilitation Department Office# [phone]   Selinda Flavin 08/19/2023, 1:26 PM

## 2023-08-19 NOTE — Progress Notes (Signed)
Palliative Medicine Progress Note   Patient Name: Sherri Gill       Date: 08/19/2023 DOB: 09-Feb-1951  Age: 72 y.o. MRN#: [id number] Attending Physician: Glade Lloyd, MD Primary Care Physician: [name], MD Admit Date: 07/13/2023  Reason for Consultation/Follow-up: {Reason for Consult:23484}  HPI/Patient Profile: 72 y.o. female with past medical history of metastatic uterine cancer with peritoneal carcinomatosis on active chemotherapy/immunotherapy, paroxysmal atrial fibrillation on Xarelto, DM, and HTN who was admitted on 07/28/2023 with intractable nausea and vomiting. She was started on TPN due to refractory ileus. Palliative Medicine was consulted for goals of care.    On 2023/09/02, patient had bleeding from PEG tube insertion site and hematemesis. She underwent urgent EGD and remained intubated after the procedure. She was extubated on 10/13.  Subjective: Chart reviewed, update received from RN, and patient assessed at bedside. Per MAR, she has received 250 mcg IV fentanyl in the past 24 hours.  I met in the ICU conference room with daughter Sherri Gill. Another family member/Jennifer joins [place] by phone.   Patent's current clinical status was reviewed.   Family understands that patient's prognosis is poor and this is ultimately a terminal situation. However, they report that patient is still not in a place of accepting this. They feel quite strongly that medical providers stop pushing patient toward comfort care and hospice, as she is not ready.   Objective:  Physical Exam          Vital Signs: BP 137/62   Pulse 82   Temp 98.9 F (37.2 C) (Axillary)   Resp 18   Ht 5\' 3"  (1.6 m)   Wt 74.6 kg   SpO2 100%   BMI 29.13 kg/m  SpO2: SpO2: 100 % O2 Device: O2 Device: Room  Air O2 Flow Rate: O2 Flow Rate (L/min): 2 L/min  Intake/output summary:  Intake/Output Summary (Last 24 hours) at 08/19/2023 2123 Last data filed at 08/19/2023 2841 Gross per 24 hour  Intake 2760.29 ml  Output 850 ml  Net 1910.29 ml    LBM: Last BM Date : 08/16/23     Palliative Assessment/Data: ***     Palliative Medicine Assessment & Plan   Assessment: Principal Problem:   Refractory ileus due to carcinomatosis Active Problems:   Essential hypertension   Type 2 diabetes mellitus with neurological complications (  HCC)   Uterine cancer (HCC)   Hypokalemia   PAF (paroxysmal atrial fibrillation) (HCC) onset post-op Spring 2023   Malnutrition of moderate degree   Thrush   Hypothyroidism   Pancytopenia (HCC)   Obesity (BMI 30-39.9)    Recommendations/Plan: Patient is not ready for hospice at this time - family requests that medical providers stop asking about this Daughter understands that prognosis is poor and has a low threshold to transition to comfort when patient's condition further declines PMT will continue to follow   Code Status: DNR - interventions   Prognosis:  {Palliative Care Prognosis:23504}  Discharge Planning: {Palliative dispostion:23505}  Care plan was discussed with ***  Thank you for allowing the Palliative Medicine Team to assist in the care of this patient.   ***   Merry Proud, NP   Please contact Palliative Medicine Team phone at [phone] for questions and concerns.  For individual providers, please see [facility].

## 2023-08-19 NOTE — Progress Notes (Signed)
PHARMACY - TOTAL PARENTERAL NUTRITION CONSULT NOTE   Indication: Prolonged ileus  Patient Measurements: Height: 5\' 3"  (160 cm) Weight: 74.6 kg (164 lb 7.4 oz) IBW/kg (Calculated) : 52.4 TPN AdjBW (KG): 58.6 Body mass index is 29.13 kg/m.  Assessment:  [name] admitted 9/16 for nausea/vomiting x 2.5 weeks. Patient with h/o recurrent uterine cancer--Bx taken during 9/18 small bowel enteroscopy but no signs of malignant obstruction, negative for malignancy. Oncology recommended resumption of chemotherapy during this admission--carboplatin and paclitaxel, completed 9/25.   Patient has severe malnutrition given inability to keep food down with continued N/V during this admission and PTA. Pharmacy consulted for TPN management.  - Refractory ileus from carcinomatosis causing delayed gastric motility   Glucose / Insulin: + H/o T2DM -BG goal < 180. Range: 165-207 (11 units SSI/24 hrs)  Electrolytes: Na 138 improved, 16 remains low, CoCa 12.42>>11.32 down, Phos 5.2>>3.7 down, Mg 2.4>>2.2 down (removed lytes from TPN 10/21 PM bag)  Renal: SCr WNL, BUN up to 112  Hepatic: Alk Phos up to 589, AST 44 up, ALT 96 up,  Tbili WNL - Albumin 3.1 - TG 216>> up to 413   Intake / Output; MIVF: (+2937) - UOP: 950 - NG OP: 1150 (up), 1 unmeasured incident - LBM: 10/19 stool x 1 - MIVF: NS @50ml /hr=1230ml/d + TPN fluid daily  GIB: due to gastric ulcer/erosion at PEG tube site Reversal of PTA Xarelto with Kcentra, on PPI BID   GI Imaging: - 9/20 DG UGI: Gastric fold thickening as shown on prior CT and endoscopy. Mildly dilated duodenum, with sluggish flow of contrast through the duodenum--possibility of a more distal SBO is not excluded given this appearance. - 9/29 abd Xray: Slight increase in central small bowel dilatation.   GI Surgeries / Procedures:  - 9/18: small bowel endoscopy: Normal appearing, widely patent esophagus and GEJ. Edematous gastric folds-biopsied. A single duodenal  polyp in the post-bulbar duodenum-biopsied - 10/3:  placement of gastrostomy tube today to be used for venting - 10/11: EGD: oozing gastric ulcer with active bleeding at the site of PEG tube insertion.  -10/12: PEG tube came out. NG tube placed for stomach decompression, suction   Central access: port TPN start date:  9/26  Nutritional Goals:  TPN (1L Clinimix E 8/10) with fat administration daily provides 80 g protein and 1159 kcal daily  TPN (2L bag Clinimix 8/10 with NO LYTES) + fat administration daily will provide 100g protein and 1327 kcal daily   TPN (2L bag Clinimix 8/10 with NO LYTES) and NO LIPIDS will provide 100g protein and 832 kcal.   RD Assessment: Estimated Needs Total Energy Estimated Needs: 1650-1850 Total Protein Estimated Needs: 85-100g Total Fluid Estimated Needs: 1.8L/day  Current Nutrition:  TPN 9/26 >> NPO  Plan:  Due to the Baxter IV fluid disruption, we will be switching all adult compounded parenteral nutrition to premade Clinimix products +/- fat emulsion infusion. Our goal will be to continue providing as close to 100% of our patient's nutritional needs. Due to the volatile availability of different Clinimix concentrations we may not be able to meet 100% of adult patient nutritional goals at this time.     Clinimix (NO ELECTROLYTES) 8/10 @ 52 ml/hr (at max protein) + change in IVF for tonight to D5NS on 10/22 will provide  100g protein and 1072 total kcal over the next 24 hrs. HOLD lipid administration with significantly elevated triglycerides Add MVI to TPN Continue q6h SSI. Change to moderate scale (10/22)  Change IVF to D5NS at 42ml/hr (will provide extra 240kcal). tonight at 1800. (Changing to D5W or D10W at a higher rate could drive up triglycerides further). Will have to enter new order daily with IVF shortage.  TPN labs Mon, Thurs. Check additional labs PRN. No further chemo. Patient not ready for hospice. F/u to determine long-term  disposition.   Estera Ozier S. Merilynn Finland, PharmD, BCPS Clinical Staff Pharmacist [email or web address] 08/19/2023 7:15 AM

## 2023-08-19 NOTE — Plan of Care (Signed)
  Problem: Education: Goal: Knowledge of General Education information will improve Description: Including pain rating scale, medication(s)/side effects and non-pharmacologic comfort measures Outcome: Progressing   Problem: Health Behavior/Discharge Planning: Goal: Ability to manage health-related needs will improve Outcome: Progressing   Problem: Clinical Measurements: Goal: Will remain free from infection Outcome: Progressing Goal: Diagnostic test results will improve Outcome: Progressing Goal: Cardiovascular complication will be avoided Outcome: Progressing   Problem: Activity: Goal: Risk for activity intolerance will decrease Outcome: Progressing   Problem: Nutrition: Goal: Adequate nutrition will be maintained Outcome: Progressing   Problem: Elimination: Goal: Will not experience complications related to bowel motility Outcome: Progressing Goal: Will not experience complications related to urinary retention Outcome: Progressing   Problem: Safety: Goal: Ability to remain free from injury will improve Outcome: Progressing   Problem: Education: Goal: Ability to describe self-care measures that may prevent or decrease complications (Diabetes Survival Skills Education) will improve Outcome: Progressing Goal: Individualized Educational Video(s) Outcome: Progressing   Problem: Coping: Goal: Ability to adjust to condition or change in health will improve Outcome: Progressing   Problem: Fluid Volume: Goal: Ability to maintain a balanced intake and output will improve Outcome: Progressing   Problem: Health Behavior/Discharge Planning: Goal: Ability to identify and utilize available resources and services will improve Outcome: Progressing Goal: Ability to manage health-related needs will improve Outcome: Progressing   Problem: Nutritional: Goal: Maintenance of adequate nutrition will improve Outcome: Progressing Goal: Progress toward achieving an optimal weight  will improve Outcome: Progressing   Problem: Tissue Perfusion: Goal: Adequacy of tissue perfusion will improve Outcome: Progressing   Problem: Activity: Goal: Ability to tolerate increased activity will improve Outcome: Progressing   Problem: Respiratory: Goal: Ability to maintain a clear airway and adequate ventilation will improve Outcome: Progressing   Problem: Role Relationship: Goal: Method of communication will improve Outcome: Progressing   Problem: Fluid Volume: Goal: Hemodynamic stability will improve Outcome: Progressing   Problem: Clinical Measurements: Goal: Diagnostic test results will improve Outcome: Progressing Goal: Signs and symptoms of infection will decrease Outcome: Progressing   Problem: Respiratory: Goal: Ability to maintain adequate ventilation will improve Outcome: Progressing   Problem: Clinical Measurements: Goal: Ability to maintain clinical measurements within normal limits will improve Outcome: Not Progressing   Problem: Coping: Goal: Level of anxiety will decrease Outcome: Not Progressing   Problem: Pain Managment: Goal: General experience of comfort will improve Outcome: Not Progressing   Problem: Skin Integrity: Goal: Risk for impaired skin integrity will decrease Outcome: Not Progressing   Problem: Metabolic: Goal: Ability to maintain appropriate glucose levels will improve Outcome: Not Progressing   Problem: Skin Integrity: Goal: Risk for impaired skin integrity will decrease Outcome: Not Progressing

## 2023-08-19 NOTE — Progress Notes (Signed)
PROGRESS NOTE    Sherri Gill  YNW:[id number] DOB: [birth year] DOA: 07/15/2023 PCP: [name], MD   Brief Narrative:  72 year old female with a stage IV uterine cancer status post TAH and LSO with peritoneal carcinomatosis currently on active chemotherapy as an outpatient, persistent A-fib on Xarelto, diabetes mellitus type 2, hypertension presented with persistent nausea and vomiting with intolerance to oral intake.  Hospital course complicated with refractory ileus due to carcinomatosis requiring TPN.  She had PEG placed on 07/31/2023.  On 08/24/2023, patient started bleeding from PEG tube insertion site along with large amount of bloody vomitus.  She underwent EGD on 08/04/2023 which showed oozing gastric ulcer with active bleeding at the site of PEG tube insertion.  She returned to the ICU intubated, sedated.  On 08/09/2023, PEG tube came out; NG tube placed for stomach decompression and suction.  She was extubated on 08/10/2023.  Palliative care and oncology following.  She was made DNR after palliative care meeting on 08/11/2023.  She was transferred back to [facility] service from 08/13/2023 onwards.  Assessment & Plan:   Acute upper GI bleeding due to gastric ulcer/erosion at PEG tube site -Hemostasis not achieved on EGD on 08/06/2023.  Bleeding stabilized with reversal of Xarelto. -Continue Protonix PPI twice daily. -H&H stable.  No evidence of bleeding. -If patient rebleeds: Will need IR embolization if family continues to want aggressive treatment -Xarelto to remain on hold.  Acute respiratory failure postprocedure, resolved -Extubated on 08/10/2023. -On room air currently.  Refractory ileus Uterine cancer with peritoneal carcinomatosis Goals of care Generalized deconditioning -Palliative care following.  CODE STATUS has been changed to DNR during this hospitalization by palliative care team. -Not a candidate for further chemotherapy as per oncology and oncology recommended  transitioning to total comfort measures.  Overall prognosis is very poor. -General Surgery has already signed off. -Currently on TPN as per pharmacy.  Continue NPO.  NG tube accidentally got removed on 08/14/2023 but was replaced subsequently. -Had venting gastrostomy tube placed on 07/31/2023 by IR. -Patient is not ready for transitioning to hospice yet.    Leukocytosis -WBCs worsened to 18.7 on 08/18/2023.  Labs pending today.  Monitor.  Fever/SIRS, resolved -Continue to monitor off antibiotics.  Currently afebrile.  Cultures negative so far  Anemia of chronic disease -From chronic illnesses.  Hemoglobin currently stable.  Monitor intermittently  Thrombocytosis -Possibly reactive.  Monitor intermittently  Hypokalemia -Improved   Hyponatremia -Improved.  Acute kidney injury -Possibly from dehydration.  Currently on gentle hydration.  Creatinine improving to 1.22 today.  Repeat a.m. labs.  Acute metabolic acidosis -Bicarb 16 today as well.  Monitor.  Elevated LFTs -AST and ALT slightly elevated on 08/18/2023.  Patient is on TPN.  Monitor.  Hypothyroidism -Oral levothyroxine on hold  Paroxysmal A-fib -Currently mostly rate controlled.  Continue IV metoprolol.  Diltiazem on hold.  Xarelto on hold given risk for rebleeding  Anxiety -Off Precedex drip.  Currently on IV Ativan as needed  DVT prophylaxis: SCDs Code Status: DNR Family Communication: Daughter at bedside on 08/18/2023.  Friend at bedside today. Disposition Plan: Status is: Inpatient Remains inpatient appropriate because: Of severity of illness  Consultants: Oncology/palliative care/GI/PCCM/general surgery/IR  Procedures: As above  Antimicrobials:  Anti-infectives (From admission, onward)    Start     Dose/Rate Route Frequency Ordered Stop   08/09/23 1000  vancomycin (VANCOCIN) IVPB 1000 mg/200 mL premix  Status:  Discontinued        1,000 mg 200 mL/hr over 60  Minutes Intravenous Every 12 hours  08/12/23 2132 08/10/23 0807   08/09/23 0600  ceFEPIme (MAXIPIME) 2 g in sodium chloride 0.9 % 100 mL IVPB  Status:  Discontinued        2 g 200 mL/hr over 30 Minutes Intravenous Every 8 hours 08/12/2023 2132 08/11/23 1132   August 12, 2023 2230  vancomycin (VANCOREADY) IVPB 1750 mg/350 mL        1,750 mg 175 mL/hr over 120 Minutes Intravenous  Once August 12, 2023 2122 08/09/23 0117   08/12/23 2215  vancomycin (VANCOCIN) IVPB 1000 mg/200 mL premix  Status:  Discontinued        1,000 mg 200 mL/hr over 60 Minutes Intravenous  Once 2023-08-12 2117 12-Aug-2023 2122   Aug 12, 2023 2130  ceFEPIme (MAXIPIME) 2 g in sodium chloride 0.9 % 100 mL IVPB        2 g 200 mL/hr over 30 Minutes Intravenous NOW 08-12-2023 2117 2023-08-12 2309   07/31/23 1000  ceFAZolin (ANCEF) IVPB 2g/100 mL premix        2 g 200 mL/hr over 30 Minutes Intravenous On call 07/31/23 0912 07/31/23 1126   07/27/23 1800  micafungin (MYCAMINE) 100 mg in sodium chloride 0.9 % 100 mL IVPB  Status:  Discontinued        100 mg 105 mL/hr over 1 Hours Intravenous Every 24 hours 07/27/23 1718 08/01/23 1404        Subjective: Patient seen and examined at bedside.  No agitation, seizures, fever or shortness of breath reported.  Continues to have intermittent abdominal pain with nausea. Objective: Vitals:   08/19/23 0358 08/19/23 0400 08/19/23 0500 08/19/23 0600  BP:  (!) 120/50 (!) 129/51 127/64  Pulse:  85 88 86  Resp:  (!) 22 (!) 24 (!) 22  Temp: 98.4 F (36.9 C)     TempSrc: Axillary     SpO2:  100% 100% 99%  Weight:   74.6 kg   Height:        Intake/Output Summary (Last 24 hours) at 08/19/2023 0718 Last data filed at 08/19/2023 0630 Gross per 24 hour  Intake 3415.56 ml  Output 2100 ml  Net 1315.56 ml   Filed Weights   08/16/23 0500 08/18/23 0500 08/19/23 0500  Weight: 76.3 kg 74.6 kg 74.6 kg    Examination:  General: In no distress.  On room air.  Chronically ill and deconditioned looking.   ENT/neck: NG tube present.  No obvious neck  masses palpable or JVD elevation noted  respiratory: Bilateral decreased breath sounds at bases with scattered crackles and intermittently tachypneic  CVS: Rate currently controlled; S1 and S2 are heard abdominal: Soft, remains distended and mildly tender; no organomegaly, sluggish bowel sounds.  Gastrostomy tube in place Extremities: No clubbing; trace lower extremity edema present  CNS: Very slow to respond.  Awake.  No obvious focal deficits noted Lymph: No lymphadenopathy palpable  skin: No obvious petechiae/rashes psych: Showing no signs of agitation.  Flat affect.   Musculoskeletal: No obvious joint erythema/tenderness  Data Reviewed: I have personally reviewed following labs and imaging studies  CBC: Recent Labs  Lab 08/12/23 0747 08/13/23 0845 08/14/23 0250 08/15/23 0259 08/18/23 0510  WBC 13.9* 11.7* 11.0* 12.2* 18.7*  NEUTROABS  --   --   --   --  15.2*  HGB 8.5* 10.0* 9.7* 9.9* 10.5*  HCT 26.5* 31.8* 29.4* 31.0* 33.7*  MCV 94.3 95.2 94.2 94.8 97.1  PLT 220 293 335 365 423*   Basic Metabolic Panel: Recent Labs  Lab  08/13/23 0845 08/14/23 0250 08/15/23 0259 08/18/23 0510 08/19/23 0359  NA 132* 128* 130* 134* 138  K 3.4* 3.6 4.1 4.9 3.9  CL 105 102 105 108 116*  CO2 17* 17* 17* 16* 16*  GLUCOSE 169* 165* 169* 234* 201*  BUN 32* 38* 48* 108* 112*  CREATININE 0.79 0.79 0.87 1.45* 1.22*  CALCIUM 10.4* 10.2 10.6* 11.7* 10.6*  MG  --  1.7  --  2.4 2.2  PHOS  --  3.1  --  5.2* 3.7   GFR: Estimated Creatinine Clearance: 41.5 mL/min (A) (by C-G formula based on SCr of 1.22 mg/dL (H)). Liver Function Tests: Recent Labs  Lab 08/12/23 0747 08/13/23 0845 08/14/23 0250 08/18/23 0510  AST 14* 34 39 44*  ALT 16 44 56* 96*  ALKPHOS 155* 249* 328* 589*  BILITOT 0.6 0.6 0.4 0.5  PROT 6.6 7.6 7.9 8.6*  ALBUMIN 2.5* 2.9* 2.8* 3.1*   No results for input(s): "LIPASE", "AMYLASE" in the last 168 hours. No results for input(s): "AMMONIA" in the last 168  hours. Coagulation Profile: No results for input(s): "INR", "PROTIME" in the last 168 hours.  Cardiac Enzymes: No results for input(s): "CKTOTAL", "CKMB", "CKMBINDEX", "TROPONINI" in the last 168 hours. BNP (last 3 results) No results for input(s): "PROBNP" in the last 8760 hours. HbA1C: No results for input(s): "HGBA1C" in the last 72 hours. CBG: Recent Labs  Lab 08/18/23 0610 08/18/23 1145 08/18/23 1740 08/18/23 2358 08/19/23 0600  GLUCAP 207* 177* 166* 165* 182*   Lipid Profile: Recent Labs    08/18/23 0500  TRIG 413*    Thyroid Function Tests: No results for input(s): "TSH", "T4TOTAL", "FREET4", "T3FREE", "THYROIDAB" in the last 72 hours. Anemia Panel: No results for input(s): "VITAMINB12", "FOLATE", "FERRITIN", "TIBC", "IRON", "RETICCTPCT" in the last 72 hours. Sepsis Labs: No results for input(s): "PROCALCITON", "LATICACIDVEN" in the last 168 hours.   No results found for this or any previous visit (from the past 240 hour(s)).        Radiology Studies: No results found.      Scheduled Meds:  Chlorhexidine Gluconate Cloth  6 each Topical Daily   dorzolamide-timolol  1 drop Both Eyes BID   latanoprost  1 drop Both Eyes QHS   metoprolol tartrate  5 mg Intravenous Q6H   nystatin  5 mL Oral QID   pantoprazole (PROTONIX) IV  40 mg Intravenous BID AC   sodium chloride flush  10 mL Intravenous Q12H   sodium chloride flush  10-40 mL Intracatheter Q12H   Continuous Infusions:  sodium chloride 100 mL/hr at 08/19/23 0628   TPN (CLINIMIX) Adult without lytes 52 mL/hr at 08/19/23 0600          Glade Lloyd, MD [facility] 08/19/2023, 7:18 AM

## 2023-08-20 DIAGNOSIS — K567 Ileus, unspecified: Secondary | ICD-10-CM | POA: Diagnosis not present

## 2023-08-20 LAB — COMPREHENSIVE METABOLIC PANEL
ALT: 75 U/L — ABNORMAL HIGH (ref 0–44)
AST: 40 U/L (ref 15–41)
Albumin: 2.8 g/dL — ABNORMAL LOW (ref 3.5–5.0)
Alkaline Phosphatase: 455 U/L — ABNORMAL HIGH (ref 38–126)
Anion gap: 6 (ref 5–15)
BUN: 98 mg/dL — ABNORMAL HIGH (ref 8–23)
CO2: 15 mmol/L — ABNORMAL LOW (ref 22–32)
Calcium: 10.8 mg/dL — ABNORMAL HIGH (ref 8.9–10.3)
Chloride: 120 mmol/L — ABNORMAL HIGH (ref 98–111)
Creatinine, Ser: 1.16 mg/dL — ABNORMAL HIGH (ref 0.44–1.00)
GFR, Estimated: 51 mL/min — ABNORMAL LOW (ref >60)
Glucose, Bld: 188 mg/dL — ABNORMAL HIGH (ref 70–99)
Potassium: 3.1 mmol/L — ABNORMAL LOW (ref 3.5–5.1)
Sodium: 141 mmol/L (ref 135–145)
Total Bilirubin: 0.5 mg/dL (ref 0.3–1.2)
Total Protein: 7.8 g/dL (ref 6.5–8.1)

## 2023-08-20 LAB — CBC WITH DIFFERENTIAL/PLATELET
Abs Immature Granulocytes: 0.14 10*3/uL — ABNORMAL HIGH (ref 0.00–0.07)
Basophils Absolute: 0 10*3/uL (ref 0.0–0.1)
Basophils Relative: 0 %
Eosinophils Absolute: 0.1 10*3/uL (ref 0.0–0.5)
Eosinophils Relative: 0 %
HCT: 29.6 % — ABNORMAL LOW (ref 36.0–46.0)
Hemoglobin: 9 g/dL — ABNORMAL LOW (ref 12.0–15.0)
Immature Granulocytes: 1 %
Lymphocytes Relative: 10 %
Lymphs Abs: 1.2 10*3/uL (ref 0.7–4.0)
MCH: 29.7 pg (ref 26.0–34.0)
MCHC: 30.4 g/dL (ref 30.0–36.0)
MCV: 97.7 fL (ref 80.0–100.0)
Monocytes Absolute: 1.6 10*3/uL — ABNORMAL HIGH (ref 0.1–1.0)
Monocytes Relative: 12 %
Neutro Abs: 9.9 10*3/uL — ABNORMAL HIGH (ref 1.7–7.7)
Neutrophils Relative %: 77 %
Platelets: 323 10*3/uL (ref 150–400)
RBC: 3.03 MIL/uL — ABNORMAL LOW (ref 3.87–5.11)
RDW: 15.9 % — ABNORMAL HIGH (ref 11.5–15.5)
WBC: 12.9 10*3/uL — ABNORMAL HIGH (ref 4.0–10.5)
nRBC: 0.2 % (ref 0.0–0.2)

## 2023-08-20 LAB — GLUCOSE, CAPILLARY
Glucose-Capillary: 163 mg/dL — ABNORMAL HIGH (ref 70–99)
Glucose-Capillary: 173 mg/dL — ABNORMAL HIGH (ref 70–99)
Glucose-Capillary: 160 mg/dL — ABNORMAL HIGH (ref 70–99)

## 2023-08-20 LAB — MAGNESIUM: Magnesium: 1.9 mg/dL (ref 1.7–2.4)

## 2023-08-20 LAB — TRIGLYCERIDES: Triglycerides: 131 mg/dL (ref <150)

## 2023-08-20 MED ORDER — FAT EMUL FISH OIL/PLANT BASED 20% (SMOFLIPID)IV EMUL
250.0000 mL | INTRAVENOUS | Status: AC
  Administered 2023-08-20: 250 mL via INTRAVENOUS
  Filled 2023-08-20: qty 250

## 2023-08-20 MED ORDER — INFUVITE ADULT IV SOLN
INTRAVENOUS | Status: DC
  Filled 2023-08-20: qty 1248

## 2023-08-20 MED ORDER — INFUVITE ADULT IV SOLN
INTRAVENOUS | Status: AC
  Filled 2023-08-20: qty 1248

## 2023-08-20 MED ORDER — POTASSIUM CHLORIDE 10 MEQ/100ML IV SOLN
10.0000 meq | INTRAVENOUS | Status: AC
  Administered 2023-08-20 (×4): 10 meq via INTRAVENOUS
  Filled 2023-08-20 (×4): qty 100

## 2023-08-20 MED ORDER — SODIUM BICARBONATE 8.4 % IV SOLN
INTRAVENOUS | Status: DC
  Filled 2023-08-20 (×3): qty 150

## 2023-08-20 NOTE — Progress Notes (Addendum)
PHARMACY - TOTAL PARENTERAL NUTRITION CONSULT NOTE   Indication: Prolonged ileus  Patient Measurements: Height: 5\' 3"  (160 cm) Weight: 75 kg (165 lb 5.5 oz) IBW/kg (Calculated) : 52.4 TPN AdjBW (KG): 58.6 Body mass index is 29.29 kg/m.  Assessment:  [name] admitted 9/16 for nausea/vomiting x 2.5 weeks. Patient with h/o recurrent uterine cancer--Bx taken during 9/18 small bowel enteroscopy but no signs of malignant obstruction, negative for malignancy. Oncology recommended resumption of chemotherapy during this admission--carboplatin and paclitaxel, completed 9/25.   Patient has severe malnutrition given inability to keep food down with continued N/V during this admission and PTA. Pharmacy consulted for TPN management.  - Refractory ileus from carcinomatosis causing delayed gastric motility   Glucose / Insulin: + H/o T2DM -BG goal < 180. Range: 140-193 (13 units SSI/24 hrs)  Electrolytes: Na 141 improved, K 3.1 low, Cl 120 higher, Bicarb 15 remains low, CoCa 11.76 remains elevated, Phos 5.2>>3.7 down (not done today 10/23), Mg 1.9  - (removed all electrolytes from TPN 10/21 )  Renal: SCr 1.16, BUN 98  Hepatic: Alk Phos up to 455, AST 40, ALT 75 elevated, Tbili WNL - Albumin 2.8 - TG 216>> up to 413>131 today 10/23  Intake / Output; MIVF: (+2937) - UOP: 800 - NG OP: 650, 1 unmeasured incident - LBM: 10/19 stool x 1 - MIVF: D5NS @50ml /hr=1228ml/d + TPN fluid daily  GIB: due to gastric ulcer/erosion at PEG tube site Reversal of PTA Xarelto with Kcentra, on PPI BID   GI Imaging: - 9/20 DG UGI: Gastric fold thickening as shown on prior CT and endoscopy. Mildly dilated duodenum, with sluggish flow of contrast through the duodenum--possibility of a more distal SBO is not excluded given this appearance. - 9/29 abd Xray: Slight increase in central small bowel dilatation.   GI Surgeries / Procedures:  - 9/18: small bowel endoscopy: Normal appearing, widely patent  esophagus and GEJ. Edematous gastric folds-biopsied. A single duodenal polyp in the post-bulbar duodenum-biopsied - 10/3:  placement of gastrostomy tube today to be used for venting - 10/11: EGD: oozing gastric ulcer with active bleeding at the site of PEG tube insertion.  -10/12: PEG tube came out. NG tube placed for stomach decompression, suction   Central access: port TPN start date:  9/26  Nutritional Goals:   TPN (2L bag Clinimix 8/10 with NO LYTES) + fat administration daily will provide 100g protein and 1327 kcal daily   TPN (2L bag Clinimix 8/10 with NO LYTES) and NO LIPIDS will provide 100g protein and 823 kcal.   RD Assessment: Estimated Needs Total Energy Estimated Needs: 1650-1850 Total Protein Estimated Needs: 85-100g Total Fluid Estimated Needs: 1.8L/day  Current Nutrition:  TPN 9/26 >> NPO  Plan:  Due to the Baxter IV fluid disruption, we will be switching all adult compounded parenteral nutrition to premade Clinimix products +/- fat emulsion infusion. Our goal will be to continue providing as close to 100% of our patient's nutritional needs. Due to the volatile availability of different Clinimix concentrations we may not be able to meet 100% of adult patient nutritional goals at this time.     Con't Clinimix (NO ELECTROLYTES) 8/10 @ 52 ml/hr (at max protein) + change in IVF to Bicarb in D5W at 32ml/hr will + lipids will provide 100g protein and 1567 total kcal over the next 24 hrs. Resume lipids today at 43ml/hr on MWF so as to not drive TG up so high again. (450kcal)  Add MVI to TPN Continue q6h  SSI. Change to moderate scale (10/22)  Change IVF to Bicarb + D5W at 54ml/hr (will provide extra 240kcal),  to increase bicarb with metabolic acidosis and decrease Cl. Changing to D5W or D10W at a higher rate could drive up triglycerides further Will have to assess and enter new order daily with IVF shortage.  TPN labs Mon, Thurs. Check additional labs PRN. No further  chemo. Patient not ready for hospice. F/u to determine long-term disposition. K+76meq IV x 5 per MD orders   Kahli Fitzgerald S. Merilynn Finland, PharmD, BCPS Clinical Staff Pharmacist [email or web address] 08/20/2023 7:30 AM

## 2023-08-20 NOTE — TOC Progression Note (Signed)
Transition of Care [facility]) - Progression Note    Patient Details  Name: Sherri Gill MRN: [id number] Date of Birth: 1950/11/15  Transition of Care [facility]) CM/SW Contact  Lavenia Atlas, RN Phone Number: 08/20/2023, 6:25 PM  Clinical Narrative:   Per chart review patient currently in [facility] SDU and is appropriate for comfort care/hospice however has decided not to pursue.  PT has recommended HHPT and DME: rolling walker and wheelchair. Palliative is following for GOC.  TOC will continue to follow for discharge needs.     Expected Discharge Plan: Home/Self Care Barriers to Discharge: Continued Medical Work up  Expected Discharge Plan and Services In-house Referral: NA Discharge Planning Services: CM Consult Post Acute Care Choice: NA Living arrangements for the past 2 months: Apartment                 DME Arranged: N/A                     Social Determinants of Health (SDOH) Interventions SDOH Screenings   Food Insecurity: No Food Insecurity (07/14/2023)  Housing: Low Risk  (07/14/2023)  Transportation Needs: No Transportation Needs (07/14/2023)  Utilities: Not At Risk (07/14/2023)  Alcohol Screen: Low Risk  (02/20/2023)  Depression (PHQ2-9): Medium Risk (03/25/2023)  Financial Resource Strain: Medium Risk (02/20/2023)  Physical Activity: Inactive (02/20/2023)  Social Connections: Unknown (02/20/2023)  Stress: Stress Concern Present (02/20/2023)  Tobacco Use: Medium Risk (07/14/2023)    Readmission Risk Interventions    08/08/2023    6:50 PM 07/16/2023   11:28 AM  Readmission Risk Prevention Plan  Transportation Screening Complete Complete  PCP or Specialist Appt within 3-5 Days  Complete  HRI or Home Care Consult  Complete  Social Work Consult for Recovery Care Planning/Counseling  Complete  Palliative Care Screening  Not Applicable  Medication Review [employer]) Complete Referral to Pharmacy  PCP or Specialist appointment within 3-5 days of discharge Complete    HRI or Home Care Consult Complete   SW Recovery Care/Counseling Consult Complete   Skilled Nursing Facility Not Applicable

## 2023-08-20 NOTE — Plan of Care (Signed)
  Problem: Education: Goal: Knowledge of General Education information will improve Description: Including pain rating scale, medication(s)/side effects and non-pharmacologic comfort measures Outcome: Progressing   Problem: Health Behavior/Discharge Planning: Goal: Ability to manage health-related needs will improve Outcome: Progressing   Problem: Clinical Measurements: Goal: Will remain free from infection Outcome: Progressing Goal: Diagnostic test results will improve Outcome: Progressing Goal: Cardiovascular complication will be avoided Outcome: Progressing   Problem: Activity: Goal: Risk for activity intolerance will decrease Outcome: Progressing   Problem: Nutrition: Goal: Adequate nutrition will be maintained Outcome: Progressing   Problem: Elimination: Goal: Will not experience complications related to urinary retention Outcome: Progressing   Problem: Safety: Goal: Ability to remain free from injury will improve Outcome: Progressing   Problem: Education: Goal: Ability to describe self-care measures that may prevent or decrease complications (Diabetes Survival Skills Education) will improve Outcome: Progressing Goal: Individualized Educational Video(s) Outcome: Progressing   Problem: Coping: Goal: Ability to adjust to condition or change in health will improve Outcome: Progressing   Problem: Fluid Volume: Goal: Ability to maintain a balanced intake and output will improve Outcome: Progressing   Problem: Health Behavior/Discharge Planning: Goal: Ability to identify and utilize available resources and services will improve Outcome: Progressing Goal: Ability to manage health-related needs will improve Outcome: Progressing   Problem: Metabolic: Goal: Ability to maintain appropriate glucose levels will improve Outcome: Progressing   Problem: Nutritional: Goal: Maintenance of adequate nutrition will improve Outcome: Progressing Goal: Progress toward  achieving an optimal weight will improve Outcome: Progressing   Problem: Skin Integrity: Goal: Risk for impaired skin integrity will decrease Outcome: Progressing   Problem: Tissue Perfusion: Goal: Adequacy of tissue perfusion will improve Outcome: Progressing   Problem: Activity: Goal: Ability to tolerate increased activity will improve Outcome: Progressing   Problem: Respiratory: Goal: Ability to maintain a clear airway and adequate ventilation will improve Outcome: Progressing   Problem: Role Relationship: Goal: Method of communication will improve Outcome: Progressing   Problem: Fluid Volume: Goal: Hemodynamic stability will improve Outcome: Progressing   Problem: Clinical Measurements: Goal: Diagnostic test results will improve Outcome: Progressing Goal: Signs and symptoms of infection will decrease Outcome: Progressing   Problem: Respiratory: Goal: Ability to maintain adequate ventilation will improve Outcome: Progressing   Problem: Clinical Measurements: Goal: Ability to maintain clinical measurements within normal limits will improve Outcome: Not Progressing   Problem: Coping: Goal: Level of anxiety will decrease Outcome: Not Progressing   Problem: Elimination: Goal: Will not experience complications related to bowel motility Outcome: Not Progressing   Problem: Pain Managment: Goal: General experience of comfort will improve Outcome: Not Progressing   Problem: Skin Integrity: Goal: Risk for impaired skin integrity will decrease Outcome: Not Progressing

## 2023-08-20 NOTE — Progress Notes (Signed)
PROGRESS NOTE    Sherri Gill  WUJ:[id number] DOB: 08/24/1951 DOA: 07-30-2023 PCP: [name], MD   Brief Narrative:  72 year old female with stage IV uterine cancer status post TAH and LSO with peritoneal carcinomatosis was on active chemotherapy as an outpatient, persistent A-fib on Xarelto, diabetes mellitus type 2, hypertension presented with persistent nausea and vomiting with intolerance to oral intake.  Hospital course complicated with refractory ileus due to carcinomatosis requiring TPN.  She had PEG placed on 07/31/2023.  On 08/14/2023, patient started bleeding from PEG tube insertion site along with large amount of bloody vomitus.  She underwent EGD on 08/18/2023 which showed oozing gastric ulcer with active bleeding at the site of PEG tube insertion.  She returned to the ICU intubated, sedated.  On 08/09/2023, PEG tube came out; NG tube placed for stomach decompression and suction.  She was extubated on 08/10/2023.  Palliative care and oncology following.  She was made DNR after palliative care meeting on 08/11/2023.  She was transferred back to [facility] service from 08/13/2023 onwards.  Patient remains in the hospital.  She is in very poor clinical status.  Patient is comfort care and hospice appropriate, however patient is not willing to enroll into hospice at this time. Goals of care are clear as per palliative care documentation, comfort care if decompensates. Patient likely will die in the hospital, unlikely to get out of the hospital.  Assessment & Plan:   Acute upper GI bleeding due to gastric ulcer/erosion at PEG tube site -Hemostasis not achieved on EGD on 07/30/2023.  Bleeding stabilized with reversal of Xarelto. -Continue Protonix PPI twice daily. -H&H stable.  No evidence of bleeding. -If patient has re-bleed , she may not survive.  -Xarelto can not be started.   Acute respiratory failure postprocedure, resolved -Extubated on 08/10/2023. -On room air  currently.  Refractory ileus Uterine cancer with peritoneal carcinomatosis Goals of care Generalized deconditioning -Palliative care following.  CODE STATUS has been changed to DNR during this hospitalization by palliative care team. -Not a candidate for further chemotherapy as per oncology and oncology recommended transitioning to total comfort measures.  Overall prognosis is very poor. -General Surgery has already signed off. -Currently on TPN as per pharmacy.  Continue NPO.  NG tube accidentally got removed on 08/14/2023 but was replaced subsequently. -Had venting gastrostomy tube placed on 07/31/2023 by IR. -Patient is not willing to transitioning to hospice yet.    Fever/SIRS, resolved -Continue to monitor off antibiotics.  Currently afebrile.  Cultures negative so far  Anemia of chronic disease -From chronic illnesses.  Hemoglobin currently stable.  Monitor intermittently  Thrombocytosis -Possibly reactive.  Monitor intermittently  Acute kidney injury -Possibly from dehydration.  Currently on gentle hydration.  Creatinine improving to 1.22 today.  Repeat a.m. labs.  Acute metabolic acidosis -Bicarb 15 today as well.  Monitor.  Elevated LFTs -AST and ALT slightly elevated on 08/18/2023.  Patient is on TPN.  Monitor.  Hypothyroidism -Oral levothyroxine on hold  Paroxysmal A-fib -Currently mostly rate controlled.  Continue IV metoprolol.  Diltiazem on hold.  Xarelto on hold given risk for rebleeding  Anxiety -Off Precedex drip.  Currently on IV Ativan as needed  DVT prophylaxis: SCDs Code Status: DNR Family Communication: Daughter at bedside  Disposition Plan: Status is: Inpatient Remains inpatient appropriate because: Severely ill.  On TPN. Patient can transfer to progressive bed where they can be managed TPN. Since patient is not agreeable to palliation and hospice, not likely able to get  out of the hospital.  Consultants: Oncology/palliative care/GI/PCCM/general  surgery/IR  Procedures: As above  Antimicrobials:  Anti-infectives (From admission, onward)    Start     Dose/Rate Route Frequency Ordered Stop   08/09/23 1000  vancomycin (VANCOCIN) IVPB 1000 mg/200 mL premix  Status:  Discontinued        1,000 mg 200 mL/hr over 60 Minutes Intravenous Every 12 hours 08/13/2023 2132 08/10/23 0807   08/09/23 0600  ceFEPIme (MAXIPIME) 2 g in sodium chloride 0.9 % 100 mL IVPB  Status:  Discontinued        2 g 200 mL/hr over 30 Minutes Intravenous Every 8 hours 08/24/2023 2132 08/11/23 1132   08/20/2023 2230  vancomycin (VANCOREADY) IVPB 1750 mg/350 mL        1,750 mg 175 mL/hr over 120 Minutes Intravenous  Once 08/24/2023 2122 08/09/23 0117   08/01/2023 2215  vancomycin (VANCOCIN) IVPB 1000 mg/200 mL premix  Status:  Discontinued        1,000 mg 200 mL/hr over 60 Minutes Intravenous  Once 07/30/2023 2117 08/19/2023 2122   08/23/2023 2130  ceFEPIme (MAXIPIME) 2 g in sodium chloride 0.9 % 100 mL IVPB        2 g 200 mL/hr over 30 Minutes Intravenous NOW 08/07/2023 2117 08/10/2023 2309   07/31/23 1000  ceFAZolin (ANCEF) IVPB 2g/100 mL premix        2 g 200 mL/hr over 30 Minutes Intravenous On call 07/31/23 0912 07/31/23 1126   07/27/23 1800  micafungin (MYCAMINE) 100 mg in sodium chloride 0.9 % 100 mL IVPB  Status:  Discontinued        100 mg 105 mL/hr over 1 Hours Intravenous Every 24 hours 07/27/23 1718 08/01/23 1404        Subjective: Patient seen and examined.  Her daughter was at the bedside.  Patient was not talking much.  Patient tells me that she is not hurting.  Her daughter at the bedside told me that she only used 1 dose of fentanyl overnight for belly pain.  I did talk to patient and her daughter about whether she is agreeable to start comfort care, daughter tells me "we are not there yet".  Patient remains in very poor clinical status and can decompensate anytime. Objective: Vitals:   08/20/23 0700 08/20/23 0800 08/20/23 0900 08/20/23 1000  BP: (!) 123/59  (!) 128/57 109/65 (!) 120/57  Pulse:      Resp: 18 (!) 21 (!) 22 19  Temp:  97.6 F (36.4 C)    TempSrc:  Oral    SpO2:   100%   Weight:      Height:        Intake/Output Summary (Last 24 hours) at 08/20/2023 1040 Last data filed at 08/20/2023 1014 Gross per 24 hour  Intake 3133.91 ml  Output 1450 ml  Net 1683.91 ml   Filed Weights   08/18/23 0500 08/19/23 0500 08/20/23 0500  Weight: 74.6 kg 74.6 kg 75 kg    Examination:  General: Sick looking.  Flat affect.  Alert and awake.  Oriented to herself.  Not very interactive. Cardiovascular: S1-S2 normal.  Tachycardic. Respiratory: Bilateral poor air entry.  Poor inspiratory effort. Gastrointestinal: NG tube with feculent drainage.  Abdomen is generalized tenderness, firm.  No bowel sounds.  Previous PEG tube insertion site with colostomy bag, minimal feculent material present. Ext: 1+ edema.   Data Reviewed: I have personally reviewed following labs and imaging studies  CBC: Recent Labs  Lab 08/14/23 0250  08/15/23 0259 08/18/23 0510 08/20/23 0419  WBC 11.0* 12.2* 18.7* 12.9*  NEUTROABS  --   --  15.2* 9.9*  HGB 9.7* 9.9* 10.5* 9.0*  HCT 29.4* 31.0* 33.7* 29.6*  MCV 94.2 94.8 97.1 97.7  PLT 335 365 423* 323   Basic Metabolic Panel: Recent Labs  Lab 08/14/23 0250 08/15/23 0259 08/18/23 0510 08/19/23 0359 08/20/23 0419  NA 128* 130* 134* 138 141  K 3.6 4.1 4.9 3.9 3.1*  CL 102 105 108 116* 120*  CO2 17* 17* 16* 16* 15*  GLUCOSE 165* 169* 234* 201* 188*  BUN 38* 48* 108* 112* 98*  CREATININE 0.79 0.87 1.45* 1.22* 1.16*  CALCIUM 10.2 10.6* 11.7* 10.6* 10.8*  MG 1.7  --  2.4 2.2 1.9  PHOS 3.1  --  5.2* 3.7  --    GFR: Estimated Creatinine Clearance: 43.7 mL/min (A) (by C-G formula based on SCr of 1.16 mg/dL (H)). Liver Function Tests: Recent Labs  Lab 08/14/23 0250 08/18/23 0510 08/20/23 0419  AST 39 44* 40  ALT 56* 96* 75*  ALKPHOS 328* 589* 455*  BILITOT 0.4 0.5 0.5  PROT 7.9 8.6* 7.8  ALBUMIN  2.8* 3.1* 2.8*   No results for input(s): "LIPASE", "AMYLASE" in the last 168 hours. No results for input(s): "AMMONIA" in the last 168 hours. Coagulation Profile: No results for input(s): "INR", "PROTIME" in the last 168 hours.  Cardiac Enzymes: No results for input(s): "CKTOTAL", "CKMB", "CKMBINDEX", "TROPONINI" in the last 168 hours. BNP (last 3 results) No results for input(s): "PROBNP" in the last 8760 hours. HbA1C: No results for input(s): "HGBA1C" in the last 72 hours. CBG: Recent Labs  Lab 08/19/23 0600 08/19/23 1226 08/19/23 1727 08/19/23 2352 08/20/23 0545  GLUCAP 182* 169* 140* 193* 163*   Lipid Profile: Recent Labs    08/18/23 0500 08/20/23 0750  TRIG 413* 131    Thyroid Function Tests: No results for input(s): "TSH", "T4TOTAL", "FREET4", "T3FREE", "THYROIDAB" in the last 72 hours. Anemia Panel: No results for input(s): "VITAMINB12", "FOLATE", "FERRITIN", "TIBC", "IRON", "RETICCTPCT" in the last 72 hours. Sepsis Labs: No results for input(s): "PROCALCITON", "LATICACIDVEN" in the last 168 hours.   No results found for this or any previous visit (from the past 240 hour(s)).        Radiology Studies: No results found.      Scheduled Meds:  Chlorhexidine Gluconate Cloth  6 each Topical Daily   dorzolamide-timolol  1 drop Both Eyes BID   fluticasone  1 spray Each Nare Daily   insulin aspart  0-15 Units Subcutaneous Q6H   latanoprost  1 drop Both Eyes QHS   metoprolol tartrate  5 mg Intravenous Q6H   nystatin  5 mL Oral QID   pantoprazole (PROTONIX) IV  40 mg Intravenous BID AC   sodium chloride flush  10 mL Intravenous Q12H   sodium chloride flush  10-40 mL Intracatheter Q12H   Continuous Infusions:  TPN (CLINIMIX) Adult without lytes     And   fat emul(SMOFlipid)     sodium bicarbonate 150 mEq in dextrose 5 % 1,150 mL infusion 50 mL/hr at 08/20/23 1014   TPN (CLINIMIX) Adult without lytes 52 mL/hr at 08/20/23 1014          Dorcas Carrow, MD [facility] 08/20/2023, 10:40 AM

## 2023-08-21 DIAGNOSIS — K567 Ileus, unspecified: Secondary | ICD-10-CM | POA: Diagnosis not present

## 2023-08-21 LAB — MAGNESIUM: Magnesium: 1.6 mg/dL — ABNORMAL LOW (ref 1.7–2.4)

## 2023-08-21 LAB — COMPREHENSIVE METABOLIC PANEL
ALT: 93 U/L — ABNORMAL HIGH (ref 0–44)
AST: 56 U/L — ABNORMAL HIGH (ref 15–41)
Albumin: 2.8 g/dL — ABNORMAL LOW (ref 3.5–5.0)
Alkaline Phosphatase: 464 U/L — ABNORMAL HIGH (ref 38–126)
Anion gap: 9 (ref 5–15)
BUN: 89 mg/dL — ABNORMAL HIGH (ref 8–23)
CO2: 18 mmol/L — ABNORMAL LOW (ref 22–32)
Calcium: 10.6 mg/dL — ABNORMAL HIGH (ref 8.9–10.3)
Chloride: 112 mmol/L — ABNORMAL HIGH (ref 98–111)
Creatinine, Ser: 1.11 mg/dL — ABNORMAL HIGH (ref 0.44–1.00)
GFR, Estimated: 53 mL/min — ABNORMAL LOW (ref >60)
Glucose, Bld: 226 mg/dL — ABNORMAL HIGH (ref 70–99)
Potassium: 2.8 mmol/L — ABNORMAL LOW (ref 3.5–5.1)
Sodium: 139 mmol/L (ref 135–145)
Total Bilirubin: 0.6 mg/dL (ref 0.3–1.2)
Total Protein: 7.6 g/dL (ref 6.5–8.1)

## 2023-08-21 LAB — GLUCOSE, CAPILLARY
Glucose-Capillary: 171 mg/dL — ABNORMAL HIGH (ref 70–99)
Glucose-Capillary: 174 mg/dL — ABNORMAL HIGH (ref 70–99)
Glucose-Capillary: 186 mg/dL — ABNORMAL HIGH (ref 70–99)
Glucose-Capillary: 189 mg/dL — ABNORMAL HIGH (ref 70–99)

## 2023-08-21 LAB — PHOSPHORUS: Phosphorus: 1.7 mg/dL — ABNORMAL LOW (ref 2.5–4.6)

## 2023-08-21 LAB — TRIGLYCERIDES: Triglycerides: 237 mg/dL — ABNORMAL HIGH (ref <150)

## 2023-08-21 MED ORDER — FAT EMUL FISH OIL/PLANT BASED 20% (SMOFLIPID)IV EMUL
250.0000 mL | INTRAVENOUS | Status: DC
  Administered 2023-08-21: 250 mL via INTRAVENOUS
  Filled 2023-08-21: qty 250

## 2023-08-21 MED ORDER — MAGNESIUM SULFATE 2 GM/50ML IV SOLN
2.0000 g | Freq: Once | INTRAVENOUS | Status: AC
  Administered 2023-08-21: 2 g via INTRAVENOUS
  Filled 2023-08-21: qty 50

## 2023-08-21 MED ORDER — POTASSIUM CHLORIDE 10 MEQ/100ML IV SOLN
INTRAVENOUS | Status: AC
  Filled 2023-08-21: qty 100

## 2023-08-21 MED ORDER — FAT EMULSION PLANT BASED 20% (INTRALIPID)IV EMUL
250.0000 mL | INTRAVENOUS | Status: DC
  Filled 2023-08-21: qty 250

## 2023-08-21 MED ORDER — POTASSIUM CHLORIDE 10 MEQ/50ML IV SOLN
10.0000 meq | INTRAVENOUS | Status: AC
  Administered 2023-08-21 (×6): 10 meq via INTRAVENOUS
  Filled 2023-08-21 (×6): qty 50

## 2023-08-21 MED ORDER — TRACE MINERALS CU-MN-SE-ZN 300-55-60-3000 MCG/ML IV SOLN
INTRAVENOUS | Status: AC
  Filled 2023-08-21: qty 1248

## 2023-08-21 MED ORDER — POTASSIUM PHOSPHATES 15 MMOLE/5ML IV SOLN
20.0000 mmol | Freq: Once | INTRAVENOUS | Status: AC
  Administered 2023-08-21: 20 mmol via INTRAVENOUS
  Filled 2023-08-21: qty 6.67

## 2023-08-21 MED ORDER — POTASSIUM CHLORIDE 10 MEQ/100ML IV SOLN
INTRAVENOUS | Status: AC
  Administered 2023-08-21: 10 meq
  Filled 2023-08-21: qty 100

## 2023-08-21 NOTE — Progress Notes (Signed)
Physical Therapy Discharge Patient Details Name: Sherri Gill MRN: [id number] DOB: [birth year] Today's Date: 08/21/2023 Time:  -     Patient discharged from PT services secondary to medical decline - will need to re-order PT to resume therapy services.Per RN  Please see latest therapy progress note for current level of functioning and progress toward goals.    Progress and discharge plan discussed with patient and/or caregiver: NA  GP    [name] PT Acute Rehabilitation Services Office [phone] Weekend pager-[phone]  [name] 08/21/2023, 12:13 PM

## 2023-08-21 NOTE — Progress Notes (Signed)
OT Cancellation Note  Patient Details Name: Sherri Gill MRN: [id number] DOB: 10/27/1951   Cancelled Treatment:    Reason Eval/Treat Not Completed: Other (comment). Pt now with medical decline and limited prognosis, per pt's medical chart. OT will sign off.   Reuben Likes, OTR/L 08/21/2023, 12:31 PM

## 2023-08-21 NOTE — Progress Notes (Signed)
Report called and given to [place], Charity fundraiser on 4th floor. Pt to be transferred to room 1426 via bed.

## 2023-08-21 NOTE — Progress Notes (Signed)
PHARMACY - TOTAL PARENTERAL NUTRITION CONSULT NOTE   Indication: Prolonged ileus  Patient Measurements: Height: 5\' 3"  (160 cm) Weight: 76.5 kg (168 lb 10.4 oz) IBW/kg (Calculated) : 52.4 TPN AdjBW (KG): 58.6 Body mass index is 29.88 kg/m.  Assessment:  [name] admitted 9/16 for nausea/vomiting x 2.5 weeks. Patient with h/o recurrent uterine cancer--Bx taken during 9/18 small bowel enteroscopy but no signs of malignant obstruction, negative for malignancy. Oncology recommended resumption of chemotherapy during this admission--carboplatin and paclitaxel, completed 9/25. GIB due to gastric ulcer/erosion at PEG tube site s/p reversal of PTA Xarelto with Kcentra, on PPI BID.  Patient has severe malnutrition given inability to keep food down with continued N/V during this admission and PTA. Refractory ileus from carcinomatosis causing delayed gastric motility.  Pharmacy consulted for TPN management.   Glucose / Insulin: H/o T2DM -BG goal < 180. Range: 160-189 (12 units SSI/24 hrs)  Electrolytes: K low/decreased further to 2.8, Mag 1.6 low, and Phos 1.7 low.  Cl elevated/improved, CO2 low/improved. CoCa 11.56 remains elevated - electrolytes removed from TPN 10/21  Renal: SCr 1.11, BUN 89  Hepatic: Alk Phos 464, AST/ALT remain elvated, Tbili WNL - Albumin 2.8 - TG 216, 413 (10/21), 131 (10/23)  Intake / Output; MIVF: - mIVF: NaBicarb in D5 @ 50 mL/hr - UOP: 1300 mL - NG OP: increased to 1300 mL - LBM: 10/19 stool x 1  GI Imaging: - 9/20 DG UGI: Gastric fold thickening as shown on prior CT and endoscopy. Mildly dilated duodenum, with sluggish flow of contrast through the duodenum--possibility of a more distal SBO is not excluded given this appearance. - 9/29 abd Xray: Slight increase in central small bowel dilatation.   GI Surgeries / Procedures:  - 9/18: small bowel endoscopy: Normal appearing, widely patent esophagus and GEJ. Edematous gastric folds-biopsied. A single  duodenal polyp in the post-bulbar duodenum-biopsied - 10/3:  gastrostomy tube placed for venting - 10/11: EGD: oozing gastric ulcer with active bleeding at the site of PEG tube insertion.  -10/12: PEG tube came out. NG tube placed 10/17 for stomach decompression, suction   Central access: port TPN start date:  9/26  Nutritional Goals:  Clinimix 8/10 at 52 ml/hr provides max protein 100g, and 828 KCal Additional Dextrose fluids (5%) running at 62ml/hr provides an additional 204 kcal/day Lipid target 25-30% of total kcal: 412-495 Kcal/day.  SMOF lipid 20% bag provides 450 kcal.    If using every day lipids: total 1482 kcal/day (90% of kcal goal), but currently will use intermittent lipids d/t previous hypertriglyceridemia:  MWF 1032kcal, TTSS 1482 kcal, daily average 1289 kcal (78% goal)    RD Assessment: Estimated Needs Total Energy Estimated Needs: 1650-1850 Total Protein Estimated Needs: 85-100g Total Fluid Estimated Needs: 1.8L/day  Current Nutrition:  TPN 9/26 >> NPO  Plan:  Due to the Baxter IV fluid disruption, all adult compounded parenteral nutrition will be made with premade Clinimix products +/- fat emulsion infusion. Our goal will be to continue providing as close to 100% of our patient's nutritional needs as possible, but  meet at least 80% of protein and 75% of kcal goals due to the volatile availability of different Clinimix concentrations.  Now:   KPhos  (also provides 30 mEq of K) Mag 2g KCl 10 mEq IV x 6 runs  At 18:00:   Continue Clinimix (NO electrolytes) 8/10 at 52 ml/hr Add standard electrolytes, except remove Calcium  Add standard MVI and trace elements  SMOF Lipids  over 12 hours 4 times per week (Tues, Thurs, Sat, Sun), monitor TG closely  Continue q6h CBGs and moderate SSI. Continue Bicarb in D5W infusion at 61ml/hr (will provide extra 240kcal),  to increase bicarb with metabolic acidosis and decrease Cl.  Continue to assess order  daily with ongoing IVF shortage.  TPN labs Mon, Thurs. Check additional labs PRN. No further chemo. Patient not ready for hospice. F/u to determine long-term disposition.   Lynann Beaver PharmD, BCPS [facility] main pharmacy [phone] 08/21/2023 7:57 AM

## 2023-08-21 NOTE — Progress Notes (Signed)
PROGRESS NOTE    Sherri Gill  WUJ:[id number] DOB: June 20, 1951 DOA: 07/28/23 PCP: [name], MD   Brief Narrative:  72 year old female with stage IV uterine cancer status post TAH and LSO with peritoneal carcinomatosis was on active chemotherapy as an outpatient, persistent A-fib on Xarelto, diabetes mellitus type 2, hypertension presented with persistent nausea and vomiting with intolerance to oral intake.  Hospital course complicated with refractory ileus due to carcinomatosis requiring TPN.  She had PEG placed on 07/31/2023.  On 07/30/2023, patient started bleeding from PEG tube insertion site along with large amount of bloody vomitus.  She underwent EGD on 08/25/2023 which showed oozing gastric ulcer with active bleeding at the site of PEG tube insertion.  She returned to the ICU intubated, sedated.  On 08/09/2023, PEG tube came out; NG tube placed for stomach decompression and suction.  She was extubated on 08/10/2023.  Palliative care and oncology following.  She was made DNR after palliative care meeting on 08/11/2023.  She was transferred back to [facility] service from 08/13/2023 onwards.  Patient remains in the hospital.  She is in very poor clinical status.  Patient is comfort care and hospice appropriate, however patient is not willing to enroll into hospice at this time. Goals of care are clear as per palliative care documentation, comfort care if decompensates. Patient is not likely get out of the hospital.  Assessment & Plan:   Acute upper GI bleeding due to gastric ulcer/erosion at PEG tube site -Hemostasis not achieved on EGD on 08/16/2023.  Bleeding stabilized with reversal of Xarelto. -Continue Protonix PPI twice daily. -H&H stable.  No evidence of bleeding. -If patient has re-bleed , she may not survive.  -Xarelto can not be started.   Acute respiratory failure postprocedure, resolved -Extubated on 08/10/2023. -On room air currently.  Minimal oxygen for  support.  Refractory ileus Uterine cancer with peritoneal carcinomatosis At end-of-life Goals of care Generalized deconditioning -Palliative care following.  CODE STATUS has been changed to DNR during this hospitalization by palliative care team. -Not a candidate for further chemotherapy as per oncology and oncology recommended transitioning to total comfort measures.  Overall prognosis is very poor. -General Surgery has already signed off. -Currently on TPN as per pharmacy.  Continue NPO.  NG tube for drainage.   -Had venting gastrostomy tube placed on 07/31/2023 by IR.  Removed. -Patient is not willing to transitioning to hospice yet.   -Anticipating any decompensation event anytime.  Anemia of chronic disease -From chronic illnesses.  Hemoglobin currently stable.  Monitor intermittently  Thrombocytosis -Possibly reactive.  Monitor intermittently  Acute kidney injury -Possibly from dehydration.  Currently on gentle hydration.  Creatinine improving to 1.1 today.  Repeat a.m. labs.  Elevated LFTs -AST and ALT slightly elevated on 08/18/2023.  Patient is on TPN.  Monitor.  Hypothyroidism -Oral levothyroxine on hold.  Cannot take by mouth.  Paroxysmal A-fib -Currently mostly rate controlled.  Continue IV metoprolol.  Diltiazem on hold.  Xarelto on hold given risk for rebleeding  Anxiety -Off Precedex drip.  Currently on IV Ativan as needed  Electrolytes including hypokalemia and hypomagnesemia: Pharmacy to manage with TPN.  Patient is NPO.  She cannot eat.  Constantly vomiting despite having NG tube in place.  DVT prophylaxis: SCDs Code Status: DNR Family Communication: Her friend at the bedside. Disposition Plan: Status is: Inpatient Remains inpatient appropriate because: Severely ill.  On TPN. Patient can transfer to progressive bed where they can be managed TPN. Since patient is  not agreeable to palliation and hospice, not likely able to get out of the  hospital.  Consultants: Oncology/palliative care/GI/PCCM/general surgery/IR  Procedures: As above  Antimicrobials:  Anti-infectives (From admission, onward)    Start     Dose/Rate Route Frequency Ordered Stop   08/09/23 1000  vancomycin (VANCOCIN) IVPB 1000 mg/200 mL premix  Status:  Discontinued        1,000 mg 200 mL/hr over 60 Minutes Intravenous Every 12 hours 08/23/2023 2132 08/10/23 0807   08/09/23 0600  ceFEPIme (MAXIPIME) 2 g in sodium chloride 0.9 % 100 mL IVPB  Status:  Discontinued        2 g 200 mL/hr over 30 Minutes Intravenous Every 8 hours 08/28/2023 2132 08/11/23 1132   08/05/2023 2230  vancomycin (VANCOREADY) IVPB 1750 mg/350 mL        1,750 mg 175 mL/hr over 120 Minutes Intravenous  Once 08/24/2023 2122 08/09/23 0117   08/03/2023 2215  vancomycin (VANCOCIN) IVPB 1000 mg/200 mL premix  Status:  Discontinued        1,000 mg 200 mL/hr over 60 Minutes Intravenous  Once 08/23/2023 2117 08/22/2023 2122   08/10/2023 2130  ceFEPIme (MAXIPIME) 2 g in sodium chloride 0.9 % 100 mL IVPB        2 g 200 mL/hr over 30 Minutes Intravenous NOW 08/25/2023 2117 08/18/2023 2309   07/31/23 1000  ceFAZolin (ANCEF) IVPB 2g/100 mL premix        2 g 200 mL/hr over 30 Minutes Intravenous On call 07/31/23 0912 07/31/23 1126   07/27/23 1800  micafungin (MYCAMINE) 100 mg in sodium chloride 0.9 % 100 mL IVPB  Status:  Discontinued        100 mg 105 mL/hr over 1 Hours Intravenous Every 24 hours 07/27/23 1718 08/01/23 1404        Subjective:  Patient seen and examined.  Her friend was trying to give her some ice chips and patient had explosive vomiting of biliary materials, had transient bradycardia.  Suctioned and situated.  Patient is unable to tell me whether she is hurting.  She is very quiet and mostly lethargic.   Objective: Vitals:   08/21/23 0600 08/21/23 0700 08/21/23 0800 08/21/23 0900  BP: (!) 120/56 (!) 122/59 (!) 129/59 (!) 128/58  Pulse:      Resp: (!) 21 20 19  (!) 21  Temp:   98.5 F  (36.9 C)   TempSrc:   Oral   SpO2:   100%   Weight:      Height:        Intake/Output Summary (Last 24 hours) at 08/21/2023 0949 Last data filed at 08/21/2023 0615 Gross per 24 hour  Intake 3049.53 ml  Output 2600 ml  Net 449.53 ml   Filed Weights   08/19/23 0500 08/20/23 0500 08/21/23 0500  Weight: 74.6 kg 75 kg 76.5 kg    Examination:  General: Sick looking.  Flat affect.  Alert and awake.  Lethargic.  Not very interactive. Cardiovascular: S1-S2 normal.   Respiratory: Bilateral poor air entry.  Poor inspiratory effort.  Conducted airway sounds. Gastrointestinal: NG tube with biliary drainage..  Abdomen is generalized tenderness, firm.  No bowel sounds.  Previous PEG tube insertion site with colostomy bag, minimal biliary material present. Ext: 1+ edema.   Data Reviewed: I have personally reviewed following labs and imaging studies  CBC: Recent Labs  Lab 08/15/23 0259 08/18/23 0510 08/20/23 0419  WBC 12.2* 18.7* 12.9*  NEUTROABS  --  15.2* 9.9*  HGB  9.9* 10.5* 9.0*  HCT 31.0* 33.7* 29.6*  MCV 94.8 97.1 97.7  PLT 365 423* 323   Basic Metabolic Panel: Recent Labs  Lab 08/15/23 0259 08/18/23 0510 08/19/23 0359 08/20/23 0419 08/21/23 0555  NA 130* 134* 138 141 139  K 4.1 4.9 3.9 3.1* 2.8*  CL 105 108 116* 120* 112*  CO2 17* 16* 16* 15* 18*  GLUCOSE 169* 234* 201* 188* 226*  BUN 48* 108* 112* 98* 89*  CREATININE 0.87 1.45* 1.22* 1.16* 1.11*  CALCIUM 10.6* 11.7* 10.6* 10.8* 10.6*  MG  --  2.4 2.2 1.9 1.6*  PHOS  --  5.2* 3.7  --  1.7*   GFR: Estimated Creatinine Clearance: 46.2 mL/min (A) (by C-G formula based on SCr of 1.11 mg/dL (H)). Liver Function Tests: Recent Labs  Lab 08/18/23 0510 08/20/23 0419 08/21/23 0555  AST 44* 40 56*  ALT 96* 75* 93*  ALKPHOS 589* 455* 464*  BILITOT 0.5 0.5 0.6  PROT 8.6* 7.8 7.6  ALBUMIN 3.1* 2.8* 2.8*   No results for input(s): "LIPASE", "AMYLASE" in the last 168 hours. No results for input(s): "AMMONIA" in  the last 168 hours. Coagulation Profile: No results for input(s): "INR", "PROTIME" in the last 168 hours.  Cardiac Enzymes: No results for input(s): "CKTOTAL", "CKMB", "CKMBINDEX", "TROPONINI" in the last 168 hours. BNP (last 3 results) No results for input(s): "PROBNP" in the last 8760 hours. HbA1C: No results for input(s): "HGBA1C" in the last 72 hours. CBG: Recent Labs  Lab 08/20/23 0545 08/20/23 1125 08/20/23 1730 08/21/23 0037 08/21/23 0546  GLUCAP 163* 173* 160* 189* 174*   Lipid Profile: Recent Labs    08/20/23 0750  TRIG 131    Thyroid Function Tests: No results for input(s): "TSH", "T4TOTAL", "FREET4", "T3FREE", "THYROIDAB" in the last 72 hours. Anemia Panel: No results for input(s): "VITAMINB12", "FOLATE", "FERRITIN", "TIBC", "IRON", "RETICCTPCT" in the last 72 hours. Sepsis Labs: No results for input(s): "PROCALCITON", "LATICACIDVEN" in the last 168 hours.   No results found for this or any previous visit (from the past 240 hour(s)).        Radiology Studies: No results found.      Scheduled Meds:  Chlorhexidine Gluconate Cloth  6 each Topical Daily   dorzolamide-timolol  1 drop Both Eyes BID   fluticasone  1 spray Each Nare Daily   insulin aspart  0-15 Units Subcutaneous Q6H   latanoprost  1 drop Both Eyes QHS   metoprolol tartrate  5 mg Intravenous Q6H   nystatin  5 mL Oral QID   pantoprazole (PROTONIX) IV  40 mg Intravenous BID AC   sodium chloride flush  10 mL Intravenous Q12H   sodium chloride flush  10-40 mL Intracatheter Q12H   Continuous Infusions:  potassium PHOSPHATE IVPB (in mmol)     sodium bicarbonate 150 mEq in dextrose 5 % 1,150 mL infusion 50 mL/hr at 08/21/23 0615   TPN (CLINIMIX) Adult without lytes 52 mL/hr at 08/21/23 0615          Dorcas Carrow, MD [facility] 08/21/2023, 9:49 AM

## 2023-08-22 DIAGNOSIS — K567 Ileus, unspecified: Secondary | ICD-10-CM | POA: Diagnosis not present

## 2023-08-22 LAB — PHOSPHORUS: Phosphorus: 3.3 mg/dL (ref 2.5–4.6)

## 2023-08-22 LAB — COMPREHENSIVE METABOLIC PANEL
ALT: 90 U/L — ABNORMAL HIGH (ref 0–44)
AST: 44 U/L — ABNORMAL HIGH (ref 15–41)
Albumin: 2.8 g/dL — ABNORMAL LOW (ref 3.5–5.0)
Alkaline Phosphatase: 425 U/L — ABNORMAL HIGH (ref 38–126)
Anion gap: 8 (ref 5–15)
BUN: 83 mg/dL — ABNORMAL HIGH (ref 8–23)
CO2: 20 mmol/L — ABNORMAL LOW (ref 22–32)
Calcium: 10.1 mg/dL (ref 8.9–10.3)
Chloride: 107 mmol/L (ref 98–111)
Creatinine, Ser: 1.15 mg/dL — ABNORMAL HIGH (ref 0.44–1.00)
GFR, Estimated: 51 mL/min — ABNORMAL LOW (ref >60)
Glucose, Bld: 205 mg/dL — ABNORMAL HIGH (ref 70–99)
Potassium: 3.2 mmol/L — ABNORMAL LOW (ref 3.5–5.1)
Sodium: 135 mmol/L (ref 135–145)
Total Bilirubin: 0.6 mg/dL (ref 0.3–1.2)
Total Protein: 7.6 g/dL (ref 6.5–8.1)

## 2023-08-22 LAB — TRIGLYCERIDES: Triglycerides: 288 mg/dL — ABNORMAL HIGH (ref <150)

## 2023-08-22 LAB — GLUCOSE, CAPILLARY
Glucose-Capillary: 158 mg/dL — ABNORMAL HIGH (ref 70–99)
Glucose-Capillary: 162 mg/dL — ABNORMAL HIGH (ref 70–99)
Glucose-Capillary: 168 mg/dL — ABNORMAL HIGH (ref 70–99)
Glucose-Capillary: 192 mg/dL — ABNORMAL HIGH (ref 70–99)
Glucose-Capillary: 193 mg/dL — ABNORMAL HIGH (ref 70–99)

## 2023-08-22 LAB — MAGNESIUM: Magnesium: 2.1 mg/dL (ref 1.7–2.4)

## 2023-08-22 MED ORDER — FLUTICASONE PROPIONATE 50 MCG/ACT NA SUSP
2.0000 | Freq: Every day | NASAL | Status: DC
  Filled 2023-08-22: qty 16

## 2023-08-22 MED ORDER — TRACE MINERALS CU-MN-SE-ZN 300-55-60-3000 MCG/ML IV SOLN
INTRAVENOUS | Status: AC
  Filled 2023-08-22: qty 1248

## 2023-08-22 MED ORDER — POTASSIUM CHLORIDE 10 MEQ/100ML IV SOLN
10.0000 meq | INTRAVENOUS | Status: AC
Start: 2023-08-22 — End: 2023-08-22
  Administered 2023-08-22 (×4): 10 meq via INTRAVENOUS
  Filled 2023-08-22 (×4): qty 100

## 2023-08-22 MED ORDER — FLUTICASONE PROPIONATE 50 MCG/ACT NA SUSP
2.0000 | Freq: Every day | NASAL | Status: DC
  Administered 2023-08-23: 2 via NASAL

## 2023-08-22 MED ORDER — SODIUM BICARBONATE 8.4 % IV SOLN: INTRAVENOUS | Status: DC

## 2023-08-22 NOTE — Plan of Care (Signed)
  Problem: Clinical Measurements: Goal: Ability to maintain clinical measurements within normal limits will improve Outcome: Not Progressing Goal: Will remain free from infection Outcome: Not Progressing Goal: Diagnostic test results will improve Outcome: Not Progressing   Problem: Nutrition: Goal: Adequate nutrition will be maintained Outcome: Not Progressing   Problem: Coping: Goal: Level of anxiety will decrease Outcome: Not Progressing   Problem: Pain Managment: Goal: General experience of comfort will improve Outcome: Not Progressing

## 2023-08-22 NOTE — Progress Notes (Signed)
PROGRESS NOTE    Sherri Gill  ZOX:[id number] DOB: 1951/06/07 DOA: 07/06/2023 PCP: [name], MD   Brief Narrative:  72 year old female with stage IV uterine cancer status post TAH and LSO with peritoneal carcinomatosis was on active chemotherapy as an outpatient, persistent A-fib on Xarelto, diabetes mellitus type 2, hypertension presented with persistent nausea and vomiting with intolerance to oral intake.  Hospital course complicated with refractory ileus due to carcinomatosis requiring TPN.  She had PEG placed on 07/31/2023.  On 2023/08/29, patient started bleeding from PEG tube insertion site along with large amount of bloody vomitus.  She underwent EGD on Aug 29, 2023 which showed oozing gastric ulcer with active bleeding at the site of PEG tube insertion.  She returned to the ICU intubated, sedated.  On 08/09/2023, PEG tube came out; NG tube placed for stomach decompression and suction.  She was extubated on 08/10/2023.  Palliative care and oncology following.  She was made DNR after palliative care meeting on 08/11/2023.  She was transferred back to [facility] service from 08/13/2023 onwards.  Patient remains in the hospital.  She is in very poor clinical status.  Patient is comfort care and hospice appropriate, however patient and family is not willing to enroll into hospice at this time. Goals of care are clear as per palliative care documentation, comfort care if decompensates. Patient is not likely get out of the hospital.  Assessment & Plan:   Acute upper GI bleeding due to gastric ulcer/erosion at PEG tube site -Hemostasis not achieved on EGD on August 29, 2023.  Bleeding stabilized with reversal of Xarelto. -Continue Protonix PPI twice daily. -H&H stable.  No evidence of bleeding. -If patient has re-bleed , she may not survive.  -Xarelto can not be started.   Acute respiratory failure postprocedure, resolved -Extubated on 08/10/2023. -On room air currently.  Minimal oxygen for  support.  Refractory ileus Uterine cancer with peritoneal carcinomatosis At end-of-life Goals of care Generalized deconditioning  -Palliative care following.  CODE STATUS has been changed to DNR during this hospitalization by palliative care team. -Not a candidate for further chemotherapy as per oncology and oncology recommended transitioning to total comfort measures.  Overall prognosis is very poor. -General Surgery has already signed off. -Currently on TPN as per pharmacy.  Continue NPO.  NG tube for drainage.   -Had venting gastrostomy tube placed on 07/31/2023 by IR.  Removed. -Patient is not willing to transitioning to hospice yet.   -Anticipating decompensation event anytime.  Anemia of chronic disease -From chronic illnesses.  Hemoglobin currently stable.  Monitor intermittently  Acute kidney injury -Possibly from dehydration.  Currently on gentle hydration.  Creatinine improving to 1.1 today.    Elevated LFTs -AST and ALT slightly elevated on 08/18/2023.  Patient is on TPN.  Monitor.  Hypothyroidism -Oral levothyroxine on hold.  Cannot take by mouth.  Paroxysmal A-fib -Currently mostly rate controlled.  Continue IV metoprolol.  Diltiazem on hold.  Xarelto on hold given risk for rebleeding  Anxiety -Off Precedex drip.  Currently on IV Ativan as needed and fentanyl.   Electrolytes including hypokalemia and hypomagnesemia: Pharmacy to manage with TPN.  Patient is NPO.  She cannot eat.  Constantly vomiting despite having NG tube in place. Patient can have Swiss and spit soda, hard candies, ice chips as much as she can do.  DVT prophylaxis: SCDs Code Status: DNR Family Communication: Daughter at the bedside. Disposition Plan: Status is: Inpatient Remains inpatient appropriate because: Severely ill.  On TPN. Since patient is  not agreeable to palliation and hospice, not likely able to get out of the hospital.  Consultants: Oncology/palliative care/GI/PCCM/general  surgery/IR  Procedures: As above  Antimicrobials:  Anti-infectives (From admission, onward)    Start     Dose/Rate Route Frequency Ordered Stop   08/09/23 1000  vancomycin (VANCOCIN) IVPB 1000 mg/200 mL premix  Status:  Discontinued        1,000 mg 200 mL/hr over 60 Minutes Intravenous Every 12 hours 08/22/2023 2132 08/10/23 0807   08/09/23 0600  ceFEPIme (MAXIPIME) 2 g in sodium chloride 0.9 % 100 mL IVPB  Status:  Discontinued        2 g 200 mL/hr over 30 Minutes Intravenous Every 8 hours 08/23/2023 2132 08/11/23 1132   08/25/2023 2230  vancomycin (VANCOREADY) IVPB 1750 mg/350 mL        1,750 mg 175 mL/hr over 120 Minutes Intravenous  Once 08/28/2023 2122 08/09/23 0117   08/28/2023 2215  vancomycin (VANCOCIN) IVPB 1000 mg/200 mL premix  Status:  Discontinued        1,000 mg 200 mL/hr over 60 Minutes Intravenous  Once 08/10/2023 2117 07/30/2023 2122   08/07/2023 2130  ceFEPIme (MAXIPIME) 2 g in sodium chloride 0.9 % 100 mL IVPB        2 g 200 mL/hr over 30 Minutes Intravenous NOW 08/14/2023 2117 08/18/2023 2309   07/31/23 1000  ceFAZolin (ANCEF) IVPB 2g/100 mL premix        2 g 200 mL/hr over 30 Minutes Intravenous On call 07/31/23 0912 07/31/23 1126   07/27/23 1800  micafungin (MYCAMINE) 100 mg in sodium chloride 0.9 % 100 mL IVPB  Status:  Discontinued        100 mg 105 mL/hr over 1 Hours Intravenous Every 24 hours 07/27/23 1718 08/01/23 1404        Subjective:  Patient seen and examined.  She was able to answer appropriately today.  Patient and daughter tell me that someone brought some flowers yesterday and patient started sneezing and now had a headache.  Improved with fentanyl.  Patient herself tells me that her belly is not hurting.  She is not able to participate further in conversation but she tells me that she has no further questions.  I encouraged patient and also family to ask for pain medications and anxiety medications as much as they need. NG tube output 1500 mL last 24  hours.   Objective: Vitals:   08/22/23 0149 08/22/23 0518 08/22/23 1000 08/22/23 1317  BP: 125/65 107/78 (!) 121/57 118/60  Pulse: 83 99 86 78  Resp: 16 18 20 20   Temp: 97.7 F (36.5 C) 97.9 F (36.6 C) 97.6 F (36.4 C) 97.7 F (36.5 C)  TempSrc: Oral Oral Axillary Oral  SpO2: 99% 98% 100% 99%  Weight:  70.9 kg    Height:        Intake/Output Summary (Last 24 hours) at 08/22/2023 1421 Last data filed at 08/22/2023 1322 Gross per 24 hour  Intake 2769.11 ml  Output 3400 ml  Net -630.89 ml   Filed Weights   08/20/23 0500 08/21/23 0500 08/22/23 0518  Weight: 75 kg 76.5 kg 70.9 kg    Examination:  General: very Sick looking.  Flat affect.  Alert and awake.  Lethargic.  Not very interactive but answers basic questions.  Cardiovascular: S1-S2 normal.   Respiratory: Bilateral poor air entry.  Poor inspiratory effort.   Gastrointestinal: NG tube with biliary drainage..  Abdomen is generalized tenderness, firm.  No bowel  sounds.  Previous PEG tube insertion site with colostomy bag, minimal biliary material present. Ext: No edema.   Data Reviewed: I have personally reviewed following labs and imaging studies  CBC: Recent Labs  Lab 08/18/23 0510 08/20/23 0419  WBC 18.7* 12.9*  NEUTROABS 15.2* 9.9*  HGB 10.5* 9.0*  HCT 33.7* 29.6*  MCV 97.1 97.7  PLT 423* 323   Basic Metabolic Panel: Recent Labs  Lab 08/18/23 0510 08/19/23 0359 08/20/23 0419 08/21/23 0555 08/22/23 0802  NA 134* 138 141 139 135  K 4.9 3.9 3.1* 2.8* 3.2*  CL 108 116* 120* 112* 107  CO2 16* 16* 15* 18* 20*  GLUCOSE 234* 201* 188* 226* 205*  BUN 108* 112* 98* 89* 83*  CREATININE 1.45* 1.22* 1.16* 1.11* 1.15*  CALCIUM 11.7* 10.6* 10.8* 10.6* 10.1  MG 2.4 2.2 1.9 1.6* 2.1  PHOS 5.2* 3.7  --  1.7* 3.3   GFR: Estimated Creatinine Clearance: 43 mL/min (A) (by C-G formula based on SCr of 1.15 mg/dL (H)). Liver Function Tests: Recent Labs  Lab 08/18/23 0510 08/20/23 0419 08/21/23 0555  08/22/23 0802  AST 44* 40 56* 44*  ALT 96* 75* 93* 90*  ALKPHOS 589* 455* 464* 425*  BILITOT 0.5 0.5 0.6 0.6  PROT 8.6* 7.8 7.6 7.6  ALBUMIN 3.1* 2.8* 2.8* 2.8*   No results for input(s): "LIPASE", "AMYLASE" in the last 168 hours. No results for input(s): "AMMONIA" in the last 168 hours. Coagulation Profile: No results for input(s): "INR", "PROTIME" in the last 168 hours.  Cardiac Enzymes: No results for input(s): "CKTOTAL", "CKMB", "CKMBINDEX", "TROPONINI" in the last 168 hours. BNP (last 3 results) No results for input(s): "PROBNP" in the last 8760 hours. HbA1C: No results for input(s): "HGBA1C" in the last 72 hours. CBG: Recent Labs  Lab 08/21/23 1222 08/21/23 1827 08/22/23 0013 08/22/23 0527 08/22/23 1135  GLUCAP 186* 171* 168* 193* 192*   Lipid Profile: Recent Labs    08/21/23 0555 08/22/23 0802  TRIG 237* 288*    Thyroid Function Tests: No results for input(s): "TSH", "T4TOTAL", "FREET4", "T3FREE", "THYROIDAB" in the last 72 hours. Anemia Panel: No results for input(s): "VITAMINB12", "FOLATE", "FERRITIN", "TIBC", "IRON", "RETICCTPCT" in the last 72 hours. Sepsis Labs: No results for input(s): "PROCALCITON", "LATICACIDVEN" in the last 168 hours.   No results found for this or any previous visit (from the past 240 hour(s)).        Radiology Studies: No results found.      Scheduled Meds:  Chlorhexidine Gluconate Cloth  6 each Topical Daily   dorzolamide-timolol  1 drop Both Eyes BID   [START ON 08/23/2023] fluticasone  2 spray Each Nare Daily   insulin aspart  0-15 Units Subcutaneous Q6H   latanoprost  1 drop Both Eyes QHS   metoprolol tartrate  5 mg Intravenous Q6H   pantoprazole (PROTONIX) IV  40 mg Intravenous BID AC   sodium chloride flush  10 mL Intravenous Q12H   sodium chloride flush  10-40 mL Intracatheter Q12H   Continuous Infusions:  potassium chloride 10 mEq (08/22/23 1329)   TPN (CLINIMIX) Adult with Electrolyte Additives 52  mL/hr at 08/21/23 2319   TPN [facility]) Adult with Electrolyte Additives            Dorcas Carrow, MD [facility] 08/22/2023, 2:21 PM

## 2023-08-22 NOTE — Plan of Care (Signed)
  Problem: Clinical Measurements: Goal: Cardiovascular complication will be avoided Outcome: Progressing   Problem: Coping: Goal: Level of anxiety will decrease Outcome: Progressing   Problem: Safety: Goal: Ability to remain free from injury will improve Outcome: Progressing   

## 2023-08-22 NOTE — Progress Notes (Signed)
PHARMACY - TOTAL PARENTERAL NUTRITION CONSULT NOTE   Indication: Prolonged ileus  Patient Measurements: Height: 5\' 3"  (160 cm) Weight: 70.9 kg (156 lb 4.9 oz) IBW/kg (Calculated) : 52.4 TPN AdjBW (KG): 58.6 Body mass index is 27.69 kg/m.  Assessment:  [name] admitted 9/16 for nausea/vomiting x 2.5 weeks. Patient with h/o recurrent uterine cancer--Bx taken during 9/18 small bowel enteroscopy but no signs of malignant obstruction, negative for malignancy. Oncology recommended resumption of chemotherapy during this admission--carboplatin and paclitaxel, completed 9/25. GIB due to gastric ulcer/erosion at PEG tube site s/p reversal of PTA Xarelto with Kcentra, on PPI BID.  Patient has severe malnutrition given inability to keep food down with continued N/V during this admission and PTA. Refractory ileus from carcinomatosis causing delayed gastric motility.  Pharmacy consulted for TPN management.   Glucose / Insulin: H/o T2DM -BG goal 60-150. Range: 168-193 (12 units SSI/24 hrs)  Electrolytes: Na 135 (trending down), K 3.2, CO2 low but trending up to 20 (on bicarb drip), CorrCa 11; phos and Mag wnl - electrolytes removed from TPN 10/21  Renal: SCr 1.15, BUN 83  Hepatic: Alk Phos 425, AST/ALT remain elevated but trending down, Tbili WNL - Albumin 2.8 - TG : 413 (10/21), 131 (10/23), 237 (10/24), 288 (10/25)  Intake / Output; MIVF: - I/O:  + 369 mL - mIVF: NaBicarb in D5 @ 50 mL/hr  d/ced on 10/25 at ~0930 by Dr. Jerral Ralph - UOP: 900 mL - NG OP: 1500 mL - LBM: 10/19 stool x 1  GI Imaging: - 9/20 DG UGI: Gastric fold thickening as shown on prior CT and endoscopy. Mildly dilated duodenum, with sluggish flow of contrast through the duodenum--possibility of a more distal SBO is not excluded given this appearance. - 9/29 abd Xray: Slight increase in central small bowel dilatation.   GI Surgeries / Procedures:  - 9/18: small bowel endoscopy: Normal appearing, widely patent  esophagus and GEJ. Edematous gastric folds-biopsied. A single duodenal polyp in the post-bulbar duodenum-biopsied - 10/3:  gastrostomy tube placed for venting - 10/11: EGD: oozing gastric ulcer with active bleeding at the site of PEG tube insertion.  -10/12: PEG tube came out. NG tube placed 10/17 for stomach decompression, suction   Central access: port TPN start date:  9/26  Nutritional Goals:  Clinimix 8/10 at 52 ml/hr provides max protein 100g, and 828 KCal  Lipid target 25-30% of total kcal: 412-495 Kcal/day.  SMOF lipid 20% bag provides 500 kcal.    If using every day lipids: total 1482 kcal/day (90% of kcal goal), but currently will use intermittent lipids d/t previous hypertriglyceridemia:  MWF 1032kcal, TTSS 1482 kcal, daily average 1289 kcal (78% goal)    RD Assessment: Estimated Needs Total Energy Estimated Needs: 1650-1850 Total Protein Estimated Needs: 85-100g Total Fluid Estimated Needs: 1.8L/day  Current Nutrition:  TPN 9/26 >> NPO  Plan:  Due to the Baxter IV fluid disruption, all adult compounded parenteral nutrition will be made with premade Clinimix products +/- fat emulsion infusion. Our goal will be to continue providing as close to 100% of our patient's nutritional needs as possible.   Now:   - potassium chloride 10 mEq IV x4 runs   At 18:00:   Continue Clinimix 8/10 with Electrolyte additives at 52 ml/hr Add standard electrolytes, except remove Calcium  Add standard MVI and trace elements  SMOF Lipids over 12 hours 4 times per week (Tues, Thurs, Sat, Sun), monitor TG closely -- no SMOF Lipid on 1025  Continue q6h CBGs and moderate SSI. TPN labs Mon, Thurs. Check additional labs PRN. Check CMET, mag, phos and TG on 10/26 No further chemo. Patient not ready for hospice. F/u to determine long-term disposition.  Dorna Leitz, PharmD, BCPS 08/22/2023 7:37 AM

## 2023-08-22 NOTE — Progress Notes (Signed)
Tried to turn the patient but daughter refused. Placed her back on supine position.

## 2023-08-23 DIAGNOSIS — K567 Ileus, unspecified: Secondary | ICD-10-CM | POA: Diagnosis not present

## 2023-08-23 LAB — MAGNESIUM: Magnesium: 2.2 mg/dL (ref 1.7–2.4)

## 2023-08-23 LAB — COMPREHENSIVE METABOLIC PANEL
ALT: 88 U/L — ABNORMAL HIGH (ref 0–44)
AST: 41 U/L (ref 15–41)
Albumin: 2.9 g/dL — ABNORMAL LOW (ref 3.5–5.0)
Alkaline Phosphatase: 483 U/L — ABNORMAL HIGH (ref 38–126)
Anion gap: 8 (ref 5–15)
BUN: 84 mg/dL — ABNORMAL HIGH (ref 8–23)
CO2: 19 mmol/L — ABNORMAL LOW (ref 22–32)
Calcium: 10 mg/dL (ref 8.9–10.3)
Chloride: 108 mmol/L (ref 98–111)
Creatinine, Ser: 1.08 mg/dL — ABNORMAL HIGH (ref 0.44–1.00)
GFR, Estimated: 55 mL/min — ABNORMAL LOW (ref >60)
Glucose, Bld: 156 mg/dL — ABNORMAL HIGH (ref 70–99)
Potassium: 3.7 mmol/L (ref 3.5–5.1)
Sodium: 135 mmol/L (ref 135–145)
Total Bilirubin: 0.4 mg/dL (ref 0.3–1.2)
Total Protein: 8.1 g/dL (ref 6.5–8.1)

## 2023-08-23 LAB — GLUCOSE, CAPILLARY
Glucose-Capillary: 171 mg/dL — ABNORMAL HIGH (ref 70–99)
Glucose-Capillary: 177 mg/dL — ABNORMAL HIGH (ref 70–99)
Glucose-Capillary: 185 mg/dL — ABNORMAL HIGH (ref 70–99)
Glucose-Capillary: 200 mg/dL — ABNORMAL HIGH (ref 70–99)

## 2023-08-23 LAB — PHOSPHORUS: Phosphorus: 4.2 mg/dL (ref 2.5–4.6)

## 2023-08-23 LAB — TRIGLYCERIDES: Triglycerides: 191 mg/dL — ABNORMAL HIGH (ref <150)

## 2023-08-23 MED ORDER — TRACE MINERALS CU-MN-SE-ZN 300-55-60-3000 MCG/ML IV SOLN
INTRAVENOUS | Status: AC
  Filled 2023-08-23: qty 1248

## 2023-08-23 MED ORDER — LORAZEPAM 2 MG/ML IJ SOLN
1.0000 mg | INTRAMUSCULAR | Status: DC | PRN
  Administered 2023-08-23 – 2023-08-24 (×4): 1 mg via INTRAVENOUS
  Filled 2023-08-23 (×4): qty 1

## 2023-08-23 MED ORDER — SODIUM CHLORIDE 0.9 % IV SOLN
25.0000 mg | Freq: Once | INTRAVENOUS | Status: AC
  Administered 2023-08-23: 25 mg via INTRAVENOUS
  Filled 2023-08-23: qty 25

## 2023-08-23 MED ORDER — NYSTATIN 100000 UNIT/ML MT SUSP
5.0000 mL | Freq: Once | OROMUCOSAL | Status: AC
  Administered 2023-08-23: 500000 [IU] via ORAL
  Filled 2023-08-23: qty 5

## 2023-08-23 MED ORDER — PROMETHAZINE HCL 25 MG/ML IJ SOLN: 25.0000 mg | Freq: Once | INTRAVENOUS | Status: DC

## 2023-08-23 NOTE — Progress Notes (Signed)
PHARMACY - TOTAL PARENTERAL NUTRITION CONSULT NOTE   Indication: Prolonged ileus  Patient Measurements: Height: 5\' 3"  (160 cm) Weight: 69.5 kg (153 lb 3.5 oz) (70.7-1.2=69.5) IBW/kg (Calculated) : 52.4 TPN AdjBW (KG): 58.6 Body mass index is 27.14 kg/m.  Assessment:  [name] admitted 9/16 for nausea/vomiting x 2.5 weeks. Patient with h/o recurrent uterine cancer--Bx taken during 9/18 small bowel enteroscopy but no signs of malignant obstruction, negative for malignancy. Oncology recommended resumption of chemotherapy during this admission--carboplatin and paclitaxel, completed 9/25. GIB due to gastric ulcer/erosion at PEG tube site s/p reversal of PTA Xarelto with Kcentra, on PPI BID.  Patient has severe malnutrition given inability to keep food down with continued N/V during this admission and PTA. Refractory ileus from carcinomatosis causing delayed gastric motility.  Pharmacy consulted for TPN management.   Glucose / Insulin: H/o T2DM -BG goal <180. Range:156-185 (12 units SSI/24 hrs) Electrolytes: CorrCa (10.9) remains elevated - no Ca in TPN. -Other lytes WNL Renal: Scr 1.08, BUN remains elevated Hepatic: ALT remains elevated, Tbili WNL -Alk Phos continues to increase -TG (191) elevated, but improved after holding lipids 10/25 Intake / Output; MIVF: no mIVF, strict I/O not charted -UOP: 1045 mL + 1, NG: 1600 mL -LBM 10/25 GI Imaging: - 9/20 DG UGI: Gastric fold thickening as shown on prior CT and endoscopy. Mildly dilated duodenum, with sluggish flow of contrast through the duodenum--possibility of a more distal SBO is not excluded given this appearance. - 9/29 abd Xray: Slight increase in central small bowel dilatation.   GI Surgeries / Procedures:  - 9/18: small bowel endoscopy: Normal appearing, widely patent esophagus and GEJ. Edematous gastric folds-biopsied. A single duodenal polyp in the post-bulbar duodenum-biopsied - 10/3:  gastrostomy tube placed for venting -  10/11: EGD: oozing gastric ulcer with active bleeding at the site of PEG tube insertion.  -10/12: PEG tube came out. NG tube placed 10/17 for stomach decompression, suction   Central access: port TPN start date:  9/26  Nutritional Goals:  Clinimix 8/10 at 52 ml/hr provides max protein 100g, and 828 KCal  Lipid target 25-30% of total kcal: 412-495 Kcal/day.  SMOF lipid 20% bag provides 500 kcal.    If using every day lipids: total 1482 kcal/day (90% of kcal goal), but currently will use intermittent lipids d/t previous hypertriglyceridemia:  MWF 1032kcal, TTSS 1482 kcal, daily average 1289 kcal (78% goal)    RD Assessment: Estimated Needs Total Energy Estimated Needs: 1650-1850 Total Protein Estimated Needs: 85-100g Total Fluid Estimated Needs: 1.8L/day  Current Nutrition:  TPN 9/26 >> NPO except for ice chips  Plan:  Due to the Baxter IV fluid disruption, all adult compounded parenteral nutrition will be made with premade Clinimix products +/- fat emulsion infusion. Our goal will be to continue providing as close to 100% of our patient's nutritional needs as possible.  At 18:00:   Continue Clinimix 8/10 with Electrolyte additives at 52 ml/hr Standard electrolytes, except removed Calcium  Add standard MVI and trace elements  Continue to hold lipids due to hypertriglyceridemia Continue q6h CBGs and moderate SSI. TPN labs Mon, Thurs. Recheck K tomorrow AM.  Cindi Carbon, PharmD 08/23/23 10:41 AM

## 2023-08-23 NOTE — Plan of Care (Signed)
Discussed with patient and family plan of care for the evening, pain management and medications with some teach back displayed.  What is important to the patient control of her nausea and vomiting. Problem: Education: Goal: Knowledge of General Education information will improve Description: Including pain rating scale, medication(s)/side effects and non-pharmacologic comfort measures Outcome: Progressing   Problem: Health Behavior/Discharge Planning: Goal: Ability to manage health-related needs will improve Outcome: Progressing

## 2023-08-23 NOTE — Progress Notes (Signed)
PROGRESS NOTE    Sherri Gill  UVO:[id number] DOB: 05/30/1951 DOA: 07/13/2023 PCP: [name], MD   Brief Narrative:  72 year old female with stage IV uterine cancer status post TAH and LSO with peritoneal carcinomatosis was on active chemotherapy as an outpatient, persistent A-fib on Xarelto, diabetes mellitus type 2, hypertension presented with persistent nausea and vomiting with intolerance to oral intake.  Hospital course complicated with refractory ileus due to carcinomatosis requiring TPN.  She had PEG placed on 07/31/2023.  On 08/10/2023, patient started bleeding from PEG tube insertion site along with large amount of bloody vomitus.  She underwent EGD on 08/04/2023 which showed oozing gastric ulcer with active bleeding at the site of PEG tube insertion.  She returned to the ICU intubated, sedated.  On 08/09/2023, PEG tube came out; NG tube placed for stomach decompression and suction.  She was extubated on 08/10/2023.  Palliative care and oncology following.  She was made DNR after palliative care meeting on 08/11/2023.  She was transferred back to [facility] service from 08/13/2023 onwards.  Patient remains in the hospital.  She is in very poor clinical status.  Patient is comfort care and hospice appropriate, however patient herself is not willing to enroll into hospice at this time.  We are providing supportive care. See goal of care discussion 10/26 and separate documentation.  Assessment & Plan:   Acute upper GI bleeding due to gastric ulcer/erosion at PEG tube site Multiple procedures.  Currently no clinical evidence of bleeding. Cannot tolerate any anticoagulation. Continue Protonix twice daily IV.  Unable to take by mouth.  Acute respiratory failure postprocedure, resolved -Extubated on 08/10/2023. -On room air currently.  Minimal oxygen for support.  Refractory ileus Uterine cancer with peritoneal carcinomatosis At end-of-life Goals of care Generalized  deconditioning  -Additional goals of care discussion today, see documentation.  DNR with comfort care. -Currently on TPN as per pharmacy.  Continue NPO.  NG tube for drainage.   -Had venting gastrostomy tube placed on 07/31/2023 by IR.  Removed. -Patient is not willing to transitioning to hospice yet.   -Anticipating decompensation event anytime.  Hypothyroidism -Oral levothyroxine on hold.  Cannot take by mouth.  Paroxysmal A-fib -Currently mostly rate controlled.  Continue IV metoprolol.  Diltiazem on hold.  Xarelto on hold given risk for rebleeding  Anxiety -Off Precedex drip.  Currently on IV Ativan as needed and fentanyl.   Electrolytes including hypokalemia and hypomagnesemia: Pharmacy to manage with TPN.  Patient is NPO.  She cannot eat.  Constantly vomiting despite having NG tube in place. Patient can have Swiss and spit soda, hard candies, ice chips as much as she can do.  DVT prophylaxis: SCDs Code Status: DNR Family Communication: Daughter at the bedside. Disposition Plan: Status is: Inpatient Remains inpatient appropriate because: Severely ill.  On TPN. Since patient is not agreeable to palliation and hospice, not likely able to get out of the hospital.  Consultants: Oncology/palliative care/GI/PCCM/general surgery/IR  Procedures: As above  Antimicrobials:  Anti-infectives (From admission, onward)    Start     Dose/Rate Route Frequency Ordered Stop   08/09/23 1000  vancomycin (VANCOCIN) IVPB 1000 mg/200 mL premix  Status:  Discontinued        1,000 mg 200 mL/hr over 60 Minutes Intravenous Every 12 hours 08/04/2023 2132 08/10/23 0807   08/09/23 0600  ceFEPIme (MAXIPIME) 2 g in sodium chloride 0.9 % 100 mL IVPB  Status:  Discontinued        2 g 200  mL/hr over 30 Minutes Intravenous Every 8 hours 08/20/2023 2132 08/11/23 1132   07/31/2023 2230  vancomycin (VANCOREADY) IVPB 1750 mg/350 mL        1,750 mg 175 mL/hr over 120 Minutes Intravenous  Once 08/05/2023 2122  08/09/23 0117   08/05/2023 2215  vancomycin (VANCOCIN) IVPB 1000 mg/200 mL premix  Status:  Discontinued        1,000 mg 200 mL/hr over 60 Minutes Intravenous  Once 08/03/2023 2117 08/02/2023 2122   08/04/2023 2130  ceFEPIme (MAXIPIME) 2 g in sodium chloride 0.9 % 100 mL IVPB        2 g 200 mL/hr over 30 Minutes Intravenous NOW 08/27/2023 2117 08/10/2023 2309   07/31/23 1000  ceFAZolin (ANCEF) IVPB 2g/100 mL premix        2 g 200 mL/hr over 30 Minutes Intravenous On call 07/31/23 0912 07/31/23 1126   07/27/23 1800  micafungin (MYCAMINE) 100 mg in sodium chloride 0.9 % 100 mL IVPB  Status:  Discontinued        100 mg 105 mL/hr over 1 Hours Intravenous Every 24 hours 07/27/23 1718 08/01/23 1404        Subjective:  Patient seen and examined.  She responded appropriately.  She tells me she had some headache.  Denies any abdominal pain.  She had multiple episodes of vomiting overnight.  She is agreeable to medications to control her symptoms including vomiting.  Patient tells me that she is doing fine and she would like to continue the treatment we are doing.   Objective: Vitals:   08/23/23 0600 08/23/23 0608 08/23/23 0653 08/23/23 1319  BP:  (!) 116/59  134/70  Pulse:  (!) 105  98  Resp: 19 19 19    Temp:    99.4 F (37.4 C)  TempSrc:    Oral  SpO2:    97%  Weight:      Height:        Intake/Output Summary (Last 24 hours) at 08/23/2023 1349 Last data filed at 08/23/2023 0600 Gross per 24 hour  Intake 604.74 ml  Output 1645 ml  Net -1040.26 ml   Filed Weights   08/21/23 0500 08/22/23 0518 08/23/23 0500  Weight: 76.5 kg 70.9 kg 69.5 kg    Examination:  General: Sick looking.  Lethargic.  Mostly sleepy but wakes to conversation and keeps up some basic communication.  Appropriately answers questions. Cardiovascular: S1-S2 normal.   Respiratory: Bilateral poor air entry.  Poor inspiratory effort.   Gastrointestinal: NG tube with biliary drainage..  Abdomen is generalized tenderness,  firm.  No bowel sounds.  Previous PEG tube insertion site with colostomy bag, minimal biliary material present. Ext: No edema.   Data Reviewed: I have personally reviewed following labs and imaging studies  CBC: Recent Labs  Lab 08/18/23 0510 08/20/23 0419  WBC 18.7* 12.9*  NEUTROABS 15.2* 9.9*  HGB 10.5* 9.0*  HCT 33.7* 29.6*  MCV 97.1 97.7  PLT 423* 323   Basic Metabolic Panel: Recent Labs  Lab 08/18/23 0510 08/19/23 0359 08/20/23 0419 08/21/23 0555 08/22/23 0802 08/23/23 0138  NA 134* 138 141 139 135 135  K 4.9 3.9 3.1* 2.8* 3.2* 3.7  CL 108 116* 120* 112* 107 108  CO2 16* 16* 15* 18* 20* 19*  GLUCOSE 234* 201* 188* 226* 205* 156*  BUN 108* 112* 98* 89* 83* 84*  CREATININE 1.45* 1.22* 1.16* 1.11* 1.15* 1.08*  CALCIUM 11.7* 10.6* 10.8* 10.6* 10.1 10.0  MG 2.4 2.2 1.9 1.6* 2.1 2.2  PHOS 5.2* 3.7  --  1.7* 3.3 4.2   GFR: Estimated Creatinine Clearance: 45.3 mL/min (A) (by C-G formula based on SCr of 1.08 mg/dL (H)). Liver Function Tests: Recent Labs  Lab 08/18/23 0510 08/20/23 0419 08/21/23 0555 08/22/23 0802 08/23/23 0138  AST 44* 40 56* 44* 41  ALT 96* 75* 93* 90* 88*  ALKPHOS 589* 455* 464* 425* 483*  BILITOT 0.5 0.5 0.6 0.6 0.4  PROT 8.6* 7.8 7.6 7.6 8.1  ALBUMIN 3.1* 2.8* 2.8* 2.8* 2.9*   No results for input(s): "LIPASE", "AMYLASE" in the last 168 hours. No results for input(s): "AMMONIA" in the last 168 hours. Coagulation Profile: No results for input(s): "INR", "PROTIME" in the last 168 hours.  Cardiac Enzymes: No results for input(s): "CKTOTAL", "CKMB", "CKMBINDEX", "TROPONINI" in the last 168 hours. BNP (last 3 results) No results for input(s): "PROBNP" in the last 8760 hours. HbA1C: No results for input(s): "HGBA1C" in the last 72 hours. CBG: Recent Labs  Lab 08/22/23 1135 08/22/23 1803 08/22/23 2351 08/23/23 0607 08/23/23 1140  GLUCAP 192* 162* 158* 185* 171*   Lipid Profile: Recent Labs    08/22/23 0802 08/23/23 0138  TRIG  288* 191*    Thyroid Function Tests: No results for input(s): "TSH", "T4TOTAL", "FREET4", "T3FREE", "THYROIDAB" in the last 72 hours. Anemia Panel: No results for input(s): "VITAMINB12", "FOLATE", "FERRITIN", "TIBC", "IRON", "RETICCTPCT" in the last 72 hours. Sepsis Labs: No results for input(s): "PROCALCITON", "LATICACIDVEN" in the last 168 hours.   No results found for this or any previous visit (from the past 240 hour(s)).        Radiology Studies: No results found.      Scheduled Meds:  Chlorhexidine Gluconate Cloth  6 each Topical Daily   dorzolamide-timolol  1 drop Both Eyes BID   fluticasone  2 spray Each Nare Daily   insulin aspart  0-15 Units Subcutaneous Q6H   latanoprost  1 drop Both Eyes QHS   metoprolol tartrate  5 mg Intravenous Q6H   pantoprazole (PROTONIX) IV  40 mg Intravenous BID AC   sodium chloride flush  10 mL Intravenous Q12H   sodium chloride flush  10-40 mL Intracatheter Q12H   Continuous Infusions:  TPN (CLINIMIX) Adult with Electrolyte Additives 52 mL/hr at 08/22/23 2330   TPN [facility]) Adult with Electrolyte Additives            Dorcas Carrow, MD [facility] 08/23/2023, 1:49 PM

## 2023-08-23 NOTE — IPAL (Signed)
  Interdisciplinary Goals of Care Family Meeting   Date carried out: 08/23/2023  Location of the meeting: Bedside  Member's involved: Physician, Bedside Registered Nurse, Family Member or next of kin, and Other: patient   Durable Power of Attorney or acting medical decision maker: Cardell Peach     Discussion: We discussed goals of care for Sherri Gill .  Patient remains in very poor clinical status.  I asked patient what does she think about her current medical management and outcome including pain nausea vomiting and stress.  Patient tells me that she is doing "fine and would like to to continue to try to do well".  I discussed with patient that things have become very difficult and she tells me she understands.  Case discussed with patient's daughter.  Ms. Zacarias Pontes is aware about mom's poor prognosis and most of the things that we are doing now is not helping her.  She has been becoming more debilitated, uncontrolled symptoms and vomiting frequently.  Family understands that patient is nearing death.  As patient is not willing to accept comfort care and hospice, family would like to support her decision until she is able to make her own decisions. We clarified our goal of care that if any of her treatment including TPN shows any complications we will stop it. If patient has any decompensation, altered mental status, unstable vitals we will treat her with comfort care. Patient was DNR with full intervention, changed to DNR with comfort care.  We will be able to use more symptom control medications including higher doses of nausea medicines and pain medications to help her stay comfortable.  Code status:   Code Status: Do not attempt resuscitation (DNR) - Comfort care   Disposition: Continue current acute care  Time spent for the meeting: 30 minutes    Dorcas Carrow, MD  08/23/2023, 1:37 PM

## 2023-08-23 NOTE — Progress Notes (Signed)
Patient had vomiting x 1 episode which decreased after first dose but nausea was persistent throughout the night.  Patient had to receive several doses of nausea medications and anti-anxiety medications throughout the shift.

## 2023-08-24 DIAGNOSIS — K567 Ileus, unspecified: Secondary | ICD-10-CM | POA: Diagnosis not present

## 2023-08-24 LAB — BASIC METABOLIC PANEL
Anion gap: 11 (ref 5–15)
BUN: 99 mg/dL — ABNORMAL HIGH (ref 8–23)
CO2: 19 mmol/L — ABNORMAL LOW (ref 22–32)
Calcium: 11 mg/dL — ABNORMAL HIGH (ref 8.9–10.3)
Chloride: 111 mmol/L (ref 98–111)
Creatinine, Ser: 1.49 mg/dL — ABNORMAL HIGH (ref 0.44–1.00)
GFR, Estimated: 38 mL/min — ABNORMAL LOW (ref >60)
Glucose, Bld: 180 mg/dL — ABNORMAL HIGH (ref 70–99)
Potassium: 4.2 mmol/L (ref 3.5–5.1)
Sodium: 141 mmol/L (ref 135–145)

## 2023-08-24 LAB — GLUCOSE, CAPILLARY
Glucose-Capillary: 206 mg/dL — ABNORMAL HIGH (ref 70–99)
Glucose-Capillary: 191 mg/dL — ABNORMAL HIGH (ref 70–99)
Glucose-Capillary: 187 mg/dL — ABNORMAL HIGH (ref 70–99)
Glucose-Capillary: 185 mg/dL — ABNORMAL HIGH (ref 70–99)

## 2023-08-24 MED ORDER — TRACE MINERALS CU-MN-SE-ZN 300-55-60-3000 MCG/ML IV SOLN
INTRAVENOUS | Status: DC
Start: 2023-08-24 — End: 2023-08-25
  Filled 2023-08-24: qty 1248

## 2023-08-24 MED ORDER — INSULIN ASPART 100 UNIT/ML IJ SOLN
0.0000 [IU] | Freq: Four times a day (QID) | INTRAMUSCULAR | Status: DC
  Administered 2023-08-24 (×3): 4 [IU] via SUBCUTANEOUS
  Administered 2023-08-25: 7 [IU] via SUBCUTANEOUS

## 2023-08-24 NOTE — Progress Notes (Signed)
PHARMACY - TOTAL PARENTERAL NUTRITION CONSULT NOTE   Indication: Prolonged ileus  Patient Measurements: Height: 5\' 3"  (160 cm) Weight: 70.7 kg (155 lb 14.4 oz) IBW/kg (Calculated) : 52.4 TPN AdjBW (KG): 58.6 Body mass index is 27.62 kg/m.  Assessment:  [name] admitted 9/16 for nausea/vomiting x 2.5 weeks. Patient with h/o recurrent uterine cancer--Bx taken during 9/18 small bowel enteroscopy but no signs of malignant obstruction, negative for malignancy. Oncology recommended resumption of chemotherapy during this admission--carboplatin and paclitaxel, completed 9/25. GIB due to gastric ulcer/erosion at PEG tube site s/p reversal of PTA Xarelto with Kcentra, on PPI BID.  Patient has severe malnutrition given inability to keep food down with continued N/V during this admission and PTA. Refractory ileus from carcinomatosis causing delayed gastric motility.  Recommendations have been made for comfort measures/hospice, but pt has declined. Pharmacy consulted for TPN management.   Glucose / Insulin: H/o T2DM -BG goal <180. Range:171-206. (42 units SSI/24 hrs) Electrolytes: CorrCa (11.9) remains elevated and increased. Calcium has been removed from TPN. Other lytes WNL. -Bicarb remains low Renal: Bump in both SCr (1.49) and BUN (99) today Hepatic: ALT remains elevated, Tbili WNL -Alk Phos continues to increase -TG (191) elevated, but improved after holding lipids 10/25 Intake / Output; MIVF: no mIVF -UOP: 350 mL (significant decrease), NG: 1000 mL -LBM 10/25 GI Imaging: - 9/20 DG UGI: Gastric fold thickening as shown on prior CT and endoscopy. Mildly dilated duodenum, with sluggish flow of contrast through the duodenum--possibility of a more distal SBO is not excluded given this appearance. - 9/29 abd Xray: Slight increase in central small bowel dilatation.   GI Surgeries / Procedures:  - 9/18: small bowel endoscopy: Normal appearing, widely patent esophagus and GEJ. Edematous gastric  folds-biopsied. A single duodenal polyp in the post-bulbar duodenum-biopsied - 10/3:  gastrostomy tube placed for venting - 10/11: EGD: oozing gastric ulcer with active bleeding at the site of PEG tube insertion.  -10/12: PEG tube came out. NG tube placed 10/17 for stomach decompression, suction   Central access: port TPN start date:  9/26  Nutritional Goals:  Clinimix 8/10 at 52 ml/hr provides max protein 100g, and 828 KCal  Lipid target 25-30% of total kcal: 412-495 Kcal/day.  SMOF lipid 20% bag provides 500 kcal.    If using every day lipids: total 1482 kcal/day (90% of kcal goal), but using intermittent lipids d/t hypertriglyceridemia:   RD Assessment: Estimated Needs Total Energy Estimated Needs: 1650-1850 Total Protein Estimated Needs: 85-100g Total Fluid Estimated Needs: 1.8L/day  Current Nutrition:  TPN 9/26 >> NPO except for ice chips  Plan:  Due to the Baxter IV fluid disruption, all adult compounded parenteral nutrition will be made with premade Clinimix products +/- fat emulsion infusion. Our goal will be to continue providing as close to 100% of our patient's nutritional needs as possible.  Now: -Increase SSI from moderate to severe given hyperglycemia  At 18:00:   Continue Clinimix 8/10 with Electrolyte additives at 52 mL/hr Standard electrolytes, except removed Calcium  Add standard MVI and trace elements  Continue to hold lipids due to hypertriglyceridemia Continue q6h CBGs and resistant SSI TPN labs Mon, Thurs.  Cindi Carbon, PharmD 08/24/23 10:45 AM

## 2023-08-24 NOTE — Progress Notes (Signed)
PROGRESS NOTE    Sherri Gill  NAT:[id number] DOB: [birth year] DOA: 07/22/2023 PCP: [name], MD   Brief Narrative:  72 year old female with stage IV uterine cancer status post TAH and LSO with peritoneal carcinomatosis was on active chemotherapy as an outpatient, persistent A-fib on Xarelto, diabetes mellitus type 2, hypertension presented with persistent nausea and vomiting with intolerance to oral intake.  Hospital course complicated with refractory ileus due to carcinomatosis requiring TPN.   She had venting PEG placed on 07/31/2023.  On 08/03/2023, patient started bleeding from PEG tube insertion site along with large amount of bloody vomitus.  She underwent EGD on 08/20/2023 which showed oozing gastric ulcer with active bleeding at the site of PEG tube insertion.  She returned to the ICU intubated, sedated.   On 08/09/2023, PEG tube came out; NG tube placed for stomach decompression and suction.  She was extubated on 08/10/2023.  Palliative care and oncology, surgery were following.  All signed off.  She was made DNR after palliative care meeting on 08/11/2023.  She was transferred back to [facility] service from 08/13/2023 onwards.  Patient remains in the hospital.  She is in very poor clinical status.  Patient is comfort care and hospice appropriate, however patient herself was not willing to enroll into hospice at this time.  We are providing supportive care. See goal of care discussion 10/26 and separate documentation.   Subjective:  Patient seen and examined.  Her friend was at the bedside.  Her daughter had just left.  Patient was medicated for nausea and pain.  Patient was not able to participate.  She voiced doing okay.  Tachycardic. Currently on fentanyl 50 mcg every 2 hours and she has been mostly using around-the-clock. Patient looks very sick, unable to participate much in conversation today.   Assessment & Plan:   Acute upper GI bleeding due to gastric  ulcer/erosion at PEG tube site, currently no evidence of active bleeding. Refractory ileus, intractable nausea and absence of bowel function. Unable to eat due to intractable nausea and vomiting, currently n.p.o. and on TPN. Uterine cancer with peritoneal carcinomatosis, exhausted treatment.  At end-of-life.   Continue Protonix twice daily IV.  Unable to take by mouth. On room air currently.  Minimal oxygen for support. Additional goals of care discussions 10/26. DNR with comfort care. Currently on TPN as per pharmacy.  Continue NPO.  NG tube for drainage.   Patient is not willing to transitioning to hospice yet.   Anticipating decompensation event.  Anticipating hospital death.  DVT prophylaxis: SCDs Code Status: DNR Family Communication: Friend at the bedside. Disposition Plan: Status is: Inpatient Remains inpatient appropriate because: Severely ill.  On TPN. Since patient is not agreeable to palliation and hospice, not likely able to get out of the hospital.  Anticipate hospital death.  Consultants: Oncology/palliative care/GI/PCCM/general surgery/IR  Procedures: As above  Antimicrobials:  Anti-infectives (From admission, onward)    Start     Dose/Rate Route Frequency Ordered Stop   08/09/23 1000  vancomycin (VANCOCIN) IVPB 1000 mg/200 mL premix  Status:  Discontinued        1,000 mg 200 mL/hr over 60 Minutes Intravenous Every 12 hours 08/23/2023 2132 08/10/23 0807   08/09/23 0600  ceFEPIme (MAXIPIME) 2 g in sodium chloride 0.9 % 100 mL IVPB  Status:  Discontinued        2 g 200 mL/hr over 30 Minutes Intravenous Every 8 hours 07/29/2023 2132 08/11/23 1132   07/30/2023 2230  vancomycin (VANCOREADY)  IVPB 1750 mg/350 mL        1,750 mg 175 mL/hr over 120 Minutes Intravenous  Once 08/07/2023 2122 08/09/23 0117   07/29/2023 2215  vancomycin (VANCOCIN) IVPB 1000 mg/200 mL premix  Status:  Discontinued        1,000 mg 200 mL/hr over 60 Minutes Intravenous  Once 08/19/2023 2117 07/30/2023 2122    08/24/2023 2130  ceFEPIme (MAXIPIME) 2 g in sodium chloride 0.9 % 100 mL IVPB        2 g 200 mL/hr over 30 Minutes Intravenous NOW 08/17/2023 2117 07/31/2023 2309   07/31/23 1000  ceFAZolin (ANCEF) IVPB 2g/100 mL premix        2 g 200 mL/hr over 30 Minutes Intravenous On call 07/31/23 0912 07/31/23 1126   07/27/23 1800  micafungin (MYCAMINE) 100 mg in sodium chloride 0.9 % 100 mL IVPB  Status:  Discontinued        100 mg 105 mL/hr over 1 Hours Intravenous Every 24 hours 07/27/23 1718 08/01/23 1404           Objective: Vitals:   08/23/23 2228 08/24/23 0013 08/24/23 0543 08/24/23 0928  BP: (!) 113/58 130/70 124/77 121/71  Pulse: (!) 108  (!) 113 (!) 110  Resp:   (!) 24 19  Temp: 98.2 F (36.8 C)  97.8 F (36.6 C) 99.4 F (37.4 C)  TempSrc: Axillary  Oral Oral  SpO2: 100%  100% 97%  Weight:   70.7 kg   Height:        Intake/Output Summary (Last 24 hours) at 08/24/2023 1211 Last data filed at 08/24/2023 0500 Gross per 24 hour  Intake 483.34 ml  Output 1350 ml  Net -866.66 ml   Filed Weights   08/22/23 0518 08/23/23 0500 08/24/23 0543  Weight: 70.9 kg 69.5 kg 70.7 kg    Examination:  General: Sick looking.  Lethargic.  Unable to keep up conversation. Cardiovascular: S1-S2 normal.  Tachycardic. Respiratory: Bilateral poor air entry.  Poor inspiratory efforts. Gastrointestinal: Soft.  Mild diffuse tenderness.  Ostomy site with biliary drain. Ext: Trace pedal edema. Neuro: Sleepy.  Lethargic.  Unable to participate in any conversation.  Not following commands. NG tube with biliary drain Pure wick with clear urine     Data Reviewed: I have personally reviewed following labs and imaging studies  CBC: Recent Labs  Lab 08/18/23 0510 08/20/23 0419  WBC 18.7* 12.9*  NEUTROABS 15.2* 9.9*  HGB 10.5* 9.0*  HCT 33.7* 29.6*  MCV 97.1 97.7  PLT 423* 323   Basic Metabolic Panel: Recent Labs  Lab 08/18/23 0510 08/19/23 0359 08/20/23 0419 08/21/23 0555  08/22/23 0802 08/23/23 0138 08/24/23 0303  NA 134* 138 141 139 135 135 141  K 4.9 3.9 3.1* 2.8* 3.2* 3.7 4.2  CL 108 116* 120* 112* 107 108 111  CO2 16* 16* 15* 18* 20* 19* 19*  GLUCOSE 234* 201* 188* 226* 205* 156* 180*  BUN 108* 112* 98* 89* 83* 84* 99*  CREATININE 1.45* 1.22* 1.16* 1.11* 1.15* 1.08* 1.49*  CALCIUM 11.7* 10.6* 10.8* 10.6* 10.1 10.0 11.0*  MG 2.4 2.2 1.9 1.6* 2.1 2.2  --   PHOS 5.2* 3.7  --  1.7* 3.3 4.2  --    GFR: Estimated Creatinine Clearance: 33.1 mL/min (A) (by C-G formula based on SCr of 1.49 mg/dL (H)). Liver Function Tests: Recent Labs  Lab 08/18/23 0510 08/20/23 0419 08/21/23 0555 08/22/23 0802 08/23/23 0138  AST 44* 40 56* 44* 41  ALT 96* 75*  93* 90* 88*  ALKPHOS 589* 455* 464* 425* 483*  BILITOT 0.5 0.5 0.6 0.6 0.4  PROT 8.6* 7.8 7.6 7.6 8.1  ALBUMIN 3.1* 2.8* 2.8* 2.8* 2.9*   No results for input(s): "LIPASE", "AMYLASE" in the last 168 hours. No results for input(s): "AMMONIA" in the last 168 hours. Coagulation Profile: No results for input(s): "INR", "PROTIME" in the last 168 hours.  Cardiac Enzymes: No results for input(s): "CKTOTAL", "CKMB", "CKMBINDEX", "TROPONINI" in the last 168 hours. BNP (last 3 results) No results for input(s): "PROBNP" in the last 8760 hours. HbA1C: No results for input(s): "HGBA1C" in the last 72 hours. CBG: Recent Labs  Lab 08/23/23 0607 08/23/23 1140 08/23/23 1720 08/23/23 2352 08/24/23 0546  GLUCAP 185* 171* 177* 200* 206*   Lipid Profile: Recent Labs    08/22/23 0802 08/23/23 0138  TRIG 288* 191*    Thyroid Function Tests: No results for input(s): "TSH", "T4TOTAL", "FREET4", "T3FREE", "THYROIDAB" in the last 72 hours. Anemia Panel: No results for input(s): "VITAMINB12", "FOLATE", "FERRITIN", "TIBC", "IRON", "RETICCTPCT" in the last 72 hours. Sepsis Labs: No results for input(s): "PROCALCITON", "LATICACIDVEN" in the last 168 hours.   No results found for this or any previous visit (from  the past 240 hour(s)).        Radiology Studies: No results found.      Scheduled Meds:  Chlorhexidine Gluconate Cloth  6 each Topical Daily   dorzolamide-timolol  1 drop Both Eyes BID   fluticasone  2 spray Each Nare Daily   insulin aspart  0-20 Units Subcutaneous Q6H   latanoprost  1 drop Both Eyes QHS   metoprolol tartrate  5 mg Intravenous Q6H   pantoprazole (PROTONIX) IV  40 mg Intravenous BID AC   sodium chloride flush  10 mL Intravenous Q12H   sodium chloride flush  10-40 mL Intracatheter Q12H   Continuous Infusions:  TPN (CLINIMIX) Adult with Electrolyte Additives 52 mL/hr at 08/23/23 1734   TPN [facility]) Adult with Electrolyte Additives            Dorcas Carrow, MD [facility] 08/24/2023, 12:11 PM

## 2023-08-24 NOTE — Progress Notes (Signed)
   08/24/23 0543  Assess: MEWS Score  Temp 97.8 F (36.6 C)  BP 124/77  MAP (mmHg) 91  Pulse Rate (!) 113  ECG Heart Rate (!) 113  Resp (!) 24  SpO2 100 %  Assess: MEWS Score  MEWS Temp 0  MEWS Systolic 0  MEWS Pulse 2  MEWS RR 1  MEWS LOC 0  MEWS Score 3  MEWS Score Color Yellow  Assess: if the MEWS score is Yellow or Red  Were vital signs accurate and taken at a resting state? Yes  Does the patient meet 2 or more of the SIRS criteria? Yes  Does the patient have a confirmed or suspected source of infection? No  MEWS guidelines implemented  Yes, yellow  Treat  MEWS Interventions Considered administering scheduled or prn medications/treatments as ordered  Take Vital Signs  Increase Vital Sign Frequency  Yellow: Q2hr x1, continue Q4hrs until patient remains green for 12hrs  Escalate  MEWS: Escalate Yellow: Discuss with charge nurse and consider notifying provider and/or RRT  Notify: Charge Nurse/RN  Name of Charge Nurse/RN Notified Sharion Settler., RN  Provider Notification  Provider Name/Title Chinita Greenland, NP  Date Provider Notified 08/24/23  Time Provider Notified [phone]  Method of Notification Page (secure chat)  Notification Reason Other (Comment) (yellow MEWS)  Provider response No new orders  Assess: SIRS CRITERIA  SIRS Temperature  0  SIRS Pulse 1  SIRS Respirations  1  SIRS WBC 0  SIRS Score Sum  2   Notify CN and hospitalist regarding yellow MEWS. Will continue to monitor.

## 2023-08-25 DIAGNOSIS — K567 Ileus, unspecified: Secondary | ICD-10-CM | POA: Diagnosis not present

## 2023-08-25 LAB — CBC WITH DIFFERENTIAL/PLATELET
Abs Immature Granulocytes: 0.34 10*3/uL — ABNORMAL HIGH (ref 0.00–0.07)
Basophils Absolute: 0.1 10*3/uL (ref 0.0–0.1)
Basophils Relative: 1 %
Eosinophils Absolute: 0.1 10*3/uL (ref 0.0–0.5)
Eosinophils Relative: 1 %
HCT: 32.9 % — ABNORMAL LOW (ref 36.0–46.0)
Hemoglobin: 10.2 g/dL — ABNORMAL LOW (ref 12.0–15.0)
Immature Granulocytes: 2 %
Lymphocytes Relative: 13 %
Lymphs Abs: 1.8 10*3/uL (ref 0.7–4.0)
MCH: 30 pg (ref 26.0–34.0)
MCHC: 31 g/dL (ref 30.0–36.0)
MCV: 96.8 fL (ref 80.0–100.0)
Monocytes Absolute: 2.6 10*3/uL — ABNORMAL HIGH (ref 0.1–1.0)
Monocytes Relative: 18 %
Neutro Abs: 9.5 10*3/uL — ABNORMAL HIGH (ref 1.7–7.7)
Neutrophils Relative %: 65 %
Platelets: 350 10*3/uL (ref 150–400)
RBC: 3.4 MIL/uL — ABNORMAL LOW (ref 3.87–5.11)
RDW: 16.1 % — ABNORMAL HIGH (ref 11.5–15.5)
WBC: 14.4 10*3/uL — ABNORMAL HIGH (ref 4.0–10.5)
nRBC: 0.4 % — ABNORMAL HIGH (ref 0.0–0.2)

## 2023-08-25 LAB — TRIGLYCERIDES: Triglycerides: 133 mg/dL (ref <150)

## 2023-08-25 LAB — COMPREHENSIVE METABOLIC PANEL
ALT: 80 U/L — ABNORMAL HIGH (ref 0–44)
AST: 38 U/L (ref 15–41)
Albumin: 3.3 g/dL — ABNORMAL LOW (ref 3.5–5.0)
Alkaline Phosphatase: 548 U/L — ABNORMAL HIGH (ref 38–126)
Anion gap: 11 (ref 5–15)
BUN: 99 mg/dL — ABNORMAL HIGH (ref 8–23)
CO2: 19 mmol/L — ABNORMAL LOW (ref 22–32)
Calcium: 11 mg/dL — ABNORMAL HIGH (ref 8.9–10.3)
Chloride: 110 mmol/L (ref 98–111)
Creatinine, Ser: 1.76 mg/dL — ABNORMAL HIGH (ref 0.44–1.00)
GFR, Estimated: 31 mL/min — ABNORMAL LOW (ref >60)
Glucose, Bld: 186 mg/dL — ABNORMAL HIGH (ref 70–99)
Potassium: 4.3 mmol/L (ref 3.5–5.1)
Sodium: 140 mmol/L (ref 135–145)
Total Bilirubin: 0.5 mg/dL (ref 0.3–1.2)
Total Protein: 8.7 g/dL — ABNORMAL HIGH (ref 6.5–8.1)

## 2023-08-25 LAB — PHOSPHORUS: Phosphorus: 5.8 mg/dL — ABNORMAL HIGH (ref 2.5–4.6)

## 2023-08-25 LAB — MAGNESIUM: Magnesium: 2.8 mg/dL — ABNORMAL HIGH (ref 1.7–2.4)

## 2023-08-25 LAB — GLUCOSE, CAPILLARY
Glucose-Capillary: 207 mg/dL — ABNORMAL HIGH (ref 70–99)
Glucose-Capillary: 189 mg/dL — ABNORMAL HIGH (ref 70–99)

## 2023-08-25 MED ORDER — DIPHENHYDRAMINE HCL 50 MG/ML IJ SOLN: 12.5000 mg | INTRAMUSCULAR | Status: DC | PRN

## 2023-08-25 MED ORDER — PROCHLORPERAZINE EDISYLATE 10 MG/2ML IJ SOLN
10.0000 mg | INTRAMUSCULAR | Status: DC | PRN
  Administered 2023-08-25: 10 mg via INTRAVENOUS
  Filled 2023-08-25: qty 2

## 2023-08-25 MED ORDER — TRACE MINERALS CU-MN-SE-ZN 300-55-60-3000 MCG/ML IV SOLN
INTRAVENOUS | Status: DC
  Filled 2023-08-25: qty 1248

## 2023-08-25 MED ORDER — FENTANYL 2500MCG IN NS 250ML (10MCG/ML) PREMIX INFUSION
25.0000 ug/h | INTRAVENOUS | Status: DC
Start: 2023-08-25 — End: 2023-08-27
  Administered 2023-08-25: 25 ug/h via INTRAVENOUS
  Administered 2023-08-26: 175 ug/h via INTRAVENOUS
  Filled 2023-08-25 (×2): qty 250

## 2023-08-25 MED ORDER — GLYCOPYRROLATE 0.2 MG/ML IJ SOLN
0.2000 mg | INTRAMUSCULAR | Status: DC | PRN
  Filled 2023-08-25: qty 1

## 2023-08-25 MED ORDER — LORAZEPAM 2 MG/ML IJ SOLN
1.0000 mg | INTRAMUSCULAR | Status: DC | PRN
  Administered 2023-08-26: 1 mg via INTRAVENOUS
  Filled 2023-08-25: qty 1

## 2023-08-25 MED ORDER — GLYCOPYRROLATE 0.2 MG/ML IJ SOLN
0.2000 mg | INTRAMUSCULAR | Status: DC | PRN
Start: 2023-08-25 — End: 2023-08-26
  Administered 2023-08-26 (×2): 0.2 mg via INTRAVENOUS
  Filled 2023-08-25: qty 1

## 2023-08-25 MED ORDER — GLYCOPYRROLATE 1 MG PO TABS: 1.0000 mg | ORAL_TABLET | ORAL | Status: DC | PRN

## 2023-08-25 MED ORDER — LORAZEPAM 1 MG PO TABS: 1.0000 mg | ORAL_TABLET | ORAL | Status: DC | PRN

## 2023-08-25 MED ORDER — ACETAMINOPHEN 650 MG RE SUPP: 650.0000 mg | Freq: Four times a day (QID) | RECTAL | Status: DC | PRN

## 2023-08-25 MED ORDER — FAT EMUL FISH OIL/PLANT BASED 20% (SMOFLIPID)IV EMUL
250.0000 mL | INTRAVENOUS | Status: DC
  Filled 2023-08-25: qty 250

## 2023-08-25 MED ORDER — FENTANYL BOLUS VIA INFUSION
25.0000 ug | INTRAVENOUS | Status: DC | PRN
  Administered 2023-08-25 – 2023-08-26 (×10): 25 ug via INTRAVENOUS

## 2023-08-25 MED ORDER — ACETAMINOPHEN 325 MG PO TABS: 650.0000 mg | ORAL_TABLET | Freq: Four times a day (QID) | ORAL | Status: DC | PRN

## 2023-08-25 MED ORDER — LORAZEPAM 2 MG/ML PO CONC: 1.0000 mg | ORAL | Status: DC | PRN

## 2023-08-25 NOTE — Progress Notes (Addendum)
PHARMACY - TOTAL PARENTERAL NUTRITION CONSULT NOTE   Indication: Prolonged ileus  Patient Measurements: Height: 5\' 3"  (160 cm) Weight: 67 kg (147 lb 9.6 oz) IBW/kg (Calculated) : 52.4 TPN AdjBW (KG): 58.6 Body mass index is 26.15 kg/m.  Assessment:  [name] admitted 9/16 for nausea/vomiting x 2.5 weeks. Patient with h/o recurrent uterine cancer--Bx taken during 9/18 small bowel enteroscopy but no signs of malignant obstruction, negative for malignancy. Oncology recommended resumption of chemotherapy during this admission--carboplatin and paclitaxel, completed 9/25. GIB due to gastric ulcer/erosion at PEG tube site s/p reversal of PTA Xarelto with Kcentra, on PPI BID.  Patient has severe malnutrition given inability to keep food down with continued N/V during this admission and PTA. Refractory ileus from carcinomatosis causing delayed gastric motility.  Recommendations have been made for comfort measures/hospice, but pt has declined. Pharmacy consulted for TPN management.   Glucose / Insulin: H/o T2DM -  on rSSI q6h (used 19 units in the past 24 hrs) - CBGs (goal 60-150): 185-207 Electrolytes: Phos elevated at 5.8, Mag up 2.8, CorrCa 11.56 (Calcium has been removed from TPN), CO2 slightly low at 19; Other lytes WNL. Renal: SCr trending up to 1.76 (crcl ~27) and BUN elevated at 99 Hepatic: ALT remains elevated but trending down, Tbili WNL -Alk Phos continues to increase -TG improved to 133 after holding lipids  since 10/25 Intake / Output; MIVF: no mIVF - I/O: - 1295 mL -UOP: 300 mL  - NG: 1475 mL - LBM 10/25 GI Imaging: - 9/20 DG UGI: Gastric fold thickening as shown on prior CT and endoscopy. Mildly dilated duodenum, with sluggish flow of contrast through the duodenum--possibility of a more distal SBO is not excluded given this appearance. - 9/29 abd Xray: Slight increase in central small bowel dilatation.   GI Surgeries / Procedures:  - 9/18: small bowel endoscopy: Normal  appearing, widely patent esophagus and GEJ. Edematous gastric folds-biopsied. A single duodenal polyp in the post-bulbar duodenum-biopsied - 10/3:  gastrostomy tube placed for venting - 10/11: EGD: oozing gastric ulcer with active bleeding at the site of PEG tube insertion.  -10/12: PEG tube came out. NG tube placed 10/17 for stomach decompression, suction   Central access: port TPN start date:  9/26  Nutritional Goals:  Clinimix 8/10 at 52 ml/hr provides max protein 100g, and 828 KCal  Lipid target 25-30% of total kcal: 412-495 Kcal/day.  SMOF lipid 20% bag provides 500 kcal.    If using every day lipids: total 1482 kcal/day (90% of kcal goal), but using intermittent lipids d/t hypertriglyceridemia:   RD Assessment: Estimated Needs Total Energy Estimated Needs: 1650-1850 Total Protein Estimated Needs: 85-100g Total Fluid Estimated Needs: 1.8L/day  Current Nutrition:  TPN 9/26 >> NPO except for ice chips  Plan:  Due to the [employer] IV fluid disruption, all adult compounded parenteral nutrition will be made with premade Clinimix products +/- fat emulsion infusion. Our goal will be to continue providing as close to 100% of our patient's nutritional needs as possible.   At 18:00:   Continue Clinimix 8/10 with Electrolyte additives at 52 mL/hr Standard electrolytes, except removed Calcium , phosphate and magnesium Add standard MVI and trace elements  Add 10 units of insulin to TPN bag  Resume SMOFLIPID back today  Continue q6h CBGs and resistant SSI TPN labs Mon, Thurs. Cmet, phos, mag and TG on   Lucia Gaskins, PharmD 08/25/23 7:43 AM  ____________________________________  Adden: Per MD, D/c TPN today - will d/c  TPN when current bag runs out at 6p today. Dr. Jerral Ralph is agreeable to this plan.  - pharmacy will sign off for TPN  Dorna Leitz, PharmD, BCPS 08/25/2023 9:35 AM

## 2023-08-25 NOTE — Progress Notes (Signed)
Nutrition Brief Note  Chart reviewed. Pt now transitioning to comfort care.  No further nutrition interventions planned at this time.   Acea Yagi, MS, RD, LDN Inpatient Clinical Dietitian Contact information available via Amion   

## 2023-08-25 NOTE — IPAL (Signed)
  Interdisciplinary Goals of Care Family Meeting   Date carried out: 08/25/2023  Location of the meeting: Bedside  Member's involved: Physician, Bedside Registered Nurse, Social Worker, and Family Member or next of kin  Durable Power of Attorney or acting medical decision maker: Ms Zacarias Pontes , daughter     Discussion: We discussed goals of care for [employer] .  See previous documentation in progress note . Now at end of life .   Code status:   Code Status: Do not attempt resuscitation (DNR) - Comfort care   Disposition: In-patient comfort care  Time spent for the meeting: 35 minutes     Dorcas Carrow, MD  08/25/2023, 9:30 AM

## 2023-08-25 NOTE — Plan of Care (Signed)
  Problem: Activity: Goal: Risk for activity intolerance will decrease Outcome: Not Progressing   Problem: Pain Managment: Goal: General experience of comfort will improve Outcome: Progressing   Problem: Skin Integrity: Goal: Risk for impaired skin integrity will decrease Outcome: Progressing

## 2023-08-25 NOTE — Plan of Care (Signed)
  Problem: Pain Managment: Goal: General experience of comfort will improve Outcome: Progressing   

## 2023-08-25 NOTE — Progress Notes (Signed)
PROGRESS NOTE    ZINACHIMDI RILING  WUJ:[id number] DOB: November 25, 1950 DOA: 07/27/2023 PCP: [name], MD   Brief Narrative:  72 year old female with stage IV uterine cancer status post TAH and LSO with peritoneal carcinomatosis was on active chemotherapy as an outpatient, persistent A-fib on Xarelto, diabetes mellitus type 2, hypertension presented with persistent nausea and vomiting with intolerance to oral intake.  Hospital course complicated with refractory ileus due to carcinomatosis requiring TPN.   She had venting PEG placed on 07/31/2023.  On 08/03/2023, patient started bleeding from PEG tube insertion site along with large amount of bloody vomitus.  She underwent EGD on 08/23/2023 which showed oozing gastric ulcer with active bleeding at the site of PEG tube insertion.  She returned to the ICU intubated, sedated.   On 08/09/2023, PEG tube came out; NG tube placed for stomach decompression and suction.  She was extubated on 08/10/2023.  Palliative care and oncology, surgery were following.  All signed off.  She was made DNR after palliative care meeting on 08/11/2023.  She was transferred back to [facility] service from 08/13/2023 onwards.  10/28, patient very debilitated, requiring frequent pain medications.  Currently unable to keep up conversation. Goal of care discussion held, converted to comfort care and hospice.  Anticipate hospital death.   Subjective:  Patient seen and examined.  Mostly lethargic, unable to keep up any conversation.  Her daughter at the bedside.  2 of the sisters on the phone.  Patient occasionally pointed towards her NG tube and towards her belly and complaining of discomfort. Family updated, patient remains distressed with frequent need for pain medications and without evidence of clinical improvement.  Agreed to start patient on comfort care and hospice pathway.   Assessment & Plan:   Acute upper GI bleeding due to gastric ulcer/erosion at PEG tube  site Refractory ileus, intractable nausea and absence of bowel function. Unable to eat due to intractable nausea and vomiting, currently n.p.o. and on TPN. Uterine cancer with peritoneal carcinomatosis, exhausted treatment.  At end-of-life.  Plan: Comfort care and hospice pathway.  Provide end-of-life. Discontinue lab draws, discontinue TPN after current bag finishes. Focus on comfort Patient has been needing to use fentanyl every 2 hours for pain control and he still remains distressed, discussed with family that we will start patient on continuous infusion of fentanyl and increase dose to control her symptoms.  All other symptom control medications are available. Continue NG tube to suction, still has large output and this will consider comfort care. Chaplain to visit. Unrestricted visitor policy. RN to pronounce death if happens in the hospital. Patient never wanted to go to in-house hospice, anticipate providing end-of-life care in the hospital and in-hospital death.  DVT prophylaxis: Comfort care Code Status: Comfort care Family Communication: Daughter at the bedside.  2 of her sisters on the phone. Disposition Plan: Status is: Inpatient Remains inpatient appropriate because: Providing end-of-life.  Consultants: Oncology/palliative care/GI/PCCM/general surgery/IR  Procedures: As above  Antimicrobials:  Completed. Objective: Vitals:   08/24/23 2030 08/24/23 2319 08/25/23 0425 08/25/23 0621  BP: 130/76 132/72  115/73  Pulse: (!) 110 (!) 103  99  Resp: 20 20  20   Temp: 98.3 F (36.8 C) 97.6 F (36.4 C)  97.6 F (36.4 C)  TempSrc: Oral Oral  Oral  SpO2: 100% 99%  100%  Weight:   67 kg   Height:        Intake/Output Summary (Last 24 hours) at 08/25/2023 1256 Last data filed at 08/25/2023  8413 Gross per 24 hour  Intake 479.58 ml  Output 1775 ml  Net -1295.42 ml   Filed Weights   08/23/23 0500 08/24/23 0543 08/25/23 0425  Weight: 69.5 kg 70.7 kg 67 kg     Examination:  General: Sick looking.  Unable to have conversation.  Does not follow commands. Cardiovascular: S1-S2 normal.  Tachycardic. Respiratory: Bilateral poor air entry.  Poor inspiratory drive. Gastrointestinal: Mild generalized tenderness.  Gastrostomy site with bile leakage on bag.  Bowel sounds absent. NG tube with biliary drain. Ext: Trace bilateral edema. Neuro: Lethargic.  Barely responsive.  Not able to follow commands.  Unable to talk with muffled voice occasionally.      Data Reviewed: I have personally reviewed following labs and imaging studies  CBC: Recent Labs  Lab 08/20/23 0419 08/25/23 0329  WBC 12.9* 14.4*  NEUTROABS 9.9* 9.5*  HGB 9.0* 10.2*  HCT 29.6* 32.9*  MCV 97.7 96.8  PLT 323 350   Basic Metabolic Panel: Recent Labs  Lab 08/19/23 0359 08/20/23 0419 08/21/23 0555 08/22/23 0802 08/23/23 0138 08/24/23 0303 08/25/23 0329  NA 138 141 139 135 135 141 140  K 3.9 3.1* 2.8* 3.2* 3.7 4.2 4.3  CL 116* 120* 112* 107 108 111 110  CO2 16* 15* 18* 20* 19* 19* 19*  GLUCOSE 201* 188* 226* 205* 156* 180* 186*  BUN 112* 98* 89* 83* 84* 99* 99*  CREATININE 1.22* 1.16* 1.11* 1.15* 1.08* 1.49* 1.76*  CALCIUM 10.6* 10.8* 10.6* 10.1 10.0 11.0* 11.0*  MG 2.2 1.9 1.6* 2.1 2.2  --  2.8*  PHOS 3.7  --  1.7* 3.3 4.2  --  5.8*   GFR: Estimated Creatinine Clearance: 27.3 mL/min (A) (by C-G formula based on SCr of 1.76 mg/dL (H)). Liver Function Tests: Recent Labs  Lab 08/20/23 0419 08/21/23 0555 08/22/23 0802 08/23/23 0138 08/25/23 0329  AST 40 56* 44* 41 38  ALT 75* 93* 90* 88* 80*  ALKPHOS 455* 464* 425* 483* 548*  BILITOT 0.5 0.6 0.6 0.4 0.5  PROT 7.8 7.6 7.6 8.1 8.7*  ALBUMIN 2.8* 2.8* 2.8* 2.9* 3.3*   No results for input(s): "LIPASE", "AMYLASE" in the last 168 hours. No results for input(s): "AMMONIA" in the last 168 hours. Coagulation Profile: No results for input(s): "INR", "PROTIME" in the last 168 hours.  Cardiac Enzymes: No  results for input(s): "CKTOTAL", "CKMB", "CKMBINDEX", "TROPONINI" in the last 168 hours. BNP (last 3 results) No results for input(s): "PROBNP" in the last 8760 hours. HbA1C: No results for input(s): "HGBA1C" in the last 72 hours. CBG: Recent Labs  Lab 08/24/23 1221 08/24/23 1913 08/24/23 2315 08/25/23 0619 08/25/23 1121  GLUCAP 191* 187* 185* 207* 189*   Lipid Profile: Recent Labs    08/23/23 0138 08/25/23 0329  TRIG 191* 133    Thyroid Function Tests: No results for input(s): "TSH", "T4TOTAL", "FREET4", "T3FREE", "THYROIDAB" in the last 72 hours. Anemia Panel: No results for input(s): "VITAMINB12", "FOLATE", "FERRITIN", "TIBC", "IRON", "RETICCTPCT" in the last 72 hours. Sepsis Labs: No results for input(s): "PROCALCITON", "LATICACIDVEN" in the last 168 hours.   No results found for this or any previous visit (from the past 240 hour(s)).        Radiology Studies: No results found.      Scheduled Meds:   Continuous Infusions:  fentaNYL infusion INTRAVENOUS 25 mcg/hr (08/25/23 1124)          Dorcas Carrow, MD [facility] 08/25/2023, 12:56 PM

## 2023-08-25 NOTE — Progress Notes (Signed)
Chaplain offered prayer over Sherri Gill and her friend at bedside. Chaplain team continues to offer support and presence. Chaplain also alerted nurse to Sherri Gill's desire to be lifted in the bed.    08/25/23 1200  Spiritual Encounters  Type of Visit Follow up  Care provided to: Patient;Friend  Reason for visit End-of-life  Interventions  Spiritual Care Interventions Made Prayer  Intervention Outcomes  Outcomes Awareness of support;Connection to spiritual care

## 2023-08-25 NOTE — TOC Progression Note (Signed)
Transition of Care [facility]) - Progression Note    Patient Details  Name: Sherri Gill MRN: [id number] Date of Birth: Feb 15, 1951  Transition of Care [facility]) CM/SW Contact  Howell Rucks, RN Phone Number: 08/25/2023, 9:58 AM  Clinical Narrative:  Teams chat from attending this morning -changing to full comfort measures . initiate fentanyl infusion low doses . remain in hospital . TOC will continue to follow.      Expected Discharge Plan: Home/Self Care Barriers to Discharge: Continued Medical Work up  Expected Discharge Plan and Services In-house Referral: NA Discharge Planning Services: CM Consult Post Acute Care Choice: NA Living arrangements for the past 2 months: Apartment                 DME Arranged: N/A                     Social Determinants of Health (SDOH) Interventions SDOH Screenings   Food Insecurity: No Food Insecurity (07/14/2023)  Housing: Low Risk  (07/14/2023)  Transportation Needs: No Transportation Needs (07/14/2023)  Utilities: Not At Risk (07/14/2023)  Alcohol Screen: Low Risk  (02/20/2023)  Depression (PHQ2-9): Medium Risk (03/25/2023)  Financial Resource Strain: Medium Risk (02/20/2023)  Physical Activity: Inactive (02/20/2023)  Social Connections: Unknown (02/20/2023)  Stress: Stress Concern Present (02/20/2023)  Tobacco Use: Medium Risk (07/14/2023)    Readmission Risk Interventions    08/08/2023    6:50 PM 07/16/2023   11:28 AM  Readmission Risk Prevention Plan  Transportation Screening Complete Complete  PCP or Specialist Appt within 3-5 Days  Complete  HRI or Home Care Consult  Complete  Social Work Consult for Recovery Care Planning/Counseling  Complete  Palliative Care Screening  Not Applicable  Medication Review [employer]) Complete Referral to Pharmacy  PCP or Specialist appointment within 3-5 days of discharge Complete   HRI or Home Care Consult Complete   SW Recovery Care/Counseling Consult Complete   Skilled Nursing  Facility Not Applicable

## 2023-08-26 DIAGNOSIS — K311 Adult hypertrophic pyloric stenosis: Secondary | ICD-10-CM | POA: Diagnosis not present

## 2023-08-26 DIAGNOSIS — Z515 Encounter for palliative care: Secondary | ICD-10-CM | POA: Diagnosis not present

## 2023-08-26 DIAGNOSIS — K567 Ileus, unspecified: Secondary | ICD-10-CM | POA: Diagnosis not present

## 2023-08-26 MED ORDER — GLYCOPYRROLATE 0.2 MG/ML IJ SOLN: 0.4000 mg | INTRAMUSCULAR | Status: DC

## 2023-08-26 MED ORDER — SCOPOLAMINE 1 MG/3DAYS TD PT72
1.0000 | MEDICATED_PATCH | TRANSDERMAL | Status: DC
  Filled 2023-08-26: qty 1

## 2023-08-29 NOTE — Progress Notes (Signed)
Daily Progress Note   Patient Name: Sherri Gill       Date: 11-Sep-2023 DOB: 04-25-1951  Age: 72 y.o. MRN#: [id number] Attending Physician: Dorcas Carrow, MD Primary Care Physician: [name], MD Admit Date: 07/22/2023  Reason for Consultation/Follow-up: Establishing goals of care  Patient Profile/HPI:  72 y.o. female with past medical history of metastatic uterine cancer with peritoneal carcinomatosis on active chemotherapy/immunotherapy, paroxysmal atrial fibrillation on Xarelto, DM, and HTN who was admitted on 07/25/2023 with intractable nausea and vomiting. She was started on TPN due to refractory ileus. Palliative Medicine was consulted for goals of care.  On 10/11, patient had bleeding from PEG tube insertion site and hematemesis. She underwent urgent EGD and remained intubated after the procedure. Venting PEG came out on 10/12. Extubated on 10/13.  Patient had ongoing decline during admission and was transitioned to comfort measures only by attending on 10/26.  Subjective: Chart reviewed.  Evaluated patient.  Family at bedside.  Patient did not respond verbally to any questions. Eyes open, but she did not track.  Has significant terminal secretions.  Discussed symptom management with family. Answered their questions related to dying process, positioning.   Review of Systems  Unable to perform ROS: Acuity of condition     Physical Exam Vitals and nursing note reviewed.  Constitutional:      Appearance: She is ill-appearing.  HENT:     Nose:     Comments: NG in place to suction    Mouth/Throat:     Comments: Audible terminal secretions Cardiovascular:     Comments: Peripheral pulses weak Skin:    Comments: Extremities warm  Neurological:     Comments: Eyes  open, no tracking, no verbal or physical responses             Vital Signs: BP 118/67 (BP Location: Left Arm)   Pulse (!) 110   Temp 97.8 F (36.6 C) (Oral)   Resp 18   Ht 5\' 3"  (1.6 m)   Wt 67 kg   SpO2 95%   BMI 26.15 kg/m  SpO2: SpO2: 95 % O2 Device: O2 Device: Room Air (family/visitors at the bedside.) O2 Flow Rate: O2 Flow Rate (L/min): 2 L/min  Intake/output summary:  Intake/Output Summary (Last 24 hours) at 09-11-2023 1459 Last data filed at 09/11/23 1430 Gross  per 24 hour  Intake 204.56 ml  Output 1050 ml  Net -845.44 ml   LBM: Last BM Date : 08/22/23 Baseline Weight: Weight: 80.9 kg Most recent weight: Weight: 67 kg       Palliative Assessment/Data: PPS: 10%      Patient Active Problem List   Diagnosis Date Noted   Obesity (BMI 30-39.9) 08/01/2023   Thrush 07/30/2023   Hypothyroidism 07/30/2023   Pancytopenia (HCC) 07/30/2023   Malnutrition of moderate degree 07/23/2023   Refractory ileus due to carcinomatosis 07/06/2023   Cancer associated pain 06/20/2023   Vitamin B12 deficiency 06/20/2023   Other constipation 05/30/2023   Bilateral leg pain 05/02/2023   Bowel habit changes 04/11/2023   Elevated serum creatinine 02/28/2023   Bloating symptom 02/02/2023   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) 08/23/2022   Anemia due to antineoplastic chemotherapy 05/28/2022   PAF (paroxysmal atrial fibrillation) (HCC) onset post-op Spring 2023 04/12/2022   Hypomagnesemia 03/08/2022   Hypokalemia 02/14/2022   Chronic nausea 01/15/2022   Uterine cancer (HCC) 01/14/2022   Umbilical hernia 09/28/2021   Radicular pain in left arm 11/10/2020   Routine health maintenance 11/10/2020   History of colonic polyps 11/10/2020   Elevated alkaline phosphatase level 03/17/2016   Iron deficiency anemia 08/29/2012   Obesity (BMI 30.0-34.9) 06/11/2012   Dyslipidemia associated with type 2 diabetes mellitus (HCC) 06/30/2007   Major depressive disorder with single episode,  in remission (HCC) 06/30/2007   Essential hypertension 06/30/2007   GERD 06/30/2007   Type 2 diabetes mellitus with neurological complications (HCC) 06/30/2007    Palliative Care Assessment & Plan    Assessment/Recommendations/Plan  Comfort care-symptom management- continue current interventions, start robinul 0.4mg  q4hr scheduled, add scopolamine patch  Code Status: DNR  Prognosis:  Hours - Days  Discharge Planning: Anticipated Hospital Death  Care plan was discussed with patient's family  Thank you for allowing the Palliative Medicine Team to assist in the care of this patient.  Ocie Bob, AGNP-C Palliative Medicine   Please contact Palliative Medicine Team phone at [phone] for questions and concerns.

## 2023-08-29 NOTE — Death Summary Note (Signed)
DEATH SUMMARY   Patient Details  Name: Sherri Gill MRN: [id number] DOB: [birth year] QMV:[id number], Dorene Ar, MD Admission/Discharge Information   Admit Date:  2023/07/21  Date of Death: Date of Death: 02-Sep-2023  Time of Death: Time of Death: 09-23-24  Length of Stay: 96   Principle Cause of death: Metastatic uterine cancer  Hospital Diagnoses: Principal Problem:   Refractory ileus due to carcinomatosis Active Problems:   Uterine cancer [facility])   Essential hypertension   Type 2 diabetes mellitus with neurological complications (HCC)   Hypokalemia   PAF (paroxysmal atrial fibrillation) (HCC) onset post-op Spring 2023   Malnutrition of moderate degree   Thrush   Hypothyroidism   Pancytopenia (HCC)   Obesity (BMI 30-39.9)   Hospital Course:  71 year old female with stage IV uterine cancer status post TAH and LSO with peritoneal carcinomatosis was on active chemotherapy as an outpatient, persistent A-fib on Xarelto, diabetes mellitus type 2, hypertension presented with persistent nausea and vomiting with intolerance to oral intake.  Hospital course complicated with refractory ileus due to carcinomatosis requiring TPN.   She had venting PEG placed on 07/31/2023.  On 07/29/2023, patient started bleeding from PEG tube insertion site along with large amount of bloody vomitus.  She underwent EGD on 08/05/2023 which showed oozing gastric ulcer with active bleeding at the site of PEG tube insertion.  She returned to the ICU intubated, sedated.   On 08/09/2023, PEG tube came out; NG tube placed for stomach decompression and suction.  She was extubated on 08/10/2023.  Palliative care and oncology, surgery were following.  All signed off.  She was made DNR after palliative care meeting on 08/11/2023.  She was transferred back to [facility] service from 08/13/2023 onwards. Patient had complicated hospitalization.  She stayed in the hospital for more than 6 weeks.  Patient gradually decompensated and  remained with severe pain and disability, unable to eat. Patient was converted to comfort care and hospice on 10/28 and provided end-of-life care.  She died in the hospital under comfort care measures with family at the bedside.  Immediate cause of death ileus due to carcinomatosis secondary to metastatic uterine cancer        Procedures: Multiple procedures  Consultations: Multiple consultations.  Palliative, radiology, gastroenterology, surgery, critical care  The results of significant diagnostics from this hospitalization (including imaging, microbiology, ancillary and laboratory) are listed below for reference.   Significant Diagnostic Studies: DG Abd Portable 1V  Result Date: 08/16/2023 CLINICAL DATA:  Nasogastric tube placement. EXAM: PORTABLE ABDOMEN - 1 VIEW COMPARISON:  08/14/2023 FINDINGS: Nasogastric tube is seen with the tip in the distal gastric body. No evidence of dilated bowel loops. IMPRESSION: Nasogastric tube tip in distal gastric body. Electronically Signed   By: Danae Orleans M.D.   On: 08/16/2023 11:44   [place] EKG SITE RITE  Result Date: 08/15/2023 If Site Rite image not attached, placement could not be confirmed due to current cardiac rhythm.  DG Abd 1 View  Result Date: 08/14/2023 CLINICAL DATA:  NG tube placement EXAM: ABDOMEN - 1 VIEW COMPARISON:  08/09/2023, CT 08/09/2023 FINDINGS: Partially visualized right-sided port tip at the right atrium. Atypical course of esophageal tube with the tip directed to the patient's left, near T tacks previously noted on abdominal CT. Nonobstructed gas pattern IMPRESSION: Atypical course of esophageal tube with the tip directed to the patient's left, near T tacks previously noted on abdominal CT. Tip location is uncertain, question within previously noted gastrostomy tube tract. Recommend CT  to more definitively determine the position of the esophageal tube tip. These results will be called to the ordering clinician or  representative by the Radiologist Assistant, and communication documented in the PACS or [employer]. Electronically Signed   By: Jasmine Pang M.D.   On: 08/14/2023 15:28   DG Abd Portable 1V  Result Date: 08/09/2023 CLINICAL DATA:  Encounter for nasogastric tube placement. EXAM: PORTABLE ABDOMEN - 1 VIEW COMPARISON:  CT angio abdomen 08/09/2023 FINDINGS: Side port of the NG tube is in the stomach. Bowel gas pattern is unremarkable. The lung bases are clear. IMPRESSION: Side port of the NG tube is in the stomach. Electronically Signed   By: Marin Roberts M.D.   On: 08/09/2023 15:30   CT ANGIO ABDOMEN W &/OR WO CONTRAST  Result Date: 08/09/2023 CLINICAL DATA:  History of endometrial/uterine cancer. Head displacement. GI bleeding. * Tracking Code: BO * EXAM: CT ANGIOGRAPHY ABDOMEN TECHNIQUE: Multidetector CT imaging of the abdomen was performed using the standard protocol during bolus administration of intravenous contrast. Multiplanar reconstructed images and MIPs were obtained and reviewed to evaluate the vascular anatomy. RADIATION DOSE REDUCTION: This exam was performed according to the departmental dose-optimization program which includes automated exposure control, adjustment of the mA and/or kV according to patient size and/or use of iterative reconstruction technique. CONTRAST:  OMNIPAQUE IOHEXOL 350 MG/ML SOLN COMPARISON:  07/12/2023. FINDINGS: VASCULAR Aorta: Normal caliber aorta without aneurysm, dissection, vasculitis or significant stenosis. Mild aortic atherosclerotic calcifications. Celiac: Patent without evidence of aneurysm, dissection, vasculitis or significant stenosis. SMA: Patent without evidence of aneurysm, dissection, vasculitis or significant stenosis. Renals: Both renal arteries are patent without evidence of aneurysm, dissection, vasculitis, fibromuscular dysplasia or significant stenosis. Calcifications are noted at the origin of bilateral renal arteries. IMA:  Patent without evidence of aneurysm, dissection, vasculitis or significant stenosis. Inflow: Patent without evidence of aneurysm, dissection, vasculitis or significant stenosis. Veins: No obvious venous abnormality within the limitations of this arterial phase study. Review of the MIP images confirms the above findings. NON-VASCULAR Lower chest: Atelectasis is identified within both lung bases. Trace left pleural effusion. Hepatobiliary: No suspicious liver lesion identified. Recent gastrostomy tube tract is noted within the ventral abdomen through the far lateral aspect of the left hepatic lobe, image 37/8. Here there are 2 adjacent cervical trigger clips. Gallbladder appears within normal limits. No bile duct dilatation. Pancreas: Unremarkable. No pancreatic ductal dilatation or surrounding inflammatory changes. Spleen: Normal in size without focal abnormality. Adrenals/Urinary Tract: Normal adrenal glands. No nephrolithiasis, hydronephrosis or suspicious mass. Foley catheter identified within the bladder. No focal bladder abnormality. Stomach/Bowel: Two T-tacks are identified at the gastrostomy tube site along the anterior gastric wall, image 39/8. No signs of active intraluminal contrast extravasation into the stomach to indicate active gastric bleeding. Likewise, there is no definite intraluminal contrast extravasation within the large or small bowel loops to indicates active GI bleeding. Moderate retained stool is identified within the colon. Scattered colonic diverticula noted without signs of acute diverticulitis. No pathologic dilatation of the large or small bowel loops to suggest an obstruction. Multiple interloop small bowel fluid collections are identified as noted previously which are concerning for peritoneal disease. Lymphatic: Aortic atherosclerosis. No aneurysm. Upper abdominal vascularity appears patent. No signs of abdominopelvic adenopathy. Reproductive: Uterus is absent. Soft tissue nodule  associated with the left side of the vaginal cough is similar to the previous exam measuring 2.4 cm, image 74/8. Previously this measured the same. Other: Peritoneal soft tissue stranding and omental  stranding is again noted concerning for peritoneal disease. This appears similar to the previous study. As mentioned above there are multiple fluid density structures which appear closely associated with the wall of the small bowel loops, similar in appearance to the previous exam. These are concerning for possible peritoneal deposits. Index fluid density structure within the central mesentery measures 2.1 cm, image 57/8. Formally 2.3 cm. Fluid collection associated with a left abdominal small bowel loop measures 3.4 x 2.6 cm, image 52/8. Formally this measured 2.6 by 2.2 cm. There is mild fluid identified extending along the lefta pericolic gutter into the left retroperitoneal reflection which is new from previous study, image 62/8. Musculoskeletal: No acute or significant osseous findings. No acute or suspicious osseous findings. IMPRESSION: 1. No signs of active intraluminal contrast extravasation into the stomach to indicate active gastric bleeding. Likewise, there is no definite intraluminal contrast extravasation within the large or small bowel loops to indicates active GI bleeding. 2. Non-vascular. Peritoneal soft tissue stranding mild nodularity is again noted concerning for peritoneal disease. This appears similar to the previous exam. There are multiple fluid density structures which appear closely associated with the wall of the small bowel loops, similar in appearance to the previous exam. These are concerning for mucinous peritoneal deposits. 3. There is mild fluid identified extending along the left pericolic gutter into the left retroperitoneal reflection which is new from previous study. 4. Soft tissue nodule associated with the left side of the vaginal cough is similar to the previous exam. 5. Trace  left pleural effusion. 6.  Aortic Atherosclerosis (ICD10-I70.0). Electronically Signed   By: Signa Kell M.D.   On: 08/09/2023 13:46   DG Chest Port 1 View  Result Date: 08/09/2023 CLINICAL DATA:  Acute respiratory failure. EXAM: PORTABLE CHEST 1 VIEW COMPARISON:  07/30/2023 FINDINGS: Exam detail is diminished secondary to rotational artifact. There is an endotracheal to in place. The tip of the ET tube is at the level of the carina and is directed towards the right mainstem bronchus. Consider withdrawing by approximately 2.5 cm. Right chest wall port a catheter tip is in the projection of the right atrium. Unchanged. Stable cardiomediastinal contours. Lung volumes are low with asymmetric elevation of the right hemidiaphragm. Pulmonary vascular congestion noted. IMPRESSION: 1. Endotracheal tube tip is at the level of the carina and is directed towards the right mainstem bronchus. Consider withdrawing by approximately 2.5 cm. 2. Low lung volumes and pulmonary vascular congestion. These results will be called to the ordering clinician or representative by the Radiologist Assistant, and communication documented in the PACS or [employer]. Electronically Signed   By: Signa Kell M.D.   On: 08/09/2023 12:22   IR GASTROSTOMY TUBE MOD SED  Result Date: 07/31/2023 INDICATION: 72 year old female with history of progressive gynecologic malignancy and bowel obstruction requiring percutaneous gastrostomy access for venting purposes. EXAM: PERC PLACEMENT GASTROSTOMY MEDICATIONS: Ancef 2 gm IV; Antibiotics were administered within 1 hour of the procedure. ANESTHESIA/SEDATION: Versed 2 mg IV; Fentanyl 100 mcg IV Moderate Sedation Time:  12 The patient was continuously monitored during the procedure by the interventional radiology nurse under my direct supervision. CONTRAST:  20mL OMNIPAQUE IOHEXOL 300 MG/ML SOLN - administered into the gastric lumen. FLUOROSCOPY TIME:  Ten mGy COMPLICATIONS: None immediate.  PROCEDURE: Informed written consent was obtained from the patient after a thorough discussion of the procedural risks, benefits and alternatives. All questions were addressed. Maximal Sterile barrier Technique was utilized including caps, mask, sterile gowns, sterile gloves, sterile drape,  hand hygiene and skin antiseptic. A timeout was performed prior to the initiation of the procedure. The patient was placed on the procedure table in the supine position. Pre-procedure abdominal film confirmed visualization of the transverse colon. The patient was prepped and draped in usual sterile fashion. The stomach was insufflated with air via the indwelling nasogastric tube. Under fluoroscopy, a puncture site was selected and local analgesia achieved with 1% lidocaine infiltrated subcutaneously. Under fluoroscopic guidance, a gastropexy needle was passed into the stomach and the T-bar suture was released. Entry into the stomach was confirmed with fluoroscopy, aspiration of air, and injection of contrast material. This was repeated with an additional gastropexy suture (for a total of 2 fasteners). At the center of these gastropexy sutures, a dermatotomy was performed. An 18 gauge needle was passed into the stomach at the site of this dermatotomy, and position within the gastric lumen again confirmed under fluoroscopy using aspiration of air and contrast injection. An Amplatz guidewire was passed through this needle and intraluminal placement within the stomach was confirmed by fluoroscopy. The needle was removed. Over the guidewire, the percutaneous tract was dilated using a 10 mm non-compliant balloon. The balloon was deflated, then pushed into the gastric lumen followed in concert by the 20 Fr gastrostomy tube. The retention balloon of the percutaneous gastrostomy tube was inflated with 10 mL of sterile water. The tube was withdrawn until the retention balloon was at the edge of the gastric lumen. The external bumper was  brought to the abdominal wall. Contrast was injected through the gastrostomy tube, confirming intraluminal positioning. The patient tolerated the procedure well without any immediate post-procedural complications. IMPRESSION: Technically successful placement of 20 Fr gastrostomy tube. PLAN: Plan for routine 6 month gastrostomy tube exchange. Marliss Coots, MD [facility] Electronically Signed   By: Marliss Coots M.D.   On: 07/31/2023 11:26   DG Chest 2 View  Result Date: 07/30/2023 CLINICAL DATA:  Hospital-acquired pneumonia. EXAM: CHEST - 2 VIEW COMPARISON:  Chest/abdominal radiographs 07/08/2023 FINDINGS: A right jugular Port-A-Cath remains in place with tip near the superior cavoatrial junction. An enteric tube courses into the abdomen with tip not imaged and with side port near the GE junction. The cardiomediastinal silhouette is unchanged with normal heart size. Aortic atherosclerosis is noted. The lungs are hypoinflated. No confluent airspace opacity, edema, pleural effusion, or pneumothorax is identified. No acute osseous abnormality is seen. IMPRESSION: No active cardiopulmonary disease. Electronically Signed   By: Sebastian Ache M.D.   On: 07/30/2023 13:44   DG Abd 1 View  Result Date: 07/30/2023 CLINICAL DATA:  Nasogastric tube placement EXAM: ABDOMEN - 1 VIEW COMPARISON:  07/27/2023 FINDINGS: Enteric tube with tip at the stomach which appears decompressed. No gas dilated bowel is seen, there is partial coverage of the right abdomen. Rounded densities over the left flank are within colonic diverticula based on prior CT. IMPRESSION: Enteric tube with tip at the stomach. Electronically Signed   By: Tiburcio Pea M.D.   On: 07/30/2023 04:36    Microbiology: No results found for this or any previous visit (from the past 240 hour(s)).  Time spent: 0 minutes  Signed: Dorcas Carrow, MD 08/02/2023

## 2023-08-29 NOTE — Progress Notes (Signed)
PROGRESS NOTE    Sherri Gill  HKV:[id number] DOB: 1951-06-27 DOA: 08-11-2023 PCP: [name], MD   Brief Narrative:  72 year old female with stage IV uterine cancer status post TAH and LSO with peritoneal carcinomatosis was on active chemotherapy as an outpatient, persistent A-fib on Xarelto, diabetes mellitus type 2, hypertension presented with persistent nausea and vomiting with intolerance to oral intake.  Hospital course complicated with refractory ileus due to carcinomatosis requiring TPN.   She had venting PEG placed on 07/31/2023.  On 08/23/2023, patient started bleeding from PEG tube insertion site along with large amount of bloody vomitus.  She underwent EGD on 08/19/2023 which showed oozing gastric ulcer with active bleeding at the site of PEG tube insertion.  She returned to the ICU intubated, sedated.   On 08/09/2023, PEG tube came out; NG tube placed for stomach decompression and suction.  She was extubated on 08/10/2023.  Palliative care and oncology, surgery were following.  All signed off.  She was made DNR after palliative care meeting on 08/11/2023.  She was transferred back to [facility] service from 08/13/2023 onwards.  10/28,  Comfort care and hospice.  On escalating need of symptom management medications.  Subjective:  Patient seen in the morning rounds.  Her daughter and sister were at the bedside.  Patient is unable to communicate.  She had required frequent as needed fentanyl overnight.  Currently on fentanyl 175 mcg/h infusion.  She has a lot of airway sounds and occasional discomfort due to coughing. Remains obtunded. Will need to more medications.   Assessment & Plan:   Uterine cancer with peritoneal carcinomatosis  Failure to thrive  Refractory ileus, intractable nausea and intractable cancer related pain  Upper GI bleeding  End-of-life care  Plan: Comfort care and hospice pathway.  Provide end-of-life. Today on fentanyl 175 mcg/h, she will need  further escalation of dose.  Will add more symptom control medications.  Robaxin.  Okay to suction for comfort. Continue NG tube drainage. Unrestricted visitor policy. RN to pronounce death if happens in the hospital. Patient never wanted to go to in-house hospice, anticipate providing end-of-life care in the hospital and in-hospital death.    DVT prophylaxis: Comfort care Code Status: Comfort care Family Communication: Daughter at the bedside.  Sister at the bedside.  Disposition Plan: Status is: Inpatient Remains inpatient appropriate because: Providing end-of-life.  Consultants: Oncology/palliative care/GI/PCCM/general surgery/IR  Procedures: As above  Antimicrobials:  Completed. Objective: Vitals:   08/18/2023 0216 08/11/2023 0535 08/18/2023 0537 08/23/2023 1143  BP:   118/67   Pulse:   (!) 110   Resp: (!) 9 (!) 9 10 18   Temp:   97.8 F (36.6 C)   TempSrc:   Oral   SpO2:   95%   Weight:      Height:        Intake/Output Summary (Last 24 hours) at 08/03/2023 1346 Last data filed at 08/18/2023 1258 Gross per 24 hour  Intake 184.56 ml  Output 1050 ml  Net -865.44 ml   Filed Weights   08/23/23 0500 08/24/23 0543 08/25/23 0425  Weight: 69.5 kg 70.7 kg 67 kg    Examination:  Sick looking.  Obtunded.  Occasionally looks uncomfortable.  Audible respiratory noise.      Data Reviewed: I have personally reviewed following labs and imaging studies  CBC: Recent Labs  Lab 08/20/23 0419 08/25/23 0329  WBC 12.9* 14.4*  NEUTROABS 9.9* 9.5*  HGB 9.0* 10.2*  HCT 29.6* 32.9*  MCV 97.7 96.8  PLT 323 350   Basic Metabolic Panel: Recent Labs  Lab 08/20/23 0419 08/21/23 0555 08/22/23 0802 08/23/23 0138 08/24/23 0303 08/25/23 0329  NA 141 139 135 135 141 140  K 3.1* 2.8* 3.2* 3.7 4.2 4.3  CL 120* 112* 107 108 111 110  CO2 15* 18* 20* 19* 19* 19*  GLUCOSE 188* 226* 205* 156* 180* 186*  BUN 98* 89* 83* 84* 99* 99*  CREATININE 1.16* 1.11* 1.15* 1.08* 1.49* 1.76*   CALCIUM 10.8* 10.6* 10.1 10.0 11.0* 11.0*  MG 1.9 1.6* 2.1 2.2  --  2.8*  PHOS  --  1.7* 3.3 4.2  --  5.8*   GFR: Estimated Creatinine Clearance: 27.3 mL/min (A) (by C-G formula based on SCr of 1.76 mg/dL (H)). Liver Function Tests: Recent Labs  Lab 08/20/23 0419 08/21/23 0555 08/22/23 0802 08/23/23 0138 08/25/23 0329  AST 40 56* 44* 41 38  ALT 75* 93* 90* 88* 80*  ALKPHOS 455* 464* 425* 483* 548*  BILITOT 0.5 0.6 0.6 0.4 0.5  PROT 7.8 7.6 7.6 8.1 8.7*  ALBUMIN 2.8* 2.8* 2.8* 2.9* 3.3*   No results for input(s): "LIPASE", "AMYLASE" in the last 168 hours. No results for input(s): "AMMONIA" in the last 168 hours. Coagulation Profile: No results for input(s): "INR", "PROTIME" in the last 168 hours.  Cardiac Enzymes: No results for input(s): "CKTOTAL", "CKMB", "CKMBINDEX", "TROPONINI" in the last 168 hours. BNP (last 3 results) No results for input(s): "PROBNP" in the last 8760 hours. HbA1C: No results for input(s): "HGBA1C" in the last 72 hours. CBG: Recent Labs  Lab 08/24/23 1221 08/24/23 1913 08/24/23 2315 08/25/23 0619 08/25/23 1121  GLUCAP 191* 187* 185* 207* 189*   Lipid Profile: Recent Labs    08/25/23 0329  TRIG 133    Thyroid Function Tests: No results for input(s): "TSH", "T4TOTAL", "FREET4", "T3FREE", "THYROIDAB" in the last 72 hours. Anemia Panel: No results for input(s): "VITAMINB12", "FOLATE", "FERRITIN", "TIBC", "IRON", "RETICCTPCT" in the last 72 hours. Sepsis Labs: No results for input(s): "PROCALCITON", "LATICACIDVEN" in the last 168 hours.   No results found for this or any previous visit (from the past 240 hour(s)).        Radiology Studies: No results found.      Scheduled Meds:   Continuous Infusions:  fentaNYL infusion INTRAVENOUS 175 mcg/hr (08/15/2023 0951)          Dorcas Carrow, MD [facility] 07/30/2023, 1:46 PM

## 2023-08-29 NOTE — Progress Notes (Signed)
Chaplain engaged in a follow-up visit with Telesa and her family at bedside. Chaplain checked in with family and was able to say a blessing over [place].      1600  Spiritual Encounters  Type of Visit Follow up  Care provided to: Pt and family  Reason for visit End-of-life

## 2023-08-29 DEATH — deceased

## 2023-09-12 ENCOUNTER — Other Ambulatory Visit: Payer: Medicare HMO

## 2023-09-12 ENCOUNTER — Ambulatory Visit: Payer: Medicare HMO | Admitting: Hematology and Oncology

## 2023-09-12 ENCOUNTER — Ambulatory Visit: Payer: Medicare HMO
